# Patient Record
Sex: Female | Born: 1955
Health system: Southern US, Community
[De-identification: ages and names within clinical notes are randomized; demographics above are authoritative.]

## PROBLEM LIST (undated history)

## (undated) DIAGNOSIS — T8859XA Other complications of anesthesia, initial encounter: Secondary | ICD-10-CM

## (undated) DIAGNOSIS — I10 Essential (primary) hypertension: Secondary | ICD-10-CM

## (undated) DIAGNOSIS — G709 Myoneural disorder, unspecified: Secondary | ICD-10-CM

## (undated) DIAGNOSIS — Z8719 Personal history of other diseases of the digestive system: Secondary | ICD-10-CM

## (undated) DIAGNOSIS — N189 Chronic kidney disease, unspecified: Secondary | ICD-10-CM

## (undated) DIAGNOSIS — C569 Malignant neoplasm of unspecified ovary: Secondary | ICD-10-CM

## (undated) DIAGNOSIS — R112 Nausea with vomiting, unspecified: Secondary | ICD-10-CM

## (undated) DIAGNOSIS — K219 Gastro-esophageal reflux disease without esophagitis: Secondary | ICD-10-CM

## (undated) DIAGNOSIS — Z9221 Personal history of antineoplastic chemotherapy: Secondary | ICD-10-CM

## (undated) DIAGNOSIS — Z9889 Other specified postprocedural states: Secondary | ICD-10-CM

## (undated) DIAGNOSIS — Z87442 Personal history of urinary calculi: Secondary | ICD-10-CM

## (undated) DIAGNOSIS — T4145XA Adverse effect of unspecified anesthetic, initial encounter: Secondary | ICD-10-CM

## (undated) HISTORY — DX: Malignant neoplasm of unspecified ovary: C56.9

## (undated) HISTORY — PX: HIATAL HERNIA REPAIR: SHX195

## (undated) HISTORY — DX: Essential (primary) hypertension: I10

## (undated) HISTORY — PX: OTHER SURGICAL HISTORY: SHX169

---

## 1997-08-08 ENCOUNTER — Ambulatory Visit (HOSPITAL_COMMUNITY): Admission: RE | Admit: 1997-08-08 | Discharge: 1997-08-08 | Payer: Self-pay | Admitting: Gastroenterology

## 1999-08-30 ENCOUNTER — Encounter: Admission: RE | Admit: 1999-08-30 | Discharge: 1999-08-30 | Payer: Self-pay | Admitting: General Surgery

## 1999-08-30 ENCOUNTER — Encounter: Payer: Self-pay | Admitting: General Surgery

## 2002-02-11 ENCOUNTER — Ambulatory Visit (HOSPITAL_BASED_OUTPATIENT_CLINIC_OR_DEPARTMENT_OTHER): Admission: RE | Admit: 2002-02-11 | Discharge: 2002-02-11 | Payer: Self-pay | Admitting: Oral Surgery

## 2005-02-02 ENCOUNTER — Emergency Department (HOSPITAL_COMMUNITY): Admission: EM | Admit: 2005-02-02 | Discharge: 2005-02-02 | Payer: Self-pay | Admitting: Family Medicine

## 2005-08-23 ENCOUNTER — Ambulatory Visit (HOSPITAL_BASED_OUTPATIENT_CLINIC_OR_DEPARTMENT_OTHER): Admission: RE | Admit: 2005-08-23 | Discharge: 2005-08-23 | Payer: Self-pay | Admitting: Orthopedic Surgery

## 2005-08-23 ENCOUNTER — Encounter (INDEPENDENT_AMBULATORY_CARE_PROVIDER_SITE_OTHER): Payer: Self-pay | Admitting: Specialist

## 2009-01-05 ENCOUNTER — Ambulatory Visit (HOSPITAL_BASED_OUTPATIENT_CLINIC_OR_DEPARTMENT_OTHER): Admission: RE | Admit: 2009-01-05 | Discharge: 2009-01-05 | Payer: Self-pay | Admitting: Orthopedic Surgery

## 2009-01-07 ENCOUNTER — Emergency Department (HOSPITAL_COMMUNITY): Admission: EM | Admit: 2009-01-07 | Discharge: 2009-01-07 | Payer: Self-pay | Admitting: Emergency Medicine

## 2009-03-01 ENCOUNTER — Emergency Department (HOSPITAL_BASED_OUTPATIENT_CLINIC_OR_DEPARTMENT_OTHER): Admission: EM | Admit: 2009-03-01 | Discharge: 2009-03-01 | Payer: Self-pay | Admitting: Emergency Medicine

## 2010-07-27 LAB — POCT HEMOGLOBIN-HEMACUE: Hemoglobin: 14.7 g/dL (ref 12.0–15.0)

## 2010-09-07 NOTE — Op Note (Signed)
[name], Holly [name]                           ACCOUNT NO.:  [id number]   MEDICAL RECORD NO.:  [id number]                   PATIENT TYPE:  AMB   LOCATION:  DSC                                  FACILITY:  [facility]   PHYSICIAN:  Dora Sims, M.D.               DATE OF BIRTH:  [birth year]   DATE OF PROCEDURE:  02/11/2002  DATE OF DISCHARGE:  02/11/2002                                 OPERATIVE REPORT   PREOPERATIVE DIAGNOSES:  Left temporomandibular joint (TMJ) disk  dislocation.   POSTOPERATIVE DIAGNOSES:  Left temporomandibular joint (TMJ) disk  dislocation.   OPERATION PERFORMED:  Left TMJ arthroscopy, lysis and lavage.   SURGEON:  Dora Sims, D.D.S., M.D.   ANESTHESIA:  General endotracheal tube.   INDICATIONS FOR PROCEDURE:  This 55 year old lady was initially seen in my  office for evaluation of left temporomandibular joint pain.  She was placed  on conservative therapy and had persistent open lock event.  She underwent  an arthrocentesis in the office and approximately nine weeks after that  intervention, she had a series of open lock events.  The patient was  consulted and decision was made to bring the patient to the operating room  for evaluation under arthroscopic examination.   DESCRIPTION OF PROCEDURE:  The patient was maintained n.p.o. the night  before surgery, brought to the operating room, placed in the supine  position.  All anesthesia monitors were found to be working appropriately.  The patient was placed in supine position and all pressure points were  adequately padded.  She was nasotracheally intubated with minimal  difficulty.  This was confirmed by clear bilateral breath sounds as well as  positive end tidal CO2.  Once this was done, the left side of the patient's  face was marked, prepped and draped in the normal sterile fashion.  An entry  port was marked approximately 10 cc anterior to the central part of the  tragus that corresponded to the  lateral canthus of the eye and then a second  port for the instrumentation, approximately 7 mm toward the lateral canthus.  Once the joint space was entered, exploration was done.  There was found to  be a mild to moderate degree of inflammatory synovitis.  There was mobility  of the mandible and good mobilization of the articular disk.  There were no  perforations in the disk and all adhesions were lysed.  Joint space was  lavaged with copious amounts of normal saline irrigation.  Due to some  technical difficulties with the arthroscopy set and the cannulas, a synovial  shaver was elected not to be used during this procedure.  The patient had  excellent mobility of the joint and the disk was adequately freed up with  good mobilization.  Two 6-0 Prolene sutures were placed at each stab  incision and a Band-Aid was placed over both entry ports.  The patient  tolerated the procedure well.  Minimal blood loss.  No drains were placed.  Nothing was sent for pathology.  The patient will be given a Therabite  physical therapy device in order to work on her range of motion,  postoperatively and will be seen in my office on a regular basis until  stabilization of her left temporomandibular joint.                                                 Dora Sims, M.D.   RJR/MEDQ  D:  02/18/2002  T:  02/18/2002  Job:  [id number]

## 2010-09-07 NOTE — Op Note (Signed)
NAMEZINEB, GLADE                 ACCOUNT NO.:  [id number]   MEDICAL RECORD NO.:  [id number]          PATIENT TYPE:  AMB   LOCATION:  DSC                          FACILITY:  [facility]   PHYSICIAN:  Katy Fitch. Sypher, M.D. DATE OF BIRTH:  30-Apr-1955   DATE OF PROCEDURE:  08/23/2005  DATE OF DISCHARGE:                                 OPERATIVE REPORT   PREOPERATIVE DIAGNOSIS:  Painful mass right ring finger distal  interphalangeal flexion crease ulnar aspect.   POSTOPERATIVE DIAGNOSIS:  Question thrombosed varicose vein versus vascular  tumor of pulp/distal interphalangeal flexion crease region of right ring  finger.   OPERATIONS:  Excisional biopsy of vascular mass.   OPERATIONS:  Dr. Josephine Igo.   ASSISTANT:  Annye Rusk, PA-C.   ANESTHESIA:  General sedation/2% lidocaine metacarpal head level block of  right ring finger. Supervising anesthesiologist Dr. Sampson Goon.   INDICATIONS:  Holly [name] is a 55 year old woman employed at [employer].   She was referred by her family physician, Dr. Manus Gunning for evaluation and  management of a mass on the volar aspect of her right ring finger that was  painful right at the DIP flexion crease.   Clinical examination suggested a possible flexor sheath ganglion versus  other predicament.   She requested excision.   After informed consent, she is brought to the operating room at this time.   Re-examination of her finger in the holding area revealed that the mass  appeared to be somewhat smaller but was still quite painful.  She requested  that we proceed.   After informed consent, she is brought to the operating room at this time.   PROCEDURE:  Skylie Hiott is brought to the operating room and placed in  supine position on the operating table.   Following informed consent by Dr. Sampson Goon, Diprivan anesthesia was  provided followed by application of a 2% lidocaine metacarpal head and  flexor sheath block of the right ring  finger.   The right arm was then prepped with Betadine soap and solution, and  sterilely draped.   The right ring finger was then exsanguinated with a gauze wrap and a 1/2-  inch Penrose drain was placed over the proximal phalangeal segment as a  digital tourniquet.   The procedure commenced with a Brunner type incision splitting the proximal  pulp and extending into an ulnar direction at the DIP flexion crease.   The subcutaneous tissue was carefully divided immediately revealing a  purplish black mass that measured approximately 2 x 3 mm.  This appeared to  be growing on the venous plexus of the pulp superficial to the flexor  sheath.   This was circumferentially dissected and removed with a margin of normal-  appearing vasculature.   The mass was passed off in formalin for pathologic evaluation.   The wound was then irrigated and repaired with interrupted suture of 5-0  nylon.   A finger dressing was applied with Xeroflo sterile gauze and Coban.  There  were no apparent complications.   For aftercare, Ms. Criscuolo is provided a prescription Darvocet-N  100 1 p.o. q  4-6 hours p.r.n. pain, 20 tablets without refill.      Katy Fitch Sypher, M.D.  Electronically Signed     RVS/MEDQ  D:  08/23/2005  T:  08/23/2005  Job:  [id number]

## 2011-03-01 ENCOUNTER — Other Ambulatory Visit: Payer: Self-pay | Admitting: Sports Medicine

## 2011-03-01 DIAGNOSIS — S63601A Unspecified sprain of right thumb, initial encounter: Secondary | ICD-10-CM

## 2011-03-11 ENCOUNTER — Ambulatory Visit
Admission: RE | Admit: 2011-03-11 | Discharge: 2011-03-11 | Disposition: A | Discharge status: Home / Self Care | Payer: 59 | Source: Ambulatory Visit | Attending: Sports Medicine | Admitting: Sports Medicine

## 2011-03-11 DIAGNOSIS — S63601A Unspecified sprain of right thumb, initial encounter: Secondary | ICD-10-CM

## 2011-03-11 MED ORDER — IOHEXOL 180 MG/ML  SOLN
1.0000 mL | Freq: Once | INTRAMUSCULAR | Status: AC | PRN
  Administered 2011-03-11: 1 mL via INTRAVENOUS

## 2014-08-25 ENCOUNTER — Encounter (HOSPITAL_COMMUNITY): Payer: Self-pay | Admitting: Emergency Medicine

## 2014-08-25 ENCOUNTER — Emergency Department (HOSPITAL_COMMUNITY)
Admission: EM | Admit: 2014-08-25 | Discharge: 2014-08-25 | Disposition: A | Discharge status: Home / Self Care | Payer: 59 | Source: Home / Self Care

## 2014-08-25 DIAGNOSIS — H6091 Unspecified otitis externa, right ear: Secondary | ICD-10-CM

## 2014-08-25 MED ORDER — CIPROFLOXACIN-DEXAMETHASONE 0.3-0.1 % OT SUSP: 4.0000 [drp] | Freq: Two times a day (BID) | OTIC | Status: DC

## 2014-08-25 NOTE — Discharge Instructions (Signed)
Your ear pain is likely from irritation and possible infection of the external ear canal. Some of this discomfort from persistent wax buildup in the ear. Please start the antibiotic drops as prescribed. You can also consider using a one-to-one mixture of hydrogen peroxide and water to clean out her ears on a regular basis.

## 2014-08-25 NOTE — ED Notes (Signed)
Pt states that her right ear has been hurting since this morning and she states that last time her ear was this painful she had a ear infection

## 2014-08-25 NOTE — ED Provider Notes (Signed)
CSN: [id number]     Arrival date & time 08/25/14  1311 History   None    Chief Complaint  Patient presents with  . Otalgia   (Consider location/radiation/quality/duration/timing/severity/associated sxs/prior Treatment) HPI  R ear pain. Started this morning when pt woke from bed. Dull ache. Unusual for pt to have these symptoms. Uses debrox regularly for cerumen impaction. Tylenol w/ only 2 hrs of relief. Symptoms are constant and getting worse. Denies fevers, hearing loss, headache, nuchal rigidity, rash, nausea, vomiting, chest pain, shortness breath, palpitations, rash.   History reviewed. No pertinent past medical history. History reviewed. No pertinent past surgical history. History reviewed. No pertinent family history. History  Substance Use Topics  . Smoking status: Never Smoker   . Smokeless tobacco: Not on file  . Alcohol Use: No   OB History    No data available     Review of Systems Per HPI with all other pertinent systems negative.   Allergies  Sudafed  Home Medications   Prior to Admission medications   Medication Sig Start Date End Date Taking? Authorizing Provider  ciprofloxacin-dexamethasone (CIPRODEX) otic suspension Place 4 drops into the right ear 2 (two) times daily. 08/25/14   Waldemar Dickens, MD   BP 130/84 mmHg  Pulse 63  Temp(Src) 98.2 F (36.8 C) (Oral)  Resp 14  SpO2 96% Physical Exam Physical Exam  Constitutional: oriented to person, place, and time. appears well-developed and well-nourished. No distress.  HENT:  Right and left TMs normal. Right external canal with substantial amount of cerumen throughout the canal and canal erythema from the 7 to 11:00 positions. Head: Normocephalic and atraumatic.  Eyes: EOMI. PERRL.  Neck: Normal range of motion.  Cardiovascular: RRR, no m/r/g, 2+ distal pulses,  Pulmonary/Chest: Effort normal and breath sounds normal. No respiratory distress.  Abdominal: Soft. Bowel sounds are normal. NonTTP, no  distension.  Musculoskeletal: Normal range of motion. Non ttp, no effusion.  Neurological: alert and oriented to person, place, and time.  Skin: Skin is warm. No rash noted. non diaphoretic.  Psychiatric: normal mood and affect. behavior is normal. Judgment and thought content normal.    ED Course  Procedures (including critical care time) Labs Review Labs Reviewed - No data to display  Imaging Review No results found.   MDM   1. Otitis externa, right    Ciprodex, ibuprofen.  Waldemar Dickens, MD 08/25/14 [phone]

## 2016-04-22 HISTORY — PX: ABDOMINAL HYSTERECTOMY: SHX81

## 2016-08-09 ENCOUNTER — Other Ambulatory Visit (HOSPITAL_COMMUNITY): Payer: Self-pay | Admitting: Emergency Medicine

## 2016-08-12 ENCOUNTER — Ambulatory Visit (HOSPITAL_COMMUNITY)
Admission: EM | Admit: 2016-08-12 | Discharge: 2016-08-12 | Disposition: A | Discharge status: Home / Self Care | Payer: 59 | Attending: Family Medicine | Admitting: Family Medicine

## 2016-08-12 ENCOUNTER — Ambulatory Visit (INDEPENDENT_AMBULATORY_CARE_PROVIDER_SITE_OTHER): Payer: 59

## 2016-08-12 ENCOUNTER — Encounter (HOSPITAL_COMMUNITY): Payer: Self-pay | Admitting: Emergency Medicine

## 2016-08-12 DIAGNOSIS — K5900 Constipation, unspecified: Secondary | ICD-10-CM

## 2016-08-12 DIAGNOSIS — R252 Cramp and spasm: Secondary | ICD-10-CM | POA: Diagnosis not present

## 2016-08-12 DIAGNOSIS — R1030 Lower abdominal pain, unspecified: Secondary | ICD-10-CM | POA: Diagnosis not present

## 2016-08-12 LAB — POCT URINALYSIS DIP (DEVICE)
Bilirubin Urine: NEGATIVE
Glucose, UA: NEGATIVE mg/dL
Ketones, ur: 15 mg/dL — AB
Leukocytes, UA: NEGATIVE
NITRITE: NEGATIVE
PROTEIN: NEGATIVE mg/dL
Specific Gravity, Urine: 1.015 (ref 1.005–1.030)
UROBILINOGEN UA: 0.2 mg/dL (ref 0.0–1.0)
pH: 6 (ref 5.0–8.0)

## 2016-08-12 MED ORDER — POLYETHYLENE GLYCOL 3350 17 GM/SCOOP PO POWD
1.0000 | Freq: Once | ORAL | 0 refills | Status: AC
Start: 2016-08-12 — End: 2016-08-12

## 2016-08-12 NOTE — ED Triage Notes (Signed)
The patient presented to the [facility] with a complaint of abdominal cramping x 1 day.

## 2016-08-12 NOTE — ED Provider Notes (Signed)
[facility]    CSN: [id number] Arrival date & time: 08/12/16  1843     History   Chief Complaint Chief Complaint  Patient presents with  . Abdominal Cramping    HPI Holly [name] is a 61 y.o. female.   The patient presented to the [facility] with a complaint of abdominal cramping x 1 day.  Patient has had some constipation type symptoms with bloating and decreased frequency of bowel movements for couple weeks. She's had migrating discomfort in her abdomen during this time. Today the pain was a little bit worse all day although it has fluctuated.  Patient's had no urinary symptoms. She's had no vaginal bleeding. She's had no fever. The only abdominal procedure patient has had involved the hiatal hernia correction years ago      History reviewed. No pertinent past medical history.  There are no active problems to display for this patient.   History reviewed. No pertinent surgical history.  OB History    No data available       Home Medications    Prior to Admission medications   Not on File    Family History History reviewed. No pertinent family history.  Social History Social History  Substance Use Topics  . Smoking status: Never Smoker  . Smokeless tobacco: Not on file  . Alcohol use No     Allergies   Sudafed [pseudoephedrine hcl]   Review of Systems Review of Systems  HENT: Negative.   Respiratory: Negative.   Cardiovascular: Negative.   Gastrointestinal: Positive for abdominal distention and abdominal pain.  Genitourinary: Negative.   All other systems reviewed and are negative.    Physical Exam Triage Vital Signs ED Triage Vitals  Enc Vitals Group     BP 08/12/16 1848 (!) 179/59     Pulse Rate 08/12/16 1848 95     Resp 08/12/16 1848 20     Temp 08/12/16 1848 98.8 F (37.1 C)     Temp Source 08/12/16 1848 Oral     SpO2 08/12/16 1848 97 %     Weight --      Height --      Head Circumference --      Peak Flow --      Pain  Score 08/12/16 1846 3     Pain Loc --      Pain Edu? --      Excl. in [place]? --    No data found.   Updated Vital Signs BP (!) 179/59 (BP Location: Right Wrist)   Pulse 95   Temp 98.8 F (37.1 C) (Oral)   Resp 20   SpO2 97%    Physical Exam  Constitutional: She is oriented to person, place, and time. She appears well-developed and well-nourished.  HENT:  Head: Normocephalic.  Right Ear: External ear normal.  Left Ear: External ear normal.  Mouth/Throat: Oropharynx is clear and moist.  Eyes: Conjunctivae and EOM are normal. Pupils are equal, round, and reactive to light.  Neck: Normal range of motion. Neck supple.  Pulmonary/Chest: Effort normal.  Abdominal: Soft. There is tenderness.  Mild suprapubic tenderness with deep palpation No masses or HSM No guarding or rebound  Musculoskeletal: Normal range of motion.  Neurological: She is alert and oriented to person, place, and time.  Skin: Skin is warm and dry.  Nursing note and vitals reviewed.    UC Treatments / Results  Labs (all labs ordered are listed, but only abnormal results are displayed) Labs  Reviewed  POCT URINALYSIS DIP (DEVICE) - Abnormal; Notable for the following:       Result Value   Ketones, ur 15 (*)    Hgb urine dipstick MODERATE (*)    All other components within normal limits    EKG  EKG Interpretation None       Radiology Dg Abd 1 View  Result Date: 08/12/2016 CLINICAL DATA:  Lower abdominal pain and cramping for the past 3 weeks. History of hernia repair. EXAM: ABDOMEN - 1 VIEW COMPARISON:  None. FINDINGS: Moderate colonic stool burden without definite evidence of enteric obstruction. No supine evidence of pneumoperitoneum. No definite pneumatosis or portal venous gas. Limited visualization of the lower thorax is normal. Punctate phleboliths overlie the lower pelvis bilaterally. Otherwise, no definitive abnormal intra-abdominal calcifications. No definite acute osseus abnormalities.  IMPRESSION: Moderate colonic stool burden without evidence of enteric obstruction. Electronically Signed   By: Sandi Mariscal M.D.   On: 08/12/2016 20:15    Procedures Procedures (including critical care time)  Medications Ordered in UC Medications - No data to display   Initial Impression / Assessment and Plan / UC Course  I have reviewed the triage vital signs and the nursing notes.  Pertinent labs & imaging results that were available during my care of the patient were reviewed by me and considered in my medical decision making (see chart for details).     Final Clinical Impressions(s) / UC Diagnoses   Final diagnoses:  None    New Prescriptions New Prescriptions   No medications on file     Robyn Haber, MD 08/12/16 2050

## 2016-11-07 ENCOUNTER — Other Ambulatory Visit: Payer: Self-pay | Admitting: Obstetrics and Gynecology

## 2016-11-07 ENCOUNTER — Other Ambulatory Visit (HOSPITAL_COMMUNITY)
Admission: RE | Admit: 2016-11-07 | Discharge: 2016-11-07 | Disposition: A | Discharge status: Home / Self Care | Payer: 59 | Source: Ambulatory Visit | Attending: Obstetrics and Gynecology | Admitting: Obstetrics and Gynecology

## 2016-11-07 DIAGNOSIS — Z124 Encounter for screening for malignant neoplasm of cervix: Secondary | ICD-10-CM | POA: Insufficient documentation

## 2016-11-11 LAB — CYTOLOGY - PAP
ADEQUACY: ABSENT
DIAGNOSIS: NEGATIVE
HPV: NOT DETECTED

## 2016-12-04 ENCOUNTER — Other Ambulatory Visit: Payer: Self-pay | Admitting: Obstetrics and Gynecology

## 2016-12-04 DIAGNOSIS — R1900 Intra-abdominal and pelvic swelling, mass and lump, unspecified site: Secondary | ICD-10-CM

## 2016-12-05 ENCOUNTER — Ambulatory Visit
Admission: RE | Admit: 2016-12-05 | Discharge: 2016-12-05 | Disposition: A | Discharge status: Home / Self Care | Payer: 59 | Source: Ambulatory Visit | Attending: Obstetrics and Gynecology | Admitting: Obstetrics and Gynecology

## 2016-12-05 ENCOUNTER — Other Ambulatory Visit: Payer: Self-pay | Admitting: Obstetrics and Gynecology

## 2016-12-05 DIAGNOSIS — R1900 Intra-abdominal and pelvic swelling, mass and lump, unspecified site: Secondary | ICD-10-CM

## 2016-12-10 ENCOUNTER — Telehealth: Payer: Self-pay | Admitting: Gynecologic Oncology

## 2016-12-10 ENCOUNTER — Encounter: Payer: Self-pay | Admitting: Gynecologic Oncology

## 2016-12-10 NOTE — Telephone Encounter (Signed)
Appt has been scheduled for the pt to see Dr. Denman George on 8/27 at 1045am. Pt agreed to the appt date and time. Letter mailed to the pt and faxed to the referring.

## 2016-12-16 ENCOUNTER — Ambulatory Visit: Payer: 59 | Attending: Gynecologic Oncology | Admitting: Gynecologic Oncology

## 2016-12-16 ENCOUNTER — Encounter: Payer: Self-pay | Admitting: Gynecologic Oncology

## 2016-12-16 VITALS — BP 180/88 | HR 110 | Temp 98.3°F | Resp 18 | Ht 65.0 in | Wt 183.2 lb

## 2016-12-16 DIAGNOSIS — R188 Other ascites: Secondary | ICD-10-CM

## 2016-12-16 DIAGNOSIS — R1900 Intra-abdominal and pelvic swelling, mass and lump, unspecified site: Secondary | ICD-10-CM | POA: Diagnosis not present

## 2016-12-16 DIAGNOSIS — Z8249 Family history of ischemic heart disease and other diseases of the circulatory system: Secondary | ICD-10-CM | POA: Insufficient documentation

## 2016-12-16 DIAGNOSIS — Z823 Family history of stroke: Secondary | ICD-10-CM | POA: Insufficient documentation

## 2016-12-16 DIAGNOSIS — Z833 Family history of diabetes mellitus: Secondary | ICD-10-CM | POA: Diagnosis not present

## 2016-12-16 NOTE — Progress Notes (Signed)
Consult Note: Gyn-Onc  Consult was requested by Dr. Simona Huh for the evaluation of Holly [name] 61 y.o. female  CC:  Chief Complaint  Patient presents with  . Pelvic mass    Assessment/Plan:  Holly [name]  is a 61 y.o.  year old with a 20+ cm pelvic mass of unclear etiology in the setting of low volume ascites.  I reviewed the MRI images with Holly [name] and her family. The mass is solid, and with the small volume ascites this is concerning for malignancy, though, it does not appear consistent with typical ovarian cancer presentation. Additionally on the MRI images there is concern that this may be emanating from the uterine fundus. I speculate that it could be a benign or malignant soft tissue tumor.  I discussed that it is possible that the rectum may be involved (though not apparently on today's exam). If this is the case, there is a possibility that she will require rectal resection with either reanastamosis or stoma formation. I discussed that she may require surgical management of GU or small intestinal structures also pending surgical findings. This surgery would involve substantial surgical risks including  bleeding, infection, damage to internal organs (such as bladder,ureters, bowels), blood clot, reoperation and rehospitalization.  I am recommending exploratory laparotomy, TAH, BSO, radical tumor debulking, possible bowel resection or colostomy. We will draw tumor markers today.  HPI: Holly [name] is a 61 year old parous woman who is seen in consultation at the request of Dr Simona Huh for a large pelvic mass. The patient has 3 months of vague symptoms of fullness in the pelvis, urinary frequency and difficulty with defecation. She denies vaginal bleeding or rectal bleeding.  She was seen in an urgen care in July, 2018 and a TVUS was performed on 11/13/16 which showed a large hypoechoic lobular mass seen posterior to the uterus measuring 18.2x16.1x11.8cm, blood flow was seen along  the periphery of the mass. It was considered to be likely to be a fibroid vs pelvic mass vs ovarian mass. Neither ovary was removed. Moderate amount of free fluid in the pelvis. THe uterus measures 4x10x4cm. The endometrium is 28mm.  An MRI was performed on 12/05/16 which showed a 13.9x11.9x18.3cm T1 hypointense central pelvic mass favoring to arise from the right ovary. No peritoneal metastases were identified in the pelvis. They favor a solid ovarian neoplasm such as a brenner tumor or fibroma.  The patient is otherwise quite helathy. She has had 2 prior vaginal deliveries and a nissan fundoplication.   Current Meds:  Outpatient Encounter Prescriptions as of 12/16/2016  Medication Sig  . acetaminophen (TYLENOL) 500 MG tablet Take 500-1,000 mg by mouth every 8 (eight) hours as needed.  . naproxen sodium (ANAPROX) 220 MG tablet Take 220 mg by mouth 2 (two) times daily as needed.   No facility-administered encounter medications on file as of 12/16/2016.     Allergy:  Allergies  Allergen Reactions  . Dilaudid [Hydromorphone Hcl] Nausea And Vomiting  . Sudafed [Pseudoephedrine Hcl]     Social Hx:   Social History   Social History  . Marital status: Married    Spouse name: N/A  . Number of children: N/A  . Years of education: N/A   Occupational History  . Not on file.   Social History Main Topics  . Smoking status: Never Smoker  . Smokeless tobacco: Never Used  . Alcohol use No  . Drug use: No  . Sexual activity: Not on file   Other  Topics Concern  . Not on file   Social History Narrative  . No narrative on file    Past Surgical Hx:  Past Surgical History:  Procedure Laterality Date  . ganglion cyst removed    . Hital Hernia    . Nissan Fundoplication      Past Medical Hx: History reviewed. No pertinent past medical history.  Past Gynecological History:  SVD x 2 No LMP recorded. Patient is postmenopausal.  Family Hx:  Family History  Problem Relation Age of Onset   . Diabetes Mother   . Diabetes Father   . Hypertension Father   . Stroke Father     Review of Systems:  Constitutional  Feels well,    ENT Normal appearing ears and nares bilaterally Skin/Breast  No rash, sores, jaundice, itching, dryness Cardiovascular  No chest pain, shortness of breath, or edema  Pulmonary  No cough or wheeze.  Gastro Intestinal  No nausea, vomitting, or diarrhoea. No bright red blood per rectum, no abdominal pain, change in bowel movement, or constipation.  Genito Urinary  +frequency, no urgency, dysuria, bleeding Musculo Skeletal  No myalgia, arthralgia, joint swelling or pain  Neurologic  No weakness, numbness, change in gait,  Psychology  No depression, anxiety, insomnia.   Vitals:  Blood pressure (!) 180/88, pulse (!) 110, temperature 98.3 F (36.8 C), resp. rate 18, height 5\' 5"  (1.651 m), weight 183 lb 3.2 oz (83.1 kg), SpO2 98 %.  Physical Exam: WD in NAD Neck  Supple NROM, without any enlargements.  Lymph Node Survey No cervical supraclavicular or inguinal adenopathy Cardiovascular  Pulse normal rate, regularity and rhythm. S1 and S2 normal.  Lungs  Clear to auscultation bilateraly, without wheezes/crackles/rhonchi. Good air movement.  Skin  No rash/lesions/breakdown  Psychiatry  Alert and oriented to person, place, and time  Abdomen  Normoactive bowel sounds, abdomen soft, non-tender and nonobese without evidence of hernia. Palpable mobile mass in lower abdomen below umbilicus. Back No CVA tenderness Genito Urinary  Vulva/vagina: Normal external female genitalia.   No lesions. No discharge or bleeding.  Bladder/urethra:  No lesions or masses, well supported bladder  Vagina: normal, nodularity appreciated posterior to uterus in cul de sac which is fleshy rather than hard but somewhat irregular.  Cervix: Normal appearing, no lesions.  Uterus:  Small, mobile, no parametrial involvement or nodularity.  Adnexa: large 20+ cm minimally  mobile mass filling posterior pelvis behind uterus from sidewall to sidewall Rectal  Nodularity on surface of mass appreciated but does not feel invasive into rectum and no separate peritoneal nodularity. Extremities  No bilateral cyanosis, clubbing or edema.   Donaciano Eva, MD  12/16/2016, 5:56 PM

## 2016-12-16 NOTE — Patient Instructions (Signed)
Preparing for your Surgery  Plan for surgery on January 16, 2017 with Dr. Everitt Amber at [facility] will be scheduled for an exploratory laparotomy, total abdominal hysterectomy, bilateral salpingo-oophorectomy, possible radical tumor debulking, possible colostomy.  Pre-operative Testing -You will receive a phone call from presurgical testing at [facility] to arrange for a pre-operative testing appointment before your surgery.  This appointment normally occurs one to two weeks before your scheduled surgery.   -Bring your insurance card, copy of an advanced directive if applicable, medication list  -At that visit, you will be asked to sign a consent for a possible blood transfusion in case a transfusion becomes necessary during surgery.  The need for a blood transfusion is rare but having consent is a necessary part of your care.     -You should not be taking blood thinners or aspirin at least ten days prior to surgery unless instructed by your surgeon.  Day Before Surgery at [employer] will be asked to take in a light diet the day before surgery.  Avoid carbonated beverages.  You will be advised to have nothing to eat or drink after midnight the evening before.     Eat a light diet the day before surgery.  Examples including soups, broths, toast, yogurt, mashed potatoes.  Things to avoid include carbonated beverages (fizzy beverages), raw fruits and raw vegetables, or beans.    If your bowels are filled with gas, your surgeon will have difficulty visualizing your pelvic organs which increases your surgical risks.  Starting at 4pm the day before surgery, begin drinking two bottles of magnesium citrate and take in only clear liquids until midnight.  Your role in recovery Your role is to become active as soon as directed by your doctor, while still giving yourself time to heal.  Rest when you feel tired. You will be asked to do the following in order to  speed your recovery:  - Cough and breathe deeply. This helps toclear and expand your lungs and can prevent pneumonia. You may be given a spirometer to practice deep breathing. A staff member will show you how to use the spirometer. - Do mild physical activity. Walking or moving your legs help your circulation and body functions return to normal. A staff member will help you when you try to walk and will provide you with simple exercises. Do not try to get up or walk alone the first time. - Actively manage your pain. Managing your pain lets you move in comfort. We will ask you to rate your pain on a scale of zero to 10. It is your responsibility to tell your doctor or nurse where and how much you hurt so your pain can be treated.  Special Considerations -If you are diabetic, you may be placed on insulin after surgery to have closer control over your blood sugars to promote healing and recovery.  This does not mean that you will be discharged on insulin.  If applicable, your oral antidiabetics will be resumed when you are tolerating a solid diet.  -Your final pathology results from surgery should be available by the Friday after surgery and the results will be relayed to you when available.   Blood Transfusion Information WHAT IS A BLOOD TRANSFUSION? A transfusion is the replacement of blood or some of its parts. Blood is made up of multiple cells which provide different functions.  Red blood cells carry oxygen and are used for blood loss replacement.  White  blood cells fight against infection.  Platelets control bleeding.  Plasma helps clot blood.  Other blood products are available for specialized needs, such as hemophilia or other clotting disorders. BEFORE THE TRANSFUSION  Who gives blood for transfusions?   You may be able to donate blood to be used at a later date on yourself (autologous donation).  Relatives can be asked to donate blood. This is generally not any safer than if you  have received blood from a stranger. The same precautions are taken to ensure safety when a relative's blood is donated.  Healthy volunteers who are fully evaluated to make sure their blood is safe. This is blood bank blood. Transfusion therapy is the safest it has ever been in the practice of medicine. Before blood is taken from a donor, a complete history is taken to make sure that person has no history of diseases nor engages in risky social behavior (examples are intravenous drug use or sexual activity with multiple partners). The donor's travel history is screened to minimize risk of transmitting infections, such as malaria. The donated blood is tested for signs of infectious diseases, such as HIV and hepatitis. The blood is then tested to be sure it is compatible with you in order to minimize the chance of a transfusion reaction. If you or a relative donates blood, this is often done in anticipation of surgery and is not appropriate for emergency situations. It takes many days to process the donated blood. RISKS AND COMPLICATIONS Although transfusion therapy is very safe and saves many lives, the main dangers of transfusion include:   Getting an infectious disease.  Developing a transfusion reaction. This is an allergic reaction to something in the blood you were given. Every precaution is taken to prevent this. The decision to have a blood transfusion has been considered carefully by your caregiver before blood is given. Blood is not given unless the benefits outweigh the risks.

## 2016-12-18 NOTE — Progress Notes (Signed)
Per Zoila Shutter at OB/GYN Oncology the possibility of a colostomy is a small chance, patient is aware.  Asked Zoila Shutter if MD would like ostomy nurses to see patient at preop appt. Melissa Cross,NP stated for ostomy nurse to see patient prior to surgery.  Email sent to ostomy nurses.

## 2016-12-18 NOTE — Patient Instructions (Signed)
Holly [name]  12/18/2016   Your procedure is scheduled on: 12/26/2016    Report to [facility] Main  Entrance Take [place]  elevators to 3rd floor to  [place] at   [place] AM.     Call this number if you have problems the morning of surgery [phone]    Remember: ONLY 1 PERSON MAY GO WITH YOU TO SHORT STAY TO GET  READY MORNING OF YOUR SURGERY.  Do not eat food or drink liquids :After Midnight.             Eat a light diet the day before surgery.  Examples include: soups, broths, toast, yogurt and mashed potatoes.  Things to avoid include carbonated beverages, raw fruits and vegetables and beans.  AT 400pm the day before surgery begin drinking 2 bottles of Magnesium citrate.  At 400pm the day before surgery also begin a clear liquid diet.       Take these medicines the morning of surgery with A SIP OF WATER: none                                 You may not have any metal on your body including hair pins and              piercings  Do not wear jewelry, make-up, lotions, powders or perfumes, deodorant             Do not wear nail polish.  Do not shave  48 hours prior to surgery.                 Do not bring valuables to the hospital. [place].  Contacts, dentures or bridgework may not be worn into surgery.  Leave suitcase in the car. After surgery it may be brought to your room.        CLEAR LIQUID DIET   Foods Allowed                                                                     Foods Excluded  Coffee and tea, regular and decaf                             liquids that you cannot  Plain Jell-O in any flavor                                             see through such as: Fruit ices (not with fruit pulp)                                     milk, soups, orange juice  Iced Popsicles  All solid food Carbonated beverages, regular and diet                                     Cranberry, grape and apple juices Sports drinks like Gatorade Lightly seasoned clear broth or consume(fat free) Sugar, honey syrup  Sample Menu Breakfast                                Lunch                                     Supper Cranberry juice                    Beef broth                            Chicken broth Jell-O                                     Grape juice                           Apple juice Coffee or tea                        Jell-O                                      Popsicle                                                Coffee or tea                        Coffee or tea  _____________________________________________________________________                Please read over the following fact sheets you were given: _____________________________________________________________________             [facility] - Preparing for Surgery Before surgery, you can play an important role.  Because skin is not sterile, your skin needs to be as free of germs as possible.  You can reduce the number of germs on your skin by washing with CHG (chlorahexidine gluconate) soap before surgery.  CHG is an antiseptic cleaner which kills germs and bonds with the skin to continue killing germs even after washing. Please DO NOT use if you have an allergy to CHG or antibacterial soaps.  If your skin becomes reddened/irritated stop using the CHG and inform your nurse when you arrive at Short Stay. Do not shave (including legs and underarms) for at least 48 hours prior to the first CHG shower.  You may shave your face/neck. Please follow these instructions carefully:  1.  Shower with CHG Soap the night before surgery and the  morning of Surgery.  2.  If you choose to wash your hair, wash your  hair first as usual with your  normal  shampoo.  3.  After you shampoo, rinse your hair and body thoroughly to remove the  shampoo.                           4.  Use CHG as you would any other  liquid soap.  You can apply chg directly  to the skin and wash                       Gently with a scrungie or clean washcloth.  5.  Apply the CHG Soap to your body ONLY FROM THE NECK DOWN.   Do not use on face/ open                           Wound or open sores. Avoid contact with eyes, ears mouth and genitals (private parts).                       Wash face,  Genitals (private parts) with your normal soap.             6.  Wash thoroughly, paying special attention to the area where your surgery  will be performed.  7.  Thoroughly rinse your body with warm water from the neck down.  8.  DO NOT shower/wash with your normal soap after using and rinsing off  the CHG Soap.                9.  Pat yourself dry with a clean towel.            10.  Wear clean pajamas.            11.  Place clean sheets on your bed the night of your first shower and do not  sleep with pets. Day of Surgery : Do not apply any lotions/deodorants the morning of surgery.  Please wear clean clothes to the hospital/surgery center.  FAILURE TO FOLLOW THESE INSTRUCTIONS MAY RESULT IN THE CANCELLATION OF YOUR SURGERY PATIENT SIGNATURE_________________________________  NURSE SIGNATURE__________________________________  ________________________________________________________________________   Adam Phenix  An incentive spirometer is a tool that can help keep your lungs clear and active. This tool measures how well you are filling your lungs with each breath. Taking long deep breaths may help reverse or decrease the chance of developing breathing (pulmonary) problems (especially infection) following:  A long period of time when you are unable to move or be active. BEFORE THE PROCEDURE   If the spirometer includes an indicator to show your best effort, your nurse or respiratory therapist will set it to a desired goal.  If possible, sit up straight or lean slightly forward. Try not to slouch.  Hold the incentive  spirometer in an upright position. INSTRUCTIONS FOR USE  1. Sit on the edge of your bed if possible, or sit up as far as you can in bed or on a chair. 2. Hold the incentive spirometer in an upright position. 3. Breathe out normally. 4. Place the mouthpiece in your mouth and seal your lips tightly around it. 5. Breathe in slowly and as deeply as possible, raising the piston or the ball toward the top of the column. 6. Hold your breath for 3-5 seconds or for as long as possible. Allow the piston or ball to fall to the bottom of the column. 7.  Remove the mouthpiece from your mouth and breathe out normally. 8. Rest for a few seconds and repeat Steps 1 through 7 at least 10 times every 1-2 hours when you are awake. Take your time and take a few normal breaths between deep breaths. 9. The spirometer may include an indicator to show your best effort. Use the indicator as a goal to work toward during each repetition. 10. After each set of 10 deep breaths, practice coughing to be sure your lungs are clear. If you have an incision (the cut made at the time of surgery), support your incision when coughing by placing a pillow or rolled up towels firmly against it. Once you are able to get out of bed, walk around indoors and cough well. You may stop using the incentive spirometer when instructed by your caregiver.  RISKS AND COMPLICATIONS  Take your time so you do not get dizzy or light-headed.  If you are in pain, you may need to take or ask for pain medication before doing incentive spirometry. It is harder to take a deep breath if you are having pain. AFTER USE  Rest and breathe slowly and easily.  It can be helpful to keep track of a log of your progress. Your caregiver can provide you with a simple table to help with this. If you are using the spirometer at home, follow these instructions: [place] IF:   You are having difficultly using the spirometer.  You have trouble using the  spirometer as often as instructed.  Your pain medication is not giving enough relief while using the spirometer.  You develop fever of 100.5 F (38.1 C) or higher. SEEK IMMEDIATE MEDICAL CARE IF:   You cough up bloody sputum that had not been present before.  You develop fever of 102 F (38.9 C) or greater.  You develop worsening pain at or near the incision site. MAKE SURE YOU:   Understand these instructions.  Will watch your condition.  Will get help right away if you are not doing well or get worse. Document Released: 08/19/2006 Document Revised: 07/01/2011 Document Reviewed: 10/20/2006 ExitCare Patient Information 2014 ExitCare, [place].   ________________________________________________________________________  WHAT IS A BLOOD TRANSFUSION? Blood Transfusion Information  A transfusion is the replacement of blood or some of its parts. Blood is made up of multiple cells which provide different functions.  Red blood cells carry oxygen and are used for blood loss replacement.  White blood cells fight against infection.  Platelets control bleeding.  Plasma helps clot blood.  Other blood products are available for specialized needs, such as hemophilia or other clotting disorders. BEFORE THE TRANSFUSION  Who gives blood for transfusions?   Healthy volunteers who are fully evaluated to make sure their blood is safe. This is blood bank blood. Transfusion therapy is the safest it has ever been in the practice of medicine. Before blood is taken from a donor, a complete history is taken to make sure that person has no history of diseases nor engages in risky social behavior (examples are intravenous drug use or sexual activity with multiple partners). The donor's travel history is screened to minimize risk of transmitting infections, such as malaria. The donated blood is tested for signs of infectious diseases, such as HIV and hepatitis. The blood is then tested to be sure it is  compatible with you in order to minimize the chance of a transfusion reaction. If you or a relative donates blood, this is often done in anticipation  of surgery and is not appropriate for emergency situations. It takes many days to process the donated blood. RISKS AND COMPLICATIONS Although transfusion therapy is very safe and saves many lives, the main dangers of transfusion include:   Getting an infectious disease.  Developing a transfusion reaction. This is an allergic reaction to something in the blood you were given. Every precaution is taken to prevent this. The decision to have a blood transfusion has been considered carefully by your caregiver before blood is given. Blood is not given unless the benefits outweigh the risks. AFTER THE TRANSFUSION  Right after receiving a blood transfusion, you will usually feel much better and more energetic. This is especially true if your red blood cells have gotten low (anemic). The transfusion raises the level of the red blood cells which carry oxygen, and this usually causes an energy increase.  The nurse administering the transfusion will monitor you carefully for complications. HOME CARE INSTRUCTIONS  No special instructions are needed after a transfusion. You may find your energy is better. Speak with your caregiver about any limitations on activity for underlying diseases you may have. SEEK MEDICAL CARE IF:   Your condition is not improving after your transfusion.  You develop redness or irritation at the intravenous (IV) site. SEEK IMMEDIATE MEDICAL CARE IF:  Any of the following symptoms occur over the next 12 hours:  Shaking chills.  You have a temperature by mouth above 102 F (38.9 C), not controlled by medicine.  Chest, back, or muscle pain.  People around you feel you are not acting correctly or are confused.  Shortness of breath or difficulty breathing.  Dizziness and fainting.  You get a rash or develop hives.  You have  a decrease in urine output.  Your urine turns a dark color or changes to pink, red, or brown. Any of the following symptoms occur over the next 10 days:  You have a temperature by mouth above 102 F (38.9 C), not controlled by medicine.  Shortness of breath.  Weakness after normal activity.  The white part of the eye turns yellow (jaundice).  You have a decrease in the amount of urine or are urinating less often.  Your urine turns a dark color or changes to pink, red, or brown. Document Released: 04/05/2000 Document Revised: 07/01/2011 Document Reviewed: 11/23/2007 [facility] Patient Information 2014 [place], [place].  _______________________________________________________________________

## 2016-12-19 ENCOUNTER — Telehealth: Payer: Self-pay | Admitting: Gynecologic Oncology

## 2016-12-19 ENCOUNTER — Encounter (HOSPITAL_COMMUNITY): Payer: Self-pay

## 2016-12-19 ENCOUNTER — Encounter (HOSPITAL_COMMUNITY)
Admission: RE | Admit: 2016-12-19 | Discharge: 2016-12-19 | Disposition: A | Discharge status: Home / Self Care | Payer: 59 | Source: Ambulatory Visit | Attending: Gynecologic Oncology | Admitting: Gynecologic Oncology

## 2016-12-19 DIAGNOSIS — Z0183 Encounter for blood typing: Secondary | ICD-10-CM | POA: Insufficient documentation

## 2016-12-19 DIAGNOSIS — R1900 Intra-abdominal and pelvic swelling, mass and lump, unspecified site: Secondary | ICD-10-CM | POA: Insufficient documentation

## 2016-12-19 DIAGNOSIS — Z01812 Encounter for preprocedural laboratory examination: Secondary | ICD-10-CM | POA: Insufficient documentation

## 2016-12-19 HISTORY — DX: Other complications of anesthesia, initial encounter: T88.59XA

## 2016-12-19 HISTORY — DX: Other specified postprocedural states: R11.2

## 2016-12-19 HISTORY — DX: Adverse effect of unspecified anesthetic, initial encounter: T41.45XA

## 2016-12-19 HISTORY — DX: Gastro-esophageal reflux disease without esophagitis: K21.9

## 2016-12-19 HISTORY — DX: Personal history of other diseases of the digestive system: Z87.19

## 2016-12-19 HISTORY — DX: Other specified postprocedural states: Z98.890

## 2016-12-19 LAB — COMPREHENSIVE METABOLIC PANEL
ALBUMIN: 3.8 g/dL (ref 3.5–5.0)
ALK PHOS: 55 U/L (ref 38–126)
ALT: 8 U/L — ABNORMAL LOW (ref 14–54)
ANION GAP: 8 (ref 5–15)
AST: 18 U/L (ref 15–41)
BILIRUBIN TOTAL: 0.8 mg/dL (ref 0.3–1.2)
BUN: 10 mg/dL (ref 6–20)
CALCIUM: 9.4 mg/dL (ref 8.9–10.3)
CO2: 26 mmol/L (ref 22–32)
Chloride: 104 mmol/L (ref 101–111)
Creatinine, Ser: 0.92 mg/dL (ref 0.44–1.00)
GFR calc Af Amer: >60 mL/min (ref >60)
GLUCOSE: 104 mg/dL — AB (ref 65–99)
POTASSIUM: 4.9 mmol/L (ref 3.5–5.1)
Sodium: 138 mmol/L (ref 135–145)
TOTAL PROTEIN: 7 g/dL (ref 6.5–8.1)

## 2016-12-19 LAB — CBC
HEMATOCRIT: 40 % (ref 36.0–46.0)
HEMOGLOBIN: 13.1 g/dL (ref 12.0–15.0)
MCH: 29.4 pg (ref 26.0–34.0)
MCHC: 32.8 g/dL (ref 30.0–36.0)
MCV: 89.9 fL (ref 78.0–100.0)
Platelets: 303 10*3/uL (ref 150–400)
RBC: 4.45 MIL/uL (ref 3.87–5.11)
RDW: 13.6 % (ref 11.5–15.5)
WBC: 6.7 10*3/uL (ref 4.0–10.5)

## 2016-12-19 LAB — URINALYSIS, ROUTINE W REFLEX MICROSCOPIC
Bilirubin Urine: NEGATIVE
Glucose, UA: NEGATIVE mg/dL
KETONES UR: NEGATIVE mg/dL
LEUKOCYTES UA: NEGATIVE
Nitrite: NEGATIVE
PROTEIN: NEGATIVE mg/dL
Specific Gravity, Urine: 1.006 (ref 1.005–1.030)
WBC, UA: NONE SEEN WBC/hpf (ref 0–5)
pH: 6 (ref 5.0–8.0)

## 2016-12-19 LAB — ABO/RH: ABO/RH(D): A POS

## 2016-12-19 NOTE — Progress Notes (Signed)
Blood pressure at preop appt for surgery on 12/26/2016 was 190/90.  Patient voices no complaints.  Blood pressure at OB/Gyn Oncology appt was 180/88.  FYI.  Patient is scheduled for surgery on 12/26/16 with Dr Denman George.

## 2016-12-19 NOTE — Progress Notes (Signed)
U/a done 12/19/16 faxed via epic to Dr Denman George and Zoila Shutter.

## 2016-12-19 NOTE — Telephone Encounter (Signed)
Spoke with Anderson Malta at [facility] about patient's elevated BP.  She states they will get the patient in tomorrow in the office to evaluate her to hopefully have her BP stable for next week.

## 2016-12-19 NOTE — Consult Note (Signed)
[place] Nurse ostomy consult note  [place] Nurse requested for preoperative stoma site marking by Dr. Denman George per [name], NP.  Discussed surgical procedure and stoma creation with patient and family.  Explained role of the [place] nurse team.  Answered patient and family questions.   Examined patient sitting and standing in order to place the marking in the patient's visual field, away from any creases or abdominal contour issues and within the rectus muscle. Patient's abdomen is distended, but where she wears her underwear and waistband of her slacks is noted and considered.  Marked for colostomy in the LUQ  6cm to the left of the umbilicus and 6.5KP above  the umbilicus.  Patient's abdomen cleansed with CHG wipes at site markings, allowed to air dry prior to marking.Covered mark with thin film transparent dressing to preserve mark until date of surgery.   [place] Nurse team will be available after surgery for continued ostomy care and teaching should an ostomy be created intraoperatively. Please re-consult if that is the case. Thanks, Maudie Flakes, MSN, RN, [place], Arther Abbott  Pager# [phone]

## 2016-12-19 NOTE — Telephone Encounter (Signed)
Left message with staff requesting to speak with Ricka Burdock, PA or her nurse to discuss patient's BP prior to surgery next week.  Will await return call.

## 2016-12-26 ENCOUNTER — Inpatient Hospital Stay (HOSPITAL_COMMUNITY): Payer: 59 | Admitting: Anesthesiology

## 2016-12-26 ENCOUNTER — Encounter (HOSPITAL_COMMUNITY)
Admission: RE | Disposition: A | Discharge status: Home / Self Care | Payer: Self-pay | Source: Ambulatory Visit | Attending: Obstetrics & Gynecology

## 2016-12-26 ENCOUNTER — Encounter (HOSPITAL_COMMUNITY): Payer: Self-pay | Admitting: Anesthesiology

## 2016-12-26 ENCOUNTER — Inpatient Hospital Stay (HOSPITAL_COMMUNITY)
Admission: RE | Admit: 2016-12-26 | Discharge: 2016-12-28 | DRG: 737 | Disposition: A | Discharge status: Home / Self Care | Payer: 59 | Source: Ambulatory Visit | Attending: Obstetrics & Gynecology | Admitting: Obstetrics & Gynecology

## 2016-12-26 DIAGNOSIS — Z833 Family history of diabetes mellitus: Secondary | ICD-10-CM | POA: Diagnosis not present

## 2016-12-26 DIAGNOSIS — Z8249 Family history of ischemic heart disease and other diseases of the circulatory system: Secondary | ICD-10-CM

## 2016-12-26 DIAGNOSIS — C569 Malignant neoplasm of unspecified ovary: Secondary | ICD-10-CM

## 2016-12-26 DIAGNOSIS — Z823 Family history of stroke: Secondary | ICD-10-CM | POA: Diagnosis not present

## 2016-12-26 DIAGNOSIS — K449 Diaphragmatic hernia without obstruction or gangrene: Secondary | ICD-10-CM | POA: Diagnosis present

## 2016-12-26 DIAGNOSIS — Z888 Allergy status to other drugs, medicaments and biological substances status: Secondary | ICD-10-CM

## 2016-12-26 DIAGNOSIS — K219 Gastro-esophageal reflux disease without esophagitis: Secondary | ICD-10-CM | POA: Diagnosis present

## 2016-12-26 DIAGNOSIS — R1900 Intra-abdominal and pelvic swelling, mass and lump, unspecified site: Secondary | ICD-10-CM

## 2016-12-26 DIAGNOSIS — Z885 Allergy status to narcotic agent status: Secondary | ICD-10-CM

## 2016-12-26 DIAGNOSIS — C562 Malignant neoplasm of left ovary: Secondary | ICD-10-CM | POA: Diagnosis present

## 2016-12-26 DIAGNOSIS — I10 Essential (primary) hypertension: Secondary | ICD-10-CM | POA: Diagnosis present

## 2016-12-26 DIAGNOSIS — C786 Secondary malignant neoplasm of retroperitoneum and peritoneum: Secondary | ICD-10-CM | POA: Diagnosis present

## 2016-12-26 HISTORY — PX: DEBULKING: SHX6277

## 2016-12-26 HISTORY — PX: LAPAROTOMY: SHX154

## 2016-12-26 LAB — PREPARE RBC (CROSSMATCH)

## 2016-12-26 SURGERY — LAPAROTOMY, EXPLORATORY
Anesthesia: General

## 2016-12-26 MED ORDER — LACTATED RINGERS IV SOLN: INTRAVENOUS | Status: DC

## 2016-12-26 MED ORDER — MIDAZOLAM HCL 5 MG/5ML IJ SOLN
INTRAMUSCULAR | Status: DC | PRN
  Administered 2016-12-26: 2 mg via INTRAVENOUS

## 2016-12-26 MED ORDER — BUPIVACAINE LIPOSOME 1.3 % IJ SUSP
20.0000 mL | Freq: Once | INTRAMUSCULAR | Status: AC
Start: 2016-12-26 — End: 2016-12-26
  Administered 2016-12-26: 20 mL
  Filled 2016-12-26: qty 20

## 2016-12-26 MED ORDER — FENTANYL CITRATE (PF) 100 MCG/2ML IJ SOLN
INTRAMUSCULAR | Status: DC | PRN
  Administered 2016-12-26 (×3): 50 ug via INTRAVENOUS
  Administered 2016-12-26: 25 ug via INTRAVENOUS
  Administered 2016-12-26: 50 ug via INTRAVENOUS
  Administered 2016-12-26: 25 ug via INTRAVENOUS

## 2016-12-26 MED ORDER — ACETAMINOPHEN 500 MG PO TABS
1000.0000 mg | ORAL_TABLET | Freq: Four times a day (QID) | ORAL | Status: DC
  Administered 2016-12-26 – 2016-12-28 (×7): 1000 mg via ORAL
  Filled 2016-12-26 (×8): qty 2

## 2016-12-26 MED ORDER — MEPERIDINE HCL 50 MG/ML IJ SOLN: 6.2500 mg | INTRAMUSCULAR | Status: DC | PRN

## 2016-12-26 MED ORDER — TRAMADOL HCL 50 MG PO TABS
100.0000 mg | ORAL_TABLET | Freq: Four times a day (QID) | ORAL | Status: DC
  Administered 2016-12-26 – 2016-12-28 (×7): 100 mg via ORAL
  Filled 2016-12-26 (×8): qty 2

## 2016-12-26 MED ORDER — SODIUM CHLORIDE 0.9 % IJ SOLN
INTRAMUSCULAR | Status: AC
  Filled 2016-12-26: qty 50

## 2016-12-26 MED ORDER — HEMOSTATIC AGENTS (NO CHARGE) OPTIME
TOPICAL | Status: DC | PRN
  Administered 2016-12-26: 1 via TOPICAL

## 2016-12-26 MED ORDER — ONDANSETRON HCL 4 MG/2ML IJ SOLN
INTRAMUSCULAR | Status: DC | PRN
  Administered 2016-12-26: 4 mg via INTRAVENOUS

## 2016-12-26 MED ORDER — ROCURONIUM BROMIDE 50 MG/5ML IV SOSY
PREFILLED_SYRINGE | INTRAVENOUS | Status: AC
  Filled 2016-12-26: qty 5

## 2016-12-26 MED ORDER — BUPIVACAINE HCL 0.25 % IJ SOLN
INTRAMUSCULAR | Status: DC | PRN
Start: 2016-12-26 — End: 2016-12-26
  Administered 2016-12-26: 20 mL

## 2016-12-26 MED ORDER — ALBUMIN HUMAN 5 % IV SOLN
INTRAVENOUS | Status: DC | PRN
  Administered 2016-12-26: 13:00:00 via INTRAVENOUS

## 2016-12-26 MED ORDER — ALBUMIN HUMAN 5 % IV SOLN
INTRAVENOUS | Status: AC
  Filled 2016-12-26: qty 250

## 2016-12-26 MED ORDER — PREGABALIN 75 MG PO CAPS
75.0000 mg | ORAL_CAPSULE | Freq: Two times a day (BID) | ORAL | Status: DC
  Administered 2016-12-27 – 2016-12-28 (×3): 75 mg via ORAL
  Filled 2016-12-26 (×3): qty 1

## 2016-12-26 MED ORDER — MAGNESIUM HYDROXIDE 400 MG/5ML PO SUSP
15.0000 mL | Freq: Three times a day (TID) | ORAL | Status: AC
  Administered 2016-12-27: 15 mL via ORAL
  Filled 2016-12-26 (×2): qty 30

## 2016-12-26 MED ORDER — CEFAZOLIN SODIUM-DEXTROSE 2-4 GM/100ML-% IV SOLN
2.0000 g | INTRAVENOUS | Status: AC
  Administered 2016-12-26: 2 g via INTRAVENOUS
  Filled 2016-12-26: qty 100

## 2016-12-26 MED ORDER — ROCURONIUM BROMIDE 100 MG/10ML IV SOLN
INTRAVENOUS | Status: DC | PRN
  Administered 2016-12-26 (×2): 10 mg via INTRAVENOUS
  Administered 2016-12-26: 40 mg via INTRAVENOUS

## 2016-12-26 MED ORDER — 0.9 % SODIUM CHLORIDE (POUR BTL) OPTIME
TOPICAL | Status: DC | PRN
  Administered 2016-12-26: 4000 mL

## 2016-12-26 MED ORDER — DEXAMETHASONE SODIUM PHOSPHATE 10 MG/ML IJ SOLN
INTRAMUSCULAR | Status: DC | PRN
  Administered 2016-12-26: 10 mg via INTRAVENOUS

## 2016-12-26 MED ORDER — ENOXAPARIN SODIUM 40 MG/0.4ML ~~LOC~~ SOLN
40.0000 mg | SUBCUTANEOUS | Status: DC
  Administered 2016-12-27 – 2016-12-28 (×2): 40 mg via SUBCUTANEOUS
  Filled 2016-12-26 (×2): qty 0.4

## 2016-12-26 MED ORDER — FENTANYL CITRATE (PF) 100 MCG/2ML IJ SOLN
INTRAMUSCULAR | Status: AC
  Administered 2016-12-27: 25 ug
  Filled 2016-12-26: qty 4

## 2016-12-26 MED ORDER — SODIUM CHLORIDE 0.9 % IJ SOLN
INTRAMUSCULAR | Status: AC
  Filled 2016-12-26: qty 10

## 2016-12-26 MED ORDER — SUGAMMADEX SODIUM 200 MG/2ML IV SOLN
INTRAVENOUS | Status: AC
  Filled 2016-12-26: qty 2

## 2016-12-26 MED ORDER — SENNOSIDES-DOCUSATE SODIUM 8.6-50 MG PO TABS
2.0000 | ORAL_TABLET | Freq: Every day | ORAL | Status: DC
  Administered 2016-12-27: 2 via ORAL
  Filled 2016-12-26 (×2): qty 2

## 2016-12-26 MED ORDER — PROPOFOL 10 MG/ML IV BOLUS
INTRAVENOUS | Status: DC | PRN
  Administered 2016-12-26: 150 mg via INTRAVENOUS

## 2016-12-26 MED ORDER — PROMETHAZINE HCL 25 MG/ML IJ SOLN
INTRAMUSCULAR | Status: AC
  Filled 2016-12-26: qty 1

## 2016-12-26 MED ORDER — ONDANSETRON HCL 4 MG PO TABS: 4.0000 mg | ORAL_TABLET | Freq: Four times a day (QID) | ORAL | Status: DC | PRN

## 2016-12-26 MED ORDER — LACTATED RINGERS IV SOLN
INTRAVENOUS | Status: DC
  Administered 2016-12-26 (×2): via INTRAVENOUS

## 2016-12-26 MED ORDER — ENOXAPARIN SODIUM 40 MG/0.4ML ~~LOC~~ SOLN
40.0000 mg | SUBCUTANEOUS | Status: AC
  Administered 2016-12-26: 40 mg via SUBCUTANEOUS
  Filled 2016-12-26: qty 0.4

## 2016-12-26 MED ORDER — OXYCODONE HCL 5 MG PO TABS
5.0000 mg | ORAL_TABLET | ORAL | Status: DC | PRN
  Administered 2016-12-26: 5 mg via ORAL
  Filled 2016-12-26: qty 1

## 2016-12-26 MED ORDER — FENTANYL CITRATE (PF) 100 MCG/2ML IJ SOLN
25.0000 ug | INTRAMUSCULAR | Status: DC | PRN
  Administered 2016-12-26 (×3): 50 ug via INTRAVENOUS

## 2016-12-26 MED ORDER — LOSARTAN POTASSIUM 25 MG PO TABS
25.0000 mg | ORAL_TABLET | Freq: Every day | ORAL | Status: DC
  Administered 2016-12-27 – 2016-12-28 (×2): 25 mg via ORAL
  Filled 2016-12-26 (×2): qty 1

## 2016-12-26 MED ORDER — SCOPOLAMINE 1 MG/3DAYS TD PT72
MEDICATED_PATCH | TRANSDERMAL | Status: DC | PRN
  Administered 2016-12-26: 1 via TRANSDERMAL

## 2016-12-26 MED ORDER — FENTANYL CITRATE (PF) 100 MCG/2ML IJ SOLN
25.0000 ug | INTRAMUSCULAR | Status: DC | PRN
  Administered 2016-12-26 – 2016-12-27 (×3): 25 ug via INTRAVENOUS
  Filled 2016-12-26 (×3): qty 2

## 2016-12-26 MED ORDER — PROPOFOL 10 MG/ML IV BOLUS
INTRAVENOUS | Status: AC
  Filled 2016-12-26: qty 20

## 2016-12-26 MED ORDER — BUPIVACAINE HCL (PF) 0.25 % IJ SOLN
INTRAMUSCULAR | Status: AC
  Filled 2016-12-26: qty 30

## 2016-12-26 MED ORDER — PHENYLEPHRINE 40 MCG/ML (10ML) SYRINGE FOR IV PUSH (FOR BLOOD PRESSURE SUPPORT)
PREFILLED_SYRINGE | INTRAVENOUS | Status: DC | PRN
  Administered 2016-12-26 (×4): 80 ug via INTRAVENOUS

## 2016-12-26 MED ORDER — PROMETHAZINE HCL 25 MG/ML IJ SOLN
6.2500 mg | INTRAMUSCULAR | Status: DC | PRN
  Administered 2016-12-26: 6.25 mg via INTRAVENOUS

## 2016-12-26 MED ORDER — FENTANYL CITRATE (PF) 250 MCG/5ML IJ SOLN
INTRAMUSCULAR | Status: AC
  Filled 2016-12-26: qty 5

## 2016-12-26 MED ORDER — IBUPROFEN 800 MG PO TABS
800.0000 mg | ORAL_TABLET | Freq: Four times a day (QID) | ORAL | Status: DC
  Administered 2016-12-27 – 2016-12-28 (×5): 800 mg via ORAL
  Filled 2016-12-26 (×5): qty 1

## 2016-12-26 MED ORDER — ONDANSETRON HCL 4 MG/2ML IJ SOLN: 4.0000 mg | Freq: Four times a day (QID) | INTRAMUSCULAR | Status: DC | PRN

## 2016-12-26 MED ORDER — SODIUM CHLORIDE 0.9 % IJ SOLN
INTRAMUSCULAR | Status: DC | PRN
  Administered 2016-12-26: 20 mL

## 2016-12-26 MED ORDER — DEXAMETHASONE SODIUM PHOSPHATE 10 MG/ML IJ SOLN
INTRAMUSCULAR | Status: AC
  Filled 2016-12-26: qty 1

## 2016-12-26 MED ORDER — MIDAZOLAM HCL 2 MG/2ML IJ SOLN
INTRAMUSCULAR | Status: AC
  Filled 2016-12-26: qty 2

## 2016-12-26 MED ORDER — KCL IN DEXTROSE-NACL 20-5-0.45 MEQ/L-%-% IV SOLN
INTRAVENOUS | Status: DC
  Administered 2016-12-26: 18:00:00 via INTRAVENOUS
  Filled 2016-12-26 (×3): qty 1000

## 2016-12-26 MED ORDER — ONDANSETRON HCL 4 MG/2ML IJ SOLN
INTRAMUSCULAR | Status: AC
  Filled 2016-12-26: qty 2

## 2016-12-26 MED ORDER — PHENYLEPHRINE 40 MCG/ML (10ML) SYRINGE FOR IV PUSH (FOR BLOOD PRESSURE SUPPORT)
PREFILLED_SYRINGE | INTRAVENOUS | Status: AC
  Filled 2016-12-26: qty 10

## 2016-12-26 MED ORDER — SCOPOLAMINE 1 MG/3DAYS TD PT72
MEDICATED_PATCH | TRANSDERMAL | Status: AC
  Filled 2016-12-26: qty 1

## 2016-12-26 MED ORDER — LIDOCAINE 2% (20 MG/ML) 5 ML SYRINGE
INTRAMUSCULAR | Status: AC
  Filled 2016-12-26: qty 5

## 2016-12-26 MED ORDER — PHENYLEPHRINE HCL 10 MG/ML IJ SOLN
INTRAVENOUS | Status: DC | PRN
  Administered 2016-12-26: 20 ug/min via INTRAVENOUS

## 2016-12-26 MED ORDER — LIDOCAINE HCL (CARDIAC) 20 MG/ML IV SOLN
INTRAVENOUS | Status: DC | PRN
  Administered 2016-12-26: 50 mg via INTRAVENOUS

## 2016-12-26 MED ORDER — SODIUM CHLORIDE 0.9 % IV SOLN: Freq: Once | INTRAVENOUS | Status: DC

## 2016-12-26 MED ORDER — SUGAMMADEX SODIUM 200 MG/2ML IV SOLN
INTRAVENOUS | Status: DC | PRN
  Administered 2016-12-26: 170 mg via INTRAVENOUS

## 2016-12-26 MED ORDER — ENSURE ENLIVE PO LIQD
237.0000 mL | Freq: Two times a day (BID) | ORAL | Status: DC
  Administered 2016-12-27 – 2016-12-28 (×3): 237 mL via ORAL

## 2016-12-26 SURGICAL SUPPLY — 63 items
ADH SKN CLS APL DERMABOND .7 (GAUZE/BANDAGES/DRESSINGS)
AGENT HMST KT MTR STRL THRMB (HEMOSTASIS) ×4
AGENT HMST MTR 8 SURGIFLO (HEMOSTASIS)
ATTRACTOMAT 16X20 MAGNETIC DRP (DRAPES) ×1 IMPLANT
BLADE EXTENDED COATED 6.5IN (ELECTRODE) ×3 IMPLANT
CELLS DAT CNTRL 66122 CELL SVR (MISCELLANEOUS) IMPLANT
CHLORAPREP W/TINT 26ML (MISCELLANEOUS) ×3 IMPLANT
CLIP VESOCCLUDE LG 6/CT (CLIP) ×4 IMPLANT
CLIP VESOCCLUDE MED 6/CT (CLIP) ×3 IMPLANT
CLIP VESOCCLUDE MED LG 6/CT (CLIP) ×3 IMPLANT
CONT SPEC 4OZ CLIKSEAL STRL BL (MISCELLANEOUS) ×1 IMPLANT
DERMABOND ADVANCED (GAUZE/BANDAGES/DRESSINGS)
DERMABOND ADVANCED .7 DNX12 (GAUZE/BANDAGES/DRESSINGS) IMPLANT
DRAPE INCISE IOBAN 66X45 STRL (DRAPES) IMPLANT
DRAPE WARM FLUID 44X44 (DRAPE) ×3 IMPLANT
DRSG OPSITE POSTOP 4X8 (GAUZE/BANDAGES/DRESSINGS) ×1 IMPLANT
ELECT REM PT RETURN 15FT ADLT (MISCELLANEOUS) ×3 IMPLANT
GAUZE SPONGE 4X4 16PLY XRAY LF (GAUZE/BANDAGES/DRESSINGS) IMPLANT
GLOVE BIO SURGEON STRL SZ 6 (GLOVE) ×6 IMPLANT
GLOVE BIO SURGEON STRL SZ 6.5 (GLOVE) ×6 IMPLANT
GOWN STRL REUS W/ TWL LRG LVL3 (GOWN DISPOSABLE) ×4 IMPLANT
GOWN STRL REUS W/TWL LRG LVL3 (GOWN DISPOSABLE) ×6
HANDLE SUCTION POOLE (INSTRUMENTS) IMPLANT
HEMOSTAT ARISTA ABSORB 3G PWDR (MISCELLANEOUS) ×1 IMPLANT
KIT BASIN OR (CUSTOM PROCEDURE TRAY) ×3 IMPLANT
LIGASURE IMPACT 36 18CM CVD LR (INSTRUMENTS) ×1 IMPLANT
LOOP VESSEL MAXI BLUE (MISCELLANEOUS) ×1 IMPLANT
NEEDLE HYPO 22GX1.5 SAFETY (NEEDLE) ×6 IMPLANT
NS IRRIG 1000ML POUR BTL (IV SOLUTION) ×6 IMPLANT
PACK GENERAL/GYN (CUSTOM PROCEDURE TRAY) ×3 IMPLANT
RELOAD PROXIMATE 75MM BLUE (ENDOMECHANICALS) IMPLANT
RELOAD PROXIMATE TA60MM BLUE (ENDOMECHANICALS) IMPLANT
RELOAD STAPLE 60 BLU REG PROX (ENDOMECHANICALS) IMPLANT
RELOAD STAPLE 75 3.8 BLU REG (ENDOMECHANICALS) IMPLANT
RETRACTOR WND ALEXIS 18 MED (MISCELLANEOUS) IMPLANT
RETRACTOR WND ALEXIS 25 LRG (MISCELLANEOUS) IMPLANT
RTRCTR WOUND ALEXIS 18CM MED (MISCELLANEOUS)
RTRCTR WOUND ALEXIS 25CM LRG (MISCELLANEOUS)
SHEET LAVH (DRAPES) ×3 IMPLANT
SPOGE SURGIFLO 8M (HEMOSTASIS)
SPONGE LAP 18X18 X RAY DECT (DISPOSABLE) ×3 IMPLANT
SPONGE SURGIFLO 8M (HEMOSTASIS) IMPLANT
STAPLER GUN LINEAR PROX 60 (STAPLE) IMPLANT
STAPLER PROXIMATE 75MM BLUE (STAPLE) IMPLANT
STAPLER VISISTAT 35W (STAPLE) IMPLANT
SUCTION POOLE HANDLE (INSTRUMENTS) ×3
SURGIFLO W/THROMBIN 8M KIT (HEMOSTASIS) ×2 IMPLANT
SUT MNCRL AB 4-0 PS2 18 (SUTURE) ×2 IMPLANT
SUT PDS AB 1 TP1 96 (SUTURE) ×6 IMPLANT
SUT SILK 3 0 SH CR/8 (SUTURE) ×3 IMPLANT
SUT VIC AB 0 CT1 36 (SUTURE) ×14 IMPLANT
SUT VIC AB 2-0 CT1 36 (SUTURE) ×6 IMPLANT
SUT VIC AB 2-0 CT2 27 (SUTURE) ×20 IMPLANT
SUT VIC AB 3-0 CTX 36 (SUTURE) IMPLANT
SUT VIC AB 3-0 SH 18 (SUTURE) IMPLANT
SUT VIC AB 3-0 SH 27 (SUTURE) ×18
SUT VIC AB 3-0 SH 27X BRD (SUTURE) ×2 IMPLANT
SYR 30ML LL (SYRINGE) ×6 IMPLANT
SYR BULB IRRIGATION 50ML (SYRINGE) ×1 IMPLANT
TOWEL OR 17X26 10 PK STRL BLUE (TOWEL DISPOSABLE) ×3 IMPLANT
TOWEL OR NON WOVEN STRL DISP B (DISPOSABLE) ×3 IMPLANT
TRAY FOLEY W/METER SILVER 16FR (SET/KITS/TRAYS/PACK) ×3 IMPLANT
UNDERPAD 30X30 (UNDERPADS AND DIAPERS) ×3 IMPLANT

## 2016-12-26 NOTE — Discharge Instructions (Signed)
12/26/2016  Return to work: 6 weeks  Activity: 1. Be up and out of the bed during the day.  Take a nap if needed.  You may walk up steps but be careful and use the hand rail.  Stair climbing will tire you more than you think, you may need to stop part way and rest.   2. No lifting or straining for 6 weeks.  3. No driving for 2 weeks.  Do Not drive if you are taking narcotic pain medicine.  4. Shower daily.  Use soap and water on your incision and pat dry; don't rub.   5. No sexual activity and nothing in the vagina for 8 weeks.  Medications:  - Take ibuprofen and tylenol first line for pain control. Take these regularly (every 6 hours) to decrease the build up of pain.  - If necessary, for severe pain not relieved by ibuprofen, take percocet.  - While taking percocet you should take sennakot every night to reduce the likelihood of constipation. If this causes diarrhea, stop its use.  Diet: 1. Low sodium Heart Healthy Diet is recommended.  2. It is safe to use a laxative if you have difficulty moving your bowels.   Wound Care: 1. Keep clean and dry.  Shower daily.  Reasons to call the Doctor:   Fever - Oral temperature greater than 100.4 degrees Fahrenheit  Foul-smelling vaginal discharge  Difficulty urinating  Nausea and vomiting  Increased pain at the site of the incision that is unrelieved with pain medicine.  Difficulty breathing with or without chest pain  New calf pain especially if only on one side  Sudden, continuing increased vaginal bleeding with or without clots.   Follow-up: 1. See Everitt Amber in 2 weeks.  Contacts: For questions or concerns you should contact:  Dr. Everitt Amber at [phone] After hours and on week-ends call [phone] and ask to speak to the physician on call for Gynecologic Oncology

## 2016-12-26 NOTE — Interval H&P Note (Signed)
History and Physical Interval Note:  12/26/2016 10:11 AM  Holly [name]  has presented today for surgery, with the diagnosis of PELVIC MASS  The various methods of treatment have been discussed with the patient and family. After consideration of risks, benefits and other options for treatment, the patient has consented to  Procedure(s): EXPLORATORY LAPAROTOMY (N/A) HYSTERECTOMY ABDOMINAL WITH BILATERAL  SALPINGO-OOPHORECTOMY (Bilateral) POSSIBLE RADICAL TUMOR DEBULKING (N/A) POSSIBLE COLOSTOMY (N/A) as a surgical intervention .  The patient's history has been reviewed, patient examined, no change in status, stable for surgery.  I have reviewed the patient's chart and labs.  Questions were answered to the patient's satisfaction.     Donaciano Eva

## 2016-12-26 NOTE — Anesthesia Preprocedure Evaluation (Addendum)
Anesthesia Evaluation  Patient identified by MRN, date of birth, ID band Patient awake    Reviewed: Allergy & Precautions, NPO status , Patient's Chart, lab work & pertinent test results  History of Anesthesia Complications (+) PONV and history of anesthetic complications  Airway Mallampati: II  TM Distance: >3 FB Neck ROM: Full    Dental no notable dental hx. (+) Teeth Intact, Dental Advisory Given   Pulmonary neg pulmonary ROS,    breath sounds clear to auscultation       Cardiovascular negative cardio ROS   Rhythm:Regular Rate:Normal     Neuro/Psych negative neurological ROS     GI/Hepatic Neg liver ROS, hiatal hernia, GERD  ,  Endo/Other  negative endocrine ROS  Renal/GU negative Renal ROS     Musculoskeletal negative musculoskeletal ROS (+)   Abdominal Normal abdominal exam  (+)   Peds  Hematology negative hematology ROS (+)   Anesthesia Other Findings Day of surgery medications reviewed with the patient.  Reproductive/Obstetrics                           Anesthesia Physical Anesthesia Plan  ASA: II  Anesthesia Plan: General   Post-op Pain Management:    Induction: Intravenous  PONV Risk Score and Plan: 4 or greater and Ondansetron, Dexamethasone, Midazolam, Scopolamine patch - Pre-op and Treatment may vary due to age or medical condition  Airway Management Planned: Oral ETT  Additional Equipment:   Intra-op Plan:   Post-operative Plan: Extubation in OR  Informed Consent: I have reviewed the patients History and Physical, chart, labs and discussed the procedure including the risks, benefits and alternatives for the proposed anesthesia with the patient or authorized representative who has indicated his/her understanding and acceptance.   Dental advisory given  Plan Discussed with: CRNA  Anesthesia Plan Comments:         Anesthesia Quick Evaluation

## 2016-12-26 NOTE — Progress Notes (Addendum)
Dr. Linna Caprice in- saw EKG COPY- aware of patient's condition- O.K. To go to floor

## 2016-12-26 NOTE — Progress Notes (Signed)
Dr. Linna Caprice in- saw EKG readings- reviewed chart- orders given.

## 2016-12-26 NOTE — Progress Notes (Signed)
Nurse into room to do 2200 scheduled medications. Patient resting quietly with eyes closed, resp even and unlabored, no acute distress noted at this time. Spouse questioned as to which medications I was going to be giving and when told what was ordered for this time, spouse requested that medications not be given at this time because "she can't tolerate the milk of magnesium on a good day, if she takes that now she will start throwing up." Medications held per spouse request at this time.

## 2016-12-26 NOTE — H&P (View-Only) (Signed)
Consult Note: Gyn-Onc  Consult was requested by Dr. Simona Huh for the evaluation of Holly [name] 61 y.o. female  CC:  Chief Complaint  Patient presents with  . Pelvic mass    Assessment/Plan:  Ms. Holly [name]  is a 61 y.o.  year old with a 20+ cm pelvic mass of unclear etiology in the setting of low volume ascites.  I reviewed the MRI images with [name] and her family. The mass is solid, and with the small volume ascites this is concerning for malignancy, though, it does not appear consistent with typical ovarian cancer presentation. Additionally on the MRI images there is concern that this may be emanating from the uterine fundus. I speculate that it could be a benign or malignant soft tissue tumor.  I discussed that it is possible that the rectum may be involved (though not apparently on today's exam). If this is the case, there is a possibility that she will require rectal resection with either reanastamosis or stoma formation. I discussed that she may require surgical management of GU or small intestinal structures also pending surgical findings. This surgery would involve substantial surgical risks including  bleeding, infection, damage to internal organs (such as bladder,ureters, bowels), blood clot, reoperation and rehospitalization.  I am recommending exploratory laparotomy, TAH, BSO, radical tumor debulking, possible bowel resection or colostomy. We will draw tumor markers today.  HPI: Holly [name] is a 61 year old parous woman who is seen in consultation at the request of Dr Simona Huh for a large pelvic mass. The patient has 3 months of vague symptoms of fullness in the pelvis, urinary frequency and difficulty with defecation. She denies vaginal bleeding or rectal bleeding.  She was seen in an urgen care in July, 2018 and a TVUS was performed on 11/13/16 which showed a large hypoechoic lobular mass seen posterior to the uterus measuring 18.2x16.1x11.8cm, blood flow was seen along  the periphery of the mass. It was considered to be likely to be a fibroid vs pelvic mass vs ovarian mass. Neither ovary was removed. Moderate amount of free fluid in the pelvis. THe uterus measures 4x10x4cm. The endometrium is 74mm.  An MRI was performed on 12/05/16 which showed a 13.9x11.9x18.3cm T1 hypointense central pelvic mass favoring to arise from the right ovary. No peritoneal metastases were identified in the pelvis. They favor a solid ovarian neoplasm such as a brenner tumor or fibroma.  The patient is otherwise quite helathy. She has had 2 prior vaginal deliveries and a nissan fundoplication.   Current Meds:  Outpatient Encounter Prescriptions as of 12/16/2016  Medication Sig  . acetaminophen (TYLENOL) 500 MG tablet Take 500-1,000 mg by mouth every 8 (eight) hours as needed.  . naproxen sodium (ANAPROX) 220 MG tablet Take 220 mg by mouth 2 (two) times daily as needed.   No facility-administered encounter medications on file as of 12/16/2016.     Allergy:  Allergies  Allergen Reactions  . Dilaudid [Hydromorphone Hcl] Nausea And Vomiting  . Sudafed [Pseudoephedrine Hcl]     Social Hx:   Social History   Social History  . Marital status: Married    Spouse name: N/A  . Number of children: N/A  . Years of education: N/A   Occupational History  . Not on file.   Social History Main Topics  . Smoking status: Never Smoker  . Smokeless tobacco: Never Used  . Alcohol use No  . Drug use: No  . Sexual activity: Not on file   Other  Topics Concern  . Not on file   Social History Narrative  . No narrative on file    Past Surgical Hx:  Past Surgical History:  Procedure Laterality Date  . ganglion cyst removed    . Hital Hernia    . Nissan Fundoplication      Past Medical Hx: History reviewed. No pertinent past medical history.  Past Gynecological History:  SVD x 2 No LMP recorded. Patient is postmenopausal.  Family Hx:  Family History  Problem Relation Age of Onset   . Diabetes Mother   . Diabetes Father   . Hypertension Father   . Stroke Father     Review of Systems:  Constitutional  Feels well,    ENT Normal appearing ears and nares bilaterally Skin/Breast  No rash, sores, jaundice, itching, dryness Cardiovascular  No chest pain, shortness of breath, or edema  Pulmonary  No cough or wheeze.  Gastro Intestinal  No nausea, vomitting, or diarrhoea. No bright red blood per rectum, no abdominal pain, change in bowel movement, or constipation.  Genito Urinary  +frequency, no urgency, dysuria, bleeding Musculo Skeletal  No myalgia, arthralgia, joint swelling or pain  Neurologic  No weakness, numbness, change in gait,  Psychology  No depression, anxiety, insomnia.   Vitals:  Blood pressure (!) 180/88, pulse (!) 110, temperature 98.3 F (36.8 C), resp. rate 18, height 5\' 5"  (1.651 m), weight 183 lb 3.2 oz (83.1 kg), SpO2 98 %.  Physical Exam: WD in NAD Neck  Supple NROM, without any enlargements.  Lymph Node Survey No cervical supraclavicular or inguinal adenopathy Cardiovascular  Pulse normal rate, regularity and rhythm. S1 and S2 normal.  Lungs  Clear to auscultation bilateraly, without wheezes/crackles/rhonchi. Good air movement.  Skin  No rash/lesions/breakdown  Psychiatry  Alert and oriented to person, place, and time  Abdomen  Normoactive bowel sounds, abdomen soft, non-tender and nonobese without evidence of hernia. Palpable mobile mass in lower abdomen below umbilicus. Back No CVA tenderness Genito Urinary  Vulva/vagina: Normal external female genitalia.   No lesions. No discharge or bleeding.  Bladder/urethra:  No lesions or masses, well supported bladder  Vagina: normal, nodularity appreciated posterior to uterus in cul de sac which is fleshy rather than hard but somewhat irregular.  Cervix: Normal appearing, no lesions.  Uterus:  Small, mobile, no parametrial involvement or nodularity.  Adnexa: large 20+ cm minimally  mobile mass filling posterior pelvis behind uterus from sidewall to sidewall Rectal  Nodularity on surface of mass appreciated but does not feel invasive into rectum and no separate peritoneal nodularity. Extremities  No bilateral cyanosis, clubbing or edema.   Donaciano Eva, MD  12/16/2016, 5:56 PM

## 2016-12-26 NOTE — Op Note (Signed)
OPERATIVE NOTE 12/26/16  Preoperative Diagnosis: 1. Pelvic mass, ascites   Postoperative Diagnosis:stage IIIC ovarian cancer    Procedure(s) Performed: Exploratory laparotomy with total abdominal hysterectomy, bilateral salpingo-oophorectomy, omentectomy radical tumor debulking for ovarian cancer .  Surgeon: Thereasa Solo, MD.  Assistant Surgeon: Lahoma Crocker, M.D. Assistant: (an MD assistant was necessary for tissue manipulation, retraction and positioning due to the complexity of the case and hospital policies).   Specimens: Uterus, bilateral tubes / ovaries, omentum. Ascites   Estimated Blood Loss: 800 mL.  AscitesL 4045mL  IVF 1600cc crystalloid, 500 albumin  Urine CHENID:782UM  Complications: None.   Operative Findings: 20cm left ovarian mass densely adherent to the posterior uterus, right tube and ovary, cervix, sigmoid colon. 10cm omental cake. 4L ascites. 39mm size tumor nodules on the serosa of the terminal ileum and proximal sigmoid colon. 63mm nodules on right diaphragm.    This represented an optimal cytoreduction (R1) with 66mm nodules on intestine and diaphragm representing gross visible disease.   Procedure:   The patient was seen in the Holding Room. The risks, benefits, complications, treatment options, and expected outcomes were discussed with the patient.  The patient concurred with the proposed plan, giving informed consent.   The patient was  identified as Holly [name]  and the procedure verified as TAH, BSO, omentectomy, tumor debulking. A Time Out was held and the above information confirmed upon entry to the operating room..  After induction of anesthesia, the patient was draped and prepped in the usual sterile manner.  She was prepped and draped in the normal sterile fashion in the dorsal lithotomy position in padded Allen stirrups with good attention paid to support of the lower back and lower extremities. Position was adjusted for appropriate support. A Foley  catheter was placed to gravity.   A midline vertical incision was made and carried through the subcutaneous tissue to the fascia. The fascial incision was made and extended superiorally. The rectus muscles were separated. The peritoneum was identified and entered. Peritoneal incision was extended longitudinally.  The abdominal cavity was entered sharply and without incident. A Bookwalter retractor was then placed. A survey of the abdomen and pelvis revealed the above findings, which were significant for large volume ascites, an omental cake and a very large left ovarian mass locally adherent to the uterus, contralateral ovary and sigmoid colon and filling the posterior cul de sac.  The omental cake was dissected free from the transverse colon from the hepatic flexure to the splenic flexure using sharp metzenbaum scissor dissection. The lesser sac was entered. The tumor cake was separated from the mesentery of the transverse colon. The short gastric vessels were sealed with ligasure and the infragastric omentum was separated from the greater curvature of the stomach removing all bulky tumor. Hemostasis was confirmed. The colon was closely inspected and was noted to be intact and hemostatic.   After packing the small bowel into the upper abdomen, we began the hysterectomy salpingo-oophorectomy. Due to the size of the mass and its adherence to all pelvic structures, it was necessary to perform an en bloc resection of the mass with the uterus and contralateral ovary. The dissection was begun by entering the  pelvic sidewall just posterior to the right round ligament. The pararectal space was developed and the retroperitoneum developed up to the level of the common iliac artery.  The course of the ureter was identified with ease. The right IP was then skeletonized, and cut and sealed with ligasure. The ovary was  separated from its peritoneal attachments with the bovie with visualization of the ureter at all times.     The sigmoid colon was dissected from its dense tumor attachments to the left ovary using sharp dissection. The left retroperitoneal peritoneum was entered parallel to the sigmoid colon attachments and the left ureter was identified in the left retroperitoneal space. It was necessary to skeletonize the left ureter 360 degrees and place a vessel loop around it to ensure it remained free from the dissection and removal of the large left ovarian tumor. Using sharp and monopolar dissection, the left tube and ovary were freed from their peritoneal adhesions to the pelvis and sigmoid colon. The IP ligament was sealed with ligasure. The posterior and lateral tumor attachments were taken down with ligasure.   The bladder flap was created by opening peritoneum anteriorally and carefully mobilizing the bladder from the anterior uterus and cervix.   Careful dissection was employed posteriorally to free to tumor mass from its attachments to the posterior pelvic peritoneum and sigmoid colon. This allowed the mass and uterus to be mobilized and elevated into the abdomen.  The uterine vessels were clamped bilaterally at the uterine isthmus with curved Heaney clamps then the pedicles were transected and suture ligated. Successive passes of straight Heaney clamps were used down the cardinal ligaments bilaterally with each pass medial to the prior. The pedicles were sharply transected and made hemostatic with suture. The bladder was ensured to be below the cervicovaginal junction, then curved clamps were passed across the cervicovaginal junction and the uterus was sharply transected. The vaginal cuff was closed with 0-vicryl on interrupted figure of 8 suture.  The peritoneal cavity was irrigated. Hemostasis was achieved with interrupted figure of 8 sutures at the raw areas where the tumor had been separated from the sigmoid and posterior cul de sac. Arista and surgiflow were used to reinforce hemostasis and hemostasis was  confirmed at all surgical sites.  Residual tumor was present at the 56mm nodules on the terminal ileum and right diaphragm.   The fascia was reapproximated with 0 looped PDS using a total of two sutures. The subcutaneous layer was then irrigated copiously.  Exparel long acting local anesthetic was infiltrated into the subcutaneous tissues. The skin was closed with subcuticular sutures. The patient tolerated the procedure well.   Sponge, lap and needle counts were correct x 2.   Donaciano Eva, MD

## 2016-12-26 NOTE — Anesthesia Postprocedure Evaluation (Signed)
Anesthesia Post Note  Patient: Holly [name]  Procedure(s) Performed: Procedure(s) (LRB): EXPLORATORY LAPAROTOMY (N/A) HYSTERECTOMY ABDOMINAL WITH BILATERAL  SALPINGO-OOPHORECTOMY (Bilateral) RADICAL TUMOR DEBULKING; OMENTECTOMY (N/A)     Patient location during evaluation: PACU Anesthesia Type: General Level of consciousness: awake and alert Pain management: pain level controlled Vital Signs Assessment: post-procedure vital signs reviewed and stable Respiratory status: spontaneous breathing, nonlabored ventilation, respiratory function stable and patient connected to nasal cannula oxygen Cardiovascular status: blood pressure returned to baseline and stable Postop Assessment: no signs of nausea or vomiting Anesthetic complications: no    Last Vitals:  Vitals:   12/26/16 1529 12/26/16 1530  BP:  (!) 146/77  Pulse: 80 81  Resp: 14 14  Temp:  36.8 C  SpO2: 100% 100%    Last Pain:  Vitals:   12/26/16 1530  TempSrc:   PainSc: [place]

## 2016-12-26 NOTE — Transfer of Care (Signed)
Immediate Anesthesia Transfer of Care Note  Patient: Holly [name]  Procedure(s) Performed: Procedure(s): EXPLORATORY LAPAROTOMY (N/A) HYSTERECTOMY ABDOMINAL WITH BILATERAL  SALPINGO-OOPHORECTOMY (Bilateral) RADICAL TUMOR DEBULKING; OMENTECTOMY (N/A)  Patient Location: PACU  Anesthesia Type:General  Level of Consciousness: sedated, drowsy and responds to stimulation  Airway & Oxygen Therapy: Patient Spontanous Breathing and Patient connected to face mask oxygen  Post-op Assessment: Report given to RN and Post -op Vital signs reviewed and stable  Post vital signs: Reviewed and stable  Last Vitals:  Vitals:   12/26/16 0921  BP: (!) 156/89  Pulse: (!) 111  Resp: 16  Temp: 37.3 C  SpO2: 96%    Last Pain:  Vitals:   12/26/16 1058  TempSrc:   PainSc: 0-No pain      Patients Stated Pain Goal: 5 (77/41/42 3953)  Complications: No apparent anesthesia complications

## 2016-12-26 NOTE — Progress Notes (Signed)
Dr. Linna Caprice called- read EKG results- to see patient and review EKG COPY

## 2016-12-26 NOTE — Anesthesia Procedure Notes (Signed)
Procedure Name: Intubation Date/Time: 12/26/2016 11:21 AM Performed by: Willeen Cass P Pre-anesthesia Checklist: Patient identified, Emergency Drugs available, Suction available and Patient being monitored Patient Re-evaluated:Patient Re-evaluated prior to induction Oxygen Delivery Method: Circle System Utilized Preoxygenation: Pre-oxygenation with 100% oxygen Induction Type: IV induction Ventilation: Mask ventilation without difficulty Laryngoscope Size: Mac and 3 Grade View: Grade I Tube type: Oral Tube size: 7.0 mm Number of attempts: 1 Airway Equipment and Method: Stylet Placement Confirmation: ETT inserted through vocal cords under direct vision,  positive ETCO2 and breath sounds checked- equal and bilateral Secured at: 21 cm Tube secured with: Tape Dental Injury: Teeth and Oropharynx as per pre-operative assessment

## 2016-12-26 NOTE — Progress Notes (Signed)
12 Lead EKG done.

## 2016-12-27 LAB — CBC
HCT: 35.2 % — ABNORMAL LOW (ref 36.0–46.0)
Hemoglobin: 11.6 g/dL — ABNORMAL LOW (ref 12.0–15.0)
MCH: 29.3 pg (ref 26.0–34.0)
MCHC: 33 g/dL (ref 30.0–36.0)
MCV: 88.9 fL (ref 78.0–100.0)
PLATELETS: 272 10*3/uL (ref 150–400)
RBC: 3.96 MIL/uL (ref 3.87–5.11)
RDW: 13.6 % (ref 11.5–15.5)
WBC: 12.4 10*3/uL — AB (ref 4.0–10.5)

## 2016-12-27 LAB — BASIC METABOLIC PANEL
ANION GAP: 10 (ref 5–15)
BUN: 9 mg/dL (ref 6–20)
CALCIUM: 8.7 mg/dL — AB (ref 8.9–10.3)
CO2: 25 mmol/L (ref 22–32)
Chloride: 100 mmol/L — ABNORMAL LOW (ref 101–111)
Creatinine, Ser: 0.74 mg/dL (ref 0.44–1.00)
GFR calc Af Amer: >60 mL/min (ref >60)
GLUCOSE: 175 mg/dL — AB (ref 65–99)
Potassium: 4.6 mmol/L (ref 3.5–5.1)
SODIUM: 135 mmol/L (ref 135–145)

## 2016-12-27 NOTE — Progress Notes (Signed)
Patient ID: Holly [name], female   DOB: 1955-08-07, 61 y.o.   MRN: [id number] 1 Day Post-Op Procedure(s) (LRB): EXPLORATORY LAPAROTOMY (N/A) HYSTERECTOMY ABDOMINAL WITH BILATERAL  SALPINGO-OOPHORECTOMY (Bilateral) RADICAL TUMOR DEBULKING; OMENTECTOMY (N/A)  Subjective: Patient reports pain well controlled, tolerating PO, voiding without difficulty.    Objective: Vital signs in last 24 hours: Temp:  [97.5 F (36.4 C)-98.9 F (37.2 C)] 98.5 F (36.9 C) (09/07 1438) Pulse Rate:  [69-91] 71 (09/07 1438) Resp:  [13-20] 18 (09/07 1438) BP: (115-153)/(56-82) 115/74 (09/07 1438) SpO2:  [96 %-100 %] 96 % (09/07 1438) Last BM Date: 12/26/16  Intake/Output from previous day: 09/06 0701 - 09/07 0700 In: 3089.5 [P.O.:360; I.V.:2479.5; IV Piggyback:250] Out: 7400 [Urine:2600; Blood:800]  Physical Examination: General: alert and cooperative Resp: clear to auscultation bilaterally Cardio: regular rate and rhythm, S1, S2 normal, no murmur, click, rub or gallop GI: soft, non-tender; bowel sounds normal; no masses,  no organomegaly and incision: clean, dry, intact and with honeycomb dressing Extremities: extremities normal, atraumatic, no cyanosis or edema Vaginal Bleeding: none  Labs: WBC/Hgb/Hct/Plts:  12.4/11.6/35.2/272 (09/07 0503) BUN/Cr/glu/ALT/AST/amyl/lip:  9/0.74/--/--/--/--/-- (09/07 0503)   Assessment:  61 y.o. s/p Procedure(s): EXPLORATORY LAPAROTOMY HYSTERECTOMY ABDOMINAL WITH BILATERAL  SALPINGO-OOPHORECTOMY RADICAL TUMOR DEBULKING; OMENTECTOMY: stable Pain:  Pain is well-controlled on oral medications.  Heme: hemodynamically stable, appropriate postop Hb  CV: Hypertension: continue oral meds. New diagnosis of HTN preop, therefore treatment not yet optimized.  GI:  Tolerating po: Yes  FEN: hep lock IVF  Prophylaxis: pharmacologic prophylaxis (with any of the following: enoxaparin (Lovenox) 40mg  SQ 2 hours prior to surgery then every day).  Gyn Onc: we discussed  operative findings of stage IIIC ovarian cancer. Plan is for chemotherapy postop. We have set up follow-up with med onc as an outpatient.  Plan: Advance diet Encourage ambulation Discontinue IV fluids Dispo:  Discharge plan to include :consults:  The patient is to be discharged to home on POD 2-3 pending tolerance of diet..   LOS: 1 day    Donaciano Eva 12/27/2016, 3:11 PM

## 2016-12-28 MED ORDER — ENOXAPARIN (LOVENOX) PATIENT EDUCATION KIT
PACK | Freq: Once | Status: DC
  Filled 2016-12-28: qty 1

## 2016-12-28 MED ORDER — ENOXAPARIN SODIUM 40 MG/0.4ML ~~LOC~~ SOLN: 40.0000 mg | SUBCUTANEOUS | 0 refills | Status: DC

## 2016-12-28 MED ORDER — OXYCODONE-ACETAMINOPHEN 5-325 MG PO TABS: 1.0000 | ORAL_TABLET | ORAL | 0 refills | Status: DC | PRN

## 2016-12-28 NOTE — Discharge Summary (Signed)
Physician Discharge Summary  Patient ID: Holly [name] MRN: [id number] DOB/AGE: 10-04-1955 61 y.o.  Admit date: 12/26/2016 Discharge date: 12/28/2016  Admission Diagnoses:  Pelvic mass in female  Discharge Diagnoses:  Active Problems:   Pelvic mass in female   Left ovarian epithelial cancer ([place])   Secondary malignant neoplasm of parietal peritoneum ([facility])   Secondary malignant neoplasm of omentum [name])   Discharged Condition: good  Hospital Course: On 12/26/2016, the patient underwent the following: Procedure(s): EXPLORATORY LAPAROTOMY HYSTERECTOMY ABDOMINAL WITH BILATERAL  SALPINGO-OOPHORECTOMY RADICAL TUMOR DEBULKING; OMENTECTOMY.   The postoperative course was uneventful.  She was discharged to home on postoperative day 2 tolerating a regular diet.  Consults: None  Significant Diagnostic Studies: None  Treatments: surgery: see above  Discharge Exam: Blood pressure 111/63, pulse 67, temperature 98.1 F (36.7 C), temperature source Oral, resp. rate 18, height 5\' 5"  (1.651 m), weight 179 lb (81.2 kg), SpO2 97 %. General appearance: alert Resp: clear to auscultation bilaterally Cardio: regular rate and rhythm, S1, S2 normal, no murmur, click, rub or gallop GI: soft, non-tender; bowel sounds normal; no masses,  no organomegaly Extremities: extremities normal, atraumatic, no cyanosis or edema Incision/Wound: C/D/I  Disposition: 01-Home or Self Care  Discharge Instructions    Call MD for:  extreme fatigue    Complete by:  As directed    Call MD for:  persistant dizziness or light-headedness    Complete by:  As directed    Call MD for:  persistant nausea and vomiting    Complete by:  As directed    Call MD for:  redness, tenderness, or signs of infection (pain, swelling, redness, odor or green/yellow discharge around incision site)    Complete by:  As directed    Call MD for:  severe uncontrolled pain    Complete by:  As directed    Call MD for:  temperature >100.4     Complete by:  As directed    Diet - low sodium heart healthy    Complete by:  As directed    Discharge instructions    Complete by:  As directed    Planning for Recovery and Going Home Your Guide to Gynecologic Surgery    In-Hospital Recovery Plan Team Caring for You After Surgery In addition to the nursing staff on the unit, the gynecological surgery team will care for you. This team is led by your surgeon and includes medical students and a physician assistant or nurse practitioner. There will be a physician in the hospital 24 hours a day to tend to your needs. The students report directly to your surgeon, who is the one overseeing all of your care.  Pain Relief After Surgery Your pain will be assessed regularly on a scale from 0 to 10. Pain assessment is  necessary to guide your pain relief. It is essential that you are able to take deep breaths, cough and move. Prevention or early treatment of pain is far more effective than trying to treat severe pain. Therefore, we have devised a specialized regimen to stay ahead of your pain and use almost no narcotics, which can slow down your recovery process. If you have an epidural catheter, you will receive a  constant infusion of pain medication through your epidural. If you need additional pain relief, you will be able to push a button to increase the medication in your epidural. You will also be given acetaminophen and an ibuprofen-like medication to keep your pain under control.  You can  always ask for additional pain pills if you are not comfortable. In most cases an anesthesiologist with expertise in pain management will visit you every day and help design your pain management plan.  One Day After Surgery Focus on drinking and walking. You will start drinking clear liquids after surgery. The intravenous fluids will be stopped, and the catheter may be removed  from your bladder. We expect you to get out of bed, with the nurses' or  assistants' help, sit in a chair for six hours and start to move about in the hallways. You will also meet with a case manager to assess your discharge needs, including home nursing. Your physician may order home care to assist with your transition home.  Home nursing visits, which are intermittent, help you get readjusted to home by teaching treatments, monitoring medications, and performing clinical assessment and reporting back to your physician. Other services may include therapy and medical equipment; private duty services are also available. If you are going "home" to a different address upon discharge, please alert [place]. A Home Care Coordinator can visit with you while in the hospital to discuss your options. If you have questions please speak with your case manager. If you need rehabilitation at a facility, a social worker will assist with this. If you need rehabilitation at a facility, a social worker will assist with this. If your procedure was performed in a minimally invasive fashion, you will be discharged to home if your pain is well controlled and you are tolerating a regular diet.     Two Days After Surgery You will start eating a soft diet and change to a more solid diet as you feel up to it. The catheter from your bladder will be removed, if not already done so. If there is a dressing on your wound, it will be removed. The tubing will be disconnected from your IV. We expect you to be out of bed for the majority of the day and walking at least three times in the hallway, with assistance as needed.  You may be discharged at this point if it is felt you are ready.   Three Days After Surgery You continue to eat your low residue diet. You may be ready to go home if you are drinking enough to keep yourself hydrated, your pain is well controlled, you are not belching or nauseated, you are passing gas and you are able to get around on your own. However, we will not discharge you from  the hospital until we are sure you are ready.  Discharge Discharge time is at 10 a.m. You will need to make arrangements for someone to accompany you home. You will not be released without someone present. Please keep in mind that we strive to get patients discharged as quickly as possible, but there may be delays for a variety of reasons. Complications That May Delay Discharge: ? Nausea and vomiting: It is very common to feel sick after your surgery. We give you medication to reduce this. However, if you do feel sick, you should reduce the amount you are taking by mouth. Small, frequent meals or drinks are best in this  situation. As long as you can drink and keep yourself hydrated, the nausea will likely pass.  Ileus: Following surgery, the bowel can be sluggish, making it difficult for food and gas to pass through the intestines. This is called an ileus. We have designed our care program to do everything possible to reduce the likelihood  of an ileus. If you do develop an ileus, it usually only lasts two to three days. However, it may require a small tube down the nose to decompress the stomach. The best way to avoid an ileus is to reduce the amount of narcotic pain medications, get up as much as possible after your surgery, and stimulate the bowel early after surgery with small amounts of food and liquids.  Wound infection: If a wound infection develops, this usually happens three to ten days after surgery.    Urinary retention: This is if you are unable to urinate after the catheter from your bladder is removed. The catheter may need to be reinserted until you are able to urinate on your own. This can be caused by anesthesia, pain medication and decreased activity.    When you are preparing to go home, you will receive:  Detailed discharge instructions, with information about your operation and medications    All prescriptions for medications you need at home; prescriptions can  be filled while you are in the hospital if you would like    You may be prescribed Lovenox. Lovenox is used to reduce the risk of developing a blood clot after surgery. An appointment to see your surgeon or provider one to two weeks after you leave the hospital for follow-up   After Discharge Once you are discharged: Call us at any time if you are worried about your recovery or if you should have any questions. During regular office hours, (8:30 a.m.-4:30 p.m.), and after hours call [phone].  Call us immediately if:  You have a fever higher than 100.4 degrees.   Your wound is red, more painful or has drainage.    You are nauseated, vomiting or can't keep liquids down.    Your pain is worse and not able to be controlled with the regimen you were sent home with.    If you are bleeding heavily or have a lot of fluid coming from your vagina. If you are on narcotics, the goal is to wean you off of them. If you are running low on supply and need more, call the nurse a few days before you will run out.  It is generally easier to reach someone between 8:30 a.m. - 4:30 p.m., so call early if you think something is not right. A nurse or nurse practitioner is available every day to answer your questions. After hours and on the weekends, the calls go to the resident doctors in the hospital. It may take longer for your phone call to be returned during this time. If you have a true emergency, such as severe abdominal pain, chest pain, shortness of breath or any other acute issues, call 911 and go to the local emergency room. Have them contact our team once you are stable.  Concerns After Discharge Bowel Function Following Your Surgery Your bowels will take several weeks to settle down and may be unpredictable at first. Your bowel movements may become loose, or you may be constipated. For the vast number of patients, this will get back to normal with time. Make sure you eat nutritious  meals, drink plenty of fluids and take regular walks during the first two weeks after your operation. Your Guide to Gynecologic Surgery   Abdominal Pain It is not unusual to suffer gripping pains (colic) during the first week following removal of a portion of your bowel. This pain usually lasts for a few minutes but goes away between spasms. If you  have severe pain lasting more than one to two hours or have a fever and feel generally unwell, you should contact us at  the telephone contact numbers listed at the end of this packet. Hysterectomy: You should have pelvic rest for six (6) weeks or as specified by your doctor after surgery. You should have nothing in the vagina (no tampons, douching, intercourse, etc.,) during this time period. If you have some vaginal spotting, this is normal. If you have heavy bleeding or  a lot of fluid from your vagina, this is NOT normal and you should contact your doctor's office or, if after hours, contact the doctor on call.  Diarrhea: Fiber and Imodium (Loperamide) The first step to improving your frequent or loose stools is to bulk up the stool with fiber. Metamucil is the most common type of fiber that is available at any drug store. Start with 1 teaspoon mixed into food, like yogurt or oatmeal, in the morning and evening. Try not to drink any fluid for one hour after you take the fiber. This will allow the fiber to act like a sponge in your intestines, soaking up all the excess water. Continue this for three to five days. You may increase by 1 teaspoon every three to five days until the desired affect, or you are at 1 tablespoon (3 teaspoons) twice a day. If this doesn't work, you may try over-the-counter Loperamide, which is an antidiarrheal medication. You may take one tablet in the morning and evening or 30 minutes before you typically have diarrhea. You may take up to eight of these tablets daily. It is best to discuss this with [place] prior to  using this medication. If you have continuous diarrhea and abdominal cramping, call [phone].  Foley Catheter Your surgeon may recommend you be discharged home with a foley catheter (bladder catheter) for 1 to 2 weeks. Typically this recommendation will be made for patients undergoing surgery to the lower urinary tract. Before you leave the hospital, your nurse should outfit you with a clip on the inner thigh to secure the catheter to prevent pulling as well as a small bag that can be easily worn on the upper leg under loose fitting pants and skirts. Your nurse will teach you how to exchange the large bag that typically comes with the catheter for the small bag. You may find it convenient to attach the small bag when active during the day and then the large bag when sleeping at night. If there is ever a point when you notice the catheter is not draining urine and youbegin to develop pain behind/above the pubic bone, you should report to the clinic or emergency room immediately as the catheter may be kinked or clogged. Kinking or clogging of the catheter prevents urine from draining from your bladder. Urine will quickly build up in the bladder and can cause severe pain as well as seriously disrupt healing if you have undergone surgery on the lower urinary tract. Additionally, pulling on the catheter can result in displacement of the balloon at the end of the catheter from inside of to outside  of the bladder. This also results in severe pain and can cause bleeding. For this reason, secure the catheter to the clip on your inner thigh at all times as the clip prevents against pulling.  Wound Care For the first few weeks following surgery, your wound may be slightly red and uncomfortable. You may shower and let the soapy water wash over your incision.  Avoid soaking in the tub for one month following surgery or until the wound is well healed. It will take the wound several months to "soften." It  is common to have bumpy areas in the wound near the belly  button and at the ends of the incision.  If you have staples, these should be removed when you are seen by your surgeon at the follow-up appointment. You may have a glue-like material on your incision. Do not pick at this. It will come off over time. It is the surgical glue used in surgery to close your incision. You also have sutures inside of you that will dissolve over time  Post-Surgery Diet Attention to good nutrition after surgery is important to your recovery. If you had no dietary restrictions prior to the surgery, you will have no special dietary restrictions after the surgery. However, consuming enough protein, calories, vitamins and minerals is necessary to support healing. Some patients find their appetite is less than normal after surgery. In this case, frequent small meals throughout the day may help. It is not uncommon to lose 10 to 15 pounds after surgery. However, by the fourth to fifth week, your weight loss should stabilize. It is normal that certain foods taste different and certain smells may make you nauseas. Over time, the amount you can comfortably consume will gradually increase. You should try to eat a balanced diet, which includes:  Foods that are soft, moist, and easy to chew and swallow    Canned or soft-cooked fruits and vegetables   Plenty of soft breads, rice, pasta, potatoes and other starchy foods (lowerfiber  varieties may be tolerated better initially)    High-protein foods and beverages, such as meats, eggs, milk, cottage cheese  or a supplemental nutrition drink like Boost or Ensure    Drink plenty of fluids-at least 8 to 10 cups per day. This includes water,  fruit juice, Gatorade, teas/coffee and milk. Drinking plenty is especially important if you have loose stools (diarrhea).   Avoid drinking a lot of caffeine, since this may dehydrate you.    Avoid fried, greasy and highly  seasoned or spicy foods.    Avoid carbonated beverages in the first couple weeks.    Avoid raw fruits and vegetables.   Hobbies/Activities Walking is encouraged after your surgery. You should plan to undertake regular exercise several times a day and gradually increase this during the four weeks following your operation until you are back to your normal level of activity. You may climb stairs. Don't do any heavy lifting greater than 10 pounds or contact sports for the first month after your surgery. Generally, you can return to hobbies and activities soon after your surgery. This will help you recover. It can take up to two to three months to fully recover. It is not unusual to be fatigued and require an afternoon nap for up to six to eight weeks following surgery. Your body is using this energy to heal your wounds. Set small goals for yourself and try to do a little more each day.  Work It is normal to return to work three to six weeks following your operation. If your job involves heavy manual work, then you should wait six weeks. However, you should check with your employer regarding rules, which may be relevant to your return to work. If you need a return-to-work form for your employer or disability papers, bring them to your follow-up appointment or fax them to our office at [phone].  Driving You may drive when you are off narcotics and pain-free enough to react quickly with your braking foot. For most patients, this occurs at one to four weeks following surgery.   Write down any questions you may have to ask your care team.  Important Contact Numbers: GYN Oncology Office: [phone]   Discharge wound care:    Complete by:  As directed    Keep clean and dry   Driving Restrictions    Complete by:  As directed    No driving for 1- 2 weeks   Increase activity slowly    Complete by:  As directed    Lifting restrictions    Complete by:  As directed    No lifting  > 5 lbs for 6 weeks   May shower / Bathe    Complete by:  As directed    No tub baths for 6 weeks   Sexual Activity Restrictions    Complete by:  As directed    No intercourse for 6 - 8 weeks     Allergies as of 12/28/2016      Reactions   Dilaudid [hydromorphone Hcl] Nausea And Vomiting   Sudafed [pseudoephedrine Hcl] Other (See Comments)   Nervous/jittery      Medication List    TAKE these medications   acetaminophen 500 MG tablet Commonly known as:  TYLENOL Take 1,000 mg by mouth every 8 (eight) hours as needed.   enoxaparin 40 MG/0.4ML injection Commonly known as:  LOVENOX Inject 0.4 mLs (40 mg total) into the skin daily.   loratadine 10 MG tablet Commonly known as:  CLARITIN Take 10 mg by mouth at bedtime.   losartan 25 MG tablet Commonly known as:  COZAAR Take 25 mg by mouth daily.   naproxen sodium 220 MG tablet Commonly known as:  ANAPROX Take 220-440 mg by mouth 2 (two) times daily as needed (pain).   oxyCODONE-acetaminophen 5-325 MG tablet Commonly known as:  PERCOCET/ROXICET Take 1-2 tablets by mouth every 4 (four) hours as needed for severe pain.            Discharge Care Instructions        Start     Ordered   12/29/16 0000  enoxaparin (LOVENOX) 40 MG/0.4ML injection  Every 24 hours     12/28/16 0942   12/28/16 0000  Increase activity slowly     12/28/16 0942   12/28/16 0000  May shower / Bathe    Comments:  No tub baths for 6 weeks   12/28/16 0942   12/28/16 0000  Driving Restrictions    Comments:  No driving for 1- 2 weeks   12/28/16 0942   12/28/16 0000  Lifting restrictions    Comments:  No lifting > 5 lbs for 6 weeks   12/28/16 0942   12/28/16 0000  Sexual Activity Restrictions    Comments:  No intercourse for 6 - 8 weeks   12/28/16 0942   12/28/16 0000  Diet - low sodium heart healthy     12/28/16 0942   12/28/16 0000  Discharge wound care:    Comments:  Keep clean and dry   12/28/16 0942   12/28/16 0000  Call MD for:   temperature >100.4     12/28/16 0942   12/28/16 0000  Call MD for:  persistant nausea and vomiting     12/28/16 0942   12/28/16 0000  Call MD for:  severe uncontrolled pain     12/28/16 0942  12/28/16 0000  Call MD for:  redness, tenderness, or signs of infection (pain, swelling, redness, odor or green/yellow discharge around incision site)     12/28/16 0942   12/28/16 0000  Call MD for:  persistant dizziness or light-headedness     12/28/16 0942   12/28/16 0000  Call MD for:  extreme fatigue     12/28/16 0942   12/28/16 0000  Discharge instructions    Comments:  Planning for Recovery and Going Home Your Guide to Gynecologic Surgery    In-Hospital Recovery Plan Team Caring for You After Surgery In addition to the nursing staff on the unit, the gynecological surgery team will care for you. This team is led by your surgeon and includes medical students and a physician assistant or nurse practitioner. There will be a physician in the hospital 24 hours a day to tend to your needs. The students report directly to your surgeon, who is the one overseeing all of your care.  Pain Relief After Surgery Your pain will be assessed regularly on a scale from 0 to 10. Pain assessment is  necessary to guide your pain relief. It is essential that you are able to take deep breaths, cough and move. Prevention or early treatment of pain is far more effective than trying to treat severe pain. Therefore, we have devised a specialized regimen to stay ahead of your pain and use almost no narcotics, which can slow down your recovery process. If you have an epidural catheter, you will receive a  constant infusion of pain medication through your epidural. If you need additional pain relief, you will be able to push a button to increase the medication in your epidural. You will also be given acetaminophen and an ibuprofen-like medication to keep your pain under control.  You can always ask for additional  pain pills if you are not comfortable. In most cases an anesthesiologist with expertise in pain management will visit you every day and help design your pain management plan.  One Day After Surgery Focus on drinking and walking. You will start drinking clear liquids after surgery. The intravenous fluids will be stopped, and the catheter may be removed  from your bladder. We expect you to get out of bed, with the nurses' or assistants' help, sit in a chair for six hours and start to move about in the hallways. You will also meet with a case manager to assess your discharge needs, including home nursing. Your physician may order home care to assist with your transition home.  Home nursing visits, which are intermittent, help you get readjusted to home by teaching treatments, monitoring medications, and performing clinical assessment and reporting back to your physician. Other services may include therapy and medical equipment; private duty services are also available. If you are going "home" to a different address upon discharge, please alert [place]. A Home Care Coordinator can visit with you while in the hospital to discuss your options. If you have questions please speak with your case manager. If you need rehabilitation at a facility, a social worker will assist with this. If you need rehabilitation at a facility, a social worker will assist with this. If your procedure was performed in a minimally invasive fashion, you will be discharged to home if your pain is well controlled and you are tolerating a regular diet.     Two Days After Surgery You will start eating a soft diet and change to a more solid diet as you feel up  to it. The catheter from your bladder will be removed, if not already done so. If there is a dressing on your wound, it will be removed. The tubing will be disconnected from your IV. We expect you to be out of bed for the majority of the day and walking at least three times in  the hallway, with assistance as needed.  You may be discharged at this point if it is felt you are ready.   Three Days After Surgery You continue to eat your low residue diet. You may be ready to go home if you are drinking enough to keep yourself hydrated, your pain is well controlled, you are not belching or nauseated, you are passing gas and you are able to get around on your own. However, we will not discharge you from the hospital until we are sure you are ready.  Discharge Discharge time is at 10 a.m. You will need to make arrangements for someone to accompany you home. You will not be released without someone present. Please keep in mind that we strive to get patients discharged as quickly as possible, but there may be delays for a variety of reasons. Complications That May Delay Discharge: ? Nausea and vomiting: It is very common to feel sick after your surgery. We give you medication to reduce this. However, if you do feel sick, you should reduce the amount you are taking by mouth. Small, frequent meals or drinks are best in this  situation. As long as you can drink and keep yourself hydrated, the nausea will likely pass.  Ileus: Following surgery, the bowel can be sluggish, making it difficult for food and gas to pass through the intestines. This is called an ileus. We have designed our care program to do everything possible to reduce the likelihood of an ileus. If you do develop an ileus, it usually only lasts two to three days. However, it may require a small tube down the nose to decompress the stomach. The best way to avoid an ileus is to reduce the amount of narcotic pain medications, get up as much as possible after your surgery, and stimulate the bowel early after surgery with small amounts of food and liquids.  Wound infection: If a wound infection develops, this usually happens three to ten days after surgery.    Urinary retention: This is if you are unable to  urinate after the catheter from your bladder is removed. The catheter may need to be reinserted until you are able to urinate on your own. This can be caused by anesthesia, pain medication and decreased activity.    When you are preparing to go home, you will receive:  Detailed discharge instructions, with information about your operation and medications    All prescriptions for medications you need at home; prescriptions can be filled while you are in the hospital if you would like    You may be prescribed Lovenox. Lovenox is used to reduce the risk of developing a blood clot after surgery. An appointment to see your surgeon or provider one to two weeks after you leave the hospital for follow-up   After Discharge Once you are discharged: Call us at any time if you are worried about your recovery or if you should have any questions. During regular office hours, (8:30 a.m.-4:30 p.m.), and after hours call [phone].  Call us immediately if:  You have a fever higher than 100.4 degrees.   Your wound is red, more painful or has  drainage.    You are nauseated, vomiting or can't keep liquids down.    Your pain is worse and not able to be controlled with the regimen you were sent home with.    If you are bleeding heavily or have a lot of fluid coming from your vagina. If you are on narcotics, the goal is to wean you off of them. If you are running low on supply and need more, call the nurse a few days before you will run out.  It is generally easier to reach someone between 8:30 a.m. - 4:30 p.m., so call early if you think something is not right. A nurse or nurse practitioner is available every day to answer your questions. After hours and on the weekends, the calls go to the resident doctors in the hospital. It may take longer for your phone call to be returned during this time. If you have a true emergency, such as severe abdominal pain, chest pain, shortness of breath or any  other acute issues, call 911 and go to the local emergency room. Have them contact our team once you are stable.  Concerns After Discharge Bowel Function Following Your Surgery Your bowels will take several weeks to settle down and may be unpredictable at first. Your bowel movements may become loose, or you may be constipated. For the vast number of patients, this will get back to normal with time. Make sure you eat nutritious meals, drink plenty of fluids and take regular walks during the first two weeks after your operation. Your Guide to Gynecologic Surgery   Abdominal Pain It is not unusual to suffer gripping pains (colic) during the first week following removal of a portion of your bowel. This pain usually lasts for a few minutes but goes away between spasms. If you have severe pain lasting more than one to two hours or have a fever and feel generally unwell, you should contact us at  the telephone contact numbers listed at the end of this packet. Hysterectomy: You should have pelvic rest for six (6) weeks or as specified by your doctor after surgery. You should have nothing in the vagina (no tampons, douching, intercourse, etc.,) during this time period. If you have some vaginal spotting, this is normal. If you have heavy bleeding or  a lot of fluid from your vagina, this is NOT normal and you should contact your doctor's office or, if after hours, contact the doctor on call.  Diarrhea: Fiber and Imodium (Loperamide) The first step to improving your frequent or loose stools is to bulk up the stool with fiber. Metamucil is the most common type of fiber that is available at any drug store. Start with 1 teaspoon mixed into food, like yogurt or oatmeal, in the morning and evening. Try not to drink any fluid for one hour after you take the fiber. This will allow the fiber to act like a sponge in your intestines, soaking up all the excess water. Continue this for three to five  days. You may increase by 1 teaspoon every three to five days until the desired affect, or you are at 1 tablespoon (3 teaspoons) twice a day. If this doesn't work, you may try over-the-counter Loperamide, which is an antidiarrheal medication. You may take one tablet in the morning and evening or 30 minutes before you typically have diarrhea. You may take up to eight of these tablets daily. It is best to discuss this with [place] prior to using this medication. If  you have continuous diarrhea and abdominal cramping, call [phone].  Foley Catheter Your surgeon may recommend you be discharged home with a foley catheter (bladder catheter) for 1 to 2 weeks. Typically this recommendation will be made for patients undergoing surgery to the lower urinary tract. Before you leave the hospital, your nurse should outfit you with a clip on...   12/28/16 0942   12/28/16 0000  oxyCODONE-acetaminophen (PERCOCET/ROXICET) 5-325 MG tablet  Every 4 hours PRN     12/28/16 0942       Signed: Lahoma Crocker A 12/28/2016, 9:47 AM

## 2016-12-28 NOTE — Progress Notes (Signed)
Discharge instructions discussed with patient and husband until no further questions ask. Able to answer questions about diet and when to call MD. Patient does not want to take Percocet --says is too strong. Dr Glennon Mac Laurance Flatten called and she is  Willing to  call in [facility] for patient. Oxycodone script destroyed. Pt and husband voice understanding  Of how to give lovenox injection- correct sites and time. lovenox teaching packet given and discussed

## 2016-12-28 NOTE — Care Management Note (Signed)
Case Management Note  Patient Details  Name: Holly [name] MRN: [id number] Date of Birth: 10-15-1955  Subjective/Objective:                 Patient with order to DC to home today. Chart reviewed. No Home Health or Equipment needs, no unacknowledged Case Management consults or medication needs identified at the time of this note. Plan for DC to home. If needs arise today prior to discharge, please call Carles Collet RN CM at [phone].    Action/Plan:   Expected Discharge Date:  12/28/16               Expected Discharge Plan:  Home/Self Care  In-House Referral:     Discharge planning Services  CM Consult  Post Acute Care Choice:    Choice offered to:     DME Arranged:    DME Agency:     HH Arranged:    HH Agency:     Status of Service:  Completed, signed off  If discussed at [employer] of Stay Meetings, dates discussed:    Additional Comments:  Carles Collet, RN 12/28/2016, 9:58 AM

## 2016-12-30 ENCOUNTER — Encounter: Payer: Self-pay | Admitting: Genetic Counselor

## 2016-12-30 ENCOUNTER — Ambulatory Visit (HOSPITAL_BASED_OUTPATIENT_CLINIC_OR_DEPARTMENT_OTHER): Payer: 59 | Admitting: Genetic Counselor

## 2016-12-30 ENCOUNTER — Other Ambulatory Visit: Payer: 59

## 2016-12-30 ENCOUNTER — Telehealth: Payer: Self-pay | Admitting: Gynecologic Oncology

## 2016-12-30 DIAGNOSIS — C569 Malignant neoplasm of unspecified ovary: Secondary | ICD-10-CM | POA: Diagnosis not present

## 2016-12-30 DIAGNOSIS — Z7183 Encounter for nonprocreative genetic counseling: Secondary | ICD-10-CM | POA: Diagnosis not present

## 2016-12-30 LAB — TYPE AND SCREEN
ABO/RH(D): A POS
Antibody Screen: NEGATIVE
UNIT DIVISION: 0
Unit division: 0

## 2016-12-30 LAB — BPAM RBC
BLOOD PRODUCT EXPIRATION DATE: 201809232359
Blood Product Expiration Date: 201809232359
UNIT TYPE AND RH: 6200
Unit Type and Rh: 6200

## 2016-12-30 NOTE — Progress Notes (Signed)
REFERRING PROVIDER: Everitt Amber, MD [address], [place] [address]  PRIMARY PROVIDER:  Lennie Odor, PA-C  PRIMARY REASON FOR VISIT:  1. Malignant neoplasm of ovary, unspecified laterality ([place])      HISTORY OF PRESENT ILLNESS:   Holly [name], a 61 y.o. female, was seen for a [facility] cancer genetics consultation at the request of Dr. Denman George due to a personal history of cancer.  Holly [name] presents to clinic today to discuss the possibility of a hereditary predisposition to cancer, genetic testing, and to further clarify her future cancer risks, as well as potential cancer risks for family members.   In 2018, at the age of 62, Holly [name] was diagnosed with cancer of the left ovary. This was treated with surgery.  She will see Dr. Alvy Bimler on Friday to discuss chemotherapy.     CANCER HISTORY:   No history exists.     HORMONAL RISK FACTORS:  Menarche was at age 38.  First live birth at age 50.  OCP use for approximately 0 years.  Ovaries intact: no.  Hysterectomy: yes.  Menopausal status: postmenopausal.  HRT use: 0 years. Colonoscopy: no; not examined. Mammogram within the last year: no. Number of breast biopsies: 0. Up to date with pelvic exams:  yes. Any excessive radiation exposure in the past:  no  Past Medical History:  Diagnosis Date  . Complication of anesthesia    slow to wake up sometimes   . GERD (gastroesophageal reflux disease)   . History of hiatal hernia   . Ovarian cancer ([place])   . PONV (postoperative nausea and vomiting)     Past Surgical History:  Procedure Laterality Date  . DEBULKING N/A 12/26/2016   Procedure: RADICAL TUMOR DEBULKING; OMENTECTOMY;  Surgeon: Everitt Amber, MD;  Location: [facility];  Service: Gynecology;  Laterality: N/A;  . ganglion cyst removed    . Hital Hernia    . LAPAROTOMY N/A 12/26/2016   Procedure: EXPLORATORY LAPAROTOMY;  Surgeon: Everitt Amber, MD;  Location: [facility];  Service: Gynecology;  Laterality: N/A;  . left  morton's neuroma surgery     . Nissan Fundoplication      Social History   Social History  . Marital status: Married    Spouse name: N/A  . Number of children: N/A  . Years of education: N/A   Social History Main Topics  . Smoking status: Never Smoker  . Smokeless tobacco: Never Used  . Alcohol use No  . Drug use: No  . Sexual activity: Not Asked   Other Topics Concern  . None   Social History Narrative  . None     FAMILY HISTORY:  We obtained a detailed, 4-generation family history.  Significant diagnoses are listed below: Family History  Problem Relation Age of Onset  . Diabetes Mother   . Diabetes Father   . Hypertension Father   . Stroke Father   . COPD Father   . Heart attack Sister 28  . Dementia Maternal Aunt   . Bone cancer Paternal Uncle        deceased at 40  . Dementia Maternal Grandmother   . Heart attack Paternal Grandmother   . Heart attack Paternal Grandfather   . Healthy Son   . Healthy Son     The patient has two biological sons and two adopted children who are all cancer free.  She has three grandchildren who are healthy and cancer free.  She has one sister who died at 39  from a heart attack.  Both parents are deceased.    The patient's mother died from complications of dementia.  She had two brothers and three sisters.  Two sisters also died from complications of dementia and one sister is living. One brother is alive and one died from unknown causes.  The grandparents are deceased.  The grandmother died from complications of dementia.  The patient's father died of COPD.  He was a smoker.  He had one brother who had bone cancer and died at 50.  Both grandparents died of heart attacks.  Holly [name] is unaware of previous family history of genetic testing for hereditary cancer risks. Patient's maternal ancestors are of [place] descent, and paternal ancestors are of [place] descent. There is no reported Ashkenazi Jewish ancestry. There is no known  consanguinity.  GENETIC COUNSELING ASSESSMENT: Holly [name] is a 61 y.o. female with a personal history of ovarian cancer and family history of bone cancer which is somewhat suggestive of a hereditary cancer syndrome and predisposition to cancer. We, therefore, discussed and recommended the following at today's visit.   DISCUSSION: We discussed that about 20-25% of ovarian cancer is due to hereditary causes, most commonly associated with BRCA mutations.  Other genes that are associated with hereditary causes of ovarian cancer include BRIP1, RAD51C, RAD51D, and Lynch syndrome MMR genes. Based on the paternal family history, there is a predominant number of men, and there is a chance that there could be something that is being inherited from her father's side that is being masked by the men. We reviewed the characteristics, features and inheritance patterns of hereditary cancer syndromes. We also discussed genetic testing, including the appropriate family members to test, the process of testing, insurance coverage and turn-around-time for results. We discussed the implications of a negative, positive and/or variant of uncertain significant result. We recommended Holly [name] pursue genetic testing for the [facility] gene panel. The [facility] gene panel offered by [employer] includes sequencing and deletion/duplication testing of the following 28 genes: APC, ATM, BARD1, BMPR1A, BRCA1, BRCA2, BRIP1, CHD1, CDK4, CDKN2A, CHEK2, EPCAM (large rearrangement only), MLH1, MSH2, MSH6, MUTYH, NBN, PALB2, PMS2, PTEN, RAD51C, RAD51D, SMAD4, STK11, and TP53. Sequencing was performed for select regions of POLE and POLD1, and large rearrangement analysis was performed for select regions of GREM1.   Based on Holly [name] personal history of cancer, she meets medical criteria for genetic testing. Despite that she meets criteria, she may still have an out of pocket cost. We discussed that if her out of pocket cost for  testing is over $100, the laboratory will call and confirm whether she wants to proceed with testing.  If the out of pocket cost of testing is less than $100 she will be billed by the genetic testing laboratory.   We discussed that genetic testing through [facility] will test for hereditary mutations that could explain her diagnosis of cancer.  However, homologous recombination testing (HRD) is genetic testing performed on her tumor that can determine genetic changes that could influence her management.  HRD testing is performed in tandem with genetic testing, and typically at no additional cost.Since we need to do HRD testing in tandem with genetic testing, we will need to wait until the pathology report is completed.  This will allow [facility] to be able to review the report and contact the lab for the tumor sample in order to perform testing.  PLAN: After considering the risks, benefits, and limitations, Holly [name]  provided informed  consent to pursue genetic testing.  We will wait until her pathology report is available and then schedule a blood draw.  The blood sample will be sent to [facility] for analysis of the [facility] and HRD testing. Results should be available within approximately 2-3 weeks' time, at which point they will be disclosed by telephone to Holly [name], as will any additional recommendations warranted by these results. Holly [name] will receive a summary of her genetic counseling visit and a copy of her results once available. This information will also be available in Epic. We encouraged Holly [name] to remain in contact with cancer genetics annually so that we can continuously update the family history and inform her of any changes in cancer genetics and testing that may be of benefit for her family. Holly [name] questions were answered to her satisfaction today. Our contact information was provided should additional questions or concerns arise.  Lastly, we encouraged Holly [name] to remain in  contact with cancer genetics annually so that we can continuously update the family history and inform her of any changes in cancer genetics and testing that may be of benefit for this family.   Ms.  [name] questions were answered to her satisfaction today. Our contact information was provided should additional questions or concerns arise. Thank you for the referral and allowing [place] to share in the care of your patient.   Karen P. Florene Glen, [place], [facility] Certified Genetic Counselor [email or web address] phone: [phone]  The patient was seen for a total of 45 minutes in face-to-face genetic counseling.  This patient was discussed with Drs. Magrinat, Lindi Adie and/or Burr Medico who agrees with the above.    _______________________________________________________________________ For Office Staff:  Number of people involved in session: 2 Was an Intern/ student involved with case: no

## 2016-12-30 NOTE — Telephone Encounter (Signed)
Patient called asking if she should take her BP medication this am.  Her BP is 99/54 this am and was 100/49 yesterday.  Advised her to hold her medication (Cozaar) and to call with her BP measurements daily so we can discuss the appropriate time to resume her medication.  Reportable signs and symptoms reviewed.  Advised to call for any needs as well.

## 2016-12-31 ENCOUNTER — Telehealth: Payer: Self-pay | Admitting: *Deleted

## 2016-12-31 ENCOUNTER — Encounter: Payer: Self-pay | Admitting: Hematology and Oncology

## 2016-12-31 ENCOUNTER — Other Ambulatory Visit: Payer: Self-pay | Admitting: Gynecologic Oncology

## 2016-12-31 DIAGNOSIS — G8918 Other acute postprocedural pain: Secondary | ICD-10-CM

## 2016-12-31 DIAGNOSIS — C5701 Malignant neoplasm of right fallopian tube: Secondary | ICD-10-CM | POA: Insufficient documentation

## 2016-12-31 MED ORDER — OXYCODONE-ACETAMINOPHEN 5-325 MG PO TABS: 1.0000 | ORAL_TABLET | ORAL | 0 refills | Status: DC | PRN

## 2016-12-31 NOTE — Progress Notes (Signed)
Patient stating her BP is 171/86.  Asking if she should take her medication.  Also stating she is having a hard time getting comfortable.  She states she was given tramadol 50 mg tablets at discharge and states it helps some but she needs something a little stronger to use rarely.  She will have a family member come and pick up her script.  Advised to take her BP medication and to continue to monitor her BP.  She reports no BM since surgery.  Advised she could begin use of Miralax.  She is to call for any questions or concerns or needs.

## 2016-12-31 NOTE — Telephone Encounter (Signed)
Per request fax patient records to [facility].

## 2017-01-03 ENCOUNTER — Ambulatory Visit (HOSPITAL_BASED_OUTPATIENT_CLINIC_OR_DEPARTMENT_OTHER): Payer: 59 | Admitting: Hematology and Oncology

## 2017-01-03 ENCOUNTER — Telehealth: Payer: Self-pay

## 2017-01-03 ENCOUNTER — Encounter: Payer: Self-pay | Admitting: Hematology and Oncology

## 2017-01-03 VITALS — BP 146/64 | HR 8 | Temp 98.2°F | Resp 20 | Ht 65.0 in | Wt 157.4 lb

## 2017-01-03 DIAGNOSIS — R5381 Other malaise: Secondary | ICD-10-CM | POA: Diagnosis not present

## 2017-01-03 DIAGNOSIS — I10 Essential (primary) hypertension: Secondary | ICD-10-CM | POA: Diagnosis not present

## 2017-01-03 DIAGNOSIS — C5701 Malignant neoplasm of right fallopian tube: Secondary | ICD-10-CM

## 2017-01-03 NOTE — Assessment & Plan Note (Signed)
I reviewed plan of care with the patient's and her family I recommend repeat staging information with CT scan of the chest, abdomen and pelvis with contrast before we proceed with chemotherapy I recommend port placement and chemo education class I plan to see her back next 1 to review all test results before we plan on cycle 1 of therapy

## 2017-01-03 NOTE — Progress Notes (Signed)
START ON PATHWAY REGIMEN - Ovarian     A cycle is every 21 days:     Paclitaxel      Carboplatin   **Always confirm dose/schedule in your pharmacy ordering system**    Patient Characteristics: Newly Diagnosed, Adjuvant Therapy, Stage IIIC Suboptimal and Stage IV AJCC T Category: T3 AJCC N Category: N1 AJCC M Category: M0 Therapeutic Status: Newly Diagnosed, Adjuvant Therapy AJCC 8 Stage Grouping: Unknown Intent of Therapy: Curative Intent, Discussed with Patient

## 2017-01-03 NOTE — Progress Notes (Signed)
Holly [name] CONSULT NOTE  Patient Care Team: Redmon, Holly Kirks, PA-C as PCP - General (Nurse Practitioner)  CHIEF COMPLAINTS/PURPOSE OF CONSULTATION:  Right fallopian tube cancer status post debulking surgery, for adjuvant treatment  HISTORY OF PRESENTING ILLNESS:  Holly [name] 61 y.o. female is here accompanied by her husband, her son, adopted daughter and daughter-in-law. 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 The patient has never had screening colonoscopy in the past It improved with MiraLAX but never completely gone away She started to gain weight excessively with increased abdominal girth She subsequently went to an urgent care for evaluation and was found to have large intra-abdominal mass I review her records extensively and summarized as follows:   Adenocarcinoma of right fallopian tube ([place])   08/12/2016 Initial Diagnosis    The patient has many months of vague symptoms of fullness in the pelvis, urinary frequency and difficulty with defecation. She denies vaginal bleeding or rectal bleeding.       08/12/2016 Imaging    Abdomen X-ray in the ER: Moderate colonic stool burden without evidence of enteric obstruction      11/13/2016 Imaging    TVUS was performed on 11/13/16 which showed a large hypoechoic lobular mass seen posterior to the uterus measuring 18.2x16.1x11.8cm, blood flow was seen along the periphery of the mass. It was considered to be likely to be a fibroid vs pelvic mass vs ovarian mass. Neither ovary was removed. Moderate amount of free fluid in the pelvis. THe uterus measures 4x10x4cm. The endometrium is 70mm.       12/05/2016 Imaging    MR pelvis 1. Mild limitations as detailed above. 2. Heterogeneous pelvic mass  is favored to arise from the right ovary. Favor a solid ovarian neoplasm such as fibroma/Brenner's tumor. Given lesion size and heterogeneous T2 signal out, complicating torsion cannot be excluded. 3. Moderate abdominopelvic ascites, without specific evidence of peritoneal metastasis. Consider further evaluation with contrast-enhanced abdominopelvic CT.       12/26/2016 Pathology Results    1. Uterus, ovaries and fallopian tubes - ADENOCARCINOMA INVOLVING RIGHT FALLOPIAN TUBE, RIGHT AND LEFT OVARIES, UTERINE SEROSA AND PELVIC MASS. - SEE ONCOLOGY TABLE AND COMMENT. - UTERINE CERVIX, ENDOMETRIUM, MYOMETRIUM AND LEFT FALLOPIAN TUBE FREE OF TUMOR. 2. Omentum, resection for tumor - ADENOCARCINOMA. Microscopic Comment 1. ONCOLOGY TABLE - FALLOPIAN TUBE. SEE COMMENT 1. Specimen, including laterality: Uterus, bilateral adnexa, pelvic mass and omentum. 2. Procedure: Hysterectomy with bilateral salpingo-oophorectomy, pelvic mass excision and omentectomy. 3. Lymph node sampling performed: No 4. Tumor site: See comment. 5. Tumor location in fallopian tube: Right fallopian tube fimbria 6. Specimen integrity (intact/ruptured/disrupted): Intact 7. Tumor size (cm): 18 cm, see comment. 8. Histologic type: Adenocarcinoma 9. Grade: High grade 10. Microscopic tumor extension: Tumor involves right fallopian tube, right and left ovaries, uterine serosa, pelvic mass and omentum. 11. Margins: See comment. 12. Lymph-Vascular invasion: Present 13. Lymph nodes: # examined: 0; # positive: N/A 14. TNM: pT3c, pNX 15. FIGO Stage (based on pathologic findings, needs clinical correlation: III-C 16. Comments: There is an 18 cm pelvic mass which is a high grade adenocarcinoma and there is a 12.2 cm  segment of omentum which is extensively involved with adenocarcinoma. The tumor also involves the fimbria of the right fallopian tube and the  parenchyma of the right and left ovaries as well as uterine serosa. The fimbria of  the right fallopian has intraepithelial atypia consistent with a precursor lesion and therefore this is most consistent with primary fallopian tube adenocarcinoma. A primary peritoneal serous carcinoma is a less likely possibility.       12/26/2016 Pathology Results    PERITONEAL/ASCITIC FLUID(SPECIMEN 1 OF 1 COLLECTED 12/26/16): POORLY DIFFERENTIATED ADENOCARCINOMA      12/26/2016 Surgery    Procedure(s) Performed: Exploratory laparotomy with total abdominal hysterectomy, bilateral salpingo-oophorectomy, omentectomy radical tumor debulking for ovarian cancer .  Surgeon: Holly Solo, MD.   Operative Findings: 20cm left ovarian mass densely adherent to the posterior uterus, right tube and ovary, cervix, sigmoid colon. 10cm omental cake. 4L ascites. 77mm size tumor nodules on the serosa of the terminal ileum and proximal sigmoid colon. 68mm nodules on right diaphragm.    This represented an optimal cytoreduction (R1) with 39mm nodules on intestine and diaphragm representing gross visible disease      Since surgery, she have lost 25 pounds of weight She is somewhat debilitated with mild weakness since surgery Denies wound dehiscence, infection or bleeding Her appetite is better and her bowel habits are more regular  MEDICAL HISTORY:  Past Medical History:  Diagnosis Date  . Complication of anesthesia    slow to wake up sometimes   . GERD (gastroesophageal reflux disease)   . History of hiatal hernia   . Hypertension   . Ovarian cancer ([place])   . PONV (postoperative nausea and vomiting)     SURGICAL HISTORY: Past Surgical History:  Procedure Laterality Date  . DEBULKING N/A 12/26/2016   Procedure: RADICAL TUMOR DEBULKING; OMENTECTOMY;  Surgeon: Holly Amber, MD;  Location: [facility];  Service: Gynecology;  Laterality: N/A;  . ganglion cyst removed    . Hital Hernia    . LAPAROTOMY N/A 12/26/2016   Procedure: EXPLORATORY LAPAROTOMY;  Surgeon: Holly Amber, MD;  Location: [facility];  Service:  Gynecology;  Laterality: N/A;  . left morton's neuroma surgery     . Nissan Fundoplication      SOCIAL HISTORY: Social History   Social History  . Marital status: Married    Spouse name: Holly [name]  . Number of children: 4  . Years of education: N/A   Occupational History  . Computer    Social History Main Topics  . Smoking status: Never Smoker  . Smokeless tobacco: Never Used  . Alcohol use No  . Drug use: No  . Sexual activity: Not on file   Other Topics Concern  . Not on file   Social History Narrative  . No narrative on file    FAMILY HISTORY: Family History  Problem Relation Age of Onset  . Diabetes Mother   . Diabetes Father   . Hypertension Father   . Stroke Father   . COPD Father   . Heart attack Sister 56  . Dementia Maternal Aunt   . Bone cancer Paternal Uncle        deceased at 50  . Dementia Maternal Grandmother   . Heart attack Paternal Grandmother   . Heart attack Paternal Grandfather   . Healthy Son   . Healthy Son     ALLERGIES:  is allergic to dilaudid [hydromorphone hcl] and sudafed [pseudoephedrine hcl].  MEDICATIONS:  Current Outpatient Prescriptions  Medication Sig Dispense Refill  . acetaminophen (TYLENOL) 500 MG tablet Take 1,000 mg by mouth every 8 (eight) hours as needed.     Marland Kitchen  enoxaparin (LOVENOX) 40 MG/0.4ML injection Inject 0.4 mLs (40 mg total) into the skin daily. 26 Syringe 0  . losartan (COZAAR) 25 MG tablet Take 25 mg by mouth daily.     . naproxen sodium (ANAPROX) 220 MG tablet Take 220-440 mg by mouth 2 (two) times daily as needed (pain).     Marland Kitchen oxyCODONE-acetaminophen (PERCOCET/ROXICET) 5-325 MG tablet Take 1-2 tablets by mouth every 4 (four) hours as needed for severe pain. 20 tablet 0  . traMADol (ULTRAM) 50 MG tablet Take by mouth every 6 (six) hours as needed for moderate pain.     No current facility-administered medications for this visit.     REVIEW OF SYSTEMS:   Constitutional: Denies fevers, chills or abnormal  night sweats Eyes: Denies blurriness of vision, double vision or watery eyes Ears, nose, mouth, throat, and face: Denies mucositis or sore throat Respiratory: Denies cough, dyspnea or wheezes Cardiovascular: Denies palpitation, chest discomfort or lower extremity swelling Skin: Denies abnormal skin rashes Lymphatics: Denies new lymphadenopathy or easy bruising Behavioral/Psych: Mood is stable, no new changes  All other systems were reviewed with the patient and are negative.  PHYSICAL EXAMINATION: ECOG PERFORMANCE STATUS: 1 - Symptomatic but completely ambulatory  Vitals:   01/03/17 1102  BP: (!) 146/64  Pulse: (!) 8  Resp: 20  Temp: 98.2 F (36.8 C)  SpO2: 99%   Filed Weights   01/03/17 1102  Weight: 157 lb 6.4 oz (71.4 kg)    GENERAL:alert, no distress and comfortable SKIN: skin color, texture, turgor are normal, no rashes or significant lesions EYES: normal, conjunctiva are pink and non-injected, sclera clear OROPHARYNX:no exudate, no erythema and lips, buccal mucosa, and tongue normal  NECK: supple, thyroid normal size, non-tender, without nodularity LYMPH:  no palpable lymphadenopathy in the cervical, axillary or inguinal LUNGS: clear to auscultation and percussion with normal breathing effort HEART: regular rate & rhythm and no murmurs and no lower extremity edema ABDOMEN:abdomen soft, non-tender and normal bowel sounds.  Noted well-healed surgical scar Musculoskeletal:no cyanosis of digits and no clubbing  PSYCH: alert & oriented x 3 with fluent speech NEURO: no focal motor/sensory deficits  LABORATORY DATA:  I have reviewed the data as listed Lab Results  Component Value Date   WBC 12.4 (H) 12/27/2016   HGB 11.6 (L) 12/27/2016   HCT 35.2 (L) 12/27/2016   MCV 88.9 12/27/2016   PLT 272 12/27/2016    Recent Labs  12/19/16 0921 12/27/16 0503  NA 138 135  K 4.9 4.6  CL 104 100*  CO2 26 25  GLUCOSE 104* 175*  BUN 10 9  CREATININE 0.92 0.74  CALCIUM 9.4  8.7*  GFRNONAA >60 >60  GFRAA >60 >60  PROT 7.0  --   ALBUMIN 3.8  --   AST 18  --   ALT 8*  --   ALKPHOS 55  --   BILITOT 0.8  --     RADIOGRAPHIC STUDIES: I have personally reviewed the radiological images as listed and agreed with the findings in the report. Mr Pelvis Wo Contrast  Result Date: 12/05/2016 CLINICAL DATA:  Pelvic mass.  Ultrasound demonstrating a fibroid. EXAM: MRI PELVIS WITHOUT CONTRAST TECHNIQUE: Multiplanar multisequence MR imaging of the pelvis was performed. No intravenous contrast was administered. COMPARISON:  None.  No prior ultrasound submitted. FINDINGS: Urinary Tract:  No distal hydroureter.  Normal urinary bladder. Bowel:  No bowel obstruction. Vascular/Lymphatic: Normal caliber of pelvic vasculature. No pelvic sidewall adenopathy. Reproductive: Uterus is compressed inferiorly, but is  felt to be within normal limits. Endometrium is normal at 5 mm on sagittal image 13/series 3. Left ovary area is likely identified inferiorly and anteriorly on image 29/series 4. A heterogeneous, mildly T2 hyperintense, T1 hypointense central pelvic mass measures on the order of 13.9 x 11.9 by 18.3 cm. Favored to arise from the right ovary, including on image 21/series 4 and image 23/series 8. Other: Moderate volume abdominopelvic ascites. No well-defined peritoneal metastasis within the pelvis. Musculoskeletal: No acute osseous abnormality. Trace fluid in bilateral hips is favored to be physiologic. Mild motion and likely patient body habitus degradation. IMPRESSION: 1. Mild limitations as detailed above. 2. Heterogeneous pelvic mass is favored to arise from the right ovary. Favor a solid ovarian neoplasm such as fibroma/Brenner's tumor. Given lesion size and heterogeneous T2 signal out, complicating torsion cannot be excluded. 3. Moderate abdominopelvic ascites, without specific evidence of peritoneal metastasis. Consider further evaluation with contrast-enhanced abdominopelvic CT. These  results will be called to the ordering clinician or representative by the Radiologist Assistant, and communication documented in the PACS or zVision Dashboard. Electronically Signed   By: Abigail Miyamoto M.D.   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[place]) I reviewed plan of care with the patient's and her family I recommend repeat staging information with CT scan of the chest, abdomen and pelvis with contrast before we proceed with chemotherapy I recommend port placement and chemo education class I plan to see her back next 1 to review all test results before we plan on cycle 1 of therapy  Physical debility The patient have lost over 25 pounds of weight She appeared debilitated I spent some time getting disability parking permit available and ready for the patient I advised her not to return back to work in a short-term until she complete 6 cycles of chemotherapy in an adjuvant fashion  Orders Placed This Encounter  Procedures  . CT ABDOMEN PELVIS W CONTRAST    Standing Status:   Future    Standing Expiration Date:   01/03/2018    Order Specific Question:   If indicated for the ordered procedure, I authorize the administration of contrast media per Radiology protocol    Answer:   Yes    Order Specific Question:   Preferred imaging location?    Answer:   [facility]    Order Specific Question:   Radiology Contrast Protocol - do NOT remove file path    Answer:   \\[email or web address]  . CT CHEST W CONTRAST    Standing Status:   Future    Standing Expiration Date:   01/03/2018    Order Specific Question:   If indicated for the ordered procedure, I authorize the administration of contrast media per Radiology protocol    Answer:   Yes    Order Specific Question:   Preferred imaging location?    Answer:   [facility]    Order Specific Question:   Radiology Contrast Protocol - do NOT remove file path     Answer:   \\[email or web address]  . IR FLUORO GUIDE PORT INSERTION RIGHT    Standing Status:   Future    Standing Expiration Date:   03/05/2018    Order Specific Question:   Reason for Exam (SYMPTOM  OR DIAGNOSIS REQUIRED)    Answer:   need port around 10/1    Order Specific Question:   Preferred Imaging Location?    Answer:   [facility]  . CBC with Differential/Platelet    Standing Status:   Standing    Number of Occurrences:   22    Standing Expiration Date:   01/03/2018  . Comprehensive metabolic panel    Standing Status:   Standing    Number of Occurrences:   22    Standing Expiration Date:   01/03/2018  . CA 125    Standing Status:   Standing    Number of Occurrences:   9    Standing Expiration Date:   01/03/2018     All questions were answered. The patient knows to call the clinic with any problems, questions or concerns. I spent 55 minutes counseling the patient face to face. The total time spent in the appointment was 80 minutes and more than 50% was on counseling.     Heath Lark, MD 01/03/2017 3:10 PM

## 2017-01-03 NOTE — Assessment & Plan Note (Signed)
The patient have lost over 25 pounds of weight She appeared debilitated I spent some time getting disability parking permit available and ready for the patient I advised her not to return back to work in a short-term until she complete 6 cycles of chemotherapy in an adjuvant fashion

## 2017-01-03 NOTE — Telephone Encounter (Signed)
Printed avs and calender for October appointments. Per 9/14 los

## 2017-01-09 ENCOUNTER — Telehealth: Payer: Self-pay | Admitting: Genetic Counselor

## 2017-01-09 NOTE — Telephone Encounter (Signed)
LM on VM that I see her pathology report is back and that we can leave a kit in the lab on 10/1 for a blood draw.  Asked that she CB to confirm that she wanted to proceed with testing.

## 2017-01-09 NOTE — Telephone Encounter (Signed)
Patient confirmed that she wants to have her blood drawn on 10/1 for genetic testing.  We will leave a kit in the lab.

## 2017-01-10 ENCOUNTER — Other Ambulatory Visit: Payer: Self-pay | Admitting: *Deleted

## 2017-01-10 ENCOUNTER — Ambulatory Visit: Payer: 59 | Attending: Gynecologic Oncology | Admitting: Gynecologic Oncology

## 2017-01-10 ENCOUNTER — Encounter: Payer: Self-pay | Admitting: Gynecologic Oncology

## 2017-01-10 VITALS — BP 137/61 | HR 65 | Temp 98.2°F | Resp 20 | Wt 156.6 lb

## 2017-01-10 DIAGNOSIS — I10 Essential (primary) hypertension: Secondary | ICD-10-CM | POA: Insufficient documentation

## 2017-01-10 DIAGNOSIS — Z8543 Personal history of malignant neoplasm of ovary: Secondary | ICD-10-CM | POA: Insufficient documentation

## 2017-01-10 DIAGNOSIS — K449 Diaphragmatic hernia without obstruction or gangrene: Secondary | ICD-10-CM | POA: Insufficient documentation

## 2017-01-10 DIAGNOSIS — C786 Secondary malignant neoplasm of retroperitoneum and peritoneum: Secondary | ICD-10-CM | POA: Diagnosis not present

## 2017-01-10 DIAGNOSIS — Z7189 Other specified counseling: Secondary | ICD-10-CM

## 2017-01-10 DIAGNOSIS — K219 Gastro-esophageal reflux disease without esophagitis: Secondary | ICD-10-CM | POA: Insufficient documentation

## 2017-01-10 DIAGNOSIS — R1900 Intra-abdominal and pelvic swelling, mass and lump, unspecified site: Secondary | ICD-10-CM | POA: Diagnosis present

## 2017-01-10 DIAGNOSIS — C5701 Malignant neoplasm of right fallopian tube: Secondary | ICD-10-CM | POA: Diagnosis not present

## 2017-01-10 DIAGNOSIS — Z9221 Personal history of antineoplastic chemotherapy: Secondary | ICD-10-CM | POA: Insufficient documentation

## 2017-01-10 DIAGNOSIS — Z90710 Acquired absence of both cervix and uterus: Secondary | ICD-10-CM | POA: Diagnosis not present

## 2017-01-10 DIAGNOSIS — C5700 Malignant neoplasm of unspecified fallopian tube: Secondary | ICD-10-CM | POA: Diagnosis not present

## 2017-01-10 MED ORDER — TRAMADOL HCL 50 MG PO TABS: 50.0000 mg | ORAL_TABLET | Freq: Four times a day (QID) | ORAL | 0 refills | Status: DC | PRN

## 2017-01-10 NOTE — Patient Instructions (Signed)
Plan to follow up with Dr. Alvy Bimler as scheduled.  You will see Dr. Denman George at the completion of treatment.  Please call for any needs or concerns.

## 2017-01-10 NOTE — Progress Notes (Signed)
Consult Note: Gyn-Onc  Consult was requested by Dr. Simona Huh for the evaluation of Holly [name] 61 y.o. female  CC:  Chief Complaint  Patient presents with  . Pelvic mass    Assessment/Plan:  Holly [name]  is a 61 y.o.  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  She has healed well from surgery. She is cleared for chemotherapy.  We discussed her disease, likelihood of response to therapy, and plan for surveillance after treatment. She will receive 6 cycles adjuvant carb/tax.  Genetics referral.  HPI: Holly [name] is a 61 year old parous woman who is seen in consultation at the request of Dr Simona Huh for a large pelvic mass. The patient has 3 months of vague symptoms of fullness in the pelvis, urinary frequency and difficulty with defecation. She denies vaginal bleeding or rectal bleeding.  She was seen in an urgen care in July, 2018 and a TVUS was performed on 11/13/16 which showed a large hypoechoic lobular mass seen posterior to the uterus measuring 18.2x16.1x11.8cm, blood flow was seen along the periphery of the mass. It was considered to be likely to be a fibroid vs pelvic mass vs ovarian mass. Neither ovary was removed. Moderate amount of free fluid in the pelvis. THe uterus measures 4x10x4cm. The endometrium is 58mm.  An MRI was performed on 12/05/16 which showed a 13.9x11.9x18.3cm T1 hypointense central pelvic mass favoring to arise from the right ovary. No peritoneal metastases were identified in the pelvis. They favor a solid ovarian neoplasm such as a brenner tumor or fibroma.  The patient is otherwise quite helathy. She has had 2 prior vaginal deliveries and a nissan fundoplication.   Interval Hx: On 12/26/16 she underwent ex lap, TAH, BSO, omentectomy, radical debulking surgery. Intraoperative findings were significant for a 20cm mass from the left ovary/tube and omental cake and 4L  ascites. The only residual disease at the completion of surgery was small 35mm milial implants on the intestines representing an optimal cytoreduction (R1). Final pathology revealed stage IIIC high grade serous carcinoma from the left fallopian tube metastatic to the ascites and omentum. In accordance with NCCN guidelines she was recommended to receive 6 cycles of adjuvant carboplatin and paclitaxel chemotherapy and referral for genetics counseling.  Postop she has done very well with no issues or complaints. Her pain is well controlled. She is eating a regular diet.  Current Meds:  Outpatient Encounter Prescriptions as of 01/10/2017  Medication Sig  . enoxaparin (LOVENOX) 40 MG/0.4ML injection Inject 0.4 mLs (40 mg total) into the skin daily.  Marland Kitchen losartan (COZAAR) 25 MG tablet Take 25 mg by mouth daily.   . naproxen sodium (ANAPROX) 220 MG tablet Take 220-440 mg by mouth 2 (two) times daily as needed (pain).   . traMADol (ULTRAM) 50 MG tablet Take by mouth every 6 (six) hours as needed for moderate pain.  . [DISCONTINUED] acetaminophen (TYLENOL) 500 MG tablet Take 1,000 mg by mouth every 8 (eight) hours as needed.   . [DISCONTINUED] oxyCODONE-acetaminophen (PERCOCET/ROXICET) 5-325 MG tablet Take 1-2 tablets by mouth every 4 (four) hours as needed for severe pain.   No facility-administered encounter medications on file as of 01/10/2017.     Allergy:  Allergies  Allergen Reactions  . Dilaudid [Hydromorphone Hcl] Nausea And Vomiting  . Sudafed [Pseudoephedrine Hcl] Other (See Comments)    Nervous/jittery    Social Hx:   Social History  Social History  . Marital status: Married    Spouse name: [name]  . Number of children: 4  . Years of education: N/A   Occupational History  . Computer    Social History Main Topics  . Smoking status: Never Smoker  . Smokeless tobacco: Never Used  . Alcohol use No  . Drug use: No  . Sexual activity: Not on file   Other Topics Concern  . Not on  file   Social History Narrative  . No narrative on file    Past Surgical Hx:  Past Surgical History:  Procedure Laterality Date  . DEBULKING N/A 12/26/2016   Procedure: RADICAL TUMOR DEBULKING; OMENTECTOMY;  Surgeon: Everitt Amber, MD;  Location: [facility];  Service: Gynecology;  Laterality: N/A;  . ganglion cyst removed    . Hital Hernia    . LAPAROTOMY N/A 12/26/2016   Procedure: EXPLORATORY LAPAROTOMY;  Surgeon: Everitt Amber, MD;  Location: [facility];  Service: Gynecology;  Laterality: N/A;  . left morton's neuroma surgery     . Nissan Fundoplication      Past Medical Hx:  Past Medical History:  Diagnosis Date  . Complication of anesthesia    slow to wake up sometimes   . GERD (gastroesophageal reflux disease)   . History of hiatal hernia   . Hypertension   . Ovarian cancer ([place])   . PONV (postoperative nausea and vomiting)     Past Gynecological History:  SVD x 2 No LMP recorded. Patient is postmenopausal.  Family Hx:  Family History  Problem Relation Age of Onset  . Diabetes Mother   . Diabetes Father   . Hypertension Father   . Stroke Father   . COPD Father   . Heart attack Sister 79  . Dementia Maternal Aunt   . Bone cancer Paternal Uncle        deceased at 52  . Dementia Maternal Grandmother   . Heart attack Paternal Grandmother   . Heart attack Paternal Grandfather   . Healthy Son   . Healthy Son     Review of Systems:  Constitutional  Feels well,    ENT Normal appearing ears and nares bilaterally Skin/Breast  No rash, sores, jaundice, itching, dryness Cardiovascular  No chest pain, shortness of breath, or edema  Pulmonary  No cough or wheeze.  Gastro Intestinal  No nausea, vomitting, or diarrhoea. No bright red blood per rectum, no abdominal pain, change in bowel movement, or constipation.  Genito Urinary  No frequency, no urgency, dysuria, bleeding Musculo Skeletal  No myalgia, arthralgia, joint swelling or pain  Neurologic  No weakness, numbness,  change in gait,  Psychology  No depression, anxiety, insomnia.   Vitals:  Blood pressure 137/61, pulse 65, temperature 98.2 F (36.8 C), resp. rate 20, weight 156 lb 9.6 oz (71 kg), SpO2 100 %.  Physical Exam: WD in NAD Neck  Supple NROM, without any enlargements.  Lymph Node Survey No cervical supraclavicular or inguinal adenopathy Cardiovascular  Pulse normal rate, regularity and rhythm. S1 and S2 normal.  Lungs  Clear to auscultation bilateraly, without wheezes/crackles/rhonchi. Good air movement.  Skin  No rash/lesions/breakdown  Psychiatry  Alert and oriented to person, place, and time  Abdomen  Normoactive bowel sounds, abdomen soft, non-tender and nonobese without evidence of hernia. Well healed right paramedian incision - no sign of infection. Back No CVA tenderness Genito Urinary  Vulva/vagina: Normal external female genitalia.   No lesions. No discharge or bleeding. Vaginal cuff in tact  and well healed. Rectal  Nodularity on surface of mass appreciated but does not feel invasive into rectum and no separate peritoneal nodularity. Extremities  No bilateral cyanosis, clubbing or edema.   30 minutes of direct face to face counseling time was spent with the patient. This included discussion about prognosis, therapy recommendations and postoperative side effects and are beyond the scope of routine postoperative care.   Donaciano Eva, MD  01/10/2017, 12:55 PM

## 2017-01-14 ENCOUNTER — Other Ambulatory Visit: Payer: Self-pay | Admitting: Radiology

## 2017-01-15 ENCOUNTER — Other Ambulatory Visit: Payer: Self-pay | Admitting: Radiology

## 2017-01-16 ENCOUNTER — Other Ambulatory Visit: Payer: Self-pay | Admitting: Hematology and Oncology

## 2017-01-16 ENCOUNTER — Ambulatory Visit (HOSPITAL_COMMUNITY)
Admission: RE | Admit: 2017-01-16 | Discharge: 2017-01-16 | Disposition: A | Discharge status: Home / Self Care | Payer: 59 | Source: Ambulatory Visit | Attending: Hematology and Oncology | Admitting: Hematology and Oncology

## 2017-01-16 ENCOUNTER — Encounter (HOSPITAL_COMMUNITY): Payer: Self-pay

## 2017-01-16 DIAGNOSIS — I10 Essential (primary) hypertension: Secondary | ICD-10-CM | POA: Insufficient documentation

## 2017-01-16 DIAGNOSIS — Z8543 Personal history of malignant neoplasm of ovary: Secondary | ICD-10-CM | POA: Insufficient documentation

## 2017-01-16 DIAGNOSIS — C5701 Malignant neoplasm of right fallopian tube: Secondary | ICD-10-CM | POA: Diagnosis not present

## 2017-01-16 HISTORY — PX: IR FLUORO GUIDE PORT INSERTION RIGHT: IMG5741

## 2017-01-16 HISTORY — PX: IR US GUIDE VASC ACCESS RIGHT: IMG2390

## 2017-01-16 LAB — CBC WITH DIFFERENTIAL/PLATELET
BASOS PCT: 1 %
Basophils Absolute: 0.1 10*3/uL (ref 0.0–0.1)
EOS ABS: 1.4 10*3/uL — AB (ref 0.0–0.7)
Eosinophils Relative: 24 %
HCT: 35.1 % — ABNORMAL LOW (ref 36.0–46.0)
Hemoglobin: 11.4 g/dL — ABNORMAL LOW (ref 12.0–15.0)
Lymphocytes Relative: 35 %
Lymphs Abs: 2.1 10*3/uL (ref 0.7–4.0)
MCH: 29.2 pg (ref 26.0–34.0)
MCHC: 32.5 g/dL (ref 30.0–36.0)
MCV: 89.8 fL (ref 78.0–100.0)
MONOS PCT: 5 %
Monocytes Absolute: 0.3 10*3/uL (ref 0.1–1.0)
NEUTROS PCT: 35 %
Neutro Abs: 2.1 10*3/uL (ref 1.7–7.7)
Platelets: 257 10*3/uL (ref 150–400)
RBC: 3.91 MIL/uL (ref 3.87–5.11)
RDW: 14.1 % (ref 11.5–15.5)
WBC: 6 10*3/uL (ref 4.0–10.5)

## 2017-01-16 LAB — PROTIME-INR
INR: 1.09
PROTHROMBIN TIME: 14 s (ref 11.4–15.2)

## 2017-01-16 MED ORDER — MIDAZOLAM HCL 2 MG/2ML IJ SOLN
INTRAMUSCULAR | Status: AC
  Filled 2017-01-16: qty 4

## 2017-01-16 MED ORDER — LIDOCAINE HCL 2 % IJ SOLN
INTRAMUSCULAR | Status: AC | PRN
  Administered 2017-01-16: 10 mL via INTRADERMAL

## 2017-01-16 MED ORDER — FENTANYL CITRATE (PF) 100 MCG/2ML IJ SOLN
INTRAMUSCULAR | Status: AC | PRN
  Administered 2017-01-16: 50 ug via INTRAVENOUS
  Administered 2017-01-16: 25 ug via INTRAVENOUS

## 2017-01-16 MED ORDER — SODIUM CHLORIDE 0.9 % IV SOLN
INTRAVENOUS | Status: DC
  Administered 2017-01-16: 08:00:00 via INTRAVENOUS

## 2017-01-16 MED ORDER — LIDOCAINE HCL 1 % IJ SOLN
INTRAMUSCULAR | Status: AC
  Filled 2017-01-16: qty 20

## 2017-01-16 MED ORDER — FENTANYL CITRATE (PF) 100 MCG/2ML IJ SOLN
INTRAMUSCULAR | Status: AC
  Filled 2017-01-16: qty 4

## 2017-01-16 MED ORDER — CEFAZOLIN SODIUM-DEXTROSE 2-4 GM/100ML-% IV SOLN
2.0000 g | INTRAVENOUS | Status: AC
  Administered 2017-01-16: 2 g via INTRAVENOUS

## 2017-01-16 MED ORDER — HEPARIN SOD (PORK) LOCK FLUSH 100 UNIT/ML IV SOLN
INTRAVENOUS | Status: AC
  Filled 2017-01-16: qty 5

## 2017-01-16 MED ORDER — MIDAZOLAM HCL 2 MG/2ML IJ SOLN
INTRAMUSCULAR | Status: AC | PRN
  Administered 2017-01-16 (×2): 1 mg via INTRAVENOUS

## 2017-01-16 MED ORDER — CEFAZOLIN SODIUM-DEXTROSE 2-4 GM/100ML-% IV SOLN
INTRAVENOUS | Status: AC
  Filled 2017-01-16: qty 100

## 2017-01-16 NOTE — Procedures (Signed)
Interventional Radiology Procedure Note  Procedure: Placement of a right IJ approach single lumen PowerPort.  Tip is positioned at the superior cavoatrial junction and catheter is ready for immediate use.  Complications: No immediate Recommendations:  - Ok to shower tomorrow - Do not submerge for 7 days - Routine line care   Holly [name] T. Mike Berntsen, M.D Pager:  [phone]   

## 2017-01-16 NOTE — Consult Note (Signed)
Chief Complaint: Patient was seen in consultation today for Port-A-Cath placement  Referring Physician(s): Plummer  Supervising Physician: Aletta Edouard  Patient Status: [facility] - Out-pt  History of Present Illness: Holly [name] is a 61 y.o. female with history of right fallopian tube carcinoma, status post debulking surgery on 12/26/16. She presents today for Port-A-Cath placement for chemotherapy.  Past Medical History:  Diagnosis Date  . Complication of anesthesia    slow to wake up sometimes   . GERD (gastroesophageal reflux disease)   . History of hiatal hernia   . Hypertension   . Ovarian cancer ([place])   . PONV (postoperative nausea and vomiting)     Past Surgical History:  Procedure Laterality Date  . DEBULKING N/A 12/26/2016   Procedure: RADICAL TUMOR DEBULKING; OMENTECTOMY;  Surgeon: Everitt Amber, MD;  Location: [facility] ORS;  Service: Gynecology;  Laterality: N/A;  . ganglion cyst removed    . Hital Hernia    . LAPAROTOMY N/A 12/26/2016   Procedure: EXPLORATORY LAPAROTOMY;  Surgeon: Everitt Amber, MD;  Location: [facility] ORS;  Service: Gynecology;  Laterality: N/A;  . left morton's neuroma surgery     . Nissan Fundoplication      Allergies: Dilaudid [hydromorphone hcl] and Sudafed [pseudoephedrine hcl]  Medications: Prior to Admission medications   Medication Sig Start Date End Date Taking? Authorizing Provider  losartan (COZAAR) 25 MG tablet Take 25 mg by mouth daily.    Yes [provider]  naproxen sodium (ANAPROX) 220 MG tablet Take 220-440 mg by mouth 2 (two) times daily as needed (pain).    Yes [provider]  enoxaparin (LOVENOX) 40 MG/0.4ML injection Inject 0.4 mLs (40 mg total) into the skin daily. 12/29/16 01/24/17  Lahoma Crocker, MD  traMADol (ULTRAM) 50 MG tablet Take 1 tablet (50 mg total) by mouth every 6 (six) hours as needed for moderate pain. 01/10/17   Heath Lark, MD     Family History  Problem Relation Age of Onset  . Diabetes  Mother   . Diabetes Father   . Hypertension Father   . Stroke Father   . COPD Father   . Heart attack Sister 11  . Dementia Maternal Aunt   . Bone cancer Paternal Uncle        deceased at 71  . Dementia Maternal Grandmother   . Heart attack Paternal Grandmother   . Heart attack Paternal Grandfather   . Healthy Son   . Healthy Son     Social History   Social History  . Marital status: Married    Spouse name: [name]  . Number of children: 4  . Years of education: N/A   Occupational History  . Computer    Social History Main Topics  . Smoking status: Never Smoker  . Smokeless tobacco: Never Used  . Alcohol use No  . Drug use: No  . Sexual activity: Not Asked   Other Topics Concern  . None   Social History Narrative  . None      Review of Systems patient currently denies fever, headache, chest pain, dyspnea, cough, back pain, nausea, vomiting or bleeding. She does have some mild abdominal tenderness at surgical site.  Vital Signs: BP (!) 144/99   Pulse 71   Temp 98.1 F (36.7 C) (Oral)   Resp 16   Ht 5\' 11"  (1.803 m)   Wt 154 lb 8 oz (70.1 kg)   SpO2 97%   BMI 21.55 kg/m   Physical Exam awake, alert.  Chest clear to auscultation bilaterally. Heart with regular rate and rhythm. Abdomen soft, clean midline surgical scar, mildly tender to palpation; no lower extremity edema  Imaging: No results found.  Labs:  CBC:  Recent Labs  12/19/16 0921 12/27/16 0503 01/16/17 0758  WBC 6.7 12.4* 6.0  HGB 13.1 11.6* 11.4*  HCT 40.0 35.2* 35.1*  PLT 303 272 257    COAGS: No results for input(s): INR, APTT in the last 8760 hours.  BMP:  Recent Labs  12/19/16 0921 12/27/16 0503  NA 138 135  K 4.9 4.6  CL 104 100*  CO2 26 25  GLUCOSE 104* 175*  BUN 10 9  CALCIUM 9.4 8.7*  CREATININE 0.92 0.74  GFRNONAA >60 >60  GFRAA >60 >60    LIVER FUNCTION TESTS:  Recent Labs  12/19/16 0921  BILITOT 0.8  AST 18  ALT 8*  ALKPHOS 55  PROT 7.0  ALBUMIN  3.8    TUMOR MARKERS: No results for input(s): AFPTM, CEA, CA199, CHROMGRNA in the last 8760 hours.  Assessment and Plan: 61 y.o. female with history of right fallopian tube carcinoma, status post debulking surgery on 12/26/16. She presents today for Port-A-Cath placement for chemotherapy.Risks and benefits discussed with the patient/family including, but not limited to bleeding, infection, pneumothorax, or fibrin sheath development and need for additional procedures.All of the patient's questions were answered, patient is agreeable to proceed.Consent signed and in chart.     Thank you for this interesting consult.  I greatly enjoyed meeting DEKLYN TRACHTENBERG and look forward to participating in their care.  A copy of this report was sent to the requesting provider on this date.  Electronically Signed: D. Rowe Robert, PA-C 01/16/2017, 8:37 AM   I spent a total of 25 minutes in face to face in clinical consultation, greater than 50% of which was counseling/coordinating care for Port-A-Cath placement

## 2017-01-17 ENCOUNTER — Ambulatory Visit (HOSPITAL_COMMUNITY)
Admission: RE | Admit: 2017-01-17 | Discharge: 2017-01-17 | Disposition: A | Discharge status: Home / Self Care | Payer: 59 | Source: Ambulatory Visit | Attending: Hematology and Oncology | Admitting: Hematology and Oncology

## 2017-01-17 ENCOUNTER — Other Ambulatory Visit: Payer: Self-pay | Admitting: Hematology and Oncology

## 2017-01-17 ENCOUNTER — Encounter (HOSPITAL_COMMUNITY): Payer: Self-pay

## 2017-01-17 DIAGNOSIS — C5701 Malignant neoplasm of right fallopian tube: Secondary | ICD-10-CM | POA: Diagnosis not present

## 2017-01-17 DIAGNOSIS — R590 Localized enlarged lymph nodes: Secondary | ICD-10-CM | POA: Diagnosis not present

## 2017-01-17 DIAGNOSIS — R935 Abnormal findings on diagnostic imaging of other abdominal regions, including retroperitoneum: Secondary | ICD-10-CM | POA: Diagnosis not present

## 2017-01-17 MED ORDER — IOPAMIDOL (ISOVUE-300) INJECTION 61%
100.0000 mL | Freq: Once | INTRAVENOUS | Status: AC | PRN
  Administered 2017-01-17: 100 mL via INTRAVENOUS

## 2017-01-17 MED ORDER — IOPAMIDOL (ISOVUE-300) INJECTION 61%
INTRAVENOUS | Status: AC
  Administered 2017-01-17: 100 mL via INTRAVENOUS
  Filled 2017-01-17: qty 100

## 2017-01-20 ENCOUNTER — Other Ambulatory Visit: Payer: 59

## 2017-01-20 ENCOUNTER — Ambulatory Visit (HOSPITAL_BASED_OUTPATIENT_CLINIC_OR_DEPARTMENT_OTHER): Payer: 59 | Admitting: Hematology and Oncology

## 2017-01-20 ENCOUNTER — Other Ambulatory Visit (HOSPITAL_BASED_OUTPATIENT_CLINIC_OR_DEPARTMENT_OTHER): Payer: 59

## 2017-01-20 VITALS — BP 135/64 | HR 75 | Temp 98.2°F | Resp 18 | Ht 71.0 in | Wt 156.6 lb

## 2017-01-20 DIAGNOSIS — C5701 Malignant neoplasm of right fallopian tube: Secondary | ICD-10-CM | POA: Diagnosis not present

## 2017-01-20 DIAGNOSIS — Z23 Encounter for immunization: Secondary | ICD-10-CM | POA: Diagnosis not present

## 2017-01-20 DIAGNOSIS — C786 Secondary malignant neoplasm of retroperitoneum and peritoneum: Secondary | ICD-10-CM | POA: Diagnosis not present

## 2017-01-20 DIAGNOSIS — Z7189 Other specified counseling: Secondary | ICD-10-CM

## 2017-01-20 LAB — CBC WITH DIFFERENTIAL/PLATELET
BASO%: 1.2 % (ref 0.0–2.0)
Basophils Absolute: 0.1 10*3/uL (ref 0.0–0.1)
EOS%: 29.3 % — AB (ref 0.0–7.0)
Eosinophils Absolute: 2.6 10*3/uL — ABNORMAL HIGH (ref 0.0–0.5)
HCT: 36.9 % (ref 34.8–46.6)
HGB: 12.1 g/dL (ref 11.6–15.9)
LYMPH%: 31.7 % (ref 14.0–49.7)
MCH: 29.7 pg (ref 25.1–34.0)
MCHC: 32.9 g/dL (ref 31.5–36.0)
MCV: 90.1 fL (ref 79.5–101.0)
MONO#: 0.4 10*3/uL (ref 0.1–0.9)
MONO%: 5.1 % (ref 0.0–14.0)
NEUT%: 32.7 % — ABNORMAL LOW (ref 38.4–76.8)
NEUTROS ABS: 2.9 10*3/uL (ref 1.5–6.5)
Platelets: 243 10*3/uL (ref 145–400)
RBC: 4.09 10*6/uL (ref 3.70–5.45)
RDW: 14.6 % — ABNORMAL HIGH (ref 11.2–14.5)
WBC: 8.8 10*3/uL (ref 3.9–10.3)
lymph#: 2.8 10*3/uL (ref 0.9–3.3)

## 2017-01-20 LAB — COMPREHENSIVE METABOLIC PANEL
ALT: 11 U/L (ref 0–55)
AST: 17 U/L (ref 5–34)
Albumin: 3.8 g/dL (ref 3.5–5.0)
Alkaline Phosphatase: 64 U/L (ref 40–150)
Anion Gap: 9 mEq/L (ref 3–11)
BILIRUBIN TOTAL: 0.45 mg/dL (ref 0.20–1.20)
BUN: 7.7 mg/dL (ref 7.0–26.0)
CHLORIDE: 106 meq/L (ref 98–109)
CO2: 27 meq/L (ref 22–29)
CREATININE: 1 mg/dL (ref 0.6–1.1)
Calcium: 9.6 mg/dL (ref 8.4–10.4)
EGFR: 62 mL/min/{1.73_m2} — ABNORMAL LOW (ref >90)
GLUCOSE: 96 mg/dL (ref 70–140)
Potassium: 4.4 mEq/L (ref 3.5–5.1)
SODIUM: 142 meq/L (ref 136–145)
TOTAL PROTEIN: 7.1 g/dL (ref 6.4–8.3)

## 2017-01-20 MED ORDER — ONDANSETRON HCL 8 MG PO TABS: 8.0000 mg | ORAL_TABLET | Freq: Three times a day (TID) | ORAL | 1 refills | Status: DC | PRN

## 2017-01-20 MED ORDER — INFLUENZA VAC SPLIT QUAD 0.5 ML IM SUSY
0.5000 mL | PREFILLED_SYRINGE | Freq: Once | INTRAMUSCULAR | Status: AC
  Administered 2017-01-20: 0.5 mL via INTRAMUSCULAR
  Filled 2017-01-20: qty 0.5

## 2017-01-20 MED ORDER — DEXAMETHASONE 4 MG PO TABS: ORAL_TABLET | ORAL | 1 refills | Status: DC

## 2017-01-20 MED ORDER — PROCHLORPERAZINE MALEATE 10 MG PO TABS: 10.0000 mg | ORAL_TABLET | Freq: Four times a day (QID) | ORAL | 1 refills | Status: DC | PRN

## 2017-01-20 MED ORDER — LIDOCAINE-PRILOCAINE 2.5-2.5 % EX CREA: TOPICAL_CREAM | CUTANEOUS | 3 refills | Status: DC

## 2017-01-20 MED FILL — DEXAMETHASONE 4 MG TABLET: 4 | 21 days supply | Qty: 10 | Fill #0

## 2017-01-20 MED FILL — PROCHLORPERAZINE 10 MG TAB: 10 | 7 days supply | Qty: 30 | Fill #0

## 2017-01-20 MED FILL — LIDOCAINE-PRILOCAINE CREAM: 2.5-2.5 | 10 days supply | Qty: 30 | Fill #0

## 2017-01-20 MED FILL — ONDANSETRON HCL 8 MG TABLET: 8 | 10 days supply | Qty: 30 | Fill #0

## 2017-01-21 ENCOUNTER — Ambulatory Visit (HOSPITAL_BASED_OUTPATIENT_CLINIC_OR_DEPARTMENT_OTHER): Payer: 59

## 2017-01-21 ENCOUNTER — Encounter: Payer: Self-pay | Admitting: Hematology and Oncology

## 2017-01-21 ENCOUNTER — Other Ambulatory Visit: Payer: Self-pay | Admitting: Hematology and Oncology

## 2017-01-21 VITALS — BP 164/74 | HR 56 | Temp 97.9°F | Resp 16

## 2017-01-21 DIAGNOSIS — Z5111 Encounter for antineoplastic chemotherapy: Secondary | ICD-10-CM

## 2017-01-21 DIAGNOSIS — C5701 Malignant neoplasm of right fallopian tube: Secondary | ICD-10-CM | POA: Diagnosis not present

## 2017-01-21 DIAGNOSIS — Z7189 Other specified counseling: Secondary | ICD-10-CM | POA: Insufficient documentation

## 2017-01-21 DIAGNOSIS — C786 Secondary malignant neoplasm of retroperitoneum and peritoneum: Secondary | ICD-10-CM | POA: Diagnosis not present

## 2017-01-21 LAB — CA 125: Cancer Antigen (CA) 125: 46.6 U/mL — ABNORMAL HIGH (ref 0.0–38.1)

## 2017-01-21 MED ORDER — SODIUM CHLORIDE 0.9% FLUSH
10.0000 mL | INTRAVENOUS | Status: DC | PRN
  Administered 2017-01-21: 10 mL
  Filled 2017-01-21: qty 10

## 2017-01-21 MED ORDER — PALONOSETRON HCL INJECTION 0.25 MG/5ML
0.2500 mg | Freq: Once | INTRAVENOUS | Status: AC
  Administered 2017-01-21: 0.25 mg via INTRAVENOUS

## 2017-01-21 MED ORDER — SODIUM CHLORIDE 0.9 % IV SOLN
544.2000 mg | Freq: Once | INTRAVENOUS | Status: AC
  Administered 2017-01-21: 540 mg via INTRAVENOUS
  Filled 2017-01-21: qty 54

## 2017-01-21 MED ORDER — SODIUM CHLORIDE 0.9 % IV SOLN
Freq: Once | INTRAVENOUS | Status: AC
  Administered 2017-01-21: 11:00:00 via INTRAVENOUS

## 2017-01-21 MED ORDER — FAMOTIDINE IN NACL 20-0.9 MG/50ML-% IV SOLN
20.0000 mg | Freq: Once | INTRAVENOUS | Status: AC
  Administered 2017-01-21: 20 mg via INTRAVENOUS

## 2017-01-21 MED ORDER — DIPHENHYDRAMINE HCL 50 MG/ML IJ SOLN
INTRAMUSCULAR | Status: AC
  Filled 2017-01-21: qty 1

## 2017-01-21 MED ORDER — HEPARIN SOD (PORK) LOCK FLUSH 100 UNIT/ML IV SOLN
500.0000 [IU] | Freq: Once | INTRAVENOUS | Status: AC | PRN
  Administered 2017-01-21: 500 [IU]
  Filled 2017-01-21: qty 5

## 2017-01-21 MED ORDER — PALONOSETRON HCL INJECTION 0.25 MG/5ML
INTRAVENOUS | Status: AC
  Filled 2017-01-21: qty 5

## 2017-01-21 MED ORDER — FOSAPREPITANT DIMEGLUMINE INJECTION 150 MG
Freq: Once | INTRAVENOUS | Status: AC
  Administered 2017-01-21: 11:00:00 via INTRAVENOUS
  Filled 2017-01-21: qty 5

## 2017-01-21 MED ORDER — DIPHENHYDRAMINE HCL 50 MG/ML IJ SOLN
50.0000 mg | Freq: Once | INTRAMUSCULAR | Status: AC
  Administered 2017-01-21: 50 mg via INTRAVENOUS

## 2017-01-21 MED ORDER — FAMOTIDINE IN NACL 20-0.9 MG/50ML-% IV SOLN
INTRAVENOUS | Status: AC
  Filled 2017-01-21: qty 50

## 2017-01-21 MED ORDER — PACLITAXEL CHEMO INJECTION 300 MG/50ML
175.0000 mg/m2 | Freq: Once | INTRAVENOUS | Status: AC
  Administered 2017-01-21: 318 mg via INTRAVENOUS
  Filled 2017-01-21: qty 53

## 2017-01-21 NOTE — Assessment & Plan Note (Signed)
She has subtle changes on her imaging study indicated of possible persistent disease I plan to repeat imaging study again after 3 cycles of treatment I will follow with tumor marker monitoring

## 2017-01-21 NOTE — Assessment & Plan Note (Signed)
We have extensive discussion about the goals of care The patient will be monitored closely and given high risk situation, I recommend maintenance treatment after completion of chemotherapy

## 2017-01-21 NOTE — Patient Instructions (Signed)
[facility] Discharge Instructions for Patients Receiving Chemotherapy  Today you received the following chemotherapy agents:  Taxol (paclitaxel), Carboplatin (paraplatin)  To help prevent nausea and vomiting after your treatment, we encourage you to take your nausea medication as prescribed.   If you develop nausea and vomiting that is not controlled by your nausea medication, call the clinic.   BELOW ARE SYMPTOMS THAT SHOULD BE REPORTED IMMEDIATELY:  *FEVER GREATER THAN 100.5 F  *CHILLS WITH OR WITHOUT FEVER  NAUSEA AND VOMITING THAT IS NOT CONTROLLED WITH YOUR NAUSEA MEDICATION  *UNUSUAL SHORTNESS OF BREATH  *UNUSUAL BRUISING OR BLEEDING  TENDERNESS IN MOUTH AND THROAT WITH OR WITHOUT PRESENCE OF ULCERS  *URINARY PROBLEMS  *BOWEL PROBLEMS  UNUSUAL RASH Items with * indicate a potential emergency and should be followed up as soon as possible.  Feel free to call the clinic should you have any questions or concerns. The clinic phone number is [phone].  Please show the [place] at check-in to the Emergency Department and triage nurse.     Carboplatin injection What is this medicine? CARBOPLATIN (KAR boe pla tin) is a chemotherapy drug. It targets fast dividing cells, like cancer cells, and causes these cells to die. This medicine is used to treat ovarian cancer and many other cancers. This medicine may be used for other purposes; ask your health care provider or pharmacist if you have questions. COMMON BRAND NAME(S): Paraplatin What should I tell my health care provider before I take this medicine? They need to know if you have any of these conditions: -blood disorders -hearing problems -kidney disease -recent or ongoing radiation therapy -an unusual or allergic reaction to carboplatin, cisplatin, other chemotherapy, other medicines, foods, dyes, or preservatives -pregnant or trying to get pregnant -breast-feeding How should I use this  medicine? This drug is usually given as an infusion into a vein. It is administered in a hospital or clinic by a specially trained health care professional. Talk to your pediatrician regarding the use of this medicine in children. Special care may be needed. Overdosage: If you think you have taken too much of this medicine contact a poison control center or emergency room at once. NOTE: This medicine is only for you. Do not share this medicine with others. What if I miss a dose? It is important not to miss a dose. Call your doctor or health care professional if you are unable to keep an appointment. What may interact with this medicine? -medicines for seizures -medicines to increase blood counts like filgrastim, pegfilgrastim, sargramostim -some antibiotics like amikacin, gentamicin, neomycin, streptomycin, tobramycin -vaccines Talk to your doctor or health care professional before taking any of these medicines: -acetaminophen -aspirin -ibuprofen -ketoprofen -naproxen This list may not describe all possible interactions. Give your health care provider a list of all the medicines, herbs, non-prescription drugs, or dietary supplements you use. Also tell them if you smoke, drink alcohol, or use illegal drugs. Some items may interact with your medicine. What should I watch for while using this medicine? Your condition will be monitored carefully while you are receiving this medicine. You will need important blood work done while you are taking this medicine. This drug may make you feel generally unwell. This is not uncommon, as chemotherapy can affect healthy cells as well as cancer cells. Report any side effects. Continue your course of treatment even though you feel ill unless your doctor tells you to stop. In some cases, you may be given additional medicines  to help with side effects. Follow all directions for their use. Call your doctor or health care professional for advice if you get a  fever, chills or sore throat, or other symptoms of a cold or flu. Do not treat yourself. This drug decreases your body's ability to fight infections. Try to avoid being around people who are sick. This medicine may increase your risk to bruise or bleed. Call your doctor or health care professional if you notice any unusual bleeding. Be careful brushing and flossing your teeth or using a toothpick because you may get an infection or bleed more easily. If you have any dental work done, tell your dentist you are receiving this medicine. Avoid taking products that contain aspirin, acetaminophen, ibuprofen, naproxen, or ketoprofen unless instructed by your doctor. These medicines may hide a fever. Do not become pregnant while taking this medicine. Women should inform their doctor if they wish to become pregnant or think they might be pregnant. There is a potential for serious side effects to an unborn child. Talk to your health care professional or pharmacist for more information. Do not breast-feed an infant while taking this medicine. What side effects may I notice from receiving this medicine? Side effects that you should report to your doctor or health care professional as soon as possible: -allergic reactions like skin rash, itching or hives, swelling of the face, lips, or tongue -signs of infection - fever or chills, cough, sore throat, pain or difficulty passing urine -signs of decreased platelets or bleeding - bruising, pinpoint red spots on the skin, black, tarry stools, nosebleeds -signs of decreased red blood cells - unusually weak or tired, fainting spells, lightheadedness -breathing problems -changes in hearing -changes in vision -chest pain -high blood pressure -low blood counts - This drug may decrease the number of white blood cells, red blood cells and platelets. You may be at increased risk for infections and bleeding. -nausea and vomiting -pain, swelling, redness or irritation at the  injection site -pain, tingling, numbness in the hands or feet -problems with balance, talking, walking -trouble passing urine or change in the amount of urine Side effects that usually do not require medical attention (report to your doctor or health care professional if they continue or are bothersome): -hair loss -loss of appetite -metallic taste in the mouth or changes in taste This list may not describe all possible side effects. Call your doctor for medical advice about side effects. You may report side effects to FDA at 1-800-FDA-1088. Where should I keep my medicine? This drug is given in a hospital or clinic and will not be stored at home. NOTE: This sheet is a summary. It may not cover all possible information. If you have questions about this medicine, talk to your doctor, pharmacist, or health care provider.  2018 Elsevier/Gold Standard (2007-07-14 14:38:05)    Paclitaxel injection What is this medicine? PACLITAXEL (PAK li TAX el) is a chemotherapy drug. It targets fast dividing cells, like cancer cells, and causes these cells to die. This medicine is used to treat ovarian cancer, breast cancer, and other cancers. This medicine may be used for other purposes; ask your health care provider or pharmacist if you have questions. COMMON BRAND NAME(S): Onxol, Taxol What should I tell my health care provider before I take this medicine? They need to know if you have any of these conditions: -blood disorders -irregular heartbeat -infection (especially a virus infection such as chickenpox, cold sores, or herpes) -liver disease -previous or ongoing  radiation therapy -an unusual or allergic reaction to paclitaxel, alcohol, polyoxyethylated castor oil, other chemotherapy agents, other medicines, foods, dyes, or preservatives -pregnant or trying to get pregnant -breast-feeding How should I use this medicine? This drug is given as an infusion into a vein. It is administered in a  hospital or clinic by a specially trained health care professional. Talk to your pediatrician regarding the use of this medicine in children. Special care may be needed. Overdosage: If you think you have taken too much of this medicine contact a poison control center or emergency room at once. NOTE: This medicine is only for you. Do not share this medicine with others. What if I miss a dose? It is important not to miss your dose. Call your doctor or health care professional if you are unable to keep an appointment. What may interact with this medicine? Do not take this medicine with any of the following medications: -disulfiram -metronidazole This medicine may also interact with the following medications: -cyclosporine -diazepam -ketoconazole -medicines to increase blood counts like filgrastim, pegfilgrastim, sargramostim -other chemotherapy drugs like cisplatin, doxorubicin, epirubicin, etoposide, teniposide, vincristine -quinidine -testosterone -vaccines -verapamil Talk to your doctor or health care professional before taking any of these medicines: -acetaminophen -aspirin -ibuprofen -ketoprofen -naproxen This list may not describe all possible interactions. Give your health care provider a list of all the medicines, herbs, non-prescription drugs, or dietary supplements you use. Also tell them if you smoke, drink alcohol, or use illegal drugs. Some items may interact with your medicine. What should I watch for while using this medicine? Your condition will be monitored carefully while you are receiving this medicine. You will need important blood work done while you are taking this medicine. This medicine can cause serious allergic reactions. To reduce your risk you will need to take other medicine(s) before treatment with this medicine. If you experience allergic reactions like skin rash, itching or hives, swelling of the face, lips, or tongue, tell your doctor or health care  professional right away. In some cases, you may be given additional medicines to help with side effects. Follow all directions for their use. This drug may make you feel generally unwell. This is not uncommon, as chemotherapy can affect healthy cells as well as cancer cells. Report any side effects. Continue your course of treatment even though you feel ill unless your doctor tells you to stop. Call your doctor or health care professional for advice if you get a fever, chills or sore throat, or other symptoms of a cold or flu. Do not treat yourself. This drug decreases your body's ability to fight infections. Try to avoid being around people who are sick. This medicine may increase your risk to bruise or bleed. Call your doctor or health care professional if you notice any unusual bleeding. Be careful brushing and flossing your teeth or using a toothpick because you may get an infection or bleed more easily. If you have any dental work done, tell your dentist you are receiving this medicine. Avoid taking products that contain aspirin, acetaminophen, ibuprofen, naproxen, or ketoprofen unless instructed by your doctor. These medicines may hide a fever. Do not become pregnant while taking this medicine. Women should inform their doctor if they wish to become pregnant or think they might be pregnant. There is a potential for serious side effects to an unborn child. Talk to your health care professional or pharmacist for more information. Do not breast-feed an infant while taking this medicine. Men are advised  not to father a child while receiving this medicine. This product may contain alcohol. Ask your pharmacist or healthcare provider if this medicine contains alcohol. Be sure to tell all healthcare providers you are taking this medicine. Certain medicines, like metronidazole and disulfiram, can cause an unpleasant reaction when taken with alcohol. The reaction includes flushing, headache, nausea, vomiting,  sweating, and increased thirst. The reaction can last from 30 minutes to several hours. What side effects may I notice from receiving this medicine? Side effects that you should report to your doctor or health care professional as soon as possible: -allergic reactions like skin rash, itching or hives, swelling of the face, lips, or tongue -low blood counts - This drug may decrease the number of white blood cells, red blood cells and platelets. You may be at increased risk for infections and bleeding. -signs of infection - fever or chills, cough, sore throat, pain or difficulty passing urine -signs of decreased platelets or bleeding - bruising, pinpoint red spots on the skin, black, tarry stools, nosebleeds -signs of decreased red blood cells - unusually weak or tired, fainting spells, lightheadedness -breathing problems -chest pain -high or low blood pressure -mouth sores -nausea and vomiting -pain, swelling, redness or irritation at the injection site -pain, tingling, numbness in the hands or feet -slow or irregular heartbeat -swelling of the ankle, feet, hands Side effects that usually do not require medical attention (report to your doctor or health care professional if they continue or are bothersome): -bone pain -complete hair loss including hair on your head, underarms, pubic hair, eyebrows, and eyelashes -changes in the color of fingernails -diarrhea -loosening of the fingernails -loss of appetite -muscle or joint pain -red flush to skin -sweating This list may not describe all possible side effects. Call your doctor for medical advice about side effects. You may report side effects to FDA at 1-800-FDA-1088. Where should I keep my medicine? This drug is given in a hospital or clinic and will not be stored at home. NOTE: This sheet is a summary. It may not cover all possible information. If you have questions about this medicine, talk to your doctor, pharmacist, or health care  provider.  2018 Elsevier/Gold Standard (2015-02-07 19:58:00)    Fosaprepitant injection What is this medicine? FOSAPREPITANT (fos ap RE pi tant) is used together with other medicines to prevent nausea and vomiting caused by cancer treatment (chemotherapy). This medicine may be used for other purposes; ask your health care provider or pharmacist if you have questions. COMMON BRAND NAME(S): Emend What should I tell my health care provider before I take this medicine? They need to know if you have any of these conditions: -liver disease -an unusual or allergic reaction to fosaprepitant, aprepitant, medicines, foods, dyes, or preservatives -pregnant or trying to get pregnant -breast-feeding How should I use this medicine? This medicine is for injection into a vein. It is given by a health care professional in a hospital or clinic setting. Talk to your pediatrician regarding the use of this medicine in children. Special care may be needed. Overdosage: If you think you have taken too much of this medicine contact a poison control center or emergency room at once. NOTE: This medicine is only for you. Do not share this medicine with others. What if I miss a dose? This does not apply. What may interact with this medicine? Do not take this medicine with any of these medicines: -cisapride -flibanserin -lomitapide -pimozide This medicine may also interact with the following medications: -  diltiazem -female hormones, like estrogens or progestins and birth control pills -medicines for fungal infections like ketoconazole and itraconazole -medicines for HIV -medicines for seizures or to control epilepsy like carbamazepine or phenytoin -medicines used for sleep or anxiety disorders like alprazolam, diazepam, or midazolam -nefazodone -paroxetine -ranolazine -rifampin -some chemotherapy medications like etoposide, ifosfamide, vinblastine, vincristine -some antibiotics like clarithromycin,  erythromycin, troleandomycin -steroid medicines like dexamethasone or methylprednisolone -tolbutamide -warfarin This list may not describe all possible interactions. Give your health care provider a list of all the medicines, herbs, non-prescription drugs, or dietary supplements you use. Also tell them if you smoke, drink alcohol, or use illegal drugs. Some items may interact with your medicine. What should I watch for while using this medicine? Do not take this medicine if you already have nausea and vomiting. Ask your health care provider what to do if you already have nausea. Birth control pills and other methods of hormonal contraception (for example, IUD or patch) may not work properly while you are taking this medicine. Use an extra method of birth control during treatment and for 1 month after your last dose of fosaprepitant. This medicine should not be used continuously for a long time. Visit your doctor or health care professional for regular check-ups. This medicine may change your liver function blood test results. What side effects may I notice from receiving this medicine? Side effects that you should report to your doctor or health care professional as soon as possible: -allergic reactions like skin rash, itching or hives, swelling of the face, lips, or tongue -breathing problems -changes in heart rhythm -high or low blood pressure -pain, redness, or irritation at site where injected -rectal bleeding -serious dizziness or disorientation, confusion -sharp or severe stomach pain -sharp pain in your leg Side effects that usually do not require medical attention (report to your doctor or health care professional if they continue or are bothersome): -constipation or diarrhea -hair loss -headache -hiccups -loss of appetite -nausea -upset stomach -tiredness This list may not describe all possible side effects. Call your doctor for medical advice about side effects. You may report  side effects to FDA at 1-800-FDA-1088. Where should I keep my medicine? This drug is given in a hospital or clinic and will not be stored at home. NOTE: This sheet is a summary. It may not cover all possible information. If you have questions about this medicine, talk to your doctor, pharmacist, or health care provider.  2018 Elsevier/Gold Standard (2014-05-25 10:45:34)

## 2017-01-21 NOTE — Assessment & Plan Note (Signed)
We have review imaging study extensively There are subtle changes in her CT scan, could indicate persistent disease She has a mildly elevated tumor marker I recommend we proceed with 3 cycles of chemotherapy with carboplatin and Taxol with plan to repeat imaging study after that If CT scan show excellent response to treatment, I will consider addition of Avastin We reviewed the NCCN guidelines We discussed the role of chemotherapy. The intent is of curative intent.  We discussed some of the risks, benefits, side-effects of carboplatin & Taxol. Treatment is intravenous, every 3 weeks x 6 cycles  Some of the short term side-effects included, though not limited to, including weight loss, life threatening infections, risk of allergic reactions, need for transfusions of blood products, nausea, vomiting, change in bowel habits, loss of hair, admission to hospital for various reasons, and risks of death.   Long term side-effects are also discussed including risks of infertility, permanent damage to nerve function, hearing loss, chronic fatigue, kidney damage with possibility needing hemodialysis, and rare secondary malignancy including bone marrow disorders.  The patient is aware that the response rates discussed earlier is not guaranteed.  After a long discussion, patient made an informed decision to proceed with the prescribed plan of care.   Patient education material was dispensed. We discussed premedication with dexamethasone before chemotherapy. Given her young age, I will hold off G-CSF for cycle 1

## 2017-01-21 NOTE — Progress Notes (Signed)
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 She denies vaginal bleeding or rectal bleeding.       08/12/2016 Imaging    Abdomen X-ray in the ER: Moderate colonic stool burden without evidence of enteric obstruction      11/13/2016 Imaging    TVUS was performed on 11/13/16 which showed a large hypoechoic lobular mass seen posterior to the uterus measuring 18.2x16.1x11.8cm, blood flow was seen along the periphery of the mass. It was considered to be likely to be a fibroid vs pelvic mass vs ovarian mass. Neither ovary was removed. Moderate amount of free fluid in the pelvis. THe uterus measures 4x10x4cm. The endometrium is 38mm.       12/05/2016 Imaging    MR pelvis 1. Mild limitations as detailed above. 2. Heterogeneous pelvic mass is favored to arise from the right ovary. Favor a solid ovarian neoplasm such as fibroma/Brenner's tumor. Given lesion size and heterogeneous T2 signal out, complicating torsion cannot be excluded. 3. Moderate abdominopelvic ascites, without specific evidence of peritoneal metastasis. Consider further evaluation with contrast-enhanced abdominopelvic CT.       12/26/2016 Pathology Results    1. Uterus, ovaries and fallopian tubes - ADENOCARCINOMA INVOLVING RIGHT FALLOPIAN TUBE, RIGHT AND LEFT OVARIES, UTERINE SEROSA AND PELVIC MASS. - SEE ONCOLOGY TABLE AND COMMENT. - UTERINE CERVIX, ENDOMETRIUM, MYOMETRIUM AND LEFT FALLOPIAN TUBE FREE OF TUMOR. 2. Omentum, resection for tumor - ADENOCARCINOMA. Microscopic  Comment 1. ONCOLOGY TABLE - FALLOPIAN TUBE. SEE COMMENT 1. Specimen, including laterality: Uterus, bilateral adnexa, pelvic mass and omentum. 2. Procedure: Hysterectomy with bilateral salpingo-oophorectomy, pelvic mass excision and omentectomy. 3. Lymph node sampling performed: No 4. Tumor site: See comment. 5. Tumor location in fallopian tube: Right fallopian tube fimbria 6. Specimen integrity (intact/ruptured/disrupted): Intact 7. Tumor size (cm): 18 cm, see comment. 8. Histologic type: Adenocarcinoma 9. Grade: High grade 10. Microscopic tumor extension: Tumor involves right fallopian tube, right and left ovaries, uterine serosa, pelvic mass and omentum. 11. Margins: See comment. 12. Lymph-Vascular invasion: Present 13. Lymph nodes: # examined: 0; # positive: N/A 14. TNM: pT3c, pNX 15. FIGO Stage (based on pathologic findings, needs clinical correlation: III-C 16. Comments: There is an 18 cm pelvic mass which is a high grade adenocarcinoma and there is a 12.2 cm  segment of omentum which is extensively involved with adenocarcinoma. The tumor also involves the fimbria of the right fallopian tube and the parenchyma of the right and left ovaries as well as uterine serosa. The fimbria of the right fallopian has intraepithelial atypia consistent with a precursor lesion and therefore this is most consistent with primary fallopian tube adenocarcinoma. A primary peritoneal serous carcinoma is a less likely possibility.       12/26/2016 Pathology Results    PERITONEAL/ASCITIC FLUID(SPECIMEN 1 OF 1 COLLECTED 12/26/16): POORLY DIFFERENTIATED ADENOCARCINOMA      12/26/2016 Surgery    Procedure(s) Performed: Exploratory laparotomy with total abdominal hysterectomy, bilateral salpingo-oophorectomy, omentectomy radical tumor debulking for ovarian cancer .  Surgeon: Thereasa Solo, MD.   Operative Findings: 20cm left ovarian mass densely adherent to the posterior uterus,  right tube and ovary, cervix,  sigmoid colon. 10cm omental cake. 4L ascites. 9mm size tumor nodules on the serosa of the terminal ileum and proximal sigmoid colon. 29mm nodules on right diaphragm.    This represented an optimal cytoreduction (R1) with 61mm nodules on intestine and diaphragm representing gross visible disease      01/16/2017 Procedure    Placement of single lumen port a cath via right internal jugular vein. The catheter tip lies at the cavo-atrial junction. A power injectable port a cath was placed and is ready for immediate use.      01/17/2017 Imaging    1. 3.5 x 7.2 x 4.6 cm fluid collection along the vaginal cuff, posterior the bladder and extending into the right adnexal space. This lesion demonstrates rim enhancement. Imaging features could be related to a loculated postoperative seroma or hematoma. Superinfection cannot be excluded by CT. Given debulking surgery was 3 weeks ago, this entire structure is un likely to represent neoplasm, but peritoneal involvement could have this appearance. 2. Small fluid collection in the left para colic gutter without rim enhancement. 3. Irregular/nodular appearance of the peritoneal M in the anatomic pelvis with areas of subtle nodularity between the stomach and the spleen. Close attention in these regions on follow-up recommended as metastatic disease is a concern. 4. Mildly enlarged hepatoduodenal ligament lymph node. Attention on follow-up recommended.      01/20/2017 Tumor Marker    Patient's tumor was tested for the following markers: CA-125 Results of the tumor marker test revealed 46.6       INTERVAL HISTORY: Please see below for problem oriented charting. She returns with family member to review imaging study results She tolerated recent port placement well She denies abdominal pain Her appetite is stable, no recent weight loss  REVIEW OF SYSTEMS:   Constitutional: Denies fevers, chills or abnormal weight loss Eyes: Denies blurriness of vision Ears,  nose, mouth, throat, and face: Denies mucositis or sore throat Respiratory: Denies cough, dyspnea or wheezes Cardiovascular: Denies palpitation, chest discomfort or lower extremity swelling Gastrointestinal:  Denies nausea, heartburn or change in bowel habits Skin: Denies abnormal skin rashes Lymphatics: Denies new lymphadenopathy or easy bruising Neurological:Denies numbness, tingling or new weaknesses Behavioral/Psych: Mood is stable, no new changes  All other systems were reviewed with the patient and are negative.  I have reviewed the past medical history, past surgical history, social history and family history with the patient and they are unchanged from previous note.  ALLERGIES:  is allergic to dilaudid [hydromorphone hcl] and sudafed [pseudoephedrine hcl].  MEDICATIONS:  Current Outpatient Prescriptions  Medication Sig Dispense Refill  . dexamethasone (DECADRON) 4 MG tablet 5 tabs the night before chemo and 5 tabs the morning of chemo, every 3 weeks x 6 60 tablet 1  . lidocaine-prilocaine (EMLA) cream Apply to affected area once 30 g 3  . losartan (COZAAR) 25 MG tablet Take 25 mg by mouth daily.     . naproxen sodium (ANAPROX) 220 MG tablet Take 220-440 mg by mouth 2 (two) times daily as needed (pain).     . ondansetron (ZOFRAN) 8 MG tablet Take 1 tablet (8 mg total) by mouth every 8 (eight) hours as needed for refractory nausea / vomiting. Start on day 3 after chemo. 30 tablet 1  . prochlorperazine (COMPAZINE) 10 MG tablet Take 1 tablet (10 mg total) by mouth every 6 (six) hours as needed (Nausea or vomiting). 30 tablet 1  . traMADol (ULTRAM) 50 MG tablet Take  1 tablet (50 mg total) by mouth every 6 (six) hours as needed for moderate pain. 30 tablet 0   No current facility-administered medications for this visit.    Facility-Administered Medications Ordered in Other Visits  Medication Dose Route Frequency Provider Last Rate Last Dose  . sodium chloride flush (NS) 0.9 %  injection 10 mL  10 mL Intracatheter PRN Alvy Bimler, Kemiyah Tarazon, MD   10 mL at 01/21/17 1600    PHYSICAL EXAMINATION: ECOG PERFORMANCE STATUS: 0 - Asymptomatic  Vitals:   01/20/17 1357  BP: 135/64  Pulse: 75  Resp: 18  Temp: 98.2 F (36.8 C)  SpO2: 100%   Filed Weights   01/20/17 1357  Weight: 156 lb 9.6 oz (71 kg)    GENERAL:alert, no distress and comfortable SKIN: skin color, texture, turgor are normal, no rashes or significant lesions EYES: normal, Conjunctiva are pink and non-injected, sclera clear OROPHARYNX:no exudate, no erythema and lips, buccal mucosa, and tongue normal  NECK: supple, thyroid normal size, non-tender, without nodularity LYMPH:  no palpable lymphadenopathy in the cervical, axillary or inguinal LUNGS: clear to auscultation and percussion with normal breathing effort HEART: regular rate & rhythm and no murmurs and no lower extremity edema ABDOMEN:abdomen soft, non-tender and normal bowel sounds Musculoskeletal:no cyanosis of digits and no clubbing  NEURO: alert & oriented x 3 with fluent speech, no focal motor/sensory deficits  LABORATORY DATA:  I have reviewed the data as listed    Component Value Date/Time   NA 142 01/20/2017 1339   K 4.4 01/20/2017 1339   CL 100 (L) 12/27/2016 0503   CO2 27 01/20/2017 1339   GLUCOSE 96 01/20/2017 1339   BUN 7.7 01/20/2017 1339   CREATININE 1.0 01/20/2017 1339   CALCIUM 9.6 01/20/2017 1339   PROT 7.1 01/20/2017 1339   ALBUMIN 3.8 01/20/2017 1339   AST 17 01/20/2017 1339   ALT 11 01/20/2017 1339   ALKPHOS 64 01/20/2017 1339   BILITOT 0.45 01/20/2017 1339   GFRNONAA >60 12/27/2016 0503   GFRAA >60 12/27/2016 0503    No results found for: SPEP, UPEP  Lab Results  Component Value Date   WBC 8.8 01/20/2017   NEUTROABS 2.9 01/20/2017   HGB 12.1 01/20/2017   HCT 36.9 01/20/2017   MCV 90.1 01/20/2017   PLT 243 01/20/2017      Chemistry      Component Value Date/Time   NA 142 01/20/2017 1339   K 4.4 01/20/2017  1339   CL 100 (L) 12/27/2016 0503   CO2 27 01/20/2017 1339   BUN 7.7 01/20/2017 1339   CREATININE 1.0 01/20/2017 1339      Component Value Date/Time   CALCIUM 9.6 01/20/2017 1339   ALKPHOS 64 01/20/2017 1339   AST 17 01/20/2017 1339   ALT 11 01/20/2017 1339   BILITOT 0.45 01/20/2017 1339       RADIOGRAPHIC STUDIES: I have personally reviewed the radiological images as listed and agreed with the findings in the report. Ct Chest W Contrast  Result Date: 01/17/2017 CLINICAL DATA:  stage IIIC high grade serous fallopian tube cancer, s/p ex lap, TAH, BSO, omentectomy, radical debulking on 12/26/16 EXAM: CT CHEST, ABDOMEN, AND PELVIS WITH CONTRAST TECHNIQUE: Multidetector CT imaging of the chest, abdomen and pelvis was performed following the standard protocol during bolus administration of intravenous contrast. CONTRAST:  100 cc Isovue-300 COMPARISON:  None. FINDINGS: CT CHEST FINDINGS Cardiovascular: The heart size is normal. No pericardial effusion. No thoracic aortic aneurysm. Right-sided Port-A-Cath tip is at  the distal SVC near the junction with the RA. Mediastinum/Nodes: No mediastinal lymphadenopathy. There is no hilar lymphadenopathy. The esophagus has normal imaging features. There is no axillary lymphadenopathy. Lungs/Pleura: Lungs are clear without suspicious pulmonary nodule or mass. No focal airspace consolidation. No pulmonary edema or pleural effusion. Musculoskeletal: Bone windows reveal no worrisome lytic or sclerotic osseous lesions. Edema and gas bubbles around the port device are compatible with placement yesterday. CT ABDOMEN PELVIS FINDINGS Hepatobiliary: No focal abnormality within the liver parenchyma. There is no evidence for gallstones, gallbladder wall thickening, or pericholecystic fluid. No intrahepatic or extrahepatic biliary dilation. Pancreas: No focal mass lesion. No dilatation of the main duct. No intraparenchymal cyst. No peripancreatic edema. Spleen: No splenomegaly.  No focal mass lesion. Adrenals/Urinary Tract: No adrenal nodule or mass. 5 mm low-density lesion lower pole left kidney is too small to characterize but likely a cyst. Right kidney unremarkable. No evidence for hydroureter. The urinary bladder appears normal for the degree of distention. Stomach/Bowel: Tiny hiatal hernia. Stomach otherwise unremarkable. Duodenum is normally positioned as is the ligament of Treitz. No small bowel wall thickening. No small bowel dilatation. The terminal ileum is normal. Appendix not well seen but likely incorporated in some mesenteric fluid in the presacral region (images 92-99 of series 2). No gross colonic mass. No colonic wall thickening. No substantial diverticular change. Vascular/Lymphatic: There is abdominal aortic atherosclerosis without aneurysm. Portal vein and superior mesenteric vein are patent. Splenic vein is patent. 12 mm short axis hepatoduodenal ligament lymph node (image 54 series 2) is mildly enlarged. Subtle nodularity identified between the stomach and the spleen (image 52 series 2). No retroperitoneal lymphadenopathy. No pelvic sidewall lymphadenopathy Reproductive: Uterus surgically absent. Other: 3.5 x 7.2 x 4.6 cm crescent-shaped fluid collection is identified in central pelvis, posterior to the bladder and along the vaginal cuff. This collection shows rim enhancement but no evidence for intralesional gas. Irregularity is seen along the peritoneal item lining both sides of the pelvis. There is subtle peritoneal irregularity along the left paracolic gutter and at the left wall of the left iliac crest, a 4.3 x 3.0 x 2.0 cm fluid collection is identified in the left para colic gutter without rim enhancement. Patient is status post omentectomy with straining in the anterior abdominal wall fat presumably postoperative. Musculoskeletal: Bone windows reveal no worrisome lytic or sclerotic osseous lesions. Anterior midline abdominal scar compatible with history of  exploratory laparotomy. Bone windows reveal no worrisome lytic or sclerotic osseous lesions. IMPRESSION: 1. 3.5 x 7.2 x 4.6 cm fluid collection along the vaginal cuff, posterior the bladder and extending into the right adnexal space. This lesion demonstrates rim enhancement. Imaging features could be related to a loculated postoperative seroma or hematoma. Superinfection cannot be excluded by CT. Given debulking surgery was 3 weeks ago, this entire structure is un likely to represent neoplasm, but peritoneal involvement could have this appearance. 2. Small fluid collection in the left para colic gutter without rim enhancement. 3. Irregular/nodular appearance of the peritoneal M in the anatomic pelvis with areas of subtle nodularity between the stomach and the spleen. Close attention in these regions on follow-up recommended as metastatic disease is a concern. 4. Mildly enlarged hepatoduodenal ligament lymph node. Attention on follow-up recommended. Electronically Signed   By: Misty Stanley M.D.   On: 01/17/2017 15:13   Ct Abdomen Pelvis W Contrast  Result Date: 01/17/2017 CLINICAL DATA:  stage IIIC high grade serous fallopian tube cancer, s/p ex lap, TAH, BSO, omentectomy, radical debulking  on 12/26/16 EXAM: CT CHEST, ABDOMEN, AND PELVIS WITH CONTRAST TECHNIQUE: Multidetector CT imaging of the chest, abdomen and pelvis was performed following the standard protocol during bolus administration of intravenous contrast. CONTRAST:  100 cc Isovue-300 COMPARISON:  None. FINDINGS: CT CHEST FINDINGS Cardiovascular: The heart size is normal. No pericardial effusion. No thoracic aortic aneurysm. Right-sided Port-A-Cath tip is at the distal SVC near the junction with the RA. Mediastinum/Nodes: No mediastinal lymphadenopathy. There is no hilar lymphadenopathy. The esophagus has normal imaging features. There is no axillary lymphadenopathy. Lungs/Pleura: Lungs are clear without suspicious pulmonary nodule or mass. No focal  airspace consolidation. No pulmonary edema or pleural effusion. Musculoskeletal: Bone windows reveal no worrisome lytic or sclerotic osseous lesions. Edema and gas bubbles around the port device are compatible with placement yesterday. CT ABDOMEN PELVIS FINDINGS Hepatobiliary: No focal abnormality within the liver parenchyma. There is no evidence for gallstones, gallbladder wall thickening, or pericholecystic fluid. No intrahepatic or extrahepatic biliary dilation. Pancreas: No focal mass lesion. No dilatation of the main duct. No intraparenchymal cyst. No peripancreatic edema. Spleen: No splenomegaly. No focal mass lesion. Adrenals/Urinary Tract: No adrenal nodule or mass. 5 mm low-density lesion lower pole left kidney is too small to characterize but likely a cyst. Right kidney unremarkable. No evidence for hydroureter. The urinary bladder appears normal for the degree of distention. Stomach/Bowel: Tiny hiatal hernia. Stomach otherwise unremarkable. Duodenum is normally positioned as is the ligament of Treitz. No small bowel wall thickening. No small bowel dilatation. The terminal ileum is normal. Appendix not well seen but likely incorporated in some mesenteric fluid in the presacral region (images 92-99 of series 2). No gross colonic mass. No colonic wall thickening. No substantial diverticular change. Vascular/Lymphatic: There is abdominal aortic atherosclerosis without aneurysm. Portal vein and superior mesenteric vein are patent. Splenic vein is patent. 12 mm short axis hepatoduodenal ligament lymph node (image 54 series 2) is mildly enlarged. Subtle nodularity identified between the stomach and the spleen (image 52 series 2). No retroperitoneal lymphadenopathy. No pelvic sidewall lymphadenopathy Reproductive: Uterus surgically absent. Other: 3.5 x 7.2 x 4.6 cm crescent-shaped fluid collection is identified in central pelvis, posterior to the bladder and along the vaginal cuff. This collection shows rim  enhancement but no evidence for intralesional gas. Irregularity is seen along the peritoneal item lining both sides of the pelvis. There is subtle peritoneal irregularity along the left paracolic gutter and at the left wall of the left iliac crest, a 4.3 x 3.0 x 2.0 cm fluid collection is identified in the left para colic gutter without rim enhancement. Patient is status post omentectomy with straining in the anterior abdominal wall fat presumably postoperative. Musculoskeletal: Bone windows reveal no worrisome lytic or sclerotic osseous lesions. Anterior midline abdominal scar compatible with history of exploratory laparotomy. Bone windows reveal no worrisome lytic or sclerotic osseous lesions. IMPRESSION: 1. 3.5 x 7.2 x 4.6 cm fluid collection along the vaginal cuff, posterior the bladder and extending into the right adnexal space. This lesion demonstrates rim enhancement. Imaging features could be related to a loculated postoperative seroma or hematoma. Superinfection cannot be excluded by CT. Given debulking surgery was 3 weeks ago, this entire structure is un likely to represent neoplasm, but peritoneal involvement could have this appearance. 2. Small fluid collection in the left para colic gutter without rim enhancement. 3. Irregular/nodular appearance of the peritoneal M in the anatomic pelvis with areas of subtle nodularity between the stomach and the spleen. Close attention in these regions on follow-up  recommended as metastatic disease is a concern. 4. Mildly enlarged hepatoduodenal ligament lymph node. Attention on follow-up recommended. Electronically Signed   By: Misty Stanley M.D.   On: 01/17/2017 15:13   Ir US Guide Vasc Access Right  Result Date: 01/16/2017 CLINICAL DATA:  Fallopian tube carcinoma and need for porta cath to begin chemotherapy. EXAM: IMPLANTED PORT A CATH PLACEMENT WITH ULTRASOUND AND FLUOROSCOPIC GUIDANCE ANESTHESIA/SEDATION: 2.0 mg IV Versed; 75 mcg IV Fentanyl Total Moderate  Sedation Time:  33 minutes The patient's level of consciousness and physiologic status were continuously monitored during the procedure by Radiology nursing. Additional Medications: 2 g IV Ancef. As antibiotic prophylaxis, Ancef was ordered pre-procedure and administered intravenously within one hour of incision. FLUOROSCOPY TIME:  24 seconds.  3.1 mGy. PROCEDURE: The procedure, risks, benefits, and alternatives were explained to the patient. Questions regarding the procedure were encouraged and answered. The patient understands and consents to the procedure. A time-out was performed prior to initiating the procedure. Ultrasound was utilized to confirm patency of the right internal jugular vein. The right neck and chest were prepped with chlorhexidine in a sterile fashion, and a sterile drape was applied covering the operative field. Maximum barrier sterile technique with sterile gowns and gloves were used for the procedure. Local anesthesia was provided with 1% lidocaine. After creating a small venotomy incision, a 21 gauge needle was advanced into the right internal jugular vein under direct, real-time ultrasound guidance. Ultrasound image documentation was performed. After securing guidewire access, an 8 Fr dilator was placed. A J-wire was kinked to measure appropriate catheter length. A subcutaneous port pocket was then created along the upper chest wall utilizing sharp and blunt dissection. Portable cautery was utilized. The pocket was irrigated with sterile saline. A single lumen power injectable port was chosen for placement. The 8 Fr catheter was tunneled from the port pocket site to the venotomy incision. The port was placed in the pocket. External catheter was trimmed to appropriate length based on guidewire measurement. At the venotomy, an 8 Fr peel-away sheath was placed over a guidewire. The catheter was then placed through the sheath and the sheath removed. Final catheter positioning was confirmed and  documented with a fluoroscopic spot image. The port was accessed with a needle and aspirated and flushed with heparinized saline. The access needle was removed. The venotomy and port pocket incisions were closed with subcutaneous 3-0 Monocryl and subcuticular 4-0 Vicryl. Dermabond was applied to both incisions. COMPLICATIONS: COMPLICATIONS None FINDINGS: After catheter placement, the tip lies at the cavo-atrial junction. The catheter aspirates normally and is ready for immediate use. IMPRESSION: Placement of single lumen port a cath via right internal jugular vein. The catheter tip lies at the cavo-atrial junction. A power injectable port a cath was placed and is ready for immediate use. Electronically Signed   By: Aletta Edouard M.D.   On: 01/16/2017 11:09   Ir Fluoro Guide Port Insertion Right  Result Date: 01/16/2017 CLINICAL DATA:  Fallopian tube carcinoma and need for porta cath to begin chemotherapy. EXAM: IMPLANTED PORT A CATH PLACEMENT WITH ULTRASOUND AND FLUOROSCOPIC GUIDANCE ANESTHESIA/SEDATION: 2.0 mg IV Versed; 75 mcg IV Fentanyl Total Moderate Sedation Time:  33 minutes The patient's level of consciousness and physiologic status were continuously monitored during the procedure by Radiology nursing. Additional Medications: 2 g IV Ancef. As antibiotic prophylaxis, Ancef was ordered pre-procedure and administered intravenously within one hour of incision. FLUOROSCOPY TIME:  24 seconds.  3.1 mGy. PROCEDURE: The procedure, risks, benefits, and  alternatives were explained to the patient. Questions regarding the procedure were encouraged and answered. The patient understands and consents to the procedure. A time-out was performed prior to initiating the procedure. Ultrasound was utilized to confirm patency of the right internal jugular vein. The right neck and chest were prepped with chlorhexidine in a sterile fashion, and a sterile drape was applied covering the operative field. Maximum barrier sterile  technique with sterile gowns and gloves were used for the procedure. Local anesthesia was provided with 1% lidocaine. After creating a small venotomy incision, a 21 gauge needle was advanced into the right internal jugular vein under direct, real-time ultrasound guidance. Ultrasound image documentation was performed. After securing guidewire access, an 8 Fr dilator was placed. A J-wire was kinked to measure appropriate catheter length. A subcutaneous port pocket was then created along the upper chest wall utilizing sharp and blunt dissection. Portable cautery was utilized. The pocket was irrigated with sterile saline. A single lumen power injectable port was chosen for placement. The 8 Fr catheter was tunneled from the port pocket site to the venotomy incision. The port was placed in the pocket. External catheter was trimmed to appropriate length based on guidewire measurement. At the venotomy, an 8 Fr peel-away sheath was placed over a guidewire. The catheter was then placed through the sheath and the sheath removed. Final catheter positioning was confirmed and documented with a fluoroscopic spot image. The port was accessed with a needle and aspirated and flushed with heparinized saline. The access needle was removed. The venotomy and port pocket incisions were closed with subcutaneous 3-0 Monocryl and subcuticular 4-0 Vicryl. Dermabond was applied to both incisions. COMPLICATIONS: COMPLICATIONS None FINDINGS: After catheter placement, the tip lies at the cavo-atrial junction. The catheter aspirates normally and is ready for immediate use. IMPRESSION: Placement of single lumen port a cath via right internal jugular vein. The catheter tip lies at the cavo-atrial junction. A power injectable port a cath was placed and is ready for immediate use. Electronically Signed   By: Aletta Edouard M.D.   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 The intent is of curative intent.  We discussed some of the risks, benefits, side-effects of carboplatin & Taxol. Treatment is intravenous, every 3 weeks x 6 cycles  Some of the short term side-effects included, though not limited to, including weight loss, life threatening infections, risk of allergic reactions, need for transfusions of blood products, nausea, vomiting, change in bowel habits, loss of hair, admission to hospital for various reasons, and risks of death.   Long term side-effects are also discussed including risks of infertility, permanent damage to nerve function, hearing loss, chronic fatigue, kidney damage with possibility needing hemodialysis, and rare secondary malignancy including bone marrow disorders.  The patient is aware that the response rates discussed earlier is not guaranteed.  After a long discussion, patient made an informed decision to proceed with the prescribed plan of care.   Patient education material was dispensed. We discussed premedication with dexamethasone before chemotherapy. 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 CBC with Differential    Standing Status:   Standing    Number of Occurrences:   20    Standing Expiration Date:   01/21/2018  . Comprehensive metabolic panel    Standing Status:   Standing    Number of Occurrences:   20    Standing Expiration Date:   01/21/2018   All questions were answered. The patient knows to call the clinic with any problems, questions or concerns. No barriers to learning was detected. I spent 40 minutes counseling the patient face to face. The total time spent in the appointment was 70 minutes and more than 50% was on counseling and review of test results     Heath Lark, MD 01/21/2017 5:29 PM

## 2017-01-27 ENCOUNTER — Telehealth: Payer: Self-pay

## 2017-01-27 NOTE — Telephone Encounter (Signed)
Pt called with constipation. She does not feel bad, no nausea, no bloated. Last BM on Wed, 1 packet miralax Sunday morning and 1 colace on Sunday PM.   Instructed her to take miralax and senna/colace this AM and to repeat in afternoon if needed. If no BM by morning use mag citrate. If that does not work to call back.

## 2017-01-30 ENCOUNTER — Encounter (HOSPITAL_COMMUNITY): Payer: Self-pay

## 2017-01-31 ENCOUNTER — Telehealth: Payer: Self-pay | Admitting: Genetic Counselor

## 2017-01-31 ENCOUNTER — Telehealth: Payer: Self-pay | Admitting: *Deleted

## 2017-01-31 ENCOUNTER — Ambulatory Visit (HOSPITAL_BASED_OUTPATIENT_CLINIC_OR_DEPARTMENT_OTHER): Payer: 59

## 2017-01-31 ENCOUNTER — Ambulatory Visit: Payer: 59 | Attending: Gynecologic Oncology | Admitting: Gynecologic Oncology

## 2017-01-31 ENCOUNTER — Encounter: Payer: Self-pay | Admitting: Genetic Counselor

## 2017-01-31 ENCOUNTER — Other Ambulatory Visit: Payer: Self-pay | Admitting: Gynecologic Oncology

## 2017-01-31 ENCOUNTER — Other Ambulatory Visit: Payer: Self-pay

## 2017-01-31 VITALS — BP 161/79 | HR 92 | Temp 98.3°F | Resp 20 | Wt 153.5 lb

## 2017-01-31 DIAGNOSIS — Z825 Family history of asthma and other chronic lower respiratory diseases: Secondary | ICD-10-CM | POA: Diagnosis not present

## 2017-01-31 DIAGNOSIS — Z79891 Long term (current) use of opiate analgesic: Secondary | ICD-10-CM | POA: Insufficient documentation

## 2017-01-31 DIAGNOSIS — Z8249 Family history of ischemic heart disease and other diseases of the circulatory system: Secondary | ICD-10-CM | POA: Insufficient documentation

## 2017-01-31 DIAGNOSIS — Z820 Family history of epilepsy and other diseases of the nervous system: Secondary | ICD-10-CM | POA: Diagnosis not present

## 2017-01-31 DIAGNOSIS — Z888 Allergy status to other drugs, medicaments and biological substances status: Secondary | ICD-10-CM | POA: Diagnosis not present

## 2017-01-31 DIAGNOSIS — K449 Diaphragmatic hernia without obstruction or gangrene: Secondary | ICD-10-CM | POA: Diagnosis not present

## 2017-01-31 DIAGNOSIS — Z9889 Other specified postprocedural states: Secondary | ICD-10-CM | POA: Insufficient documentation

## 2017-01-31 DIAGNOSIS — L0390 Cellulitis, unspecified: Secondary | ICD-10-CM | POA: Diagnosis not present

## 2017-01-31 DIAGNOSIS — T8149XA Infection following a procedure, other surgical site, initial encounter: Secondary | ICD-10-CM

## 2017-01-31 DIAGNOSIS — C5701 Malignant neoplasm of right fallopian tube: Secondary | ICD-10-CM | POA: Diagnosis not present

## 2017-01-31 DIAGNOSIS — I10 Essential (primary) hypertension: Secondary | ICD-10-CM | POA: Diagnosis not present

## 2017-01-31 DIAGNOSIS — T8189XA Other complications of procedures, not elsewhere classified, initial encounter: Secondary | ICD-10-CM | POA: Insufficient documentation

## 2017-01-31 DIAGNOSIS — Z823 Family history of stroke: Secondary | ICD-10-CM | POA: Diagnosis not present

## 2017-01-31 DIAGNOSIS — C5700 Malignant neoplasm of unspecified fallopian tube: Secondary | ICD-10-CM | POA: Diagnosis not present

## 2017-01-31 DIAGNOSIS — Z79899 Other long term (current) drug therapy: Secondary | ICD-10-CM | POA: Diagnosis not present

## 2017-01-31 DIAGNOSIS — Z833 Family history of diabetes mellitus: Secondary | ICD-10-CM | POA: Insufficient documentation

## 2017-01-31 DIAGNOSIS — Z90710 Acquired absence of both cervix and uterus: Secondary | ICD-10-CM | POA: Diagnosis not present

## 2017-01-31 DIAGNOSIS — Z808 Family history of malignant neoplasm of other organs or systems: Secondary | ICD-10-CM | POA: Diagnosis not present

## 2017-01-31 DIAGNOSIS — K219 Gastro-esophageal reflux disease without esophagitis: Secondary | ICD-10-CM | POA: Insufficient documentation

## 2017-01-31 DIAGNOSIS — Z1379 Encounter for other screening for genetic and chromosomal anomalies: Secondary | ICD-10-CM | POA: Insufficient documentation

## 2017-01-31 LAB — COMPREHENSIVE METABOLIC PANEL
ALT: 23 U/L (ref 0–55)
AST: 22 U/L (ref 5–34)
Albumin: 3.9 g/dL (ref 3.5–5.0)
Alkaline Phosphatase: 64 U/L (ref 40–150)
Anion Gap: 8 mEq/L (ref 3–11)
BUN: 10.3 mg/dL (ref 7.0–26.0)
CALCIUM: 9.2 mg/dL (ref 8.4–10.4)
CHLORIDE: 104 meq/L (ref 98–109)
CO2: 26 meq/L (ref 22–29)
CREATININE: 0.8 mg/dL (ref 0.6–1.1)
EGFR: >60 mL/min/{1.73_m2} (ref >60)
GLUCOSE: 91 mg/dL (ref 70–140)
POTASSIUM: 3.7 meq/L (ref 3.5–5.1)
SODIUM: 139 meq/L (ref 136–145)
Total Bilirubin: 0.31 mg/dL (ref 0.20–1.20)
Total Protein: 6.9 g/dL (ref 6.4–8.3)

## 2017-01-31 LAB — CBC WITH DIFFERENTIAL/PLATELET
BASO%: 2.7 % — AB (ref 0.0–2.0)
Basophils Absolute: 0 10*3/uL (ref 0.0–0.1)
EOS%: 9.4 % — AB (ref 0.0–7.0)
Eosinophils Absolute: 0.1 10*3/uL (ref 0.0–0.5)
HEMATOCRIT: 34.6 % — AB (ref 34.8–46.6)
HGB: 11.4 g/dL — ABNORMAL LOW (ref 11.6–15.9)
LYMPH%: 66.5 % — ABNORMAL HIGH (ref 14.0–49.7)
MCH: 29.2 pg (ref 25.1–34.0)
MCHC: 32.9 g/dL (ref 31.5–36.0)
MCV: 88.9 fL (ref 79.5–101.0)
MONO#: 0.2 10*3/uL (ref 0.1–0.9)
MONO%: 14 % (ref 0.0–14.0)
NEUT#: 0.1 10*3/uL — CL (ref 1.5–6.5)
NEUT%: 7.4 % — AB (ref 38.4–76.8)
Platelets: 137 10*3/uL — ABNORMAL LOW (ref 145–400)
RBC: 3.89 10*6/uL (ref 3.70–5.45)
RDW: 13.6 % (ref 11.2–14.5)
WBC: 1.6 10*3/uL — AB (ref 3.9–10.3)
lymph#: 1 10*3/uL (ref 0.9–3.3)

## 2017-01-31 MED ORDER — AMOXICILLIN-POT CLAVULANATE 875-125 MG PO TABS: 1.0000 | ORAL_TABLET | Freq: Two times a day (BID) | ORAL | 0 refills | Status: DC

## 2017-01-31 MED ORDER — CEPHALEXIN 500 MG PO CAPS: 500.0000 mg | ORAL_CAPSULE | Freq: Four times a day (QID) | ORAL | 0 refills | Status: DC

## 2017-01-31 NOTE — Telephone Encounter (Signed)
LM on VM with good news.  Asked that she CB. 

## 2017-01-31 NOTE — Telephone Encounter (Signed)
FYI "I had surgery September 6th by Dr. Denman George and also a patient of Dr. Alvy Bimler.  At the bottom of my incision, I think I have an abscess.  My daughter-in-law looked at it last night.  When we push on it puss is coming out of it.  My next chemotherapy will be October 23rd so I do not want to have an infection.  I called Dr, Rossi's number, no answer, did not leave a message.  No, I do not have a fever."  Call transferred to GYN Oncology extension.  Call information to Med Oncology.  F/U appointment has been scheduled today at 2:00 pm with [name] NP, APP.  Next Med Onc F/U is 02-11-2017 before scheduled chemotherapy.

## 2017-01-31 NOTE — Telephone Encounter (Signed)
Revealed negative genetic testing.  Discussed that we do not know why she has ovarian cancer or why there is cancer in the family. It could be due to a different gene that we are not testing, or maybe our current technology may not be able to pick something up.  It will be important for her to keep in contact with genetics to keep up with whether additional testing may be needed.  HRD testing was negative.  

## 2017-01-31 NOTE — Patient Instructions (Signed)
Plan on taking Keflex 500 mg four times a day for seven days.  Please call for any worsening symptoms, increased redness, fever, or any new concerning symptoms.

## 2017-01-31 NOTE — Progress Notes (Signed)
Informed patient of recent CBC results.  Advised her to not take keflex but to take Augmentin twice daily for ten days per Dr. Alvy Bimler.  Advised to call on Monday with an update on her status.  Neutropenic precautions discussed.  Advised to call for any needs or concerns.

## 2017-02-03 ENCOUNTER — Ambulatory Visit: Payer: Self-pay | Admitting: Genetic Counselor

## 2017-02-03 ENCOUNTER — Telehealth: Payer: Self-pay | Admitting: *Deleted

## 2017-02-03 DIAGNOSIS — C5701 Malignant neoplasm of right fallopian tube: Secondary | ICD-10-CM

## 2017-02-03 DIAGNOSIS — Z1379 Encounter for other screening for genetic and chromosomal anomalies: Secondary | ICD-10-CM

## 2017-02-03 LAB — CA 125: Cancer Antigen (CA) 125: 22.4 U/mL (ref 0.0–38.1)

## 2017-02-03 NOTE — Telephone Encounter (Signed)
-----   Message from Heath Lark, MD sent at 02/03/2017  9:05 AM EDT ----- Regarding: check on her Can you call to check on her how she is doing? ----- Message ----- From: Interface, Lab In Three Zero One Sent: 01/31/2017   3:03 PM To: Heath Lark, MD

## 2017-02-03 NOTE — Telephone Encounter (Signed)
States site looks much better. Has been taking antibiotics. She is doing fine.

## 2017-02-03 NOTE — Progress Notes (Signed)
HPI: Holly [name] was previously seen in the [place] clinic due to a personal history of cancer and concerns regarding a hereditary predisposition to cancer. Please refer to our prior cancer genetics clinic note for more information regarding Holly [name] medical, social and family histories, and our assessment and recommendations, at the time. Holly [name] recent genetic test results were disclosed to her, as were recommendations warranted by these results. These results and recommendations are discussed in more detail below.  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 She denies vaginal bleeding or rectal bleeding.       08/12/2016 Imaging    Abdomen X-ray in the ER: Moderate colonic stool burden without evidence of enteric obstruction      11/13/2016 Imaging    TVUS was performed on 11/13/16 which showed a large hypoechoic lobular mass seen posterior to the uterus measuring 18.2x16.1x11.8cm, blood flow was seen along the periphery of the mass. It was considered to be likely to be a fibroid vs pelvic mass vs ovarian mass. Neither ovary was removed. Moderate amount of free fluid in the pelvis. THe uterus measures 4x10x4cm. The endometrium is 21m.       12/05/2016 Imaging    MR pelvis 1. Mild limitations as detailed above. 2. Heterogeneous pelvic mass is favored to arise from the right ovary. Favor a solid ovarian neoplasm such as fibroma/Brenner's tumor. Given lesion size and heterogeneous T2 signal out, complicating torsion cannot be excluded. 3. Moderate abdominopelvic ascites, without specific evidence of peritoneal metastasis. Consider further evaluation with contrast-enhanced abdominopelvic CT.       12/26/2016 Pathology Results    1. Uterus, ovaries and  fallopian tubes - ADENOCARCINOMA INVOLVING RIGHT FALLOPIAN TUBE, RIGHT AND LEFT OVARIES, UTERINE SEROSA AND PELVIC MASS. - SEE ONCOLOGY TABLE AND COMMENT. - UTERINE CERVIX, ENDOMETRIUM, MYOMETRIUM AND LEFT FALLOPIAN TUBE FREE OF TUMOR. 2. Omentum, resection for tumor - ADENOCARCINOMA. Microscopic Comment 1. ONCOLOGY TABLE - FALLOPIAN TUBE. SEE COMMENT 1. Specimen, including laterality: Uterus, bilateral adnexa, pelvic mass and omentum. 2. Procedure: Hysterectomy with bilateral salpingo-oophorectomy, pelvic mass excision and omentectomy. 3. Lymph node sampling performed: No 4. Tumor site: See comment. 5. Tumor location in fallopian tube: Right fallopian tube fimbria 6. Specimen integrity (intact/ruptured/disrupted): Intact 7. Tumor size (cm): 18 cm, see comment. 8. Histologic type: Adenocarcinoma 9. Grade: High grade 10. Microscopic tumor extension: Tumor involves right fallopian tube, right and left ovaries, uterine serosa, pelvic mass and omentum. 11. Margins: See comment. 12. Lymph-Vascular invasion: Present 13. Lymph nodes: # examined: 0; # positive: N/A 14. TNM: pT3c, pNX 15. FIGO Stage (based on pathologic findings, needs clinical correlation: III-C 16. Comments: There is an 18 cm pelvic mass which is a high grade adenocarcinoma and there is a 12.2 cm  segment of omentum which is extensively involved with adenocarcinoma. The tumor also involves the fimbria of the right fallopian tube and the parenchyma of the right and left ovaries as well as uterine serosa. The fimbria of the right fallopian has intraepithelial atypia consistent with a precursor lesion and therefore this is most consistent with primary fallopian tube adenocarcinoma. A primary peritoneal serous carcinoma is a less likely possibility.       12/26/2016 Pathology Results    PERITONEAL/ASCITIC FLUID(SPECIMEN 1 OF 1 COLLECTED  12/26/16): POORLY DIFFERENTIATED ADENOCARCINOMA      12/26/2016 Surgery    Procedure(s)  Performed: Exploratory laparotomy with total abdominal hysterectomy, bilateral salpingo-oophorectomy, omentectomy radical tumor debulking for ovarian cancer .  Surgeon: Thereasa Solo, MD.   Operative Findings: 20cm left ovarian mass densely adherent to the posterior uterus, right tube and ovary, cervix, sigmoid colon. 10cm omental cake. 4L ascites. 48m size tumor nodules on the serosa of the terminal ileum and proximal sigmoid colon. 167mnodules on right diaphragm.    This represented an optimal cytoreduction (R1) with 2m40modules on intestine and diaphragm representing gross visible disease      01/16/2017 Procedure    Placement of single lumen port a cath via right internal jugular vein. The catheter tip lies at the cavo-atrial junction. A power injectable port a cath was placed and is ready for immediate use.      01/17/2017 Imaging    1. 3.5 x 7.2 x 4.6 cm fluid collection along the vaginal cuff, posterior the bladder and extending into the right adnexal space. This lesion demonstrates rim enhancement. Imaging features could be related to a loculated postoperative seroma or hematoma. Superinfection cannot be excluded by CT. Given debulking surgery was 3 weeks ago, this entire structure is un likely to represent neoplasm, but peritoneal involvement could have this appearance. 2. Small fluid collection in the left para colic gutter without rim enhancement. 3. Irregular/nodular appearance of the peritoneal M in the anatomic pelvis with areas of subtle nodularity between the stomach and the spleen. Close attention in these regions on follow-up recommended as metastatic disease is a concern. 4. Mildly enlarged hepatoduodenal ligament lymph node. Attention on follow-up recommended.      01/20/2017 Tumor Marker    Patient's tumor was tested for the following markers: CA-125 Results of the tumor marker test revealed 46.6      01/28/2017 Genetic Testing    Negative genetic testing on the [facility].  The [facility] panel offered by [employer] sequencing and deletion/duplication testing of the following 28 genes: APC, ATM, BARD1, BMPR1A, BRCA1, BRCA2, BRIP1, CHD1, CDK4, CDKN2A, CHEK2, EPCAM (large rearrangement only), MLH1, MSH2, MSH6, MUTYH, NBN, PALB2, PMS2, PTEN, RAD51C, RAD51D, SMAD4, STK11, and TP53. Sequencing was performed for select regions of POLE and POLD1, and large rearrangement analysis was performed for select regions of GREM1. The report date is January 28, 2017.  HRD testing looking for genomic instability and BRCA mutations was negative.  The report date of this test is January 27, 2017.       01/31/2017 Tumor Marker    Patient's tumor was tested for the following markers: CA-125 Results of the tumor marker test revealed 22.4       FAMILY HISTORY:  We obtained a detailed, 4-generation family history.  Significant diagnoses are listed below: Family History  Problem Relation Age of Onset  . Diabetes Mother   . Diabetes Father   . Hypertension Father   . Stroke Father   . COPD Father   . Heart attack Sister 47 55 Dementia Maternal Aunt   . Bone cancer Paternal Uncle        deceased at 58 101 Dementia Maternal Grandmother   . Heart attack Paternal Grandmother   . Heart attack Paternal Grandfather   . Healthy Son   . Healthy Son     The patient has two biological sons and two adopted children who are all cancer free.  She has three  grandchildren who are healthy and cancer free.  She has one sister who died at 31 from a heart attack.  Both parents are deceased.    The patient's mother died from complications of dementia.  She had two brothers and three sisters.  Two sisters also died from complications of dementia and one sister is living. One brother is alive and one died from unknown causes.  The grandparents are deceased.  The grandmother died from complications of dementia.  The patient's father died of COPD.  He was a  smoker.  He had one brother who had bone cancer and died at 61.  Both grandparents died of heart attacks.  Holly [name] is unaware of previous family history of genetic testing for hereditary cancer risks. Patient's maternal ancestors are of [place] descent, and paternal ancestors are of [place] descent. There is no reported Ashkenazi Jewish ancestry. There is no known consanguinity.  GENETIC TEST RESULTS: Genetic testing reported out on January 31, 2017 through the [facility] cancer panel found no deleterious mutations.  The [facility] gene panel offered by [employer] includes sequencing and deletion/duplication testing of the following 28 genes: APC, ATM, BARD1, BMPR1A, BRCA1, BRCA2, BRIP1, CHD1, CDK4, CDKN2A, CHEK2, EPCAM (large rearrangement only), MLH1, MSH2, MSH6, MUTYH, NBN, PALB2, PMS2, PTEN, RAD51C, RAD51D, SMAD4, STK11, and TP53. Sequencing was performed for select regions of POLE and POLD1, and large rearrangement analysis was performed for select regions of GREM1. The test report has been scanned into EPIC and is located under the Molecular Pathology section of the Results Review tab.   HRD testing was performed on the ovarian tumor.  Testing consisted of BRCA1 and BRCA2 analysis and genomic instability.  HRD testing was negative.  We discussed with Holly [name] that since the current genetic testing is not perfect, it is possible there may be a gene mutation in one of these genes that current testing cannot detect, but that chance is small. We also discussed, that it is possible that another gene that has not yet been discovered, or that we have not yet tested, is responsible for the cancer diagnoses in the family, and it is, therefore, important to remain in touch with cancer genetics in the future so that we can continue to offer Holly [name] the most up to date genetic testing.        CANCER SCREENING RECOMMENDATIONS:  This result is reassuring and indicates that Holly [name] likely does  not have an increased risk for a future cancer due to a mutation in one of these genes. This normal test also suggests that Holly [name] cancer was most likely not due to an inherited predisposition associated with one of these genes.  Most cancers happen by chance and this negative test suggests that her cancer falls into this category.  We, therefore, recommended she continue to follow the cancer management and screening guidelines provided by her oncology and primary healthcare provider.   RECOMMENDATIONS FOR FAMILY MEMBERS: Women in this family might be at some increased risk of developing cancer, over the general population risk, simply due to the family history of cancer. We recommended women in this family have a yearly mammogram beginning at age 24, or 69 years younger than the earliest onset of cancer, an annual clinical breast exam, and perform monthly breast self-exams. Women in this family should also have a gynecological exam as recommended by their primary provider. All family members should have a colonoscopy by age 46.  FOLLOW-UP: Lastly, we discussed with Ms.  [name] that cancer genetics is a rapidly advancing field and it is possible that new genetic tests will be appropriate for her and/or her family members in the future. We encouraged her to remain in contact with cancer genetics on an annual basis so we can update her personal and family histories and let her know of advances in cancer genetics that may benefit this family.   Our contact number was provided. Holly [name] questions were answered to her satisfaction, and she knows she is welcome to call us at anytime with additional questions or concerns.   Roma Kayser, MS, [facility] Certified Genetic Counselor [email or web address]

## 2017-02-03 NOTE — Telephone Encounter (Signed)
Left message to call Dr Alvy Bimler' nurse

## 2017-02-05 ENCOUNTER — Encounter: Payer: Self-pay | Admitting: Gynecologic Oncology

## 2017-02-05 NOTE — Progress Notes (Signed)
Follow Up Note: Gyn-Onc  [name] 61 y.o. female  CC:  Chief Complaint  Patient presents with  . Drainage from Incision    follow up post-op    HPI:  Holly [name] is a 61 year old female initially seen at the consultation of Dr Simona Huh for a large pelvic mass.  She reported vague symptoms of fullness in the pelvis, urinary frequency and difficulty with defecation for three months.  She sought care at an Urgent Care in July 2018.  An ultrasound was performed on 11/13/16 resulting a large hypoechoic lobular mass seen posterior to the uterus measuring 18.2x16.1x11.8cm, blood flow was seen along the periphery of the mass. It was considered to be likely to be a fibroid vs pelvic mass vs ovarian mass. Neither ovary was removed. Moderate amount of free fluid in the pelvis. THe uterus measures 4x10x4cm. The endometrium is 51mm.  On 12/05/16, an MRI was performed which showed a 13.9x11.9x18.3cm T1 hypointense central pelvic mass favoring to arise from the right ovary. No peritoneal metastases were identified in the pelvis. They favor a solid ovarian neoplasm such as a brenner tumor or fibroma.  On 12/26/16, she underwent an exploratory laparotomy with total abdominal hysterectomy, bilateral salpingo-oophorectomy, omentectomy radical tumor debulking for ovarian cancer with Dr. Everitt Amber.  CT CAP for restaging on 01/17/17 resulted: IMPRESSION: 1. 3.5 x 7.2 x 4.6 cm fluid collection along the vaginal cuff, posterior the bladder and extending into the right adnexal space. This lesion demonstrates rim enhancement. Imaging features could be related to a loculated postoperative seroma or hematoma. Superinfection cannot be excluded by CT. Given debulking surgery was 3 weeks ago, this entire structure is un likely to represent neoplasm, but peritoneal involvement could have this appearance. 2. Small fluid collection in the left para colic gutter without rim enhancement. 3. Irregular/nodular appearance of the  peritoneal M in the anatomic pelvis with areas of subtle nodularity between the stomach and the spleen. Close attention in these regions on follow-up recommended as metastatic disease is a concern. 4. Mildly enlarged hepatoduodenal ligament lymph node. Attention on follow-up recommended.   She received her first cycle of chemotherapy with carboplatin and taxol on 01/21/17 and tolerated it well.    Interval History:  She presents today with her family member for evaluation of incisional drainage.  She states last pm she noticed firmness at the lower portion of her incision and with light palpation she was able to express thick drainage.  Her family member was concerned she had an abscess.  No erythema was reported at that time.  No fever or chills reported.  Bowels and bladder functioning.  She has been placing a bandage over the area.  No abdominal symptoms except for incisional drainage.      Review of Systems: Constitutional: Feels well. No fever or chills. Cardiovascular: No chest pain, shortness of breath, or edema.  Pulmonary: No cough or wheeze.  Gastrointestinal: No nausea, vomiting, or diarrhea. No bright red blood per rectum or change in bowel movement.  Genitourinary: No frequency, urgency, or dysuria. No vaginal bleeding or discharge.  Musculoskeletal: No myalgia or joint pain Neurologic: No weakness, numbness, or change in gait.  Psychology: No depression, anxiety, or insomnia  Current Meds:  Outpatient Encounter Prescriptions as of 01/31/2017  Medication Sig  . dexamethasone (DECADRON) 4 MG tablet 5 tabs the night before chemo and 5 tabs the morning of chemo, every 3 weeks x 6  . lidocaine-prilocaine (EMLA) cream Apply to affected area  once  . losartan (COZAAR) 25 MG tablet Take 25 mg by mouth daily.   . naproxen sodium (ANAPROX) 220 MG tablet Take 220-440 mg by mouth 2 (two) times daily as needed (pain).   . ondansetron (ZOFRAN) 8 MG tablet Take 1 tablet (8 mg total) by mouth  every 8 (eight) hours as needed for refractory nausea / vomiting. Start on day 3 after chemo.  . prochlorperazine (COMPAZINE) 10 MG tablet Take 1 tablet (10 mg total) by mouth every 6 (six) hours as needed (Nausea or vomiting).  . traMADol (ULTRAM) 50 MG tablet Take 1 tablet (50 mg total) by mouth every 6 (six) hours as needed for moderate pain.  . [DISCONTINUED] cephALEXin (KEFLEX) 500 MG capsule Take 1 capsule (500 mg total) by mouth 4 (four) times daily.   No facility-administered encounter medications on file as of 01/31/2017.     Allergy:  Allergies  Allergen Reactions  . Dilaudid [Hydromorphone Hcl] Nausea And Vomiting  . Sudafed [Pseudoephedrine Hcl] Other (See Comments)    Nervous/jittery    Social Hx:   Social History   Social History  . Marital status: Married    Spouse name: [name]  . Number of children: 4  . Years of education: N/A   Occupational History  . Computer    Social History Main Topics  . Smoking status: Never Smoker  . Smokeless tobacco: Never Used  . Alcohol use No  . Drug use: No  . Sexual activity: Not on file   Other Topics Concern  . Not on file   Social History Narrative  . No narrative on file    Past Surgical Hx:  Past Surgical History:  Procedure Laterality Date  . DEBULKING N/A 12/26/2016   Procedure: RADICAL TUMOR DEBULKING; OMENTECTOMY;  Surgeon: Everitt Amber, MD;  Location: [facility];  Service: Gynecology;  Laterality: N/A;  . ganglion cyst removed    . Hital Hernia    . IR FLUORO GUIDE PORT INSERTION RIGHT  01/16/2017  . IR US GUIDE VASC ACCESS RIGHT  01/16/2017  . LAPAROTOMY N/A 12/26/2016   Procedure: EXPLORATORY LAPAROTOMY;  Surgeon: Everitt Amber, MD;  Location: [facility];  Service: Gynecology;  Laterality: N/A;  . left morton's neuroma surgery     . Nissan Fundoplication      Past Medical Hx:  Past Medical History:  Diagnosis Date  . Complication of anesthesia    slow to wake up sometimes   . GERD (gastroesophageal reflux disease)    . History of hiatal hernia   . Hypertension   . Ovarian cancer ([place])   . PONV (postoperative nausea and vomiting)     Family Hx:  Family History  Problem Relation Age of Onset  . Diabetes Mother   . Diabetes Father   . Hypertension Father   . Stroke Father   . COPD Father   . Heart attack Sister 44  . Dementia Maternal Aunt   . Bone cancer Paternal Uncle        deceased at 29  . Dementia Maternal Grandmother   . Heart attack Paternal Grandmother   . Heart attack Paternal Grandfather   . Healthy Son   . Healthy Son     Vitals:  Blood pressure (!) 161/79, pulse 92, temperature 98.3 F (36.8 C), temperature source Oral, resp. rate 20, weight 153 lb 8 oz (69.6 kg), SpO2 100 %.  Physical Exam:  General: Well developed, well nourished female in no acute distress. Alert and oriented x 3.  Cardiovascular: Regular rate and rhythm. S1 and S2 normal.  Lungs: Clear to auscultation bilaterally. No wheezes/crackles/rhonchi noted.  Skin: No rashes or lesions present. Back: No CVA tenderness.  Abdomen: Abdomen soft, non-tender and obese. Active bowel sounds in all quadrants. No evidence of a fluid wave or abdominal masses.  Midline incision well healed except for lower portion of incision.  Erythema noted around the lower portion of the incision with increased warmth.  59mm opening in the incision with purulent drainage expressed with light palpation.  Incision assessed by Dr. Denman George as well.  Erythema marked.  Extremities: No bilateral cyanosis, edema, or clubbing.   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  She is doing well post-operatively and after first cycle of chemotherapy except for new onset of incisional drainage.  Due to erythema, increased warmth, and thick discharge from incision, plan to begin antibiotic therapy.  Advised to begin taking Augmentin twice daily and to  call our office or Dr. Calton Dach office on Monday with an update.  Reportable signs and symptoms reviewed and advised to call the after hours number over the weekend if needed.    Update:  Called patient with CBC results.  Discussed ANC and neutropenic precautions.  Dr. Alvy Bimler and Dr. Denman George aware of CBC findings.  She will call for worsening or persistent symptoms or for new concerns.   CROSS, MELISSA DEAL, NP 02/05/2017, 3:24 PM

## 2017-02-06 MED FILL — DEXAMETHASONE 4 MG TABLET: 4 | 21 days supply | Qty: 10 | Fill #1

## 2017-02-11 ENCOUNTER — Telehealth: Payer: Self-pay | Admitting: Hematology and Oncology

## 2017-02-11 ENCOUNTER — Encounter: Payer: Self-pay | Admitting: Hematology and Oncology

## 2017-02-11 ENCOUNTER — Telehealth: Payer: Self-pay

## 2017-02-11 ENCOUNTER — Ambulatory Visit: Payer: 59

## 2017-02-11 ENCOUNTER — Ambulatory Visit (HOSPITAL_BASED_OUTPATIENT_CLINIC_OR_DEPARTMENT_OTHER): Payer: 59 | Admitting: Hematology and Oncology

## 2017-02-11 ENCOUNTER — Ambulatory Visit (HOSPITAL_BASED_OUTPATIENT_CLINIC_OR_DEPARTMENT_OTHER): Payer: 59

## 2017-02-11 ENCOUNTER — Other Ambulatory Visit (HOSPITAL_BASED_OUTPATIENT_CLINIC_OR_DEPARTMENT_OTHER): Payer: 59

## 2017-02-11 DIAGNOSIS — T50905A Adverse effect of unspecified drugs, medicaments and biological substances, initial encounter: Secondary | ICD-10-CM

## 2017-02-11 DIAGNOSIS — Z95828 Presence of other vascular implants and grafts: Secondary | ICD-10-CM

## 2017-02-11 DIAGNOSIS — C5701 Malignant neoplasm of right fallopian tube: Secondary | ICD-10-CM

## 2017-02-11 DIAGNOSIS — C786 Secondary malignant neoplasm of retroperitoneum and peritoneum: Secondary | ICD-10-CM

## 2017-02-11 DIAGNOSIS — Z5111 Encounter for antineoplastic chemotherapy: Secondary | ICD-10-CM | POA: Diagnosis not present

## 2017-02-11 DIAGNOSIS — R739 Hyperglycemia, unspecified: Secondary | ICD-10-CM

## 2017-02-11 DIAGNOSIS — T148XXA Other injury of unspecified body region, initial encounter: Secondary | ICD-10-CM | POA: Insufficient documentation

## 2017-02-11 DIAGNOSIS — L24A9 Irritant contact dermatitis due friction or contact with other specified body fluids: Secondary | ICD-10-CM

## 2017-02-11 LAB — COMPREHENSIVE METABOLIC PANEL
ALBUMIN: 3.8 g/dL (ref 3.5–5.0)
ALK PHOS: 63 U/L (ref 40–150)
ALT: 25 U/L (ref 0–55)
ANION GAP: 13 meq/L — AB (ref 3–11)
AST: 19 U/L (ref 5–34)
BILIRUBIN TOTAL: 0.32 mg/dL (ref 0.20–1.20)
BUN: 16.9 mg/dL (ref 7.0–26.0)
CALCIUM: 9.3 mg/dL (ref 8.4–10.4)
CO2: 20 mEq/L — ABNORMAL LOW (ref 22–29)
Chloride: 105 mEq/L (ref 98–109)
Creatinine: 0.9 mg/dL (ref 0.6–1.1)
Glucose: 284 mg/dl — ABNORMAL HIGH (ref 70–140)
Potassium: 4.2 mEq/L (ref 3.5–5.1)
Sodium: 138 mEq/L (ref 136–145)
TOTAL PROTEIN: 7.6 g/dL (ref 6.4–8.3)

## 2017-02-11 LAB — CBC WITH DIFFERENTIAL/PLATELET
BASO%: 0.3 % (ref 0.0–2.0)
Basophils Absolute: 0 10*3/uL (ref 0.0–0.1)
EOS ABS: 0 10*3/uL (ref 0.0–0.5)
EOS%: 0 % (ref 0.0–7.0)
HCT: 37.7 % (ref 34.8–46.6)
HEMOGLOBIN: 12.6 g/dL (ref 11.6–15.9)
LYMPH%: 15 % (ref 14.0–49.7)
MCH: 30 pg (ref 25.1–34.0)
MCHC: 33.4 g/dL (ref 31.5–36.0)
MCV: 89.7 fL (ref 79.5–101.0)
MONO#: 0.1 10*3/uL (ref 0.1–0.9)
MONO%: 1 % (ref 0.0–14.0)
NEUT%: 83.7 % — ABNORMAL HIGH (ref 38.4–76.8)
NEUTROS ABS: 7.3 10*3/uL — AB (ref 1.5–6.5)
PLATELETS: 254 10*3/uL (ref 145–400)
RBC: 4.2 10*6/uL (ref 3.70–5.45)
RDW: 14.4 % (ref 11.2–14.5)
WBC: 8.7 10*3/uL (ref 3.9–10.3)
lymph#: 1.3 10*3/uL (ref 0.9–3.3)

## 2017-02-11 MED ORDER — FOSAPREPITANT DIMEGLUMINE INJECTION 150 MG
Freq: Once | INTRAVENOUS | Status: AC
  Administered 2017-02-11: 11:00:00 via INTRAVENOUS
  Filled 2017-02-11: qty 5

## 2017-02-11 MED ORDER — DEXAMETHASONE 4 MG PO TABS: ORAL_TABLET | ORAL | 1 refills | Status: DC

## 2017-02-11 MED ORDER — FAMOTIDINE IN NACL 20-0.9 MG/50ML-% IV SOLN
20.0000 mg | Freq: Once | INTRAVENOUS | Status: AC
  Administered 2017-02-11: 20 mg via INTRAVENOUS

## 2017-02-11 MED ORDER — SODIUM CHLORIDE 0.9% FLUSH
10.0000 mL | INTRAVENOUS | Status: DC | PRN
  Administered 2017-02-11: 10 mL via INTRAVENOUS
  Filled 2017-02-11: qty 10

## 2017-02-11 MED ORDER — CEPHALEXIN 500 MG PO CAPS: 500.0000 mg | ORAL_CAPSULE | Freq: Three times a day (TID) | ORAL | 0 refills | Status: DC

## 2017-02-11 MED ORDER — SODIUM CHLORIDE 0.9 % IV SOLN
Freq: Once | INTRAVENOUS | Status: AC
  Administered 2017-02-11: 10:00:00 via INTRAVENOUS

## 2017-02-11 MED ORDER — SODIUM CHLORIDE 0.9% FLUSH
10.0000 mL | INTRAVENOUS | Status: DC | PRN
  Administered 2017-02-11: 10 mL
  Filled 2017-02-11: qty 10

## 2017-02-11 MED ORDER — DIPHENHYDRAMINE HCL 50 MG/ML IJ SOLN
50.0000 mg | Freq: Once | INTRAMUSCULAR | Status: AC
  Administered 2017-02-11: 50 mg via INTRAVENOUS

## 2017-02-11 MED ORDER — FAMOTIDINE IN NACL 20-0.9 MG/50ML-% IV SOLN
INTRAVENOUS | Status: AC
Start: 2017-02-11 — End: ?
  Filled 2017-02-11: qty 50

## 2017-02-11 MED ORDER — DIPHENHYDRAMINE HCL 50 MG/ML IJ SOLN
INTRAMUSCULAR | Status: AC
  Filled 2017-02-11: qty 1

## 2017-02-11 MED ORDER — PALONOSETRON HCL INJECTION 0.25 MG/5ML
INTRAVENOUS | Status: AC
  Filled 2017-02-11: qty 5

## 2017-02-11 MED ORDER — HEPARIN SOD (PORK) LOCK FLUSH 100 UNIT/ML IV SOLN
500.0000 [IU] | Freq: Once | INTRAVENOUS | Status: AC | PRN
  Administered 2017-02-11: 500 [IU]
  Filled 2017-02-11: qty 5

## 2017-02-11 MED ORDER — PALONOSETRON HCL INJECTION 0.25 MG/5ML
0.2500 mg | Freq: Once | INTRAVENOUS | Status: AC
  Administered 2017-02-11: 0.25 mg via INTRAVENOUS

## 2017-02-11 MED ORDER — SODIUM CHLORIDE 0.9 % IV SOLN
588.6000 mg | Freq: Once | INTRAVENOUS | Status: AC
  Administered 2017-02-11: 590 mg via INTRAVENOUS
  Filled 2017-02-11: qty 59

## 2017-02-11 MED ORDER — SODIUM CHLORIDE 0.9 % IV SOLN
175.0000 mg/m2 | Freq: Once | INTRAVENOUS | Status: AC
  Administered 2017-02-11: 318 mg via INTRAVENOUS
  Filled 2017-02-11: qty 53

## 2017-02-11 MED FILL — DEXAMETHASONE 4 MG TABLET: 4 | 84 days supply | Qty: 40 | Fill #0

## 2017-02-11 MED FILL — CEPHALEXIN 500 MG CAPSULE: 500 | 7 days supply | Qty: 21 | Fill #0

## 2017-02-11 NOTE — Telephone Encounter (Signed)
Scheduled appt per 10/23 sch msg. Patient will get updated schedule in treatment area.

## 2017-02-11 NOTE — Telephone Encounter (Signed)
Printed avs and calender for upcoming appointment. Per 10/23 avs

## 2017-02-11 NOTE — Patient Instructions (Signed)
[facility] Discharge Instructions for Patients Receiving Chemotherapy  Today you received the following chemotherapy agents:  Taxol (paclitaxel), Carboplatin (paraplatin)  To help prevent nausea and vomiting after your treatment, we encourage you to take your nausea medication as prescribed.   If you develop nausea and vomiting that is not controlled by your nausea medication, call the clinic.   BELOW ARE SYMPTOMS THAT SHOULD BE REPORTED IMMEDIATELY:  *FEVER GREATER THAN 100.5 F  *CHILLS WITH OR WITHOUT FEVER  NAUSEA AND VOMITING THAT IS NOT CONTROLLED WITH YOUR NAUSEA MEDICATION  *UNUSUAL SHORTNESS OF BREATH  *UNUSUAL BRUISING OR BLEEDING  TENDERNESS IN MOUTH AND THROAT WITH OR WITHOUT PRESENCE OF ULCERS  *URINARY PROBLEMS  *BOWEL PROBLEMS  UNUSUAL RASH Items with * indicate a potential emergency and should be followed up as soon as possible.  Feel free to call the clinic should you have any questions or concerns. The clinic phone number is [phone].  Please show the [facility] at check-in to the Emergency Department and triage nurse.     Carboplatin injection What is this medicine? CARBOPLATIN (KAR boe pla tin) is a chemotherapy drug. It targets fast dividing cells, like cancer cells, and causes these cells to die. This medicine is used to treat ovarian cancer and many other cancers. This medicine may be used for other purposes; ask your health care provider or pharmacist if you have questions. COMMON BRAND NAME(S): Paraplatin What should I tell my health care provider before I take this medicine? They need to know if you have any of these conditions: -blood disorders -hearing problems -kidney disease -recent or ongoing radiation therapy -an unusual or allergic reaction to carboplatin, cisplatin, other chemotherapy, other medicines, foods, dyes, or preservatives -pregnant or trying to get pregnant -breast-feeding How should I use this  medicine? This drug is usually given as an infusion into a vein. It is administered in a hospital or clinic by a specially trained health care professional. Talk to your pediatrician regarding the use of this medicine in children. Special care may be needed. Overdosage: If you think you have taken too much of this medicine contact a poison control center or emergency room at once. NOTE: This medicine is only for you. Do not share this medicine with others. What if I miss a dose? It is important not to miss a dose. Call your doctor or health care professional if you are unable to keep an appointment. What may interact with this medicine? -medicines for seizures -medicines to increase blood counts like filgrastim, pegfilgrastim, sargramostim -some antibiotics like amikacin, gentamicin, neomycin, streptomycin, tobramycin -vaccines Talk to your doctor or health care professional before taking any of these medicines: -acetaminophen -aspirin -ibuprofen -ketoprofen -naproxen This list may not describe all possible interactions. Give your health care provider a list of all the medicines, herbs, non-prescription drugs, or dietary supplements you use. Also tell them if you smoke, drink alcohol, or use illegal drugs. Some items may interact with your medicine. What should I watch for while using this medicine? Your condition will be monitored carefully while you are receiving this medicine. You will need important blood work done while you are taking this medicine. This drug may make you feel generally unwell. This is not uncommon, as chemotherapy can affect healthy cells as well as cancer cells. Report any side effects. Continue your course of treatment even though you feel ill unless your doctor tells you to stop. In some cases, you may be given additional medicines  to help with side effects. Follow all directions for their use. Call your doctor or health care professional for advice if you get a  fever, chills or sore throat, or other symptoms of a cold or flu. Do not treat yourself. This drug decreases your body's ability to fight infections. Try to avoid being around people who are sick. This medicine may increase your risk to bruise or bleed. Call your doctor or health care professional if you notice any unusual bleeding. Be careful brushing and flossing your teeth or using a toothpick because you may get an infection or bleed more easily. If you have any dental work done, tell your dentist you are receiving this medicine. Avoid taking products that contain aspirin, acetaminophen, ibuprofen, naproxen, or ketoprofen unless instructed by your doctor. These medicines may hide a fever. Do not become pregnant while taking this medicine. Women should inform their doctor if they wish to become pregnant or think they might be pregnant. There is a potential for serious side effects to an unborn child. Talk to your health care professional or pharmacist for more information. Do not breast-feed an infant while taking this medicine. What side effects may I notice from receiving this medicine? Side effects that you should report to your doctor or health care professional as soon as possible: -allergic reactions like skin rash, itching or hives, swelling of the face, lips, or tongue -signs of infection - fever or chills, cough, sore throat, pain or difficulty passing urine -signs of decreased platelets or bleeding - bruising, pinpoint red spots on the skin, black, tarry stools, nosebleeds -signs of decreased red blood cells - unusually weak or tired, fainting spells, lightheadedness -breathing problems -changes in hearing -changes in vision -chest pain -high blood pressure -low blood counts - This drug may decrease the number of white blood cells, red blood cells and platelets. You may be at increased risk for infections and bleeding. -nausea and vomiting -pain, swelling, redness or irritation at the  injection site -pain, tingling, numbness in the hands or feet -problems with balance, talking, walking -trouble passing urine or change in the amount of urine Side effects that usually do not require medical attention (report to your doctor or health care professional if they continue or are bothersome): -hair loss -loss of appetite -metallic taste in the mouth or changes in taste This list may not describe all possible side effects. Call your doctor for medical advice about side effects. You may report side effects to FDA at 1-800-FDA-1088. Where should I keep my medicine? This drug is given in a hospital or clinic and will not be stored at home. NOTE: This sheet is a summary. It may not cover all possible information. If you have questions about this medicine, talk to your doctor, pharmacist, or health care provider.  2018 Elsevier/Gold Standard (2007-07-14 14:38:05)    Paclitaxel injection What is this medicine? PACLITAXEL (PAK li TAX el) is a chemotherapy drug. It targets fast dividing cells, like cancer cells, and causes these cells to die. This medicine is used to treat ovarian cancer, breast cancer, and other cancers. This medicine may be used for other purposes; ask your health care provider or pharmacist if you have questions. COMMON BRAND NAME(S): Onxol, Taxol What should I tell my health care provider before I take this medicine? They need to know if you have any of these conditions: -blood disorders -irregular heartbeat -infection (especially a virus infection such as chickenpox, cold sores, or herpes) -liver disease -previous or ongoing  radiation therapy -an unusual or allergic reaction to paclitaxel, alcohol, polyoxyethylated castor oil, other chemotherapy agents, other medicines, foods, dyes, or preservatives -pregnant or trying to get pregnant -breast-feeding How should I use this medicine? This drug is given as an infusion into a vein. It is administered in a  hospital or clinic by a specially trained health care professional. Talk to your pediatrician regarding the use of this medicine in children. Special care may be needed. Overdosage: If you think you have taken too much of this medicine contact a poison control center or emergency room at once. NOTE: This medicine is only for you. Do not share this medicine with others. What if I miss a dose? It is important not to miss your dose. Call your doctor or health care professional if you are unable to keep an appointment. What may interact with this medicine? Do not take this medicine with any of the following medications: -disulfiram -metronidazole This medicine may also interact with the following medications: -cyclosporine -diazepam -ketoconazole -medicines to increase blood counts like filgrastim, pegfilgrastim, sargramostim -other chemotherapy drugs like cisplatin, doxorubicin, epirubicin, etoposide, teniposide, vincristine -quinidine -testosterone -vaccines -verapamil Talk to your doctor or health care professional before taking any of these medicines: -acetaminophen -aspirin -ibuprofen -ketoprofen -naproxen This list may not describe all possible interactions. Give your health care provider a list of all the medicines, herbs, non-prescription drugs, or dietary supplements you use. Also tell them if you smoke, drink alcohol, or use illegal drugs. Some items may interact with your medicine. What should I watch for while using this medicine? Your condition will be monitored carefully while you are receiving this medicine. You will need important blood work done while you are taking this medicine. This medicine can cause serious allergic reactions. To reduce your risk you will need to take other medicine(s) before treatment with this medicine. If you experience allergic reactions like skin rash, itching or hives, swelling of the face, lips, or tongue, tell your doctor or health care  professional right away. In some cases, you may be given additional medicines to help with side effects. Follow all directions for their use. This drug may make you feel generally unwell. This is not uncommon, as chemotherapy can affect healthy cells as well as cancer cells. Report any side effects. Continue your course of treatment even though you feel ill unless your doctor tells you to stop. Call your doctor or health care professional for advice if you get a fever, chills or sore throat, or other symptoms of a cold or flu. Do not treat yourself. This drug decreases your body's ability to fight infections. Try to avoid being around people who are sick. This medicine may increase your risk to bruise or bleed. Call your doctor or health care professional if you notice any unusual bleeding. Be careful brushing and flossing your teeth or using a toothpick because you may get an infection or bleed more easily. If you have any dental work done, tell your dentist you are receiving this medicine. Avoid taking products that contain aspirin, acetaminophen, ibuprofen, naproxen, or ketoprofen unless instructed by your doctor. These medicines may hide a fever. Do not become pregnant while taking this medicine. Women should inform their doctor if they wish to become pregnant or think they might be pregnant. There is a potential for serious side effects to an unborn child. Talk to your health care professional or pharmacist for more information. Do not breast-feed an infant while taking this medicine. Men are advised  not to father a child while receiving this medicine. This product may contain alcohol. Ask your pharmacist or healthcare provider if this medicine contains alcohol. Be sure to tell all healthcare providers you are taking this medicine. Certain medicines, like metronidazole and disulfiram, can cause an unpleasant reaction when taken with alcohol. The reaction includes flushing, headache, nausea, vomiting,  sweating, and increased thirst. The reaction can last from 30 minutes to several hours. What side effects may I notice from receiving this medicine? Side effects that you should report to your doctor or health care professional as soon as possible: -allergic reactions like skin rash, itching or hives, swelling of the face, lips, or tongue -low blood counts - This drug may decrease the number of white blood cells, red blood cells and platelets. You may be at increased risk for infections and bleeding. -signs of infection - fever or chills, cough, sore throat, pain or difficulty passing urine -signs of decreased platelets or bleeding - bruising, pinpoint red spots on the skin, black, tarry stools, nosebleeds -signs of decreased red blood cells - unusually weak or tired, fainting spells, lightheadedness -breathing problems -chest pain -high or low blood pressure -mouth sores -nausea and vomiting -pain, swelling, redness or irritation at the injection site -pain, tingling, numbness in the hands or feet -slow or irregular heartbeat -swelling of the ankle, feet, hands Side effects that usually do not require medical attention (report to your doctor or health care professional if they continue or are bothersome): -bone pain -complete hair loss including hair on your head, underarms, pubic hair, eyebrows, and eyelashes -changes in the color of fingernails -diarrhea -loosening of the fingernails -loss of appetite -muscle or joint pain -red flush to skin -sweating This list may not describe all possible side effects. Call your doctor for medical advice about side effects. You may report side effects to FDA at 1-800-FDA-1088. Where should I keep my medicine? This drug is given in a hospital or clinic and will not be stored at home. NOTE: This sheet is a summary. It may not cover all possible information. If you have questions about this medicine, talk to your doctor, pharmacist, or health care  provider.  2018 Elsevier/Gold Standard (2015-02-07 19:58:00)    Fosaprepitant injection What is this medicine? FOSAPREPITANT (fos ap RE pi tant) is used together with other medicines to prevent nausea and vomiting caused by cancer treatment (chemotherapy). This medicine may be used for other purposes; ask your health care provider or pharmacist if you have questions. COMMON BRAND NAME(S): Emend What should I tell my health care provider before I take this medicine? They need to know if you have any of these conditions: -liver disease -an unusual or allergic reaction to fosaprepitant, aprepitant, medicines, foods, dyes, or preservatives -pregnant or trying to get pregnant -breast-feeding How should I use this medicine? This medicine is for injection into a vein. It is given by a health care professional in a hospital or clinic setting. Talk to your pediatrician regarding the use of this medicine in children. Special care may be needed. Overdosage: If you think you have taken too much of this medicine contact a poison control center or emergency room at once. NOTE: This medicine is only for you. Do not share this medicine with others. What if I miss a dose? This does not apply. What may interact with this medicine? Do not take this medicine with any of these medicines: -cisapride -flibanserin -lomitapide -pimozide This medicine may also interact with the following medications: -  diltiazem -female hormones, like estrogens or progestins and birth control pills -medicines for fungal infections like ketoconazole and itraconazole -medicines for HIV -medicines for seizures or to control epilepsy like carbamazepine or phenytoin -medicines used for sleep or anxiety disorders like alprazolam, diazepam, or midazolam -nefazodone -paroxetine -ranolazine -rifampin -some chemotherapy medications like etoposide, ifosfamide, vinblastine, vincristine -some antibiotics like clarithromycin,  erythromycin, troleandomycin -steroid medicines like dexamethasone or methylprednisolone -tolbutamide -warfarin This list may not describe all possible interactions. Give your health care provider a list of all the medicines, herbs, non-prescription drugs, or dietary supplements you use. Also tell them if you smoke, drink alcohol, or use illegal drugs. Some items may interact with your medicine. What should I watch for while using this medicine? Do not take this medicine if you already have nausea and vomiting. Ask your health care provider what to do if you already have nausea. Birth control pills and other methods of hormonal contraception (for example, IUD or patch) may not work properly while you are taking this medicine. Use an extra method of birth control during treatment and for 1 month after your last dose of fosaprepitant. This medicine should not be used continuously for a long time. Visit your doctor or health care professional for regular check-ups. This medicine may change your liver function blood test results. What side effects may I notice from receiving this medicine? Side effects that you should report to your doctor or health care professional as soon as possible: -allergic reactions like skin rash, itching or hives, swelling of the face, lips, or tongue -breathing problems -changes in heart rhythm -high or low blood pressure -pain, redness, or irritation at site where injected -rectal bleeding -serious dizziness or disorientation, confusion -sharp or severe stomach pain -sharp pain in your leg Side effects that usually do not require medical attention (report to your doctor or health care professional if they continue or are bothersome): -constipation or diarrhea -hair loss -headache -hiccups -loss of appetite -nausea -upset stomach -tiredness This list may not describe all possible side effects. Call your doctor for medical advice about side effects. You may report  side effects to FDA at 1-800-FDA-1088. Where should I keep my medicine? This drug is given in a hospital or clinic and will not be stored at home. NOTE: This sheet is a summary. It may not cover all possible information. If you have questions about this medicine, talk to your doctor, pharmacist, or health care provider.  2018 Elsevier/Gold Standard (2014-05-25 10:45:34)

## 2017-02-11 NOTE — Patient Instructions (Signed)
Implanted Port Home Guide An implanted port is a type of central line that is placed under the skin. Central lines are used to provide IV access when treatment or nutrition needs to be given through a person's veins. Implanted ports are used for long-term IV access. An implanted port may be placed because:  You need IV medicine that would be irritating to the small veins in your hands or arms.  You need long-term IV medicines, such as antibiotics.  You need IV nutrition for a long period.  You need frequent blood draws for lab tests.  You need dialysis.  Implanted ports are usually placed in the chest area, but they can also be placed in the upper arm, the abdomen, or the leg. An implanted port has two main parts:  Reservoir. The reservoir is round and will appear as a small, raised area under your skin. The reservoir is the part where a needle is inserted to give medicines or draw blood.  Catheter. The catheter is a thin, flexible tube that extends from the reservoir. The catheter is placed into a large vein. Medicine that is inserted into the reservoir goes into the catheter and then into the vein.  How will I care for my incision site? Do not get the incision site wet. Bathe or shower as directed by your health care provider. How is my port accessed? Special steps must be taken to access the port:  Before the port is accessed, a numbing cream can be placed on the skin. This helps numb the skin over the port site.  Your health care provider uses a sterile technique to access the port. ? Your health care provider must put on a mask and sterile gloves. ? The skin over your port is cleaned carefully with an antiseptic and allowed to dry. ? The port is gently pinched between sterile gloves, and a needle is inserted into the port.  Only "non-coring" port needles should be used to access the port. Once the port is accessed, a blood return should be checked. This helps ensure that the port  is in the vein and is not clogged.  If your port needs to remain accessed for a constant infusion, a clear (transparent) bandage will be placed over the needle site. The bandage and needle will need to be changed every week, or as directed by your health care provider.  Keep the bandage covering the needle clean and dry. Do not get it wet. Follow your health care provider's instructions on how to take a shower or bath while the port is accessed.  If your port does not need to stay accessed, no bandage is needed over the port.  What is flushing? Flushing helps keep the port from getting clogged. Follow your health care provider's instructions on how and when to flush the port. Ports are usually flushed with saline solution or a medicine called heparin. The need for flushing will depend on how the port is used.  If the port is used for intermittent medicines or blood draws, the port will need to be flushed: ? After medicines have been given. ? After blood has been drawn. ? As part of routine maintenance.  If a constant infusion is running, the port may not need to be flushed.  How long will my port stay implanted? The port can stay in for as long as your health care provider thinks it is needed. When it is time for the port to come out, surgery will be   done to remove it. The procedure is similar to the one performed when the port was put in. When should I seek immediate medical care? When you have an implanted port, you should seek immediate medical care if:  You notice a bad smell coming from the incision site.  You have swelling, redness, or drainage at the incision site.  You have more swelling or pain at the port site or the surrounding area.  You have a fever that is not controlled with medicine.  This information is not intended to replace advice given to you by your health care provider. Make sure you discuss any questions you have with your health care provider. Document  Released: 04/08/2005 Document Revised: 09/14/2015 Document Reviewed: 12/14/2012 Elsevier Interactive Patient Education  2017 [employer]  

## 2017-02-11 NOTE — Assessment & Plan Note (Signed)
She tolerated recent chemo well except for recent pancytopenia and mild wound discharge. Recent tumor markers are improving I will continue current dose without dose adjustment

## 2017-02-11 NOTE — Progress Notes (Signed)
[facility] OFFICE PROGRESS NOTE  Patient Care Team: Redmon, Noelle, PA-C as PCP - General (Nurse Practitioner) Theron Cumbie, MD as Consulting Physician (Hematology and Oncology) Rossi, Emma, MD as Consulting Physician (Obstetrics and Gynecology)  SUMMARY OF ONCOLOGIC HISTORY: Oncology History   Neg genetics     Adenocarcinoma of right fallopian tube (HCC)   08/12/2016 Initial Diagnosis    The patient has many months of vague symptoms of fullness in the pelvis, urinary frequency and difficulty with defecation. She denies vaginal bleeding or rectal bleeding.       08/12/2016 Imaging    Abdomen X-ray in the ER: Moderate colonic stool burden without evidence of enteric obstruction      11/13/2016 Imaging    TVUS was performed on 11/13/16 which showed a large hypoechoic lobular mass seen posterior to the uterus measuring 18.2x16.1x11.8cm, blood flow was seen along the periphery of the mass. It was considered to be likely to be a fibroid vs pelvic mass vs ovarian mass. Neither ovary was removed. Moderate amount of free fluid in the pelvis. THe uterus measures 4x10x4cm. The endometrium is 4mm.       12/05/2016 Imaging    MR pelvis 1. Mild limitations as detailed above. 2. Heterogeneous pelvic mass is favored to arise from the right ovary. Favor a solid ovarian neoplasm such as fibroma/Brenner's tumor. Given lesion size and heterogeneous T2 signal out, complicating torsion cannot be excluded. 3. Moderate abdominopelvic ascites, without specific evidence of peritoneal metastasis. Consider further evaluation with contrast-enhanced abdominopelvic CT.       12/26/2016 Pathology Results    1. Uterus, ovaries and fallopian tubes - ADENOCARCINOMA INVOLVING RIGHT FALLOPIAN TUBE, RIGHT AND LEFT OVARIES, UTERINE SEROSA AND PELVIC MASS. - SEE ONCOLOGY TABLE AND COMMENT. - UTERINE CERVIX, ENDOMETRIUM, MYOMETRIUM AND LEFT FALLOPIAN TUBE FREE OF TUMOR. 2. Omentum, resection for tumor -  ADENOCARCINOMA. Microscopic Comment 1. ONCOLOGY TABLE - FALLOPIAN TUBE. SEE COMMENT 1. Specimen, including laterality: Uterus, bilateral adnexa, pelvic mass and omentum. 2. Procedure: Hysterectomy with bilateral salpingo-oophorectomy, pelvic mass excision and omentectomy. 3. Lymph node sampling performed: No 4. Tumor site: See comment. 5. Tumor location in fallopian tube: Right fallopian tube fimbria 6. Specimen integrity (intact/ruptured/disrupted): Intact 7. Tumor size (cm): 18 cm, see comment. 8. Histologic type: Adenocarcinoma 9. Grade: High grade 10. Microscopic tumor extension: Tumor involves right fallopian tube, right and left ovaries, uterine serosa, pelvic mass and omentum. 11. Margins: See comment. 12. Lymph-Vascular invasion: Present 13. Lymph nodes: # examined: 0; # positive: N/A 14. TNM: pT3c, pNX 15. FIGO Stage (based on pathologic findings, needs clinical correlation: III-C 16. Comments: There is an 18 cm pelvic mass which is a high grade adenocarcinoma and there is a 12.2 cm  segment of omentum which is extensively involved with adenocarcinoma. The tumor also involves the fimbria of the right fallopian tube and the parenchyma of the right and left ovaries as well as uterine serosa. The fimbria of the right fallopian has intraepithelial atypia consistent with a precursor lesion and therefore this is most consistent with primary fallopian tube adenocarcinoma. A primary peritoneal serous carcinoma is a less likely possibility.       12/26/2016 Pathology Results    PERITONEAL/ASCITIC FLUID(SPECIMEN 1 OF 1 COLLECTED 12/26/16): POORLY DIFFERENTIATED ADENOCARCINOMA      12/26/2016 Surgery    Procedure(s) Performed: Exploratory laparotomy with total abdominal hysterectomy, bilateral salpingo-oophorectomy, omentectomy radical tumor debulking for ovarian cancer .  Surgeon: Emma C Rossi, MD.   Operative Findings: 20cm left   ovarian mass densely adherent to the posterior uterus,  right tube and ovary, cervix, sigmoid colon. 10cm omental cake. 4L ascites. 50m size tumor nodules on the serosa of the terminal ileum and proximal sigmoid colon. 157mnodules on right diaphragm.    This represented an optimal cytoreduction (R1) with 2m32modules on intestine and diaphragm representing gross visible disease      01/16/2017 Procedure    Placement of single lumen port a cath via right internal jugular vein. The catheter tip lies at the cavo-atrial junction. A power injectable port a cath was placed and is ready for immediate use.      01/17/2017 Imaging    1. 3.5 x 7.2 x 4.6 cm fluid collection along the vaginal cuff, posterior the bladder and extending into the right adnexal space. This lesion demonstrates rim enhancement. Imaging features could be related to a loculated postoperative seroma or hematoma. Superinfection cannot be excluded by CT. Given debulking surgery was 3 weeks ago, this entire structure is un likely to represent neoplasm, but peritoneal involvement could have this appearance. 2. Small fluid collection in the left para colic gutter without rim enhancement. 3. Irregular/nodular appearance of the peritoneal M in the anatomic pelvis with areas of subtle nodularity between the stomach and the spleen. Close attention in these regions on follow-up recommended as metastatic disease is a concern. 4. Mildly enlarged hepatoduodenal ligament lymph node. Attention on follow-up recommended.      01/20/2017 Tumor Marker    Patient's tumor was tested for the following markers: CA-125 Results of the tumor marker test revealed 46.6      01/28/2017 Genetic Testing    Negative genetic testing on the [facility].  The [facility] panel offered by [facility] sequencing and deletion/duplication testing of the following 28 genes: APC, ATM, BARD1, BMPR1A, BRCA1, BRCA2, BRIP1, CHD1, CDK4, CDKN2A, CHEK2, EPCAM (large rearrangement only), MLH1, MSH2, MSH6,  MUTYH, NBN, PALB2, PMS2, PTEN, RAD51C, RAD51D, SMAD4, STK11, and TP53. Sequencing was performed for select regions of POLE and POLD1, and large rearrangement analysis was performed for select regions of GREM1. The report date is January 28, 2017.  HRD testing looking for genomic instability and BRCA mutations was negative.  The report date of this test is January 27, 2017.       01/31/2017 Tumor Marker    Patient's tumor was tested for the following markers: CA-125 Results of the tumor marker test revealed 22.4       INTERVAL HISTORY: Please see below for problem oriented charting. She is seen prior to cycle 2 of chemo She had recent wound discharge, resolved with antibiotic No recent fevers or chills She denies peripheral neuropathy No recent nausea/vomiting  REVIEW OF SYSTEMS:   Constitutional: Denies fevers, chills or abnormal weight loss Eyes: Denies blurriness of vision Ears, nose, mouth, throat, and face: Denies mucositis or sore throat Respiratory: Denies cough, dyspnea or wheezes Cardiovascular: Denies palpitation, chest discomfort or lower extremity swelling Gastrointestinal:  Denies nausea, heartburn or change in bowel habits Skin: Denies abnormal skin rashes Lymphatics: Denies new lymphadenopathy or easy bruising Neurological:Denies numbness, tingling or new weaknesses Behavioral/Psych: Mood is stable, no new changes  All other systems were reviewed with the patient and are negative.  I have reviewed the past medical history, past surgical history, social history and family history with the patient and they are unchanged from previous note.  ALLERGIES:  is allergic to dilaudid [hydromorphone hcl] and sudafed [pseudoephedrine hcl].  MEDICATIONS:  Current  Outpatient Prescriptions  Medication Sig Dispense Refill  . amoxicillin-clavulanate (AUGMENTIN) 875-125 MG tablet Take 1 tablet by mouth 2 (two) times daily. 20 tablet 0  . cephALEXin (KEFLEX) 500 MG capsule Take 1  capsule (500 mg total) by mouth 3 (three) times daily. 21 capsule 0  . dexamethasone (DECADRON) 4 MG tablet 5 tabs the night before chemo and 5 tabs the morning of chemo, every 3 weeks x 6 40 tablet 1  . lidocaine-prilocaine (EMLA) cream Apply to affected area once 30 g 3  . losartan (COZAAR) 25 MG tablet Take 25 mg by mouth daily.     . naproxen sodium (ANAPROX) 220 MG tablet Take 220-440 mg by mouth 2 (two) times daily as needed (pain).     . ondansetron (ZOFRAN) 8 MG tablet Take 1 tablet (8 mg total) by mouth every 8 (eight) hours as needed for refractory nausea / vomiting. Start on day 3 after chemo. 30 tablet 1  . prochlorperazine (COMPAZINE) 10 MG tablet Take 1 tablet (10 mg total) by mouth every 6 (six) hours as needed (Nausea or vomiting). 30 tablet 1  . traMADol (ULTRAM) 50 MG tablet Take 1 tablet (50 mg total) by mouth every 6 (six) hours as needed for moderate pain. 30 tablet 0   No current facility-administered medications for this visit.    Facility-Administered Medications Ordered in Other Visits  Medication Dose Route Frequency Provider Last Rate Last Dose  . sodium chloride flush (NS) 0.9 % injection 10 mL  10 mL Intravenous PRN Alvy Bimler, Carmeron Heady, MD   10 mL at 02/11/17 0839    PHYSICAL EXAMINATION: ECOG PERFORMANCE STATUS: 1 - Symptomatic but completely ambulatory  Vitals:   02/11/17 0852  BP: (!) 159/81  Pulse: 96  Resp: 20  Temp: 97.9 F (36.6 C)  SpO2: 99%   Filed Weights   02/11/17 0852  Weight: 157 lb 14.4 oz (71.6 kg)    GENERAL:alert, no distress and comfortable SKIN: minimum drainage is noted from the lower portion of her abdominal wound wound. Nothing suspicious for cellulitis EYES: normal, Conjunctiva are pink and non-injected, sclera clear OROPHARYNX:no exudate, no erythema and lips, buccal mucosa, and tongue normal  NECK: supple, thyroid normal size, non-tender, without nodularity LYMPH:  no palpable lymphadenopathy in the cervical, axillary or  inguinal LUNGS: clear to auscultation and percussion with normal breathing effort HEART: regular rate & rhythm and no murmurs and no lower extremity edema ABDOMEN:abdomen soft, non-tender and normal bowel sounds Musculoskeletal:no cyanosis of digits and no clubbing  NEURO: alert & oriented x 3 with fluent speech, no focal motor/sensory deficits  LABORATORY DATA:  I have reviewed the data as listed    Component Value Date/Time   NA 138 02/11/2017 0832   K 4.2 02/11/2017 0832   CL 100 (L) 12/27/2016 0503   CO2 20 (L) 02/11/2017 0832   GLUCOSE 284 (H) 02/11/2017 0832   BUN 16.9 02/11/2017 0832   CREATININE 0.9 02/11/2017 0832   CALCIUM 9.3 02/11/2017 0832   PROT 7.6 02/11/2017 0832   ALBUMIN 3.8 02/11/2017 0832   AST 19 02/11/2017 0832   ALT 25 02/11/2017 0832   ALKPHOS 63 02/11/2017 0832   BILITOT 0.32 02/11/2017 0832   GFRNONAA >60 12/27/2016 0503   GFRAA >60 12/27/2016 0503    No results found for: SPEP, UPEP  Lab Results  Component Value Date   WBC 8.7 02/11/2017   NEUTROABS 7.3 (H) 02/11/2017   HGB 12.6 02/11/2017   HCT 37.7 02/11/2017  MCV 89.7 02/11/2017   PLT 254 02/11/2017      Chemistry      Component Value Date/Time   NA 138 02/11/2017 0832   K 4.2 02/11/2017 0832   CL 100 (L) 12/27/2016 0503   CO2 20 (L) 02/11/2017 0832   BUN 16.9 02/11/2017 0832   CREATININE 0.9 02/11/2017 0832      Component Value Date/Time   CALCIUM 9.3 02/11/2017 0832   ALKPHOS 63 02/11/2017 0832   AST 19 02/11/2017 0832   ALT 25 02/11/2017 0832   BILITOT 0.32 02/11/2017 0832       RADIOGRAPHIC STUDIES: I have personally reviewed the radiological images as listed and agreed with the findings in the report. Ct Chest W Contrast  Result Date: 01/17/2017 CLINICAL DATA:  stage IIIC high grade serous fallopian tube cancer, s/p ex lap, TAH, BSO, omentectomy, radical debulking on 12/26/16 EXAM: CT CHEST, ABDOMEN, AND PELVIS WITH CONTRAST TECHNIQUE: Multidetector CT imaging of the  chest, abdomen and pelvis was performed following the standard protocol during bolus administration of intravenous contrast. CONTRAST:  100 cc Isovue-300 COMPARISON:  None. FINDINGS: CT CHEST FINDINGS Cardiovascular: The heart size is normal. No pericardial effusion. No thoracic aortic aneurysm. Right-sided Port-A-Cath tip is at the distal SVC near the junction with the RA. Mediastinum/Nodes: No mediastinal lymphadenopathy. There is no hilar lymphadenopathy. The esophagus has normal imaging features. There is no axillary lymphadenopathy. Lungs/Pleura: Lungs are clear without suspicious pulmonary nodule or mass. No focal airspace consolidation. No pulmonary edema or pleural effusion. Musculoskeletal: Bone windows reveal no worrisome lytic or sclerotic osseous lesions. Edema and gas bubbles around the port device are compatible with placement yesterday. CT ABDOMEN PELVIS FINDINGS Hepatobiliary: No focal abnormality within the liver parenchyma. There is no evidence for gallstones, gallbladder wall thickening, or pericholecystic fluid. No intrahepatic or extrahepatic biliary dilation. Pancreas: No focal mass lesion. No dilatation of the main duct. No intraparenchymal cyst. No peripancreatic edema. Spleen: No splenomegaly. No focal mass lesion. Adrenals/Urinary Tract: No adrenal nodule or mass. 5 mm low-density lesion lower pole left kidney is too small to characterize but likely a cyst. Right kidney unremarkable. No evidence for hydroureter. The urinary bladder appears normal for the degree of distention. Stomach/Bowel: Tiny hiatal hernia. Stomach otherwise unremarkable. Duodenum is normally positioned as is the ligament of Treitz. No small bowel wall thickening. No small bowel dilatation. The terminal ileum is normal. Appendix not well seen but likely incorporated in some mesenteric fluid in the presacral region (images 92-99 of series 2). No gross colonic mass. No colonic wall thickening. No substantial diverticular  change. Vascular/Lymphatic: There is abdominal aortic atherosclerosis without aneurysm. Portal vein and superior mesenteric vein are patent. Splenic vein is patent. 12 mm short axis hepatoduodenal ligament lymph node (image 54 series 2) is mildly enlarged. Subtle nodularity identified between the stomach and the spleen (image 52 series 2). No retroperitoneal lymphadenopathy. No pelvic sidewall lymphadenopathy Reproductive: Uterus surgically absent. Other: 3.5 x 7.2 x 4.6 cm crescent-shaped fluid collection is identified in central pelvis, posterior to the bladder and along the vaginal cuff. This collection shows rim enhancement but no evidence for intralesional gas. Irregularity is seen along the peritoneal item lining both sides of the pelvis. There is subtle peritoneal irregularity along the left paracolic gutter and at the left wall of the left iliac crest, a 4.3 x 3.0 x 2.0 cm fluid collection is identified in the left para colic gutter without rim enhancement. Patient is status post omentectomy with straining in  the anterior abdominal wall fat presumably postoperative. Musculoskeletal: Bone windows reveal no worrisome lytic or sclerotic osseous lesions. Anterior midline abdominal scar compatible with history of exploratory laparotomy. Bone windows reveal no worrisome lytic or sclerotic osseous lesions. IMPRESSION: 1. 3.5 x 7.2 x 4.6 cm fluid collection along the vaginal cuff, posterior the bladder and extending into the right adnexal space. This lesion demonstrates rim enhancement. Imaging features could be related to a loculated postoperative seroma or hematoma. Superinfection cannot be excluded by CT. Given debulking surgery was 3 weeks ago, this entire structure is un likely to represent neoplasm, but peritoneal involvement could have this appearance. 2. Small fluid collection in the left para colic gutter without rim enhancement. 3. Irregular/nodular appearance of the peritoneal M in the anatomic pelvis  with areas of subtle nodularity between the stomach and the spleen. Close attention in these regions on follow-up recommended as metastatic disease is a concern. 4. Mildly enlarged hepatoduodenal ligament lymph node. Attention on follow-up recommended. Electronically Signed   By: Misty Stanley M.D.   On: 01/17/2017 15:13   Ct Abdomen Pelvis W Contrast  Result Date: 01/17/2017 CLINICAL DATA:  stage IIIC high grade serous fallopian tube cancer, s/p ex lap, TAH, BSO, omentectomy, radical debulking on 12/26/16 EXAM: CT CHEST, ABDOMEN, AND PELVIS WITH CONTRAST TECHNIQUE: Multidetector CT imaging of the chest, abdomen and pelvis was performed following the standard protocol during bolus administration of intravenous contrast. CONTRAST:  100 cc Isovue-300 COMPARISON:  None. FINDINGS: CT CHEST FINDINGS Cardiovascular: The heart size is normal. No pericardial effusion. No thoracic aortic aneurysm. Right-sided Port-A-Cath tip is at the distal SVC near the junction with the RA. Mediastinum/Nodes: No mediastinal lymphadenopathy. There is no hilar lymphadenopathy. The esophagus has normal imaging features. There is no axillary lymphadenopathy. Lungs/Pleura: Lungs are clear without suspicious pulmonary nodule or mass. No focal airspace consolidation. No pulmonary edema or pleural effusion. Musculoskeletal: Bone windows reveal no worrisome lytic or sclerotic osseous lesions. Edema and gas bubbles around the port device are compatible with placement yesterday. CT ABDOMEN PELVIS FINDINGS Hepatobiliary: No focal abnormality within the liver parenchyma. There is no evidence for gallstones, gallbladder wall thickening, or pericholecystic fluid. No intrahepatic or extrahepatic biliary dilation. Pancreas: No focal mass lesion. No dilatation of the main duct. No intraparenchymal cyst. No peripancreatic edema. Spleen: No splenomegaly. No focal mass lesion. Adrenals/Urinary Tract: No adrenal nodule or mass. 5 mm low-density lesion lower  pole left kidney is too small to characterize but likely a cyst. Right kidney unremarkable. No evidence for hydroureter. The urinary bladder appears normal for the degree of distention. Stomach/Bowel: Tiny hiatal hernia. Stomach otherwise unremarkable. Duodenum is normally positioned as is the ligament of Treitz. No small bowel wall thickening. No small bowel dilatation. The terminal ileum is normal. Appendix not well seen but likely incorporated in some mesenteric fluid in the presacral region (images 92-99 of series 2). No gross colonic mass. No colonic wall thickening. No substantial diverticular change. Vascular/Lymphatic: There is abdominal aortic atherosclerosis without aneurysm. Portal vein and superior mesenteric vein are patent. Splenic vein is patent. 12 mm short axis hepatoduodenal ligament lymph node (image 54 series 2) is mildly enlarged. Subtle nodularity identified between the stomach and the spleen (image 52 series 2). No retroperitoneal lymphadenopathy. No pelvic sidewall lymphadenopathy Reproductive: Uterus surgically absent. Other: 3.5 x 7.2 x 4.6 cm crescent-shaped fluid collection is identified in central pelvis, posterior to the bladder and along the vaginal cuff. This collection shows rim enhancement but no evidence for intralesional  gas. Irregularity is seen along the peritoneal item lining both sides of the pelvis. There is subtle peritoneal irregularity along the left paracolic gutter and at the left wall of the left iliac crest, a 4.3 x 3.0 x 2.0 cm fluid collection is identified in the left para colic gutter without rim enhancement. Patient is status post omentectomy with straining in the anterior abdominal wall fat presumably postoperative. Musculoskeletal: Bone windows reveal no worrisome lytic or sclerotic osseous lesions. Anterior midline abdominal scar compatible with history of exploratory laparotomy. Bone windows reveal no worrisome lytic or sclerotic osseous lesions. IMPRESSION:  1. 3.5 x 7.2 x 4.6 cm fluid collection along the vaginal cuff, posterior the bladder and extending into the right adnexal space. This lesion demonstrates rim enhancement. Imaging features could be related to a loculated postoperative seroma or hematoma. Superinfection cannot be excluded by CT. Given debulking surgery was 3 weeks ago, this entire structure is un likely to represent neoplasm, but peritoneal involvement could have this appearance. 2. Small fluid collection in the left para colic gutter without rim enhancement. 3. Irregular/nodular appearance of the peritoneal M in the anatomic pelvis with areas of subtle nodularity between the stomach and the spleen. Close attention in these regions on follow-up recommended as metastatic disease is a concern. 4. Mildly enlarged hepatoduodenal ligament lymph node. Attention on follow-up recommended. Electronically Signed   By: Misty Stanley M.D.   On: 01/17/2017 15:13   Ir US Guide Vasc Access Right  Result Date: 01/16/2017 CLINICAL DATA:  Fallopian tube carcinoma and need for porta cath to begin chemotherapy. EXAM: IMPLANTED PORT A CATH PLACEMENT WITH ULTRASOUND AND FLUOROSCOPIC GUIDANCE ANESTHESIA/SEDATION: 2.0 mg IV Versed; 75 mcg IV Fentanyl Total Moderate Sedation Time:  33 minutes The patient's level of consciousness and physiologic status were continuously monitored during the procedure by Radiology nursing. Additional Medications: 2 g IV Ancef. As antibiotic prophylaxis, Ancef was ordered pre-procedure and administered intravenously within one hour of incision. FLUOROSCOPY TIME:  24 seconds.  3.1 mGy. PROCEDURE: The procedure, risks, benefits, and alternatives were explained to the patient. Questions regarding the procedure were encouraged and answered. The patient understands and consents to the procedure. A time-out was performed prior to initiating the procedure. Ultrasound was utilized to confirm patency of the right internal jugular vein. The right neck  and chest were prepped with chlorhexidine in a sterile fashion, and a sterile drape was applied covering the operative field. Maximum barrier sterile technique with sterile gowns and gloves were used for the procedure. Local anesthesia was provided with 1% lidocaine. After creating a small venotomy incision, a 21 gauge needle was advanced into the right internal jugular vein under direct, real-time ultrasound guidance. Ultrasound image documentation was performed. After securing guidewire access, an 8 Fr dilator was placed. A J-wire was kinked to measure appropriate catheter length. A subcutaneous port pocket was then created along the upper chest wall utilizing sharp and blunt dissection. Portable cautery was utilized. The pocket was irrigated with sterile saline. A single lumen power injectable port was chosen for placement. The 8 Fr catheter was tunneled from the port pocket site to the venotomy incision. The port was placed in the pocket. External catheter was trimmed to appropriate length based on guidewire measurement. At the venotomy, an 8 Fr peel-away sheath was placed over a guidewire. The catheter was then placed through the sheath and the sheath removed. Final catheter positioning was confirmed and documented with a fluoroscopic spot image. The port was accessed with a needle  and aspirated and flushed with heparinized saline. The access needle was removed. The venotomy and port pocket incisions were closed with subcutaneous 3-0 Monocryl and subcuticular 4-0 Vicryl. Dermabond was applied to both incisions. COMPLICATIONS: COMPLICATIONS None FINDINGS: After catheter placement, the tip lies at the cavo-atrial junction. The catheter aspirates normally and is ready for immediate use. IMPRESSION: Placement of single lumen port a cath via right internal jugular vein. The catheter tip lies at the cavo-atrial junction. A power injectable port a cath was placed and is ready for immediate use. Electronically Signed    By: Aletta Edouard M.D.   On: 01/16/2017 11:09   Ir Fluoro Guide Port Insertion Right  Result Date: 01/16/2017 CLINICAL DATA:  Fallopian tube carcinoma and need for porta cath to begin chemotherapy. EXAM: IMPLANTED PORT A CATH PLACEMENT WITH ULTRASOUND AND FLUOROSCOPIC GUIDANCE ANESTHESIA/SEDATION: 2.0 mg IV Versed; 75 mcg IV Fentanyl Total Moderate Sedation Time:  33 minutes The patient's level of consciousness and physiologic status were continuously monitored during the procedure by Radiology nursing. Additional Medications: 2 g IV Ancef. As antibiotic prophylaxis, Ancef was ordered pre-procedure and administered intravenously within one hour of incision. FLUOROSCOPY TIME:  24 seconds.  3.1 mGy. PROCEDURE: The procedure, risks, benefits, and alternatives were explained to the patient. Questions regarding the procedure were encouraged and answered. The patient understands and consents to the procedure. A time-out was performed prior to initiating the procedure. Ultrasound was utilized to confirm patency of the right internal jugular vein. The right neck and chest were prepped with chlorhexidine in a sterile fashion, and a sterile drape was applied covering the operative field. Maximum barrier sterile technique with sterile gowns and gloves were used for the procedure. Local anesthesia was provided with 1% lidocaine. After creating a small venotomy incision, a 21 gauge needle was advanced into the right internal jugular vein under direct, real-time ultrasound guidance. Ultrasound image documentation was performed. After securing guidewire access, an 8 Fr dilator was placed. A J-wire was kinked to measure appropriate catheter length. A subcutaneous port pocket was then created along the upper chest wall utilizing sharp and blunt dissection. Portable cautery was utilized. The pocket was irrigated with sterile saline. A single lumen power injectable port was chosen for placement. The 8 Fr catheter was tunneled  from the port pocket site to the venotomy incision. The port was placed in the pocket. External catheter was trimmed to appropriate length based on guidewire measurement. At the venotomy, an 8 Fr peel-away sheath was placed over a guidewire. The catheter was then placed through the sheath and the sheath removed. Final catheter positioning was confirmed and documented with a fluoroscopic spot image. The port was accessed with a needle and aspirated and flushed with heparinized saline. The access needle was removed. The venotomy and port pocket incisions were closed with subcutaneous 3-0 Monocryl and subcuticular 4-0 Vicryl. Dermabond was applied to both incisions. COMPLICATIONS: COMPLICATIONS None FINDINGS: After catheter placement, the tip lies at the cavo-atrial junction. The catheter aspirates normally and is ready for immediate use. IMPRESSION: Placement of single lumen port a cath via right internal jugular vein. The catheter tip lies at the cavo-atrial junction. A power injectable port a cath was placed and is ready for immediate use. Electronically Signed   By: Aletta Edouard M.D.   On: 01/16/2017 11:09    ASSESSMENT & PLAN:  Adenocarcinoma of right fallopian tube [facility]) She tolerated recent chemo well except for recent pancytopenia and mild wound discharge. Recent tumor markers are  improving I will continue current dose without dose adjustment  Wound drainage She has minimum wound discharge, clinically does not appear infected. I educated patient to watch out for signs and symptoms of infection I gave her a prescription of antibiotics to hang on to I do not feel strongly she needs GCSF support  Drug-induced hyperglycemia This is due to steroids induced hyperglycemia Plan to observe only   No orders of the defined types were placed in this encounter.  All questions were answered. The patient knows to call the clinic with any problems, questions or concerns. No barriers to learning was  detected. I spent 15 minutes counseling the patient face to face. The total time spent in the appointment was 20 minutes and more than 50% was on counseling and review of test results     Heath Lark, MD 02/11/2017 7:15 PM

## 2017-02-11 NOTE — Assessment & Plan Note (Signed)
This is due to steroids induced hyperglycemia Plan to observe only

## 2017-02-11 NOTE — Assessment & Plan Note (Signed)
She has minimum wound discharge, clinically does not appear infected. I educated patient to watch out for signs and symptoms of infection I gave her a prescription of antibiotics to hang on to I do not feel strongly she needs GCSF support

## 2017-02-13 ENCOUNTER — Ambulatory Visit: Payer: 59

## 2017-02-18 ENCOUNTER — Telehealth: Payer: Self-pay | Admitting: *Deleted

## 2017-02-18 NOTE — Telephone Encounter (Signed)
Notified of message below

## 2017-02-18 NOTE — Telephone Encounter (Signed)
Mild reaction. I wont worry about it OK to take benadryl as needed

## 2017-02-18 NOTE — Telephone Encounter (Signed)
Pt reports that she started itching "all over- faces arms, legs and trunck" on Tuesday afternoon. No rash noted. Took benadryl and has resolved. States the only thing she has done differently- took 1 antibiotic pill Sunday and 2 on Monday. Has stopped antibiotics (Keflex)

## 2017-02-20 MED FILL — PROCHLORPERAZINE 10 MG TAB: 10 | 7 days supply | Qty: 30 | Fill #1

## 2017-03-04 ENCOUNTER — Encounter: Payer: Self-pay | Admitting: Hematology and Oncology

## 2017-03-04 ENCOUNTER — Ambulatory Visit (HOSPITAL_BASED_OUTPATIENT_CLINIC_OR_DEPARTMENT_OTHER): Payer: 59

## 2017-03-04 ENCOUNTER — Telehealth: Payer: Self-pay | Admitting: Hematology and Oncology

## 2017-03-04 ENCOUNTER — Ambulatory Visit (HOSPITAL_BASED_OUTPATIENT_CLINIC_OR_DEPARTMENT_OTHER): Payer: 59 | Admitting: Hematology and Oncology

## 2017-03-04 ENCOUNTER — Ambulatory Visit: Payer: 59

## 2017-03-04 ENCOUNTER — Other Ambulatory Visit (HOSPITAL_BASED_OUTPATIENT_CLINIC_OR_DEPARTMENT_OTHER): Payer: 59

## 2017-03-04 DIAGNOSIS — G620 Drug-induced polyneuropathy: Secondary | ICD-10-CM | POA: Diagnosis not present

## 2017-03-04 DIAGNOSIS — C5701 Malignant neoplasm of right fallopian tube: Secondary | ICD-10-CM | POA: Diagnosis not present

## 2017-03-04 DIAGNOSIS — Z95828 Presence of other vascular implants and grafts: Secondary | ICD-10-CM

## 2017-03-04 DIAGNOSIS — N951 Menopausal and female climacteric states: Secondary | ICD-10-CM

## 2017-03-04 DIAGNOSIS — Z5111 Encounter for antineoplastic chemotherapy: Secondary | ICD-10-CM

## 2017-03-04 DIAGNOSIS — R739 Hyperglycemia, unspecified: Secondary | ICD-10-CM | POA: Diagnosis not present

## 2017-03-04 DIAGNOSIS — T50905A Adverse effect of unspecified drugs, medicaments and biological substances, initial encounter: Secondary | ICD-10-CM

## 2017-03-04 DIAGNOSIS — C786 Secondary malignant neoplasm of retroperitoneum and peritoneum: Secondary | ICD-10-CM

## 2017-03-04 DIAGNOSIS — T451X5A Adverse effect of antineoplastic and immunosuppressive drugs, initial encounter: Secondary | ICD-10-CM | POA: Insufficient documentation

## 2017-03-04 LAB — CBC WITH DIFFERENTIAL/PLATELET
BASO%: 0.2 % (ref 0.0–2.0)
Basophils Absolute: 0 10*3/uL (ref 0.0–0.1)
EOS ABS: 0 10*3/uL (ref 0.0–0.5)
EOS%: 0 % (ref 0.0–7.0)
HCT: 35.2 % (ref 34.8–46.6)
HGB: 11.9 g/dL (ref 11.6–15.9)
LYMPH%: 15.2 % (ref 14.0–49.7)
MCH: 30.3 pg (ref 25.1–34.0)
MCHC: 33.8 g/dL (ref 31.5–36.0)
MCV: 89.4 fL (ref 79.5–101.0)
MONO#: 0.1 10*3/uL (ref 0.1–0.9)
MONO%: 0.7 % (ref 0.0–14.0)
NEUT#: 6.9 10*3/uL — ABNORMAL HIGH (ref 1.5–6.5)
NEUT%: 83.9 % — AB (ref 38.4–76.8)
Platelets: 333 10*3/uL (ref 145–400)
RBC: 3.93 10*6/uL (ref 3.70–5.45)
RDW: 14.9 % — ABNORMAL HIGH (ref 11.2–14.5)
WBC: 8.2 10*3/uL (ref 3.9–10.3)
lymph#: 1.3 10*3/uL (ref 0.9–3.3)

## 2017-03-04 LAB — COMPREHENSIVE METABOLIC PANEL
ALT: 20 U/L (ref 0–55)
AST: 15 U/L (ref 5–34)
Albumin: 3.7 g/dL (ref 3.5–5.0)
Alkaline Phosphatase: 68 U/L (ref 40–150)
Anion Gap: 11 mEq/L (ref 3–11)
BUN: 16.1 mg/dL (ref 7.0–26.0)
CHLORIDE: 105 meq/L (ref 98–109)
CO2: 21 meq/L — AB (ref 22–29)
Calcium: 9.6 mg/dL (ref 8.4–10.4)
Creatinine: 0.9 mg/dL (ref 0.6–1.1)
EGFR: >60 mL/min/{1.73_m2} (ref >60)
GLUCOSE: 273 mg/dL — AB (ref 70–140)
POTASSIUM: 4.2 meq/L (ref 3.5–5.1)
SODIUM: 137 meq/L (ref 136–145)
Total Bilirubin: 0.3 mg/dL (ref 0.20–1.20)
Total Protein: 7.6 g/dL (ref 6.4–8.3)

## 2017-03-04 MED ORDER — HEPARIN SOD (PORK) LOCK FLUSH 100 UNIT/ML IV SOLN
500.0000 [IU] | Freq: Once | INTRAVENOUS | Status: AC | PRN
  Administered 2017-03-04: 500 [IU]
  Filled 2017-03-04: qty 5

## 2017-03-04 MED ORDER — SODIUM CHLORIDE 0.9 % IV SOLN
590.0000 mg | Freq: Once | INTRAVENOUS | Status: AC
  Administered 2017-03-04: 590 mg via INTRAVENOUS
  Filled 2017-03-04: qty 59

## 2017-03-04 MED ORDER — FOSAPREPITANT DIMEGLUMINE INJECTION 150 MG
Freq: Once | INTRAVENOUS | Status: AC
  Administered 2017-03-04: 10:00:00 via INTRAVENOUS
  Filled 2017-03-04: qty 5

## 2017-03-04 MED ORDER — PALONOSETRON HCL INJECTION 0.25 MG/5ML
INTRAVENOUS | Status: AC
  Filled 2017-03-04: qty 5

## 2017-03-04 MED ORDER — SODIUM CHLORIDE 0.9 % IV SOLN
Freq: Once | INTRAVENOUS | Status: AC
  Administered 2017-03-04: 10:00:00 via INTRAVENOUS

## 2017-03-04 MED ORDER — PALONOSETRON HCL INJECTION 0.25 MG/5ML
0.2500 mg | Freq: Once | INTRAVENOUS | Status: AC
  Administered 2017-03-04: 0.25 mg via INTRAVENOUS

## 2017-03-04 MED ORDER — DIPHENHYDRAMINE HCL 50 MG/ML IJ SOLN
INTRAMUSCULAR | Status: AC
  Filled 2017-03-04: qty 1

## 2017-03-04 MED ORDER — FAMOTIDINE IN NACL 20-0.9 MG/50ML-% IV SOLN
20.0000 mg | Freq: Once | INTRAVENOUS | Status: AC
  Administered 2017-03-04: 20 mg via INTRAVENOUS

## 2017-03-04 MED ORDER — SODIUM CHLORIDE 0.9 % IV SOLN
175.0000 mg/m2 | Freq: Once | INTRAVENOUS | Status: AC
  Administered 2017-03-04: 318 mg via INTRAVENOUS
  Filled 2017-03-04: qty 53

## 2017-03-04 MED ORDER — FAMOTIDINE IN NACL 20-0.9 MG/50ML-% IV SOLN
INTRAVENOUS | Status: AC
  Filled 2017-03-04: qty 50

## 2017-03-04 MED ORDER — SODIUM CHLORIDE 0.9% FLUSH
10.0000 mL | INTRAVENOUS | Status: DC | PRN
  Administered 2017-03-04 (×2): 10 mL via INTRAVENOUS
  Filled 2017-03-04: qty 10

## 2017-03-04 MED ORDER — SODIUM CHLORIDE 0.9% FLUSH
10.0000 mL | INTRAVENOUS | Status: DC | PRN
  Filled 2017-03-04: qty 10

## 2017-03-04 MED ORDER — DIPHENHYDRAMINE HCL 50 MG/ML IJ SOLN
50.0000 mg | Freq: Once | INTRAMUSCULAR | Status: AC
  Administered 2017-03-04: 50 mg via INTRAVENOUS

## 2017-03-04 NOTE — Patient Instructions (Signed)
[facility] Discharge Instructions for Patients Receiving Chemotherapy  Today you received the following chemotherapy agents:  Taxol, Carboplatin  To help prevent nausea and vomiting after your treatment, we encourage you to take your nausea medication as prescribed.   If you develop nausea and vomiting that is not controlled by your nausea medication, call the clinic.   BELOW ARE SYMPTOMS THAT SHOULD BE REPORTED IMMEDIATELY:  *FEVER GREATER THAN 100.5 F  *CHILLS WITH OR WITHOUT FEVER  NAUSEA AND VOMITING THAT IS NOT CONTROLLED WITH YOUR NAUSEA MEDICATION  *UNUSUAL SHORTNESS OF BREATH  *UNUSUAL BRUISING OR BLEEDING  TENDERNESS IN MOUTH AND THROAT WITH OR WITHOUT PRESENCE OF ULCERS  *URINARY PROBLEMS  *BOWEL PROBLEMS  UNUSUAL RASH Items with * indicate a potential emergency and should be followed up as soon as possible.  Feel free to call the clinic should you have any questions or concerns. The clinic phone number is [phone].  Please show the CHEMO ALERT CARD at check-in to the Emergency Department and triage nurse.   

## 2017-03-04 NOTE — Assessment & Plan Note (Signed)
This is due to steroids induced hyperglycemia Plan to observe only

## 2017-03-04 NOTE — Patient Instructions (Signed)
Implanted Port Home Guide An implanted port is a type of central line that is placed under the skin. Central lines are used to provide IV access when treatment or nutrition needs to be given through a person's veins. Implanted ports are used for long-term IV access. An implanted port may be placed because:  You need IV medicine that would be irritating to the small veins in your hands or arms.  You need long-term IV medicines, such as antibiotics.  You need IV nutrition for a long period.  You need frequent blood draws for lab tests.  You need dialysis.  Implanted ports are usually placed in the chest area, but they can also be placed in the upper arm, the abdomen, or the leg. An implanted port has two main parts:  Reservoir. The reservoir is round and will appear as a small, raised area under your skin. The reservoir is the part where a needle is inserted to give medicines or draw blood.  Catheter. The catheter is a thin, flexible tube that extends from the reservoir. The catheter is placed into a large vein. Medicine that is inserted into the reservoir goes into the catheter and then into the vein.  How will I care for my incision site? Do not get the incision site wet. Bathe or shower as directed by your health care provider. How is my port accessed? Special steps must be taken to access the port:  Before the port is accessed, a numbing cream can be placed on the skin. This helps numb the skin over the port site.  Your health care provider uses a sterile technique to access the port. ? Your health care provider must put on a mask and sterile gloves. ? The skin over your port is cleaned carefully with an antiseptic and allowed to dry. ? The port is gently pinched between sterile gloves, and a needle is inserted into the port.  Only "non-coring" port needles should be used to access the port. Once the port is accessed, a blood return should be checked. This helps ensure that the port  is in the vein and is not clogged.  If your port needs to remain accessed for a constant infusion, a clear (transparent) bandage will be placed over the needle site. The bandage and needle will need to be changed every week, or as directed by your health care provider.  Keep the bandage covering the needle clean and dry. Do not get it wet. Follow your health care provider's instructions on how to take a shower or bath while the port is accessed.  If your port does not need to stay accessed, no bandage is needed over the port.  What is flushing? Flushing helps keep the port from getting clogged. Follow your health care provider's instructions on how and when to flush the port. Ports are usually flushed with saline solution or a medicine called heparin. The need for flushing will depend on how the port is used.  If the port is used for intermittent medicines or blood draws, the port will need to be flushed: ? After medicines have been given. ? After blood has been drawn. ? As part of routine maintenance.  If a constant infusion is running, the port may not need to be flushed.  How long will my port stay implanted? The port can stay in for as long as your health care provider thinks it is needed. When it is time for the port to come out, surgery will be   done to remove it. The procedure is similar to the one performed when the port was put in. When should I seek immediate medical care? When you have an implanted port, you should seek immediate medical care if:  You notice a bad smell coming from the incision site.  You have swelling, redness, or drainage at the incision site.  You have more swelling or pain at the port site or the surrounding area.  You have a fever that is not controlled with medicine.  This information is not intended to replace advice given to you by your health care provider. Make sure you discuss any questions you have with your health care provider. Document  Released: 04/08/2005 Document Revised: 09/14/2015 Document Reviewed: 12/14/2012 Elsevier Interactive Patient Education  2017 [employer]  

## 2017-03-04 NOTE — Assessment & Plan Note (Signed)
She has menopausal hot flashes symptoms We discussed conservative management versus hormone replacement therapy The patient is interested to start hormone replacement therapy I would alert her GYN oncologist to set up appointment to discuss this with the patient

## 2017-03-04 NOTE — Progress Notes (Signed)
[facility] OFFICE PROGRESS NOTE  Patient Care Team: Redmon, Noelle, PA-C as PCP - General (Nurse Practitioner) Christropher Gintz, MD as Consulting Physician (Hematology and Oncology) Rossi, Emma, MD as Consulting Physician (Obstetrics and Gynecology)  SUMMARY OF ONCOLOGIC HISTORY: Oncology History   Neg genetics     Adenocarcinoma of right fallopian tube (HCC)   08/12/2016 Initial Diagnosis    The patient has many months of vague symptoms of fullness in the pelvis, urinary frequency and difficulty with defecation. She denies vaginal bleeding or rectal bleeding.       08/12/2016 Imaging    Abdomen X-ray in the ER: Moderate colonic stool burden without evidence of enteric obstruction      11/13/2016 Imaging    TVUS was performed on 11/13/16 which showed a large hypoechoic lobular mass seen posterior to the uterus measuring 18.2x16.1x11.8cm, blood flow was seen along the periphery of the mass. It was considered to be likely to be a fibroid vs pelvic mass vs ovarian mass. Neither ovary was removed. Moderate amount of free fluid in the pelvis. THe uterus measures 4x10x4cm. The endometrium is 4mm.       12/05/2016 Imaging    MR pelvis 1. Mild limitations as detailed above. 2. Heterogeneous pelvic mass is favored to arise from the right ovary. Favor a solid ovarian neoplasm such as fibroma/Brenner's tumor. Given lesion size and heterogeneous T2 signal out, complicating torsion cannot be excluded. 3. Moderate abdominopelvic ascites, without specific evidence of peritoneal metastasis. Consider further evaluation with contrast-enhanced abdominopelvic CT.       12/26/2016 Pathology Results    1. Uterus, ovaries and fallopian tubes - ADENOCARCINOMA INVOLVING RIGHT FALLOPIAN TUBE, RIGHT AND LEFT OVARIES, UTERINE SEROSA AND PELVIC MASS. - SEE ONCOLOGY TABLE AND COMMENT. - UTERINE CERVIX, ENDOMETRIUM, MYOMETRIUM AND LEFT FALLOPIAN TUBE FREE OF TUMOR. 2. Omentum, resection for tumor -  ADENOCARCINOMA. Microscopic Comment 1. ONCOLOGY TABLE - FALLOPIAN TUBE. SEE COMMENT 1. Specimen, including laterality: Uterus, bilateral adnexa, pelvic mass and omentum. 2. Procedure: Hysterectomy with bilateral salpingo-oophorectomy, pelvic mass excision and omentectomy. 3. Lymph node sampling performed: No 4. Tumor site: See comment. 5. Tumor location in fallopian tube: Right fallopian tube fimbria 6. Specimen integrity (intact/ruptured/disrupted): Intact 7. Tumor size (cm): 18 cm, see comment. 8. Histologic type: Adenocarcinoma 9. Grade: High grade 10. Microscopic tumor extension: Tumor involves right fallopian tube, right and left ovaries, uterine serosa, pelvic mass and omentum. 11. Margins: See comment. 12. Lymph-Vascular invasion: Present 13. Lymph nodes: # examined: 0; # positive: N/A 14. TNM: pT3c, pNX 15. FIGO Stage (based on pathologic findings, needs clinical correlation: III-C 16. Comments: There is an 18 cm pelvic mass which is a high grade adenocarcinoma and there is a 12.2 cm  segment of omentum which is extensively involved with adenocarcinoma. The tumor also involves the fimbria of the right fallopian tube and the parenchyma of the right and left ovaries as well as uterine serosa. The fimbria of the right fallopian has intraepithelial atypia consistent with a precursor lesion and therefore this is most consistent with primary fallopian tube adenocarcinoma. A primary peritoneal serous carcinoma is a less likely possibility.       12/26/2016 Pathology Results    PERITONEAL/ASCITIC FLUID(SPECIMEN 1 OF 1 COLLECTED 12/26/16): POORLY DIFFERENTIATED ADENOCARCINOMA      12/26/2016 Surgery    Procedure(s) Performed: Exploratory laparotomy with total abdominal hysterectomy, bilateral salpingo-oophorectomy, omentectomy radical tumor debulking for ovarian cancer .  Surgeon: Emma C Rossi, MD.   Operative Findings: 20cm left   ovarian mass densely adherent to the posterior uterus,  right tube and ovary, cervix, sigmoid colon. 10cm omental cake. 4L ascites. 92m size tumor nodules on the serosa of the terminal ileum and proximal sigmoid colon. 139mnodules on right diaphragm.    This represented an optimal cytoreduction (R1) with 42m40modules on intestine and diaphragm representing gross visible disease      01/16/2017 Procedure    Placement of single lumen port a cath via right internal jugular vein. The catheter tip lies at the cavo-atrial junction. A power injectable port a cath was placed and is ready for immediate use.      01/17/2017 Imaging    1. 3.5 x 7.2 x 4.6 cm fluid collection along the vaginal cuff, posterior the bladder and extending into the right adnexal space. This lesion demonstrates rim enhancement. Imaging features could be related to a loculated postoperative seroma or hematoma. Superinfection cannot be excluded by CT. Given debulking surgery was 3 weeks ago, this entire structure is un likely to represent neoplasm, but peritoneal involvement could have this appearance. 2. Small fluid collection in the left para colic gutter without rim enhancement. 3. Irregular/nodular appearance of the peritoneal M in the anatomic pelvis with areas of subtle nodularity between the stomach and the spleen. Close attention in these regions on follow-up recommended as metastatic disease is a concern. 4. Mildly enlarged hepatoduodenal ligament lymph node. Attention on follow-up recommended.      01/20/2017 Tumor Marker    Patient's tumor was tested for the following markers: CA-125 Results of the tumor marker test revealed 46.6      01/28/2017 Genetic Testing    Negative genetic testing on the [facility].  The [facility] panel offered by [employer] [employer] sequencing and deletion/duplication testing of the following 28 genes: APC, ATM, BARD1, BMPR1A, BRCA1, BRCA2, BRIP1, CHD1, CDK4, CDKN2A, CHEK2, EPCAM (large rearrangement only), MLH1, MSH2, MSH6,  MUTYH, NBN, PALB2, PMS2, PTEN, RAD51C, RAD51D, SMAD4, STK11, and TP53. Sequencing was performed for select regions of POLE and POLD1, and large rearrangement analysis was performed for select regions of GREM1. The report date is January 28, 2017.  HRD testing looking for genomic instability and BRCA mutations was negative.  The report date of this test is January 27, 2017.       01/31/2017 Tumor Marker    Patient's tumor was tested for the following markers: CA-125 Results of the tumor marker test revealed 22.4       INTERVAL HISTORY: Please see below for problem oriented charting. She returns to be seen prior to cycle 3 of chemotherapy Her wound drainage has resolved She complained of significant hot flashes symptoms that bothers her She also complained of very mild peripheral neuropathy that comes and goes She denies nausea, vomiting or changes in bowel habits  REVIEW OF SYSTEMS:   Constitutional: Denies fevers, chills or abnormal weight loss Eyes: Denies blurriness of vision Ears, nose, mouth, throat, and face: Denies mucositis or sore throat Respiratory: Denies cough, dyspnea or wheezes Cardiovascular: Denies palpitation, chest discomfort or lower extremity swelling Gastrointestinal:  Denies nausea, heartburn or change in bowel habits Skin: Denies abnormal skin rashes Lymphatics: Denies new lymphadenopathy or easy bruising Neurological:Denies numbness, tingling or new weaknesses Behavioral/Psych: Mood is stable, no new changes  All other systems were reviewed with the patient and are negative.  I have reviewed the past medical history, past surgical history, social history and family history with the patient and they are unchanged from previous  note.  ALLERGIES:  is allergic to dilaudid [hydromorphone hcl] and sudafed [pseudoephedrine hcl].  MEDICATIONS:  Current Outpatient Medications  Medication Sig Dispense Refill  . amoxicillin-clavulanate (AUGMENTIN) 875-125 MG tablet  Take 1 tablet by mouth 2 (two) times daily. 20 tablet 0  . cephALEXin (KEFLEX) 500 MG capsule Take 1 capsule (500 mg total) by mouth 3 (three) times daily. 21 capsule 0  . dexamethasone (DECADRON) 4 MG tablet 5 tabs the night before chemo and 5 tabs the morning of chemo, every 3 weeks x 6 40 tablet 1  . lidocaine-prilocaine (EMLA) cream Apply to affected area once 30 g 3  . losartan (COZAAR) 25 MG tablet Take 25 mg by mouth daily.     . naproxen sodium (ANAPROX) 220 MG tablet Take 220-440 mg by mouth 2 (two) times daily as needed (pain).     . ondansetron (ZOFRAN) 8 MG tablet Take 1 tablet (8 mg total) by mouth every 8 (eight) hours as needed for refractory nausea / vomiting. Start on day 3 after chemo. 30 tablet 1  . prochlorperazine (COMPAZINE) 10 MG tablet Take 1 tablet (10 mg total) by mouth every 6 (six) hours as needed (Nausea or vomiting). 30 tablet 1  . traMADol (ULTRAM) 50 MG tablet Take 1 tablet (50 mg total) by mouth every 6 (six) hours as needed for moderate pain. 30 tablet 0   No current facility-administered medications for this visit.    Facility-Administered Medications Ordered in Other Visits  Medication Dose Route Frequency Provider Last Rate Last Dose  . CARBOplatin (PARAPLATIN) 590 mg in sodium chloride 0.9 % 250 mL chemo infusion  590 mg Intravenous Once Alvy Bimler, Maresha Anastos, MD      . heparin lock flush 100 unit/mL  500 Units Intracatheter Once PRN Alvy Bimler, Gracin Mcpartland, MD      . PACLitaxel (TAXOL) 318 mg in sodium chloride 0.9 % 500 mL chemo infusion (> 52m/m2)  175 mg/m2 (Treatment Plan Recorded) Intravenous Once GHeath Lark MD 184 mL/hr at 03/04/17 1107 318 mg at 03/04/17 1107  . sodium chloride flush (NS) 0.9 % injection 10 mL  10 mL Intravenous PRN GHeath Lark MD   10 mL at 02/11/17 0839  . sodium chloride flush (NS) 0.9 % injection 10 mL  10 mL Intravenous PRN GAlvy Bimler Candon Caras, MD   10 mL at 03/04/17 0830  . sodium chloride flush (NS) 0.9 % injection 10 mL  10 mL Intracatheter PRN GAlvy Bimler  Antonino Nienhuis, MD        PHYSICAL EXAMINATION: ECOG PERFORMANCE STATUS: 1 - Symptomatic but completely ambulatory  Vitals:   03/04/17 0846  BP: (!) 162/90  Pulse: 95  Resp: 18  Temp: 98.2 F (36.8 C)  SpO2: 99%   Filed Weights   03/04/17 0846  Weight: 160 lb 6.4 oz (72.8 kg)    GENERAL:alert, no distress and comfortable SKIN: skin color, texture, turgor are normal, no rashes or significant lesions EYES: normal, Conjunctiva are pink and non-injected, sclera clear OROPHARYNX:no exudate, no erythema and lips, buccal mucosa, and tongue normal  NECK: supple, thyroid normal size, non-tender, without nodularity LYMPH:  no palpable lymphadenopathy in the cervical, axillary or inguinal LUNGS: clear to auscultation and percussion with normal breathing effort HEART: regular rate & rhythm and no murmurs and no lower extremity edema ABDOMEN:abdomen soft, non-tender and normal bowel sounds Musculoskeletal:no cyanosis of digits and no clubbing  NEURO: alert & oriented x 3 with fluent speech, no focal motor/sensory deficits  LABORATORY DATA:  I have reviewed the data  as listed    Component Value Date/Time   NA 137 03/04/2017 0820   K 4.2 03/04/2017 0820   CL 100 (L) 12/27/2016 0503   CO2 21 (L) 03/04/2017 0820   GLUCOSE 273 (H) 03/04/2017 0820   BUN 16.1 03/04/2017 0820   CREATININE 0.9 03/04/2017 0820   CALCIUM 9.6 03/04/2017 0820   PROT 7.6 03/04/2017 0820   ALBUMIN 3.7 03/04/2017 0820   AST 15 03/04/2017 0820   ALT 20 03/04/2017 0820   ALKPHOS 68 03/04/2017 0820   BILITOT 0.30 03/04/2017 0820   GFRNONAA >60 12/27/2016 0503   GFRAA >60 12/27/2016 0503    No results found for: SPEP, UPEP  Lab Results  Component Value Date   WBC 8.2 03/04/2017   NEUTROABS 6.9 (H) 03/04/2017   HGB 11.9 03/04/2017   HCT 35.2 03/04/2017   MCV 89.4 03/04/2017   PLT 333 03/04/2017      Chemistry      Component Value Date/Time   NA 137 03/04/2017 0820   K 4.2 03/04/2017 0820   CL 100 (L)  12/27/2016 0503   CO2 21 (L) 03/04/2017 0820   BUN 16.1 03/04/2017 0820   CREATININE 0.9 03/04/2017 0820      Component Value Date/Time   CALCIUM 9.6 03/04/2017 0820   ALKPHOS 68 03/04/2017 0820   AST 15 03/04/2017 0820   ALT 20 03/04/2017 0820   BILITOT 0.30 03/04/2017 0820     ASSESSMENT & PLAN:  Adenocarcinoma of right fallopian tube ([place]) She tolerated treatment very well except for mild peripheral neuropathy Recent tumor marker has normalized We will proceed with cycle 3 of treatment without dose adjustment   Peripheral neuropathy due to chemotherapy [facility]) she has mild peripheral neuropathy, likely related to side effects of treatment. It is only mild, not bothering the patient. I will observe for now If it gets worse in the future, I will consider modifying the dose of the treatment   Drug-induced hyperglycemia This is due to steroids induced hyperglycemia Plan to observe only  Hot flashes, menopausal She has menopausal hot flashes symptoms We discussed conservative management versus hormone replacement therapy The patient is interested to start hormone replacement therapy I would alert her GYN oncologist to set up appointment to discuss this with the patient   No orders of the defined types were placed in this encounter.  All questions were answered. The patient knows to call the clinic with any problems, questions or concerns. No barriers to learning was detected. I spent 15 minutes counseling the patient face to face. The total time spent in the appointment was 20 minutes and more than 50% was on counseling and review of test results     Heath Lark, MD 03/04/2017 1:34 PM

## 2017-03-04 NOTE — Telephone Encounter (Signed)
Gave avs and calendar for December  °

## 2017-03-04 NOTE — Assessment & Plan Note (Signed)
She tolerated treatment very well except for mild peripheral neuropathy Recent tumor marker has normalized We will proceed with cycle 3 of treatment without dose adjustment

## 2017-03-04 NOTE — Assessment & Plan Note (Signed)
she has mild peripheral neuropathy, likely related to side effects of treatment. It is only mild, not bothering the patient. I will observe for now If it gets worse in the future, I will consider modifying the dose of the treatment  

## 2017-03-05 ENCOUNTER — Other Ambulatory Visit: Payer: Self-pay | Admitting: Gynecologic Oncology

## 2017-03-05 ENCOUNTER — Telehealth: Payer: Self-pay

## 2017-03-05 ENCOUNTER — Telehealth: Payer: Self-pay | Admitting: Gynecologic Oncology

## 2017-03-05 DIAGNOSIS — N951 Menopausal and female climacteric states: Secondary | ICD-10-CM

## 2017-03-05 LAB — CA 125: CANCER ANTIGEN (CA) 125: 13.8 U/mL (ref 0.0–38.1)

## 2017-03-05 MED ORDER — VENLAFAXINE HCL ER 37.5 MG PO CP24: 37.5000 mg | ORAL_CAPSULE | Freq: Every day | ORAL | 6 refills | Status: DC

## 2017-03-05 MED ORDER — VENLAFAXINE HCL 37.5 MG PO TABS: 37.5000 mg | ORAL_TABLET | Freq: Every day | ORAL | 3 refills | Status: DC

## 2017-03-05 NOTE — Telephone Encounter (Signed)
-----   Message from Heath Lark, MD sent at 03/05/2017  7:45 AM EST ----- Regarding: labs pls call her with results of CA125 ----- Message ----- From: Interface, Lab In Three Zero One Sent: 03/04/2017   8:46 AM To: Heath Lark, MD

## 2017-03-05 NOTE — Telephone Encounter (Signed)
Called to follow up with patient about hot flashes.  Per Dr. Denman George, patient can begin effexor 37.5 mg XL daily for hot flash symptoms.  She did not want to initiate HRT at this time due to increased risk of cardiovascular event.  Patient agreeable with the plan.  Dr. Alvy Bimler aware and agreeable with GYN ONC prescribing the medication.  Reportable signs and symptoms reviewed.  Patient advised to call with an update on her symptoms and for any questions or concerns.

## 2017-03-05 NOTE — Telephone Encounter (Signed)
Left message, instructed to call nurse for questions.

## 2017-03-08 ENCOUNTER — Other Ambulatory Visit: Payer: Self-pay | Admitting: Oncology

## 2017-03-08 NOTE — Progress Notes (Signed)
The patient called today to report that she was feeling lightheaded.  I reviewed her labs.  She had a normal hemoglobin, normal renal function and normal liver function 1113.  Her blood sugar has been high likely because of Decadron.  She may be feeling lightheaded partly because that she is off steroids and partly because she may have been diuresing because of the elevated blood sugar.  I recommend that she drink a quart of Gatorade or similar and will call back if that does not resolve the problem

## 2017-03-20 ENCOUNTER — Telehealth: Payer: Self-pay | Admitting: Medical Oncology

## 2017-03-20 NOTE — Telephone Encounter (Signed)
She has tramadol to take as needed for pain

## 2017-03-20 NOTE — Telephone Encounter (Signed)
Pt notified to ouse tramadol.  She said the product was "CBD.", I told her not to use it.

## 2017-03-20 NOTE — Telephone Encounter (Signed)
Pt called to ask if she can take "CBC supplement for pain" .I returned call and left message that it is not recommended to take OTC supplements with unknown ingredients.

## 2017-03-25 ENCOUNTER — Encounter: Payer: Self-pay | Admitting: Hematology and Oncology

## 2017-03-25 ENCOUNTER — Other Ambulatory Visit (HOSPITAL_BASED_OUTPATIENT_CLINIC_OR_DEPARTMENT_OTHER): Payer: 59

## 2017-03-25 ENCOUNTER — Ambulatory Visit (HOSPITAL_BASED_OUTPATIENT_CLINIC_OR_DEPARTMENT_OTHER): Payer: 59 | Admitting: Hematology and Oncology

## 2017-03-25 ENCOUNTER — Ambulatory Visit: Payer: 59

## 2017-03-25 ENCOUNTER — Ambulatory Visit (HOSPITAL_BASED_OUTPATIENT_CLINIC_OR_DEPARTMENT_OTHER): Payer: 59

## 2017-03-25 DIAGNOSIS — C5701 Malignant neoplasm of right fallopian tube: Secondary | ICD-10-CM

## 2017-03-25 DIAGNOSIS — T451X5A Adverse effect of antineoplastic and immunosuppressive drugs, initial encounter: Secondary | ICD-10-CM

## 2017-03-25 DIAGNOSIS — R739 Hyperglycemia, unspecified: Secondary | ICD-10-CM

## 2017-03-25 DIAGNOSIS — C786 Secondary malignant neoplasm of retroperitoneum and peritoneum: Secondary | ICD-10-CM | POA: Diagnosis not present

## 2017-03-25 DIAGNOSIS — T50905A Adverse effect of unspecified drugs, medicaments and biological substances, initial encounter: Secondary | ICD-10-CM

## 2017-03-25 DIAGNOSIS — G620 Drug-induced polyneuropathy: Secondary | ICD-10-CM | POA: Diagnosis not present

## 2017-03-25 DIAGNOSIS — Z5111 Encounter for antineoplastic chemotherapy: Secondary | ICD-10-CM

## 2017-03-25 LAB — CBC WITH DIFFERENTIAL/PLATELET
BASO%: 0.3 % (ref 0.0–2.0)
Basophils Absolute: 0 10*3/uL (ref 0.0–0.1)
EOS%: 0 % (ref 0.0–7.0)
Eosinophils Absolute: 0 10*3/uL (ref 0.0–0.5)
HCT: 32.6 % — ABNORMAL LOW (ref 34.8–46.6)
HGB: 10.8 g/dL — ABNORMAL LOW (ref 11.6–15.9)
LYMPH%: 13.8 % — AB (ref 14.0–49.7)
MCH: 30.4 pg (ref 25.1–34.0)
MCHC: 33.2 g/dL (ref 31.5–36.0)
MCV: 91.5 fL (ref 79.5–101.0)
MONO#: 0 10*3/uL — ABNORMAL LOW (ref 0.1–0.9)
MONO%: 0.7 % (ref 0.0–14.0)
NEUT#: 5.6 10*3/uL (ref 1.5–6.5)
NEUT%: 85.2 % — AB (ref 38.4–76.8)
PLATELETS: 302 10*3/uL (ref 145–400)
RBC: 3.56 10*6/uL — AB (ref 3.70–5.45)
RDW: 17 % — ABNORMAL HIGH (ref 11.2–14.5)
WBC: 6.6 10*3/uL (ref 3.9–10.3)
lymph#: 0.9 10*3/uL (ref 0.9–3.3)

## 2017-03-25 LAB — COMPREHENSIVE METABOLIC PANEL
ALBUMIN: 3.7 g/dL (ref 3.5–5.0)
ALK PHOS: 72 U/L (ref 40–150)
ALT: 16 U/L (ref 0–55)
ANION GAP: 12 meq/L — AB (ref 3–11)
AST: 17 U/L (ref 5–34)
BILIRUBIN TOTAL: 0.35 mg/dL (ref 0.20–1.20)
BUN: 14.7 mg/dL (ref 7.0–26.0)
CALCIUM: 9.7 mg/dL (ref 8.4–10.4)
CO2: 22 mEq/L (ref 22–29)
CREATININE: 0.9 mg/dL (ref 0.6–1.1)
Chloride: 104 mEq/L (ref 98–109)
EGFR: >60 mL/min/{1.73_m2} (ref >60)
Glucose: 280 mg/dl — ABNORMAL HIGH (ref 70–140)
Potassium: 4.2 mEq/L (ref 3.5–5.1)
Sodium: 138 mEq/L (ref 136–145)
Total Protein: 7.3 g/dL (ref 6.4–8.3)

## 2017-03-25 LAB — TECHNOLOGIST REVIEW: Technologist Review: 1

## 2017-03-25 MED ORDER — FAMOTIDINE IN NACL 20-0.9 MG/50ML-% IV SOLN
20.0000 mg | Freq: Once | INTRAVENOUS | Status: AC
  Administered 2017-03-25: 20 mg via INTRAVENOUS

## 2017-03-25 MED ORDER — DIPHENHYDRAMINE HCL 50 MG/ML IJ SOLN
INTRAMUSCULAR | Status: AC
  Filled 2017-03-25: qty 1

## 2017-03-25 MED ORDER — SODIUM CHLORIDE 0.9% FLUSH
10.0000 mL | INTRAVENOUS | Status: DC | PRN
  Administered 2017-03-25: 10 mL
  Filled 2017-03-25: qty 10

## 2017-03-25 MED ORDER — HEPARIN SOD (PORK) LOCK FLUSH 100 UNIT/ML IV SOLN
500.0000 [IU] | Freq: Once | INTRAVENOUS | Status: AC | PRN
  Administered 2017-03-25: 500 [IU]
  Filled 2017-03-25: qty 5

## 2017-03-25 MED ORDER — SODIUM CHLORIDE 0.9 % IV SOLN
590.0000 mg | Freq: Once | INTRAVENOUS | Status: AC
  Administered 2017-03-25: 590 mg via INTRAVENOUS
  Filled 2017-03-25: qty 59

## 2017-03-25 MED ORDER — DIPHENHYDRAMINE HCL 50 MG/ML IJ SOLN
25.0000 mg | Freq: Once | INTRAMUSCULAR | Status: AC
  Administered 2017-03-25: 25 mg via INTRAVENOUS

## 2017-03-25 MED ORDER — FOSAPREPITANT DIMEGLUMINE INJECTION 150 MG
Freq: Once | INTRAVENOUS | Status: AC
  Administered 2017-03-25: 09:00:00 via INTRAVENOUS
  Filled 2017-03-25: qty 5

## 2017-03-25 MED ORDER — SODIUM CHLORIDE 0.9 % IV SOLN
175.0000 mg/m2 | Freq: Once | INTRAVENOUS | Status: AC
  Administered 2017-03-25: 318 mg via INTRAVENOUS
  Filled 2017-03-25: qty 53

## 2017-03-25 MED ORDER — FAMOTIDINE IN NACL 20-0.9 MG/50ML-% IV SOLN
INTRAVENOUS | Status: AC
  Filled 2017-03-25: qty 50

## 2017-03-25 MED ORDER — PALONOSETRON HCL INJECTION 0.25 MG/5ML
INTRAVENOUS | Status: AC
  Filled 2017-03-25: qty 5

## 2017-03-25 MED ORDER — SODIUM CHLORIDE 0.9% FLUSH
10.0000 mL | Freq: Once | INTRAVENOUS | Status: AC
  Administered 2017-03-25: 10 mL
  Filled 2017-03-25: qty 10

## 2017-03-25 MED ORDER — SODIUM CHLORIDE 0.9 % IV SOLN
Freq: Once | INTRAVENOUS | Status: AC
  Administered 2017-03-25: 09:00:00 via INTRAVENOUS

## 2017-03-25 MED ORDER — PALONOSETRON HCL INJECTION 0.25 MG/5ML
0.2500 mg | Freq: Once | INTRAVENOUS | Status: AC
  Administered 2017-03-25: 0.25 mg via INTRAVENOUS

## 2017-03-25 NOTE — Progress Notes (Signed)
[facility] OFFICE PROGRESS NOTE  Patient Care Team: Redmon, Noelle, PA-C as PCP - General (Nurse Practitioner) Ovida Delagarza, MD as Consulting Physician (Hematology and Oncology) Rossi, Emma, MD as Consulting Physician (Obstetrics and Gynecology)  SUMMARY OF ONCOLOGIC HISTORY: Oncology History   Neg genetics     Adenocarcinoma of right fallopian tube (HCC)   08/12/2016 Initial Diagnosis    The patient has many months of vague symptoms of fullness in the pelvis, urinary frequency and difficulty with defecation. She denies vaginal bleeding or rectal bleeding.       08/12/2016 Imaging    Abdomen X-ray in the ER: Moderate colonic stool burden without evidence of enteric obstruction      11/13/2016 Imaging    TVUS was performed on 11/13/16 which showed a large hypoechoic lobular mass seen posterior to the uterus measuring 18.2x16.1x11.8cm, blood flow was seen along the periphery of the mass. It was considered to be likely to be a fibroid vs pelvic mass vs ovarian mass. Neither ovary was removed. Moderate amount of free fluid in the pelvis. THe uterus measures 4x10x4cm. The endometrium is 4mm.       12/05/2016 Imaging    MR pelvis 1. Mild limitations as detailed above. 2. Heterogeneous pelvic mass is favored to arise from the right ovary. Favor a solid ovarian neoplasm such as fibroma/Brenner's tumor. Given lesion size and heterogeneous T2 signal out, complicating torsion cannot be excluded. 3. Moderate abdominopelvic ascites, without specific evidence of peritoneal metastasis. Consider further evaluation with contrast-enhanced abdominopelvic CT.       12/26/2016 Pathology Results    1. Uterus, ovaries and fallopian tubes - ADENOCARCINOMA INVOLVING RIGHT FALLOPIAN TUBE, RIGHT AND LEFT OVARIES, UTERINE SEROSA AND PELVIC MASS. - SEE ONCOLOGY TABLE AND COMMENT. - UTERINE CERVIX, ENDOMETRIUM, MYOMETRIUM AND LEFT FALLOPIAN TUBE FREE OF TUMOR. 2. Omentum, resection for tumor -  ADENOCARCINOMA. Microscopic Comment 1. ONCOLOGY TABLE - FALLOPIAN TUBE. SEE COMMENT 1. Specimen, including laterality: Uterus, bilateral adnexa, pelvic mass and omentum. 2. Procedure: Hysterectomy with bilateral salpingo-oophorectomy, pelvic mass excision and omentectomy. 3. Lymph node sampling performed: No 4. Tumor site: See comment. 5. Tumor location in fallopian tube: Right fallopian tube fimbria 6. Specimen integrity (intact/ruptured/disrupted): Intact 7. Tumor size (cm): 18 cm, see comment. 8. Histologic type: Adenocarcinoma 9. Grade: High grade 10. Microscopic tumor extension: Tumor involves right fallopian tube, right and left ovaries, uterine serosa, pelvic mass and omentum. 11. Margins: See comment. 12. Lymph-Vascular invasion: Present 13. Lymph nodes: # examined: 0; # positive: N/A 14. TNM: pT3c, pNX 15. FIGO Stage (based on pathologic findings, needs clinical correlation: III-C 16. Comments: There is an 18 cm pelvic mass which is a high grade adenocarcinoma and there is a 12.2 cm  segment of omentum which is extensively involved with adenocarcinoma. The tumor also involves the fimbria of the right fallopian tube and the parenchyma of the right and left ovaries as well as uterine serosa. The fimbria of the right fallopian has intraepithelial atypia consistent with a precursor lesion and therefore this is most consistent with primary fallopian tube adenocarcinoma. A primary peritoneal serous carcinoma is a less likely possibility.       12/26/2016 Pathology Results    PERITONEAL/ASCITIC FLUID(SPECIMEN 1 OF 1 COLLECTED 12/26/16): POORLY DIFFERENTIATED ADENOCARCINOMA      12/26/2016 Surgery    Procedure(s) Performed: Exploratory laparotomy with total abdominal hysterectomy, bilateral salpingo-oophorectomy, omentectomy radical tumor debulking for ovarian cancer .  Surgeon: Emma C Rossi, MD.   Operative Findings: 20cm left   ovarian mass densely adherent to the posterior uterus,  right tube and ovary, cervix, sigmoid colon. 10cm omental cake. 4L ascites. 80m size tumor nodules on the serosa of the terminal ileum and proximal sigmoid colon. 130mnodules on right diaphragm.    This represented an optimal cytoreduction (R1) with 41m80modules on intestine and diaphragm representing gross visible disease      01/16/2017 Procedure    Placement of single lumen port a cath via right internal jugular vein. The catheter tip lies at the cavo-atrial junction. A power injectable port a cath was placed and is ready for immediate use.      01/17/2017 Imaging    1. 3.5 x 7.2 x 4.6 cm fluid collection along the vaginal cuff, posterior the bladder and extending into the right adnexal space. This lesion demonstrates rim enhancement. Imaging features could be related to a loculated postoperative seroma or hematoma. Superinfection cannot be excluded by CT. Given debulking surgery was 3 weeks ago, this entire structure is un likely to represent neoplasm, but peritoneal involvement could have this appearance. 2. Small fluid collection in the left para colic gutter without rim enhancement. 3. Irregular/nodular appearance of the peritoneal M in the anatomic pelvis with areas of subtle nodularity between the stomach and the spleen. Close attention in these regions on follow-up recommended as metastatic disease is a concern. 4. Mildly enlarged hepatoduodenal ligament lymph node. Attention on follow-up recommended.      01/20/2017 Tumor Marker    Patient's tumor was tested for the following markers: CA-125 Results of the tumor marker test revealed 46.6      01/28/2017 Genetic Testing    Negative genetic testing on the [facility].  The [facility] panel offered by [employer] sequencing and deletion/duplication testing of the following 28 genes: APC, ATM, BARD1, BMPR1A, BRCA1, BRCA2, BRIP1, CHD1, CDK4, CDKN2A, CHEK2, EPCAM (large rearrangement only), MLH1, MSH2, MSH6,  MUTYH, NBN, PALB2, PMS2, PTEN, RAD51C, RAD51D, SMAD4, STK11, and TP53. Sequencing was performed for select regions of POLE and POLD1, and large rearrangement analysis was performed for select regions of GREM1. The report date is January 28, 2017.  HRD testing looking for genomic instability and BRCA mutations was negative.  The report date of this test is January 27, 2017.       01/31/2017 Tumor Marker    Patient's tumor was tested for the following markers: CA-125 Results of the tumor marker test revealed 22.4      03/04/2017 Tumor Marker    Patient's tumor was tested for the following markers: CA-125 Results of the tumor marker test revealed 13.8       INTERVAL HISTORY: Please see below for problem oriented charting. She returns prior to cycle 4 of chemotherapy She had reduced appetite for a few days after last chemo Denies nausea or vomiting Peripheral neuropathy is about the same, grade 1 only No recent infection  REVIEW OF SYSTEMS:   Constitutional: Denies fevers, chills or abnormal weight loss Eyes: Denies blurriness of vision Ears, nose, mouth, throat, and face: Denies mucositis or sore throat Respiratory: Denies cough, dyspnea or wheezes Cardiovascular: Denies palpitation, chest discomfort or lower extremity swelling Gastrointestinal:  Denies nausea, heartburn or change in bowel habits Skin: Denies abnormal skin rashes Lymphatics: Denies new lymphadenopathy or easy bruising Neurological:Denies numbness, tingling or new weaknesses Behavioral/Psych: Mood is stable, no new changes  All other systems were reviewed with the patient and are negative.  I have reviewed the past medical history, past  surgical history, social history and family history with the patient and they are unchanged from previous note.  ALLERGIES:  is allergic to dilaudid [hydromorphone hcl] and sudafed [pseudoephedrine hcl].  MEDICATIONS:  Current Outpatient Medications  Medication Sig Dispense  Refill  . dexamethasone (DECADRON) 4 MG tablet 5 tabs the night before chemo and 5 tabs the morning of chemo, every 3 weeks x 6 40 tablet 1  . lidocaine-prilocaine (EMLA) cream Apply to affected area once 30 g 3  . losartan (COZAAR) 25 MG tablet Take 25 mg by mouth daily.     . naproxen sodium (ANAPROX) 220 MG tablet Take 220-440 mg by mouth 2 (two) times daily as needed (pain).     . ondansetron (ZOFRAN) 8 MG tablet Take 1 tablet (8 mg total) by mouth every 8 (eight) hours as needed for refractory nausea / vomiting. Start on day 3 after chemo. 30 tablet 1  . prochlorperazine (COMPAZINE) 10 MG tablet Take 1 tablet (10 mg total) by mouth every 6 (six) hours as needed (Nausea or vomiting). 30 tablet 1  . traMADol (ULTRAM) 50 MG tablet Take 1 tablet (50 mg total) by mouth every 6 (six) hours as needed for moderate pain. 30 tablet 0  . venlafaxine XR (EFFEXOR-XR) 37.5 MG 24 hr capsule Take 1 capsule (37.5 mg total) daily with breakfast by mouth. 30 capsule 6   No current facility-administered medications for this visit.    Facility-Administered Medications Ordered in Other Visits  Medication Dose Route Frequency Provider Last Rate Last Dose  . CARBOplatin (PARAPLATIN) 590 mg in sodium chloride 0.9 % 250 mL chemo infusion  590 mg Intravenous Once Alvy Bimler, Zaccheaus Storlie, MD      . famotidine (PEPCID) IVPB 20 mg premix  20 mg Intravenous Once Alvy Bimler, Deicy Rusk, MD   20 mg at 03/25/17 0913  . fosaprepitant (EMEND) 150 mg, dexamethasone (DECADRON) 12 mg in sodium chloride 0.9 % 145 mL IVPB   Intravenous Once Alvy Bimler, Akeya Ryther, MD      . heparin lock flush 100 unit/mL  500 Units Intracatheter Once PRN Alvy Bimler, Marina Boerner, MD      . PACLitaxel (TAXOL) 318 mg in sodium chloride 0.9 % 500 mL chemo infusion (> 69m/m2)  175 mg/m2 (Treatment Plan Recorded) Intravenous Once Remmi Armenteros, MD      . sodium chloride flush (NS) 0.9 % injection 10 mL  10 mL Intravenous PRN GAlvy Bimler Mandel Seiden, MD   10 mL at 02/11/17 0839  . sodium chloride flush (NS) 0.9 %  injection 10 mL  10 mL Intravenous PRN GAlvy Bimler Edison Wollschlager, MD   10 mL at 03/04/17 1510  . sodium chloride flush (NS) 0.9 % injection 10 mL  10 mL Intracatheter PRN GAlvy Bimler Aryan Sparks, MD        PHYSICAL EXAMINATION: ECOG PERFORMANCE STATUS: 1 - Symptomatic but completely ambulatory  Vitals:   03/25/17 0824  BP: (!) 152/80  Pulse: 85  Resp: 17  Temp: 97.9 F (36.6 C)  SpO2: 96%   Filed Weights   03/25/17 0824  Weight: 158 lb 11.2 oz (72 kg)    GENERAL:alert, no distress and comfortable SKIN: skin color, texture, turgor are normal, no rashes or significant lesions EYES: normal, Conjunctiva are pink and non-injected, sclera clear OROPHARYNX:no exudate, no erythema and lips, buccal mucosa, and tongue normal  NECK: supple, thyroid normal size, non-tender, without nodularity LYMPH:  no palpable lymphadenopathy in the cervical, axillary or inguinal LUNGS: clear to auscultation and percussion with normal breathing effort HEART: regular rate & rhythm  and no murmurs and no lower extremity edema ABDOMEN:abdomen soft, non-tender and normal bowel sounds Musculoskeletal:no cyanosis of digits and no clubbing  NEURO: alert & oriented x 3 with fluent speech, no focal motor/sensory deficits  LABORATORY DATA:  I have reviewed the data as listed    Component Value Date/Time   NA 138 03/25/2017 0743   K 4.2 03/25/2017 0743   CL 100 (L) 12/27/2016 0503   CO2 22 03/25/2017 0743   GLUCOSE 280 (H) 03/25/2017 0743   BUN 14.7 03/25/2017 0743   CREATININE 0.9 03/25/2017 0743   CALCIUM 9.7 03/25/2017 0743   PROT 7.3 03/25/2017 0743   ALBUMIN 3.7 03/25/2017 0743   AST 17 03/25/2017 0743   ALT 16 03/25/2017 0743   ALKPHOS 72 03/25/2017 0743   BILITOT 0.35 03/25/2017 0743   GFRNONAA >60 12/27/2016 0503   GFRAA >60 12/27/2016 0503    No results found for: SPEP, UPEP  Lab Results  Component Value Date   WBC 6.6 03/25/2017   NEUTROABS 5.6 03/25/2017   HGB 10.8 (L) 03/25/2017   HCT 32.6 (L) 03/25/2017    MCV 91.5 03/25/2017   PLT 302 03/25/2017      Chemistry      Component Value Date/Time   NA 138 03/25/2017 0743   K 4.2 03/25/2017 0743   CL 100 (L) 12/27/2016 0503   CO2 22 03/25/2017 0743   BUN 14.7 03/25/2017 0743   CREATININE 0.9 03/25/2017 0743      Component Value Date/Time   CALCIUM 9.7 03/25/2017 0743   ALKPHOS 72 03/25/2017 0743   AST 17 03/25/2017 0743   ALT 16 03/25/2017 0743   BILITOT 0.35 03/25/2017 0743       ASSESSMENT & PLAN:  Adenocarcinoma of right fallopian tube ([place]) She tolerated treatment very well except for mild peripheral neuropathy and hyperglycemia Recent tumor marker has normalized We will proceed with cycle 4 of treatment without dose adjustment For future cycle of treatment, I plan to reduce the premedication of dexamethasone to 12 mg the night before and 12 mg the morning of treatment We will observe peripheral neuropathy closely and she may need dose adjustment in the future if it gets worse   Drug-induced hyperglycemia This is due to steroids induced hyperglycemia Recommend reduce premedication as above  Peripheral neuropathy due to chemotherapy [facility]) she has mild peripheral neuropathy, likely related to side effects of treatment. It is only mild, not bothering the patient. I will observe for now If it gets worse in the future, I will consider modifying the dose of the treatment    No orders of the defined types were placed in this encounter.  All questions were answered. The patient knows to call the clinic with any problems, questions or concerns. No barriers to learning was detected. I spent 15 minutes counseling the patient face to face. The total time spent in the appointment was 20 minutes and more than 50% was on counseling and review of test results     Heath Lark, MD 03/25/2017 9:15 AM

## 2017-03-25 NOTE — Assessment & Plan Note (Signed)
she has mild peripheral neuropathy, likely related to side effects of treatment. It is only mild, not bothering the patient. I will observe for now If it gets worse in the future, I will consider modifying the dose of the treatment  

## 2017-03-25 NOTE — Assessment & Plan Note (Signed)
This is due to steroids induced hyperglycemia Recommend reduce premedication as above

## 2017-03-25 NOTE — Assessment & Plan Note (Signed)
She tolerated treatment very well except for mild peripheral neuropathy and hyperglycemia Recent tumor marker has normalized We will proceed with cycle 4 of treatment without dose adjustment For future cycle of treatment, I plan to reduce the premedication of dexamethasone to 12 mg the night before and 12 mg the morning of treatment We will observe peripheral neuropathy closely and she may need dose adjustment in the future if it gets worse

## 2017-03-25 NOTE — Patient Instructions (Signed)
[facility] Discharge Instructions for Patients Receiving Chemotherapy  Today you received the following chemotherapy agents taxol/carboplatin  To help prevent nausea and vomiting after your treatment, we encourage you to take your nausea medication as directed   If you develop nausea and vomiting that is not controlled by your nausea medication, call the clinic.   BELOW ARE SYMPTOMS THAT SHOULD BE REPORTED IMMEDIATELY:  *FEVER GREATER THAN 100.5 F  *CHILLS WITH OR WITHOUT FEVER  NAUSEA AND VOMITING THAT IS NOT CONTROLLED WITH YOUR NAUSEA MEDICATION  *UNUSUAL SHORTNESS OF BREATH  *UNUSUAL BRUISING OR BLEEDING  TENDERNESS IN MOUTH AND THROAT WITH OR WITHOUT PRESENCE OF ULCERS  *URINARY PROBLEMS  *BOWEL PROBLEMS  UNUSUAL RASH Items with * indicate a potential emergency and should be followed up as soon as possible.  Feel free to call the clinic you have any questions or concerns. The clinic phone number is [phone].  

## 2017-03-26 LAB — CA 125: CANCER ANTIGEN (CA) 125: 12.6 U/mL (ref 0.0–38.1)

## 2017-04-14 ENCOUNTER — Encounter: Payer: Self-pay | Admitting: Hematology and Oncology

## 2017-04-14 ENCOUNTER — Ambulatory Visit (HOSPITAL_BASED_OUTPATIENT_CLINIC_OR_DEPARTMENT_OTHER): Payer: 59 | Admitting: Hematology and Oncology

## 2017-04-14 ENCOUNTER — Other Ambulatory Visit (HOSPITAL_BASED_OUTPATIENT_CLINIC_OR_DEPARTMENT_OTHER): Payer: 59

## 2017-04-14 ENCOUNTER — Telehealth: Payer: Self-pay | Admitting: Hematology and Oncology

## 2017-04-14 ENCOUNTER — Ambulatory Visit (HOSPITAL_BASED_OUTPATIENT_CLINIC_OR_DEPARTMENT_OTHER): Payer: 59

## 2017-04-14 ENCOUNTER — Ambulatory Visit: Payer: 59

## 2017-04-14 DIAGNOSIS — G620 Drug-induced polyneuropathy: Secondary | ICD-10-CM | POA: Diagnosis not present

## 2017-04-14 DIAGNOSIS — C5701 Malignant neoplasm of right fallopian tube: Secondary | ICD-10-CM

## 2017-04-14 DIAGNOSIS — T50905A Adverse effect of unspecified drugs, medicaments and biological substances, initial encounter: Secondary | ICD-10-CM

## 2017-04-14 DIAGNOSIS — R739 Hyperglycemia, unspecified: Secondary | ICD-10-CM | POA: Diagnosis not present

## 2017-04-14 DIAGNOSIS — N951 Menopausal and female climacteric states: Secondary | ICD-10-CM

## 2017-04-14 DIAGNOSIS — T451X5A Adverse effect of antineoplastic and immunosuppressive drugs, initial encounter: Secondary | ICD-10-CM

## 2017-04-14 DIAGNOSIS — C786 Secondary malignant neoplasm of retroperitoneum and peritoneum: Secondary | ICD-10-CM

## 2017-04-14 DIAGNOSIS — Z5111 Encounter for antineoplastic chemotherapy: Secondary | ICD-10-CM

## 2017-04-14 LAB — CBC WITH DIFFERENTIAL/PLATELET
BASO%: 0.2 % (ref 0.0–2.0)
Basophils Absolute: 0 10*3/uL (ref 0.0–0.1)
EOS ABS: 0 10*3/uL (ref 0.0–0.5)
EOS%: 0.2 % (ref 0.0–7.0)
HEMATOCRIT: 31.3 % — AB (ref 34.8–46.6)
HGB: 10.2 g/dL — ABNORMAL LOW (ref 11.6–15.9)
LYMPH%: 18.1 % (ref 14.0–49.7)
MCH: 30.9 pg (ref 25.1–34.0)
MCHC: 32.6 g/dL (ref 31.5–36.0)
MCV: 94.8 fL (ref 79.5–101.0)
MONO#: 0 10*3/uL — AB (ref 0.1–0.9)
MONO%: 0.7 % (ref 0.0–14.0)
NEUT%: 80.8 % — ABNORMAL HIGH (ref 38.4–76.8)
NEUTROS ABS: 4.4 10*3/uL (ref 1.5–6.5)
NRBC: 0 % (ref 0–0)
PLATELETS: 211 10*3/uL (ref 145–400)
RBC: 3.3 10*6/uL — AB (ref 3.70–5.45)
RDW: 18.2 % — AB (ref 11.2–14.5)
WBC: 5.4 10*3/uL (ref 3.9–10.3)
lymph#: 1 10*3/uL (ref 0.9–3.3)

## 2017-04-14 LAB — COMPREHENSIVE METABOLIC PANEL
ALT: 14 U/L (ref 0–55)
ANION GAP: 13 meq/L — AB (ref 3–11)
AST: 17 U/L (ref 5–34)
Albumin: 3.9 g/dL (ref 3.5–5.0)
Alkaline Phosphatase: 70 U/L (ref 40–150)
BILIRUBIN TOTAL: 0.37 mg/dL (ref 0.20–1.20)
BUN: 13.1 mg/dL (ref 7.0–26.0)
CO2: 21 meq/L — AB (ref 22–29)
CREATININE: 0.9 mg/dL (ref 0.6–1.1)
Calcium: 9.2 mg/dL (ref 8.4–10.4)
Chloride: 105 mEq/L (ref 98–109)
EGFR: >60 mL/min/{1.73_m2} (ref >60)
Glucose: 247 mg/dl — ABNORMAL HIGH (ref 70–140)
Potassium: 4 mEq/L (ref 3.5–5.1)
Sodium: 139 mEq/L (ref 136–145)
TOTAL PROTEIN: 7.3 g/dL (ref 6.4–8.3)

## 2017-04-14 MED ORDER — SODIUM CHLORIDE 0.9 % IV SOLN
131.2500 mg/m2 | Freq: Once | INTRAVENOUS | Status: AC
  Administered 2017-04-14: 240 mg via INTRAVENOUS
  Filled 2017-04-14: qty 40

## 2017-04-14 MED ORDER — FAMOTIDINE IN NACL 20-0.9 MG/50ML-% IV SOLN
20.0000 mg | Freq: Once | INTRAVENOUS | Status: AC
Start: 2017-04-14 — End: 2017-04-14
  Administered 2017-04-14: 20 mg via INTRAVENOUS

## 2017-04-14 MED ORDER — DIPHENHYDRAMINE HCL 50 MG/ML IJ SOLN
25.0000 mg | Freq: Once | INTRAMUSCULAR | Status: AC
  Administered 2017-04-14: 25 mg via INTRAVENOUS

## 2017-04-14 MED ORDER — TRAMADOL HCL 50 MG PO TABS: 50.0000 mg | ORAL_TABLET | Freq: Four times a day (QID) | ORAL | 0 refills | Status: DC | PRN

## 2017-04-14 MED ORDER — HEPARIN SOD (PORK) LOCK FLUSH 100 UNIT/ML IV SOLN
500.0000 [IU] | Freq: Once | INTRAVENOUS | Status: AC | PRN
  Administered 2017-04-14: 500 [IU]
  Filled 2017-04-14: qty 5

## 2017-04-14 MED ORDER — FAMOTIDINE IN NACL 20-0.9 MG/50ML-% IV SOLN
INTRAVENOUS | Status: AC
  Filled 2017-04-14: qty 50

## 2017-04-14 MED ORDER — SODIUM CHLORIDE 0.9 % IV SOLN
540.0000 mg | Freq: Once | INTRAVENOUS | Status: AC
  Administered 2017-04-14: 540 mg via INTRAVENOUS
  Filled 2017-04-14: qty 54

## 2017-04-14 MED ORDER — PROCHLORPERAZINE MALEATE 10 MG PO TABS: 10.0000 mg | ORAL_TABLET | Freq: Four times a day (QID) | ORAL | 1 refills | Status: DC | PRN

## 2017-04-14 MED ORDER — DIPHENHYDRAMINE HCL 50 MG/ML IJ SOLN
INTRAMUSCULAR | Status: AC
  Filled 2017-04-14: qty 1

## 2017-04-14 MED ORDER — PALONOSETRON HCL INJECTION 0.25 MG/5ML
0.2500 mg | Freq: Once | INTRAVENOUS | Status: AC
  Administered 2017-04-14: 0.25 mg via INTRAVENOUS

## 2017-04-14 MED ORDER — SODIUM CHLORIDE 0.9 % IV SOLN
Freq: Once | INTRAVENOUS | Status: AC
  Administered 2017-04-14: 10:00:00 via INTRAVENOUS
  Filled 2017-04-14: qty 5

## 2017-04-14 MED ORDER — PALONOSETRON HCL INJECTION 0.25 MG/5ML
INTRAVENOUS | Status: AC
  Filled 2017-04-14: qty 5

## 2017-04-14 MED ORDER — SODIUM CHLORIDE 0.9% FLUSH
10.0000 mL | Freq: Once | INTRAVENOUS | Status: AC
  Administered 2017-04-14: 10 mL
  Filled 2017-04-14: qty 10

## 2017-04-14 MED ORDER — SODIUM CHLORIDE 0.9% FLUSH
10.0000 mL | INTRAVENOUS | Status: DC | PRN
  Administered 2017-04-14: 10 mL
  Filled 2017-04-14: qty 10

## 2017-04-14 MED ORDER — SODIUM CHLORIDE 0.9 % IV SOLN
Freq: Once | INTRAVENOUS | Status: AC
  Administered 2017-04-14: 09:00:00 via INTRAVENOUS

## 2017-04-14 NOTE — Patient Instructions (Signed)
Implanted Port Home Guide An implanted port is a type of central line that is placed under the skin. Central lines are used to provide IV access when treatment or nutrition needs to be given through a person's veins. Implanted ports are used for long-term IV access. An implanted port may be placed because:  You need IV medicine that would be irritating to the small veins in your hands or arms.  You need long-term IV medicines, such as antibiotics.  You need IV nutrition for a long period.  You need frequent blood draws for lab tests.  You need dialysis.  Implanted ports are usually placed in the chest area, but they can also be placed in the upper arm, the abdomen, or the leg. An implanted port has two main parts:  Reservoir. The reservoir is round and will appear as a small, raised area under your skin. The reservoir is the part where a needle is inserted to give medicines or draw blood.  Catheter. The catheter is a thin, flexible tube that extends from the reservoir. The catheter is placed into a large vein. Medicine that is inserted into the reservoir goes into the catheter and then into the vein.  How will I care for my incision site? Do not get the incision site wet. Bathe or shower as directed by your health care provider. How is my port accessed? Special steps must be taken to access the port:  Before the port is accessed, a numbing cream can be placed on the skin. This helps numb the skin over the port site.  Your health care provider uses a sterile technique to access the port. ? Your health care provider must put on a mask and sterile gloves. ? The skin over your port is cleaned carefully with an antiseptic and allowed to dry. ? The port is gently pinched between sterile gloves, and a needle is inserted into the port.  Only "non-coring" port needles should be used to access the port. Once the port is accessed, a blood return should be checked. This helps ensure that the port  is in the vein and is not clogged.  If your port needs to remain accessed for a constant infusion, a clear (transparent) bandage will be placed over the needle site. The bandage and needle will need to be changed every week, or as directed by your health care provider.  Keep the bandage covering the needle clean and dry. Do not get it wet. Follow your health care provider's instructions on how to take a shower or bath while the port is accessed.  If your port does not need to stay accessed, no bandage is needed over the port.  What is flushing? Flushing helps keep the port from getting clogged. Follow your health care provider's instructions on how and when to flush the port. Ports are usually flushed with saline solution or a medicine called heparin. The need for flushing will depend on how the port is used.  If the port is used for intermittent medicines or blood draws, the port will need to be flushed: ? After medicines have been given. ? After blood has been drawn. ? As part of routine maintenance.  If a constant infusion is running, the port may not need to be flushed.  How long will my port stay implanted? The port can stay in for as long as your health care provider thinks it is needed. When it is time for the port to come out, surgery will be   done to remove it. The procedure is similar to the one performed when the port was put in. When should I seek immediate medical care? When you have an implanted port, you should seek immediate medical care if:  You notice a bad smell coming from the incision site.  You have swelling, redness, or drainage at the incision site.  You have more swelling or pain at the port site or the surrounding area.  You have a fever that is not controlled with medicine.  This information is not intended to replace advice given to you by your health care provider. Make sure you discuss any questions you have with your health care provider. Document  Released: 04/08/2005 Document Revised: 09/14/2015 Document Reviewed: 12/14/2012 Elsevier Interactive Patient Education  2017 [employer]  

## 2017-04-14 NOTE — Patient Instructions (Signed)
[facility] Discharge Instructions for Patients Receiving Chemotherapy  Today you received the following chemotherapy agents taxol/carboplatin  To help prevent nausea and vomiting after your treatment, we encourage you to take your nausea medication as directed   If you develop nausea and vomiting that is not controlled by your nausea medication, call the clinic.   BELOW ARE SYMPTOMS THAT SHOULD BE REPORTED IMMEDIATELY:  *FEVER GREATER THAN 100.5 F  *CHILLS WITH OR WITHOUT FEVER  NAUSEA AND VOMITING THAT IS NOT CONTROLLED WITH YOUR NAUSEA MEDICATION  *UNUSUAL SHORTNESS OF BREATH  *UNUSUAL BRUISING OR BLEEDING  TENDERNESS IN MOUTH AND THROAT WITH OR WITHOUT PRESENCE OF ULCERS  *URINARY PROBLEMS  *BOWEL PROBLEMS  UNUSUAL RASH Items with * indicate a potential emergency and should be followed up as soon as possible.  Feel free to call the clinic you have any questions or concerns. The clinic phone number is [phone].  

## 2017-04-14 NOTE — Telephone Encounter (Signed)
Gave avs and calendar for January  °

## 2017-04-14 NOTE — Assessment & Plan Note (Signed)
This is due to steroids induced hyperglycemia Plan to observe only, reduced dose dex

## 2017-04-14 NOTE — Assessment & Plan Note (Signed)
She tolerated treatment very well except for mild peripheral neuropathy and hyperglycemia Recent tumor marker has normalized We will proceed with cycle 5 of treatment with reduced dose Taxol due to peripheral neuropathy. For future cycle of treatment, I plan to continue on reduced premedication of dexamethasone to 12 mg the night before and 12 mg the morning of treatment

## 2017-04-14 NOTE — Progress Notes (Signed)
[facility] OFFICE PROGRESS NOTE  Patient Care Team: Redmon, Noelle, PA-C as PCP - General (Nurse Practitioner) Elza Varricchio, MD as Consulting Physician (Hematology and Oncology) Rossi, Emma, MD as Consulting Physician (Obstetrics and Gynecology)  SUMMARY OF ONCOLOGIC HISTORY: Oncology History   Neg genetics     Adenocarcinoma of right fallopian tube (HCC)   08/12/2016 Initial Diagnosis    The patient has many months of vague symptoms of fullness in the pelvis, urinary frequency and difficulty with defecation. She denies vaginal bleeding or rectal bleeding.       08/12/2016 Imaging    Abdomen X-ray in the ER: Moderate colonic stool burden without evidence of enteric obstruction      11/13/2016 Imaging    TVUS was performed on 11/13/16 which showed a large hypoechoic lobular mass seen posterior to the uterus measuring 18.2x16.1x11.8cm, blood flow was seen along the periphery of the mass. It was considered to be likely to be a fibroid vs pelvic mass vs ovarian mass. Neither ovary was removed. Moderate amount of free fluid in the pelvis. THe uterus measures 4x10x4cm. The endometrium is 4mm.       12/05/2016 Imaging    MR pelvis 1. Mild limitations as detailed above. 2. Heterogeneous pelvic mass is favored to arise from the right ovary. Favor a solid ovarian neoplasm such as fibroma/Brenner's tumor. Given lesion size and heterogeneous T2 signal out, complicating torsion cannot be excluded. 3. Moderate abdominopelvic ascites, without specific evidence of peritoneal metastasis. Consider further evaluation with contrast-enhanced abdominopelvic CT.       12/26/2016 Pathology Results    1. Uterus, ovaries and fallopian tubes - ADENOCARCINOMA INVOLVING RIGHT FALLOPIAN TUBE, RIGHT AND LEFT OVARIES, UTERINE SEROSA AND PELVIC MASS. - SEE ONCOLOGY TABLE AND COMMENT. - UTERINE CERVIX, ENDOMETRIUM, MYOMETRIUM AND LEFT FALLOPIAN TUBE FREE OF TUMOR. 2. Omentum, resection for tumor -  ADENOCARCINOMA. Microscopic Comment 1. ONCOLOGY TABLE - FALLOPIAN TUBE. SEE COMMENT 1. Specimen, including laterality: Uterus, bilateral adnexa, pelvic mass and omentum. 2. Procedure: Hysterectomy with bilateral salpingo-oophorectomy, pelvic mass excision and omentectomy. 3. Lymph node sampling performed: No 4. Tumor site: See comment. 5. Tumor location in fallopian tube: Right fallopian tube fimbria 6. Specimen integrity (intact/ruptured/disrupted): Intact 7. Tumor size (cm): 18 cm, see comment. 8. Histologic type: Adenocarcinoma 9. Grade: High grade 10. Microscopic tumor extension: Tumor involves right fallopian tube, right and left ovaries, uterine serosa, pelvic mass and omentum. 11. Margins: See comment. 12. Lymph-Vascular invasion: Present 13. Lymph nodes: # examined: 0; # positive: N/A 14. TNM: pT3c, pNX 15. FIGO Stage (based on pathologic findings, needs clinical correlation: III-C 16. Comments: There is an 18 cm pelvic mass which is a high grade adenocarcinoma and there is a 12.2 cm  segment of omentum which is extensively involved with adenocarcinoma. The tumor also involves the fimbria of the right fallopian tube and the parenchyma of the right and left ovaries as well as uterine serosa. The fimbria of the right fallopian has intraepithelial atypia consistent with a precursor lesion and therefore this is most consistent with primary fallopian tube adenocarcinoma. A primary peritoneal serous carcinoma is a less likely possibility.       12/26/2016 Pathology Results    PERITONEAL/ASCITIC FLUID(SPECIMEN 1 OF 1 COLLECTED 12/26/16): POORLY DIFFERENTIATED ADENOCARCINOMA      12/26/2016 Surgery    Procedure(s) Performed: Exploratory laparotomy with total abdominal hysterectomy, bilateral salpingo-oophorectomy, omentectomy radical tumor debulking for ovarian cancer .  Surgeon: Emma C Rossi, MD.   Operative Findings: 20cm left   ovarian mass densely adherent to the posterior uterus,  right tube and ovary, cervix, sigmoid colon. 10cm omental cake. 4L ascites. 26m size tumor nodules on the serosa of the terminal ileum and proximal sigmoid colon. 166mnodules on right diaphragm.    This represented an optimal cytoreduction (R1) with 40m39modules on intestine and diaphragm representing gross visible disease      01/16/2017 Procedure    Placement of single lumen port a cath via right internal jugular vein. The catheter tip lies at the cavo-atrial junction. A power injectable port a cath was placed and is ready for immediate use.      01/17/2017 Imaging    1. 3.5 x 7.2 x 4.6 cm fluid collection along the vaginal cuff, posterior the bladder and extending into the right adnexal space. This lesion demonstrates rim enhancement. Imaging features could be related to a loculated postoperative seroma or hematoma. Superinfection cannot be excluded by CT. Given debulking surgery was 3 weeks ago, this entire structure is un likely to represent neoplasm, but peritoneal involvement could have this appearance. 2. Small fluid collection in the left para colic gutter without rim enhancement. 3. Irregular/nodular appearance of the peritoneal M in the anatomic pelvis with areas of subtle nodularity between the stomach and the spleen. Close attention in these regions on follow-up recommended as metastatic disease is a concern. 4. Mildly enlarged hepatoduodenal ligament lymph node. Attention on follow-up recommended.      01/20/2017 Tumor Marker    Patient's tumor was tested for the following markers: CA-125 Results of the tumor marker test revealed 46.6      01/28/2017 Genetic Testing    Negative genetic [facility].  The [facility] panel offered by [facility] sequencing and deletion/duplication testing of the following 28 genes: APC, ATM, BARD1, BMPR1A, BRCA1, BRCA2, BRIP1, CHD1, CDK4, CDKN2A, CHEK2, EPCAM (large rearrangement only), MLH1, MSH2, MSH6,  MUTYH, NBN, PALB2, PMS2, PTEN, RAD51C, RAD51D, SMAD4, STK11, and TP53. Sequencing was performed for select regions of POLE and POLD1, and large rearrangement analysis was performed for select regions of GREM1. The report date is January 28, 2017.  HRD testing looking for genomic instability and BRCA mutations was negative.  The report date of this test is January 27, 2017.       01/31/2017 Tumor Marker    Patient's tumor was tested for the following markers: CA-125 Results of the tumor marker test revealed 22.4      03/04/2017 Tumor Marker    Patient's tumor was tested for the following markers: CA-125 Results of the tumor marker test revealed 13.8       INTERVAL HISTORY: Please see below for problem oriented charting. She returns prior to cycle 5 of chemotherapy Her peripheral neuropathy appears a little worse and she have difficulties with fine motor activities She denies painful neuropathy No recent infection, fever or chills Appetite is stable, no recent weight loss  REVIEW OF SYSTEMS:   Constitutional: Denies fevers, chills or abnormal weight loss Eyes: Denies blurriness of vision Ears, nose, mouth, throat, and face: Denies mucositis or sore throat Respiratory: Denies cough, dyspnea or wheezes Cardiovascular: Denies palpitation, chest discomfort or lower extremity swelling Gastrointestinal:  Denies nausea, heartburn or change in bowel habits Skin: Denies abnormal skin rashes Lymphatics: Denies new lymphadenopathy or easy bruising Neurological:Denies numbness, tingling or new weaknesses Behavioral/Psych: Mood is stable, no new changes  All other systems were reviewed with the patient and are negative.  I have reviewed  the past medical history, past surgical history, social history and family history with the patient and they are unchanged from previous note.  ALLERGIES:  is allergic to dilaudid [hydromorphone hcl] and sudafed [pseudoephedrine hcl].  MEDICATIONS:  Current  Outpatient Medications  Medication Sig Dispense Refill  . dexamethasone (DECADRON) 4 MG tablet 5 tabs the night before chemo and 5 tabs the morning of chemo, every 3 weeks x 6 40 tablet 1  . lidocaine-prilocaine (EMLA) cream Apply to affected area once 30 g 3  . losartan (COZAAR) 25 MG tablet Take 25 mg by mouth daily.     . naproxen sodium (ANAPROX) 220 MG tablet Take 220-440 mg by mouth 2 (two) times daily as needed (pain).     . ondansetron (ZOFRAN) 8 MG tablet Take 1 tablet (8 mg total) by mouth every 8 (eight) hours as needed for refractory nausea / vomiting. Start on day 3 after chemo. 30 tablet 1  . prochlorperazine (COMPAZINE) 10 MG tablet Take 1 tablet (10 mg total) by mouth every 6 (six) hours as needed (Nausea or vomiting). 60 tablet 1  . traMADol (ULTRAM) 50 MG tablet Take 1 tablet (50 mg total) by mouth every 6 (six) hours as needed for moderate pain. 60 tablet 0  . venlafaxine XR (EFFEXOR-XR) 37.5 MG 24 hr capsule Take 1 capsule (37.5 mg total) daily with breakfast by mouth. 30 capsule 6   No current facility-administered medications for this visit.    Facility-Administered Medications Ordered in Other Visits  Medication Dose Route Frequency Provider Last Rate Last Dose  . 0.9 %  sodium chloride infusion   Intravenous Once Alvy Bimler, Brelynn Wheller, MD      . CARBOplatin (PARAPLATIN) 540 mg in sodium chloride 0.9 % 250 mL chemo infusion  540 mg Intravenous Once Alvy Bimler, Joslyne Marshburn, MD      . heparin lock flush 100 unit/mL  500 Units Intracatheter Once PRN Alvy Bimler, Jacqulynn Shappell, MD      . PACLitaxel (TAXOL) 240 mg in sodium chloride 0.9 % 250 mL chemo infusion (> '80mg'$ /m2)  131.25 mg/m2 (Treatment Plan Recorded) Intravenous Once Lenard Kampf, MD      . sodium chloride flush (NS) 0.9 % injection 10 mL  10 mL Intravenous PRN Alvy Bimler, Miquel Stacks, MD   10 mL at 02/11/17 0839  . sodium chloride flush (NS) 0.9 % injection 10 mL  10 mL Intravenous PRN Alvy Bimler, Shantina Chronister, MD   10 mL at 03/04/17 1510  . sodium chloride flush (NS) 0.9 %  injection 10 mL  10 mL Intracatheter PRN Alvy Bimler, Cherylin Waguespack, MD        PHYSICAL EXAMINATION: ECOG PERFORMANCE STATUS: 1 - Symptomatic but completely ambulatory  Vitals:   04/14/17 0802  BP: (!) 168/84  Pulse: 81  Resp: 14  Temp: 97.8 F (36.6 C)  SpO2: 100%   Filed Weights   04/14/17 0802  Weight: 159 lb 9.6 oz (72.4 kg)    GENERAL:alert, no distress and comfortable SKIN: skin color, texture, turgor are normal, no rashes or significant lesions EYES: normal, Conjunctiva are pink and non-injected, sclera clear OROPHARYNX:no exudate, no erythema and lips, buccal mucosa, and tongue normal  NECK: supple, thyroid normal size, non-tender, without nodularity LYMPH:  no palpable lymphadenopathy in the cervical, axillary or inguinal LUNGS: clear to auscultation and percussion with normal breathing effort HEART: regular rate & rhythm and no murmurs and no lower extremity edema ABDOMEN:abdomen soft, non-tender and normal bowel sounds Musculoskeletal:no cyanosis of digits and no clubbing  NEURO: alert & oriented x  3 with fluent speech, no focal motor/sensory deficits  LABORATORY DATA:  I have reviewed the data as listed    Component Value Date/Time   NA 139 04/14/2017 0742   K 4.0 04/14/2017 0742   CL 100 (L) 12/27/2016 0503   CO2 21 (L) 04/14/2017 0742   GLUCOSE 247 (H) 04/14/2017 0742   BUN 13.1 04/14/2017 0742   CREATININE 0.9 04/14/2017 0742   CALCIUM 9.2 04/14/2017 0742   PROT 7.3 04/14/2017 0742   ALBUMIN 3.9 04/14/2017 0742   AST 17 04/14/2017 0742   ALT 14 04/14/2017 0742   ALKPHOS 70 04/14/2017 0742   BILITOT 0.37 04/14/2017 0742   GFRNONAA >60 12/27/2016 0503   GFRAA >60 12/27/2016 0503    No results found for: SPEP, UPEP  Lab Results  Component Value Date   WBC 5.4 04/14/2017   NEUTROABS 4.4 04/14/2017   HGB 10.2 (L) 04/14/2017   HCT 31.3 (L) 04/14/2017   MCV 94.8 04/14/2017   PLT 211 04/14/2017      Chemistry      Component Value Date/Time   NA 139  04/14/2017 0742   K 4.0 04/14/2017 0742   CL 100 (L) 12/27/2016 0503   CO2 21 (L) 04/14/2017 0742   BUN 13.1 04/14/2017 0742   CREATININE 0.9 04/14/2017 0742      Component Value Date/Time   CALCIUM 9.2 04/14/2017 0742   ALKPHOS 70 04/14/2017 0742   AST 17 04/14/2017 0742   ALT 14 04/14/2017 0742   BILITOT 0.37 04/14/2017 0742       ASSESSMENT & PLAN:  Adenocarcinoma of right fallopian tube ([place]) She tolerated treatment very well except for mild peripheral neuropathy and hyperglycemia Recent tumor marker has normalized We will proceed with cycle 5 of treatment with reduced dose Taxol due to peripheral neuropathy. For future cycle of treatment, I plan to continue on reduced premedication of dexamethasone to 12 mg the night before and 12 mg the morning of treatment  Drug-induced hyperglycemia This is due to steroids induced hyperglycemia Plan to observe only, reduced dose dex  Hot flashes, menopausal She has menopausal hot flashes symptoms, improved on Effexor  Peripheral neuropathy due to chemotherapy [facility]) she has mild peripheral neuropathy, likely related to side effects of treatment. I plan to reduce the dose of treatment as outlined above.  I explained to the patient the rationale of this strategy and reassured the patient it would not compromise the efficacy of treatment    No orders of the defined types were placed in this encounter.  All questions were answered. The patient knows to call the clinic with any problems, questions or concerns. No barriers to learning was detected. I spent 20 minutes counseling the patient face to face. The total time spent in the appointment was 25 minutes and more than 50% was on counseling and review of test results     Heath Lark, MD 04/14/2017 10:29 AM

## 2017-04-14 NOTE — Assessment & Plan Note (Signed)
She has menopausal hot flashes symptoms, improved on Effexor

## 2017-04-14 NOTE — Assessment & Plan Note (Signed)
she has mild peripheral neuropathy, likely related to side effects of treatment. °I plan to reduce the dose of treatment as outlined above.  °I explained to the patient the rationale of this strategy and reassured the patient it would not compromise the efficacy of treatment ° °

## 2017-04-15 LAB — CA 125: Cancer Antigen (CA) 125: 11.9 U/mL (ref 0.0–38.1)

## 2017-04-23 ENCOUNTER — Telehealth: Payer: Self-pay | Admitting: *Deleted

## 2017-04-23 NOTE — Telephone Encounter (Signed)
Pt states she had restarted losartan on 12/25 per Dr Alvy Bimler direction. Has been feeling bad and  light headed. 3 nights ago, she started taking 1/2 pill. BP was  been 128/75, 113/68.   Was still feeling bad so last night she did not take it. Today she has felt much better and BP has been 137/78 and 126/72.  Wanted to make Dr Alvy Bimler aware and how does she want her to proceed?

## 2017-04-24 NOTE — Telephone Encounter (Signed)
Just monitor BP twice a day and show me the numbers next appt Stop taking losartan

## 2017-04-25 ENCOUNTER — Telehealth: Payer: Self-pay | Admitting: *Deleted

## 2017-04-25 NOTE — Telephone Encounter (Signed)
Notified to stop taking losartan. Take BP twice a day and bring results to her next appt with Dr Alvy Bimler

## 2017-05-05 ENCOUNTER — Encounter: Payer: Self-pay | Admitting: Hematology and Oncology

## 2017-05-05 ENCOUNTER — Inpatient Hospital Stay: Payer: 59 | Attending: Hematology and Oncology | Admitting: Hematology and Oncology

## 2017-05-05 ENCOUNTER — Telehealth: Payer: Self-pay | Admitting: Hematology and Oncology

## 2017-05-05 ENCOUNTER — Inpatient Hospital Stay: Payer: 59

## 2017-05-05 VITALS — BP 181/87 | HR 79 | Temp 98.1°F | Resp 18

## 2017-05-05 VITALS — BP 171/81 | HR 82 | Temp 97.8°F | Resp 18 | Ht 71.0 in | Wt 160.9 lb

## 2017-05-05 DIAGNOSIS — R5381 Other malaise: Secondary | ICD-10-CM | POA: Insufficient documentation

## 2017-05-05 DIAGNOSIS — G620 Drug-induced polyneuropathy: Secondary | ICD-10-CM | POA: Diagnosis not present

## 2017-05-05 DIAGNOSIS — R739 Hyperglycemia, unspecified: Secondary | ICD-10-CM

## 2017-05-05 DIAGNOSIS — T50905A Adverse effect of unspecified drugs, medicaments and biological substances, initial encounter: Secondary | ICD-10-CM

## 2017-05-05 DIAGNOSIS — C5701 Malignant neoplasm of right fallopian tube: Secondary | ICD-10-CM

## 2017-05-05 DIAGNOSIS — I10 Essential (primary) hypertension: Secondary | ICD-10-CM | POA: Diagnosis not present

## 2017-05-05 DIAGNOSIS — Z5111 Encounter for antineoplastic chemotherapy: Secondary | ICD-10-CM | POA: Insufficient documentation

## 2017-05-05 DIAGNOSIS — E0965 Drug or chemical induced diabetes mellitus with hyperglycemia: Secondary | ICD-10-CM | POA: Diagnosis not present

## 2017-05-05 DIAGNOSIS — T451X5A Adverse effect of antineoplastic and immunosuppressive drugs, initial encounter: Secondary | ICD-10-CM | POA: Insufficient documentation

## 2017-05-05 LAB — CBC WITH DIFFERENTIAL/PLATELET
Basophils Absolute: 0 10*3/uL (ref 0.0–0.1)
Basophils Relative: 0 %
Eosinophils Absolute: 0 10*3/uL (ref 0.0–0.5)
Eosinophils Relative: 0 %
HCT: 30 % — ABNORMAL LOW (ref 34.8–46.6)
Hemoglobin: 10 g/dL — ABNORMAL LOW (ref 11.6–15.9)
LYMPHS ABS: 0.7 10*3/uL — AB (ref 0.9–3.3)
LYMPHS PCT: 17 %
MCH: 32.5 pg (ref 25.1–34.0)
MCHC: 33.3 g/dL (ref 31.5–36.0)
MCV: 97.5 fL (ref 79.5–101.0)
MONO ABS: 0 10*3/uL — AB (ref 0.1–0.9)
Monocytes Relative: 1 %
NEUTROS ABS: 3.5 10*3/uL (ref 1.5–6.5)
Neutrophils Relative %: 82 %
Platelets: 202 10*3/uL (ref 145–400)
RBC: 3.08 MIL/uL — AB (ref 3.70–5.45)
RDW: 21.2 % — AB (ref 11.2–16.1)
Smear Review: 1
WBC: 4.3 10*3/uL (ref 3.9–10.3)

## 2017-05-05 LAB — COMPREHENSIVE METABOLIC PANEL
ALT: 14 U/L (ref 0–55)
AST: 17 U/L (ref 5–34)
Albumin: 4 g/dL (ref 3.5–5.0)
Alkaline Phosphatase: 77 U/L (ref 40–150)
Anion gap: 13 — ABNORMAL HIGH (ref 3–11)
BUN: 15 mg/dL (ref 7–26)
CO2: 21 mmol/L — ABNORMAL LOW (ref 22–29)
CREATININE: 0.92 mg/dL (ref 0.60–1.10)
Calcium: 9.3 mg/dL (ref 8.4–10.4)
Chloride: 105 mmol/L (ref 98–109)
Glucose, Bld: 279 mg/dL — ABNORMAL HIGH (ref 70–140)
POTASSIUM: 4.1 mmol/L (ref 3.3–4.7)
Sodium: 139 mmol/L (ref 136–145)
TOTAL PROTEIN: 7.2 g/dL (ref 6.4–8.3)
Total Bilirubin: 0.4 mg/dL (ref 0.2–1.2)

## 2017-05-05 MED ORDER — SODIUM CHLORIDE 0.9% FLUSH
10.0000 mL | Freq: Once | INTRAVENOUS | Status: AC
  Administered 2017-05-05: 10 mL
  Filled 2017-05-05: qty 10

## 2017-05-05 MED ORDER — SODIUM CHLORIDE 0.9 % IV SOLN
590.0000 mg | Freq: Once | INTRAVENOUS | Status: AC
  Administered 2017-05-05: 590 mg via INTRAVENOUS
  Filled 2017-05-05: qty 59

## 2017-05-05 MED ORDER — SODIUM CHLORIDE 0.9 % IV SOLN
Freq: Once | INTRAVENOUS | Status: AC
  Administered 2017-05-05: 10:00:00 via INTRAVENOUS
  Filled 2017-05-05: qty 5

## 2017-05-05 MED ORDER — SODIUM CHLORIDE 0.9 % IV SOLN
131.2500 mg/m2 | Freq: Once | INTRAVENOUS | Status: AC
  Administered 2017-05-05: 240 mg via INTRAVENOUS
  Filled 2017-05-05: qty 40

## 2017-05-05 MED ORDER — FAMOTIDINE IN NACL 20-0.9 MG/50ML-% IV SOLN
INTRAVENOUS | Status: AC
  Filled 2017-05-05: qty 50

## 2017-05-05 MED ORDER — SODIUM CHLORIDE 0.9% FLUSH
10.0000 mL | INTRAVENOUS | Status: DC | PRN
  Administered 2017-05-05: 10 mL
  Filled 2017-05-05: qty 10

## 2017-05-05 MED ORDER — PALONOSETRON HCL INJECTION 0.25 MG/5ML
0.2500 mg | Freq: Once | INTRAVENOUS | Status: AC
  Administered 2017-05-05: 0.25 mg via INTRAVENOUS

## 2017-05-05 MED ORDER — DIPHENHYDRAMINE HCL 50 MG/ML IJ SOLN
INTRAMUSCULAR | Status: AC
  Filled 2017-05-05: qty 1

## 2017-05-05 MED ORDER — PALONOSETRON HCL INJECTION 0.25 MG/5ML
INTRAVENOUS | Status: AC
  Filled 2017-05-05: qty 5

## 2017-05-05 MED ORDER — SODIUM CHLORIDE 0.9 % IV SOLN
Freq: Once | INTRAVENOUS | Status: AC
  Administered 2017-05-05: 10:00:00 via INTRAVENOUS

## 2017-05-05 MED ORDER — HEPARIN SOD (PORK) LOCK FLUSH 100 UNIT/ML IV SOLN
500.0000 [IU] | Freq: Once | INTRAVENOUS | Status: AC | PRN
  Administered 2017-05-05: 500 [IU]
  Filled 2017-05-05: qty 5

## 2017-05-05 MED ORDER — FAMOTIDINE IN NACL 20-0.9 MG/50ML-% IV SOLN
20.0000 mg | Freq: Once | INTRAVENOUS | Status: AC
  Administered 2017-05-05: 20 mg via INTRAVENOUS

## 2017-05-05 MED ORDER — DIPHENHYDRAMINE HCL 50 MG/ML IJ SOLN
25.0000 mg | Freq: Once | INTRAMUSCULAR | Status: AC
  Administered 2017-05-05: 25 mg via INTRAVENOUS

## 2017-05-05 NOTE — Assessment & Plan Note (Signed)
We discussed blood pressure management I recommend resumption of Cozaar and to encourage the patient to continue close blood pressure monitoring over the next 3 weeks.

## 2017-05-05 NOTE — Assessment & Plan Note (Signed)
She is very debilitated since chemotherapy I have spent some time writing for application for disability parking placard

## 2017-05-05 NOTE — Patient Instructions (Signed)
[facility] Discharge Instructions for Patients Receiving Chemotherapy  Today you received the following chemotherapy agents:  Taxol, Carboplatin  To help prevent nausea and vomiting after your treatment, we encourage you to take your nausea medication as prescribed.   If you develop nausea and vomiting that is not controlled by your nausea medication, call the clinic.   BELOW ARE SYMPTOMS THAT SHOULD BE REPORTED IMMEDIATELY:  *FEVER GREATER THAN 100.5 F  *CHILLS WITH OR WITHOUT FEVER  NAUSEA AND VOMITING THAT IS NOT CONTROLLED WITH YOUR NAUSEA MEDICATION  *UNUSUAL SHORTNESS OF BREATH  *UNUSUAL BRUISING OR BLEEDING  TENDERNESS IN MOUTH AND THROAT WITH OR WITHOUT PRESENCE OF ULCERS  *URINARY PROBLEMS  *BOWEL PROBLEMS  UNUSUAL RASH Items with * indicate a potential emergency and should be followed up as soon as possible.  Feel free to call the clinic should you have any questions or concerns. The clinic phone number is [phone].  Please show the CHEMO ALERT CARD at check-in to the Emergency Department and triage nurse.   

## 2017-05-05 NOTE — Progress Notes (Signed)
[facility] OFFICE PROGRESS NOTE  Patient Care Team: Redmon, Noelle, PA-C as PCP - General (Nurse Practitioner) Jance Siek, MD as Consulting Physician (Hematology and Oncology) Rossi, Emma, MD as Consulting Physician (Obstetrics and Gynecology)  SUMMARY OF ONCOLOGIC HISTORY: Oncology History   Neg genetics     Adenocarcinoma of right fallopian tube (HCC)   08/12/2016 Initial Diagnosis    The patient has many months of vague symptoms of fullness in the pelvis, urinary frequency and difficulty with defecation. She denies vaginal bleeding or rectal bleeding.       08/12/2016 Imaging    Abdomen X-ray in the ER: Moderate colonic stool burden without evidence of enteric obstruction      11/13/2016 Imaging    TVUS was performed on 11/13/16 which showed a large hypoechoic lobular mass seen posterior to the uterus measuring 18.2x16.1x11.8cm, blood flow was seen along the periphery of the mass. It was considered to be likely to be a fibroid vs pelvic mass vs ovarian mass. Neither ovary was removed. Moderate amount of free fluid in the pelvis. THe uterus measures 4x10x4cm. The endometrium is 4mm.       12/05/2016 Imaging    MR pelvis 1. Mild limitations as detailed above. 2. Heterogeneous pelvic mass is favored to arise from the right ovary. Favor a solid ovarian neoplasm such as fibroma/Brenner's tumor. Given lesion size and heterogeneous T2 signal out, complicating torsion cannot be excluded. 3. Moderate abdominopelvic ascites, without specific evidence of peritoneal metastasis. Consider further evaluation with contrast-enhanced abdominopelvic CT.       12/26/2016 Pathology Results    1. Uterus, ovaries and fallopian tubes - ADENOCARCINOMA INVOLVING RIGHT FALLOPIAN TUBE, RIGHT AND LEFT OVARIES, UTERINE SEROSA AND PELVIC MASS. - SEE ONCOLOGY TABLE AND COMMENT. - UTERINE CERVIX, ENDOMETRIUM, MYOMETRIUM AND LEFT FALLOPIAN TUBE FREE OF TUMOR. 2. Omentum, resection for tumor -  ADENOCARCINOMA. Microscopic Comment 1. ONCOLOGY TABLE - FALLOPIAN TUBE. SEE COMMENT 1. Specimen, including laterality: Uterus, bilateral adnexa, pelvic mass and omentum. 2. Procedure: Hysterectomy with bilateral salpingo-oophorectomy, pelvic mass excision and omentectomy. 3. Lymph node sampling performed: No 4. Tumor site: See comment. 5. Tumor location in fallopian tube: Right fallopian tube fimbria 6. Specimen integrity (intact/ruptured/disrupted): Intact 7. Tumor size (cm): 18 cm, see comment. 8. Histologic type: Adenocarcinoma 9. Grade: High grade 10. Microscopic tumor extension: Tumor involves right fallopian tube, right and left ovaries, uterine serosa, pelvic mass and omentum. 11. Margins: See comment. 12. Lymph-Vascular invasion: Present 13. Lymph nodes: # examined: 0; # positive: N/A 14. TNM: pT3c, pNX 15. FIGO Stage (based on pathologic findings, needs clinical correlation: III-C 16. Comments: There is an 18 cm pelvic mass which is a high grade adenocarcinoma and there is a 12.2 cm  segment of omentum which is extensively involved with adenocarcinoma. The tumor also involves the fimbria of the right fallopian tube and the parenchyma of the right and left ovaries as well as uterine serosa. The fimbria of the right fallopian has intraepithelial atypia consistent with a precursor lesion and therefore this is most consistent with primary fallopian tube adenocarcinoma. A primary peritoneal serous carcinoma is a less likely possibility.       12/26/2016 Pathology Results    PERITONEAL/ASCITIC FLUID(SPECIMEN 1 OF 1 COLLECTED 12/26/16): POORLY DIFFERENTIATED ADENOCARCINOMA      12/26/2016 Surgery    Procedure(s) Performed: Exploratory laparotomy with total abdominal hysterectomy, bilateral salpingo-oophorectomy, omentectomy radical tumor debulking for ovarian cancer .  Surgeon: Emma C Rossi, MD.   Operative Findings: 20cm left   ovarian mass densely adherent to the posterior uterus,  right tube and ovary, cervix, sigmoid colon. 10cm omental cake. 4L ascites. 10m size tumor nodules on the serosa of the terminal ileum and proximal sigmoid colon. 157mnodules on right diaphragm.    This represented an optimal cytoreduction (R1) with 51m27modules on intestine and diaphragm representing gross visible disease      01/16/2017 Procedure    Placement of single lumen port a cath via right internal jugular vein. The catheter tip lies at the cavo-atrial junction. A power injectable port a cath was placed and is ready for immediate use.      01/17/2017 Imaging    1. 3.5 x 7.2 x 4.6 cm fluid collection along the vaginal cuff, posterior the bladder and extending into the right adnexal space. This lesion demonstrates rim enhancement. Imaging features could be related to a loculated postoperative seroma or hematoma. Superinfection cannot be excluded by CT. Given debulking surgery was 3 weeks ago, this entire structure is un likely to represent neoplasm, but peritoneal involvement could have this appearance. 2. Small fluid collection in the left para colic gutter without rim enhancement. 3. Irregular/nodular appearance of the peritoneal M in the anatomic pelvis with areas of subtle nodularity between the stomach and the spleen. Close attention in these regions on follow-up recommended as metastatic disease is a concern. 4. Mildly enlarged hepatoduodenal ligament lymph node. Attention on follow-up recommended.      01/20/2017 Tumor Marker    Patient's tumor was tested for the following markers: CA-125 Results of the tumor marker test revealed 46.6      01/28/2017 Genetic Testing        Negative genetic testing on the [facility].  The [facility] panel offered by [employer] [employer] sequencing and deletion/duplication testing of the following 28 genes: APC, ATM, BARD1, BMPR1A, BRCA1, BRCA2, BRIP1, CHD1, CDK4, CDKN2A, CHEK2, EPCAM (large rearrangement only), MLH1, MSH2,  MSH6, MUTYH, NBN, PALB2, PMS2, PTEN, RAD51C, RAD51D, SMAD4, STK11, and TP53. Sequencing was performed for select regions of POLE and POLD1, and large rearrangement analysis was performed for select regions of GREM1. The report date is January 28, 2017.  HRD testing looking for genomic instability and BRCA mutations was negative.  The report date of this test is January 27, 2017.       01/31/2017 Tumor Marker    Patient's tumor was tested for the following markers: CA-125 Results of the tumor marker test revealed 22.4      03/04/2017 Tumor Marker    Patient's tumor was tested for the following markers: CA-125 Results of the tumor marker test revealed 13.8      04/18/2017 Tumor Marker    Patient's tumor was tested for the following markers: CA-125 Results of the tumor marker test revealed 11.9       INTERVAL HISTORY: Please see below for problem oriented charting. She returns with her husband for cycle 6 of chemotherapy Since last time I saw her, she is doing better She has intermittent hypertension on exam.  However, documentation of her blood pressure twice a day over the past 3 weeks revealed near normal blood pressure in most cases except for sporadic elevated blood pressure in the 140s. She denies worsening peripheral neuropathy Denies recent nausea or vomiting No recent changes in bowel habits, abdominal bloating or constipation. She denies recent infection  REVIEW OF SYSTEMS:   Constitutional: Denies fevers, chills or abnormal weight loss Eyes: Denies blurriness of vision Ears, nose, mouth,  throat, and face: Denies mucositis or sore throat Respiratory: Denies cough, dyspnea or wheezes Cardiovascular: Denies palpitation, chest discomfort or lower extremity swelling Gastrointestinal:  Denies nausea, heartburn or change in bowel habits Skin: Denies abnormal skin rashes Lymphatics: Denies new lymphadenopathy or easy bruising Neurological:Denies numbness, tingling or new  weaknesses Behavioral/Psych: Mood is stable, no new changes  All other systems were reviewed with the patient and are negative.  I have reviewed the past medical history, past surgical history, social history and family history with the patient and they are unchanged from previous note.  ALLERGIES:  is allergic to dilaudid [hydromorphone hcl] and sudafed [pseudoephedrine hcl].  MEDICATIONS:  Current Outpatient Medications  Medication Sig Dispense Refill  . dexamethasone (DECADRON) 4 MG tablet 5 tabs the night before chemo and 5 tabs the morning of chemo, every 3 weeks x 6 40 tablet 1  . lidocaine-prilocaine (EMLA) cream Apply to affected area once 30 g 3  . losartan (COZAAR) 25 MG tablet Take 25 mg by mouth daily.     . naproxen sodium (ANAPROX) 220 MG tablet Take 220-440 mg by mouth 2 (two) times daily as needed (pain).     . ondansetron (ZOFRAN) 8 MG tablet Take 1 tablet (8 mg total) by mouth every 8 (eight) hours as needed for refractory nausea / vomiting. Start on day 3 after chemo. 30 tablet 1  . prochlorperazine (COMPAZINE) 10 MG tablet Take 1 tablet (10 mg total) by mouth every 6 (six) hours as needed (Nausea or vomiting). 60 tablet 1  . traMADol (ULTRAM) 50 MG tablet Take 1 tablet (50 mg total) by mouth every 6 (six) hours as needed for moderate pain. 60 tablet 0  . venlafaxine XR (EFFEXOR-XR) 37.5 MG 24 hr capsule Take 1 capsule (37.5 mg total) daily with breakfast by mouth. 30 capsule 6   No current facility-administered medications for this visit.    Facility-Administered Medications Ordered in Other Visits  Medication Dose Route Frequency Provider Last Rate Last Dose  . CARBOplatin (PARAPLATIN) 590 mg in sodium chloride 0.9 % 250 mL chemo infusion  590 mg Intravenous Once Alvy Bimler, Maika Kaczmarek, MD      . heparin lock flush 100 unit/mL  500 Units Intracatheter Once PRN Alvy Bimler, Arthuro Canelo, MD      . PACLitaxel (TAXOL) 240 mg in sodium chloride 0.9 % 250 mL chemo infusion (> 110m/m2)  131.25 mg/m2  (Treatment Plan Recorded) Intravenous Once GHeath Lark MD 97 mL/hr at 05/05/17 1122 240 mg at 05/05/17 1122  . sodium chloride flush (NS) 0.9 % injection 10 mL  10 mL Intravenous PRN GHeath Lark MD   10 mL at 02/11/17 0839  . sodium chloride flush (NS) 0.9 % injection 10 mL  10 mL Intravenous PRN GAlvy Bimler Valina Maes, MD   10 mL at 03/04/17 1510  . sodium chloride flush (NS) 0.9 % injection 10 mL  10 mL Intracatheter PRN GAlvy Bimler Crissy Mccreadie, MD        PHYSICAL EXAMINATION: ECOG PERFORMANCE STATUS: 1 - Symptomatic but completely ambulatory  Vitals:   05/05/17 0829  BP: (!) 171/81  Pulse: 82  Resp: 18  Temp: 97.8 F (36.6 C)  SpO2: 98%   Filed Weights   05/05/17 0829  Weight: 160 lb 14.4 oz (73 kg)    GENERAL:alert, no distress and comfortable SKIN: skin color, texture, turgor are normal, no rashes or significant lesions EYES: normal, Conjunctiva are pink and non-injected, sclera clear OROPHARYNX:no exudate, no erythema and lips, buccal mucosa, and tongue normal  NECK:  supple, thyroid normal size, non-tender, without nodularity LYMPH:  no palpable lymphadenopathy in the cervical, axillary or inguinal LUNGS: clear to auscultation and percussion with normal breathing effort HEART: regular rate & rhythm and no murmurs and no lower extremity edema ABDOMEN:abdomen soft, non-tender and normal bowel sounds Musculoskeletal:no cyanosis of digits and no clubbing  NEURO: alert & oriented x 3 with fluent speech, no focal motor/sensory deficits  LABORATORY DATA:  I have reviewed the data as listed    Component Value Date/Time   NA 139 05/05/2017 0755   NA 139 04/14/2017 0742   K 4.1 05/05/2017 0755   K 4.0 04/14/2017 0742   CL 105 05/05/2017 0755   CO2 21 (L) 05/05/2017 0755   CO2 21 (L) 04/14/2017 0742   GLUCOSE 279 (H) 05/05/2017 0755   GLUCOSE 247 (H) 04/14/2017 0742   BUN 15 05/05/2017 0755   BUN 13.1 04/14/2017 0742   CREATININE 0.92 05/05/2017 0755   CREATININE 0.9 04/14/2017 0742    CALCIUM 9.3 05/05/2017 0755   CALCIUM 9.2 04/14/2017 0742   PROT 7.2 05/05/2017 0755   PROT 7.3 04/14/2017 0742   ALBUMIN 4.0 05/05/2017 0755   ALBUMIN 3.9 04/14/2017 0742   AST 17 05/05/2017 0755   AST 17 04/14/2017 0742   ALT 14 05/05/2017 0755   ALT 14 04/14/2017 0742   ALKPHOS 77 05/05/2017 0755   ALKPHOS 70 04/14/2017 0742   BILITOT 0.4 05/05/2017 0755   BILITOT 0.37 04/14/2017 0742   GFRNONAA >60 05/05/2017 0755   GFRAA >60 05/05/2017 0755    No results found for: SPEP, UPEP  Lab Results  Component Value Date   WBC 4.3 05/05/2017   NEUTROABS 3.5 05/05/2017   HGB 10.0 (L) 05/05/2017   HCT 30.0 (L) 05/05/2017   MCV 97.5 05/05/2017   PLT 202 05/05/2017      Chemistry      Component Value Date/Time   NA 139 05/05/2017 0755   NA 139 04/14/2017 0742   K 4.1 05/05/2017 0755   K 4.0 04/14/2017 0742   CL 105 05/05/2017 0755   CO2 21 (L) 05/05/2017 0755   CO2 21 (L) 04/14/2017 0742   BUN 15 05/05/2017 0755   BUN 13.1 04/14/2017 0742   CREATININE 0.92 05/05/2017 0755   CREATININE 0.9 04/14/2017 0742      Component Value Date/Time   CALCIUM 9.3 05/05/2017 0755   CALCIUM 9.2 04/14/2017 0742   ALKPHOS 77 05/05/2017 0755   ALKPHOS 70 04/14/2017 0742   AST 17 05/05/2017 0755   AST 17 04/14/2017 0742   ALT 14 05/05/2017 0755   ALT 14 04/14/2017 0742   BILITOT 0.4 05/05/2017 0755   BILITOT 0.37 04/14/2017 0742      ASSESSMENT & PLAN:  Adenocarcinoma of right fallopian tube (HCC) Overall, she tolerated treatment well except for mild peripheral neuropathy and steroid-induced diabetes She will complete cycle 6 of treatment today Afterwards, I plan to repeat CT scan of the abdomen to reassess for residual disease We will have further discussion whether she would benefit from maintenance Avastin versus observation  Peripheral neuropathy due to chemotherapy [facility]) she has mild peripheral neuropathy, likely related to side effects of treatment. I plan to continue on  slight reduced dose of treatment as before  Physical debility She is very debilitated since chemotherapy I have spent some time writing for application for disability parking placard  Drug-induced hyperglycemia This is due to steroids induced hyperglycemia Plan to observe only, reduced dose dex  Essential  hypertension We discussed blood pressure management I recommend resumption of Cozaar and to encourage the patient to continue close blood pressure monitoring over the next 3 weeks.   Orders Placed This Encounter  Procedures  . CT ABDOMEN PELVIS W CONTRAST    Standing Status:   Future    Standing Expiration Date:   05/05/2018    Order Specific Question:   If indicated for the ordered procedure, I authorize the administration of contrast media per Radiology protocol    Answer:   Yes    Order Specific Question:   Preferred imaging location?    Answer:   [facility]    Order Specific Question:   Radiology Contrast Protocol - do NOT remove file path    Answer:   [email or web address]   All questions were answered. The patient knows to call the clinic with any problems, questions or concerns. No barriers to learning was detected. I spent 25 minutes counseling the patient face to face. The total time spent in the appointment was 30 minutes and more than 50% was on counseling and review of test results     Heath Lark, MD 05/05/2017 11:30 AM

## 2017-05-05 NOTE — Telephone Encounter (Signed)
Gave patient AVS and calendar of upcoming February appointments.  °

## 2017-05-05 NOTE — Assessment & Plan Note (Signed)
This is due to steroids induced hyperglycemia Plan to observe only, reduced dose dex

## 2017-05-05 NOTE — Assessment & Plan Note (Signed)
Overall, she tolerated treatment well except for mild peripheral neuropathy and steroid-induced diabetes She will complete cycle 6 of treatment today Afterwards, I plan to repeat CT scan of the abdomen to reassess for residual disease We will have further discussion whether she would benefit from maintenance Avastin versus observation

## 2017-05-05 NOTE — Assessment & Plan Note (Signed)
she has mild peripheral neuropathy, likely related to side effects of treatment. I plan to continue on slight reduced dose of treatment as before

## 2017-05-06 ENCOUNTER — Telehealth: Payer: Self-pay

## 2017-05-06 LAB — CA 125: Cancer Antigen (CA) 125: 12 U/mL (ref 0.0–38.1)

## 2017-05-06 NOTE — Telephone Encounter (Signed)
-----   Message from Heath Lark, MD sent at 05/06/2017  7:18 AM EST ----- Regarding: result of CA-125 PLs let her know it is about the same as last month ----- Message ----- From: Interface, Lab In [place] Sent: 05/05/2017   8:29 AM To: Heath Lark, MD

## 2017-05-06 NOTE — Telephone Encounter (Signed)
Called with below message. 

## 2017-05-09 ENCOUNTER — Telehealth: Payer: Self-pay | Admitting: *Deleted

## 2017-05-09 NOTE — Telephone Encounter (Signed)
Returned patient's phone call regarding BP med that was restarted on Monday by Dr. Alvy Bimler (Cozaar). Patient stated,"I'm feeling weak and lightheaded. I called the after hours nurse last night and they told me to stop the medication. My last BP reading was 112/61."  Per Dr. Alvy Bimler, stop the medication until she sees her on February 5th. Instructed patient that she needs to increase her fluids. She verbalized understanding.

## 2017-05-22 ENCOUNTER — Telehealth: Payer: Self-pay | Admitting: Gynecologic Oncology

## 2017-05-22 NOTE — Telephone Encounter (Signed)
Faxed records to [facility]

## 2017-05-26 ENCOUNTER — Ambulatory Visit (HOSPITAL_COMMUNITY)
Admission: RE | Admit: 2017-05-26 | Discharge: 2017-05-26 | Disposition: A | Discharge status: Home / Self Care | Payer: 59 | Source: Ambulatory Visit | Attending: Hematology and Oncology | Admitting: Hematology and Oncology

## 2017-05-26 ENCOUNTER — Encounter (HOSPITAL_COMMUNITY): Payer: Self-pay

## 2017-05-26 ENCOUNTER — Inpatient Hospital Stay: Payer: 59

## 2017-05-26 ENCOUNTER — Inpatient Hospital Stay: Payer: 59 | Attending: Hematology and Oncology

## 2017-05-26 DIAGNOSIS — C786 Secondary malignant neoplasm of retroperitoneum and peritoneum: Secondary | ICD-10-CM | POA: Insufficient documentation

## 2017-05-26 DIAGNOSIS — C5701 Malignant neoplasm of right fallopian tube: Secondary | ICD-10-CM

## 2017-05-26 DIAGNOSIS — I700 Atherosclerosis of aorta: Secondary | ICD-10-CM | POA: Diagnosis not present

## 2017-05-26 DIAGNOSIS — I10 Essential (primary) hypertension: Secondary | ICD-10-CM | POA: Insufficient documentation

## 2017-05-26 DIAGNOSIS — D61818 Other pancytopenia: Secondary | ICD-10-CM | POA: Insufficient documentation

## 2017-05-26 DIAGNOSIS — G629 Polyneuropathy, unspecified: Secondary | ICD-10-CM | POA: Insufficient documentation

## 2017-05-26 DIAGNOSIS — C7962 Secondary malignant neoplasm of left ovary: Secondary | ICD-10-CM | POA: Insufficient documentation

## 2017-05-26 DIAGNOSIS — C7961 Secondary malignant neoplasm of right ovary: Secondary | ICD-10-CM | POA: Diagnosis not present

## 2017-05-26 DIAGNOSIS — Z9889 Other specified postprocedural states: Secondary | ICD-10-CM | POA: Insufficient documentation

## 2017-05-26 LAB — COMPREHENSIVE METABOLIC PANEL
ALT: 8 U/L (ref 0–55)
ANION GAP: 7 (ref 3–11)
AST: 15 U/L (ref 5–34)
Albumin: 3.8 g/dL (ref 3.5–5.0)
Alkaline Phosphatase: 73 U/L (ref 40–150)
BILIRUBIN TOTAL: 0.4 mg/dL (ref 0.2–1.2)
BUN: 11 mg/dL (ref 7–26)
CO2: 28 mmol/L (ref 22–29)
Calcium: 9.2 mg/dL (ref 8.4–10.4)
Chloride: 106 mmol/L (ref 98–109)
Creatinine, Ser: 0.76 mg/dL (ref 0.60–1.10)
GFR calc Af Amer: >60 mL/min (ref >60)
Glucose, Bld: 93 mg/dL (ref 70–140)
POTASSIUM: 4.2 mmol/L (ref 3.5–5.1)
Sodium: 141 mmol/L (ref 136–145)
TOTAL PROTEIN: 6.8 g/dL (ref 6.4–8.3)

## 2017-05-26 LAB — CBC WITH DIFFERENTIAL/PLATELET
BASOS ABS: 0 10*3/uL (ref 0.0–0.1)
Basophils Relative: 1 %
Eosinophils Absolute: 0 10*3/uL (ref 0.0–0.5)
Eosinophils Relative: 0 %
HEMATOCRIT: 25.5 % — AB (ref 34.8–46.6)
Hemoglobin: 8.2 g/dL — ABNORMAL LOW (ref 11.6–15.9)
LYMPHS PCT: 41 %
Lymphs Abs: 1.5 10*3/uL (ref 0.9–3.3)
MCH: 32.8 pg (ref 25.1–34.0)
MCHC: 32.2 g/dL (ref 31.5–36.0)
MCV: 102 fL — AB (ref 79.5–101.0)
Monocytes Absolute: 0.3 10*3/uL (ref 0.1–0.9)
Monocytes Relative: 8 %
NEUTROS ABS: 1.9 10*3/uL (ref 1.5–6.5)
Neutrophils Relative %: 50 %
PLATELETS: 165 10*3/uL (ref 145–400)
RBC: 2.5 MIL/uL — ABNORMAL LOW (ref 3.70–5.45)
RDW: 17.4 % — ABNORMAL HIGH (ref 11.2–14.5)
WBC: 3.7 10*3/uL — AB (ref 3.9–10.3)

## 2017-05-26 MED ORDER — IOPAMIDOL (ISOVUE-300) INJECTION 61%
INTRAVENOUS | Status: AC
  Administered 2017-05-26: 100 mL via INTRAVENOUS
  Filled 2017-05-26: qty 100

## 2017-05-26 MED ORDER — HEPARIN SOD (PORK) LOCK FLUSH 100 UNIT/ML IV SOLN
500.0000 [IU] | Freq: Once | INTRAVENOUS | Status: AC
  Administered 2017-05-26: 500 [IU] via INTRAVENOUS

## 2017-05-26 MED ORDER — SODIUM CHLORIDE 0.9% FLUSH
10.0000 mL | Freq: Once | INTRAVENOUS | Status: AC
  Administered 2017-05-26: 10 mL
  Filled 2017-05-26: qty 10

## 2017-05-26 MED ORDER — HEPARIN SOD (PORK) LOCK FLUSH 100 UNIT/ML IV SOLN
500.0000 [IU] | Freq: Once | INTRAVENOUS | Status: DC
  Filled 2017-05-26: qty 5

## 2017-05-26 MED ORDER — HEPARIN SOD (PORK) LOCK FLUSH 100 UNIT/ML IV SOLN
INTRAVENOUS | Status: AC
  Filled 2017-05-26: qty 5

## 2017-05-26 MED ORDER — IOPAMIDOL (ISOVUE-300) INJECTION 61%
100.0000 mL | Freq: Once | INTRAVENOUS | Status: AC | PRN
  Administered 2017-05-26: 100 mL via INTRAVENOUS

## 2017-05-27 ENCOUNTER — Inpatient Hospital Stay (HOSPITAL_BASED_OUTPATIENT_CLINIC_OR_DEPARTMENT_OTHER): Payer: 59 | Admitting: Hematology and Oncology

## 2017-05-27 ENCOUNTER — Encounter: Payer: Self-pay | Admitting: Hematology and Oncology

## 2017-05-27 VITALS — BP 143/73 | HR 88 | Temp 98.4°F | Resp 18 | Wt 157.7 lb

## 2017-05-27 DIAGNOSIS — N951 Menopausal and female climacteric states: Secondary | ICD-10-CM | POA: Diagnosis not present

## 2017-05-27 DIAGNOSIS — Z7189 Other specified counseling: Secondary | ICD-10-CM

## 2017-05-27 DIAGNOSIS — G620 Drug-induced polyneuropathy: Secondary | ICD-10-CM | POA: Diagnosis not present

## 2017-05-27 DIAGNOSIS — C5701 Malignant neoplasm of right fallopian tube: Secondary | ICD-10-CM

## 2017-05-27 DIAGNOSIS — I10 Essential (primary) hypertension: Secondary | ICD-10-CM | POA: Diagnosis not present

## 2017-05-27 DIAGNOSIS — D61818 Other pancytopenia: Secondary | ICD-10-CM | POA: Diagnosis not present

## 2017-05-27 DIAGNOSIS — T451X5A Adverse effect of antineoplastic and immunosuppressive drugs, initial encounter: Secondary | ICD-10-CM

## 2017-05-27 LAB — CA 125: Cancer Antigen (CA) 125: 10.6 U/mL (ref 0.0–38.1)

## 2017-05-28 ENCOUNTER — Encounter: Payer: Self-pay | Admitting: Hematology and Oncology

## 2017-05-28 DIAGNOSIS — D61818 Other pancytopenia: Secondary | ICD-10-CM | POA: Insufficient documentation

## 2017-05-28 NOTE — Assessment & Plan Note (Signed)
We discussed healthy living, dietary modification and exercise She is still recovering from side effects of treatment However, I suspect she will be able to return back to work next month  I educated the patient and family the signs and symptoms to watch out for disease recurrence

## 2017-05-28 NOTE — Assessment & Plan Note (Signed)
I have reviewed imaging study and tumor marker with the patient and family She has no evidence of cancer We discussed future follow-up The patient wants port to be removed She would need tumor marker and GYN follow-up in 3 months.  I would alert her surgeon I have not make return appointment for the patient to come back

## 2017-05-28 NOTE — Assessment & Plan Note (Signed)
Her blood pressure control at home is excellent Her elevated blood pressure here is likely due to whitecoat hypertension

## 2017-05-28 NOTE — Assessment & Plan Note (Signed)
She has persistent neuropathy but not worse We discussed cancer rehab program The patient would like to do her own physical exercise and declined rehab

## 2017-05-28 NOTE — Assessment & Plan Note (Signed)
Hot flashes is manageable with Effexor She will continue the same

## 2017-05-28 NOTE — Progress Notes (Signed)
[facility] OFFICE PROGRESS NOTE  Patient Care Team: Redmon, Noelle, PA-C as PCP - General (Nurse Practitioner) Gorsuch, Ni, MD as Consulting Physician (Hematology and Oncology) Rossi, Emma, MD as Consulting Physician (Obstetrics and Gynecology)  SUMMARY OF ONCOLOGIC HISTORY: Oncology History   Neg genetics     Adenocarcinoma of right fallopian tube (HCC)   08/12/2016 Initial Diagnosis    The patient has many months of vague symptoms of fullness in the pelvis, urinary frequency and difficulty with defecation. She denies vaginal bleeding or rectal bleeding.       08/12/2016 Imaging    Abdomen X-ray in the ER: Moderate colonic stool burden without evidence of enteric obstruction      11/13/2016 Imaging    TVUS was performed on 11/13/16 which showed a large hypoechoic lobular mass seen posterior to the uterus measuring 18.2x16.1x11.8cm, blood flow was seen along the periphery of the mass. It was considered to be likely to be a fibroid vs pelvic mass vs ovarian mass. Neither ovary was removed. Moderate amount of free fluid in the pelvis. THe uterus measures 4x10x4cm. The endometrium is 4mm.       12/05/2016 Imaging    MR pelvis 1. Mild limitations as detailed above. 2. Heterogeneous pelvic mass is favored to arise from the right ovary. Favor a solid ovarian neoplasm such as fibroma/Brenner's tumor. Given lesion size and heterogeneous T2 signal out, complicating torsion cannot be excluded. 3. Moderate abdominopelvic ascites, without specific evidence of peritoneal metastasis. Consider further evaluation with contrast-enhanced abdominopelvic CT.       12/26/2016 Pathology Results    1. Uterus, ovaries and fallopian tubes - ADENOCARCINOMA INVOLVING RIGHT FALLOPIAN TUBE, RIGHT AND LEFT OVARIES, UTERINE SEROSA AND PELVIC MASS. - SEE ONCOLOGY TABLE AND COMMENT. - UTERINE CERVIX, ENDOMETRIUM, MYOMETRIUM AND LEFT FALLOPIAN TUBE FREE OF TUMOR. 2. Omentum, resection for tumor -  ADENOCARCINOMA. Microscopic Comment 1. ONCOLOGY TABLE - FALLOPIAN TUBE. SEE COMMENT 1. Specimen, including laterality: Uterus, bilateral adnexa, pelvic mass and omentum. 2. Procedure: Hysterectomy with bilateral salpingo-oophorectomy, pelvic mass excision and omentectomy. 3. Lymph node sampling performed: No 4. Tumor site: See comment. 5. Tumor location in fallopian tube: Right fallopian tube fimbria 6. Specimen integrity (intact/ruptured/disrupted): Intact 7. Tumor size (cm): 18 cm, see comment. 8. Histologic type: Adenocarcinoma 9. Grade: High grade 10. Microscopic tumor extension: Tumor involves right fallopian tube, right and left ovaries, uterine serosa, pelvic mass and omentum. 11. Margins: See comment. 12. Lymph-Vascular invasion: Present 13. Lymph nodes: # examined: 0; # positive: N/A 14. TNM: pT3c, pNX 15. FIGO Stage (based on pathologic findings, needs clinical correlation: III-C 16. Comments: There is an 18 cm pelvic mass which is a high grade adenocarcinoma and there is a 12.2 cm  segment of omentum which is extensively involved with adenocarcinoma. The tumor also involves the fimbria of the right fallopian tube and the parenchyma of the right and left ovaries as well as uterine serosa. The fimbria of the right fallopian has intraepithelial atypia consistent with a precursor lesion and therefore this is most consistent with primary fallopian tube adenocarcinoma. A primary peritoneal serous carcinoma is a less likely possibility.       12/26/2016 Pathology Results    PERITONEAL/ASCITIC FLUID(SPECIMEN 1 OF 1 COLLECTED 12/26/16): POORLY DIFFERENTIATED ADENOCARCINOMA      12/26/2016 Surgery    Procedure(s) Performed: Exploratory laparotomy with total abdominal hysterectomy, bilateral salpingo-oophorectomy, omentectomy radical tumor debulking for ovarian cancer .  Surgeon: Emma C Rossi, MD.   Operative Findings: 20cm left   ovarian mass densely adherent to the posterior uterus,  right tube and ovary, cervix, sigmoid colon. 10cm omental cake. 4L ascites. 45m size tumor nodules on the serosa of the terminal ileum and proximal sigmoid colon. 1772mnodules on right diaphragm.    This represented an optimal cytoreduction (R1) with 72m80modules on intestine and diaphragm representing gross visible disease      01/16/2017 Procedure    Placement of single lumen port a cath via right internal jugular vein. The catheter tip lies at the cavo-atrial junction. A power injectable port a cath was placed and is ready for immediate use.      01/17/2017 Imaging    1. 3.5 x 7.2 x 4.6 cm fluid collection along the vaginal cuff, posterior the bladder and extending into the right adnexal space. This lesion demonstrates rim enhancement. Imaging features could be related to a loculated postoperative seroma or hematoma. Superinfection cannot be excluded by CT. Given debulking surgery was 3 weeks ago, this entire structure is un likely to represent neoplasm, but peritoneal involvement could have this appearance. 2. Small fluid collection in the left para colic gutter without rim enhancement. 3. Irregular/nodular appearance of the peritoneal M in the anatomic pelvis with areas of subtle nodularity between the stomach and the spleen. Close attention in these regions on follow-up recommended as metastatic disease is a concern. 4. Mildly enlarged hepatoduodenal ligament lymph node. Attention on follow-up recommended.      01/20/2017 Tumor Marker    Patient's tumor was tested for the following markers: CA-125 Results of the tumor marker test revealed 46.6      01/28/2017 Genetic Testing        Negative genetic testing on the [facility].  The [facility] panel offered by [employer] [employer] sequencing and deletion/duplication testing of the following 28 genes: APC, ATM, BARD1, BMPR1A, BRCA1, BRCA2, BRIP1, CHD1, CDK4, CDKN2A, CHEK2, EPCAM (large rearrangement only), MLH1, MSH2,  MSH6, MUTYH, NBN, PALB2, PMS2, PTEN, RAD51C, RAD51D, SMAD4, STK11, and TP53. Sequencing was performed for select regions of POLE and POLD1, and large rearrangement analysis was performed for select regions of GREM1. The report date is January 28, 2017.  HRD testing looking for genomic instability and BRCA mutations was negative.  The report date of this test is January 27, 2017.       01/31/2017 Tumor Marker    Patient's tumor was tested for the following markers: CA-125 Results of the tumor marker test revealed 22.4      03/04/2017 Tumor Marker    Patient's tumor was tested for the following markers: CA-125 Results of the tumor marker test revealed 13.8      04/18/2017 Tumor Marker    Patient's tumor was tested for the following markers: CA-125 Results of the tumor marker test revealed 11.9      05/05/2017 Tumor Marker    Patient's tumor was tested for the following markers: CA-125 Results of the tumor marker test revealed 12      05/26/2017 Tumor Marker    Patient's tumor was tested for the following markers: CA-125 Results of the tumor marker test revealed 10.6      05/26/2017 Imaging    1. A previously noted postoperative fluid collection in the low pelvis and residual ascites seen on the prior examination has resolved on today's study. No findings to suggest residual/recurrent disease on today's examination. No definite solid organ metastasis identified in the abdomen or pelvis. No lymphadenopathy. 2. Aortic atherosclerosis.  INTERVAL HISTORY: Please see below for problem oriented charting. She returns with family members to review test results Since the last time I saw her, she complained of fatigue but overall, her energy level is gradually improving Peripheral neuropathy stable She denies recent infection Her blood pressure monitoring at home is excellent  REVIEW OF SYSTEMS:   Constitutional: Denies fevers, chills or abnormal weight loss Eyes: Denies blurriness  of vision Ears, nose, mouth, throat, and face: Denies mucositis or sore throat Respiratory: Denies cough, dyspnea or wheezes Cardiovascular: Denies palpitation, chest discomfort or lower extremity swelling Gastrointestinal:  Denies nausea, heartburn or change in bowel habits Skin: Denies abnormal skin rashes Lymphatics: Denies new lymphadenopathy or easy bruising Neurological:Denies numbness, tingling or new weaknesses Behavioral/Psych: Mood is stable, no new changes  All other systems were reviewed with the patient and are negative.  I have reviewed the past medical history, past surgical history, social history and family history with the patient and they are unchanged from previous note.  ALLERGIES:  is allergic to dilaudid [hydromorphone hcl] and sudafed [pseudoephedrine hcl].  MEDICATIONS:  Current Outpatient Medications  Medication Sig Dispense Refill  . losartan (COZAAR) 25 MG tablet Take 25 mg by mouth daily.     . naproxen sodium (ANAPROX) 220 MG tablet Take 220-440 mg by mouth 2 (two) times daily as needed (pain).     Marland Kitchen venlafaxine XR (EFFEXOR-XR) 37.5 MG 24 hr capsule Take 1 capsule (37.5 mg total) daily with breakfast by mouth. 30 capsule 6   No current facility-administered medications for this visit.    Facility-Administered Medications Ordered in Other Visits  Medication Dose Route Frequency Provider Last Rate Last Dose  . sodium chloride flush (NS) 0.9 % injection 10 mL  10 mL Intravenous PRN Heath Lark, MD   10 mL at 02/11/17 0839  . sodium chloride flush (NS) 0.9 % injection 10 mL  10 mL Intravenous PRN Alvy Bimler, Oluwatimilehin Balfour, MD   10 mL at 03/04/17 1510    PHYSICAL EXAMINATION: ECOG PERFORMANCE STATUS: 1 - Symptomatic but completely ambulatory  Vitals:   05/27/17 1306  BP: (!) 143/73  Pulse: 88  Resp: 18  Temp: 98.4 F (36.9 C)  SpO2: 100%   Filed Weights   05/27/17 1306  Weight: 157 lb 11.2 oz (71.5 kg)    GENERAL:alert, no distress and comfortable SKIN: skin  color, texture, turgor are normal, no rashes or significant lesions EYES: normal, Conjunctiva are pink and non-injected, sclera clear OROPHARYNX:no exudate, no erythema and lips, buccal mucosa, and tongue normal  NECK: supple, thyroid normal size, non-tender, without nodularity LYMPH:  no palpable lymphadenopathy in the cervical, axillary or inguinal LUNGS: clear to auscultation and percussion with normal breathing effort HEART: regular rate & rhythm and no murmurs and no lower extremity edema ABDOMEN:abdomen soft, non-tender and normal bowel sounds Musculoskeletal:no cyanosis of digits and no clubbing  NEURO: alert & oriented x 3 with fluent speech, no focal motor/sensory deficits  LABORATORY DATA:  I have reviewed the data as listed    Component Value Date/Time   NA 141 05/26/2017 0818   NA 139 04/14/2017 0742   K 4.2 05/26/2017 0818   K 4.0 04/14/2017 0742   CL 106 05/26/2017 0818   CO2 28 05/26/2017 0818   CO2 21 (L) 04/14/2017 0742   GLUCOSE 93 05/26/2017 0818   GLUCOSE 247 (H) 04/14/2017 0742   BUN 11 05/26/2017 0818   BUN 13.1 04/14/2017 0742   CREATININE 0.76 05/26/2017 0818   CREATININE 0.9  04/14/2017 0742   CALCIUM 9.2 05/26/2017 0818   CALCIUM 9.2 04/14/2017 0742   PROT 6.8 05/26/2017 0818   PROT 7.3 04/14/2017 0742   ALBUMIN 3.8 05/26/2017 0818   ALBUMIN 3.9 04/14/2017 0742   AST 15 05/26/2017 0818   AST 17 04/14/2017 0742   ALT 8 05/26/2017 0818   ALT 14 04/14/2017 0742   ALKPHOS 73 05/26/2017 0818   ALKPHOS 70 04/14/2017 0742   BILITOT 0.4 05/26/2017 0818   BILITOT 0.37 04/14/2017 0742   GFRNONAA >60 05/26/2017 0818   GFRAA >60 05/26/2017 0818    No results found for: SPEP, UPEP  Lab Results  Component Value Date   WBC 3.7 (L) 05/26/2017   NEUTROABS 1.9 05/26/2017   HGB 8.2 (L) 05/26/2017   HCT 25.5 (L) 05/26/2017   MCV 102.0 (H) 05/26/2017   PLT 165 05/26/2017      Chemistry      Component Value Date/Time   NA 141 05/26/2017 0818   NA 139  04/14/2017 0742   K 4.2 05/26/2017 0818   K 4.0 04/14/2017 0742   CL 106 05/26/2017 0818   CO2 28 05/26/2017 0818   CO2 21 (L) 04/14/2017 0742   BUN 11 05/26/2017 0818   BUN 13.1 04/14/2017 0742   CREATININE 0.76 05/26/2017 0818   CREATININE 0.9 04/14/2017 0742      Component Value Date/Time   CALCIUM 9.2 05/26/2017 0818   CALCIUM 9.2 04/14/2017 0742   ALKPHOS 73 05/26/2017 0818   ALKPHOS 70 04/14/2017 0742   AST 15 05/26/2017 0818   AST 17 04/14/2017 0742   ALT 8 05/26/2017 0818   ALT 14 04/14/2017 0742   BILITOT 0.4 05/26/2017 0818   BILITOT 0.37 04/14/2017 0742       RADIOGRAPHIC STUDIES: I have reviewed imaging study with the patient and family I have personally reviewed the radiological images as listed and agreed with the findings in the report. Ct Abdomen Pelvis W Contrast  Result Date: 05/26/2017 CLINICAL DATA:  61-year-old female with history of adenocarcinoma of the right fallopian tube with metastatic disease to the peritoneum. Follow-up study. Chemotherapy now complete. EXAM: CT ABDOMEN AND PELVIS WITH CONTRAST TECHNIQUE: Multidetector CT imaging of the abdomen and pelvis was performed using the standard protocol following bolus administration of intravenous contrast. CONTRAST:  100 mL of Isovue 300. COMPARISON:  CT of the chest, abdomen and pelvis 01/17/2017. FINDINGS: Lower chest: Small hiatal hernia. Hepatobiliary: No suspicious cystic or solid hepatic lesions. No intra or extrahepatic biliary ductal dilatation. Gallbladder is normal in appearance. Pancreas: No pancreatic mass. No pancreatic ductal dilatation. No pancreatic or peripancreatic fluid or inflammatory changes. Spleen: Unremarkable. Adrenals/Urinary Tract: Bilateral kidneys and bilateral adrenal glands are normal in appearance. No hydroureteronephrosis. Urinary bladder is normal in appearance. Stomach/Bowel: Normal appearance of the stomach. No pathologic dilatation of small bowel or colon. Normal appendix.  Vascular/Lymphatic: Aortic atherosclerosis, without evidence of aneurysm or dissection noted in the abdominal or pelvic vasculature. No lymphadenopathy noted in the abdomen or pelvis. Reproductive: Status posthysterectomy. Ovaries are not confidently identified may be surgically absent or atrophic. Other: No significant volume of ascites.  No pneumoperitoneum. Musculoskeletal: Healed anterior abdominal surgical wound. There are no aggressive appearing lytic or blastic lesions noted in the visualized portions of the skeleton. IMPRESSION: 1. A previously noted postoperative fluid collection in the low pelvis and residual ascites seen on the prior examination has resolved on today's study. No findings to suggest residual/recurrent disease on today's examination. No definite solid   organ metastasis identified in the abdomen or pelvis. No lymphadenopathy. 2. Aortic atherosclerosis. Electronically Signed   By: Daniel  Entrikin M.D.   On: 05/26/2017 14:47    ASSESSMENT & PLAN:  Adenocarcinoma of right fallopian tube (HCC) I have reviewed imaging study and tumor marker with the patient and family She has no evidence of cancer We discussed future follow-up The patient wants port to be removed She would need tumor marker and GYN follow-up in 3 months.  I would alert her surgeon I have not make return appointment for the patient to come back  Peripheral neuropathy due to chemotherapy (HCC) She has persistent neuropathy but not worse We discussed cancer rehab program The patient would like to do her own physical exercise and declined rehab  Essential hypertension Her blood pressure control at home is excellent Her elevated blood pressure here is likely due to whitecoat hypertension  Hot flashes, menopausal Hot flashes is manageable with Effexor She will continue the same  Pancytopenia, acquired (HCC) She is not symptomatic from pancytopenia I suspect it would recover in a few weeks She does not need  follow-up for this  Goals of care, counseling/discussion We discussed healthy living, dietary modification and exercise She is still recovering from side effects of treatment However, I suspect she will be able to return back to work next month  I educated the patient and family the signs and symptoms to watch out for disease recurrence    Orders Placed This Encounter  Procedures  . IR Removal Tun Access W/ Port W/O FL    Standing Status:   Future    Standing Expiration Date:   07/26/2018    Order Specific Question:   Reason for exam:    Answer:   no need chemo, treatment completed    Order Specific Question:   Preferred Imaging Location?    Answer:   [facility]   All questions were answered. The patient knows to call the clinic with any problems, questions or concerns. No barriers to learning was detected. I spent 25 minutes counseling the patient face to face. The total time spent in the appointment was 40 minutes and more than 50% was on counseling and review of test results     Ni Gorsuch, MD 05/28/2017 9:28 AM  

## 2017-05-28 NOTE — Assessment & Plan Note (Signed)
She is not symptomatic from pancytopenia I suspect it would recover in a few weeks She does not need follow-up for this

## 2017-05-30 ENCOUNTER — Telehealth: Payer: Self-pay | Admitting: *Deleted

## 2017-05-30 NOTE — Telephone Encounter (Signed)
Per staff message from Dr. Denman George and Dr. Alvy Bimler I have scheduled an appt for May 15th at 1:15pm.

## 2017-06-04 ENCOUNTER — Telehealth: Payer: Self-pay | Admitting: Hematology and Oncology

## 2017-06-04 NOTE — Telephone Encounter (Signed)
06/04/17 @ 8:44 am spoke with patient to inform her that her Disability form (Attending Physician Statement) was ready for pick up and would be ready for her at the front desk reception area.

## 2017-06-09 ENCOUNTER — Other Ambulatory Visit: Payer: Self-pay | Admitting: Radiology

## 2017-06-10 ENCOUNTER — Ambulatory Visit (HOSPITAL_COMMUNITY)
Admission: RE | Admit: 2017-06-10 | Discharge: 2017-06-10 | Disposition: A | Discharge status: Home / Self Care | Payer: 59 | Source: Ambulatory Visit | Attending: Hematology and Oncology | Admitting: Hematology and Oncology

## 2017-06-10 ENCOUNTER — Encounter (HOSPITAL_COMMUNITY): Payer: Self-pay

## 2017-06-10 DIAGNOSIS — Z8589 Personal history of malignant neoplasm of other organs and systems: Secondary | ICD-10-CM | POA: Insufficient documentation

## 2017-06-10 DIAGNOSIS — Z79899 Other long term (current) drug therapy: Secondary | ICD-10-CM | POA: Insufficient documentation

## 2017-06-10 DIAGNOSIS — Z8543 Personal history of malignant neoplasm of ovary: Secondary | ICD-10-CM | POA: Diagnosis not present

## 2017-06-10 DIAGNOSIS — Z452 Encounter for adjustment and management of vascular access device: Secondary | ICD-10-CM | POA: Diagnosis not present

## 2017-06-10 DIAGNOSIS — I10 Essential (primary) hypertension: Secondary | ICD-10-CM | POA: Insufficient documentation

## 2017-06-10 DIAGNOSIS — Z791 Long term (current) use of non-steroidal anti-inflammatories (NSAID): Secondary | ICD-10-CM | POA: Diagnosis not present

## 2017-06-10 DIAGNOSIS — C5701 Malignant neoplasm of right fallopian tube: Secondary | ICD-10-CM

## 2017-06-10 DIAGNOSIS — Z9221 Personal history of antineoplastic chemotherapy: Secondary | ICD-10-CM | POA: Diagnosis not present

## 2017-06-10 HISTORY — DX: Personal history of antineoplastic chemotherapy: Z92.21

## 2017-06-10 HISTORY — PX: IR REMOVAL TUN ACCESS W/ PORT W/O FL MOD SED: IMG2290

## 2017-06-10 LAB — CBC WITH DIFFERENTIAL/PLATELET
BASOS PCT: 1 %
Basophils Absolute: 0 10*3/uL (ref 0.0–0.1)
EOS ABS: 0.2 10*3/uL (ref 0.0–0.7)
EOS PCT: 5 %
HCT: 33.5 % — ABNORMAL LOW (ref 36.0–46.0)
HEMOGLOBIN: 10.8 g/dL — AB (ref 12.0–15.0)
Lymphocytes Relative: 41 %
Lymphs Abs: 1.7 10*3/uL (ref 0.7–4.0)
MCH: 33.4 pg (ref 26.0–34.0)
MCHC: 32.2 g/dL (ref 30.0–36.0)
MCV: 103.7 fL — ABNORMAL HIGH (ref 78.0–100.0)
Monocytes Absolute: 0.3 10*3/uL (ref 0.1–1.0)
Monocytes Relative: 7 %
NEUTROS PCT: 46 %
Neutro Abs: 2 10*3/uL (ref 1.7–7.7)
PLATELETS: 252 10*3/uL (ref 150–400)
RBC: 3.23 MIL/uL — AB (ref 3.87–5.11)
RDW: 16 % — ABNORMAL HIGH (ref 11.5–15.5)
WBC: 4.2 10*3/uL (ref 4.0–10.5)

## 2017-06-10 LAB — PROTIME-INR
INR: 0.99
PROTHROMBIN TIME: 13 s (ref 11.4–15.2)

## 2017-06-10 MED ORDER — CEFAZOLIN SODIUM-DEXTROSE 2-4 GM/100ML-% IV SOLN
2.0000 g | INTRAVENOUS | Status: AC
  Administered 2017-06-10: 2 g via INTRAVENOUS

## 2017-06-10 MED ORDER — LIDOCAINE-EPINEPHRINE (PF) 2 %-1:200000 IJ SOLN
INTRAMUSCULAR | Status: AC
  Filled 2017-06-10: qty 20

## 2017-06-10 MED ORDER — FENTANYL CITRATE (PF) 100 MCG/2ML IJ SOLN
INTRAMUSCULAR | Status: AC | PRN
  Administered 2017-06-10 (×2): 50 ug via INTRAVENOUS

## 2017-06-10 MED ORDER — MIDAZOLAM HCL 2 MG/2ML IJ SOLN
INTRAMUSCULAR | Status: AC | PRN
  Administered 2017-06-10 (×2): 1 mg via INTRAVENOUS

## 2017-06-10 MED ORDER — FENTANYL CITRATE (PF) 100 MCG/2ML IJ SOLN
INTRAMUSCULAR | Status: AC
  Filled 2017-06-10: qty 2

## 2017-06-10 MED ORDER — CEFAZOLIN SODIUM-DEXTROSE 2-4 GM/100ML-% IV SOLN
INTRAVENOUS | Status: AC
  Administered 2017-06-10: 2 g via INTRAVENOUS
  Filled 2017-06-10: qty 100

## 2017-06-10 MED ORDER — SODIUM CHLORIDE 0.9 % IV SOLN
INTRAVENOUS | Status: DC
  Administered 2017-06-10: 10:00:00 via INTRAVENOUS

## 2017-06-10 MED ORDER — MIDAZOLAM HCL 2 MG/2ML IJ SOLN
INTRAMUSCULAR | Status: AC
  Filled 2017-06-10: qty 2

## 2017-06-10 MED ORDER — LIDOCAINE-EPINEPHRINE (PF) 2 %-1:200000 IJ SOLN
INTRAMUSCULAR | Status: AC | PRN
  Administered 2017-06-10: 20 mL

## 2017-06-10 NOTE — H&P (Signed)
Referring Physician(s): Heath Lark  Supervising Physician: Daryll Brod  Patient Status:  [name]  Chief Complaint:  "I'm here to get my port out"  Subjective: Patient familiar to IR service from prior Port-A-Cath placement on 01/16/17.  She has a history of fallopian tube carcinoma, status post surgery and chemotherapy.  Recent imaging reveals no evidence of residual or recurrent disease.  She presents again today for Port-A-Cath removal. She currently denies fever, headache, chest pain, dyspnea, cough, abdominal/back pain, nausea, vomiting or bleeding. Past Medical History:  Diagnosis Date  . Complication of anesthesia    slow to wake up sometimes   . GERD (gastroesophageal reflux disease)   . History of chemotherapy   . History of hiatal hernia   . Hypertension   . Ovarian cancer ([place])   . PONV (postoperative nausea and vomiting)    Past Surgical History:  Procedure Laterality Date  . DEBULKING N/A 12/26/2016   Procedure: RADICAL TUMOR DEBULKING; OMENTECTOMY;  Surgeon: Everitt Amber, MD;  Location: [facility];  Service: Gynecology;  Laterality: N/A;  . ganglion cyst removed    . Hital Hernia    . IR FLUORO GUIDE PORT INSERTION RIGHT  01/16/2017  . IR US GUIDE VASC ACCESS RIGHT  01/16/2017  . LAPAROTOMY N/A 12/26/2016   Procedure: EXPLORATORY LAPAROTOMY;  Surgeon: Everitt Amber, MD;  Location: [facility];  Service: Gynecology;  Laterality: N/A;  . left morton's neuroma surgery     . Nissan Fundoplication       Allergies: Dilaudid [hydromorphone hcl] and Sudafed [pseudoephedrine hcl]  Medications: Prior to Admission medications   Medication Sig Start Date End Date Taking? Authorizing Provider  naproxen sodium (ANAPROX) 220 MG tablet Take 220-440 mg by mouth 2 (two) times daily as needed (pain).    Yes [provider]  venlafaxine XR (EFFEXOR-XR) 37.5 MG 24 hr capsule Take 1 capsule (37.5 mg total) daily with breakfast by mouth. 03/05/17  Yes Cross, Melissa D, NP  losartan  (COZAAR) 25 MG tablet Take 25 mg by mouth daily.     [provider]     Vital Signs: BP (!) 158/83 (BP Location: Right Arm)   Pulse 71   Temp 98 F (36.7 C) (Oral)   Resp 16   SpO2 98%   Physical Exam awake, alert.  Chest clear to auscultation bilaterally.  Clean, intact right chest wall Port-A-Cath.  Heart with regular rate and rhythm.  Abdomen soft, positive bowel sounds, nontender.  No lower extremity edema.  Imaging: No results found.  Labs:  CBC: Recent Labs    03/25/17 0743 04/14/17 0742 05/05/17 0755 05/26/17 0818  WBC 6.6 5.4 4.3 3.7*  HGB 10.8* 10.2* 10.0* 8.2*  HCT 32.6* 31.3* 30.0* 25.5*  PLT 302 211 202 165    COAGS: Recent Labs    01/16/17 0758  INR 1.09    BMP: Recent Labs    12/19/16 0921 12/27/16 0503  03/25/17 0743 04/14/17 0742 05/05/17 0755 05/26/17 0818  NA 138 135   < > 138 139 139 141  K 4.9 4.6   < > 4.2 4.0 4.1 4.2  CL 104 100*  --   --   --  105 106  CO2 26 25   < > 22 21* 21* 28  GLUCOSE 104* 175*   < > 280* 247* 279* 93  BUN 10 9   < > 14.7 13.1 15 11   CALCIUM 9.4 8.7*   < > 9.7 9.2 9.3 9.2  CREATININE 0.92  0.74   < > 0.9 0.9 0.92 0.76  GFRNONAA >60 >60  --   --   --  >60 >60  GFRAA >60 >60  --   --   --  >60 >60   < > = values in this interval not displayed.    LIVER FUNCTION TESTS: Recent Labs    03/25/17 0743 04/14/17 0742 05/05/17 0755 05/26/17 0818  BILITOT 0.35 0.37 0.4 0.4  AST 17 17 17 15   ALT 16 14 14 8   ALKPHOS 72 70 77 73  PROT 7.3 7.3 7.2 6.8  ALBUMIN 3.7 3.9 4.0 3.8    Assessment and Plan: Pt with history of fallopian tube carcinoma, status post surgery and chemotherapy.  Recent imaging reveals no evidence of residual or recurrent disease.  She presents  today for Port-A-Cath removal.  Details/risks of procedure, including but not limited to, internal bleeding, infection, injury to adjacent structures discussed with patient with her understanding and consent.   Labs  pending  Electronically Signed: D. Rowe Robert, PA-C 06/10/2017, 9:51 AM   I spent a total of 20 minutes at the the patient's bedside AND on the patient's hospital floor or unit, greater than 50% of which was counseling/coordinating care for Port-A-Cath removal

## 2017-06-10 NOTE — Procedures (Signed)
Fallopian tube adenoca  S/p port removal  No comp Stable ebl 0 Full report in pacs

## 2017-06-10 NOTE — Discharge Instructions (Signed)
Wound Care, Adult Taking care of your wound properly can help to prevent pain and infection. It can also help your wound to heal more quickly. How is this treated? Wound care  Follow instructions from your health care provider about how to take care of your wound. Make sure you: ? Wash your hands with soap and water before you change the bandage (dressing). If soap and water are not available, use hand sanitizer. ? Change your dressing as told by your health care provider. ? Leave stitches (sutures), skin glue, or adhesive strips in place. These skin closures may need to stay in place for 2 weeks or longer. If adhesive strip edges start to loosen and curl up, you may trim the loose edges. Do not remove adhesive strips completely unless your health care provider tells you to do that.  Check your wound area every day for signs of infection. Check for: ? More redness, swelling, or pain. ? More fluid or blood. ? Warmth. ? Pus or a bad smell.  Ask your health care provider if you should clean the wound with mild soap and water. Doing this may include: ? Using a clean towel to pat the wound dry after cleaning it. Do not rub or scrub the wound. ? Applying a cream or ointment. Do this only as told by your health care provider. ? Covering the incision with a clean dressing.  Ask your health care provider when you can leave the wound uncovered. Medicines   If you were prescribed an antibiotic medicine, cream, or ointment, take or use the antibiotic as told by your health care provider. Do not stop taking or using the antibiotic even if your condition improves.  Take over-the-counter and prescription medicines only as told by your health care provider. If you were prescribed pain medicine, take it at least 30 minutes before doing any wound care or as told by your health care provider. General instructions  Return to your normal activities as told by your health care provider. Ask your health care  provider what activities are safe.  Do not scratch or pick at the wound.  Keep all follow-up visits as told by your health care provider. This is important.  Eat a diet that includes protein, vitamin A, vitamin C, and other nutrient-rich foods. These help the wound heal: ? Protein-rich foods include meat, dairy, beans, nuts, and other sources. ? Vitamin A-rich foods include carrots and dark green, leafy vegetables. ? Vitamin C-rich foods include citrus, tomatoes, and other fruits and vegetables. ? Nutrient-rich foods have protein, carbohydrates, fat, vitamins, or minerals. Eat a variety of healthy foods including vegetables, fruits, and whole grains. Contact a health care provider if:  You received a tetanus shot and you have swelling, severe pain, redness, or bleeding at the injection site.  Your pain is not controlled with medicine.  You have more redness, swelling, or pain around the wound.  You have more fluid or blood coming from the wound.  Your wound feels warm to the touch.  You have pus or a bad smell coming from the wound.  You have a fever or chills.  You are nauseous or you vomit.  You are dizzy. Get help right away if:  You have a red streak going away from your wound.  The edges of the wound open up and separate.  Your wound is bleeding and the bleeding does not stop with gentle pressure.  You have a rash.  You faint.  You have trouble  breathing. This information is not intended to replace advice given to you by your health care provider. Make sure you discuss any questions you have with your health care provider. Document Released: 01/16/2008 Document Revised: 12/06/2015 Document Reviewed: 10/24/2015 Elsevier Interactive Patient Education  2017 [employer]. Moderate Conscious Sedation, Adult, Care After These instructions provide you with information about caring for yourself after your procedure. Your health care provider may also give you more  specific instructions. Your treatment has been planned according to current medical practices, but problems sometimes occur. Call your health care provider if you have any problems or questions after your procedure. What can I expect after the procedure? After your procedure, it is common:  To feel sleepy for several hours.  To feel clumsy and have poor balance for several hours.  To have poor judgment for several hours.  To vomit if you eat too soon.  Follow these instructions at home: For at least 24 hours after the procedure:   Do not: ? Participate in activities where you could fall or become injured. ? Drive. ? Use heavy machinery. ? Drink alcohol. ? Take sleeping pills or medicines that cause drowsiness. ? Make important decisions or sign legal documents. ? Take care of children on your own.  Rest. Eating and drinking  Follow the diet recommended by your health care provider.  If you vomit: ? Drink water, juice, or soup when you can drink without vomiting. ? Make sure you have little or no nausea before eating solid foods. General instructions  Have a responsible adult stay with you until you are awake and alert.  Take over-the-counter and prescription medicines only as told by your health care provider.  If you smoke, do not smoke without supervision.  Keep all follow-up visits as told by your health care provider. This is important. Contact a health care provider if:  You keep feeling nauseous or you keep vomiting.  You feel light-headed.  You develop a rash.  You have a fever. Get help right away if:  You have trouble breathing. This information is not intended to replace advice given to you by your health care provider. Make sure you discuss any questions you have with your health care provider. Document Released: 01/27/2013 Document Revised: 09/11/2015 Document Reviewed: 07/29/2015 Elsevier Interactive Patient Education  [employer].

## 2017-08-25 ENCOUNTER — Telehealth: Payer: Self-pay | Admitting: *Deleted

## 2017-08-25 NOTE — Telephone Encounter (Signed)
Patient called back and moved her appt to Wednesday May 8th

## 2017-08-25 NOTE — Telephone Encounter (Signed)
Called and moved the patient's appt from May 15th to May 10th 

## 2017-08-27 ENCOUNTER — Encounter: Payer: Self-pay | Admitting: Gynecologic Oncology

## 2017-08-27 ENCOUNTER — Inpatient Hospital Stay: Payer: 59

## 2017-08-27 ENCOUNTER — Inpatient Hospital Stay: Payer: 59 | Attending: Hematology and Oncology | Admitting: Gynecologic Oncology

## 2017-08-27 VITALS — BP 138/85 | HR 71 | Temp 98.7°F | Resp 20 | Ht 65.0 in | Wt 163.8 lb

## 2017-08-27 DIAGNOSIS — Z9221 Personal history of antineoplastic chemotherapy: Secondary | ICD-10-CM | POA: Diagnosis not present

## 2017-08-27 DIAGNOSIS — Z90722 Acquired absence of ovaries, bilateral: Secondary | ICD-10-CM | POA: Insufficient documentation

## 2017-08-27 DIAGNOSIS — Z8543 Personal history of malignant neoplasm of ovary: Secondary | ICD-10-CM | POA: Diagnosis not present

## 2017-08-27 DIAGNOSIS — C569 Malignant neoplasm of unspecified ovary: Secondary | ICD-10-CM

## 2017-08-27 DIAGNOSIS — Z90710 Acquired absence of both cervix and uterus: Secondary | ICD-10-CM | POA: Diagnosis not present

## 2017-08-27 NOTE — Progress Notes (Signed)
Follow-up Note: Gyn-Onc  Consult was requested by Dr. Simona Huh for the evaluation of Holly [name] 62 y.o. female  CC:  Chief Complaint  Patient presents with  . Malignant neoplasm of ovary, unspecified laterality [facility])    Assessment/Plan:  Holly [name]  is a 62 y.o.  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 S/p completion of 6 cycles adjuvant IV carb/taxol in January, 2019. BRCA negative.  Complete clinical response.  Check CA 125 today. Re-evaluate for recurrence in 3 months with labs.   HPI: Holly [name] is a 62 year old parous woman who is seen in consultation at the request of Dr Simona Huh for a large pelvic mass. The patient has 3 months of vague symptoms of fullness in the pelvis, urinary frequency and difficulty with defecation. She denies vaginal bleeding or rectal bleeding.  She was seen in an urgen care in July, 2018 and a TVUS was performed on 11/13/16 which showed a large hypoechoic lobular mass seen posterior to the uterus measuring 18.2x16.1x11.8cm, blood flow was seen along the periphery of the mass. It was considered to be likely to be a fibroid vs pelvic mass vs ovarian mass. Neither ovary was removed. Moderate amount of free fluid in the pelvis. THe uterus measures 4x10x4cm. The endometrium is 22m.  An MRI was performed on 12/05/16 which showed a 13.9x11.9x18.3cm T1 hypointense central pelvic mass favoring to arise from the right ovary. No peritoneal metastases were identified in the pelvis. They favor a solid ovarian neoplasm such as a brenner tumor or fibroma.  The patient is otherwise quite helathy. She has had 2 prior vaginal deliveries and a nissan fundoplication.   Interval Hx: On 12/26/16 she underwent ex lap, TAH, BSO, omentectomy, radical debulking surgery. Intraoperative findings were significant for a 20cm mass from the left ovary/tube and omental cake and 4L ascites. The  only residual disease at the completion of surgery was small 152mmilial implants on the intestines representing an optimal cytoreduction (R1). Final pathology revealed stage IIIC high grade serous carcinoma from the left fallopian tube metastatic to the ascites and omentum. In accordance with NCCN guidelines she was recommended to receive 6 cycles of adjuvant carboplatin and paclitaxel chemotherapy and referral for genetics counseling.  Chemotherapy with carboplatin and paclitaxel was completed between 12/26/16 to 05/05/17. She tolerated therapy very well. CA 125 at completion of therapy was normal at 10.6. CT abd/pelvis on 05/26/17 revealed no measurable disease. The patient was determined to have had a complete clinical response to primary therapy.  Genetic testing with Myrisk panel was negative for HRD associated mutations, therefore she was not considered for PARPi maintenance therapy .     Current Meds:  Outpatient Encounter Medications as of 08/27/2017  Medication Sig  . naproxen sodium (ANAPROX) 220 MG tablet Take 220-440 mg by mouth 2 (two) times daily as needed (pain).   . Marland Kitchenenlafaxine XR (EFFEXOR-XR) 37.5 MG 24 hr capsule Take 1 capsule (37.5 mg total) daily with breakfast by mouth.   Facility-Administered Encounter Medications as of 08/27/2017  Medication  . sodium chloride flush (NS) 0.9 % injection 10 mL  . sodium chloride flush (NS) 0.9 % injection 10 mL    Allergy:  Allergies  Allergen Reactions  . Dilaudid [Hydromorphone Hcl] Nausea And Vomiting  . Sudafed [Pseudoephedrine Hcl] Other (See Comments)    Nervous/jittery    Social Hx:   Social History   Socioeconomic  History  . Marital status: Married    Spouse name: [name]  . Number of children: 4  . Years of education: Not on file  . Highest education level: Not on file  Occupational History  . Occupation: Editor, commissioning  . Financial resource strain: Not on file  . Food insecurity:    Worry: Not on file     Inability: Not on file  . Transportation needs:    Medical: Not on file    Non-medical: Not on file  Tobacco Use  . Smoking status: Never Smoker  . Smokeless tobacco: Never Used  Substance and Sexual Activity  . Alcohol use: No  . Drug use: No  . Sexual activity: Not on file  Lifestyle  . Physical activity:    Days per week: Not on file    Minutes per session: Not on file  . Stress: Not on file  Relationships  . Social connections:    Talks on phone: Not on file    Gets together: Not on file    Attends religious service: Not on file    Active member of club or organization: Not on file    Attends meetings of clubs or organizations: Not on file    Relationship status: Not on file  . Intimate partner violence:    Fear of current or ex partner: Not on file    Emotionally abused: Not on file    Physically abused: Not on file    Forced sexual activity: Not on file  Other Topics Concern  . Not on file  Social History Narrative  . Not on file    Past Surgical Hx:  Past Surgical History:  Procedure Laterality Date  . DEBULKING N/A 12/26/2016   Procedure: RADICAL TUMOR DEBULKING; OMENTECTOMY;  Surgeon: Everitt Amber, MD;  Location: [facility];  Service: Gynecology;  Laterality: N/A;  . ganglion cyst removed    . Hital Hernia    . IR FLUORO GUIDE PORT INSERTION RIGHT  01/16/2017  . IR REMOVAL TUN ACCESS W/ PORT W/O FL MOD SED  06/10/2017  . IR US GUIDE VASC ACCESS RIGHT  01/16/2017  . LAPAROTOMY N/A 12/26/2016   Procedure: EXPLORATORY LAPAROTOMY;  Surgeon: Everitt Amber, MD;  Location: [facility];  Service: Gynecology;  Laterality: N/A;  . left morton's neuroma surgery     . Nissan Fundoplication      Past Medical Hx:  Past Medical History:  Diagnosis Date  . Complication of anesthesia    slow to wake up sometimes   . GERD (gastroesophageal reflux disease)   . History of chemotherapy   . History of hiatal hernia   . Hypertension   . Ovarian cancer ([place])   . PONV (postoperative nausea  and vomiting)     Past Gynecological History:  SVD x 2 No LMP recorded. Patient has had a hysterectomy.  Family Hx:  Family History  Problem Relation Age of Onset  . Diabetes Mother   . Diabetes Father   . Hypertension Father   . Stroke Father   . COPD Father   . Heart attack Sister 90  . Dementia Maternal Aunt   . Bone cancer Paternal Uncle        deceased at 41  . Dementia Maternal Grandmother   . Heart attack Paternal Grandmother   . Heart attack Paternal Grandfather   . Healthy Son   . Healthy Son     Review of Systems:  Constitutional  Feels well,  ENT Normal appearing ears and nares bilaterally Skin/Breast  No rash, sores, jaundice, itching, dryness Cardiovascular  No chest pain, shortness of breath, or edema  Pulmonary  No cough or wheeze.  Gastro Intestinal  No nausea, vomitting, or diarrhoea. No bright red blood per rectum, no abdominal pain, change in bowel movement, or constipation.  Genito Urinary  No frequency, no urgency, dysuria, bleeding Musculo Skeletal  No myalgia, arthralgia, joint swelling or pain  Neurologic  No weakness, numbness, change in gait,  Psychology  No depression, anxiety, insomnia.   Vitals:  Blood pressure 138/85, pulse 71, temperature 98.7 F (37.1 C), temperature source Oral, resp. rate 20, height '5\' 5"'  (1.651 m), weight 163 lb 12.8 oz (74.3 kg), SpO2 100 %.  Physical Exam: WD in NAD Neck  Supple NROM, without any enlargements.  Lymph Node Survey No cervical supraclavicular or inguinal adenopathy Cardiovascular  Pulse normal rate, regularity and rhythm. S1 and S2 normal.  Lungs  Clear to auscultation bilateraly, without wheezes/crackles/rhonchi. Good air movement.  Skin  No rash/lesions/breakdown  Psychiatry  Alert and oriented to person, place, and time  Abdomen  Normoactive bowel sounds, abdomen soft, non-tender and nonobese without evidence of hernia. Well healed right paramedian incision, soft, no  masses. Back No CVA tenderness Genito Urinary  Vulva/vagina: Normal external female genitalia.   No lesions. No discharge or bleeding. Vaginal cuff in tact and well healed. Rectal  deferred Extremities  No bilateral cyanosis, clubbing or edema.   Thereasa Solo, MD  08/27/2017, 10:48 AM

## 2017-08-27 NOTE — Patient Instructions (Signed)
Please notify Dr Denman George at phone number [phone] if you notice vaginal bleeding, new pelvic or abdominal pains, bloating, feeling full easy, or a change in bladder or bowel function.   Please return to see Dr Denman George in 3 months as scheduled.  If your "hip"/buttock continues to hurt, try decreasing the inclines at the gym and being evaluated by physical therapy.

## 2017-08-28 LAB — CA 125: Cancer Antigen (CA) 125: 9.8 U/mL (ref 0.0–38.1)

## 2017-08-29 ENCOUNTER — Ambulatory Visit: Payer: 59 | Admitting: Gynecologic Oncology

## 2017-09-03 ENCOUNTER — Ambulatory Visit: Payer: 59 | Admitting: Gynecologic Oncology

## 2017-10-06 ENCOUNTER — Telehealth: Payer: Self-pay | Admitting: *Deleted

## 2017-10-06 NOTE — Telephone Encounter (Signed)
Pt left a message requesting a renewal of her handicap parking tag. States she does not use it all of the time, but occasionally needs it.

## 2017-10-06 NOTE — Telephone Encounter (Signed)
Dr Alvy Bimler signed for handicap parking pass for 6 months. Pt notified

## 2017-10-06 NOTE — Telephone Encounter (Signed)
OK to renew one more time only

## 2017-10-13 ENCOUNTER — Telehealth: Payer: Self-pay | Admitting: *Deleted

## 2017-10-13 NOTE — Telephone Encounter (Signed)
I have never had patients described Taxol causing clogged tear duct It is not described in micromedex either I would defer to her eye doctor for management

## 2017-10-13 NOTE — Telephone Encounter (Signed)
Received call from pt stating that she finished her chemotherapy in Feb & now has some inflammation of the corner of her right eye.  She saw her eye Dr & was told that the drainage tube is clogged & that it could have been caused by the Taxol.  She states he tried to flush but it was too sore.  She was concerned that Taxol could have caused this.  Explained that it was possible, knowing that it can cause some tearing.  She states that she is OK but was just wondering if this was correct.  Message routed to Dr Marlinda Mike RN

## 2017-10-30 ENCOUNTER — Ambulatory Visit (HOSPITAL_COMMUNITY)
Admission: EM | Admit: 2017-10-30 | Discharge: 2017-10-30 | Disposition: A | Discharge status: Home / Self Care | Payer: 59 | Attending: Family Medicine | Admitting: Family Medicine

## 2017-10-30 ENCOUNTER — Encounter (HOSPITAL_COMMUNITY): Payer: Self-pay | Admitting: Emergency Medicine

## 2017-10-30 ENCOUNTER — Other Ambulatory Visit: Payer: Self-pay

## 2017-10-30 DIAGNOSIS — L237 Allergic contact dermatitis due to plants, except food: Secondary | ICD-10-CM | POA: Diagnosis not present

## 2017-10-30 MED ORDER — PREDNISONE 10 MG (21) PO TBPK: ORAL_TABLET | Freq: Every day | ORAL | 0 refills | Status: DC

## 2017-10-30 NOTE — ED Triage Notes (Signed)
Left lower leg rash for 3 weeks, itches

## 2017-11-12 NOTE — ED Provider Notes (Signed)
[place]   [id number] 10/30/17 Arrival Time: [place]:  1. Poison ivy dermatitis     Meds ordered this encounter  Medications  . predniSONE (STERAPRED UNI-PAK 21 TAB) 10 MG (21) TBPK tablet    Sig: Take by mouth daily. Take as directed.    Dispense:  21 tablet    Refill:  0   Will follow up with PCP or here if worsening or failing to improve as anticipated. Reviewed expectations re: course of current medical issues. Questions answered. Outlined signs and symptoms indicating need for more acute intervention. Patient verbalized understanding. After Visit Summary given.   SUBJECTIVE:  Holly [name] is a 63 y.o. female who presents with a skin complaint.   Location: L lower extremity Onset: gradual Duration: at least 2 weeks Pruritic? Yes Painful? No Progression: stable  Drainage? No  Known trigger? questions related to working outside [employer] soaps/lotions/topicals/detergents? No Contacts with similar? No Recent travel? No  Other associated symptoms: none Therapies tried thus far: OTC topical without much help Denies fever. No specific aggravating or alleviating factors reported.   ROS: As per HPI.  OBJECTIVE: Vitals:   10/30/17 1926  BP: (!) 147/71  Pulse: 63  Resp: 16  Temp: 97.9 F (36.6 C)  TempSrc: Oral  SpO2: 100%    General appearance: alert; no distress Lungs: clear to auscultation bilaterally Heart: regular rate and rhythm Extremities: no edema Skin: warm and dry; areas of linear papules and vesicles with surrounding erythema over LLE; no sign of infection Psychological: alert and cooperative; normal mood and affect  Allergies  Allergen Reactions  . Dilaudid [Hydromorphone Hcl] Nausea And Vomiting  . Sudafed [Pseudoephedrine Hcl] Other (See Comments)    Nervous/jittery    Past Medical History:  Diagnosis Date  . Complication of anesthesia    slow to wake up sometimes   . GERD (gastroesophageal reflux disease)     . History of chemotherapy   . History of hiatal hernia   . Hypertension   . Ovarian cancer ([place])   . PONV (postoperative nausea and vomiting)    Social History   Socioeconomic History  . Marital status: Married    Spouse name: [name]  . Number of children: 4  . Years of education: Not on file  . Highest education level: Not on file  Occupational History  . Occupation: Editor, commissioning  . Financial resource strain: Not on file  . Food insecurity:    Worry: Not on file    Inability: Not on file  . Transportation needs:    Medical: Not on file    Non-medical: Not on file  Tobacco Use  . Smoking status: Never Smoker  . Smokeless tobacco: Never Used  Substance and Sexual Activity  . Alcohol use: No  . Drug use: No  . Sexual activity: Not on file  Lifestyle  . Physical activity:    Days per week: Not on file    Minutes per session: Not on file  . Stress: Not on file  Relationships  . Social connections:    Talks on phone: Not on file    Gets together: Not on file    Attends religious service: Not on file    Active member of club or organization: Not on file    Attends meetings of clubs or organizations: Not on file    Relationship status: Not on file  . Intimate partner violence:    Fear of current or ex  partner: Not on file    Emotionally abused: Not on file    Physically abused: Not on file    Forced sexual activity: Not on file  Other Topics Concern  . Not on file  Social History Narrative  . Not on file   Family History  Problem Relation Age of Onset  . Diabetes Mother   . Diabetes Father   . Hypertension Father   . Stroke Father   . COPD Father   . Heart attack Sister 32  . Dementia Maternal Aunt   . Bone cancer Paternal Uncle        deceased at 11  . Dementia Maternal Grandmother   . Heart attack Paternal Grandmother   . Heart attack Paternal Grandfather   . Healthy Son   . Healthy Son    Past Surgical History:  Procedure Laterality Date   . DEBULKING N/A 12/26/2016   Procedure: RADICAL TUMOR DEBULKING; OMENTECTOMY;  Surgeon: Everitt Amber, MD;  Location: [facility];  Service: Gynecology;  Laterality: N/A;  . ganglion cyst removed    . Hital Hernia    . IR FLUORO GUIDE PORT INSERTION RIGHT  01/16/2017  . IR REMOVAL TUN ACCESS W/ PORT W/O FL MOD SED  06/10/2017  . IR US GUIDE VASC ACCESS RIGHT  01/16/2017  . LAPAROTOMY N/A 12/26/2016   Procedure: EXPLORATORY LAPAROTOMY;  Surgeon: Everitt Amber, MD;  Location: [facility];  Service: Gynecology;  Laterality: N/A;  . left morton's neuroma surgery     . Nissan Fundoplication       Vanessa Kick, MD 11/13/17 1007

## 2017-12-17 ENCOUNTER — Inpatient Hospital Stay: Payer: 59

## 2017-12-17 ENCOUNTER — Inpatient Hospital Stay: Payer: 59 | Attending: Gynecologic Oncology | Admitting: Gynecologic Oncology

## 2017-12-17 ENCOUNTER — Encounter: Payer: Self-pay | Admitting: Gynecologic Oncology

## 2017-12-17 ENCOUNTER — Telehealth: Payer: Self-pay | Admitting: Oncology

## 2017-12-17 VITALS — BP 158/87 | HR 78 | Temp 98.2°F | Resp 18 | Ht 65.0 in | Wt 175.4 lb

## 2017-12-17 DIAGNOSIS — C569 Malignant neoplasm of unspecified ovary: Secondary | ICD-10-CM

## 2017-12-17 DIAGNOSIS — Z90710 Acquired absence of both cervix and uterus: Secondary | ICD-10-CM | POA: Insufficient documentation

## 2017-12-17 DIAGNOSIS — Z9221 Personal history of antineoplastic chemotherapy: Secondary | ICD-10-CM | POA: Diagnosis not present

## 2017-12-17 DIAGNOSIS — Z08 Encounter for follow-up examination after completed treatment for malignant neoplasm: Secondary | ICD-10-CM | POA: Diagnosis not present

## 2017-12-17 DIAGNOSIS — Z90722 Acquired absence of ovaries, bilateral: Secondary | ICD-10-CM | POA: Insufficient documentation

## 2017-12-17 DIAGNOSIS — L309 Dermatitis, unspecified: Secondary | ICD-10-CM

## 2017-12-17 DIAGNOSIS — Z8543 Personal history of malignant neoplasm of ovary: Secondary | ICD-10-CM | POA: Insufficient documentation

## 2017-12-17 DIAGNOSIS — R1900 Intra-abdominal and pelvic swelling, mass and lump, unspecified site: Secondary | ICD-10-CM

## 2017-12-17 NOTE — Patient Instructions (Signed)
Please notify Dr Denman George at phone number [phone] if you notice vaginal bleeding, new pelvic or abdominal pains, bloating, feeling full easy, or a change in bladder or bowel function.    Dr Serita Grit office will contact [facility] to facilitate a referral.

## 2017-12-17 NOTE — Progress Notes (Signed)
Follow-up Note: Gyn-Onc  Consult was requested by Dr. Simona Huh for the evaluation of Holly [name] 62 y.o. female  CC:  Chief Complaint  Patient presents with  . Ovarian Cancer  . Rash    Assessment/Plan:  Ms. ALFIE ALDERFER  is a 62 y.o.  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 S/p completion of 6 cycles adjuvant IV carb/taxol in January, 2019. BRCA negative.  Complete clinical response.  Check CA 125 today. Re-evaluate for recurrence in 3 months with labs.   Rash - unexplained dermatitis refractory to oral steroids. Will refer to dermatology.   HPI: Holly [name] is a 62 year old parous woman who is seen in consultation at the request of Dr Simona Huh for a large pelvic mass. The patient has 3 months of vague symptoms of fullness in the pelvis, urinary frequency and difficulty with defecation. She denies vaginal bleeding or rectal bleeding.  She was seen in an urgen care in July, 2018 and a TVUS was performed on 11/13/16 which showed a large hypoechoic lobular mass seen posterior to the uterus measuring 18.2x16.1x11.8cm, blood flow was seen along the periphery of the mass. It was considered to be likely to be a fibroid vs pelvic mass vs ovarian mass. Neither ovary was removed. Moderate amount of free fluid in the pelvis. THe uterus measures 4x10x4cm. The endometrium is 79m.  An MRI was performed on 12/05/16 which showed a 13.9x11.9x18.3cm T1 hypointense central pelvic mass favoring to arise from the right ovary. No peritoneal metastases were identified in the pelvis. They favor a solid ovarian neoplasm such as a brenner tumor or fibroma.  The patient is otherwise quite helathy. She has had 2 prior vaginal deliveries and a nissan fundoplication.   On 12/26/16 she underwent ex lap, TAH, BSO, omentectomy, radical debulking surgery. Intraoperative findings were significant for a 20cm mass from the left  ovary/tube and omental cake and 4L ascites. The only residual disease at the completion of surgery was small 137mmilial implants on the intestines representing an optimal cytoreduction (R1). Final pathology revealed stage IIIC high grade serous carcinoma from the left fallopian tube metastatic to the ascites and omentum. In accordance with NCCN guidelines she was recommended to receive 6 cycles of adjuvant carboplatin and paclitaxel chemotherapy and referral for genetics counseling.  Chemotherapy with carboplatin and paclitaxel was completed between 12/26/16 to 05/05/17. She tolerated therapy very well. CA 125 at completion of therapy was normal at 10.6. CT abd/pelvis on 05/26/17 revealed no measurable disease. The patient was determined to have had a complete clinical response to primary therapy.  Genetic testing with Myrisk panel was negative for HRD associated mutations, therefore she was not considered for PARPi maintenance therapy .    Interval Hx: CA 125 on 08/27/17 was normal at 9.8.  For 1 month she reports a very itchy rash on her bilateral lower and upper extremities and back. It was slightly improved after an oral steroid taper but is persisting and still very itchy.    Current Meds:  Outpatient Encounter Medications as of 12/17/2017  Medication Sig  . venlafaxine XR (EFFEXOR-XR) 37.5 MG 24 hr capsule Take 1 capsule (37.5 mg total) daily with breakfast by mouth.  . [DISCONTINUED] naproxen sodium (ANAPROX) 220 MG tablet Take 220-440 mg by mouth 2 (two) times daily as needed (pain).   . [DISCONTINUED] predniSONE (STERAPRED UNI-PAK 21 TAB) 10 MG (21) TBPK tablet Take by  mouth daily. Take as directed.   Facility-Administered Encounter Medications as of 12/17/2017  Medication  . sodium chloride flush (NS) 0.9 % injection 10 mL  . sodium chloride flush (NS) 0.9 % injection 10 mL    Allergy:  Allergies  Allergen Reactions  . Dilaudid [Hydromorphone Hcl] Nausea And Vomiting  . Sudafed  [Pseudoephedrine Hcl] Other (See Comments)    Nervous/jittery    Social Hx:   Social History   Socioeconomic History  . Marital status: Married    Spouse name: [name]  . Number of children: 4  . Years of education: Not on file  . Highest education level: Not on file  Occupational History  . Occupation: Editor, commissioning  . Financial resource strain: Not on file  . Food insecurity:    Worry: Not on file    Inability: Not on file  . Transportation needs:    Medical: Not on file    Non-medical: Not on file  Tobacco Use  . Smoking status: Never Smoker  . Smokeless tobacco: Never Used  Substance and Sexual Activity  . Alcohol use: No  . Drug use: No  . Sexual activity: Not on file  Lifestyle  . Physical activity:    Days per week: Not on file    Minutes per session: Not on file  . Stress: Not on file  Relationships  . Social connections:    Talks on phone: Not on file    Gets together: Not on file    Attends religious service: Not on file    Active member of club or organization: Not on file    Attends meetings of clubs or organizations: Not on file    Relationship status: Not on file  . Intimate partner violence:    Fear of current or ex partner: Not on file    Emotionally abused: Not on file    Physically abused: Not on file    Forced sexual activity: Not on file  Other Topics Concern  . Not on file  Social History Narrative  . Not on file    Past Surgical Hx:  Past Surgical History:  Procedure Laterality Date  . DEBULKING N/A 12/26/2016   Procedure: RADICAL TUMOR DEBULKING; OMENTECTOMY;  Surgeon: Everitt Amber, MD;  Location: [facility];  Service: Gynecology;  Laterality: N/A;  . ganglion cyst removed    . Hital Hernia    . IR FLUORO GUIDE PORT INSERTION RIGHT  01/16/2017  . IR REMOVAL TUN ACCESS W/ PORT W/O FL MOD SED  06/10/2017  . IR US GUIDE VASC ACCESS RIGHT  01/16/2017  . LAPAROTOMY N/A 12/26/2016   Procedure: EXPLORATORY LAPAROTOMY;  Surgeon: Everitt Amber,  MD;  Location: [facility];  Service: Gynecology;  Laterality: N/A;  . left morton's neuroma surgery     . Nissan Fundoplication      Past Medical Hx:  Past Medical History:  Diagnosis Date  . Complication of anesthesia    slow to wake up sometimes   . GERD (gastroesophageal reflux disease)   . History of chemotherapy   . History of hiatal hernia   . Hypertension   . Ovarian cancer ([place])   . PONV (postoperative nausea and vomiting)     Past Gynecological History:  SVD x 2 No LMP recorded. Patient has had a hysterectomy.  Family Hx:  Family History  Problem Relation Age of Onset  . Diabetes Mother   . Diabetes Father   . Hypertension Father   . Stroke  Father   . COPD Father   . Heart attack Sister 61  . Dementia Maternal Aunt   . Bone cancer Paternal Uncle        deceased at 36  . Dementia Maternal Grandmother   . Heart attack Paternal Grandmother   . Heart attack Paternal Grandfather   . Healthy Son   . Healthy Son     Review of Systems:  Constitutional  Feels well,    ENT Normal appearing ears and nares bilaterally Skin/Breast  +itchy rash (generalized) Cardiovascular  No chest pain, shortness of breath, or edema  Pulmonary  No cough or wheeze.  Gastro Intestinal  No nausea, vomitting, or diarrhoea. No bright red blood per rectum, no abdominal pain, change in bowel movement, or constipation.  Genito Urinary  No frequency, no urgency, dysuria, bleeding Musculo Skeletal  No myalgia, arthralgia, joint swelling or pain  Neurologic  No weakness, numbness, change in gait,  Psychology  No depression, anxiety, insomnia.   Vitals:  Blood pressure (!) 158/87, pulse 78, temperature 98.2 F (36.8 C), temperature source Oral, resp. rate 18, height '5\' 5"'  (1.651 m), weight 175 lb 6.4 oz (79.6 kg), SpO2 99 %.  Physical Exam: WD in NAD Neck  Supple NROM, without any enlargements.  Lymph Node Survey No cervical supraclavicular or inguinal adenopathy Cardiovascular   Pulse normal rate, regularity and rhythm. S1 and S2 normal.  Lungs  Clear to auscultation bilateraly, without wheezes/crackles/rhonchi. Good air movement.  Skin  No rash/lesions/breakdown  Psychiatry  Alert and oriented to person, place, and time  Abdomen  Normoactive bowel sounds, abdomen soft, non-tender and nonobese without evidence of hernia. Well healed right paramedian incision, soft, no masses. Back No CVA tenderness Genito Urinary  Vulva/vagina: Normal external female genitalia.   No lesions. No discharge or bleeding. Vaginal cuff in tact and well healed. Rectal  deferred Extremities  +excoriations from maculopapular erythematous rash   Thereasa Solo, MD  12/17/2017, 1:47 PM

## 2017-12-17 NOTE — Telephone Encounter (Signed)
Called in referral to [facility] and was told the first open appointment is in January.  They recommending sending patient's notes to the referral coordinator, Monica at [phone]. Office note was faxed.

## 2017-12-18 ENCOUNTER — Telehealth: Payer: Self-pay

## 2017-12-18 ENCOUNTER — Encounter: Payer: Self-pay | Admitting: Gynecologic Oncology

## 2017-12-18 LAB — CA 125: Cancer Antigen (CA) 125: 10.4 U/mL (ref 0.0–38.1)

## 2017-12-18 NOTE — Telephone Encounter (Signed)
Told Holly [name] that her CA-125 was WNL at 10.4

## 2017-12-23 ENCOUNTER — Telehealth: Payer: Self-pay | Admitting: Oncology

## 2017-12-23 NOTE — Telephone Encounter (Signed)
Called Nature to see if [facility] had contacted her yet.  She said they called this morning and she has an appointment tomorrow morning.

## 2018-02-04 ENCOUNTER — Telehealth: Payer: Self-pay | Admitting: *Deleted

## 2018-02-04 NOTE — Telephone Encounter (Signed)
Patient called and stated "I have been on antibiotics for a while now. Was on doxycycline for a one round, was diagnosed with a staph infection on my arms and legs. Then it came back and now I'm on doxycycline for 30 days. Will it be ok to start taking some probiotics?" Per Melissa APP "from a cancer stand point it is ok to start probiotics, but check with the physician that prescripted the antibiotics. Message given to the patient.

## 2018-03-09 ENCOUNTER — Inpatient Hospital Stay (HOSPITAL_BASED_OUTPATIENT_CLINIC_OR_DEPARTMENT_OTHER): Payer: 59 | Admitting: Gynecologic Oncology

## 2018-03-09 ENCOUNTER — Encounter: Payer: Self-pay | Admitting: Gynecologic Oncology

## 2018-03-09 ENCOUNTER — Inpatient Hospital Stay: Payer: 59 | Attending: Gynecologic Oncology

## 2018-03-09 VITALS — BP 139/74 | HR 73 | Temp 98.1°F | Resp 20 | Ht 65.0 in | Wt 182.6 lb

## 2018-03-09 DIAGNOSIS — Z90722 Acquired absence of ovaries, bilateral: Secondary | ICD-10-CM | POA: Diagnosis not present

## 2018-03-09 DIAGNOSIS — Z90710 Acquired absence of both cervix and uterus: Secondary | ICD-10-CM | POA: Diagnosis not present

## 2018-03-09 DIAGNOSIS — C569 Malignant neoplasm of unspecified ovary: Secondary | ICD-10-CM

## 2018-03-09 DIAGNOSIS — Z08 Encounter for follow-up examination after completed treatment for malignant neoplasm: Secondary | ICD-10-CM | POA: Insufficient documentation

## 2018-03-09 DIAGNOSIS — Z9221 Personal history of antineoplastic chemotherapy: Secondary | ICD-10-CM | POA: Diagnosis not present

## 2018-03-09 DIAGNOSIS — Z8543 Personal history of malignant neoplasm of ovary: Secondary | ICD-10-CM

## 2018-03-09 DIAGNOSIS — R1900 Intra-abdominal and pelvic swelling, mass and lump, unspecified site: Secondary | ICD-10-CM

## 2018-03-09 NOTE — Progress Notes (Signed)
Follow-up Note: Gyn-Onc  Consult was requested by Dr. Simona Huh for the evaluation of Holly [name] 62 y.o. female  CC:  Chief Complaint  Patient presents with  . Ovarian Cancer    follow-up    Assessment/Plan:  Holly [name]  is a 62 y.o.  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 S/p completion of 6 cycles adjuvant IV carb/taxol in January, 2019. BRCA/HRD negative.  Complete enduring clinical response.  Check CA 125 today. Re-evaluate for recurrence in 3 months with labs.   HPI: Holly [name] is a 62 year old parous woman who is seen in consultation at the request of Dr Simona Huh for a large pelvic mass. The patient has 3 months of vague symptoms of fullness in the pelvis, urinary frequency and difficulty with defecation. She denies vaginal bleeding or rectal bleeding.  She was seen in an urgen care in July, 2018 and a TVUS was performed on 11/13/16 which showed a large hypoechoic lobular mass seen posterior to the uterus measuring 18.2x16.1x11.8cm, blood flow was seen along the periphery of the mass. It was considered to be likely to be a fibroid vs pelvic mass vs ovarian mass. Neither ovary was removed. Moderate amount of free fluid in the pelvis. THe uterus measures 4x10x4cm. The endometrium is 83m.  An MRI was performed on 12/05/16 which showed a 13.9x11.9x18.3cm T1 hypointense central pelvic mass favoring to arise from the right ovary. No peritoneal metastases were identified in the pelvis. They favor a solid ovarian neoplasm such as a brenner tumor or fibroma.  The patient is otherwise quite helathy. She has had 2 prior vaginal deliveries and a nissan fundoplication.   On 12/26/16 she underwent ex lap, TAH, BSO, omentectomy, radical debulking surgery. Intraoperative findings were significant for a 20cm mass from the left ovary/tube and omental cake and 4L ascites. The only residual disease at the  completion of surgery was small 137mmilial implants on the intestines representing an optimal cytoreduction (R1). Final pathology revealed stage IIIC high grade serous carcinoma from the left fallopian tube metastatic to the ascites and omentum. In accordance with NCCN guidelines she was recommended to receive 6 cycles of adjuvant carboplatin and paclitaxel chemotherapy and referral for genetics counseling.  Chemotherapy with carboplatin and paclitaxel was completed between 12/26/16 to 05/05/17. She tolerated therapy very well. CA 125 at completion of therapy was normal at 10.6. CT abd/pelvis on 05/26/17 revealed no measurable disease. The patient was determined to have had a complete clinical response to primary therapy.  Genetic testing with Myrisk panel was negative for HRD associated mutations, therefore she was not considered for PARPi maintenance therapy .    Interval Hx: CA 125 on 08/27/17 was normal at 9.8. CA 125 on 12/17/17 was normal at 10.4. CA 125 on 03/09/18 is pending.   She has no symptoms concerning for recurrence.    Current Meds:  Outpatient Encounter Medications as of 03/09/2018  Medication Sig  . venlafaxine XR (EFFEXOR-XR) 37.5 MG 24 hr capsule Take 1 capsule (37.5 mg total) daily with breakfast by mouth.   Facility-Administered Encounter Medications as of 03/09/2018  Medication  . sodium chloride flush (NS) 0.9 % injection 10 mL  . sodium chloride flush (NS) 0.9 % injection 10 mL    Allergy:  Allergies  Allergen Reactions  . Dilaudid [Hydromorphone Hcl] Nausea And Vomiting  . Sudafed [Pseudoephedrine Hcl] Other (See Comments)    Nervous/jittery  Social Hx:   Social History   Socioeconomic History  . Marital status: Married    Spouse name: [name]  . Number of children: 4  . Years of education: Not on file  . Highest education level: Not on file  Occupational History  . Occupation: Editor, commissioning  . Financial resource strain: Not on file  . Food  insecurity:    Worry: Not on file    Inability: Not on file  . Transportation needs:    Medical: Not on file    Non-medical: Not on file  Tobacco Use  . Smoking status: Never Smoker  . Smokeless tobacco: Never Used  Substance and Sexual Activity  . Alcohol use: No  . Drug use: No  . Sexual activity: Not on file  Lifestyle  . Physical activity:    Days per week: Not on file    Minutes per session: Not on file  . Stress: Not on file  Relationships  . Social connections:    Talks on phone: Not on file    Gets together: Not on file    Attends religious service: Not on file    Active member of club or organization: Not on file    Attends meetings of clubs or organizations: Not on file    Relationship status: Not on file  . Intimate partner violence:    Fear of current or ex partner: Not on file    Emotionally abused: Not on file    Physically abused: Not on file    Forced sexual activity: Not on file  Other Topics Concern  . Not on file  Social History Narrative  . Not on file    Past Surgical Hx:  Past Surgical History:  Procedure Laterality Date  . DEBULKING N/A 12/26/2016   Procedure: RADICAL TUMOR DEBULKING; OMENTECTOMY;  Surgeon: Everitt Amber, MD;  Location: [facility];  Service: Gynecology;  Laterality: N/A;  . ganglion cyst removed    . Hital Hernia    . IR FLUORO GUIDE PORT INSERTION RIGHT  01/16/2017  . IR REMOVAL TUN ACCESS W/ PORT W/O FL MOD SED  06/10/2017  . IR US GUIDE VASC ACCESS RIGHT  01/16/2017  . LAPAROTOMY N/A 12/26/2016   Procedure: EXPLORATORY LAPAROTOMY;  Surgeon: Everitt Amber, MD;  Location: [facility];  Service: Gynecology;  Laterality: N/A;  . left morton's neuroma surgery     . Nissan Fundoplication      Past Medical Hx:  Past Medical History:  Diagnosis Date  . Complication of anesthesia    slow to wake up sometimes   . GERD (gastroesophageal reflux disease)   . History of chemotherapy   . History of hiatal hernia   . Hypertension   . Ovarian cancer  ([place])   . PONV (postoperative nausea and vomiting)     Past Gynecological History:  SVD x 2 No LMP recorded. Patient has had a hysterectomy.  Family Hx:  Family History  Problem Relation Age of Onset  . Diabetes Mother   . Diabetes Father   . Hypertension Father   . Stroke Father   . COPD Father   . Heart attack Sister 18  . Dementia Maternal Aunt   . Bone cancer Paternal Uncle        deceased at 26  . Dementia Maternal Grandmother   . Heart attack Paternal Grandmother   . Heart attack Paternal Grandfather   . Healthy Son   . Healthy Son     Review  of Systems:  Constitutional  Feels well,    ENT Normal appearing ears and nares bilaterally Skin/Breast  No rash Cardiovascular  No chest pain, shortness of breath, or edema  Pulmonary  No cough or wheeze.  Gastro Intestinal  No nausea, vomitting, or diarrhoea. No bright red blood per rectum, no abdominal pain, change in bowel movement, or constipation.  Genito Urinary  No frequency, no urgency, dysuria, bleeding Musculo Skeletal  No myalgia, arthralgia, joint swelling or pain  Neurologic  No weakness, numbness, change in gait,  Psychology  No depression, anxiety, insomnia.   Vitals:  Blood pressure 139/74, pulse 73, temperature 98.1 F (36.7 C), temperature source Oral, resp. rate 20, height '5\' 5"'  (1.651 m), weight 182 lb 9.6 oz (82.8 kg), SpO2 97 %.  Physical Exam: WD in NAD Neck  Supple NROM, without any enlargements.  Lymph Node Survey No cervical supraclavicular or inguinal adenopathy Cardiovascular  Pulse normal rate, regularity and rhythm. S1 and S2 normal.  Lungs  Clear to auscultation bilateraly, without wheezes/crackles/rhonchi. Good air movement.  Skin  No rash/lesions/breakdown  Psychiatry  Alert and oriented to person, place, and time  Abdomen  Normoactive bowel sounds, abdomen soft, non-tender and nonobese without evidence of hernia. Well healed right paramedian incision, soft, no  masses. Back No CVA tenderness Genito Urinary  Vulva/vagina: Normal external female genitalia.   No lesions. No discharge or bleeding. Vaginal cuff in tact and well healed. Rectal  deferred Extremities  Resolved rash   Thereasa Solo, MD  03/09/2018, 12:32 PM

## 2018-03-09 NOTE — Patient Instructions (Signed)
Dr Serita Grit office will contact you with today's CA 125 result.  Please notify Dr Denman George at phone number [phone] if you notice vaginal bleeding, new pelvic or abdominal pains, bloating, feeling full easy, or a change in bladder or bowel function.   Please return to see Dr Denman George in February, 2020.

## 2018-03-10 ENCOUNTER — Telehealth: Payer: Self-pay | Admitting: Gynecologic Oncology

## 2018-03-10 LAB — CA 125: Cancer Antigen (CA) 125: 12.1 U/mL (ref 0.0–38.1)

## 2018-03-10 NOTE — Telephone Encounter (Signed)
Returned call to patient.  Notified of CA 125 results. No concerns voiced. Advised to call for any needs.

## 2018-04-21 ENCOUNTER — Encounter (HOSPITAL_COMMUNITY): Payer: Self-pay | Admitting: *Deleted

## 2018-04-21 ENCOUNTER — Emergency Department (HOSPITAL_COMMUNITY)
Admission: EM | Admit: 2018-04-21 | Discharge: 2018-04-21 | Disposition: A | Discharge status: Home / Self Care | Payer: 59 | Attending: Emergency Medicine | Admitting: Emergency Medicine

## 2018-04-21 ENCOUNTER — Emergency Department (HOSPITAL_COMMUNITY): Payer: 59

## 2018-04-21 DIAGNOSIS — S99912A Unspecified injury of left ankle, initial encounter: Secondary | ICD-10-CM | POA: Diagnosis present

## 2018-04-21 DIAGNOSIS — I10 Essential (primary) hypertension: Secondary | ICD-10-CM | POA: Diagnosis not present

## 2018-04-21 DIAGNOSIS — S82832A Other fracture of upper and lower end of left fibula, initial encounter for closed fracture: Secondary | ICD-10-CM

## 2018-04-21 DIAGNOSIS — Y92009 Unspecified place in unspecified non-institutional (private) residence as the place of occurrence of the external cause: Secondary | ICD-10-CM | POA: Insufficient documentation

## 2018-04-21 DIAGNOSIS — Y998 Other external cause status: Secondary | ICD-10-CM | POA: Diagnosis not present

## 2018-04-21 DIAGNOSIS — Z79899 Other long term (current) drug therapy: Secondary | ICD-10-CM | POA: Insufficient documentation

## 2018-04-21 DIAGNOSIS — Y939 Activity, unspecified: Secondary | ICD-10-CM | POA: Insufficient documentation

## 2018-04-21 DIAGNOSIS — W010XXA Fall on same level from slipping, tripping and stumbling without subsequent striking against object, initial encounter: Secondary | ICD-10-CM | POA: Diagnosis not present

## 2018-04-21 NOTE — ED Provider Notes (Signed)
[facility]ER[facility] DEPARTMENT Provider Note   CSN: [id number] Arrival date & time: 04/21/18  8527     History   Chief Complaint Chief Complaint  Patient presents with  . Ankle Injury    HPI Holly [name] is a 62 y.o. female.  HPI   62 year old female presents today with complaints of left ankle injury.  Patient notes she was in her house when she twisted her left ankle after a fall.  She denies any other injuries, notes pain to the left lateral ankle.  No pain to the knee.    Past Medical History:  Diagnosis Date  . Complication of anesthesia    slow to wake up sometimes   . GERD (gastroesophageal reflux disease)   . History of chemotherapy   . History of hiatal hernia   . Hypertension   . Ovarian cancer ([place])   . PONV (postoperative nausea and vomiting)     Patient Active Problem List   Diagnosis Date Noted  . Pancytopenia, acquired ([place]) 05/28/2017  . Essential hypertension 05/05/2017  . Peripheral neuropathy due to chemotherapy ([place]) 03/04/2017  . Hot flashes, menopausal 03/04/2017  . Genetic testing 01/31/2017  . Goals of care, counseling/discussion 01/21/2017  . Physical debility 01/03/2017  . 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 Ovarian cancer ([place])   . Pelvic mass in female   . Left ovarian epithelial cancer ([place])   . Secondary malignant neoplasm of parietal peritoneum ([place])   . Secondary malignant neoplasm of omentum [facility])     Past Surgical History:  Procedure Laterality Date  . DEBULKING N/A 12/26/2016   Procedure: RADICAL TUMOR DEBULKING; OMENTECTOMY;  Surgeon: Everitt Amber, MD;  Location: [facility] ORS;  Service: Gynecology;  Laterality: N/A;  . ganglion cyst removed    . Hital Hernia    . IR FLUORO GUIDE PORT INSERTION RIGHT  01/16/2017  . IR REMOVAL TUN ACCESS W/ PORT W/O FL MOD SED  06/10/2017  . IR US GUIDE VASC ACCESS RIGHT  01/16/2017  . LAPAROTOMY N/A 12/26/2016   Procedure: EXPLORATORY LAPAROTOMY;  Surgeon:  Everitt Amber, MD;  Location: [facility];  Service: Gynecology;  Laterality: N/A;  . left morton's neuroma surgery     . Nissan Fundoplication       OB History   No obstetric history on file.      Home Medications    Prior to Admission medications   Medication Sig Start Date End Date Taking? Authorizing Provider  venlafaxine XR (EFFEXOR-XR) 37.5 MG 24 hr capsule Take 1 capsule (37.5 mg total) daily with breakfast by mouth. 03/05/17   Dorothyann Gibbs, NP    Family History Family History  Problem Relation Age of Onset  . Diabetes Mother   . Diabetes Father   . Hypertension Father   . Stroke Father   . COPD Father   . Heart attack Sister 72  . Dementia Maternal Aunt   . Bone cancer Paternal Uncle        deceased at 3  . Dementia Maternal Grandmother   . Heart attack Paternal Grandmother   . Heart attack Paternal Grandfather   . Healthy Son   . Healthy Son     Social History Social History   Tobacco Use  . Smoking status: Never Smoker  . Smokeless tobacco: Never Used  Substance Use Topics  . Alcohol use: No  . Drug use: No     Allergies   Dilaudid [hydromorphone hcl] and Sudafed [  pseudoephedrine hcl]   Review of Systems Review of Systems  All other systems reviewed and are negative.    Physical Exam Updated Vital Signs BP (!) 164/80 (BP Location: Right Arm)   Pulse 70   Temp 98.3 F (36.8 C) (Oral)   Resp 17   SpO2 97%   Physical Exam Vitals signs and nursing note reviewed.  Constitutional:      Appearance: She is well-developed.  HENT:     Head: Normocephalic and atraumatic.  Eyes:     General: No scleral icterus.       Right eye: No discharge.        Left eye: No discharge.     Conjunctiva/sclera: Conjunctivae normal.     Pupils: Pupils are equal, round, and reactive to light.  Neck:     Musculoskeletal: Normal range of motion.     Vascular: No JVD.     Trachea: No tracheal deviation.  Pulmonary:     Effort: Pulmonary effort is normal.      Breath sounds: No stridor.  Musculoskeletal:     Comments: Swelling noted to the lateral malleolus with tenderness to palpation, no significant laxity noted at the joint no open wounds, no pain to the remainder of the lower extremity including fibular head-foot nontender to palpation pedal pulse 2+ sensation intact-plantar and dorsiflexion intact  Neurological:     Mental Status: She is alert and oriented to person, place, and time.     Coordination: Coordination normal.  Psychiatric:        Behavior: Behavior normal.        Thought Content: Thought content normal.        Judgment: Judgment normal.      ED Treatments / Results  Labs (all labs ordered are listed, but only abnormal results are displayed) Labs Reviewed - No data to display  EKG None  Radiology Dg Ankle Complete Left  Result Date: 04/21/2018 CLINICAL DATA:  Fall.  Twisted ankle.  Pain. EXAM: LEFT ANKLE COMPLETE - 3+ VIEW COMPARISON:  No recent. FINDINGS: Soft tissue swelling. Mild degenerative change. Nondisplaced fracture of the distal fibula is present. No other acute abnormality identified. IMPRESSION: Nondisplaced fracture of the distal fibula. Electronically Signed   By: Marcello Moores  Register   On: 04/21/2018 08:36    Procedures Procedures (including critical care time)  Medications Ordered in ED Medications - No data to display   Initial Impression / Assessment and Plan / ED Course  I have reviewed the triage vital signs and the nursing notes.  Pertinent labs & imaging results that were available during my care of the patient were reviewed by me and considered in my medical decision making (see chart for details).     62 year old female presents today with distal uvular fracture.  No displacement, no other acute injuries.  Patient be placed in a boot encouraged nonweightbearing for several days with slow progression to weightbearing.  She will follow-up as an outpatient with her orthopedic surgeon Dr.  Mardelle Matte.  She is given strict return precautions, she verbalized understanding and agreement to today's plan had no further questions or concerns.  Final Clinical Impressions(s) / ED Diagnoses   Final diagnoses:  Closed fracture of distal end of left fibula, unspecified fracture morphology, initial encounter    ED Discharge Orders    None       Okey Regal, PA-C 04/21/18 1535    Tegeler, Gwenyth Allegra, MD 04/21/18 [phone]

## 2018-04-21 NOTE — ED Notes (Signed)
Ortho has been paged.

## 2018-04-21 NOTE — Progress Notes (Signed)
Orthopedic Tech Progress Note Patient Details:  Holly [name] 1955-10-11 [id number]  Ortho Devices Type of Ortho Device: Crutches, CAM walker Ortho Device/Splint Location: lle Ortho Device/Splint Interventions: Ordered, Application, Adjustment   Post Interventions Patient Tolerated: Well Instructions Provided: Care of device, Adjustment of device   [name] 04/21/2018, 10:46 AM

## 2018-04-21 NOTE — Discharge Instructions (Addendum)
Please read attached information. If you experience any new or worsening signs or symptoms please return to the emergency room for evaluation. Please follow-up with your primary care provider or specialist as discussed.  °

## 2018-04-21 NOTE — ED Triage Notes (Signed)
Pt in after twisting her left ankle during a fall this morning, states she tripped, denies hitting her head or LOC, no distress noted

## 2018-04-21 NOTE — ED Notes (Signed)
Pt verbalized understanding of discharge instructions and denies any further questions at this time.   

## 2018-04-27 DIAGNOSIS — S8265XA Nondisplaced fracture of lateral malleolus of left fibula, initial encounter for closed fracture: Secondary | ICD-10-CM | POA: Diagnosis not present

## 2018-05-04 DIAGNOSIS — S8265XD Nondisplaced fracture of lateral malleolus of left fibula, subsequent encounter for closed fracture with routine healing: Secondary | ICD-10-CM | POA: Diagnosis not present

## 2018-05-11 DIAGNOSIS — S8265XD Nondisplaced fracture of lateral malleolus of left fibula, subsequent encounter for closed fracture with routine healing: Secondary | ICD-10-CM | POA: Diagnosis not present

## 2018-05-21 ENCOUNTER — Telehealth: Payer: Self-pay

## 2018-05-21 NOTE — Telephone Encounter (Signed)
Outgoing call to patient - per Dr Denman George notes - scheduled her to come in a little early for lab portion at 12:45 then appt with Dr Denman George at 1:30 pm. No other needs per pt at this time.

## 2018-05-22 DIAGNOSIS — H0289 Other specified disorders of eyelid: Secondary | ICD-10-CM | POA: Diagnosis not present

## 2018-05-26 ENCOUNTER — Inpatient Hospital Stay: Payer: BLUE CROSS/BLUE SHIELD | Attending: Gynecologic Oncology

## 2018-05-26 ENCOUNTER — Encounter: Payer: Self-pay | Admitting: Gynecologic Oncology

## 2018-05-26 ENCOUNTER — Inpatient Hospital Stay (HOSPITAL_BASED_OUTPATIENT_CLINIC_OR_DEPARTMENT_OTHER): Payer: BLUE CROSS/BLUE SHIELD | Admitting: Gynecologic Oncology

## 2018-05-26 VITALS — BP 139/72 | HR 97 | Temp 97.9°F | Resp 18 | Ht 65.0 in | Wt 185.0 lb

## 2018-05-26 DIAGNOSIS — Z90722 Acquired absence of ovaries, bilateral: Secondary | ICD-10-CM | POA: Insufficient documentation

## 2018-05-26 DIAGNOSIS — Z90710 Acquired absence of both cervix and uterus: Secondary | ICD-10-CM

## 2018-05-26 DIAGNOSIS — Z8543 Personal history of malignant neoplasm of ovary: Secondary | ICD-10-CM | POA: Diagnosis not present

## 2018-05-26 DIAGNOSIS — C569 Malignant neoplasm of unspecified ovary: Secondary | ICD-10-CM

## 2018-05-26 DIAGNOSIS — Z08 Encounter for follow-up examination after completed treatment for malignant neoplasm: Secondary | ICD-10-CM

## 2018-05-26 DIAGNOSIS — Z9221 Personal history of antineoplastic chemotherapy: Secondary | ICD-10-CM

## 2018-05-26 NOTE — Patient Instructions (Signed)
Follow up in May.  We will call you with your results of bloodwork from today. Call for any concerns or questions before your May appointment.

## 2018-05-26 NOTE — Progress Notes (Signed)
Follow-up Note: Gyn-Onc  Consult was requested by Dr. Simona Huh for the evaluation of Holly [name] 63 y.o. female  CC:  Chief Complaint  Patient presents with  . fallopian tube cancer    follow-up    Assessment/Plan:  Holly [name]  is a 63 y.o.  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 S/p completion of 6 cycles adjuvant IV carb/taxol in January, 2019. BRCA/HRD negative.  Complete enduring clinical response.  Check CA 125 today. Re-evaluate for recurrence in 3 months with labs.   HPI: Holly [name] is a 63 year old parous woman who is seen in consultation at the request of Dr Simona Huh for a large pelvic mass. The patient has 3 months of vague symptoms of fullness in the pelvis, urinary frequency and difficulty with defecation. She denies vaginal bleeding or rectal bleeding.  She was seen in an urgen care in July, 2018 and a TVUS was performed on 11/13/16 which showed a large hypoechoic lobular mass seen posterior to the uterus measuring 18.2x16.1x11.8cm, blood flow was seen along the periphery of the mass. It was considered to be likely to be a fibroid vs pelvic mass vs ovarian mass. Neither ovary was removed. Moderate amount of free fluid in the pelvis. THe uterus measures 4x10x4cm. The endometrium is 98m.  An MRI was performed on 12/05/16 which showed a 13.9x11.9x18.3cm T1 hypointense central pelvic mass favoring to arise from the right ovary. No peritoneal metastases were identified in the pelvis. They favor a solid ovarian neoplasm such as a brenner tumor or fibroma.  The patient is otherwise quite helathy. She has had 2 prior vaginal deliveries and a nissan fundoplication.   On 12/26/16 she underwent ex lap, TAH, BSO, omentectomy, radical debulking surgery. Intraoperative findings were significant for a 20cm mass from the left ovary/tube and omental cake and 4L ascites. The only residual disease  at the completion of surgery was small 159mmilial implants on the intestines representing an optimal cytoreduction (R1). Final pathology revealed stage IIIC high grade serous carcinoma from the left fallopian tube metastatic to the ascites and omentum. In accordance with NCCN guidelines she was recommended to receive 6 cycles of adjuvant carboplatin and paclitaxel chemotherapy and referral for genetics counseling.  Chemotherapy with carboplatin and paclitaxel was completed between 12/26/16 to 05/05/17. She tolerated therapy very well. CA 125 at completion of therapy was normal at 10.6. CT abd/pelvis on 05/26/17 revealed no measurable disease. The patient was determined to have had a complete clinical response to primary therapy.  Genetic testing with Myrisk panel was negative for HRD associated mutations, therefore she was not considered for PARPi maintenance therapy .    Interval Hx: CA 125 on 08/27/17 was normal at 9.8. CA 125 on 12/17/17 was normal at 10.4. CA 125 on 03/09/18 was normal but upwardly trending at 12.1 CA 125 on 05/26/18 is pending.   She has no symptoms concerning for recurrence.    Current Meds:  Outpatient Encounter Medications as of 05/26/2018  Medication Sig  . venlafaxine XR (EFFEXOR-XR) 37.5 MG 24 hr capsule Take 1 capsule (37.5 mg total) daily with breakfast by mouth.   Facility-Administered Encounter Medications as of 05/26/2018  Medication  . sodium chloride flush (NS) 0.9 % injection 10 mL  . sodium chloride flush (NS) 0.9 % injection 10 mL    Allergy:  Allergies  Allergen Reactions  . Dilaudid [Hydromorphone Hcl] Nausea And Vomiting  .  Sudafed [Pseudoephedrine Hcl] Other (See Comments)    Nervous/jittery    Social Hx:   Social History   Socioeconomic History  . Marital status: Married    Spouse name: Holly [name]  . Number of children: 4  . Years of education: Not on file  . Highest education level: Not on file  Occupational History  . Occupation: Technical sales engineer  . Financial resource strain: Not on file  . Food insecurity:    Worry: Not on file    Inability: Not on file  . Transportation needs:    Medical: Not on file    Non-medical: Not on file  Tobacco Use  . Smoking status: Never Smoker  . Smokeless tobacco: Never Used  Substance and Sexual Activity  . Alcohol use: No  . Drug use: No  . Sexual activity: Not on file  Lifestyle  . Physical activity:    Days per week: Not on file    Minutes per session: Not on file  . Stress: Not on file  Relationships  . Social connections:    Talks on phone: Not on file    Gets together: Not on file    Attends religious service: Not on file    Active member of club or organization: Not on file    Attends meetings of clubs or organizations: Not on file    Relationship status: Not on file  . Intimate partner violence:    Fear of current or ex partner: Not on file    Emotionally abused: Not on file    Physically abused: Not on file    Forced sexual activity: Not on file  Other Topics Concern  . Not on file  Social History Narrative  . Not on file    Past Surgical Hx:  Past Surgical History:  Procedure Laterality Date  . DEBULKING N/A 12/26/2016   Procedure: RADICAL TUMOR DEBULKING; OMENTECTOMY;  Surgeon: Everitt Amber, MD;  Location: [facility];  Service: Gynecology;  Laterality: N/A;  . ganglion cyst removed    . Hital Hernia    . IR FLUORO GUIDE PORT INSERTION RIGHT  01/16/2017  . IR REMOVAL TUN ACCESS W/ PORT W/O FL MOD SED  06/10/2017  . IR US GUIDE VASC ACCESS RIGHT  01/16/2017  . LAPAROTOMY N/A 12/26/2016   Procedure: EXPLORATORY LAPAROTOMY;  Surgeon: Everitt Amber, MD;  Location: [facility];  Service: Gynecology;  Laterality: N/A;  . left morton's neuroma surgery     . Nissan Fundoplication      Past Medical Hx:  Past Medical History:  Diagnosis Date  . Complication of anesthesia    slow to wake up sometimes   . GERD (gastroesophageal reflux disease)   . History of chemotherapy    . History of hiatal hernia   . Hypertension   . Ovarian cancer ([place])   . PONV (postoperative nausea and vomiting)     Past Gynecological History:  SVD x 2 No LMP recorded. Patient has had a hysterectomy.  Family Hx:  Family History  Problem Relation Age of Onset  . Diabetes Mother   . Diabetes Father   . Hypertension Father   . Stroke Father   . COPD Father   . Heart attack Sister 57  . Dementia Maternal Aunt   . Bone cancer Paternal Uncle        deceased at 43  . Dementia Maternal Grandmother   . Heart attack Paternal Grandmother   . Heart attack Paternal Grandfather   .  Healthy Son   . Healthy Son     Review of Systems:  Constitutional  Feels well,    ENT Normal appearing ears and nares bilaterally Skin/Breast  No rash Cardiovascular  No chest pain, shortness of breath, or edema  Pulmonary  No cough or wheeze.  Gastro Intestinal  No nausea, vomitting, or diarrhoea. No bright red blood per rectum, no abdominal pain, change in bowel movement, or constipation.  Genito Urinary  No frequency, no urgency, dysuria, bleeding Musculo Skeletal  No myalgia, arthralgia, joint swelling or pain  Neurologic  No weakness, numbness, change in gait,  Psychology  No depression, anxiety, insomnia.   Vitals:  Blood pressure 139/72, pulse 97, temperature 97.9 F (36.6 C), temperature source Oral, resp. rate 18, height _0  (1.651 m), weight 185 lb (83.9 kg), SpO2 97 %.  Physical Exam: WD in NAD Neck  Supple NROM, without any enlargements.  Lymph Node Survey No cervical supraclavicular or inguinal adenopathy Cardiovascular  Pulse normal rate, regularity and rhythm. S1 and S2 normal.  Lungs  Clear to auscultation bilateraly, without wheezes/crackles/rhonchi. Good air movement.  Skin  No rash/lesions/breakdown  Psychiatry  Alert and oriented to person, place, and time  Abdomen  Normoactive bowel sounds, abdomen soft, non-tender and nonobese without evidence of hernia.  Well healed right paramedian incision, soft, no masses. Back No CVA tenderness Genito Urinary  Vulva/vagina: Normal external female genitalia.   No lesions. No discharge or bleeding. Vaginal cuff in tact and well healed. Rectal  deferred Extremities  Resolved rash   Thereasa Solo, MD  05/26/2018, 2:21 PM

## 2018-05-27 ENCOUNTER — Telehealth: Payer: Self-pay

## 2018-05-27 LAB — CA 125: Cancer Antigen (CA) 125: 13.8 U/mL (ref 0.0–38.1)

## 2018-05-27 NOTE — Telephone Encounter (Signed)
Told Ms Macioce that the CA-125 was 13.8 yesterday per [name], NP.

## 2018-06-01 DIAGNOSIS — H0289 Other specified disorders of eyelid: Secondary | ICD-10-CM | POA: Diagnosis not present

## 2018-06-01 DIAGNOSIS — S8265XD Nondisplaced fracture of lateral malleolus of left fibula, subsequent encounter for closed fracture with routine healing: Secondary | ICD-10-CM | POA: Diagnosis not present

## 2018-06-29 DIAGNOSIS — S8265XD Nondisplaced fracture of lateral malleolus of left fibula, subsequent encounter for closed fracture with routine healing: Secondary | ICD-10-CM | POA: Diagnosis not present

## 2018-07-07 ENCOUNTER — Telehealth: Payer: Self-pay | Admitting: *Deleted

## 2018-07-07 NOTE — Telephone Encounter (Signed)
Patient called and would like to speak with Melissa APP. Patient stated that "With this coronavirus we have the opinion to work from home. I was wondering if that would be best for me since I had chemotherapy and radiation." Explained that I would give Melissa APP the message and call her back. Patient can be reached [phone]

## 2018-07-08 NOTE — Telephone Encounter (Signed)
Told Holly [name] that her immune system has recovered from her past treatments. It would be good for her with the virus and her age bracket if she is offered to work from home that this would be beneficial for her health at this time. Pt verbalized understanding.

## 2018-08-20 ENCOUNTER — Other Ambulatory Visit: Payer: Self-pay | Admitting: Gynecologic Oncology

## 2018-08-20 DIAGNOSIS — N951 Menopausal and female climacteric states: Secondary | ICD-10-CM

## 2018-09-07 ENCOUNTER — Telehealth: Payer: Self-pay | Admitting: *Deleted

## 2018-09-07 NOTE — Telephone Encounter (Signed)
Called and offered the patient a virutal visit, patient declined. Called and spoke with the patient regarding her appt for tomorrow. Patient has no signs/sympotms, has not traveled and has had no exposure to COVID. Explained the new check in process, new parking process and the mask/no visitor policy.

## 2018-09-08 IMAGING — DX DG ABDOMEN 1V
2 series · 2 of 2 positions shown · non-contrast
Comparison: None.

CLINICAL DATA: Lower abdominal pain and cramping for the past 3
weeks. History of hernia repair.

EXAM:
ABDOMEN - 1 VIEW

[abdomen kub (1 of 2)]
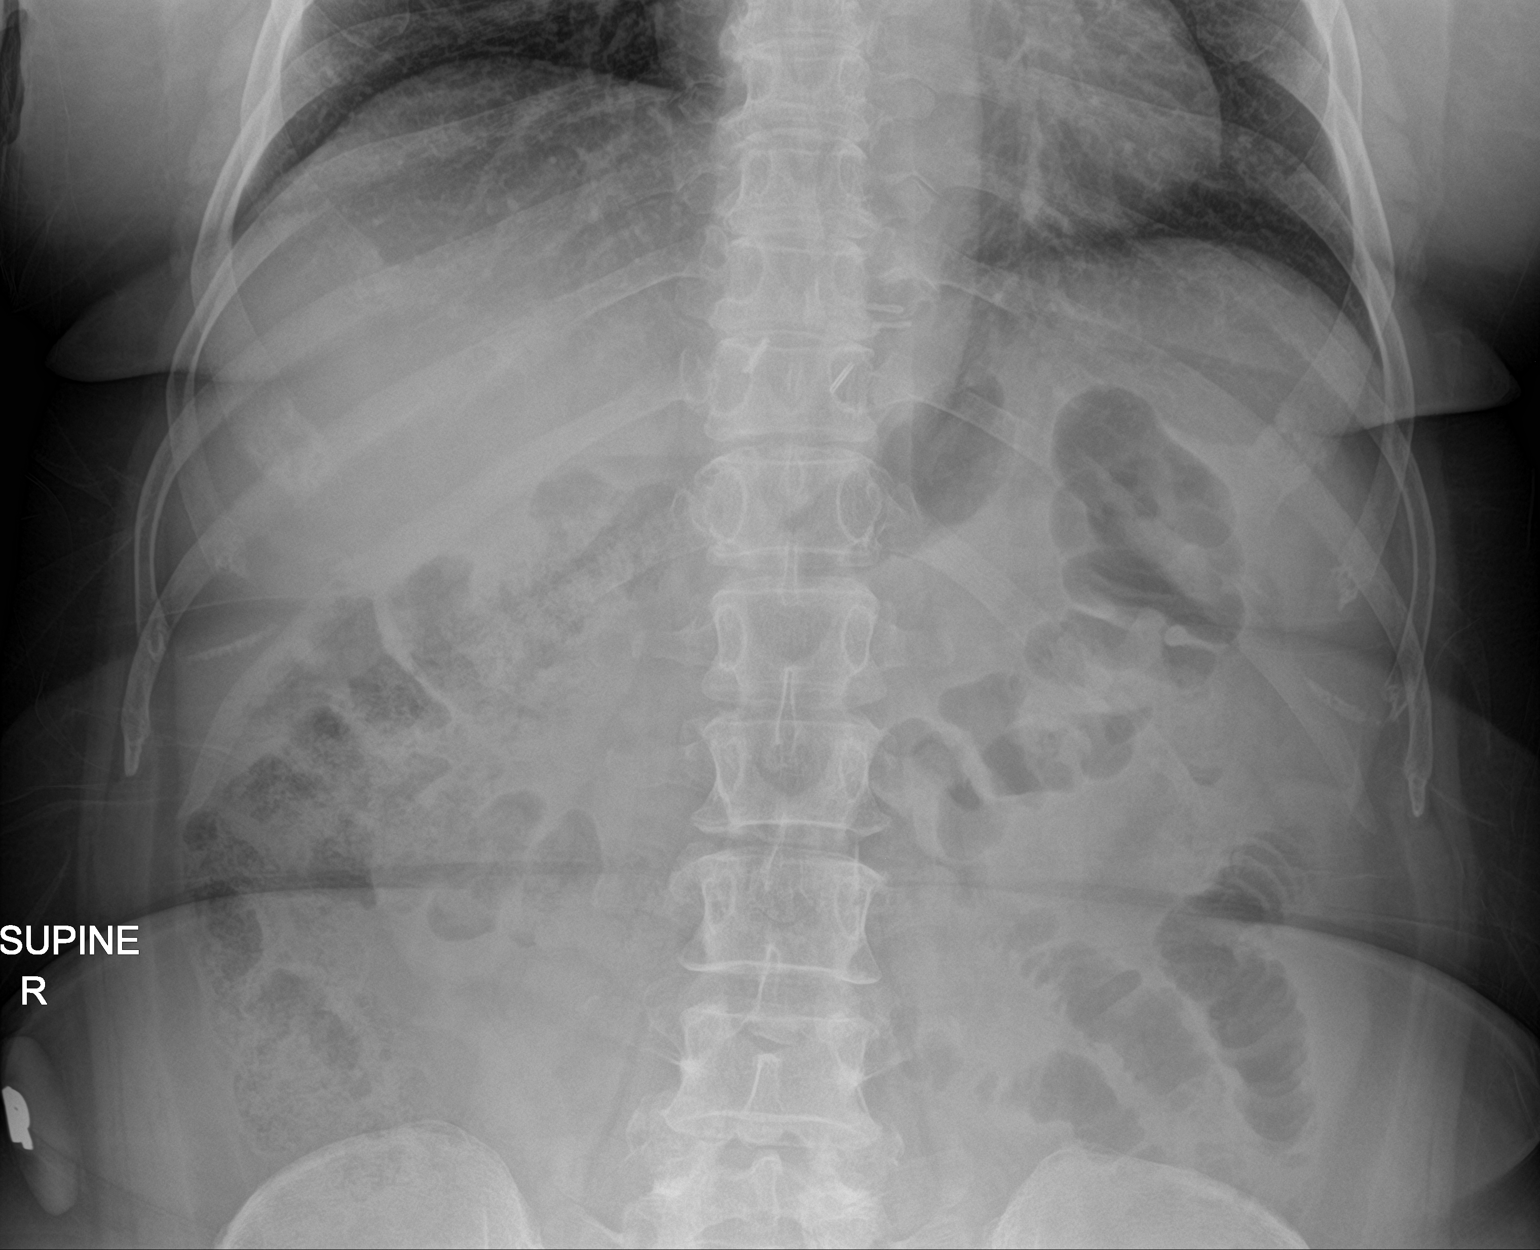

[abdomen kub (2 of 2)]
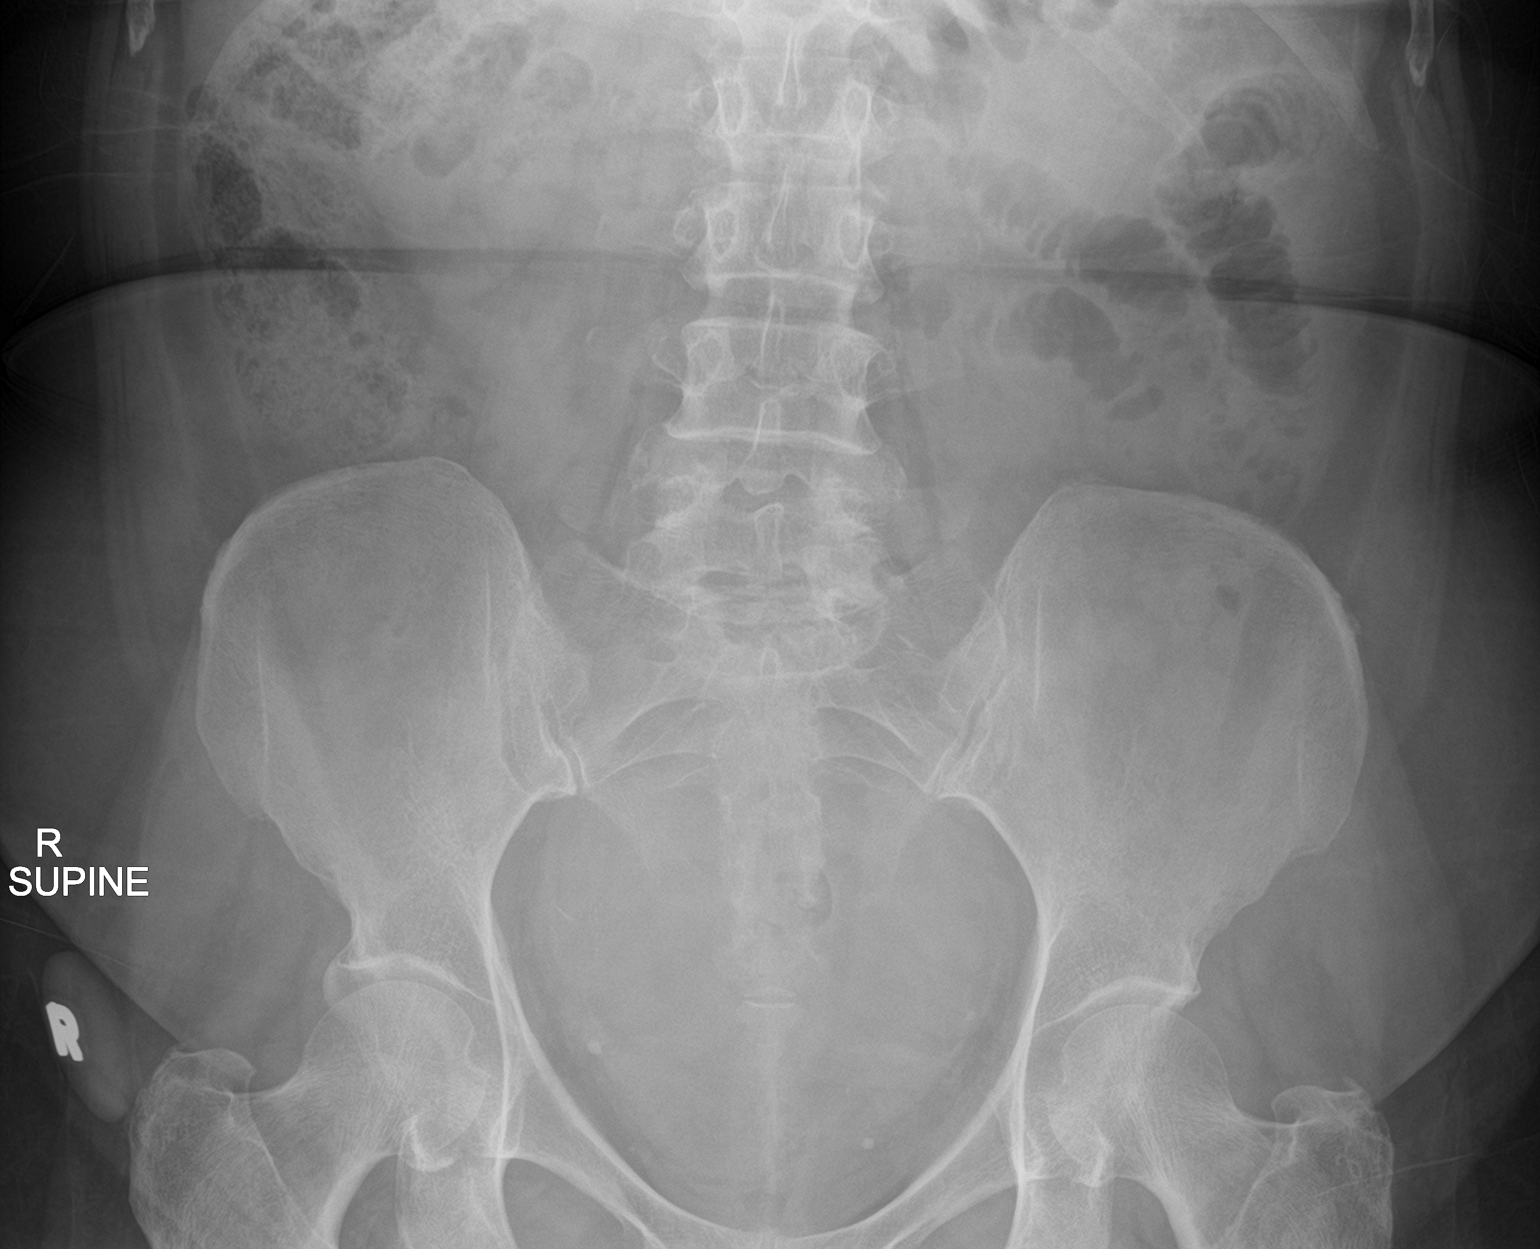

[2 of 2 positions shown; findings below may reference images not displayed]

FINDINGS: Moderate colonic stool burden without definite evidence of enteric
obstruction.

No supine evidence of pneumoperitoneum. No definite pneumatosis or
portal venous gas.

Limited visualization of the lower thorax is normal.

Punctate phleboliths overlie the lower pelvis bilaterally.
Otherwise, no definitive abnormal intra-abdominal calcifications.

No definite acute osseus abnormalities.
IMPRESSION: Moderate colonic stool burden without evidence of enteric
obstruction.

## 2018-09-09 ENCOUNTER — Inpatient Hospital Stay: Payer: BLUE CROSS/BLUE SHIELD | Attending: Gynecologic Oncology

## 2018-09-09 ENCOUNTER — Inpatient Hospital Stay (HOSPITAL_BASED_OUTPATIENT_CLINIC_OR_DEPARTMENT_OTHER): Payer: BLUE CROSS/BLUE SHIELD | Admitting: Gynecologic Oncology

## 2018-09-09 ENCOUNTER — Other Ambulatory Visit: Payer: Self-pay

## 2018-09-09 ENCOUNTER — Encounter: Payer: Self-pay | Admitting: Gynecologic Oncology

## 2018-09-09 VITALS — BP 143/65 | HR 71 | Temp 99.1°F | Resp 18 | Ht 65.0 in | Wt 187.0 lb

## 2018-09-09 DIAGNOSIS — Z90722 Acquired absence of ovaries, bilateral: Secondary | ICD-10-CM | POA: Insufficient documentation

## 2018-09-09 DIAGNOSIS — Z90710 Acquired absence of both cervix and uterus: Secondary | ICD-10-CM | POA: Insufficient documentation

## 2018-09-09 DIAGNOSIS — C569 Malignant neoplasm of unspecified ovary: Secondary | ICD-10-CM | POA: Diagnosis not present

## 2018-09-09 NOTE — Patient Instructions (Signed)
Dr Serita Grit office will contact you with your CA125 lab result.  Please notify Dr Denman George at phone number [phone] if you notice vaginal bleeding, new pelvic or abdominal pains, bloating, feeling full easy, or a change in bladder or bowel function.   Please return to see Dr Denman George in August, 2020 for a check up and lab appointment.

## 2018-09-09 NOTE — Progress Notes (Signed)
Follow-up Note: Gyn-Onc  Consult was requested by Dr. Simona Huh for the evaluation of Holly [name] 63 y.o. female  CC:  Chief Complaint  Patient presents with  . Malignant neoplasm of ovary, unspecified laterality [facility])    Assessment/Plan:  Holly [name]  is a 63 y.o.  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 S/p completion of 6 cycles adjuvant IV carb/taxol in January, 2019. BRCA/HRD negative.  Complete enduring clinical response.  Check CA 125 today. Re-evaluate for recurrence in 3 months with labs.   HPI: Holly [name] is a 63 year old parous woman who is seen in consultation at the request of Dr Simona Huh for a large pelvic mass. The patient has 3 months of vague symptoms of fullness in the pelvis, urinary frequency and difficulty with defecation. She denies vaginal bleeding or rectal bleeding.  She was seen in an urgen care in July, 2018 and a TVUS was performed on 11/13/16 which showed a large hypoechoic lobular mass seen posterior to the uterus measuring 18.2x16.1x11.8cm, blood flow was seen along the periphery of the mass. It was considered to be likely to be a fibroid vs pelvic mass vs ovarian mass. Neither ovary was removed. Moderate amount of free fluid in the pelvis. THe uterus measures 4x10x4cm. The endometrium is 36m.  An MRI was performed on 12/05/16 which showed a 13.9x11.9x18.3cm T1 hypointense central pelvic mass favoring to arise from the right ovary. No peritoneal metastases were identified in the pelvis. They favor a solid ovarian neoplasm such as a brenner tumor or fibroma.  The patient is otherwise quite helathy. She has had 2 prior vaginal deliveries and a nissan fundoplication.   On 12/26/16 she underwent ex lap, TAH, BSO, omentectomy, radical debulking surgery. Intraoperative findings were significant for a 20cm mass from the left ovary/tube and omental cake and 4L ascites. The  only residual disease at the completion of surgery was small 139mmilial implants on the intestines representing an optimal cytoreduction (R1). Final pathology revealed stage IIIC high grade serous carcinoma from the left fallopian tube metastatic to the ascites and omentum. In accordance with NCCN guidelines she was recommended to receive 6 cycles of adjuvant carboplatin and paclitaxel chemotherapy and referral for genetics counseling.  Chemotherapy with carboplatin and paclitaxel was completed between 12/26/16 to 05/05/17. She tolerated therapy very well. CA 125 at completion of therapy was normal at 10.6. CT abd/pelvis on 05/26/17 revealed no measurable disease. The patient was determined to have had a complete clinical response to primary therapy.  Genetic testing with Myrisk panel was negative for HRD associated mutations, therefore she was not considered for primary PARPi maintenance therapy .    Interval Hx: CA 125 on 08/27/17 was normal at 9.8. CA 125 on 12/17/17 was normal at 10.4. CA 125 on 03/09/18 was normal but upwardly trending at 12.1 CA 125 on 05/26/18 was upwardly trending to 13.8. CA 125 on 09/09/18 is pending   She has no symptoms concerning for recurrence.    Current Meds:  Outpatient Encounter Medications as of 09/09/2018  Medication Sig  . venlafaxine XR (EFFEXOR-XR) 37.5 MG 24 hr capsule TAKE 1 CAPSULE BY MOUTH DAILY WITH BREAKFAST   Facility-Administered Encounter Medications as of 09/09/2018  Medication  . sodium chloride flush (NS) 0.9 % injection 10 mL  . sodium chloride flush (NS) 0.9 % injection 10 mL    Allergy:  Allergies  Allergen Reactions  .  Dilaudid [Hydromorphone Hcl] Nausea And Vomiting  . Sudafed [Pseudoephedrine Hcl] Other (See Comments)    Nervous/jittery    Social Hx:   Social History   Socioeconomic History  . Marital status: Married    Spouse name: [name]  . Number of children: 4  . Years of education: Not on file  . Highest education level:  Not on file  Occupational History  . Occupation: Editor, commissioning  . Financial resource strain: Not on file  . Food insecurity:    Worry: Not on file    Inability: Not on file  . Transportation needs:    Medical: Not on file    Non-medical: Not on file  Tobacco Use  . Smoking status: Never Smoker  . Smokeless tobacco: Never Used  Substance and Sexual Activity  . Alcohol use: No  . Drug use: No  . Sexual activity: Not on file  Lifestyle  . Physical activity:    Days per week: Not on file    Minutes per session: Not on file  . Stress: Not on file  Relationships  . Social connections:    Talks on phone: Not on file    Gets together: Not on file    Attends religious service: Not on file    Active member of club or organization: Not on file    Attends meetings of clubs or organizations: Not on file    Relationship status: Not on file  . Intimate partner violence:    Fear of current or ex partner: Not on file    Emotionally abused: Not on file    Physically abused: Not on file    Forced sexual activity: Not on file  Other Topics Concern  . Not on file  Social History Narrative  . Not on file    Past Surgical Hx:  Past Surgical History:  Procedure Laterality Date  . DEBULKING N/A 12/26/2016   Procedure: RADICAL TUMOR DEBULKING; OMENTECTOMY;  Surgeon: Everitt Amber, MD;  Location: [facility];  Service: Gynecology;  Laterality: N/A;  . ganglion cyst removed    . Hital Hernia    . IR FLUORO GUIDE PORT INSERTION RIGHT  01/16/2017  . IR REMOVAL TUN ACCESS W/ PORT W/O FL MOD SED  06/10/2017  . IR US GUIDE VASC ACCESS RIGHT  01/16/2017  . LAPAROTOMY N/A 12/26/2016   Procedure: EXPLORATORY LAPAROTOMY;  Surgeon: Everitt Amber, MD;  Location: [facility];  Service: Gynecology;  Laterality: N/A;  . left morton's neuroma surgery     . Nissan Fundoplication      Past Medical Hx:  Past Medical History:  Diagnosis Date  . Complication of anesthesia    slow to wake up sometimes   . GERD  (gastroesophageal reflux disease)   . History of chemotherapy   . History of hiatal hernia   . Hypertension   . Ovarian cancer ([place])   . PONV (postoperative nausea and vomiting)     Past Gynecological History:  SVD x 2 No LMP recorded. Patient has had a hysterectomy.  Family Hx:  Family History  Problem Relation Age of Onset  . Diabetes Mother   . Diabetes Father   . Hypertension Father   . Stroke Father   . COPD Father   . Heart attack Sister 81  . Dementia Maternal Aunt   . Bone cancer Paternal Uncle        deceased at 6  . Dementia Maternal Grandmother   . Heart attack Paternal Grandmother   .  Heart attack Paternal Grandfather   . Healthy Son   . Healthy Son     Review of Systems:  Constitutional  Feels well,    ENT Normal appearing ears and nares bilaterally Skin/Breast  No rash Cardiovascular  No chest pain, shortness of breath, or edema  Pulmonary  No cough or wheeze.  Gastro Intestinal  No nausea, vomitting, or diarrhoea. No bright red blood per rectum, no abdominal pain, change in bowel movement, or constipation.  Genito Urinary  No frequency, no urgency, dysuria, bleeding Musculo Skeletal  No myalgia, arthralgia, joint swelling or pain  Neurologic  No weakness, numbness, change in gait,  Psychology  No depression, anxiety, insomnia.   Vitals:  Blood pressure (!) 143/65, pulse 71, temperature 99.1 F (37.3 C), temperature source Oral, resp. rate 18, height '5\' 5"'  (1.651 m), weight 187 lb (84.8 kg), SpO2 100 %.  Physical Exam: WD in NAD Neck  Supple NROM, without any enlargements.  Lymph Node Survey No cervical supraclavicular or inguinal adenopathy Cardiovascular  Pulse normal rate, regularity and rhythm. S1 and S2 normal.  Lungs  Clear to auscultation bilateraly, without wheezes/crackles/rhonchi. Good air movement.  Skin  No rash/lesions/breakdown  Psychiatry  Alert and oriented to person, place, and time  Abdomen  Normoactive bowel  sounds, abdomen soft, non-tender and nonobese without evidence of hernia. Well healed right paramedian incision, soft, no masses. Back No CVA tenderness Genito Urinary  Vulva/vagina: Normal external female genitalia.   No lesions. No discharge or bleeding. Vaginal cuff in tact and well healed. Rectal  deferred Extremities  Resolved rash   Thereasa Solo, MD  09/09/2018, 2:47 PM

## 2018-09-10 LAB — CA 125: Cancer Antigen (CA) 125: 23.9 U/mL (ref 0.0–38.1)

## 2018-12-18 ENCOUNTER — Inpatient Hospital Stay: Payer: BC Managed Care – PPO | Attending: Hematology and Oncology

## 2018-12-18 ENCOUNTER — Other Ambulatory Visit: Payer: Self-pay

## 2018-12-18 ENCOUNTER — Encounter: Payer: Self-pay | Admitting: Gynecologic Oncology

## 2018-12-18 ENCOUNTER — Inpatient Hospital Stay (HOSPITAL_BASED_OUTPATIENT_CLINIC_OR_DEPARTMENT_OTHER): Payer: BC Managed Care – PPO | Admitting: Gynecologic Oncology

## 2018-12-18 VITALS — BP 158/77 | HR 66 | Temp 98.0°F | Resp 16 | Ht 65.0 in | Wt 187.0 lb

## 2018-12-18 DIAGNOSIS — C569 Malignant neoplasm of unspecified ovary: Secondary | ICD-10-CM

## 2018-12-18 DIAGNOSIS — Z90710 Acquired absence of both cervix and uterus: Secondary | ICD-10-CM

## 2018-12-18 DIAGNOSIS — Z90722 Acquired absence of ovaries, bilateral: Secondary | ICD-10-CM | POA: Insufficient documentation

## 2018-12-18 DIAGNOSIS — I10 Essential (primary) hypertension: Secondary | ICD-10-CM | POA: Insufficient documentation

## 2018-12-18 DIAGNOSIS — Z9221 Personal history of antineoplastic chemotherapy: Secondary | ICD-10-CM | POA: Diagnosis not present

## 2018-12-18 DIAGNOSIS — K219 Gastro-esophageal reflux disease without esophagitis: Secondary | ICD-10-CM | POA: Diagnosis not present

## 2018-12-18 NOTE — Patient Instructions (Signed)
Please notify Dr Denman George at phone number [phone] if you notice vaginal bleeding, new pelvic or abdominal pains, bloating, feeling full easy, or a change in bladder or bowel function.   Dr Serita Grit office will call you with the results of your CA 125. If greater than 40 she will offer you a CT scan, however it is unlikely it would show any cancer with a level that low.  Please return to see her in 3 months with a CA 125 check the day before.

## 2018-12-18 NOTE — Progress Notes (Signed)
Follow-up Note: Gyn-Onc  Consult was requested by Dr. Simona Huh for the evaluation of Holly [name] 63 y.o. female  CC:  Chief Complaint  Patient presents with  . Ovarian Cancer    follow-up    Assessment/Plan:  Holly [name]  is a 63 y.o.  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 S/p completion of 6 cycles adjuvant IV carb/taxol in January, 2019. BRCA/HRD negative.  Complete enduring clinical response.  Check CA 125 today. Re-evaluate for recurrence in 3 months with labs.   HPI: Holly [name] is a 63 year old parous woman who is seen in consultation at the request of Dr Simona Huh for a large pelvic mass. The patient has 3 months of vague symptoms of fullness in the pelvis, urinary frequency and difficulty with defecation. She denies vaginal bleeding or rectal bleeding.  She was seen in an urgen care in July, 2018 and a TVUS was performed on 11/13/16 which showed a large hypoechoic lobular mass seen posterior to the uterus measuring 18.2x16.1x11.8cm, blood flow was seen along the periphery of the mass. It was considered to be likely to be a fibroid vs pelvic mass vs ovarian mass. Neither ovary was removed. Moderate amount of free fluid in the pelvis. THe uterus measures 4x10x4cm. The endometrium is 28m.  An MRI was performed on 12/05/16 which showed a 13.9x11.9x18.3cm T1 hypointense central pelvic mass favoring to arise from the right ovary. No peritoneal metastases were identified in the pelvis. They favor a solid ovarian neoplasm such as a brenner tumor or fibroma.  The patient is otherwise quite helathy. She has had 2 prior vaginal deliveries and a nissan fundoplication.   On 12/26/16 she underwent ex lap, TAH, BSO, omentectomy, radical debulking surgery. Intraoperative findings were significant for a 20cm mass from the left ovary/tube and omental cake and 4L ascites. The only residual disease at the  completion of surgery was small 110mmilial implants on the intestines representing an optimal cytoreduction (R1). Final pathology revealed stage IIIC high grade serous carcinoma from the left fallopian tube metastatic to the ascites and omentum. In accordance with NCCN guidelines she was recommended to receive 6 cycles of adjuvant carboplatin and paclitaxel chemotherapy and referral for genetics counseling.  Chemotherapy with carboplatin and paclitaxel was completed between 12/26/16 to 05/05/17. She tolerated therapy very well. CA 125 at completion of therapy was normal at 10.6. CT abd/pelvis on 05/26/17 revealed no measurable disease. The patient was determined to have had a complete clinical response to primary therapy.  Genetic testing with Myrisk panel was negative for HRD associated mutations, therefore she was not considered for primary PARPi maintenance therapy .    Interval Hx: CA 125 on 08/27/17 was normal at 9.8. CA 125 on 12/17/17 was normal at 10.4. CA 125 on 03/09/18 was normal but upwardly trending at 12.1 CA 125 on 05/26/18 was upwardly trending to 13.8. CA 125 on 09/09/18 was 23.9 CA 125 on 12/18/18 was pending  She has no symptoms concerning for recurrence.    Current Meds:  Outpatient Encounter Medications as of 12/18/2018  Medication Sig  . venlafaxine XR (EFFEXOR-XR) 37.5 MG 24 hr capsule TAKE 1 CAPSULE BY MOUTH DAILY WITH BREAKFAST   Facility-Administered Encounter Medications as of 12/18/2018  Medication  . sodium chloride flush (NS) 0.9 % injection 10 mL  . sodium chloride flush (NS) 0.9 % injection 10 mL    Allergy:  Allergies  Allergen Reactions  . Dilaudid [Hydromorphone Hcl] Nausea And Vomiting  . Sudafed [Pseudoephedrine Hcl] Other (See Comments)    Nervous/jittery    Social Hx:   Social History   Socioeconomic History  . Marital status: Married    Spouse name: [name]  . Number of children: 4  . Years of education: Not on file  . Highest education level:  Not on file  Occupational History  . Occupation: Editor, commissioning  . Financial resource strain: Not on file  . Food insecurity    Worry: Not on file    Inability: Not on file  . Transportation needs    Medical: Not on file    Non-medical: Not on file  Tobacco Use  . Smoking status: Never Smoker  . Smokeless tobacco: Never Used  Substance and Sexual Activity  . Alcohol use: No  . Drug use: No  . Sexual activity: Not on file  Lifestyle  . Physical activity    Days per week: Not on file    Minutes per session: Not on file  . Stress: Not on file  Relationships  . Social Herbalist on phone: Not on file    Gets together: Not on file    Attends religious service: Not on file    Active member of club or organization: Not on file    Attends meetings of clubs or organizations: Not on file    Relationship status: Not on file  . Intimate partner violence    Fear of current or ex partner: Not on file    Emotionally abused: Not on file    Physically abused: Not on file    Forced sexual activity: Not on file  Other Topics Concern  . Not on file  Social History Narrative  . Not on file    Past Surgical Hx:  Past Surgical History:  Procedure Laterality Date  . DEBULKING N/A 12/26/2016   Procedure: RADICAL TUMOR DEBULKING; OMENTECTOMY;  Surgeon: Everitt Amber, MD;  Location: [facility];  Service: Gynecology;  Laterality: N/A;  . ganglion cyst removed    . Hital Hernia    . IR FLUORO GUIDE PORT INSERTION RIGHT  01/16/2017  . IR REMOVAL TUN ACCESS W/ PORT W/O FL MOD SED  06/10/2017  . IR US GUIDE VASC ACCESS RIGHT  01/16/2017  . LAPAROTOMY N/A 12/26/2016   Procedure: EXPLORATORY LAPAROTOMY;  Surgeon: Everitt Amber, MD;  Location: [facility];  Service: Gynecology;  Laterality: N/A;  . left morton's neuroma surgery     . Nissan Fundoplication      Past Medical Hx:  Past Medical History:  Diagnosis Date  . Complication of anesthesia    slow to wake up sometimes   . GERD  (gastroesophageal reflux disease)   . History of chemotherapy   . History of hiatal hernia   . Hypertension   . Ovarian cancer ([place])   . PONV (postoperative nausea and vomiting)     Past Gynecological History:  SVD x 2 No LMP recorded. Patient has had a hysterectomy.  Family Hx:  Family History  Problem Relation Age of Onset  . Diabetes Mother   . Diabetes Father   . Hypertension Father   . Stroke Father   . COPD Father   . Heart attack Sister 30  . Dementia Maternal Aunt   . Bone cancer Paternal Uncle        deceased at 32  . Dementia Maternal Grandmother   . Heart  attack Paternal Grandmother   . Heart attack Paternal Grandfather   . Healthy Son   . Healthy Son     Review of Systems:  Constitutional  Feels well,    ENT Normal appearing ears and nares bilaterally Skin/Breast  No rash Cardiovascular  No chest pain, shortness of breath, or edema  Pulmonary  No cough or wheeze.  Gastro Intestinal  No nausea, vomitting, or diarrhoea. No bright red blood per rectum, no abdominal pain, change in bowel movement, or constipation.  Genito Urinary  No frequency, no urgency, dysuria, bleeding Musculo Skeletal  No myalgia, arthralgia, joint swelling or pain  Neurologic  No weakness, numbness, change in gait,  Psychology  No depression, anxiety, insomnia.   Vitals:  Blood pressure (!) 158/77, pulse 66, temperature 98 F (36.7 C), temperature source Tympanic, resp. rate 16, height _0  (1.651 m), weight 187 lb (84.8 kg), SpO2 100 %.  Physical Exam: WD in NAD Neck  Supple NROM, without any enlargements.  Lymph Node Survey No cervical supraclavicular or inguinal adenopathy Cardiovascular  Pulse normal rate, regularity and rhythm. S1 and S2 normal.  Lungs  Clear to auscultation bilateraly, without wheezes/crackles/rhonchi. Good air movement.  Skin  No rash/lesions/breakdown  Psychiatry  Alert and oriented to person, place, and time  Abdomen  Normoactive bowel  sounds, abdomen soft, non-tender and nonobese without evidence of hernia. Well healed right paramedian incision, soft, no masses. Back No CVA tenderness Genito Urinary  Vulva/vagina: Normal external female genitalia.   No lesions. No discharge or bleeding. Vaginal cuff in tact and well healed. Rectal  deferred Extremities  Resolved rash   Thereasa Solo, MD  12/18/2018, 1:36 PM

## 2018-12-19 LAB — CA 125: Cancer Antigen (CA) 125: 43.8 U/mL — ABNORMAL HIGH (ref 0.0–38.1)

## 2018-12-21 ENCOUNTER — Telehealth: Payer: Self-pay

## 2018-12-21 NOTE — Telephone Encounter (Signed)
Told Ms Luckman that her CA-125 from 12-18-18 was 43.8.  The marker is over 40 and Dr. Denman George noted at visit that A CT of her abdomen and pelvis  Could be arranged.  Dr. Denman George feels that the marker is still low enough that a CT scan would not show any cancer. Dr. Denman George would order scan if that is what Ms Toma prefers, other wise she can follow up in 3 months with a CA-125 and visit. Ms Murley is comfortable with repeating labs in 3 months with a visit. Appointment is scheduled for 03-05-19  for lab and 03-09-19 for visit

## 2018-12-30 DIAGNOSIS — L308 Other specified dermatitis: Secondary | ICD-10-CM | POA: Diagnosis not present

## 2018-12-30 DIAGNOSIS — L570 Actinic keratosis: Secondary | ICD-10-CM | POA: Diagnosis not present

## 2018-12-30 DIAGNOSIS — L0889 Other specified local infections of the skin and subcutaneous tissue: Secondary | ICD-10-CM | POA: Diagnosis not present

## 2018-12-30 DIAGNOSIS — L011 Impetiginization of other dermatoses: Secondary | ICD-10-CM | POA: Diagnosis not present

## 2019-02-15 ENCOUNTER — Other Ambulatory Visit: Payer: BC Managed Care – PPO

## 2019-02-17 ENCOUNTER — Ambulatory Visit: Payer: BC Managed Care – PPO | Admitting: Gynecologic Oncology

## 2019-02-26 ENCOUNTER — Telehealth: Payer: Self-pay

## 2019-02-26 DIAGNOSIS — R399 Unspecified symptoms and signs involving the genitourinary system: Secondary | ICD-10-CM | POA: Diagnosis not present

## 2019-02-26 NOTE — Telephone Encounter (Signed)
Holly [name] has symptoms of UTI. She also call her PCP who was able to work her in and she gave a urine specimen. PCP will folow up with Culture results.  No ATB given with UA results per patient.

## 2019-02-26 NOTE — Telephone Encounter (Signed)
Returning patient call. LM to call back to the office.

## 2019-02-27 ENCOUNTER — Encounter (HOSPITAL_COMMUNITY): Payer: Self-pay | Admitting: Emergency Medicine

## 2019-02-27 ENCOUNTER — Emergency Department (HOSPITAL_COMMUNITY): Payer: BC Managed Care – PPO

## 2019-02-27 ENCOUNTER — Other Ambulatory Visit: Payer: Self-pay

## 2019-02-27 ENCOUNTER — Inpatient Hospital Stay (HOSPITAL_COMMUNITY)
Admission: EM | Admit: 2019-02-27 | Discharge: 2019-02-28 | DRG: 375 | Disposition: A | Discharge status: Home / Self Care | Payer: BC Managed Care – PPO | Attending: Family Medicine | Admitting: Family Medicine

## 2019-02-27 DIAGNOSIS — C7B04 Secondary carcinoid tumors of peritoneum: Secondary | ICD-10-CM | POA: Diagnosis not present

## 2019-02-27 DIAGNOSIS — Z79899 Other long term (current) drug therapy: Secondary | ICD-10-CM | POA: Diagnosis not present

## 2019-02-27 DIAGNOSIS — Z885 Allergy status to narcotic agent status: Secondary | ICD-10-CM

## 2019-02-27 DIAGNOSIS — Z8249 Family history of ischemic heart disease and other diseases of the circulatory system: Secondary | ICD-10-CM | POA: Diagnosis not present

## 2019-02-27 DIAGNOSIS — Z833 Family history of diabetes mellitus: Secondary | ICD-10-CM | POA: Diagnosis not present

## 2019-02-27 DIAGNOSIS — K56609 Unspecified intestinal obstruction, unspecified as to partial versus complete obstruction: Secondary | ICD-10-CM | POA: Diagnosis not present

## 2019-02-27 DIAGNOSIS — Z823 Family history of stroke: Secondary | ICD-10-CM

## 2019-02-27 DIAGNOSIS — K5669 Other partial intestinal obstruction: Secondary | ICD-10-CM | POA: Diagnosis not present

## 2019-02-27 DIAGNOSIS — Z03818 Encounter for observation for suspected exposure to other biological agents ruled out: Secondary | ICD-10-CM | POA: Diagnosis not present

## 2019-02-27 DIAGNOSIS — Z825 Family history of asthma and other chronic lower respiratory diseases: Secondary | ICD-10-CM

## 2019-02-27 DIAGNOSIS — I10 Essential (primary) hypertension: Secondary | ICD-10-CM | POA: Diagnosis present

## 2019-02-27 DIAGNOSIS — R300 Dysuria: Secondary | ICD-10-CM | POA: Diagnosis present

## 2019-02-27 DIAGNOSIS — Z0189 Encounter for other specified special examinations: Secondary | ICD-10-CM | POA: Diagnosis not present

## 2019-02-27 DIAGNOSIS — Z20828 Contact with and (suspected) exposure to other viral communicable diseases: Secondary | ICD-10-CM | POA: Diagnosis present

## 2019-02-27 DIAGNOSIS — K8020 Calculus of gallbladder without cholecystitis without obstruction: Secondary | ICD-10-CM | POA: Diagnosis not present

## 2019-02-27 DIAGNOSIS — Z90710 Acquired absence of both cervix and uterus: Secondary | ICD-10-CM | POA: Diagnosis not present

## 2019-02-27 DIAGNOSIS — Z8543 Personal history of malignant neoplasm of ovary: Secondary | ICD-10-CM | POA: Diagnosis not present

## 2019-02-27 DIAGNOSIS — Z9221 Personal history of antineoplastic chemotherapy: Secondary | ICD-10-CM

## 2019-02-27 DIAGNOSIS — R1900 Intra-abdominal and pelvic swelling, mass and lump, unspecified site: Secondary | ICD-10-CM | POA: Diagnosis not present

## 2019-02-27 DIAGNOSIS — C787 Secondary malignant neoplasm of liver and intrahepatic bile duct: Secondary | ICD-10-CM | POA: Diagnosis not present

## 2019-02-27 DIAGNOSIS — K219 Gastro-esophageal reflux disease without esophagitis: Secondary | ICD-10-CM | POA: Diagnosis present

## 2019-02-27 LAB — CBC WITH DIFFERENTIAL/PLATELET
Abs Immature Granulocytes: 0.03 10*3/uL (ref 0.00–0.07)
Basophils Absolute: 0.1 10*3/uL (ref 0.0–0.1)
Basophils Relative: 1 %
Eosinophils Absolute: 0.1 10*3/uL (ref 0.0–0.5)
Eosinophils Relative: 1 %
HCT: 44 % (ref 36.0–46.0)
Hemoglobin: 14.8 g/dL (ref 12.0–15.0)
Immature Granulocytes: 0 %
Lymphocytes Relative: 14 %
Lymphs Abs: 1.3 10*3/uL (ref 0.7–4.0)
MCH: 31.4 pg (ref 26.0–34.0)
MCHC: 33.6 g/dL (ref 30.0–36.0)
MCV: 93.4 fL (ref 80.0–100.0)
Monocytes Absolute: 0.5 10*3/uL (ref 0.1–1.0)
Monocytes Relative: 6 %
Neutro Abs: 7.4 10*3/uL (ref 1.7–7.7)
Neutrophils Relative %: 78 %
Platelets: 229 10*3/uL (ref 150–400)
RBC: 4.71 MIL/uL (ref 3.87–5.11)
RDW: 12.2 % (ref 11.5–15.5)
WBC: 9.3 10*3/uL (ref 4.0–10.5)
nRBC: 0 % (ref 0.0–0.2)

## 2019-02-27 LAB — URINALYSIS, ROUTINE W REFLEX MICROSCOPIC
Bacteria, UA: NONE SEEN
Bilirubin Urine: NEGATIVE
Glucose, UA: NEGATIVE mg/dL
Ketones, ur: NEGATIVE mg/dL
Leukocytes,Ua: NEGATIVE
Nitrite: NEGATIVE
Protein, ur: 30 mg/dL — AB
Specific Gravity, Urine: 1.018 (ref 1.005–1.030)
pH: 7 (ref 5.0–8.0)

## 2019-02-27 LAB — COMPREHENSIVE METABOLIC PANEL
ALT: 20 U/L (ref 0–44)
AST: 22 U/L (ref 15–41)
Albumin: 4 g/dL (ref 3.5–5.0)
Alkaline Phosphatase: 76 U/L (ref 38–126)
Anion gap: 13 (ref 5–15)
BUN: 14 mg/dL (ref 8–23)
CO2: 21 mmol/L — ABNORMAL LOW (ref 22–32)
Calcium: 9.2 mg/dL (ref 8.9–10.3)
Chloride: 104 mmol/L (ref 98–111)
Creatinine, Ser: 0.84 mg/dL (ref 0.44–1.00)
GFR calc Af Amer: >60 mL/min (ref >60)
GFR calc non Af Amer: >60 mL/min (ref >60)
Glucose, Bld: 144 mg/dL — ABNORMAL HIGH (ref 70–99)
Potassium: 4.1 mmol/L (ref 3.5–5.1)
Sodium: 138 mmol/L (ref 135–145)
Total Bilirubin: 0.8 mg/dL (ref 0.3–1.2)
Total Protein: 7.1 g/dL (ref 6.5–8.1)

## 2019-02-27 LAB — SARS CORONAVIRUS 2 (TAT 6-24 HRS): SARS Coronavirus 2: NEGATIVE

## 2019-02-27 LAB — HIV ANTIBODY (ROUTINE TESTING W REFLEX): HIV Screen 4th Generation wRfx: NONREACTIVE

## 2019-02-27 LAB — LIPASE, BLOOD: Lipase: 24 U/L (ref 11–51)

## 2019-02-27 MED ORDER — FENTANYL CITRATE (PF) 100 MCG/2ML IJ SOLN
25.0000 ug | INTRAMUSCULAR | Status: DC | PRN
  Administered 2019-02-28: 25 ug via INTRAVENOUS
  Filled 2019-02-27: qty 2

## 2019-02-27 MED ORDER — DEXTROSE-NACL 5-0.45 % IV SOLN
INTRAVENOUS | Status: DC
  Administered 2019-02-27 – 2019-02-28 (×2): via INTRAVENOUS

## 2019-02-27 MED ORDER — DIATRIZOATE MEGLUMINE & SODIUM 66-10 % PO SOLN
90.0000 mL | Freq: Once | ORAL | Status: AC
  Administered 2019-02-27: 90 mL via NASOGASTRIC
  Filled 2019-02-27: qty 90

## 2019-02-27 MED ORDER — IOHEXOL 300 MG/ML  SOLN
100.0000 mL | Freq: Once | INTRAMUSCULAR | Status: AC | PRN
  Administered 2019-02-27: 11:00:00 100 mL via INTRAVENOUS

## 2019-02-27 MED ORDER — MORPHINE SULFATE (PF) 2 MG/ML IV SOLN
2.0000 mg | INTRAVENOUS | Status: DC | PRN
  Administered 2019-02-27: 2 mg via INTRAVENOUS
  Filled 2019-02-27: qty 1

## 2019-02-27 MED ORDER — FENTANYL CITRATE (PF) 100 MCG/2ML IJ SOLN
50.0000 ug | Freq: Once | INTRAMUSCULAR | Status: AC
  Administered 2019-02-27: 50 ug via INTRAVENOUS
  Filled 2019-02-27: qty 2

## 2019-02-27 MED ORDER — ONDANSETRON HCL 4 MG/2ML IJ SOLN
4.0000 mg | Freq: Four times a day (QID) | INTRAMUSCULAR | Status: DC | PRN
  Administered 2019-02-27: 4 mg via INTRAVENOUS
  Filled 2019-02-27: qty 2

## 2019-02-27 MED ORDER — SODIUM CHLORIDE 0.9 % IV BOLUS
1000.0000 mL | Freq: Once | INTRAVENOUS | Status: AC
  Administered 2019-02-27: 10:00:00 1000 mL via INTRAVENOUS

## 2019-02-27 MED ORDER — ENOXAPARIN SODIUM 40 MG/0.4ML ~~LOC~~ SOLN
40.0000 mg | SUBCUTANEOUS | Status: DC
  Administered 2019-02-27: 40 mg via SUBCUTANEOUS
  Filled 2019-02-27: qty 0.4

## 2019-02-27 MED ORDER — LIDOCAINE HCL URETHRAL/MUCOSAL 2 % EX GEL
1.0000 "application " | Freq: Once | CUTANEOUS | Status: DC
  Filled 2019-02-27: qty 20

## 2019-02-27 MED ORDER — FENTANYL CITRATE (PF) 100 MCG/2ML IJ SOLN
25.0000 ug | Freq: Once | INTRAMUSCULAR | Status: DC
  Filled 2019-02-27: qty 2

## 2019-02-27 MED ORDER — ONDANSETRON HCL 4 MG/2ML IJ SOLN
4.0000 mg | Freq: Once | INTRAMUSCULAR | Status: AC
  Administered 2019-02-27: 4 mg via INTRAVENOUS
  Filled 2019-02-27: qty 2

## 2019-02-27 MED ORDER — ONDANSETRON HCL 4 MG PO TABS: 4.0000 mg | ORAL_TABLET | Freq: Four times a day (QID) | ORAL | Status: DC | PRN

## 2019-02-27 NOTE — H&P (Signed)
[facility]                                                                                    Patient Demographics  Holly [name], is a 63 y.o. female  CSN: [id number]  MRN: [id number]  DOB - 06/23/1955  Admit Date - 02/27/2019  Outpatient Primary MD for the patient is Holly Arabian, MD   With History of -  Past Medical History:  Diagnosis Date  . Complication of anesthesia    slow to wake up sometimes   . GERD (gastroesophageal reflux disease)   . History of chemotherapy   . History of hiatal hernia   . Hypertension   . Ovarian cancer ([place])   . PONV (postoperative nausea and vomiting)       Past Surgical History:  Procedure Laterality Date  . DEBULKING N/A 12/26/2016   Procedure: RADICAL TUMOR DEBULKING; OMENTECTOMY;  Surgeon: Everitt Amber, MD;  Location: [facility];  Service: Gynecology;  Laterality: N/A;  . ganglion cyst removed    . Hital Hernia    . IR FLUORO GUIDE PORT INSERTION RIGHT  01/16/2017  . IR REMOVAL TUN ACCESS W/ PORT W/O FL MOD SED  06/10/2017  . IR US GUIDE VASC ACCESS RIGHT  01/16/2017  . LAPAROTOMY N/A 12/26/2016   Procedure: EXPLORATORY LAPAROTOMY;  Surgeon: Everitt Amber, MD;  Location: [facility];  Service: Gynecology;  Laterality: N/A;  . left morton's neuroma surgery     . Nissan Fundoplication      in for   Chief Complaint  Patient presents with  . Emesis  . Dysuria     HPI  Holly [name]  is a 63 y.o. female, with past medical history significant for ovarian cancer status post exploratory laparotomy, total abdominal hysterectomy, BSO, omentectomy and radical debulking in 2018 presenting today with 1 week history of abdominal pain with nausea and vomiting that started at 7 AM this morning.  No history of constipation or diarrhea Work-up in the emergency room revealed high grade small bowel obstruction with mesenteric spiculated mass with liver mets .  Her CBC and BMP were within normal. General surgery, Dr. Rosendo Gros consulted and an NG  tube was placed. Patient usually follows with Dr. Denman George.     Review of Systems    In addition to the HPI above, No Fever-chills, No Headache, No changes with Vision or hearing, No problems swallowing food or Liquids, No Chest pain, Cough or Shortness of Breath,  Bowel movements are regular, No Blood in stool or Urine, No dysuria, No new skin rashes or bruises, No new joints pains-aches,  No new weakness, tingling, numbness in any extremity, No recent weight gain or loss, No polyuria, polydypsia or polyphagia, No significant Mental Stressors.  All other systems were reviewed and were negative.   Social History Social History   Tobacco Use  . Smoking status: Never Smoker  . Smokeless tobacco: Never Used  Substance Use Topics  . Alcohol use: No     Family History Family History  Problem Relation Age of Onset  . Diabetes Mother   . Diabetes Father   . Hypertension Father   . Stroke Father   .  COPD Father   . Heart attack Sister 51  . Dementia Maternal Aunt   . Bone cancer Paternal Uncle        deceased at 35  . Dementia Maternal Grandmother   . Heart attack Paternal Grandmother   . Heart attack Paternal Grandfather   . Healthy Son   . Healthy Son      Prior to Admission medications   Medication Sig Start Date End Date Taking? Authorizing Provider  acetaminophen (TYLENOL) 325 MG tablet Take 975 mg by mouth every 6 (six) hours as needed (pain).   Yes [provider]  augmented betamethasone dipropionate (DIPROLENE-AF) 0.05 % cream Apply 1 application topically daily as needed (dermatitis).  10/20/18  Yes [provider]  triamcinolone cream (KENALOG) 0.1 % Apply 1 application topically daily as needed (dermatitis).  01/22/19  Yes [provider]  venlafaxine XR (EFFEXOR-XR) 37.5 MG 24 hr capsule TAKE 1 CAPSULE BY MOUTH DAILY WITH BREAKFAST Patient taking differently: Take 37.5 mg by mouth daily with breakfast.  08/20/18  Yes Cross,  Melissa D, NP    Allergies  Allergen Reactions  . Dilaudid [Hydromorphone Hcl] Nausea And Vomiting  . Sudafed [Pseudoephedrine Hcl] Other (See Comments)    Nervous/jittery    Physical Exam  Vitals  Blood pressure (!) 154/81, pulse 75, temperature 97.8 F (36.6 C), temperature source Oral, resp. rate 18, height 5\' 5"  (1.651 m), weight 82.6 kg, SpO2 96 %. General appearance; extremely pleasant female, well-developed, well-nourished in no acute distress HEENT no jaundice or pallor, no facial deviation oral thrush Neck supple, no neck vein distention Chest clear and resonant Heart normal S1-S2, no murmurs gallops or rubs Abdomen mildly distended though soft.  No abdominal tenderness noted, bowel sounds are present Extremities no clubbing cyanosis or edema Skin warm, no rashes noted    Data Review  CBC Recent Labs  Lab 02/27/19 1012  WBC 9.3  HGB 14.8  HCT 44.0  PLT 229  MCV 93.4  MCH 31.4  MCHC 33.6  RDW 12.2  LYMPHSABS 1.3  MONOABS 0.5  EOSABS 0.1  BASOSABS 0.1   ------------------------------------------------------------------------------------------------------------------  Chemistries  Recent Labs  Lab 02/27/19 1012  NA 138  K 4.1  CL 104  CO2 21*  GLUCOSE 144*  BUN 14  CREATININE 0.84  CALCIUM 9.2  AST 22  ALT 20  ALKPHOS 76  BILITOT 0.8   ------------------------------------------------------------------------------------------------------------------ estimated creatinine clearance is 71.8 mL/min (by C-G formula based on SCr of 0.84 mg/dL). ------------------------------------------------------------------------------------------------------------------ No results for input(s): TSH, T4TOTAL, T3FREE, THYROIDAB in the last 72 hours.  Invalid input(s): FREET3   Coagulation profile No results for input(s): INR, PROTIME in the last 168  hours. ------------------------------------------------------------------------------------------------------------------- No results for input(s): DDIMER in the last 72 hours. -------------------------------------------------------------------------------------------------------------------  Cardiac Enzymes No results for input(s): CKMB, TROPONINI, MYOGLOBIN in the last 168 hours.  Invalid input(s): CK ------------------------------------------------------------------------------------------------------------------ Invalid input(s): POCBNP   ---------------------------------------------------------------------------------------------------------------  Urinalysis    Component Value Date/Time   COLORURINE STRAW (A) 02/27/2019 1148   APPEARANCEUR CLEAR 02/27/2019 1148   LABSPEC 1.018 02/27/2019 1148   PHURINE 7.0 02/27/2019 1148   GLUCOSEU NEGATIVE 02/27/2019 1148   HGBUR SMALL (A) 02/27/2019 1148   BILIRUBINUR NEGATIVE 02/27/2019 1148   KETONESUR NEGATIVE 02/27/2019 1148   PROTEINUR 30 (A) 02/27/2019 1148   UROBILINOGEN 0.2 08/12/2016 2019   NITRITE NEGATIVE 02/27/2019 1148   LEUKOCYTESUR NEGATIVE 02/27/2019 1148    ----------------------------------------------------------------------------------------------------------------   Imaging results:   Ct Abdomen Pelvis W Contrast  Result Date:  02/27/2019 CLINICAL DATA:  Epigastric and suprapubic pain.  Dysuria. EXAM: CT ABDOMEN AND PELVIS WITH CONTRAST TECHNIQUE: Multidetector CT imaging of the abdomen and pelvis was performed using the standard protocol following bolus administration of intravenous contrast. CONTRAST:  148mL OMNIPAQUE IOHEXOL 300 MG/ML  SOLN COMPARISON:  CT abdomen pelvis dated May 26, 2017. FINDINGS: Lower chest: No acute abnormality. Hepatobiliary: There are multiple new small subcentimeter hyperdense lesions scattered along the liver capsular surface. Small gallstone. No gallbladder wall thickening or  biliary dilatation. Pancreas: Unremarkable. No pancreatic ductal dilatation or surrounding inflammatory changes. Spleen: Normal in size without focal abnormality. Adrenals/Urinary Tract: Adrenal glands are unremarkable. Very mild right hydroureteronephrosis. Mild fullness of the left renal collecting system and ureter. No renal or ureteral calculi. The bladder is unremarkable. Stomach/Bowel: Unchanged small hiatal hernia with surgical clips near the gastroesophageal junction. The stomach is otherwise within normal limits. There are several borderline and mildly dilated loops of small bowel with a transition point in the pelvis, where there is a suggestion of a desmoplastic reaction surrounding a 1.3 cm spiculated nodule in the mesentery (series 6, image 50). The appendix is at the upper limits of normal in size with hyperenhancement near its tip and loss of the normal flat plane between it and the adjacent sigmoid colon (series 6, images 59-61). Infraumbilical ventral abdominal wall diastasis containing a small nondilated portion of transverse colon and a small amount of fluid. The colon is otherwise unremarkable. Vascular/Lymphatic: Aortic atherosclerosis. Enlarged hyperenhancing gastrohepatic and portacaval lymph nodes measuring up to 1.1 cm in short axis. Mildly enlarged bilateral inguinal lymph nodes measuring up to 1.3 cm in short axis. Reproductive: Status post hysterectomy. No adnexal masses. Other: Trace ascites in the pelvis.  No pneumoperitoneum. Musculoskeletal: No acute or significant osseous findings. IMPRESSION: 1. High-grade small bowel obstruction with transition point in the pelvis in an area of suspected desmoplastic reaction surrounding a 1.3 cm spiculated nodule in the mesentery, suspicious for carcinoid. There is hyperenhancement near the tip of the appendix and loss of the normal fat plane between it and the adjacent sigmoid colon, potentially the site of primary lesion. 2. Multiple new small  subcentimeter hyperdense lesions scattered along the liver capsule, concerning for metastases. Enlarged hyperenhancing gastrohepatic and portacaval lymph nodes concerning for nodal metastases. 3. Very mild right hydroureteronephrosis to the level of the desmoplastic reaction in the pelvis, potentially involving the right ureter. 4. Infraumbilical ventral abdominal wall diastasis containing a small nondilated portion of transverse colon. 5. Trace ascites. 6. Cholelithiasis. 7.  Aortic atherosclerosis (ICD10-I70.0). Electronically Signed   By: Titus Dubin M.D.   On: 02/27/2019 12:22      Assessment & Plan  Small bowel obstruction?  Mechanical NG tube placed Continue with IV fluids Check BMP in a.m.  Mesenteric mass with liver mets Patient has history of ovarian cancer status post debulking Usually follows with Dr. Denman George, last surgery in 2018 Dr. Rosendo Gros on consult  Note: Dr. Rosendo Gros decided to hold on on the NG tube for now.  Follow-up with imaging again in a.m.  DVT Prophylaxis Lovenox  AM Labs Ordered, also please review Full Orders  Family Communication: Discussed with husband at bedside.  Code Status full  Disposition Plan: Home  Time spent in minutes : More than 40 minutes Condition GUARDED   @SIGNATURE @

## 2019-02-27 NOTE — Consult Note (Addendum)
Reason for Consult: Small bowel obstruction Referring Physician: Dr. Vanessa Kick is an 63 y.o. female.  HPI: 62 year old female with a history of ovarian cancer status post total abdominal hysterectomy, BSO, omentectomy, radical debulking in September 2018 by Dr. Denman George, history of hiatal hernia repair in the past. Patient came in with 1 week history of abdominal pain.  She states that this was mainly epigastric.  She states that this morning at 7 AM she began with nausea vomiting.  She states this occurred 1 time.  Patient states she continued with bowel function.  Upon evaluation the ER she underwent CT scan that was significant for high-grade small bowel obstruction, mesenteric spiculated mass consistent with carcinoid, and liver masses/mets.  Patient has been following up with Dr. Denman George.  Was supposed to follow-up this Friday.  It is noted that her CA-125 is continued to increase over the last several checks.  Past Medical History:  Diagnosis Date  . Complication of anesthesia    slow to wake up sometimes   . GERD (gastroesophageal reflux disease)   . History of chemotherapy   . History of hiatal hernia   . Hypertension   . Ovarian cancer ([place])   . PONV (postoperative nausea and vomiting)     Past Surgical History:  Procedure Laterality Date  . DEBULKING N/A 12/26/2016   Procedure: RADICAL TUMOR DEBULKING; OMENTECTOMY;  Surgeon: Everitt Amber, MD;  Location: [facility];  Service: Gynecology;  Laterality: N/A;  . ganglion cyst removed    . Hital Hernia    . IR FLUORO GUIDE PORT INSERTION RIGHT  01/16/2017  . IR REMOVAL TUN ACCESS W/ PORT W/O FL MOD SED  06/10/2017  . IR US GUIDE VASC ACCESS RIGHT  01/16/2017  . LAPAROTOMY N/A 12/26/2016   Procedure: EXPLORATORY LAPAROTOMY;  Surgeon: Everitt Amber, MD;  Location: [facility];  Service: Gynecology;  Laterality: N/A;  . left morton's neuroma surgery     . Nissan Fundoplication      Family History  Problem Relation Age of Onset  .  Diabetes Mother   . Diabetes Father   . Hypertension Father   . Stroke Father   . COPD Father   . Heart attack Sister 53  . Dementia Maternal Aunt   . Bone cancer Paternal Uncle        deceased at 41  . Dementia Maternal Grandmother   . Heart attack Paternal Grandmother   . Heart attack Paternal Grandfather   . Healthy Son   . Healthy Son     Social History:  reports that she has never smoked. She has never used smokeless tobacco. She reports that she does not drink alcohol or use drugs.  Allergies:  Allergies  Allergen Reactions  . Dilaudid [Hydromorphone Hcl] Nausea And Vomiting  . Sudafed [Pseudoephedrine Hcl] Other (See Comments)    Nervous/jittery    Medications: I have reviewed the patient's current medications.  Results for orders placed or performed during the hospital encounter of 02/27/19 (from the past 48 hour(s))  CBC with Differential     Status: None   Collection Time: 02/27/19 10:12 AM  Result Value Ref Range   WBC 9.3 4.0 - 10.5 K/uL   RBC 4.71 3.87 - 5.11 MIL/uL   Hemoglobin 14.8 12.0 - 15.0 g/dL   HCT 44.0 36.0 - 46.0 %   MCV 93.4 80.0 - 100.0 fL   MCH 31.4 26.0 - 34.0 pg   MCHC 33.6 30.0 - 36.0 g/dL  RDW 12.2 11.5 - 15.5 %   Platelets 229 150 - 400 K/uL   nRBC 0.0 0.0 - 0.2 %   Neutrophils Relative % 78 %   Neutro Abs 7.4 1.7 - 7.7 K/uL   Lymphocytes Relative 14 %   Lymphs Abs 1.3 0.7 - 4.0 K/uL   Monocytes Relative 6 %   Monocytes Absolute 0.5 0.1 - 1.0 K/uL   Eosinophils Relative 1 %   Eosinophils Absolute 0.1 0.0 - 0.5 K/uL   Basophils Relative 1 %   Basophils Absolute 0.1 0.0 - 0.1 K/uL   Immature Granulocytes 0 %   Abs Immature Granulocytes 0.03 0.00 - 0.07 K/uL    Comment: Performed at [facility] [address], [place], [place] [address]  Comprehensive metabolic panel     Status: Abnormal   Collection Time: 02/27/19 10:12 AM  Result Value Ref Range   Sodium 138 135 - 145 mmol/L   Potassium 4.1 3.5 - 5.1 mmol/L   Chloride  104 98 - 111 mmol/L   CO2 21 (L) 22 - 32 mmol/L   Glucose, Bld 144 (H) 70 - 99 mg/dL   BUN 14 8 - 23 mg/dL   Creatinine, Ser 0.84 0.44 - 1.00 mg/dL   Calcium 9.2 8.9 - 10.3 mg/dL   Total Protein 7.1 6.5 - 8.1 g/dL   Albumin 4.0 3.5 - 5.0 g/dL   AST 22 15 - 41 U/L   ALT 20 0 - 44 U/L   Alkaline Phosphatase 76 38 - 126 U/L   Total Bilirubin 0.8 0.3 - 1.2 mg/dL   GFR calc non Af Amer >60 >60 mL/min   GFR calc Af Amer >60 >60 mL/min   Anion gap 13 5 - 15    Comment: Performed at [facility], [place] [address], [place], [place] [address]  Lipase, blood     Status: None   Collection Time: 02/27/19 10:12 AM  Result Value Ref Range   Lipase 24 11 - 51 U/L    Comment: Performed at [facility] [address], [place], [place] [address]  Urinalysis, Routine w reflex microscopic     Status: Abnormal   Collection Time: 02/27/19 11:48 AM  Result Value Ref Range   Color, Urine STRAW (A) YELLOW   APPearance CLEAR CLEAR   Specific Gravity, Urine 1.018 1.005 - 1.030   pH 7.0 5.0 - 8.0   Glucose, UA NEGATIVE NEGATIVE mg/dL   Hgb urine dipstick SMALL (A) NEGATIVE   Bilirubin Urine NEGATIVE NEGATIVE   Ketones, ur NEGATIVE NEGATIVE mg/dL   Protein, ur 30 (A) NEGATIVE mg/dL   Nitrite NEGATIVE NEGATIVE   Leukocytes,Ua NEGATIVE NEGATIVE   RBC / HPF 0-5 0 - 5 RBC/hpf   WBC, UA 0-5 0 - 5 WBC/hpf   Bacteria, UA NONE SEEN NONE SEEN   Squamous Epithelial / LPF 0-5 0 - 5   Mucus PRESENT     Comment: Performed at [facility], [place] [address], [place], [place] [address]    Ct Abdomen Pelvis W Contrast  Result Date: 02/27/2019 CLINICAL DATA:  Epigastric and suprapubic pain.  Dysuria. EXAM: CT ABDOMEN AND PELVIS WITH CONTRAST TECHNIQUE: Multidetector CT imaging of the abdomen and pelvis was performed using the standard protocol following bolus administration of intravenous contrast. CONTRAST:  160mL OMNIPAQUE IOHEXOL 300 MG/ML  SOLN COMPARISON:  CT abdomen pelvis dated May 26, 2017.  FINDINGS: Lower chest: No acute abnormality. Hepatobiliary: There are multiple new small  subcentimeter hyperdense lesions scattered along the liver capsular surface. Small gallstone. No gallbladder wall thickening or biliary dilatation. Pancreas: Unremarkable. No pancreatic ductal dilatation or surrounding inflammatory changes. Spleen: Normal in size without focal abnormality. Adrenals/Urinary Tract: Adrenal glands are unremarkable. Very mild right hydroureteronephrosis. Mild fullness of the left renal collecting system and ureter. No renal or ureteral calculi. The bladder is unremarkable. Stomach/Bowel: Unchanged small hiatal hernia with surgical clips near the gastroesophageal junction. The stomach is otherwise within normal limits. There are several borderline and mildly dilated loops of small bowel with a transition point in the pelvis, where there is a suggestion of a desmoplastic reaction surrounding a 1.3 cm spiculated nodule in the mesentery (series 6, image 50). The appendix is at the upper limits of normal in size with hyperenhancement near its tip and loss of the normal flat plane between it and the adjacent sigmoid colon (series 6, images 59-61). Infraumbilical ventral abdominal wall diastasis containing a small nondilated portion of transverse colon and a small amount of fluid. The colon is otherwise unremarkable. Vascular/Lymphatic: Aortic atherosclerosis. Enlarged hyperenhancing gastrohepatic and portacaval lymph nodes measuring up to 1.1 cm in short axis. Mildly enlarged bilateral inguinal lymph nodes measuring up to 1.3 cm in short axis. Reproductive: Status post hysterectomy. No adnexal masses. Other: Trace ascites in the pelvis.  No pneumoperitoneum. Musculoskeletal: No acute or significant osseous findings. IMPRESSION: 1. High-grade small bowel obstruction with transition point in the pelvis in an area of suspected desmoplastic reaction surrounding a 1.3 cm spiculated nodule in the mesentery,  suspicious for carcinoid. There is hyperenhancement near the tip of the appendix and loss of the normal fat plane between it and the adjacent sigmoid colon, potentially the site of primary lesion. 2. Multiple new small subcentimeter hyperdense lesions scattered along the liver capsule, concerning for metastases. Enlarged hyperenhancing gastrohepatic and portacaval lymph nodes concerning for nodal metastases. 3. Very mild right hydroureteronephrosis to the level of the desmoplastic reaction in the pelvis, potentially involving the right ureter. 4. Infraumbilical ventral abdominal wall diastasis containing a small nondilated portion of transverse colon. 5. Trace ascites. 6. Cholelithiasis. 7.  Aortic atherosclerosis (ICD10-I70.0). Electronically Signed   By: Titus Dubin M.D.   On: 02/27/2019 12:22    Review of Systems  Constitutional: Negative for chills, fever and malaise/fatigue.  HENT: Negative for ear discharge, hearing loss and sore throat.   Eyes: Negative for blurred vision and discharge.  Respiratory: Negative for cough and shortness of breath.   Cardiovascular: Negative for chest pain, orthopnea and leg swelling.  Gastrointestinal: Positive for abdominal pain, nausea and vomiting. Negative for constipation, diarrhea and heartburn.  Musculoskeletal: Negative for myalgias and neck pain.  Skin: Negative for itching and rash.  Neurological: Negative for dizziness, focal weakness, seizures and loss of consciousness.  Endo/Heme/Allergies: Negative for environmental allergies. Does not bruise/bleed easily.  Psychiatric/Behavioral: Negative for depression and suicidal ideas.  All other systems reviewed and are negative.  Blood pressure (!) 154/81, pulse 75, temperature 97.8 F (36.6 C), temperature source Oral, resp. rate 18, height 5\' 5"  (1.651 m), weight 82.6 kg, SpO2 96 %. Physical Exam  Constitutional: She is oriented to person, place, and time. Vital signs are normal. She appears  well-developed and well-nourished.  Conversant No acute distress  HENT:  Head: Normocephalic and atraumatic.  Eyes: Lids are normal. No scleral icterus.  No lid lag Moist conjunctiva  Neck: No tracheal tenderness present. No thyromegaly present.  No cervical lymphadenopathy  Cardiovascular: Normal rate, regular rhythm and  intact distal pulses.  No murmur heard. Respiratory: Effort normal and breath sounds normal. She has no wheezes. She has no rales.  GI: Soft. Bowel sounds are normal. She exhibits no distension. There is no hepatosplenomegaly. There is no abdominal tenderness. There is no rebound and no guarding. No hernia.  Neurological: She is alert and oriented to person, place, and time.  Normal gait and station  Skin: Skin is warm. No rash noted. No cyanosis. Nails show no clubbing.  Normal skin turgor  Psychiatric: Judgment normal.  Appropriate affect    Assessment/Plan: 59 F with high-grade bowel obstruction likely secondary to mesenteric mass consistent with carcinoid Liver masses History of ovarian cancer  1.  We will hold off on NG tube at this time.  We will begin small bowel obstruction protocol without NG tube. 2.  We will order chromogranin a studies to evaluate for carcinoid. 3.  I discussed with the patient that if this does not improve patient may require surgery to include small bowel obstruction. 4.  We will continue to follow with you. 5.  Would notify Gyn-Onc of pts admission   Ralene Ok 02/27/2019, 1:29 PM

## 2019-02-27 NOTE — ED Notes (Signed)
ED TO INPATIENT HANDOFF REPORT  ED Nurse Name and Phone #: Harriette Bouillon [phone]  S Name/Age/Gender Holly [name] 63 y.o. female Room/Bed: 020C/020C  Code Status   Code Status: Prior  Home/SNF/Other Home Patient oriented to: self, place, time and situation Is this baseline? Yes   Triage Complete: Triage complete  Chief Complaint abd pain, vomiting  Triage Note Patient arrives ambulatory stating she went to PCP yesterday for dysuria and a pressure feeling with urination. Reports 2 episodes of emesis since 6am today. Patient adds that a week ago she spent about 4 hours leaf blowing and has had mid epigastric pain since.    Allergies Allergies  Allergen Reactions  . Dilaudid [Hydromorphone Hcl] Nausea And Vomiting  . Sudafed [Pseudoephedrine Hcl] Other (See Comments)    Nervous/jittery    Level of Care/Admitting Diagnosis ED Disposition    ED Disposition Condition Comment   Admit  Hospital Area: [facility] [[id number]]  Level of Care: Med-Surg [16]  Covid Evaluation: Asymptomatic Screening Protocol (No Symptoms)  Diagnosis: SBO (small bowel obstruction) ([place]) [id number]  Admitting Physician: Merton Border Marshal.Browner  Attending Physician: Laren Everts, ALI Marshal.Browner  Estimated length of stay: past midnight tomorrow  Certification:: I certify this patient will need inpatient services for at least 2 midnights  PT Class (Do Not Modify): Inpatient [101]  PT Acc Code (Do Not Modify): Private [1]       B Medical/Surgery History Past Medical History:  Diagnosis Date  . Complication of anesthesia    slow to wake up sometimes   . GERD (gastroesophageal reflux disease)   . History of chemotherapy   . History of hiatal hernia   . Hypertension   . Ovarian cancer ([place])   . PONV (postoperative nausea and vomiting)    Past Surgical History:  Procedure Laterality Date  . DEBULKING N/A 12/26/2016   Procedure: RADICAL TUMOR DEBULKING; OMENTECTOMY;  Surgeon: Everitt Amber, MD;   Location: [facility];  Service: Gynecology;  Laterality: N/A;  . ganglion cyst removed    . Hital Hernia    . IR FLUORO GUIDE PORT INSERTION RIGHT  01/16/2017  . IR REMOVAL TUN ACCESS W/ PORT W/O FL MOD SED  06/10/2017  . IR US GUIDE VASC ACCESS RIGHT  01/16/2017  . LAPAROTOMY N/A 12/26/2016   Procedure: EXPLORATORY LAPAROTOMY;  Surgeon: Everitt Amber, MD;  Location: [facility];  Service: Gynecology;  Laterality: N/A;  . left morton's neuroma surgery     . [employer] Fundoplication       A IV Location/Drains/Wounds Patient Lines/Drains/Airways Status   Active Line/Drains/Airways    Name:   Placement date:   Placement time:   Site:   Days:   Peripheral IV 02/27/19 Right Hand   02/27/19    1020    Hand   less than 1   Incision (Closed) 12/26/16 Abdomen Other (Comment)   12/26/16    1353     793          Intake/Output Last 24 hours  Intake/Output Summary (Last 24 hours) at 02/27/2019 1417 Last data filed at 02/27/2019 1130 Gross per 24 hour  Intake 1000 ml  Output -  Net 1000 ml    Labs/Imaging Results for orders placed or performed during the hospital encounter of 02/27/19 (from the past 48 hour(s))  CBC with Differential     Status: None   Collection Time: 02/27/19 10:12 AM  Result Value Ref Range   WBC 9.3 4.0 - 10.5 K/uL  RBC 4.71 3.87 - 5.11 MIL/uL   Hemoglobin 14.8 12.0 - 15.0 g/dL   HCT 44.0 36.0 - 46.0 %   MCV 93.4 80.0 - 100.0 fL   MCH 31.4 26.0 - 34.0 pg   MCHC 33.6 30.0 - 36.0 g/dL   RDW 12.2 11.5 - 15.5 %   Platelets 229 150 - 400 K/uL   nRBC 0.0 0.0 - 0.2 %   Neutrophils Relative % 78 %   Neutro Abs 7.4 1.7 - 7.7 K/uL   Lymphocytes Relative 14 %   Lymphs Abs 1.3 0.7 - 4.0 K/uL   Monocytes Relative 6 %   Monocytes Absolute 0.5 0.1 - 1.0 K/uL   Eosinophils Relative 1 %   Eosinophils Absolute 0.1 0.0 - 0.5 K/uL   Basophils Relative 1 %   Basophils Absolute 0.1 0.0 - 0.1 K/uL   Immature Granulocytes 0 %   Abs Immature Granulocytes 0.03 0.00 - 0.07 K/uL    Comment:  Performed at [facility] [address] [place], [place] [address]  Comprehensive metabolic panel     Status: Abnormal   Collection Time: 02/27/19 10:12 AM  Result Value Ref Range   Sodium 138 135 - 145 mmol/L   Potassium 4.1 3.5 - 5.1 mmol/L   Chloride 104 98 - 111 mmol/L   CO2 21 (L) 22 - 32 mmol/L   Glucose, Bld 144 (H) 70 - 99 mg/dL   BUN 14 8 - 23 mg/dL   Creatinine, Ser 0.84 0.44 - 1.00 mg/dL   Calcium 9.2 8.9 - 10.3 mg/dL   Total Protein 7.1 6.5 - 8.1 g/dL   Albumin 4.0 3.5 - 5.0 g/dL   AST 22 15 - 41 U/L   ALT 20 0 - 44 U/L   Alkaline Phosphatase 76 38 - 126 U/L   Total Bilirubin 0.8 0.3 - 1.2 mg/dL   GFR calc non Af Amer >60 >60 mL/min   GFR calc Af Amer >60 >60 mL/min   Anion gap 13 5 - 15    Comment: Performed at [facility], [place] [address] [place], [place] [address]  Lipase, blood     Status: None   Collection Time: 02/27/19 10:12 AM  Result Value Ref Range   Lipase 24 11 - 51 U/L    Comment: Performed at [facility] [address], [place], [place] [address]  Urinalysis, Routine w reflex microscopic     Status: Abnormal   Collection Time: 02/27/19 11:48 AM  Result Value Ref Range   Color, Urine STRAW (A) YELLOW   APPearance CLEAR CLEAR   Specific Gravity, Urine 1.018 1.005 - 1.030   pH 7.0 5.0 - 8.0   Glucose, UA NEGATIVE NEGATIVE mg/dL   Hgb urine dipstick SMALL (A) NEGATIVE   Bilirubin Urine NEGATIVE NEGATIVE   Ketones, ur NEGATIVE NEGATIVE mg/dL   Protein, ur 30 (A) NEGATIVE mg/dL   Nitrite NEGATIVE NEGATIVE   Leukocytes,Ua NEGATIVE NEGATIVE   RBC / HPF 0-5 0 - 5 RBC/hpf   WBC, UA 0-5 0 - 5 WBC/hpf   Bacteria, UA NONE SEEN NONE SEEN   Squamous Epithelial / LPF 0-5 0 - 5   Mucus PRESENT     Comment: Performed at [facility], [place] [address] [place], [place] [address]   Ct Abdomen Pelvis W Contrast  Result Date: 02/27/2019 CLINICAL DATA:  Epigastric and suprapubic pain.  Dysuria. EXAM: CT ABDOMEN AND PELVIS WITH CONTRAST  TECHNIQUE: Multidetector CT imaging  of the abdomen and pelvis was performed using the standard protocol following bolus administration of intravenous contrast. CONTRAST:  149mL OMNIPAQUE IOHEXOL 300 MG/ML  SOLN COMPARISON:  CT abdomen pelvis dated May 26, 2017. FINDINGS: Lower chest: No acute abnormality. Hepatobiliary: There are multiple new small subcentimeter hyperdense lesions scattered along the liver capsular surface. Small gallstone. No gallbladder wall thickening or biliary dilatation. Pancreas: Unremarkable. No pancreatic ductal dilatation or surrounding inflammatory changes. Spleen: Normal in size without focal abnormality. Adrenals/Urinary Tract: Adrenal glands are unremarkable. Very mild right hydroureteronephrosis. Mild fullness of the left renal collecting system and ureter. No renal or ureteral calculi. The bladder is unremarkable. Stomach/Bowel: Unchanged small hiatal hernia with surgical clips near the gastroesophageal junction. The stomach is otherwise within normal limits. There are several borderline and mildly dilated loops of small bowel with a transition point in the pelvis, where there is a suggestion of a desmoplastic reaction surrounding a 1.3 cm spiculated nodule in the mesentery (series 6, image 50). The appendix is at the upper limits of normal in size with hyperenhancement near its tip and loss of the normal flat plane between it and the adjacent sigmoid colon (series 6, images 59-61). Infraumbilical ventral abdominal wall diastasis containing a small nondilated portion of transverse colon and a small amount of fluid. The colon is otherwise unremarkable. Vascular/Lymphatic: Aortic atherosclerosis. Enlarged hyperenhancing gastrohepatic and portacaval lymph nodes measuring up to 1.1 cm in short axis. Mildly enlarged bilateral inguinal lymph nodes measuring up to 1.3 cm in short axis. Reproductive: Status post hysterectomy. No adnexal masses. Other: Trace ascites in the pelvis.  No  pneumoperitoneum. Musculoskeletal: No acute or significant osseous findings. IMPRESSION: 1. High-grade small bowel obstruction with transition point in the pelvis in an area of suspected desmoplastic reaction surrounding a 1.3 cm spiculated nodule in the mesentery, suspicious for carcinoid. There is hyperenhancement near the tip of the appendix and loss of the normal fat plane between it and the adjacent sigmoid colon, potentially the site of primary lesion. 2. Multiple new small subcentimeter hyperdense lesions scattered along the liver capsule, concerning for metastases. Enlarged hyperenhancing gastrohepatic and portacaval lymph nodes concerning for nodal metastases. 3. Very mild right hydroureteronephrosis to the level of the desmoplastic reaction in the pelvis, potentially involving the right ureter. 4. Infraumbilical ventral abdominal wall diastasis containing a small nondilated portion of transverse colon. 5. Trace ascites. 6. Cholelithiasis. 7.  Aortic atherosclerosis (ICD10-I70.0). Electronically Signed   By: Titus Dubin M.D.   On: 02/27/2019 12:22    Pending Labs Unresulted Labs (From admission, onward)    Start     Ordered   02/27/19 1337  Chromogranin A  Once,   STAT     02/27/19 1337   02/27/19 1251  SARS CORONAVIRUS 2 (TAT 6-24 HRS) Nasopharyngeal Nasopharyngeal Swab  (Asymptomatic/Tier 2 Patients Labs)  Once,   STAT    Question Answer Comment  Is this test for diagnosis or screening Screening   Symptomatic for COVID-19 as defined by CDC No   Hospitalized for COVID-19 No   Admitted to ICU for COVID-19 No   Previously tested for COVID-19 No   Resident in a congregate (group) care setting No   Employed in healthcare setting No   Pregnant No      02/27/19 1250   02/27/19 1005  Urine culture  ONCE - STAT,   STAT     02/27/19 1004   Signed and Held  HIV Antibody (routine testing w rflx)  (HIV Antibody (Routine testing  w reflex) panel)  Once,   R     Signed and Held   Signed and  Held  Comprehensive metabolic panel  Tomorrow morning,   R     Signed and Held          Vitals/Pain Today's Vitals   02/27/19 1000 02/27/19 1230 02/27/19 1245 02/27/19 1300  BP: (!) 179/97 (!) 154/81 (!) 171/84 (!) 156/76  Pulse: 92 75 99 82  Resp:  18    Temp:      TempSrc:      SpO2: 94% 96% 94% 96%  Weight:      Height:      PainSc:  3       Isolation Precautions No active isolations  Medications Medications  lidocaine (XYLOCAINE) 2 % jelly 1 application (0 application Topical Hold 02/27/19 1334)  diatrizoate meglumine-sodium (GASTROGRAFIN) 66-10 % solution 90 mL (has no administration in time range)  sodium chloride 0.9 % bolus 1,000 mL (0 mLs Intravenous Stopped 02/27/19 1130)  ondansetron (ZOFRAN) injection 4 mg (4 mg Intravenous Given 02/27/19 1021)  iohexol (OMNIPAQUE) 300 MG/ML solution 100 mL (100 mLs Intravenous Contrast Given 02/27/19 1124)  fentaNYL (SUBLIMAZE) injection 50 mcg (50 mcg Intravenous Given 02/27/19 1316)    Mobility walks Low fall risk   Focused Assessments Pulmonary Assessment Handoff:  Lung sounds:   O2 Device: Room Air        R Recommendations: See Admitting Provider Note  Report given to:   Additional Notes:

## 2019-02-27 NOTE — ED Triage Notes (Signed)
Patient arrives ambulatory stating she went to PCP yesterday for dysuria and a pressure feeling with urination. Reports 2 episodes of emesis since 6am today. Patient adds that a week ago she spent about 4 hours leaf blowing and has had mid epigastric pain since.

## 2019-02-27 NOTE — ED Notes (Signed)
ED Provider at bedside. 

## 2019-02-27 NOTE — ED Notes (Signed)
Attempted to call report.  Nurse unavailable. 

## 2019-02-27 NOTE — ED Provider Notes (Signed)
[facility] EMERGENCY DEPARTMENT Provider Note   CSN: [id number] Arrival date & time: 02/27/19  [id number]     History   Chief Complaint Chief Complaint  Patient presents with   Emesis   Dysuria    HPI Holly [name] is a 63 y.o. female.     The history is provided by the patient.  Abdominal Pain Pain location:  Epigastric and suprapubic Pain quality: aching   Pain radiates to:  Does not radiate Pain severity:  Mild Onset quality:  Gradual Timing:  Intermittent Progression:  Waxing and waning Chronicity:  New Context: previous surgery   Context: not eating   Relieved by:  OTC medications Worsened by:  Nothing Associated symptoms: dysuria, nausea and vomiting   Associated symptoms: no anorexia, no belching, no chest pain, no chills, no constipation, no cough, no fever, no hematuria, no shortness of breath, no sore throat and no vaginal discharge   Risk factors: multiple surgeries   Risk factors: no alcohol abuse     Past Medical History:  Diagnosis Date   Complication of anesthesia    slow to wake up sometimes    GERD (gastroesophageal reflux disease)    History of chemotherapy    History of hiatal hernia    Hypertension    Ovarian cancer (HCC)    PONV (postoperative nausea and vomiting)     Patient Active Problem List   Diagnosis Date Noted   Pancytopenia, acquired ([place]) 05/28/2017   Essential hypertension 05/05/2017   Peripheral neuropathy due to chemotherapy ([place]) 03/04/2017   Hot flashes, menopausal 03/04/2017   Genetic testing 01/31/2017   Goals of care, counseling/discussion 01/21/2017   Physical debility 01/03/2017   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)     Past Surgical History:  Procedure  Laterality Date   DEBULKING N/A 12/26/2016   Procedure: RADICAL TUMOR DEBULKING; OMENTECTOMY;  Surgeon: Everitt Amber, MD;  Location: [facility] ORS;  Service: Gynecology;  Laterality: N/A;   ganglion cyst removed     Hital Hernia     IR FLUORO GUIDE PORT INSERTION RIGHT  01/16/2017   IR REMOVAL TUN ACCESS W/ PORT W/O FL MOD SED  06/10/2017   IR US GUIDE VASC ACCESS RIGHT  01/16/2017   LAPAROTOMY N/A 12/26/2016   Procedure: EXPLORATORY LAPAROTOMY;  Surgeon: Everitt Amber, MD;  Location: [facility];  Service: Gynecology;  Laterality: N/A;   left morton's neuroma surgery      Nissan Fundoplication       OB History   No obstetric history on file.      Home Medications    Prior to Admission medications   Medication Sig Start Date End Date Taking? Authorizing Provider  acetaminophen (TYLENOL) 325 MG tablet Take 975 mg by mouth every 6 (six) hours as needed (pain).   Yes [provider]  augmented betamethasone dipropionate (DIPROLENE-AF) 0.05 % cream Apply 1 application topically daily as needed (dermatitis).  10/20/18  Yes [provider]  triamcinolone cream (KENALOG) 0.1 % Apply 1 application topically daily as needed (dermatitis).  01/22/19  Yes [provider]  venlafaxine XR (EFFEXOR-XR) 37.5 MG 24 hr capsule TAKE 1 CAPSULE BY MOUTH DAILY WITH BREAKFAST Patient taking differently: Take 37.5 mg by mouth daily with breakfast.  08/20/18  Yes Cross, Carollee Massed, NP    Family History Family History  Problem Relation Age of Onset   Diabetes Mother    Diabetes Father    Hypertension Father    Stroke Father    COPD Father    Heart attack Sister 61   Dementia Maternal Aunt    Bone cancer Paternal Uncle        deceased at 81   Dementia Maternal Grandmother    Heart attack Paternal Grandmother    Heart attack Paternal Grandfather    Healthy Son    Healthy Son     Social History Social History   Tobacco Use   Smoking status: Never Smoker   Smokeless  tobacco: Never Used  Substance Use Topics   Alcohol use: No   Drug use: No     Allergies   Dilaudid [hydromorphone hcl] and Sudafed [pseudoephedrine hcl]   Review of Systems Review of Systems  Constitutional: Negative for chills and fever.  HENT: Negative for ear pain and sore throat.   Eyes: Negative for pain and visual disturbance.  Respiratory: Negative for cough and shortness of breath.   Cardiovascular: Negative for chest pain and palpitations.  Gastrointestinal: Positive for abdominal pain, nausea and vomiting. Negative for anorexia and constipation.  Genitourinary: Positive for dysuria. Negative for hematuria and vaginal discharge.  Musculoskeletal: Negative for arthralgias and back pain.  Skin: Negative for color change and rash.  Neurological: Negative for seizures and syncope.  All other systems reviewed and are negative.    Physical Exam Updated Vital Signs  ED Triage Vitals  Enc Vitals Group     BP 02/27/19 0953 (!) 152/92     Pulse Rate 02/27/19 0953 94     Resp 02/27/19 0953 14     Temp 02/27/19 0953 97.8 F (36.6 C)     Temp Source 02/27/19 0953 Oral     SpO2 02/27/19 0953 96 %     Weight 02/27/19 0955 182 lb (82.6 kg)     Height 02/27/19 0955 5\' 5"  (1.651 m)     Head Circumference --      Peak Flow --      Pain Score 02/27/19 0955 3     Pain Loc --      Pain Edu? --      Excl. in [place]? --     Physical Exam Vitals signs and nursing note reviewed.  Constitutional:      General: Holly [name] is not in acute distress.    Appearance: Holly [name] is well-developed.  HENT:     Head: Normocephalic and atraumatic.     Nose: Nose normal.     Mouth/Throat:     Mouth: Mucous membranes are moist.  Eyes:     Extraocular Movements: Extraocular movements intact.     Conjunctiva/sclera: Conjunctivae normal.     Pupils: Pupils are equal, round, and reactive to light.  Neck:     Musculoskeletal: Normal range of motion and neck supple.  Cardiovascular:     Rate and  Rhythm: Normal rate and regular rhythm.     Pulses: Normal pulses.     Heart sounds: Normal heart sounds. No murmur.  Pulmonary:     Effort: Pulmonary effort is normal. No respiratory distress.     Breath sounds: Normal breath sounds.  Abdominal:     General: Abdomen is flat. There is no distension.     Palpations: Abdomen is soft.     Tenderness: There is abdominal tenderness (epigastric/suprapubic). There  is no right CVA tenderness, left CVA tenderness, guarding or rebound.  Musculoskeletal: Normal range of motion.        General: No swelling.  Skin:    General: Skin is warm and dry.     Capillary Refill: Capillary refill takes less than 2 seconds.  Neurological:     General: No focal deficit present.     Mental Status: Holly [name] is alert.  Psychiatric:        Mood and Affect: Mood normal.      ED Treatments / Results  Labs (all labs ordered are listed, but only abnormal results are displayed) Labs Reviewed  COMPREHENSIVE METABOLIC PANEL - Abnormal; Notable for the following components:      Result Value   CO2 21 (*)    Glucose, Bld 144 (*)    All other components within normal limits  URINALYSIS, ROUTINE W REFLEX MICROSCOPIC - Abnormal; Notable for the following components:   Color, Urine STRAW (*)    Hgb urine dipstick SMALL (*)    Protein, ur 30 (*)    All other components within normal limits  URINE CULTURE  SARS CORONAVIRUS 2 (TAT 6-24 HRS)  CBC WITH DIFFERENTIAL/PLATELET  LIPASE, BLOOD    EKG None  Radiology Ct Abdomen Pelvis W Contrast  Result Date: 02/27/2019 CLINICAL DATA:  Epigastric and suprapubic pain.  Dysuria. EXAM: CT ABDOMEN AND PELVIS WITH CONTRAST TECHNIQUE: Multidetector CT imaging of the abdomen and pelvis was performed using the standard protocol following bolus administration of intravenous contrast. CONTRAST:  163mL OMNIPAQUE IOHEXOL 300 MG/ML  SOLN COMPARISON:  CT abdomen pelvis dated May 26, 2017. FINDINGS: Lower chest: No acute abnormality.  Hepatobiliary: There are multiple new small subcentimeter hyperdense lesions scattered along the liver capsular surface. Small gallstone. No gallbladder wall thickening or biliary dilatation. Pancreas: Unremarkable. No pancreatic ductal dilatation or surrounding inflammatory changes. Spleen: Normal in size without focal abnormality. Adrenals/Urinary Tract: Adrenal glands are unremarkable. Very mild right hydroureteronephrosis. Mild fullness of the left renal collecting system and ureter. No renal or ureteral calculi. The bladder is unremarkable. Stomach/Bowel: Unchanged small hiatal hernia with surgical clips near the gastroesophageal junction. The stomach is otherwise within normal limits. There are several borderline and mildly dilated loops of small bowel with a transition point in the pelvis, where there is a suggestion of a desmoplastic reaction surrounding a 1.3 cm spiculated nodule in the mesentery (series 6, image 50). The appendix is at the upper limits of normal in size with hyperenhancement near its tip and loss of the normal flat plane between it and the adjacent sigmoid colon (series 6, images 59-61). Infraumbilical ventral abdominal wall diastasis containing a small nondilated portion of transverse colon and a small amount of fluid. The colon is otherwise unremarkable. Vascular/Lymphatic: Aortic atherosclerosis. Enlarged hyperenhancing gastrohepatic and portacaval lymph nodes measuring up to 1.1 cm in short axis. Mildly enlarged bilateral inguinal lymph nodes measuring up to 1.3 cm in short axis. Reproductive: Status post hysterectomy. No adnexal masses. Other: Trace ascites in the pelvis.  No pneumoperitoneum. Musculoskeletal: No acute or significant osseous findings. IMPRESSION: 1. High-grade small bowel obstruction with transition point in the pelvis in an area of suspected desmoplastic reaction surrounding a 1.3 cm spiculated nodule in the mesentery, suspicious for carcinoid. There is  hyperenhancement near the tip of the appendix and loss of the normal fat plane between it and the adjacent sigmoid colon, potentially the site of primary lesion. 2. Multiple new small subcentimeter hyperdense lesions scattered along the liver  capsule, concerning for metastases. Enlarged hyperenhancing gastrohepatic and portacaval lymph nodes concerning for nodal metastases. 3. Very mild right hydroureteronephrosis to the level of the desmoplastic reaction in the pelvis, potentially involving the right ureter. 4. Infraumbilical ventral abdominal wall diastasis containing a small nondilated portion of transverse colon. 5. Trace ascites. 6. Cholelithiasis. 7.  Aortic atherosclerosis (ICD10-I70.0). Electronically Signed   By: Titus Dubin M.D.   On: 02/27/2019 12:22    Procedures Procedures (including critical care time)  Medications Ordered in ED Medications  lidocaine (XYLOCAINE) 2 % jelly 1 application (has no administration in time range)  fentaNYL (SUBLIMAZE) injection 50 mcg (has no administration in time range)  sodium chloride 0.9 % bolus 1,000 mL (1,000 mLs Intravenous New Bag/Given 02/27/19 1021)  ondansetron (ZOFRAN) injection 4 mg (4 mg Intravenous Given 02/27/19 1021)  iohexol (OMNIPAQUE) 300 MG/ML solution 100 mL (100 mLs Intravenous Contrast Given 02/27/19 1124)     Initial Impression / Assessment and Plan / ED Course  I have reviewed the triage vital signs and the nursing notes.  Pertinent labs & imaging results that were available during my care of the patient were reviewed by me and considered in my medical decision making (see chart for details).     CHARRISSE CH is a 63 year old female with history of hiatal hernia status post repair who presents to the ED with abdominal pain, nausea, vomiting.  Holly [name] has had some dysuria.  Symptoms on and off for the last several weeks but has had 2 episodes of emesis and epigastric abdominal pain worse this morning.  Had a urinalysis done at  primary care doctor's office yesterday that may have shown infection but did not start treatment.  Holly [name] continues to have some urinary discomfort.  Holly [name] has some pain in the suprapubic region on exam.  Holly [name] has some epigastric tenderness on exam but no focal right upper quadrant tenderness.  Denies any alcohol use.  Concern for pancreatitis versus hepatobiliary process versus UTI versus gastritis.  Will get lab work including CT scan abdomen and pelvis.  Will give IV fluids, IV Zofran and reevaluate.  CT scan showed high-grade small bowel obstruction likely from mass in the mesentery.  Has a history of cervical cancer status post hysterectomy and chemotherapy.  Appears to maybe have metastasis in the liver.  Talked with Dr. Rosendo Gros with general surgery who recommends NG tube and he will come evaluate the patient.  To be admitted to medicine service.  Lab work overall unremarkable.  No signs urinary tract infection.  Holly [name] does follow outpatient with gynecology oncology as they may need to be involved as well.  Hemodynamically stable throughout my care.  Given IV fentanyl for pain.  This chart was dictated using voice recognition software.  Despite best efforts to proofread,  errors can occur which can change the documentation meaning.    Final Clinical Impressions(s) / ED Diagnoses   Final diagnoses:  SBO (small bowel obstruction) [facility])    ED Discharge Orders    None       Lennice Sites, DO 02/27/19 1315

## 2019-02-27 NOTE — Progress Notes (Signed)
Report received from ED RN, Lattie Haw.

## 2019-02-27 NOTE — Progress Notes (Signed)
Pt requested pain medicine. Administered prn morphine as ordered. Pt immediately felt nauseated. Zofran given with minimal relief. Pt vomited x1 and stated she felt better.

## 2019-02-28 ENCOUNTER — Inpatient Hospital Stay (HOSPITAL_COMMUNITY): Payer: BC Managed Care – PPO

## 2019-02-28 DIAGNOSIS — K56609 Unspecified intestinal obstruction, unspecified as to partial versus complete obstruction: Secondary | ICD-10-CM

## 2019-02-28 DIAGNOSIS — Z0189 Encounter for other specified special examinations: Secondary | ICD-10-CM

## 2019-02-28 LAB — COMPREHENSIVE METABOLIC PANEL
ALT: 16 U/L (ref 0–44)
AST: 19 U/L (ref 15–41)
Albumin: 3.4 g/dL — ABNORMAL LOW (ref 3.5–5.0)
Alkaline Phosphatase: 66 U/L (ref 38–126)
Anion gap: 8 (ref 5–15)
BUN: 10 mg/dL (ref 8–23)
CO2: 27 mmol/L (ref 22–32)
Calcium: 8.9 mg/dL (ref 8.9–10.3)
Chloride: 105 mmol/L (ref 98–111)
Creatinine, Ser: 1.11 mg/dL — ABNORMAL HIGH (ref 0.44–1.00)
GFR calc Af Amer: >60 mL/min (ref >60)
GFR calc non Af Amer: 52 mL/min — ABNORMAL LOW (ref >60)
Glucose, Bld: 148 mg/dL — ABNORMAL HIGH (ref 70–99)
Potassium: 3.9 mmol/L (ref 3.5–5.1)
Sodium: 140 mmol/L (ref 135–145)
Total Bilirubin: 0.7 mg/dL (ref 0.3–1.2)
Total Protein: 6.5 g/dL (ref 6.5–8.1)

## 2019-02-28 LAB — URINE CULTURE: Culture: 20000 — AB

## 2019-02-28 MED ORDER — ONDANSETRON HCL 4 MG PO TABS: 4.0000 mg | ORAL_TABLET | Freq: Four times a day (QID) | ORAL | 0 refills | Status: DC | PRN

## 2019-02-28 NOTE — Discharge Summary (Addendum)
Physician Discharge Summary  Holly [name] [id number] DOB: October 04, 1955 DOA: 02/27/2019  PCP: Gaynelle Arabian, MD  Admit date: 02/27/2019 Discharge date: 02/28/2019  Time spent: 35* minutes  Recommendations for Outpatient Follow-up:  1. Follow up Dr Denman George in one week   Discharge Diagnoses:  Active Problems:   SBO (small bowel obstruction) ([place])   Discharge Condition: Stable  Diet recommendation: Full liquid diet  Filed Weights   02/27/19 0955  Weight: 82.6 kg    History of present illness:  As per Dr Estelle Grumbles  is a 63 y.o. female, with past medical history significant for ovarian cancer status post exploratory laparotomy, total abdominal hysterectomy, BSO, omentectomy and radical debulking in 2018 presenting today with 1 week history of abdominal pain with nausea and vomiting that started at 7 AM this morning.  No history of constipation or diarrhea Work-up in the emergency room revealed high grade small bowel obstruction with mesenteric spiculated mass with liver mets .  Her CBC and BMP were within normal. General surgery, Dr. Rosendo Gros consulted and an NG tube was placed. Patient usually follows with Dr. Denman George.  Hospital Course:  Small bowel obstruction-patient has history of ovarian cancer status post debulking procedure.  Presented with abdominal pain nausea.  CT scan showed high-grade small bowel obstruction with transition point in the pelvis in the area of suspected desmoplastic reaction surrounding 1.3 cm spiculated nodule in the mesentery.  Suspicious for carcinoid.  General surgery was consulted, x-ray this morning shows the CT contrast throughout her colon and she has passed flatus.  She also had liquid BM this morning.  General surgery recommends patient to be discharged home on clear liquid diet.  She can follow-up with Dr. Denman George in 1 week.  Procedures:  None  Consultations:  General surgery  Discharge Exam: Vitals:   02/27/19 2049 02/28/19 0533   BP: 122/68 104/66  Pulse: 68 65  Resp: 16 18  Temp: 99.1 F (37.3 C) 98.8 F (37.1 C)  SpO2: 95% 99%    General: Appears in no acute distress Cardiovascular: S1-S2, regular Respiratory: Clear to auscultation bilaterally Abdomen-soft, nontender to palpation.  Discharge Instructions   Discharge Instructions    Diet clear liquid   Complete by: As directed     Complete by: As directed    Increase activity slowly   Complete by: As directed      Allergies as of 02/28/2019      Reactions   Dilaudid [hydromorphone Hcl] Nausea And Vomiting   Sudafed [pseudoephedrine Hcl] Other (See Comments)   Nervous/jittery      Medication List    TAKE these medications   acetaminophen 325 MG tablet Commonly known as: TYLENOL Take 975 mg by mouth every 6 (six) hours as needed (pain).   augmented betamethasone dipropionate 0.05 % cream Commonly known as: DIPROLENE-AF Apply 1 application topically daily as needed (dermatitis).   ondansetron 4 MG tablet Commonly known as: ZOFRAN Take 1 tablet (4 mg total) by mouth every 6 (six) hours as needed for nausea.   triamcinolone cream 0.1 % Commonly known as: KENALOG Apply 1 application topically daily as needed (dermatitis).   venlafaxine XR 37.5 MG 24 hr capsule Commonly known as: EFFEXOR-XR TAKE 1 CAPSULE BY MOUTH DAILY WITH BREAKFAST What changed: See the new instructions.      Allergies  Allergen Reactions  . Dilaudid [Hydromorphone Hcl] Nausea And Vomiting  . Sudafed [Pseudoephedrine Hcl] Other (See Comments)    Nervous/jittery      The results  of significant diagnostics from this hospitalization (including imaging, microbiology, ancillary and laboratory) are listed below for reference.    Significant Diagnostic Studies: Ct Abdomen Pelvis W Contrast  Result Date: 02/27/2019 CLINICAL DATA:  Epigastric and suprapubic pain.  Dysuria. EXAM: CT ABDOMEN AND PELVIS WITH CONTRAST TECHNIQUE: Multidetector CT imaging of the abdomen  and pelvis was performed using the standard protocol following bolus administration of intravenous contrast. CONTRAST:  153mL OMNIPAQUE IOHEXOL 300 MG/ML  SOLN COMPARISON:  CT abdomen pelvis dated May 26, 2017. FINDINGS: Lower chest: No acute abnormality. Hepatobiliary: There are multiple new small subcentimeter hyperdense lesions scattered along the liver capsular surface. Small gallstone. No gallbladder wall thickening or biliary dilatation. Pancreas: Unremarkable. No pancreatic ductal dilatation or surrounding inflammatory changes. Spleen: Normal in size without focal abnormality. Adrenals/Urinary Tract: Adrenal glands are unremarkable. Very mild right hydroureteronephrosis. Mild fullness of the left renal collecting system and ureter. No renal or ureteral calculi. The bladder is unremarkable. Stomach/Bowel: Unchanged small hiatal hernia with surgical clips near the gastroesophageal junction. The stomach is otherwise within normal limits. There are several borderline and mildly dilated loops of small bowel with a transition point in the pelvis, where there is a suggestion of a desmoplastic reaction surrounding a 1.3 cm spiculated nodule in the mesentery (series 6, image 50). The appendix is at the upper limits of normal in size with hyperenhancement near its tip and loss of the normal flat plane between it and the adjacent sigmoid colon (series 6, images 59-61). Infraumbilical ventral abdominal wall diastasis containing a small nondilated portion of transverse colon and a small amount of fluid. The colon is otherwise unremarkable. Vascular/Lymphatic: Aortic atherosclerosis. Enlarged hyperenhancing gastrohepatic and portacaval lymph nodes measuring up to 1.1 cm in short axis. Mildly enlarged bilateral inguinal lymph nodes measuring up to 1.3 cm in short axis. Reproductive: Status post hysterectomy. No adnexal masses. Other: Trace ascites in the pelvis.  No pneumoperitoneum. Musculoskeletal: No acute or  significant osseous findings. IMPRESSION: 1. High-grade small bowel obstruction with transition point in the pelvis in an area of suspected desmoplastic reaction surrounding a 1.3 cm spiculated nodule in the mesentery, suspicious for carcinoid. There is hyperenhancement near the tip of the appendix and loss of the normal fat plane between it and the adjacent sigmoid colon, potentially the site of primary lesion. 2. Multiple new small subcentimeter hyperdense lesions scattered along the liver capsule, concerning for metastases. Enlarged hyperenhancing gastrohepatic and portacaval lymph nodes concerning for nodal metastases. 3. Very mild right hydroureteronephrosis to the level of the desmoplastic reaction in the pelvis, potentially involving the right ureter. 4. Infraumbilical ventral abdominal wall diastasis containing a small nondilated portion of transverse colon. 5. Trace ascites. 6. Cholelithiasis. 7.  Aortic atherosclerosis (ICD10-I70.0). Electronically Signed   By: Titus Dubin M.D.   On: 02/27/2019 12:22   Dg Abd Portable 1v-small Bowel Obstruction Protocol-initial, 8 Hr Delay  Result Date: 02/28/2019 CLINICAL DATA:  Small-bowel obstruction.  A are delay film. EXAM: PORTABLE ABDOMEN - 1 VIEW COMPARISON:  February 27, 2019 CT. FINDINGS: There are persistently dilated loops of small bowel scattered throughout the abdomen. Oral contrast remains in the small bowel and stomach. Oral contrast is seen extending into the ascending colon and transverse colon. Oral contrast is not definitively identified in the descending colon or rectum. Contrast is seen within the urinary bladder. IMPRESSION: 1. Persistent small bowel obstruction. 2. Oral contrast remains within the stomach and small bowel. 3. Small amount of oral contrast is seen within the ascending and  transverse colon. 4. No pneumatosis or free air. Electronically Signed   By: Constance Holster M.D.   On: 02/28/2019 01:48    Microbiology: Recent  Results (from the past 240 hour(s))  Urine culture     Status: None (Preliminary result)   Collection Time: 02/27/19 11:41 AM   Specimen: Urine, Random  Result Value Ref Range Status   Specimen Description URINE, RANDOM  Final   Special Requests NONE  Final   Culture   Final    CULTURE REINCUBATED FOR BETTER GROWTH Performed at [facility], [place] [address], [place], [place] [address]    Report Status PENDING  Incomplete  SARS CORONAVIRUS 2 (TAT 6-24 HRS) Nasopharyngeal Nasopharyngeal Swab     Status: None   Collection Time: 02/27/19  1:08 PM   Specimen: Nasopharyngeal Swab  Result Value Ref Range Status   SARS Coronavirus 2 NEGATIVE NEGATIVE Final    Comment: (NOTE) SARS-CoV-2 target nucleic acids are NOT DETECTED. The SARS-CoV-2 RNA is generally detectable in upper and lower respiratory specimens during the acute phase of infection. Negative results do not preclude SARS-CoV-2 infection, do not rule out co-infections with other pathogens, and should not be used as the sole basis for treatment or other patient management decisions. Negative results must be combined with clinical observations, patient history, and epidemiological information. The expected result is Negative. Fact Sheet for Patients: SugarRoll.be Fact Sheet for Healthcare Providers: https://www.woods-mathews.com/ This test is not yet approved or cleared by the [place] FDA and  has been authorized for detection and/or diagnosis of SARS-CoV-2 by FDA under an Emergency Use Authorization (EUA). This EUA will remain  in effect (meaning this test can be used) for the duration of the COVID-19 declaration under Section 56 4(b)(1) of the Act, 21 U.S.C. section 360bbb-3(b)(1), unless the authorization is terminated or revoked sooner. Performed at [facility], [place] [address], [place], [place] [address]      Labs: Basic Metabolic Panel: Recent Labs  Lab  02/27/19 1012 02/28/19 0242  NA 138 140  K 4.1 3.9  CL 104 105  CO2 21* 27  GLUCOSE 144* 148*  BUN 14 10  CREATININE 0.84 1.11*  CALCIUM 9.2 8.9   Liver Function Tests: Recent Labs  Lab 02/27/19 1012 02/28/19 0242  AST 22 19  ALT 20 16  ALKPHOS 76 66  BILITOT 0.8 0.7  PROT 7.1 6.5  ALBUMIN 4.0 3.4*   Recent Labs  Lab 02/27/19 1012  LIPASE 24   No results for input(s): AMMONIA in the last 168 hours. CBC: Recent Labs  Lab 02/27/19 1012  WBC 9.3  NEUTROABS 7.4  HGB 14.8  HCT 44.0  MCV 93.4  PLT 229       Signed:  Oswald Hillock MD.  [facility] 02/28/2019, 11:31 AM

## 2019-02-28 NOTE — Progress Notes (Signed)
Patient ID: Holly [name], female   DOB: July 26, 1955, 63 y.o.   MRN: [id number] [facility] Progress Note:   * No surgery found *  Subjective: Mental status is clear;  Obstructive complaints that she was having have resolved Objective: Vital signs in last 24 hours: Temp:  [97.8 F (36.6 C)-99.1 F (37.3 C)] 98.8 F (37.1 C) (11/08 0533) Pulse Rate:  [65-99] 65 (11/08 0533) Resp:  [14-18] 18 (11/08 0533) BP: (104-179)/(66-97) 104/66 (11/08 0533) SpO2:  [94 %-99 %] 99 % (11/08 0533) Weight:  [82.6 kg] 82.6 kg (11/07 0955)  Intake/Output from previous day: 11/07 0701 - 11/08 0700 In: 2061.5 [I.V.:1061.5; IV Piggyback:1000] Out: -  Intake/Output this shift: No intake/output data recorded.  Physical Exam: Work of breathing is normal.  She is not in pain and is passing flatus.    Lab Results:  Results for orders placed or performed during the hospital encounter of 02/27/19 (from the past 48 hour(s))  CBC with Differential     Status: None   Collection Time: 02/27/19 10:12 AM  Result Value Ref Range   WBC 9.3 4.0 - 10.5 K/uL   RBC 4.71 3.87 - 5.11 MIL/uL   Hemoglobin 14.8 12.0 - 15.0 g/dL   HCT 44.0 36.0 - 46.0 %   MCV 93.4 80.0 - 100.0 fL   MCH 31.4 26.0 - 34.0 pg   MCHC 33.6 30.0 - 36.0 g/dL   RDW 12.2 11.5 - 15.5 %   Platelets 229 150 - 400 K/uL   nRBC 0.0 0.0 - 0.2 %   Neutrophils Relative % 78 %   Neutro Abs 7.4 1.7 - 7.7 K/uL   Lymphocytes Relative 14 %   Lymphs Abs 1.3 0.7 - 4.0 K/uL   Monocytes Relative 6 %   Monocytes Absolute 0.5 0.1 - 1.0 K/uL   Eosinophils Relative 1 %   Eosinophils Absolute 0.1 0.0 - 0.5 K/uL   Basophils Relative 1 %   Basophils Absolute 0.1 0.0 - 0.1 K/uL   Immature Granulocytes 0 %   Abs Immature Granulocytes 0.03 0.00 - 0.07 K/uL    Comment: Performed at [facility], [address] [place], [place] [address]  Comprehensive metabolic panel     Status: Abnormal   Collection Time: 02/27/19 10:12 AM  Result Value Ref Range    Sodium 138 135 - 145 mmol/L   Potassium 4.1 3.5 - 5.1 mmol/L   Chloride 104 98 - 111 mmol/L   CO2 21 (L) 22 - 32 mmol/L   Glucose, Bld 144 (H) 70 - 99 mg/dL   BUN 14 8 - 23 mg/dL   Creatinine, Ser 0.84 0.44 - 1.00 mg/dL   Calcium 9.2 8.9 - 10.3 mg/dL   Total Protein 7.1 6.5 - 8.1 g/dL   Albumin 4.0 3.5 - 5.0 g/dL   AST 22 15 - 41 U/L   ALT 20 0 - 44 U/L   Alkaline Phosphatase 76 38 - 126 U/L   Total Bilirubin 0.8 0.3 - 1.2 mg/dL   GFR calc non Af Amer >60 >60 mL/min   GFR calc Af Amer >60 >60 mL/min   Anion gap 13 5 - 15    Comment: Performed at [facility], [place] [address] [place], [place] [address]  Lipase, blood     Status: None   Collection Time: 02/27/19 10:12 AM  Result Value Ref Range   Lipase 24 11 - 51 U/L    Comment: Performed at [facility], [place] [address], [place], [place] [address]  Urinalysis, Routine w reflex microscopic     Status: Abnormal   Collection Time: 02/27/19 11:48 AM  Result Value Ref Range   Color, Urine STRAW (A) YELLOW   APPearance CLEAR CLEAR   Specific Gravity, Urine 1.018 1.005 - 1.030   pH 7.0 5.0 - 8.0   Glucose, UA NEGATIVE NEGATIVE mg/dL   Hgb urine dipstick SMALL (A) NEGATIVE   Bilirubin Urine NEGATIVE NEGATIVE   Ketones, ur NEGATIVE NEGATIVE mg/dL   Protein, ur 30 (A) NEGATIVE mg/dL   Nitrite NEGATIVE NEGATIVE   Leukocytes,Ua NEGATIVE NEGATIVE   RBC / HPF 0-5 0 - 5 RBC/hpf   WBC, UA 0-5 0 - 5 WBC/hpf   Bacteria, UA NONE SEEN NONE SEEN   Squamous Epithelial / LPF 0-5 0 - 5   Mucus PRESENT     Comment: Performed at [facility], [place] [address] [place], [place] [address]  SARS CORONAVIRUS 2 (TAT 6-24 HRS) Nasopharyngeal Nasopharyngeal Swab     Status: None   Collection Time: 02/27/19  1:08 PM   Specimen: Nasopharyngeal Swab  Result Value Ref Range   SARS Coronavirus 2 NEGATIVE NEGATIVE    Comment: (NOTE) SARS-CoV-2 target nucleic acids are NOT DETECTED. The SARS-CoV-2 RNA is generally detectable in upper and  lower respiratory specimens during the acute phase of infection. Negative results do not preclude SARS-CoV-2 infection, do not rule out co-infections with other pathogens, and should not be used as the sole basis for treatment or other patient management decisions. Negative results must be combined with clinical observations, patient history, and epidemiological information. The expected result is Negative. Fact Sheet for Patients: SugarRoll.be Fact Sheet for Healthcare Providers: https://www.woods-mathews.com/ This test is not yet approved or cleared by the [place] FDA and  has been authorized for detection and/or diagnosis of SARS-CoV-2 by FDA under an Emergency Use Authorization (EUA). This EUA will remain  in effect (meaning this test can be used) for the duration of the COVID-19 declaration under Section 56 4(b)(1) of the Act, 21 U.S.C. section 360bbb-3(b)(1), unless the authorization is terminated or revoked sooner. Performed at [place] [facility], [place] [address] [place], [place] [address]   HIV Antibody (routine testing w rflx)     Status: None   Collection Time: 02/27/19  3:23 PM  Result Value Ref Range   HIV Screen 4th Generation wRfx NON REACTIVE NON REACTIVE    Comment: Performed at [facility], [place] [address], [place], [place] [address]  Comprehensive metabolic panel     Status: Abnormal   Collection Time: 02/28/19  2:42 AM  Result Value Ref Range   Sodium 140 135 - 145 mmol/L   Potassium 3.9 3.5 - 5.1 mmol/L   Chloride 105 98 - 111 mmol/L   CO2 27 22 - 32 mmol/L   Glucose, Bld 148 (H) 70 - 99 mg/dL   BUN 10 8 - 23 mg/dL   Creatinine, Ser 1.11 (H) 0.44 - 1.00 mg/dL   Calcium 8.9 8.9 - 10.3 mg/dL   Total Protein 6.5 6.5 - 8.1 g/dL   Albumin 3.4 (L) 3.5 - 5.0 g/dL   AST 19 15 - 41 U/L   ALT 16 0 - 44 U/L   Alkaline Phosphatase 66 38 - 126 U/L   Total Bilirubin 0.7 0.3 - 1.2 mg/dL   GFR calc non Af Amer 52 (L)  >60 mL/min   GFR calc Af Amer >60 >60  mL/min   Anion gap 8 5 - 15    Comment: Performed at [facility] [name] [place], [place] [address]    Radiology/Results: Ct Abdomen Pelvis W Contrast  Result Date: 02/27/2019 CLINICAL DATA:  Epigastric and suprapubic pain.  Dysuria. EXAM: CT ABDOMEN AND PELVIS WITH CONTRAST TECHNIQUE: Multidetector CT imaging of the abdomen and pelvis was performed using the standard protocol following bolus administration of intravenous contrast. CONTRAST:  118mL OMNIPAQUE IOHEXOL 300 MG/ML  SOLN COMPARISON:  CT abdomen pelvis dated May 26, 2017. FINDINGS: Lower chest: No acute abnormality. Hepatobiliary: There are multiple new small subcentimeter hyperdense lesions scattered along the liver capsular surface. Small gallstone. No gallbladder wall thickening or biliary dilatation. Pancreas: Unremarkable. No pancreatic ductal dilatation or surrounding inflammatory changes. Spleen: Normal in size without focal abnormality. Adrenals/Urinary Tract: Adrenal glands are unremarkable. Very mild right hydroureteronephrosis. Mild fullness of the left renal collecting system and ureter. No renal or ureteral calculi. The bladder is unremarkable. Stomach/Bowel: Unchanged small hiatal hernia with surgical clips near the gastroesophageal junction. The stomach is otherwise within normal limits. There are several borderline and mildly dilated loops of small bowel with a transition point in the pelvis, where there is a suggestion of a desmoplastic reaction surrounding a 1.3 cm spiculated nodule in the mesentery (series 6, image 50). The appendix is at the upper limits of normal in size with hyperenhancement near its tip and loss of the normal flat plane between it and the adjacent sigmoid colon (series 6, images 59-61). Infraumbilical ventral abdominal wall diastasis containing a small nondilated portion of transverse colon and a small amount of fluid. The colon is otherwise  unremarkable. Vascular/Lymphatic: Aortic atherosclerosis. Enlarged hyperenhancing gastrohepatic and portacaval lymph nodes measuring up to 1.1 cm in short axis. Mildly enlarged bilateral inguinal lymph nodes measuring up to 1.3 cm in short axis. Reproductive: Status post hysterectomy. No adnexal masses. Other: Trace ascites in the pelvis.  No pneumoperitoneum. Musculoskeletal: No acute or significant osseous findings. IMPRESSION: 1. High-grade small bowel obstruction with transition point in the pelvis in an area of suspected desmoplastic reaction surrounding a 1.3 cm spiculated nodule in the mesentery, suspicious for carcinoid. There is hyperenhancement near the tip of the appendix and loss of the normal fat plane between it and the adjacent sigmoid colon, potentially the site of primary lesion. 2. Multiple new small subcentimeter hyperdense lesions scattered along the liver capsule, concerning for metastases. Enlarged hyperenhancing gastrohepatic and portacaval lymph nodes concerning for nodal metastases. 3. Very mild right hydroureteronephrosis to the level of the desmoplastic reaction in the pelvis, potentially involving the right ureter. 4. Infraumbilical ventral abdominal wall diastasis containing a small nondilated portion of transverse colon. 5. Trace ascites. 6. Cholelithiasis. 7.  Aortic atherosclerosis (ICD10-I70.0). Electronically Signed   By: Titus Dubin M.D.   On: 02/27/2019 12:22   Dg Abd Portable 1v-small Bowel Obstruction Protocol-initial, 8 Hr Delay  Result Date: 02/28/2019 CLINICAL DATA:  Small-bowel obstruction.  A are delay film. EXAM: PORTABLE ABDOMEN - 1 VIEW COMPARISON:  February 27, 2019 CT. FINDINGS: There are persistently dilated loops of small bowel scattered throughout the abdomen. Oral contrast remains in the small bowel and stomach. Oral contrast is seen extending into the ascending colon and transverse colon. Oral contrast is not definitively identified in the descending colon  or rectum. Contrast is seen within the urinary bladder. IMPRESSION: 1. Persistent small bowel obstruction. 2. Oral contrast remains within the stomach and small bowel. 3. Small amount of oral  contrast is seen within the ascending and transverse colon. 4. No pneumatosis or free air. Electronically Signed   By: Constance Holster M.D.   On: 02/28/2019 01:48    Anti-infectives: Anti-infectives (From admission, onward)   None      Assessment/Plan: Problem List: Patient Active Problem List   Diagnosis Date Noted  . SBO (small bowel obstruction) ([place]) 02/27/2019  . Pancytopenia, acquired ([place]) 05/28/2017  . Essential hypertension 05/05/2017  . Peripheral neuropathy due to chemotherapy ([place]) 03/04/2017  . Hot flashes, menopausal 03/04/2017  . Genetic testing 01/31/2017  . Goals of care, counseling/discussion 01/21/2017  . Physical debility 01/03/2017  . Adenocarcinoma of right fallopian tube ([place]) 12/31/2016  . Ovarian cancer ([place])   . Pelvic mass in female   . Left ovarian epithelial cancer ([place])   . Secondary malignant neoplasm of parietal peritoneum ([place])   . Secondary malignant neoplasm of omentum ([place])     I spoke with her and her husband in [facility].  Her xray today shows the CT constrast throughout her colon and she has passed flatus and some liquid per rectum.  I clinically she is better than the Xray report and I bet that she can tolerate liquids.   She has an area in her pelvis suspicious for recurrent cancer on CT with probably a narrowed area of her small bowel that can obstruct with solids.  There may be mets on the liver capsule and some deep nodes as well.   I suggested that she could start liquids today and be discharged to follow up with Dr. Denman George at [facility] [place] [facility] this week.  She is scheduled for labs this Friday but hopefully that can be moved up.  For hospitalization [facility] at [facility] and not at [facility].   * No surgery found *    LOS: 1 day   Matt  B. Hassell Done, MD, [facility] [phone] beeper [phone]  02/28/2019 8:46 AM

## 2019-03-01 ENCOUNTER — Telehealth: Payer: Self-pay | Admitting: *Deleted

## 2019-03-01 LAB — CHROMOGRANIN A: Chromogranin A (ng/mL): 37.5 ng/mL (ref 0.0–101.8)

## 2019-03-01 NOTE — Telephone Encounter (Signed)
Told Holly [name] that Dr. Denman George is aware of her hospitalization over  The weekend and will review the films.  She needs to keep appointments as scheduled with Dr. Denman George.  There ar no earlier appointments at this time.

## 2019-03-01 NOTE — Telephone Encounter (Signed)
Patient called and stated that "I was in the hospital over the weekend. We thought it was my gallbladder, but it was SBO. I had a CT scan and they found some things. Dr. Rosendo Gros wanted me to follow up with Dr. Denman George. I have labs on Friday and see her next Tuesday. Is it ok to wait, or do I need to come in sooner? Dr. Rosendo Gros said he would see her today in the OR and talk with her."

## 2019-03-02 ENCOUNTER — Telehealth: Payer: Self-pay

## 2019-03-02 NOTE — Telephone Encounter (Signed)
Holly [name] called wanting to let us know that she has a UTI and ws prescribed antibiotics.  She also wanted to know if she could advance her diet from full liquid.  Per [name], NP pt can advance to soft, bland diet.  This was relayed to Holly Vanalstine and she verbalized understanding.  Her appt for next Tuesday was confirmed.

## 2019-03-05 ENCOUNTER — Inpatient Hospital Stay: Payer: BC Managed Care – PPO | Attending: Hematology and Oncology

## 2019-03-05 ENCOUNTER — Other Ambulatory Visit: Payer: Self-pay

## 2019-03-05 DIAGNOSIS — C569 Malignant neoplasm of unspecified ovary: Secondary | ICD-10-CM | POA: Diagnosis not present

## 2019-03-05 DIAGNOSIS — R180 Malignant ascites: Secondary | ICD-10-CM | POA: Diagnosis not present

## 2019-03-05 DIAGNOSIS — Z79899 Other long term (current) drug therapy: Secondary | ICD-10-CM | POA: Diagnosis not present

## 2019-03-05 DIAGNOSIS — K219 Gastro-esophageal reflux disease without esophagitis: Secondary | ICD-10-CM | POA: Insufficient documentation

## 2019-03-05 DIAGNOSIS — C786 Secondary malignant neoplasm of retroperitoneum and peritoneum: Secondary | ICD-10-CM | POA: Insufficient documentation

## 2019-03-05 DIAGNOSIS — K449 Diaphragmatic hernia without obstruction or gangrene: Secondary | ICD-10-CM | POA: Diagnosis not present

## 2019-03-05 DIAGNOSIS — Z90722 Acquired absence of ovaries, bilateral: Secondary | ICD-10-CM | POA: Diagnosis not present

## 2019-03-05 DIAGNOSIS — Z9221 Personal history of antineoplastic chemotherapy: Secondary | ICD-10-CM | POA: Diagnosis not present

## 2019-03-05 DIAGNOSIS — Z90710 Acquired absence of both cervix and uterus: Secondary | ICD-10-CM | POA: Insufficient documentation

## 2019-03-05 DIAGNOSIS — I10 Essential (primary) hypertension: Secondary | ICD-10-CM | POA: Diagnosis not present

## 2019-03-06 LAB — CA 125: Cancer Antigen (CA) 125: 70.1 U/mL — ABNORMAL HIGH (ref 0.0–38.1)

## 2019-03-09 ENCOUNTER — Other Ambulatory Visit: Payer: Self-pay

## 2019-03-09 ENCOUNTER — Inpatient Hospital Stay (HOSPITAL_BASED_OUTPATIENT_CLINIC_OR_DEPARTMENT_OTHER): Payer: BC Managed Care – PPO | Admitting: Gynecologic Oncology

## 2019-03-09 ENCOUNTER — Encounter: Payer: Self-pay | Admitting: Gynecologic Oncology

## 2019-03-09 VITALS — BP 164/69 | HR 87 | Temp 98.2°F | Resp 17 | Ht 65.0 in | Wt 181.4 lb

## 2019-03-09 DIAGNOSIS — C786 Secondary malignant neoplasm of retroperitoneum and peritoneum: Secondary | ICD-10-CM

## 2019-03-09 DIAGNOSIS — Z90710 Acquired absence of both cervix and uterus: Secondary | ICD-10-CM | POA: Diagnosis not present

## 2019-03-09 DIAGNOSIS — K449 Diaphragmatic hernia without obstruction or gangrene: Secondary | ICD-10-CM | POA: Diagnosis not present

## 2019-03-09 DIAGNOSIS — R180 Malignant ascites: Secondary | ICD-10-CM | POA: Diagnosis not present

## 2019-03-09 DIAGNOSIS — I10 Essential (primary) hypertension: Secondary | ICD-10-CM | POA: Diagnosis not present

## 2019-03-09 DIAGNOSIS — Z9221 Personal history of antineoplastic chemotherapy: Secondary | ICD-10-CM

## 2019-03-09 DIAGNOSIS — Z90722 Acquired absence of ovaries, bilateral: Secondary | ICD-10-CM

## 2019-03-09 DIAGNOSIS — C569 Malignant neoplasm of unspecified ovary: Secondary | ICD-10-CM

## 2019-03-09 DIAGNOSIS — Z79899 Other long term (current) drug therapy: Secondary | ICD-10-CM | POA: Diagnosis not present

## 2019-03-09 DIAGNOSIS — K219 Gastro-esophageal reflux disease without esophagitis: Secondary | ICD-10-CM | POA: Diagnosis not present

## 2019-03-09 NOTE — Progress Notes (Signed)
Follow-up Note: Gyn-Onc  Consult was requested by Dr. Simona Huh for the evaluation of Holly [name] 63 y.o. female  CC:  Chief Complaint  Patient presents with  . Malignant neoplasm of ovary, unspecified laterality [facility])    Assessment/Plan:  Ms. Holly [name]  is a 63 y.o.  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 initial therapy completed January, 2019. Recurrence (peritoneal carcinomatosis) November, 2020.  Together we reviewed her CT scans from 02/27/19. We discussed the recurrence and the evidence supporting that surgery is not indicated. I am recommending chemotherapy with a platinum containing doublet (eg carb tax or carb doxil) followed by niraparib maintenance.  We will have her seen by Dr Alvy Bimler as soon as possible to discuss further.   HPI: Holly [name] is a 63 year old parous woman who is seen in consultation at the request of Dr Simona Huh for a large pelvic mass. The patient has 3 months of vague symptoms of fullness in the pelvis, urinary frequency and difficulty with defecation. She denies vaginal bleeding or rectal bleeding.  She was seen in an urgen care in July, 2018 and a TVUS was performed on 11/13/16 which showed a large hypoechoic lobular mass seen posterior to the uterus measuring 18.2x16.1x11.8cm, blood flow was seen along the periphery of the mass. It was considered to be likely to be a fibroid vs pelvic mass vs ovarian mass. Neither ovary was removed. Moderate amount of free fluid in the pelvis. THe uterus measures 4x10x4cm. The endometrium is 24m.  An MRI was performed on 12/05/16 which showed a 13.9x11.9x18.3cm T1 hypointense central pelvic mass favoring to arise from the right ovary. No peritoneal metastases were identified in the pelvis. They favor a solid ovarian neoplasm such as a brenner tumor or fibroma.  The patient is otherwise quite helathy. She has had  2 prior vaginal deliveries and a nissan fundoplication.   On 12/26/16 she underwent ex lap, TAH, BSO, omentectomy, radical debulking surgery. Intraoperative findings were significant for a 20cm mass from the left ovary/tube and omental cake and 4L ascites. The only residual disease at the completion of surgery was small 172mmilial implants on the intestines representing an optimal cytoreduction (R1). Final pathology revealed stage IIIC high grade serous carcinoma from the left fallopian tube metastatic to the ascites and omentum. In accordance with NCCN guidelines she was recommended to receive 6 cycles of adjuvant carboplatin and paclitaxel chemotherapy and referral for genetics counseling.  Chemotherapy with carboplatin and paclitaxel was completed between 12/26/16 to 05/05/17. She tolerated therapy very well. CA 125 at completion of therapy was normal at 10.6. CT abd/pelvis on 05/26/17 revealed no measurable disease. The patient was determined to have had a complete clinical response to primary therapy.  Genetic testing with Myrisk panel was negative for HRD associated mutations, therefore she was not considered for primary PARPi maintenance therapy .   CA 125 on 08/27/17 was normal at 9.8. CA 125 on 12/17/17 was normal at 10.4. CA 125 on 03/09/18 was normal but upwardly trending at 12.1 CA 125 on 05/26/18 was upwardly trending to 13.8. CA 125 on 09/09/18 was 23.9 CA 125 on 12/18/18 was 43.8  She has no symptoms concerning for recurrence at her last visit on 12/18/18.  . Interval Hx:   [facility] Her SBO resolved with conservative management.  CT abd/pelvis on  02/27/19 showed high-grade small bowel obstruction with a transition point in the pelvis in the area of a spiculated nodule measuring 1.3 cm against the right pelvic sidewall.  There was right hydronephrosis to the level of the pelvic desmoplastic reaction consistent with  retroperitoneal tumor extension.  There are multiple new subcentimeter hyperdense lesions scattered along the liver capsule concerning for metastases.  There are enlarged hyperenhancing gastrohepatic and portacaval nodes consistent concerning for nodal metastases.  The hydroureteronephrosis on the right was very mild.  An infraumbilical ventral abdominal hernia was identified containing the transverse colon that was not incarcerated.  CA 125 on 03/05/19 was elevated to 70.1.   She was determined to have recurrent disease.   Since discharge from the hospital 03/01/19.     Current Meds:  Outpatient Encounter Medications as of 03/09/2019  Medication Sig  . acetaminophen (TYLENOL) 325 MG tablet Take 975 mg by mouth every 6 (six) hours as needed (pain).  Marland Kitchen augmented betamethasone dipropionate (DIPROLENE-AF) 0.05 % cream Apply 1 application topically daily as needed (dermatitis).   . ondansetron (ZOFRAN) 4 MG tablet Take 1 tablet (4 mg total) by mouth every 6 (six) hours as needed for nausea.  Marland Kitchen triamcinolone cream (KENALOG) 0.1 % Apply 1 application topically daily as needed (dermatitis).   . venlafaxine XR (EFFEXOR-XR) 37.5 MG 24 hr capsule TAKE 1 CAPSULE BY MOUTH DAILY WITH BREAKFAST (Patient taking differently: Take 37.5 mg by mouth daily with breakfast. )   Facility-Administered Encounter Medications as of 03/09/2019  Medication  . sodium chloride flush (NS) 0.9 % injection 10 mL  . sodium chloride flush (NS) 0.9 % injection 10 mL    Allergy:  Allergies  Allergen Reactions  . Dilaudid [Hydromorphone Hcl] Nausea And Vomiting  . Morphine And Related Nausea Only  . Sudafed [Pseudoephedrine Hcl] Other (See Comments)    Nervous/jittery    Social Hx:   Social History   Socioeconomic History  . Marital status: Married    Spouse name: [name]  . Number of children: 4  . Years of education: Not on file  . Highest education level: Not on file  Occupational History  . Occupation: Technical sales engineer  . Financial resource strain: Not on file  . Food insecurity    Worry: Not on file    Inability: Not on file  . Transportation needs    Medical: Not on file    Non-medical: Not on file  Tobacco Use  . Smoking status: Never Smoker  . Smokeless tobacco: Never Used  Substance and Sexual Activity  . Alcohol use: No  . Drug use: No  . Sexual activity: Not on file  Lifestyle  . Physical activity    Days per week: Not on file    Minutes per session: Not on file  . Stress: Not on file  Relationships  . Social Herbalist on phone: Not on file    Gets together: Not on file    Attends religious service: Not on file    Active member of club or organization: Not on file    Attends meetings of clubs or organizations: Not on file    Relationship status: Not on file  . Intimate partner violence    Fear of current or ex partner: Not on file    Emotionally abused: Not on file    Physically abused: Not on file    Forced sexual activity: Not on file  Other Topics Concern  . Not on  file  Social History Narrative  . Not on file    Past Surgical Hx:  Past Surgical History:  Procedure Laterality Date  . DEBULKING N/A 12/26/2016   Procedure: RADICAL TUMOR DEBULKING; OMENTECTOMY;  Surgeon: Everitt Amber, MD;  Location: [facility];  Service: Gynecology;  Laterality: N/A;  . ganglion cyst removed    . Hital Hernia    . IR FLUORO GUIDE PORT INSERTION RIGHT  01/16/2017  . IR REMOVAL TUN ACCESS W/ PORT W/O FL MOD SED  06/10/2017  . IR US GUIDE VASC ACCESS RIGHT  01/16/2017  . LAPAROTOMY N/A 12/26/2016   Procedure: EXPLORATORY LAPAROTOMY;  Surgeon: Everitt Amber, MD;  Location: [facility];  Service: Gynecology;  Laterality: N/A;  . left morton's neuroma surgery     . Nissan Fundoplication      Past Medical Hx:  Past Medical History:  Diagnosis Date  . Complication of anesthesia    slow to wake up sometimes   . GERD (gastroesophageal reflux disease)   . History of chemotherapy   .  History of hiatal hernia   . Hypertension   . Ovarian cancer ([place])   . PONV (postoperative nausea and vomiting)     Past Gynecological History:  SVD x 2 No LMP recorded. Patient has had a hysterectomy.  Family Hx:  Family History  Problem Relation Age of Onset  . Diabetes Mother   . Diabetes Father   . Hypertension Father   . Stroke Father   . COPD Father   . Heart attack Sister 44  . Dementia Maternal Aunt   . Bone cancer Paternal Uncle        deceased at 65  . Dementia Maternal Grandmother   . Heart attack Paternal Grandmother   . Heart attack Paternal Grandfather   . Healthy Son   . Healthy Son     Review of Systems:  Constitutional  Feels well,    ENT Normal appearing ears and nares bilaterally Skin/Breast  No rash Cardiovascular  No chest pain, shortness of breath, or edema  Pulmonary  No cough or wheeze.  Gastro Intestinal  No nausea, vomitting, or diarrhoea. No bright red blood per rectum, no abdominal pain, change in bowel movement, or constipation.  Genito Urinary  No frequency, no urgency, dysuria, bleeding Musculo Skeletal  No myalgia, arthralgia, joint swelling or pain  Neurologic  No weakness, numbness, change in gait,  Psychology  No depression, anxiety, insomnia.   Vitals:  Blood pressure (!) 164/69, pulse 87, temperature 98.2 F (36.8 C), temperature source Temporal, resp. rate 17, height _0  (1.651 m), weight 181 lb 6.4 oz (82.3 kg), SpO2 98 %.  Physical Exam: WD in NAD Neck  Supple NROM, without any enlargements.  Lymph Node Survey No cervical supraclavicular or inguinal adenopathy Cardiovascular  Pulse normal rate, regularity and rhythm. S1 and S2 normal.  Lungs  Clear to auscultation bilateraly, without wheezes/crackles/rhonchi. Good air movement.  Skin  No rash/lesions/breakdown  Psychiatry  Alert and oriented to person, place, and time  Abdomen  Normoactive bowel sounds, abdomen soft, non-tender and nonobese without evidence  of hernia. Well healed right paramedian incision, soft, no masses. Back No CVA tenderness Genito Urinary  Vulva/vagina: Normal external female genitalia.   No lesions. No discharge or bleeding. Vaginal cuff in tact and well healed. Rectal  deferred Extremities  Resolved rash  Thereasa Solo, MD  03/09/2019, 5:28 PM

## 2019-03-09 NOTE — Patient Instructions (Signed)
It appears from the scans that the cancer has returned in small dots on the surface of the liver, blood supply to the intestine and deep in the pelvis where it is kinking the bowel.  We will treat this with more chemotherapy (at least 6 doses) followed by a tablet "chemo" called niraparib,  Dr Denman George recommends avoiding foods like seeds, popcorn, corn kernels or other hard objects that are hard to digest until the cancer has shrunk away from the bowels.   Dr Serita Grit office is coordinating an appointment with Dr Alvy Bimler to discuss chemo as soon as possible.

## 2019-03-10 ENCOUNTER — Telehealth: Payer: Self-pay | Admitting: Oncology

## 2019-03-10 ENCOUNTER — Other Ambulatory Visit: Payer: Self-pay

## 2019-03-10 ENCOUNTER — Telehealth: Payer: Self-pay

## 2019-03-10 ENCOUNTER — Inpatient Hospital Stay (HOSPITAL_BASED_OUTPATIENT_CLINIC_OR_DEPARTMENT_OTHER): Payer: BC Managed Care – PPO | Admitting: Hematology and Oncology

## 2019-03-10 ENCOUNTER — Telehealth: Payer: Self-pay | Admitting: Hematology and Oncology

## 2019-03-10 VITALS — BP 153/73 | HR 75 | Temp 97.4°F | Resp 18 | Ht 65.0 in | Wt 181.8 lb

## 2019-03-10 DIAGNOSIS — I10 Essential (primary) hypertension: Secondary | ICD-10-CM

## 2019-03-10 DIAGNOSIS — C5701 Malignant neoplasm of right fallopian tube: Secondary | ICD-10-CM | POA: Diagnosis not present

## 2019-03-10 DIAGNOSIS — K449 Diaphragmatic hernia without obstruction or gangrene: Secondary | ICD-10-CM | POA: Diagnosis not present

## 2019-03-10 DIAGNOSIS — Z79899 Other long term (current) drug therapy: Secondary | ICD-10-CM | POA: Diagnosis not present

## 2019-03-10 DIAGNOSIS — K219 Gastro-esophageal reflux disease without esophagitis: Secondary | ICD-10-CM | POA: Diagnosis not present

## 2019-03-10 DIAGNOSIS — Z90710 Acquired absence of both cervix and uterus: Secondary | ICD-10-CM | POA: Diagnosis not present

## 2019-03-10 DIAGNOSIS — Z7189 Other specified counseling: Secondary | ICD-10-CM

## 2019-03-10 DIAGNOSIS — G620 Drug-induced polyneuropathy: Secondary | ICD-10-CM

## 2019-03-10 DIAGNOSIS — T451X5A Adverse effect of antineoplastic and immunosuppressive drugs, initial encounter: Secondary | ICD-10-CM

## 2019-03-10 DIAGNOSIS — R5381 Other malaise: Secondary | ICD-10-CM | POA: Diagnosis not present

## 2019-03-10 DIAGNOSIS — R180 Malignant ascites: Secondary | ICD-10-CM | POA: Diagnosis not present

## 2019-03-10 DIAGNOSIS — Z9221 Personal history of antineoplastic chemotherapy: Secondary | ICD-10-CM | POA: Diagnosis not present

## 2019-03-10 DIAGNOSIS — C786 Secondary malignant neoplasm of retroperitoneum and peritoneum: Secondary | ICD-10-CM | POA: Diagnosis not present

## 2019-03-10 DIAGNOSIS — C569 Malignant neoplasm of unspecified ovary: Secondary | ICD-10-CM | POA: Diagnosis not present

## 2019-03-10 DIAGNOSIS — Z90722 Acquired absence of ovaries, bilateral: Secondary | ICD-10-CM | POA: Diagnosis not present

## 2019-03-10 MED ORDER — LIDOCAINE-PRILOCAINE 2.5-2.5 % EX CREA: TOPICAL_CREAM | CUTANEOUS | 3 refills | Status: DC

## 2019-03-10 MED ORDER — LIDOCAINE-PRILOCAINE 2.5-2.5 % EX CREA: 1.0000 "application " | TOPICAL_CREAM | Freq: Once | CUTANEOUS | 9 refills | Status: AC

## 2019-03-10 MED ORDER — ONDANSETRON HCL 8 MG PO TABS: 8.0000 mg | ORAL_TABLET | Freq: Three times a day (TID) | ORAL | 1 refills | Status: DC | PRN

## 2019-03-10 MED ORDER — PROCHLORPERAZINE MALEATE 10 MG PO TABS: 10.0000 mg | ORAL_TABLET | Freq: Four times a day (QID) | ORAL | 1 refills | Status: DC | PRN

## 2019-03-10 MED FILL — PROCHLORPERAZINE 10 MG TAB: 10 | 7 days supply | Qty: 30 | Fill #0

## 2019-03-10 MED FILL — ONDANSETRON HCL 8 MG TABLET: 8 | 10 days supply | Qty: 30 | Fill #0

## 2019-03-10 NOTE — Telephone Encounter (Signed)
-----   Message from Heath Lark, MD sent at 03/10/2019 11:27 AM EST ----- Regarding: need port and ECHO done before chemo, planning to start 11/27, pls schedule ECHO.

## 2019-03-10 NOTE — Progress Notes (Signed)
DISCONTINUE ON PATHWAY REGIMEN - Ovarian     A cycle is every 21 days:     Paclitaxel      Carboplatin   **Always confirm dose/schedule in your pharmacy ordering system**  REASON: Other Reason PRIOR TREATMENT: OVOS75: Carboplatin AUC=6 + Paclitaxel 175 mg/m2 q21 Days x 6 Cycles for Complete Responders or 2 Cycles Past Best Response for Partial Responders TREATMENT RESPONSE: Complete Response (CR)  START ON PATHWAY REGIMEN - Ovarian     A cycle is every 28 days:     Carboplatin      Liposomal doxorubicin   **Always confirm dose/schedule in your pharmacy ordering system**  Patient Characteristics: Recurrent or Progressive Disease, Second Line Therapy, Platinum Sensitive and ? 6 Months Since Last Therapy Therapeutic Status: Recurrent or Progressive Disease BRCA Mutation Status: Absent Line of Therapy: Second Line  Intent of Therapy: Non-Curative / Palliative Intent, Discussed with Patient

## 2019-03-10 NOTE — Telephone Encounter (Signed)
Called and given echo and port appt details with date/time. She verbalized understanding. She is requesting handicap sign for car. Told her I would ask Dr. Alvy Bimler and call her back. She verbalized understanding.

## 2019-03-10 NOTE — Telephone Encounter (Signed)
Scheduled appt per 11/18 sch message - pt is aware of appt date and time  

## 2019-03-10 NOTE — Telephone Encounter (Signed)
Holly [name] with appointment to see Dr. Alvy Bimler today at 10:30.  Advised her to bring a family member.

## 2019-03-11 ENCOUNTER — Telehealth: Payer: Self-pay

## 2019-03-11 ENCOUNTER — Encounter: Payer: Self-pay | Admitting: Hematology and Oncology

## 2019-03-11 NOTE — Telephone Encounter (Signed)
RN spoke with patient to notify her that her handicap form for place card was completed by MD.    Pt will pick up on 11/20.  RN placed up front for pick up.

## 2019-03-11 NOTE — Progress Notes (Signed)
[place] OFFICE PROGRESS NOTE  Patient Care Team: Gaynelle Arabian, MD as PCP - General (Family Medicine) Heath Lark, MD as Consulting Physician (Hematology and Oncology) Everitt Amber, MD as Consulting Physician (Obstetrics and Gynecology)  ASSESSMENT & PLAN:  Adenocarcinoma of right fallopian tube [facility]) Unfortunately, the patient has recurrent disease Imaging study showed new lesions in the liver, highly suspicious for metastatic cancer spread to the liver Currently, she is not symptomatic We discussed treatment options  We reviewed the current guidelines The recommendation is based on publication below:  [place] (2012) 107, [phone] & 2012 [employer] [place] All rights reserved 0007 - 0920/12  Final overall survival results of phase III [facility] CALYPSO trial of pegylated liposomal doxorubicin and carboplatin vs paclitaxel and carboplatin in platinum-sensitive ovarian cancer patients  BACKGROUND:  The CALYPSO phase III trial compared CD (carboplatin-pegylated liposomal doxorubicin (PLD)) with CP (carboplatinpaclitaxel) in patients with platinum-sensitive recurrent ovarian cancer (ROC). Overall survival (OS) data are now mature. METHODS:  Women with ROC relapsing 46 months after first- or second-line therapy were randomised to CD or CP for six cycles in this international, open-label, non-inferiority trial. The primary endpoint was progression-free survival. The OS analysis is presented here. RESULTS:  A total of 976 patients were randomised (467 to CD and 509 to CP). With a median follow-up of 49 months, no statistically significant difference was observed between arms in OS (hazard ratio0.99 (95% confidence interval 0.85, 1.16); log-rank P0.94). Median survival times were 30.7 months (CD) and 33.0 months (CP). No statistically significant difference in OS was observed between arms in predetermined subgroups according to age, body mass index, treatment-free  interval, measurable disease, number of lines of prior chemotherapy, or performance status. Post-study cross-over was imbalanced between arms, with a greater proportion of patients randomised to CP receiving post-study PLD (68%) than patients randomised to CD receiving post-study paclitaxel (43%; Po0.001). CONCLUSION:  Carboplatin-PLD led to delayed progression and similar OS compared with carboplatin-paclitaxel in platinum-sensitive ROC.  We discussed the role of chemotherapy. The intent is of palliative intent.  We discussed some of the risks, benefits, side-effects of carboplatin and liposomal doxorubicin  Some of the short term side-effects included, though not limited to, including weight loss, life threatening infections, risk of allergic reactions, need for transfusions of blood products, nausea, vomiting, change in bowel habits, loss of hair, risk of congestive heart failure, admission to hospital for various reasons, and risks of death.   Long term side-effects are also discussed including risks of infertility, permanent damage to nerve function, hearing loss, chronic fatigue, kidney damage with possibility needing hemodialysis, and rare secondary malignancy including bone marrow disorders.  The patient is aware that the response rates discussed earlier is not guaranteed.  After a long discussion, patient made an informed decision to proceed with the prescribed plan of care.  We discussed the use of cardiac imaging before and during treatment and close monitoring for signs of heart failure while on treatment  I recommend 3 cycles of treatment followed by repeat imaging study In the interim, I will also order CA-125 to follow If she has great response to treatment with no concerns for recurrent bowel obstruction, I plan to introduce and add bevacizumab down the road After approximately 6-8 cycles of chemotherapy, once we have achieved maximum response of therapy, she will undergo  maintenance treatment either with bevacizumab or niraparib I will order port placement prior to treatment    Peripheral neuropathy due to chemotherapy [facility]) Due  to some mild residual peripheral neuropathy from prior treatment, I do not plan to recommend paclitaxel at this point  Physical debility The patient have stage IV disease She is somewhat debilitated I recommend consideration for application for long-term disability I have spent some time completing application for disability parking placard for her  Goals of care, counseling/discussion I discussed extensively with the patient and her husband With stage IV disease, her condition is considered incurable At this point, we have several options available but all treatment is considered palliative in nature We discussed the importance of advanced directive and living will I recommend consideration to apply for permanent disability from work as she will need lifelong chemotherapy  We discussed the importance of influenza vaccination but she is undecided I highly recommend she gets influenza vaccination before we start her on treatment   Orders Placed This Encounter  Procedures  . IR IMAGING GUIDED PORT INSERTION    Standing Status:   Future    Standing Expiration Date:   05/09/2020    Order Specific Question:   Reason for Exam (SYMPTOM  OR DIAGNOSIS REQUIRED)    Answer:   need port for chemo    Order Specific Question:   Preferred Imaging Location?    Answer:   [facility]  . CBC with Differential ([place] Only)    Standing Status:   Standing    Number of Occurrences:   20    Standing Expiration Date:   03/09/2020  . CMP ([place] only)    Standing Status:   Standing    Number of Occurrences:   20    Standing Expiration Date:   03/09/2020  . CA 125    Standing Status:   Standing    Number of Occurrences:   11    Standing Expiration Date:   03/09/2020  . ECHOCARDIOGRAM COMPLETE    Standing Status:    Future    Standing Expiration Date:   06/09/2020    Order Specific Question:   Where should this test be performed    Answer:   [place]    Order Specific Question:   Perflutren DEFINITY (image enhancing agent) should be administered unless hypersensitivity or allergy exist    Answer:   Administer Perflutren    Order Specific Question:   Reason for exam-Echo    Answer:   Chemotherapy evaluation  v87.41 / v58.11    INTERVAL HISTORY: Please see below for problem oriented charting. She returns with her husband for further evaluation Since last time I saw her, she has been getting serial physical examination and tumor marker monitoring over the last year and a half Most recently, she was noted to have mildly elevated tumor marker but remained asymptomatic Unfortunately, she presented to the hospital approximately 2 weeks ago with signs and symptoms of bowel obstruction CT imaging that was performed showed evidence of abnormalities in her abdomen along with signs of disease in her lymph nodes and liver She was successfully treated with conservative management and discharged home She denies recent nausea or abdominal pain She had normal bowel habits recently Her performance status is close to normal She continues to work up until recent hospitalization She denies recent weight loss  I have reviewed her records extensively and summarize her oncologic history as follows  SUMMARY OF ONCOLOGIC HISTORY: Oncology History Overview Note  Neg genetics   Adenocarcinoma of right fallopian tube ([place])  08/12/2016 Initial Diagnosis   The patient has many months of  vague symptoms of fullness in the pelvis, urinary frequency and difficulty with defecation. She denies vaginal bleeding or rectal bleeding.    08/12/2016 Imaging   Abdomen X-ray in the ER: Moderate colonic stool burden without evidence of enteric obstruction   11/13/2016 Imaging   TVUS was performed on 11/13/16 which showed a large  hypoechoic lobular mass seen posterior to the uterus measuring 18.2x16.1x11.8cm, blood flow was seen along the periphery of the mass. It was considered to be likely to be a fibroid vs pelvic mass vs ovarian mass. Neither ovary was removed. Moderate amount of free fluid in the pelvis. THe uterus measures 4x10x4cm. The endometrium is 29m.    12/05/2016 Imaging   MR pelvis 1. Mild limitations as detailed above. 2. Heterogeneous pelvic mass is favored to arise from the right ovary. Favor a solid ovarian neoplasm such as fibroma/Brenner's tumor. Given lesion size and heterogeneous T2 signal out, complicating torsion cannot be excluded. 3. Moderate abdominopelvic ascites, without specific evidence of peritoneal metastasis. Consider further evaluation with contrast-enhanced abdominopelvic CT.    12/26/2016 Pathology Results   1. Uterus, ovaries and fallopian tubes - ADENOCARCINOMA INVOLVING RIGHT FALLOPIAN TUBE, RIGHT AND LEFT OVARIES, UTERINE SEROSA AND PELVIC MASS. - SEE ONCOLOGY TABLE AND COMMENT. - UTERINE CERVIX, ENDOMETRIUM, MYOMETRIUM AND LEFT FALLOPIAN TUBE FREE OF TUMOR. 2. Omentum, resection for tumor - ADENOCARCINOMA. Microscopic Comment 1. ONCOLOGY TABLE - FALLOPIAN TUBE. SEE COMMENT 1. Specimen, including laterality: Uterus, bilateral adnexa, pelvic mass and omentum. 2. Procedure: Hysterectomy with bilateral salpingo-oophorectomy, pelvic mass excision and omentectomy. 3. Lymph node sampling performed: No 4. Tumor site: See comment. 5. Tumor location in fallopian tube: Right fallopian tube fimbria 6. Specimen integrity (intact/ruptured/disrupted): Intact 7. Tumor size (cm): 18 cm, see comment. 8. Histologic type: Adenocarcinoma 9. Grade: High grade 10. Microscopic tumor extension: Tumor involves right fallopian tube, right and left ovaries, uterine serosa, pelvic mass and omentum. 11. Margins: See comment. 12. Lymph-Vascular invasion: Present 13. Lymph nodes: # examined: 0; #  positive: N/A 14. TNM: pT3c, pNX 15. FIGO Stage (based on pathologic findings, needs clinical correlation: III-C 16. Comments: There is an 18 cm pelvic mass which is a high grade adenocarcinoma and there is a 12.2 cm  segment of omentum which is extensively involved with adenocarcinoma. The tumor also involves the fimbria of the right fallopian tube and the parenchyma of the right and left ovaries as well as uterine serosa. The fimbria of the right fallopian has intraepithelial atypia consistent with a precursor lesion and therefore this is most consistent with primary fallopian tube adenocarcinoma. A primary peritoneal serous carcinoma is a less likely possibility.    12/26/2016 Pathology Results   PERITONEAL/ASCITIC FLUID(SPECIMEN 1 OF 1 COLLECTED 12/26/16): POORLY DIFFERENTIATED ADENOCARCINOMA   12/26/2016 Surgery   Procedure(s) Performed: Exploratory laparotomy with total abdominal hysterectomy, bilateral salpingo-oophorectomy, omentectomy radical tumor debulking for ovarian cancer .  Surgeon: EThereasa Solo MD.   Operative Findings: 20cm left ovarian mass densely adherent to the posterior uterus, right tube and ovary, cervix, sigmoid colon. 10cm omental cake. 4L ascites. 164msize tumor nodules on the serosa of the terminal ileum and proximal sigmoid colon. 45m17modules on right diaphragm.    This represented an optimal cytoreduction (R1) with 45mm545mdules on intestine and diaphragm representing gross visible disease   01/16/2017 Procedure   Placement of single lumen port a cath via right internal jugular vein. The catheter tip lies at the cavo-atrial junction. A power injectable port a cath was placed and is  ready for immediate use.   01/17/2017 Imaging   1. 3.5 x 7.2 x 4.6 cm fluid collection along the vaginal cuff, posterior the bladder and extending into the right adnexal space. This lesion demonstrates rim enhancement. Imaging features could be related to a loculated postoperative seroma or  hematoma. Superinfection cannot be excluded by CT. Given debulking surgery was 3 weeks ago, this entire structure is un likely to represent neoplasm, but peritoneal involvement could have this appearance. 2. Small fluid collection in the left para colic gutter without rim enhancement. 3. Irregular/nodular appearance of the peritoneal M in the anatomic pelvis with areas of subtle nodularity between the stomach and the spleen. Close attention in these regions on follow-up recommended as metastatic disease is a concern. 4. Mildly enlarged hepatoduodenal ligament lymph node. Attention on follow-up recommended.   01/20/2017 Tumor Marker   Patient's tumor was tested for the following markers: CA-125 Results of the tumor marker test revealed 46.6   01/28/2017 Genetic Testing       Negative genetic testing on the [facility] Myrisk panel.  The MyRisk gene panel offered by [facility] includes sequencing and deletion/duplication testing of the following 28 genes: APC, ATM, BARD1, BMPR1A, BRCA1, BRCA2, BRIP1, CHD1, CDK4, CDKN2A, CHEK2, EPCAM (large rearrangement only), MLH1, MSH2, MSH6, MUTYH, NBN, PALB2, PMS2, PTEN, RAD51C, RAD51D, SMAD4, STK11, and TP53. Sequencing was performed for select regions of POLE and POLD1, and large rearrangement analysis was performed for select regions of GREM1. The report date is January 28, 2017.  HRD testing looking for genomic instability and BRCA mutations was negative.  The report date of this test is January 27, 2017.    01/31/2017 Tumor Marker   Patient's tumor was tested for the following markers: CA-125 Results of the tumor marker test revealed 22.4   03/04/2017 Tumor Marker   Patient's tumor was tested for the following markers: CA-125 Results of the tumor marker test revealed 13.8   04/18/2017 Tumor Marker   Patient's tumor was tested for the following markers: CA-125 Results of the tumor marker test revealed 11.9   05/05/2017 Tumor Marker    Patient's tumor was tested for the following markers: CA-125 Results of the tumor marker test revealed 12   05/26/2017 Tumor Marker   Patient's tumor was tested for the following markers: CA-125 Results of the tumor marker test revealed 10.6   05/26/2017 Imaging   1. A previously noted postoperative fluid collection in the low pelvis and residual ascites seen on the prior examination has resolved on today's study. No findings to suggest residual/recurrent disease on today's examination. No definite solid organ metastasis identified in the abdomen or pelvis. No lymphadenopathy. 2. Aortic atherosclerosis.   06/10/2017 Procedure   Successful right IJ vein Port-A-Cath explant.   03/09/2018 Tumor Marker   Patient's tumor was tested for the following markers: CA-125 Results of the tumor marker test revealed 12.1   05/26/2018 Tumor Marker   Patient's tumor was tested for the following markers: CA-125 Results of the tumor marker test revealed 13.8   09/09/2018 Tumor Marker   Patient's tumor was tested for the following markers: CA-125 Results of the tumor marker test revealed 23.9   12/18/2018 Tumor Marker   Patient's tumor was tested for the following markers: CA-125 Results of the tumor marker test revealed 43.8   02/27/2019 - 02/28/2019 Hospital Admission   She was admitted for bowel obstruction   02/27/2019 Imaging   1. High-grade small bowel obstruction with transition point in the pelvis   in an area of suspected desmoplastic reaction surrounding a 1.3 cm spiculated nodule in the mesentery, suspicious for carcinoid. There is hyperenhancement near the tip of the appendix and loss of the normal fat plane between it and the adjacent sigmoid colon, potentially the site of primary lesion. 2. Multiple new small subcentimeter hyperdense lesions scattered along the liver capsule, concerning for metastases. Enlarged hyperenhancing gastrohepatic and portacaval lymph nodes concerning for nodal  metastases. 3. Very mild right hydroureteronephrosis to the level of the desmoplastic reaction in the pelvis, potentially involving the right ureter. 4. Infraumbilical ventral abdominal wall diastasis containing a small nondilated portion of transverse colon. 5. Trace ascites. 6. Cholelithiasis. 7.  Aortic atherosclerosis (ICD10-I70.0).     03/05/2019 Tumor Marker   Patient's tumor was tested for the following markers: CA-125 Results of the tumor marker test revealed 70.1     REVIEW OF SYSTEMS:   Constitutional: Denies fevers, chills or abnormal weight loss Eyes: Denies blurriness of vision Ears, nose, mouth, throat, and face: Denies mucositis or sore throat Respiratory: Denies cough, dyspnea or wheezes Cardiovascular: Denies palpitation, chest discomfort or lower extremity swelling Gastrointestinal:  Denies nausea, heartburn or change in bowel habits Skin: Denies abnormal skin rashes Lymphatics: Denies new lymphadenopathy or easy bruising Neurological:Denies numbness, tingling or new weaknesses Behavioral/Psych: Mood is stable, no new changes  All other systems were reviewed with the patient and are negative.  I have reviewed the past medical history, past surgical history, social history and family history with the patient and they are unchanged from previous note.  ALLERGIES:  is allergic to dilaudid [hydromorphone hcl]; morphine and related; and sudafed [pseudoephedrine hcl].  MEDICATIONS:  Current Outpatient Medications  Medication Sig Dispense Refill  . acetaminophen (TYLENOL) 325 MG tablet Take 975 mg by mouth every 6 (six) hours as needed (pain).    . ondansetron (ZOFRAN) 4 MG tablet Take 1 tablet (4 mg total) by mouth every 6 (six) hours as needed for nausea. 20 tablet 0  . ondansetron (ZOFRAN) 8 MG tablet Take 1 tablet (8 mg total) by mouth every 8 (eight) hours as needed. 30 tablet 1  . prochlorperazine (COMPAZINE) 10 MG tablet Take 1 tablet (10 mg total) by mouth  every 6 (six) hours as needed (Nausea or vomiting). 30 tablet 1  . venlafaxine XR (EFFEXOR-XR) 37.5 MG 24 hr capsule TAKE 1 CAPSULE BY MOUTH DAILY WITH BREAKFAST (Patient taking differently: Take 37.5 mg by mouth daily with breakfast. ) 90 capsule 6   No current facility-administered medications for this visit.    Facility-Administered Medications Ordered in Other Visits  Medication Dose Route Frequency Provider Last Rate Last Dose  . sodium chloride flush (NS) 0.9 % injection 10 mL  10 mL Intravenous PRN Gorsuch, Ni, MD   10 mL at 02/11/17 0839  . sodium chloride flush (NS) 0.9 % injection 10 mL  10 mL Intravenous PRN Gorsuch, Ni, MD   10 mL at 03/04/17 1510    PHYSICAL EXAMINATION: ECOG PERFORMANCE STATUS: 1 - Symptomatic but completely ambulatory  Vitals:   03/10/19 1043  BP: (!) 153/73  Pulse: 75  Resp: 18  Temp: (!) 97.4 F (36.3 C)  SpO2: 100%   Filed Weights   03/10/19 1043  Weight: 181 lb 12.8 oz (82.5 kg)    GENERAL:alert, no distress and comfortable SKIN: skin color, texture, turgor are normal, no rashes or significant lesions EYES: normal, Conjunctiva are pink and non-injected, sclera clear OROPHARYNX:no exudate, no erythema and lips, buccal mucosa, and tongue   normal  NECK: supple, thyroid normal size, non-tender, without nodularity LYMPH:  no palpable lymphadenopathy in the cervical, axillary or inguinal LUNGS: clear to auscultation and percussion with normal breathing effort HEART: regular rate & rhythm and no murmurs and no lower extremity edema ABDOMEN:abdomen soft, non-tender and normal bowel sounds Musculoskeletal:no cyanosis of digits and no clubbing  NEURO: alert & oriented x 3 with fluent speech, no focal motor/sensory deficits  LABORATORY DATA:  I have reviewed the data as listed    Component Value Date/Time   NA 140 02/28/2019 0242   NA 139 04/14/2017 0742   K 3.9 02/28/2019 0242   K 4.0 04/14/2017 0742   CL 105 02/28/2019 0242   CO2 27  02/28/2019 0242   CO2 21 (L) 04/14/2017 0742   GLUCOSE 148 (H) 02/28/2019 0242   GLUCOSE 247 (H) 04/14/2017 0742   BUN 10 02/28/2019 0242   BUN 13.1 04/14/2017 0742   CREATININE 1.11 (H) 02/28/2019 0242   CREATININE 0.9 04/14/2017 0742   CALCIUM 8.9 02/28/2019 0242   CALCIUM 9.2 04/14/2017 0742   PROT 6.5 02/28/2019 0242   PROT 7.3 04/14/2017 0742   ALBUMIN 3.4 (L) 02/28/2019 0242   ALBUMIN 3.9 04/14/2017 0742   AST 19 02/28/2019 0242   AST 17 04/14/2017 0742   ALT 16 02/28/2019 0242   ALT 14 04/14/2017 0742   ALKPHOS 66 02/28/2019 0242   ALKPHOS 70 04/14/2017 0742   BILITOT 0.7 02/28/2019 0242   BILITOT 0.37 04/14/2017 0742   GFRNONAA 52 (L) 02/28/2019 0242   GFRAA >60 02/28/2019 0242    No results found for: SPEP, UPEP  Lab Results  Component Value Date   WBC 9.3 02/27/2019   NEUTROABS 7.4 02/27/2019   HGB 14.8 02/27/2019   HCT 44.0 02/27/2019   MCV 93.4 02/27/2019   PLT 229 02/27/2019      Chemistry      Component Value Date/Time   NA 140 02/28/2019 0242   NA 139 04/14/2017 0742   K 3.9 02/28/2019 0242   K 4.0 04/14/2017 0742   CL 105 02/28/2019 0242   CO2 27 02/28/2019 0242   CO2 21 (L) 04/14/2017 0742   BUN 10 02/28/2019 0242   BUN 13.1 04/14/2017 0742   CREATININE 1.11 (H) 02/28/2019 0242   CREATININE 0.9 04/14/2017 0742      Component Value Date/Time   CALCIUM 8.9 02/28/2019 0242   CALCIUM 9.2 04/14/2017 0742   ALKPHOS 66 02/28/2019 0242   ALKPHOS 70 04/14/2017 0742   AST 19 02/28/2019 0242   AST 17 04/14/2017 0742   ALT 16 02/28/2019 0242   ALT 14 04/14/2017 0742   BILITOT 0.7 02/28/2019 0242   BILITOT 0.37 04/14/2017 0742       RADIOGRAPHIC STUDIES: I have personally reviewed the radiological images as listed and agreed with the findings in the report. Ct Abdomen Pelvis W Contrast  Result Date: 02/27/2019 CLINICAL DATA:  Epigastric and suprapubic pain.  Dysuria. EXAM: CT ABDOMEN AND PELVIS WITH CONTRAST TECHNIQUE: Multidetector CT  imaging of the abdomen and pelvis was performed using the standard protocol following bolus administration of intravenous contrast. CONTRAST:  100mL OMNIPAQUE IOHEXOL 300 MG/ML  SOLN COMPARISON:  CT abdomen pelvis dated May 26, 2017. FINDINGS: Lower chest: No acute abnormality. Hepatobiliary: There are multiple new small subcentimeter hyperdense lesions scattered along the liver capsular surface. Small gallstone. No gallbladder wall thickening or biliary dilatation. Pancreas: Unremarkable. No pancreatic ductal dilatation or surrounding inflammatory changes. Spleen: Normal in size   without focal abnormality. Adrenals/Urinary Tract: Adrenal glands are unremarkable. Very mild right hydroureteronephrosis. Mild fullness of the left renal collecting system and ureter. No renal or ureteral calculi. The bladder is unremarkable. Stomach/Bowel: Unchanged small hiatal hernia with surgical clips near the gastroesophageal junction. The stomach is otherwise within normal limits. There are several borderline and mildly dilated loops of small bowel with a transition point in the pelvis, where there is a suggestion of a desmoplastic reaction surrounding a 1.3 cm spiculated nodule in the mesentery (series 6, image 50). The appendix is at the upper limits of normal in size with hyperenhancement near its tip and loss of the normal flat plane between it and the adjacent sigmoid colon (series 6, images 59-61). Infraumbilical ventral abdominal wall diastasis containing a small nondilated portion of transverse colon and a small amount of fluid. The colon is otherwise unremarkable. Vascular/Lymphatic: Aortic atherosclerosis. Enlarged hyperenhancing gastrohepatic and portacaval lymph nodes measuring up to 1.1 cm in short axis. Mildly enlarged bilateral inguinal lymph nodes measuring up to 1.3 cm in short axis. Reproductive: Status post hysterectomy. No adnexal masses. Other: Trace ascites in the pelvis.  No pneumoperitoneum.  Musculoskeletal: No acute or significant osseous findings. IMPRESSION: 1. High-grade small bowel obstruction with transition point in the pelvis in an area of suspected desmoplastic reaction surrounding a 1.3 cm spiculated nodule in the mesentery, suspicious for carcinoid. There is hyperenhancement near the tip of the appendix and loss of the normal fat plane between it and the adjacent sigmoid colon, potentially the site of primary lesion. 2. Multiple new small subcentimeter hyperdense lesions scattered along the liver capsule, concerning for metastases. Enlarged hyperenhancing gastrohepatic and portacaval lymph nodes concerning for nodal metastases. 3. Very mild right hydroureteronephrosis to the level of the desmoplastic reaction in the pelvis, potentially involving the right ureter. 4. Infraumbilical ventral abdominal wall diastasis containing a small nondilated portion of transverse colon. 5. Trace ascites. 6. Cholelithiasis. 7.  Aortic atherosclerosis (ICD10-I70.0). Electronically Signed   By: Titus Dubin M.D.   On: 02/27/2019 12:22   Dg Abd Portable 1v  Result Date: 02/28/2019 CLINICAL DATA:  Small bowel obstruction EXAM: PORTABLE ABDOMEN - 1 VIEW COMPARISON:  02/28/2019, 11/04/2016: KUB 02/27/2019: CT abdomen/pelvis FINDINGS: There is oral contrast material which has further transit into the colon to the level of the rectum. There is persistent dilated small bowel loops measuring up to 3.6 cm in diameter. There is no evidence of pneumoperitoneum, portal venous gas or pneumatosis. There are no pathologic calcifications along the expected course of the ureters. The osseous structures are unremarkable. IMPRESSION: Persistent small-bowel obstruction. Oral contrast has traversed into the colon and into the rectum suggesting a partial small bowel obstruction. Electronically Signed   By: Kathreen Devoid   On: 02/28/2019 14:01   Dg Abd Portable 1v-small Bowel Obstruction Protocol-initial, 8 Hr Delay  Result  Date: 02/28/2019 CLINICAL DATA:  Small-bowel obstruction.  A are delay film. EXAM: PORTABLE ABDOMEN - 1 VIEW COMPARISON:  February 27, 2019 CT. FINDINGS: There are persistently dilated loops of small bowel scattered throughout the abdomen. Oral contrast remains in the small bowel and stomach. Oral contrast is seen extending into the ascending colon and transverse colon. Oral contrast is not definitively identified in the descending colon or rectum. Contrast is seen within the urinary bladder. IMPRESSION: 1. Persistent small bowel obstruction. 2. Oral contrast remains within the stomach and small bowel. 3. Small amount of oral contrast is seen within the ascending and transverse colon. 4. No pneumatosis or  free air. Electronically Signed   By: Christopher  Green M.D.   On: 02/28/2019 01:48    All questions were answered. The patient knows to call the clinic with any problems, questions or concerns. No barriers to learning was detected.  I spent 60 minutes counseling the patient face to face. The total time spent in the appointment was 80 minutes and more than 50% was on counseling and review of test results  Ni Gorsuch, MD 03/11/2019 8:35 AM  

## 2019-03-11 NOTE — Assessment & Plan Note (Signed)
The patient have stage IV disease She is somewhat debilitated I recommend consideration for application for long-term disability I have spent some time completing application for disability parking placard for her

## 2019-03-11 NOTE — Assessment & Plan Note (Signed)
Unfortunately, the patient has recurrent disease Imaging study showed new lesions in the liver, highly suspicious for metastatic cancer spread to the liver Currently, she is not symptomatic We discussed treatment options  We reviewed the current guidelines The recommendation is based on publication below:  [place] (2012) 107, [phone] & 2012 [employer] [place] All rights reserved 0007 - 0920/12  Final overall survival results of phase III [facility] CALYPSO trial of pegylated liposomal doxorubicin and carboplatin vs paclitaxel and carboplatin in platinum-sensitive ovarian cancer patients  BACKGROUND:  The CALYPSO phase III trial compared CD (carboplatin-pegylated liposomal doxorubicin (PLD)) with CP (carboplatinpaclitaxel) in patients with platinum-sensitive recurrent ovarian cancer (ROC). Overall survival (OS) data are now mature. METHODS:  Women with ROC relapsing 46 months after first- or second-line therapy were randomised to CD or CP for six cycles in this international, open-label, non-inferiority trial. The primary endpoint was progression-free survival. The OS analysis is presented here. RESULTS:  A total of 976 patients were randomised (467 to CD and 509 to CP). With a median follow-up of 49 months, no statistically significant difference was observed between arms in OS (hazard ratio0.99 (95% confidence interval 0.85, 1.16); log-rank P0.94). Median survival times were 30.7 months (CD) and 33.0 months (CP). No statistically significant difference in OS was observed between arms in predetermined subgroups according to age, body mass index, treatment-free interval, measurable disease, number of lines of prior chemotherapy, or performance status. Post-study cross-over was imbalanced between arms, with a greater proportion of patients randomised to CP receiving post-study PLD (68%) than patients randomised to CD receiving post-study paclitaxel (43%; Po0.001). CONCLUSION:   Carboplatin-PLD led to delayed progression and similar OS compared with carboplatin-paclitaxel in platinum-sensitive ROC.  We discussed the role of chemotherapy. The intent is of palliative intent.  We discussed some of the risks, benefits, side-effects of carboplatin and liposomal doxorubicin  Some of the short term side-effects included, though not limited to, including weight loss, life threatening infections, risk of allergic reactions, need for transfusions of blood products, nausea, vomiting, change in bowel habits, loss of hair, risk of congestive heart failure, admission to hospital for various reasons, and risks of death.   Long term side-effects are also discussed including risks of infertility, permanent damage to nerve function, hearing loss, chronic fatigue, kidney damage with possibility needing hemodialysis, and rare secondary malignancy including bone marrow disorders.  The patient is aware that the response rates discussed earlier is not guaranteed.  After a long discussion, patient made an informed decision to proceed with the prescribed plan of care.  We discussed the use of cardiac imaging before and during treatment and close monitoring for signs of heart failure while on treatment  I recommend 3 cycles of treatment followed by repeat imaging study In the interim, I will also order CA-125 to follow If she has great response to treatment with no concerns for recurrent bowel obstruction, I plan to introduce and add bevacizumab down the road After approximately 6-8 cycles of chemotherapy, once we have achieved maximum response of therapy, she will undergo maintenance treatment either with bevacizumab or niraparib I will order port placement prior to treatment

## 2019-03-11 NOTE — Assessment & Plan Note (Signed)
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

## 2019-03-11 NOTE — Assessment & Plan Note (Addendum)
I discussed extensively with the patient and her husband With stage IV disease, her condition is considered incurable At this point, we have several options available but all treatment is considered palliative in nature We discussed the importance of advanced directive and living will I recommend consideration to apply for permanent disability from work as she will need lifelong chemotherapy  We discussed the importance of influenza vaccination but she is undecided I highly recommend she gets influenza vaccination before we start her on treatment

## 2019-03-12 ENCOUNTER — Ambulatory Visit (HOSPITAL_COMMUNITY)
Admission: RE | Admit: 2019-03-12 | Discharge: 2019-03-12 | Disposition: A | Discharge status: Home / Self Care | Payer: BC Managed Care – PPO | Source: Ambulatory Visit | Attending: Hematology and Oncology | Admitting: Hematology and Oncology

## 2019-03-12 ENCOUNTER — Other Ambulatory Visit: Payer: Self-pay

## 2019-03-12 DIAGNOSIS — C5701 Malignant neoplasm of right fallopian tube: Secondary | ICD-10-CM | POA: Insufficient documentation

## 2019-03-12 DIAGNOSIS — I10 Essential (primary) hypertension: Secondary | ICD-10-CM

## 2019-03-12 NOTE — Progress Notes (Signed)
  Echocardiogram 2D Echocardiogram has been performed.  Donavon Kimrey G Sholonda Jobst 03/12/2019, 10:56 AM

## 2019-03-15 ENCOUNTER — Other Ambulatory Visit: Payer: Self-pay | Admitting: Oncology

## 2019-03-15 ENCOUNTER — Other Ambulatory Visit: Payer: Self-pay | Admitting: Radiology

## 2019-03-15 NOTE — Progress Notes (Signed)
Gynecologic Oncology Multi-Disciplinary Disposition Conference Note  Date of the Conference: 03/15/2019     Patient Name: GENELLA SCHOUTEN  Referring Provider: Dr. Simona Huh Primary GYN Oncologist: Dr. 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 Disposition is to 3 cycles chemotherapy with carboplatin/doxil followed by repeat imaging followed by additional 4-6 cycles of chemotherapy +/- bevacizumab followed by maintenance bevacizumab or niraparib.   This Multidisciplinary conference took place involving physicians from [place], [place], Radiation Oncology, Pathology, Radiology along with the Gynecologic Oncology Nurse Practitioner and RN.  Comprehensive assessment of the patient's malignancy, staging, need for surgery, chemotherapy, radiation therapy, and need for further testing were reviewed. Supportive measures, both inpatient and following discharge were also discussed. The recommended plan of care is documented. Greater than 35 minutes were spent correlating and coordinating this patient's care.

## 2019-03-16 ENCOUNTER — Ambulatory Visit (HOSPITAL_COMMUNITY)
Admission: RE | Admit: 2019-03-16 | Discharge: 2019-03-16 | Disposition: A | Discharge status: Home / Self Care | Payer: BC Managed Care – PPO | Source: Ambulatory Visit | Attending: Hematology and Oncology | Admitting: Hematology and Oncology

## 2019-03-16 ENCOUNTER — Other Ambulatory Visit: Payer: Self-pay | Admitting: Physician Assistant

## 2019-03-16 ENCOUNTER — Encounter (HOSPITAL_COMMUNITY): Payer: Self-pay

## 2019-03-16 ENCOUNTER — Other Ambulatory Visit: Payer: Self-pay

## 2019-03-16 DIAGNOSIS — C5701 Malignant neoplasm of right fallopian tube: Secondary | ICD-10-CM | POA: Diagnosis not present

## 2019-03-16 DIAGNOSIS — K219 Gastro-esophageal reflux disease without esophagitis: Secondary | ICD-10-CM | POA: Insufficient documentation

## 2019-03-16 DIAGNOSIS — I10 Essential (primary) hypertension: Secondary | ICD-10-CM | POA: Diagnosis not present

## 2019-03-16 DIAGNOSIS — C787 Secondary malignant neoplasm of liver and intrahepatic bile duct: Secondary | ICD-10-CM | POA: Insufficient documentation

## 2019-03-16 DIAGNOSIS — Z5111 Encounter for antineoplastic chemotherapy: Secondary | ICD-10-CM | POA: Diagnosis not present

## 2019-03-16 HISTORY — PX: IR IMAGING GUIDED PORT INSERTION: IMG5740

## 2019-03-16 LAB — CBC
HCT: 44.3 % (ref 36.0–46.0)
Hemoglobin: 14.3 g/dL (ref 12.0–15.0)
MCH: 31.2 pg (ref 26.0–34.0)
MCHC: 32.3 g/dL (ref 30.0–36.0)
MCV: 96.5 fL (ref 80.0–100.0)
Platelets: 258 10*3/uL (ref 150–400)
RBC: 4.59 MIL/uL (ref 3.87–5.11)
RDW: 12.8 % (ref 11.5–15.5)
WBC: 6 10*3/uL (ref 4.0–10.5)
nRBC: 0 % (ref 0.0–0.2)

## 2019-03-16 LAB — COMPREHENSIVE METABOLIC PANEL
ALT: 19 U/L (ref 0–44)
AST: 21 U/L (ref 15–41)
Albumin: 4.1 g/dL (ref 3.5–5.0)
Alkaline Phosphatase: 69 U/L (ref 38–126)
Anion gap: 10 (ref 5–15)
BUN: 8 mg/dL (ref 8–23)
CO2: 27 mmol/L (ref 22–32)
Calcium: 9.3 mg/dL (ref 8.9–10.3)
Chloride: 103 mmol/L (ref 98–111)
Creatinine, Ser: 0.89 mg/dL (ref 0.44–1.00)
GFR calc Af Amer: >60 mL/min (ref >60)
GFR calc non Af Amer: >60 mL/min (ref >60)
Glucose, Bld: 98 mg/dL (ref 70–99)
Potassium: 3.6 mmol/L (ref 3.5–5.1)
Sodium: 140 mmol/L (ref 135–145)
Total Bilirubin: 1 mg/dL (ref 0.3–1.2)
Total Protein: 7.1 g/dL (ref 6.5–8.1)

## 2019-03-16 LAB — PROTIME-INR
INR: 1 (ref 0.8–1.2)
Prothrombin Time: 13.5 seconds (ref 11.4–15.2)

## 2019-03-16 MED ORDER — LIDOCAINE HCL 1 % IJ SOLN
INTRAMUSCULAR | Status: AC | PRN
  Administered 2019-03-16: 10 mL

## 2019-03-16 MED ORDER — LIDOCAINE HCL 1 % IJ SOLN
INTRAMUSCULAR | Status: AC
  Filled 2019-03-16: qty 20

## 2019-03-16 MED ORDER — CEFAZOLIN SODIUM-DEXTROSE 2-4 GM/100ML-% IV SOLN
INTRAVENOUS | Status: AC | PRN
  Administered 2019-03-16: 2 g via INTRAVENOUS

## 2019-03-16 MED ORDER — FENTANYL CITRATE (PF) 100 MCG/2ML IJ SOLN
INTRAMUSCULAR | Status: AC
  Filled 2019-03-16: qty 4

## 2019-03-16 MED ORDER — SODIUM CHLORIDE 0.9 % IV SOLN: INTRAVENOUS | Status: DC

## 2019-03-16 MED ORDER — SODIUM CHLORIDE 0.9 % IV SOLN
INTRAVENOUS | Status: AC | PRN
  Administered 2019-03-16 (×2): 10 mL/h via INTRAVENOUS

## 2019-03-16 MED ORDER — CEFAZOLIN SODIUM-DEXTROSE 2-4 GM/100ML-% IV SOLN: 2.0000 g | INTRAVENOUS | Status: DC

## 2019-03-16 MED ORDER — FENTANYL CITRATE (PF) 100 MCG/2ML IJ SOLN
INTRAMUSCULAR | Status: AC | PRN
  Administered 2019-03-16 (×2): 50 ug via INTRAVENOUS

## 2019-03-16 MED ORDER — MIDAZOLAM HCL 2 MG/2ML IJ SOLN
INTRAMUSCULAR | Status: AC
  Filled 2019-03-16: qty 4

## 2019-03-16 MED ORDER — HEPARIN SOD (PORK) LOCK FLUSH 100 UNIT/ML IV SOLN
INTRAVENOUS | Status: AC
  Administered 2019-03-16: 500 [IU]
  Filled 2019-03-16: qty 5

## 2019-03-16 MED ORDER — MIDAZOLAM HCL 2 MG/2ML IJ SOLN
INTRAMUSCULAR | Status: AC | PRN
  Administered 2019-03-16 (×2): 1 mg via INTRAVENOUS

## 2019-03-16 MED ORDER — CEFAZOLIN SODIUM-DEXTROSE 2-4 GM/100ML-% IV SOLN
INTRAVENOUS | Status: AC
  Filled 2019-03-16: qty 100

## 2019-03-16 NOTE — Discharge Instructions (Addendum)
Implanted Port Insertion, Care After °This sheet gives you information about how to care for yourself after your procedure. Your health care provider may also give you more specific instructions. If you have problems or questions, contact your health care provider. °What can I expect after the procedure? °After the procedure, it is common to have: °· Discomfort at the port insertion site. °· Bruising on the skin over the port. This should improve over 3-4 days. °Follow these instructions at home: °Port care °· After your port is placed, you will get a manufacturer's information card. The card has information about your port. Keep this card with you at all times. °· Take care of the port as told by your health care provider. Ask your health care provider if you or a family member can get training for taking care of the port at home. A home health care nurse may also take care of the port. °· Make sure to remember what type of port you have. °Incision care ° °  ° °· Follow instructions from your health care provider about how to take care of your port insertion site. Make sure you: °? Wash your hands with soap and water before and after you change your bandage (dressing). If soap and water are not available, use hand sanitizer. °? Change your dressing as told by your health care provider. °? Leave stitches (sutures), skin glue, or adhesive strips in place. These skin closures may need to stay in place for 2 weeks or longer. If adhesive strip edges start to loosen and curl up, you may trim the loose edges. Do not remove adhesive strips completely unless your health care provider tells you to do that. °· Check your port insertion site every day for signs of infection. Check for: °? Redness, swelling, or pain. °? Fluid or blood. °? Warmth. °? Pus or a bad smell. °Activity °· Return to your normal activities as told by your health care provider. Ask your health care provider what activities are safe for you. °· Do not  lift anything that is heavier than 10 lb (4.5 kg), or the limit that you are told, until your health care provider says that it is safe. °General instructions °· Take over-the-counter and prescription medicines only as told by your health care provider. °· Do not take baths, swim, or use a hot tub until your health care provider approves. Ask your health care provider if you may take showers. You may only be allowed to take sponge baths. °· Do not drive for 24 hours if you were given a sedative during your procedure. °· Wear a medical alert bracelet in case of an emergency. This will tell any health care providers that you have a port. °· Keep all follow-up visits as told by your health care provider. This is important. °Contact a health care provider if: °· You cannot flush your port with saline as directed, or you cannot draw blood from the port. °· You have a fever or chills. °· You have redness, swelling, or pain around your port insertion site. °· You have fluid or blood coming from your port insertion site. °· Your port insertion site feels warm to the touch. °· You have pus or a bad smell coming from the port insertion site. °Get help right away if: °· You have chest pain or shortness of breath. °· You have bleeding from your port that you cannot control. °Summary °· Take care of the port as told by your health   care provider. Keep the manufacturer's information card with you at all times. °· Change your dressing as told by your health care provider. °· Contact a health care provider if you have a fever or chills or if you have redness, swelling, or pain around your port insertion site. °· Keep all follow-up visits as told by your health care provider. °This information is not intended to replace advice given to you by your health care provider. Make sure you discuss any questions you have with your health care provider. °Document Released: 01/27/2013 Document Revised: 11/04/2017 Document Reviewed:  11/04/2017 °Elsevier Patient Education © 2020 [employer] ° °Moderate Conscious Sedation, Adult, Care After °These instructions provide you with information about caring for yourself after your procedure. Your health care provider may also give you more specific instructions. Your treatment has been planned according to current medical practices, but problems sometimes occur. Call your health care provider if you have any problems or questions after your procedure. °What can I expect after the procedure? °After your procedure, it is common: °· To feel sleepy for several hours. °· To feel clumsy and have poor balance for several hours. °· To have poor judgment for several hours. °· To vomit if you eat too soon. °Follow these instructions at home: °For at least 24 hours after the procedure: ° °· Do not: °? Participate in activities where you could fall or become injured. °? Drive. °? Use heavy machinery. °? Drink alcohol. °? Take sleeping pills or medicines that cause drowsiness. °? Make important decisions or sign legal documents. °? Take care of children on your own. °· Rest. °Eating and drinking °· Follow the diet recommended by your health care provider. °· If you vomit: °? Drink water, juice, or soup when you can drink without vomiting. °? Make sure you have little or no nausea before eating solid foods. °General instructions °· Have a responsible adult stay with you until you are awake and alert. °· Take over-the-counter and prescription medicines only as told by your health care provider. °· If you smoke, do not smoke without supervision. °· Keep all follow-up visits as told by your health care provider. This is important. °Contact a health care provider if: °· You keep feeling nauseous or you keep vomiting. °· You feel light-headed. °· You develop a rash. °· You have a fever. °Get help right away if: °· You have trouble breathing. °This information is not intended to replace advice given to you by your  health care provider. Make sure you discuss any questions you have with your health care provider. °Document Released: 01/27/2013 Document Revised: 03/21/2017 Document Reviewed: 07/29/2015 °Elsevier Patient Education © 2020 [employer] ° °

## 2019-03-16 NOTE — Progress Notes (Signed)
Discharge instructions reviewed with pt . Voices understanding. Attempted to call her husband -message left for him to call us back.

## 2019-03-16 NOTE — Progress Notes (Signed)
Discharge instructions reviewed with pt husband [name]. Voices understanding.

## 2019-03-16 NOTE — Procedures (Signed)
Pre Procedure Dx: Poor venous access Post Procedural Dx: Same  Successful placement of right IJ approach port-a-cath with tip at the superior caval atrial junction. The catheter is ready for immediate use.  Estimated Blood Loss: Minimal  Complications: None immediate.  Jay Madisson Kulaga, MD Pager #: [phone]   

## 2019-03-16 NOTE — H&P (Signed)
Chief Complaint: Patient was seen in consultation today for [facility] a cath placement at the request of [place]  Referring Physician(s): Heath Lark  Supervising Physician: Sandi Mariscal  Patient Status: [facility] - Out-pt  History of Present Illness: Holly [name] is a 63 y.o. 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 Dx 2018  Recurrence - new liver lesions- probable metastasis  Dr Alvy Bimler recent note:  recommend 3 cycles of treatment followed by repeat imaging study In the interim, I will also order CA-125 to follow If she has great response to treatment with no concerns for recurrent bowel obstruction, I plan to introduce and add bevacizumab down the road After approximately 6-8 cycles of chemotherapy, once we have achieved maximum response of therapy, she will undergo maintenance treatment either with bevacizumab or niraparib I will order port placement prior to treatment   Scheduled now for same  Past Medical History:  Diagnosis Date  . Complication of anesthesia    slow to wake up sometimes   . GERD (gastroesophageal reflux disease)   . History of chemotherapy   . History of hiatal hernia   . Hypertension   . Ovarian cancer ([place])   . PONV (postoperative nausea and vomiting)     Past Surgical History:  Procedure Laterality Date  . DEBULKING N/A 12/26/2016   Procedure: RADICAL TUMOR DEBULKING; OMENTECTOMY;  Surgeon: Everitt Amber, MD;  Location: [facility] ORS;  Service: Gynecology;  Laterality: N/A;  . ganglion cyst removed    . Hital Hernia    . IR FLUORO GUIDE PORT INSERTION RIGHT  01/16/2017  . IR REMOVAL TUN ACCESS W/ PORT W/O FL MOD SED  06/10/2017  . IR US GUIDE VASC ACCESS RIGHT  01/16/2017  . LAPAROTOMY N/A 12/26/2016   Procedure: EXPLORATORY LAPAROTOMY;  Surgeon: Everitt Amber, MD;  Location: [facility];  Service: Gynecology;  Laterality: N/A;  . left morton's neuroma surgery     . Nissan Fundoplication      Allergies: Dilaudid [hydromorphone  hcl], Morphine and related, and Sudafed [pseudoephedrine hcl]  Medications: Prior to Admission medications   Medication Sig Start Date End Date Taking? Authorizing Provider  acetaminophen (TYLENOL) 325 MG tablet Take 975 mg by mouth every 6 (six) hours as needed (pain).    [provider]  ondansetron (ZOFRAN) 4 MG tablet Take 1 tablet (4 mg total) by mouth every 6 (six) hours as needed for nausea. 02/28/19   Oswald Hillock, MD  ondansetron (ZOFRAN) 8 MG tablet Take 1 tablet (8 mg total) by mouth every 8 (eight) hours as needed. 03/10/19   Heath Lark, MD  prochlorperazine (COMPAZINE) 10 MG tablet Take 1 tablet (10 mg total) by mouth every 6 (six) hours as needed (Nausea or vomiting). 03/10/19   Heath Lark, MD  venlafaxine XR (EFFEXOR-XR) 37.5 MG 24 hr capsule TAKE 1 CAPSULE BY MOUTH DAILY WITH BREAKFAST Patient taking differently: Take 37.5 mg by mouth daily with breakfast.  08/20/18   [name] D, NP     Family History  Problem Relation Age of Onset  . Diabetes Mother   . Diabetes Father   . Hypertension Father   . Stroke Father   . COPD Father   . Heart attack Sister 55  . Dementia Maternal Aunt   . Bone cancer Paternal Uncle        deceased at 4  . Dementia Maternal Grandmother   . Heart attack Paternal Grandmother   . Heart attack Paternal Grandfather   .  Healthy Son   . Healthy Son     Social History   Socioeconomic History  . Marital status: Married    Spouse name: [name]  . Number of children: 4  . Years of education: Not on file  . Highest education level: Not on file  Occupational History  . Occupation: Editor, commissioning  . Financial resource strain: Not on file  . Food insecurity    Worry: Not on file    Inability: Not on file  . Transportation needs    Medical: Not on file    Non-medical: Not on file  Tobacco Use  . Smoking status: Never Smoker  . Smokeless tobacco: Never Used  Substance and Sexual Activity  . Alcohol use: No  . Drug  use: No  . Sexual activity: Not on file  Lifestyle  . Physical activity    Days per week: Not on file    Minutes per session: Not on file  . Stress: Not on file  Relationships  . Social Herbalist on phone: Not on file    Gets together: Not on file    Attends religious service: Not on file    Active member of club or organization: Not on file    Attends meetings of clubs or organizations: Not on file    Relationship status: Not on file  Other Topics Concern  . Not on file  Social History Narrative  . Not on file    Review of Systems: A 12 point ROS discussed and pertinent positives are indicated in the HPI above.  All other systems are negative.  Review of Systems  Constitutional: Negative for activity change, fatigue and fever.  Respiratory: Negative for cough and shortness of breath.   Cardiovascular: Negative for chest pain.  Gastrointestinal: Negative for abdominal pain.  Neurological: Negative for weakness.  Psychiatric/Behavioral: Negative for behavioral problems and confusion.    Vital Signs: BP (!) 145/79   Pulse 67   Temp (!) 97.4 F (36.3 C) (Skin)   Resp 16   Ht 5\' 5"  (1.651 m)   Wt 180 lb (81.6 kg)   SpO2 100%   BMI 29.95 kg/m   Physical Exam Cardiovascular:     Rate and Rhythm: Normal rate and regular rhythm.     Heart sounds: Normal heart sounds.  Pulmonary:     Breath sounds: Normal breath sounds.  Abdominal:     Palpations: Abdomen is soft.  Musculoskeletal: Normal range of motion.  Skin:    General: Skin is warm and dry.  Neurological:     Mental Status: She is alert and oriented to person, place, and time.  Psychiatric:        Behavior: Behavior normal.     Imaging: Ct Abdomen Pelvis W Contrast  Result Date: 02/27/2019 CLINICAL DATA:  Epigastric and suprapubic pain.  Dysuria. EXAM: CT ABDOMEN AND PELVIS WITH CONTRAST TECHNIQUE: Multidetector CT imaging of the abdomen and pelvis was performed using the standard protocol  following bolus administration of intravenous contrast. CONTRAST:  158mL OMNIPAQUE IOHEXOL 300 MG/ML  SOLN COMPARISON:  CT abdomen pelvis dated May 26, 2017. FINDINGS: Lower chest: No acute abnormality. Hepatobiliary: There are multiple new small subcentimeter hyperdense lesions scattered along the liver capsular surface. Small gallstone. No gallbladder wall thickening or biliary dilatation. Pancreas: Unremarkable. No pancreatic ductal dilatation or surrounding inflammatory changes. Spleen: Normal in size without focal abnormality. Adrenals/Urinary Tract: Adrenal glands are unremarkable. Very mild right hydroureteronephrosis. Mild fullness of  the left renal collecting system and ureter. No renal or ureteral calculi. The bladder is unremarkable. Stomach/Bowel: Unchanged small hiatal hernia with surgical clips near the gastroesophageal junction. The stomach is otherwise within normal limits. There are several borderline and mildly dilated loops of small bowel with a transition point in the pelvis, where there is a suggestion of a desmoplastic reaction surrounding a 1.3 cm spiculated nodule in the mesentery (series 6, image 50). The appendix is at the upper limits of normal in size with hyperenhancement near its tip and loss of the normal flat plane between it and the adjacent sigmoid colon (series 6, images 59-61). Infraumbilical ventral abdominal wall diastasis containing a small nondilated portion of transverse colon and a small amount of fluid. The colon is otherwise unremarkable. Vascular/Lymphatic: Aortic atherosclerosis. Enlarged hyperenhancing gastrohepatic and portacaval lymph nodes measuring up to 1.1 cm in short axis. Mildly enlarged bilateral inguinal lymph nodes measuring up to 1.3 cm in short axis. Reproductive: Status post hysterectomy. No adnexal masses. Other: Trace ascites in the pelvis.  No pneumoperitoneum. Musculoskeletal: No acute or significant osseous findings. IMPRESSION: 1. High-grade  small bowel obstruction with transition point in the pelvis in an area of suspected desmoplastic reaction surrounding a 1.3 cm spiculated nodule in the mesentery, suspicious for carcinoid. There is hyperenhancement near the tip of the appendix and loss of the normal fat plane between it and the adjacent sigmoid colon, potentially the site of primary lesion. 2. Multiple new small subcentimeter hyperdense lesions scattered along the liver capsule, concerning for metastases. Enlarged hyperenhancing gastrohepatic and portacaval lymph nodes concerning for nodal metastases. 3. Very mild right hydroureteronephrosis to the level of the desmoplastic reaction in the pelvis, potentially involving the right ureter. 4. Infraumbilical ventral abdominal wall diastasis containing a small nondilated portion of transverse colon. 5. Trace ascites. 6. Cholelithiasis. 7.  Aortic atherosclerosis (ICD10-I70.0). Electronically Signed   By: Titus Dubin M.D.   On: 02/27/2019 12:22   Dg Abd Portable 1v  Result Date: 02/28/2019 CLINICAL DATA:  Small bowel obstruction EXAM: PORTABLE ABDOMEN - 1 VIEW COMPARISON:  02/28/2019, 11/04/2016: KUB 02/27/2019: CT abdomen/pelvis FINDINGS: There is oral contrast material which has further transit into the colon to the level of the rectum. There is persistent dilated small bowel loops measuring up to 3.6 cm in diameter. There is no evidence of pneumoperitoneum, portal venous gas or pneumatosis. There are no pathologic calcifications along the expected course of the ureters. The osseous structures are unremarkable. IMPRESSION: Persistent small-bowel obstruction. Oral contrast has traversed into the colon and into the rectum suggesting a partial small bowel obstruction. Electronically Signed   By: Kathreen Devoid   On: 02/28/2019 14:01   Dg Abd Portable 1v-small Bowel Obstruction Protocol-initial, 8 Hr Delay  Result Date: 02/28/2019 CLINICAL DATA:  Small-bowel obstruction.  A are delay film. EXAM:  PORTABLE ABDOMEN - 1 VIEW COMPARISON:  February 27, 2019 CT. FINDINGS: There are persistently dilated loops of small bowel scattered throughout the abdomen. Oral contrast remains in the small bowel and stomach. Oral contrast is seen extending into the ascending colon and transverse colon. Oral contrast is not definitively identified in the descending colon or rectum. Contrast is seen within the urinary bladder. IMPRESSION: 1. Persistent small bowel obstruction. 2. Oral contrast remains within the stomach and small bowel. 3. Small amount of oral contrast is seen within the ascending and transverse colon. 4. No pneumatosis or free air. Electronically Signed   By: Constance Holster M.D.   On: 02/28/2019 01:48  Labs:  CBC: Recent Labs    02/27/19 1012  WBC 9.3  HGB 14.8  HCT 44.0  PLT 229    COAGS: No results for input(s): INR, APTT in the last 8760 hours.  BMP: Recent Labs    02/27/19 1012 02/28/19 0242  NA 138 140  K 4.1 3.9  CL 104 105  CO2 21* 27  GLUCOSE 144* 148*  BUN 14 10  CALCIUM 9.2 8.9  CREATININE 0.84 1.11*  GFRNONAA >60 52*  GFRAA >60 >60    LIVER FUNCTION TESTS: Recent Labs    02/27/19 1012 02/28/19 0242  BILITOT 0.8 0.7  AST 22 19  ALT 20 16  ALKPHOS 76 66  PROT 7.1 6.5  ALBUMIN 4.0 3.4*    TUMOR MARKERS: No results for input(s): AFPTM, CEA, CA199, CHROMGRNA in the last 8760 hours.  Assessment and Plan:  Fallopian tube cancer (Ovarian) First Dx 2018 Now with recurrence-- metastasis to liver Plan for chemo to start Friday Scheduled for [facility] placement now Risks and benefits of image guided port-a-catheter placement was discussed with the patient including, but not limited to bleeding, infection, pneumothorax, or fibrin sheath development and need for additional procedures.  All of the patient's questions were answered, patient is agreeable to proceed. Consent signed and in chart.   Thank you for this interesting consult.  I greatly enjoyed  meeting DYMIN PILGER and look forward to participating in their care.  A copy of this report was sent to the requesting provider on this date.  Electronically Signed: Lavonia Drafts, PA-C 03/16/2019, 12:05 PM   I spent a total of  30 Minutes   in face to face in clinical consultation, greater than 50% of which was counseling/coordinating care for [facility] placement

## 2019-03-17 ENCOUNTER — Other Ambulatory Visit: Payer: Self-pay | Admitting: Hematology and Oncology

## 2019-03-19 ENCOUNTER — Other Ambulatory Visit: Payer: Self-pay

## 2019-03-19 ENCOUNTER — Inpatient Hospital Stay: Payer: BC Managed Care – PPO

## 2019-03-19 VITALS — BP 153/78 | HR 70 | Temp 97.8°F | Resp 18

## 2019-03-19 DIAGNOSIS — Z90710 Acquired absence of both cervix and uterus: Secondary | ICD-10-CM | POA: Diagnosis not present

## 2019-03-19 DIAGNOSIS — R180 Malignant ascites: Secondary | ICD-10-CM | POA: Diagnosis not present

## 2019-03-19 DIAGNOSIS — C569 Malignant neoplasm of unspecified ovary: Secondary | ICD-10-CM | POA: Diagnosis not present

## 2019-03-19 DIAGNOSIS — C786 Secondary malignant neoplasm of retroperitoneum and peritoneum: Secondary | ICD-10-CM | POA: Diagnosis not present

## 2019-03-19 DIAGNOSIS — K449 Diaphragmatic hernia without obstruction or gangrene: Secondary | ICD-10-CM | POA: Diagnosis not present

## 2019-03-19 DIAGNOSIS — I10 Essential (primary) hypertension: Secondary | ICD-10-CM | POA: Diagnosis not present

## 2019-03-19 DIAGNOSIS — K219 Gastro-esophageal reflux disease without esophagitis: Secondary | ICD-10-CM | POA: Diagnosis not present

## 2019-03-19 DIAGNOSIS — Z79899 Other long term (current) drug therapy: Secondary | ICD-10-CM | POA: Diagnosis not present

## 2019-03-19 DIAGNOSIS — C5701 Malignant neoplasm of right fallopian tube: Secondary | ICD-10-CM

## 2019-03-19 DIAGNOSIS — Z9221 Personal history of antineoplastic chemotherapy: Secondary | ICD-10-CM | POA: Diagnosis not present

## 2019-03-19 DIAGNOSIS — Z90722 Acquired absence of ovaries, bilateral: Secondary | ICD-10-CM | POA: Diagnosis not present

## 2019-03-19 MED ORDER — SODIUM CHLORIDE 0.9% FLUSH
10.0000 mL | INTRAVENOUS | Status: DC | PRN
  Administered 2019-03-19: 10 mL
  Filled 2019-03-19: qty 10

## 2019-03-19 MED ORDER — DOXORUBICIN HCL LIPOSOMAL CHEMO INJECTION 2 MG/ML
31.0000 mg/m2 | Freq: Once | INTRAVENOUS | Status: AC
  Administered 2019-03-19: 60 mg via INTRAVENOUS
  Filled 2019-03-19: qty 30

## 2019-03-19 MED ORDER — DIPHENHYDRAMINE HCL 50 MG/ML IJ SOLN
25.0000 mg | Freq: Once | INTRAMUSCULAR | Status: AC
  Administered 2019-03-19: 25 mg via INTRAVENOUS

## 2019-03-19 MED ORDER — DIPHENHYDRAMINE HCL 50 MG/ML IJ SOLN
INTRAMUSCULAR | Status: AC
  Filled 2019-03-19: qty 1

## 2019-03-19 MED ORDER — PALONOSETRON HCL INJECTION 0.25 MG/5ML
0.2500 mg | Freq: Once | INTRAVENOUS | Status: AC
  Administered 2019-03-19: 0.25 mg via INTRAVENOUS

## 2019-03-19 MED ORDER — FAMOTIDINE IN NACL 20-0.9 MG/50ML-% IV SOLN
20.0000 mg | Freq: Once | INTRAVENOUS | Status: AC
  Administered 2019-03-19: 20 mg via INTRAVENOUS

## 2019-03-19 MED ORDER — FAMOTIDINE IN NACL 20-0.9 MG/50ML-% IV SOLN
INTRAVENOUS | Status: AC
  Filled 2019-03-19: qty 50

## 2019-03-19 MED ORDER — SODIUM CHLORIDE 0.9 % IV SOLN
541.0000 mg | Freq: Once | INTRAVENOUS | Status: AC
  Administered 2019-03-19: 540 mg via INTRAVENOUS
  Filled 2019-03-19: qty 54

## 2019-03-19 MED ORDER — PALONOSETRON HCL INJECTION 0.25 MG/5ML
INTRAVENOUS | Status: AC
  Filled 2019-03-19: qty 5

## 2019-03-19 MED ORDER — HEPARIN SOD (PORK) LOCK FLUSH 100 UNIT/ML IV SOLN
500.0000 [IU] | Freq: Once | INTRAVENOUS | Status: AC | PRN
  Administered 2019-03-19: 500 [IU]
  Filled 2019-03-19: qty 5

## 2019-03-19 MED ORDER — DEXTROSE 5 % IV SOLN
Freq: Once | INTRAVENOUS | Status: AC
  Administered 2019-03-19: 09:00:00 via INTRAVENOUS
  Filled 2019-03-19: qty 250

## 2019-03-19 MED ORDER — SODIUM CHLORIDE 0.9 % IV SOLN
Freq: Once | INTRAVENOUS | Status: AC
  Administered 2019-03-19: 10:00:00 via INTRAVENOUS
  Filled 2019-03-19: qty 5

## 2019-03-19 MED ORDER — SODIUM CHLORIDE 0.9 % IV SOLN
Freq: Once | INTRAVENOUS | Status: AC
  Administered 2019-03-19: 12:00:00 via INTRAVENOUS
  Filled 2019-03-19: qty 250

## 2019-03-19 NOTE — Patient Instructions (Signed)
[facility] Discharge Instructions for Patients Receiving Chemotherapy  Today you received the following chemotherapy agents: Carboplatin and Doxil  To help prevent nausea and vomiting after your treatment, we encourage you to take your nausea medication as needed.  Do not take zofran (ondansetron) for three days after your treatment!  If you develop nausea and vomiting that is not controlled by your nausea medication, call the clinic.   BELOW ARE SYMPTOMS THAT SHOULD BE REPORTED IMMEDIATELY:  *FEVER GREATER THAN 100.5 F  *CHILLS WITH OR WITHOUT FEVER  NAUSEA AND VOMITING THAT IS NOT CONTROLLED WITH YOUR NAUSEA MEDICATION  *UNUSUAL SHORTNESS OF BREATH  *UNUSUAL BRUISING OR BLEEDING  TENDERNESS IN MOUTH AND THROAT WITH OR WITHOUT PRESENCE OF ULCERS  *URINARY PROBLEMS  *BOWEL PROBLEMS  UNUSUAL RASH Items with * indicate a potential emergency and should be followed up as soon as possible.  Feel free to call the clinic should you have any questions or concerns. The clinic phone number is [phone].  Please show the [facility] at check-in to the Emergency Department and triage nurse.     Carboplatin injection What is this medicine? CARBOPLATIN (KAR boe pla tin) is a chemotherapy drug. It targets fast dividing cells, like cancer cells, and causes these cells to die. This medicine is used to treat ovarian cancer and many other cancers. This medicine may be used for other purposes; ask your health care provider or pharmacist if you have questions. COMMON BRAND NAME(S): Paraplatin What should I tell my health care provider before I take this medicine? They need to know if you have any of these conditions:  blood disorders  hearing problems  kidney disease  recent or ongoing radiation therapy  an unusual or allergic reaction to carboplatin, cisplatin, other chemotherapy, other medicines, foods, dyes, or preservatives  pregnant or trying to get  pregnant  breast-feeding How should I use this medicine? This drug is usually given as an infusion into a vein. It is administered in a hospital or clinic by a specially trained health care professional. Talk to your pediatrician regarding the use of this medicine in children. Special care may be needed. Overdosage: If you think you have taken too much of this medicine contact a poison control center or emergency room at once. NOTE: This medicine is only for you. Do not share this medicine with others. What if I miss a dose? It is important not to miss a dose. Call your doctor or health care professional if you are unable to keep an appointment. What may interact with this medicine?  medicines for seizures  medicines to increase blood counts like filgrastim, pegfilgrastim, sargramostim  some antibiotics like amikacin, gentamicin, neomycin, streptomycin, tobramycin  vaccines Talk to your doctor or health care professional before taking any of these medicines:  acetaminophen  aspirin  ibuprofen  ketoprofen  naproxen This list may not describe all possible interactions. Give your health care provider a list of all the medicines, herbs, non-prescription drugs, or dietary supplements you use. Also tell them if you smoke, drink alcohol, or use illegal drugs. Some items may interact with your medicine. What should I watch for while using this medicine? Your condition will be monitored carefully while you are receiving this medicine. You will need important blood work done while you are taking this medicine. This drug may make you feel generally unwell. This is not uncommon, as chemotherapy can affect healthy cells as well as cancer cells. Report any side effects. Continue  your course of treatment even though you feel ill unless your doctor tells you to stop. In some cases, you may be given additional medicines to help with side effects. Follow all directions for their use. Call your  doctor or health care professional for advice if you get a fever, chills or sore throat, or other symptoms of a cold or flu. Do not treat yourself. This drug decreases your body's ability to fight infections. Try to avoid being around people who are sick. This medicine may increase your risk to bruise or bleed. Call your doctor or health care professional if you notice any unusual bleeding. Be careful brushing and flossing your teeth or using a toothpick because you may get an infection or bleed more easily. If you have any dental work done, tell your dentist you are receiving this medicine. Avoid taking products that contain aspirin, acetaminophen, ibuprofen, naproxen, or ketoprofen unless instructed by your doctor. These medicines may hide a fever. Do not become pregnant while taking this medicine. Women should inform their doctor if they wish to become pregnant or think they might be pregnant. There is a potential for serious side effects to an unborn child. Talk to your health care professional or pharmacist for more information. Do not breast-feed an infant while taking this medicine. What side effects may I notice from receiving this medicine? Side effects that you should report to your doctor or health care professional as soon as possible:  allergic reactions like skin rash, itching or hives, swelling of the face, lips, or tongue  signs of infection - fever or chills, cough, sore throat, pain or difficulty passing urine  signs of decreased platelets or bleeding - bruising, pinpoint red spots on the skin, black, tarry stools, nosebleeds  signs of decreased red blood cells - unusually weak or tired, fainting spells, lightheadedness  breathing problems  changes in hearing  changes in vision  chest pain  high blood pressure  low blood counts - This drug may decrease the number of white blood cells, red blood cells and platelets. You may be at increased risk for infections and  bleeding.  nausea and vomiting  pain, swelling, redness or irritation at the injection site  pain, tingling, numbness in the hands or feet  problems with balance, talking, walking  trouble passing urine or change in the amount of urine Side effects that usually do not require medical attention (report to your doctor or health care professional if they continue or are bothersome):  hair loss  loss of appetite  metallic taste in the mouth or changes in taste This list may not describe all possible side effects. Call your doctor for medical advice about side effects. You may report side effects to FDA at 1-800-FDA-1088. Where should I keep my medicine? This drug is given in a hospital or clinic and will not be stored at home. NOTE: This sheet is a summary. It may not cover all possible information. If you have questions about this medicine, talk to your doctor, pharmacist, or health care provider.  2020 Elsevier/Gold Standard (2007-07-14 14:38:05)   Doxorubicin Liposomal injection What is this medicine? LIPOSOMAL DOXORUBICIN (LIP oh som al dox oh ROO bi sin) is a chemotherapy drug. This medicine is used to treat many kinds of cancer like Kaposi's sarcoma, multiple myeloma, and ovarian cancer. This medicine may be used for other purposes; ask your health care provider or pharmacist if you have questions. COMMON BRAND NAME(S): Doxil, Lipodox What should I tell my  health care provider before I take this medicine? They need to know if you have any of these conditions:  blood disorders  heart disease  infection (especially a virus infection such as chickenpox, cold sores, or herpes)  liver disease  recent or ongoing radiation therapy  an unusual or allergic reaction to doxorubicin, other chemotherapy agents, soybeans, other medicines, foods, dyes, or preservatives  pregnant or trying to get pregnant  breast-feeding How should I use this medicine? This drug is given as an  infusion into a vein. It is administered in a hospital or clinic by a specially trained health care professional. If you have pain, swelling, burning or any unusual feeling around the site of your injection, tell your health care professional right away. Talk to your pediatrician regarding the use of this medicine in children. Special care may be needed. Overdosage: If you think you have taken too much of this medicine contact a poison control center or emergency room at once. NOTE: This medicine is only for you. Do not share this medicine with others. What if I miss a dose? It is important not to miss your dose. Call your doctor or health care professional if you are unable to keep an appointment. What may interact with this medicine? Do not take this medicine with any of the following medications:  zidovudine This medicine may also interact with the following medications:  medicines to increase blood counts like filgrastim, pegfilgrastim, sargramostim  vaccines Talk to your doctor or health care professional before taking any of these medicines:  acetaminophen  aspirin  ibuprofen  ketoprofen  naproxen This list may not describe all possible interactions. Give your health care provider a list of all the medicines, herbs, non-prescription drugs, or dietary supplements you use. Also tell them if you smoke, drink alcohol, or use illegal drugs. Some items may interact with your medicine. What should I watch for while using this medicine? Your condition will be monitored carefully while you are receiving this medicine. You may need blood work done while you are taking this medicine. This drug may make you feel generally unwell. This is not uncommon, as chemotherapy can affect healthy cells as well as cancer cells. Report any side effects. Continue your course of treatment even though you feel ill unless your doctor tells you to stop. Your urine may turn orange-red for a few days after your  dose. This is not blood. If your urine is dark or brown, call your doctor. In some cases, you may be given additional medicines to help with side effects. Follow all directions for their use. Talk to your doctor about your risk of cancer. You may be more at risk for certain types of cancers if you take this medicine. Do not become pregnant while taking this medicine or for 6 months after stopping it. Women should inform their healthcare professional if they wish to become pregnant or think they may be pregnant. Men should not father a child while taking this medicine and for 6 months after stopping it. There is a potential for serious side effects to an unborn child. Talk to your health care professional or pharmacist for more information. Do not breast-feed an infant while taking this medicine. This medicine has caused ovarian failure in some women. This medicine may make it more difficult to get pregnant. Talk to your healthcare professional if you are concerned about your fertility. This medicine has caused decreased sperm counts in some men. This may make it more difficult to  father a child. Talk to your healthcare professional if you are concerned about your fertility. This medicine may cause a decrease in Co-Enzyme Q-10. You should make sure that you get enough Co-Enzyme Q-10 while you are taking this medicine. Discuss the foods you eat and the vitamins you take with your health care professional. What side effects may I notice from receiving this medicine? Side effects that you should report to your doctor or health care professional as soon as possible:  allergic reactions like skin rash, itching or hives, swelling of the face, lips, or tongue  low blood counts - this medicine may decrease the number of white blood cells, red blood cells and platelets. You may be at increased risk for infections and bleeding.  signs of hand-foot syndrome - tingling or burning, redness, flaking, swelling, small  blisters, or small sores on the palms of your hands or the soles of your feet  signs of infection - fever or chills, cough, sore throat, pain or difficulty passing urine  signs of decreased platelets or bleeding - bruising, pinpoint red spots on the skin, black, tarry stools, blood in the urine  signs of decreased red blood cells - unusually weak or tired, fainting spells, lightheadedness  back pain, chills, facial flushing, fever, headache, tightness in the chest or throat during the infusion  breathing problems  chest pain  fast, irregular heartbeat  mouth pain, redness, sores  pain, swelling, redness at site where injected  pain, tingling, numbness in the hands or feet  swelling of ankles, feet, or hands  vomiting Side effects that usually do not require medical attention (report to your doctor or health care professional if they continue or are bothersome):  diarrhea  hair loss  loss of appetite  nail discoloration or damage  nausea  red or watery eyes  red colored urine  stomach upset This list may not describe all possible side effects. Call your doctor for medical advice about side effects. You may report side effects to FDA at 1-800-FDA-1088. Where should I keep my medicine? This drug is given in a hospital or clinic and will not be stored at home. NOTE: This sheet is a summary. It may not cover all possible information. If you have questions about this medicine, talk to your doctor, pharmacist, or health care provider.  2020 Elsevier/Gold Standard (2017-12-15 15:13:26)

## 2019-03-19 NOTE — Progress Notes (Signed)
03/19/19  Carboplatin dose for today is dose # 7 will need premedications of:  Famotidine 20 mg IVPB and diphenhydramine 25 mg IV (patient reports getting jittery with diphenhydramine).  Orders updated to reflect above.  Henreitta Leber, PharmD

## 2019-03-22 ENCOUNTER — Telehealth: Payer: Self-pay

## 2019-03-22 NOTE — Telephone Encounter (Signed)
She called and left a message to call her.  Called back. She is doing well except for being a little tired. Instructed to call the office if needed. She verbalized understanding.

## 2019-03-22 NOTE — Telephone Encounter (Signed)
Called and left a message asking her to call the office if needed. This is a telephone call just to see how she is doing after her treatment.

## 2019-03-22 NOTE — Telephone Encounter (Signed)
-----   Message from Arman Bogus, RN sent at 03/19/2019 12:42 PM EST ----- Regarding: Alvy Bimler 1st time Carbo/Doxil First time Carbo/Doxil Tolerated well

## 2019-03-23 ENCOUNTER — Telehealth: Payer: Self-pay

## 2019-03-23 NOTE — Telephone Encounter (Signed)
Faxed signed prognosis form to [facility] as requested for [employer] claim. Sent a copy to be scanned into patient's EMR.

## 2019-04-06 ENCOUNTER — Telehealth: Payer: Self-pay

## 2019-04-06 NOTE — Telephone Encounter (Signed)
1) Stop coQ10 2) No supplements or probiotics while on chemo 3) agree with cortisone cream; if not helpful I can see her Thursday morning

## 2019-04-06 NOTE — Telephone Encounter (Signed)
She called and left a message to call her.  Called back. She has had a rash to arms, abdomen and legs for 1 week. She has scratched so much that she has open areas now. She is only applying lotion to the areas. She will start applying cortisone cream today. She will send a picture of the rash on mychart. On the last treatment the nurse recommended that she start CoQ10. Does Dr. Alvy Bimler recommend the CoQ10? What about probiotics?

## 2019-04-06 NOTE — Telephone Encounter (Signed)
Called and given below message. She verbalized understanding. She never started the CoQ10. She will call back if appt needed after trying the cortisone cream. She is having trouble sending the picture on mychart.

## 2019-04-08 ENCOUNTER — Telehealth: Payer: Self-pay

## 2019-04-08 ENCOUNTER — Other Ambulatory Visit: Payer: Self-pay

## 2019-04-08 ENCOUNTER — Inpatient Hospital Stay: Payer: BC Managed Care – PPO | Attending: Hematology and Oncology | Admitting: Hematology and Oncology

## 2019-04-08 ENCOUNTER — Encounter: Payer: Self-pay | Admitting: Hematology and Oncology

## 2019-04-08 DIAGNOSIS — C5701 Malignant neoplasm of right fallopian tube: Secondary | ICD-10-CM

## 2019-04-08 DIAGNOSIS — L270 Generalized skin eruption due to drugs and medicaments taken internally: Secondary | ICD-10-CM

## 2019-04-08 DIAGNOSIS — Z5111 Encounter for antineoplastic chemotherapy: Secondary | ICD-10-CM | POA: Diagnosis not present

## 2019-04-08 MED ORDER — PREDNISONE 20 MG PO TABS: 20.0000 mg | ORAL_TABLET | Freq: Every day | ORAL | 0 refills | Status: DC

## 2019-04-08 NOTE — Telephone Encounter (Signed)
-----   Message from Heath Lark, MD sent at 04/08/2019  8:09 AM EST ----- Regarding: rash Can you call and ask if her rash is better? Does she need to be seen today?

## 2019-04-08 NOTE — Telephone Encounter (Signed)
Pls do

## 2019-04-08 NOTE — Telephone Encounter (Signed)
Called and given below message. She verbalized understanding. The rash may be a little better. She tookl some benadryl for the itching and she is using the cortisone cream. She would like appt and scheduled for 1015 today.

## 2019-04-08 NOTE — Assessment & Plan Note (Signed)
The cause of the skin rash is unknown With the patient's permission, I have taken pictures It is not really resolving with conservative approach I recommend low-dose prednisone therapy for 10 days along with Benadryl as needed for itching We will observe closely I will reexamine her again before her treatment on December 28 She agreed to try

## 2019-04-08 NOTE — Assessment & Plan Note (Signed)
She tolerated recent chemotherapy well without major side effects She denies abdominal pain We will continue treatment as scheduled

## 2019-04-08 NOTE — Progress Notes (Signed)
[facility] OFFICE PROGRESS NOTE  Patient Care Team: Gaynelle Arabian, MD as PCP - General (Family Medicine) Heath Lark, MD as Consulting Physician (Hematology and Oncology) Everitt Amber, MD as Consulting Physician (Obstetrics and Gynecology)  ASSESSMENT & PLAN:  Adenocarcinoma of right fallopian tube [facility]) She tolerated recent chemotherapy well without major side effects She denies abdominal pain We will continue treatment as scheduled  Drug-induced skin rash The cause of the skin rash is unknown With the patient's permission, I have taken pictures It is not really resolving with conservative approach I recommend low-dose prednisone therapy for 10 days along with Benadryl as needed for itching We will observe closely I will reexamine her again before her treatment on December 28 She agreed to try   No orders of the defined types were placed in this encounter.   INTERVAL HISTORY: Please see below for problem oriented charting. She is seen urgently due to unresolving rash Her rash started approximately 10 days ago It is distributed in her back, her torso, or the back of her hand and both shin It is itchy in nature She denies new soap products, medications or exposure to new environment She has tried some Benadryl and cream and those are not helping She received chemotherapy soon after Thanksgiving and denies side effects from it No nausea She denies abdominal pain or changes in bowel habits after chemotherapy  SUMMARY OF ONCOLOGIC HISTORY: Oncology History Overview Note  Neg genetics   Adenocarcinoma of right fallopian tube ([place])  08/12/2016 Initial Diagnosis   The patient has many months of vague symptoms of fullness in the pelvis, urinary frequency and difficulty with defecation. She denies vaginal bleeding or rectal bleeding.    08/12/2016 Imaging   Abdomen X-ray in the ER: Moderate colonic stool burden without evidence of enteric obstruction     11/13/2016 Imaging   TVUS was performed on 11/13/16 which showed a large hypoechoic lobular mass seen posterior to the uterus measuring 18.2x16.1x11.8cm, blood flow was seen along the periphery of the mass. It was considered to be likely to be a fibroid vs pelvic mass vs ovarian mass. Neither ovary was removed. Moderate amount of free fluid in the pelvis. THe uterus measures 4x10x4cm. The endometrium is 37m.    12/05/2016 Imaging   MR pelvis 1. Mild limitations as detailed above. 2. Heterogeneous pelvic mass is favored to arise from the right ovary. Favor a solid ovarian neoplasm such as fibroma/Brenner's tumor. Given lesion size and heterogeneous T2 signal out, complicating torsion cannot be excluded. 3. Moderate abdominopelvic ascites, without specific evidence of peritoneal metastasis. Consider further evaluation with contrast-enhanced abdominopelvic CT.    12/26/2016 Pathology Results   1. Uterus, ovaries and fallopian tubes - ADENOCARCINOMA INVOLVING RIGHT FALLOPIAN TUBE, RIGHT AND LEFT OVARIES, UTERINE SEROSA AND PELVIC MASS. - SEE ONCOLOGY TABLE AND COMMENT. - UTERINE CERVIX, ENDOMETRIUM, MYOMETRIUM AND LEFT FALLOPIAN TUBE FREE OF TUMOR. 2. Omentum, resection for tumor - ADENOCARCINOMA. Microscopic Comment 1. ONCOLOGY TABLE - FALLOPIAN TUBE. SEE COMMENT 1. Specimen, including laterality: Uterus, bilateral adnexa, pelvic mass and omentum. 2. Procedure: Hysterectomy with bilateral salpingo-oophorectomy, pelvic mass excision and omentectomy. 3. Lymph node sampling performed: No 4. Tumor site: See comment. 5. Tumor location in fallopian tube: Right fallopian tube fimbria 6. Specimen integrity (intact/ruptured/disrupted): Intact 7. Tumor size (cm): 18 cm, see comment. 8. Histologic type: Adenocarcinoma 9. Grade: High grade 10. Microscopic tumor extension: Tumor involves right fallopian tube, right and left ovaries, uterine serosa, pelvic mass and omentum. 11. Margins:  See comment. 12.  Lymph-Vascular invasion: Present 13. Lymph nodes: # examined: 0; # positive: N/A 14. TNM: pT3c, pNX 15. FIGO Stage (based on pathologic findings, needs clinical correlation: III-C 16. Comments: There is an 18 cm pelvic mass which is a high grade adenocarcinoma and there is a 12.2 cm  segment of omentum which is extensively involved with adenocarcinoma. The tumor also involves the fimbria of the right fallopian tube and the parenchyma of the right and left ovaries as well as uterine serosa. The fimbria of the right fallopian has intraepithelial atypia consistent with a precursor lesion and therefore this is most consistent with primary fallopian tube adenocarcinoma. A primary peritoneal serous carcinoma is a less likely possibility.    12/26/2016 Pathology Results   PERITONEAL/ASCITIC FLUID(SPECIMEN 1 OF 1 COLLECTED 12/26/16): POORLY DIFFERENTIATED ADENOCARCINOMA   12/26/2016 Surgery   Procedure(s) Performed: Exploratory laparotomy with total abdominal hysterectomy, bilateral salpingo-oophorectomy, omentectomy radical tumor debulking for ovarian cancer .  Surgeon: Thereasa Solo, MD.   Operative Findings: 20cm left ovarian mass densely adherent to the posterior uterus, right tube and ovary, cervix, sigmoid colon. 10cm omental cake. 4L ascites. 454m size tumor nodules on the serosa of the terminal ileum and proximal sigmoid colon. 1154mnodules on right diaphragm.    This represented an optimal cytoreduction (R1) with 54m30modules on intestine and diaphragm representing gross visible disease   01/16/2017 Procedure   Placement of single lumen port a cath via right internal jugular vein. The catheter tip lies at the cavo-atrial junction. A power injectable port a cath was placed and is ready for immediate use.   01/17/2017 Imaging   1. 3.5 x 7.2 x 4.6 cm fluid collection along the vaginal cuff, posterior the bladder and extending into the right adnexal space. This lesion demonstrates rim enhancement. Imaging  features could be related to a loculated postoperative seroma or hematoma. Superinfection cannot be excluded by CT. Given debulking surgery was 3 weeks ago, this entire structure is un likely to represent neoplasm, but peritoneal involvement could have this appearance. 2. Small fluid collection in the left para colic gutter without rim enhancement. 3. Irregular/nodular appearance of the peritoneal M in the anatomic pelvis with areas of subtle nodularity between the stomach and the spleen. Close attention in these regions on follow-up recommended as metastatic disease is a concern. 4. Mildly enlarged hepatoduodenal ligament lymph node. Attention on follow-up recommended.   01/20/2017 Tumor Marker   Patient's tumor was tested for the following markers: CA-125 Results of the tumor marker test revealed 46.6   01/28/2017 Genetic Testing       Negative genetic testing on the [employer].  The [facility] panel offered by [employer] sequencing and deletion/duplication testing of the following 28 genes: APC, ATM, BARD1, BMPR1A, BRCA1, BRCA2, BRIP1, CHD1, CDK4, CDKN2A, CHEK2, EPCAM (large rearrangement only), MLH1, MSH2, MSH6, MUTYH, NBN, PALB2, PMS2, PTEN, RAD51C, RAD51D, SMAD4, STK11, and TP53. Sequencing was performed for select regions of POLE and POLD1, and large rearrangement analysis was performed for select regions of GREM1. The report date is January 28, 2017.  HRD testing looking for genomic instability and BRCA mutations was negative.  The report date of this test is January 27, 2017.    01/31/2017 Tumor Marker   Patient's tumor was tested for the following markers: CA-125 Results of the tumor marker test revealed 22.4   03/04/2017 Tumor Marker   Patient's tumor was tested for the following markers: CA-125 Results of the tumor  marker test revealed 13.8   04/18/2017 Tumor Marker   Patient's tumor was tested for the following markers: CA-125 Results of the  tumor marker test revealed 11.9   05/05/2017 Tumor Marker   Patient's tumor was tested for the following markers: CA-125 Results of the tumor marker test revealed 12   05/26/2017 Tumor Marker   Patient's tumor was tested for the following markers: CA-125 Results of the tumor marker test revealed 10.6   05/26/2017 Imaging   1. A previously noted postoperative fluid collection in the low pelvis and residual ascites seen on the prior examination has resolved on today's study. No findings to suggest residual/recurrent disease on today's examination. No definite solid organ metastasis identified in the abdomen or pelvis. No lymphadenopathy. 2. Aortic atherosclerosis.   06/10/2017 Procedure   Successful right IJ vein Port-A-Cath explant.   03/09/2018 Tumor Marker   Patient's tumor was tested for the following markers: CA-125 Results of the tumor marker test revealed 12.1   05/26/2018 Tumor Marker   Patient's tumor was tested for the following markers: CA-125 Results of the tumor marker test revealed 13.8   09/09/2018 Tumor Marker   Patient's tumor was tested for the following markers: CA-125 Results of the tumor marker test revealed 23.9   12/18/2018 Tumor Marker   Patient's tumor was tested for the following markers: CA-125 Results of the tumor marker test revealed 43.8   02/27/2019 - 02/28/2019 Hospital Admission   She was admitted for bowel obstruction   02/27/2019 Imaging   1. High-grade small bowel obstruction with transition point in the pelvis in an area of suspected desmoplastic reaction surrounding a 1.3 cm spiculated nodule in the mesentery, suspicious for carcinoid. There is hyperenhancement near the tip of the appendix and loss of the normal fat plane between it and the adjacent sigmoid colon, potentially the site of primary lesion. 2. Multiple new small subcentimeter hyperdense lesions scattered along the liver capsule, concerning for metastases. Enlarged hyperenhancing gastrohepatic  and portacaval lymph nodes concerning for nodal metastases. 3. Very mild right hydroureteronephrosis to the level of the desmoplastic reaction in the pelvis, potentially involving the right ureter. 4. Infraumbilical ventral abdominal wall diastasis containing a small nondilated portion of transverse colon. 5. Trace ascites. 6. Cholelithiasis. 7.  Aortic atherosclerosis (ICD10-I70.0).     03/05/2019 Tumor Marker   Patient's tumor was tested for the following markers: CA-125 Results of the tumor marker test revealed 70.1   03/12/2019 Echocardiogram   IMPRESSIONS     1. Left ventricular ejection fraction, by visual estimation, is 60 to 65%. The left ventricle has normal function. There is no left ventricular hypertrophy.  2. Left ventricular diastolic parameters are consistent with Grade I diastolic dysfunction (impaired relaxation).  3. Global right ventricle has normal systolic function.The right ventricular size is normal. No increase in right ventricular wall thickness.  4. Left atrial size was normal.  5. Right atrial size was normal.  6. The pericardium was not assessed.  7. The mitral valve is normal in structure. No evidence of mitral valve regurgitation.  8. The tricuspid valve is normal in structure. Tricuspid valve regurgitation is trivial.  9. The aortic valve is normal in structure. Aortic valve regurgitation is not visualized. 10. The pulmonic valve was grossly normal. Pulmonic valve regurgitation is not visualized. 11. The average left ventricular global longitudinal strain is -17.6 %.   03/16/2019 Procedure   Successful placement of a right internal jugular approach power injectable Port-A-Cath. The catheter is ready for immediate  use.       REVIEW OF SYSTEMS:   Constitutional: Denies fevers, chills or abnormal weight loss Eyes: Denies blurriness of vision Ears, nose, mouth, throat, and face: Denies mucositis or sore throat Respiratory: Denies cough, dyspnea or  wheezes Cardiovascular: Denies palpitation, chest discomfort or lower extremity swelling Gastrointestinal:  Denies nausea, heartburn or change in bowel habits Skin: Denies abnormal skin rashes Lymphatics: Denies new lymphadenopathy or easy bruising Neurological:Denies numbness, tingling or new weaknesses Behavioral/Psych: Mood is stable, no new changes  All other systems were reviewed with the patient and are negative.  I have reviewed the past medical history, past surgical history, social history and family history with the patient and they are unchanged from previous note.  ALLERGIES:  is allergic to dilaudid [hydromorphone hcl]; morphine and related; and sudafed [pseudoephedrine hcl].  MEDICATIONS:  Current Outpatient Medications  Medication Sig Dispense Refill  . acetaminophen (TYLENOL) 325 MG tablet Take 975 mg by mouth every 6 (six) hours as needed (pain).    Marland Kitchen lidocaine-prilocaine (EMLA) cream APP EXT AA 1 TIME    . ondansetron (ZOFRAN) 8 MG tablet Take 1 tablet (8 mg total) by mouth every 8 (eight) hours as needed. 30 tablet 1  . predniSONE (DELTASONE) 20 MG tablet Take 1 tablet (20 mg total) by mouth daily with breakfast. 10 tablet 0  . prochlorperazine (COMPAZINE) 10 MG tablet Take 1 tablet (10 mg total) by mouth every 6 (six) hours as needed (Nausea or vomiting). 30 tablet 1  . venlafaxine XR (EFFEXOR-XR) 37.5 MG 24 hr capsule TAKE 1 CAPSULE BY MOUTH DAILY WITH BREAKFAST (Patient taking differently: Take 37.5 mg by mouth daily with breakfast. ) 90 capsule 6   No current facility-administered medications for this visit.   Facility-Administered Medications Ordered in Other Visits  Medication Dose Route Frequency Provider Last Rate Last Admin  . sodium chloride flush (NS) 0.9 % injection 10 mL  10 mL Intravenous PRN Alvy Bimler, Bekim Werntz, MD   10 mL at 02/11/17 0839  . sodium chloride flush (NS) 0.9 % injection 10 mL  10 mL Intravenous PRN Alvy Bimler, Peyson Postema, MD   10 mL at 03/04/17 1510     PHYSICAL EXAMINATION: ECOG PERFORMANCE STATUS: 1 - Symptomatic but completely ambulatory  Vitals:   04/08/19 1013  BP: 140/73  Pulse: 81  Resp: 18  Temp: 98.5 F (36.9 C)  SpO2: 100%   Filed Weights   04/08/19 1013  Weight: 176 lb 3.2 oz (79.9 kg)    GENERAL:alert, no distress and comfortable SKIN: Her skin rash resembles hives NEURO: alert & oriented x 3 with fluent speech, no focal motor/sensory deficits  LABORATORY DATA:  I have reviewed the data as listed    Component Value Date/Time   NA 140 03/16/2019 1215   NA 139 04/14/2017 0742   K 3.6 03/16/2019 1215   K 4.0 04/14/2017 0742   CL 103 03/16/2019 1215   CO2 27 03/16/2019 1215   CO2 21 (L) 04/14/2017 0742   GLUCOSE 98 03/16/2019 1215   GLUCOSE 247 (H) 04/14/2017 0742   BUN 8 03/16/2019 1215   BUN 13.1 04/14/2017 0742   CREATININE 0.89 03/16/2019 1215   CREATININE 0.9 04/14/2017 0742   CALCIUM 9.3 03/16/2019 1215   CALCIUM 9.2 04/14/2017 0742   PROT 7.1 03/16/2019 1215   PROT 7.3 04/14/2017 0742   ALBUMIN 4.1 03/16/2019 1215   ALBUMIN 3.9 04/14/2017 0742   AST 21 03/16/2019 1215   AST 17 04/14/2017 0742   ALT 19  03/16/2019 1215   ALT 14 04/14/2017 0742   ALKPHOS 69 03/16/2019 1215   ALKPHOS 70 04/14/2017 0742   BILITOT 1.0 03/16/2019 1215   BILITOT 0.37 04/14/2017 0742   GFRNONAA >60 03/16/2019 1215   GFRAA >60 03/16/2019 1215    No results found for: SPEP, UPEP  Lab Results  Component Value Date   WBC 6.0 03/16/2019   NEUTROABS 7.4 02/27/2019   HGB 14.3 03/16/2019   HCT 44.3 03/16/2019   MCV 96.5 03/16/2019   PLT 258 03/16/2019      Chemistry      Component Value Date/Time   NA 140 03/16/2019 1215   NA 139 04/14/2017 0742   K 3.6 03/16/2019 1215   K 4.0 04/14/2017 0742   CL 103 03/16/2019 1215   CO2 27 03/16/2019 1215   CO2 21 (L) 04/14/2017 0742   BUN 8 03/16/2019 1215   BUN 13.1 04/14/2017 0742   CREATININE 0.89 03/16/2019 1215   CREATININE 0.9 04/14/2017 0742       Component Value Date/Time   CALCIUM 9.3 03/16/2019 1215   CALCIUM 9.2 04/14/2017 0742   ALKPHOS 69 03/16/2019 1215   ALKPHOS 70 04/14/2017 0742   AST 21 03/16/2019 1215   AST 17 04/14/2017 0742   ALT 19 03/16/2019 1215   ALT 14 04/14/2017 0742   BILITOT 1.0 03/16/2019 1215   BILITOT 0.37 04/14/2017 0742         RADIOGRAPHIC STUDIES: I have personally reviewed the radiological images as listed and agreed with the findings in the report. ECHOCARDIOGRAM COMPLETE  Result Date: 03/12/2019   ECHOCARDIOGRAM REPORT   Patient Name:   GAL SMOLINSKI Date of Exam: 03/12/2019 Medical Rec #:  [id number]     Height:       65.0 in Accession #:    [id number]    Weight:       181.8 lb Date of Birth:  Jun 14, 1955     BSA:          1.90 m Patient Age:    63 years      BP:           137/77 mmHg Patient Gender: F             HR:           64 bpm. Exam Location:  Outpatient Procedure: 2D Echo, Cardiac Doppler, Color Doppler and Strain Analysis Indications:    Z51.11 Encounter for antineoplastic chemotheraphy  History:        Patient has no prior history of Echocardiogram examinations.                 Risk Factors:Hypertension and GERD. Ovarian Cancer.  Sonographer:    Tiffany Dance Referring Phys: [id number] Shirlene Andaya Kent  1. Left ventricular ejection fraction, by visual estimation, is 60 to 65%. The left ventricle has normal function. There is no left ventricular hypertrophy.  2. Left ventricular diastolic parameters are consistent with Grade I diastolic dysfunction (impaired relaxation).  3. Global right ventricle has normal systolic function.The right ventricular size is normal. No increase in right ventricular wall thickness.  4. Left atrial size was normal.  5. Right atrial size was normal.  6. The pericardium was not assessed.  7. The mitral valve is normal in structure. No evidence of mitral valve regurgitation.  8. The tricuspid valve is normal in structure. Tricuspid valve regurgitation is trivial.  9.  The aortic valve is normal in structure. Aortic valve regurgitation is  not visualized. 10. The pulmonic valve was grossly normal. Pulmonic valve regurgitation is not visualized. 11. The average left ventricular global longitudinal strain is -17.6 %. FINDINGS  Left Ventricle: Left ventricular ejection fraction, by visual estimation, is 60 to 65%. The left ventricle has normal function. The average left ventricular global longitudinal strain is -17.6 %. There is no left ventricular hypertrophy. Left ventricular diastolic parameters are consistent with Grade I diastolic dysfunction (impaired relaxation). Right Ventricle: The right ventricular size is normal. No increase in right ventricular wall thickness. Global RV systolic function is has normal systolic function. Left Atrium: Left atrial size was normal in size. Right Atrium: Right atrial size was normal in size Pericardium: The pericardium was not assessed. Mitral Valve: The mitral valve is normal in structure. No evidence of mitral valve regurgitation. Tricuspid Valve: The tricuspid valve is normal in structure. Tricuspid valve regurgitation is trivial. Aortic Valve: The aortic valve is normal in structure. Aortic valve regurgitation is not visualized. Pulmonic Valve: The pulmonic valve was grossly normal. Pulmonic valve regurgitation is not visualized. Aorta: The aortic root and ascending aorta are structurally normal, with no evidence of dilitation. IAS/Shunts: No atrial level shunt detected by color flow Doppler.  LEFT VENTRICLE PLAX 2D LVIDd:         4.10 cm  Diastology LVIDs:         3.10 cm  LV e' lateral:   10.40 cm/s LV PW:         0.90 cm  LV E/e' lateral: 8.2 LV IVS:        0.90 cm  LV e' medial:    7.29 cm/s LVOT diam:     1.90 cm  LV E/e' medial:  11.7 LV SV:         36 ml LV SV Index:   18.42    2D Longitudinal Strain LVOT Area:     2.84 cm 2D Strain GLS Avg:     -17.6 %  RIGHT VENTRICLE             IVC RV Basal diam:  1.90 cm     IVC diam: 1.50 cm  RV S prime:     11.50 cm/s TAPSE (M-mode): 2.1 cm LEFT ATRIUM             Index       RIGHT ATRIUM           Index LA diam:        3.60 cm 1.90 cm/m  RA Area:     10.60 cm LA Vol (A2C):   36.8 ml 19.37 ml/m RA Volume:   20.70 ml  10.90 ml/m LA Vol (A4C):   26.4 ml 13.90 ml/m LA Biplane Vol: 30.9 ml 16.27 ml/m  AORTIC VALVE LVOT Vmax:   109.00 cm/s LVOT Vmean:  69.700 cm/s LVOT VTI:    0.222 m  AORTA Ao Root diam: 3.00 cm Ao Asc diam:  2.40 cm MITRAL VALVE MV Area (PHT): 3.42 cm             SHUNTS MV PHT:        64.38 msec           Systemic VTI:  0.22 m MV Decel Time: 222 msec             Systemic Diam: 1.90 cm MV E velocity: 85.20 cm/s 103 cm/s MV A velocity: 91.30 cm/s 70.3 cm/s MV E/A ratio:  0.93       1.5  Quillian Quince  Bensimhon MD Electronically signed by Glori Bickers MD Signature Date/Time: 03/12/2019/12:08:46 PM    Final    IR IMAGING GUIDED PORT INSERTION  Result Date: 03/16/2019 INDICATION: Adenocarcinoma of the right fallopian tube. In need of durable intravenous access for chemotherapy administration. Note, patient with history of previous Port a catheter placement by Dr. Kathlene Cote on 01/16/2017. EXAM: IMPLANTED PORT A CATH PLACEMENT WITH ULTRASOUND AND FLUOROSCOPIC GUIDANCE COMPARISON:  Chest CT-01/17/2017 MEDICATIONS: Ancef 2 gm IV; The antibiotic was administered within an appropriate time interval prior to skin puncture. ANESTHESIA/SEDATION: Moderate (conscious) sedation was employed during this procedure. A total of Versed 2 mg and Fentanyl 100 mcg was administered intravenously. Moderate Sedation Time: 28 minutes. The patient's level of consciousness and vital signs were monitored continuously by radiology nursing throughout the procedure under my direct supervision. CONTRAST:  None FLUOROSCOPY TIME:  42 seconds (5 mGy) COMPLICATIONS: None immediate. PROCEDURE: The procedure, risks, benefits, and alternatives were explained to the patient. Questions regarding the procedure were encouraged  and answered. The patient understands and consents to the procedure. The right neck and chest were prepped with chlorhexidine in a sterile fashion, and a sterile drape was applied covering the operative field. Maximum barrier sterile technique with sterile gowns and gloves were used for the procedure. A timeout was performed prior to the initiation of the procedure. Local anesthesia was provided with 1% lidocaine with epinephrine. After creating a small venotomy incision, a micropuncture kit was utilized to access the internal jugular vein. Real-time ultrasound guidance was utilized for vascular access including the acquisition of a permanent ultrasound image documenting patency of the accessed vessel. The microwire was utilized to measure appropriate catheter length. A subcutaneous port pocket was then created along the upper chest wall utilizing a combination of sharp and blunt dissection. The pocket was irrigated with sterile saline. A single lumen ISP power injectable port was chosen for placement. The 8 Fr catheter was tunneled from the port pocket site to the venotomy incision. The port was placed in the pocket. The external catheter was trimmed to appropriate length. At the venotomy, an 8 Fr peel-away sheath was placed over a guidewire under fluoroscopic guidance. The catheter was then placed through the sheath and the sheath was removed. Final catheter positioning was confirmed and documented with a fluoroscopic spot radiograph. The port was accessed with a Huber needle, aspirated and flushed with heparinized saline. The venotomy site was closed with an interrupted 4-0 Vicryl suture. The port pocket incision was closed with interrupted 2-0 Vicryl suture and the skin was opposed with a running subcuticular 4-0 Vicryl suture. Dermabond and Steri-strips were applied to both incisions. Dressings were placed. The patient tolerated the procedure well without immediate post procedural complication. FINDINGS: After  catheter placement, the tip lies within the superior cavoatrial junction. The catheter aspirates and flushes normally and is ready for immediate use. IMPRESSION: Successful placement of a right internal jugular approach power injectable Port-A-Cath. The catheter is ready for immediate use. Electronically Signed   By: Sandi Mariscal M.D.   On: 03/16/2019 15:09    All questions were answered. The patient knows to call the clinic with any problems, questions or concerns. No barriers to learning was detected.  I spent 15 minutes counseling the patient face to face. The total time spent in the appointment was 20 minutes and more than 50% was on counseling and review of test results  Heath Lark, MD 04/08/2019 11:24 AM

## 2019-04-09 ENCOUNTER — Telehealth: Payer: Self-pay

## 2019-04-09 NOTE — Telephone Encounter (Signed)
She called and left a message to call her.  Called back. She has disability paper work that she is going to drop off. Told it can take 7-10 business days to fill out paper work. She verbalized understanding.

## 2019-04-12 ENCOUNTER — Telehealth: Payer: Self-pay

## 2019-04-12 NOTE — Telephone Encounter (Signed)
Called and left a message asking to call the office. I am just calling to check on her rash.

## 2019-04-12 NOTE — Telephone Encounter (Signed)
She called back. The rash is much better. Still having a little itching and taking a 1/2 benadryl tablet. Instructed to call the office if needed. She verbalized understanding.

## 2019-04-19 ENCOUNTER — Encounter: Payer: Self-pay | Admitting: Hematology and Oncology

## 2019-04-19 ENCOUNTER — Inpatient Hospital Stay (HOSPITAL_BASED_OUTPATIENT_CLINIC_OR_DEPARTMENT_OTHER): Payer: BC Managed Care – PPO | Admitting: Hematology and Oncology

## 2019-04-19 ENCOUNTER — Inpatient Hospital Stay: Payer: BC Managed Care – PPO

## 2019-04-19 ENCOUNTER — Other Ambulatory Visit: Payer: Self-pay

## 2019-04-19 ENCOUNTER — Telehealth: Payer: Self-pay | Admitting: Hematology and Oncology

## 2019-04-19 DIAGNOSIS — I10 Essential (primary) hypertension: Secondary | ICD-10-CM | POA: Diagnosis not present

## 2019-04-19 DIAGNOSIS — Z7189 Other specified counseling: Secondary | ICD-10-CM

## 2019-04-19 DIAGNOSIS — C5701 Malignant neoplasm of right fallopian tube: Secondary | ICD-10-CM | POA: Diagnosis not present

## 2019-04-19 DIAGNOSIS — L270 Generalized skin eruption due to drugs and medicaments taken internally: Secondary | ICD-10-CM | POA: Diagnosis not present

## 2019-04-19 DIAGNOSIS — Z5111 Encounter for antineoplastic chemotherapy: Secondary | ICD-10-CM | POA: Diagnosis not present

## 2019-04-19 LAB — CBC WITH DIFFERENTIAL (CANCER CENTER ONLY)
Abs Immature Granulocytes: 0.06 10*3/uL (ref 0.00–0.07)
Basophils Absolute: 0 10*3/uL (ref 0.0–0.1)
Basophils Relative: 1 %
Eosinophils Absolute: 0 10*3/uL (ref 0.0–0.5)
Eosinophils Relative: 0 %
HCT: 37.9 % (ref 36.0–46.0)
Hemoglobin: 12.4 g/dL (ref 12.0–15.0)
Immature Granulocytes: 1 %
Lymphocytes Relative: 34 %
Lymphs Abs: 1.7 10*3/uL (ref 0.7–4.0)
MCH: 31 pg (ref 26.0–34.0)
MCHC: 32.7 g/dL (ref 30.0–36.0)
MCV: 94.8 fL (ref 80.0–100.0)
Monocytes Absolute: 0.6 10*3/uL (ref 0.1–1.0)
Monocytes Relative: 12 %
Neutro Abs: 2.6 10*3/uL (ref 1.7–7.7)
Neutrophils Relative %: 52 %
Platelet Count: 210 10*3/uL (ref 150–400)
RBC: 4 MIL/uL (ref 3.87–5.11)
RDW: 14 % (ref 11.5–15.5)
WBC Count: 4.9 10*3/uL (ref 4.0–10.5)
nRBC: 0 % (ref 0.0–0.2)

## 2019-04-19 LAB — CMP (CANCER CENTER ONLY)
ALT: 15 U/L (ref 0–44)
AST: 16 U/L (ref 15–41)
Albumin: 3.8 g/dL (ref 3.5–5.0)
Alkaline Phosphatase: 82 U/L (ref 38–126)
Anion gap: 10 (ref 5–15)
BUN: 13 mg/dL (ref 8–23)
CO2: 24 mmol/L (ref 22–32)
Calcium: 9.2 mg/dL (ref 8.9–10.3)
Chloride: 105 mmol/L (ref 98–111)
Creatinine: 0.95 mg/dL (ref 0.44–1.00)
GFR, Est AFR Am: >60 mL/min (ref >60)
GFR, Estimated: >60 mL/min (ref >60)
Glucose, Bld: 184 mg/dL — ABNORMAL HIGH (ref 70–99)
Potassium: 4 mmol/L (ref 3.5–5.1)
Sodium: 139 mmol/L (ref 135–145)
Total Bilirubin: 0.3 mg/dL (ref 0.3–1.2)
Total Protein: 6.9 g/dL (ref 6.5–8.1)

## 2019-04-19 MED ORDER — PALONOSETRON HCL INJECTION 0.25 MG/5ML
INTRAVENOUS | Status: AC
  Filled 2019-04-19: qty 5

## 2019-04-19 MED ORDER — FAMOTIDINE IN NACL 20-0.9 MG/50ML-% IV SOLN
20.0000 mg | Freq: Once | INTRAVENOUS | Status: AC
  Administered 2019-04-19: 20 mg via INTRAVENOUS

## 2019-04-19 MED ORDER — DIPHENHYDRAMINE HCL 50 MG/ML IJ SOLN
INTRAMUSCULAR | Status: AC
  Filled 2019-04-19: qty 1

## 2019-04-19 MED ORDER — PALONOSETRON HCL INJECTION 0.25 MG/5ML
0.2500 mg | Freq: Once | INTRAVENOUS | Status: AC
  Administered 2019-04-19: 0.25 mg via INTRAVENOUS

## 2019-04-19 MED ORDER — SODIUM CHLORIDE 0.9% FLUSH
10.0000 mL | Freq: Once | INTRAVENOUS | Status: AC
  Administered 2019-04-19: 10 mL
  Filled 2019-04-19: qty 10

## 2019-04-19 MED ORDER — DOXORUBICIN HCL LIPOSOMAL CHEMO INJECTION 2 MG/ML
31.0000 mg/m2 | Freq: Once | INTRAVENOUS | Status: AC
  Administered 2019-04-19: 60 mg via INTRAVENOUS
  Filled 2019-04-19: qty 10

## 2019-04-19 MED ORDER — HEPARIN SOD (PORK) LOCK FLUSH 100 UNIT/ML IV SOLN
500.0000 [IU] | Freq: Once | INTRAVENOUS | Status: DC
  Filled 2019-04-19: qty 5

## 2019-04-19 MED ORDER — SODIUM CHLORIDE 0.9 % IV SOLN
Freq: Once | INTRAVENOUS | Status: AC
  Filled 2019-04-19: qty 5

## 2019-04-19 MED ORDER — HEPARIN SOD (PORK) LOCK FLUSH 100 UNIT/ML IV SOLN
500.0000 [IU] | Freq: Once | INTRAVENOUS | Status: AC | PRN
  Administered 2019-04-19: 500 [IU]
  Filled 2019-04-19: qty 5

## 2019-04-19 MED ORDER — SODIUM CHLORIDE 0.9% FLUSH
10.0000 mL | INTRAVENOUS | Status: DC | PRN
  Administered 2019-04-19: 10 mL
  Filled 2019-04-19: qty 10

## 2019-04-19 MED ORDER — FAMOTIDINE IN NACL 20-0.9 MG/50ML-% IV SOLN
INTRAVENOUS | Status: AC
  Filled 2019-04-19: qty 50

## 2019-04-19 MED ORDER — DEXTROSE 5 % IV SOLN
Freq: Once | INTRAVENOUS | Status: AC
  Filled 2019-04-19: qty 250

## 2019-04-19 MED ORDER — DIPHENHYDRAMINE HCL 50 MG/ML IJ SOLN
25.0000 mg | Freq: Once | INTRAMUSCULAR | Status: AC
  Administered 2019-04-19: 25 mg via INTRAVENOUS

## 2019-04-19 MED ORDER — SODIUM CHLORIDE 0.9 % IV SOLN
514.5000 mg | Freq: Once | INTRAVENOUS | Status: AC
  Administered 2019-04-19: 510 mg via INTRAVENOUS
  Filled 2019-04-19: qty 51

## 2019-04-19 NOTE — Telephone Encounter (Signed)
Scheduled apt per 12/28 sch message - pt to get an updated schedule in the treatment area after chemo

## 2019-04-19 NOTE — Assessment & Plan Note (Signed)
Her skin rash is almost completely resolved She will continue to take Benadryl as needed

## 2019-04-19 NOTE — Assessment & Plan Note (Signed)
So far, she tolerated treatment well without major side effects I do not believe her rash is related to treatment We will proceed with treatment without dose adjustment I recommend minimum 3 cycles of treatment before repeat imaging study

## 2019-04-19 NOTE — Assessment & Plan Note (Signed)
She has mild intermittent elevated blood pressure, likely exacerbated by recent prednisone treatment She is not symptomatic Observe for now

## 2019-04-19 NOTE — Progress Notes (Signed)
[place] OFFICE PROGRESS NOTE  Patient Care Team: Gaynelle Arabian, MD as PCP - General (Family Medicine) Heath Lark, MD as Consulting Physician (Hematology and Oncology) Everitt Amber, MD as Consulting Physician (Obstetrics and Gynecology)  ASSESSMENT & PLAN:  Adenocarcinoma of right fallopian tube [facility]) So far, she tolerated treatment well without major side effects I do not believe her rash is related to treatment We will proceed with treatment without dose adjustment I recommend minimum 3 cycles of treatment before repeat imaging study  Essential hypertension She has mild intermittent elevated blood pressure, likely exacerbated by recent prednisone treatment She is not symptomatic Observe for now  Drug-induced skin rash Her skin rash is almost completely resolved She will continue to take Benadryl as needed   No orders of the defined types were placed in this encounter.   INTERVAL HISTORY: Please see below for problem oriented charting. She returns for further follow-up Her skin rash is almost completely resolved She tolerated last cycle of treatment well without side effects such as infusion reaction, nausea or changes in bowel habits She had mild fluid retention likely secondary to prednisone  SUMMARY OF ONCOLOGIC HISTORY: Oncology History Overview Note  Neg genetics   Adenocarcinoma of right fallopian tube ([place])  08/12/2016 Initial Diagnosis   The patient has many months of vague symptoms of fullness in the pelvis, urinary frequency and difficulty with defecation. She denies vaginal bleeding or rectal bleeding.    08/12/2016 Imaging   Abdomen X-ray in the ER: Moderate colonic stool burden without evidence of enteric obstruction   11/13/2016 Imaging   TVUS was performed on 11/13/16 which showed a large hypoechoic lobular mass seen posterior to the uterus measuring 18.2x16.1x11.8cm, blood flow was seen along the periphery of the mass. It was  considered to be likely to be a fibroid vs pelvic mass vs ovarian mass. Neither ovary was removed. Moderate amount of free fluid in the pelvis. THe uterus measures 4x10x4cm. The endometrium is 78m.    12/05/2016 Imaging   MR pelvis 1. Mild limitations as detailed above. 2. Heterogeneous pelvic mass is favored to arise from the right ovary. Favor a solid ovarian neoplasm such as fibroma/Brenner's tumor. Given lesion size and heterogeneous T2 signal out, complicating torsion cannot be excluded. 3. Moderate abdominopelvic ascites, without specific evidence of peritoneal metastasis. Consider further evaluation with contrast-enhanced abdominopelvic CT.    12/26/2016 Pathology Results   1. Uterus, ovaries and fallopian tubes - ADENOCARCINOMA INVOLVING RIGHT FALLOPIAN TUBE, RIGHT AND LEFT OVARIES, UTERINE SEROSA AND PELVIC MASS. - SEE ONCOLOGY TABLE AND COMMENT. - UTERINE CERVIX, ENDOMETRIUM, MYOMETRIUM AND LEFT FALLOPIAN TUBE FREE OF TUMOR. 2. Omentum, resection for tumor - ADENOCARCINOMA. Microscopic Comment 1. ONCOLOGY TABLE - FALLOPIAN TUBE. SEE COMMENT 1. Specimen, including laterality: Uterus, bilateral adnexa, pelvic mass and omentum. 2. Procedure: Hysterectomy with bilateral salpingo-oophorectomy, pelvic mass excision and omentectomy. 3. Lymph node sampling performed: No 4. Tumor site: See comment. 5. Tumor location in fallopian tube: Right fallopian tube fimbria 6. Specimen integrity (intact/ruptured/disrupted): Intact 7. Tumor size (cm): 18 cm, see comment. 8. Histologic type: Adenocarcinoma 9. Grade: High grade 10. Microscopic tumor extension: Tumor involves right fallopian tube, right and left ovaries, uterine serosa, pelvic mass and omentum. 11. Margins: See comment. 12. Lymph-Vascular invasion: Present 13. Lymph nodes: # examined: 0; # positive: N/A 14. TNM: pT3c, pNX 15. FIGO Stage (based on pathologic findings, needs clinical correlation: III-C 16. Comments: There is an 18 cm  pelvic mass which is a high grade  adenocarcinoma and there is a 12.2 cm  segment of omentum which is extensively involved with adenocarcinoma. The tumor also involves the fimbria of the right fallopian tube and the parenchyma of the right and left ovaries as well as uterine serosa. The fimbria of the right fallopian has intraepithelial atypia consistent with a precursor lesion and therefore this is most consistent with primary fallopian tube adenocarcinoma. A primary peritoneal serous carcinoma is a less likely possibility.    12/26/2016 Pathology Results   PERITONEAL/ASCITIC FLUID(SPECIMEN 1 OF 1 COLLECTED 12/26/16): POORLY DIFFERENTIATED ADENOCARCINOMA   12/26/2016 Surgery   Procedure(s) Performed: Exploratory laparotomy with total abdominal hysterectomy, bilateral salpingo-oophorectomy, omentectomy radical tumor debulking for ovarian cancer .  Surgeon: Thereasa Solo, MD.   Operative Findings: 20cm left ovarian mass densely adherent to the posterior uterus, right tube and ovary, cervix, sigmoid colon. 10cm omental cake. 4L ascites. 326m size tumor nodules on the serosa of the terminal ileum and proximal sigmoid colon. 155mnodules on right diaphragm.    This represented an optimal cytoreduction (R1) with 26m85modules on intestine and diaphragm representing gross visible disease   01/16/2017 Procedure   Placement of single lumen port a cath via right internal jugular vein. The catheter tip lies at the cavo-atrial junction. A power injectable port a cath was placed and is ready for immediate use.   01/17/2017 Imaging   1. 3.5 x 7.2 x 4.6 cm fluid collection along the vaginal cuff, posterior the bladder and extending into the right adnexal space. This lesion demonstrates rim enhancement. Imaging features could be related to a loculated postoperative seroma or hematoma. Superinfection cannot be excluded by CT. Given debulking surgery was 3 weeks ago, this entire structure is un likely to represent neoplasm,  but peritoneal involvement could have this appearance. 2. Small fluid collection in the left para colic gutter without rim enhancement. 3. Irregular/nodular appearance of the peritoneal M in the anatomic pelvis with areas of subtle nodularity between the stomach and the spleen. Close attention in these regions on follow-up recommended as metastatic disease is a concern. 4. Mildly enlarged hepatoduodenal ligament lymph node. Attention on follow-up recommended.   01/20/2017 Tumor Marker   Patient's tumor was tested for the following markers: CA-125 Results of the tumor marker test revealed 46.6   01/28/2017 Genetic Testing       Negative genetic testing on the [facility].  The [facility] panel offered by [employer] sequencing and deletion/duplication testing of the following 28 genes: APC, ATM, BARD1, BMPR1A, BRCA1, BRCA2, BRIP1, CHD1, CDK4, CDKN2A, CHEK2, EPCAM (large rearrangement only), MLH1, MSH2, MSH6, MUTYH, NBN, PALB2, PMS2, PTEN, RAD51C, RAD51D, SMAD4, STK11, and TP53. Sequencing was performed for select regions of POLE and POLD1, and large rearrangement analysis was performed for select regions of GREM1. The report date is January 28, 2017.  HRD testing looking for genomic instability and BRCA mutations was negative.  The report date of this test is January 27, 2017.    01/31/2017 Tumor Marker   Patient's tumor was tested for the following markers: CA-125 Results of the tumor marker test revealed 22.4   03/04/2017 Tumor Marker   Patient's tumor was tested for the following markers: CA-125 Results of the tumor marker test revealed 13.8   04/18/2017 Tumor Marker   Patient's tumor was tested for the following markers: CA-125 Results of the tumor marker test revealed 11.9   05/05/2017 Tumor Marker   Patient's tumor was tested for the following markers: CA-125 Results  of the tumor marker test revealed 12   05/26/2017 Tumor Marker   Patient's tumor was  tested for the following markers: CA-125 Results of the tumor marker test revealed 10.6   05/26/2017 Imaging   1. A previously noted postoperative fluid collection in the low pelvis and residual ascites seen on the prior examination has resolved on today's study. No findings to suggest residual/recurrent disease on today's examination. No definite solid organ metastasis identified in the abdomen or pelvis. No lymphadenopathy. 2. Aortic atherosclerosis.   06/10/2017 Procedure   Successful right IJ vein Port-A-Cath explant.   03/09/2018 Tumor Marker   Patient's tumor was tested for the following markers: CA-125 Results of the tumor marker test revealed 12.1   05/26/2018 Tumor Marker   Patient's tumor was tested for the following markers: CA-125 Results of the tumor marker test revealed 13.8   09/09/2018 Tumor Marker   Patient's tumor was tested for the following markers: CA-125 Results of the tumor marker test revealed 23.9   12/18/2018 Tumor Marker   Patient's tumor was tested for the following markers: CA-125 Results of the tumor marker test revealed 43.8   02/27/2019 - 02/28/2019 Hospital Admission   She was admitted for bowel obstruction   02/27/2019 Imaging   1. High-grade small bowel obstruction with transition point in the pelvis in an area of suspected desmoplastic reaction surrounding a 1.3 cm spiculated nodule in the mesentery, suspicious for carcinoid. There is hyperenhancement near the tip of the appendix and loss of the normal fat plane between it and the adjacent sigmoid colon, potentially the site of primary lesion. 2. Multiple new small subcentimeter hyperdense lesions scattered along the liver capsule, concerning for metastases. Enlarged hyperenhancing gastrohepatic and portacaval lymph nodes concerning for nodal metastases. 3. Very mild right hydroureteronephrosis to the level of the desmoplastic reaction in the pelvis, potentially involving the right ureter. 4. Infraumbilical  ventral abdominal wall diastasis containing a small nondilated portion of transverse colon. 5. Trace ascites. 6. Cholelithiasis. 7.  Aortic atherosclerosis (ICD10-I70.0).     03/05/2019 Tumor Marker   Patient's tumor was tested for the following markers: CA-125 Results of the tumor marker test revealed 70.1   03/12/2019 Echocardiogram   IMPRESSIONS     1. Left ventricular ejection fraction, by visual estimation, is 60 to 65%. The left ventricle has normal function. There is no left ventricular hypertrophy.  2. Left ventricular diastolic parameters are consistent with Grade I diastolic dysfunction (impaired relaxation).  3. Global right ventricle has normal systolic function.The right ventricular size is normal. No increase in right ventricular wall thickness.  4. Left atrial size was normal.  5. Right atrial size was normal.  6. The pericardium was not assessed.  7. The mitral valve is normal in structure. No evidence of mitral valve regurgitation.  8. The tricuspid valve is normal in structure. Tricuspid valve regurgitation is trivial.  9. The aortic valve is normal in structure. Aortic valve regurgitation is not visualized. 10. The pulmonic valve was grossly normal. Pulmonic valve regurgitation is not visualized. 11. The average left ventricular global longitudinal strain is -17.6 %.   03/16/2019 Procedure   Successful placement of a right internal jugular approach power injectable Port-A-Cath. The catheter is ready for immediate use.       REVIEW OF SYSTEMS:   Constitutional: Denies fevers, chills or abnormal weight loss Eyes: Denies blurriness of vision Ears, nose, mouth, throat, and face: Denies mucositis or sore throat Respiratory: Denies cough, dyspnea or wheezes Cardiovascular: Denies palpitation,  chest discomfort or lower extremity swelling Gastrointestinal:  Denies nausea, heartburn or change in bowel habits Lymphatics: Denies new lymphadenopathy or easy  bruising Neurological:Denies numbness, tingling or new weaknesses Behavioral/Psych: Mood is stable, no new changes  All other systems were reviewed with the patient and are negative.  I have reviewed the past medical history, past surgical history, social history and family history with the patient and they are unchanged from previous note.  ALLERGIES:  is allergic to dilaudid [hydromorphone hcl]; morphine and related; and sudafed [pseudoephedrine hcl].  MEDICATIONS:  Current Outpatient Medications  Medication Sig Dispense Refill  . acetaminophen (TYLENOL) 325 MG tablet Take 975 mg by mouth every 6 (six) hours as needed (pain).    Marland Kitchen lidocaine-prilocaine (EMLA) cream APP EXT AA 1 TIME    . ondansetron (ZOFRAN) 8 MG tablet Take 1 tablet (8 mg total) by mouth every 8 (eight) hours as needed. 30 tablet 1  . prochlorperazine (COMPAZINE) 10 MG tablet Take 1 tablet (10 mg total) by mouth every 6 (six) hours as needed (Nausea or vomiting). 30 tablet 1  . venlafaxine XR (EFFEXOR-XR) 37.5 MG 24 hr capsule TAKE 1 CAPSULE BY MOUTH DAILY WITH BREAKFAST (Patient taking differently: Take 37.5 mg by mouth daily with breakfast. ) 90 capsule 6   No current facility-administered medications for this visit.   Facility-Administered Medications Ordered in Other Visits  Medication Dose Route Frequency Provider Last Rate Last Admin  . sodium chloride flush (NS) 0.9 % injection 10 mL  10 mL Intravenous PRN Alvy Bimler, Tamitha Norell, MD   10 mL at 02/11/17 0839  . sodium chloride flush (NS) 0.9 % injection 10 mL  10 mL Intravenous PRN Alvy Bimler, Jamilee Lafosse, MD   10 mL at 03/04/17 1510  . sodium chloride flush (NS) 0.9 % injection 10 mL  10 mL Intracatheter PRN Alvy Bimler, Emberlynn Riggan, MD   10 mL at 04/19/19 1400    PHYSICAL EXAMINATION: ECOG PERFORMANCE STATUS: 1 - Symptomatic but completely ambulatory  Vitals:   04/19/19 0942  BP: (!) 148/65  Pulse: 73  Resp: 18  Temp: 98.3 F (36.8 C)  SpO2: 100%   Filed Weights   04/19/19 0942   Weight: 179 lb 6.4 oz (81.4 kg)    GENERAL:alert, no distress and comfortable SKIN: Her previous skin rash is almost completely resolved  EYES: normal, Conjunctiva are pink and non-injected, sclera clear OROPHARYNX:no exudate, no erythema and lips, buccal mucosa, and tongue normal  NECK: supple, thyroid normal size, non-tender, without nodularity LYMPH:  no palpable lymphadenopathy in the cervical, axillary or inguinal LUNGS: clear to auscultation and percussion with normal breathing effort HEART: regular rate & rhythm and no murmurs and no lower extremity edema ABDOMEN:abdomen soft, non-tender and normal bowel sounds Musculoskeletal:no cyanosis of digits and no clubbing  NEURO: alert & oriented x 3 with fluent speech, no focal motor/sensory deficits  LABORATORY DATA:  I have reviewed the data as listed    Component Value Date/Time   NA 139 04/19/2019 0933   NA 139 04/14/2017 0742   K 4.0 04/19/2019 0933   K 4.0 04/14/2017 0742   CL 105 04/19/2019 0933   CO2 24 04/19/2019 0933   CO2 21 (L) 04/14/2017 0742   GLUCOSE 184 (H) 04/19/2019 0933   GLUCOSE 247 (H) 04/14/2017 0742   BUN 13 04/19/2019 0933   BUN 13.1 04/14/2017 0742   CREATININE 0.95 04/19/2019 0933   CREATININE 0.9 04/14/2017 0742   CALCIUM 9.2 04/19/2019 0933   CALCIUM 9.2 04/14/2017 0742  PROT 6.9 04/19/2019 0933   PROT 7.3 04/14/2017 0742   ALBUMIN 3.8 04/19/2019 0933   ALBUMIN 3.9 04/14/2017 0742   AST 16 04/19/2019 0933   AST 17 04/14/2017 0742   ALT 15 04/19/2019 0933   ALT 14 04/14/2017 0742   ALKPHOS 82 04/19/2019 0933   ALKPHOS 70 04/14/2017 0742   BILITOT 0.3 04/19/2019 0933   BILITOT 0.37 04/14/2017 0742   GFRNONAA >60 04/19/2019 0933   GFRAA >60 04/19/2019 0933    No results found for: SPEP, UPEP  Lab Results  Component Value Date   WBC 4.9 04/19/2019   NEUTROABS 2.6 04/19/2019   HGB 12.4 04/19/2019   HCT 37.9 04/19/2019   MCV 94.8 04/19/2019   PLT 210 04/19/2019      Chemistry       Component Value Date/Time   NA 139 04/19/2019 0933   NA 139 04/14/2017 0742   K 4.0 04/19/2019 0933   K 4.0 04/14/2017 0742   CL 105 04/19/2019 0933   CO2 24 04/19/2019 0933   CO2 21 (L) 04/14/2017 0742   BUN 13 04/19/2019 0933   BUN 13.1 04/14/2017 0742   CREATININE 0.95 04/19/2019 0933   CREATININE 0.9 04/14/2017 0742      Component Value Date/Time   CALCIUM 9.2 04/19/2019 0933   CALCIUM 9.2 04/14/2017 0742   ALKPHOS 82 04/19/2019 0933   ALKPHOS 70 04/14/2017 0742   AST 16 04/19/2019 0933   AST 17 04/14/2017 0742   ALT 15 04/19/2019 0933   ALT 14 04/14/2017 0742   BILITOT 0.3 04/19/2019 0933   BILITOT 0.37 04/14/2017 0742      All questions were answered. The patient knows to call the clinic with any problems, questions or concerns. No barriers to learning was detected.  I spent 15 minutes counseling the patient face to face. The total time spent in the appointment was 20 minutes and more than 50% was on counseling and review of test results  Heath Lark, MD 04/19/2019 2:18 PM

## 2019-04-20 ENCOUNTER — Telehealth: Payer: Self-pay

## 2019-04-20 LAB — CA 125: Cancer Antigen (CA) 125: 39.8 U/mL — ABNORMAL HIGH (ref 0.0–38.1)

## 2019-04-20 MED FILL — PROCHLORPERAZINE 10 MG TAB: 10 | 7 days supply | Qty: 30 | Fill #1

## 2019-04-20 NOTE — Telephone Encounter (Signed)
Called and given below message. She verbalized understanding. 

## 2019-04-20 NOTE — Telephone Encounter (Signed)
-----   Message from Heath Lark, MD sent at 04/20/2019  7:14 AM EST ----- Regarding: pls let her know CA-125 is better

## 2019-05-04 ENCOUNTER — Other Ambulatory Visit: Payer: Self-pay

## 2019-05-04 ENCOUNTER — Telehealth: Payer: Self-pay

## 2019-05-04 MED ORDER — PREDNISONE 20 MG PO TABS: 20.0000 mg | ORAL_TABLET | Freq: Every day | ORAL | 0 refills | Status: AC

## 2019-05-04 NOTE — Telephone Encounter (Signed)
If not better after taking the hydrocortisone cream for 3 days, we can call in prednisone 20 mg daily for 7 days

## 2019-05-04 NOTE — Telephone Encounter (Signed)
She called and left a message to call her. She has a rash under her skin that started last week. It is nothing like before. The rash is on her arms, a few places on her legs and shoulder blades. The should blades have some sore looking areas and seems to be the worse places. It is itchy but she is not scratching it like she did the last time. She is taking 1/2 benadryl tab TID and Hydrocortisone cream to rash. She is going to try to send a picture on mychart. She is asking if she should take the prednisone again?

## 2019-05-04 NOTE — Telephone Encounter (Signed)
Called back and given below message. She verbalized udnerstanding.She has been using the hydrocortisone cream for over 3 days. Rx sent to pharmacy. She will pick it up today.

## 2019-05-14 ENCOUNTER — Other Ambulatory Visit: Payer: Self-pay | Admitting: Hematology and Oncology

## 2019-05-17 ENCOUNTER — Other Ambulatory Visit: Payer: Self-pay

## 2019-05-17 ENCOUNTER — Other Ambulatory Visit: Payer: Self-pay | Admitting: Hematology and Oncology

## 2019-05-17 ENCOUNTER — Inpatient Hospital Stay: Payer: BC Managed Care – PPO | Attending: Hematology and Oncology | Admitting: Hematology and Oncology

## 2019-05-17 ENCOUNTER — Inpatient Hospital Stay: Payer: BC Managed Care – PPO

## 2019-05-17 ENCOUNTER — Encounter: Payer: Self-pay | Admitting: Hematology and Oncology

## 2019-05-17 VITALS — BP 132/50 | HR 72 | Temp 98.0°F | Resp 18 | Ht 65.0 in | Wt 180.4 lb

## 2019-05-17 DIAGNOSIS — L270 Generalized skin eruption due to drugs and medicaments taken internally: Secondary | ICD-10-CM

## 2019-05-17 DIAGNOSIS — C5701 Malignant neoplasm of right fallopian tube: Secondary | ICD-10-CM | POA: Diagnosis not present

## 2019-05-17 DIAGNOSIS — D6481 Anemia due to antineoplastic chemotherapy: Secondary | ICD-10-CM | POA: Diagnosis not present

## 2019-05-17 DIAGNOSIS — Z5111 Encounter for antineoplastic chemotherapy: Secondary | ICD-10-CM | POA: Diagnosis not present

## 2019-05-17 DIAGNOSIS — T451X5A Adverse effect of antineoplastic and immunosuppressive drugs, initial encounter: Secondary | ICD-10-CM | POA: Diagnosis not present

## 2019-05-17 DIAGNOSIS — Z7189 Other specified counseling: Secondary | ICD-10-CM

## 2019-05-17 LAB — CMP (CANCER CENTER ONLY)
ALT: 17 U/L (ref 0–44)
AST: 14 U/L — ABNORMAL LOW (ref 15–41)
Albumin: 3.5 g/dL (ref 3.5–5.0)
Alkaline Phosphatase: 73 U/L (ref 38–126)
Anion gap: 8 (ref 5–15)
BUN: 11 mg/dL (ref 8–23)
CO2: 25 mmol/L (ref 22–32)
Calcium: 8.6 mg/dL — ABNORMAL LOW (ref 8.9–10.3)
Chloride: 106 mmol/L (ref 98–111)
Creatinine: 0.95 mg/dL (ref 0.44–1.00)
GFR, Est AFR Am: >60 mL/min (ref >60)
GFR, Estimated: >60 mL/min (ref >60)
Glucose, Bld: 195 mg/dL — ABNORMAL HIGH (ref 70–99)
Potassium: 4 mmol/L (ref 3.5–5.1)
Sodium: 139 mmol/L (ref 135–145)
Total Bilirubin: 0.3 mg/dL (ref 0.3–1.2)
Total Protein: 6.3 g/dL — ABNORMAL LOW (ref 6.5–8.1)

## 2019-05-17 LAB — CBC WITH DIFFERENTIAL (CANCER CENTER ONLY)
Abs Immature Granulocytes: 0.04 10*3/uL (ref 0.00–0.07)
Basophils Absolute: 0 10*3/uL (ref 0.0–0.1)
Basophils Relative: 1 %
Eosinophils Absolute: 0 10*3/uL (ref 0.0–0.5)
Eosinophils Relative: 1 %
HCT: 31.8 % — ABNORMAL LOW (ref 36.0–46.0)
Hemoglobin: 10.8 g/dL — ABNORMAL LOW (ref 12.0–15.0)
Immature Granulocytes: 1 %
Lymphocytes Relative: 33 %
Lymphs Abs: 1.4 10*3/uL (ref 0.7–4.0)
MCH: 32.5 pg (ref 26.0–34.0)
MCHC: 34 g/dL (ref 30.0–36.0)
MCV: 95.8 fL (ref 80.0–100.0)
Monocytes Absolute: 0.5 10*3/uL (ref 0.1–1.0)
Monocytes Relative: 13 %
Neutro Abs: 2.2 10*3/uL (ref 1.7–7.7)
Neutrophils Relative %: 51 %
Platelet Count: 216 10*3/uL (ref 150–400)
RBC: 3.32 MIL/uL — ABNORMAL LOW (ref 3.87–5.11)
RDW: 15.9 % — ABNORMAL HIGH (ref 11.5–15.5)
WBC Count: 4.2 10*3/uL (ref 4.0–10.5)
nRBC: 0 % (ref 0.0–0.2)

## 2019-05-17 MED ORDER — PALONOSETRON HCL INJECTION 0.25 MG/5ML
INTRAVENOUS | Status: AC
  Filled 2019-05-17: qty 5

## 2019-05-17 MED ORDER — PALONOSETRON HCL INJECTION 0.25 MG/5ML
0.2500 mg | Freq: Once | INTRAVENOUS | Status: AC
  Administered 2019-05-17: 0.25 mg via INTRAVENOUS

## 2019-05-17 MED ORDER — SODIUM CHLORIDE 0.9% FLUSH
10.0000 mL | Freq: Once | INTRAVENOUS | Status: AC
  Administered 2019-05-17: 10 mL
  Filled 2019-05-17: qty 10

## 2019-05-17 MED ORDER — SODIUM CHLORIDE 0.9 % IV SOLN
Freq: Once | INTRAVENOUS | Status: AC
  Filled 2019-05-17: qty 5

## 2019-05-17 MED ORDER — SODIUM CHLORIDE 0.9 % IV SOLN
514.5000 mg | Freq: Once | INTRAVENOUS | Status: AC
  Administered 2019-05-17: 510 mg via INTRAVENOUS
  Filled 2019-05-17: qty 51

## 2019-05-17 MED ORDER — SODIUM CHLORIDE 0.9% FLUSH
10.0000 mL | INTRAVENOUS | Status: DC | PRN
  Administered 2019-05-17: 10 mL
  Filled 2019-05-17: qty 10

## 2019-05-17 MED ORDER — HEPARIN SOD (PORK) LOCK FLUSH 100 UNIT/ML IV SOLN
500.0000 [IU] | Freq: Once | INTRAVENOUS | Status: AC | PRN
  Administered 2019-05-17: 500 [IU]
  Filled 2019-05-17: qty 5

## 2019-05-17 MED ORDER — FAMOTIDINE IN NACL 20-0.9 MG/50ML-% IV SOLN
INTRAVENOUS | Status: AC
  Filled 2019-05-17: qty 50

## 2019-05-17 MED ORDER — DIPHENHYDRAMINE HCL 50 MG/ML IJ SOLN
INTRAMUSCULAR | Status: AC
  Filled 2019-05-17: qty 1

## 2019-05-17 MED ORDER — DEXTROSE 5 % IV SOLN
Freq: Once | INTRAVENOUS | Status: AC
  Filled 2019-05-17: qty 250

## 2019-05-17 MED ORDER — FAMOTIDINE IN NACL 20-0.9 MG/50ML-% IV SOLN
20.0000 mg | Freq: Once | INTRAVENOUS | Status: AC
  Administered 2019-05-17: 20 mg via INTRAVENOUS

## 2019-05-17 MED ORDER — DOXORUBICIN HCL LIPOSOMAL CHEMO INJECTION 2 MG/ML
31.0000 mg/m2 | Freq: Once | INTRAVENOUS | Status: AC
  Administered 2019-05-17: 60 mg via INTRAVENOUS
  Filled 2019-05-17: qty 20

## 2019-05-17 MED ORDER — DIPHENHYDRAMINE HCL 50 MG/ML IJ SOLN
25.0000 mg | Freq: Once | INTRAMUSCULAR | Status: AC
  Administered 2019-05-17: 25 mg via INTRAVENOUS

## 2019-05-17 NOTE — Assessment & Plan Note (Signed)
It is not clear to me that the skin rash is due to chemotherapy I suspect is either inflamed seborrheic keratosis or eczema We will continue conservative approach

## 2019-05-17 NOTE — Progress Notes (Signed)
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 The patient denies recent history of bleeding such as epistaxis, hematuria or hematochezia. She is asymptomatic from the anemia. I will observe for now.  She does not require transfusion now. I will continue the chemotherapy at current dose without dosage adjustment.  If the anemia gets progressive worse in the future, I might have to delay her treatment or adjust the chemotherapy dose.    Orders Placed This Encounter  Procedures  . CT ABDOMEN PELVIS W CONTRAST    Standing Status:   Future    Standing Expiration Date:   05/16/2020    Order Specific Question:   If indicated for the ordered procedure, I authorize the administration of contrast media per Radiology protocol    Answer:   Yes    Order Specific Question:   Preferred imaging location?    Answer:   [facility]    Order Specific Question:   Radiology Contrast Protocol - do NOT remove file path    Answer:   _[email or web address]    Order Specific Question:   ** REASON FOR EXAM (FREE TEXT)    Answer:   assess chemo response     All questions were answered. The patient knows to call the clinic with any problems, questions or concerns. The total time spent in the appointment was 20 minutes encounter with patients including review of chart and various tests results, discussions about plan of care and coordination of care plan   Heath Lark, MD 05/17/2019 10:47 AM  INTERVAL HISTORY: Please see below for problem oriented charting. 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 She denies vaginal bleeding or rectal bleeding.    08/12/2016 Imaging   Abdomen X-ray in the ER: Moderate colonic stool burden without evidence of enteric obstruction   11/13/2016 Imaging   TVUS was performed on 11/13/16 which showed a large hypoechoic lobular mass seen posterior to the uterus measuring 18.2x16.1x11.8cm, blood flow was seen along the periphery of the mass. It was considered to be likely to be a fibroid vs pelvic mass vs ovarian mass. Neither ovary was removed. Moderate amount of free fluid in the  pelvis. THe uterus measures 4x10x4cm. The endometrium is 59m.    12/05/2016 Imaging   MR pelvis 1. Mild limitations as detailed above. 2. Heterogeneous pelvic mass is favored to arise from the right ovary. Favor a solid ovarian neoplasm such as fibroma/Brenner's tumor. Given lesion size and heterogeneous T2 signal out, complicating torsion cannot be excluded. 3. Moderate abdominopelvic  ascites, without specific evidence of peritoneal metastasis. Consider further evaluation with contrast-enhanced abdominopelvic CT.    12/26/2016 Pathology Results   1. Uterus, ovaries and fallopian tubes - ADENOCARCINOMA INVOLVING RIGHT FALLOPIAN TUBE, RIGHT AND LEFT OVARIES, UTERINE SEROSA AND PELVIC MASS. - SEE ONCOLOGY TABLE AND COMMENT. - UTERINE CERVIX, ENDOMETRIUM, MYOMETRIUM AND LEFT FALLOPIAN TUBE FREE OF TUMOR. 2. Omentum, resection for tumor - ADENOCARCINOMA. Microscopic Comment 1. ONCOLOGY TABLE - FALLOPIAN TUBE. SEE COMMENT 1. Specimen, including laterality: Uterus, bilateral adnexa, pelvic mass and omentum. 2. Procedure: Hysterectomy with bilateral salpingo-oophorectomy, pelvic mass excision and omentectomy. 3. Lymph node sampling performed: No 4. Tumor site: See comment. 5. Tumor location in fallopian tube: Right fallopian tube fimbria 6. Specimen integrity (intact/ruptured/disrupted): Intact 7. Tumor size (cm): 18 cm, see comment. 8. Histologic type: Adenocarcinoma 9. Grade: High grade 10. Microscopic tumor extension: Tumor involves right fallopian tube, right and left ovaries, uterine serosa, pelvic mass and omentum. 11. Margins: See comment. 12. Lymph-Vascular invasion: Present 13. Lymph nodes: # examined: 0; # positive: N/A 14. TNM: pT3c, pNX 15. FIGO Stage (based on pathologic findings, needs clinical correlation: III-C 16. Comments: There is an 18 cm pelvic mass which is a high grade adenocarcinoma and there is a 12.2 cm  segment of omentum which is extensively involved with adenocarcinoma. The tumor also involves the fimbria of the right fallopian tube and the parenchyma of the right and left ovaries as well as uterine serosa. The fimbria of the right fallopian has intraepithelial atypia consistent with a precursor lesion and therefore this is most consistent with primary fallopian tube adenocarcinoma. A primary peritoneal serous carcinoma is a less likely possibility.     12/26/2016 Pathology Results   PERITONEAL/ASCITIC FLUID(SPECIMEN 1 OF 1 COLLECTED 12/26/16): POORLY DIFFERENTIATED ADENOCARCINOMA   12/26/2016 Surgery   Procedure(s) Performed: Exploratory laparotomy with total abdominal hysterectomy, bilateral salpingo-oophorectomy, omentectomy radical tumor debulking for ovarian cancer .  Surgeon: EThereasa Solo MD.   Operative Findings: 20cm left ovarian mass densely adherent to the posterior uterus, right tube and ovary, cervix, sigmoid colon. 10cm omental cake. 4L ascites. 124msize tumor nodules on the serosa of the terminal ileum and proximal sigmoid colon. 43m643modules on right diaphragm.    This represented an optimal cytoreduction (R1) with 43mm443mdules on intestine and diaphragm representing gross visible disease   01/16/2017 Procedure   Placement of single lumen port a cath via right internal jugular vein. The catheter tip lies at the cavo-atrial junction. A power injectable port a cath was placed and is ready for immediate use.   01/17/2017 Imaging   1. 3.5 x 7.2 x 4.6 cm fluid collection along the vaginal cuff, posterior the bladder and extending into the right adnexal space. This lesion demonstrates rim enhancement. Imaging features could be related to a loculated postoperative seroma or hematoma. Superinfection cannot be excluded by CT. Given debulking surgery was 3 weeks ago, this entire structure is un likely to represent neoplasm, but peritoneal involvement could have this appearance. 2. Small fluid collection in the left para colic gutter without rim enhancement. 3. Irregular/nodular appearance of the peritoneal M in the anatomic pelvis  with areas of subtle nodularity between the stomach and the spleen. Close attention in these regions on follow-up recommended as metastatic disease is a concern. 4. Mildly enlarged hepatoduodenal ligament lymph node. Attention on follow-up recommended.   01/20/2017 Tumor Marker   Patient's tumor was tested for  the following markers: CA-125 Results of the tumor marker test revealed 46.6   01/28/2017 Genetic Testing       Negative genetic testing on the [facility] panel.  The [facility] gene panel offered by [employer] includes sequencing and deletion/duplication testing of the following 28 genes: APC, ATM, BARD1, BMPR1A, BRCA1, BRCA2, BRIP1, CHD1, CDK4, CDKN2A, CHEK2, EPCAM (large rearrangement only), MLH1, MSH2, MSH6, MUTYH, NBN, PALB2, PMS2, PTEN, RAD51C, RAD51D, SMAD4, STK11, and TP53. Sequencing was performed for select regions of POLE and POLD1, and large rearrangement analysis was performed for select regions of GREM1. The report date is January 28, 2017.  HRD testing looking for genomic instability and BRCA mutations was negative.  The report date of this test is January 27, 2017.    01/31/2017 Tumor Marker   Patient's tumor was tested for the following markers: CA-125 Results of the tumor marker test revealed 22.4   03/04/2017 Tumor Marker   Patient's tumor was tested for the following markers: CA-125 Results of the tumor marker test revealed 13.8   04/18/2017 Tumor Marker   Patient's tumor was tested for the following markers: CA-125 Results of the tumor marker test revealed 11.9   05/05/2017 Tumor Marker   Patient's tumor was tested for the following markers: CA-125 Results of the tumor marker test revealed 12   05/26/2017 Tumor Marker   Patient's tumor was tested for the following markers: CA-125 Results of the tumor marker test revealed 10.6   05/26/2017 Imaging   1. A previously noted postoperative fluid collection in the low pelvis and residual ascites seen on the prior examination has resolved on today's study. No findings to suggest residual/recurrent disease on today's examination. No definite solid organ metastasis identified in the abdomen or pelvis. No lymphadenopathy. 2. Aortic atherosclerosis.   06/10/2017 Procedure   Successful right IJ vein Port-A-Cath  explant.   03/09/2018 Tumor Marker   Patient's tumor was tested for the following markers: CA-125 Results of the tumor marker test revealed 12.1   05/26/2018 Tumor Marker   Patient's tumor was tested for the following markers: CA-125 Results of the tumor marker test revealed 13.8   09/09/2018 Tumor Marker   Patient's tumor was tested for the following markers: CA-125 Results of the tumor marker test revealed 23.9   12/18/2018 Tumor Marker   Patient's tumor was tested for the following markers: CA-125 Results of the tumor marker test revealed 43.8   02/27/2019 - 02/28/2019 Hospital Admission   She was admitted for bowel obstruction   02/27/2019 Imaging   1. High-grade small bowel obstruction with transition point in the pelvis in an area of suspected desmoplastic reaction surrounding a 1.3 cm spiculated nodule in the mesentery, suspicious for carcinoid. There is hyperenhancement near the tip of the appendix and loss of the normal fat plane between it and the adjacent sigmoid colon, potentially the site of primary lesion. 2. Multiple new small subcentimeter hyperdense lesions scattered along the liver capsule, concerning for metastases. Enlarged hyperenhancing gastrohepatic and portacaval lymph nodes concerning for nodal metastases. 3. Very mild right hydroureteronephrosis to the level of the desmoplastic reaction in the pelvis, potentially involving the right ureter. 4. Infraumbilical ventral abdominal wall diastasis containing a small  nondilated portion of transverse colon. 5. Trace ascites. 6. Cholelithiasis. 7.  Aortic atherosclerosis (ICD10-I70.0).     03/05/2019 Tumor Marker   Patient's tumor was tested for the following markers: CA-125 Results of the tumor marker test revealed 70.1   03/12/2019 Echocardiogram   IMPRESSIONS     1. Left ventricular ejection fraction, by visual estimation, is 60 to 65%. The left ventricle has normal function. There is no left ventricular  hypertrophy.  2. Left ventricular diastolic parameters are consistent with Grade I diastolic dysfunction (impaired relaxation).  3. Global right ventricle has normal systolic function.The right ventricular size is normal. No increase in right ventricular wall thickness.  4. Left atrial size was normal.  5. Right atrial size was normal.  6. The pericardium was not assessed.  7. The mitral valve is normal in structure. No evidence of mitral valve regurgitation.  8. The tricuspid valve is normal in structure. Tricuspid valve regurgitation is trivial.  9. The aortic valve is normal in structure. Aortic valve regurgitation is not visualized. 10. The pulmonic valve was grossly normal. Pulmonic valve regurgitation is not visualized. 11. The average left ventricular global longitudinal strain is -17.6 %.   03/16/2019 Procedure   Successful placement of a right internal jugular approach power injectable Port-A-Cath. The catheter is ready for immediate use.     04/19/2019 Tumor Marker   Patient's tumor was tested for the following markers: CA-125 Results of the tumor marker test revealed 39.8     REVIEW OF SYSTEMS:   Constitutional: Denies fevers, chills or abnormal weight loss Eyes: Denies blurriness of vision Ears, nose, mouth, throat, and face: Denies mucositis or sore throat Respiratory: Denies cough, dyspnea or wheezes Cardiovascular: Denies palpitation, chest discomfort or lower extremity swelling Gastrointestinal:  Denies nausea, heartburn or change in bowel habits Lymphatics: Denies new lymphadenopathy or easy bruising Neurological:Denies numbness, tingling or new weaknesses Behavioral/Psych: Mood is stable, no new changes  All other systems were reviewed with the patient and are negative.  I have reviewed the past medical history, past surgical history, social history and family history with the patient and they are unchanged from previous note.  ALLERGIES:  is allergic to dilaudid  [hydromorphone hcl]; morphine and related; and sudafed [pseudoephedrine hcl].  MEDICATIONS:  Current Outpatient Medications  Medication Sig Dispense Refill  . acetaminophen (TYLENOL) 325 MG tablet Take 975 mg by mouth every 6 (six) hours as needed (pain).    Marland Kitchen lidocaine-prilocaine (EMLA) cream APP EXT AA 1 TIME    . ondansetron (ZOFRAN) 8 MG tablet Take 1 tablet (8 mg total) by mouth every 8 (eight) hours as needed. 30 tablet 1  . prochlorperazine (COMPAZINE) 10 MG tablet Take 1 tablet (10 mg total) by mouth every 6 (six) hours as needed (Nausea or vomiting). 30 tablet 1  . venlafaxine XR (EFFEXOR-XR) 37.5 MG 24 hr capsule TAKE 1 CAPSULE BY MOUTH DAILY WITH BREAKFAST (Patient taking differently: Take 37.5 mg by mouth daily with breakfast. ) 90 capsule 6   No current facility-administered medications for this visit.   Facility-Administered Medications Ordered in Other Visits  Medication Dose Route Frequency Provider Last Rate Last Admin  . sodium chloride flush (NS) 0.9 % injection 10 mL  10 mL Intravenous PRN Alvy Bimler, Ni, MD   10 mL at 02/11/17 0839  . sodium chloride flush (NS) 0.9 % injection 10 mL  10 mL Intravenous PRN Alvy Bimler, Ni, MD   10 mL at 03/04/17 1510    PHYSICAL EXAMINATION: ECOG PERFORMANCE STATUS:  0 - Asymptomatic  Vitals:   05/17/19 1013  BP: (!) 132/50  Pulse: 72  Resp: 18  Temp: 98 F (36.7 C)  SpO2: 100%   Filed Weights   05/17/19 1013  Weight: 180 lb 6.4 oz (81.8 kg)    GENERAL:alert, no distress and comfortable SKIN: Noted persistent skin rash on her legs EYES: normal, Conjunctiva are pink and non-injected, sclera clear OROPHARYNX:no exudate, no erythema and lips, buccal mucosa, and tongue normal  NECK: supple, thyroid normal size, non-tender, without nodularity LYMPH:  no palpable lymphadenopathy in the cervical, axillary or inguinal LUNGS: clear to auscultation and percussion with normal breathing effort HEART: regular rate & rhythm and no murmurs and  no lower extremity edema ABDOMEN:abdomen soft, non-tender and normal bowel sounds Musculoskeletal:no cyanosis of digits and no clubbing  NEURO: alert & oriented x 3 with fluent speech, no focal motor/sensory deficits  LABORATORY DATA:  I have reviewed the data as listed    Component Value Date/Time   NA 139 05/17/2019 1000   NA 139 04/14/2017 0742   K 4.0 05/17/2019 1000   K 4.0 04/14/2017 0742   CL 106 05/17/2019 1000   CO2 25 05/17/2019 1000   CO2 21 (L) 04/14/2017 0742   GLUCOSE 195 (H) 05/17/2019 1000   GLUCOSE 247 (H) 04/14/2017 0742   BUN 11 05/17/2019 1000   BUN 13.1 04/14/2017 0742   CREATININE 0.95 05/17/2019 1000   CREATININE 0.9 04/14/2017 0742   CALCIUM 8.6 (L) 05/17/2019 1000   CALCIUM 9.2 04/14/2017 0742   PROT 6.3 (L) 05/17/2019 1000   PROT 7.3 04/14/2017 0742   ALBUMIN 3.5 05/17/2019 1000   ALBUMIN 3.9 04/14/2017 0742   AST 14 (L) 05/17/2019 1000   AST 17 04/14/2017 0742   ALT 17 05/17/2019 1000   ALT 14 04/14/2017 0742   ALKPHOS 73 05/17/2019 1000   ALKPHOS 70 04/14/2017 0742   BILITOT 0.3 05/17/2019 1000   BILITOT 0.37 04/14/2017 0742   GFRNONAA >60 05/17/2019 1000   GFRAA >60 05/17/2019 1000    No results found for: SPEP, UPEP  Lab Results  Component Value Date   WBC 4.2 05/17/2019   NEUTROABS 2.2 05/17/2019   HGB 10.8 (L) 05/17/2019   HCT 31.8 (L) 05/17/2019   MCV 95.8 05/17/2019   PLT 216 05/17/2019      Chemistry      Component Value Date/Time   NA 139 05/17/2019 1000   NA 139 04/14/2017 0742   K 4.0 05/17/2019 1000   K 4.0 04/14/2017 0742   CL 106 05/17/2019 1000   CO2 25 05/17/2019 1000   CO2 21 (L) 04/14/2017 0742   BUN 11 05/17/2019 1000   BUN 13.1 04/14/2017 0742   CREATININE 0.95 05/17/2019 1000   CREATININE 0.9 04/14/2017 0742      Component Value Date/Time   CALCIUM 8.6 (L) 05/17/2019 1000   CALCIUM 9.2 04/14/2017 0742   ALKPHOS 73 05/17/2019 1000   ALKPHOS 70 04/14/2017 0742   AST 14 (L) 05/17/2019 1000   AST 17  04/14/2017 0742   ALT 17 05/17/2019 1000   ALT 14 04/14/2017 0742   BILITOT 0.3 05/17/2019 1000   BILITOT 0.37 04/14/2017 0742

## 2019-05-17 NOTE — Assessment & Plan Note (Signed)
Overall, she tolerated treatment very well without side effects She have no new symptoms We will proceed with treatment cycle 3 as scheduled She will get CT imaging done next month before cycle 4 If CT imaging show good response to treatment, I will order echocardiogram in the next visit

## 2019-05-17 NOTE — Patient Instructions (Signed)
[facility] Discharge Instructions for Patients Receiving Chemotherapy  Today you received the following chemotherapy agents carboplatin and liposomal doxorubicin.  To help prevent nausea and vomiting after your treatment, we encourage you to take your nausea medication as directed.   If you develop nausea and vomiting that is not controlled by your nausea medication, call the clinic.   BELOW ARE SYMPTOMS THAT SHOULD BE REPORTED IMMEDIATELY:  *FEVER GREATER THAN 100.5 F  *CHILLS WITH OR WITHOUT FEVER  NAUSEA AND VOMITING THAT IS NOT CONTROLLED WITH YOUR NAUSEA MEDICATION  *UNUSUAL SHORTNESS OF BREATH  *UNUSUAL BRUISING OR BLEEDING  TENDERNESS IN MOUTH AND THROAT WITH OR WITHOUT PRESENCE OF ULCERS  *URINARY PROBLEMS  *BOWEL PROBLEMS  UNUSUAL RASH Items with * indicate a potential emergency and should be followed up as soon as possible.  Feel free to call the clinic should you have any questions or concerns. The clinic phone number is [phone].  Please show the [place] at check-in to the Emergency Department and triage nurse.

## 2019-05-17 NOTE — Assessment & Plan Note (Signed)

## 2019-05-17 NOTE — Progress Notes (Signed)
Managed care team is working on [place] for patient actively. Okay to proceed w/ treatment today per Brandi/managed care team.   Demetrius Charity, PharmD, [facility] Pharmacist Pharmacy Phone: [phone] 05/17/2019

## 2019-05-18 ENCOUNTER — Telehealth: Payer: Self-pay

## 2019-05-18 LAB — CA 125: Cancer Antigen (CA) 125: 27 U/mL (ref 0.0–38.1)

## 2019-05-18 NOTE — Telephone Encounter (Signed)
Called and left below message. Ask her to call the office for questions. ?

## 2019-05-18 NOTE — Telephone Encounter (Signed)
-----   Message from Heath Lark, MD sent at 05/18/2019  7:16 AM EST ----- Regarding: pls call and let ehr know CA-125 is now normal

## 2019-05-22 ENCOUNTER — Other Ambulatory Visit: Payer: Self-pay

## 2019-05-22 ENCOUNTER — Emergency Department (HOSPITAL_COMMUNITY)
Admission: EM | Admit: 2019-05-22 | Discharge: 2019-05-22 | Disposition: A | Discharge status: Home / Self Care | Payer: BC Managed Care – PPO | Attending: Emergency Medicine | Admitting: Emergency Medicine

## 2019-05-22 ENCOUNTER — Encounter (HOSPITAL_COMMUNITY): Payer: Self-pay

## 2019-05-22 DIAGNOSIS — Z8543 Personal history of malignant neoplasm of ovary: Secondary | ICD-10-CM | POA: Diagnosis not present

## 2019-05-22 DIAGNOSIS — I10 Essential (primary) hypertension: Secondary | ICD-10-CM | POA: Insufficient documentation

## 2019-05-22 DIAGNOSIS — R112 Nausea with vomiting, unspecified: Secondary | ICD-10-CM

## 2019-05-22 LAB — CBC WITH DIFFERENTIAL/PLATELET
Abs Immature Granulocytes: 0.03 10*3/uL (ref 0.00–0.07)
Basophils Absolute: 0 10*3/uL (ref 0.0–0.1)
Basophils Relative: 0 %
Eosinophils Absolute: 0 10*3/uL (ref 0.0–0.5)
Eosinophils Relative: 0 %
HCT: 44.5 % (ref 36.0–46.0)
Hemoglobin: 14.7 g/dL (ref 12.0–15.0)
Immature Granulocytes: 1 %
Lymphocytes Relative: 17 %
Lymphs Abs: 1 10*3/uL (ref 0.7–4.0)
MCH: 31.6 pg (ref 26.0–34.0)
MCHC: 33 g/dL (ref 30.0–36.0)
MCV: 95.7 fL (ref 80.0–100.0)
Monocytes Absolute: 0.5 10*3/uL (ref 0.1–1.0)
Monocytes Relative: 9 %
Neutro Abs: 4.2 10*3/uL (ref 1.7–7.7)
Neutrophils Relative %: 73 %
Platelets: 238 10*3/uL (ref 150–400)
RBC: 4.65 MIL/uL (ref 3.87–5.11)
RDW: 15.8 % — ABNORMAL HIGH (ref 11.5–15.5)
WBC: 5.7 10*3/uL (ref 4.0–10.5)
nRBC: 0 % (ref 0.0–0.2)

## 2019-05-22 LAB — COMPREHENSIVE METABOLIC PANEL
ALT: 23 U/L (ref 0–44)
AST: 16 U/L (ref 15–41)
Albumin: 4 g/dL (ref 3.5–5.0)
Alkaline Phosphatase: 68 U/L (ref 38–126)
Anion gap: 11 (ref 5–15)
BUN: 14 mg/dL (ref 8–23)
CO2: 24 mmol/L (ref 22–32)
Calcium: 9.3 mg/dL (ref 8.9–10.3)
Chloride: 99 mmol/L (ref 98–111)
Creatinine, Ser: 0.54 mg/dL (ref 0.44–1.00)
GFR calc Af Amer: >60 mL/min (ref >60)
GFR calc non Af Amer: >60 mL/min (ref >60)
Glucose, Bld: 148 mg/dL — ABNORMAL HIGH (ref 70–99)
Potassium: 3.9 mmol/L (ref 3.5–5.1)
Sodium: 134 mmol/L — ABNORMAL LOW (ref 135–145)
Total Bilirubin: 1 mg/dL (ref 0.3–1.2)
Total Protein: 7.7 g/dL (ref 6.5–8.1)

## 2019-05-22 LAB — LIPASE, BLOOD: Lipase: 17 U/L (ref 11–51)

## 2019-05-22 MED ORDER — SODIUM CHLORIDE 0.9 % IV BOLUS
1000.0000 mL | Freq: Once | INTRAVENOUS | Status: AC
  Administered 2019-05-22: 1000 mL via INTRAVENOUS

## 2019-05-22 MED ORDER — PROMETHAZINE HCL 25 MG RE SUPP: 25.0000 mg | Freq: Two times a day (BID) | RECTAL | 0 refills | Status: DC | PRN

## 2019-05-22 MED ORDER — PROCHLORPERAZINE EDISYLATE 10 MG/2ML IJ SOLN
10.0000 mg | Freq: Once | INTRAMUSCULAR | Status: AC
  Administered 2019-05-22: 10 mg via INTRAVENOUS
  Filled 2019-05-22: qty 2

## 2019-05-22 NOTE — ED Triage Notes (Signed)
Pt reports that she had chemo on Monday and has not been able to keep anything down since Thursday. Denies hematemesis. Denies diarrhea. Reports R sided mouth pain that she contacted her dentist for, but she has not been able to keep down the medication. Ambulatory and states that she feels pretty good other than the vomiting.

## 2019-05-22 NOTE — ED Provider Notes (Signed)
[facility] DEPT [facility] Note   CSN: [id number] Arrival date & time: 05/22/19  1908     History Chief Complaint  Patient presents with  . Emesis    Holly [name] is a 64 y.o. female.  Patient with symptoms that began after using pain medicine including tramadol and oxycodone.  Has been having some right upper mouth pain and her dentist called in those prescriptions.  She has an appointment Monday.  Denies any fever.  Has had 3 episodes of emesis.  Use Compazine this morning.  Denies any abdominal pain.  Having normal bowel movements.  Passing gas.  The history is provided by the patient.  Emesis Severity:  Moderate Duration:  2 days Timing:  Intermittent Progression:  Unchanged Chronicity:  New Relieved by:  Antiemetics Worsened by:  Nothing Associated symptoms: no abdominal pain, no arthralgias, no chills, no cough, no diarrhea, no fever, no headaches, no myalgias, no sore throat and no URI   Risk factors comment:  Hx of adenocarcinom of fallopian tube with recurrence, on chem therapy, last treatment 5 days ago      Past Medical History:  Diagnosis Date  . Complication of anesthesia    slow to wake up sometimes   . GERD (gastroesophageal reflux disease)   . History of chemotherapy   . History of hiatal hernia   . Hypertension   . Ovarian cancer ([place])   . PONV (postoperative nausea and vomiting)     Patient Active Problem List   Diagnosis Date Noted  . Anemia due to antineoplastic chemotherapy 05/17/2019  . Drug-induced skin rash 04/08/2019  . SBO (small bowel obstruction) ([place]) 02/27/2019  . Pancytopenia, acquired ([place]) 05/28/2017  . Essential hypertension 05/05/2017  . Peripheral neuropathy due to chemotherapy ([place]) 03/04/2017  . Hot flashes, menopausal 03/04/2017  . Genetic testing 01/31/2017  . Goals of care, counseling/discussion 01/21/2017  . Physical debility 01/03/2017  . 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 Ovarian cancer ([place])   . Pelvic mass in female   . Left ovarian epithelial cancer ([place])   . 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 Secondary malignant neoplasm of omentum [facility])     Past Surgical History:  Procedure Laterality Date  . DEBULKING N/A 12/26/2016   Procedure: RADICAL TUMOR DEBULKING; OMENTECTOMY;  Surgeon: Everitt Amber, MD;  Location: [facility] ORS;  Service: Gynecology;  Laterality: N/A;  . ganglion cyst removed    . Hital Hernia    . IR FLUORO GUIDE PORT INSERTION RIGHT  01/16/2017  . IR IMAGING GUIDED PORT INSERTION  03/16/2019  . IR REMOVAL TUN ACCESS W/ PORT W/O FL MOD SED  06/10/2017  . IR US GUIDE VASC ACCESS RIGHT  01/16/2017  . LAPAROTOMY N/A 12/26/2016   Procedure: EXPLORATORY LAPAROTOMY;  Surgeon: Everitt Amber, MD;  Location: [facility] ORS;  Service: Gynecology;  Laterality: N/A;  . left morton's neuroma surgery     . [place] Fundoplication       OB History   No obstetric history on file.     Family History  Problem Relation Age of Onset  . Diabetes Mother   . Diabetes Father   . Hypertension Father   . Stroke Father   . COPD Father   . Heart attack Sister 51  . Dementia Maternal Aunt   . Bone cancer Paternal Uncle        deceased at 41  . Dementia Maternal Grandmother   . Heart attack  Paternal Grandmother   . Heart attack Paternal Grandfather   . Healthy Son   . Healthy Son     Social History   Tobacco Use  . Smoking status: Never Smoker  . Smokeless tobacco: Never Used  Substance Use Topics  . Alcohol use: No  . Drug use: No    Home Medications Prior to Admission medications   Medication Sig Start Date End Date Taking? Authorizing Bobby Ragan  acetaminophen (TYLENOL) 325 MG tablet Take 975 mg by mouth every 6 (six) hours as needed (pain).    Zonie Crutcher, Historical, MD  lidocaine-prilocaine (EMLA) cream APP EXT AA 1 TIME 03/10/19   Leonard Feigel, Historical, MD  ondansetron (ZOFRAN) 8 MG tablet Take 1 tablet (8 mg total) by  mouth every 8 (eight) hours as needed. 03/10/19   Heath Lark, MD  prochlorperazine (COMPAZINE) 10 MG tablet Take 1 tablet (10 mg total) by mouth every 6 (six) hours as needed (Nausea or vomiting). 03/10/19   Heath Lark, MD  promethazine (PHENERGAN) 25 MG suppository Place 1 suppository (25 mg total) rectally 2 (two) times daily as needed for nausea or vomiting. 05/22/19   Curatolo, Adam, DO  venlafaxine XR (EFFEXOR-XR) 37.5 MG 24 hr capsule TAKE 1 CAPSULE BY MOUTH DAILY WITH BREAKFAST Patient taking differently: Take 37.5 mg by mouth daily with breakfast.  08/20/18   [name] D, NP    Allergies    Dilaudid [hydromorphone hcl], Morphine and related, and Sudafed [pseudoephedrine hcl]  Review of Systems   Review of Systems  Constitutional: Negative for chills and fever.  HENT: Negative for ear pain and sore throat.   Eyes: Negative for pain and visual disturbance.  Respiratory: Negative for cough and shortness of breath.   Cardiovascular: Negative for chest pain and palpitations.  Gastrointestinal: Positive for nausea and vomiting. Negative for abdominal pain and diarrhea.  Genitourinary: Negative for dysuria and hematuria.  Musculoskeletal: Negative for arthralgias, back pain and myalgias.  Skin: Negative for color change and rash.  Neurological: Negative for seizures, syncope and headaches.  All other systems reviewed and are negative.   Physical Exam Updated Vital Signs  ED Triage Vitals  Enc Vitals Group     BP 05/22/19 1912 (!) 183/88     Pulse Rate 05/22/19 1912 94     Resp 05/22/19 1912 18     Temp 05/22/19 1912 98.6 F (37 C)     Temp Source 05/22/19 1912 Oral     SpO2 05/22/19 1912 98 %     Weight --      Height --      Head Circumference --      Peak Flow --      Pain Score 05/22/19 1918 0     Pain Loc --      Pain Edu? --      Excl. in [place]? --      Physical Exam Vitals and nursing note reviewed.  Constitutional:      General: She is not in acute  distress.    Appearance: She is well-developed. She is not ill-appearing.  HENT:     Head: Normocephalic and atraumatic.     Nose: Nose normal.     Mouth/Throat:     Mouth: Mucous membranes are moist.  Eyes:     Conjunctiva/sclera: Conjunctivae normal.     Pupils: Pupils are equal, round, and reactive to light.  Cardiovascular:     Rate and Rhythm: Normal rate and regular rhythm.     Pulses:  Normal pulses.     Heart sounds: Normal heart sounds. No murmur.  Pulmonary:     Effort: Pulmonary effort is normal. No respiratory distress.     Breath sounds: Normal breath sounds.  Abdominal:     General: There is no distension.     Palpations: Abdomen is soft.     Tenderness: There is no abdominal tenderness.  Musculoskeletal:     Cervical back: Neck supple.  Skin:    General: Skin is warm and dry.     Capillary Refill: Capillary refill takes less than 2 seconds.  Neurological:     General: No focal deficit present.     Mental Status: She is alert.  Psychiatric:        Mood and Affect: Mood normal.     ED Results / Procedures / Treatments   Labs (all labs ordered are listed, but only abnormal results are displayed) Labs Reviewed  CBC WITH DIFFERENTIAL/PLATELET - Abnormal; Notable for the following components:      Result Value   RDW 15.8 (*)    All other components within normal limits  COMPREHENSIVE METABOLIC PANEL  LIPASE, BLOOD    EKG None  Radiology No results found.  Procedures Procedures (including critical care time)  Medications Ordered in ED Medications  sodium chloride 0.9 % bolus 1,000 mL (1,000 mLs Intravenous New Bag/Given 05/22/19 2013)  prochlorperazine (COMPAZINE) injection 10 mg (10 mg Intravenous Given 05/22/19 2014)    ED Course  I have reviewed the triage vital signs and the nursing notes.  Pertinent labs & imaging results that were available during my care of the patient were reviewed by me and considered in my medical decision making (see  chart for details).    MDM Rules/Calculators/A&P  MAYLASIA SHOBE is a 64 year old female with history of adenocarcinoma of the fallopian tube previously in remission now with recurrence and undergoing chemotherapy who presents to the ED with nausea and vomiting.  Patient with overall unremarkable vitals.  No fever.  Denies any abdominal pain.  Started to have some right upper mouth pain several days ago.  She was unable to get in to see her dentist.  However they called in narcotic pain medicine and then patient started to have some episodes of emesis.  She thinks that this may be attributed to her pain medication.  She did take Compazine this morning.  Denies any chest pain, shortness of breath.  No mouth ulcers.  No obvious dental process on exam.  No infection no mouth sores.  Patient with no abdominal tenderness.  She denies any constipation, symptoms of bowel obstruction.  Overall she is concerned about dehydration.  No concern for bowel obstruction or other intra-abdominal process.  Will evaluate with lab work, normal saline bolus, Compazine.  Will prescribe her Phenergan suppositories.  She has prescription for both Compazine and Zofran at home.  Patient has not had neutropenia in the past.  Does not have any fever.  No cough or other infectious symptoms.  No urinary symptoms.  No significant anemia, electrolyte abnormality, kidney injury. No concern for hepatobiliary process or pancreatitis. No concern for bowel obstruction. Repeat abdominal exam is unremarkable. Patient not neutropenic. Overall suspect nausea and vomiting secondary to pain medication. Recommend continued use of Tylenol. Has follow-up with dentist on Monday. No obvious signs of infection to the teeth. Given return precautions and discharged from the ED in good condition.  This chart was dictated using voice recognition software.  Despite best efforts to  proofread,  errors can occur which can change the documentation meaning.     Final Clinical Impression(s) / ED Diagnoses Final diagnoses:  Nausea and vomiting, intractability of vomiting not specified, unspecified vomiting type    Rx / DC Orders ED Discharge Orders         Ordered    promethazine (PHENERGAN) 25 MG suppository  2 times daily PRN     05/22/19 1940           Lennice Sites, DO 05/22/19 2141

## 2019-05-25 ENCOUNTER — Telehealth: Payer: Self-pay | Admitting: *Deleted

## 2019-05-25 NOTE — Telephone Encounter (Signed)
Patient called to notify Dr. Alvy Bimler she was in the ED this weekend with tooth pain. She has a consultation set up today at 3pm for 2 teeth to be extracted. Advised patient the oral surgeon would need to contact our office for the extraction plan. She is currently on antibiotics and hydrocodone for the pain. She verbalized an understanding to make sure the dentist and oral surgeon know her current medications and her active chemo treatment.

## 2019-05-27 ENCOUNTER — Telehealth: Payer: Self-pay

## 2019-05-27 NOTE — Telephone Encounter (Signed)
Talked to pt by phone regarding need for dental work. She states pain is severe - she is alternating tylenol and ibuprofen every 3 hours.  She would like to have dental work done sooner than 2/18. I have scheduled her for an 0815 lab appt tomorrow morning and put in an order for a CBC.

## 2019-05-27 NOTE — Telephone Encounter (Signed)
I called her back and changed her appts to lab at 1215 and see you at 1300

## 2019-05-27 NOTE — Telephone Encounter (Signed)
I should probably see her as well My morning is overbooked so I have to see her in the afternoon I can prescribe something stronger for pain when I see her

## 2019-05-28 ENCOUNTER — Encounter: Payer: Self-pay | Admitting: Hematology and Oncology

## 2019-05-28 ENCOUNTER — Inpatient Hospital Stay (HOSPITAL_BASED_OUTPATIENT_CLINIC_OR_DEPARTMENT_OTHER): Payer: BC Managed Care – PPO | Admitting: Hematology and Oncology

## 2019-05-28 ENCOUNTER — Inpatient Hospital Stay: Payer: BC Managed Care – PPO | Attending: Hematology and Oncology

## 2019-05-28 ENCOUNTER — Other Ambulatory Visit: Payer: BC Managed Care – PPO

## 2019-05-28 ENCOUNTER — Other Ambulatory Visit: Payer: Self-pay

## 2019-05-28 ENCOUNTER — Inpatient Hospital Stay: Payer: BC Managed Care – PPO

## 2019-05-28 DIAGNOSIS — T451X5A Adverse effect of antineoplastic and immunosuppressive drugs, initial encounter: Secondary | ICD-10-CM | POA: Insufficient documentation

## 2019-05-28 DIAGNOSIS — R112 Nausea with vomiting, unspecified: Secondary | ICD-10-CM

## 2019-05-28 DIAGNOSIS — K047 Periapical abscess without sinus: Secondary | ICD-10-CM | POA: Insufficient documentation

## 2019-05-28 DIAGNOSIS — K1231 Oral mucositis (ulcerative) due to antineoplastic therapy: Secondary | ICD-10-CM | POA: Diagnosis not present

## 2019-05-28 DIAGNOSIS — Z7189 Other specified counseling: Secondary | ICD-10-CM

## 2019-05-28 DIAGNOSIS — C5701 Malignant neoplasm of right fallopian tube: Secondary | ICD-10-CM | POA: Diagnosis not present

## 2019-05-28 DIAGNOSIS — L821 Other seborrheic keratosis: Secondary | ICD-10-CM | POA: Insufficient documentation

## 2019-05-28 LAB — CMP (CANCER CENTER ONLY)
ALT: 18 U/L (ref 0–44)
AST: 14 U/L — ABNORMAL LOW (ref 15–41)
Albumin: 3.6 g/dL (ref 3.5–5.0)
Alkaline Phosphatase: 73 U/L (ref 38–126)
Anion gap: 9 (ref 5–15)
BUN: 14 mg/dL (ref 8–23)
CO2: 25 mmol/L (ref 22–32)
Calcium: 8.8 mg/dL — ABNORMAL LOW (ref 8.9–10.3)
Chloride: 107 mmol/L (ref 98–111)
Creatinine: 0.82 mg/dL (ref 0.44–1.00)
GFR, Est AFR Am: >60 mL/min (ref >60)
GFR, Estimated: >60 mL/min (ref >60)
Glucose, Bld: 93 mg/dL (ref 70–99)
Potassium: 4.3 mmol/L (ref 3.5–5.1)
Sodium: 141 mmol/L (ref 135–145)
Total Bilirubin: 0.3 mg/dL (ref 0.3–1.2)
Total Protein: 6.7 g/dL (ref 6.5–8.1)

## 2019-05-28 LAB — CBC WITH DIFFERENTIAL (CANCER CENTER ONLY)
Abs Immature Granulocytes: 0.01 10*3/uL (ref 0.00–0.07)
Basophils Absolute: 0 10*3/uL (ref 0.0–0.1)
Basophils Relative: 0 %
Eosinophils Absolute: 0 10*3/uL (ref 0.0–0.5)
Eosinophils Relative: 1 %
HCT: 27.7 % — ABNORMAL LOW (ref 36.0–46.0)
Hemoglobin: 9.5 g/dL — ABNORMAL LOW (ref 12.0–15.0)
Immature Granulocytes: 0 %
Lymphocytes Relative: 44 %
Lymphs Abs: 1.6 10*3/uL (ref 0.7–4.0)
MCH: 32.4 pg (ref 26.0–34.0)
MCHC: 34.3 g/dL (ref 30.0–36.0)
MCV: 94.5 fL (ref 80.0–100.0)
Monocytes Absolute: 0.2 10*3/uL (ref 0.1–1.0)
Monocytes Relative: 6 %
Neutro Abs: 1.8 10*3/uL (ref 1.7–7.7)
Neutrophils Relative %: 49 %
Platelet Count: 154 10*3/uL (ref 150–400)
RBC: 2.93 MIL/uL — ABNORMAL LOW (ref 3.87–5.11)
RDW: 14.6 % (ref 11.5–15.5)
WBC Count: 3.7 10*3/uL — ABNORMAL LOW (ref 4.0–10.5)
nRBC: 0 % (ref 0.0–0.2)

## 2019-05-28 MED ORDER — HEPARIN SOD (PORK) LOCK FLUSH 100 UNIT/ML IV SOLN
500.0000 [IU] | Freq: Once | INTRAVENOUS | Status: AC
  Administered 2019-05-28: 500 [IU]
  Filled 2019-05-28: qty 5

## 2019-05-28 MED ORDER — SODIUM CHLORIDE 0.9% FLUSH
10.0000 mL | Freq: Once | INTRAVENOUS | Status: AC
  Administered 2019-05-28: 10 mL
  Filled 2019-05-28: qty 10

## 2019-05-28 NOTE — Assessment & Plan Note (Signed)
Her recent nausea and vomiting were unrelated to chemotherapy She is fearful of taking pain medicine I recommend she takes antiemetics before she takes her pain medicine if needed

## 2019-05-28 NOTE — Patient Instructions (Signed)

## 2019-05-28 NOTE — Progress Notes (Signed)
[facility] OFFICE PROGRESS NOTE  Patient Care Team: Gaynelle Arabian, MD as PCP - General (Family Medicine) Heath Lark, MD as Consulting Physician (Hematology and Oncology) Everitt Amber, MD as Consulting Physician (Obstetrics and Gynecology)  ASSESSMENT & PLAN:  Dental infection She had recent severe pain due to dental infection After prescription of amoxicillin and some pain medicine, her pain appears to be tolerable today Overall, she does not appear to look toxic with no signs of abscess on her maxilla or jaw Due to her recent exposure to chemotherapy, I recommend waiting minimum 2 weeks or more from her most recent surgery to allow blood count recovery and reduce the risk of complication from infection After much discussion, we are in agreement for her to proceed with dental extraction around the end of second week, beginning of third week of February and that would allow her also adequate time to heal before her next cycle of treatment In the meantime, she will continue antibiotics treatment and pain management  Adenocarcinoma of right fallopian tube [facility]) Due to her recent chemotherapy, I explained to the patient and her husband the rationale of delaying surgery if possible until at least 2-1/2 weeks away from her recent chemotherapy Continue supportive care  Nausea with vomiting Her recent nausea and vomiting were unrelated to chemotherapy She is fearful of taking pain medicine I recommend she takes antiemetics before she takes her pain medicine if needed   No orders of the defined types were placed in this encounter.   All questions were answered. The patient knows to call the clinic with any problems, questions or concerns. The total time spent in the appointment was 20 minutes encounter with patients including review of chart and various tests results, discussions about plan of care and coordination of care plan   Heath Lark, MD 05/28/2019 2:13 PM  INTERVAL  HISTORY: Please see below for problem oriented charting. She returns with her husband for further follow-up Last week, she developed acute tooth pain at the upper portion on the right side of her maxilla Her pain was so severe that she was unable to eat or drink She saw her dentist and was prescribed antibiotics treatment along with some steroids She did not eat much and become dehydrated and then started throwing up when she took some pain medicine She ended up in the emergency room requiring IV fluids and IV Compazine She was subsequently discharged She felt better this week Denies fever or chills She is able to eat and drink She is avoiding pain medicine because she is fearful she might throw up Her jaw pain has dramatically improved She is inquiring about the timing of her dental extraction and her dentist require dental clearance before she proceed  SUMMARY OF ONCOLOGIC HISTORY: Oncology History Overview Note  Neg genetics   Adenocarcinoma of right fallopian tube ([place])  08/12/2016 Initial Diagnosis   The patient has many months of vague symptoms of fullness in the pelvis, urinary frequency and difficulty with defecation. She denies vaginal bleeding or rectal bleeding.    08/12/2016 Imaging   Abdomen X-ray in the ER: Moderate colonic stool burden without evidence of enteric obstruction   11/13/2016 Imaging   TVUS was performed on 11/13/16 which showed a large hypoechoic lobular mass seen posterior to the uterus measuring 18.2x16.1x11.8cm, blood flow was seen along the periphery of the mass. It was considered to be likely to be a fibroid vs pelvic mass vs ovarian mass. Neither ovary was removed. Moderate amount  of free fluid in the pelvis. THe uterus measures 4x10x4cm. The endometrium is 3m.    12/05/2016 Imaging   MR pelvis 1. Mild limitations as detailed above. 2. Heterogeneous pelvic mass is favored to arise from the right ovary. Favor a solid ovarian neoplasm such as  fibroma/Brenner's tumor. Given lesion size and heterogeneous T2 signal out, complicating torsion cannot be excluded. 3. Moderate abdominopelvic ascites, without specific evidence of peritoneal metastasis. Consider further evaluation with contrast-enhanced abdominopelvic CT.    12/26/2016 Pathology Results   1. Uterus, ovaries and fallopian tubes - ADENOCARCINOMA INVOLVING RIGHT FALLOPIAN TUBE, RIGHT AND LEFT OVARIES, UTERINE SEROSA AND PELVIC MASS. - SEE ONCOLOGY TABLE AND COMMENT. - UTERINE CERVIX, ENDOMETRIUM, MYOMETRIUM AND LEFT FALLOPIAN TUBE FREE OF TUMOR. 2. Omentum, resection for tumor - ADENOCARCINOMA. Microscopic Comment 1. ONCOLOGY TABLE - FALLOPIAN TUBE. SEE COMMENT 1. Specimen, including laterality: Uterus, bilateral adnexa, pelvic mass and omentum. 2. Procedure: Hysterectomy with bilateral salpingo-oophorectomy, pelvic mass excision and omentectomy. 3. Lymph node sampling performed: No 4. Tumor site: See comment. 5. Tumor location in fallopian tube: Right fallopian tube fimbria 6. Specimen integrity (intact/ruptured/disrupted): Intact 7. Tumor size (cm): 18 cm, see comment. 8. Histologic type: Adenocarcinoma 9. Grade: High grade 10. Microscopic tumor extension: Tumor involves right fallopian tube, right and left ovaries, uterine serosa, pelvic mass and omentum. 11. Margins: See comment. 12. Lymph-Vascular invasion: Present 13. Lymph nodes: # examined: 0; # positive: N/A 14. TNM: pT3c, pNX 15. FIGO Stage (based on pathologic findings, needs clinical correlation: III-C 16. Comments: There is an 18 cm pelvic mass which is a high grade adenocarcinoma and there is a 12.2 cm  segment of omentum which is extensively involved with adenocarcinoma. The tumor also involves the fimbria of the right fallopian tube and the parenchyma of the right and left ovaries as well as uterine serosa. The fimbria of the right fallopian has intraepithelial atypia consistent with a precursor lesion and  therefore this is most consistent with primary fallopian tube adenocarcinoma. A primary peritoneal serous carcinoma is a less likely possibility.    12/26/2016 Pathology Results   PERITONEAL/ASCITIC FLUID(SPECIMEN 1 OF 1 COLLECTED 12/26/16): POORLY DIFFERENTIATED ADENOCARCINOMA   12/26/2016 Surgery   Procedure(s) Performed: Exploratory laparotomy with total abdominal hysterectomy, bilateral salpingo-oophorectomy, omentectomy radical tumor debulking for ovarian cancer .  Surgeon: EThereasa Solo MD.   Operative Findings: 20cm left ovarian mass densely adherent to the posterior uterus, right tube and ovary, cervix, sigmoid colon. 10cm omental cake. 4L ascites. 15msize tumor nodules on the serosa of the terminal ileum and proximal sigmoid colon. 5m60modules on right diaphragm.    This represented an optimal cytoreduction (R1) with 5mm74mdules on intestine and diaphragm representing gross visible disease   01/16/2017 Procedure   Placement of single lumen port a cath via right internal jugular vein. The catheter tip lies at the cavo-atrial junction. A power injectable port a cath was placed and is ready for immediate use.   01/17/2017 Imaging   1. 3.5 x 7.2 x 4.6 cm fluid collection along the vaginal cuff, posterior the bladder and extending into the right adnexal space. This lesion demonstrates rim enhancement. Imaging features could be related to a loculated postoperative seroma or hematoma. Superinfection cannot be excluded by CT. Given debulking surgery was 3 weeks ago, this entire structure is un likely to represent neoplasm, but peritoneal involvement could have this appearance. 2. Small fluid collection in the left para colic gutter without rim enhancement. 3. Irregular/nodular appearance of the peritoneal  M in the anatomic pelvis with areas of subtle nodularity between the stomach and the spleen. Close attention in these regions on follow-up recommended as metastatic disease is a concern. 4.  Mildly enlarged hepatoduodenal ligament lymph node. Attention on follow-up recommended.   01/20/2017 Tumor Marker   Patient's tumor was tested for the following markers: CA-125 Results of the tumor marker test revealed 46.6   01/28/2017 Genetic Testing       Negative genetic testing on the [facility] panel.  The [facility] gene panel offered by [employer] includes sequencing and deletion/duplication testing of the following 28 genes: APC, ATM, BARD1, BMPR1A, BRCA1, BRCA2, BRIP1, CHD1, CDK4, CDKN2A, CHEK2, EPCAM (large rearrangement only), MLH1, MSH2, MSH6, MUTYH, NBN, PALB2, PMS2, PTEN, RAD51C, RAD51D, SMAD4, STK11, and TP53. Sequencing was performed for select regions of POLE and POLD1, and large rearrangement analysis was performed for select regions of GREM1. The report date is January 28, 2017.  HRD testing looking for genomic instability and BRCA mutations was negative.  The report date of this test is January 27, 2017.    01/31/2017 Tumor Marker   Patient's tumor was tested for the following markers: CA-125 Results of the tumor marker test revealed 22.4   03/04/2017 Tumor Marker   Patient's tumor was tested for the following markers: CA-125 Results of the tumor marker test revealed 13.8   04/18/2017 Tumor Marker   Patient's tumor was tested for the following markers: CA-125 Results of the tumor marker test revealed 11.9   05/05/2017 Tumor Marker   Patient's tumor was tested for the following markers: CA-125 Results of the tumor marker test revealed 12   05/26/2017 Tumor Marker   Patient's tumor was tested for the following markers: CA-125 Results of the tumor marker test revealed 10.6   05/26/2017 Imaging   1. A previously noted postoperative fluid collection in the low pelvis and residual ascites seen on the prior examination has resolved on today's study. No findings to suggest residual/recurrent disease on today's examination. No definite solid organ metastasis  identified in the abdomen or pelvis. No lymphadenopathy. 2. Aortic atherosclerosis.   06/10/2017 Procedure   Successful right IJ vein Port-A-Cath explant.   03/09/2018 Tumor Marker   Patient's tumor was tested for the following markers: CA-125 Results of the tumor marker test revealed 12.1   05/26/2018 Tumor Marker   Patient's tumor was tested for the following markers: CA-125 Results of the tumor marker test revealed 13.8   09/09/2018 Tumor Marker   Patient's tumor was tested for the following markers: CA-125 Results of the tumor marker test revealed 23.9   12/18/2018 Tumor Marker   Patient's tumor was tested for the following markers: CA-125 Results of the tumor marker test revealed 43.8   02/27/2019 - 02/28/2019 Hospital Admission   She was admitted for bowel obstruction   02/27/2019 Imaging   1. High-grade small bowel obstruction with transition point in the pelvis in an area of suspected desmoplastic reaction surrounding a 1.3 cm spiculated nodule in the mesentery, suspicious for carcinoid. There is hyperenhancement near the tip of the appendix and loss of the normal fat plane between it and the adjacent sigmoid colon, potentially the site of primary lesion. 2. Multiple new small subcentimeter hyperdense lesions scattered along the liver capsule, concerning for metastases. Enlarged hyperenhancing gastrohepatic and portacaval lymph nodes concerning for nodal metastases. 3. Very mild right hydroureteronephrosis to the level of the desmoplastic reaction in the pelvis, potentially involving the right ureter. 4. Infraumbilical ventral abdominal  wall diastasis containing a small nondilated portion of transverse colon. 5. Trace ascites. 6. Cholelithiasis. 7.  Aortic atherosclerosis (ICD10-I70.0).     03/05/2019 Tumor Marker   Patient's tumor was tested for the following markers: CA-125 Results of the tumor marker test revealed 70.1   03/12/2019 Echocardiogram   IMPRESSIONS     1. Left  ventricular ejection fraction, by visual estimation, is 60 to 65%. The left ventricle has normal function. There is no left ventricular hypertrophy.  2. Left ventricular diastolic parameters are consistent with Grade I diastolic dysfunction (impaired relaxation).  3. Global right ventricle has normal systolic function.The right ventricular size is normal. No increase in right ventricular wall thickness.  4. Left atrial size was normal.  5. Right atrial size was normal.  6. The pericardium was not assessed.  7. The mitral valve is normal in structure. No evidence of mitral valve regurgitation.  8. The tricuspid valve is normal in structure. Tricuspid valve regurgitation is trivial.  9. The aortic valve is normal in structure. Aortic valve regurgitation is not visualized. 10. The pulmonic valve was grossly normal. Pulmonic valve regurgitation is not visualized. 11. The average left ventricular global longitudinal strain is -17.6 %.   03/16/2019 Procedure   Successful placement of a right internal jugular approach power injectable Port-A-Cath. The catheter is ready for immediate use.     04/19/2019 Tumor Marker   Patient's tumor was tested for the following markers: CA-125 Results of the tumor marker test revealed 39.8   05/17/2019 Tumor Marker   Patient's tumor was tested for the following markers: CA-125 Results of the tumor marker test revealed 27     REVIEW OF SYSTEMS:   Constitutional: Denies fevers, chills or abnormal weight loss Eyes: Denies blurriness of vision Ears, nose, mouth, throat, and face: Denies mucositis or sore throat Respiratory: Denies cough, dyspnea or wheezes Cardiovascular: Denies palpitation, chest discomfort or lower extremity swelling Skin: Denies abnormal skin rashes Lymphatics: Denies new lymphadenopathy or easy bruising Neurological:Denies numbness, tingling or new weaknesses Behavioral/Psych: Mood is stable, no new changes  All other systems were  reviewed with the patient and are negative.  I have reviewed the past medical history, past surgical history, social history and family history with the patient and they are unchanged from previous note.  ALLERGIES:  is allergic to dilaudid [hydromorphone hcl]; morphine and related; and sudafed [pseudoephedrine hcl].  MEDICATIONS:  Current Outpatient Medications  Medication Sig Dispense Refill  . amoxicillin (AMOXIL) 500 MG capsule Take 500 mg by mouth 3 (three) times daily.    Marland Kitchen HYDROcodone-acetaminophen (NORCO/VICODIN) 5-325 MG tablet Take 1 tablet by mouth every 6 (six) hours as needed.    . methylPREDNISolone (MEDROL DOSEPAK) 4 MG TBPK tablet Take 4 mg by mouth See admin instructions. follow package directions    . traMADol (ULTRAM) 50 MG tablet Take 50 mg by mouth 3 (three) times daily as needed.    Marland Kitchen acetaminophen (TYLENOL) 325 MG tablet Take 975 mg by mouth every 6 (six) hours as needed (pain).    Marland Kitchen lidocaine-prilocaine (EMLA) cream APP EXT AA 1 TIME    . ondansetron (ZOFRAN) 8 MG tablet Take 1 tablet (8 mg total) by mouth every 8 (eight) hours as needed. 30 tablet 1  . prochlorperazine (COMPAZINE) 10 MG tablet Take 1 tablet (10 mg total) by mouth every 6 (six) hours as needed (Nausea or vomiting). 30 tablet 1  . promethazine (PHENERGAN) 25 MG suppository Place 1 suppository (25 mg total) rectally 2 (two)  times daily as needed for nausea or vomiting. 12 each 0  . venlafaxine XR (EFFEXOR-XR) 37.5 MG 24 hr capsule TAKE 1 CAPSULE BY MOUTH DAILY WITH BREAKFAST (Patient taking differently: Take 37.5 mg by mouth daily with breakfast. ) 90 capsule 6   No current facility-administered medications for this visit.   Facility-Administered Medications Ordered in Other Visits  Medication Dose Route Frequency Provider Last Rate Last Admin  . sodium chloride flush (NS) 0.9 % injection 10 mL  10 mL Intravenous PRN Alvy Bimler, Heavyn Yearsley, MD   10 mL at 02/11/17 0839  . sodium chloride flush (NS) 0.9 % injection  10 mL  10 mL Intravenous PRN Heath Lark, MD   10 mL at 03/04/17 1510    PHYSICAL EXAMINATION: ECOG PERFORMANCE STATUS: 1 - Symptomatic but completely ambulatory  Vitals:   05/28/19 1300  BP: 134/66  Pulse: 66  Resp: 18  Temp: 97.8 F (36.6 C)  SpO2: 100%   Filed Weights   05/28/19 1300  Weight: 174 lb 8 oz (79.2 kg)    GENERAL:alert, no distress and comfortable NEURO: alert & oriented x 3 with fluent speech, no focal motor/sensory deficits  LABORATORY DATA:  I have reviewed the data as listed    Component Value Date/Time   NA 141 05/28/2019 1242   NA 139 04/14/2017 0742   K 4.3 05/28/2019 1242   K 4.0 04/14/2017 0742   CL 107 05/28/2019 1242   CO2 25 05/28/2019 1242   CO2 21 (L) 04/14/2017 0742   GLUCOSE 93 05/28/2019 1242   GLUCOSE 247 (H) 04/14/2017 0742   BUN 14 05/28/2019 1242   BUN 13.1 04/14/2017 0742   CREATININE 0.82 05/28/2019 1242   CREATININE 0.9 04/14/2017 0742   CALCIUM 8.8 (L) 05/28/2019 1242   CALCIUM 9.2 04/14/2017 0742   PROT 6.7 05/28/2019 1242   PROT 7.3 04/14/2017 0742   ALBUMIN 3.6 05/28/2019 1242   ALBUMIN 3.9 04/14/2017 0742   AST 14 (L) 05/28/2019 1242   AST 17 04/14/2017 0742   ALT 18 05/28/2019 1242   ALT 14 04/14/2017 0742   ALKPHOS 73 05/28/2019 1242   ALKPHOS 70 04/14/2017 0742   BILITOT 0.3 05/28/2019 1242   BILITOT 0.37 04/14/2017 0742   GFRNONAA >60 05/28/2019 1242   GFRAA >60 05/28/2019 1242    No results found for: SPEP, UPEP  Lab Results  Component Value Date   WBC 3.7 (L) 05/28/2019   NEUTROABS 1.8 05/28/2019   HGB 9.5 (L) 05/28/2019   HCT 27.7 (L) 05/28/2019   MCV 94.5 05/28/2019   PLT 154 05/28/2019      Chemistry      Component Value Date/Time   NA 141 05/28/2019 1242   NA 139 04/14/2017 0742   K 4.3 05/28/2019 1242   K 4.0 04/14/2017 0742   CL 107 05/28/2019 1242   CO2 25 05/28/2019 1242   CO2 21 (L) 04/14/2017 0742   BUN 14 05/28/2019 1242   BUN 13.1 04/14/2017 0742   CREATININE 0.82 05/28/2019  1242   CREATININE 0.9 04/14/2017 0742      Component Value Date/Time   CALCIUM 8.8 (L) 05/28/2019 1242   CALCIUM 9.2 04/14/2017 0742   ALKPHOS 73 05/28/2019 1242   ALKPHOS 70 04/14/2017 0742   AST 14 (L) 05/28/2019 1242   AST 17 04/14/2017 0742   ALT 18 05/28/2019 1242   ALT 14 04/14/2017 0742   BILITOT 0.3 05/28/2019 1242   BILITOT 0.37 04/14/2017 0742

## 2019-05-28 NOTE — Assessment & Plan Note (Signed)
Due to her recent chemotherapy, I explained to the patient and her husband the rationale of delaying surgery if possible until at least 2-1/2 weeks away from her recent chemotherapy Continue supportive care

## 2019-05-28 NOTE — Assessment & Plan Note (Signed)
She had recent severe pain due to dental infection After prescription of amoxicillin and some pain medicine, her pain appears to be tolerable today Overall, she does not appear to look toxic with no signs of abscess on her maxilla or jaw Due to her recent exposure to chemotherapy, I recommend waiting minimum 2 weeks or more from her most recent surgery to allow blood count recovery and reduce the risk of complication from infection After much discussion, we are in agreement for her to proceed with dental extraction around the end of second week, beginning of third week of February and that would allow her also adequate time to heal before her next cycle of treatment In the meantime, she will continue antibiotics treatment and pain management

## 2019-05-31 ENCOUNTER — Telehealth: Payer: Self-pay

## 2019-05-31 NOTE — Telephone Encounter (Signed)
She called and left a message to call her.  Called back. Requesting CA-125 results. Results have not resulted. She has enough antibiotics to last until tomorrow for her dental issue. The oral surgeon's next appt available is 2/23 and chemo is scheduled 2/22. She is not sure what to do? She does not want to miss chemo on 2/22.

## 2019-06-01 ENCOUNTER — Telehealth: Payer: Self-pay

## 2019-06-01 LAB — CA 125: Cancer Antigen (CA) 125: 27.7 U/mL (ref 0.0–38.1)

## 2019-06-01 NOTE — Telephone Encounter (Signed)
Called and given below message. She verbalized understanding. She would like to keep scan as scheduled 2/19 and schedule chemo for 3/1.

## 2019-06-01 NOTE — Telephone Encounter (Signed)
Faxed to treatment recommendations form to [phone]. Received confirmation.

## 2019-06-01 NOTE — Telephone Encounter (Signed)
CA-125 is stable I suggest she keep the dental appt and we can move her chemo until March 1st Please tell her that the dental infection can cause life threatening infection if not removed and delaying chemo by 1 week will not make a difference

## 2019-06-03 ENCOUNTER — Telehealth: Payer: Self-pay

## 2019-06-03 MED FILL — CMPD MMW LID:NYS:DPH:MAX: 8 days supply | Qty: 160 | Fill #0

## 2019-06-03 NOTE — Telephone Encounter (Signed)
Rx called into [facility] outpatient pharmacy.  Pt notified.

## 2019-06-03 NOTE — Telephone Encounter (Signed)
Can you call in magic mouth wash swish and spit 4 x per day, nystatin/maalox/benadryl/lidocaine mix at 1:1:1:1 ratio, 5 ml per dose for 7 days supply, no refills? Since it has to be compounded, I suggest [facility] outpatient pharmacy for this

## 2019-06-03 NOTE — Telephone Encounter (Signed)
Pt called to report mouth sores X 2 days.  Denies any fever, able to tolerate fluids and food without difficulty.    Pt reports recently going to dentist and having impressions made, since then has been aggravated.  Pt is also s/p D1C3 Doxil and Carboplatin.    Pt has been using orajel and mouth rinses with minimal effectiveness.    Will forward to MD for further review.

## 2019-06-04 ENCOUNTER — Telehealth: Payer: Self-pay | Admitting: Hematology and Oncology

## 2019-06-04 NOTE — Telephone Encounter (Signed)
Scheduled per 2/9 sch msg. Called and left a msg. Mailing printout 

## 2019-06-11 ENCOUNTER — Other Ambulatory Visit: Payer: Self-pay

## 2019-06-11 ENCOUNTER — Encounter (HOSPITAL_COMMUNITY): Payer: Self-pay

## 2019-06-11 ENCOUNTER — Ambulatory Visit (HOSPITAL_COMMUNITY)
Admission: RE | Admit: 2019-06-11 | Discharge: 2019-06-11 | Disposition: A | Discharge status: Home / Self Care | Payer: BC Managed Care – PPO | Source: Ambulatory Visit | Attending: Hematology and Oncology | Admitting: Hematology and Oncology

## 2019-06-11 DIAGNOSIS — C569 Malignant neoplasm of unspecified ovary: Secondary | ICD-10-CM | POA: Diagnosis not present

## 2019-06-11 DIAGNOSIS — C5701 Malignant neoplasm of right fallopian tube: Secondary | ICD-10-CM | POA: Insufficient documentation

## 2019-06-11 MED ORDER — IOHEXOL 300 MG/ML  SOLN
100.0000 mL | Freq: Once | INTRAMUSCULAR | Status: AC | PRN
  Administered 2019-06-11: 100 mL via INTRAVENOUS

## 2019-06-11 MED ORDER — HEPARIN SOD (PORK) LOCK FLUSH 100 UNIT/ML IV SOLN
500.0000 [IU] | Freq: Once | INTRAVENOUS | Status: AC
  Administered 2019-06-11: 09:00:00 500 [IU] via INTRAVENOUS

## 2019-06-11 MED ORDER — HEPARIN SOD (PORK) LOCK FLUSH 100 UNIT/ML IV SOLN
INTRAVENOUS | Status: AC
  Filled 2019-06-11: qty 5

## 2019-06-11 MED ORDER — SODIUM CHLORIDE (PF) 0.9 % IJ SOLN
INTRAMUSCULAR | Status: AC
  Filled 2019-06-11: qty 50

## 2019-06-14 ENCOUNTER — Telehealth: Payer: Self-pay

## 2019-06-14 ENCOUNTER — Inpatient Hospital Stay (HOSPITAL_BASED_OUTPATIENT_CLINIC_OR_DEPARTMENT_OTHER): Payer: BC Managed Care – PPO | Admitting: Hematology and Oncology

## 2019-06-14 ENCOUNTER — Other Ambulatory Visit: Payer: Self-pay

## 2019-06-14 ENCOUNTER — Other Ambulatory Visit: Payer: BC Managed Care – PPO

## 2019-06-14 ENCOUNTER — Ambulatory Visit: Payer: BC Managed Care – PPO

## 2019-06-14 ENCOUNTER — Encounter: Payer: Self-pay | Admitting: Hematology and Oncology

## 2019-06-14 VITALS — BP 115/55 | HR 82 | Temp 98.0°F | Resp 18 | Ht 65.0 in | Wt 176.0 lb

## 2019-06-14 DIAGNOSIS — C5701 Malignant neoplasm of right fallopian tube: Secondary | ICD-10-CM

## 2019-06-14 DIAGNOSIS — K1231 Oral mucositis (ulcerative) due to antineoplastic therapy: Secondary | ICD-10-CM | POA: Insufficient documentation

## 2019-06-14 DIAGNOSIS — Z5111 Encounter for antineoplastic chemotherapy: Secondary | ICD-10-CM

## 2019-06-14 DIAGNOSIS — T451X5A Adverse effect of antineoplastic and immunosuppressive drugs, initial encounter: Secondary | ICD-10-CM | POA: Diagnosis not present

## 2019-06-14 DIAGNOSIS — L821 Other seborrheic keratosis: Secondary | ICD-10-CM | POA: Diagnosis not present

## 2019-06-14 DIAGNOSIS — K047 Periapical abscess without sinus: Secondary | ICD-10-CM | POA: Diagnosis not present

## 2019-06-14 MED ORDER — MAGIC MOUTHWASH W/LIDOCAINE: 5.0000 mL | Freq: Four times a day (QID) | ORAL | 0 refills | Status: DC

## 2019-06-14 MED FILL — CMPD MMW LID:NYS:DPH:MAX: 7 days supply | Qty: 140 | Fill #0

## 2019-06-14 NOTE — Assessment & Plan Note (Signed)
I have reviewed multiple imaging studies and blood test result with the patient and her husband She have excellent response to treatment I recommend 3 more cycles of treatment before repeating another imaging study I will order surveillance echocardiogram this week to monitor her heart function So far, she tolerated treatment well except for very mild mucositis and skin rash that seems to flareup with treatment

## 2019-06-14 NOTE — Progress Notes (Signed)
[place] OFFICE PROGRESS NOTE  Patient Care Team: Gaynelle Arabian, MD as PCP - General (Family Medicine) Heath Lark, MD as Consulting Physician (Hematology and Oncology) Everitt Amber, MD as Consulting Physician (Obstetrics and Gynecology)  ASSESSMENT & PLAN:  Adenocarcinoma of right fallopian tube [facility]) I have reviewed multiple imaging studies and blood test result with the patient and her husband She have excellent response to treatment I recommend 3 more cycles of treatment before repeating another imaging study I will order surveillance echocardiogram this week to monitor her heart function So far, she tolerated treatment well except for very mild mucositis and skin rash that seems to flareup with treatment  Dental infection She is scheduled for dental extraction tomorrow There is no contraindication for her to proceed  Mucositis due to antineoplastic therapy I recommend her to use Magic mouthwash as needed  Seborrheic keratoses She has intermittent flare of support keratosis Recommend conservative management only   Orders Placed This Encounter  Procedures  . ECHOCARDIOGRAM COMPLETE    Standing Status:   Future    Standing Expiration Date:   09/10/2020    Order Specific Question:   Where should this test be performed    Answer:   [place]    Order Specific Question:   Perflutren DEFINITY (image enhancing agent) should be administered unless hypersensitivity or allergy exist    Answer:   Administer Perflutren    Order Specific Question:   Reason for exam-Echo    Answer:   Chemotherapy evaluation  v87.41 / v58.11    All questions were answered. The patient knows to call the clinic with any problems, questions or concerns. The total time spent in the appointment was 20 minutes encounter with patients including review of chart and various tests results, discussions about plan of care and coordination of care plan   Heath Lark, MD 06/14/2019 3:21  PM  INTERVAL HISTORY: Please see below for problem oriented charting. She returns with her husband to review test results She is scheduled for dental extraction tomorrow She have occasional flare of her seborrheic keratosis on her chest wall that comes and goes She had intermittent mucositis that comes and goes Otherwise, she tolerated treatment well No recent signs or symptoms of congestive heart failure  SUMMARY OF ONCOLOGIC HISTORY: Oncology History Overview Note  Neg genetics   Adenocarcinoma of right fallopian tube ([place])  08/12/2016 Initial Diagnosis   The patient has many months of vague symptoms of fullness in the pelvis, urinary frequency and difficulty with defecation. She denies vaginal bleeding or rectal bleeding.    08/12/2016 Imaging   Abdomen X-ray in the ER: Moderate colonic stool burden without evidence of enteric obstruction   11/13/2016 Imaging   TVUS was performed on 11/13/16 which showed a large hypoechoic lobular mass seen posterior to the uterus measuring 18.2x16.1x11.8cm, blood flow was seen along the periphery of the mass. It was considered to be likely to be a fibroid vs pelvic mass vs ovarian mass. Neither ovary was removed. Moderate amount of free fluid in the pelvis. THe uterus measures 4x10x4cm. The endometrium is 1m.    12/05/2016 Imaging   MR pelvis 1. Mild limitations as detailed above. 2. Heterogeneous pelvic mass is favored to arise from the right ovary. Favor a solid ovarian neoplasm such as fibroma/Brenner's tumor. Given lesion size and heterogeneous T2 signal out, complicating torsion cannot be excluded. 3. Moderate abdominopelvic ascites, without specific evidence of peritoneal metastasis. Consider further evaluation with contrast-enhanced abdominopelvic CT.  12/26/2016 Pathology Results   1. Uterus, ovaries and fallopian tubes - ADENOCARCINOMA INVOLVING RIGHT FALLOPIAN TUBE, RIGHT AND LEFT OVARIES, UTERINE SEROSA AND PELVIC MASS. - SEE  ONCOLOGY TABLE AND COMMENT. - UTERINE CERVIX, ENDOMETRIUM, MYOMETRIUM AND LEFT FALLOPIAN TUBE FREE OF TUMOR. 2. Omentum, resection for tumor - ADENOCARCINOMA. Microscopic Comment 1. ONCOLOGY TABLE - FALLOPIAN TUBE. SEE COMMENT 1. Specimen, including laterality: Uterus, bilateral adnexa, pelvic mass and omentum. 2. Procedure: Hysterectomy with bilateral salpingo-oophorectomy, pelvic mass excision and omentectomy. 3. Lymph node sampling performed: No 4. Tumor site: See comment. 5. Tumor location in fallopian tube: Right fallopian tube fimbria 6. Specimen integrity (intact/ruptured/disrupted): Intact 7. Tumor size (cm): 18 cm, see comment. 8. Histologic type: Adenocarcinoma 9. Grade: High grade 10. Microscopic tumor extension: Tumor involves right fallopian tube, right and left ovaries, uterine serosa, pelvic mass and omentum. 11. Margins: See comment. 12. Lymph-Vascular invasion: Present 13. Lymph nodes: # examined: 0; # positive: N/A 14. TNM: pT3c, pNX 15. FIGO Stage (based on pathologic findings, needs clinical correlation: III-C 16. Comments: There is an 18 cm pelvic mass which is a high grade adenocarcinoma and there is a 12.2 cm  segment of omentum which is extensively involved with adenocarcinoma. The tumor also involves the fimbria of the right fallopian tube and the parenchyma of the right and left ovaries as well as uterine serosa. The fimbria of the right fallopian has intraepithelial atypia consistent with a precursor lesion and therefore this is most consistent with primary fallopian tube adenocarcinoma. A primary peritoneal serous carcinoma is a less likely possibility.    12/26/2016 Pathology Results   PERITONEAL/ASCITIC FLUID(SPECIMEN 1 OF 1 COLLECTED 12/26/16): POORLY DIFFERENTIATED ADENOCARCINOMA   12/26/2016 Surgery   Procedure(s) Performed: Exploratory laparotomy with total abdominal hysterectomy, bilateral salpingo-oophorectomy, omentectomy radical tumor debulking for ovarian  cancer .  Surgeon: Thereasa Solo, MD.   Operative Findings: 20cm left ovarian mass densely adherent to the posterior uterus, right tube and ovary, cervix, sigmoid colon. 10cm omental cake. 4L ascites. 191m size tumor nodules on the serosa of the terminal ileum and proximal sigmoid colon. 155mnodules on right diaphragm.    This represented an optimal cytoreduction (R1) with 91m58modules on intestine and diaphragm representing gross visible disease   01/16/2017 Procedure   Placement of single lumen port a cath via right internal jugular vein. The catheter tip lies at the cavo-atrial junction. A power injectable port a cath was placed and is ready for immediate use.   01/17/2017 Imaging   1. 3.5 x 7.2 x 4.6 cm fluid collection along the vaginal cuff, posterior the bladder and extending into the right adnexal space. This lesion demonstrates rim enhancement. Imaging features could be related to a loculated postoperative seroma or hematoma. Superinfection cannot be excluded by CT. Given debulking surgery was 3 weeks ago, this entire structure is un likely to represent neoplasm, but peritoneal involvement could have this appearance. 2. Small fluid collection in the left para colic gutter without rim enhancement. 3. Irregular/nodular appearance of the peritoneal M in the anatomic pelvis with areas of subtle nodularity between the stomach and the spleen. Close attention in these regions on follow-up recommended as metastatic disease is a concern. 4. Mildly enlarged hepatoduodenal ligament lymph node. Attention on follow-up recommended.   01/20/2017 Tumor Marker   Patient's tumor was tested for the following markers: CA-125 Results of the tumor marker test revealed 46.6   01/28/2017 Genetic Testing       Negative genetic testing on the [facility]  panel.  The MyRisk gene panel offered by [employer] includes sequencing and deletion/duplication testing of the following 28 genes: APC, ATM,  BARD1, BMPR1A, BRCA1, BRCA2, BRIP1, CHD1, CDK4, CDKN2A, CHEK2, EPCAM (large rearrangement only), MLH1, MSH2, MSH6, MUTYH, NBN, PALB2, PMS2, PTEN, RAD51C, RAD51D, SMAD4, STK11, and TP53. Sequencing was performed for select regions of POLE and POLD1, and large rearrangement analysis was performed for select regions of GREM1. The report date is January 28, 2017.  HRD testing looking for genomic instability and BRCA mutations was negative.  The report date of this test is January 27, 2017.    01/31/2017 Tumor Marker   Patient's tumor was tested for the following markers: CA-125 Results of the tumor marker test revealed 22.4   03/04/2017 Tumor Marker   Patient's tumor was tested for the following markers: CA-125 Results of the tumor marker test revealed 13.8   04/18/2017 Tumor Marker   Patient's tumor was tested for the following markers: CA-125 Results of the tumor marker test revealed 11.9   05/05/2017 Tumor Marker   Patient's tumor was tested for the following markers: CA-125 Results of the tumor marker test revealed 12   05/26/2017 Tumor Marker   Patient's tumor was tested for the following markers: CA-125 Results of the tumor marker test revealed 10.6   05/26/2017 Imaging   1. A previously noted postoperative fluid collection in the low pelvis and residual ascites seen on the prior examination has resolved on today's study. No findings to suggest residual/recurrent disease on today's examination. No definite solid organ metastasis identified in the abdomen or pelvis. No lymphadenopathy. 2. Aortic atherosclerosis.   06/10/2017 Procedure   Successful right IJ vein Port-A-Cath explant.   03/09/2018 Tumor Marker   Patient's tumor was tested for the following markers: CA-125 Results of the tumor marker test revealed 12.1   05/26/2018 Tumor Marker   Patient's tumor was tested for the following markers: CA-125 Results of the tumor marker test revealed 13.8   09/09/2018 Tumor Marker   Patient's  tumor was tested for the following markers: CA-125 Results of the tumor marker test revealed 23.9   12/18/2018 Tumor Marker   Patient's tumor was tested for the following markers: CA-125 Results of the tumor marker test revealed 43.8   02/27/2019 - 02/28/2019 Hospital Admission   She was admitted for bowel obstruction   02/27/2019 Imaging   1. High-grade small bowel obstruction with transition point in the pelvis in an area of suspected desmoplastic reaction surrounding a 1.3 cm spiculated nodule in the mesentery, suspicious for carcinoid. There is hyperenhancement near the tip of the appendix and loss of the normal fat plane between it and the adjacent sigmoid colon, potentially the site of primary lesion. 2. Multiple new small subcentimeter hyperdense lesions scattered along the liver capsule, concerning for metastases. Enlarged hyperenhancing gastrohepatic and portacaval lymph nodes concerning for nodal metastases. 3. Very mild right hydroureteronephrosis to the level of the desmoplastic reaction in the pelvis, potentially involving the right ureter. 4. Infraumbilical ventral abdominal wall diastasis containing a small nondilated portion of transverse colon. 5. Trace ascites. 6. Cholelithiasis. 7.  Aortic atherosclerosis (ICD10-I70.0).     03/05/2019 Tumor Marker   Patient's tumor was tested for the following markers: CA-125 Results of the tumor marker test revealed 70.1   03/12/2019 Echocardiogram   IMPRESSIONS     1. Left ventricular ejection fraction, by visual estimation, is 60 to 65%. The left ventricle has normal function. There is no left ventricular hypertrophy.  2.  Left ventricular diastolic parameters are consistent with Grade I diastolic dysfunction (impaired relaxation).  3. Global right ventricle has normal systolic function.The right ventricular size is normal. No increase in right ventricular wall thickness.  4. Left atrial size was normal.  5. Right atrial size was  normal.  6. The pericardium was not assessed.  7. The mitral valve is normal in structure. No evidence of mitral valve regurgitation.  8. The tricuspid valve is normal in structure. Tricuspid valve regurgitation is trivial.  9. The aortic valve is normal in structure. Aortic valve regurgitation is not visualized. 10. The pulmonic valve was grossly normal. Pulmonic valve regurgitation is not visualized. 11. The average left ventricular global longitudinal strain is -17.6 %.   03/16/2019 Procedure   Successful placement of a right internal jugular approach power injectable Port-A-Cath. The catheter is ready for immediate use.     04/19/2019 Tumor Marker   Patient's tumor was tested for the following markers: CA-125 Results of the tumor marker test revealed 39.8   05/17/2019 Tumor Marker   Patient's tumor was tested for the following markers: CA-125 Results of the tumor marker test revealed 27   05/28/2019 Tumor Marker   Patient's tumor was tested for the following markers: CA-125 Results of the tumor marker test revealed 27.7.   06/11/2019 Imaging   1. No new or progressive metastatic disease in the abdomen or pelvis. 2. Tiny noncalcified perisplenic implant is decreased. Calcified pericardiophrenic, retroperitoneal and bilateral inguinal lymph nodes and scattered small calcified perihepatic and perisplenic implants are stable. 3.  Aortic Atherosclerosis (ICD10-I70.0).         REVIEW OF SYSTEMS:   Constitutional: Denies fevers, chills or abnormal weight loss Eyes: Denies blurriness of vision Respiratory: Denies cough, dyspnea or wheezes Cardiovascular: Denies palpitation, chest discomfort or lower extremity swelling Gastrointestinal:  Denies nausea, heartburn or change in bowel habits Lymphatics: Denies new lymphadenopathy or easy bruising Neurological:Denies numbness, tingling or new weaknesses Behavioral/Psych: Mood is stable, no new changes  All other systems were reviewed with  the patient and are negative.  I have reviewed the past medical history, past surgical history, social history and family history with the patient and they are unchanged from previous note.  ALLERGIES:  is allergic to dilaudid [hydromorphone hcl]; morphine and related; and sudafed [pseudoephedrine hcl].  MEDICATIONS:  Current Outpatient Medications  Medication Sig Dispense Refill  . acetaminophen (TYLENOL) 325 MG tablet Take 975 mg by mouth every 6 (six) hours as needed (pain).    Marland Kitchen amoxicillin (AMOXIL) 500 MG capsule Take 500 mg by mouth 3 (three) times daily.    Marland Kitchen HYDROcodone-acetaminophen (NORCO/VICODIN) 5-325 MG tablet Take 1 tablet by mouth every 6 (six) hours as needed.    . lidocaine-prilocaine (EMLA) cream APP EXT AA 1 TIME    . methylPREDNISolone (MEDROL DOSEPAK) 4 MG TBPK tablet Take 4 mg by mouth See admin instructions. follow package directions    . ondansetron (ZOFRAN) 8 MG tablet Take 1 tablet (8 mg total) by mouth every 8 (eight) hours as needed. 30 tablet 1  . prochlorperazine (COMPAZINE) 10 MG tablet Take 1 tablet (10 mg total) by mouth every 6 (six) hours as needed (Nausea or vomiting). 30 tablet 1  . promethazine (PHENERGAN) 25 MG suppository Place 1 suppository (25 mg total) rectally 2 (two) times daily as needed for nausea or vomiting. 12 each 0  . traMADol (ULTRAM) 50 MG tablet Take 50 mg by mouth 3 (three) times daily as needed.    Marland Kitchen  venlafaxine XR (EFFEXOR-XR) 37.5 MG 24 hr capsule TAKE 1 CAPSULE BY MOUTH DAILY WITH BREAKFAST (Patient taking differently: Take 37.5 mg by mouth daily with breakfast. ) 90 capsule 6   No current facility-administered medications for this visit.   Facility-Administered Medications Ordered in Other Visits  Medication Dose Route Frequency Provider Last Rate Last Admin  . sodium chloride flush (NS) 0.9 % injection 10 mL  10 mL Intravenous PRN Alvy Bimler, Aideliz Garmany, MD   10 mL at 02/11/17 0839  . sodium chloride flush (NS) 0.9 % injection 10 mL  10 mL  Intravenous PRN Heath Lark, MD   10 mL at 03/04/17 1510    PHYSICAL EXAMINATION: ECOG PERFORMANCE STATUS: 1 - Symptomatic but completely ambulatory  Vitals:   06/14/19 1016  BP: (!) 115/55  Pulse: 82  Resp: 18  Temp: 98 F (36.7 C)  SpO2: 100%   Filed Weights   06/14/19 1016  Weight: 176 lb (79.8 kg)    GENERAL:alert, no distress and comfortable SKIN: Noted skin rash at the sun exposed area NEURO: alert & oriented x 3 with fluent speech, no focal motor/sensory deficits  LABORATORY DATA:  I have reviewed the data as listed    Component Value Date/Time   NA 141 05/28/2019 1242   NA 139 04/14/2017 0742   K 4.3 05/28/2019 1242   K 4.0 04/14/2017 0742   CL 107 05/28/2019 1242   CO2 25 05/28/2019 1242   CO2 21 (L) 04/14/2017 0742   GLUCOSE 93 05/28/2019 1242   GLUCOSE 247 (H) 04/14/2017 0742   BUN 14 05/28/2019 1242   BUN 13.1 04/14/2017 0742   CREATININE 0.82 05/28/2019 1242   CREATININE 0.9 04/14/2017 0742   CALCIUM 8.8 (L) 05/28/2019 1242   CALCIUM 9.2 04/14/2017 0742   PROT 6.7 05/28/2019 1242   PROT 7.3 04/14/2017 0742   ALBUMIN 3.6 05/28/2019 1242   ALBUMIN 3.9 04/14/2017 0742   AST 14 (L) 05/28/2019 1242   AST 17 04/14/2017 0742   ALT 18 05/28/2019 1242   ALT 14 04/14/2017 0742   ALKPHOS 73 05/28/2019 1242   ALKPHOS 70 04/14/2017 0742   BILITOT 0.3 05/28/2019 1242   BILITOT 0.37 04/14/2017 0742   GFRNONAA >60 05/28/2019 1242   GFRAA >60 05/28/2019 1242    No results found for: SPEP, UPEP  Lab Results  Component Value Date   WBC 3.7 (L) 05/28/2019   NEUTROABS 1.8 05/28/2019   HGB 9.5 (L) 05/28/2019   HCT 27.7 (L) 05/28/2019   MCV 94.5 05/28/2019   PLT 154 05/28/2019      Chemistry      Component Value Date/Time   NA 141 05/28/2019 1242   NA 139 04/14/2017 0742   K 4.3 05/28/2019 1242   K 4.0 04/14/2017 0742   CL 107 05/28/2019 1242   CO2 25 05/28/2019 1242   CO2 21 (L) 04/14/2017 0742   BUN 14 05/28/2019 1242   BUN 13.1 04/14/2017  0742   CREATININE 0.82 05/28/2019 1242   CREATININE 0.9 04/14/2017 0742      Component Value Date/Time   CALCIUM 8.8 (L) 05/28/2019 1242   CALCIUM 9.2 04/14/2017 0742   ALKPHOS 73 05/28/2019 1242   ALKPHOS 70 04/14/2017 0742   AST 14 (L) 05/28/2019 1242   AST 17 04/14/2017 0742   ALT 18 05/28/2019 1242   ALT 14 04/14/2017 0742   BILITOT 0.3 05/28/2019 1242   BILITOT 0.37 04/14/2017 0742       RADIOGRAPHIC STUDIES: I have  reviewed multiple imaging studies with the patient and her husband I have personally reviewed the radiological images as listed and agreed with the findings in the report. CT ABDOMEN PELVIS W CONTRAST  Result Date: 06/11/2019 CLINICAL DATA:  Ovarian cancer diagnosed in 2018. History of radical tumor debulking with omentectomy 12/26/2016. Ongoing chemotherapy. Restaging. EXAM: CT ABDOMEN AND PELVIS WITH CONTRAST TECHNIQUE: Multidetector CT imaging of the abdomen and pelvis was performed using the standard protocol following bolus administration of intravenous contrast. CONTRAST:  134m OMNIPAQUE IOHEXOL 300 MG/ML  SOLN COMPARISON:  02/27/2019 CT abdomen/pelvis. FINDINGS: Lower chest: No significant pulmonary nodules or acute consolidative airspace disease. Calcified nonenlarged bilateral pericardiophrenic lymph nodes are unchanged. Hepatobiliary: Normal liver size. No liver parenchymal masses. Several scattered tiny calcified implants throughout the perihepatic space measuring up to 0.9 cm along the posterior right liver dome (series 2/image 15) are unchanged. Cholelithiasis. No biliary ductal dilatation. Pancreas: Normal, with no mass or duct dilation. Spleen: Normal size. No mass. Adrenals/Urinary Tract: Normal adrenals. Normal kidneys with no hydronephrosis and no renal mass. Normal bladder. Stomach/Bowel: Small hiatal hernia. Otherwise normal nondistended stomach. Normal caliber small bowel with no small bowel wall thickening. Normal appendix. Stable moderate infraumbilical  ventral abdominal hernia containing a portion of the transverse colon. No large bowel wall thickening, caliber transition, diverticulosis or acute pericolonic fat stranding. Oral contrast transits to the rectum. Vascular/Lymphatic: Atherosclerotic nonaneurysmal abdominal aorta. Patent portal, splenic, hepatic and renal veins. Stable borderline prominent calcified portacaval nodes, largest 1.0 cm (series 2/image 22). Stable partially calcified borderline prominent bilateral inguinal lymph nodes measuring up to 1.1 cm on the right (series 2/image 75) and 1.1 cm on the left (series 2/image 76). Calcified nonenlarged aortocaval nodes are unchanged. No new pathologically enlarged abdominopelvic nodes. Reproductive: Status post hysterectomy, with no abnormal findings at the vaginal cuff. No adnexal mass. Other: No pneumoperitoneum, ascites or focal fluid collection. Stable mild peritoneal thickening in the pelvic sidewalls. Stable subcentimeter calcified perisplenic implant. Noncalcified 0.5 cm perisplenic implant is decreased from 0.8 cm (series 2/image 23). No new peritoneal implants. Musculoskeletal: No aggressive appearing focal osseous lesions. Moderate thoracolumbar spondylosis. IMPRESSION: 1. No new or progressive metastatic disease in the abdomen or pelvis. 2. Tiny noncalcified perisplenic implant is decreased. Calcified pericardiophrenic, retroperitoneal and bilateral inguinal lymph nodes and scattered small calcified perihepatic and perisplenic implants are stable. 3.  Aortic Atherosclerosis (ICD10-I70.0). Electronically Signed   By: JIlona SorrelM.D.   On: 06/11/2019 09:27

## 2019-06-14 NOTE — Assessment & Plan Note (Signed)
I recommend her to use Magic mouthwash as needed

## 2019-06-14 NOTE — Assessment & Plan Note (Signed)
She has intermittent flare of support keratosis Recommend conservative management only

## 2019-06-14 NOTE — Assessment & Plan Note (Signed)
She is scheduled for dental extraction tomorrow There is no contraindication for her to proceed

## 2019-06-14 NOTE — Telephone Encounter (Signed)
Called and given echo appt for 2/25 at 10 am, arrive at [place]. Magic mouth wash Rx called in to [facility]. She verbalized understanding.

## 2019-06-16 ENCOUNTER — Telehealth: Payer: Self-pay | Admitting: Hematology and Oncology

## 2019-06-16 NOTE — Telephone Encounter (Signed)
Scheduled 3/29 appt per 2/22 sch msg. Patient is aware of appt date and times.

## 2019-06-17 ENCOUNTER — Ambulatory Visit (HOSPITAL_COMMUNITY)
Admission: RE | Admit: 2019-06-17 | Discharge: 2019-06-17 | Disposition: A | Discharge status: Home / Self Care | Payer: BC Managed Care – PPO | Source: Ambulatory Visit | Attending: Hematology and Oncology | Admitting: Hematology and Oncology

## 2019-06-17 ENCOUNTER — Other Ambulatory Visit: Payer: Self-pay

## 2019-06-17 DIAGNOSIS — I10 Essential (primary) hypertension: Secondary | ICD-10-CM | POA: Diagnosis not present

## 2019-06-17 DIAGNOSIS — Z01818 Encounter for other preprocedural examination: Secondary | ICD-10-CM | POA: Diagnosis not present

## 2019-06-17 DIAGNOSIS — Z5111 Encounter for antineoplastic chemotherapy: Secondary | ICD-10-CM

## 2019-06-17 DIAGNOSIS — C5701 Malignant neoplasm of right fallopian tube: Secondary | ICD-10-CM | POA: Diagnosis not present

## 2019-06-17 NOTE — Progress Notes (Signed)
  Echocardiogram 2D Echocardiogram has been performed.  Michiel Cowboy 06/17/2019, 10:47 AM

## 2019-06-18 ENCOUNTER — Telehealth: Payer: Self-pay

## 2019-06-18 NOTE — Telephone Encounter (Signed)
-----   Message from Heath Lark, MD sent at 06/18/2019  7:45 AM EST ----- Regarding: pls let her know echo result is ok

## 2019-06-18 NOTE — Telephone Encounter (Signed)
Called and left below message. Ask her to call the office for questions. ?

## 2019-06-21 ENCOUNTER — Inpatient Hospital Stay: Payer: BC Managed Care – PPO | Attending: Hematology and Oncology

## 2019-06-21 ENCOUNTER — Inpatient Hospital Stay: Payer: BC Managed Care – PPO

## 2019-06-21 ENCOUNTER — Other Ambulatory Visit: Payer: Self-pay

## 2019-06-21 VITALS — BP 142/76 | HR 83 | Temp 98.2°F | Resp 17 | Wt 186.6 lb

## 2019-06-21 DIAGNOSIS — Z7189 Other specified counseling: Secondary | ICD-10-CM

## 2019-06-21 DIAGNOSIS — C5701 Malignant neoplasm of right fallopian tube: Secondary | ICD-10-CM | POA: Diagnosis not present

## 2019-06-21 DIAGNOSIS — D61818 Other pancytopenia: Secondary | ICD-10-CM | POA: Diagnosis not present

## 2019-06-21 DIAGNOSIS — Z5111 Encounter for antineoplastic chemotherapy: Secondary | ICD-10-CM | POA: Insufficient documentation

## 2019-06-21 DIAGNOSIS — Z90722 Acquired absence of ovaries, bilateral: Secondary | ICD-10-CM | POA: Diagnosis not present

## 2019-06-21 DIAGNOSIS — Z90710 Acquired absence of both cervix and uterus: Secondary | ICD-10-CM | POA: Insufficient documentation

## 2019-06-21 DIAGNOSIS — I10 Essential (primary) hypertension: Secondary | ICD-10-CM | POA: Insufficient documentation

## 2019-06-21 LAB — CMP (CANCER CENTER ONLY)
ALT: 13 U/L (ref 0–44)
AST: 11 U/L — ABNORMAL LOW (ref 15–41)
Albumin: 3.6 g/dL (ref 3.5–5.0)
Alkaline Phosphatase: 69 U/L (ref 38–126)
Anion gap: 8 (ref 5–15)
BUN: 22 mg/dL (ref 8–23)
CO2: 26 mmol/L (ref 22–32)
Calcium: 8.6 mg/dL — ABNORMAL LOW (ref 8.9–10.3)
Chloride: 104 mmol/L (ref 98–111)
Creatinine: 0.88 mg/dL (ref 0.44–1.00)
GFR, Est AFR Am: >60 mL/min (ref >60)
GFR, Estimated: >60 mL/min (ref >60)
Glucose, Bld: 176 mg/dL — ABNORMAL HIGH (ref 70–99)
Potassium: 3.7 mmol/L (ref 3.5–5.1)
Sodium: 138 mmol/L (ref 135–145)
Total Bilirubin: 0.3 mg/dL (ref 0.3–1.2)
Total Protein: 6.7 g/dL (ref 6.5–8.1)

## 2019-06-21 LAB — CBC WITH DIFFERENTIAL (CANCER CENTER ONLY)
Abs Immature Granulocytes: 0.09 10*3/uL — ABNORMAL HIGH (ref 0.00–0.07)
Basophils Absolute: 0 10*3/uL (ref 0.0–0.1)
Basophils Relative: 1 %
Eosinophils Absolute: 0.1 10*3/uL (ref 0.0–0.5)
Eosinophils Relative: 1 %
HCT: 36.4 % (ref 36.0–46.0)
Hemoglobin: 11.9 g/dL — ABNORMAL LOW (ref 12.0–15.0)
Immature Granulocytes: 1 %
Lymphocytes Relative: 32 %
Lymphs Abs: 2.1 10*3/uL (ref 0.7–4.0)
MCH: 32.6 pg (ref 26.0–34.0)
MCHC: 32.7 g/dL (ref 30.0–36.0)
MCV: 99.7 fL (ref 80.0–100.0)
Monocytes Absolute: 0.7 10*3/uL (ref 0.1–1.0)
Monocytes Relative: 10 %
Neutro Abs: 3.6 10*3/uL (ref 1.7–7.7)
Neutrophils Relative %: 55 %
Platelet Count: 244 10*3/uL (ref 150–400)
RBC: 3.65 MIL/uL — ABNORMAL LOW (ref 3.87–5.11)
RDW: 16.9 % — ABNORMAL HIGH (ref 11.5–15.5)
WBC Count: 6.6 10*3/uL (ref 4.0–10.5)
nRBC: 0.3 % — ABNORMAL HIGH (ref 0.0–0.2)

## 2019-06-21 MED ORDER — FAMOTIDINE IN NACL 20-0.9 MG/50ML-% IV SOLN
20.0000 mg | Freq: Once | INTRAVENOUS | Status: AC
  Administered 2019-06-21: 20 mg via INTRAVENOUS

## 2019-06-21 MED ORDER — SODIUM CHLORIDE 0.9 % IV SOLN
550.0000 mg | Freq: Once | INTRAVENOUS | Status: AC
  Administered 2019-06-21: 550 mg via INTRAVENOUS
  Filled 2019-06-21: qty 55

## 2019-06-21 MED ORDER — DIPHENHYDRAMINE HCL 50 MG/ML IJ SOLN
INTRAMUSCULAR | Status: AC
  Filled 2019-06-21: qty 1

## 2019-06-21 MED ORDER — PALONOSETRON HCL INJECTION 0.25 MG/5ML
INTRAVENOUS | Status: AC
  Filled 2019-06-21: qty 5

## 2019-06-21 MED ORDER — DEXTROSE 5 % IV SOLN
Freq: Once | INTRAVENOUS | Status: AC
  Filled 2019-06-21: qty 250

## 2019-06-21 MED ORDER — SODIUM CHLORIDE 0.9 % IV SOLN
Freq: Once | INTRAVENOUS | Status: AC
  Filled 2019-06-21: qty 5

## 2019-06-21 MED ORDER — DIPHENHYDRAMINE HCL 50 MG/ML IJ SOLN
25.0000 mg | Freq: Once | INTRAMUSCULAR | Status: AC
  Administered 2019-06-21: 25 mg via INTRAVENOUS

## 2019-06-21 MED ORDER — DOXORUBICIN HCL LIPOSOMAL CHEMO INJECTION 2 MG/ML
31.0000 mg/m2 | Freq: Once | INTRAVENOUS | Status: AC
  Administered 2019-06-21: 60 mg via INTRAVENOUS
  Filled 2019-06-21: qty 30

## 2019-06-21 MED ORDER — HEPARIN SOD (PORK) LOCK FLUSH 100 UNIT/ML IV SOLN
500.0000 [IU] | Freq: Once | INTRAVENOUS | Status: AC | PRN
  Administered 2019-06-21: 500 [IU]
  Filled 2019-06-21: qty 5

## 2019-06-21 MED ORDER — FAMOTIDINE IN NACL 20-0.9 MG/50ML-% IV SOLN
INTRAVENOUS | Status: AC
  Filled 2019-06-21: qty 50

## 2019-06-21 MED ORDER — SODIUM CHLORIDE 0.9% FLUSH
10.0000 mL | INTRAVENOUS | Status: DC | PRN
  Administered 2019-06-21: 10 mL
  Filled 2019-06-21: qty 10

## 2019-06-21 MED ORDER — PALONOSETRON HCL INJECTION 0.25 MG/5ML
0.2500 mg | Freq: Once | INTRAVENOUS | Status: AC
  Administered 2019-06-21: 0.25 mg via INTRAVENOUS

## 2019-06-21 MED ORDER — SODIUM CHLORIDE 0.9% FLUSH
10.0000 mL | Freq: Once | INTRAVENOUS | Status: AC
  Administered 2019-06-21: 10 mL
  Filled 2019-06-21: qty 10

## 2019-06-21 NOTE — Patient Instructions (Signed)
COVID-19 Vaccine Information can be found at: [email or web address] For questions related to vaccine distribution or appointments, please email [email or web address] or call [phone].   [facility] Discharge Instructions for Patients Receiving Chemotherapy  Today you received the following chemotherapy agents: Doxorubicin Liposomal (Doxil) and Carboplatin (Paraplatin)  To help prevent nausea and vomiting after your treatment, we encourage you to take your nausea medication as directed by your provider.   If you develop nausea and vomiting that is not controlled by your nausea medication, call the clinic.   BELOW ARE SYMPTOMS THAT SHOULD BE REPORTED IMMEDIATELY:  *FEVER GREATER THAN 100.5 F  *CHILLS WITH OR WITHOUT FEVER  NAUSEA AND VOMITING THAT IS NOT CONTROLLED WITH YOUR NAUSEA MEDICATION  *UNUSUAL SHORTNESS OF BREATH  *UNUSUAL BRUISING OR BLEEDING  TENDERNESS IN MOUTH AND THROAT WITH OR WITHOUT PRESENCE OF ULCERS  *URINARY PROBLEMS  *BOWEL PROBLEMS  UNUSUAL RASH Items with * indicate a potential emergency and should be followed up as soon as possible.  Feel free to call the clinic should you have any questions or concerns. The clinic phone number is [phone].  Please show the [facility] at check-in to the Emergency Department and triage nurse.  Coronavirus (COVID-19) Are you at risk?  Are you at risk for the Coronavirus (COVID-19)?  To be considered HIGH RISK for Coronavirus (COVID-19), you have to meet the following criteria:  . Traveled to [place], [place], [place], [place] or [place]; or in the [place] to [place], [place], [place], or [place]; and have fever, cough, and shortness of breath within the last 2 weeks of travel OR . Been in close contact with a person diagnosed with COVID-19 within the last 2 weeks and have fever, cough, and shortness of breath . IF YOU DO  NOT MEET THESE CRITERIA, YOU ARE CONSIDERED LOW RISK FOR COVID-19.  What to do if you are HIGH RISK for COVID-19?  Marland Kitchen If you are having a medical emergency, call 911. . Seek medical care right away. Before you go to a doctor's office, urgent care or emergency department, call ahead and tell them about your recent travel, contact with someone diagnosed with COVID-19, and your symptoms. You should receive instructions from your physician's office regarding next steps of care.  . When you arrive at healthcare provider, tell the healthcare staff immediately you have returned from visiting [place], [place], [place], [place] or [place]; or traveled in the [place] to [place], [place], [place], or [place]; in the last two weeks or you have been in close contact with a person diagnosed with COVID-19 in the last 2 weeks.   . Tell the health care staff about your symptoms: fever, cough and shortness of breath. . After you have been seen by a medical provider, you will be either: o Tested for (COVID-19) and discharged home on quarantine except to seek medical care if symptoms worsen, and asked to  - Stay home and avoid contact with others until you get your results (4-5 days)  - Avoid travel on public transportation if possible (such as bus, train, or airplane) or o Sent to the Emergency Department by EMS for evaluation, COVID-19 testing, and possible admission depending on your condition and test results.  What to do if you are LOW RISK for COVID-19?  Reduce your risk of any infection by using the same precautions used for avoiding the common cold or flu:  Marland Kitchen Wash your hands often with soap and  warm water for at least 20 seconds.  If soap and water are not readily available, use an alcohol-based hand sanitizer with at least 60% alcohol.  . If coughing or sneezing, cover your mouth and nose by coughing or sneezing into the elbow areas of your shirt or coat, into a tissue or into your sleeve (not your  hands). . Avoid shaking hands with others and consider head nods or verbal greetings only. . Avoid touching your eyes, nose, or mouth with unwashed hands.  . Avoid close contact with people who are sick. . Avoid places or events with large numbers of people in one location, like concerts or sporting events. . Carefully consider travel plans you have or are making. . If you are planning any travel outside or inside the [place], visit the CDC's Travelers' Health webpage for the latest health notices. . If you have some symptoms but not all symptoms, continue to monitor at home and seek medical attention if your symptoms worsen. . If you are having a medical emergency, call 911.   [place] / e-Visit: [email or web address]         [facility]: [facility]: [phone]                   [facility]: [phone]

## 2019-06-22 LAB — CA 125: Cancer Antigen (CA) 125: 18.7 U/mL (ref 0.0–38.1)

## 2019-06-25 ENCOUNTER — Telehealth: Payer: Self-pay

## 2019-06-25 NOTE — Telephone Encounter (Signed)
She called and left a message, complaining of constipation. No bm in 3 days. Asking what laxative to take. Instructed to call the office back for questions. Left a message Instructing her to start Miralax daily, can take 2 x a day. Start Senokot prn.

## 2019-07-07 ENCOUNTER — Telehealth: Payer: Self-pay

## 2019-07-07 NOTE — Telephone Encounter (Signed)
Returned pt's call.   LVM for pt to call back.  

## 2019-07-19 ENCOUNTER — Inpatient Hospital Stay (HOSPITAL_BASED_OUTPATIENT_CLINIC_OR_DEPARTMENT_OTHER): Payer: BC Managed Care – PPO | Admitting: Hematology and Oncology

## 2019-07-19 ENCOUNTER — Other Ambulatory Visit: Payer: Self-pay

## 2019-07-19 ENCOUNTER — Inpatient Hospital Stay: Payer: BC Managed Care – PPO

## 2019-07-19 ENCOUNTER — Other Ambulatory Visit: Payer: Self-pay | Admitting: Hematology and Oncology

## 2019-07-19 ENCOUNTER — Encounter: Payer: Self-pay | Admitting: Hematology and Oncology

## 2019-07-19 ENCOUNTER — Telehealth: Payer: Self-pay | Admitting: Hematology and Oncology

## 2019-07-19 DIAGNOSIS — Z7189 Other specified counseling: Secondary | ICD-10-CM

## 2019-07-19 DIAGNOSIS — C5701 Malignant neoplasm of right fallopian tube: Secondary | ICD-10-CM

## 2019-07-19 DIAGNOSIS — I10 Essential (primary) hypertension: Secondary | ICD-10-CM | POA: Diagnosis not present

## 2019-07-19 DIAGNOSIS — D61818 Other pancytopenia: Secondary | ICD-10-CM

## 2019-07-19 DIAGNOSIS — Z5111 Encounter for antineoplastic chemotherapy: Secondary | ICD-10-CM | POA: Diagnosis not present

## 2019-07-19 DIAGNOSIS — Z90722 Acquired absence of ovaries, bilateral: Secondary | ICD-10-CM | POA: Diagnosis not present

## 2019-07-19 DIAGNOSIS — Z90710 Acquired absence of both cervix and uterus: Secondary | ICD-10-CM | POA: Diagnosis not present

## 2019-07-19 LAB — CMP (CANCER CENTER ONLY)
ALT: 13 U/L (ref 0–44)
AST: 21 U/L (ref 15–41)
Albumin: 3.6 g/dL (ref 3.5–5.0)
Alkaline Phosphatase: 74 U/L (ref 38–126)
Anion gap: 12 (ref 5–15)
BUN: 9 mg/dL (ref 8–23)
CO2: 23 mmol/L (ref 22–32)
Calcium: 8.9 mg/dL (ref 8.9–10.3)
Chloride: 107 mmol/L (ref 98–111)
Creatinine: 0.87 mg/dL (ref 0.44–1.00)
GFR, Est AFR Am: >60 mL/min (ref >60)
GFR, Estimated: >60 mL/min (ref >60)
Glucose, Bld: 163 mg/dL — ABNORMAL HIGH (ref 70–99)
Potassium: 3.8 mmol/L (ref 3.5–5.1)
Sodium: 142 mmol/L (ref 135–145)
Total Bilirubin: 0.3 mg/dL (ref 0.3–1.2)
Total Protein: 6.7 g/dL (ref 6.5–8.1)

## 2019-07-19 LAB — CBC WITH DIFFERENTIAL (CANCER CENTER ONLY)
Abs Immature Granulocytes: 0.02 10*3/uL (ref 0.00–0.07)
Basophils Absolute: 0 10*3/uL (ref 0.0–0.1)
Basophils Relative: 0 %
Eosinophils Absolute: 0 10*3/uL (ref 0.0–0.5)
Eosinophils Relative: 0 %
HCT: 27.8 % — ABNORMAL LOW (ref 36.0–46.0)
Hemoglobin: 9 g/dL — ABNORMAL LOW (ref 12.0–15.0)
Immature Granulocytes: 1 %
Lymphocytes Relative: 40 %
Lymphs Abs: 1.1 10*3/uL (ref 0.7–4.0)
MCH: 34 pg (ref 26.0–34.0)
MCHC: 32.4 g/dL (ref 30.0–36.0)
MCV: 104.9 fL — ABNORMAL HIGH (ref 80.0–100.0)
Monocytes Absolute: 0.6 10*3/uL (ref 0.1–1.0)
Monocytes Relative: 20 %
Neutro Abs: 1.1 10*3/uL — ABNORMAL LOW (ref 1.7–7.7)
Neutrophils Relative %: 39 %
Platelet Count: 290 10*3/uL (ref 150–400)
RBC: 2.65 MIL/uL — ABNORMAL LOW (ref 3.87–5.11)
RDW: 16.1 % — ABNORMAL HIGH (ref 11.5–15.5)
WBC Count: 2.7 10*3/uL — ABNORMAL LOW (ref 4.0–10.5)
nRBC: 0.7 % — ABNORMAL HIGH (ref 0.0–0.2)

## 2019-07-19 MED ORDER — SODIUM CHLORIDE 0.9% FLUSH
10.0000 mL | Freq: Once | INTRAVENOUS | Status: AC
  Administered 2019-07-19: 10 mL
  Filled 2019-07-19: qty 10

## 2019-07-19 NOTE — Assessment & Plan Note (Signed)
She is symptomatic with progressive pancytopenia secondary to treatment I recommend she takes a treatment break for 1 week and return next week to resume chemotherapy I also plan minor dose adjustment of carboplatin I recommend repeat imaging study after cycle 6 of therapy

## 2019-07-19 NOTE — Assessment & Plan Note (Signed)
She has significant progressive pancytopenia secondary to side effects of treatment She is symptomatic I recommend delaying treatment by 1 week and she agreed I plan to reduce the dose of carboplatin a little bit I told her this is not going to compromise efficacy of treatment We discussed neutropenic precaution

## 2019-07-19 NOTE — Assessment & Plan Note (Signed)
Her blood pressure is elevated but could be triggered by anxiety Observe only for now

## 2019-07-19 NOTE — Progress Notes (Signed)
[facility] OFFICE PROGRESS NOTE  Patient Care Team: Gaynelle Arabian, MD as PCP - General (Family Medicine) Heath Lark, MD as Consulting Physician (Hematology and Oncology) Everitt Amber, MD as Consulting Physician (Obstetrics and Gynecology)  ASSESSMENT & PLAN:  Adenocarcinoma of right fallopian tube [facility]) She is symptomatic with progressive pancytopenia secondary to treatment I recommend she takes a treatment break for 1 week and return next week to resume chemotherapy I also plan minor dose adjustment of carboplatin I recommend repeat imaging study after cycle 6 of therapy  Pancytopenia, acquired ([place]) She has significant progressive pancytopenia secondary to side effects of treatment She is symptomatic I recommend delaying treatment by 1 week and she agreed I plan to reduce the dose of carboplatin a little bit I told her this is not going to compromise efficacy of treatment We discussed neutropenic precaution  Essential hypertension Her blood pressure is elevated but could be triggered by anxiety Observe only for now   No orders of the defined types were placed in this encounter.   All questions were answered. The patient knows to call the clinic with any problems, questions or concerns. The total time spent in the appointment was 30 minutes encounter with patients including review of chart and various tests results, discussions about plan of care and coordination of care plan   Heath Lark, MD 07/19/2019 11:48 AM  INTERVAL HISTORY: Please see below for problem oriented charting. She returns for treatment She complained of recent fatigue No mouth sores, infection, fever or chills Appetite is fair She denies recent nausea or changes in bowel habits The patient denies any recent signs or symptoms of bleeding such as spontaneous epistaxis, hematuria or hematochezia.   SUMMARY OF ONCOLOGIC HISTORY: Oncology History Overview Note  Neg genetics    Adenocarcinoma of right fallopian tube ([place])  08/12/2016 Initial Diagnosis   The patient has many months of vague symptoms of fullness in the pelvis, urinary frequency and difficulty with defecation. She denies vaginal bleeding or rectal bleeding.    08/12/2016 Imaging   Abdomen X-ray in the ER: Moderate colonic stool burden without evidence of enteric obstruction   11/13/2016 Imaging   TVUS was performed on 11/13/16 which showed a large hypoechoic lobular mass seen posterior to the uterus measuring 18.2x16.1x11.8cm, blood flow was seen along the periphery of the mass. It was considered to be likely to be a fibroid vs pelvic mass vs ovarian mass. Neither ovary was removed. Moderate amount of free fluid in the pelvis. THe uterus measures 4x10x4cm. The endometrium is 40m.    12/05/2016 Imaging   MR pelvis 1. Mild limitations as detailed above. 2. Heterogeneous pelvic mass is favored to arise from the right ovary. Favor a solid ovarian neoplasm such as fibroma/Brenner's tumor. Given lesion size and heterogeneous T2 signal out, complicating torsion cannot be excluded. 3. Moderate abdominopelvic ascites, without specific evidence of peritoneal metastasis. Consider further evaluation with contrast-enhanced abdominopelvic CT.    12/26/2016 Pathology Results   1. Uterus, ovaries and fallopian tubes - ADENOCARCINOMA INVOLVING RIGHT FALLOPIAN TUBE, RIGHT AND LEFT OVARIES, UTERINE SEROSA AND PELVIC MASS. - SEE ONCOLOGY TABLE AND COMMENT. - UTERINE CERVIX, ENDOMETRIUM, MYOMETRIUM AND LEFT FALLOPIAN TUBE FREE OF TUMOR. 2. Omentum, resection for tumor - ADENOCARCINOMA. Microscopic Comment 1. ONCOLOGY TABLE - FALLOPIAN TUBE. SEE COMMENT 1. Specimen, including laterality: Uterus, bilateral adnexa, pelvic mass and omentum. 2. Procedure: Hysterectomy with bilateral salpingo-oophorectomy, pelvic mass excision and omentectomy. 3. Lymph node sampling performed: No 4. Tumor site: See  comment. 5. Tumor  location in fallopian tube: Right fallopian tube fimbria 6. Specimen integrity (intact/ruptured/disrupted): Intact 7. Tumor size (cm): 18 cm, see comment. 8. Histologic type: Adenocarcinoma 9. Grade: High grade 10. Microscopic tumor extension: Tumor involves right fallopian tube, right and left ovaries, uterine serosa, pelvic mass and omentum. 11. Margins: See comment. 12. Lymph-Vascular invasion: Present 13. Lymph nodes: # examined: 0; # positive: N/A 14. TNM: pT3c, pNX 15. FIGO Stage (based on pathologic findings, needs clinical correlation: III-C 16. Comments: There is an 18 cm pelvic mass which is a high grade adenocarcinoma and there is a 12.2 cm  segment of omentum which is extensively involved with adenocarcinoma. The tumor also involves the fimbria of the right fallopian tube and the parenchyma of the right and left ovaries as well as uterine serosa. The fimbria of the right fallopian has intraepithelial atypia consistent with a precursor lesion and therefore this is most consistent with primary fallopian tube adenocarcinoma. A primary peritoneal serous carcinoma is a less likely possibility.    12/26/2016 Pathology Results   PERITONEAL/ASCITIC FLUID(SPECIMEN 1 OF 1 COLLECTED 12/26/16): POORLY DIFFERENTIATED ADENOCARCINOMA   12/26/2016 Surgery   Procedure(s) Performed: Exploratory laparotomy with total abdominal hysterectomy, bilateral salpingo-oophorectomy, omentectomy radical tumor debulking for ovarian cancer .  Surgeon: Thereasa Solo, MD.   Operative Findings: 20cm left ovarian mass densely adherent to the posterior uterus, right tube and ovary, cervix, sigmoid colon. 10cm omental cake. 4L ascites. 85m size tumor nodules on the serosa of the terminal ileum and proximal sigmoid colon. 137mnodules on right diaphragm.    This represented an optimal cytoreduction (R1) with 90m44modules on intestine and diaphragm representing gross visible disease   01/16/2017 Procedure   Placement of  single lumen port a cath via right internal jugular vein. The catheter tip lies at the cavo-atrial junction. A power injectable port a cath was placed and is ready for immediate use.   01/17/2017 Imaging   1. 3.5 x 7.2 x 4.6 cm fluid collection along the vaginal cuff, posterior the bladder and extending into the right adnexal space. This lesion demonstrates rim enhancement. Imaging features could be related to a loculated postoperative seroma or hematoma. Superinfection cannot be excluded by CT. Given debulking surgery was 3 weeks ago, this entire structure is un likely to represent neoplasm, but peritoneal involvement could have this appearance. 2. Small fluid collection in the left para colic gutter without rim enhancement. 3. Irregular/nodular appearance of the peritoneal M in the anatomic pelvis with areas of subtle nodularity between the stomach and the spleen. Close attention in these regions on follow-up recommended as metastatic disease is a concern. 4. Mildly enlarged hepatoduodenal ligament lymph node. Attention on follow-up recommended.   01/20/2017 Tumor Marker   Patient's tumor was tested for the following markers: CA-125 Results of the tumor marker test revealed 46.6   01/28/2017 Genetic Testing       Negative genetic testing [facility] the [facility].  The [facility] panel offered by [employer] sequencing and deletion/duplication testing of the following 28 genes: APC, ATM, BARD1, BMPR1A, BRCA1, BRCA2, BRIP1, CHD1, CDK4, CDKN2A, CHEK2, EPCAM (large rearrangement only), MLH1, MSH2, MSH6, MUTYH, NBN, PALB2, PMS2, PTEN, RAD51C, RAD51D, SMAD4, STK11, and TP53. Sequencing was performed for select regions of POLE and POLD1, and large rearrangement analysis was performed for select regions of GREM1. The report date is January 28, 2017.  HRD testing looking for genomic instability and BRCA mutations was negative.  The report  date of this test is January 27, 2017.     01/31/2017 Tumor Marker   Patient's tumor was tested for the following markers: CA-125 Results of the tumor marker test revealed 22.4   03/04/2017 Tumor Marker   Patient's tumor was tested for the following markers: CA-125 Results of the tumor marker test revealed 13.8   04/18/2017 Tumor Marker   Patient's tumor was tested for the following markers: CA-125 Results of the tumor marker test revealed 11.9   05/05/2017 Tumor Marker   Patient's tumor was tested for the following markers: CA-125 Results of the tumor marker test revealed 12   05/26/2017 Tumor Marker   Patient's tumor was tested for the following markers: CA-125 Results of the tumor marker test revealed 10.6   05/26/2017 Imaging   1. A previously noted postoperative fluid collection in the low pelvis and residual ascites seen on the prior examination has resolved on today's study. No findings to suggest residual/recurrent disease on today's examination. No definite solid organ metastasis identified in the abdomen or pelvis. No lymphadenopathy. 2. Aortic atherosclerosis.   06/10/2017 Procedure   Successful right IJ vein Port-A-Cath explant.   03/09/2018 Tumor Marker   Patient's tumor was tested for the following markers: CA-125 Results of the tumor marker test revealed 12.1   05/26/2018 Tumor Marker   Patient's tumor was tested for the following markers: CA-125 Results of the tumor marker test revealed 13.8   09/09/2018 Tumor Marker   Patient's tumor was tested for the following markers: CA-125 Results of the tumor marker test revealed 23.9   12/18/2018 Tumor Marker   Patient's tumor was tested for the following markers: CA-125 Results of the tumor marker test revealed 43.8   02/27/2019 - 02/28/2019 Hospital Admission   She was admitted for bowel obstruction   02/27/2019 Imaging   1. High-grade small bowel obstruction with transition point in the pelvis in an area of suspected desmoplastic reaction surrounding a 1.3 cm  spiculated nodule in the mesentery, suspicious for carcinoid. There is hyperenhancement near the tip of the appendix and loss of the normal fat plane between it and the adjacent sigmoid colon, potentially the site of primary lesion. 2. Multiple new small subcentimeter hyperdense lesions scattered along the liver capsule, concerning for metastases. Enlarged hyperenhancing gastrohepatic and portacaval lymph nodes concerning for nodal metastases. 3. Very mild right hydroureteronephrosis to the level of the desmoplastic reaction in the pelvis, potentially involving the right ureter. 4. Infraumbilical ventral abdominal wall diastasis containing a small nondilated portion of transverse colon. 5. Trace ascites. 6. Cholelithiasis. 7.  Aortic atherosclerosis (ICD10-I70.0).     03/05/2019 Tumor Marker   Patient's tumor was tested for the following markers: CA-125 Results of the tumor marker test revealed 70.1   03/12/2019 Echocardiogram   IMPRESSIONS     1. Left ventricular ejection fraction, by visual estimation, is 60 to 65%. The left ventricle has normal function. There is no left ventricular hypertrophy.  2. Left ventricular diastolic parameters are consistent with Grade I diastolic dysfunction (impaired relaxation).  3. Global right ventricle has normal systolic function.The right ventricular size is normal. No increase in right ventricular wall thickness.  4. Left atrial size was normal.  5. Right atrial size was normal.  6. The pericardium was not assessed.  7. The mitral valve is normal in structure. No evidence of mitral valve regurgitation.  8. The tricuspid valve is normal in structure. Tricuspid valve regurgitation is trivial.  9. The aortic valve is normal in structure.  Aortic valve regurgitation is not visualized. 10. The pulmonic valve was grossly normal. Pulmonic valve regurgitation is not visualized. 11. The average left ventricular global longitudinal strain is -17.6 %.    03/16/2019 Procedure   Successful placement of a right internal jugular approach power injectable Port-A-Cath. The catheter is ready for immediate use.     04/19/2019 Tumor Marker   Patient's tumor was tested for the following markers: CA-125 Results of the tumor marker test revealed 39.8   05/17/2019 Tumor Marker   Patient's tumor was tested for the following markers: CA-125 Results of the tumor marker test revealed 27   05/28/2019 Tumor Marker   Patient's tumor was tested for the following markers: CA-125 Results of the tumor marker test revealed 27.7.   06/11/2019 Imaging   1. No new or progressive metastatic disease in the abdomen or pelvis. 2. Tiny noncalcified perisplenic implant is decreased. Calcified pericardiophrenic, retroperitoneal and bilateral inguinal lymph nodes and scattered small calcified perihepatic and perisplenic implants are stable. 3.  Aortic Atherosclerosis (ICD10-I70.0).       06/17/2019 Echocardiogram    1. Left ventricular ejection fraction, by estimation, is 65 to 70%. The left ventricle has normal function. The left ventricle has no regional wall motion abnormalities. Left ventricular diastolic parameters were normal.  2. Right ventricular systolic function is normal. The right ventricular size is normal.  3. The mitral valve is normal in structure and function. No evidence of mitral valve regurgitation. No evidence of mitral stenosis.  4. The aortic valve is normal in structure and function. Aortic valve regurgitation is not visualized. No aortic stenosis is present.   06/21/2019 Tumor Marker   Patient's tumor was tested for the following markers: CA-125 Results of the tumor marker test revealed 18.7     REVIEW OF SYSTEMS:   Constitutional: Denies fevers, chills or abnormal weight loss Eyes: Denies blurriness of vision Ears, nose, mouth, throat, and face: Denies mucositis or sore throat Respiratory: Denies cough, dyspnea or wheezes Cardiovascular:  Denies palpitation, chest discomfort or lower extremity swelling Gastrointestinal:  Denies nausea, heartburn or change in bowel habits Skin: Denies abnormal skin rashes Lymphatics: Denies new lymphadenopathy or easy bruising Neurological:Denies numbness, tingling or new weaknesses Behavioral/Psych: Mood is stable, no new changes  All other systems were reviewed with the patient and are negative.  I have reviewed the past medical history, past surgical history, social history and family history with the patient and they are unchanged from previous note.  ALLERGIES:  is allergic to dilaudid [hydromorphone hcl]; morphine and related; and sudafed [pseudoephedrine hcl].  MEDICATIONS:  Current Outpatient Medications  Medication Sig Dispense Refill  . acetaminophen (TYLENOL) 325 MG tablet Take 975 mg by mouth every 6 (six) hours as needed (pain).    Marland Kitchen amoxicillin (AMOXIL) 500 MG capsule Take 500 mg by mouth 3 (three) times daily.    Marland Kitchen HYDROcodone-acetaminophen (NORCO/VICODIN) 5-325 MG tablet Take 1 tablet by mouth every 6 (six) hours as needed.    . lidocaine-prilocaine (EMLA) cream APP EXT AA 1 TIME    . magic mouthwash w/lidocaine SOLN Take 5 mLs by mouth 4 (four) times daily. Take 5 ml's 4 x daily for 7 days. Swish and spit. 140 mL 0  . methylPREDNISolone (MEDROL DOSEPAK) 4 MG TBPK tablet Take 4 mg by mouth See admin instructions. follow package directions    . ondansetron (ZOFRAN) 8 MG tablet Take 1 tablet (8 mg total) by mouth every 8 (eight) hours as needed. 30 tablet 1  .  prochlorperazine (COMPAZINE) 10 MG tablet Take 1 tablet (10 mg total) by mouth every 6 (six) hours as needed (Nausea or vomiting). 30 tablet 1  . promethazine (PHENERGAN) 25 MG suppository Place 1 suppository (25 mg total) rectally 2 (two) times daily as needed for nausea or vomiting. 12 each 0  . traMADol (ULTRAM) 50 MG tablet Take 50 mg by mouth 3 (three) times daily as needed.    . venlafaxine XR (EFFEXOR-XR) 37.5 MG 24  hr capsule TAKE 1 CAPSULE BY MOUTH DAILY WITH BREAKFAST (Patient taking differently: Take 37.5 mg by mouth daily with breakfast. ) 90 capsule 6   No current facility-administered medications for this visit.   Facility-Administered Medications Ordered in Other Visits  Medication Dose Route Frequency Provider Last Rate Last Admin  . sodium chloride flush (NS) 0.9 % injection 10 mL  10 mL Intravenous PRN Alvy Bimler, Sagan Wurzel, MD   10 mL at 02/11/17 0839  . sodium chloride flush (NS) 0.9 % injection 10 mL  10 mL Intravenous PRN Alvy Bimler, Durwin Davisson, MD   10 mL at 03/04/17 1510    PHYSICAL EXAMINATION: ECOG PERFORMANCE STATUS: 1 - Symptomatic but completely ambulatory  Vitals:   07/19/19 1008  BP: (!) 149/68  Pulse: 72  Resp: 18  Temp: 98.2 F (36.8 C)  SpO2: 100%   Filed Weights   07/19/19 1008  Weight: 174 lb 6.4 oz (79.1 kg)    GENERAL:alert, no distress and comfortable NEURO: alert & oriented x 3 with fluent speech, no focal motor/sensory deficits  LABORATORY DATA:  I have reviewed the data as listed    Component Value Date/Time   NA 142 07/19/2019 0951   NA 139 04/14/2017 0742   K 3.8 07/19/2019 0951   K 4.0 04/14/2017 0742   CL 107 07/19/2019 0951   CO2 23 07/19/2019 0951   CO2 21 (L) 04/14/2017 0742   GLUCOSE 163 (H) 07/19/2019 0951   GLUCOSE 247 (H) 04/14/2017 0742   BUN 9 07/19/2019 0951   BUN 13.1 04/14/2017 0742   CREATININE 0.87 07/19/2019 0951   CREATININE 0.9 04/14/2017 0742   CALCIUM 8.9 07/19/2019 0951   CALCIUM 9.2 04/14/2017 0742   PROT 6.7 07/19/2019 0951   PROT 7.3 04/14/2017 0742   ALBUMIN 3.6 07/19/2019 0951   ALBUMIN 3.9 04/14/2017 0742   AST 21 07/19/2019 0951   AST 17 04/14/2017 0742   ALT 13 07/19/2019 0951   ALT 14 04/14/2017 0742   ALKPHOS 74 07/19/2019 0951   ALKPHOS 70 04/14/2017 0742   BILITOT 0.3 07/19/2019 0951   BILITOT 0.37 04/14/2017 0742   GFRNONAA >60 07/19/2019 0951   GFRAA >60 07/19/2019 0951    No results found for: SPEP, UPEP  Lab  Results  Component Value Date   WBC 2.7 (L) 07/19/2019   NEUTROABS 1.1 (L) 07/19/2019   HGB 9.0 (L) 07/19/2019   HCT 27.8 (L) 07/19/2019   MCV 104.9 (H) 07/19/2019   PLT 290 07/19/2019      Chemistry      Component Value Date/Time   NA 142 07/19/2019 0951   NA 139 04/14/2017 0742   K 3.8 07/19/2019 0951   K 4.0 04/14/2017 0742   CL 107 07/19/2019 0951   CO2 23 07/19/2019 0951   CO2 21 (L) 04/14/2017 0742   BUN 9 07/19/2019 0951   BUN 13.1 04/14/2017 0742   CREATININE 0.87 07/19/2019 0951   CREATININE 0.9 04/14/2017 0742      Component Value Date/Time   CALCIUM 8.9  07/19/2019 0951   CALCIUM 9.2 04/14/2017 0742   ALKPHOS 74 07/19/2019 0951   ALKPHOS 70 04/14/2017 0742   AST 21 07/19/2019 0951   AST 17 04/14/2017 0742   ALT 13 07/19/2019 0951   ALT 14 04/14/2017 0742   BILITOT 0.3 07/19/2019 0951   BILITOT 0.37 04/14/2017 0097

## 2019-07-19 NOTE — Patient Instructions (Signed)

## 2019-07-19 NOTE — Telephone Encounter (Signed)
Scheduled appt per 3/29 sch message - pt aware .

## 2019-07-20 LAB — CA 125: Cancer Antigen (CA) 125: 16.7 U/mL (ref 0.0–38.1)

## 2019-07-20 NOTE — Progress Notes (Signed)
Pharmacist Chemotherapy Monitoring - Follow Up Assessment    I verify that I have reviewed each item in the below checklist:  . Regimen for the patient is scheduled for the appropriate day and plan matches scheduled date. Marland Kitchen Appropriate non-routine labs are ordered dependent on drug ordered. . If applicable, additional medications reviewed and ordered per protocol based on lifetime cumulative doses and/or treatment regimen.   Plan for follow-up and/or issues identified: Yes . I-vent associated with next due treatment: Yes . MD and/or nursing notified: No   Kennith Center, Pharm.D., CPP 07/20/2019@1 :31 PM

## 2019-07-26 ENCOUNTER — Other Ambulatory Visit: Payer: Self-pay

## 2019-07-26 ENCOUNTER — Inpatient Hospital Stay: Payer: BC Managed Care – PPO | Attending: Hematology and Oncology

## 2019-07-26 ENCOUNTER — Inpatient Hospital Stay: Payer: BC Managed Care – PPO

## 2019-07-26 VITALS — BP 148/72 | HR 80 | Temp 98.0°F | Resp 16

## 2019-07-26 DIAGNOSIS — Z90710 Acquired absence of both cervix and uterus: Secondary | ICD-10-CM | POA: Diagnosis not present

## 2019-07-26 DIAGNOSIS — Z5111 Encounter for antineoplastic chemotherapy: Secondary | ICD-10-CM | POA: Insufficient documentation

## 2019-07-26 DIAGNOSIS — Z90722 Acquired absence of ovaries, bilateral: Secondary | ICD-10-CM | POA: Diagnosis not present

## 2019-07-26 DIAGNOSIS — D61810 Antineoplastic chemotherapy induced pancytopenia: Secondary | ICD-10-CM | POA: Insufficient documentation

## 2019-07-26 DIAGNOSIS — C5701 Malignant neoplasm of right fallopian tube: Secondary | ICD-10-CM | POA: Insufficient documentation

## 2019-07-26 DIAGNOSIS — Z7189 Other specified counseling: Secondary | ICD-10-CM

## 2019-07-26 LAB — CBC WITH DIFFERENTIAL (CANCER CENTER ONLY)
Abs Immature Granulocytes: 0.02 10*3/uL (ref 0.00–0.07)
Basophils Absolute: 0 10*3/uL (ref 0.0–0.1)
Basophils Relative: 1 %
Eosinophils Absolute: 0 10*3/uL (ref 0.0–0.5)
Eosinophils Relative: 1 %
HCT: 29 % — ABNORMAL LOW (ref 36.0–46.0)
Hemoglobin: 9.2 g/dL — ABNORMAL LOW (ref 12.0–15.0)
Immature Granulocytes: 1 %
Lymphocytes Relative: 34 %
Lymphs Abs: 1.5 10*3/uL (ref 0.7–4.0)
MCH: 33.3 pg (ref 26.0–34.0)
MCHC: 31.7 g/dL (ref 30.0–36.0)
MCV: 105.1 fL — ABNORMAL HIGH (ref 80.0–100.0)
Monocytes Absolute: 0.8 10*3/uL (ref 0.1–1.0)
Monocytes Relative: 19 %
Neutro Abs: 2 10*3/uL (ref 1.7–7.7)
Neutrophils Relative %: 44 %
Platelet Count: 299 10*3/uL (ref 150–400)
RBC: 2.76 MIL/uL — ABNORMAL LOW (ref 3.87–5.11)
RDW: 15.9 % — ABNORMAL HIGH (ref 11.5–15.5)
WBC Count: 4.4 10*3/uL (ref 4.0–10.5)
nRBC: 0 % (ref 0.0–0.2)

## 2019-07-26 MED ORDER — SODIUM CHLORIDE 0.9 % IV SOLN
480.0000 mg | Freq: Once | INTRAVENOUS | Status: AC
  Administered 2019-07-26: 480 mg via INTRAVENOUS
  Filled 2019-07-26: qty 48

## 2019-07-26 MED ORDER — FAMOTIDINE IN NACL 20-0.9 MG/50ML-% IV SOLN
20.0000 mg | Freq: Once | INTRAVENOUS | Status: AC
  Administered 2019-07-26: 20 mg via INTRAVENOUS

## 2019-07-26 MED ORDER — SODIUM CHLORIDE 0.9 % IV SOLN
10.0000 mg | Freq: Once | INTRAVENOUS | Status: AC
  Administered 2019-07-26: 10 mg via INTRAVENOUS
  Filled 2019-07-26: qty 10

## 2019-07-26 MED ORDER — DOXORUBICIN HCL LIPOSOMAL CHEMO INJECTION 2 MG/ML
32.0000 mg/m2 | Freq: Once | INTRAVENOUS | Status: AC
  Administered 2019-07-26: 60 mg via INTRAVENOUS
  Filled 2019-07-26: qty 30

## 2019-07-26 MED ORDER — PALONOSETRON HCL INJECTION 0.25 MG/5ML
INTRAVENOUS | Status: AC
  Filled 2019-07-26: qty 5

## 2019-07-26 MED ORDER — DIPHENHYDRAMINE HCL 50 MG/ML IJ SOLN
INTRAMUSCULAR | Status: AC
  Filled 2019-07-26: qty 1

## 2019-07-26 MED ORDER — HEPARIN SOD (PORK) LOCK FLUSH 100 UNIT/ML IV SOLN
500.0000 [IU] | Freq: Once | INTRAVENOUS | Status: AC | PRN
  Administered 2019-07-26: 500 [IU]
  Filled 2019-07-26: qty 5

## 2019-07-26 MED ORDER — FAMOTIDINE IN NACL 20-0.9 MG/50ML-% IV SOLN
INTRAVENOUS | Status: AC
  Filled 2019-07-26: qty 50

## 2019-07-26 MED ORDER — SODIUM CHLORIDE 0.9 % IV SOLN
Freq: Once | INTRAVENOUS | Status: AC
  Filled 2019-07-26: qty 5

## 2019-07-26 MED ORDER — PALONOSETRON HCL INJECTION 0.25 MG/5ML
0.2500 mg | Freq: Once | INTRAVENOUS | Status: AC
  Administered 2019-07-26: 0.25 mg via INTRAVENOUS

## 2019-07-26 MED ORDER — SODIUM CHLORIDE 0.9% FLUSH
10.0000 mL | INTRAVENOUS | Status: DC | PRN
  Administered 2019-07-26: 10 mL
  Filled 2019-07-26: qty 10

## 2019-07-26 MED ORDER — SODIUM CHLORIDE 0.9% FLUSH
10.0000 mL | Freq: Once | INTRAVENOUS | Status: AC
  Administered 2019-07-26: 10 mL
  Filled 2019-07-26: qty 10

## 2019-07-26 MED ORDER — DIPHENHYDRAMINE HCL 50 MG/ML IJ SOLN
25.0000 mg | Freq: Once | INTRAMUSCULAR | Status: AC
  Administered 2019-07-26: 25 mg via INTRAVENOUS

## 2019-07-26 MED ORDER — DEXTROSE 5 % IV SOLN
Freq: Once | INTRAVENOUS | Status: AC
  Filled 2019-07-26: qty 250

## 2019-07-26 NOTE — Progress Notes (Signed)
Per MD - Okay to treat with CBC only today.

## 2019-08-10 ENCOUNTER — Telehealth: Payer: Self-pay

## 2019-08-10 NOTE — Telephone Encounter (Signed)
Called back and given below message. She verbalized understanding. She has front tooth that needs to be extracted, that is infected. She has started on Amoxicillin for 7 days. Extraction would not be until one day next week. Dentist office will fax over clearance form.

## 2019-08-10 NOTE — Telephone Encounter (Signed)
We have to time her extraction closer to her next cycle Is it urgent?

## 2019-08-10 NOTE — Telephone Encounter (Signed)
She called and left a message. She is asking is it okay to have another dental extraction?

## 2019-08-10 NOTE — Telephone Encounter (Signed)
Ok I will look out for it Might have to see her and recheck labs next week

## 2019-08-13 ENCOUNTER — Ambulatory Visit: Payer: BC Managed Care – PPO

## 2019-08-13 ENCOUNTER — Inpatient Hospital Stay: Payer: BC Managed Care – PPO

## 2019-08-13 ENCOUNTER — Other Ambulatory Visit: Payer: Self-pay

## 2019-08-13 ENCOUNTER — Inpatient Hospital Stay (HOSPITAL_BASED_OUTPATIENT_CLINIC_OR_DEPARTMENT_OTHER): Payer: BC Managed Care – PPO | Admitting: Hematology and Oncology

## 2019-08-13 ENCOUNTER — Encounter: Payer: Self-pay | Admitting: Hematology and Oncology

## 2019-08-13 DIAGNOSIS — K0889 Other specified disorders of teeth and supporting structures: Secondary | ICD-10-CM | POA: Diagnosis not present

## 2019-08-13 DIAGNOSIS — D61818 Other pancytopenia: Secondary | ICD-10-CM

## 2019-08-13 DIAGNOSIS — Z90722 Acquired absence of ovaries, bilateral: Secondary | ICD-10-CM | POA: Diagnosis not present

## 2019-08-13 DIAGNOSIS — C5701 Malignant neoplasm of right fallopian tube: Secondary | ICD-10-CM | POA: Diagnosis not present

## 2019-08-13 DIAGNOSIS — Z90710 Acquired absence of both cervix and uterus: Secondary | ICD-10-CM | POA: Diagnosis not present

## 2019-08-13 DIAGNOSIS — Z5111 Encounter for antineoplastic chemotherapy: Secondary | ICD-10-CM | POA: Diagnosis not present

## 2019-08-13 DIAGNOSIS — D61810 Antineoplastic chemotherapy induced pancytopenia: Secondary | ICD-10-CM | POA: Diagnosis not present

## 2019-08-13 LAB — CBC WITH DIFFERENTIAL (CANCER CENTER ONLY)
Abs Immature Granulocytes: 0.01 10*3/uL (ref 0.00–0.07)
Basophils Absolute: 0 10*3/uL (ref 0.0–0.1)
Basophils Relative: 1 %
Eosinophils Absolute: 0 10*3/uL (ref 0.0–0.5)
Eosinophils Relative: 1 %
HCT: 27.1 % — ABNORMAL LOW (ref 36.0–46.0)
Hemoglobin: 8.9 g/dL — ABNORMAL LOW (ref 12.0–15.0)
Immature Granulocytes: 1 %
Lymphocytes Relative: 45 %
Lymphs Abs: 1 10*3/uL (ref 0.7–4.0)
MCH: 33.3 pg (ref 26.0–34.0)
MCHC: 32.8 g/dL (ref 30.0–36.0)
MCV: 101.5 fL — ABNORMAL HIGH (ref 80.0–100.0)
Monocytes Absolute: 0.2 10*3/uL (ref 0.1–1.0)
Monocytes Relative: 9 %
Neutro Abs: 0.9 10*3/uL — ABNORMAL LOW (ref 1.7–7.7)
Neutrophils Relative %: 43 %
Platelet Count: 91 10*3/uL — ABNORMAL LOW (ref 150–400)
RBC: 2.67 MIL/uL — ABNORMAL LOW (ref 3.87–5.11)
RDW: 13.9 % (ref 11.5–15.5)
WBC Count: 2.1 10*3/uL — ABNORMAL LOW (ref 4.0–10.5)
nRBC: 0 % (ref 0.0–0.2)

## 2019-08-13 NOTE — Assessment & Plan Note (Signed)
Her loose tooth is causing pain and difficulties with eating However, due to pancytopenia, she is not ready to have dental extraction I recommend her to call her dental's office to determine the next available date after May 3rd I will schedule her to return closer to the time of her surgery to get repeat CBC and I will fill out her dental clearance form for her to proceed once I am able to confirm that her pancytopenia has resolved

## 2019-08-13 NOTE — Assessment & Plan Note (Signed)
She is not due to and start the next cycle of treatment until the first week of May Due to anticipated dental extraction, this will likely be delay until the following week For now, I do not plan to change her appointment until I hear back from her and her surgeon's office I reassured the patient that delaying her chemotherapy by 1 week will not jeopardize her health

## 2019-08-13 NOTE — Progress Notes (Signed)
[place] OFFICE PROGRESS NOTE  Patient Care Team: Gaynelle Arabian, MD as PCP - General (Family Medicine) Heath Lark, MD as Consulting Physician (Hematology and Oncology) Everitt Amber, MD as Consulting Physician (Obstetrics and Gynecology)  ASSESSMENT & PLAN:  Adenocarcinoma of right fallopian tube [facility]) She is not due to and start the next cycle of treatment until the first week of May Due to anticipated dental extraction, this will likely be delay until the following week For now, I do not plan to change her appointment until I hear back from her and her surgeon's office I reassured the patient that delaying her chemotherapy by 1 week will not jeopardize her health  Pancytopenia, acquired [facility]) She has acquired pancytopenia due to recent chemotherapy She is not symptomatic We discussed neutropenic precaution She is not ready to proceed with dental extraction until her [place] is at least 1.5  Tooth loose Her loose tooth is causing pain and difficulties with eating However, due to pancytopenia, she is not ready to have dental extraction I recommend her to call her dental's office to determine the next available date after May 3rd I will schedule her to return closer to the time of her surgery to get repeat CBC and I will fill out her dental clearance form for her to proceed once I am able to confirm that her pancytopenia has resolved   No orders of the defined types were placed in this encounter.   All questions were answered. The patient knows to call the clinic with any problems, questions or concerns. The total time spent in the appointment was 20 minutes encounter with patients including review of chart and various tests results, discussions about plan of care and coordination of care plan   Heath Lark, MD 08/13/2019 2:03 PM  INTERVAL HISTORY: Please see below for problem oriented charting. She is seen urgently today for dental clearance to proceed for tooth  removal at the front of her upper jaw That tooth used to have a crown over it and recently, they have become loose According to further evaluation by her dentist, the tooth needs to be extracted Due to recent chemotherapy, I am seeing her today to get further information and clearance to proceed She tolerated last cycle of treatment well except for occasional nausea She denies constipation No fever or chills The loose tooth is causing trouble with eating The patient denies any recent signs or symptoms of bleeding such as spontaneous epistaxis, hematuria or hematochezia.   SUMMARY OF ONCOLOGIC HISTORY: Oncology History Overview Note  Neg genetics   Adenocarcinoma of right fallopian tube ([place])  08/12/2016 Initial Diagnosis   The patient has many months of vague symptoms of fullness in the pelvis, urinary frequency and difficulty with defecation. She denies vaginal bleeding or rectal bleeding.    08/12/2016 Imaging   Abdomen X-ray in the ER: Moderate colonic stool burden without evidence of enteric obstruction   11/13/2016 Imaging   TVUS was performed on 11/13/16 which showed a large hypoechoic lobular mass seen posterior to the uterus measuring 18.2x16.1x11.8cm, blood flow was seen along the periphery of the mass. It was considered to be likely to be a fibroid vs pelvic mass vs ovarian mass. Neither ovary was removed. Moderate amount of free fluid in the pelvis. THe uterus measures 4x10x4cm. The endometrium is 48m.    12/05/2016 Imaging   MR pelvis 1. Mild limitations as detailed above. 2. Heterogeneous pelvic mass is favored to arise from the right ovary. Favor a  solid ovarian neoplasm such as fibroma/Brenner's tumor. Given lesion size and heterogeneous T2 signal out, complicating torsion cannot be excluded. 3. Moderate abdominopelvic ascites, without specific evidence of peritoneal metastasis. Consider further evaluation with contrast-enhanced abdominopelvic CT.    12/26/2016 Pathology  Results   1. Uterus, ovaries and fallopian tubes - ADENOCARCINOMA INVOLVING RIGHT FALLOPIAN TUBE, RIGHT AND LEFT OVARIES, UTERINE SEROSA AND PELVIC MASS. - SEE ONCOLOGY TABLE AND COMMENT. - UTERINE CERVIX, ENDOMETRIUM, MYOMETRIUM AND LEFT FALLOPIAN TUBE FREE OF TUMOR. 2. Omentum, resection for tumor - ADENOCARCINOMA. Microscopic Comment 1. ONCOLOGY TABLE - FALLOPIAN TUBE. SEE COMMENT 1. Specimen, including laterality: Uterus, bilateral adnexa, pelvic mass and omentum. 2. Procedure: Hysterectomy with bilateral salpingo-oophorectomy, pelvic mass excision and omentectomy. 3. Lymph node sampling performed: No 4. Tumor site: See comment. 5. Tumor location in fallopian tube: Right fallopian tube fimbria 6. Specimen integrity (intact/ruptured/disrupted): Intact 7. Tumor size (cm): 18 cm, see comment. 8. Histologic type: Adenocarcinoma 9. Grade: High grade 10. Microscopic tumor extension: Tumor involves right fallopian tube, right and left ovaries, uterine serosa, pelvic mass and omentum. 11. Margins: See comment. 12. Lymph-Vascular invasion: Present 13. Lymph nodes: # examined: 0; # positive: N/A 14. TNM: pT3c, pNX 15. FIGO Stage (based on pathologic findings, needs clinical correlation: III-C 16. Comments: There is an 18 cm pelvic mass which is a high grade adenocarcinoma and there is a 12.2 cm  segment of omentum which is extensively involved with adenocarcinoma. The tumor also involves the fimbria of the right fallopian tube and the parenchyma of the right and left ovaries as well as uterine serosa. The fimbria of the right fallopian has intraepithelial atypia consistent with a precursor lesion and therefore this is most consistent with primary fallopian tube adenocarcinoma. A primary peritoneal serous carcinoma is a less likely possibility.    12/26/2016 Pathology Results   PERITONEAL/ASCITIC FLUID(SPECIMEN 1 OF 1 COLLECTED 12/26/16): POORLY DIFFERENTIATED ADENOCARCINOMA   12/26/2016 Surgery    Procedure(s) Performed: Exploratory laparotomy with total abdominal hysterectomy, bilateral salpingo-oophorectomy, omentectomy radical tumor debulking for ovarian cancer .  Surgeon: Thereasa Solo, MD.   Operative Findings: 20cm left ovarian mass densely adherent to the posterior uterus, right tube and ovary, cervix, sigmoid colon. 10cm omental cake. 4L ascites. 60m size tumor nodules on the serosa of the terminal ileum and proximal sigmoid colon. 165mnodules on right diaphragm.    This represented an optimal cytoreduction (R1) with [id number] on intestine and diaphragm representing gross visible disease   01/16/2017 Procedure   Placement of single lumen port a cath via right internal jugular vein. The catheter tip lies at the cavo-atrial junction. A power injectable port a cath was placed and is ready for immediate use.   01/17/2017 Imaging   1. 3.5 x 7.2 x 4.6 cm fluid collection along the vaginal cuff, posterior the bladder and extending into the right adnexal space. This lesion demonstrates rim enhancement. Imaging features could be related to a loculated postoperative seroma or hematoma. Superinfection cannot be excluded by CT. Given debulking surgery was 3 weeks ago, this entire structure is un likely to represent neoplasm, but peritoneal involvement could have this appearance. 2. Small fluid collection in the left para colic gutter without rim enhancement. 3. Irregular/nodular appearance of the peritoneal M in the anatomic pelvis with areas of subtle nodularity between the stomach and the spleen. Close attention in these regions on follow-up recommended as metastatic disease is a concern. 4. Mildly enlarged hepatoduodenal ligament lymph node. Attention on follow-up recommended.  01/20/2017 Tumor Marker   Patient's tumor was tested for the following markers: CA-125 Results of the tumor marker test revealed 46.6   01/28/2017 Genetic Testing       Negative genetic testing on the [facility] panel.  The [facility] gene panel offered by [employer] includes sequencing and deletion/duplication testing of the following 28 genes: APC, ATM, BARD1, BMPR1A, BRCA1, BRCA2, BRIP1, CHD1, CDK4, CDKN2A, CHEK2, EPCAM (large rearrangement only), MLH1, MSH2, MSH6, MUTYH, NBN, PALB2, PMS2, PTEN, RAD51C, RAD51D, SMAD4, STK11, and TP53. Sequencing was performed for select regions of POLE and POLD1, and large rearrangement analysis was performed for select regions of GREM1. The report date is January 28, 2017.  HRD testing looking for genomic instability and BRCA mutations was negative.  The report date of this test is January 27, 2017.    01/31/2017 Tumor Marker   Patient's tumor was tested for the following markers: CA-125 Results of the tumor marker test revealed 22.4   03/04/2017 Tumor Marker   Patient's tumor was tested for the following markers: CA-125 Results of the tumor marker test revealed 13.8   04/18/2017 Tumor Marker   Patient's tumor was tested for the following markers: CA-125 Results of the tumor marker test revealed 11.9   05/05/2017 Tumor Marker   Patient's tumor was tested for the following markers: CA-125 Results of the tumor marker test revealed 12   05/26/2017 Tumor Marker   Patient's tumor was tested for the following markers: CA-125 Results of the tumor marker test revealed 10.6   05/26/2017 Imaging   1. A previously noted postoperative fluid collection in the low pelvis and residual ascites seen on the prior examination has resolved on today's study. No findings to suggest residual/recurrent disease on today's examination. No definite solid organ metastasis identified in the abdomen or pelvis. No lymphadenopathy. 2. Aortic atherosclerosis.   06/10/2017 Procedure   Successful right IJ vein Port-A-Cath explant.   03/09/2018 Tumor Marker   Patient's tumor was tested for the following markers: CA-125 Results of the tumor marker test revealed 12.1    05/26/2018 Tumor Marker   Patient's tumor was tested for the following markers: CA-125 Results of the tumor marker test revealed 13.8   09/09/2018 Tumor Marker   Patient's tumor was tested for the following markers: CA-125 Results of the tumor marker test revealed 23.9   12/18/2018 Tumor Marker   Patient's tumor was tested for the following markers: CA-125 Results of the tumor marker test revealed 43.8   02/27/2019 - 02/28/2019 Hospital Admission   She was admitted for bowel obstruction   02/27/2019 Imaging   1. High-grade small bowel obstruction with transition point in the pelvis in an area of suspected desmoplastic reaction surrounding a 1.3 cm spiculated nodule in the mesentery, suspicious for carcinoid. There is hyperenhancement near the tip of the appendix and loss of the normal fat plane between it and the adjacent sigmoid colon, potentially the site of primary lesion. 2. Multiple new small subcentimeter hyperdense lesions scattered along the liver capsule, concerning for metastases. Enlarged hyperenhancing gastrohepatic and portacaval lymph nodes concerning for nodal metastases. 3. Very mild right hydroureteronephrosis to the level of the desmoplastic reaction in the pelvis, potentially involving the right ureter. 4. Infraumbilical ventral abdominal wall diastasis containing a small nondilated portion of transverse colon. 5. Trace ascites. 6. Cholelithiasis. 7.  Aortic atherosclerosis (ICD10-I70.0).     03/05/2019 Tumor Marker   Patient's tumor was tested for the following markers: CA-125 Results of the tumor marker  test revealed 70.1   03/12/2019 Echocardiogram   IMPRESSIONS     1. Left ventricular ejection fraction, by visual estimation, is 60 to 65%. The left ventricle has normal function. There is no left ventricular hypertrophy.  2. Left ventricular diastolic parameters are consistent with Grade I diastolic dysfunction (impaired relaxation).  3. Global right ventricle has  normal systolic function.The right ventricular size is normal. No increase in right ventricular wall thickness.  4. Left atrial size was normal.  5. Right atrial size was normal.  6. The pericardium was not assessed.  7. The mitral valve is normal in structure. No evidence of mitral valve regurgitation.  8. The tricuspid valve is normal in structure. Tricuspid valve regurgitation is trivial.  9. The aortic valve is normal in structure. Aortic valve regurgitation is not visualized. 10. The pulmonic valve was grossly normal. Pulmonic valve regurgitation is not visualized. 11. The average left ventricular global longitudinal strain is -17.6 %.   03/16/2019 Procedure   Successful placement of a right internal jugular approach power injectable Port-A-Cath. The catheter is ready for immediate use.     04/19/2019 Tumor Marker   Patient's tumor was tested for the following markers: CA-125 Results of the tumor marker test revealed 39.8   05/17/2019 Tumor Marker   Patient's tumor was tested for the following markers: CA-125 Results of the tumor marker test revealed 27   05/28/2019 Tumor Marker   Patient's tumor was tested for the following markers: CA-125 Results of the tumor marker test revealed 27.7.   06/11/2019 Imaging   1. No new or progressive metastatic disease in the abdomen or pelvis. 2. Tiny noncalcified perisplenic implant is decreased. Calcified pericardiophrenic, retroperitoneal and bilateral inguinal lymph nodes and scattered small calcified perihepatic and perisplenic implants are stable. 3.  Aortic Atherosclerosis (ICD10-I70.0).       06/17/2019 Echocardiogram    1. Left ventricular ejection fraction, by estimation, is 65 to 70%. The left ventricle has normal function. The left ventricle has no regional wall motion abnormalities. Left ventricular diastolic parameters were normal.  2. Right ventricular systolic function is normal. The right ventricular size is normal.  3. The  mitral valve is normal in structure and function. No evidence of mitral valve regurgitation. No evidence of mitral stenosis.  4. The aortic valve is normal in structure and function. Aortic valve regurgitation is not visualized. No aortic stenosis is present.   06/21/2019 Tumor Marker   Patient's tumor was tested for the following markers: CA-125 Results of the tumor marker test revealed 18.7   07/19/2019 Tumor Marker   Patient's tumor was tested for the following markers: CA-125 Results of the tumor marker test revealed 16.7     REVIEW OF SYSTEMS:   Constitutional: Denies fevers, chills or abnormal weight loss Eyes: Denies blurriness of vision Ears, nose, mouth, throat, and face: Denies mucositis or sore throat Respiratory: Denies cough, dyspnea or wheezes Cardiovascular: Denies palpitation, chest discomfort or lower extremity swelling Skin: Denies abnormal skin rashes Lymphatics: Denies new lymphadenopathy or easy bruising Neurological:Denies numbness, tingling or new weaknesses Behavioral/Psych: Mood is stable, no new changes  All other systems were reviewed with the patient and are negative.  I have reviewed the past medical history, past surgical history, social history and family history with the patient and they are unchanged from previous note.  ALLERGIES:  is allergic to dilaudid [hydromorphone hcl]; morphine and related; and sudafed [pseudoephedrine hcl].  MEDICATIONS:  Current Outpatient Medications  Medication Sig Dispense Refill  .  acetaminophen (TYLENOL) 325 MG tablet Take 975 mg by mouth every 6 (six) hours as needed (pain).    Marland Kitchen amoxicillin (AMOXIL) 500 MG capsule Take 500 mg by mouth 3 (three) times daily.    Marland Kitchen HYDROcodone-acetaminophen (NORCO/VICODIN) 5-325 MG tablet Take 1 tablet by mouth every 6 (six) hours as needed.    . lidocaine-prilocaine (EMLA) cream APP EXT AA 1 TIME    . magic mouthwash w/lidocaine SOLN Take 5 mLs by mouth 4 (four) times daily. Take 5 ml's  4 x daily for 7 days. Swish and spit. 140 mL 0  . methylPREDNISolone (MEDROL DOSEPAK) 4 MG TBPK tablet Take 4 mg by mouth See admin instructions. follow package directions    . ondansetron (ZOFRAN) 8 MG tablet Take 1 tablet (8 mg total) by mouth every 8 (eight) hours as needed. 30 tablet 1  . prochlorperazine (COMPAZINE) 10 MG tablet Take 1 tablet (10 mg total) by mouth every 6 (six) hours as needed (Nausea or vomiting). 30 tablet 1  . promethazine (PHENERGAN) 25 MG suppository Place 1 suppository (25 mg total) rectally 2 (two) times daily as needed for nausea or vomiting. 12 each 0  . traMADol (ULTRAM) 50 MG tablet Take 50 mg by mouth 3 (three) times daily as needed.    . venlafaxine XR (EFFEXOR-XR) 37.5 MG 24 hr capsule TAKE 1 CAPSULE BY MOUTH DAILY WITH BREAKFAST (Patient taking differently: Take 37.5 mg by mouth daily with breakfast. ) 90 capsule 6   No current facility-administered medications for this visit.   Facility-Administered Medications Ordered in Other Visits  Medication Dose Route Frequency Provider Last Rate Last Admin  . sodium chloride flush (NS) 0.9 % injection 10 mL  10 mL Intravenous PRN Alvy Bimler, Jamala Kohen, MD   10 mL at 02/11/17 0839  . sodium chloride flush (NS) 0.9 % injection 10 mL  10 mL Intravenous PRN Heath Lark, MD   10 mL at 03/04/17 1510    PHYSICAL EXAMINATION: ECOG PERFORMANCE STATUS: 1 - Symptomatic but completely ambulatory  Vitals:   08/13/19 1210  BP: (!) 152/74  Pulse: 82  Resp: 20  Temp: 98.3 F (36.8 C)  SpO2: 100%   Filed Weights   08/13/19 1210  Weight: 174 lb (78.9 kg)    GENERAL:alert, no distress and comfortable  LABORATORY DATA:  I have reviewed the data as listed    Component Value Date/Time   NA 142 07/19/2019 0951   NA 139 04/14/2017 0742   K 3.8 07/19/2019 0951   K 4.0 04/14/2017 0742   CL 107 07/19/2019 0951   CO2 23 07/19/2019 0951   CO2 21 (L) 04/14/2017 0742   GLUCOSE 163 (H) 07/19/2019 0951   GLUCOSE 247 (H) 04/14/2017  0742   BUN 9 07/19/2019 0951   BUN 13.1 04/14/2017 0742   CREATININE 0.87 07/19/2019 0951   CREATININE 0.9 04/14/2017 0742   CALCIUM 8.9 07/19/2019 0951   CALCIUM 9.2 04/14/2017 0742   PROT 6.7 07/19/2019 0951   PROT 7.3 04/14/2017 0742   ALBUMIN 3.6 07/19/2019 0951   ALBUMIN 3.9 04/14/2017 0742   AST 21 07/19/2019 0951   AST 17 04/14/2017 0742   ALT 13 07/19/2019 0951   ALT 14 04/14/2017 0742   ALKPHOS 74 07/19/2019 0951   ALKPHOS 70 04/14/2017 0742   BILITOT 0.3 07/19/2019 0951   BILITOT 0.37 04/14/2017 0742   GFRNONAA >60 07/19/2019 0951   GFRAA >60 07/19/2019 0951    No results found for: SPEP, UPEP  Lab Results  Component Value Date   WBC 2.1 (L) 08/13/2019   NEUTROABS 0.9 (L) 08/13/2019   HGB 8.9 (L) 08/13/2019   HCT 27.1 (L) 08/13/2019   MCV 101.5 (H) 08/13/2019   PLT 91 (L) 08/13/2019      Chemistry      Component Value Date/Time   NA 142 07/19/2019 0951   NA 139 04/14/2017 0742   K 3.8 07/19/2019 0951   K 4.0 04/14/2017 0742   CL 107 07/19/2019 0951   CO2 23 07/19/2019 0951   CO2 21 (L) 04/14/2017 0742   BUN 9 07/19/2019 0951   BUN 13.1 04/14/2017 0742   CREATININE 0.87 07/19/2019 0951   CREATININE 0.9 04/14/2017 0742      Component Value Date/Time   CALCIUM 8.9 07/19/2019 0951   CALCIUM 9.2 04/14/2017 0742   ALKPHOS 74 07/19/2019 0951   ALKPHOS 70 04/14/2017 0742   AST 21 07/19/2019 0951   AST 17 04/14/2017 0742   ALT 13 07/19/2019 0951   ALT 14 04/14/2017 0742   BILITOT 0.3 07/19/2019 0951   BILITOT 0.37 04/14/2017 6548

## 2019-08-13 NOTE — Assessment & Plan Note (Signed)
She has acquired pancytopenia due to recent chemotherapy She is not symptomatic We discussed neutropenic precaution She is not ready to proceed with dental extraction until her [place] is at least 1.5

## 2019-08-16 ENCOUNTER — Other Ambulatory Visit: Payer: Self-pay | Admitting: Hematology and Oncology

## 2019-08-16 DIAGNOSIS — C5701 Malignant neoplasm of right fallopian tube: Secondary | ICD-10-CM

## 2019-08-16 MED FILL — PROCHLORPERAZINE 10 MG TAB: 10 | 7 days supply | Qty: 30 | Fill #0

## 2019-08-17 ENCOUNTER — Telehealth: Payer: Self-pay

## 2019-08-17 NOTE — Telephone Encounter (Signed)
-----   Message from Heath Lark, MD sent at 08/17/2019  8:00 AM EDT ----- Regarding: she is supposed to call dentist to find out when they can operate. any news?

## 2019-08-17 NOTE — Telephone Encounter (Signed)
Called and given below message. She verbalized understanding. She is still waiting on appt. She will call them back and call the office when she gets appt.

## 2019-08-17 NOTE — Progress Notes (Signed)

## 2019-08-18 ENCOUNTER — Telehealth: Payer: Self-pay

## 2019-08-18 NOTE — Telephone Encounter (Signed)
She called. She has appt for dental procedure schedule now for 5/5 at 1030.

## 2019-08-19 ENCOUNTER — Telehealth: Payer: Self-pay

## 2019-08-19 NOTE — Telephone Encounter (Signed)
TC to Pt. To inform her that Dr Tennis Ship will see her on 5/3 for just labs and office visit. Appointment times discussed. Pt. verbalized understanding.

## 2019-08-19 NOTE — Telephone Encounter (Signed)
Porsche,  Please call her back I will see her on 5/3 but pls proceed to cancel chemo that day and flush appt. We will just draw labs from her arm  thanks

## 2019-08-23 ENCOUNTER — Telehealth: Payer: Self-pay

## 2019-08-23 ENCOUNTER — Inpatient Hospital Stay: Payer: BC Managed Care – PPO

## 2019-08-23 ENCOUNTER — Other Ambulatory Visit: Payer: Self-pay | Admitting: Hematology and Oncology

## 2019-08-23 ENCOUNTER — Other Ambulatory Visit: Payer: Self-pay

## 2019-08-23 ENCOUNTER — Telehealth: Payer: Self-pay | Admitting: Hematology and Oncology

## 2019-08-23 ENCOUNTER — Encounter: Payer: Self-pay | Admitting: Hematology and Oncology

## 2019-08-23 ENCOUNTER — Inpatient Hospital Stay: Payer: BC Managed Care – PPO | Attending: Hematology and Oncology | Admitting: Hematology and Oncology

## 2019-08-23 VITALS — BP 128/54 | HR 71 | Temp 98.0°F | Resp 18 | Ht 65.0 in | Wt 170.0 lb

## 2019-08-23 DIAGNOSIS — K047 Periapical abscess without sinus: Secondary | ICD-10-CM

## 2019-08-23 DIAGNOSIS — Z5111 Encounter for antineoplastic chemotherapy: Secondary | ICD-10-CM | POA: Insufficient documentation

## 2019-08-23 DIAGNOSIS — C5701 Malignant neoplasm of right fallopian tube: Secondary | ICD-10-CM | POA: Diagnosis not present

## 2019-08-23 DIAGNOSIS — Z90722 Acquired absence of ovaries, bilateral: Secondary | ICD-10-CM | POA: Insufficient documentation

## 2019-08-23 DIAGNOSIS — D61818 Other pancytopenia: Secondary | ICD-10-CM | POA: Diagnosis not present

## 2019-08-23 DIAGNOSIS — Z90710 Acquired absence of both cervix and uterus: Secondary | ICD-10-CM | POA: Insufficient documentation

## 2019-08-23 DIAGNOSIS — C562 Malignant neoplasm of left ovary: Secondary | ICD-10-CM

## 2019-08-23 DIAGNOSIS — K0889 Other specified disorders of teeth and supporting structures: Secondary | ICD-10-CM | POA: Diagnosis not present

## 2019-08-23 LAB — CBC WITH DIFFERENTIAL (CANCER CENTER ONLY)
Abs Immature Granulocytes: 0.01 10*3/uL (ref 0.00–0.07)
Basophils Absolute: 0 10*3/uL (ref 0.0–0.1)
Basophils Relative: 1 %
Eosinophils Absolute: 0 10*3/uL (ref 0.0–0.5)
Eosinophils Relative: 1 %
HCT: 30.3 % — ABNORMAL LOW (ref 36.0–46.0)
Hemoglobin: 9.6 g/dL — ABNORMAL LOW (ref 12.0–15.0)
Immature Granulocytes: 0 %
Lymphocytes Relative: 45 %
Lymphs Abs: 1.2 10*3/uL (ref 0.7–4.0)
MCH: 32.7 pg (ref 26.0–34.0)
MCHC: 31.7 g/dL (ref 30.0–36.0)
MCV: 103.1 fL — ABNORMAL HIGH (ref 80.0–100.0)
Monocytes Absolute: 0.4 10*3/uL (ref 0.1–1.0)
Monocytes Relative: 16 %
Neutro Abs: 1 10*3/uL — ABNORMAL LOW (ref 1.7–7.7)
Neutrophils Relative %: 37 %
Platelet Count: 245 10*3/uL (ref 150–400)
RBC: 2.94 MIL/uL — ABNORMAL LOW (ref 3.87–5.11)
RDW: 16.2 % — ABNORMAL HIGH (ref 11.5–15.5)
WBC Count: 2.8 10*3/uL — ABNORMAL LOW (ref 4.0–10.5)
nRBC: 0 % (ref 0.0–0.2)

## 2019-08-23 MED ORDER — AMOXICILLIN 500 MG PO CAPS: 500.0000 mg | ORAL_CAPSULE | Freq: Two times a day (BID) | ORAL | 0 refills | Status: DC

## 2019-08-23 MED FILL — AMOXICILLIN 500 MG CAPSULE: 500 | 5 days supply | Qty: 10 | Fill #0

## 2019-08-23 NOTE — Assessment & Plan Note (Signed)
She has slow recovery of her blood count I plan to reduce the dose of Doxil further I will keep carboplatin at maximum of 480 mg  I will delay her chemotherapy to next week to allow recovery from dental extraction After that, she is instructed to get CT imaging scheduled on June 4 I will see her on June 7 for further evaluation and review of plan of care

## 2019-08-23 NOTE — Telephone Encounter (Signed)
Called and given below message. She verbalized understanding. Echo scheduled for 5/25 at 10 am, arrive at 0945 to [facility]. She verbalized  understanding.

## 2019-08-23 NOTE — Assessment & Plan Note (Signed)
At present time, there is no contraindication for her to proceed with dental extraction but I would prefer to cover her with a course of therapy with antibiotics She is in agreement

## 2019-08-23 NOTE — Assessment & Plan Note (Signed)
She is still pancytopenic but ANC has improved I recommend she proceed with dental extraction as scheduled I have provided and completed clearance for her to proceed I plan to cover her with 5 days course of amoxicillin to reduce the risk of infection

## 2019-08-23 NOTE — Telephone Encounter (Signed)
-----   Message from Heath Lark, MD sent at 08/23/2019 12:04 PM EDT ----- Regarding: echo I forgot to tell her she needs repeat ECHo at the end of the month Please help schedule Any time after 5/25

## 2019-08-23 NOTE — Telephone Encounter (Signed)
Scheduled appts per 4/30 sch msg. Pt confirmed appt date and time.

## 2019-08-23 NOTE — Progress Notes (Signed)
[place] OFFICE PROGRESS NOTE  Patient Care Team: Gaynelle Arabian, MD as PCP - General (Family Medicine) Heath Lark, MD as Consulting Physician (Hematology and Oncology) Everitt Amber, MD as Consulting Physician (Obstetrics and Gynecology)  ASSESSMENT & PLAN:  Adenocarcinoma of right fallopian tube [facility]) She has slow recovery of her blood count I plan to reduce the dose of Doxil further I will keep carboplatin at maximum of 480 mg  I will delay her chemotherapy to next week to allow recovery from dental extraction After that, she is instructed to get CT imaging scheduled on June 4 I will see her on June 7 for further evaluation and review of plan of care   Pancytopenia, acquired [facility]) She is still pancytopenic but ANC has improved I recommend she proceed with dental extraction as scheduled I have provided and completed clearance for her to proceed I plan to cover her with 5 days course of amoxicillin to reduce the risk of infection  Tooth loose At present time, there is no contraindication for her to proceed with dental extraction but I would prefer to cover her with a course of therapy with antibiotics She is in agreement   Orders Placed This Encounter  Procedures  . CT ABDOMEN PELVIS W CONTRAST    Standing Status:   Future    Standing Expiration Date:   08/22/2020    Order Specific Question:   If indicated for the ordered procedure, I authorize the administration of contrast media per Radiology protocol    Answer:   Yes    Order Specific Question:   Preferred imaging location?    Answer:   [facility]    Order Specific Question:   Radiology Contrast Protocol - do NOT remove file path    Answer:   [email or web address]    All questions were answered. The patient knows to call the clinic with any problems, questions or concerns. The total time spent in the appointment was 30 minutes encounter with patients including review of  chart and various tests results, discussions about plan of care and coordination of care plan   Heath Lark, MD 08/23/2019 12:02 PM  INTERVAL HISTORY: Please see below for problem oriented charting. She returns with her husband for further follow-up She also requested medical clearance to proceed with dental extraction in 2 days She is feeling well No recent fever or chills She continues to have mild tooth pain No recent nausea vomiting No recent infection, fever or chills The patient denies any recent signs or symptoms of bleeding such as spontaneous epistaxis, hematuria or hematochezia.  SUMMARY OF ONCOLOGIC HISTORY: Oncology History Overview Note  Neg genetics   Adenocarcinoma of right fallopian tube ([place])  08/12/2016 Initial Diagnosis   The patient has many months of vague symptoms of fullness in the pelvis, urinary frequency and difficulty with defecation. She denies vaginal bleeding or rectal bleeding.    08/12/2016 Imaging   Abdomen X-ray in the ER: Moderate colonic stool burden without evidence of enteric obstruction   11/13/2016 Imaging   TVUS was performed on 11/13/16 which showed a large hypoechoic lobular mass seen posterior to the uterus measuring 18.2x16.1x11.8cm, blood flow was seen along the periphery of the mass. It was considered to be likely to be a fibroid vs pelvic mass vs ovarian mass. Neither ovary was removed. Moderate amount of free fluid in the pelvis. THe uterus measures 4x10x4cm. The endometrium is 53m.    12/05/2016 Imaging   MR pelvis 1. Mild limitations  as detailed above. 2. Heterogeneous pelvic mass is favored to arise from the right ovary. Favor a solid ovarian neoplasm such as fibroma/Brenner's tumor. Given lesion size and heterogeneous T2 signal out, complicating torsion cannot be excluded. 3. Moderate abdominopelvic ascites, without specific evidence of peritoneal metastasis. Consider further evaluation with contrast-enhanced abdominopelvic CT.     12/26/2016 Pathology Results   1. Uterus, ovaries and fallopian tubes - ADENOCARCINOMA INVOLVING RIGHT FALLOPIAN TUBE, RIGHT AND LEFT OVARIES, UTERINE SEROSA AND PELVIC MASS. - SEE ONCOLOGY TABLE AND COMMENT. - UTERINE CERVIX, ENDOMETRIUM, MYOMETRIUM AND LEFT FALLOPIAN TUBE FREE OF TUMOR. 2. Omentum, resection for tumor - ADENOCARCINOMA. Microscopic Comment 1. ONCOLOGY TABLE - FALLOPIAN TUBE. SEE COMMENT 1. Specimen, including laterality: Uterus, bilateral adnexa, pelvic mass and omentum. 2. Procedure: Hysterectomy with bilateral salpingo-oophorectomy, pelvic mass excision and omentectomy. 3. Lymph node sampling performed: No 4. Tumor site: See comment. 5. Tumor location in fallopian tube: Right fallopian tube fimbria 6. Specimen integrity (intact/ruptured/disrupted): Intact 7. Tumor size (cm): 18 cm, see comment. 8. Histologic type: Adenocarcinoma 9. Grade: High grade 10. Microscopic tumor extension: Tumor involves right fallopian tube, right and left ovaries, uterine serosa, pelvic mass and omentum. 11. Margins: See comment. 12. Lymph-Vascular invasion: Present 13. Lymph nodes: # examined: 0; # positive: N/A 14. TNM: pT3c, pNX 15. FIGO Stage (based on pathologic findings, needs clinical correlation: III-C 16. Comments: There is an 18 cm pelvic mass which is a high grade adenocarcinoma and there is a 12.2 cm  segment of omentum which is extensively involved with adenocarcinoma. The tumor also involves the fimbria of the right fallopian tube and the parenchyma of the right and left ovaries as well as uterine serosa. The fimbria of the right fallopian has intraepithelial atypia consistent with a precursor lesion and therefore this is most consistent with primary fallopian tube adenocarcinoma. A primary peritoneal serous carcinoma is a less likely possibility.    12/26/2016 Pathology Results   PERITONEAL/ASCITIC FLUID(SPECIMEN 1 OF 1 COLLECTED 12/26/16): POORLY DIFFERENTIATED ADENOCARCINOMA    12/26/2016 Surgery   Procedure(s) Performed: Exploratory laparotomy with total abdominal hysterectomy, bilateral salpingo-oophorectomy, omentectomy radical tumor debulking for ovarian cancer .  Surgeon: Thereasa Solo, MD.   Operative Findings: 20cm left ovarian mass densely adherent to the posterior uterus, right tube and ovary, cervix, sigmoid colon. 10cm omental cake. 4L ascites. 39m size tumor nodules on the serosa of the terminal ileum and proximal sigmoid colon. 165mnodules on right diaphragm.    This represented an optimal cytoreduction (R1) with 80m54modules on intestine and diaphragm representing gross visible disease   01/16/2017 Procedure   Placement of single lumen port a cath via right internal jugular vein. The catheter tip lies at the cavo-atrial junction. A power injectable port a cath was placed and is ready for immediate use.   01/17/2017 Imaging   1. 3.5 x 7.2 x 4.6 cm fluid collection along the vaginal cuff, posterior the bladder and extending into the right adnexal space. This lesion demonstrates rim enhancement. Imaging features could be related to a loculated postoperative seroma or hematoma. Superinfection cannot be excluded by CT. Given debulking surgery was 3 weeks ago, this entire structure is un likely to represent neoplasm, but peritoneal involvement could have this appearance. 2. Small fluid collection in the left para colic gutter without rim enhancement. 3. Irregular/nodular appearance of the peritoneal M in the anatomic pelvis with areas of subtle nodularity between the stomach and the spleen. Close attention in these regions on follow-up recommended as metastatic  disease is a concern. 4. Mildly enlarged hepatoduodenal ligament lymph node. Attention on follow-up recommended.   01/20/2017 Tumor Marker   Patient's tumor was tested for the following markers: CA-125 Results of the tumor marker test revealed 46.6   01/28/2017 Genetic Testing       Negative genetic  testing on the [facility] panel.  The [facility] gene panel offered by [employer] includes sequencing and deletion/duplication testing of the following 28 genes: APC, ATM, BARD1, BMPR1A, BRCA1, BRCA2, BRIP1, CHD1, CDK4, CDKN2A, CHEK2, EPCAM (large rearrangement only), MLH1, MSH2, MSH6, MUTYH, NBN, PALB2, PMS2, PTEN, RAD51C, RAD51D, SMAD4, STK11, and TP53. Sequencing was performed for select regions of POLE and POLD1, and large rearrangement analysis was performed for select regions of GREM1. The report date is January 28, 2017.  HRD testing looking for genomic instability and BRCA mutations was negative.  The report date of this test is January 27, 2017.    01/31/2017 Tumor Marker   Patient's tumor was tested for the following markers: CA-125 Results of the tumor marker test revealed 22.4   03/04/2017 Tumor Marker   Patient's tumor was tested for the following markers: CA-125 Results of the tumor marker test revealed 13.8   04/18/2017 Tumor Marker   Patient's tumor was tested for the following markers: CA-125 Results of the tumor marker test revealed 11.9   05/05/2017 Tumor Marker   Patient's tumor was tested for the following markers: CA-125 Results of the tumor marker test revealed 12   05/26/2017 Tumor Marker   Patient's tumor was tested for the following markers: CA-125 Results of the tumor marker test revealed 10.6   05/26/2017 Imaging   1. A previously noted postoperative fluid collection in the low pelvis and residual ascites seen on the prior examination has resolved on today's study. No findings to suggest residual/recurrent disease on today's examination. No definite solid organ metastasis identified in the abdomen or pelvis. No lymphadenopathy. 2. Aortic atherosclerosis.   06/10/2017 Procedure   Successful right IJ vein Port-A-Cath explant.   03/09/2018 Tumor Marker   Patient's tumor was tested for the following markers: CA-125 Results of the tumor marker test  revealed 12.1   05/26/2018 Tumor Marker   Patient's tumor was tested for the following markers: CA-125 Results of the tumor marker test revealed 13.8   09/09/2018 Tumor Marker   Patient's tumor was tested for the following markers: CA-125 Results of the tumor marker test revealed 23.9   12/18/2018 Tumor Marker   Patient's tumor was tested for the following markers: CA-125 Results of the tumor marker test revealed 43.8   02/27/2019 - 02/28/2019 Hospital Admission   She was admitted for bowel obstruction   02/27/2019 Imaging   1. High-grade small bowel obstruction with transition point in the pelvis in an area of suspected desmoplastic reaction surrounding a 1.3 cm spiculated nodule in the mesentery, suspicious for carcinoid. There is hyperenhancement near the tip of the appendix and loss of the normal fat plane between it and the adjacent sigmoid colon, potentially the site of primary lesion. 2. Multiple new small subcentimeter hyperdense lesions scattered along the liver capsule, concerning for metastases. Enlarged hyperenhancing gastrohepatic and portacaval lymph nodes concerning for nodal metastases. 3. Very mild right hydroureteronephrosis to the level of the desmoplastic reaction in the pelvis, potentially involving the right ureter. 4. Infraumbilical ventral abdominal wall diastasis containing a small nondilated portion of transverse colon. 5. Trace ascites. 6. Cholelithiasis. 7.  Aortic atherosclerosis (ICD10-I70.0).     03/05/2019 Tumor  Marker   Patient's tumor was tested for the following markers: CA-125 Results of the tumor marker test revealed 70.1   03/12/2019 Echocardiogram   IMPRESSIONS     1. Left ventricular ejection fraction, by visual estimation, is 60 to 65%. The left ventricle has normal function. There is no left ventricular hypertrophy.  2. Left ventricular diastolic parameters are consistent with Grade I diastolic dysfunction (impaired relaxation).  3. Global right  ventricle has normal systolic function.The right ventricular size is normal. No increase in right ventricular wall thickness.  4. Left atrial size was normal.  5. Right atrial size was normal.  6. The pericardium was not assessed.  7. The mitral valve is normal in structure. No evidence of mitral valve regurgitation.  8. The tricuspid valve is normal in structure. Tricuspid valve regurgitation is trivial.  9. The aortic valve is normal in structure. Aortic valve regurgitation is not visualized. 10. The pulmonic valve was grossly normal. Pulmonic valve regurgitation is not visualized. 11. The average left ventricular global longitudinal strain is -17.6 %.   03/16/2019 Procedure   Successful placement of a right internal jugular approach power injectable Port-A-Cath. The catheter is ready for immediate use.     04/19/2019 Tumor Marker   Patient's tumor was tested for the following markers: CA-125 Results of the tumor marker test revealed 39.8   05/17/2019 Tumor Marker   Patient's tumor was tested for the following markers: CA-125 Results of the tumor marker test revealed 27   05/28/2019 Tumor Marker   Patient's tumor was tested for the following markers: CA-125 Results of the tumor marker test revealed 27.7.   06/11/2019 Imaging   1. No new or progressive metastatic disease in the abdomen or pelvis. 2. Tiny noncalcified perisplenic implant is decreased. Calcified pericardiophrenic, retroperitoneal and bilateral inguinal lymph nodes and scattered small calcified perihepatic and perisplenic implants are stable. 3.  Aortic Atherosclerosis (ICD10-I70.0).       06/17/2019 Echocardiogram    1. Left ventricular ejection fraction, by estimation, is 65 to 70%. The left ventricle has normal function. The left ventricle has no regional wall motion abnormalities. Left ventricular diastolic parameters were normal.  2. Right ventricular systolic function is normal. The right ventricular size is  normal.  3. The mitral valve is normal in structure and function. No evidence of mitral valve regurgitation. No evidence of mitral stenosis.  4. The aortic valve is normal in structure and function. Aortic valve regurgitation is not visualized. No aortic stenosis is present.   06/21/2019 Tumor Marker   Patient's tumor was tested for the following markers: CA-125 Results of the tumor marker test revealed 18.7   07/19/2019 Tumor Marker   Patient's tumor was tested for the following markers: CA-125 Results of the tumor marker test revealed 16.7     REVIEW OF SYSTEMS:   Constitutional: Denies fevers, chills or abnormal weight loss Eyes: Denies blurriness of vision Ears, nose, mouth, throat, and face: Denies mucositis or sore throat Respiratory: Denies cough, dyspnea or wheezes Cardiovascular: Denies palpitation, chest discomfort or lower extremity swelling Gastrointestinal:  Denies nausea, heartburn or change in bowel habits Skin: Denies abnormal skin rashes Lymphatics: Denies new lymphadenopathy or easy bruising Neurological:Denies numbness, tingling or new weaknesses Behavioral/Psych: Mood is stable, no new changes  All other systems were reviewed with the patient and are negative.  I have reviewed the past medical history, past surgical history, social history and family history with the patient and they are unchanged from previous note.  ALLERGIES:  is allergic to dilaudid [hydromorphone hcl]; morphine and related; and sudafed [pseudoephedrine hcl].  MEDICATIONS:  Current Outpatient Medications  Medication Sig Dispense Refill  . acetaminophen (TYLENOL) 325 MG tablet Take 975 mg by mouth every 6 (six) hours as needed (pain).    Marland Kitchen amoxicillin (AMOXIL) 500 MG capsule Take 1 capsule (500 mg total) by mouth 2 (two) times daily. 10 capsule 0  . HYDROcodone-acetaminophen (NORCO/VICODIN) 5-325 MG tablet Take 1 tablet by mouth every 6 (six) hours as needed.    . lidocaine-prilocaine (EMLA)  cream APP EXT AA 1 TIME    . magic mouthwash w/lidocaine SOLN Take 5 mLs by mouth 4 (four) times daily. Take 5 ml's 4 x daily for 7 days. Swish and spit. 140 mL 0  . methylPREDNISolone (MEDROL DOSEPAK) 4 MG TBPK tablet Take 4 mg by mouth See admin instructions. follow package directions    . ondansetron (ZOFRAN) 8 MG tablet Take 1 tablet (8 mg total) by mouth every 8 (eight) hours as needed. 30 tablet 1  . prochlorperazine (COMPAZINE) 10 MG tablet TAKE 1 TABLET BY MOUTH EVERY 6 HOURS AS NEEDED FOR NAUSEA AND/OR VOMITING 30 tablet 1  . promethazine (PHENERGAN) 25 MG suppository Place 1 suppository (25 mg total) rectally 2 (two) times daily as needed for nausea or vomiting. 12 each 0  . traMADol (ULTRAM) 50 MG tablet Take 50 mg by mouth 3 (three) times daily as needed.    . venlafaxine XR (EFFEXOR-XR) 37.5 MG 24 hr capsule TAKE 1 CAPSULE BY MOUTH DAILY WITH BREAKFAST (Patient taking differently: Take 37.5 mg by mouth daily with breakfast. ) 90 capsule 6   No current facility-administered medications for this visit.   Facility-Administered Medications Ordered in Other Visits  Medication Dose Route Frequency Provider Last Rate Last Admin  . sodium chloride flush (NS) 0.9 % injection 10 mL  10 mL Intravenous PRN Alvy Bimler, Josten Warmuth, MD   10 mL at 02/11/17 0839  . sodium chloride flush (NS) 0.9 % injection 10 mL  10 mL Intravenous PRN Alvy Bimler, Rasheena Talmadge, MD   10 mL at 03/04/17 1510    PHYSICAL EXAMINATION: ECOG PERFORMANCE STATUS: 1 - Symptomatic but completely ambulatory  Vitals:   08/23/19 1037  BP: (!) 128/54  Pulse: 71  Resp: 18  Temp: 98 F (36.7 C)  SpO2: 100%   Filed Weights   08/23/19 1037  Weight: 170 lb (77.1 kg)    GENERAL:alert, no distress and comfortable  LABORATORY DATA:  I have reviewed the data as listed    Component Value Date/Time   NA 142 07/19/2019 0951   NA 139 04/14/2017 0742   K 3.8 07/19/2019 0951   K 4.0 04/14/2017 0742   CL 107 07/19/2019 0951   CO2 23 07/19/2019  0951   CO2 21 (L) 04/14/2017 0742   GLUCOSE 163 (H) 07/19/2019 0951   GLUCOSE 247 (H) 04/14/2017 0742   BUN 9 07/19/2019 0951   BUN 13.1 04/14/2017 0742   CREATININE 0.87 07/19/2019 0951   CREATININE 0.9 04/14/2017 0742   CALCIUM 8.9 07/19/2019 0951   CALCIUM 9.2 04/14/2017 0742   PROT 6.7 07/19/2019 0951   PROT 7.3 04/14/2017 0742   ALBUMIN 3.6 07/19/2019 0951   ALBUMIN 3.9 04/14/2017 0742   AST 21 07/19/2019 0951   AST 17 04/14/2017 0742   ALT 13 07/19/2019 0951   ALT 14 04/14/2017 0742   ALKPHOS 74 07/19/2019 0951   ALKPHOS 70 04/14/2017 0742   BILITOT 0.3 07/19/2019 0951   BILITOT  0.37 04/14/2017 0742   GFRNONAA >60 07/19/2019 0951   GFRAA >60 07/19/2019 0951    No results found for: SPEP, UPEP  Lab Results  Component Value Date   WBC 2.8 (L) 08/23/2019   NEUTROABS 1.0 (L) 08/23/2019   HGB 9.6 (L) 08/23/2019   HCT 30.3 (L) 08/23/2019   MCV 103.1 (H) 08/23/2019   PLT 245 08/23/2019      Chemistry      Component Value Date/Time   NA 142 07/19/2019 0951   NA 139 04/14/2017 0742   K 3.8 07/19/2019 0951   K 4.0 04/14/2017 0742   CL 107 07/19/2019 0951   CO2 23 07/19/2019 0951   CO2 21 (L) 04/14/2017 0742   BUN 9 07/19/2019 0951   BUN 13.1 04/14/2017 0742   CREATININE 0.87 07/19/2019 0951   CREATININE 0.9 04/14/2017 0742      Component Value Date/Time   CALCIUM 8.9 07/19/2019 0951   CALCIUM 9.2 04/14/2017 0742   ALKPHOS 74 07/19/2019 0951   ALKPHOS 70 04/14/2017 0742   AST 21 07/19/2019 0951   AST 17 04/14/2017 0742   ALT 13 07/19/2019 0951   ALT 14 04/14/2017 0742   BILITOT 0.3 07/19/2019 0951   BILITOT 0.37 04/14/2017 1287

## 2019-08-24 ENCOUNTER — Telehealth: Payer: Self-pay

## 2019-08-24 NOTE — Progress Notes (Signed)
Pharmacist Chemotherapy Monitoring - Follow Up Assessment    I verify that I have reviewed each item in the below checklist:  . Regimen for the patient is scheduled for the appropriate day and plan matches scheduled date. Marland Kitchen Appropriate non-routine labs are ordered dependent on drug ordered. . If applicable, additional medications reviewed and ordered per protocol based on lifetime cumulative doses and/or treatment regimen.   Plan for follow-up and/or issues identified: No . I-vent associated with next due treatment: No . MD and/or nursing notified: No  Holly [name] K 08/24/2019 11:21 AM

## 2019-08-24 NOTE — Telephone Encounter (Signed)
[facility] request a copy of lab results faxed to office at [phone]. Received confirmation. Called the office and spoke with office staff they received the fax.  Called Holly [name] to let her know that labs faxed and Dr. Alvy Bimler filled out medical consultation report and [facility]ed to [facility]. She verbalzied understanding.

## 2019-08-30 ENCOUNTER — Other Ambulatory Visit: Payer: Self-pay

## 2019-08-30 ENCOUNTER — Inpatient Hospital Stay: Payer: BC Managed Care – PPO

## 2019-08-30 ENCOUNTER — Other Ambulatory Visit: Payer: BC Managed Care – PPO

## 2019-08-30 ENCOUNTER — Other Ambulatory Visit: Payer: Self-pay | Admitting: Hematology and Oncology

## 2019-08-30 ENCOUNTER — Ambulatory Visit: Payer: BC Managed Care – PPO

## 2019-08-30 VITALS — BP 160/80 | HR 67 | Temp 97.8°F | Resp 18 | Wt 171.2 lb

## 2019-08-30 DIAGNOSIS — C5701 Malignant neoplasm of right fallopian tube: Secondary | ICD-10-CM

## 2019-08-30 DIAGNOSIS — D61818 Other pancytopenia: Secondary | ICD-10-CM | POA: Diagnosis not present

## 2019-08-30 DIAGNOSIS — Z90722 Acquired absence of ovaries, bilateral: Secondary | ICD-10-CM | POA: Diagnosis not present

## 2019-08-30 DIAGNOSIS — C569 Malignant neoplasm of unspecified ovary: Secondary | ICD-10-CM

## 2019-08-30 DIAGNOSIS — Z5111 Encounter for antineoplastic chemotherapy: Secondary | ICD-10-CM | POA: Diagnosis not present

## 2019-08-30 DIAGNOSIS — Z90710 Acquired absence of both cervix and uterus: Secondary | ICD-10-CM | POA: Diagnosis not present

## 2019-08-30 DIAGNOSIS — C562 Malignant neoplasm of left ovary: Secondary | ICD-10-CM

## 2019-08-30 DIAGNOSIS — Z7189 Other specified counseling: Secondary | ICD-10-CM

## 2019-08-30 LAB — COMPREHENSIVE METABOLIC PANEL
ALT: 10 U/L (ref 0–44)
AST: 14 U/L — ABNORMAL LOW (ref 15–41)
Albumin: 3.5 g/dL (ref 3.5–5.0)
Alkaline Phosphatase: 77 U/L (ref 38–126)
Anion gap: 10 (ref 5–15)
BUN: 7 mg/dL — ABNORMAL LOW (ref 8–23)
CO2: 22 mmol/L (ref 22–32)
Calcium: 8.9 mg/dL (ref 8.9–10.3)
Chloride: 109 mmol/L (ref 98–111)
Creatinine, Ser: 0.86 mg/dL (ref 0.44–1.00)
GFR calc Af Amer: >60 mL/min (ref >60)
GFR calc non Af Amer: >60 mL/min (ref >60)
Glucose, Bld: 145 mg/dL — ABNORMAL HIGH (ref 70–99)
Potassium: 3.7 mmol/L (ref 3.5–5.1)
Sodium: 141 mmol/L (ref 135–145)
Total Bilirubin: 0.4 mg/dL (ref 0.3–1.2)
Total Protein: 6.6 g/dL (ref 6.5–8.1)

## 2019-08-30 LAB — CBC WITH DIFFERENTIAL (CANCER CENTER ONLY)
Abs Immature Granulocytes: 0.03 10*3/uL (ref 0.00–0.07)
Basophils Absolute: 0 10*3/uL (ref 0.0–0.1)
Basophils Relative: 1 %
Eosinophils Absolute: 0 10*3/uL (ref 0.0–0.5)
Eosinophils Relative: 1 %
HCT: 31.7 % — ABNORMAL LOW (ref 36.0–46.0)
Hemoglobin: 10.2 g/dL — ABNORMAL LOW (ref 12.0–15.0)
Immature Granulocytes: 1 %
Lymphocytes Relative: 35 %
Lymphs Abs: 1.3 10*3/uL (ref 0.7–4.0)
MCH: 33.2 pg (ref 26.0–34.0)
MCHC: 32.2 g/dL (ref 30.0–36.0)
MCV: 103.3 fL — ABNORMAL HIGH (ref 80.0–100.0)
Monocytes Absolute: 0.6 10*3/uL (ref 0.1–1.0)
Monocytes Relative: 15 %
Neutro Abs: 1.8 10*3/uL (ref 1.7–7.7)
Neutrophils Relative %: 47 %
Platelet Count: 248 10*3/uL (ref 150–400)
RBC: 3.07 MIL/uL — ABNORMAL LOW (ref 3.87–5.11)
RDW: 15.9 % — ABNORMAL HIGH (ref 11.5–15.5)
WBC Count: 3.8 10*3/uL — ABNORMAL LOW (ref 4.0–10.5)
nRBC: 0 % (ref 0.0–0.2)

## 2019-08-30 MED ORDER — FAMOTIDINE IN NACL 20-0.9 MG/50ML-% IV SOLN
20.0000 mg | Freq: Once | INTRAVENOUS | Status: AC
  Administered 2019-08-30: 20 mg via INTRAVENOUS

## 2019-08-30 MED ORDER — SODIUM CHLORIDE 0.9 % IV SOLN
10.0000 mg | Freq: Once | INTRAVENOUS | Status: AC
  Administered 2019-08-30: 10 mg via INTRAVENOUS
  Filled 2019-08-30: qty 1
  Filled 2019-08-30: qty 10

## 2019-08-30 MED ORDER — FAMOTIDINE IN NACL 20-0.9 MG/50ML-% IV SOLN
INTRAVENOUS | Status: AC
  Filled 2019-08-30: qty 50

## 2019-08-30 MED ORDER — DOXORUBICIN HCL LIPOSOMAL CHEMO INJECTION 2 MG/ML
24.0000 mg/m2 | Freq: Once | INTRAVENOUS | Status: AC
  Administered 2019-08-30: 46 mg via INTRAVENOUS
  Filled 2019-08-30: qty 23

## 2019-08-30 MED ORDER — SODIUM CHLORIDE 0.9% FLUSH
10.0000 mL | INTRAVENOUS | Status: DC | PRN
  Administered 2019-08-30: 10 mL
  Filled 2019-08-30: qty 10

## 2019-08-30 MED ORDER — SODIUM CHLORIDE 0.9 % IV SOLN
430.0000 mg | Freq: Once | INTRAVENOUS | Status: AC
  Administered 2019-08-30: 430 mg via INTRAVENOUS
  Filled 2019-08-30: qty 43

## 2019-08-30 MED ORDER — DIPHENHYDRAMINE HCL 50 MG/ML IJ SOLN
25.0000 mg | Freq: Once | INTRAMUSCULAR | Status: AC
  Administered 2019-08-30: 25 mg via INTRAVENOUS

## 2019-08-30 MED ORDER — DIPHENHYDRAMINE HCL 50 MG/ML IJ SOLN
INTRAMUSCULAR | Status: AC
  Filled 2019-08-30: qty 1

## 2019-08-30 MED ORDER — SODIUM CHLORIDE 0.9% FLUSH
10.0000 mL | Freq: Once | INTRAVENOUS | Status: AC
  Administered 2019-08-30: 10 mL
  Filled 2019-08-30: qty 10

## 2019-08-30 MED ORDER — SODIUM CHLORIDE 0.9 % IV SOLN
150.0000 mg | Freq: Once | INTRAVENOUS | Status: AC
  Administered 2019-08-30: 150 mg via INTRAVENOUS
  Filled 2019-08-30: qty 5
  Filled 2019-08-30: qty 150

## 2019-08-30 MED ORDER — PALONOSETRON HCL INJECTION 0.25 MG/5ML
INTRAVENOUS | Status: AC
  Filled 2019-08-30: qty 5

## 2019-08-30 MED ORDER — PALONOSETRON HCL INJECTION 0.25 MG/5ML
0.2500 mg | Freq: Once | INTRAVENOUS | Status: AC
  Administered 2019-08-30: 0.25 mg via INTRAVENOUS

## 2019-08-30 MED ORDER — DEXTROSE 5 % IV SOLN
Freq: Once | INTRAVENOUS | Status: AC
  Filled 2019-08-30: qty 250

## 2019-08-30 MED ORDER — HEPARIN SOD (PORK) LOCK FLUSH 100 UNIT/ML IV SOLN
500.0000 [IU] | Freq: Once | INTRAVENOUS | Status: AC | PRN
  Administered 2019-08-30: 500 [IU]
  Filled 2019-08-30: qty 5

## 2019-08-30 NOTE — Progress Notes (Signed)
Confirmed carboplatin dose today with Dr. Alvy Bimler today - use carboplatin AUC = 4, dose = 430mg .   Demetrius Charity, PharmD, BCPS, [facility] Pharmacist Pharmacy Phone: [phone] 08/30/2019

## 2019-08-30 NOTE — Patient Instructions (Signed)
[facility] Discharge Instructions for Patients Receiving Chemotherapy  Today you received the following chemotherapy agents: liposomal doxorubicin and carboplatin.  To help prevent nausea and vomiting after your treatment, we encourage you to take your nausea medication as directed.   If you develop nausea and vomiting that is not controlled by your nausea medication, call the clinic.   BELOW ARE SYMPTOMS THAT SHOULD BE REPORTED IMMEDIATELY:  *FEVER GREATER THAN 100.5 F  *CHILLS WITH OR WITHOUT FEVER  NAUSEA AND VOMITING THAT IS NOT CONTROLLED WITH YOUR NAUSEA MEDICATION  *UNUSUAL SHORTNESS OF BREATH  *UNUSUAL BRUISING OR BLEEDING  TENDERNESS IN MOUTH AND THROAT WITH OR WITHOUT PRESENCE OF ULCERS  *URINARY PROBLEMS  *BOWEL PROBLEMS  UNUSUAL RASH Items with * indicate a potential emergency and should be followed up as soon as possible.  Feel free to call the clinic should you have any questions or concerns. The clinic phone number is [phone].  Please show the CHEMO ALERT CARD at check-in to the Emergency Department and triage nurse.   

## 2019-08-30 NOTE — Patient Instructions (Signed)

## 2019-08-31 LAB — CA 125: Cancer Antigen (CA) 125: 13.9 U/mL (ref 0.0–38.1)

## 2019-09-15 ENCOUNTER — Ambulatory Visit (HOSPITAL_COMMUNITY)
Admission: RE | Admit: 2019-09-15 | Discharge: 2019-09-15 | Disposition: A | Discharge status: Home / Self Care | Payer: BC Managed Care – PPO | Source: Ambulatory Visit | Attending: Hematology and Oncology | Admitting: Hematology and Oncology

## 2019-09-15 ENCOUNTER — Other Ambulatory Visit: Payer: Self-pay

## 2019-09-15 DIAGNOSIS — I10 Essential (primary) hypertension: Secondary | ICD-10-CM | POA: Insufficient documentation

## 2019-09-15 DIAGNOSIS — C562 Malignant neoplasm of left ovary: Secondary | ICD-10-CM | POA: Insufficient documentation

## 2019-09-15 NOTE — Progress Notes (Signed)
*   Echocardiogram 2D Echocardiogram has been performed.  Jannett Celestine 09/15/2019, 11:04 AM

## 2019-09-23 ENCOUNTER — Telehealth: Payer: Self-pay

## 2019-09-23 ENCOUNTER — Telehealth: Payer: Self-pay | Admitting: Hematology and Oncology

## 2019-09-23 NOTE — Telephone Encounter (Signed)
Scheduled per 6/3 sch message. Pt aware of appts on 6/14. Pt requested to move appts to this date.

## 2019-09-23 NOTE — Telephone Encounter (Signed)
Called to clarify moving appts. She wants to keep appt with Dr. Alvy Bimler for 6/7 and move the other appts to 6/14. Scheduling message sent.

## 2019-09-24 ENCOUNTER — Encounter (HOSPITAL_COMMUNITY): Payer: Self-pay

## 2019-09-24 ENCOUNTER — Other Ambulatory Visit: Payer: Self-pay

## 2019-09-24 ENCOUNTER — Ambulatory Visit (HOSPITAL_COMMUNITY)
Admission: RE | Admit: 2019-09-24 | Discharge: 2019-09-24 | Disposition: A | Discharge status: Home / Self Care | Payer: BC Managed Care – PPO | Source: Ambulatory Visit | Attending: Hematology and Oncology | Admitting: Hematology and Oncology

## 2019-09-24 DIAGNOSIS — C5701 Malignant neoplasm of right fallopian tube: Secondary | ICD-10-CM | POA: Diagnosis not present

## 2019-09-24 DIAGNOSIS — C569 Malignant neoplasm of unspecified ovary: Secondary | ICD-10-CM | POA: Diagnosis not present

## 2019-09-24 MED ORDER — HEPARIN SOD (PORK) LOCK FLUSH 100 UNIT/ML IV SOLN
500.0000 [IU] | Freq: Once | INTRAVENOUS | Status: AC
  Administered 2019-09-24: 500 [IU] via INTRAVENOUS

## 2019-09-24 MED ORDER — SODIUM CHLORIDE (PF) 0.9 % IJ SOLN
INTRAMUSCULAR | Status: AC
  Filled 2019-09-24: qty 50

## 2019-09-24 MED ORDER — HEPARIN SOD (PORK) LOCK FLUSH 100 UNIT/ML IV SOLN
INTRAVENOUS | Status: AC
  Filled 2019-09-24: qty 5

## 2019-09-24 MED ORDER — IOHEXOL 300 MG/ML  SOLN
100.0000 mL | Freq: Once | INTRAMUSCULAR | Status: AC | PRN
  Administered 2019-09-24: 100 mL via INTRAVENOUS

## 2019-09-24 MED FILL — Dexamethasone Sodium Phosphate Inj 100 MG/10ML: INTRAMUSCULAR | Qty: 1 | Status: AC

## 2019-09-24 MED FILL — Fosaprepitant Dimeglumine For IV Infusion 150 MG (Base Eq): INTRAVENOUS | Qty: 5 | Status: AC

## 2019-09-27 ENCOUNTER — Other Ambulatory Visit: Payer: Self-pay

## 2019-09-27 ENCOUNTER — Inpatient Hospital Stay: Payer: BC Managed Care – PPO

## 2019-09-27 ENCOUNTER — Inpatient Hospital Stay: Payer: BC Managed Care – PPO | Attending: Hematology and Oncology | Admitting: Hematology and Oncology

## 2019-09-27 ENCOUNTER — Other Ambulatory Visit: Payer: Self-pay | Admitting: Hematology and Oncology

## 2019-09-27 VITALS — BP 150/60 | HR 81 | Temp 97.7°F | Resp 18 | Ht 65.0 in | Wt 170.2 lb

## 2019-09-27 DIAGNOSIS — I10 Essential (primary) hypertension: Secondary | ICD-10-CM | POA: Insufficient documentation

## 2019-09-27 DIAGNOSIS — C786 Secondary malignant neoplasm of retroperitoneum and peritoneum: Secondary | ICD-10-CM | POA: Insufficient documentation

## 2019-09-27 DIAGNOSIS — C5701 Malignant neoplasm of right fallopian tube: Secondary | ICD-10-CM | POA: Diagnosis not present

## 2019-09-27 DIAGNOSIS — C562 Malignant neoplasm of left ovary: Secondary | ICD-10-CM | POA: Diagnosis not present

## 2019-09-27 DIAGNOSIS — Z5111 Encounter for antineoplastic chemotherapy: Secondary | ICD-10-CM | POA: Diagnosis not present

## 2019-09-27 DIAGNOSIS — Z7189 Other specified counseling: Secondary | ICD-10-CM | POA: Diagnosis not present

## 2019-09-27 DIAGNOSIS — C561 Malignant neoplasm of right ovary: Secondary | ICD-10-CM | POA: Diagnosis not present

## 2019-09-27 DIAGNOSIS — Z5112 Encounter for antineoplastic immunotherapy: Secondary | ICD-10-CM | POA: Insufficient documentation

## 2019-09-27 DIAGNOSIS — Z79899 Other long term (current) drug therapy: Secondary | ICD-10-CM | POA: Insufficient documentation

## 2019-09-28 ENCOUNTER — Telehealth: Payer: Self-pay | Admitting: Hematology and Oncology

## 2019-09-28 ENCOUNTER — Telehealth: Payer: Self-pay

## 2019-09-28 ENCOUNTER — Other Ambulatory Visit: Payer: Self-pay | Admitting: Hematology and Oncology

## 2019-09-28 ENCOUNTER — Encounter: Payer: Self-pay | Admitting: Hematology and Oncology

## 2019-09-28 NOTE — Telephone Encounter (Signed)
Called and given below message. She verbalized understanding. She will call the office for BP issues and check BID. She is agreeable to 7/12 appt.

## 2019-09-28 NOTE — Telephone Encounter (Signed)
-----   Message from Heath Lark, MD sent at 09/28/2019  8:14 AM EDT ----- Regarding: decision: can you call? 1) which option: infusion or pill? 2) does she want to keep Monday or delay treatment?

## 2019-09-28 NOTE — Telephone Encounter (Signed)
I am ordering Avastin for Monday 1) get her to check BP twice daily and if SBP >150 or DBP >95 she needs to call 2) second dose will fall on 7/5, we are close. I suggest we delay next dose to 7/12, if ok with her let me knwo and I will send schediling msg

## 2019-09-28 NOTE — Progress Notes (Signed)
DISCONTINUE ON PATHWAY REGIMEN - Ovarian     A cycle is every 28 days:     Carboplatin      Liposomal doxorubicin   **Always confirm dose/schedule in your pharmacy ordering system**  REASON: Other Reason PRIOR TREATMENT: [id number]: Liposomal Doxorubicin (Doxil) 30 mg/m2 + Carboplatin AUC=5 q28 Days; Stop Treatment After 6 Cycles If Complete Response, Otherwise Continue Treatment Until Progression or Unacceptable Toxicity TREATMENT RESPONSE: Complete Response (CR)  START OFF PATHWAY REGIMEN - Ovarian   [id number]:Bevacizumab 15 mg/kg q21d:   A cycle is every 21 days:     Bevacizumab-xxxx   **Always confirm dose/schedule in your pharmacy ordering system**  Patient Characteristics: Recurrent or Progressive Disease, Maintenance Therapy Therapeutic Status: Recurrent or Progressive Disease BRCA Mutation Status: Absent Line of Therapy: Maintenance Therapy  Intent of Therapy: Non-Curative / Palliative Intent, Discussed with Patient 

## 2019-09-28 NOTE — Assessment & Plan Note (Signed)
We have extensive discussions about goals of care She is contemplating retiring from work We discussed prognosis with or without therapy

## 2019-09-28 NOTE — Telephone Encounter (Signed)
Called and given below message. She verbalized understanding. She would like to get the infusion treatment and keep appt as scheudled on Monday.

## 2019-09-28 NOTE — Progress Notes (Signed)
[facility] OFFICE PROGRESS NOTE  Patient Care Team: Gaynelle Arabian, MD as PCP - General (Family Medicine) Heath Lark, MD as Consulting Physician (Hematology and Oncology) Everitt Amber, MD as Consulting Physician (Obstetrics and Gynecology)  ASSESSMENT & PLAN:  Adenocarcinoma of right fallopian tube [facility]) I have reviewed multiple imaging studies with the patient and her husband She has achieved complete response to therapy We discussed the role of maintenance treatment We discussed the risk, benefits, side effects of maintenance bevacizumab versus niraparib Ultimately, she has made informed decision to proceed with bevacizumab She is aware about the need for aggressive blood pressure monitoring while on treatment She is also aware that she needs to inform me whenever she plans to undergo any kind of elective surgical procedure while on bevacizumab I will get her started next week I will see her back next month prior to cycle 2 of treatment for toxicity review  Goals of care, counseling/discussion We have extensive discussions about goals of care She is contemplating retiring from work We discussed prognosis with or without therapy   Orders Placed This Encounter  Procedures   Total Protein, Urine dipstick    Standing Status:   Standing    Number of Occurrences:   20    Standing Expiration Date:   09/27/2020    All questions were answered. The patient knows to call the clinic with any problems, questions or concerns. The total time spent in the appointment was 55 minutes encounter with patients including review of chart and various tests results, discussions about plan of care and coordination of care plan   Heath Lark, MD 09/28/2019 11:27 AM  INTERVAL HISTORY: Please see below for problem oriented charting. She returns to review test results Since last time I saw her, she tolerated treatment fairly well No recent mucositis, nausea or side effects from  treatment Her appetite is fair She is wondering whether she should retire end of the month  SUMMARY OF ONCOLOGIC HISTORY: Oncology History Overview Note  Neg genetics   Adenocarcinoma of right fallopian tube ([place])  08/12/2016 Initial Diagnosis   The patient has many months of vague symptoms of fullness in the pelvis, urinary frequency and difficulty with defecation. She denies vaginal bleeding or rectal bleeding.    08/12/2016 Imaging   Abdomen X-ray in the ER: Moderate colonic stool burden without evidence of enteric obstruction   11/13/2016 Imaging   TVUS was performed on 11/13/16 which showed a large hypoechoic lobular mass seen posterior to the uterus measuring 18.2x16.1x11.8cm, blood flow was seen along the periphery of the mass. It was considered to be likely to be a fibroid vs pelvic mass vs ovarian mass. Neither ovary was removed. Moderate amount of free fluid in the pelvis. THe uterus measures 4x10x4cm. The endometrium is 65m.    12/05/2016 Imaging   MR pelvis 1. Mild limitations as detailed above. 2. Heterogeneous pelvic mass is favored to arise from the right ovary. Favor a solid ovarian neoplasm such as fibroma/Brenner's tumor. Given lesion size and heterogeneous T2 signal out, complicating torsion cannot be excluded. 3. Moderate abdominopelvic ascites, without specific evidence of peritoneal metastasis. Consider further evaluation with contrast-enhanced abdominopelvic CT.    12/26/2016 Pathology Results   1. Uterus, ovaries and fallopian tubes - ADENOCARCINOMA INVOLVING RIGHT FALLOPIAN TUBE, RIGHT AND LEFT OVARIES, UTERINE SEROSA AND PELVIC MASS. - SEE ONCOLOGY TABLE AND COMMENT. - UTERINE CERVIX, ENDOMETRIUM, MYOMETRIUM AND LEFT FALLOPIAN TUBE FREE OF TUMOR. 2. Omentum, resection for tumor - ADENOCARCINOMA. Microscopic  Comment 1. ONCOLOGY TABLE - FALLOPIAN TUBE. SEE COMMENT 1. Specimen, including laterality: Uterus, bilateral adnexa, pelvic mass and omentum. 2.  Procedure: Hysterectomy with bilateral salpingo-oophorectomy, pelvic mass excision and omentectomy. 3. Lymph node sampling performed: No 4. Tumor site: See comment. 5. Tumor location in fallopian tube: Right fallopian tube fimbria 6. Specimen integrity (intact/ruptured/disrupted): Intact 7. Tumor size (cm): 18 cm, see comment. 8. Histologic type: Adenocarcinoma 9. Grade: High grade 10. Microscopic tumor extension: Tumor involves right fallopian tube, right and left ovaries, uterine serosa, pelvic mass and omentum. 11. Margins: See comment. 12. Lymph-Vascular invasion: Present 13. Lymph nodes: # examined: 0; # positive: N/A 14. TNM: pT3c, pNX 15. FIGO Stage (based on pathologic findings, needs clinical correlation: III-C 16. Comments: There is an 18 cm pelvic mass which is a high grade adenocarcinoma and there is a 12.2 cm  segment of omentum which is extensively involved with adenocarcinoma. The tumor also involves the fimbria of the right fallopian tube and the parenchyma of the right and left ovaries as well as uterine serosa. The fimbria of the right fallopian has intraepithelial atypia consistent with a precursor lesion and therefore this is most consistent with primary fallopian tube adenocarcinoma. A primary peritoneal serous carcinoma is a less likely possibility.    12/26/2016 Pathology Results   PERITONEAL/ASCITIC FLUID(SPECIMEN 1 OF 1 COLLECTED 12/26/16): POORLY DIFFERENTIATED ADENOCARCINOMA   12/26/2016 Surgery   Procedure(s) Performed: Exploratory laparotomy with total abdominal hysterectomy, bilateral salpingo-oophorectomy, omentectomy radical tumor debulking for ovarian cancer .  Surgeon: Thereasa Solo, MD.   Operative Findings: 20cm left ovarian mass densely adherent to the posterior uterus, right tube and ovary, cervix, sigmoid colon. 10cm omental cake. 4L ascites. 57m size tumor nodules on the serosa of the terminal ileum and proximal sigmoid colon. 189mnodules on right  diaphragm.    This represented an optimal cytoreduction (R1) with 5m82modules on intestine and diaphragm representing gross visible disease   01/16/2017 Procedure   Placement of single lumen port a cath via right internal jugular vein. The catheter tip lies at the cavo-atrial junction. A power injectable port a cath was placed and is ready for immediate use.   01/17/2017 Imaging   1. 3.5 x 7.2 x 4.6 cm fluid collection along the vaginal cuff, posterior the bladder and extending into the right adnexal space. This lesion demonstrates rim enhancement. Imaging features could be related to a loculated postoperative seroma or hematoma. Superinfection cannot be excluded by CT. Given debulking surgery was 3 weeks ago, this entire structure is un likely to represent neoplasm, but peritoneal involvement could have this appearance. 2. Small fluid collection in the left para colic gutter without rim enhancement. 3. Irregular/nodular appearance of the peritoneal M in the anatomic pelvis with areas of subtle nodularity between the stomach and the spleen. Close attention in these regions on follow-up recommended as metastatic disease is a concern. 4. Mildly enlarged hepatoduodenal ligament lymph node. Attention on follow-up recommended.   01/20/2017 Tumor Marker   Patient's tumor was tested for the following markers: CA-125 Results of the tumor marker test revealed 46.6   01/28/2017 Genetic Testing       Negative genetic testing on the [facility].  The [facility] panel offered by [employer] [employer] sequencing and deletion/duplication testing of the following 28 genes: APC, ATM, BARD1, BMPR1A, BRCA1, BRCA2, BRIP1, CHD1, CDK4, CDKN2A, CHEK2, EPCAM (large rearrangement only), MLH1, MSH2, MSH6, MUTYH, NBN, PALB2, PMS2, PTEN, RAD51C, RAD51D, SMAD4, STK11, and TP53. Sequencing was  performed for select regions of POLE and POLD1, and large rearrangement analysis was performed for select regions  of GREM1. The report date is January 28, 2017.  HRD testing looking for genomic instability and BRCA mutations was negative.  The report date of this test is January 27, 2017.    01/31/2017 Tumor Marker   Patient's tumor was tested for the following markers: CA-125 Results of the tumor marker test revealed 22.4   03/04/2017 Tumor Marker   Patient's tumor was tested for the following markers: CA-125 Results of the tumor marker test revealed 13.8   04/18/2017 Tumor Marker   Patient's tumor was tested for the following markers: CA-125 Results of the tumor marker test revealed 11.9   05/05/2017 Tumor Marker   Patient's tumor was tested for the following markers: CA-125 Results of the tumor marker test revealed 12   05/26/2017 Tumor Marker   Patient's tumor was tested for the following markers: CA-125 Results of the tumor marker test revealed 10.6   05/26/2017 Imaging   1. A previously noted postoperative fluid collection in the low pelvis and residual ascites seen on the prior examination has resolved on today's study. No findings to suggest residual/recurrent disease on today's examination. No definite solid organ metastasis identified in the abdomen or pelvis. No lymphadenopathy. 2. Aortic atherosclerosis.   06/10/2017 Procedure   Successful right IJ vein Port-A-Cath explant.   03/09/2018 Tumor Marker   Patient's tumor was tested for the following markers: CA-125 Results of the tumor marker test revealed 12.1   05/26/2018 Tumor Marker   Patient's tumor was tested for the following markers: CA-125 Results of the tumor marker test revealed 13.8   09/09/2018 Tumor Marker   Patient's tumor was tested for the following markers: CA-125 Results of the tumor marker test revealed 23.9   12/18/2018 Tumor Marker   Patient's tumor was tested for the following markers: CA-125 Results of the tumor marker test revealed 43.8   02/27/2019 - 02/28/2019 Hospital Admission   She was admitted for bowel  obstruction   02/27/2019 Imaging   1. High-grade small bowel obstruction with transition point in the pelvis in an area of suspected desmoplastic reaction surrounding a 1.3 cm spiculated nodule in the mesentery, suspicious for carcinoid. There is hyperenhancement near the tip of the appendix and loss of the normal fat plane between it and the adjacent sigmoid colon, potentially the site of primary lesion. 2. Multiple new small subcentimeter hyperdense lesions scattered along the liver capsule, concerning for metastases. Enlarged hyperenhancing gastrohepatic and portacaval lymph nodes concerning for nodal metastases. 3. Very mild right hydroureteronephrosis to the level of the desmoplastic reaction in the pelvis, potentially involving the right ureter. 4. Infraumbilical ventral abdominal wall diastasis containing a small nondilated portion of transverse colon. 5. Trace ascites. 6. Cholelithiasis. 7.  Aortic atherosclerosis (ICD10-I70.0).     03/05/2019 Tumor Marker   Patient's tumor was tested for the following markers: CA-125 Results of the tumor marker test revealed 70.1   03/12/2019 Echocardiogram   IMPRESSIONS     1. Left ventricular ejection fraction, by visual estimation, is 60 to 65%. The left ventricle has normal function. There is no left ventricular hypertrophy.  2. Left ventricular diastolic parameters are consistent with Grade I diastolic dysfunction (impaired relaxation).  3. Global right ventricle has normal systolic function.The right ventricular size is normal. No increase in right ventricular wall thickness.  4. Left atrial size was normal.  5. Right atrial size was normal.  6. The pericardium  was not assessed.  7. The mitral valve is normal in structure. No evidence of mitral valve regurgitation.  8. The tricuspid valve is normal in structure. Tricuspid valve regurgitation is trivial.  9. The aortic valve is normal in structure. Aortic valve regurgitation is not  visualized. 10. The pulmonic valve was grossly normal. Pulmonic valve regurgitation is not visualized. 11. The average left ventricular global longitudinal strain is -17.6 %.   03/16/2019 Procedure   Successful placement of a right internal jugular approach power injectable Port-A-Cath. The catheter is ready for immediate use.     04/19/2019 Tumor Marker   Patient's tumor was tested for the following markers: CA-125 Results of the tumor marker test revealed 39.8   05/17/2019 Tumor Marker   Patient's tumor was tested for the following markers: CA-125 Results of the tumor marker test revealed 27   05/28/2019 Tumor Marker   Patient's tumor was tested for the following markers: CA-125 Results of the tumor marker test revealed 27.7.   06/11/2019 Imaging   1. No new or progressive metastatic disease in the abdomen or pelvis. 2. Tiny noncalcified perisplenic implant is decreased. Calcified pericardiophrenic, retroperitoneal and bilateral inguinal lymph nodes and scattered small calcified perihepatic and perisplenic implants are stable. 3.  Aortic Atherosclerosis (ICD10-I70.0).       06/17/2019 Echocardiogram    1. Left ventricular ejection fraction, by estimation, is 65 to 70%. The left ventricle has normal function. The left ventricle has no regional wall motion abnormalities. Left ventricular diastolic parameters were normal.  2. Right ventricular systolic function is normal. The right ventricular size is normal.  3. The mitral valve is normal in structure and function. No evidence of mitral valve regurgitation. No evidence of mitral stenosis.  4. The aortic valve is normal in structure and function. Aortic valve regurgitation is not visualized. No aortic stenosis is present.   06/21/2019 Tumor Marker   Patient's tumor was tested for the following markers: CA-125 Results of the tumor marker test revealed 18.7   07/19/2019 Tumor Marker   Patient's tumor was tested for the following markers:  CA-125 Results of the tumor marker test revealed 16.7   08/30/2019 Tumor Marker   Patient's tumor was tested for the following markers: CA-125. Results of the tumor marker test revealed 13.9   09/15/2019 Echocardiogram    1. Left ventricular ejection fraction, by estimation, is 65 to 70%. The left ventricle has normal function. The left ventricle has no regional wall motion abnormalities. Left ventricular diastolic parameters are consistent with Grade I diastolic dysfunction (impaired relaxation). The average left ventricular global longitudinal strain is 18.1 %. The global longitudinal strain is normal.  2. Right ventricular systolic function is normal. The right ventricular size is normal.  3. The mitral valve is normal in structure. No evidence of mitral valve regurgitation. No evidence of mitral stenosis.  4. The aortic valve is normal in structure. Aortic valve regurgitation is not visualized. No aortic stenosis is present.  5. The inferior vena cava is normal in size with greater than 50% respiratory variability, suggesting right atrial pressure of 3 mmHg.     09/24/2019 Imaging   1. Stable exam. No new or progressive metastatic disease on today's study. 2. Small subcapsular hypodensity in the lateral spleen, new in the interval, but indeterminate. Attention on follow-up recommended. 3. The calcified upper abdominal and groin lymph nodes are stable as are the scattered calcified and noncalcified peritoneal implants. 4. Stable midline ventral hernia containing a short segment of colon  without complicating features. 5. Aortic Atherosclerosis (ICD10-I70.0).     10/04/2019 -  Chemotherapy   The patient had bevacizumab-bvzr (ZIRABEV) 1,200 mg in sodium chloride 0.9 % 100 mL chemo infusion, 15 mg/kg = 1,200 mg, Intravenous,  Once, 0 of 4 cycles  for chemotherapy treatment.      REVIEW OF SYSTEMS:   Constitutional: Denies fevers, chills or abnormal weight loss Eyes: Denies blurriness of  vision Ears, nose, mouth, throat, and face: Denies mucositis or sore throat Respiratory: Denies cough, dyspnea or wheezes Cardiovascular: Denies palpitation, chest discomfort or lower extremity swelling Gastrointestinal:  Denies nausea, heartburn or change in bowel habits Skin: Denies abnormal skin rashes Lymphatics: Denies new lymphadenopathy or easy bruising Neurological:Denies numbness, tingling or new weaknesses Behavioral/Psych: Mood is stable, no new changes  All other systems were reviewed with the patient and are negative.  I have reviewed the past medical history, past surgical history, social history and family history with the patient and they are unchanged from previous note.  ALLERGIES:  is allergic to dilaudid [hydromorphone hcl]; morphine and related; and sudafed [pseudoephedrine hcl].  MEDICATIONS:  Current Outpatient Medications  Medication Sig Dispense Refill   acetaminophen (TYLENOL) 325 MG tablet Take 975 mg by mouth every 6 (six) hours as needed (pain).     amoxicillin (AMOXIL) 500 MG capsule Take 1 capsule (500 mg total) by mouth 2 (two) times daily. 10 capsule 0   HYDROcodone-acetaminophen (NORCO/VICODIN) 5-325 MG tablet Take 1 tablet by mouth every 6 (six) hours as needed.     lidocaine-prilocaine (EMLA) cream APP EXT AA 1 TIME     magic mouthwash w/lidocaine SOLN Take 5 mLs by mouth 4 (four) times daily. Take 5 ml's 4 x daily for 7 days. Swish and spit. 140 mL 0   methylPREDNISolone (MEDROL DOSEPAK) 4 MG TBPK tablet Take 4 mg by mouth See admin instructions. follow package directions     ondansetron (ZOFRAN) 8 MG tablet Take 1 tablet (8 mg total) by mouth every 8 (eight) hours as needed. 30 tablet 1   prochlorperazine (COMPAZINE) 10 MG tablet TAKE 1 TABLET BY MOUTH EVERY 6 HOURS AS NEEDED FOR NAUSEA AND/OR VOMITING 30 tablet 1   promethazine (PHENERGAN) 25 MG suppository Place 1 suppository (25 mg total) rectally 2 (two) times daily as needed for nausea or  vomiting. 12 each 0   traMADol (ULTRAM) 50 MG tablet Take 50 mg by mouth 3 (three) times daily as needed.     venlafaxine XR (EFFEXOR-XR) 37.5 MG 24 hr capsule TAKE 1 CAPSULE BY MOUTH DAILY WITH BREAKFAST (Patient taking differently: Take 37.5 mg by mouth daily with breakfast. ) 90 capsule 6   No current facility-administered medications for this visit.   Facility-Administered Medications Ordered in Other Visits  Medication Dose Route Frequency Provider Last Rate Last Admin   sodium chloride flush (NS) 0.9 % injection 10 mL  10 mL Intravenous PRN Alvy Bimler, Brodan Grewell, MD   10 mL at 02/11/17 0839   sodium chloride flush (NS) 0.9 % injection 10 mL  10 mL Intravenous PRN Alvy Bimler, Cornelius Marullo, MD   10 mL at 03/04/17 1510    PHYSICAL EXAMINATION: ECOG PERFORMANCE STATUS: 0 - Asymptomatic  Vitals:   09/27/19 1140  BP: (!) 150/60  Pulse: 81  Resp: 18  Temp: 97.7 F (36.5 C)  SpO2: 100%   Filed Weights   09/27/19 1140  Weight: 170 lb 3.2 oz (77.2 kg)    GENERAL:alert, no distress and comfortable NEURO: alert & oriented x 3  with fluent speech, no focal motor/sensory deficits  LABORATORY DATA:  I have reviewed the data as listed    Component Value Date/Time   NA 141 08/30/2019 0815   NA 139 04/14/2017 0742   K 3.7 08/30/2019 0815   K 4.0 04/14/2017 0742   CL 109 08/30/2019 0815   CO2 22 08/30/2019 0815   CO2 21 (L) 04/14/2017 0742   GLUCOSE 145 (H) 08/30/2019 0815   GLUCOSE 247 (H) 04/14/2017 0742   BUN 7 (L) 08/30/2019 0815   BUN 13.1 04/14/2017 0742   CREATININE 0.86 08/30/2019 0815   CREATININE 0.87 07/19/2019 0951   CREATININE 0.9 04/14/2017 0742   CALCIUM 8.9 08/30/2019 0815   CALCIUM 9.2 04/14/2017 0742   PROT 6.6 08/30/2019 0815   PROT 7.3 04/14/2017 0742   ALBUMIN 3.5 08/30/2019 0815   ALBUMIN 3.9 04/14/2017 0742   AST 14 (L) 08/30/2019 0815   AST 21 07/19/2019 0951   AST 17 04/14/2017 0742   ALT 10 08/30/2019 0815   ALT 13 07/19/2019 0951   ALT 14 04/14/2017 0742    ALKPHOS 77 08/30/2019 0815   ALKPHOS 70 04/14/2017 0742   BILITOT 0.4 08/30/2019 0815   BILITOT 0.3 07/19/2019 0951   BILITOT 0.37 04/14/2017 0742   GFRNONAA >60 08/30/2019 0815   GFRNONAA >60 07/19/2019 0951   GFRAA >60 08/30/2019 0815   GFRAA >60 07/19/2019 0951    No results found for: SPEP, UPEP  Lab Results  Component Value Date   WBC 3.8 (L) 08/30/2019   NEUTROABS 1.8 08/30/2019   HGB 10.2 (L) 08/30/2019   HCT 31.7 (L) 08/30/2019   MCV 103.3 (H) 08/30/2019   PLT 248 08/30/2019      Chemistry      Component Value Date/Time   NA 141 08/30/2019 0815   NA 139 04/14/2017 0742   K 3.7 08/30/2019 0815   K 4.0 04/14/2017 0742   CL 109 08/30/2019 0815   CO2 22 08/30/2019 0815   CO2 21 (L) 04/14/2017 0742   BUN 7 (L) 08/30/2019 0815   BUN 13.1 04/14/2017 0742   CREATININE 0.86 08/30/2019 0815   CREATININE 0.87 07/19/2019 0951   CREATININE 0.9 04/14/2017 0742      Component Value Date/Time   CALCIUM 8.9 08/30/2019 0815   CALCIUM 9.2 04/14/2017 0742   ALKPHOS 77 08/30/2019 0815   ALKPHOS 70 04/14/2017 0742   AST 14 (L) 08/30/2019 0815   AST 21 07/19/2019 0951   AST 17 04/14/2017 0742   ALT 10 08/30/2019 0815   ALT 13 07/19/2019 0951   ALT 14 04/14/2017 0742   BILITOT 0.4 08/30/2019 0815   BILITOT 0.3 07/19/2019 0951   BILITOT 0.37 04/14/2017 0742       RADIOGRAPHIC STUDIES: I have reviewed multiple imaging studies with the patient and her husband I have personally reviewed the radiological images as listed and agreed with the findings in the report. CT ABDOMEN PELVIS W CONTRAST  Result Date: 09/24/2019 CLINICAL DATA:  Ovarian cancer.  Restaging. EXAM: CT ABDOMEN AND PELVIS WITH CONTRAST TECHNIQUE: Multidetector CT imaging of the abdomen and pelvis was performed using the standard protocol following bolus administration of intravenous contrast. CONTRAST:  132m OMNIPAQUE IOHEXOL 300 MG/ML  SOLN COMPARISON:  06/11/2019 FINDINGS: Lower chest: Unremarkable.  Hepatobiliary: No focal abnormality within the liver parenchyma. Capsular implant on the posterior hepatic dome is stable at 9 mm (21/2) calcified implant along the falciform ligament is unchanged. There is no evidence for gallstones, gallbladder wall  thickening, or pericholecystic fluid. No intrahepatic or extrahepatic biliary dilation. Pancreas: No focal mass lesion. No dilatation of the main duct. No intraparenchymal cyst. No peripancreatic edema. Spleen: Small subcapsular hypodensity in the lateral spleen measures 6 mm, new in the interval, but indeterminate. Tiny perisplenic nodular peritoneal and capsular implants are stable including previously measured index 5 mm nodule on 29/2. Adrenals/Urinary Tract: No adrenal nodule or mass. Kidneys unremarkable. No evidence for hydroureter. The urinary bladder appears normal for the degree of distention. Stomach/Bowel: Postsurgical change in the gastric cardia consistent with prior fundoplication. Stomach otherwise unremarkable. Duodenum is normally positioned as is the ligament of Treitz. No small bowel wall thickening. No small bowel dilatation. The terminal ileum is normal. The appendix is normal. Prominent stool volume right and transverse colon. Vascular/Lymphatic: There is abdominal aortic atherosclerosis without aneurysm. Calcific nodal tissue in the gastrohepatic and hepatoduodenal ligaments is stable. Index portacaval node measured previously at 10 mm short axis is stable (29/2) the small calcified para-aortic lymph nodes are unchanged. Stable appearance of the borderline enlarged bilateral groin nodes. Right index left inguinal node measured previously at 11 mm short axis is unchanged on image 80/2 today. Reproductive: Uterus surgically absent.  There is no adnexal mass. Other: No intraperitoneal free fluid. Peritoneal thickening in the cul-de-sac is similar. Musculoskeletal: Infraumbilical midline anterior abdominal wall hernia contains a segment of  transverse colon without complicating features and similar to prior. No worrisome lytic or sclerotic osseous abnormality. IMPRESSION: 1. Stable exam. No new or progressive metastatic disease on today's study. 2. Small subcapsular hypodensity in the lateral spleen, new in the interval, but indeterminate. Attention on follow-up recommended. 3. The calcified upper abdominal and groin lymph nodes are stable as are the scattered calcified and noncalcified peritoneal implants. 4. Stable midline ventral hernia containing a short segment of colon without complicating features. 5. Aortic Atherosclerosis (ICD10-I70.0). Electronically Signed   By: Misty Stanley M.D.   On: 09/24/2019 13:15   ECHOCARDIOGRAM COMPLETE  Result Date: 09/15/2019    ECHOCARDIOGRAM REPORT   Patient Name:   Holly [name] Date of Exam: 09/15/2019 Medical Rec #:  [id number]     Height:       65.0 in Accession #:    [id number]    Weight:       171.2 lb Date of Birth:  1956/01/20     BSA:          1.852 m Patient Age:    17 years      BP:           160/80 mmHg Patient Gender: F             HR:           67 bpm. Exam Location:  Outpatient Procedure: 2D Echo Indications:    chemotherapy evaluation  History:        Patient has prior history of Echocardiogram examinations, most                 recent 06/17/2019. Risk Factors:Hypertension. Cancer.  Sonographer:    Jannett Celestine RDCS (AE) Referring Phys: [phone] [facility] [place]  1. Left ventricular ejection fraction, by estimation, is 65 to 70%. The left ventricle has normal function. The left ventricle has no regional wall motion abnormalities. Left ventricular diastolic parameters are consistent with Grade I diastolic dysfunction (impaired relaxation). The average left ventricular global longitudinal strain is 18.1 %. The global longitudinal strain is normal.  2. Right ventricular systolic function is normal.  The right ventricular size is normal.  3. The mitral valve is normal in structure. No evidence  of mitral valve regurgitation. No evidence of mitral stenosis.  4. The aortic valve is normal in structure. Aortic valve regurgitation is not visualized. No aortic stenosis is present.  5. The inferior vena cava is normal in size with greater than 50% respiratory variability, suggesting right atrial pressure of 3 mmHg. Comparison(s): No significant change from prior study. Prior images reviewed side by side. FINDINGS  Left Ventricle: Left ventricular ejection fraction, by estimation, is 65 to 70%. The left ventricle has normal function. The left ventricle has no regional wall motion abnormalities. The average left ventricular global longitudinal strain is 18.1 %. The  global longitudinal strain is normal. The left ventricular internal cavity size was normal in size. There is no left ventricular hypertrophy. Left ventricular diastolic parameters are consistent with Grade I diastolic dysfunction (impaired relaxation). Right Ventricle: The right ventricular size is normal. No increase in right ventricular wall thickness. Right ventricular systolic function is normal. Left Atrium: Left atrial size was normal in size. Right Atrium: Right atrial size was normal in size. Pericardium: Trivial pericardial effusion is present. The pericardial effusion is surrounding the apex and anterior to the right ventricle. Presence of pericardial fat pad. Mitral Valve: The mitral valve is normal in structure. Normal mobility of the mitral valve leaflets. No evidence of mitral valve regurgitation. No evidence of mitral valve stenosis. Tricuspid Valve: The tricuspid valve is normal in structure. Tricuspid valve regurgitation is not demonstrated. No evidence of tricuspid stenosis. Aortic Valve: The aortic valve is normal in structure. Aortic valve regurgitation is not visualized. No aortic stenosis is present. Pulmonic Valve: The pulmonic valve was normal in structure. Pulmonic valve regurgitation is not visualized. No evidence of pulmonic  stenosis. Aorta: The aortic root is normal in size and structure. There is minimal (Grade I) plaque involving the aortic root. Venous: The inferior vena cava is normal in size with greater than 50% respiratory variability, suggesting right atrial pressure of 3 mmHg. IAS/Shunts: No atrial level shunt detected by color flow Doppler.  LEFT VENTRICLE PLAX 2D LVIDd:         4.20 cm  Diastology LVIDs:         2.93 cm  LV e' lateral:   12.20 cm/s LV PW:         1.10 cm  LV E/e' lateral: 8.1 LV IVS:        1.10 cm  LV e' medial:    9.03 cm/s LVOT diam:     1.90 cm  LV E/e' medial:  10.9 LV SV:         60 LV SV Index:   32       2D Longitudinal Strain LVOT Area:     2.84 cm 2D Strain GLS Avg:     18.1 %  RIGHT VENTRICLE RV S prime:     12.30 cm/s TAPSE (M-mode): 2.3 cm LEFT ATRIUM           Index       RIGHT ATRIUM          Index LA diam:      3.30 cm 1.78 cm/m  RA Area:     9.52 cm LA Vol (A4C): 21.3 ml 11.50 ml/m RA Volume:   17.30 ml 9.34 ml/m  AORTIC VALVE LVOT Vmax:   96.00 cm/s LVOT Vmean:  70.500 cm/s LVOT VTI:    0.210 m  AORTA Ao  Root diam: 2.90 cm MITRAL VALVE MV Area (PHT): 3.54 cm    SHUNTS MV Decel Time: 214 msec    Systemic VTI:  0.21 m MV E velocity: 98.50 cm/s  Systemic Diam: 1.90 cm MV A velocity: 80.80 cm/s MV E/A ratio:  1.22 Candee Furbish MD Electronically signed by Candee Furbish MD Signature Date/Time: 09/15/2019/1:01:37 PM    Final

## 2019-09-28 NOTE — Telephone Encounter (Signed)
Scheduled per 6/8 sch message. Pt aware of appts added on 7/12

## 2019-09-28 NOTE — Assessment & Plan Note (Signed)
I have reviewed multiple imaging studies with the patient and her husband She has achieved complete response to therapy We discussed the role of maintenance treatment We discussed the risk, benefits, side effects of maintenance bevacizumab versus niraparib Ultimately, she has made informed decision to proceed with bevacizumab She is aware about the need for aggressive blood pressure monitoring while on treatment She is also aware that she needs to inform me whenever she plans to undergo any kind of elective surgical procedure while on bevacizumab I will get her started next week I will see her back next month prior to cycle 2 of treatment for toxicity review

## 2019-10-04 ENCOUNTER — Inpatient Hospital Stay: Payer: BC Managed Care – PPO

## 2019-10-04 ENCOUNTER — Other Ambulatory Visit: Payer: Self-pay

## 2019-10-04 VITALS — BP 130/69 | HR 68 | Temp 98.0°F | Resp 18

## 2019-10-04 DIAGNOSIS — Z5111 Encounter for antineoplastic chemotherapy: Secondary | ICD-10-CM | POA: Diagnosis not present

## 2019-10-04 DIAGNOSIS — C561 Malignant neoplasm of right ovary: Secondary | ICD-10-CM | POA: Diagnosis not present

## 2019-10-04 DIAGNOSIS — C562 Malignant neoplasm of left ovary: Secondary | ICD-10-CM | POA: Diagnosis not present

## 2019-10-04 DIAGNOSIS — Z79899 Other long term (current) drug therapy: Secondary | ICD-10-CM | POA: Diagnosis not present

## 2019-10-04 DIAGNOSIS — C569 Malignant neoplasm of unspecified ovary: Secondary | ICD-10-CM

## 2019-10-04 DIAGNOSIS — Z7189 Other specified counseling: Secondary | ICD-10-CM

## 2019-10-04 DIAGNOSIS — I10 Essential (primary) hypertension: Secondary | ICD-10-CM | POA: Diagnosis not present

## 2019-10-04 DIAGNOSIS — C5701 Malignant neoplasm of right fallopian tube: Secondary | ICD-10-CM

## 2019-10-04 DIAGNOSIS — Z5112 Encounter for antineoplastic immunotherapy: Secondary | ICD-10-CM | POA: Diagnosis not present

## 2019-10-04 DIAGNOSIS — C786 Secondary malignant neoplasm of retroperitoneum and peritoneum: Secondary | ICD-10-CM | POA: Diagnosis not present

## 2019-10-04 LAB — TOTAL PROTEIN, URINE DIPSTICK: Protein, ur: NEGATIVE mg/dL

## 2019-10-04 LAB — CBC WITH DIFFERENTIAL (CANCER CENTER ONLY)
Abs Immature Granulocytes: 0.02 10*3/uL (ref 0.00–0.07)
Basophils Absolute: 0 10*3/uL (ref 0.0–0.1)
Basophils Relative: 1 %
Eosinophils Absolute: 0.1 10*3/uL (ref 0.0–0.5)
Eosinophils Relative: 2 %
HCT: 30.8 % — ABNORMAL LOW (ref 36.0–46.0)
Hemoglobin: 10.1 g/dL — ABNORMAL LOW (ref 12.0–15.0)
Immature Granulocytes: 1 %
Lymphocytes Relative: 33 %
Lymphs Abs: 1.2 10*3/uL (ref 0.7–4.0)
MCH: 33.3 pg (ref 26.0–34.0)
MCHC: 32.8 g/dL (ref 30.0–36.0)
MCV: 101.7 fL — ABNORMAL HIGH (ref 80.0–100.0)
Monocytes Absolute: 0.4 10*3/uL (ref 0.1–1.0)
Monocytes Relative: 11 %
Neutro Abs: 1.8 10*3/uL (ref 1.7–7.7)
Neutrophils Relative %: 52 %
Platelet Count: 227 10*3/uL (ref 150–400)
RBC: 3.03 MIL/uL — ABNORMAL LOW (ref 3.87–5.11)
RDW: 15.8 % — ABNORMAL HIGH (ref 11.5–15.5)
WBC Count: 3.4 10*3/uL — ABNORMAL LOW (ref 4.0–10.5)
nRBC: 0 % (ref 0.0–0.2)

## 2019-10-04 LAB — COMPREHENSIVE METABOLIC PANEL
ALT: 9 U/L (ref 0–44)
AST: 12 U/L — ABNORMAL LOW (ref 15–41)
Albumin: 3.6 g/dL (ref 3.5–5.0)
Alkaline Phosphatase: 72 U/L (ref 38–126)
Anion gap: 13 (ref 5–15)
BUN: 10 mg/dL (ref 8–23)
CO2: 23 mmol/L (ref 22–32)
Calcium: 8.9 mg/dL (ref 8.9–10.3)
Chloride: 107 mmol/L (ref 98–111)
Creatinine, Ser: 0.95 mg/dL (ref 0.44–1.00)
GFR calc Af Amer: >60 mL/min (ref >60)
GFR calc non Af Amer: >60 mL/min (ref >60)
Glucose, Bld: 131 mg/dL — ABNORMAL HIGH (ref 70–99)
Potassium: 3.6 mmol/L (ref 3.5–5.1)
Sodium: 143 mmol/L (ref 135–145)
Total Bilirubin: 0.4 mg/dL (ref 0.3–1.2)
Total Protein: 6.3 g/dL — ABNORMAL LOW (ref 6.5–8.1)

## 2019-10-04 MED ORDER — HEPARIN SOD (PORK) LOCK FLUSH 100 UNIT/ML IV SOLN
500.0000 [IU] | Freq: Once | INTRAVENOUS | Status: AC | PRN
  Administered 2019-10-04: 500 [IU]
  Filled 2019-10-04: qty 5

## 2019-10-04 MED ORDER — SODIUM CHLORIDE 0.9 % IV SOLN
15.0000 mg/kg | Freq: Once | INTRAVENOUS | Status: AC
  Administered 2019-10-04: 1200 mg via INTRAVENOUS
  Filled 2019-10-04: qty 48

## 2019-10-04 MED ORDER — SODIUM CHLORIDE 0.9% FLUSH
10.0000 mL | INTRAVENOUS | Status: DC | PRN
  Administered 2019-10-04: 10 mL
  Filled 2019-10-04: qty 10

## 2019-10-04 MED ORDER — SODIUM CHLORIDE 0.9 % IV SOLN
Freq: Once | INTRAVENOUS | Status: AC
  Filled 2019-10-04: qty 250

## 2019-10-04 NOTE — Patient Instructions (Signed)
[facility] Discharge Instructions for Patients Receiving Chemotherapy  Today you received the following chemotherapy agents bevacizumab  To help prevent nausea and vomiting after your treatment, we encourage you to take your nausea medication as prescribed.   If you develop nausea and vomiting that is not controlled by your nausea medication, call the clinic.   BELOW ARE SYMPTOMS THAT SHOULD BE REPORTED IMMEDIATELY:  *FEVER GREATER THAN 100.5 F  *CHILLS WITH OR WITHOUT FEVER  NAUSEA AND VOMITING THAT IS NOT CONTROLLED WITH YOUR NAUSEA MEDICATION  *UNUSUAL SHORTNESS OF BREATH  *UNUSUAL BRUISING OR BLEEDING  TENDERNESS IN MOUTH AND THROAT WITH OR WITHOUT PRESENCE OF ULCERS  *URINARY PROBLEMS  *BOWEL PROBLEMS  UNUSUAL RASH Items with * indicate a potential emergency and should be followed up as soon as possible.  Feel free to call the clinic should you have any questions or concerns. The clinic phone number is [phone].  Please show the [facility] at check-in to the Emergency Department and triage nurse.  Bevacizumab injection What is this medicine? BEVACIZUMAB (be va SIZ yoo mab) is a monoclonal antibody. It is used to treat many types of cancer. This medicine may be used for other purposes; ask your health care provider or pharmacist if you have questions. COMMON BRAND NAME(S): Avastin, MVASI, Zirabev What should I tell my health care provider before I take this medicine? They need to know if you have any of these conditions:  diabetes  heart disease  high blood pressure  history of coughing up blood  prior anthracycline chemotherapy (e.g., doxorubicin, daunorubicin, epirubicin)  recent or ongoing radiation therapy  recent or planning to have surgery  stroke  an unusual or allergic reaction to bevacizumab, hamster proteins, mouse proteins, other medicines, foods, dyes, or preservatives  pregnant or trying to get  pregnant  breast-feeding How should I use this medicine? This medicine is for infusion into a vein. It is given by a health care professional in a hospital or clinic setting. Talk to your pediatrician regarding the use of this medicine in children. Special care may be needed. Overdosage: If you think you have taken too much of this medicine contact a poison control center or emergency room at once. NOTE: This medicine is only for you. Do not share this medicine with others. What if I miss a dose? It is important not to miss your dose. Call your doctor or health care professional if you are unable to keep an appointment. What may interact with this medicine? Interactions are not expected. This list may not describe all possible interactions. Give your health care provider a list of all the medicines, herbs, non-prescription drugs, or dietary supplements you use. Also tell them if you smoke, drink alcohol, or use illegal drugs. Some items may interact with your medicine. What should I watch for while using this medicine? Your condition will be monitored carefully while you are receiving this medicine. You will need important blood work and urine testing done while you are taking this medicine. This medicine may increase your risk to bruise or bleed. Call your doctor or health care professional if you notice any unusual bleeding. Before having surgery, talk to your health care provider to make sure it is ok. This drug can increase the risk of poor healing of your surgical site or wound. You will need to stop this drug for 28 days before surgery. After surgery, wait at least 28 days before restarting this drug. Make sure the surgical  site or wound is healed enough before restarting this drug. Talk to your health care provider if questions. Do not become pregnant while taking this medicine or for 6 months after stopping it. Women should inform their doctor if they wish to become pregnant or think they  might be pregnant. There is a potential for serious side effects to an unborn child. Talk to your health care professional or pharmacist for more information. Do not breast-feed an infant while taking this medicine and for 6 months after the last dose. This medicine has caused ovarian failure in some women. This medicine may interfere with the ability to have a child. You should talk to your doctor or health care professional if you are concerned about your fertility. What side effects may I notice from receiving this medicine? Side effects that you should report to your doctor or health care professional as soon as possible:  allergic reactions like skin rash, itching or hives, swelling of the face, lips, or tongue  chest pain or chest tightness  chills  coughing up blood  high fever  seizures  severe constipation  signs and symptoms of bleeding such as bloody or black, tarry stools; red or dark-brown urine; spitting up blood or brown material that looks like coffee grounds; red spots on the skin; unusual bruising or bleeding from the eye, gums, or nose  signs and symptoms of a blood clot such as breathing problems; chest pain; severe, sudden headache; pain, swelling, warmth in the leg  signs and symptoms of a stroke like changes in vision; confusion; trouble speaking or understanding; severe headaches; sudden numbness or weakness of the face, arm or leg; trouble walking; dizziness; loss of balance or coordination  stomach pain  sweating  swelling of legs or ankles  vomiting  weight gain Side effects that usually do not require medical attention (report to your doctor or health care professional if they continue or are bothersome):  back pain  changes in taste  decreased appetite  dry skin  nausea  tiredness This list may not describe all possible side effects. Call your doctor for medical advice about side effects. You may report side effects to FDA at  1-800-FDA-1088. Where should I keep my medicine? This drug is given in a hospital or clinic and will not be stored at home. NOTE: This sheet is a summary. It may not cover all possible information. If you have questions about this medicine, talk to your doctor, pharmacist, or health care provider.  2020 Elsevier/Gold Standard (2019-02-03 10:50:46)

## 2019-10-05 ENCOUNTER — Telehealth: Payer: Self-pay

## 2019-10-05 LAB — CA 125: Cancer Antigen (CA) 125: 12.9 U/mL (ref 0.0–38.1)

## 2019-10-05 NOTE — Telephone Encounter (Signed)
Called to follow up on new treatment yesterday. She is doing well. She called the after number last night regarding blood pressure readings. 6/14 at 1830 bp-160/92, 19:02 bp- 148/91, 2100 bp-159/80 and 23:09 bp 140/81. 6/15 am 134/87. No other complaints. Instructed to watch salt intake. She verbalize understanding.

## 2019-10-05 NOTE — Telephone Encounter (Signed)
-----   Message from Rolene Course, RN sent at 10/04/2019 10:17 AM EDT ----- Regarding: Holly [name] 1st Tx F/U call - bevacizumab [place] 1st Tx F/U call - bevacizumab

## 2019-10-06 ENCOUNTER — Telehealth: Payer: Self-pay | Admitting: *Deleted

## 2019-10-06 NOTE — Telephone Encounter (Signed)
Pt called with updated bp 152/86. Left VM on pt personal cell, advising to continue to watch salt intake and drink fluids. Pt verbalized understanding.

## 2019-10-13 ENCOUNTER — Telehealth: Payer: Self-pay | Admitting: Oncology

## 2019-10-13 NOTE — Telephone Encounter (Signed)
Holly [name] called and said she has a disability form that needs to be completed and wanted to know what to do.  Advised her she can either drop it off at the [facility] front desk or she can email it.  She would like to use email so she was provided with the [facility] email address.

## 2019-10-21 ENCOUNTER — Telehealth: Payer: Self-pay

## 2019-10-21 NOTE — Telephone Encounter (Signed)
She called to see if the office received her disablity paper work. She sent the paper work to the e-mail address provided earlier.  Called back and left a message. That e-mail is not working and the office did not receive the paper work.. Ask her to either fax paper work to the office, drop off the paper work out front or send via [employer]. Ask her to call the office back if needed.

## 2019-10-21 NOTE — Telephone Encounter (Signed)
She called back and will drop off the disability paper work out front.

## 2019-10-22 ENCOUNTER — Telehealth: Payer: Self-pay

## 2019-10-22 NOTE — Telephone Encounter (Signed)
Faxed disability form to [phone]. Received confirmation. Mailed original to patient. [place] notified of above.

## 2019-10-27 ENCOUNTER — Other Ambulatory Visit: Payer: Self-pay | Admitting: Hematology and Oncology

## 2019-10-27 ENCOUNTER — Telehealth: Payer: Self-pay

## 2019-10-27 DIAGNOSIS — I10 Essential (primary) hypertension: Secondary | ICD-10-CM

## 2019-10-27 MED ORDER — AMLODIPINE BESYLATE 10 MG PO TABS: 10.0000 mg | ORAL_TABLET | Freq: Every day | ORAL | 9 refills | Status: DC

## 2019-10-27 NOTE — Telephone Encounter (Signed)
Per Dr Alvy Bimler called patient and let her know that Dr Alvy Bimler sent prescription of amlodipine 10 mg to her [place]. Instructed her to trake 1 daily in the evening. Patient verbalized understanding.

## 2019-10-27 NOTE — Telephone Encounter (Signed)
Received call from patient wanting to let Dr Alvy Bimler know about her blood pressure readings from yesterday. Patient stated that she took her BP three times yesterday and they were all elevated readings ( 185/110, 183/99, 182/111). However, when she took her BP this morning it was 139/92. Patient stated that she was feeling fine and she did speak with Dr Alvy Bimler about her Avastin treatment side effect could be elevated BP so she just wanted to let Dr Alvy Bimler   know what occurred. Let patient know that Dr Alvy Bimler was not in today but I would make sure to get this message over to her. Patient verbalized understanding.

## 2019-10-28 ENCOUNTER — Encounter: Payer: Self-pay | Admitting: Hematology and Oncology

## 2019-11-01 ENCOUNTER — Other Ambulatory Visit: Payer: Self-pay

## 2019-11-01 ENCOUNTER — Inpatient Hospital Stay: Payer: BC Managed Care – PPO

## 2019-11-01 ENCOUNTER — Inpatient Hospital Stay: Payer: BC Managed Care – PPO | Attending: Hematology and Oncology | Admitting: Hematology and Oncology

## 2019-11-01 ENCOUNTER — Encounter: Payer: Self-pay | Admitting: Hematology and Oncology

## 2019-11-01 DIAGNOSIS — Z79899 Other long term (current) drug therapy: Secondary | ICD-10-CM | POA: Insufficient documentation

## 2019-11-01 DIAGNOSIS — T451X5A Adverse effect of antineoplastic and immunosuppressive drugs, initial encounter: Secondary | ICD-10-CM

## 2019-11-01 DIAGNOSIS — Z5112 Encounter for antineoplastic immunotherapy: Secondary | ICD-10-CM | POA: Diagnosis not present

## 2019-11-01 DIAGNOSIS — D6481 Anemia due to antineoplastic chemotherapy: Secondary | ICD-10-CM

## 2019-11-01 DIAGNOSIS — C5701 Malignant neoplasm of right fallopian tube: Secondary | ICD-10-CM

## 2019-11-01 DIAGNOSIS — Z7189 Other specified counseling: Secondary | ICD-10-CM

## 2019-11-01 DIAGNOSIS — I10 Essential (primary) hypertension: Secondary | ICD-10-CM | POA: Diagnosis not present

## 2019-11-01 DIAGNOSIS — C569 Malignant neoplasm of unspecified ovary: Secondary | ICD-10-CM

## 2019-11-01 DIAGNOSIS — C562 Malignant neoplasm of left ovary: Secondary | ICD-10-CM

## 2019-11-01 LAB — CBC WITH DIFFERENTIAL (CANCER CENTER ONLY)
Abs Immature Granulocytes: 0.01 10*3/uL (ref 0.00–0.07)
Basophils Absolute: 0 10*3/uL (ref 0.0–0.1)
Basophils Relative: 1 %
Eosinophils Absolute: 0.3 10*3/uL (ref 0.0–0.5)
Eosinophils Relative: 7 %
HCT: 36.3 % (ref 36.0–46.0)
Hemoglobin: 11.9 g/dL — ABNORMAL LOW (ref 12.0–15.0)
Immature Granulocytes: 0 %
Lymphocytes Relative: 31 %
Lymphs Abs: 1.5 10*3/uL (ref 0.7–4.0)
MCH: 32.2 pg (ref 26.0–34.0)
MCHC: 32.8 g/dL (ref 30.0–36.0)
MCV: 98.1 fL (ref 80.0–100.0)
Monocytes Absolute: 0.4 10*3/uL (ref 0.1–1.0)
Monocytes Relative: 9 %
Neutro Abs: 2.5 10*3/uL (ref 1.7–7.7)
Neutrophils Relative %: 52 %
Platelet Count: 181 10*3/uL (ref 150–400)
RBC: 3.7 MIL/uL — ABNORMAL LOW (ref 3.87–5.11)
RDW: 13.6 % (ref 11.5–15.5)
WBC Count: 4.7 10*3/uL (ref 4.0–10.5)
nRBC: 0 % (ref 0.0–0.2)

## 2019-11-01 LAB — COMPREHENSIVE METABOLIC PANEL
ALT: 12 U/L (ref 0–44)
AST: 15 U/L (ref 15–41)
Albumin: 3.8 g/dL (ref 3.5–5.0)
Alkaline Phosphatase: 85 U/L (ref 38–126)
Anion gap: 9 (ref 5–15)
BUN: 14 mg/dL (ref 8–23)
CO2: 24 mmol/L (ref 22–32)
Calcium: 9.4 mg/dL (ref 8.9–10.3)
Chloride: 107 mmol/L (ref 98–111)
Creatinine, Ser: 0.98 mg/dL (ref 0.44–1.00)
GFR calc Af Amer: >60 mL/min (ref >60)
GFR calc non Af Amer: >60 mL/min (ref >60)
Glucose, Bld: 137 mg/dL — ABNORMAL HIGH (ref 70–99)
Potassium: 4.2 mmol/L (ref 3.5–5.1)
Sodium: 140 mmol/L (ref 135–145)
Total Bilirubin: 0.6 mg/dL (ref 0.3–1.2)
Total Protein: 7.2 g/dL (ref 6.5–8.1)

## 2019-11-01 LAB — TOTAL PROTEIN, URINE DIPSTICK: Protein, ur: NEGATIVE mg/dL

## 2019-11-01 MED ORDER — SODIUM CHLORIDE 0.9% FLUSH
10.0000 mL | Freq: Once | INTRAVENOUS | Status: AC
  Administered 2019-11-01: 10 mL
  Filled 2019-11-01: qty 10

## 2019-11-01 MED ORDER — HEPARIN SOD (PORK) LOCK FLUSH 100 UNIT/ML IV SOLN
500.0000 [IU] | Freq: Once | INTRAVENOUS | Status: AC | PRN
  Administered 2019-11-01: 500 [IU]
  Filled 2019-11-01: qty 5

## 2019-11-01 MED ORDER — SODIUM CHLORIDE 0.9 % IV SOLN
15.0000 mg/kg | Freq: Once | INTRAVENOUS | Status: AC
  Administered 2019-11-01: 1200 mg via INTRAVENOUS
  Filled 2019-11-01: qty 48

## 2019-11-01 MED ORDER — SODIUM CHLORIDE 0.9 % IV SOLN
Freq: Once | INTRAVENOUS | Status: AC
  Filled 2019-11-01: qty 250

## 2019-11-01 MED ORDER — SODIUM CHLORIDE 0.9% FLUSH
10.0000 mL | INTRAVENOUS | Status: DC | PRN
  Administered 2019-11-01: 10 mL
  Filled 2019-11-01: qty 10

## 2019-11-01 NOTE — Assessment & Plan Note (Signed)
She has achieved complete response to therapy We discussed the role of maintenance treatment So far, she tolerated bevacizumab well Her blood pressure is satisfactory She will continue treatment every 3 weeks as scheduled

## 2019-11-01 NOTE — Assessment & Plan Note (Signed)
Her pancytopenia has almost resolved since discontinuation of chemotherapy Observe for now 

## 2019-11-01 NOTE — Progress Notes (Signed)
[place] OFFICE PROGRESS NOTE  Patient Care Team: Gaynelle Arabian, MD as PCP - General (Family Medicine) Heath Lark, MD as Consulting Physician (Hematology and Oncology) Everitt Amber, MD as Consulting Physician (Obstetrics and Gynecology)  ASSESSMENT & PLAN:  Adenocarcinoma of right fallopian tube [facility]) She has achieved complete response to therapy We discussed the role of maintenance treatment So far, she tolerated bevacizumab well Her blood pressure is satisfactory She will continue treatment every 3 weeks as scheduled  Anemia due to antineoplastic chemotherapy Her pancytopenia has almost resolved since discontinuation of chemotherapy Observe for now  Essential hypertension Her blood pressure is stable Observe only for now   No orders of the defined types were placed in this encounter.   All questions were answered. The patient knows to call the clinic with any problems, questions or concerns. The total time spent in the appointment was 20 minutes encounter with patients including review of chart and various tests results, discussions about plan of care and coordination of care plan   Heath Lark, MD 11/01/2019 12:20 PM  INTERVAL HISTORY: Please see below for problem oriented charting. She returns for cycle 2 of chemotherapy with her husband She tolerated bevacizumab well No significant elevated blood pressure recently No recent dental issues Denies abdominal pain or changes in bowel habits She feels well  SUMMARY OF ONCOLOGIC HISTORY: Oncology History Overview Note  Neg genetics   Adenocarcinoma of right fallopian tube ([place])  08/12/2016 Initial Diagnosis   The patient has many months of vague symptoms of fullness in the pelvis, urinary frequency and difficulty with defecation. She denies vaginal bleeding or rectal bleeding.    08/12/2016 Imaging   Abdomen X-ray in the ER: Moderate colonic stool burden without evidence of enteric obstruction    11/13/2016 Imaging   TVUS was performed on 11/13/16 which showed a large hypoechoic lobular mass seen posterior to the uterus measuring 18.2x16.1x11.8cm, blood flow was seen along the periphery of the mass. It was considered to be likely to be a fibroid vs pelvic mass vs ovarian mass. Neither ovary was removed. Moderate amount of free fluid in the pelvis. THe uterus measures 4x10x4cm. The endometrium is 87m.    12/05/2016 Imaging   MR pelvis 1. Mild limitations as detailed above. 2. Heterogeneous pelvic mass is favored to arise from the right ovary. Favor a solid ovarian neoplasm such as fibroma/Brenner's tumor. Given lesion size and heterogeneous T2 signal out, complicating torsion cannot be excluded. 3. Moderate abdominopelvic ascites, without specific evidence of peritoneal metastasis. Consider further evaluation with contrast-enhanced abdominopelvic CT.    12/26/2016 Pathology Results   1. Uterus, ovaries and fallopian tubes - ADENOCARCINOMA INVOLVING RIGHT FALLOPIAN TUBE, RIGHT AND LEFT OVARIES, UTERINE SEROSA AND PELVIC MASS. - SEE ONCOLOGY TABLE AND COMMENT. - UTERINE CERVIX, ENDOMETRIUM, MYOMETRIUM AND LEFT FALLOPIAN TUBE FREE OF TUMOR. 2. Omentum, resection for tumor - ADENOCARCINOMA. Microscopic Comment 1. ONCOLOGY TABLE - FALLOPIAN TUBE. SEE COMMENT 1. Specimen, including laterality: Uterus, bilateral adnexa, pelvic mass and omentum. 2. Procedure: Hysterectomy with bilateral salpingo-oophorectomy, pelvic mass excision and omentectomy. 3. Lymph node sampling performed: No 4. Tumor site: See comment. 5. Tumor location in fallopian tube: Right fallopian tube fimbria 6. Specimen integrity (intact/ruptured/disrupted): Intact 7. Tumor size (cm): 18 cm, see comment. 8. Histologic type: Adenocarcinoma 9. Grade: High grade 10. Microscopic tumor extension: Tumor involves right fallopian tube, right and left ovaries, uterine serosa, pelvic mass and omentum. 11. Margins: See comment. 12.  Lymph-Vascular invasion: Present 13. Lymph nodes: #  examined: 0; # positive: N/A 14. TNM: pT3c, pNX 15. FIGO Stage (based on pathologic findings, needs clinical correlation: III-C 16. Comments: There is an 18 cm pelvic mass which is a high grade adenocarcinoma and there is a 12.2 cm  segment of omentum which is extensively involved with adenocarcinoma. The tumor also involves the fimbria of the right fallopian tube and the parenchyma of the right and left ovaries as well as uterine serosa. The fimbria of the right fallopian has intraepithelial atypia consistent with a precursor lesion and therefore this is most consistent with primary fallopian tube adenocarcinoma. A primary peritoneal serous carcinoma is a less likely possibility.    12/26/2016 Pathology Results   PERITONEAL/ASCITIC FLUID(SPECIMEN 1 OF 1 COLLECTED 12/26/16): POORLY DIFFERENTIATED ADENOCARCINOMA   12/26/2016 Surgery   Procedure(s) Performed: Exploratory laparotomy with total abdominal hysterectomy, bilateral salpingo-oophorectomy, omentectomy radical tumor debulking for ovarian cancer .  Surgeon: Thereasa Solo, MD.   Operative Findings: 20cm left ovarian mass densely adherent to the posterior uterus, right tube and ovary, cervix, sigmoid colon. 10cm omental cake. 4L ascites. 365m size tumor nodules on the serosa of the terminal ileum and proximal sigmoid colon. 1265mnodules on right diaphragm.    This represented an optimal cytoreduction (R1) with 65m43modules on intestine and diaphragm representing gross visible disease   01/16/2017 Procedure   Placement of single lumen port a cath via right internal jugular vein. The catheter tip lies at the cavo-atrial junction. A power injectable port a cath was placed and is ready for immediate use.   01/17/2017 Imaging   1. 3.5 x 7.2 x 4.6 cm fluid collection along the vaginal cuff, posterior the bladder and extending into the right adnexal space. This lesion demonstrates rim enhancement. Imaging  features could be related to a loculated postoperative seroma or hematoma. Superinfection cannot be excluded by CT. Given debulking surgery was 3 weeks ago, this entire structure is un likely to represent neoplasm, but peritoneal involvement could have this appearance. 2. Small fluid collection in the left para colic gutter without rim enhancement. 3. Irregular/nodular appearance of the peritoneal M in the anatomic pelvis with areas of subtle nodularity between the stomach and the spleen. Close attention in these regions on follow-up recommended as metastatic disease is a concern. 4. Mildly enlarged hepatoduodenal ligament lymph node. Attention on follow-up recommended.   01/20/2017 Tumor Marker   Patient's tumor was tested for the following markers: CA-125 Results of the tumor marker test revealed 46.6   01/28/2017 Genetic Testing       Negative genetic testing on the [facility].  The [facility] panel offered by [employer] sequencing and deletion/duplication testing of the following 28 genes: APC, ATM, BARD1, BMPR1A, BRCA1, BRCA2, BRIP1, CHD1, CDK4, CDKN2A, CHEK2, EPCAM (large rearrangement only), MLH1, MSH2, MSH6, MUTYH, NBN, PALB2, PMS2, PTEN, RAD51C, RAD51D, SMAD4, STK11, and TP53. Sequencing was performed for select regions of POLE and POLD1, and large rearrangement analysis was performed for select regions of GREM1. The report date is January 28, 2017.  HRD testing looking for genomic instability and BRCA mutations was negative.  The report date of this test is January 27, 2017.    01/31/2017 Tumor Marker   Patient's tumor was tested for the following markers: CA-125 Results of the tumor marker test revealed 22.4   03/04/2017 Tumor Marker   Patient's tumor was tested for the following markers: CA-125 Results of the tumor marker test revealed 13.8   04/18/2017 Tumor Marker  Patient's tumor was tested for the following markers: CA-125 Results of the  tumor marker test revealed 11.9   05/05/2017 Tumor Marker   Patient's tumor was tested for the following markers: CA-125 Results of the tumor marker test revealed 12   05/26/2017 Tumor Marker   Patient's tumor was tested for the following markers: CA-125 Results of the tumor marker test revealed 10.6   05/26/2017 Imaging   1. A previously noted postoperative fluid collection in the low pelvis and residual ascites seen on the prior examination has resolved on today's study. No findings to suggest residual/recurrent disease on today's examination. No definite solid organ metastasis identified in the abdomen or pelvis. No lymphadenopathy. 2. Aortic atherosclerosis.   06/10/2017 Procedure   Successful right IJ vein Port-A-Cath explant.   03/09/2018 Tumor Marker   Patient's tumor was tested for the following markers: CA-125 Results of the tumor marker test revealed 12.1   05/26/2018 Tumor Marker   Patient's tumor was tested for the following markers: CA-125 Results of the tumor marker test revealed 13.8   09/09/2018 Tumor Marker   Patient's tumor was tested for the following markers: CA-125 Results of the tumor marker test revealed 23.9   12/18/2018 Tumor Marker   Patient's tumor was tested for the following markers: CA-125 Results of the tumor marker test revealed 43.8   02/27/2019 - 02/28/2019 Hospital Admission   She was admitted for bowel obstruction   02/27/2019 Imaging   1. High-grade small bowel obstruction with transition point in the pelvis in an area of suspected desmoplastic reaction surrounding a 1.3 cm spiculated nodule in the mesentery, suspicious for carcinoid. There is hyperenhancement near the tip of the appendix and loss of the normal fat plane between it and the adjacent sigmoid colon, potentially the site of primary lesion. 2. Multiple new small subcentimeter hyperdense lesions scattered along the liver capsule, concerning for metastases. Enlarged hyperenhancing gastrohepatic  and portacaval lymph nodes concerning for nodal metastases. 3. Very mild right hydroureteronephrosis to the level of the desmoplastic reaction in the pelvis, potentially involving the right ureter. 4. Infraumbilical ventral abdominal wall diastasis containing a small nondilated portion of transverse colon. 5. Trace ascites. 6. Cholelithiasis. 7.  Aortic atherosclerosis (ICD10-I70.0).     03/05/2019 Tumor Marker   Patient's tumor was tested for the following markers: CA-125 Results of the tumor marker test revealed 70.1   03/12/2019 Echocardiogram   IMPRESSIONS     1. Left ventricular ejection fraction, by visual estimation, is 60 to 65%. The left ventricle has normal function. There is no left ventricular hypertrophy.  2. Left ventricular diastolic parameters are consistent with Grade I diastolic dysfunction (impaired relaxation).  3. Global right ventricle has normal systolic function.The right ventricular size is normal. No increase in right ventricular wall thickness.  4. Left atrial size was normal.  5. Right atrial size was normal.  6. The pericardium was not assessed.  7. The mitral valve is normal in structure. No evidence of mitral valve regurgitation.  8. The tricuspid valve is normal in structure. Tricuspid valve regurgitation is trivial.  9. The aortic valve is normal in structure. Aortic valve regurgitation is not visualized. 10. The pulmonic valve was grossly normal. Pulmonic valve regurgitation is not visualized. 11. The average left ventricular global longitudinal strain is -17.6 %.   03/16/2019 Procedure   Successful placement of a right internal jugular approach power injectable Port-A-Cath. The catheter is ready for immediate use.     04/19/2019 Tumor Marker   Patient's  tumor was tested for the following markers: CA-125 Results of the tumor marker test revealed 39.8   05/17/2019 Tumor Marker   Patient's tumor was tested for the following markers: CA-125 Results  of the tumor marker test revealed 27   05/28/2019 Tumor Marker   Patient's tumor was tested for the following markers: CA-125 Results of the tumor marker test revealed 27.7.   06/11/2019 Imaging   1. No new or progressive metastatic disease in the abdomen or pelvis. 2. Tiny noncalcified perisplenic implant is decreased. Calcified pericardiophrenic, retroperitoneal and bilateral inguinal lymph nodes and scattered small calcified perihepatic and perisplenic implants are stable. 3.  Aortic Atherosclerosis (ICD10-I70.0).       06/17/2019 Echocardiogram    1. Left ventricular ejection fraction, by estimation, is 65 to 70%. The left ventricle has normal function. The left ventricle has no regional wall motion abnormalities. Left ventricular diastolic parameters were normal.  2. Right ventricular systolic function is normal. The right ventricular size is normal.  3. The mitral valve is normal in structure and function. No evidence of mitral valve regurgitation. No evidence of mitral stenosis.  4. The aortic valve is normal in structure and function. Aortic valve regurgitation is not visualized. No aortic stenosis is present.   06/21/2019 Tumor Marker   Patient's tumor was tested for the following markers: CA-125 Results of the tumor marker test revealed 18.7   07/19/2019 Tumor Marker   Patient's tumor was tested for the following markers: CA-125 Results of the tumor marker test revealed 16.7   08/30/2019 Tumor Marker   Patient's tumor was tested for the following markers: CA-125. Results of the tumor marker test revealed 13.9   09/15/2019 Echocardiogram    1. Left ventricular ejection fraction, by estimation, is 65 to 70%. The left ventricle has normal function. The left ventricle has no regional wall motion abnormalities. Left ventricular diastolic parameters are consistent with Grade I diastolic dysfunction (impaired relaxation). The average left ventricular global longitudinal strain is 18.1 %.  The global longitudinal strain is normal.  2. Right ventricular systolic function is normal. The right ventricular size is normal.  3. The mitral valve is normal in structure. No evidence of mitral valve regurgitation. No evidence of mitral stenosis.  4. The aortic valve is normal in structure. Aortic valve regurgitation is not visualized. No aortic stenosis is present.  5. The inferior vena cava is normal in size with greater than 50% respiratory variability, suggesting right atrial pressure of 3 mmHg.     09/24/2019 Imaging   1. Stable exam. No new or progressive metastatic disease on today's study. 2. Small subcapsular hypodensity in the lateral spleen, new in the interval, but indeterminate. Attention on follow-up recommended. 3. The calcified upper abdominal and groin lymph nodes are stable as are the scattered calcified and noncalcified peritoneal implants. 4. Stable midline ventral hernia containing a short segment of colon without complicating features. 5. Aortic Atherosclerosis (ICD10-I70.0).     10/04/2019 -  Chemotherapy   The patient had bevacizumab-bvzr (ZIRABEV) 1,200 mg in sodium chloride 0.9 % 100 mL chemo infusion, 15 mg/kg = 1,200 mg, Intravenous,  Once, 2 of 4 cycles Administration: 1,200 mg (10/04/2019)  for chemotherapy treatment.    10/04/2019 Tumor Marker   Patient's tumor was tested for the following markers: CA-125. Results of the tumor marker test revealed 12.9.     REVIEW OF SYSTEMS:   Constitutional: Denies fevers, chills or abnormal weight loss Eyes: Denies blurriness of vision Ears, nose, mouth, throat, and  face: Denies mucositis or sore throat Respiratory: Denies cough, dyspnea or wheezes Cardiovascular: Denies palpitation, chest discomfort or lower extremity swelling Gastrointestinal:  Denies nausea, heartburn or change in bowel habits Skin: Denies abnormal skin rashes Lymphatics: Denies new lymphadenopathy or easy bruising Neurological:Denies numbness,  tingling or new weaknesses Behavioral/Psych: Mood is stable, no new changes  All other systems were reviewed with the patient and are negative.  I have reviewed the past medical history, past surgical history, social history and family history with the patient and they are unchanged from previous note.  ALLERGIES:  is allergic to dilaudid [hydromorphone hcl], morphine and related, and sudafed [pseudoephedrine hcl].  MEDICATIONS:  Current Outpatient Medications  Medication Sig Dispense Refill  . acetaminophen (TYLENOL) 325 MG tablet Take 975 mg by mouth every 6 (six) hours as needed (pain).    Marland Kitchen amLODipine (NORVASC) 10 MG tablet Take 1 tablet (10 mg total) by mouth daily. 30 tablet 9  . amoxicillin (AMOXIL) 500 MG capsule Take 1 capsule (500 mg total) by mouth 2 (two) times daily. 10 capsule 0  . HYDROcodone-acetaminophen (NORCO/VICODIN) 5-325 MG tablet Take 1 tablet by mouth every 6 (six) hours as needed.    . lidocaine-prilocaine (EMLA) cream APP EXT AA 1 TIME    . magic mouthwash w/lidocaine SOLN Take 5 mLs by mouth 4 (four) times daily. Take 5 ml's 4 x daily for 7 days. Swish and spit. 140 mL 0  . methylPREDNISolone (MEDROL DOSEPAK) 4 MG TBPK tablet Take 4 mg by mouth See admin instructions. follow package directions    . ondansetron (ZOFRAN) 8 MG tablet Take 1 tablet (8 mg total) by mouth every 8 (eight) hours as needed. 30 tablet 1  . prochlorperazine (COMPAZINE) 10 MG tablet TAKE 1 TABLET BY MOUTH EVERY 6 HOURS AS NEEDED FOR NAUSEA AND/OR VOMITING 30 tablet 1  . promethazine (PHENERGAN) 25 MG suppository Place 1 suppository (25 mg total) rectally 2 (two) times daily as needed for nausea or vomiting. 12 each 0  . traMADol (ULTRAM) 50 MG tablet Take 50 mg by mouth 3 (three) times daily as needed.    . venlafaxine XR (EFFEXOR-XR) 37.5 MG 24 hr capsule TAKE 1 CAPSULE BY MOUTH DAILY WITH BREAKFAST (Patient taking differently: Take 37.5 mg by mouth daily with breakfast. ) 90 capsule 6   No  current facility-administered medications for this visit.   Facility-Administered Medications Ordered in Other Visits  Medication Dose Route Frequency Provider Last Rate Last Admin  . bevacizumab-bvzr (ZIRABEV) 1,200 mg in sodium chloride 0.9 % 100 mL chemo infusion  15 mg/kg (Treatment Plan Recorded) Intravenous Once Bertis Ruddy, Kailyn Dubie, MD      . heparin lock flush 100 unit/mL  500 Units Intracatheter Once PRN Bertis Ruddy, Deeana Atwater, MD      . sodium chloride flush (NS) 0.9 % injection 10 mL  10 mL Intravenous PRN Bertis Ruddy, Delois Silvester, MD   10 mL at 02/11/17 0839  . sodium chloride flush (NS) 0.9 % injection 10 mL  10 mL Intravenous PRN Bertis Ruddy, Brytni Dray, MD   10 mL at 03/04/17 1510  . sodium chloride flush (NS) 0.9 % injection 10 mL  10 mL Intracatheter PRN Bertis Ruddy, Dorethy Tomey, MD        PHYSICAL EXAMINATION: ECOG PERFORMANCE STATUS: 0 - Asymptomatic  Vitals:   11/01/19 1131  BP: 139/63  Pulse: 77  Resp: 18  Temp: 97.8 F (36.6 C)  SpO2: 99%   Filed Weights   11/01/19 1131  Weight: 165 lb 12.8 oz (75.2 kg)  GENERAL:alert, no distress and comfortable SKIN: skin color, texture, turgor are normal, no rashes or significant lesions EYES: normal, Conjunctiva are pink and non-injected, sclera clear OROPHARYNX:no exudate, no erythema and lips, buccal mucosa, and tongue normal  NECK: supple, thyroid normal size, non-tender, without nodularity LYMPH:  no palpable lymphadenopathy in the cervical, axillary or inguinal LUNGS: clear to auscultation and percussion with normal breathing effort HEART: regular rate & rhythm and no murmurs and no lower extremity edema ABDOMEN:abdomen soft, non-tender and normal bowel sounds Musculoskeletal:no cyanosis of digits and no clubbing  NEURO: alert & oriented x 3 with fluent speech, no focal motor/sensory deficits  LABORATORY DATA:  I have reviewed the data as listed    Component Value Date/Time   NA 140 11/01/2019 1118   NA 139 04/14/2017 0742   K 4.2 11/01/2019 1118   K 4.0  04/14/2017 0742   CL 107 11/01/2019 1118   CO2 24 11/01/2019 1118   CO2 21 (L) 04/14/2017 0742   GLUCOSE 137 (H) 11/01/2019 1118   GLUCOSE 247 (H) 04/14/2017 0742   BUN 14 11/01/2019 1118   BUN 13.1 04/14/2017 0742   CREATININE 0.98 11/01/2019 1118   CREATININE 0.87 07/19/2019 0951   CREATININE 0.9 04/14/2017 0742   CALCIUM 9.4 11/01/2019 1118   CALCIUM 9.2 04/14/2017 0742   PROT 7.2 11/01/2019 1118   PROT 7.3 04/14/2017 0742   ALBUMIN 3.8 11/01/2019 1118   ALBUMIN 3.9 04/14/2017 0742   AST 15 11/01/2019 1118   AST 21 07/19/2019 0951   AST 17 04/14/2017 0742   ALT 12 11/01/2019 1118   ALT 13 07/19/2019 0951   ALT 14 04/14/2017 0742   ALKPHOS 85 11/01/2019 1118   ALKPHOS 70 04/14/2017 0742   BILITOT 0.6 11/01/2019 1118   BILITOT 0.3 07/19/2019 0951   BILITOT 0.37 04/14/2017 0742   GFRNONAA >60 11/01/2019 1118   GFRNONAA >60 07/19/2019 0951   GFRAA >60 11/01/2019 1118   GFRAA >60 07/19/2019 0951    No results found for: SPEP, UPEP  Lab Results  Component Value Date   WBC 4.7 11/01/2019   NEUTROABS 2.5 11/01/2019   HGB 11.9 (L) 11/01/2019   HCT 36.3 11/01/2019   MCV 98.1 11/01/2019   PLT 181 11/01/2019      Chemistry      Component Value Date/Time   NA 140 11/01/2019 1118   NA 139 04/14/2017 0742   K 4.2 11/01/2019 1118   K 4.0 04/14/2017 0742   CL 107 11/01/2019 1118   CO2 24 11/01/2019 1118   CO2 21 (L) 04/14/2017 0742   BUN 14 11/01/2019 1118   BUN 13.1 04/14/2017 0742   CREATININE 0.98 11/01/2019 1118   CREATININE 0.87 07/19/2019 0951   CREATININE 0.9 04/14/2017 0742      Component Value Date/Time   CALCIUM 9.4 11/01/2019 1118   CALCIUM 9.2 04/14/2017 0742   ALKPHOS 85 11/01/2019 1118   ALKPHOS 70 04/14/2017 0742   AST 15 11/01/2019 1118   AST 21 07/19/2019 0951   AST 17 04/14/2017 0742   ALT 12 11/01/2019 1118   ALT 13 07/19/2019 0951   ALT 14 04/14/2017 0742   BILITOT 0.6 11/01/2019 1118   BILITOT 0.3 07/19/2019 0951   BILITOT 0.37  04/14/2017 0742

## 2019-11-01 NOTE — Patient Instructions (Signed)
[facility] Discharge Instructions for Patients Receiving Chemotherapy  Today you received the following chemotherapy agents:  bevacizumab  To help prevent nausea and vomiting after your treatment, we encourage you to take your nausea medication as prescribed.   If you develop nausea and vomiting that is not controlled by your nausea medication, call the clinic.   BELOW ARE SYMPTOMS THAT SHOULD BE REPORTED IMMEDIATELY:  *FEVER GREATER THAN 100.5 F  *CHILLS WITH OR WITHOUT FEVER  NAUSEA AND VOMITING THAT IS NOT CONTROLLED WITH YOUR NAUSEA MEDICATION  *UNUSUAL SHORTNESS OF BREATH  *UNUSUAL BRUISING OR BLEEDING  TENDERNESS IN MOUTH AND THROAT WITH OR WITHOUT PRESENCE OF ULCERS  *URINARY PROBLEMS  *BOWEL PROBLEMS  UNUSUAL RASH Items with * indicate a potential emergency and should be followed up as soon as possible.  Feel free to call the clinic should you have any questions or concerns. The clinic phone number is [phone].  Please show the CHEMO ALERT CARD at check-in to the Emergency Department and triage nurse.   

## 2019-11-01 NOTE — Assessment & Plan Note (Signed)
Her blood pressure is stable Observe only for now 

## 2019-11-02 ENCOUNTER — Telehealth: Payer: Self-pay

## 2019-11-02 LAB — CA 125: Cancer Antigen (CA) 125: 15.4 U/mL (ref 0.0–38.1)

## 2019-11-02 NOTE — Telephone Encounter (Signed)
-----   Message from Heath Lark, MD sent at 11/02/2019  9:05 AM EDT ----- Regarding: pls let her know CA-125 is ok

## 2019-11-02 NOTE — Telephone Encounter (Signed)
TC to Pt. Per Dr. Alvy Bimler to inform her of CA-125 is ok. Pt verbalized understanding, No further problems or concerns noted.

## 2019-11-22 ENCOUNTER — Inpatient Hospital Stay (HOSPITAL_BASED_OUTPATIENT_CLINIC_OR_DEPARTMENT_OTHER): Payer: BC Managed Care – PPO | Admitting: Hematology and Oncology

## 2019-11-22 ENCOUNTER — Encounter: Payer: Self-pay | Admitting: Hematology and Oncology

## 2019-11-22 ENCOUNTER — Inpatient Hospital Stay: Payer: BC Managed Care – PPO | Attending: Hematology and Oncology

## 2019-11-22 ENCOUNTER — Inpatient Hospital Stay: Payer: BC Managed Care – PPO

## 2019-11-22 ENCOUNTER — Other Ambulatory Visit: Payer: Self-pay

## 2019-11-22 DIAGNOSIS — C5701 Malignant neoplasm of right fallopian tube: Secondary | ICD-10-CM

## 2019-11-22 DIAGNOSIS — R600 Localized edema: Secondary | ICD-10-CM

## 2019-11-22 DIAGNOSIS — C562 Malignant neoplasm of left ovary: Secondary | ICD-10-CM

## 2019-11-22 DIAGNOSIS — Z5112 Encounter for antineoplastic immunotherapy: Secondary | ICD-10-CM | POA: Insufficient documentation

## 2019-11-22 DIAGNOSIS — Z7189 Other specified counseling: Secondary | ICD-10-CM

## 2019-11-22 DIAGNOSIS — Z79899 Other long term (current) drug therapy: Secondary | ICD-10-CM | POA: Diagnosis not present

## 2019-11-22 DIAGNOSIS — I10 Essential (primary) hypertension: Secondary | ICD-10-CM | POA: Diagnosis not present

## 2019-11-22 DIAGNOSIS — C569 Malignant neoplasm of unspecified ovary: Secondary | ICD-10-CM

## 2019-11-22 LAB — CBC WITH DIFFERENTIAL (CANCER CENTER ONLY)
Abs Immature Granulocytes: 0.03 10*3/uL (ref 0.00–0.07)
Basophils Absolute: 0 10*3/uL (ref 0.0–0.1)
Basophils Relative: 1 %
Eosinophils Absolute: 0.2 10*3/uL (ref 0.0–0.5)
Eosinophils Relative: 5 %
HCT: 35 % — ABNORMAL LOW (ref 36.0–46.0)
Hemoglobin: 11.9 g/dL — ABNORMAL LOW (ref 12.0–15.0)
Immature Granulocytes: 1 %
Lymphocytes Relative: 31 %
Lymphs Abs: 1.5 10*3/uL (ref 0.7–4.0)
MCH: 32.2 pg (ref 26.0–34.0)
MCHC: 34 g/dL (ref 30.0–36.0)
MCV: 94.6 fL (ref 80.0–100.0)
Monocytes Absolute: 0.4 10*3/uL (ref 0.1–1.0)
Monocytes Relative: 9 %
Neutro Abs: 2.6 10*3/uL (ref 1.7–7.7)
Neutrophils Relative %: 53 %
Platelet Count: 165 10*3/uL (ref 150–400)
RBC: 3.7 MIL/uL — ABNORMAL LOW (ref 3.87–5.11)
RDW: 12.8 % (ref 11.5–15.5)
WBC Count: 4.7 10*3/uL (ref 4.0–10.5)
nRBC: 0 % (ref 0.0–0.2)

## 2019-11-22 LAB — COMPREHENSIVE METABOLIC PANEL
ALT: 9 U/L (ref 0–44)
AST: 15 U/L (ref 15–41)
Albumin: 3.8 g/dL (ref 3.5–5.0)
Alkaline Phosphatase: 85 U/L (ref 38–126)
Anion gap: 10 (ref 5–15)
BUN: 18 mg/dL (ref 8–23)
CO2: 24 mmol/L (ref 22–32)
Calcium: 9.9 mg/dL (ref 8.9–10.3)
Chloride: 107 mmol/L (ref 98–111)
Creatinine, Ser: 0.99 mg/dL (ref 0.44–1.00)
GFR calc Af Amer: >60 mL/min (ref >60)
GFR calc non Af Amer: 60 mL/min — ABNORMAL LOW (ref >60)
Glucose, Bld: 113 mg/dL — ABNORMAL HIGH (ref 70–99)
Potassium: 4.1 mmol/L (ref 3.5–5.1)
Sodium: 141 mmol/L (ref 135–145)
Total Bilirubin: 0.6 mg/dL (ref 0.3–1.2)
Total Protein: 7 g/dL (ref 6.5–8.1)

## 2019-11-22 LAB — TOTAL PROTEIN, URINE DIPSTICK: Protein, ur: 30 mg/dL — AB

## 2019-11-22 MED ORDER — SODIUM CHLORIDE 0.9 % IV SOLN
15.0000 mg/kg | Freq: Once | INTRAVENOUS | Status: AC
  Administered 2019-11-22: 1200 mg via INTRAVENOUS
  Filled 2019-11-22: qty 48

## 2019-11-22 MED ORDER — SODIUM CHLORIDE 0.9% FLUSH
10.0000 mL | INTRAVENOUS | Status: DC | PRN
  Administered 2019-11-22: 10 mL
  Filled 2019-11-22: qty 10

## 2019-11-22 MED ORDER — HEPARIN SOD (PORK) LOCK FLUSH 100 UNIT/ML IV SOLN
500.0000 [IU] | Freq: Once | INTRAVENOUS | Status: AC | PRN
  Administered 2019-11-22: 500 [IU]
  Filled 2019-11-22: qty 5

## 2019-11-22 MED ORDER — SODIUM CHLORIDE 0.9% FLUSH
10.0000 mL | Freq: Once | INTRAVENOUS | Status: AC
  Administered 2019-11-22: 10 mL
  Filled 2019-11-22: qty 10

## 2019-11-22 MED ORDER — SODIUM CHLORIDE 0.9 % IV SOLN
Freq: Once | INTRAVENOUS | Status: AC
  Filled 2019-11-22: qty 250

## 2019-11-22 NOTE — Assessment & Plan Note (Signed)
Her blood pressure is stable Observe only for now 

## 2019-11-22 NOTE — Assessment & Plan Note (Signed)
She has intermittent leg swelling likely secondary to side effects of amlodipine For now, I recommend reduce salt intake and monitor closely

## 2019-11-22 NOTE — Patient Instructions (Addendum)
[facility] Discharge Instructions for Patients Receiving Chemotherapy  Today you received the following chemotherapy agents: bevacizumab.  To help prevent nausea and vomiting after your treatment, we encourage you to take your nausea medication as directed.   If you develop nausea and vomiting that is not controlled by your nausea medication, call the clinic.   BELOW ARE SYMPTOMS THAT SHOULD BE REPORTED IMMEDIATELY:  *FEVER GREATER THAN 100.5 F  *CHILLS WITH OR WITHOUT FEVER  NAUSEA AND VOMITING THAT IS NOT CONTROLLED WITH YOUR NAUSEA MEDICATION  *UNUSUAL SHORTNESS OF BREATH  *UNUSUAL BRUISING OR BLEEDING  TENDERNESS IN MOUTH AND THROAT WITH OR WITHOUT PRESENCE OF ULCERS  *URINARY PROBLEMS  *BOWEL PROBLEMS  UNUSUAL RASH Items with * indicate a potential emergency and should be followed up as soon as possible.  Feel free to call the clinic should you have any questions or concerns. The clinic phone number is [phone].  Please show the [facility] at check-in to the Emergency Department and triage nurse.  Proteinuria Proteinuria is when there is too much protein in the urine. Proteins are important for building muscles and bones. Proteins are also needed to fight infections, help the blood to clot, and keep body fluids in balance. Proteinuria may be mild and temporary, or it may be an early sign of kidney disease. The kidneys make urine. Healthy kidneys also keep substances like proteins from leaving the blood and ending up in the urine. What are the causes? This condition may be caused by damage to the kidneys or by temporary causes such as fever or stress. Proteinuria may happen when the kidneys are not working well. Healthy kidneys have filters (glomeruli) that keep proteins out of the urine. Proteinuria may mean that the glomeruli are damaged. The main causes of this type of damage are:  Diabetes.  High blood pressure. Other causes of kidney  damage can also cause proteinuria, such as:  Diseases of the immune system, such as lupus, rheumatoid arthritis, sarcoidosis, and Goodpasture syndrome.  Heart disease or heart failure.  Kidney infection.  Certain cancers, including kidney cancer, lymphoma, leukemia, and multiple myeloma.  Amyloidosis. This is a disease that causes abnormal proteins to build up in body tissues.  Reactions to certain medicines, such as NSAIDs.  Injuries or poisons (toxins).  High blood pressure that occurs during pregnancy (preeclampsia and eclampsia). Temporary proteinuria may result from conditions that put stress on the kidneys. These conditions usually do not cause kidney damage. They include:  Fever.  Exposure to cold or heat.  Emotional or physical stress.  Extreme exercise.  Standing for long periods of time. What increases the risk? You are more likely to develop this condition if you:  Have diabetes.  Have high blood pressure.  Have heart disease or heart failure.  Have an immune disease, cancer, or other disease that affects the kidneys.  Have a family history of kidney disease.  Are 66 years of age or older.  Are overweight.  Are of African American, American [place], Hispanic/Latino, or [place] descent.  Are pregnant.  Have an infection. What are the signs or symptoms? Mild proteinuria may not cause symptoms. As more proteins enter the urine, symptoms of kidney disease may develop, such as:  Foamy urine.  Swelling of the face, abdomen, hands, legs, or feet (edema).  Needing to urinate frequently.  Fatigue.  Difficulty sleeping.  Dry and itchy skin.  Nausea and vomiting.  Muscle cramps.  Shortness of breath. How is  this diagnosed? This condition may be diagnosed with a urine test. You may have this test as part of a routine physical exam or because you have symptoms of kidney disease or risk factors for kidney disease. You may also have:  Blood  tests to measure the level of a certain substance (creatinine) that increases with kidney disease.  Imaging tests of your kidney, such as a CT scan or an ultrasound, to look for signs of kidney damage. How is this treated? If your proteinuria is mild or temporary, treatment may not be needed for this condition. Your health care provider may show you how to monitor the level of protein in your urine at home. Identifying proteinuria early is important so that the cause of the condition can be treated. Treatment for this condition depends on the cause of your proteinuria. Treatment may include:  Making diet and lifestyle changes.  Getting blood pressure under control.  Getting blood sugar under control, if you have diabetes.  Managing any other medical conditions you have that affect your kidneys.  Giving birth, if you are pregnant.  Avoiding medicines that damage your kidneys. In severe cases, kidney disease may need to be treated with medicines or dialysis. Follow these instructions at home: Activity  Return to your normal activities as told by your health care provider. Ask your health care provider what activities are safe for you.  Ask your health care provider to recommend an exercise program. General instructions  Check your protein levels at home if directed by your health care provider.  Follow instructions from your health care provider about eating or drinking restrictions.  If you are overweight, ask your health care provider about diets that can help you get to a healthy weight.  Take over-the-counter and prescription medicines only as told by your health care provider.  Keep all follow-up visits as told by your health care provider. This is important. Contact a health care provider if:  You have new symptoms.  Your symptoms get worse or do not improve. Get help right away if you:  Have back pain.  Have diarrhea.  Vomit.  Have a fever.  Have a  rash. Summary  Proteinuria is when there is too much protein in the urine.  Proteinuria may be mild and temporary, or it may be an early sign of kidney disease.  This condition may be diagnosed with a urine test.  Treatment for this condition depends on the cause of your proteinuria.  Treatment may include diet and lifestyle changes, blood pressure and blood sugar management, and avoiding medicines that may damage the kidneys. If the proteinuria is severe, it may need to be treated with medicines or dialysis. This information is not intended to replace advice given to you by your health care provider. Make sure you discuss any questions you have with your health care provider. Document Revised: 11/24/2017 Document Reviewed: 11/24/2017 [employer] Patient Education  [employer].

## 2019-11-22 NOTE — Patient Instructions (Signed)

## 2019-11-22 NOTE — Assessment & Plan Note (Signed)
She has achieved complete response to therapy We discussed the role of maintenance treatment So far, she tolerated bevacizumab well Her blood pressure is satisfactory She will continue treatment every 3 weeks as scheduled, we will plan to repeat imaging study next month

## 2019-11-22 NOTE — Progress Notes (Signed)
[facility] OFFICE PROGRESS NOTE  Patient Care Team: Gaynelle Arabian, MD as PCP - General (Family Medicine) Heath Lark, MD as Consulting Physician (Hematology and Oncology) Everitt Amber, MD as Consulting Physician (Obstetrics and Gynecology)  ASSESSMENT & PLAN:  Adenocarcinoma of right fallopian tube [facility]) She has achieved complete response to therapy We discussed the role of maintenance treatment So far, she tolerated bevacizumab well Her blood pressure is satisfactory She will continue treatment every 3 weeks as scheduled, we will plan to repeat imaging study next month  Essential hypertension Her blood pressure is stable Observe only for now  Bilateral leg edema She has intermittent leg swelling likely secondary to side effects of amlodipine For now, I recommend reduce salt intake and monitor closely   No orders of the defined types were placed in this encounter.   All questions were answered. The patient knows to call the clinic with any problems, questions or concerns. The total time spent in the appointment was 20 minutes encounter with patients including review of chart and various tests results, discussions about plan of care and coordination of care plan   Heath Lark, MD 11/22/2019 5:53 PM  INTERVAL HISTORY: Please see below for problem oriented charting. She returns for further follow-up Since last time I saw her, she tolerated treatment well except for intermittent leg swelling Her initial blood pressure was high but it is improved No other new side effects from treatment  SUMMARY OF ONCOLOGIC HISTORY: Oncology History Overview Note  Neg genetics   Adenocarcinoma of right fallopian tube ([place])  08/12/2016 Initial Diagnosis   The patient has many months of vague symptoms of fullness in the pelvis, urinary frequency and difficulty with defecation. She denies vaginal bleeding or rectal bleeding.    08/12/2016 Imaging   Abdomen X-ray in the  ER: Moderate colonic stool burden without evidence of enteric obstruction   11/13/2016 Imaging   TVUS was performed on 11/13/16 which showed a large hypoechoic lobular mass seen posterior to the uterus measuring 18.2x16.1x11.8cm, blood flow was seen along the periphery of the mass. It was considered to be likely to be a fibroid vs pelvic mass vs ovarian mass. Neither ovary was removed. Moderate amount of free fluid in the pelvis. THe uterus measures 4x10x4cm. The endometrium is 2m.    12/05/2016 Imaging   MR pelvis 1. Mild limitations as detailed above. 2. Heterogeneous pelvic mass is favored to arise from the right ovary. Favor a solid ovarian neoplasm such as fibroma/Brenner's tumor. Given lesion size and heterogeneous T2 signal out, complicating torsion cannot be excluded. 3. Moderate abdominopelvic ascites, without specific evidence of peritoneal metastasis. Consider further evaluation with contrast-enhanced abdominopelvic CT.    12/26/2016 Pathology Results   1. Uterus, ovaries and fallopian tubes - ADENOCARCINOMA INVOLVING RIGHT FALLOPIAN TUBE, RIGHT AND LEFT OVARIES, UTERINE SEROSA AND PELVIC MASS. - SEE ONCOLOGY TABLE AND COMMENT. - UTERINE CERVIX, ENDOMETRIUM, MYOMETRIUM AND LEFT FALLOPIAN TUBE FREE OF TUMOR. 2. Omentum, resection for tumor - ADENOCARCINOMA. Microscopic Comment 1. ONCOLOGY TABLE - FALLOPIAN TUBE. SEE COMMENT 1. Specimen, including laterality: Uterus, bilateral adnexa, pelvic mass and omentum. 2. Procedure: Hysterectomy with bilateral salpingo-oophorectomy, pelvic mass excision and omentectomy. 3. Lymph node sampling performed: No 4. Tumor site: See comment. 5. Tumor location in fallopian tube: Right fallopian tube fimbria 6. Specimen integrity (intact/ruptured/disrupted): Intact 7. Tumor size (cm): 18 cm, see comment. 8. Histologic type: Adenocarcinoma 9. Grade: High grade 10. Microscopic tumor extension: Tumor involves right fallopian tube, right and left  ovaries, uterine serosa, pelvic mass and omentum. 11. Margins: See comment. 12. Lymph-Vascular invasion: Present 13. Lymph nodes: # examined: 0; # positive: N/A 14. TNM: pT3c, pNX 15. FIGO Stage (based on pathologic findings, needs clinical correlation: III-C 16. Comments: There is an 18 cm pelvic mass which is a high grade adenocarcinoma and there is a 12.2 cm  segment of omentum which is extensively involved with adenocarcinoma. The tumor also involves the fimbria of the right fallopian tube and the parenchyma of the right and left ovaries as well as uterine serosa. The fimbria of the right fallopian has intraepithelial atypia consistent with a precursor lesion and therefore this is most consistent with primary fallopian tube adenocarcinoma. A primary peritoneal serous carcinoma is a less likely possibility.    12/26/2016 Pathology Results   PERITONEAL/ASCITIC FLUID(SPECIMEN 1 OF 1 COLLECTED 12/26/16): POORLY DIFFERENTIATED ADENOCARCINOMA   12/26/2016 Surgery   Procedure(s) Performed: Exploratory laparotomy with total abdominal hysterectomy, bilateral salpingo-oophorectomy, omentectomy radical tumor debulking for ovarian cancer .  Surgeon: Thereasa Solo, MD.   Operative Findings: 20cm left ovarian mass densely adherent to the posterior uterus, right tube and ovary, cervix, sigmoid colon. 10cm omental cake. 4L ascites. 40m size tumor nodules on the serosa of the terminal ileum and proximal sigmoid colon. 166mnodules on right diaphragm.    This represented an optimal cytoreduction (R1) with 60m40modules on intestine and diaphragm representing gross visible disease   01/16/2017 Procedure   Placement of single lumen port a cath via right internal jugular vein. The catheter tip lies at the cavo-atrial junction. A power injectable port a cath was placed and is ready for immediate use.   01/17/2017 Imaging   1. 3.5 x 7.2 x 4.6 cm fluid collection along the vaginal cuff, posterior the bladder and  extending into the right adnexal space. This lesion demonstrates rim enhancement. Imaging features could be related to a loculated postoperative seroma or hematoma. Superinfection cannot be excluded by CT. Given debulking surgery was 3 weeks ago, this entire structure is un likely to represent neoplasm, but peritoneal involvement could have this appearance. 2. Small fluid collection in the left para colic gutter without rim enhancement. 3. Irregular/nodular appearance of the peritoneal M in the anatomic pelvis with areas of subtle nodularity between the stomach and the spleen. Close attention in these regions on follow-up recommended as metastatic disease is a concern. 4. Mildly enlarged hepatoduodenal ligament lymph node. Attention on follow-up recommended.   01/20/2017 Tumor Marker   Patient's tumor was tested for the following markers: CA-125 Results of the tumor marker test revealed 46.6   01/28/2017 Genetic Testing       Negative genetic [facility] on [facility] [place] [facility]  The [facility] panel offered by [employer] sequencing and deletion/duplication testing of the following 28 genes: APC, ATM, BARD1, BMPR1A, BRCA1, BRCA2, BRIP1, CHD1, CDK4, CDKN2A, CHEK2, EPCAM (large rearrangement only), MLH1, MSH2, MSH6, MUTYH, NBN, PALB2, PMS2, PTEN, RAD51C, RAD51D, SMAD4, STK11, and TP53. Sequencing was performed for select regions of POLE and POLD1, and large rearrangement analysis was performed for select regions of GREM1. The report date is January 28, 2017.  HRD testing looking for genomic instability and BRCA mutations was negative.  The report date of this test is January 27, 2017.    01/31/2017 Tumor Marker   Patient's tumor was tested for the following markers: CA-125 Results of the tumor marker test revealed 22.4   03/04/2017 Tumor Marker   Patient's tumor was tested for  the following markers: CA-125 Results of the tumor marker test revealed 13.8   04/18/2017 Tumor  Marker   Patient's tumor was tested for the following markers: CA-125 Results of the tumor marker test revealed 11.9   05/05/2017 Tumor Marker   Patient's tumor was tested for the following markers: CA-125 Results of the tumor marker test revealed 12   05/26/2017 Tumor Marker   Patient's tumor was tested for the following markers: CA-125 Results of the tumor marker test revealed 10.6   05/26/2017 Imaging   1. A previously noted postoperative fluid collection in the low pelvis and residual ascites seen on the prior examination has resolved on today's study. No findings to suggest residual/recurrent disease on today's examination. No definite solid organ metastasis identified in the abdomen or pelvis. No lymphadenopathy. 2. Aortic atherosclerosis.   06/10/2017 Procedure   Successful right IJ vein Port-A-Cath explant.   03/09/2018 Tumor Marker   Patient's tumor was tested for the following markers: CA-125 Results of the tumor marker test revealed 12.1   05/26/2018 Tumor Marker   Patient's tumor was tested for the following markers: CA-125 Results of the tumor marker test revealed 13.8   09/09/2018 Tumor Marker   Patient's tumor was tested for the following markers: CA-125 Results of the tumor marker test revealed 23.9   12/18/2018 Tumor Marker   Patient's tumor was tested for the following markers: CA-125 Results of the tumor marker test revealed 43.8   02/27/2019 - 02/28/2019 Hospital Admission   She was admitted for bowel obstruction   02/27/2019 Imaging   1. High-grade small bowel obstruction with transition point in the pelvis in an area of suspected desmoplastic reaction surrounding a 1.3 cm spiculated nodule in the mesentery, suspicious for carcinoid. There is hyperenhancement near the tip of the appendix and loss of the normal fat plane between it and the adjacent sigmoid colon, potentially the site of primary lesion. 2. Multiple new small subcentimeter hyperdense lesions scattered  along the liver capsule, concerning for metastases. Enlarged hyperenhancing gastrohepatic and portacaval lymph nodes concerning for nodal metastases. 3. Very mild right hydroureteronephrosis to the level of the desmoplastic reaction in the pelvis, potentially involving the right ureter. 4. Infraumbilical ventral abdominal wall diastasis containing a small nondilated portion of transverse colon. 5. Trace ascites. 6. Cholelithiasis. 7.  Aortic atherosclerosis (ICD10-I70.0).     03/05/2019 Tumor Marker   Patient's tumor was tested for the following markers: CA-125 Results of the tumor marker test revealed 70.1   03/12/2019 Echocardiogram   IMPRESSIONS     1. Left ventricular ejection fraction, by visual estimation, is 60 to 65%. The left ventricle has normal function. There is no left ventricular hypertrophy.  2. Left ventricular diastolic parameters are consistent with Grade I diastolic dysfunction (impaired relaxation).  3. Global right ventricle has normal systolic function.The right ventricular size is normal. No increase in right ventricular wall thickness.  4. Left atrial size was normal.  5. Right atrial size was normal.  6. The pericardium was not assessed.  7. The mitral valve is normal in structure. No evidence of mitral valve regurgitation.  8. The tricuspid valve is normal in structure. Tricuspid valve regurgitation is trivial.  9. The aortic valve is normal in structure. Aortic valve regurgitation is not visualized. 10. The pulmonic valve was grossly normal. Pulmonic valve regurgitation is not visualized. 11. The average left ventricular global longitudinal strain is -17.6 %.   03/16/2019 Procedure   Successful placement of a right internal jugular approach power  injectable Port-A-Cath. The catheter is ready for immediate use.     04/19/2019 Tumor Marker   Patient's tumor was tested for the following markers: CA-125 Results of the tumor marker test revealed 39.8    05/17/2019 Tumor Marker   Patient's tumor was tested for the following markers: CA-125 Results of the tumor marker test revealed 27   05/28/2019 Tumor Marker   Patient's tumor was tested for the following markers: CA-125 Results of the tumor marker test revealed 27.7.   06/11/2019 Imaging   1. No new or progressive metastatic disease in the abdomen or pelvis. 2. Tiny noncalcified perisplenic implant is decreased. Calcified pericardiophrenic, retroperitoneal and bilateral inguinal lymph nodes and scattered small calcified perihepatic and perisplenic implants are stable. 3.  Aortic Atherosclerosis (ICD10-I70.0).       06/17/2019 Echocardiogram    1. Left ventricular ejection fraction, by estimation, is 65 to 70%. The left ventricle has normal function. The left ventricle has no regional wall motion abnormalities. Left ventricular diastolic parameters were normal.  2. Right ventricular systolic function is normal. The right ventricular size is normal.  3. The mitral valve is normal in structure and function. No evidence of mitral valve regurgitation. No evidence of mitral stenosis.  4. The aortic valve is normal in structure and function. Aortic valve regurgitation is not visualized. No aortic stenosis is present.   06/21/2019 Tumor Marker   Patient's tumor was tested for the following markers: CA-125 Results of the tumor marker test revealed 18.7   07/19/2019 Tumor Marker   Patient's tumor was tested for the following markers: CA-125 Results of the tumor marker test revealed 16.7   08/30/2019 Tumor Marker   Patient's tumor was tested for the following markers: CA-125. Results of the tumor marker test revealed 13.9   09/15/2019 Echocardiogram    1. Left ventricular ejection fraction, by estimation, is 65 to 70%. The left ventricle has normal function. The left ventricle has no regional wall motion abnormalities. Left ventricular diastolic parameters are consistent with Grade I diastolic  dysfunction (impaired relaxation). The average left ventricular global longitudinal strain is 18.1 %. The global longitudinal strain is normal.  2. Right ventricular systolic function is normal. The right ventricular size is normal.  3. The mitral valve is normal in structure. No evidence of mitral valve regurgitation. No evidence of mitral stenosis.  4. The aortic valve is normal in structure. Aortic valve regurgitation is not visualized. No aortic stenosis is present.  5. The inferior vena cava is normal in size with greater than 50% respiratory variability, suggesting right atrial pressure of 3 mmHg.     09/24/2019 Imaging   1. Stable exam. No new or progressive metastatic disease on today's study. 2. Small subcapsular hypodensity in the lateral spleen, new in the interval, but indeterminate. Attention on follow-up recommended. 3. The calcified upper abdominal and groin lymph nodes are stable as are the scattered calcified and noncalcified peritoneal implants. 4. Stable midline ventral hernia containing a short segment of colon without complicating features. 5. Aortic Atherosclerosis (ICD10-I70.0).     10/04/2019 -  Chemotherapy   The patient had bevacizumab-bvzr (ZIRABEV) 1,200 mg in sodium chloride 0.9 % 100 mL chemo infusion, 15 mg/kg = 1,200 mg, Intravenous,  Once, 3 of 4 cycles Administration: 1,200 mg (10/04/2019), 1,200 mg (11/22/2019), 1,200 mg (11/01/2019)  for chemotherapy treatment.    10/04/2019 Tumor Marker   Patient's tumor was tested for the following markers: CA-125. Results of the tumor marker test revealed 12.9.  11/01/2019 Tumor Marker   Patient's tumor was tested for the following markers: CA-125 Results of the tumor marker test revealed 15.4     REVIEW OF SYSTEMS:   Constitutional: Denies fevers, chills or abnormal weight loss Eyes: Denies blurriness of vision Ears, nose, mouth, throat, and face: Denies mucositis or sore throat Respiratory: Denies cough, dyspnea or  wheezes Cardiovascular: Denies palpitation, chest discomfort  Gastrointestinal:  Denies nausea, heartburn or change in bowel habits Skin: Denies abnormal skin rashes Lymphatics: Denies new lymphadenopathy or easy bruising Neurological:Denies numbness, tingling or new weaknesses Behavioral/Psych: Mood is stable, no new changes  All other systems were reviewed with the patient and are negative.  I have reviewed the past medical history, past surgical history, social history and family history with the patient and they are unchanged from previous note.  ALLERGIES:  is allergic to dilaudid [hydromorphone hcl], morphine and related, and sudafed [pseudoephedrine hcl].  MEDICATIONS:  Current Outpatient Medications  Medication Sig Dispense Refill  . acetaminophen (TYLENOL) 325 MG tablet Take 975 mg by mouth every 6 (six) hours as needed (pain).    Marland Kitchen amLODipine (NORVASC) 10 MG tablet Take 1 tablet (10 mg total) by mouth daily. 30 tablet 9  . amoxicillin (AMOXIL) 500 MG capsule Take 1 capsule (500 mg total) by mouth 2 (two) times daily. 10 capsule 0  . HYDROcodone-acetaminophen (NORCO/VICODIN) 5-325 MG tablet Take 1 tablet by mouth every 6 (six) hours as needed.    . lidocaine-prilocaine (EMLA) cream APP EXT AA 1 TIME    . magic mouthwash w/lidocaine SOLN Take 5 mLs by mouth 4 (four) times daily. Take 5 ml's 4 x daily for 7 days. Swish and spit. 140 mL 0  . methylPREDNISolone (MEDROL DOSEPAK) 4 MG TBPK tablet Take 4 mg by mouth See admin instructions. follow package directions    . ondansetron (ZOFRAN) 8 MG tablet Take 1 tablet (8 mg total) by mouth every 8 (eight) hours as needed. 30 tablet 1  . prochlorperazine (COMPAZINE) 10 MG tablet TAKE 1 TABLET BY MOUTH EVERY 6 HOURS AS NEEDED FOR NAUSEA AND/OR VOMITING 30 tablet 1  . promethazine (PHENERGAN) 25 MG suppository Place 1 suppository (25 mg total) rectally 2 (two) times daily as needed for nausea or vomiting. 12 each 0  . traMADol (ULTRAM) 50 MG  tablet Take 50 mg by mouth 3 (three) times daily as needed.    . venlafaxine XR (EFFEXOR-XR) 37.5 MG 24 hr capsule TAKE 1 CAPSULE BY MOUTH DAILY WITH BREAKFAST (Patient taking differently: Take 37.5 mg by mouth daily with breakfast. ) 90 capsule 6   No current facility-administered medications for this visit.   Facility-Administered Medications Ordered in Other Visits  Medication Dose Route Frequency Provider Last Rate Last Admin  . sodium chloride flush (NS) 0.9 % injection 10 mL  10 mL Intravenous PRN Alvy Bimler, Sulamita Lafountain, MD   10 mL at 02/11/17 0839  . sodium chloride flush (NS) 0.9 % injection 10 mL  10 mL Intravenous PRN Alvy Bimler, Zina Pitzer, MD   10 mL at 03/04/17 1510  . sodium chloride flush (NS) 0.9 % injection 10 mL  10 mL Intracatheter PRN Alvy Bimler, Glenna Brunkow, MD   10 mL at 11/22/19 1433    PHYSICAL EXAMINATION: ECOG PERFORMANCE STATUS: 1 - Symptomatic but completely ambulatory  Vitals:   11/22/19 1216  BP: 128/67  Pulse: 76  Resp: 18  Temp: 98 F (36.7 C)  SpO2: 100%   Filed Weights   11/22/19 1216  Weight: 167 lb 6.4 oz (  75.9 kg)    GENERAL:alert, no distress and comfortable SKIN: skin color, texture, turgor are normal, no rashes or significant lesions EYES: normal, Conjunctiva are pink and non-injected, sclera clear OROPHARYNX:no exudate, no erythema and lips, buccal mucosa, and tongue normal  NECK: supple, thyroid normal size, non-tender, without nodularity LYMPH:  no palpable lymphadenopathy in the cervical, axillary or inguinal LUNGS: clear to auscultation and percussion with normal breathing effort HEART: regular rate & rhythm and no murmurs with mild lower extremity edema ABDOMEN:abdomen soft, non-tender and normal bowel sounds Musculoskeletal:no cyanosis of digits and no clubbing  NEURO: alert & oriented x 3 with fluent speech, no focal motor/sensory deficits  LABORATORY DATA:  I have reviewed the data as listed    Component Value Date/Time   NA 141 11/22/2019 1202   NA 139  04/14/2017 0742   K 4.1 11/22/2019 1202   K 4.0 04/14/2017 0742   CL 107 11/22/2019 1202   CO2 24 11/22/2019 1202   CO2 21 (L) 04/14/2017 0742   GLUCOSE 113 (H) 11/22/2019 1202   GLUCOSE 247 (H) 04/14/2017 0742   BUN 18 11/22/2019 1202   BUN 13.1 04/14/2017 0742   CREATININE 0.99 11/22/2019 1202   CREATININE 0.87 07/19/2019 0951   CREATININE 0.9 04/14/2017 0742   CALCIUM 9.9 11/22/2019 1202   CALCIUM 9.2 04/14/2017 0742   PROT 7.0 11/22/2019 1202   PROT 7.3 04/14/2017 0742   ALBUMIN 3.8 11/22/2019 1202   ALBUMIN 3.9 04/14/2017 0742   AST 15 11/22/2019 1202   AST 21 07/19/2019 0951   AST 17 04/14/2017 0742   ALT 9 11/22/2019 1202   ALT 13 07/19/2019 0951   ALT 14 04/14/2017 0742   ALKPHOS 85 11/22/2019 1202   ALKPHOS 70 04/14/2017 0742   BILITOT 0.6 11/22/2019 1202   BILITOT 0.3 07/19/2019 0951   BILITOT 0.37 04/14/2017 0742   GFRNONAA 60 (L) 11/22/2019 1202   GFRNONAA >60 07/19/2019 0951   GFRAA >60 11/22/2019 1202   GFRAA >60 07/19/2019 0951    No results found for: SPEP, UPEP  Lab Results  Component Value Date   WBC 4.7 11/22/2019   NEUTROABS 2.6 11/22/2019   HGB 11.9 (L) 11/22/2019   HCT 35.0 (L) 11/22/2019   MCV 94.6 11/22/2019   PLT 165 11/22/2019      Chemistry      Component Value Date/Time   NA 141 11/22/2019 1202   NA 139 04/14/2017 0742   K 4.1 11/22/2019 1202   K 4.0 04/14/2017 0742   CL 107 11/22/2019 1202   CO2 24 11/22/2019 1202   CO2 21 (L) 04/14/2017 0742   BUN 18 11/22/2019 1202   BUN 13.1 04/14/2017 0742   CREATININE 0.99 11/22/2019 1202   CREATININE 0.87 07/19/2019 0951   CREATININE 0.9 04/14/2017 0742      Component Value Date/Time   CALCIUM 9.9 11/22/2019 1202   CALCIUM 9.2 04/14/2017 0742   ALKPHOS 85 11/22/2019 1202   ALKPHOS 70 04/14/2017 0742   AST 15 11/22/2019 1202   AST 21 07/19/2019 0951   AST 17 04/14/2017 0742   ALT 9 11/22/2019 1202   ALT 13 07/19/2019 0951   ALT 14 04/14/2017 0742   BILITOT 0.6 11/22/2019 1202    BILITOT 0.3 07/19/2019 0951   BILITOT 0.37 04/14/2017 0742

## 2019-12-02 ENCOUNTER — Telehealth: Payer: Self-pay

## 2019-12-02 NOTE — Telephone Encounter (Signed)
Called back and given below message. She verbalized understanding. 

## 2019-12-02 NOTE — Telephone Encounter (Signed)
She called and left a message. She is leaving for the beach tomorrow. Her bp has been over 140 x 4 since last treatment on 8/2.  8/3 bp 144/85 8/5 bp 147/87 8/6 bp 141/81 8/11 bp 140/82  Just FYI she said.

## 2019-12-02 NOTE — Telephone Encounter (Signed)
Ok no change in Rx

## 2019-12-13 ENCOUNTER — Other Ambulatory Visit: Payer: Self-pay

## 2019-12-13 ENCOUNTER — Inpatient Hospital Stay (HOSPITAL_BASED_OUTPATIENT_CLINIC_OR_DEPARTMENT_OTHER): Payer: BC Managed Care – PPO | Admitting: Hematology and Oncology

## 2019-12-13 ENCOUNTER — Inpatient Hospital Stay: Payer: BC Managed Care – PPO

## 2019-12-13 ENCOUNTER — Encounter: Payer: Self-pay | Admitting: Hematology and Oncology

## 2019-12-13 VITALS — BP 145/70 | HR 68 | Resp 18 | Ht 65.0 in | Wt 168.8 lb

## 2019-12-13 DIAGNOSIS — Z7189 Other specified counseling: Secondary | ICD-10-CM

## 2019-12-13 DIAGNOSIS — C5701 Malignant neoplasm of right fallopian tube: Secondary | ICD-10-CM

## 2019-12-13 DIAGNOSIS — I10 Essential (primary) hypertension: Secondary | ICD-10-CM

## 2019-12-13 DIAGNOSIS — Z79899 Other long term (current) drug therapy: Secondary | ICD-10-CM | POA: Diagnosis not present

## 2019-12-13 DIAGNOSIS — D6481 Anemia due to antineoplastic chemotherapy: Secondary | ICD-10-CM

## 2019-12-13 DIAGNOSIS — T451X5A Adverse effect of antineoplastic and immunosuppressive drugs, initial encounter: Secondary | ICD-10-CM | POA: Diagnosis not present

## 2019-12-13 DIAGNOSIS — C562 Malignant neoplasm of left ovary: Secondary | ICD-10-CM

## 2019-12-13 DIAGNOSIS — Z5112 Encounter for antineoplastic immunotherapy: Secondary | ICD-10-CM | POA: Diagnosis not present

## 2019-12-13 DIAGNOSIS — C569 Malignant neoplasm of unspecified ovary: Secondary | ICD-10-CM

## 2019-12-13 LAB — COMPREHENSIVE METABOLIC PANEL
ALT: 8 U/L (ref 0–44)
AST: 14 U/L — ABNORMAL LOW (ref 15–41)
Albumin: 3.6 g/dL (ref 3.5–5.0)
Alkaline Phosphatase: 84 U/L (ref 38–126)
Anion gap: 8 (ref 5–15)
BUN: 15 mg/dL (ref 8–23)
CO2: 25 mmol/L (ref 22–32)
Calcium: 9.4 mg/dL (ref 8.9–10.3)
Chloride: 107 mmol/L (ref 98–111)
Creatinine, Ser: 1.02 mg/dL — ABNORMAL HIGH (ref 0.44–1.00)
GFR calc Af Amer: >60 mL/min (ref >60)
GFR calc non Af Amer: 58 mL/min — ABNORMAL LOW (ref >60)
Glucose, Bld: 111 mg/dL — ABNORMAL HIGH (ref 70–99)
Potassium: 3.7 mmol/L (ref 3.5–5.1)
Sodium: 140 mmol/L (ref 135–145)
Total Bilirubin: 0.4 mg/dL (ref 0.3–1.2)
Total Protein: 6.9 g/dL (ref 6.5–8.1)

## 2019-12-13 LAB — CBC WITH DIFFERENTIAL (CANCER CENTER ONLY)
Abs Immature Granulocytes: 0.03 10*3/uL (ref 0.00–0.07)
Basophils Absolute: 0 10*3/uL (ref 0.0–0.1)
Basophils Relative: 1 %
Eosinophils Absolute: 0.2 10*3/uL (ref 0.0–0.5)
Eosinophils Relative: 5 %
HCT: 35.8 % — ABNORMAL LOW (ref 36.0–46.0)
Hemoglobin: 11.9 g/dL — ABNORMAL LOW (ref 12.0–15.0)
Immature Granulocytes: 1 %
Lymphocytes Relative: 32 %
Lymphs Abs: 1.5 10*3/uL (ref 0.7–4.0)
MCH: 31.9 pg (ref 26.0–34.0)
MCHC: 33.2 g/dL (ref 30.0–36.0)
MCV: 96 fL (ref 80.0–100.0)
Monocytes Absolute: 0.4 10*3/uL (ref 0.1–1.0)
Monocytes Relative: 8 %
Neutro Abs: 2.6 10*3/uL (ref 1.7–7.7)
Neutrophils Relative %: 53 %
Platelet Count: 157 10*3/uL (ref 150–400)
RBC: 3.73 MIL/uL — ABNORMAL LOW (ref 3.87–5.11)
RDW: 12.6 % (ref 11.5–15.5)
WBC Count: 4.8 10*3/uL (ref 4.0–10.5)
nRBC: 0 % (ref 0.0–0.2)

## 2019-12-13 LAB — TOTAL PROTEIN, URINE DIPSTICK: Protein, ur: NEGATIVE mg/dL

## 2019-12-13 MED ORDER — SODIUM CHLORIDE 0.9% FLUSH
10.0000 mL | INTRAVENOUS | Status: DC | PRN
  Administered 2019-12-13: 10 mL
  Filled 2019-12-13: qty 10

## 2019-12-13 MED ORDER — SODIUM CHLORIDE 0.9 % IV SOLN
Freq: Once | INTRAVENOUS | Status: AC
  Filled 2019-12-13: qty 250

## 2019-12-13 MED ORDER — SODIUM CHLORIDE 0.9% FLUSH
10.0000 mL | Freq: Once | INTRAVENOUS | Status: AC
  Administered 2019-12-13: 10 mL
  Filled 2019-12-13: qty 10

## 2019-12-13 MED ORDER — SODIUM CHLORIDE 0.9 % IV SOLN
15.0000 mg/kg | Freq: Once | INTRAVENOUS | Status: AC
  Administered 2019-12-13: 1200 mg via INTRAVENOUS
  Filled 2019-12-13: qty 48

## 2019-12-13 MED ORDER — HEPARIN SOD (PORK) LOCK FLUSH 100 UNIT/ML IV SOLN
500.0000 [IU] | Freq: Once | INTRAVENOUS | Status: AC | PRN
  Administered 2019-12-13: 500 [IU]
  Filled 2019-12-13: qty 5

## 2019-12-13 NOTE — Assessment & Plan Note (Signed)
So far, she tolerated bevacizumab well Her blood pressure is satisfactory She will continue treatment every 3 weeks as scheduled, we will plan to repeat imaging study next month

## 2019-12-13 NOTE — Patient Instructions (Signed)
[facility] Discharge Instructions for Patients Receiving Chemotherapy  Today you received the following chemotherapy agents: bevacizumab  To help prevent nausea and vomiting after your treatment, we encourage you to take your nausea medication as directed.   If you develop nausea and vomiting that is not controlled by your nausea medication, call the clinic.   BELOW ARE SYMPTOMS THAT SHOULD BE REPORTED IMMEDIATELY:  *FEVER GREATER THAN 100.5 F  *CHILLS WITH OR WITHOUT FEVER  NAUSEA AND VOMITING THAT IS NOT CONTROLLED WITH YOUR NAUSEA MEDICATION  *UNUSUAL SHORTNESS OF BREATH  *UNUSUAL BRUISING OR BLEEDING  TENDERNESS IN MOUTH AND THROAT WITH OR WITHOUT PRESENCE OF ULCERS  *URINARY PROBLEMS  *BOWEL PROBLEMS  UNUSUAL RASH Items with * indicate a potential emergency and should be followed up as soon as possible.  Feel free to call the clinic should you have any questions or concerns. The clinic phone number is [phone].  Please show the CHEMO ALERT CARD at check-in to the Emergency Department and triage nurse.   

## 2019-12-13 NOTE — Assessment & Plan Note (Signed)
Her blood pressure is stable Observe only for now 

## 2019-12-13 NOTE — Progress Notes (Signed)
[facility] OFFICE PROGRESS NOTE  Patient Care Team: Gaynelle Arabian, MD as PCP - General (Family Medicine) Heath Lark, MD as Consulting Physician (Hematology and Oncology) Everitt Amber, MD as Consulting Physician (Obstetrics and Gynecology)  ASSESSMENT & PLAN:  Adenocarcinoma of right fallopian tube [facility]) So far, she tolerated bevacizumab well Her blood pressure is satisfactory She will continue treatment every 3 weeks as scheduled, we will plan to repeat imaging study next month  Essential hypertension Her blood pressure is stable Observe only for now  Anemia due to antineoplastic chemotherapy Her pancytopenia has almost resolved since discontinuation of chemotherapy Observe for now   Orders Placed This Encounter  Procedures  . CT ABDOMEN PELVIS W CONTRAST    Standing Status:   Future    Standing Expiration Date:   12/12/2020    Order Specific Question:   If indicated for the ordered procedure, I authorize the administration of contrast media per Radiology protocol    Answer:   Yes    Order Specific Question:   Preferred imaging location?    Answer:   [facility]    Order Specific Question:   Radiology Contrast Protocol - do NOT remove file path    Answer:   [email or web address]    All questions were answered. The patient knows to call the clinic with any problems, questions or concerns. The total time spent in the appointment was 20 minutes encounter with patients including review of chart and various tests results, discussions about plan of care and coordination of care plan   Heath Lark, MD 12/13/2019 12:25 PM  INTERVAL HISTORY: Please see below for problem oriented charting. She returns for further follow-up Her blood pressure control at home is satisfactory Systolic blood pressure ranged from 798-9 47, diastolic blood pressure from 69-93 She is not symptomatic Denies abdominal pain, new lymphadenopathy or recent  infection  SUMMARY OF ONCOLOGIC HISTORY: Oncology History Overview Note  Neg genetics   Adenocarcinoma of right fallopian tube ([place])  08/12/2016 Initial Diagnosis   The patient has many months of vague symptoms of fullness in the pelvis, urinary frequency and difficulty with defecation. She denies vaginal bleeding or rectal bleeding.    08/12/2016 Imaging   Abdomen X-ray in the ER: Moderate colonic stool burden without evidence of enteric obstruction   11/13/2016 Imaging   TVUS was performed on 11/13/16 which showed a large hypoechoic lobular mass seen posterior to the uterus measuring 18.2x16.1x11.8cm, blood flow was seen along the periphery of the mass. It was considered to be likely to be a fibroid vs pelvic mass vs ovarian mass. Neither ovary was removed. Moderate amount of free fluid in the pelvis. THe uterus measures 4x10x4cm. The endometrium is 28m.    12/05/2016 Imaging   MR pelvis 1. Mild limitations as detailed above. 2. Heterogeneous pelvic mass is favored to arise from the right ovary. Favor a solid ovarian neoplasm such as fibroma/Brenner's tumor. Given lesion size and heterogeneous T2 signal out, complicating torsion cannot be excluded. 3. Moderate abdominopelvic ascites, without specific evidence of peritoneal metastasis. Consider further evaluation with contrast-enhanced abdominopelvic CT.    12/26/2016 Pathology Results   1. Uterus, ovaries and fallopian tubes - ADENOCARCINOMA INVOLVING RIGHT FALLOPIAN TUBE, RIGHT AND LEFT OVARIES, UTERINE SEROSA AND PELVIC MASS. - SEE ONCOLOGY TABLE AND COMMENT. - UTERINE CERVIX, ENDOMETRIUM, MYOMETRIUM AND LEFT FALLOPIAN TUBE FREE OF TUMOR. 2. Omentum, resection for tumor - ADENOCARCINOMA. Microscopic Comment 1. ONCOLOGY TABLE - FALLOPIAN TUBE. SEE COMMENT 1. Specimen, including laterality:  Uterus, bilateral adnexa, pelvic mass and omentum. 2. Procedure: Hysterectomy with bilateral salpingo-oophorectomy, pelvic mass excision and  omentectomy. 3. Lymph node sampling performed: No 4. Tumor site: See comment. 5. Tumor location in fallopian tube: Right fallopian tube fimbria 6. Specimen integrity (intact/ruptured/disrupted): Intact 7. Tumor size (cm): 18 cm, see comment. 8. Histologic type: Adenocarcinoma 9. Grade: High grade 10. Microscopic tumor extension: Tumor involves right fallopian tube, right and left ovaries, uterine serosa, pelvic mass and omentum. 11. Margins: See comment. 12. Lymph-Vascular invasion: Present 13. Lymph nodes: # examined: 0; # positive: N/A 14. TNM: pT3c, pNX 15. FIGO Stage (based on pathologic findings, needs clinical correlation: III-C 16. Comments: There is an 18 cm pelvic mass which is a high grade adenocarcinoma and there is a 12.2 cm  segment of omentum which is extensively involved with adenocarcinoma. The tumor also involves the fimbria of the right fallopian tube and the parenchyma of the right and left ovaries as well as uterine serosa. The fimbria of the right fallopian has intraepithelial atypia consistent with a precursor lesion and therefore this is most consistent with primary fallopian tube adenocarcinoma. A primary peritoneal serous carcinoma is a less likely possibility.    12/26/2016 Pathology Results   PERITONEAL/ASCITIC FLUID(SPECIMEN 1 OF 1 COLLECTED 12/26/16): POORLY DIFFERENTIATED ADENOCARCINOMA   12/26/2016 Surgery   Procedure(s) Performed: Exploratory laparotomy with total abdominal hysterectomy, bilateral salpingo-oophorectomy, omentectomy radical tumor debulking for ovarian cancer .  Surgeon: Thereasa Solo, MD.   Operative Findings: 20cm left ovarian mass densely adherent to the posterior uterus, right tube and ovary, cervix, sigmoid colon. 10cm omental cake. 4L ascites. 47m size tumor nodules on the serosa of the terminal ileum and proximal sigmoid colon. 160mnodules on right diaphragm.    This represented an optimal cytoreduction (R1) with 49m67modules on intestine  and diaphragm representing gross visible disease   01/16/2017 Procedure   Placement of single lumen port a cath via right internal jugular vein. The catheter tip lies at the cavo-atrial junction. A power injectable port a cath was placed and is ready for immediate use.   01/17/2017 Imaging   1. 3.5 x 7.2 x 4.6 cm fluid collection along the vaginal cuff, posterior the bladder and extending into the right adnexal space. This lesion demonstrates rim enhancement. Imaging features could be related to a loculated postoperative seroma or hematoma. Superinfection cannot be excluded by CT. Given debulking surgery was 3 weeks ago, this entire structure is un likely to represent neoplasm, but peritoneal involvement could have this appearance. 2. Small fluid collection in the left para colic gutter without rim enhancement. 3. Irregular/nodular appearance of the peritoneal M in the anatomic pelvis with areas of subtle nodularity between the stomach and the spleen. Close attention in these regions on follow-up recommended as metastatic disease is a concern. 4. Mildly enlarged hepatoduodenal ligament lymph node. Attention on follow-up recommended.   01/20/2017 Tumor Marker   Patient's tumor was tested for the following markers: CA-125 Results of the tumor marker test revealed 46.6   01/28/2017 Genetic Testing       Negative genetic testing on the [facility].  The [facility] panel offered by [employer] sequencing and deletion/duplication testing of the following 28 genes: APC, ATM, BARD1, BMPR1A, BRCA1, BRCA2, BRIP1, CHD1, CDK4, CDKN2A, CHEK2, EPCAM (large rearrangement only), MLH1, MSH2, MSH6, MUTYH, NBN, PALB2, PMS2, PTEN, RAD51C, RAD51D, SMAD4, STK11, and TP53. Sequencing was performed for select regions of POLE and POLD1, and large rearrangement analysis was  performed for select regions of GREM1. The report date is January 28, 2017.  HRD testing looking for genomic instability  and BRCA mutations was negative.  The report date of this test is January 27, 2017.    01/31/2017 Tumor Marker   Patient's tumor was tested for the following markers: CA-125 Results of the tumor marker test revealed 22.4   03/04/2017 Tumor Marker   Patient's tumor was tested for the following markers: CA-125 Results of the tumor marker test revealed 13.8   04/18/2017 Tumor Marker   Patient's tumor was tested for the following markers: CA-125 Results of the tumor marker test revealed 11.9   05/05/2017 Tumor Marker   Patient's tumor was tested for the following markers: CA-125 Results of the tumor marker test revealed 12   05/26/2017 Tumor Marker   Patient's tumor was tested for the following markers: CA-125 Results of the tumor marker test revealed 10.6   05/26/2017 Imaging   1. A previously noted postoperative fluid collection in the low pelvis and residual ascites seen on the prior examination has resolved on today's study. No findings to suggest residual/recurrent disease on today's examination. No definite solid organ metastasis identified in the abdomen or pelvis. No lymphadenopathy. 2. Aortic atherosclerosis.   06/10/2017 Procedure   Successful right IJ vein Port-A-Cath explant.   03/09/2018 Tumor Marker   Patient's tumor was tested for the following markers: CA-125 Results of the tumor marker test revealed 12.1   05/26/2018 Tumor Marker   Patient's tumor was tested for the following markers: CA-125 Results of the tumor marker test revealed 13.8   09/09/2018 Tumor Marker   Patient's tumor was tested for the following markers: CA-125 Results of the tumor marker test revealed 23.9   12/18/2018 Tumor Marker   Patient's tumor was tested for the following markers: CA-125 Results of the tumor marker test revealed 43.8   02/27/2019 - 02/28/2019 Hospital Admission   She was admitted for bowel obstruction   02/27/2019 Imaging   1. High-grade small bowel obstruction with transition  point in the pelvis in an area of suspected desmoplastic reaction surrounding a 1.3 cm spiculated nodule in the mesentery, suspicious for carcinoid. There is hyperenhancement near the tip of the appendix and loss of the normal fat plane between it and the adjacent sigmoid colon, potentially the site of primary lesion. 2. Multiple new small subcentimeter hyperdense lesions scattered along the liver capsule, concerning for metastases. Enlarged hyperenhancing gastrohepatic and portacaval lymph nodes concerning for nodal metastases. 3. Very mild right hydroureteronephrosis to the level of the desmoplastic reaction in the pelvis, potentially involving the right ureter. 4. Infraumbilical ventral abdominal wall diastasis containing a small nondilated portion of transverse colon. 5. Trace ascites. 6. Cholelithiasis. 7.  Aortic atherosclerosis (ICD10-I70.0).     03/05/2019 Tumor Marker   Patient's tumor was tested for the following markers: CA-125 Results of the tumor marker test revealed 70.1   03/12/2019 Echocardiogram   IMPRESSIONS     1. Left ventricular ejection fraction, by visual estimation, is 60 to 65%. The left ventricle has normal function. There is no left ventricular hypertrophy.  2. Left ventricular diastolic parameters are consistent with Grade I diastolic dysfunction (impaired relaxation).  3. Global right ventricle has normal systolic function.The right ventricular size is normal. No increase in right ventricular wall thickness.  4. Left atrial size was normal.  5. Right atrial size was normal.  6. The pericardium was not assessed.  7. The mitral valve is normal in structure. No  evidence of mitral valve regurgitation.  8. The tricuspid valve is normal in structure. Tricuspid valve regurgitation is trivial.  9. The aortic valve is normal in structure. Aortic valve regurgitation is not visualized. 10. The pulmonic valve was grossly normal. Pulmonic valve regurgitation is not  visualized. 11. The average left ventricular global longitudinal strain is -17.6 %.   03/16/2019 Procedure   Successful placement of a right internal jugular approach power injectable Port-A-Cath. The catheter is ready for immediate use.     04/19/2019 Tumor Marker   Patient's tumor was tested for the following markers: CA-125 Results of the tumor marker test revealed 39.8   05/17/2019 Tumor Marker   Patient's tumor was tested for the following markers: CA-125 Results of the tumor marker test revealed 27   05/28/2019 Tumor Marker   Patient's tumor was tested for the following markers: CA-125 Results of the tumor marker test revealed 27.7.   06/11/2019 Imaging   1. No new or progressive metastatic disease in the abdomen or pelvis. 2. Tiny noncalcified perisplenic implant is decreased. Calcified pericardiophrenic, retroperitoneal and bilateral inguinal lymph nodes and scattered small calcified perihepatic and perisplenic implants are stable. 3.  Aortic Atherosclerosis (ICD10-I70.0).       06/17/2019 Echocardiogram    1. Left ventricular ejection fraction, by estimation, is 65 to 70%. The left ventricle has normal function. The left ventricle has no regional wall motion abnormalities. Left ventricular diastolic parameters were normal.  2. Right ventricular systolic function is normal. The right ventricular size is normal.  3. The mitral valve is normal in structure and function. No evidence of mitral valve regurgitation. No evidence of mitral stenosis.  4. The aortic valve is normal in structure and function. Aortic valve regurgitation is not visualized. No aortic stenosis is present.   06/21/2019 Tumor Marker   Patient's tumor was tested for the following markers: CA-125 Results of the tumor marker test revealed 18.7   07/19/2019 Tumor Marker   Patient's tumor was tested for the following markers: CA-125 Results of the tumor marker test revealed 16.7   08/30/2019 Tumor Marker    Patient's tumor was tested for the following markers: CA-125. Results of the tumor marker test revealed 13.9   09/15/2019 Echocardiogram    1. Left ventricular ejection fraction, by estimation, is 65 to 70%. The left ventricle has normal function. The left ventricle has no regional wall motion abnormalities. Left ventricular diastolic parameters are consistent with Grade I diastolic dysfunction (impaired relaxation). The average left ventricular global longitudinal strain is 18.1 %. The global longitudinal strain is normal.  2. Right ventricular systolic function is normal. The right ventricular size is normal.  3. The mitral valve is normal in structure. No evidence of mitral valve regurgitation. No evidence of mitral stenosis.  4. The aortic valve is normal in structure. Aortic valve regurgitation is not visualized. No aortic stenosis is present.  5. The inferior vena cava is normal in size with greater than 50% respiratory variability, suggesting right atrial pressure of 3 mmHg.     09/24/2019 Imaging   1. Stable exam. No new or progressive metastatic disease on today's study. 2. Small subcapsular hypodensity in the lateral spleen, new in the interval, but indeterminate. Attention on follow-up recommended. 3. The calcified upper abdominal and groin lymph nodes are stable as are the scattered calcified and noncalcified peritoneal implants. 4. Stable midline ventral hernia containing a short segment of colon without complicating features. 5. Aortic Atherosclerosis (ICD10-I70.0).     10/04/2019 -  Chemotherapy   The patient had bevacizumab-bvzr (ZIRABEV) 1,200 mg in sodium chloride 0.9 % 100 mL chemo infusion, 15 mg/kg = 1,200 mg, Intravenous,  Once, 3 of 5 cycles Administration: 1,200 mg (10/04/2019), 1,200 mg (11/22/2019), 1,200 mg (11/01/2019)  for chemotherapy treatment.    10/04/2019 Tumor Marker   Patient's tumor was tested for the following markers: CA-125. Results of the tumor marker test  revealed 12.9.   11/01/2019 Tumor Marker   Patient's tumor was tested for the following markers: CA-125 Results of the tumor marker test revealed 15.4     REVIEW OF SYSTEMS:   Constitutional: Denies fevers, chills or abnormal weight loss Eyes: Denies blurriness of vision Ears, nose, mouth, throat, and face: Denies mucositis or sore throat Respiratory: Denies cough, dyspnea or wheezes Cardiovascular: Denies palpitation, chest discomfort or lower extremity swelling Gastrointestinal:  Denies nausea, heartburn or change in bowel habits Skin: Denies abnormal skin rashes Lymphatics: Denies new lymphadenopathy or easy bruising Neurological:Denies numbness, tingling or new weaknesses Behavioral/Psych: Mood is stable, no new changes  All other systems were reviewed with the patient and are negative.  I have reviewed the past medical history, past surgical history, social history and family history with the patient and they are unchanged from previous note.  ALLERGIES:  is allergic to dilaudid [hydromorphone hcl], morphine and related, and sudafed [pseudoephedrine hcl].  MEDICATIONS:  Current Outpatient Medications  Medication Sig Dispense Refill  . acetaminophen (TYLENOL) 325 MG tablet Take 975 mg by mouth every 6 (six) hours as needed (pain).    Marland Kitchen amLODipine (NORVASC) 10 MG tablet Take 1 tablet (10 mg total) by mouth daily. 30 tablet 9  . amoxicillin (AMOXIL) 500 MG capsule Take 1 capsule (500 mg total) by mouth 2 (two) times daily. 10 capsule 0  . HYDROcodone-acetaminophen (NORCO/VICODIN) 5-325 MG tablet Take 1 tablet by mouth every 6 (six) hours as needed.    . lidocaine-prilocaine (EMLA) cream APP EXT AA 1 TIME    . magic mouthwash w/lidocaine SOLN Take 5 mLs by mouth 4 (four) times daily. Take 5 ml's 4 x daily for 7 days. Swish and spit. 140 mL 0  . methylPREDNISolone (MEDROL DOSEPAK) 4 MG TBPK tablet Take 4 mg by mouth See admin instructions. follow package directions    . ondansetron  (ZOFRAN) 8 MG tablet Take 1 tablet (8 mg total) by mouth every 8 (eight) hours as needed. 30 tablet 1  . prochlorperazine (COMPAZINE) 10 MG tablet TAKE 1 TABLET BY MOUTH EVERY 6 HOURS AS NEEDED FOR NAUSEA AND/OR VOMITING 30 tablet 1  . promethazine (PHENERGAN) 25 MG suppository Place 1 suppository (25 mg total) rectally 2 (two) times daily as needed for nausea or vomiting. 12 each 0  . traMADol (ULTRAM) 50 MG tablet Take 50 mg by mouth 3 (three) times daily as needed.    . venlafaxine XR (EFFEXOR-XR) 37.5 MG 24 hr capsule TAKE 1 CAPSULE BY MOUTH DAILY WITH BREAKFAST (Patient taking differently: Take 37.5 mg by mouth daily with breakfast. ) 90 capsule 6   No current facility-administered medications for this visit.   Facility-Administered Medications Ordered in Other Visits  Medication Dose Route Frequency Provider Last Rate Last Admin  . sodium chloride flush (NS) 0.9 % injection 10 mL  10 mL Intravenous PRN Alvy Bimler, Kylii Ennis, MD   10 mL at 02/11/17 0839  . sodium chloride flush (NS) 0.9 % injection 10 mL  10 mL Intravenous PRN Alvy Bimler, Nadine Ryle, MD   10 mL at 03/04/17 1510  PHYSICAL EXAMINATION: ECOG PERFORMANCE STATUS: 1 - Symptomatic but completely ambulatory  Vitals:   12/13/19 1202  BP: (!) 145/70  Pulse: 68  Resp: 18  SpO2: 100%   Filed Weights   12/13/19 1202  Weight: 168 lb 12.8 oz (76.6 kg)    GENERAL:alert, no distress and comfortable SKIN: skin color, texture, turgor are normal, no rashes or significant lesions EYES: normal, Conjunctiva are pink and non-injected, sclera clear OROPHARYNX:no exudate, no erythema and lips, buccal mucosa, and tongue normal  NECK: supple, thyroid normal size, non-tender, without nodularity LYMPH:  no palpable lymphadenopathy in the cervical, axillary or inguinal LUNGS: clear to auscultation and percussion with normal breathing effort HEART: regular rate & rhythm and no murmurs and no lower extremity edema ABDOMEN:abdomen soft, non-tender and normal  bowel sounds Musculoskeletal:no cyanosis of digits and no clubbing  NEURO: alert & oriented x 3 with fluent speech, no focal motor/sensory deficits  LABORATORY DATA:  I have reviewed the data as listed    Component Value Date/Time   NA 140 12/13/2019 1138   NA 139 04/14/2017 0742   K 3.7 12/13/2019 1138   K 4.0 04/14/2017 0742   CL 107 12/13/2019 1138   CO2 25 12/13/2019 1138   CO2 21 (L) 04/14/2017 0742   GLUCOSE 111 (H) 12/13/2019 1138   GLUCOSE 247 (H) 04/14/2017 0742   BUN 15 12/13/2019 1138   BUN 13.1 04/14/2017 0742   CREATININE 1.02 (H) 12/13/2019 1138   CREATININE 0.87 07/19/2019 0951   CREATININE 0.9 04/14/2017 0742   CALCIUM 9.4 12/13/2019 1138   CALCIUM 9.2 04/14/2017 0742   PROT 6.9 12/13/2019 1138   PROT 7.3 04/14/2017 0742   ALBUMIN 3.6 12/13/2019 1138   ALBUMIN 3.9 04/14/2017 0742   AST 14 (L) 12/13/2019 1138   AST 21 07/19/2019 0951   AST 17 04/14/2017 0742   ALT 8 12/13/2019 1138   ALT 13 07/19/2019 0951   ALT 14 04/14/2017 0742   ALKPHOS 84 12/13/2019 1138   ALKPHOS 70 04/14/2017 0742   BILITOT 0.4 12/13/2019 1138   BILITOT 0.3 07/19/2019 0951   BILITOT 0.37 04/14/2017 0742   GFRNONAA 58 (L) 12/13/2019 1138   GFRNONAA >60 07/19/2019 0951   GFRAA >60 12/13/2019 1138   GFRAA >60 07/19/2019 0951    No results found for: SPEP, UPEP  Lab Results  Component Value Date   WBC 4.8 12/13/2019   NEUTROABS 2.6 12/13/2019   HGB 11.9 (L) 12/13/2019   HCT 35.8 (L) 12/13/2019   MCV 96.0 12/13/2019   PLT 157 12/13/2019      Chemistry      Component Value Date/Time   NA 140 12/13/2019 1138   NA 139 04/14/2017 0742   K 3.7 12/13/2019 1138   K 4.0 04/14/2017 0742   CL 107 12/13/2019 1138   CO2 25 12/13/2019 1138   CO2 21 (L) 04/14/2017 0742   BUN 15 12/13/2019 1138   BUN 13.1 04/14/2017 0742   CREATININE 1.02 (H) 12/13/2019 1138   CREATININE 0.87 07/19/2019 0951   CREATININE 0.9 04/14/2017 0742      Component Value Date/Time   CALCIUM 9.4  12/13/2019 1138   CALCIUM 9.2 04/14/2017 0742   ALKPHOS 84 12/13/2019 1138   ALKPHOS 70 04/14/2017 0742   AST 14 (L) 12/13/2019 1138   AST 21 07/19/2019 0951   AST 17 04/14/2017 0742   ALT 8 12/13/2019 1138   ALT 13 07/19/2019 0951   ALT 14 04/14/2017 0742   BILITOT  0.4 12/13/2019 1138   BILITOT 0.3 07/19/2019 0951   BILITOT 0.37 04/14/2017 0742

## 2019-12-13 NOTE — Patient Instructions (Signed)

## 2019-12-13 NOTE — Assessment & Plan Note (Signed)
Her pancytopenia has almost resolved since discontinuation of chemotherapy Observe for now

## 2019-12-14 ENCOUNTER — Telehealth: Payer: Self-pay

## 2019-12-14 LAB — CA 125: Cancer Antigen (CA) 125: 14.8 U/mL (ref 0.0–38.1)

## 2019-12-14 NOTE — Telephone Encounter (Signed)
-----   Message from Heath Lark, MD sent at 12/14/2019  9:03 AM EDT ----- Regarding: let her know CA-125 is stable

## 2019-12-14 NOTE — Telephone Encounter (Signed)
Called and left below message. Ask her to call the office for questions. ?

## 2019-12-31 ENCOUNTER — Encounter (HOSPITAL_COMMUNITY): Payer: Self-pay

## 2019-12-31 ENCOUNTER — Ambulatory Visit (HOSPITAL_COMMUNITY)
Admission: RE | Admit: 2019-12-31 | Discharge: 2019-12-31 | Disposition: A | Discharge status: Home / Self Care | Payer: BC Managed Care – PPO | Source: Ambulatory Visit | Attending: Hematology and Oncology | Admitting: Hematology and Oncology

## 2019-12-31 ENCOUNTER — Other Ambulatory Visit: Payer: Self-pay

## 2019-12-31 DIAGNOSIS — K469 Unspecified abdominal hernia without obstruction or gangrene: Secondary | ICD-10-CM | POA: Diagnosis not present

## 2019-12-31 DIAGNOSIS — K439 Ventral hernia without obstruction or gangrene: Secondary | ICD-10-CM | POA: Diagnosis not present

## 2019-12-31 DIAGNOSIS — C5701 Malignant neoplasm of right fallopian tube: Secondary | ICD-10-CM | POA: Diagnosis not present

## 2019-12-31 DIAGNOSIS — K8020 Calculus of gallbladder without cholecystitis without obstruction: Secondary | ICD-10-CM | POA: Diagnosis not present

## 2019-12-31 DIAGNOSIS — D7389 Other diseases of spleen: Secondary | ICD-10-CM | POA: Diagnosis not present

## 2019-12-31 MED ORDER — IOHEXOL 300 MG/ML  SOLN
100.0000 mL | Freq: Once | INTRAMUSCULAR | Status: AC | PRN
  Administered 2019-12-31: 100 mL via INTRAVENOUS

## 2020-01-03 ENCOUNTER — Inpatient Hospital Stay: Payer: BC Managed Care – PPO

## 2020-01-03 ENCOUNTER — Inpatient Hospital Stay (HOSPITAL_BASED_OUTPATIENT_CLINIC_OR_DEPARTMENT_OTHER): Payer: BC Managed Care – PPO | Admitting: Hematology and Oncology

## 2020-01-03 ENCOUNTER — Other Ambulatory Visit: Payer: Self-pay

## 2020-01-03 ENCOUNTER — Encounter: Payer: Self-pay | Admitting: Hematology and Oncology

## 2020-01-03 ENCOUNTER — Inpatient Hospital Stay: Payer: BC Managed Care – PPO | Attending: Hematology and Oncology

## 2020-01-03 VITALS — BP 124/64 | HR 65 | Temp 97.8°F | Resp 16

## 2020-01-03 DIAGNOSIS — Z5112 Encounter for antineoplastic immunotherapy: Secondary | ICD-10-CM | POA: Diagnosis not present

## 2020-01-03 DIAGNOSIS — I10 Essential (primary) hypertension: Secondary | ICD-10-CM

## 2020-01-03 DIAGNOSIS — C5701 Malignant neoplasm of right fallopian tube: Secondary | ICD-10-CM | POA: Diagnosis not present

## 2020-01-03 DIAGNOSIS — Z79899 Other long term (current) drug therapy: Secondary | ICD-10-CM | POA: Insufficient documentation

## 2020-01-03 DIAGNOSIS — C562 Malignant neoplasm of left ovary: Secondary | ICD-10-CM

## 2020-01-03 DIAGNOSIS — Z7189 Other specified counseling: Secondary | ICD-10-CM

## 2020-01-03 DIAGNOSIS — D696 Thrombocytopenia, unspecified: Secondary | ICD-10-CM | POA: Diagnosis not present

## 2020-01-03 DIAGNOSIS — C569 Malignant neoplasm of unspecified ovary: Secondary | ICD-10-CM

## 2020-01-03 LAB — CBC WITH DIFFERENTIAL (CANCER CENTER ONLY)
Abs Immature Granulocytes: 0.03 10*3/uL (ref 0.00–0.07)
Basophils Absolute: 0 10*3/uL (ref 0.0–0.1)
Basophils Relative: 1 %
Eosinophils Absolute: 0.3 10*3/uL (ref 0.0–0.5)
Eosinophils Relative: 6 %
HCT: 37.7 % (ref 36.0–46.0)
Hemoglobin: 12.6 g/dL (ref 12.0–15.0)
Immature Granulocytes: 1 %
Lymphocytes Relative: 27 %
Lymphs Abs: 1.4 10*3/uL (ref 0.7–4.0)
MCH: 31.4 pg (ref 26.0–34.0)
MCHC: 33.4 g/dL (ref 30.0–36.0)
MCV: 94 fL (ref 80.0–100.0)
Monocytes Absolute: 0.5 10*3/uL (ref 0.1–1.0)
Monocytes Relative: 9 %
Neutro Abs: 2.9 10*3/uL (ref 1.7–7.7)
Neutrophils Relative %: 56 %
Platelet Count: 149 10*3/uL — ABNORMAL LOW (ref 150–400)
RBC: 4.01 MIL/uL (ref 3.87–5.11)
RDW: 12.5 % (ref 11.5–15.5)
WBC Count: 5.1 10*3/uL (ref 4.0–10.5)
nRBC: 0 % (ref 0.0–0.2)

## 2020-01-03 LAB — COMPREHENSIVE METABOLIC PANEL
ALT: 11 U/L (ref 0–44)
AST: 17 U/L (ref 15–41)
Albumin: 3.8 g/dL (ref 3.5–5.0)
Alkaline Phosphatase: 81 U/L (ref 38–126)
Anion gap: 6 (ref 5–15)
BUN: 23 mg/dL (ref 8–23)
CO2: 27 mmol/L (ref 22–32)
Calcium: 9.5 mg/dL (ref 8.9–10.3)
Chloride: 106 mmol/L (ref 98–111)
Creatinine, Ser: 1.06 mg/dL — ABNORMAL HIGH (ref 0.44–1.00)
GFR calc Af Amer: >60 mL/min (ref >60)
GFR calc non Af Amer: 55 mL/min — ABNORMAL LOW (ref >60)
Glucose, Bld: 91 mg/dL (ref 70–99)
Potassium: 4.2 mmol/L (ref 3.5–5.1)
Sodium: 139 mmol/L (ref 135–145)
Total Bilirubin: 0.4 mg/dL (ref 0.3–1.2)
Total Protein: 7.4 g/dL (ref 6.5–8.1)

## 2020-01-03 LAB — TOTAL PROTEIN, URINE DIPSTICK: Protein, ur: NEGATIVE mg/dL

## 2020-01-03 MED ORDER — SODIUM CHLORIDE 0.9% FLUSH
10.0000 mL | INTRAVENOUS | Status: DC | PRN
  Administered 2020-01-03: 10 mL
  Filled 2020-01-03: qty 10

## 2020-01-03 MED ORDER — SODIUM CHLORIDE 0.9 % IV SOLN
15.0000 mg/kg | Freq: Once | INTRAVENOUS | Status: AC
  Administered 2020-01-03: 1200 mg via INTRAVENOUS
  Filled 2020-01-03: qty 48

## 2020-01-03 MED ORDER — SODIUM CHLORIDE 0.9 % IV SOLN
Freq: Once | INTRAVENOUS | Status: AC
  Filled 2020-01-03: qty 250

## 2020-01-03 MED ORDER — HEPARIN SOD (PORK) LOCK FLUSH 100 UNIT/ML IV SOLN
500.0000 [IU] | Freq: Once | INTRAVENOUS | Status: AC | PRN
  Administered 2020-01-03: 500 [IU]
  Filled 2020-01-03: qty 5

## 2020-01-03 MED ORDER — SODIUM CHLORIDE 0.9% FLUSH
10.0000 mL | Freq: Once | INTRAVENOUS | Status: AC
  Administered 2020-01-03: 10 mL
  Filled 2020-01-03: qty 10

## 2020-01-03 NOTE — Patient Instructions (Signed)

## 2020-01-03 NOTE — Assessment & Plan Note (Signed)
So far, she tolerated bevacizumab well Her blood pressure is satisfactory She will continue treatment every 3 weeks as scheduled Due to stability of her imaging study, I will space out interval between imaging study Her next imaging would be around March 2022 

## 2020-01-03 NOTE — Assessment & Plan Note (Signed)
This is likely due to recent treatment. The patient denies recent history of bleeding such as epistaxis, hematuria or hematochezia. She is asymptomatic from the low platelet count. I will observe for now.  she does not require transfusion now. I will continue the chemotherapy at current dose without dosage adjustment.  If the thrombocytopenia gets progressive worse in the future, I might have to delay her treatment or adjust the chemotherapy dose.   

## 2020-01-03 NOTE — Assessment & Plan Note (Signed)
Her blood pressure is stable Observe only for now 

## 2020-01-03 NOTE — Patient Instructions (Signed)
[facility] Discharge Instructions for Patients Receiving Chemotherapy  Today you received the following immunotherapy agent: Bevacizumab (Avastin)  To help prevent nausea and vomiting after your treatment, we encourage you to take your nausea medication as directed by your MD.   If you develop nausea and vomiting that is not controlled by your nausea medication, call the clinic.   BELOW ARE SYMPTOMS THAT SHOULD BE REPORTED IMMEDIATELY:  *FEVER GREATER THAN 100.5 F  *CHILLS WITH OR WITHOUT FEVER  NAUSEA AND VOMITING THAT IS NOT CONTROLLED WITH YOUR NAUSEA MEDICATION  *UNUSUAL SHORTNESS OF BREATH  *UNUSUAL BRUISING OR BLEEDING  TENDERNESS IN MOUTH AND THROAT WITH OR WITHOUT PRESENCE OF ULCERS  *URINARY PROBLEMS  *BOWEL PROBLEMS  UNUSUAL RASH Items with * indicate a potential emergency and should be followed up as soon as possible.  Feel free to call the clinic should you have any questions or concerns. The clinic phone number is [phone].  Please show the [facility] at check-in to the Emergency Department and triage nurse.

## 2020-01-03 NOTE — Progress Notes (Signed)
[place] OFFICE PROGRESS NOTE  Patient Care Team: Gaynelle Arabian, MD as PCP - General (Family Medicine) Heath Lark, MD as Consulting Physician (Hematology and Oncology) Everitt Amber, MD as Consulting Physician (Obstetrics and Gynecology)  ASSESSMENT & PLAN:  Adenocarcinoma of right fallopian tube [facility]) So far, she tolerated bevacizumab well Her blood pressure is satisfactory She will continue treatment every 3 weeks as scheduled Due to stability of her imaging study, I will space out interval between imaging study Her next imaging would be around March 2022  Essential hypertension Her blood pressure is stable Observe only for now  Thrombocytopenia [facility]) This is likely due to recent treatment. The patient denies recent history of bleeding such as epistaxis, hematuria or hematochezia. She is asymptomatic from the low platelet count. I will observe for now.  she does not require transfusion now. I will continue the chemotherapy at current dose without dosage adjustment.  If the thrombocytopenia gets progressive worse in the future, I might have to delay her treatment or adjust the chemotherapy dose.     No orders of the defined types were placed in this encounter.   All questions were answered. The patient knows to call the clinic with any problems, questions or concerns. The total time spent in the appointment was 20 minutes encounter with patients including review of chart and various tests results, discussions about plan of care and coordination of care plan   Heath Lark, MD 01/03/2020 12:14 PM  INTERVAL HISTORY: Please see below for problem oriented charting. She returns for treatment and follow-up She is doing well No recent infection, fever or chills Her blood pressure control at home is stable Denies abdominal pain or changes in bowel habits  SUMMARY OF ONCOLOGIC HISTORY: Oncology History Overview Note  Neg genetics   Adenocarcinoma of right  fallopian tube ([place])  08/12/2016 Initial Diagnosis   The patient has many months of vague symptoms of fullness in the pelvis, urinary frequency and difficulty with defecation. She denies vaginal bleeding or rectal bleeding.    08/12/2016 Imaging   Abdomen X-ray in the ER: Moderate colonic stool burden without evidence of enteric obstruction   11/13/2016 Imaging   TVUS was performed on 11/13/16 which showed a large hypoechoic lobular mass seen posterior to the uterus measuring 18.2x16.1x11.8cm, blood flow was seen along the periphery of the mass. It was considered to be likely to be a fibroid vs pelvic mass vs ovarian mass. Neither ovary was removed. Moderate amount of free fluid in the pelvis. THe uterus measures 4x10x4cm. The endometrium is 66m.    12/05/2016 Imaging   MR pelvis 1. Mild limitations as detailed above. 2. Heterogeneous pelvic mass is favored to arise from the right ovary. Favor a solid ovarian neoplasm such as fibroma/Brenner's tumor. Given lesion size and heterogeneous T2 signal out, complicating torsion cannot be excluded. 3. Moderate abdominopelvic ascites, without specific evidence of peritoneal metastasis. Consider further evaluation with contrast-enhanced abdominopelvic CT.    12/26/2016 Pathology Results   1. Uterus, ovaries and fallopian tubes - ADENOCARCINOMA INVOLVING RIGHT FALLOPIAN TUBE, RIGHT AND LEFT OVARIES, UTERINE SEROSA AND PELVIC MASS. - SEE ONCOLOGY TABLE AND COMMENT. - UTERINE CERVIX, ENDOMETRIUM, MYOMETRIUM AND LEFT FALLOPIAN TUBE FREE OF TUMOR. 2. Omentum, resection for tumor - ADENOCARCINOMA. Microscopic Comment 1. ONCOLOGY TABLE - FALLOPIAN TUBE. SEE COMMENT 1. Specimen, including laterality: Uterus, bilateral adnexa, pelvic mass and omentum. 2. Procedure: Hysterectomy with bilateral salpingo-oophorectomy, pelvic mass excision and omentectomy. 3. Lymph node sampling performed: No 4. Tumor  site: See comment. 5. Tumor location in fallopian tube: Right  fallopian tube fimbria 6. Specimen integrity (intact/ruptured/disrupted): Intact 7. Tumor size (cm): 18 cm, see comment. 8. Histologic type: Adenocarcinoma 9. Grade: High grade 10. Microscopic tumor extension: Tumor involves right fallopian tube, right and left ovaries, uterine serosa, pelvic mass and omentum. 11. Margins: See comment. 12. Lymph-Vascular invasion: Present 13. Lymph nodes: # examined: 0; # positive: N/A 14. TNM: pT3c, pNX 15. FIGO Stage (based on pathologic findings, needs clinical correlation: III-C 16. Comments: There is an 18 cm pelvic mass which is a high grade adenocarcinoma and there is a 12.2 cm  segment of omentum which is extensively involved with adenocarcinoma. The tumor also involves the fimbria of the right fallopian tube and the parenchyma of the right and left ovaries as well as uterine serosa. The fimbria of the right fallopian has intraepithelial atypia consistent with a precursor lesion and therefore this is most consistent with primary fallopian tube adenocarcinoma. A primary peritoneal serous carcinoma is a less likely possibility.    12/26/2016 Pathology Results   PERITONEAL/ASCITIC FLUID(SPECIMEN 1 OF 1 COLLECTED 12/26/16): POORLY DIFFERENTIATED ADENOCARCINOMA   12/26/2016 Surgery   Procedure(s) Performed: Exploratory laparotomy with total abdominal hysterectomy, bilateral salpingo-oophorectomy, omentectomy radical tumor debulking for ovarian cancer .  Surgeon: Thereasa Solo, MD.   Operative Findings: 20cm left ovarian mass densely adherent to the posterior uterus, right tube and ovary, cervix, sigmoid colon. 10cm omental cake. 4L ascites. 73m size tumor nodules on the serosa of the terminal ileum and proximal sigmoid colon. 157mnodules on right diaphragm.    This represented an optimal cytoreduction (R1) with 50m47modules on intestine and diaphragm representing gross visible disease   01/16/2017 Procedure   Placement of single lumen port a cath via right  internal jugular vein. The catheter tip lies at the cavo-atrial junction. A power injectable port a cath was placed and is ready for immediate use.   01/17/2017 Imaging   1. 3.5 x 7.2 x 4.6 cm fluid collection along the vaginal cuff, posterior the bladder and extending into the right adnexal space. This lesion demonstrates rim enhancement. Imaging features could be related to a loculated postoperative seroma or hematoma. Superinfection cannot be excluded by CT. Given debulking surgery was 3 weeks ago, this entire structure is un likely to represent neoplasm, but peritoneal involvement could have this appearance. 2. Small fluid collection in the left para colic gutter without rim enhancement. 3. Irregular/nodular appearance of the peritoneal M in the anatomic pelvis with areas of subtle nodularity between the stomach and the spleen. Close attention in these regions on follow-up recommended as metastatic disease is a concern. 4. Mildly enlarged hepatoduodenal ligament lymph node. Attention on follow-up recommended.   01/20/2017 Tumor Marker   Patient's tumor was tested for the following markers: CA-125 Results of the tumor marker test revealed 46.6   01/28/2017 Genetic Testing       Negative genetic testing on the [facility].  The [facility] panel offered by [employer] sequencing and deletion/duplication testing of the following 28 genes: APC, ATM, BARD1, BMPR1A, BRCA1, BRCA2, BRIP1, CHD1, CDK4, CDKN2A, CHEK2, EPCAM (large rearrangement only), MLH1, MSH2, MSH6, MUTYH, NBN, PALB2, PMS2, PTEN, RAD51C, RAD51D, SMAD4, STK11, and TP53. Sequencing was performed for select regions of POLE and POLD1, and large rearrangement analysis was performed for select regions of GREM1. The report date is January 28, 2017.  HRD testing looking for genomic instability and BRCA mutations was negative.  The report date of this test is January 27, 2017.    01/31/2017 Tumor Marker    Patient's tumor was tested for the following markers: CA-125 Results of the tumor marker test revealed 22.4   03/04/2017 Tumor Marker   Patient's tumor was tested for the following markers: CA-125 Results of the tumor marker test revealed 13.8   04/18/2017 Tumor Marker   Patient's tumor was tested for the following markers: CA-125 Results of the tumor marker test revealed 11.9   05/05/2017 Tumor Marker   Patient's tumor was tested for the following markers: CA-125 Results of the tumor marker test revealed 12   05/26/2017 Tumor Marker   Patient's tumor was tested for the following markers: CA-125 Results of the tumor marker test revealed 10.6   05/26/2017 Imaging   1. A previously noted postoperative fluid collection in the low pelvis and residual ascites seen on the prior examination has resolved on today's study. No findings to suggest residual/recurrent disease on today's examination. No definite solid organ metastasis identified in the abdomen or pelvis. No lymphadenopathy. 2. Aortic atherosclerosis.   06/10/2017 Procedure   Successful right IJ vein Port-A-Cath explant.   03/09/2018 Tumor Marker   Patient's tumor was tested for the following markers: CA-125 Results of the tumor marker test revealed 12.1   05/26/2018 Tumor Marker   Patient's tumor was tested for the following markers: CA-125 Results of the tumor marker test revealed 13.8   09/09/2018 Tumor Marker   Patient's tumor was tested for the following markers: CA-125 Results of the tumor marker test revealed 23.9   12/18/2018 Tumor Marker   Patient's tumor was tested for the following markers: CA-125 Results of the tumor marker test revealed 43.8   02/27/2019 - 02/28/2019 Hospital Admission   She was admitted for bowel obstruction   02/27/2019 Imaging   1. High-grade small bowel obstruction with transition point in the pelvis in an area of suspected desmoplastic reaction surrounding a 1.3 cm spiculated nodule in the  mesentery, suspicious for carcinoid. There is hyperenhancement near the tip of the appendix and loss of the normal fat plane between it and the adjacent sigmoid colon, potentially the site of primary lesion. 2. Multiple new small subcentimeter hyperdense lesions scattered along the liver capsule, concerning for metastases. Enlarged hyperenhancing gastrohepatic and portacaval lymph nodes concerning for nodal metastases. 3. Very mild right hydroureteronephrosis to the level of the desmoplastic reaction in the pelvis, potentially involving the right ureter. 4. Infraumbilical ventral abdominal wall diastasis containing a small nondilated portion of transverse colon. 5. Trace ascites. 6. Cholelithiasis. 7.  Aortic atherosclerosis (ICD10-I70.0).     03/05/2019 Tumor Marker   Patient's tumor was tested for the following markers: CA-125 Results of the tumor marker test revealed 70.1   03/12/2019 Echocardiogram   IMPRESSIONS     1. Left ventricular ejection fraction, by visual estimation, is 60 to 65%. The left ventricle has normal function. There is no left ventricular hypertrophy.  2. Left ventricular diastolic parameters are consistent with Grade I diastolic dysfunction (impaired relaxation).  3. Global right ventricle has normal systolic function.The right ventricular size is normal. No increase in right ventricular wall thickness.  4. Left atrial size was normal.  5. Right atrial size was normal.  6. The pericardium was not assessed.  7. The mitral valve is normal in structure. No evidence of mitral valve regurgitation.  8. The tricuspid valve is normal in structure. Tricuspid valve regurgitation is trivial.  9. The aortic valve is normal  in structure. Aortic valve regurgitation is not visualized. 10. The pulmonic valve was grossly normal. Pulmonic valve regurgitation is not visualized. 11. The average left ventricular global longitudinal strain is -17.6 %.   03/16/2019 Procedure    Successful placement of a right internal jugular approach power injectable Port-A-Cath. The catheter is ready for immediate use.     04/19/2019 Tumor Marker   Patient's tumor was tested for the following markers: CA-125 Results of the tumor marker test revealed 39.8   05/17/2019 Tumor Marker   Patient's tumor was tested for the following markers: CA-125 Results of the tumor marker test revealed 27   05/28/2019 Tumor Marker   Patient's tumor was tested for the following markers: CA-125 Results of the tumor marker test revealed 27.7.   06/11/2019 Imaging   1. No new or progressive metastatic disease in the abdomen or pelvis. 2. Tiny noncalcified perisplenic implant is decreased. Calcified pericardiophrenic, retroperitoneal and bilateral inguinal lymph nodes and scattered small calcified perihepatic and perisplenic implants are stable. 3.  Aortic Atherosclerosis (ICD10-I70.0).       06/17/2019 Echocardiogram    1. Left ventricular ejection fraction, by estimation, is 65 to 70%. The left ventricle has normal function. The left ventricle has no regional wall motion abnormalities. Left ventricular diastolic parameters were normal.  2. Right ventricular systolic function is normal. The right ventricular size is normal.  3. The mitral valve is normal in structure and function. No evidence of mitral valve regurgitation. No evidence of mitral stenosis.  4. The aortic valve is normal in structure and function. Aortic valve regurgitation is not visualized. No aortic stenosis is present.   06/21/2019 Tumor Marker   Patient's tumor was tested for the following markers: CA-125 Results of the tumor marker test revealed 18.7   07/19/2019 Tumor Marker   Patient's tumor was tested for the following markers: CA-125 Results of the tumor marker test revealed 16.7   08/30/2019 Tumor Marker   Patient's tumor was tested for the following markers: CA-125. Results of the tumor marker test revealed 13.9    09/15/2019 Echocardiogram    1. Left ventricular ejection fraction, by estimation, is 65 to 70%. The left ventricle has normal function. The left ventricle has no regional wall motion abnormalities. Left ventricular diastolic parameters are consistent with Grade I diastolic dysfunction (impaired relaxation). The average left ventricular global longitudinal strain is 18.1 %. The global longitudinal strain is normal.  2. Right ventricular systolic function is normal. The right ventricular size is normal.  3. The mitral valve is normal in structure. No evidence of mitral valve regurgitation. No evidence of mitral stenosis.  4. The aortic valve is normal in structure. Aortic valve regurgitation is not visualized. No aortic stenosis is present.  5. The inferior vena cava is normal in size with greater than 50% respiratory variability, suggesting right atrial pressure of 3 mmHg.     09/24/2019 Imaging   1. Stable exam. No new or progressive metastatic disease on today's study. 2. Small subcapsular hypodensity in the lateral spleen, new in the interval, but indeterminate. Attention on follow-up recommended. 3. The calcified upper abdominal and groin lymph nodes are stable as are the scattered calcified and noncalcified peritoneal implants. 4. Stable midline ventral hernia containing a short segment of colon without complicating features. 5. Aortic Atherosclerosis (ICD10-I70.0).     10/04/2019 -  Chemotherapy   The patient had bevacizumab-bvzr (ZIRABEV) 1,200 mg in sodium chloride 0.9 % 100 mL chemo infusion, 15 mg/kg = 1,200 mg, Intravenous,  Once, 5 of 7 cycles Administration: 1,200 mg (10/04/2019), 1,200 mg (11/22/2019), 1,200 mg (12/13/2019), 1,200 mg (11/01/2019)  for chemotherapy treatment.    10/04/2019 Tumor Marker   Patient's tumor was tested for the following markers: CA-125. Results of the tumor marker test revealed 12.9.   11/01/2019 Tumor Marker   Patient's tumor was tested for the following  markers: CA-125 Results of the tumor marker test revealed 15.4   12/13/2019 Tumor Marker   Patient's tumor was tested for the following markers: CA-125 Results of the tumor marker test revealed 14.8   12/31/2019 Imaging   1. Unchanged tiny peritoneal nodule adjacent to the spleen measuring 6 mm. Other previously noted peritoneal nodules are imperceptible on present examination. 2. Unchanged prominent, partially calcified portacaval lymph node and bilateral inguinal lymph nodes. 3. Unchanged subtle, nonspecific subcapsular lesion of the spleen.  4. No evidence of new metastatic disease in the abdomen or pelvis. 5. Status post hysterectomy. 6. Unchanged low midline ventral abdominal hernia containing a single loop of nonobstructed transverse colon. 7. Cholelithiasis. 8. Aortic Atherosclerosis (ICD10-I70.0).         REVIEW OF SYSTEMS:   Constitutional: Denies fevers, chills or abnormal weight loss Eyes: Denies blurriness of vision Ears, nose, mouth, throat, and face: Denies mucositis or sore throat Respiratory: Denies cough, dyspnea or wheezes Cardiovascular: Denies palpitation, chest discomfort or lower extremity swelling Gastrointestinal:  Denies nausea, heartburn or change in bowel habits Skin: Denies abnormal skin rashes Lymphatics: Denies new lymphadenopathy or easy bruising Neurological:Denies numbness, tingling or new weaknesses Behavioral/Psych: Mood is stable, no new changes  All other systems were reviewed with the patient and are negative.  I have reviewed the past medical history, past surgical history, social history and family history with the patient and they are unchanged from previous note.  ALLERGIES:  is allergic to dilaudid [hydromorphone hcl], morphine and related, and sudafed [pseudoephedrine hcl].  MEDICATIONS:  Current Outpatient Medications  Medication Sig Dispense Refill  . acetaminophen (TYLENOL) 325 MG tablet Take 975 mg by mouth every 6 (six) hours as  needed (pain).    Marland Kitchen amLODipine (NORVASC) 10 MG tablet Take 1 tablet (10 mg total) by mouth daily. 30 tablet 9  . amoxicillin (AMOXIL) 500 MG capsule Take 1 capsule (500 mg total) by mouth 2 (two) times daily. 10 capsule 0  . HYDROcodone-acetaminophen (NORCO/VICODIN) 5-325 MG tablet Take 1 tablet by mouth every 6 (six) hours as needed.    . lidocaine-prilocaine (EMLA) cream APP EXT AA 1 TIME    . magic mouthwash w/lidocaine SOLN Take 5 mLs by mouth 4 (four) times daily. Take 5 ml's 4 x daily for 7 days. Swish and spit. 140 mL 0  . methylPREDNISolone (MEDROL DOSEPAK) 4 MG TBPK tablet Take 4 mg by mouth See admin instructions. follow package directions    . ondansetron (ZOFRAN) 8 MG tablet Take 1 tablet (8 mg total) by mouth every 8 (eight) hours as needed. 30 tablet 1  . prochlorperazine (COMPAZINE) 10 MG tablet TAKE 1 TABLET BY MOUTH EVERY 6 HOURS AS NEEDED FOR NAUSEA AND/OR VOMITING 30 tablet 1  . promethazine (PHENERGAN) 25 MG suppository Place 1 suppository (25 mg total) rectally 2 (two) times daily as needed for nausea or vomiting. 12 each 0  . traMADol (ULTRAM) 50 MG tablet Take 50 mg by mouth 3 (three) times daily as needed.    . venlafaxine XR (EFFEXOR-XR) 37.5 MG 24 hr capsule TAKE 1 CAPSULE BY MOUTH DAILY WITH BREAKFAST (Patient taking differently: Take  37.5 mg by mouth daily with breakfast. ) 90 capsule 6   No current facility-administered medications for this visit.   Facility-Administered Medications Ordered in Other Visits  Medication Dose Route Frequency Provider Last Rate Last Admin  . bevacizumab-bvzr (ZIRABEV) 1,200 mg in sodium chloride 0.9 % 100 mL chemo infusion  15 mg/kg (Treatment Plan Recorded) Intravenous Once Alvy Bimler, Seaton Hofmann, MD      . heparin lock flush 100 unit/mL  500 Units Intracatheter Once PRN Alvy Bimler, Jonahtan Manseau, MD      . sodium chloride flush (NS) 0.9 % injection 10 mL  10 mL Intravenous PRN Alvy Bimler, Dyann Goodspeed, MD   10 mL at 02/11/17 0839  . sodium chloride flush (NS) 0.9 %  injection 10 mL  10 mL Intravenous PRN Alvy Bimler, Sarya Linenberger, MD   10 mL at 03/04/17 1510  . sodium chloride flush (NS) 0.9 % injection 10 mL  10 mL Intracatheter PRN Alvy Bimler, Stevin Bielinski, MD        PHYSICAL EXAMINATION: ECOG PERFORMANCE STATUS: 0 - Asymptomatic  Vitals:   01/03/20 1105  BP: 136/76  Pulse: 70  Resp: 18  Temp: (!) 97.5 F (36.4 C)  SpO2: 100%   Filed Weights   01/03/20 1105  Weight: 167 lb 9.6 oz (76 kg)    GENERAL:alert, no distress and comfortable SKIN: skin color, texture, turgor are normal, no rashes or significant lesions EYES: normal, Conjunctiva are pink and non-injected, sclera clear OROPHARYNX:no exudate, no erythema and lips, buccal mucosa, and tongue normal  NECK: supple, thyroid normal size, non-tender, without nodularity LYMPH:  no palpable lymphadenopathy in the cervical, axillary or inguinal LUNGS: clear to auscultation and percussion with normal breathing effort HEART: regular rate & rhythm and no murmurs and no lower extremity edema ABDOMEN:abdomen soft, non-tender and normal bowel sounds Musculoskeletal:no cyanosis of digits and no clubbing  NEURO: alert & oriented x 3 with fluent speech, no focal motor/sensory deficits  LABORATORY DATA:  I have reviewed the data as listed    Component Value Date/Time   NA 139 01/03/2020 1054   NA 139 04/14/2017 0742   K 4.2 01/03/2020 1054   K 4.0 04/14/2017 0742   CL 106 01/03/2020 1054   CO2 27 01/03/2020 1054   CO2 21 (L) 04/14/2017 0742   GLUCOSE 91 01/03/2020 1054   GLUCOSE 247 (H) 04/14/2017 0742   BUN 23 01/03/2020 1054   BUN 13.1 04/14/2017 0742   CREATININE 1.06 (H) 01/03/2020 1054   CREATININE 0.87 07/19/2019 0951   CREATININE 0.9 04/14/2017 0742   CALCIUM 9.5 01/03/2020 1054   CALCIUM 9.2 04/14/2017 0742   PROT 7.4 01/03/2020 1054   PROT 7.3 04/14/2017 0742   ALBUMIN 3.8 01/03/2020 1054   ALBUMIN 3.9 04/14/2017 0742   AST 17 01/03/2020 1054   AST 21 07/19/2019 0951   AST 17 04/14/2017 0742   ALT  11 01/03/2020 1054   ALT 13 07/19/2019 0951   ALT 14 04/14/2017 0742   ALKPHOS 81 01/03/2020 1054   ALKPHOS 70 04/14/2017 0742   BILITOT 0.4 01/03/2020 1054   BILITOT 0.3 07/19/2019 0951   BILITOT 0.37 04/14/2017 0742   GFRNONAA 55 (L) 01/03/2020 1054   GFRNONAA >60 07/19/2019 0951   GFRAA >60 01/03/2020 1054   GFRAA >60 07/19/2019 0951    No results found for: SPEP, UPEP  Lab Results  Component Value Date   WBC 5.1 01/03/2020   NEUTROABS 2.9 01/03/2020   HGB 12.6 01/03/2020   HCT 37.7 01/03/2020   MCV 94.0 01/03/2020  PLT 149 (L) 01/03/2020      Chemistry      Component Value Date/Time   NA 139 01/03/2020 1054   NA 139 04/14/2017 0742   K 4.2 01/03/2020 1054   K 4.0 04/14/2017 0742   CL 106 01/03/2020 1054   CO2 27 01/03/2020 1054   CO2 21 (L) 04/14/2017 0742   BUN 23 01/03/2020 1054   BUN 13.1 04/14/2017 0742   CREATININE 1.06 (H) 01/03/2020 1054   CREATININE 0.87 07/19/2019 0951   CREATININE 0.9 04/14/2017 0742      Component Value Date/Time   CALCIUM 9.5 01/03/2020 1054   CALCIUM 9.2 04/14/2017 0742   ALKPHOS 81 01/03/2020 1054   ALKPHOS 70 04/14/2017 0742   AST 17 01/03/2020 1054   AST 21 07/19/2019 0951   AST 17 04/14/2017 0742   ALT 11 01/03/2020 1054   ALT 13 07/19/2019 0951   ALT 14 04/14/2017 0742   BILITOT 0.4 01/03/2020 1054   BILITOT 0.3 07/19/2019 0951   BILITOT 0.37 04/14/2017 0742       RADIOGRAPHIC STUDIES: I have personally reviewed the radiological images as listed and agreed with the findings in the report. CT ABDOMEN PELVIS W CONTRAST  Result Date: 12/31/2019 CLINICAL DATA:  Ovarian cancer, assess treatment response EXAM: CT ABDOMEN AND PELVIS WITH CONTRAST TECHNIQUE: Multidetector CT imaging of the abdomen and pelvis was performed using the standard protocol following bolus administration of intravenous contrast. CONTRAST:  170m OMNIPAQUE IOHEXOL 300 MG/ML SOLN, additional oral enteric contrast COMPARISON:  09/24/2019,  06/11/2019, 02/27/2019 FINDINGS: Lower chest: No acute abnormality. Hepatobiliary: No solid liver abnormality is seen. Faintly rim calcified gallstones in the gallbladder. No gallbladder wall thickening, or biliary dilatation. Pancreas: Unremarkable. No pancreatic ductal dilatation or surrounding inflammatory changes. Spleen: Normal in size. Unchanged very subtle hypodensity of the subcapsular peripheral spleen measuring approximately 6 mm (series 2, image 23). Adrenals/Urinary Tract: Adrenal glands are unremarkable. Kidneys are normal, without renal calculi, solid lesion, or hydronephrosis. Bladder is unremarkable. Stomach/Bowel: Surgical clips in the vicinity of the hiatus. Small hiatal hernia. Stomach is otherwise within normal limits. Appendix appears normal. No evidence of bowel wall thickening, distention, or inflammatory changes. Vascular/Lymphatic: Aortic atherosclerosis. Unchanged prominent, partially calcified portacaval lymph node measuring 1.2 x 0.9 cm (series 2, image 23). Unchanged prominent bilateral inguinal lymph nodes measuring up to 1.3 x 1.1 cm on the left (series 2, image 73). Reproductive: Status post hysterectomy. Other: Unchanged low midline ventral abdominal hernia containing a single loop of nonobstructed transverse colon (series 2, image 55). No abdominopelvic ascites. Unchanged tiny peritoneal nodule adjacent to the spleen measuring 6 mm (series 2, image 24). Other previously noted peritoneal nodules are imperceptible on present examination. Musculoskeletal: No acute or significant osseous findings. IMPRESSION: 1. Unchanged tiny peritoneal nodule adjacent to the spleen measuring 6 mm. Other previously noted peritoneal nodules are imperceptible on present examination. 2. Unchanged prominent, partially calcified portacaval lymph node and bilateral inguinal lymph nodes. 3. Unchanged subtle, nonspecific subcapsular lesion of the spleen. 4. No evidence of new metastatic disease in the abdomen  or pelvis. 5. Status post hysterectomy. 6. Unchanged low midline ventral abdominal hernia containing a single loop of nonobstructed transverse colon. 7. Cholelithiasis. 8. Aortic Atherosclerosis (ICD10-I70.0). Electronically Signed   By: AEddie CandleM.D.   On: 12/31/2019 09:03

## 2020-01-04 LAB — CA 125: Cancer Antigen (CA) 125: 11.7 U/mL (ref 0.0–38.1)

## 2020-01-24 ENCOUNTER — Inpatient Hospital Stay: Payer: BC Managed Care – PPO

## 2020-01-24 ENCOUNTER — Other Ambulatory Visit: Payer: Self-pay

## 2020-01-24 ENCOUNTER — Inpatient Hospital Stay: Payer: BC Managed Care – PPO | Attending: Hematology and Oncology

## 2020-01-24 VITALS — BP 112/61 | HR 65 | Temp 98.2°F | Resp 16

## 2020-01-24 DIAGNOSIS — Z79899 Other long term (current) drug therapy: Secondary | ICD-10-CM | POA: Diagnosis not present

## 2020-01-24 DIAGNOSIS — Z7189 Other specified counseling: Secondary | ICD-10-CM

## 2020-01-24 DIAGNOSIS — C5701 Malignant neoplasm of right fallopian tube: Secondary | ICD-10-CM | POA: Diagnosis not present

## 2020-01-24 DIAGNOSIS — Z5112 Encounter for antineoplastic immunotherapy: Secondary | ICD-10-CM | POA: Insufficient documentation

## 2020-01-24 DIAGNOSIS — C569 Malignant neoplasm of unspecified ovary: Secondary | ICD-10-CM

## 2020-01-24 DIAGNOSIS — C786 Secondary malignant neoplasm of retroperitoneum and peritoneum: Secondary | ICD-10-CM | POA: Diagnosis not present

## 2020-01-24 DIAGNOSIS — C562 Malignant neoplasm of left ovary: Secondary | ICD-10-CM

## 2020-01-24 LAB — CBC WITH DIFFERENTIAL (CANCER CENTER ONLY)
Abs Immature Granulocytes: 0.03 10*3/uL (ref 0.00–0.07)
Basophils Absolute: 0.1 10*3/uL (ref 0.0–0.1)
Basophils Relative: 1 %
Eosinophils Absolute: 0.3 10*3/uL (ref 0.0–0.5)
Eosinophils Relative: 6 %
HCT: 38.8 % (ref 36.0–46.0)
Hemoglobin: 12.8 g/dL (ref 12.0–15.0)
Immature Granulocytes: 1 %
Lymphocytes Relative: 35 %
Lymphs Abs: 1.7 10*3/uL (ref 0.7–4.0)
MCH: 30.8 pg (ref 26.0–34.0)
MCHC: 33 g/dL (ref 30.0–36.0)
MCV: 93.5 fL (ref 80.0–100.0)
Monocytes Absolute: 0.3 10*3/uL (ref 0.1–1.0)
Monocytes Relative: 7 %
Neutro Abs: 2.5 10*3/uL (ref 1.7–7.7)
Neutrophils Relative %: 50 %
Platelet Count: 154 10*3/uL (ref 150–400)
RBC: 4.15 MIL/uL (ref 3.87–5.11)
RDW: 12.7 % (ref 11.5–15.5)
WBC Count: 4.8 10*3/uL (ref 4.0–10.5)
nRBC: 0 % (ref 0.0–0.2)

## 2020-01-24 LAB — COMPREHENSIVE METABOLIC PANEL
ALT: 12 U/L (ref 0–44)
AST: 16 U/L (ref 15–41)
Albumin: 3.6 g/dL (ref 3.5–5.0)
Alkaline Phosphatase: 73 U/L (ref 38–126)
Anion gap: 8 (ref 5–15)
BUN: 18 mg/dL (ref 8–23)
CO2: 25 mmol/L (ref 22–32)
Calcium: 9.3 mg/dL (ref 8.9–10.3)
Chloride: 105 mmol/L (ref 98–111)
Creatinine, Ser: 1.05 mg/dL — ABNORMAL HIGH (ref 0.44–1.00)
GFR calc Af Amer: >60 mL/min (ref >60)
GFR calc non Af Amer: 56 mL/min — ABNORMAL LOW (ref >60)
Glucose, Bld: 153 mg/dL — ABNORMAL HIGH (ref 70–99)
Potassium: 4.1 mmol/L (ref 3.5–5.1)
Sodium: 138 mmol/L (ref 135–145)
Total Bilirubin: 0.3 mg/dL (ref 0.3–1.2)
Total Protein: 7 g/dL (ref 6.5–8.1)

## 2020-01-24 LAB — TOTAL PROTEIN, URINE DIPSTICK: Protein, ur: NEGATIVE mg/dL

## 2020-01-24 MED ORDER — HEPARIN SOD (PORK) LOCK FLUSH 100 UNIT/ML IV SOLN
500.0000 [IU] | Freq: Once | INTRAVENOUS | Status: AC | PRN
  Administered 2020-01-24: 500 [IU]
  Filled 2020-01-24: qty 5

## 2020-01-24 MED ORDER — SODIUM CHLORIDE 0.9 % IV SOLN
15.0000 mg/kg | Freq: Once | INTRAVENOUS | Status: AC
  Administered 2020-01-24: 1200 mg via INTRAVENOUS
  Filled 2020-01-24: qty 48

## 2020-01-24 MED ORDER — SODIUM CHLORIDE 0.9 % IV SOLN
Freq: Once | INTRAVENOUS | Status: AC
  Filled 2020-01-24: qty 250

## 2020-01-24 MED ORDER — SODIUM CHLORIDE 0.9% FLUSH
10.0000 mL | Freq: Once | INTRAVENOUS | Status: AC
  Administered 2020-01-24: 10 mL
  Filled 2020-01-24: qty 10

## 2020-01-24 MED ORDER — SODIUM CHLORIDE 0.9% FLUSH
10.0000 mL | INTRAVENOUS | Status: DC | PRN
  Administered 2020-01-24: 10 mL
  Filled 2020-01-24: qty 10

## 2020-01-24 NOTE — Patient Instructions (Signed)
[facility] Discharge Instructions for Patients Receiving Chemotherapy  Today you received the following immunotherapy agent: Bevacizumab (Avastin)  To help prevent nausea and vomiting after your treatment, we encourage you to take your nausea medication as directed by your MD.   If you develop nausea and vomiting that is not controlled by your nausea medication, call the clinic.   BELOW ARE SYMPTOMS THAT SHOULD BE REPORTED IMMEDIATELY:  *FEVER GREATER THAN 100.5 F  *CHILLS WITH OR WITHOUT FEVER  NAUSEA AND VOMITING THAT IS NOT CONTROLLED WITH YOUR NAUSEA MEDICATION  *UNUSUAL SHORTNESS OF BREATH  *UNUSUAL BRUISING OR BLEEDING  TENDERNESS IN MOUTH AND THROAT WITH OR WITHOUT PRESENCE OF ULCERS  *URINARY PROBLEMS  *BOWEL PROBLEMS  UNUSUAL RASH Items with * indicate a potential emergency and should be followed up as soon as possible.  Feel free to call the clinic should you have any questions or concerns. The clinic phone number is [phone].  Please show the [facility] at check-in to the Emergency Department and triage nurse.

## 2020-01-25 LAB — CA 125: Cancer Antigen (CA) 125: 15.1 U/mL (ref 0.0–38.1)

## 2020-02-14 ENCOUNTER — Inpatient Hospital Stay: Payer: BC Managed Care – PPO

## 2020-02-14 ENCOUNTER — Other Ambulatory Visit: Payer: Self-pay

## 2020-02-14 ENCOUNTER — Telehealth: Payer: Self-pay | Admitting: Hematology and Oncology

## 2020-02-14 ENCOUNTER — Encounter: Payer: Self-pay | Admitting: Hematology and Oncology

## 2020-02-14 ENCOUNTER — Inpatient Hospital Stay (HOSPITAL_BASED_OUTPATIENT_CLINIC_OR_DEPARTMENT_OTHER): Payer: BC Managed Care – PPO | Admitting: Hematology and Oncology

## 2020-02-14 DIAGNOSIS — C569 Malignant neoplasm of unspecified ovary: Secondary | ICD-10-CM

## 2020-02-14 DIAGNOSIS — C5701 Malignant neoplasm of right fallopian tube: Secondary | ICD-10-CM

## 2020-02-14 DIAGNOSIS — I10 Essential (primary) hypertension: Secondary | ICD-10-CM

## 2020-02-14 DIAGNOSIS — C786 Secondary malignant neoplasm of retroperitoneum and peritoneum: Secondary | ICD-10-CM | POA: Diagnosis not present

## 2020-02-14 DIAGNOSIS — R5381 Other malaise: Secondary | ICD-10-CM

## 2020-02-14 DIAGNOSIS — Z5112 Encounter for antineoplastic immunotherapy: Secondary | ICD-10-CM | POA: Diagnosis not present

## 2020-02-14 DIAGNOSIS — Z7189 Other specified counseling: Secondary | ICD-10-CM

## 2020-02-14 DIAGNOSIS — Z79899 Other long term (current) drug therapy: Secondary | ICD-10-CM | POA: Diagnosis not present

## 2020-02-14 DIAGNOSIS — C562 Malignant neoplasm of left ovary: Secondary | ICD-10-CM

## 2020-02-14 LAB — CBC WITH DIFFERENTIAL (CANCER CENTER ONLY)
Abs Immature Granulocytes: 0.01 10*3/uL (ref 0.00–0.07)
Basophils Absolute: 0 10*3/uL (ref 0.0–0.1)
Basophils Relative: 1 %
Eosinophils Absolute: 0.2 10*3/uL (ref 0.0–0.5)
Eosinophils Relative: 5 %
HCT: 37.8 % (ref 36.0–46.0)
Hemoglobin: 12.7 g/dL (ref 12.0–15.0)
Immature Granulocytes: 0 %
Lymphocytes Relative: 33 %
Lymphs Abs: 1.6 10*3/uL (ref 0.7–4.0)
MCH: 31.5 pg (ref 26.0–34.0)
MCHC: 33.6 g/dL (ref 30.0–36.0)
MCV: 93.8 fL (ref 80.0–100.0)
Monocytes Absolute: 0.4 10*3/uL (ref 0.1–1.0)
Monocytes Relative: 9 %
Neutro Abs: 2.5 10*3/uL (ref 1.7–7.7)
Neutrophils Relative %: 52 %
Platelet Count: 165 10*3/uL (ref 150–400)
RBC: 4.03 MIL/uL (ref 3.87–5.11)
RDW: 13 % (ref 11.5–15.5)
WBC Count: 4.8 10*3/uL (ref 4.0–10.5)
nRBC: 0 % (ref 0.0–0.2)

## 2020-02-14 LAB — COMPREHENSIVE METABOLIC PANEL
ALT: 11 U/L (ref 0–44)
AST: 15 U/L (ref 15–41)
Albumin: 3.8 g/dL (ref 3.5–5.0)
Alkaline Phosphatase: 71 U/L (ref 38–126)
Anion gap: 8 (ref 5–15)
BUN: 16 mg/dL (ref 8–23)
CO2: 25 mmol/L (ref 22–32)
Calcium: 9.3 mg/dL (ref 8.9–10.3)
Chloride: 107 mmol/L (ref 98–111)
Creatinine, Ser: 0.98 mg/dL (ref 0.44–1.00)
GFR, Estimated: >60 mL/min (ref >60)
Glucose, Bld: 115 mg/dL — ABNORMAL HIGH (ref 70–99)
Potassium: 4 mmol/L (ref 3.5–5.1)
Sodium: 140 mmol/L (ref 135–145)
Total Bilirubin: 0.5 mg/dL (ref 0.3–1.2)
Total Protein: 6.9 g/dL (ref 6.5–8.1)

## 2020-02-14 LAB — TOTAL PROTEIN, URINE DIPSTICK: Protein, ur: NEGATIVE mg/dL

## 2020-02-14 MED ORDER — SODIUM CHLORIDE 0.9 % IV SOLN
15.0000 mg/kg | Freq: Once | INTRAVENOUS | Status: AC
  Administered 2020-02-14: 1200 mg via INTRAVENOUS
  Filled 2020-02-14: qty 48

## 2020-02-14 MED ORDER — SODIUM CHLORIDE 0.9 % IV SOLN
Freq: Once | INTRAVENOUS | Status: AC
  Filled 2020-02-14: qty 250

## 2020-02-14 MED ORDER — SODIUM CHLORIDE 0.9% FLUSH
10.0000 mL | INTRAVENOUS | Status: DC | PRN
  Administered 2020-02-14: 10 mL
  Filled 2020-02-14: qty 10

## 2020-02-14 MED ORDER — HEPARIN SOD (PORK) LOCK FLUSH 100 UNIT/ML IV SOLN
500.0000 [IU] | Freq: Once | INTRAVENOUS | Status: AC | PRN
  Administered 2020-02-14: 500 [IU]
  Filled 2020-02-14: qty 5

## 2020-02-14 MED ORDER — SODIUM CHLORIDE 0.9% FLUSH
10.0000 mL | Freq: Once | INTRAVENOUS | Status: AC
  Administered 2020-02-14: 10 mL
  Filled 2020-02-14: qty 10

## 2020-02-14 NOTE — Assessment & Plan Note (Signed)
The patient requested extension of her disability parking permit because she has shortness of breath on exertion, less than 200 feet I will renew her disability parking permit next month

## 2020-02-14 NOTE — Assessment & Plan Note (Signed)
Her blood pressure is stable Observe only for now

## 2020-02-14 NOTE — Patient Instructions (Signed)
[facility] Discharge Instructions for Patients Receiving Chemotherapy  Today you received the following chemotherapy agents: bevacizumab  To help prevent nausea and vomiting after your treatment, we encourage you to take your nausea medication as directed.   If you develop nausea and vomiting that is not controlled by your nausea medication, call the clinic.   BELOW ARE SYMPTOMS THAT SHOULD BE REPORTED IMMEDIATELY:  *FEVER GREATER THAN 100.5 F  *CHILLS WITH OR WITHOUT FEVER  NAUSEA AND VOMITING THAT IS NOT CONTROLLED WITH YOUR NAUSEA MEDICATION  *UNUSUAL SHORTNESS OF BREATH  *UNUSUAL BRUISING OR BLEEDING  TENDERNESS IN MOUTH AND THROAT WITH OR WITHOUT PRESENCE OF ULCERS  *URINARY PROBLEMS  *BOWEL PROBLEMS  UNUSUAL RASH Items with * indicate a potential emergency and should be followed up as soon as possible.  Feel free to call the clinic should you have any questions or concerns. The clinic phone number is [phone].  Please show the CHEMO ALERT CARD at check-in to the Emergency Department and triage nurse.   

## 2020-02-14 NOTE — Progress Notes (Signed)
[place] OFFICE PROGRESS NOTE  Patient Care Team: Gaynelle Arabian, MD as PCP - General (Family Medicine) Heath Lark, MD as Consulting Physician (Hematology and Oncology) Everitt Amber, MD as Consulting Physician (Obstetrics and Gynecology)  ASSESSMENT & PLAN:  Adenocarcinoma of right fallopian tube [facility]) So far, she tolerated bevacizumab well Her blood pressure is satisfactory She will continue treatment every 3 weeks as scheduled Due to stability of her imaging study, I will space out interval between imaging study Her next imaging would be around March 2022  Essential hypertension Her blood pressure is stable Observe only for now  Physical debility The patient requested extension of her disability parking permit because she has shortness of breath on exertion, less than 200 feet I will renew her disability parking permit next month   No orders of the defined types were placed in this encounter.   All questions were answered. The patient knows to call the clinic with any problems, questions or concerns. The total time spent in the appointment was 20 minutes encounter with patients including review of chart and various tests results, discussions about plan of care and coordination of care plan   Heath Lark, MD 02/14/2020 10:05 AM  INTERVAL HISTORY: Please see below for problem oriented charting. She returns with her husband for further follow-up She denies new signs or symptoms to suggest cancer recurrence Denies abdominal pain, bloating or changes in bowel habits She has not been vigilant about blood pressure monitoring at home 2 readings she had over the last few months showed that her blood pressure is within normal limits No recent infection, fever or chills  SUMMARY OF ONCOLOGIC HISTORY: Oncology History Overview Note  Neg genetics   Adenocarcinoma of right fallopian tube ([place])  08/12/2016 Initial Diagnosis   The patient has many months of vague  symptoms of fullness in the pelvis, urinary frequency and difficulty with defecation. She denies vaginal bleeding or rectal bleeding.    08/12/2016 Imaging   Abdomen X-ray in the ER: Moderate colonic stool burden without evidence of enteric obstruction   11/13/2016 Imaging   TVUS was performed on 11/13/16 which showed a large hypoechoic lobular mass seen posterior to the uterus measuring 18.2x16.1x11.8cm, blood flow was seen along the periphery of the mass. It was considered to be likely to be a fibroid vs pelvic mass vs ovarian mass. Neither ovary was removed. Moderate amount of free fluid in the pelvis. THe uterus measures 4x10x4cm. The endometrium is 32mm.    12/05/2016 Imaging   MR pelvis 1. Mild limitations as detailed above. 2. Heterogeneous pelvic mass is favored to arise from the right ovary. Favor a solid ovarian neoplasm such as fibroma/Brenner's tumor. Given lesion size and heterogeneous T2 signal out, complicating torsion cannot be excluded. 3. Moderate abdominopelvic ascites, without specific evidence of peritoneal metastasis. Consider further evaluation with contrast-enhanced abdominopelvic CT.    12/26/2016 Pathology Results   1. Uterus, ovaries and fallopian tubes - ADENOCARCINOMA INVOLVING RIGHT FALLOPIAN TUBE, RIGHT AND LEFT OVARIES, UTERINE SEROSA AND PELVIC MASS. - SEE ONCOLOGY TABLE AND COMMENT. - UTERINE CERVIX, ENDOMETRIUM, MYOMETRIUM AND LEFT FALLOPIAN TUBE FREE OF TUMOR. 2. Omentum, resection for tumor - ADENOCARCINOMA. Microscopic Comment 1. ONCOLOGY TABLE - FALLOPIAN TUBE. SEE COMMENT 1. Specimen, including laterality: Uterus, bilateral adnexa, pelvic mass and omentum. 2. Procedure: Hysterectomy with bilateral salpingo-oophorectomy, pelvic mass excision and omentectomy. 3. Lymph node sampling performed: No 4. Tumor site: See comment. 5. Tumor location in fallopian tube: Right fallopian tube fimbria 6. Specimen  integrity (intact/ruptured/disrupted): Intact 7.  Tumor size (cm): 18 cm, see comment. 8. Histologic type: Adenocarcinoma 9. Grade: High grade 10. Microscopic tumor extension: Tumor involves right fallopian tube, right and left ovaries, uterine serosa, pelvic mass and omentum. 11. Margins: See comment. 12. Lymph-Vascular invasion: Present 13. Lymph nodes: # examined: 0; # positive: N/A 14. TNM: pT3c, pNX 15. FIGO Stage (based on pathologic findings, needs clinical correlation: III-C 16. Comments: There is an 18 cm pelvic mass which is a high grade adenocarcinoma and there is a 12.2 cm  segment of omentum which is extensively involved with adenocarcinoma. The tumor also involves the fimbria of the right fallopian tube and the parenchyma of the right and left ovaries as well as uterine serosa. The fimbria of the right fallopian has intraepithelial atypia consistent with a precursor lesion and therefore this is most consistent with primary fallopian tube adenocarcinoma. A primary peritoneal serous carcinoma is a less likely possibility.    12/26/2016 Pathology Results   PERITONEAL/ASCITIC FLUID(SPECIMEN 1 OF 1 COLLECTED 12/26/16): POORLY DIFFERENTIATED ADENOCARCINOMA   12/26/2016 Surgery   Procedure(s) Performed: Exploratory laparotomy with total abdominal hysterectomy, bilateral salpingo-oophorectomy, omentectomy radical tumor debulking for ovarian cancer .  Surgeon: Thereasa Solo, MD.   Operative Findings: 20cm left ovarian mass densely adherent to the posterior uterus, right tube and ovary, cervix, sigmoid colon. 10cm omental cake. 4L ascites. 2m size tumor nodules on the serosa of the terminal ileum and proximal sigmoid colon. 135mnodules on right diaphragm.    This represented an optimal cytoreduction (R1) with 17m22modules on intestine and diaphragm representing gross visible disease   01/16/2017 Procedure   Placement of single lumen port a cath via right internal jugular vein. The catheter tip lies at the cavo-atrial junction. A power  injectable port a cath was placed and is ready for immediate use.   01/17/2017 Imaging   1. 3.5 x 7.2 x 4.6 cm fluid collection along the vaginal cuff, posterior the bladder and extending into the right adnexal space. This lesion demonstrates rim enhancement. Imaging features could be related to a loculated postoperative seroma or hematoma. Superinfection cannot be excluded by CT. Given debulking surgery was 3 weeks ago, this entire structure is un likely to represent neoplasm, but peritoneal involvement could have this appearance. 2. Small fluid collection in the left para colic gutter without rim enhancement. 3. Irregular/nodular appearance of the peritoneal M in the anatomic pelvis with areas of subtle nodularity between the stomach and the spleen. Close attention in these regions on follow-up recommended as metastatic disease is a concern. 4. Mildly enlarged hepatoduodenal ligament lymph node. Attention on follow-up recommended.   01/20/2017 Tumor Marker   Patient's tumor was tested for the following markers: CA-125 Results of the tumor marker test revealed 46.6   01/28/2017 Genetic Testing       Negative genetic testing on the [facility].  The [facility] panel offered by [employer] sequencing and deletion/duplication testing of the following 28 genes: APC, ATM, BARD1, BMPR1A, BRCA1, BRCA2, BRIP1, CHD1, CDK4, CDKN2A, CHEK2, EPCAM (large rearrangement only), MLH1, MSH2, MSH6, MUTYH, NBN, PALB2, PMS2, PTEN, RAD51C, RAD51D, SMAD4, STK11, and TP53. Sequencing was performed for select regions of POLE and POLD1, and large rearrangement analysis was performed for select regions of GREM1. The report date is January 28, 2017.  HRD testing looking for genomic instability and BRCA mutations was negative.  The report date of this test is January 27, 2017.    01/31/2017 Tumor  Marker   Patient's tumor was tested for the following markers: CA-125 Results of the tumor marker  test revealed 22.4   03/04/2017 Tumor Marker   Patient's tumor was tested for the following markers: CA-125 Results of the tumor marker test revealed 13.8   04/18/2017 Tumor Marker   Patient's tumor was tested for the following markers: CA-125 Results of the tumor marker test revealed 11.9   05/05/2017 Tumor Marker   Patient's tumor was tested for the following markers: CA-125 Results of the tumor marker test revealed 12   05/26/2017 Tumor Marker   Patient's tumor was tested for the following markers: CA-125 Results of the tumor marker test revealed 10.6   05/26/2017 Imaging   1. A previously noted postoperative fluid collection in the low pelvis and residual ascites seen on the prior examination has resolved on today's study. No findings to suggest residual/recurrent disease on today's examination. No definite solid organ metastasis identified in the abdomen or pelvis. No lymphadenopathy. 2. Aortic atherosclerosis.   06/10/2017 Procedure   Successful right IJ vein Port-A-Cath explant.   03/09/2018 Tumor Marker   Patient's tumor was tested for the following markers: CA-125 Results of the tumor marker test revealed 12.1   05/26/2018 Tumor Marker   Patient's tumor was tested for the following markers: CA-125 Results of the tumor marker test revealed 13.8   09/09/2018 Tumor Marker   Patient's tumor was tested for the following markers: CA-125 Results of the tumor marker test revealed 23.9   12/18/2018 Tumor Marker   Patient's tumor was tested for the following markers: CA-125 Results of the tumor marker test revealed 43.8   02/27/2019 - 02/28/2019 Hospital Admission   She was admitted for bowel obstruction   02/27/2019 Imaging   1. High-grade small bowel obstruction with transition point in the pelvis in an area of suspected desmoplastic reaction surrounding a 1.3 cm spiculated nodule in the mesentery, suspicious for carcinoid. There is hyperenhancement near the tip of the appendix and  loss of the normal fat plane between it and the adjacent sigmoid colon, potentially the site of primary lesion. 2. Multiple new small subcentimeter hyperdense lesions scattered along the liver capsule, concerning for metastases. Enlarged hyperenhancing gastrohepatic and portacaval lymph nodes concerning for nodal metastases. 3. Very mild right hydroureteronephrosis to the level of the desmoplastic reaction in the pelvis, potentially involving the right ureter. 4. Infraumbilical ventral abdominal wall diastasis containing a small nondilated portion of transverse colon. 5. Trace ascites. 6. Cholelithiasis. 7.  Aortic atherosclerosis (ICD10-I70.0).     03/05/2019 Tumor Marker   Patient's tumor was tested for the following markers: CA-125 Results of the tumor marker test revealed 70.1   03/12/2019 Echocardiogram   IMPRESSIONS     1. Left ventricular ejection fraction, by visual estimation, is 60 to 65%. The left ventricle has normal function. There is no left ventricular hypertrophy.  2. Left ventricular diastolic parameters are consistent with Grade I diastolic dysfunction (impaired relaxation).  3. Global right ventricle has normal systolic function.The right ventricular size is normal. No increase in right ventricular wall thickness.  4. Left atrial size was normal.  5. Right atrial size was normal.  6. The pericardium was not assessed.  7. The mitral valve is normal in structure. No evidence of mitral valve regurgitation.  8. The tricuspid valve is normal in structure. Tricuspid valve regurgitation is trivial.  9. The aortic valve is normal in structure. Aortic valve regurgitation is not visualized. 10. The pulmonic valve was grossly normal.  Pulmonic valve regurgitation is not visualized. 11. The average left ventricular global longitudinal strain is -17.6 %.   03/16/2019 Procedure   Successful placement of a right internal jugular approach power injectable Port-A-Cath. The catheter is  ready for immediate use.     04/19/2019 Tumor Marker   Patient's tumor was tested for the following markers: CA-125 Results of the tumor marker test revealed 39.8   05/17/2019 Tumor Marker   Patient's tumor was tested for the following markers: CA-125 Results of the tumor marker test revealed 27   05/28/2019 Tumor Marker   Patient's tumor was tested for the following markers: CA-125 Results of the tumor marker test revealed 27.7.   06/11/2019 Imaging   1. No new or progressive metastatic disease in the abdomen or pelvis. 2. Tiny noncalcified perisplenic implant is decreased. Calcified pericardiophrenic, retroperitoneal and bilateral inguinal lymph nodes and scattered small calcified perihepatic and perisplenic implants are stable. 3.  Aortic Atherosclerosis (ICD10-I70.0).       06/17/2019 Echocardiogram    1. Left ventricular ejection fraction, by estimation, is 65 to 70%. The left ventricle has normal function. The left ventricle has no regional wall motion abnormalities. Left ventricular diastolic parameters were normal.  2. Right ventricular systolic function is normal. The right ventricular size is normal.  3. The mitral valve is normal in structure and function. No evidence of mitral valve regurgitation. No evidence of mitral stenosis.  4. The aortic valve is normal in structure and function. Aortic valve regurgitation is not visualized. No aortic stenosis is present.   06/21/2019 Tumor Marker   Patient's tumor was tested for the following markers: CA-125 Results of the tumor marker test revealed 18.7   07/19/2019 Tumor Marker   Patient's tumor was tested for the following markers: CA-125 Results of the tumor marker test revealed 16.7   08/30/2019 Tumor Marker   Patient's tumor was tested for the following markers: CA-125. Results of the tumor marker test revealed 13.9   09/15/2019 Echocardiogram    1. Left ventricular ejection fraction, by estimation, is 65 to 70%. The left  ventricle has normal function. The left ventricle has no regional wall motion abnormalities. Left ventricular diastolic parameters are consistent with Grade I diastolic dysfunction (impaired relaxation). The average left ventricular global longitudinal strain is 18.1 %. The global longitudinal strain is normal.  2. Right ventricular systolic function is normal. The right ventricular size is normal.  3. The mitral valve is normal in structure. No evidence of mitral valve regurgitation. No evidence of mitral stenosis.  4. The aortic valve is normal in structure. Aortic valve regurgitation is not visualized. No aortic stenosis is present.  5. The inferior vena cava is normal in size with greater than 50% respiratory variability, suggesting right atrial pressure of 3 mmHg.     09/24/2019 Imaging   1. Stable exam. No new or progressive metastatic disease on today's study. 2. Small subcapsular hypodensity in the lateral spleen, new in the interval, but indeterminate. Attention on follow-up recommended. 3. The calcified upper abdominal and groin lymph nodes are stable as are the scattered calcified and noncalcified peritoneal implants. 4. Stable midline ventral hernia containing a short segment of colon without complicating features. 5. Aortic Atherosclerosis (ICD10-I70.0).     10/04/2019 -  Chemotherapy   The patient had bevacizumab-bvzr (ZIRABEV) 1,200 mg in sodium chloride 0.9 % 100 mL chemo infusion, 15 mg/kg = 1,200 mg, Intravenous,  Once, 7 of 10 cycles Administration: 1,200 mg (10/04/2019), 1,200 mg (11/22/2019), 1,200 mg (  12/13/2019), 1,200 mg (11/01/2019), 1,200 mg (01/03/2020), 1,200 mg (01/24/2020), 1,200 mg (02/14/2020)  for chemotherapy treatment.    10/04/2019 Tumor Marker   Patient's tumor was tested for the following markers: CA-125. Results of the tumor marker test revealed 12.9.   11/01/2019 Tumor Marker   Patient's tumor was tested for the following markers: CA-125 Results of the tumor  marker test revealed 15.4   12/13/2019 Tumor Marker   Patient's tumor was tested for the following markers: CA-125 Results of the tumor marker test revealed 14.8   12/31/2019 Imaging   1. Unchanged tiny peritoneal nodule adjacent to the spleen measuring 6 mm. Other previously noted peritoneal nodules are imperceptible on present examination. 2. Unchanged prominent, partially calcified portacaval lymph node and bilateral inguinal lymph nodes. 3. Unchanged subtle, nonspecific subcapsular lesion of the spleen.  4. No evidence of new metastatic disease in the abdomen or pelvis. 5. Status post hysterectomy. 6. Unchanged low midline ventral abdominal hernia containing a single loop of nonobstructed transverse colon. 7. Cholelithiasis. 8. Aortic Atherosclerosis (ICD10-I70.0).       01/03/2020 Tumor Marker   Patient's tumor was tested for the following markers: CA-125 Results of the tumor marker test revealed 11.7.   01/24/2020 Tumor Marker   Patient's tumor was tested for the following markers: CA-125. Results of the tumor marker test revealed 15.1     REVIEW OF SYSTEMS:   Constitutional: Denies fevers, chills or abnormal weight loss Eyes: Denies blurriness of vision Ears, nose, mouth, throat, and face: Denies mucositis or sore throat Respiratory: Denies cough, dyspnea or wheezes Cardiovascular: Denies palpitation, chest discomfort or lower extremity swelling Gastrointestinal:  Denies nausea, heartburn or change in bowel habits Skin: Denies abnormal skin rashes Lymphatics: Denies new lymphadenopathy or easy bruising Neurological:Denies numbness, tingling or new weaknesses Behavioral/Psych: Mood is stable, no new changes  All other systems were reviewed with the patient and are negative.  I have reviewed the past medical history, past surgical history, social history and family history with the patient and they are unchanged from previous note.  ALLERGIES:  is allergic to dilaudid  [hydromorphone hcl], morphine and related, and sudafed [pseudoephedrine hcl].  MEDICATIONS:  Current Outpatient Medications  Medication Sig Dispense Refill  . acetaminophen (TYLENOL) 325 MG tablet Take 975 mg by mouth every 6 (six) hours as needed (pain).    Marland Kitchen amLODipine (NORVASC) 10 MG tablet Take 1 tablet (10 mg total) by mouth daily. 30 tablet 9  . lidocaine-prilocaine (EMLA) cream APP EXT AA 1 TIME    . ondansetron (ZOFRAN) 8 MG tablet Take 1 tablet (8 mg total) by mouth every 8 (eight) hours as needed. 30 tablet 1  . prochlorperazine (COMPAZINE) 10 MG tablet TAKE 1 TABLET BY MOUTH EVERY 6 HOURS AS NEEDED FOR NAUSEA AND/OR VOMITING 30 tablet 1  . venlafaxine XR (EFFEXOR-XR) 37.5 MG 24 hr capsule TAKE 1 CAPSULE BY MOUTH DAILY WITH BREAKFAST (Patient taking differently: Take 37.5 mg by mouth daily with breakfast. ) 90 capsule 6   No current facility-administered medications for this visit.   Facility-Administered Medications Ordered in Other Visits  Medication Dose Route Frequency Provider Last Rate Last Admin  . sodium chloride flush (NS) 0.9 % injection 10 mL  10 mL Intravenous PRN Alvy Bimler, Deyon Chizek, MD   10 mL at 02/11/17 0839  . sodium chloride flush (NS) 0.9 % injection 10 mL  10 mL Intravenous PRN Alvy Bimler, Krystyn Picking, MD   10 mL at 03/04/17 1510  . sodium chloride flush (NS) 0.9 %  injection 10 mL  10 mL Intracatheter PRN Heath Lark, MD   10 mL at 02/14/20 0946    PHYSICAL EXAMINATION: ECOG PERFORMANCE STATUS: 1 - Symptomatic but completely ambulatory  Vitals:   02/14/20 0831  BP: (!) 145/69  Pulse: 66  Resp: 18  Temp: 97.9 F (36.6 C)  SpO2: 100%   Filed Weights   02/14/20 0831  Weight: 168 lb 3.2 oz (76.3 kg)    GENERAL:alert, no distress and comfortable SKIN: skin color, texture, turgor are normal, no rashes or significant lesions EYES: normal, Conjunctiva are pink and non-injected, sclera clear OROPHARYNX:no exudate, no erythema and lips, buccal mucosa, and tongue normal   NECK: supple, thyroid normal size, non-tender, without nodularity LYMPH:  no palpable lymphadenopathy in the cervical, axillary or inguinal LUNGS: clear to auscultation and percussion with normal breathing effort HEART: regular rate & rhythm and no murmurs and no lower extremity edema ABDOMEN:abdomen soft, non-tender and normal bowel sounds Musculoskeletal:no cyanosis of digits and no clubbing  NEURO: alert & oriented x 3 with fluent speech, no focal motor/sensory deficits  LABORATORY DATA:  I have reviewed the data as listed    Component Value Date/Time   NA 140 02/14/2020 0810   NA 139 04/14/2017 0742   K 4.0 02/14/2020 0810   K 4.0 04/14/2017 0742   CL 107 02/14/2020 0810   CO2 25 02/14/2020 0810   CO2 21 (L) 04/14/2017 0742   GLUCOSE 115 (H) 02/14/2020 0810   GLUCOSE 247 (H) 04/14/2017 0742   BUN 16 02/14/2020 0810   BUN 13.1 04/14/2017 0742   CREATININE 0.98 02/14/2020 0810   CREATININE 0.87 07/19/2019 0951   CREATININE 0.9 04/14/2017 0742   CALCIUM 9.3 02/14/2020 0810   CALCIUM 9.2 04/14/2017 0742   PROT 6.9 02/14/2020 0810   PROT 7.3 04/14/2017 0742   ALBUMIN 3.8 02/14/2020 0810   ALBUMIN 3.9 04/14/2017 0742   AST 15 02/14/2020 0810   AST 21 07/19/2019 0951   AST 17 04/14/2017 0742   ALT 11 02/14/2020 0810   ALT 13 07/19/2019 0951   ALT 14 04/14/2017 0742   ALKPHOS 71 02/14/2020 0810   ALKPHOS 70 04/14/2017 0742   BILITOT 0.5 02/14/2020 0810   BILITOT 0.3 07/19/2019 0951   BILITOT 0.37 04/14/2017 0742   GFRNONAA >60 02/14/2020 0810   GFRNONAA >60 07/19/2019 0951   GFRAA >60 01/24/2020 0924   GFRAA >60 07/19/2019 0951    No results found for: SPEP, UPEP  Lab Results  Component Value Date   WBC 4.8 02/14/2020   NEUTROABS 2.5 02/14/2020   HGB 12.7 02/14/2020   HCT 37.8 02/14/2020   MCV 93.8 02/14/2020   PLT 165 02/14/2020      Chemistry      Component Value Date/Time   NA 140 02/14/2020 0810   NA 139 04/14/2017 0742   K 4.0 02/14/2020 0810   K  4.0 04/14/2017 0742   CL 107 02/14/2020 0810   CO2 25 02/14/2020 0810   CO2 21 (L) 04/14/2017 0742   BUN 16 02/14/2020 0810   BUN 13.1 04/14/2017 0742   CREATININE 0.98 02/14/2020 0810   CREATININE 0.87 07/19/2019 0951   CREATININE 0.9 04/14/2017 0742      Component Value Date/Time   CALCIUM 9.3 02/14/2020 0810   CALCIUM 9.2 04/14/2017 0742   ALKPHOS 71 02/14/2020 0810   ALKPHOS 70 04/14/2017 0742   AST 15 02/14/2020 0810   AST 21 07/19/2019 0951   AST 17 04/14/2017 0742  ALT 11 02/14/2020 0810   ALT 13 07/19/2019 0951   ALT 14 04/14/2017 0742   BILITOT 0.5 02/14/2020 0810   BILITOT 0.3 07/19/2019 0951   BILITOT 0.37 04/14/2017 0742

## 2020-02-14 NOTE — Telephone Encounter (Signed)
Scheduled appointment per 10/25 sch msg. Spoke to patient who is aware of appointments dates and times.

## 2020-02-14 NOTE — Assessment & Plan Note (Signed)
So far, she tolerated bevacizumab well Her blood pressure is satisfactory She will continue treatment every 3 weeks as scheduled Due to stability of her imaging study, I will space out interval between imaging study Her next imaging would be around March 2022

## 2020-03-06 ENCOUNTER — Other Ambulatory Visit: Payer: Self-pay | Admitting: Hematology and Oncology

## 2020-03-06 ENCOUNTER — Inpatient Hospital Stay: Payer: BC Managed Care – PPO

## 2020-03-06 ENCOUNTER — Other Ambulatory Visit: Payer: Self-pay

## 2020-03-06 ENCOUNTER — Inpatient Hospital Stay: Payer: BC Managed Care – PPO | Attending: Hematology and Oncology

## 2020-03-06 VITALS — BP 145/79 | HR 76 | Temp 98.1°F | Resp 18 | Wt 171.0 lb

## 2020-03-06 DIAGNOSIS — C5701 Malignant neoplasm of right fallopian tube: Secondary | ICD-10-CM

## 2020-03-06 DIAGNOSIS — Z7189 Other specified counseling: Secondary | ICD-10-CM

## 2020-03-06 DIAGNOSIS — C569 Malignant neoplasm of unspecified ovary: Secondary | ICD-10-CM

## 2020-03-06 DIAGNOSIS — Z5112 Encounter for antineoplastic immunotherapy: Secondary | ICD-10-CM | POA: Diagnosis not present

## 2020-03-06 DIAGNOSIS — C562 Malignant neoplasm of left ovary: Secondary | ICD-10-CM

## 2020-03-06 DIAGNOSIS — Z79899 Other long term (current) drug therapy: Secondary | ICD-10-CM | POA: Insufficient documentation

## 2020-03-06 LAB — CBC WITH DIFFERENTIAL (CANCER CENTER ONLY)
Abs Immature Granulocytes: 0.03 10*3/uL (ref 0.00–0.07)
Basophils Absolute: 0.1 10*3/uL (ref 0.0–0.1)
Basophils Relative: 1 %
Eosinophils Absolute: 0.3 10*3/uL (ref 0.0–0.5)
Eosinophils Relative: 5 %
HCT: 37.5 % (ref 36.0–46.0)
Hemoglobin: 12.4 g/dL (ref 12.0–15.0)
Immature Granulocytes: 1 %
Lymphocytes Relative: 30 %
Lymphs Abs: 1.6 10*3/uL (ref 0.7–4.0)
MCH: 31.1 pg (ref 26.0–34.0)
MCHC: 33.1 g/dL (ref 30.0–36.0)
MCV: 94 fL (ref 80.0–100.0)
Monocytes Absolute: 0.4 10*3/uL (ref 0.1–1.0)
Monocytes Relative: 8 %
Neutro Abs: 3 10*3/uL (ref 1.7–7.7)
Neutrophils Relative %: 55 %
Platelet Count: 164 10*3/uL (ref 150–400)
RBC: 3.99 MIL/uL (ref 3.87–5.11)
RDW: 13.2 % (ref 11.5–15.5)
WBC Count: 5.3 10*3/uL (ref 4.0–10.5)
nRBC: 0 % (ref 0.0–0.2)

## 2020-03-06 LAB — COMPREHENSIVE METABOLIC PANEL
ALT: 10 U/L (ref 0–44)
AST: 15 U/L (ref 15–41)
Albumin: 3.6 g/dL (ref 3.5–5.0)
Alkaline Phosphatase: 70 U/L (ref 38–126)
Anion gap: 7 (ref 5–15)
BUN: 17 mg/dL (ref 8–23)
CO2: 25 mmol/L (ref 22–32)
Calcium: 9 mg/dL (ref 8.9–10.3)
Chloride: 106 mmol/L (ref 98–111)
Creatinine, Ser: 0.99 mg/dL (ref 0.44–1.00)
GFR, Estimated: >60 mL/min (ref >60)
Glucose, Bld: 120 mg/dL — ABNORMAL HIGH (ref 70–99)
Potassium: 4.4 mmol/L (ref 3.5–5.1)
Sodium: 138 mmol/L (ref 135–145)
Total Bilirubin: 0.6 mg/dL (ref 0.3–1.2)
Total Protein: 6.8 g/dL (ref 6.5–8.1)

## 2020-03-06 LAB — TOTAL PROTEIN, URINE DIPSTICK: Protein, ur: NEGATIVE mg/dL

## 2020-03-06 MED ORDER — SODIUM CHLORIDE 0.9 % IV SOLN
15.0000 mg/kg | Freq: Once | INTRAVENOUS | Status: AC
  Administered 2020-03-06: 1200 mg via INTRAVENOUS
  Filled 2020-03-06: qty 48

## 2020-03-06 MED ORDER — SODIUM CHLORIDE 0.9% FLUSH
10.0000 mL | Freq: Once | INTRAVENOUS | Status: DC
  Filled 2020-03-06: qty 10

## 2020-03-06 MED ORDER — HEPARIN SOD (PORK) LOCK FLUSH 100 UNIT/ML IV SOLN
500.0000 [IU] | Freq: Once | INTRAVENOUS | Status: DC
  Filled 2020-03-06: qty 5

## 2020-03-06 MED ORDER — HEPARIN SOD (PORK) LOCK FLUSH 100 UNIT/ML IV SOLN
500.0000 [IU] | Freq: Once | INTRAVENOUS | Status: AC | PRN
  Administered 2020-03-06: 500 [IU]
  Filled 2020-03-06: qty 5

## 2020-03-06 MED ORDER — SODIUM CHLORIDE 0.9 % IV SOLN
Freq: Once | INTRAVENOUS | Status: AC
  Filled 2020-03-06: qty 250

## 2020-03-06 MED ORDER — PROCHLORPERAZINE MALEATE 10 MG PO TABS: ORAL_TABLET | ORAL | 1 refills | Status: DC

## 2020-03-06 MED ORDER — SODIUM CHLORIDE 0.9% FLUSH
10.0000 mL | INTRAVENOUS | Status: DC | PRN
  Administered 2020-03-06: 10 mL
  Filled 2020-03-06: qty 10

## 2020-03-06 MED FILL — ONDANSETRON HCL 8 MG TABLET: 8 | 10 days supply | Qty: 30 | Fill #1

## 2020-03-06 MED FILL — PROCHLORPERAZINE 10 MG TAB: 10 | 8 days supply | Qty: 30 | Fill #0

## 2020-03-06 NOTE — Patient Instructions (Signed)

## 2020-03-06 NOTE — Patient Instructions (Signed)
[facility] Discharge Instructions for Patients Receiving Chemotherapy  Today you received the following chemotherapy agents: bevacizumab  To help prevent nausea and vomiting after your treatment, we encourage you to take your nausea medication as directed.   If you develop nausea and vomiting that is not controlled by your nausea medication, call the clinic.   BELOW ARE SYMPTOMS THAT SHOULD BE REPORTED IMMEDIATELY:  *FEVER GREATER THAN 100.5 F  *CHILLS WITH OR WITHOUT FEVER  NAUSEA AND VOMITING THAT IS NOT CONTROLLED WITH YOUR NAUSEA MEDICATION  *UNUSUAL SHORTNESS OF BREATH  *UNUSUAL BRUISING OR BLEEDING  TENDERNESS IN MOUTH AND THROAT WITH OR WITHOUT PRESENCE OF ULCERS  *URINARY PROBLEMS  *BOWEL PROBLEMS  UNUSUAL RASH Items with * indicate a potential emergency and should be followed up as soon as possible.  Feel free to call the clinic should you have any questions or concerns. The clinic phone number is [phone].  Please show the CHEMO ALERT CARD at check-in to the Emergency Department and triage nurse.   

## 2020-03-07 LAB — CA 125: Cancer Antigen (CA) 125: 20.3 U/mL (ref 0.0–38.1)

## 2020-03-27 ENCOUNTER — Encounter: Payer: Self-pay | Admitting: Hematology and Oncology

## 2020-03-27 ENCOUNTER — Inpatient Hospital Stay: Payer: BC Managed Care – PPO

## 2020-03-27 ENCOUNTER — Other Ambulatory Visit: Payer: Self-pay

## 2020-03-27 ENCOUNTER — Inpatient Hospital Stay (HOSPITAL_BASED_OUTPATIENT_CLINIC_OR_DEPARTMENT_OTHER): Payer: BC Managed Care – PPO | Admitting: Hematology and Oncology

## 2020-03-27 ENCOUNTER — Telehealth: Payer: Self-pay | Admitting: Hematology and Oncology

## 2020-03-27 ENCOUNTER — Inpatient Hospital Stay: Payer: BC Managed Care – PPO | Attending: Hematology and Oncology

## 2020-03-27 VITALS — BP 142/71

## 2020-03-27 DIAGNOSIS — C5701 Malignant neoplasm of right fallopian tube: Secondary | ICD-10-CM | POA: Diagnosis not present

## 2020-03-27 DIAGNOSIS — C569 Malignant neoplasm of unspecified ovary: Secondary | ICD-10-CM

## 2020-03-27 DIAGNOSIS — I10 Essential (primary) hypertension: Secondary | ICD-10-CM

## 2020-03-27 DIAGNOSIS — Z7189 Other specified counseling: Secondary | ICD-10-CM

## 2020-03-27 DIAGNOSIS — Z79899 Other long term (current) drug therapy: Secondary | ICD-10-CM | POA: Insufficient documentation

## 2020-03-27 DIAGNOSIS — C562 Malignant neoplasm of left ovary: Secondary | ICD-10-CM

## 2020-03-27 DIAGNOSIS — R5381 Other malaise: Secondary | ICD-10-CM | POA: Diagnosis not present

## 2020-03-27 DIAGNOSIS — Z5112 Encounter for antineoplastic immunotherapy: Secondary | ICD-10-CM | POA: Diagnosis not present

## 2020-03-27 LAB — COMPREHENSIVE METABOLIC PANEL
ALT: 10 U/L (ref 0–44)
AST: 14 U/L — ABNORMAL LOW (ref 15–41)
Albumin: 3.5 g/dL (ref 3.5–5.0)
Alkaline Phosphatase: 68 U/L (ref 38–126)
Anion gap: 8 (ref 5–15)
BUN: 17 mg/dL (ref 8–23)
CO2: 27 mmol/L (ref 22–32)
Calcium: 9.4 mg/dL (ref 8.9–10.3)
Chloride: 105 mmol/L (ref 98–111)
Creatinine, Ser: 1.08 mg/dL — ABNORMAL HIGH (ref 0.44–1.00)
GFR, Estimated: 57 mL/min — ABNORMAL LOW (ref >60)
Glucose, Bld: 153 mg/dL — ABNORMAL HIGH (ref 70–99)
Potassium: 4.2 mmol/L (ref 3.5–5.1)
Sodium: 140 mmol/L (ref 135–145)
Total Bilirubin: 0.5 mg/dL (ref 0.3–1.2)
Total Protein: 6.8 g/dL (ref 6.5–8.1)

## 2020-03-27 LAB — CBC WITH DIFFERENTIAL (CANCER CENTER ONLY)
Abs Immature Granulocytes: 0.02 10*3/uL (ref 0.00–0.07)
Basophils Absolute: 0 10*3/uL (ref 0.0–0.1)
Basophils Relative: 1 %
Eosinophils Absolute: 0.2 10*3/uL (ref 0.0–0.5)
Eosinophils Relative: 4 %
HCT: 37.9 % (ref 36.0–46.0)
Hemoglobin: 12.6 g/dL (ref 12.0–15.0)
Immature Granulocytes: 0 %
Lymphocytes Relative: 32 %
Lymphs Abs: 1.6 10*3/uL (ref 0.7–4.0)
MCH: 30.8 pg (ref 26.0–34.0)
MCHC: 33.2 g/dL (ref 30.0–36.0)
MCV: 92.7 fL (ref 80.0–100.0)
Monocytes Absolute: 0.4 10*3/uL (ref 0.1–1.0)
Monocytes Relative: 8 %
Neutro Abs: 2.8 10*3/uL (ref 1.7–7.7)
Neutrophils Relative %: 55 %
Platelet Count: 173 10*3/uL (ref 150–400)
RBC: 4.09 MIL/uL (ref 3.87–5.11)
RDW: 13 % (ref 11.5–15.5)
WBC Count: 5.1 10*3/uL (ref 4.0–10.5)
nRBC: 0 % (ref 0.0–0.2)

## 2020-03-27 LAB — TOTAL PROTEIN, URINE DIPSTICK: Protein, ur: NEGATIVE mg/dL

## 2020-03-27 MED ORDER — SODIUM CHLORIDE 0.9% FLUSH
10.0000 mL | INTRAVENOUS | Status: DC | PRN
  Administered 2020-03-27: 10 mL
  Filled 2020-03-27: qty 10

## 2020-03-27 MED ORDER — SODIUM CHLORIDE 0.9 % IV SOLN
15.0000 mg/kg | Freq: Once | INTRAVENOUS | Status: AC
  Administered 2020-03-27: 1200 mg via INTRAVENOUS
  Filled 2020-03-27: qty 48

## 2020-03-27 MED ORDER — SODIUM CHLORIDE 0.9 % IV SOLN
Freq: Once | INTRAVENOUS | Status: AC
  Filled 2020-03-27: qty 250

## 2020-03-27 MED ORDER — SODIUM CHLORIDE 0.9% FLUSH
10.0000 mL | Freq: Once | INTRAVENOUS | Status: AC
  Administered 2020-03-27: 10 mL
  Filled 2020-03-27: qty 10

## 2020-03-27 MED ORDER — HEPARIN SOD (PORK) LOCK FLUSH 100 UNIT/ML IV SOLN
500.0000 [IU] | Freq: Once | INTRAVENOUS | Status: AC | PRN
  Administered 2020-03-27: 500 [IU]
  Filled 2020-03-27: qty 5

## 2020-03-27 NOTE — Assessment & Plan Note (Signed)
Her blood pressure is stable Observe only for now

## 2020-03-27 NOTE — Telephone Encounter (Signed)
Scheduled appointments per 12/6 sch msg. Spoke to patient who is aware of appointments dates and times.

## 2020-03-27 NOTE — Assessment & Plan Note (Signed)
I have renewed her disability parking permit recently

## 2020-03-27 NOTE — Progress Notes (Signed)
[place] OFFICE PROGRESS NOTE  Patient Care Team: Gaynelle Arabian, MD as PCP - General (Family Medicine) Heath Lark, MD as Consulting Physician (Hematology and Oncology) Everitt Amber, MD as Consulting Physician (Obstetrics and Gynecology)  ASSESSMENT & PLAN:  Adenocarcinoma of right fallopian tube [facility]) So far, she tolerated bevacizumab well Her blood pressure is satisfactory She will continue treatment every 3 weeks as scheduled Due to stability of her imaging study, I will space out interval between imaging study Her next imaging would be around March 2022  Essential hypertension Her blood pressure is stable Observe only for now  Physical debility I have renewed her disability parking permit recently   No orders of the defined types were placed in this encounter.   All questions were answered. The patient knows to call the clinic with any problems, questions or concerns. The total time spent in the appointment was 20 minutes encounter with patients including review of chart and various tests results, discussions about plan of care and coordination of care plan   Heath Lark, MD 03/27/2020 9:08 AM  INTERVAL HISTORY: Please see below for problem oriented charting. She returns for further follow-up with her husband She is doing well No recent infection, fever or chills Her documented blood pressure monitoring at home is satisfactory, majority of her blood pressure were within normal limits She denies recent leg swelling Denies abdominal pain or changes in bowel habits No new lymphadenopathy  SUMMARY OF ONCOLOGIC HISTORY: Oncology History Overview Note  Neg genetics   Adenocarcinoma of right fallopian tube ([place])  08/12/2016 Initial Diagnosis   The patient has many months of vague symptoms of fullness in the pelvis, urinary frequency and difficulty with defecation. She denies vaginal bleeding or rectal bleeding.    08/12/2016 Imaging   Abdomen X-ray in  the ER: Moderate colonic stool burden without evidence of enteric obstruction   11/13/2016 Imaging   TVUS was performed on 11/13/16 which showed a large hypoechoic lobular mass seen posterior to the uterus measuring 18.2x16.1x11.8cm, blood flow was seen along the periphery of the mass. It was considered to be likely to be a fibroid vs pelvic mass vs ovarian mass. Neither ovary was removed. Moderate amount of free fluid in the pelvis. THe uterus measures 4x10x4cm. The endometrium is 31m.    12/05/2016 Imaging   MR pelvis 1. Mild limitations as detailed above. 2. Heterogeneous pelvic mass is favored to arise from the right ovary. Favor a solid ovarian neoplasm such as fibroma/Brenner's tumor. Given lesion size and heterogeneous T2 signal out, complicating torsion cannot be excluded. 3. Moderate abdominopelvic ascites, without specific evidence of peritoneal metastasis. Consider further evaluation with contrast-enhanced abdominopelvic CT.    12/26/2016 Pathology Results   1. Uterus, ovaries and fallopian tubes - ADENOCARCINOMA INVOLVING RIGHT FALLOPIAN TUBE, RIGHT AND LEFT OVARIES, UTERINE SEROSA AND PELVIC MASS. - SEE ONCOLOGY TABLE AND COMMENT. - UTERINE CERVIX, ENDOMETRIUM, MYOMETRIUM AND LEFT FALLOPIAN TUBE FREE OF TUMOR. 2. Omentum, resection for tumor - ADENOCARCINOMA. Microscopic Comment 1. ONCOLOGY TABLE - FALLOPIAN TUBE. SEE COMMENT 1. Specimen, including laterality: Uterus, bilateral adnexa, pelvic mass and omentum. 2. Procedure: Hysterectomy with bilateral salpingo-oophorectomy, pelvic mass excision and omentectomy. 3. Lymph node sampling performed: No 4. Tumor site: See comment. 5. Tumor location in fallopian tube: Right fallopian tube fimbria 6. Specimen integrity (intact/ruptured/disrupted): Intact 7. Tumor size (cm): 18 cm, see comment. 8. Histologic type: Adenocarcinoma 9. Grade: High grade 10. Microscopic tumor extension: Tumor involves right fallopian tube, right and left  ovaries, uterine serosa, pelvic mass and omentum. 11. Margins: See comment. 12. Lymph-Vascular invasion: Present 13. Lymph nodes: # examined: 0; # positive: N/A 14. TNM: pT3c, pNX 15. FIGO Stage (based on pathologic findings, needs clinical correlation: III-C 16. Comments: There is an 18 cm pelvic mass which is a high grade adenocarcinoma and there is a 12.2 cm  segment of omentum which is extensively involved with adenocarcinoma. The tumor also involves the fimbria of the right fallopian tube and the parenchyma of the right and left ovaries as well as uterine serosa. The fimbria of the right fallopian has intraepithelial atypia consistent with a precursor lesion and therefore this is most consistent with primary fallopian tube adenocarcinoma. A primary peritoneal serous carcinoma is a less likely possibility.    12/26/2016 Pathology Results   PERITONEAL/ASCITIC FLUID(SPECIMEN 1 OF 1 COLLECTED 12/26/16): POORLY DIFFERENTIATED ADENOCARCINOMA   12/26/2016 Surgery   Procedure(s) Performed: Exploratory laparotomy with total abdominal hysterectomy, bilateral salpingo-oophorectomy, omentectomy radical tumor debulking for ovarian cancer .  Surgeon: Thereasa Solo, MD.   Operative Findings: 20cm left ovarian mass densely adherent to the posterior uterus, right tube and ovary, cervix, sigmoid colon. 10cm omental cake. 4L ascites. 40m size tumor nodules on the serosa of the terminal ileum and proximal sigmoid colon. 166mnodules on right diaphragm.    This represented an optimal cytoreduction (R1) with 60m40modules on intestine and diaphragm representing gross visible disease   01/16/2017 Procedure   Placement of single lumen port a cath via right internal jugular vein. The catheter tip lies at the cavo-atrial junction. A power injectable port a cath was placed and is ready for immediate use.   01/17/2017 Imaging   1. 3.5 x 7.2 x 4.6 cm fluid collection along the vaginal cuff, posterior the bladder and  extending into the right adnexal space. This lesion demonstrates rim enhancement. Imaging features could be related to a loculated postoperative seroma or hematoma. Superinfection cannot be excluded by CT. Given debulking surgery was 3 weeks ago, this entire structure is un likely to represent neoplasm, but peritoneal involvement could have this appearance. 2. Small fluid collection in the left para colic gutter without rim enhancement. 3. Irregular/nodular appearance of the peritoneal M in the anatomic pelvis with areas of subtle nodularity between the stomach and the spleen. Close attention in these regions on follow-up recommended as metastatic disease is a concern. 4. Mildly enlarged hepatoduodenal ligament lymph node. Attention on follow-up recommended.   01/20/2017 Tumor Marker   Patient's tumor was tested for the following markers: CA-125 Results of the tumor marker test revealed 46.6   01/28/2017 Genetic Testing       Negative genetic testing on the [facility] [facility]  The [facility] panel offered by [employer] sequencing and deletion/duplication testing of the following 28 genes: APC, ATM, BARD1, BMPR1A, BRCA1, BRCA2, BRIP1, CHD1, CDK4, CDKN2A, CHEK2, EPCAM (large rearrangement only), MLH1, MSH2, MSH6, MUTYH, NBN, PALB2, PMS2, PTEN, RAD51C, RAD51D, SMAD4, STK11, and TP53. Sequencing was performed for select regions of POLE and POLD1, and large rearrangement analysis was performed for select regions of GREM1. The report date is January 28, 2017.  HRD testing looking for genomic instability and BRCA mutations was negative.  The report date of this test is January 27, 2017.    01/31/2017 Tumor Marker   Patient's tumor was tested for the following markers: CA-125 Results of the tumor marker test revealed 22.4   03/04/2017 Tumor Marker   Patient's tumor was tested for  the following markers: CA-125 Results of the tumor marker test revealed 13.8   04/18/2017 Tumor  Marker   Patient's tumor was tested for the following markers: CA-125 Results of the tumor marker test revealed 11.9   05/05/2017 Tumor Marker   Patient's tumor was tested for the following markers: CA-125 Results of the tumor marker test revealed 12   05/26/2017 Tumor Marker   Patient's tumor was tested for the following markers: CA-125 Results of the tumor marker test revealed 10.6   05/26/2017 Imaging   1. A previously noted postoperative fluid collection in the low pelvis and residual ascites seen on the prior examination has resolved on today's study. No findings to suggest residual/recurrent disease on today's examination. No definite solid organ metastasis identified in the abdomen or pelvis. No lymphadenopathy. 2. Aortic atherosclerosis.   06/10/2017 Procedure   Successful right IJ vein Port-A-Cath explant.   03/09/2018 Tumor Marker   Patient's tumor was tested for the following markers: CA-125 Results of the tumor marker test revealed 12.1   05/26/2018 Tumor Marker   Patient's tumor was tested for the following markers: CA-125 Results of the tumor marker test revealed 13.8   09/09/2018 Tumor Marker   Patient's tumor was tested for the following markers: CA-125 Results of the tumor marker test revealed 23.9   12/18/2018 Tumor Marker   Patient's tumor was tested for the following markers: CA-125 Results of the tumor marker test revealed 43.8   02/27/2019 - 02/28/2019 Hospital Admission   She was admitted for bowel obstruction   02/27/2019 Imaging   1. High-grade small bowel obstruction with transition point in the pelvis in an area of suspected desmoplastic reaction surrounding a 1.3 cm spiculated nodule in the mesentery, suspicious for carcinoid. There is hyperenhancement near the tip of the appendix and loss of the normal fat plane between it and the adjacent sigmoid colon, potentially the site of primary lesion. 2. Multiple new small subcentimeter hyperdense lesions scattered  along the liver capsule, concerning for metastases. Enlarged hyperenhancing gastrohepatic and portacaval lymph nodes concerning for nodal metastases. 3. Very mild right hydroureteronephrosis to the level of the desmoplastic reaction in the pelvis, potentially involving the right ureter. 4. Infraumbilical ventral abdominal wall diastasis containing a small nondilated portion of transverse colon. 5. Trace ascites. 6. Cholelithiasis. 7.  Aortic atherosclerosis (ICD10-I70.0).     03/05/2019 Tumor Marker   Patient's tumor was tested for the following markers: CA-125 Results of the tumor marker test revealed 70.1   03/12/2019 Echocardiogram   IMPRESSIONS     1. Left ventricular ejection fraction, by visual estimation, is 60 to 65%. The left ventricle has normal function. There is no left ventricular hypertrophy.  2. Left ventricular diastolic parameters are consistent with Grade I diastolic dysfunction (impaired relaxation).  3. Global right ventricle has normal systolic function.The right ventricular size is normal. No increase in right ventricular wall thickness.  4. Left atrial size was normal.  5. Right atrial size was normal.  6. The pericardium was not assessed.  7. The mitral valve is normal in structure. No evidence of mitral valve regurgitation.  8. The tricuspid valve is normal in structure. Tricuspid valve regurgitation is trivial.  9. The aortic valve is normal in structure. Aortic valve regurgitation is not visualized. 10. The pulmonic valve was grossly normal. Pulmonic valve regurgitation is not visualized. 11. The average left ventricular global longitudinal strain is -17.6 %.   03/16/2019 Procedure   Successful placement of a right internal jugular approach power  injectable Port-A-Cath. The catheter is ready for immediate use.     04/19/2019 Tumor Marker   Patient's tumor was tested for the following markers: CA-125 Results of the tumor marker test revealed 39.8    05/17/2019 Tumor Marker   Patient's tumor was tested for the following markers: CA-125 Results of the tumor marker test revealed 27   05/28/2019 Tumor Marker   Patient's tumor was tested for the following markers: CA-125 Results of the tumor marker test revealed 27.7.   06/11/2019 Imaging   1. No new or progressive metastatic disease in the abdomen or pelvis. 2. Tiny noncalcified perisplenic implant is decreased. Calcified pericardiophrenic, retroperitoneal and bilateral inguinal lymph nodes and scattered small calcified perihepatic and perisplenic implants are stable. 3.  Aortic Atherosclerosis (ICD10-I70.0).       06/17/2019 Echocardiogram    1. Left ventricular ejection fraction, by estimation, is 65 to 70%. The left ventricle has normal function. The left ventricle has no regional wall motion abnormalities. Left ventricular diastolic parameters were normal.  2. Right ventricular systolic function is normal. The right ventricular size is normal.  3. The mitral valve is normal in structure and function. No evidence of mitral valve regurgitation. No evidence of mitral stenosis.  4. The aortic valve is normal in structure and function. Aortic valve regurgitation is not visualized. No aortic stenosis is present.   06/21/2019 Tumor Marker   Patient's tumor was tested for the following markers: CA-125 Results of the tumor marker test revealed 18.7   07/19/2019 Tumor Marker   Patient's tumor was tested for the following markers: CA-125 Results of the tumor marker test revealed 16.7   08/30/2019 Tumor Marker   Patient's tumor was tested for the following markers: CA-125. Results of the tumor marker test revealed 13.9   09/15/2019 Echocardiogram    1. Left ventricular ejection fraction, by estimation, is 65 to 70%. The left ventricle has normal function. The left ventricle has no regional wall motion abnormalities. Left ventricular diastolic parameters are consistent with Grade I diastolic  dysfunction (impaired relaxation). The average left ventricular global longitudinal strain is 18.1 %. The global longitudinal strain is normal.  2. Right ventricular systolic function is normal. The right ventricular size is normal.  3. The mitral valve is normal in structure. No evidence of mitral valve regurgitation. No evidence of mitral stenosis.  4. The aortic valve is normal in structure. Aortic valve regurgitation is not visualized. No aortic stenosis is present.  5. The inferior vena cava is normal in size with greater than 50% respiratory variability, suggesting right atrial pressure of 3 mmHg.     09/24/2019 Imaging   1. Stable exam. No new or progressive metastatic disease on today's study. 2. Small subcapsular hypodensity in the lateral spleen, new in the interval, but indeterminate. Attention on follow-up recommended. 3. The calcified upper abdominal and groin lymph nodes are stable as are the scattered calcified and noncalcified peritoneal implants. 4. Stable midline ventral hernia containing a short segment of colon without complicating features. 5. Aortic Atherosclerosis (ICD10-I70.0).     10/04/2019 -  Chemotherapy   The patient had bevacizumab-bvzr (ZIRABEV) 1,200 mg in sodium chloride 0.9 % 100 mL chemo infusion, 15 mg/kg = 1,200 mg, Intravenous,  Once, 8 of 12 cycles Administration: 1,200 mg (10/04/2019), 1,200 mg (11/22/2019), 1,200 mg (12/13/2019), 1,200 mg (11/01/2019), 1,200 mg (01/03/2020), 1,200 mg (01/24/2020), 1,200 mg (02/14/2020), 1,200 mg (03/06/2020)  for chemotherapy treatment.    10/04/2019 Tumor Marker   Patient's tumor was tested  for the following markers: CA-125. Results of the tumor marker test revealed 12.9.   11/01/2019 Tumor Marker   Patient's tumor was tested for the following markers: CA-125 Results of the tumor marker test revealed 15.4   12/13/2019 Tumor Marker   Patient's tumor was tested for the following markers: CA-125 Results of the tumor marker test  revealed 14.8   12/31/2019 Imaging   1. Unchanged tiny peritoneal nodule adjacent to the spleen measuring 6 mm. Other previously noted peritoneal nodules are imperceptible on present examination. 2. Unchanged prominent, partially calcified portacaval lymph node and bilateral inguinal lymph nodes. 3. Unchanged subtle, nonspecific subcapsular lesion of the spleen.  4. No evidence of new metastatic disease in the abdomen or pelvis. 5. Status post hysterectomy. 6. Unchanged low midline ventral abdominal hernia containing a single loop of nonobstructed transverse colon. 7. Cholelithiasis. 8. Aortic Atherosclerosis (ICD10-I70.0).       01/03/2020 Tumor Marker   Patient's tumor was tested for the following markers: CA-125 Results of the tumor marker test revealed 11.7.   01/24/2020 Tumor Marker   Patient's tumor was tested for the following markers: CA-125. Results of the tumor marker test revealed 15.1   03/06/2020 Tumor Marker   Patient's tumor was tested for the following markers: CA-125. Results of the tumor marker test revealed 20.3     REVIEW OF SYSTEMS:   Constitutional: Denies fevers, chills or abnormal weight loss Eyes: Denies blurriness of vision Ears, nose, mouth, throat, and face: Denies mucositis or sore throat Respiratory: Denies cough, dyspnea or wheezes Cardiovascular: Denies palpitation, chest discomfort or lower extremity swelling Gastrointestinal:  Denies nausea, heartburn or change in bowel habits Skin: Denies abnormal skin rashes Lymphatics: Denies new lymphadenopathy or easy bruising Neurological:Denies numbness, tingling or new weaknesses Behavioral/Psych: Mood is stable, no new changes  All other systems were reviewed with the patient and are negative.  I have reviewed the past medical history, past surgical history, social history and family history with the patient and they are unchanged from previous note.  ALLERGIES:  is allergic to dilaudid [hydromorphone  hcl], morphine and related, and sudafed [pseudoephedrine hcl].  MEDICATIONS:  Current Outpatient Medications  Medication Sig Dispense Refill  . acetaminophen (TYLENOL) 325 MG tablet Take 975 mg by mouth every 6 (six) hours as needed (pain).    Marland Kitchen amLODipine (NORVASC) 10 MG tablet Take 1 tablet (10 mg total) by mouth daily. 30 tablet 9  . lidocaine-prilocaine (EMLA) cream APP EXT AA 1 TIME    . ondansetron (ZOFRAN) 8 MG tablet Take 1 tablet (8 mg total) by mouth every 8 (eight) hours as needed. 30 tablet 1  . prochlorperazine (COMPAZINE) 10 MG tablet TAKE 1 TABLET BY MOUTH EVERY 6 HOURS AS NEEDED FOR NAUSEA AND/OR VOMITING 30 tablet 1  . venlafaxine XR (EFFEXOR-XR) 37.5 MG 24 hr capsule TAKE 1 CAPSULE BY MOUTH DAILY WITH BREAKFAST (Patient taking differently: Take 37.5 mg by mouth daily with breakfast. ) 90 capsule 6   No current facility-administered medications for this visit.   Facility-Administered Medications Ordered in Other Visits  Medication Dose Route Frequency Provider Last Rate Last Admin  . sodium chloride flush (NS) 0.9 % injection 10 mL  10 mL Intravenous PRN Alvy Bimler, Jairen Goldfarb, MD   10 mL at 02/11/17 0839  . sodium chloride flush (NS) 0.9 % injection 10 mL  10 mL Intravenous PRN Alvy Bimler, Viet Kemmerer, MD   10 mL at 03/04/17 1510    PHYSICAL EXAMINATION: ECOG PERFORMANCE STATUS: 1 - Symptomatic but completely  ambulatory  Vitals:   03/27/20 0859  BP: (!) 143/59  Pulse: 68  Resp: 17  Temp: 97.6 F (36.4 C)  SpO2: 100%   Filed Weights   03/27/20 0859  Weight: 171 lb 12.8 oz (77.9 kg)    GENERAL:alert, no distress and comfortable SKIN: skin color, texture, turgor are normal, no rashes or significant lesions EYES: normal, Conjunctiva are pink and non-injected, sclera clear OROPHARYNX:no exudate, no erythema and lips, buccal mucosa, and tongue normal  NECK: supple, thyroid normal size, non-tender, without nodularity LYMPH:  no palpable lymphadenopathy in the cervical, axillary or  inguinal LUNGS: clear to auscultation and percussion with normal breathing effort HEART: regular rate & rhythm and no murmurs and no lower extremity edema ABDOMEN:abdomen soft, non-tender and normal bowel sounds Musculoskeletal:no cyanosis of digits and no clubbing  NEURO: alert & oriented x 3 with fluent speech, no focal motor/sensory deficits  LABORATORY DATA:  I have reviewed the data as listed    Component Value Date/Time   NA 138 03/06/2020 0843   NA 139 04/14/2017 0742   K 4.4 03/06/2020 0843   K 4.0 04/14/2017 0742   CL 106 03/06/2020 0843   CO2 25 03/06/2020 0843   CO2 21 (L) 04/14/2017 0742   GLUCOSE 120 (H) 03/06/2020 0843   GLUCOSE 247 (H) 04/14/2017 0742   BUN 17 03/06/2020 0843   BUN 13.1 04/14/2017 0742   CREATININE 0.99 03/06/2020 0843   CREATININE 0.87 07/19/2019 0951   CREATININE 0.9 04/14/2017 0742   CALCIUM 9.0 03/06/2020 0843   CALCIUM 9.2 04/14/2017 0742   PROT 6.8 03/06/2020 0843   PROT 7.3 04/14/2017 0742   ALBUMIN 3.6 03/06/2020 0843   ALBUMIN 3.9 04/14/2017 0742   AST 15 03/06/2020 0843   AST 21 07/19/2019 0951   AST 17 04/14/2017 0742   ALT 10 03/06/2020 0843   ALT 13 07/19/2019 0951   ALT 14 04/14/2017 0742   ALKPHOS 70 03/06/2020 0843   ALKPHOS 70 04/14/2017 0742   BILITOT 0.6 03/06/2020 0843   BILITOT 0.3 07/19/2019 0951   BILITOT 0.37 04/14/2017 0742   GFRNONAA >60 03/06/2020 0843   GFRNONAA >60 07/19/2019 0951   GFRAA >60 01/24/2020 0924   GFRAA >60 07/19/2019 0951    No results found for: SPEP, UPEP  Lab Results  Component Value Date   WBC 5.1 03/27/2020   NEUTROABS 2.8 03/27/2020   HGB 12.6 03/27/2020   HCT 37.9 03/27/2020   MCV 92.7 03/27/2020   PLT 173 03/27/2020      Chemistry      Component Value Date/Time   NA 138 03/06/2020 0843   NA 139 04/14/2017 0742   K 4.4 03/06/2020 0843   K 4.0 04/14/2017 0742   CL 106 03/06/2020 0843   CO2 25 03/06/2020 0843   CO2 21 (L) 04/14/2017 0742   BUN 17 03/06/2020 0843   BUN  13.1 04/14/2017 0742   CREATININE 0.99 03/06/2020 0843   CREATININE 0.87 07/19/2019 0951   CREATININE 0.9 04/14/2017 0742      Component Value Date/Time   CALCIUM 9.0 03/06/2020 0843   CALCIUM 9.2 04/14/2017 0742   ALKPHOS 70 03/06/2020 0843   ALKPHOS 70 04/14/2017 0742   AST 15 03/06/2020 0843   AST 21 07/19/2019 0951   AST 17 04/14/2017 0742   ALT 10 03/06/2020 0843   ALT 13 07/19/2019 0951   ALT 14 04/14/2017 0742   BILITOT 0.6 03/06/2020 0843   BILITOT 0.3 07/19/2019 0951  BILITOT 0.37 04/14/2017 0742

## 2020-03-27 NOTE — Assessment & Plan Note (Signed)
So far, she tolerated bevacizumab well Her blood pressure is satisfactory She will continue treatment every 3 weeks as scheduled Due to stability of her imaging study, I will space out interval between imaging study Her next imaging would be around March 2022

## 2020-03-27 NOTE — Patient Instructions (Signed)

## 2020-03-27 NOTE — Progress Notes (Signed)
Patient discharged in stable condition with no complaints 

## 2020-03-27 NOTE — Patient Instructions (Signed)
[facility] Discharge Instructions for Patients Receiving Chemotherapy  Today you received the following chemotherapy agents: bevacizumab  To help prevent nausea and vomiting after your treatment, we encourage you to take your nausea medication as directed.   If you develop nausea and vomiting that is not controlled by your nausea medication, call the clinic.   BELOW ARE SYMPTOMS THAT SHOULD BE REPORTED IMMEDIATELY:  *FEVER GREATER THAN 100.5 F  *CHILLS WITH OR WITHOUT FEVER  NAUSEA AND VOMITING THAT IS NOT CONTROLLED WITH YOUR NAUSEA MEDICATION  *UNUSUAL SHORTNESS OF BREATH  *UNUSUAL BRUISING OR BLEEDING  TENDERNESS IN MOUTH AND THROAT WITH OR WITHOUT PRESENCE OF ULCERS  *URINARY PROBLEMS  *BOWEL PROBLEMS  UNUSUAL RASH Items with * indicate a potential emergency and should be followed up as soon as possible.  Feel free to call the clinic should you have any questions or concerns. The clinic phone number is [phone].  Please show the CHEMO ALERT CARD at check-in to the Emergency Department and triage nurse.   

## 2020-03-28 LAB — CA 125: Cancer Antigen (CA) 125: 23.7 U/mL (ref 0.0–38.1)

## 2020-04-17 ENCOUNTER — Inpatient Hospital Stay: Payer: BC Managed Care – PPO

## 2020-04-17 ENCOUNTER — Other Ambulatory Visit: Payer: Self-pay

## 2020-04-17 VITALS — BP 147/72 | HR 63 | Temp 98.0°F | Resp 18 | Ht 65.0 in | Wt 170.5 lb

## 2020-04-17 DIAGNOSIS — C562 Malignant neoplasm of left ovary: Secondary | ICD-10-CM

## 2020-04-17 DIAGNOSIS — Z79899 Other long term (current) drug therapy: Secondary | ICD-10-CM | POA: Diagnosis not present

## 2020-04-17 DIAGNOSIS — Z7189 Other specified counseling: Secondary | ICD-10-CM

## 2020-04-17 DIAGNOSIS — C5701 Malignant neoplasm of right fallopian tube: Secondary | ICD-10-CM

## 2020-04-17 DIAGNOSIS — C569 Malignant neoplasm of unspecified ovary: Secondary | ICD-10-CM

## 2020-04-17 DIAGNOSIS — Z5112 Encounter for antineoplastic immunotherapy: Secondary | ICD-10-CM | POA: Diagnosis not present

## 2020-04-17 LAB — CMP (CANCER CENTER ONLY)
ALT: 11 U/L (ref 0–44)
AST: 18 U/L (ref 15–41)
Albumin: 3.9 g/dL (ref 3.5–5.0)
Alkaline Phosphatase: 54 U/L (ref 38–126)
Anion gap: 9 (ref 5–15)
BUN: 13 mg/dL (ref 8–23)
CO2: 24 mmol/L (ref 22–32)
Calcium: 9.4 mg/dL (ref 8.9–10.3)
Chloride: 106 mmol/L (ref 98–111)
Creatinine: 0.97 mg/dL (ref 0.44–1.00)
GFR, Estimated: >60 mL/min (ref >60)
Glucose, Bld: 116 mg/dL — ABNORMAL HIGH (ref 70–99)
Potassium: 4.5 mmol/L (ref 3.5–5.1)
Sodium: 139 mmol/L (ref 135–145)
Total Bilirubin: 0.3 mg/dL (ref 0.3–1.2)
Total Protein: 7.2 g/dL (ref 6.5–8.1)

## 2020-04-17 LAB — CBC WITH DIFFERENTIAL (CANCER CENTER ONLY)
Abs Immature Granulocytes: 0.01 10*3/uL (ref 0.00–0.07)
Basophils Absolute: 0.1 10*3/uL (ref 0.0–0.1)
Basophils Relative: 1 %
Eosinophils Absolute: 0.3 10*3/uL (ref 0.0–0.5)
Eosinophils Relative: 5 %
HCT: 40.2 % (ref 36.0–46.0)
Hemoglobin: 13.3 g/dL (ref 12.0–15.0)
Immature Granulocytes: 0 %
Lymphocytes Relative: 33 %
Lymphs Abs: 1.7 10*3/uL (ref 0.7–4.0)
MCH: 31.4 pg (ref 26.0–34.0)
MCHC: 33.1 g/dL (ref 30.0–36.0)
MCV: 94.8 fL (ref 80.0–100.0)
Monocytes Absolute: 0.4 10*3/uL (ref 0.1–1.0)
Monocytes Relative: 7 %
Neutro Abs: 2.8 10*3/uL (ref 1.7–7.7)
Neutrophils Relative %: 54 %
Platelet Count: 180 10*3/uL (ref 150–400)
RBC: 4.24 MIL/uL (ref 3.87–5.11)
RDW: 13 % (ref 11.5–15.5)
WBC Count: 5.3 10*3/uL (ref 4.0–10.5)
nRBC: 0 % (ref 0.0–0.2)

## 2020-04-17 LAB — TOTAL PROTEIN, URINE DIPSTICK: Protein, ur: NEGATIVE mg/dL

## 2020-04-17 MED ORDER — SODIUM CHLORIDE 0.9% FLUSH
10.0000 mL | INTRAVENOUS | Status: DC | PRN
  Administered 2020-04-17: 10 mL
  Filled 2020-04-17: qty 10

## 2020-04-17 MED ORDER — SODIUM CHLORIDE 0.9 % IV SOLN
Freq: Once | INTRAVENOUS | Status: AC
  Filled 2020-04-17: qty 250

## 2020-04-17 MED ORDER — SODIUM CHLORIDE 0.9 % IV SOLN
15.0000 mg/kg | Freq: Once | INTRAVENOUS | Status: AC
  Administered 2020-04-17: 1200 mg via INTRAVENOUS
  Filled 2020-04-17: qty 48

## 2020-04-17 MED ORDER — HEPARIN SOD (PORK) LOCK FLUSH 100 UNIT/ML IV SOLN
500.0000 [IU] | Freq: Once | INTRAVENOUS | Status: AC | PRN
  Administered 2020-04-17: 500 [IU]
  Filled 2020-04-17: qty 5

## 2020-04-17 MED ORDER — SODIUM CHLORIDE 0.9% FLUSH
10.0000 mL | Freq: Once | INTRAVENOUS | Status: AC
Start: 2020-04-17 — End: 2020-04-17
  Administered 2020-04-17: 10 mL
  Filled 2020-04-17: qty 10

## 2020-04-17 NOTE — Patient Instructions (Signed)
[facility] Discharge Instructions for Patients Receiving Chemotherapy  Today you received the following chemotherapy agents Avastin.   To help prevent nausea and vomiting after your treatment, we encourage you to take your nausea medication as prescribed.    If you develop nausea and vomiting that is not controlled by your nausea medication, call the clinic.   BELOW ARE SYMPTOMS THAT SHOULD BE REPORTED IMMEDIATELY:  *FEVER GREATER THAN 100.5 F  *CHILLS WITH OR WITHOUT FEVER  NAUSEA AND VOMITING THAT IS NOT CONTROLLED WITH YOUR NAUSEA MEDICATION  *UNUSUAL SHORTNESS OF BREATH  *UNUSUAL BRUISING OR BLEEDING  TENDERNESS IN MOUTH AND THROAT WITH OR WITHOUT PRESENCE OF ULCERS  *URINARY PROBLEMS  *BOWEL PROBLEMS  UNUSUAL RASH Items with * indicate a potential emergency and should be followed up as soon as possible.  Feel free to call the clinic should you have any questions or concerns. The clinic phone number is [phone].  Please show the CHEMO ALERT CARD at check-in to the Emergency Department and triage nurse.   

## 2020-04-20 ENCOUNTER — Telehealth: Payer: Self-pay

## 2020-04-20 MED ORDER — MAGIC MOUTHWASH W/LIDOCAINE: 5.0000 mL | Freq: Four times a day (QID) | ORAL | 0 refills | Status: DC

## 2020-04-20 NOTE — Telephone Encounter (Signed)
Patient called reporting new symptoms of mouth pain/mouth sore.    Patient is s/p D1C10 Bevacizumab.    RN reviewed with Lillard Anes, NP - Verbal orders for Magic Mouth Wash -   RN notified patient.   Rx called into pharmacy.

## 2020-04-25 ENCOUNTER — Telehealth: Payer: Self-pay

## 2020-04-25 MED FILL — CMPD MMW LID:NYS:DPH:MAX: 7 days supply | Qty: 140 | Fill #0

## 2020-04-25 NOTE — Telephone Encounter (Signed)
She called and left a message that [facility] never got the magic mouth wash Rx.  Called back and told her Rx sent to [facility] outpatient pharmacy. They will mix Rx when she shows up. She verbalized understanding.

## 2020-05-05 ENCOUNTER — Telehealth: Payer: Self-pay | Admitting: Hematology and Oncology

## 2020-05-05 NOTE — Telephone Encounter (Signed)
Rescheduled from 1/17. Called pt and confirmed 1/20 appts

## 2020-05-08 ENCOUNTER — Ambulatory Visit: Payer: BC Managed Care – PPO

## 2020-05-08 ENCOUNTER — Other Ambulatory Visit: Payer: BC Managed Care – PPO

## 2020-05-08 ENCOUNTER — Ambulatory Visit: Payer: BC Managed Care – PPO | Admitting: Hematology and Oncology

## 2020-05-11 ENCOUNTER — Inpatient Hospital Stay: Payer: BC Managed Care – PPO

## 2020-05-11 ENCOUNTER — Inpatient Hospital Stay: Payer: BC Managed Care – PPO | Attending: Hematology and Oncology

## 2020-05-11 ENCOUNTER — Encounter: Payer: Self-pay | Admitting: Hematology and Oncology

## 2020-05-11 ENCOUNTER — Other Ambulatory Visit: Payer: Self-pay | Admitting: Hematology and Oncology

## 2020-05-11 ENCOUNTER — Other Ambulatory Visit: Payer: Self-pay

## 2020-05-11 ENCOUNTER — Inpatient Hospital Stay (HOSPITAL_BASED_OUTPATIENT_CLINIC_OR_DEPARTMENT_OTHER): Payer: BC Managed Care – PPO | Admitting: Hematology and Oncology

## 2020-05-11 VITALS — BP 143/65 | HR 78 | Temp 97.4°F | Resp 16 | Ht 65.0 in | Wt 173.2 lb

## 2020-05-11 DIAGNOSIS — C5701 Malignant neoplasm of right fallopian tube: Secondary | ICD-10-CM

## 2020-05-11 DIAGNOSIS — C562 Malignant neoplasm of left ovary: Secondary | ICD-10-CM

## 2020-05-11 DIAGNOSIS — R7989 Other specified abnormal findings of blood chemistry: Secondary | ICD-10-CM

## 2020-05-11 DIAGNOSIS — C569 Malignant neoplasm of unspecified ovary: Secondary | ICD-10-CM

## 2020-05-11 DIAGNOSIS — Z5112 Encounter for antineoplastic immunotherapy: Secondary | ICD-10-CM | POA: Diagnosis not present

## 2020-05-11 DIAGNOSIS — Z7189 Other specified counseling: Secondary | ICD-10-CM

## 2020-05-11 DIAGNOSIS — Z79899 Other long term (current) drug therapy: Secondary | ICD-10-CM | POA: Diagnosis not present

## 2020-05-11 DIAGNOSIS — I10 Essential (primary) hypertension: Secondary | ICD-10-CM

## 2020-05-11 LAB — CBC WITH DIFFERENTIAL (CANCER CENTER ONLY)
Abs Immature Granulocytes: 0.02 10*3/uL (ref 0.00–0.07)
Basophils Absolute: 0.1 10*3/uL (ref 0.0–0.1)
Basophils Relative: 1 %
Eosinophils Absolute: 0.3 10*3/uL (ref 0.0–0.5)
Eosinophils Relative: 5 %
HCT: 39.1 % (ref 36.0–46.0)
Hemoglobin: 13.3 g/dL (ref 12.0–15.0)
Immature Granulocytes: 0 %
Lymphocytes Relative: 30 %
Lymphs Abs: 1.6 10*3/uL (ref 0.7–4.0)
MCH: 31.5 pg (ref 26.0–34.0)
MCHC: 34 g/dL (ref 30.0–36.0)
MCV: 92.7 fL (ref 80.0–100.0)
Monocytes Absolute: 0.5 10*3/uL (ref 0.1–1.0)
Monocytes Relative: 9 %
Neutro Abs: 2.9 10*3/uL (ref 1.7–7.7)
Neutrophils Relative %: 55 %
Platelet Count: 161 10*3/uL (ref 150–400)
RBC: 4.22 MIL/uL (ref 3.87–5.11)
RDW: 12.8 % (ref 11.5–15.5)
WBC Count: 5.3 10*3/uL (ref 4.0–10.5)
nRBC: 0 % (ref 0.0–0.2)

## 2020-05-11 LAB — COMPREHENSIVE METABOLIC PANEL
ALT: 10 U/L (ref 0–44)
AST: 15 U/L (ref 15–41)
Albumin: 3.6 g/dL (ref 3.5–5.0)
Alkaline Phosphatase: 64 U/L (ref 38–126)
Anion gap: 9 (ref 5–15)
BUN: 16 mg/dL (ref 8–23)
CO2: 25 mmol/L (ref 22–32)
Calcium: 9 mg/dL (ref 8.9–10.3)
Chloride: 106 mmol/L (ref 98–111)
Creatinine, Ser: 1.12 mg/dL — ABNORMAL HIGH (ref 0.44–1.00)
GFR, Estimated: 55 mL/min — ABNORMAL LOW (ref >60)
Glucose, Bld: 118 mg/dL — ABNORMAL HIGH (ref 70–99)
Potassium: 4.1 mmol/L (ref 3.5–5.1)
Sodium: 140 mmol/L (ref 135–145)
Total Bilirubin: 0.4 mg/dL (ref 0.3–1.2)
Total Protein: 6.8 g/dL (ref 6.5–8.1)

## 2020-05-11 LAB — TOTAL PROTEIN, URINE DIPSTICK: Protein, ur: NEGATIVE mg/dL

## 2020-05-11 MED ORDER — SODIUM CHLORIDE 0.9% FLUSH
10.0000 mL | INTRAVENOUS | Status: DC | PRN
  Administered 2020-05-11: 10 mL
  Filled 2020-05-11: qty 10

## 2020-05-11 MED ORDER — SODIUM CHLORIDE 0.9 % IV SOLN
15.0000 mg/kg | Freq: Once | INTRAVENOUS | Status: AC
  Administered 2020-05-11: 1200 mg via INTRAVENOUS
  Filled 2020-05-11: qty 48

## 2020-05-11 MED ORDER — HEPARIN SOD (PORK) LOCK FLUSH 100 UNIT/ML IV SOLN
500.0000 [IU] | Freq: Once | INTRAVENOUS | Status: AC | PRN
  Administered 2020-05-11: 500 [IU]
  Filled 2020-05-11: qty 5

## 2020-05-11 MED ORDER — SODIUM CHLORIDE 0.9% FLUSH
10.0000 mL | Freq: Once | INTRAVENOUS | Status: AC
  Administered 2020-05-11: 10 mL
  Filled 2020-05-11: qty 10

## 2020-05-11 MED ORDER — SODIUM CHLORIDE 0.9 % IV SOLN
Freq: Once | INTRAVENOUS | Status: AC
  Filled 2020-05-11: qty 250

## 2020-05-11 NOTE — Patient Instructions (Signed)
[facility] Discharge Instructions for Patients Receiving Chemotherapy  Today you received the following chemotherapy agents Avastin.   To help prevent nausea and vomiting after your treatment, we encourage you to take your nausea medication as prescribed.    If you develop nausea and vomiting that is not controlled by your nausea medication, call the clinic.   BELOW ARE SYMPTOMS THAT SHOULD BE REPORTED IMMEDIATELY:  *FEVER GREATER THAN 100.5 F  *CHILLS WITH OR WITHOUT FEVER  NAUSEA AND VOMITING THAT IS NOT CONTROLLED WITH YOUR NAUSEA MEDICATION  *UNUSUAL SHORTNESS OF BREATH  *UNUSUAL BRUISING OR BLEEDING  TENDERNESS IN MOUTH AND THROAT WITH OR WITHOUT PRESENCE OF ULCERS  *URINARY PROBLEMS  *BOWEL PROBLEMS  UNUSUAL RASH Items with * indicate a potential emergency and should be followed up as soon as possible.  Feel free to call the clinic should you have any questions or concerns. The clinic phone number is [phone].  Please show the CHEMO ALERT CARD at check-in to the Emergency Department and triage nurse.   

## 2020-05-11 NOTE — Assessment & Plan Note (Signed)
She has intermittent elevated serum creatinine Observe closely for now 

## 2020-05-11 NOTE — Assessment & Plan Note (Signed)
Her blood pressure measured here is usually a little bit more elevated Blood pressure control at home is satisfactory She will continue current prescribed antihypertensives

## 2020-05-11 NOTE — Assessment & Plan Note (Signed)
So far, she tolerated bevacizumab well Her blood pressure is satisfactory She will continue treatment every 3 weeks as scheduled Due to stability of her imaging study, I will space out interval between imaging study Her next imaging would be around March 2022 

## 2020-05-11 NOTE — Progress Notes (Signed)
[facility] OFFICE PROGRESS NOTE  Patient Care Team: Gaynelle Arabian, MD as PCP - General (Family Medicine) Heath Lark, MD as Consulting Physician (Hematology and Oncology) Everitt Amber, MD as Consulting Physician (Obstetrics and Gynecology)  ASSESSMENT & PLAN:  Adenocarcinoma of right fallopian tube [facility]) So far, she tolerated bevacizumab well Her blood pressure is satisfactory She will continue treatment every 3 weeks as scheduled Due to stability of her imaging study, I will space out interval between imaging study Her next imaging would be around March 2022  Essential hypertension Her blood pressure measured here is usually a little bit more elevated Blood pressure control at home is satisfactory She will continue current prescribed antihypertensives  Elevated serum creatinine She has intermittent elevated serum creatinine Observe closely for now   Orders Placed This Encounter  Procedures  . CT ABDOMEN PELVIS W CONTRAST    Standing Status:   Future    Standing Expiration Date:   05/11/2021    Order Specific Question:   If indicated for the ordered procedure, I authorize the administration of contrast media per Radiology protocol    Answer:   Yes    Order Specific Question:   Preferred imaging location?    Answer:   [facility]    Order Specific Question:   Radiology Contrast Protocol - do NOT remove file path    Answer:   [email or web address]    All questions were answered. The patient knows to call the clinic with any problems, questions or concerns. The total time spent in the appointment was 20 minutes encounter with patients including review of chart and various tests results, discussions about plan of care and coordination of care plan   Heath Lark, MD 05/11/2020 10:48 AM  INTERVAL HISTORY: Please see below for problem oriented charting. She returns for further follow-up with her husband She is doing  well No abdominal pain Blood pressure control at home is stable  SUMMARY OF ONCOLOGIC HISTORY: Oncology History Overview Note  Neg genetics   Adenocarcinoma of right fallopian tube ([place])  08/12/2016 Initial Diagnosis   The patient has many months of vague symptoms of fullness in the pelvis, urinary frequency and difficulty with defecation. She denies vaginal bleeding or rectal bleeding.    08/12/2016 Imaging   Abdomen X-ray in the ER: Moderate colonic stool burden without evidence of enteric obstruction   11/13/2016 Imaging   TVUS was performed on 11/13/16 which showed a large hypoechoic lobular mass seen posterior to the uterus measuring 18.2x16.1x11.8cm, blood flow was seen along the periphery of the mass. It was considered to be likely to be a fibroid vs pelvic mass vs ovarian mass. Neither ovary was removed. Moderate amount of free fluid in the pelvis. THe uterus measures 4x10x4cm. The endometrium is 30mm.    12/05/2016 Imaging   MR pelvis 1. Mild limitations as detailed above. 2. Heterogeneous pelvic mass is favored to arise from the right ovary. Favor a solid ovarian neoplasm such as fibroma/Brenner's tumor. Given lesion size and heterogeneous T2 signal out, complicating torsion cannot be excluded. 3. Moderate abdominopelvic ascites, without specific evidence of peritoneal metastasis. Consider further evaluation with contrast-enhanced abdominopelvic CT.    12/26/2016 Pathology Results   1. Uterus, ovaries and fallopian tubes - ADENOCARCINOMA INVOLVING RIGHT FALLOPIAN TUBE, RIGHT AND LEFT OVARIES, UTERINE SEROSA AND PELVIC MASS. - SEE ONCOLOGY TABLE AND COMMENT. - UTERINE CERVIX, ENDOMETRIUM, MYOMETRIUM AND LEFT FALLOPIAN TUBE FREE OF TUMOR. 2. Omentum, resection for tumor - ADENOCARCINOMA. Microscopic Comment 1.  ONCOLOGY TABLE - FALLOPIAN TUBE. SEE COMMENT 1. Specimen, including laterality: Uterus, bilateral adnexa, pelvic mass and omentum. 2. Procedure: Hysterectomy with  bilateral salpingo-oophorectomy, pelvic mass excision and omentectomy. 3. Lymph node sampling performed: No 4. Tumor site: See comment. 5. Tumor location in fallopian tube: Right fallopian tube fimbria 6. Specimen integrity (intact/ruptured/disrupted): Intact 7. Tumor size (cm): 18 cm, see comment. 8. Histologic type: Adenocarcinoma 9. Grade: High grade 10. Microscopic tumor extension: Tumor involves right fallopian tube, right and left ovaries, uterine serosa, pelvic mass and omentum. 11. Margins: See comment. 12. Lymph-Vascular invasion: Present 13. Lymph nodes: # examined: 0; # positive: N/A 14. TNM: pT3c, pNX 15. FIGO Stage (based on pathologic findings, needs clinical correlation: III-C 16. Comments: There is an 18 cm pelvic mass which is a high grade adenocarcinoma and there is a 12.2 cm  segment of omentum which is extensively involved with adenocarcinoma. The tumor also involves the fimbria of the right fallopian tube and the parenchyma of the right and left ovaries as well as uterine serosa. The fimbria of the right fallopian has intraepithelial atypia consistent with a precursor lesion and therefore this is most consistent with primary fallopian tube adenocarcinoma. A primary peritoneal serous carcinoma is a less likely possibility.    12/26/2016 Pathology Results   PERITONEAL/ASCITIC FLUID(SPECIMEN 1 OF 1 COLLECTED 12/26/16): POORLY DIFFERENTIATED ADENOCARCINOMA   12/26/2016 Surgery   Procedure(s) Performed: Exploratory laparotomy with total abdominal hysterectomy, bilateral salpingo-oophorectomy, omentectomy radical tumor debulking for ovarian cancer .  Surgeon: Thereasa Solo, MD.   Operative Findings: 20cm left ovarian mass densely adherent to the posterior uterus, right tube and ovary, cervix, sigmoid colon. 10cm omental cake. 4L ascites. 658m size tumor nodules on the serosa of the terminal ileum and proximal sigmoid colon. 1358mnodules on right diaphragm.    This represented an  optimal cytoreduction (R1) with 58m458modules on intestine and diaphragm representing gross visible disease   01/16/2017 Procedure   Placement of single lumen port a cath via right internal jugular vein. The catheter tip lies at the cavo-atrial junction. A power injectable port a cath was placed and is ready for immediate use.   01/17/2017 Imaging   1. 3.5 x 7.2 x 4.6 cm fluid collection along the vaginal cuff, posterior the bladder and extending into the right adnexal space. This lesion demonstrates rim enhancement. Imaging features could be related to a loculated postoperative seroma or hematoma. Superinfection cannot be excluded by CT. Given debulking surgery was 3 weeks ago, this entire structure is un likely to represent neoplasm, but peritoneal involvement could have this appearance. 2. Small fluid collection in the left para colic gutter without rim enhancement. 3. Irregular/nodular appearance of the peritoneal M in the anatomic pelvis with areas of subtle nodularity between the stomach and the spleen. Close attention in these regions on follow-up recommended as metastatic disease is a concern. 4. Mildly enlarged hepatoduodenal ligament lymph node. Attention on follow-up recommended.   01/20/2017 Tumor Marker   Patient's tumor was tested for the following markers: CA-125 Results of the tumor marker test revealed 46.6   01/28/2017 Genetic Testing       Negative genetic testing on the [facility].  The [facility] [place] panel offered by [employer] sequencing and deletion/duplication testing of the following 28 genes: APC, ATM, BARD1, BMPR1A, BRCA1, BRCA2, BRIP1, CHD1, CDK4, CDKN2A, CHEK2, EPCAM (large rearrangement only), MLH1, MSH2, MSH6, MUTYH, NBN, PALB2, PMS2, PTEN, RAD51C, RAD51D, SMAD4, STK11, and TP53. Sequencing was performed for  select regions of POLE and POLD1, and large rearrangement analysis was performed for select regions of GREM1. The report date is January 28, 2017.  HRD testing looking for genomic instability and BRCA mutations was negative.  The report date of this test is January 27, 2017.    01/31/2017 Tumor Marker   Patient's tumor was tested for the following markers: CA-125 Results of the tumor marker test revealed 22.4   03/04/2017 Tumor Marker   Patient's tumor was tested for the following markers: CA-125 Results of the tumor marker test revealed 13.8   04/18/2017 Tumor Marker   Patient's tumor was tested for the following markers: CA-125 Results of the tumor marker test revealed 11.9   05/05/2017 Tumor Marker   Patient's tumor was tested for the following markers: CA-125 Results of the tumor marker test revealed 12   05/26/2017 Tumor Marker   Patient's tumor was tested for the following markers: CA-125 Results of the tumor marker test revealed 10.6   05/26/2017 Imaging   1. A previously noted postoperative fluid collection in the low pelvis and residual ascites seen on the prior examination has resolved on today's study. No findings to suggest residual/recurrent disease on today's examination. No definite solid organ metastasis identified in the abdomen or pelvis. No lymphadenopathy. 2. Aortic atherosclerosis.   06/10/2017 Procedure   Successful right IJ vein Port-A-Cath explant.   03/09/2018 Tumor Marker   Patient's tumor was tested for the following markers: CA-125 Results of the tumor marker test revealed 12.1   05/26/2018 Tumor Marker   Patient's tumor was tested for the following markers: CA-125 Results of the tumor marker test revealed 13.8   09/09/2018 Tumor Marker   Patient's tumor was tested for the following markers: CA-125 Results of the tumor marker test revealed 23.9   12/18/2018 Tumor Marker   Patient's tumor was tested for the following markers: CA-125 Results of the tumor marker test revealed 43.8   02/27/2019 - 02/28/2019 Hospital Admission   She was admitted for bowel obstruction   02/27/2019 Imaging    1. High-grade small bowel obstruction with transition point in the pelvis in an area of suspected desmoplastic reaction surrounding a 1.3 cm spiculated nodule in the mesentery, suspicious for carcinoid. There is hyperenhancement near the tip of the appendix and loss of the normal fat plane between it and the adjacent sigmoid colon, potentially the site of primary lesion. 2. Multiple new small subcentimeter hyperdense lesions scattered along the liver capsule, concerning for metastases. Enlarged hyperenhancing gastrohepatic and portacaval lymph nodes concerning for nodal metastases. 3. Very mild right hydroureteronephrosis to the level of the desmoplastic reaction in the pelvis, potentially involving the right ureter. 4. Infraumbilical ventral abdominal wall diastasis containing a small nondilated portion of transverse colon. 5. Trace ascites. 6. Cholelithiasis. 7.  Aortic atherosclerosis (ICD10-I70.0).     03/05/2019 Tumor Marker   Patient's tumor was tested for the following markers: CA-125 Results of the tumor marker test revealed 70.1   03/12/2019 Echocardiogram   IMPRESSIONS     1. Left ventricular ejection fraction, by visual estimation, is 60 to 65%. The left ventricle has normal function. There is no left ventricular hypertrophy.  2. Left ventricular diastolic parameters are consistent with Grade I diastolic dysfunction (impaired relaxation).  3. Global right ventricle has normal systolic function.The right ventricular size is normal. No increase in right ventricular wall thickness.  4. Left atrial size was normal.  5. Right atrial size was normal.  6. The pericardium was not  assessed.  7. The mitral valve is normal in structure. No evidence of mitral valve regurgitation.  8. The tricuspid valve is normal in structure. Tricuspid valve regurgitation is trivial.  9. The aortic valve is normal in structure. Aortic valve regurgitation is not visualized. 10. The pulmonic valve was grossly  normal. Pulmonic valve regurgitation is not visualized. 11. The average left ventricular global longitudinal strain is -17.6 %.   03/16/2019 Procedure   Successful placement of a right internal jugular approach power injectable Port-A-Cath. The catheter is ready for immediate use.     04/19/2019 Tumor Marker   Patient's tumor was tested for the following markers: CA-125 Results of the tumor marker test revealed 39.8   05/17/2019 Tumor Marker   Patient's tumor was tested for the following markers: CA-125 Results of the tumor marker test revealed 27   05/28/2019 Tumor Marker   Patient's tumor was tested for the following markers: CA-125 Results of the tumor marker test revealed 27.7.   06/11/2019 Imaging   1. No new or progressive metastatic disease in the abdomen or pelvis. 2. Tiny noncalcified perisplenic implant is decreased. Calcified pericardiophrenic, retroperitoneal and bilateral inguinal lymph nodes and scattered small calcified perihepatic and perisplenic implants are stable. 3.  Aortic Atherosclerosis (ICD10-I70.0).       06/17/2019 Echocardiogram    1. Left ventricular ejection fraction, by estimation, is 65 to 70%. The left ventricle has normal function. The left ventricle has no regional wall motion abnormalities. Left ventricular diastolic parameters were normal.  2. Right ventricular systolic function is normal. The right ventricular size is normal.  3. The mitral valve is normal in structure and function. No evidence of mitral valve regurgitation. No evidence of mitral stenosis.  4. The aortic valve is normal in structure and function. Aortic valve regurgitation is not visualized. No aortic stenosis is present.   06/21/2019 Tumor Marker   Patient's tumor was tested for the following markers: CA-125 Results of the tumor marker test revealed 18.7   07/19/2019 Tumor Marker   Patient's tumor was tested for the following markers: CA-125 Results of the tumor marker test revealed  16.7   08/30/2019 Tumor Marker   Patient's tumor was tested for the following markers: CA-125. Results of the tumor marker test revealed 13.9   09/15/2019 Echocardiogram    1. Left ventricular ejection fraction, by estimation, is 65 to 70%. The left ventricle has normal function. The left ventricle has no regional wall motion abnormalities. Left ventricular diastolic parameters are consistent with Grade I diastolic dysfunction (impaired relaxation). The average left ventricular global longitudinal strain is 18.1 %. The global longitudinal strain is normal.  2. Right ventricular systolic function is normal. The right ventricular size is normal.  3. The mitral valve is normal in structure. No evidence of mitral valve regurgitation. No evidence of mitral stenosis.  4. The aortic valve is normal in structure. Aortic valve regurgitation is not visualized. No aortic stenosis is present.  5. The inferior vena cava is normal in size with greater than 50% respiratory variability, suggesting right atrial pressure of 3 mmHg.     09/24/2019 Imaging   1. Stable exam. No new or progressive metastatic disease on today's study. 2. Small subcapsular hypodensity in the lateral spleen, new in the interval, but indeterminate. Attention on follow-up recommended. 3. The calcified upper abdominal and groin lymph nodes are stable as are the scattered calcified and noncalcified peritoneal implants. 4. Stable midline ventral hernia containing a short segment of colon without complicating  features. 5. Aortic Atherosclerosis (ICD10-I70.0).     10/04/2019 -  Chemotherapy    Patient is on Treatment Plan: OVARIAN BEVACIZUMAB Q21D      10/04/2019 Tumor Marker   Patient's tumor was tested for the following markers: CA-125. Results of the tumor marker test revealed 12.9.   11/01/2019 Tumor Marker   Patient's tumor was tested for the following markers: CA-125 Results of the tumor marker test revealed 15.4   12/13/2019 Tumor  Marker   Patient's tumor was tested for the following markers: CA-125 Results of the tumor marker test revealed 14.8   12/31/2019 Imaging   1. Unchanged tiny peritoneal nodule adjacent to the spleen measuring 6 mm. Other previously noted peritoneal nodules are imperceptible on present examination. 2. Unchanged prominent, partially calcified portacaval lymph node and bilateral inguinal lymph nodes. 3. Unchanged subtle, nonspecific subcapsular lesion of the spleen.  4. No evidence of new metastatic disease in the abdomen or pelvis. 5. Status post hysterectomy. 6. Unchanged low midline ventral abdominal hernia containing a single loop of nonobstructed transverse colon. 7. Cholelithiasis. 8. Aortic Atherosclerosis (ICD10-I70.0).       01/03/2020 Tumor Marker   Patient's tumor was tested for the following markers: CA-125 Results of the tumor marker test revealed 11.7.   01/24/2020 Tumor Marker   Patient's tumor was tested for the following markers: CA-125. Results of the tumor marker test revealed 15.1   03/06/2020 Tumor Marker   Patient's tumor was tested for the following markers: CA-125. Results of the tumor marker test revealed 20.3   03/27/2020 Tumor Marker   Patient's tumor was tested for the following markers: CA-125 Results of the tumor marker test revealed 23.7     REVIEW OF SYSTEMS:   Constitutional: Denies fevers, chills or abnormal weight loss Eyes: Denies blurriness of vision Ears, nose, mouth, throat, and face: Denies mucositis or sore throat Respiratory: Denies cough, dyspnea or wheezes Cardiovascular: Denies palpitation, chest discomfort or lower extremity swelling Gastrointestinal:  Denies nausea, heartburn or change in bowel habits Skin: Denies abnormal skin rashes Lymphatics: Denies new lymphadenopathy or easy bruising Neurological:Denies numbness, tingling or new weaknesses Behavioral/Psych: Mood is stable, no new changes  All other systems were reviewed with  the patient and are negative.  I have reviewed the past medical history, past surgical history, social history and family history with the patient and they are unchanged from previous note.  ALLERGIES:  is allergic to dilaudid [hydromorphone hcl], morphine and related, and sudafed [pseudoephedrine hcl].  MEDICATIONS:  Current Outpatient Medications  Medication Sig Dispense Refill  . acetaminophen (TYLENOL) 325 MG tablet Take 975 mg by mouth every 6 (six) hours as needed (pain).    Marland Kitchen amLODipine (NORVASC) 10 MG tablet Take 1 tablet (10 mg total) by mouth daily. 30 tablet 9  . lidocaine-prilocaine (EMLA) cream APP EXT AA 1 TIME    . ondansetron (ZOFRAN) 8 MG tablet Take 1 tablet (8 mg total) by mouth every 8 (eight) hours as needed. 30 tablet 1  . prochlorperazine (COMPAZINE) 10 MG tablet TAKE 1 TABLET BY MOUTH EVERY 6 HOURS AS NEEDED FOR NAUSEA AND/OR VOMITING 30 tablet 1  . venlafaxine XR (EFFEXOR-XR) 37.5 MG 24 hr capsule TAKE 1 CAPSULE BY MOUTH DAILY WITH BREAKFAST (Patient taking differently: Take 37.5 mg by mouth daily with breakfast. ) 90 capsule 6   No current facility-administered medications for this visit.   Facility-Administered Medications Ordered in Other Visits  Medication Dose Route Frequency Provider Last Rate Last Admin  . sodium  chloride flush (NS) 0.9 % injection 10 mL  10 mL Intravenous PRN Heath Lark, MD   10 mL at 02/11/17 0839  . sodium chloride flush (NS) 0.9 % injection 10 mL  10 mL Intravenous PRN Alvy Bimler, Franceska Strahm, MD   10 mL at 03/04/17 1510  . sodium chloride flush (NS) 0.9 % injection 10 mL  10 mL Intracatheter PRN Alvy Bimler, Deon Ivey, MD   10 mL at 05/11/20 1012    PHYSICAL EXAMINATION: ECOG PERFORMANCE STATUS: 0 - Asymptomatic  Vitals:   05/11/20 0839  BP: (!) 143/65  Pulse: 78  Resp: 16  Temp: (!) 97.4 F (36.3 C)  SpO2: 98%   Filed Weights   05/11/20 0839  Weight: 173 lb 3.2 oz (78.6 kg)    GENERAL:alert, no distress and comfortable SKIN: skin color,  texture, turgor are normal, no rashes or significant lesions EYES: normal, Conjunctiva are pink and non-injected, sclera clear OROPHARYNX:no exudate, no erythema and lips, buccal mucosa, and tongue normal  NECK: supple, thyroid normal size, non-tender, without nodularity LYMPH:  no palpable lymphadenopathy in the cervical, axillary or inguinal LUNGS: clear to auscultation and percussion with normal breathing effort HEART: regular rate & rhythm and no murmurs and no lower extremity edema ABDOMEN:abdomen soft, non-tender and normal bowel sounds Musculoskeletal:no cyanosis of digits and no clubbing  NEURO: alert & oriented x 3 with fluent speech, no focal motor/sensory deficits  LABORATORY DATA:  I have reviewed the data as listed    Component Value Date/Time   NA 140 05/11/2020 0830   NA 139 04/14/2017 0742   K 4.1 05/11/2020 0830   K 4.0 04/14/2017 0742   CL 106 05/11/2020 0830   CO2 25 05/11/2020 0830   CO2 21 (L) 04/14/2017 0742   GLUCOSE 118 (H) 05/11/2020 0830   GLUCOSE 247 (H) 04/14/2017 0742   BUN 16 05/11/2020 0830   BUN 13.1 04/14/2017 0742   CREATININE 1.12 (H) 05/11/2020 0830   CREATININE 0.97 04/17/2020 0812   CREATININE 0.9 04/14/2017 0742   CALCIUM 9.0 05/11/2020 0830   CALCIUM 9.2 04/14/2017 0742   PROT 6.8 05/11/2020 0830   PROT 7.3 04/14/2017 0742   ALBUMIN 3.6 05/11/2020 0830   ALBUMIN 3.9 04/14/2017 0742   AST 15 05/11/2020 0830   AST 18 04/17/2020 0812   AST 17 04/14/2017 0742   ALT 10 05/11/2020 0830   ALT 11 04/17/2020 0812   ALT 14 04/14/2017 0742   ALKPHOS 64 05/11/2020 0830   ALKPHOS 70 04/14/2017 0742   BILITOT 0.4 05/11/2020 0830   BILITOT 0.3 04/17/2020 0812   BILITOT 0.37 04/14/2017 0742   GFRNONAA 55 (L) 05/11/2020 0830   GFRNONAA >60 04/17/2020 0812   GFRAA >60 01/24/2020 0924   GFRAA >60 07/19/2019 0951    No results found for: SPEP, UPEP  Lab Results  Component Value Date   WBC 5.3 05/11/2020   NEUTROABS 2.9 05/11/2020   HGB  13.3 05/11/2020   HCT 39.1 05/11/2020   MCV 92.7 05/11/2020   PLT 161 05/11/2020      Chemistry      Component Value Date/Time   NA 140 05/11/2020 0830   NA 139 04/14/2017 0742   K 4.1 05/11/2020 0830   K 4.0 04/14/2017 0742   CL 106 05/11/2020 0830   CO2 25 05/11/2020 0830   CO2 21 (L) 04/14/2017 0742   BUN 16 05/11/2020 0830   BUN 13.1 04/14/2017 0742   CREATININE 1.12 (H) 05/11/2020 0830   CREATININE 0.97 04/17/2020  0350   CREATININE 0.9 04/14/2017 0742      Component Value Date/Time   CALCIUM 9.0 05/11/2020 0830   CALCIUM 9.2 04/14/2017 0742   ALKPHOS 64 05/11/2020 0830   ALKPHOS 70 04/14/2017 0742   AST 15 05/11/2020 0830   AST 18 04/17/2020 0812   AST 17 04/14/2017 0742   ALT 10 05/11/2020 0830   ALT 11 04/17/2020 0812   ALT 14 04/14/2017 0742   BILITOT 0.4 05/11/2020 0830   BILITOT 0.3 04/17/2020 0812   BILITOT 0.37 04/14/2017 0742

## 2020-05-12 LAB — CA 125: Cancer Antigen (CA) 125: 25.4 U/mL (ref 0.0–38.1)

## 2020-05-15 ENCOUNTER — Telehealth: Payer: Self-pay | Admitting: Hematology and Oncology

## 2020-05-15 NOTE — Telephone Encounter (Signed)
Scheduled appt per 1/20 sch msg - pt is aware of appts scheduled

## 2020-05-29 ENCOUNTER — Other Ambulatory Visit: Payer: BC Managed Care – PPO

## 2020-05-29 ENCOUNTER — Ambulatory Visit: Payer: BC Managed Care – PPO

## 2020-06-05 ENCOUNTER — Inpatient Hospital Stay: Payer: Medicare Other | Attending: Hematology and Oncology

## 2020-06-05 ENCOUNTER — Other Ambulatory Visit: Payer: Self-pay

## 2020-06-05 ENCOUNTER — Inpatient Hospital Stay: Payer: Medicare Other

## 2020-06-05 VITALS — BP 124/71 | HR 72 | Temp 97.5°F | Resp 20 | Wt 176.2 lb

## 2020-06-05 DIAGNOSIS — Z5112 Encounter for antineoplastic immunotherapy: Secondary | ICD-10-CM | POA: Insufficient documentation

## 2020-06-05 DIAGNOSIS — Z7189 Other specified counseling: Secondary | ICD-10-CM

## 2020-06-05 DIAGNOSIS — Z79899 Other long term (current) drug therapy: Secondary | ICD-10-CM | POA: Insufficient documentation

## 2020-06-05 DIAGNOSIS — C569 Malignant neoplasm of unspecified ovary: Secondary | ICD-10-CM

## 2020-06-05 DIAGNOSIS — C5701 Malignant neoplasm of right fallopian tube: Secondary | ICD-10-CM

## 2020-06-05 DIAGNOSIS — C562 Malignant neoplasm of left ovary: Secondary | ICD-10-CM

## 2020-06-05 LAB — COMPREHENSIVE METABOLIC PANEL
ALT: 13 U/L (ref 0–44)
AST: 15 U/L (ref 15–41)
Albumin: 3.3 g/dL — ABNORMAL LOW (ref 3.5–5.0)
Alkaline Phosphatase: 72 U/L (ref 38–126)
Anion gap: 10 (ref 5–15)
BUN: 19 mg/dL (ref 8–23)
CO2: 23 mmol/L (ref 22–32)
Calcium: 9.1 mg/dL (ref 8.9–10.3)
Chloride: 107 mmol/L (ref 98–111)
Creatinine, Ser: 1.14 mg/dL — ABNORMAL HIGH (ref 0.44–1.00)
GFR, Estimated: 53 mL/min — ABNORMAL LOW (ref >60)
Glucose, Bld: 151 mg/dL — ABNORMAL HIGH (ref 70–99)
Potassium: 4 mmol/L (ref 3.5–5.1)
Sodium: 140 mmol/L (ref 135–145)
Total Bilirubin: 0.4 mg/dL (ref 0.3–1.2)
Total Protein: 6.7 g/dL (ref 6.5–8.1)

## 2020-06-05 LAB — CBC WITH DIFFERENTIAL/PLATELET
Abs Immature Granulocytes: 0.05 10*3/uL (ref 0.00–0.07)
Basophils Absolute: 0 10*3/uL (ref 0.0–0.1)
Basophils Relative: 1 %
Eosinophils Absolute: 0.4 10*3/uL (ref 0.0–0.5)
Eosinophils Relative: 6 %
HCT: 37.3 % (ref 36.0–46.0)
Hemoglobin: 12.4 g/dL (ref 12.0–15.0)
Immature Granulocytes: 1 %
Lymphocytes Relative: 25 %
Lymphs Abs: 1.6 10*3/uL (ref 0.7–4.0)
MCH: 31.5 pg (ref 26.0–34.0)
MCHC: 33.2 g/dL (ref 30.0–36.0)
MCV: 94.7 fL (ref 80.0–100.0)
Monocytes Absolute: 0.5 10*3/uL (ref 0.1–1.0)
Monocytes Relative: 8 %
Neutro Abs: 3.8 10*3/uL (ref 1.7–7.7)
Neutrophils Relative %: 59 %
Platelets: 165 10*3/uL (ref 150–400)
RBC: 3.94 MIL/uL (ref 3.87–5.11)
RDW: 13.2 % (ref 11.5–15.5)
WBC: 6.4 10*3/uL (ref 4.0–10.5)
nRBC: 0 % (ref 0.0–0.2)

## 2020-06-05 LAB — TOTAL PROTEIN, URINE DIPSTICK: Protein, ur: 30 mg/dL — AB

## 2020-06-05 MED ORDER — SODIUM CHLORIDE 0.9% FLUSH
10.0000 mL | INTRAVENOUS | Status: DC | PRN
  Administered 2020-06-05: 10 mL
  Filled 2020-06-05: qty 10

## 2020-06-05 MED ORDER — SODIUM CHLORIDE 0.9 % IV SOLN
15.0000 mg/kg | Freq: Once | INTRAVENOUS | Status: AC
  Administered 2020-06-05: 1200 mg via INTRAVENOUS
  Filled 2020-06-05: qty 48

## 2020-06-05 MED ORDER — SODIUM CHLORIDE 0.9% FLUSH
10.0000 mL | Freq: Once | INTRAVENOUS | Status: AC
  Administered 2020-06-05: 10 mL
  Filled 2020-06-05: qty 10

## 2020-06-05 MED ORDER — SODIUM CHLORIDE 0.9 % IV SOLN
Freq: Once | INTRAVENOUS | Status: AC
  Filled 2020-06-05: qty 250

## 2020-06-05 MED ORDER — HEPARIN SOD (PORK) LOCK FLUSH 100 UNIT/ML IV SOLN
500.0000 [IU] | Freq: Once | INTRAVENOUS | Status: AC | PRN
  Administered 2020-06-05: 500 [IU]
  Filled 2020-06-05: qty 5

## 2020-06-05 NOTE — Patient Instructions (Signed)
[facility] Discharge Instructions for Patients Receiving Chemotherapy  Today you received the following chemotherapy agents Avastin.   To help prevent nausea and vomiting after your treatment, we encourage you to take your nausea medication as prescribed.    If you develop nausea and vomiting that is not controlled by your nausea medication, call the clinic.   BELOW ARE SYMPTOMS THAT SHOULD BE REPORTED IMMEDIATELY:  *FEVER GREATER THAN 100.5 F  *CHILLS WITH OR WITHOUT FEVER  NAUSEA AND VOMITING THAT IS NOT CONTROLLED WITH YOUR NAUSEA MEDICATION  *UNUSUAL SHORTNESS OF BREATH  *UNUSUAL BRUISING OR BLEEDING  TENDERNESS IN MOUTH AND THROAT WITH OR WITHOUT PRESENCE OF ULCERS  *URINARY PROBLEMS  *BOWEL PROBLEMS  UNUSUAL RASH Items with * indicate a potential emergency and should be followed up as soon as possible.  Feel free to call the clinic should you have any questions or concerns. The clinic phone number is [phone].  Please show the CHEMO ALERT CARD at check-in to the Emergency Department and triage nurse.   

## 2020-06-05 NOTE — Patient Instructions (Signed)
Implanted Port Insertion, Care After This sheet gives you information about how to care for yourself after your procedure. Your health care provider may also give you more specific instructions. If you have problems or questions, contact your health care provider. What can I expect after the procedure? After the procedure, it is common to have:  Discomfort at the port insertion site.  Bruising on the skin over the port. This should improve over 3-4 days. Follow these instructions at home: Port care  After your port is placed, you will get a manufacturer's information card. The card has information about your port. Keep this card with you at all times.  Take care of the port as told by your health care provider. Ask your health care provider if you or a family member can get training for taking care of the port at home. A home health care nurse may also take care of the port.  Make sure to remember what type of port you have. Incision care  Follow instructions from your health care provider about how to take care of your port insertion site. Make sure you: ? Wash your hands with soap and water before and after you change your bandage (dressing). If soap and water are not available, use hand sanitizer. ? Change your dressing as told by your health care provider. ? Leave stitches (sutures), skin glue, or adhesive strips in place. These skin closures may need to stay in place for 2 weeks or longer. If adhesive strip edges start to loosen and curl up, you may trim the loose edges. Do not remove adhesive strips completely unless your health care provider tells you to do that.  Check your port insertion site every day for signs of infection. Check for: ? Redness, swelling, or pain. ? Fluid or blood. ? Warmth. ? Pus or a bad smell.      Activity  Return to your normal activities as told by your health care provider. Ask your health care provider what activities are safe for you.  Do not  lift anything that is heavier than 10 lb (4.5 kg), or the limit that you are told, until your health care provider says that it is safe. General instructions  Take over-the-counter and prescription medicines only as told by your health care provider.  Do not take baths, swim, or use a hot tub until your health care provider approves. Ask your health care provider if you may take showers. You may only be allowed to take sponge baths.  Do not drive for 24 hours if you were given a sedative during your procedure.  Wear a medical alert bracelet in case of an emergency. This will tell any health care providers that you have a port.  Keep all follow-up visits as told by your health care provider. This is important. Contact a health care provider if:  You cannot flush your port with saline as directed, or you cannot draw blood from the port.  You have a fever or chills.  You have redness, swelling, or pain around your port insertion site.  You have fluid or blood coming from your port insertion site.  Your port insertion site feels warm to the touch.  You have pus or a bad smell coming from the port insertion site. Get help right away if:  You have chest pain or shortness of breath.  You have bleeding from your port that you cannot control. Summary  Take care of the port as told by your   health care provider. Keep the manufacturer's information card with you at all times.  Change your dressing as told by your health care provider.  Contact a health care provider if you have a fever or chills or if you have redness, swelling, or pain around your port insertion site.  Keep all follow-up visits as told by your health care provider. This information is not intended to replace advice given to you by your health care provider. Make sure you discuss any questions you have with your health care provider. Document Revised: 11/04/2017 Document Reviewed: 11/04/2017 [employer] Patient Education   2021 [employer]  

## 2020-06-06 LAB — CA 125: Cancer Antigen (CA) 125: 25.5 U/mL (ref 0.0–38.1)

## 2020-06-08 ENCOUNTER — Other Ambulatory Visit: Payer: Self-pay

## 2020-06-08 DIAGNOSIS — D849 Immunodeficiency, unspecified: Secondary | ICD-10-CM | POA: Diagnosis not present

## 2020-06-08 DIAGNOSIS — C5701 Malignant neoplasm of right fallopian tube: Secondary | ICD-10-CM | POA: Diagnosis not present

## 2020-06-08 DIAGNOSIS — N951 Menopausal and female climacteric states: Secondary | ICD-10-CM

## 2020-06-08 DIAGNOSIS — R059 Cough, unspecified: Secondary | ICD-10-CM | POA: Diagnosis not present

## 2020-06-08 MED ORDER — VENLAFAXINE HCL ER 37.5 MG PO CP24: ORAL_CAPSULE | ORAL | 3 refills | Status: DC

## 2020-06-16 ENCOUNTER — Telehealth: Payer: Self-pay

## 2020-06-16 NOTE — Telephone Encounter (Signed)
She called and ask for a call back. She was itching yesterday on her arms. She forgot to take her Claritin. She took benadryl yesterday and restarted the Claritin. Her arm areas she scratched, she now has bruising, it is better today. Instructed to apply Hydrocortisone cream to arms and her normal lotion. Ask her to call the office back if need. She verbalized understanding.

## 2020-06-23 ENCOUNTER — Ambulatory Visit (HOSPITAL_COMMUNITY)
Admission: RE | Admit: 2020-06-23 | Discharge: 2020-06-23 | Disposition: A | Discharge status: Home / Self Care | Payer: BC Managed Care – PPO | Source: Ambulatory Visit | Attending: Hematology and Oncology | Admitting: Hematology and Oncology

## 2020-06-23 ENCOUNTER — Encounter (HOSPITAL_COMMUNITY): Payer: Self-pay

## 2020-06-23 ENCOUNTER — Other Ambulatory Visit: Payer: Self-pay

## 2020-06-23 DIAGNOSIS — K429 Umbilical hernia without obstruction or gangrene: Secondary | ICD-10-CM | POA: Diagnosis not present

## 2020-06-23 DIAGNOSIS — C5701 Malignant neoplasm of right fallopian tube: Secondary | ICD-10-CM | POA: Insufficient documentation

## 2020-06-23 DIAGNOSIS — K439 Ventral hernia without obstruction or gangrene: Secondary | ICD-10-CM | POA: Diagnosis not present

## 2020-06-23 DIAGNOSIS — K449 Diaphragmatic hernia without obstruction or gangrene: Secondary | ICD-10-CM | POA: Diagnosis not present

## 2020-06-23 DIAGNOSIS — K469 Unspecified abdominal hernia without obstruction or gangrene: Secondary | ICD-10-CM | POA: Diagnosis not present

## 2020-06-23 MED ORDER — IOHEXOL 300 MG/ML  SOLN
100.0000 mL | Freq: Once | INTRAMUSCULAR | Status: AC | PRN
  Administered 2020-06-23: 100 mL via INTRAVENOUS

## 2020-06-26 ENCOUNTER — Inpatient Hospital Stay: Payer: BC Managed Care – PPO

## 2020-06-26 ENCOUNTER — Telehealth: Payer: Self-pay | Admitting: Hematology and Oncology

## 2020-06-26 ENCOUNTER — Inpatient Hospital Stay: Payer: BC Managed Care – PPO | Attending: Hematology and Oncology

## 2020-06-26 ENCOUNTER — Encounter: Payer: Self-pay | Admitting: Hematology and Oncology

## 2020-06-26 ENCOUNTER — Inpatient Hospital Stay (HOSPITAL_BASED_OUTPATIENT_CLINIC_OR_DEPARTMENT_OTHER): Payer: BC Managed Care – PPO | Admitting: Hematology and Oncology

## 2020-06-26 ENCOUNTER — Other Ambulatory Visit: Payer: Self-pay

## 2020-06-26 DIAGNOSIS — C569 Malignant neoplasm of unspecified ovary: Secondary | ICD-10-CM

## 2020-06-26 DIAGNOSIS — Z5112 Encounter for antineoplastic immunotherapy: Secondary | ICD-10-CM | POA: Diagnosis not present

## 2020-06-26 DIAGNOSIS — K469 Unspecified abdominal hernia without obstruction or gangrene: Secondary | ICD-10-CM | POA: Insufficient documentation

## 2020-06-26 DIAGNOSIS — K439 Ventral hernia without obstruction or gangrene: Secondary | ICD-10-CM

## 2020-06-26 DIAGNOSIS — R682 Dry mouth, unspecified: Secondary | ICD-10-CM | POA: Insufficient documentation

## 2020-06-26 DIAGNOSIS — I10 Essential (primary) hypertension: Secondary | ICD-10-CM | POA: Diagnosis not present

## 2020-06-26 DIAGNOSIS — C5701 Malignant neoplasm of right fallopian tube: Secondary | ICD-10-CM

## 2020-06-26 DIAGNOSIS — Z7189 Other specified counseling: Secondary | ICD-10-CM

## 2020-06-26 DIAGNOSIS — C786 Secondary malignant neoplasm of retroperitoneum and peritoneum: Secondary | ICD-10-CM | POA: Insufficient documentation

## 2020-06-26 DIAGNOSIS — C562 Malignant neoplasm of left ovary: Secondary | ICD-10-CM

## 2020-06-26 DIAGNOSIS — Z79899 Other long term (current) drug therapy: Secondary | ICD-10-CM | POA: Diagnosis not present

## 2020-06-26 DIAGNOSIS — K828 Other specified diseases of gallbladder: Secondary | ICD-10-CM | POA: Diagnosis not present

## 2020-06-26 LAB — TOTAL PROTEIN, URINE DIPSTICK: Protein, ur: NEGATIVE mg/dL

## 2020-06-26 LAB — CBC WITH DIFFERENTIAL/PLATELET
Abs Immature Granulocytes: 0.09 10*3/uL — ABNORMAL HIGH (ref 0.00–0.07)
Basophils Absolute: 0.1 10*3/uL (ref 0.0–0.1)
Basophils Relative: 1 %
Eosinophils Absolute: 0.5 10*3/uL (ref 0.0–0.5)
Eosinophils Relative: 9 %
HCT: 37.5 % (ref 36.0–46.0)
Hemoglobin: 11.9 g/dL — ABNORMAL LOW (ref 12.0–15.0)
Immature Granulocytes: 2 %
Lymphocytes Relative: 33 %
Lymphs Abs: 1.7 10*3/uL (ref 0.7–4.0)
MCH: 30.5 pg (ref 26.0–34.0)
MCHC: 31.7 g/dL (ref 30.0–36.0)
MCV: 96.2 fL (ref 80.0–100.0)
Monocytes Absolute: 0.8 10*3/uL (ref 0.1–1.0)
Monocytes Relative: 14 %
Neutro Abs: 2.2 10*3/uL (ref 1.7–7.7)
Neutrophils Relative %: 41 %
Platelets: 208 10*3/uL (ref 150–400)
RBC: 3.9 MIL/uL (ref 3.87–5.11)
RDW: 14.2 % (ref 11.5–15.5)
WBC: 5.3 10*3/uL (ref 4.0–10.5)
nRBC: 0 % (ref 0.0–0.2)

## 2020-06-26 LAB — COMPREHENSIVE METABOLIC PANEL
ALT: 16 U/L (ref 0–44)
AST: 15 U/L (ref 15–41)
Albumin: 3.4 g/dL — ABNORMAL LOW (ref 3.5–5.0)
Alkaline Phosphatase: 60 U/L (ref 38–126)
Anion gap: 8 (ref 5–15)
BUN: 12 mg/dL (ref 8–23)
CO2: 23 mmol/L (ref 22–32)
Calcium: 8.2 mg/dL — ABNORMAL LOW (ref 8.9–10.3)
Chloride: 107 mmol/L (ref 98–111)
Creatinine, Ser: 1.04 mg/dL — ABNORMAL HIGH (ref 0.44–1.00)
GFR, Estimated: 60 mL/min — ABNORMAL LOW (ref >60)
Glucose, Bld: 132 mg/dL — ABNORMAL HIGH (ref 70–99)
Potassium: 4.1 mmol/L (ref 3.5–5.1)
Sodium: 138 mmol/L (ref 135–145)
Total Bilirubin: 0.4 mg/dL (ref 0.3–1.2)
Total Protein: 6.4 g/dL — ABNORMAL LOW (ref 6.5–8.1)

## 2020-06-26 MED ORDER — SODIUM CHLORIDE 0.9 % IV SOLN
15.0000 mg/kg | Freq: Once | INTRAVENOUS | Status: AC
  Administered 2020-06-26: 1200 mg via INTRAVENOUS
  Filled 2020-06-26: qty 48

## 2020-06-26 MED ORDER — SODIUM CHLORIDE 0.9 % IV SOLN
Freq: Once | INTRAVENOUS | Status: DC
  Filled 2020-06-26: qty 250

## 2020-06-26 NOTE — Progress Notes (Signed)
[place] OFFICE PROGRESS NOTE  Patient Care Team: Gaynelle Arabian, MD as PCP - General (Family Medicine) Heath Lark, MD as Consulting Physician (Hematology and Oncology) Everitt Amber, MD as Consulting Physician (Obstetrics and Gynecology)  ASSESSMENT & PLAN:  Adenocarcinoma of right fallopian tube [facility]) I have reviewed her CT imaging with the patient and her husband The patient has no clinical signs to suggest cholecystitis Her liver enzymes are normal For now, the patient is not enthusiastic to pause treatment for surgery I recommend we proceed with bevacizumab as scheduled every 3 weeks I do not plan to repeat imaging study again until 4 months, next due in July  Essential hypertension Her blood pressure is well controlled She will continue current prescribed medicine  Thickening of wall of gallbladder She is not symptomatic I reviewed with the patient and her husband about signs and symptoms of cholecystitis I recommend the patient to modify her lifestyle and diet  Abdominal hernia without obstruction and without gangrene She is not symptomatic I recommend observation only The patient is not enthusiastic to pursue surgery at this point I recommend against lifting heavy items  Dry mouth She admits she is not drinking enough fluids I recommend the patient to try to monitor her oral fluid intake and goal of around 80 ounces of oral liquid per day   No orders of the defined types were placed in this encounter.   All questions were answered. The patient knows to call the clinic with any problems, questions or concerns. The total time spent in the appointment was 30 minutes encounter with patients including review of chart and various tests results, discussions about plan of care and coordination of care plan   Heath Lark, MD 06/26/2020 9:46 AM  INTERVAL HISTORY: Please see below for problem oriented charting. She returns with her husband for further  follow-up She is not symptomatic from her hernia or her gallbladder situation Denies abdominal pain, nausea or discomfort Her appetite is fair The only complaint is dry mouth The patient typically drink approximately 30 to 40 ounces of water only per day Her documented blood pressure at home is satisfactory  SUMMARY OF ONCOLOGIC HISTORY: Oncology History Overview Note  Neg genetics   Adenocarcinoma of right fallopian tube ([place])  08/12/2016 Initial Diagnosis   The patient has many months of vague symptoms of fullness in the pelvis, urinary frequency and difficulty with defecation. She denies vaginal bleeding or rectal bleeding.    08/12/2016 Imaging   Abdomen X-ray in the ER: Moderate colonic stool burden without evidence of enteric obstruction   11/13/2016 Imaging   TVUS was performed on 11/13/16 which showed a large hypoechoic lobular mass seen posterior to the uterus measuring 18.2x16.1x11.8cm, blood flow was seen along the periphery of the mass. It was considered to be likely to be a fibroid vs pelvic mass vs ovarian mass. Neither ovary was removed. Moderate amount of free fluid in the pelvis. THe uterus measures 4x10x4cm. The endometrium is 23m.    12/05/2016 Imaging   MR pelvis 1. Mild limitations as detailed above. 2. Heterogeneous pelvic mass is favored to arise from the right ovary. Favor a solid ovarian neoplasm such as fibroma/Brenner's tumor. Given lesion size and heterogeneous T2 signal out, complicating torsion cannot be excluded. 3. Moderate abdominopelvic ascites, without specific evidence of peritoneal metastasis. Consider further evaluation with contrast-enhanced abdominopelvic CT.    12/26/2016 Pathology Results   1. Uterus, ovaries and fallopian tubes - ADENOCARCINOMA INVOLVING RIGHT FALLOPIAN TUBE, RIGHT  AND LEFT OVARIES, UTERINE SEROSA AND PELVIC MASS. - SEE ONCOLOGY TABLE AND COMMENT. - UTERINE CERVIX, ENDOMETRIUM, MYOMETRIUM AND LEFT FALLOPIAN TUBE FREE OF  TUMOR. 2. Omentum, resection for tumor - ADENOCARCINOMA. Microscopic Comment 1. ONCOLOGY TABLE - FALLOPIAN TUBE. SEE COMMENT 1. Specimen, including laterality: Uterus, bilateral adnexa, pelvic mass and omentum. 2. Procedure: Hysterectomy with bilateral salpingo-oophorectomy, pelvic mass excision and omentectomy. 3. Lymph node sampling performed: No 4. Tumor site: See comment. 5. Tumor location in fallopian tube: Right fallopian tube fimbria 6. Specimen integrity (intact/ruptured/disrupted): Intact 7. Tumor size (cm): 18 cm, see comment. 8. Histologic type: Adenocarcinoma 9. Grade: High grade 10. Microscopic tumor extension: Tumor involves right fallopian tube, right and left ovaries, uterine serosa, pelvic mass and omentum. 11. Margins: See comment. 12. Lymph-Vascular invasion: Present 13. Lymph nodes: # examined: 0; # positive: N/A 14. TNM: pT3c, pNX 15. FIGO Stage (based on pathologic findings, needs clinical correlation: III-C 16. Comments: There is an 18 cm pelvic mass which is a high grade adenocarcinoma and there is a 12.2 cm  segment of omentum which is extensively involved with adenocarcinoma. The tumor also involves the fimbria of the right fallopian tube and the parenchyma of the right and left ovaries as well as uterine serosa. The fimbria of the right fallopian has intraepithelial atypia consistent with a precursor lesion and therefore this is most consistent with primary fallopian tube adenocarcinoma. A primary peritoneal serous carcinoma is a less likely possibility.    12/26/2016 Pathology Results   PERITONEAL/ASCITIC FLUID(SPECIMEN 1 OF 1 COLLECTED 12/26/16): POORLY DIFFERENTIATED ADENOCARCINOMA   12/26/2016 Surgery   Procedure(s) Performed: Exploratory laparotomy with total abdominal hysterectomy, bilateral salpingo-oophorectomy, omentectomy radical tumor debulking for ovarian cancer .  Surgeon: Thereasa Solo, MD.   Operative Findings: 20cm left ovarian mass densely  adherent to the posterior uterus, right tube and ovary, cervix, sigmoid colon. 10cm omental cake. 4L ascites. 66m size tumor nodules on the serosa of the terminal ileum and proximal sigmoid colon. 163mnodules on right diaphragm.    This represented an optimal cytoreduction (R1) with 76m27modules on intestine and diaphragm representing gross visible disease   01/16/2017 Procedure   Placement of single lumen port a cath via right internal jugular vein. The catheter tip lies at the cavo-atrial junction. A power injectable port a cath was placed and is ready for immediate use.   01/17/2017 Imaging   1. 3.5 x 7.2 x 4.6 cm fluid collection along the vaginal cuff, posterior the bladder and extending into the right adnexal space. This lesion demonstrates rim enhancement. Imaging features could be related to a loculated postoperative seroma or hematoma. Superinfection cannot be excluded by CT. Given debulking surgery was 3 weeks ago, this entire structure is un likely to represent neoplasm, but peritoneal involvement could have this appearance. 2. Small fluid collection in the left para colic gutter without rim enhancement. 3. Irregular/nodular appearance of the peritoneal M in the anatomic pelvis with areas of subtle nodularity between the stomach and the spleen. Close attention in these regions on follow-up recommended as metastatic disease is a concern. 4. Mildly enlarged hepatoduodenal ligament lymph node. Attention on follow-up recommended.   01/20/2017 Tumor Marker   Patient's tumor was tested for the following markers: CA-125 Results of the tumor marker test revealed 46.6   01/28/2017 Genetic Testing       Negative genetic testing on the [facility].  The [facility] panel offered by [employer] sequencing and deletion/duplication testing of the  following 28 genes: APC, ATM, BARD1, BMPR1A, BRCA1, BRCA2, BRIP1, CHD1, CDK4, CDKN2A, CHEK2, EPCAM (large rearrangement only),  MLH1, MSH2, MSH6, MUTYH, NBN, PALB2, PMS2, PTEN, RAD51C, RAD51D, SMAD4, STK11, and TP53. Sequencing was performed for select regions of POLE and POLD1, and large rearrangement analysis was performed for select regions of GREM1. The report date is January 28, 2017.  HRD testing looking for genomic instability and BRCA mutations was negative.  The report date of this test is January 27, 2017.    01/31/2017 Tumor Marker   Patient's tumor was tested for the following markers: CA-125 Results of the tumor marker test revealed 22.4   03/04/2017 Tumor Marker   Patient's tumor was tested for the following markers: CA-125 Results of the tumor marker test revealed 13.8   04/18/2017 Tumor Marker   Patient's tumor was tested for the following markers: CA-125 Results of the tumor marker test revealed 11.9   05/05/2017 Tumor Marker   Patient's tumor was tested for the following markers: CA-125 Results of the tumor marker test revealed 12   05/26/2017 Tumor Marker   Patient's tumor was tested for the following markers: CA-125 Results of the tumor marker test revealed 10.6   05/26/2017 Imaging   1. A previously noted postoperative fluid collection in the low pelvis and residual ascites seen on the prior examination has resolved on today's study. No findings to suggest residual/recurrent disease on today's examination. No definite solid organ metastasis identified in the abdomen or pelvis. No lymphadenopathy. 2. Aortic atherosclerosis.   06/10/2017 Procedure   Successful right IJ vein Port-A-Cath explant.   03/09/2018 Tumor Marker   Patient's tumor was tested for the following markers: CA-125 Results of the tumor marker test revealed 12.1   05/26/2018 Tumor Marker   Patient's tumor was tested for the following markers: CA-125 Results of the tumor marker test revealed 13.8   09/09/2018 Tumor Marker   Patient's tumor was tested for the following markers: CA-125 Results of the tumor marker test revealed  23.9   12/18/2018 Tumor Marker   Patient's tumor was tested for the following markers: CA-125 Results of the tumor marker test revealed 43.8   02/27/2019 - 02/28/2019 Hospital Admission   She was admitted for bowel obstruction   02/27/2019 Imaging   1. High-grade small bowel obstruction with transition point in the pelvis in an area of suspected desmoplastic reaction surrounding a 1.3 cm spiculated nodule in the mesentery, suspicious for carcinoid. There is hyperenhancement near the tip of the appendix and loss of the normal fat plane between it and the adjacent sigmoid colon, potentially the site of primary lesion. 2. Multiple new small subcentimeter hyperdense lesions scattered along the liver capsule, concerning for metastases. Enlarged hyperenhancing gastrohepatic and portacaval lymph nodes concerning for nodal metastases. 3. Very mild right hydroureteronephrosis to the level of the desmoplastic reaction in the pelvis, potentially involving the right ureter. 4. Infraumbilical ventral abdominal wall diastasis containing a small nondilated portion of transverse colon. 5. Trace ascites. 6. Cholelithiasis. 7.  Aortic atherosclerosis (ICD10-I70.0).     03/05/2019 Tumor Marker   Patient's tumor was tested for the following markers: CA-125 Results of the tumor marker test revealed 70.1   03/12/2019 Echocardiogram   IMPRESSIONS     1. Left ventricular ejection fraction, by visual estimation, is 60 to 65%. The left ventricle has normal function. There is no left ventricular hypertrophy.  2. Left ventricular diastolic parameters are consistent with Grade I diastolic dysfunction (impaired relaxation).  3. Global right ventricle  has normal systolic function.The right ventricular size is normal. No increase in right ventricular wall thickness.  4. Left atrial size was normal.  5. Right atrial size was normal.  6. The pericardium was not assessed.  7. The mitral valve is normal in structure. No  evidence of mitral valve regurgitation.  8. The tricuspid valve is normal in structure. Tricuspid valve regurgitation is trivial.  9. The aortic valve is normal in structure. Aortic valve regurgitation is not visualized. 10. The pulmonic valve was grossly normal. Pulmonic valve regurgitation is not visualized. 11. The average left ventricular global longitudinal strain is -17.6 %.   03/16/2019 Procedure   Successful placement of a right internal jugular approach power injectable Port-A-Cath. The catheter is ready for immediate use.     04/19/2019 Tumor Marker   Patient's tumor was tested for the following markers: CA-125 Results of the tumor marker test revealed 39.8   05/17/2019 Tumor Marker   Patient's tumor was tested for the following markers: CA-125 Results of the tumor marker test revealed 27   05/28/2019 Tumor Marker   Patient's tumor was tested for the following markers: CA-125 Results of the tumor marker test revealed 27.7.   06/11/2019 Imaging   1. No new or progressive metastatic disease in the abdomen or pelvis. 2. Tiny noncalcified perisplenic implant is decreased. Calcified pericardiophrenic, retroperitoneal and bilateral inguinal lymph nodes and scattered small calcified perihepatic and perisplenic implants are stable. 3.  Aortic Atherosclerosis (ICD10-I70.0).       06/17/2019 Echocardiogram    1. Left ventricular ejection fraction, by estimation, is 65 to 70%. The left ventricle has normal function. The left ventricle has no regional wall motion abnormalities. Left ventricular diastolic parameters were normal.  2. Right ventricular systolic function is normal. The right ventricular size is normal.  3. The mitral valve is normal in structure and function. No evidence of mitral valve regurgitation. No evidence of mitral stenosis.  4. The aortic valve is normal in structure and function. Aortic valve regurgitation is not visualized. No aortic stenosis is present.   06/21/2019  Tumor Marker   Patient's tumor was tested for the following markers: CA-125 Results of the tumor marker test revealed 18.7   07/19/2019 Tumor Marker   Patient's tumor was tested for the following markers: CA-125 Results of the tumor marker test revealed 16.7   08/30/2019 Tumor Marker   Patient's tumor was tested for the following markers: CA-125. Results of the tumor marker test revealed 13.9   09/15/2019 Echocardiogram    1. Left ventricular ejection fraction, by estimation, is 65 to 70%. The left ventricle has normal function. The left ventricle has no regional wall motion abnormalities. Left ventricular diastolic parameters are consistent with Grade I diastolic dysfunction (impaired relaxation). The average left ventricular global longitudinal strain is 18.1 %. The global longitudinal strain is normal.  2. Right ventricular systolic function is normal. The right ventricular size is normal.  3. The mitral valve is normal in structure. No evidence of mitral valve regurgitation. No evidence of mitral stenosis.  4. The aortic valve is normal in structure. Aortic valve regurgitation is not visualized. No aortic stenosis is present.  5. The inferior vena cava is normal in size with greater than 50% respiratory variability, suggesting right atrial pressure of 3 mmHg.     09/24/2019 Imaging   1. Stable exam. No new or progressive metastatic disease on today's study. 2. Small subcapsular hypodensity in the lateral spleen, new in the interval, but indeterminate. Attention  on follow-up recommended. 3. The calcified upper abdominal and groin lymph nodes are stable as are the scattered calcified and noncalcified peritoneal implants. 4. Stable midline ventral hernia containing a short segment of colon without complicating features. 5. Aortic Atherosclerosis (ICD10-I70.0).     10/04/2019 -  Chemotherapy    Patient is on Treatment Plan: OVARIAN BEVACIZUMAB Q21D      10/04/2019 Tumor Marker   Patient's  tumor was tested for the following markers: CA-125. Results of the tumor marker test revealed 12.9.   11/01/2019 Tumor Marker   Patient's tumor was tested for the following markers: CA-125 Results of the tumor marker test revealed 15.4   12/13/2019 Tumor Marker   Patient's tumor was tested for the following markers: CA-125 Results of the tumor marker test revealed 14.8   12/31/2019 Imaging   1. Unchanged tiny peritoneal nodule adjacent to the spleen measuring 6 mm. Other previously noted peritoneal nodules are imperceptible on present examination. 2. Unchanged prominent, partially calcified portacaval lymph node and bilateral inguinal lymph nodes. 3. Unchanged subtle, nonspecific subcapsular lesion of the spleen.  4. No evidence of new metastatic disease in the abdomen or pelvis. 5. Status post hysterectomy. 6. Unchanged low midline ventral abdominal hernia containing a single loop of nonobstructed transverse colon. 7. Cholelithiasis. 8. Aortic Atherosclerosis (ICD10-I70.0).       01/03/2020 Tumor Marker   Patient's tumor was tested for the following markers: CA-125 Results of the tumor marker test revealed 11.7.   01/24/2020 Tumor Marker   Patient's tumor was tested for the following markers: CA-125. Results of the tumor marker test revealed 15.1   03/06/2020 Tumor Marker   Patient's tumor was tested for the following markers: CA-125. Results of the tumor marker test revealed 20.3   03/27/2020 Tumor Marker   Patient's tumor was tested for the following markers: CA-125 Results of the tumor marker test revealed 23.7   05/11/2020 Tumor Marker   Patient's tumor was tested for the following markers: CA-125. Results of the tumor marker test revealed 25.4   06/23/2020 Imaging   1. Cholelithiasis with gallbladder wall thickening and mild infiltrative edema in the porta hepatis, raising the possibility of acute cholecystitis. There is truncation of the common bile duct distally and distal  choledocholithiasis is difficult to exclude. Correlate with bilirubin levels. If clinically warranted, MRCP could be utilized for further characterization. 2. Stable tiny perisplenic nodule. 3. Other imaging findings of potential clinical significance: Small type 1 hiatal hernia. Prominent stool throughout the colon favors constipation. Ventral infraumbilical hernia contains transverse colon without findings of strangulation or obstruction. Small supraumbilical hernia contains adipose tissue. Lumbar degenerative disc disease at L5-S1. Stable tiny nodule along the lateral margin of the spleen, nonspecific. 4. Aortic atherosclerosis.       REVIEW OF SYSTEMS:   Constitutional: Denies fevers, chills or abnormal weight loss Eyes: Denies blurriness of vision Ears, nose, mouth, throat, and face: Denies mucositis or sore throat Respiratory: Denies cough, dyspnea or wheezes Cardiovascular: Denies palpitation, chest discomfort or lower extremity swelling Gastrointestinal:  Denies nausea, heartburn or change in bowel habits Skin: Denies abnormal skin rashes Lymphatics: Denies new lymphadenopathy or easy bruising Neurological:Denies numbness, tingling or new weaknesses Behavioral/Psych: Mood is stable, no new changes  All other systems were reviewed with the patient and are negative.  I have reviewed the past medical history, past surgical history, social history and family history with the patient and they are unchanged from previous note.  ALLERGIES:  is allergic to dilaudid [hydromorphone hcl],  morphine and related, and sudafed [pseudoephedrine hcl].  MEDICATIONS:  Current Outpatient Medications  Medication Sig Dispense Refill  . acetaminophen (TYLENOL) 325 MG tablet Take 975 mg by mouth every 6 (six) hours as needed (pain).    Marland Kitchen amLODipine (NORVASC) 10 MG tablet Take 1 tablet (10 mg total) by mouth daily. 30 tablet 9  . lidocaine-prilocaine (EMLA) cream APP EXT AA 1 TIME    . ondansetron  (ZOFRAN) 8 MG tablet Take 1 tablet (8 mg total) by mouth every 8 (eight) hours as needed. 30 tablet 1  . prochlorperazine (COMPAZINE) 10 MG tablet TAKE 1 TABLET BY MOUTH EVERY 6 HOURS AS NEEDED FOR NAUSEA AND/OR VOMITING 30 tablet 1  . venlafaxine XR (EFFEXOR-XR) 37.5 MG 24 hr capsule TAKE 1 CAPSULE BY MOUTH DAILY WITH BREAKFAST 90 capsule 3   No current facility-administered medications for this visit.   Facility-Administered Medications Ordered in Other Visits  Medication Dose Route Frequency Provider Last Rate Last Admin  . 0.9 %  sodium chloride infusion   Intravenous Once Alvy Bimler, Ni, MD      . bevacizumab-bvzr (ZIRABEV) 1,200 mg in sodium chloride 0.9 % 100 mL chemo infusion  15 mg/kg (Treatment Plan Recorded) Intravenous Once Heath Lark, MD 296 mL/hr at 06/26/20 0942 1,200 mg at 06/26/20 0942  . sodium chloride flush (NS) 0.9 % injection 10 mL  10 mL Intravenous PRN Heath Lark, MD   10 mL at 02/11/17 0839  . sodium chloride flush (NS) 0.9 % injection 10 mL  10 mL Intravenous PRN Alvy Bimler, Ni, MD   10 mL at 03/04/17 1510    PHYSICAL EXAMINATION: ECOG PERFORMANCE STATUS: 1 - Symptomatic but completely ambulatory  Vitals:   06/26/20 0842  BP: 136/63  Pulse: 73  Resp: 18  Temp: (!) 97.2 F (36.2 C)  SpO2: 100%   Filed Weights   06/26/20 0842  Weight: 174 lb 3.2 oz (79 kg)    GENERAL:alert, no distress and comfortable Musculoskeletal:no cyanosis of digits and no clubbing  NEURO: alert & oriented x 3 with fluent speech, no focal motor/sensory deficits  LABORATORY DATA:  I have reviewed the data as listed    Component Value Date/Time   NA 138 06/26/2020 0830   NA 139 04/14/2017 0742   K 4.1 06/26/2020 0830   K 4.0 04/14/2017 0742   CL 107 06/26/2020 0830   CO2 23 06/26/2020 0830   CO2 21 (L) 04/14/2017 0742   GLUCOSE 132 (H) 06/26/2020 0830   GLUCOSE 247 (H) 04/14/2017 0742   BUN 12 06/26/2020 0830   BUN 13.1 04/14/2017 0742   CREATININE 1.04 (H) 06/26/2020 0830    CREATININE 0.97 04/17/2020 0812   CREATININE 0.9 04/14/2017 0742   CALCIUM 8.2 (L) 06/26/2020 0830   CALCIUM 9.2 04/14/2017 0742   PROT 6.4 (L) 06/26/2020 0830   PROT 7.3 04/14/2017 0742   ALBUMIN 3.4 (L) 06/26/2020 0830   ALBUMIN 3.9 04/14/2017 0742   AST 15 06/26/2020 0830   AST 18 04/17/2020 0812   AST 17 04/14/2017 0742   ALT 16 06/26/2020 0830   ALT 11 04/17/2020 0812   ALT 14 04/14/2017 0742   ALKPHOS 60 06/26/2020 0830   ALKPHOS 70 04/14/2017 0742   BILITOT 0.4 06/26/2020 0830   BILITOT 0.3 04/17/2020 0812   BILITOT 0.37 04/14/2017 0742   GFRNONAA 60 (L) 06/26/2020 0830   GFRNONAA >60 04/17/2020 0812   GFRAA >60 01/24/2020 0924   GFRAA >60 07/19/2019 0951    No results found  for: SPEP, UPEP  Lab Results  Component Value Date   WBC 5.3 06/26/2020   NEUTROABS 2.2 06/26/2020   HGB 11.9 (L) 06/26/2020   HCT 37.5 06/26/2020   MCV 96.2 06/26/2020   PLT 208 06/26/2020      Chemistry      Component Value Date/Time   NA 138 06/26/2020 0830   NA 139 04/14/2017 0742   K 4.1 06/26/2020 0830   K 4.0 04/14/2017 0742   CL 107 06/26/2020 0830   CO2 23 06/26/2020 0830   CO2 21 (L) 04/14/2017 0742   BUN 12 06/26/2020 0830   BUN 13.1 04/14/2017 0742   CREATININE 1.04 (H) 06/26/2020 0830   CREATININE 0.97 04/17/2020 0812   CREATININE 0.9 04/14/2017 0742      Component Value Date/Time   CALCIUM 8.2 (L) 06/26/2020 0830   CALCIUM 9.2 04/14/2017 0742   ALKPHOS 60 06/26/2020 0830   ALKPHOS 70 04/14/2017 0742   AST 15 06/26/2020 0830   AST 18 04/17/2020 0812   AST 17 04/14/2017 0742   ALT 16 06/26/2020 0830   ALT 11 04/17/2020 0812   ALT 14 04/14/2017 0742   BILITOT 0.4 06/26/2020 0830   BILITOT 0.3 04/17/2020 0812   BILITOT 0.37 04/14/2017 0742       RADIOGRAPHIC STUDIES: I have reviewed CT imaging with the patient and her husband I have personally reviewed the radiological images as listed and agreed with the findings in the report. CT ABDOMEN PELVIS W  CONTRAST  Result Date: 06/23/2020 CLINICAL DATA:  Recurrent ovarian cancer. Prior chemotherapy. Hysterectomy. EXAM: CT ABDOMEN AND PELVIS WITH CONTRAST TECHNIQUE: Multidetector CT imaging of the abdomen and pelvis was performed using the standard protocol following bolus administration of intravenous contrast. CONTRAST:  139m OMNIPAQUE IOHEXOL 300 MG/ML  SOLN COMPARISON:  12/31/2019 FINDINGS: Lower chest: Descending thoracic aortic atherosclerotic calcification. Small type 1 hiatal hernia. Hepatobiliary: Cholelithiasis noted. Gallbladder wall thickening with single wall thickness at about 0.5 cm. Mild infiltrative edema in the porta hepatis. There is truncation of the common bile duct distally on image 46 series 5 and distal choledocholithiasis is difficult to exclude. The distal common bile duct measures about 6 mm in diameter, upper limits of normal for patient age, was previously about 4 mm in diameter. Pancreas: Unremarkable Spleen: Stable tiny nodule along the lateral margin of the spleen about 0.7 by 0.3 cm on image 27 series 2. Near this, there is a slight scalloped margin of the spleen with adjacent hypodensity, possibly from some localized scarring. Adrenals/Urinary Tract: Tiny cyst of the left kidney lower pole. Low I is a unremarkable. The adrenal glands appear normal. Stomach/Bowel: Postoperative findings from fundoplication the appendix appears unremarkable. Prominent stool throughout the colon favors constipation. Ventral infraumbilical hernia contains transverse colon without findings of strangulation or obstruction. Vascular/Lymphatic: Aortoiliac atherosclerotic vascular disease. Small periaortic lymph nodes are not pathologically enlarged. Left inguinal node 1.2 cm in short axis on image 82 series 2, previously 1.1 cm. Right inguinal node 1.1 cm in short axis on image 83 series 2, previously the same. No overt pathologic inguinal adenopathy. Reproductive: Uterus absent.  Adnexa unremarkable.  Other: No supplemental non-categorized findings. Musculoskeletal: Small supraumbilical hernia contains adipose tissue and appears unchanged. Infraumbilical hernia contains transverse colon, similar to the prior exam. No findings of osseous metastatic disease. Lumbar degenerative disc disease at L5-S1. IMPRESSION: 1. Cholelithiasis with gallbladder wall thickening and mild infiltrative edema in the porta hepatis, raising the possibility of acute cholecystitis. There is truncation of  the common bile duct distally and distal choledocholithiasis is difficult to exclude. Correlate with bilirubin levels. If clinically warranted, MRCP could be utilized for further characterization. 2. Stable tiny perisplenic nodule. 3. Other imaging findings of potential clinical significance: Small type 1 hiatal hernia. Prominent stool throughout the colon favors constipation. Ventral infraumbilical hernia contains transverse colon without findings of strangulation or obstruction. Small supraumbilical hernia contains adipose tissue. Lumbar degenerative disc disease at L5-S1. Stable tiny nodule along the lateral margin of the spleen, nonspecific. 4. Aortic atherosclerosis. Aortic Atherosclerosis (ICD10-I70.0). Electronically Signed   By: Van Clines M.D.   On: 06/23/2020 16:51

## 2020-06-26 NOTE — Assessment & Plan Note (Signed)
She admits she is not drinking enough fluids I recommend the patient to try to monitor her oral fluid intake and goal of around 80 ounces of oral liquid per day

## 2020-06-26 NOTE — Assessment & Plan Note (Signed)
She is not symptomatic I recommend observation only The patient is not enthusiastic to pursue surgery at this point I recommend against lifting heavy items

## 2020-06-26 NOTE — Telephone Encounter (Signed)
Scheduled appts per 3/7 sch msg. Pt stated she would refer to mychart for AVS and appts.

## 2020-06-26 NOTE — Progress Notes (Signed)
Pt discharged in no apparent distress. Pt left ambulatory without assistance. Pt aware of discharge instructions and verbalized understanding and had no further questions.  

## 2020-06-26 NOTE — Assessment & Plan Note (Signed)
Her blood pressure is well controlled She will continue current prescribed medicine

## 2020-06-26 NOTE — Assessment & Plan Note (Signed)
I have reviewed her CT imaging with the patient and her husband The patient has no clinical signs to suggest cholecystitis Her liver enzymes are normal For now, the patient is not enthusiastic to pause treatment for surgery I recommend we proceed with bevacizumab as scheduled every 3 weeks I do not plan to repeat imaging study again until 4 months, next due in July

## 2020-06-26 NOTE — Assessment & Plan Note (Signed)
She is not symptomatic I reviewed with the patient and her husband about signs and symptoms of cholecystitis I recommend the patient to modify her lifestyle and diet

## 2020-06-27 LAB — CA 125: Cancer Antigen (CA) 125: 26.2 U/mL (ref 0.0–38.1)

## 2020-07-13 ENCOUNTER — Other Ambulatory Visit: Payer: Self-pay | Admitting: Hematology and Oncology

## 2020-07-17 ENCOUNTER — Inpatient Hospital Stay: Payer: BC Managed Care – PPO

## 2020-07-17 ENCOUNTER — Other Ambulatory Visit: Payer: Self-pay

## 2020-07-17 VITALS — BP 147/71 | HR 72 | Temp 97.8°F | Resp 18 | Wt 172.8 lb

## 2020-07-17 DIAGNOSIS — Z5112 Encounter for antineoplastic immunotherapy: Secondary | ICD-10-CM | POA: Diagnosis not present

## 2020-07-17 DIAGNOSIS — C5701 Malignant neoplasm of right fallopian tube: Secondary | ICD-10-CM

## 2020-07-17 DIAGNOSIS — C786 Secondary malignant neoplasm of retroperitoneum and peritoneum: Secondary | ICD-10-CM | POA: Diagnosis not present

## 2020-07-17 DIAGNOSIS — C562 Malignant neoplasm of left ovary: Secondary | ICD-10-CM

## 2020-07-17 DIAGNOSIS — C569 Malignant neoplasm of unspecified ovary: Secondary | ICD-10-CM

## 2020-07-17 DIAGNOSIS — Z7189 Other specified counseling: Secondary | ICD-10-CM

## 2020-07-17 DIAGNOSIS — Z79899 Other long term (current) drug therapy: Secondary | ICD-10-CM | POA: Diagnosis not present

## 2020-07-17 LAB — CBC WITH DIFFERENTIAL/PLATELET
Abs Immature Granulocytes: 0.04 10*3/uL (ref 0.00–0.07)
Basophils Absolute: 0.1 10*3/uL (ref 0.0–0.1)
Basophils Relative: 1 %
Eosinophils Absolute: 0.7 10*3/uL — ABNORMAL HIGH (ref 0.0–0.5)
Eosinophils Relative: 11 %
HCT: 39.3 % (ref 36.0–46.0)
Hemoglobin: 12.8 g/dL (ref 12.0–15.0)
Immature Granulocytes: 1 %
Lymphocytes Relative: 26 %
Lymphs Abs: 1.7 10*3/uL (ref 0.7–4.0)
MCH: 30.8 pg (ref 26.0–34.0)
MCHC: 32.6 g/dL (ref 30.0–36.0)
MCV: 94.7 fL (ref 80.0–100.0)
Monocytes Absolute: 0.6 10*3/uL (ref 0.1–1.0)
Monocytes Relative: 9 %
Neutro Abs: 3.5 10*3/uL (ref 1.7–7.7)
Neutrophils Relative %: 52 %
Platelets: 195 10*3/uL (ref 150–400)
RBC: 4.15 MIL/uL (ref 3.87–5.11)
RDW: 14.5 % (ref 11.5–15.5)
WBC: 6.6 10*3/uL (ref 4.0–10.5)
nRBC: 0 % (ref 0.0–0.2)

## 2020-07-17 LAB — COMPREHENSIVE METABOLIC PANEL
ALT: 13 U/L (ref 0–44)
AST: 15 U/L (ref 15–41)
Albumin: 3.6 g/dL (ref 3.5–5.0)
Alkaline Phosphatase: 68 U/L (ref 38–126)
Anion gap: 11 (ref 5–15)
BUN: 17 mg/dL (ref 8–23)
CO2: 22 mmol/L (ref 22–32)
Calcium: 8.7 mg/dL — ABNORMAL LOW (ref 8.9–10.3)
Chloride: 106 mmol/L (ref 98–111)
Creatinine, Ser: 1.09 mg/dL — ABNORMAL HIGH (ref 0.44–1.00)
GFR, Estimated: 56 mL/min — ABNORMAL LOW (ref >60)
Glucose, Bld: 138 mg/dL — ABNORMAL HIGH (ref 70–99)
Potassium: 3.9 mmol/L (ref 3.5–5.1)
Sodium: 139 mmol/L (ref 135–145)
Total Bilirubin: 0.5 mg/dL (ref 0.3–1.2)
Total Protein: 6.8 g/dL (ref 6.5–8.1)

## 2020-07-17 LAB — TOTAL PROTEIN, URINE DIPSTICK: Protein, ur: 30 mg/dL — AB

## 2020-07-17 MED ORDER — SODIUM CHLORIDE 0.9% FLUSH
10.0000 mL | INTRAVENOUS | Status: DC | PRN
  Administered 2020-07-17: 10 mL
  Filled 2020-07-17: qty 10

## 2020-07-17 MED ORDER — SODIUM CHLORIDE 0.9 % IV SOLN
15.0000 mg/kg | Freq: Once | INTRAVENOUS | Status: AC
  Administered 2020-07-17: 1200 mg via INTRAVENOUS
  Filled 2020-07-17: qty 48

## 2020-07-17 MED ORDER — SODIUM CHLORIDE 0.9 % IV SOLN
Freq: Once | INTRAVENOUS | Status: AC
  Filled 2020-07-17: qty 250

## 2020-07-17 MED ORDER — HEPARIN SOD (PORK) LOCK FLUSH 100 UNIT/ML IV SOLN
500.0000 [IU] | Freq: Once | INTRAVENOUS | Status: AC | PRN
  Administered 2020-07-17: 500 [IU]
  Filled 2020-07-17: qty 5

## 2020-07-17 NOTE — Patient Instructions (Signed)
[facility] Discharge Instructions for Patients Receiving Chemotherapy  Today you received the following chemotherapy agents Bevacizumab(Zirabev)  To help prevent nausea and vomiting after your treatment, we encourage you to take your nausea medication as directed.  If you develop nausea and vomiting that is not controlled by your nausea medication, call the clinic.   BELOW ARE SYMPTOMS THAT SHOULD BE REPORTED IMMEDIATELY:  *FEVER GREATER THAN 100.5 F  *CHILLS WITH OR WITHOUT FEVER  NAUSEA AND VOMITING THAT IS NOT CONTROLLED WITH YOUR NAUSEA MEDICATION  *UNUSUAL SHORTNESS OF BREATH  *UNUSUAL BRUISING OR BLEEDING  TENDERNESS IN MOUTH AND THROAT WITH OR WITHOUT PRESENCE OF ULCERS  *URINARY PROBLEMS  *BOWEL PROBLEMS  UNUSUAL RASH Items with * indicate a potential emergency and should be followed up as soon as possible.  Feel free to call the clinic should you have any questions or concerns. The clinic phone number is [phone].  Please show the [facility] at check-in to the Emergency Department and triage nurse.

## 2020-08-07 ENCOUNTER — Inpatient Hospital Stay: Payer: BC Managed Care – PPO

## 2020-08-07 ENCOUNTER — Other Ambulatory Visit: Payer: Self-pay

## 2020-08-07 ENCOUNTER — Encounter: Payer: Self-pay | Admitting: Hematology and Oncology

## 2020-08-07 ENCOUNTER — Inpatient Hospital Stay: Payer: BC Managed Care – PPO | Attending: Hematology and Oncology

## 2020-08-07 ENCOUNTER — Inpatient Hospital Stay (HOSPITAL_BASED_OUTPATIENT_CLINIC_OR_DEPARTMENT_OTHER): Payer: BC Managed Care – PPO | Admitting: Hematology and Oncology

## 2020-08-07 VITALS — BP 135/69 | HR 75 | Resp 18 | Ht 65.0 in | Wt 171.8 lb

## 2020-08-07 VITALS — Temp 97.4°F

## 2020-08-07 DIAGNOSIS — Z7189 Other specified counseling: Secondary | ICD-10-CM

## 2020-08-07 DIAGNOSIS — R7989 Other specified abnormal findings of blood chemistry: Secondary | ICD-10-CM

## 2020-08-07 DIAGNOSIS — Z79899 Other long term (current) drug therapy: Secondary | ICD-10-CM | POA: Insufficient documentation

## 2020-08-07 DIAGNOSIS — C5701 Malignant neoplasm of right fallopian tube: Secondary | ICD-10-CM

## 2020-08-07 DIAGNOSIS — C569 Malignant neoplasm of unspecified ovary: Secondary | ICD-10-CM

## 2020-08-07 DIAGNOSIS — Z5112 Encounter for antineoplastic immunotherapy: Secondary | ICD-10-CM | POA: Insufficient documentation

## 2020-08-07 DIAGNOSIS — C562 Malignant neoplasm of left ovary: Secondary | ICD-10-CM

## 2020-08-07 DIAGNOSIS — I10 Essential (primary) hypertension: Secondary | ICD-10-CM

## 2020-08-07 LAB — COMPREHENSIVE METABOLIC PANEL
ALT: 9 U/L (ref 0–44)
AST: 16 U/L (ref 15–41)
Albumin: 3.6 g/dL (ref 3.5–5.0)
Alkaline Phosphatase: 60 U/L (ref 38–126)
Anion gap: 11 (ref 5–15)
BUN: 20 mg/dL (ref 8–23)
CO2: 24 mmol/L (ref 22–32)
Calcium: 8.9 mg/dL (ref 8.9–10.3)
Chloride: 105 mmol/L (ref 98–111)
Creatinine, Ser: 1.12 mg/dL — ABNORMAL HIGH (ref 0.44–1.00)
GFR, Estimated: 55 mL/min — ABNORMAL LOW (ref >60)
Glucose, Bld: 123 mg/dL — ABNORMAL HIGH (ref 70–99)
Potassium: 4.1 mmol/L (ref 3.5–5.1)
Sodium: 140 mmol/L (ref 135–145)
Total Bilirubin: 0.5 mg/dL (ref 0.3–1.2)
Total Protein: 6.5 g/dL (ref 6.5–8.1)

## 2020-08-07 LAB — CBC WITH DIFFERENTIAL/PLATELET
Abs Immature Granulocytes: 0.01 10*3/uL (ref 0.00–0.07)
Basophils Absolute: 0.1 10*3/uL (ref 0.0–0.1)
Basophils Relative: 1 %
Eosinophils Absolute: 0.5 10*3/uL (ref 0.0–0.5)
Eosinophils Relative: 8 %
HCT: 39.5 % (ref 36.0–46.0)
Hemoglobin: 12.8 g/dL (ref 12.0–15.0)
Immature Granulocytes: 0 %
Lymphocytes Relative: 24 %
Lymphs Abs: 1.6 10*3/uL (ref 0.7–4.0)
MCH: 30.9 pg (ref 26.0–34.0)
MCHC: 32.4 g/dL (ref 30.0–36.0)
MCV: 95.4 fL (ref 80.0–100.0)
Monocytes Absolute: 0.5 10*3/uL (ref 0.1–1.0)
Monocytes Relative: 8 %
Neutro Abs: 4 10*3/uL (ref 1.7–7.7)
Neutrophils Relative %: 59 %
Platelets: 169 10*3/uL (ref 150–400)
RBC: 4.14 MIL/uL (ref 3.87–5.11)
RDW: 14.2 % (ref 11.5–15.5)
WBC: 6.7 10*3/uL (ref 4.0–10.5)
nRBC: 0 % (ref 0.0–0.2)

## 2020-08-07 LAB — TOTAL PROTEIN, URINE DIPSTICK: Protein, ur: 30 mg/dL — AB

## 2020-08-07 MED ORDER — SODIUM CHLORIDE 0.9% FLUSH
10.0000 mL | INTRAVENOUS | Status: DC | PRN
  Administered 2020-08-07: 10 mL
  Filled 2020-08-07: qty 10

## 2020-08-07 MED ORDER — HEPARIN SOD (PORK) LOCK FLUSH 100 UNIT/ML IV SOLN
500.0000 [IU] | Freq: Once | INTRAVENOUS | Status: AC | PRN
  Administered 2020-08-07: 500 [IU]
  Filled 2020-08-07: qty 5

## 2020-08-07 MED ORDER — SODIUM CHLORIDE 0.9 % IV SOLN
15.0000 mg/kg | Freq: Once | INTRAVENOUS | Status: AC
  Administered 2020-08-07: 1200 mg via INTRAVENOUS
  Filled 2020-08-07: qty 48

## 2020-08-07 MED ORDER — ATENOLOL 25 MG PO TABS: 25.0000 mg | ORAL_TABLET | Freq: Every day | ORAL | 3 refills | Status: DC

## 2020-08-07 MED ORDER — SODIUM CHLORIDE 0.9 % IV SOLN
Freq: Once | INTRAVENOUS | Status: AC
Start: 2020-08-07 — End: 2020-08-07
  Filled 2020-08-07: qty 250

## 2020-08-07 NOTE — Patient Instructions (Signed)
[facility] Discharge Instructions for Patients Receiving Chemotherapy  Today you received the following chemotherapy agents Avastin  To help prevent nausea and vomiting after your treatment, we encourage you to take your nausea medication as directed   If you develop nausea and vomiting that is not controlled by your nausea medication, call the clinic.   BELOW ARE SYMPTOMS THAT SHOULD BE REPORTED IMMEDIATELY:  *FEVER GREATER THAN 100.5 F  *CHILLS WITH OR WITHOUT FEVER  NAUSEA AND VOMITING THAT IS NOT CONTROLLED WITH YOUR NAUSEA MEDICATION  *UNUSUAL SHORTNESS OF BREATH  *UNUSUAL BRUISING OR BLEEDING  TENDERNESS IN MOUTH AND THROAT WITH OR WITHOUT PRESENCE OF ULCERS  *URINARY PROBLEMS  *BOWEL PROBLEMS  UNUSUAL RASH Items with * indicate a potential emergency and should be followed up as soon as possible.  Feel free to call the clinic should you have any questions or concerns. The clinic phone number is [phone].  Please show the CHEMO ALERT CARD at check-in to the Emergency Department and triage nurse.   

## 2020-08-07 NOTE — Assessment & Plan Note (Signed)
She has intermittent elevated serum creatinine Observe closely for now

## 2020-08-07 NOTE — Progress Notes (Signed)
[facility] OFFICE PROGRESS NOTE  Patient Care Team: Gaynelle Arabian, MD as PCP - General (Family Medicine) Heath Lark, MD as Consulting Physician (Hematology and Oncology) Everitt Amber, MD as Consulting Physician (Obstetrics and Gynecology)  ASSESSMENT & PLAN:  Adenocarcinoma of right fallopian tube [facility]) Her last CT imaging looks good Her tumor marker is not helpful so I will stop checking tumor markers I recommend we proceed with bevacizumab as scheduled every 3 weeks I do not plan to repeat imaging study again until 4 months, next due in July She is noted to have slight worsening hypertension We will treat that aggressively  Essential hypertension Her blood pressure is suboptimally controlled We will add additional blood pressure medication The patient is instructed to monitor carefully and to make lifestyle changes  Elevated serum creatinine She has intermittent elevated serum creatinine Observe closely for now   No orders of the defined types were placed in this encounter.   All questions were answered. The patient knows to call the clinic with any problems, questions or concerns. The total time spent in the appointment was 20 minutes encounter with patients including review of chart and various tests results, discussions about plan of care and coordination of care plan   Heath Lark, MD 08/07/2020 12:18 PM  INTERVAL HISTORY: Please see below for problem oriented charting. She returns with her husband for further follow-up She is doing well She has noted that her blood pressure is intermittently elevated She denies symptoms No abdominal pain or changes in bowel habits SUMMARY OF ONCOLOGIC HISTORY: Oncology History Overview Note  Neg genetics   Adenocarcinoma of right fallopian tube ([place])  08/12/2016 Initial Diagnosis   The patient has many months of vague symptoms of fullness in the pelvis, urinary frequency and difficulty with defecation. She denies  vaginal bleeding or rectal bleeding.    08/12/2016 Imaging   Abdomen X-ray in the ER: Moderate colonic stool burden without evidence of enteric obstruction   11/13/2016 Imaging   TVUS was performed on 11/13/16 which showed a large hypoechoic lobular mass seen posterior to the uterus measuring 18.2x16.1x11.8cm, blood flow was seen along the periphery of the mass. It was considered to be likely to be a fibroid vs pelvic mass vs ovarian mass. Neither ovary was removed. Moderate amount of free fluid in the pelvis. THe uterus measures 4x10x4cm. The endometrium is 73m.    12/05/2016 Imaging   MR pelvis 1. Mild limitations as detailed above. 2. Heterogeneous pelvic mass is favored to arise from the right ovary. Favor a solid ovarian neoplasm such as fibroma/Brenner's tumor. Given lesion size and heterogeneous T2 signal out, complicating torsion cannot be excluded. 3. Moderate abdominopelvic ascites, without specific evidence of peritoneal metastasis. Consider further evaluation with contrast-enhanced abdominopelvic CT.    12/26/2016 Pathology Results   1. Uterus, ovaries and fallopian tubes - ADENOCARCINOMA INVOLVING RIGHT FALLOPIAN TUBE, RIGHT AND LEFT OVARIES, UTERINE SEROSA AND PELVIC MASS. - SEE ONCOLOGY TABLE AND COMMENT. - UTERINE CERVIX, ENDOMETRIUM, MYOMETRIUM AND LEFT FALLOPIAN TUBE FREE OF TUMOR. 2. Omentum, resection for tumor - ADENOCARCINOMA. Microscopic Comment 1. ONCOLOGY TABLE - FALLOPIAN TUBE. SEE COMMENT 1. Specimen, including laterality: Uterus, bilateral adnexa, pelvic mass and omentum. 2. Procedure: Hysterectomy with bilateral salpingo-oophorectomy, pelvic mass excision and omentectomy. 3. Lymph node sampling performed: No 4. Tumor site: See comment. 5. Tumor location in fallopian tube: Right fallopian tube fimbria 6. Specimen integrity (intact/ruptured/disrupted): Intact 7. Tumor size (cm): 18 cm, see comment. 8. Histologic type: Adenocarcinoma 9. Grade:  High grade 10.  Microscopic tumor extension: Tumor involves right fallopian tube, right and left ovaries, uterine serosa, pelvic mass and omentum. 11. Margins: See comment. 12. Lymph-Vascular invasion: Present 13. Lymph nodes: # examined: 0; # positive: N/A 14. TNM: pT3c, pNX 15. FIGO Stage (based on pathologic findings, needs clinical correlation: III-C 16. Comments: There is an 18 cm pelvic mass which is a high grade adenocarcinoma and there is a 12.2 cm  segment of omentum which is extensively involved with adenocarcinoma. The tumor also involves the fimbria of the right fallopian tube and the parenchyma of the right and left ovaries as well as uterine serosa. The fimbria of the right fallopian has intraepithelial atypia consistent with a precursor lesion and therefore this is most consistent with primary fallopian tube adenocarcinoma. A primary peritoneal serous carcinoma is a less likely possibility.    12/26/2016 Pathology Results   PERITONEAL/ASCITIC FLUID(SPECIMEN 1 OF 1 COLLECTED 12/26/16): POORLY DIFFERENTIATED ADENOCARCINOMA   12/26/2016 Surgery   Procedure(s) Performed: Exploratory laparotomy with total abdominal hysterectomy, bilateral salpingo-oophorectomy, omentectomy radical tumor debulking for ovarian cancer .  Surgeon: Thereasa Solo, MD.   Operative Findings: 20cm left ovarian mass densely adherent to the posterior uterus, right tube and ovary, cervix, sigmoid colon. 10cm omental cake. 4L ascites. 32m size tumor nodules on the serosa of the terminal ileum and proximal sigmoid colon. 126mnodules on right diaphragm.    This represented an optimal cytoreduction (R1) with 15m18modules on intestine and diaphragm representing gross visible disease   01/16/2017 Procedure   Placement of single lumen port a cath via right internal jugular vein. The catheter tip lies at the cavo-atrial junction. A power injectable port a cath was placed and is ready for immediate use.   01/17/2017 Imaging   1. 3.5 x 7.2 x  4.6 cm fluid collection along the vaginal cuff, posterior the bladder and extending into the right adnexal space. This lesion demonstrates rim enhancement. Imaging features could be related to a loculated postoperative seroma or hematoma. Superinfection cannot be excluded by CT. Given debulking surgery was 3 weeks ago, this entire structure is un likely to represent neoplasm, but peritoneal involvement could have this appearance. 2. Small fluid collection in the left para colic gutter without rim enhancement. 3. Irregular/nodular appearance of the peritoneal M in the anatomic pelvis with areas of subtle nodularity between the stomach and the spleen. Close attention in these regions on follow-up recommended as metastatic disease is a concern. 4. Mildly enlarged hepatoduodenal ligament lymph node. Attention on follow-up recommended.   01/20/2017 Tumor Marker   Patient's tumor was tested for the following markers: CA-125 Results of the tumor marker test revealed 46.6   01/28/2017 Genetic Testing       Negative genetic testing on the [facility].  The [facility] panel offered by [employer] sequencing and deletion/duplication testing of the following 28 genes: APC, ATM, BARD1, BMPR1A, BRCA1, BRCA2, BRIP1, CHD1, CDK4, CDKN2A, CHEK2, EPCAM (large rearrangement only), MLH1, MSH2, MSH6, MUTYH, NBN, PALB2, PMS2, PTEN, RAD51C, RAD51D, SMAD4, STK11, and TP53. Sequencing was performed for select regions of POLE and POLD1, and large rearrangement analysis was performed for select regions of GREM1. The report date is January 28, 2017.  HRD testing looking for genomic instability and BRCA mutations was negative.  The report date of this test is January 27, 2017.    01/31/2017 Tumor Marker   Patient's tumor was tested for the following markers: CA-125 Results of the tumor marker  test revealed 22.4   03/04/2017 Tumor Marker   Patient's tumor was tested for the following markers:  CA-125 Results of the tumor marker test revealed 13.8   04/18/2017 Tumor Marker   Patient's tumor was tested for the following markers: CA-125 Results of the tumor marker test revealed 11.9   05/05/2017 Tumor Marker   Patient's tumor was tested for the following markers: CA-125 Results of the tumor marker test revealed 12   05/26/2017 Tumor Marker   Patient's tumor was tested for the following markers: CA-125 Results of the tumor marker test revealed 10.6   05/26/2017 Imaging   1. A previously noted postoperative fluid collection in the low pelvis and residual ascites seen on the prior examination has resolved on today's study. No findings to suggest residual/recurrent disease on today's examination. No definite solid organ metastasis identified in the abdomen or pelvis. No lymphadenopathy. 2. Aortic atherosclerosis.   06/10/2017 Procedure   Successful right IJ vein Port-A-Cath explant.   03/09/2018 Tumor Marker   Patient's tumor was tested for the following markers: CA-125 Results of the tumor marker test revealed 12.1   05/26/2018 Tumor Marker   Patient's tumor was tested for the following markers: CA-125 Results of the tumor marker test revealed 13.8   09/09/2018 Tumor Marker   Patient's tumor was tested for the following markers: CA-125 Results of the tumor marker test revealed 23.9   12/18/2018 Tumor Marker   Patient's tumor was tested for the following markers: CA-125 Results of the tumor marker test revealed 43.8   02/27/2019 - 02/28/2019 Hospital Admission   She was admitted for bowel obstruction   02/27/2019 Imaging   1. High-grade small bowel obstruction with transition point in the pelvis in an area of suspected desmoplastic reaction surrounding a 1.3 cm spiculated nodule in the mesentery, suspicious for carcinoid. There is hyperenhancement near the tip of the appendix and loss of the normal fat plane between it and the adjacent sigmoid colon, potentially the site of primary  lesion. 2. Multiple new small subcentimeter hyperdense lesions scattered along the liver capsule, concerning for metastases. Enlarged hyperenhancing gastrohepatic and portacaval lymph nodes concerning for nodal metastases. 3. Very mild right hydroureteronephrosis to the level of the desmoplastic reaction in the pelvis, potentially involving the right ureter. 4. Infraumbilical ventral abdominal wall diastasis containing a small nondilated portion of transverse colon. 5. Trace ascites. 6. Cholelithiasis. 7.  Aortic atherosclerosis (ICD10-I70.0).     03/05/2019 Tumor Marker   Patient's tumor was tested for the following markers: CA-125 Results of the tumor marker test revealed 70.1   03/12/2019 Echocardiogram   IMPRESSIONS     1. Left ventricular ejection fraction, by visual estimation, is 60 to 65%. The left ventricle has normal function. There is no left ventricular hypertrophy.  2. Left ventricular diastolic parameters are consistent with Grade I diastolic dysfunction (impaired relaxation).  3. Global right ventricle has normal systolic function.The right ventricular size is normal. No increase in right ventricular wall thickness.  4. Left atrial size was normal.  5. Right atrial size was normal.  6. The pericardium was not assessed.  7. The mitral valve is normal in structure. No evidence of mitral valve regurgitation.  8. The tricuspid valve is normal in structure. Tricuspid valve regurgitation is trivial.  9. The aortic valve is normal in structure. Aortic valve regurgitation is not visualized. 10. The pulmonic valve was grossly normal. Pulmonic valve regurgitation is not visualized. 11. The average left ventricular global longitudinal strain is -17.6 %.  03/16/2019 Procedure   Successful placement of a right internal jugular approach power injectable Port-A-Cath. The catheter is ready for immediate use.     04/19/2019 Tumor Marker   Patient's tumor was tested for the following  markers: CA-125 Results of the tumor marker test revealed 39.8   05/17/2019 Tumor Marker   Patient's tumor was tested for the following markers: CA-125 Results of the tumor marker test revealed 27   05/28/2019 Tumor Marker   Patient's tumor was tested for the following markers: CA-125 Results of the tumor marker test revealed 27.7.   06/11/2019 Imaging   1. No new or progressive metastatic disease in the abdomen or pelvis. 2. Tiny noncalcified perisplenic implant is decreased. Calcified pericardiophrenic, retroperitoneal and bilateral inguinal lymph nodes and scattered small calcified perihepatic and perisplenic implants are stable. 3.  Aortic Atherosclerosis (ICD10-I70.0).       06/17/2019 Echocardiogram    1. Left ventricular ejection fraction, by estimation, is 65 to 70%. The left ventricle has normal function. The left ventricle has no regional wall motion abnormalities. Left ventricular diastolic parameters were normal.  2. Right ventricular systolic function is normal. The right ventricular size is normal.  3. The mitral valve is normal in structure and function. No evidence of mitral valve regurgitation. No evidence of mitral stenosis.  4. The aortic valve is normal in structure and function. Aortic valve regurgitation is not visualized. No aortic stenosis is present.   06/21/2019 Tumor Marker   Patient's tumor was tested for the following markers: CA-125 Results of the tumor marker test revealed 18.7   07/19/2019 Tumor Marker   Patient's tumor was tested for the following markers: CA-125 Results of the tumor marker test revealed 16.7   08/30/2019 Tumor Marker   Patient's tumor was tested for the following markers: CA-125. Results of the tumor marker test revealed 13.9   09/15/2019 Echocardiogram    1. Left ventricular ejection fraction, by estimation, is 65 to 70%. The left ventricle has normal function. The left ventricle has no regional wall motion abnormalities. Left ventricular  diastolic parameters are consistent with Grade I diastolic dysfunction (impaired relaxation). The average left ventricular global longitudinal strain is 18.1 %. The global longitudinal strain is normal.  2. Right ventricular systolic function is normal. The right ventricular size is normal.  3. The mitral valve is normal in structure. No evidence of mitral valve regurgitation. No evidence of mitral stenosis.  4. The aortic valve is normal in structure. Aortic valve regurgitation is not visualized. No aortic stenosis is present.  5. The inferior vena cava is normal in size with greater than 50% respiratory variability, suggesting right atrial pressure of 3 mmHg.     09/24/2019 Imaging   1. Stable exam. No new or progressive metastatic disease on today's study. 2. Small subcapsular hypodensity in the lateral spleen, new in the interval, but indeterminate. Attention on follow-up recommended. 3. The calcified upper abdominal and groin lymph nodes are stable as are the scattered calcified and noncalcified peritoneal implants. 4. Stable midline ventral hernia containing a short segment of colon without complicating features. 5. Aortic Atherosclerosis (ICD10-I70.0).     10/04/2019 -  Chemotherapy    Patient is on Treatment Plan: OVARIAN BEVACIZUMAB Q21D      10/04/2019 Tumor Marker   Patient's tumor was tested for the following markers: CA-125. Results of the tumor marker test revealed 12.9.   11/01/2019 Tumor Marker   Patient's tumor was tested for the following markers: CA-125 Results of the tumor  marker test revealed 15.4   12/13/2019 Tumor Marker   Patient's tumor was tested for the following markers: CA-125 Results of the tumor marker test revealed 14.8   12/31/2019 Imaging   1. Unchanged tiny peritoneal nodule adjacent to the spleen measuring 6 mm. Other previously noted peritoneal nodules are imperceptible on present examination. 2. Unchanged prominent, partially calcified portacaval lymph  node and bilateral inguinal lymph nodes. 3. Unchanged subtle, nonspecific subcapsular lesion of the spleen.  4. No evidence of new metastatic disease in the abdomen or pelvis. 5. Status post hysterectomy. 6. Unchanged low midline ventral abdominal hernia containing a single loop of nonobstructed transverse colon. 7. Cholelithiasis. 8. Aortic Atherosclerosis (ICD10-I70.0).       01/03/2020 Tumor Marker   Patient's tumor was tested for the following markers: CA-125 Results of the tumor marker test revealed 11.7.   01/24/2020 Tumor Marker   Patient's tumor was tested for the following markers: CA-125. Results of the tumor marker test revealed 15.1   03/06/2020 Tumor Marker   Patient's tumor was tested for the following markers: CA-125. Results of the tumor marker test revealed 20.3   03/27/2020 Tumor Marker   Patient's tumor was tested for the following markers: CA-125 Results of the tumor marker test revealed 23.7   05/11/2020 Tumor Marker   Patient's tumor was tested for the following markers: CA-125. Results of the tumor marker test revealed 25.4   06/23/2020 Imaging   1. Cholelithiasis with gallbladder wall thickening and mild infiltrative edema in the porta hepatis, raising the possibility of acute cholecystitis. There is truncation of the common bile duct distally and distal choledocholithiasis is difficult to exclude. Correlate with bilirubin levels. If clinically warranted, MRCP could be utilized for further characterization. 2. Stable tiny perisplenic nodule. 3. Other imaging findings of potential clinical significance: Small type 1 hiatal hernia. Prominent stool throughout the colon favors constipation. Ventral infraumbilical hernia contains transverse colon without findings of strangulation or obstruction. Small supraumbilical hernia contains adipose tissue. Lumbar degenerative disc disease at L5-S1. Stable tiny nodule along the lateral margin of the spleen, nonspecific. 4. Aortic  atherosclerosis.       REVIEW OF SYSTEMS:   Constitutional: Denies fevers, chills or abnormal weight loss Eyes: Denies blurriness of vision Ears, nose, mouth, throat, and face: Denies mucositis or sore throat Respiratory: Denies cough, dyspnea or wheezes Cardiovascular: Denies palpitation, chest discomfort or lower extremity swelling Gastrointestinal:  Denies nausea, heartburn or change in bowel habits Skin: Denies abnormal skin rashes Lymphatics: Denies new lymphadenopathy or easy bruising Neurological:Denies numbness, tingling or new weaknesses Behavioral/Psych: Mood is stable, no new changes  All other systems were reviewed with the patient and are negative.  I have reviewed the past medical history, past surgical history, social history and family history with the patient and they are unchanged from previous note.  ALLERGIES:  is allergic to dilaudid [hydromorphone hcl], morphine and related, and sudafed [pseudoephedrine hcl].  MEDICATIONS:  Current Outpatient Medications  Medication Sig Dispense Refill  . atenolol (TENORMIN) 25 MG tablet Take 1 tablet (25 mg total) by mouth daily. 30 tablet 3  . acetaminophen (TYLENOL) 325 MG tablet Take 975 mg by mouth every 6 (six) hours as needed (pain).    Marland Kitchen amLODipine (NORVASC) 10 MG tablet TAKE 1 TABLET(10 MG) BY MOUTH DAILY 30 tablet 9  . lidocaine-prilocaine (EMLA) cream APP EXT AA 1 TIME    . ondansetron (ZOFRAN) 8 MG tablet Take 1 tablet (8 mg total) by mouth every 8 (eight) hours as  needed. 30 tablet 1  . prochlorperazine (COMPAZINE) 10 MG tablet TAKE 1 TABLET BY MOUTH EVERY 6 HOURS AS NEEDED FOR NAUSEA AND/OR VOMITING 30 tablet 1  . venlafaxine XR (EFFEXOR-XR) 37.5 MG 24 hr capsule TAKE 1 CAPSULE BY MOUTH DAILY WITH BREAKFAST 90 capsule 3   No current facility-administered medications for this visit.   Facility-Administered Medications Ordered in Other Visits  Medication Dose Route Frequency Provider Last Rate Last Admin  .  sodium chloride flush (NS) 0.9 % injection 10 mL  10 mL Intravenous PRN Alvy Bimler, Zyrell Carmean, MD   10 mL at 02/11/17 0839  . sodium chloride flush (NS) 0.9 % injection 10 mL  10 mL Intravenous PRN Alvy Bimler, Morgon Pamer, MD   10 mL at 03/04/17 1510  . sodium chloride flush (NS) 0.9 % injection 10 mL  10 mL Intracatheter PRN Alvy Bimler, Emelda Kohlbeck, MD   10 mL at 08/07/20 1057    PHYSICAL EXAMINATION: ECOG PERFORMANCE STATUS: 0 - Asymptomatic  Vitals:   08/07/20 0858  BP: 135/69  Pulse: 75  Resp: 18  SpO2: 100%   Filed Weights   08/07/20 0858  Weight: 171 lb 12.8 oz (77.9 kg)    GENERAL:alert, no distress and comfortable SKIN: skin color, texture, turgor are normal, no rashes or significant lesions EYES: normal, Conjunctiva are pink and non-injected, sclera clear OROPHARYNX:no exudate, no erythema and lips, buccal mucosa, and tongue normal  NECK: supple, thyroid normal size, non-tender, without nodularity LYMPH:  no palpable lymphadenopathy in the cervical, axillary or inguinal LUNGS: clear to auscultation and percussion with normal breathing effort HEART: regular rate & rhythm and no murmurs and no lower extremity edema ABDOMEN:abdomen soft, non-tender and normal bowel sounds Musculoskeletal:no cyanosis of digits and no clubbing  NEURO: alert & oriented x 3 with fluent speech, no focal motor/sensory deficits  LABORATORY DATA:  I have reviewed the data as listed    Component Value Date/Time   NA 140 08/07/2020 0825   NA 139 04/14/2017 0742   K 4.1 08/07/2020 0825   K 4.0 04/14/2017 0742   CL 105 08/07/2020 0825   CO2 24 08/07/2020 0825   CO2 21 (L) 04/14/2017 0742   GLUCOSE 123 (H) 08/07/2020 0825   GLUCOSE 247 (H) 04/14/2017 0742   BUN 20 08/07/2020 0825   BUN 13.1 04/14/2017 0742   CREATININE 1.12 (H) 08/07/2020 0825   CREATININE 0.97 04/17/2020 0812   CREATININE 0.9 04/14/2017 0742   CALCIUM 8.9 08/07/2020 0825   CALCIUM 9.2 04/14/2017 0742   PROT 6.5 08/07/2020 0825   PROT 7.3 04/14/2017 0742    ALBUMIN 3.6 08/07/2020 0825   ALBUMIN 3.9 04/14/2017 0742   AST 16 08/07/2020 0825   AST 18 04/17/2020 0812   AST 17 04/14/2017 0742   ALT 9 08/07/2020 0825   ALT 11 04/17/2020 0812   ALT 14 04/14/2017 0742   ALKPHOS 60 08/07/2020 0825   ALKPHOS 70 04/14/2017 0742   BILITOT 0.5 08/07/2020 0825   BILITOT 0.3 04/17/2020 0812   BILITOT 0.37 04/14/2017 0742   GFRNONAA 55 (L) 08/07/2020 0825   GFRNONAA >60 04/17/2020 0812   GFRAA >60 01/24/2020 0924   GFRAA >60 07/19/2019 0951    No results found for: SPEP, UPEP  Lab Results  Component Value Date   WBC 6.7 08/07/2020   NEUTROABS 4.0 08/07/2020   HGB 12.8 08/07/2020   HCT 39.5 08/07/2020   MCV 95.4 08/07/2020   PLT 169 08/07/2020      Chemistry  Component Value Date/Time   NA 140 08/07/2020 0825   NA 139 04/14/2017 0742   K 4.1 08/07/2020 0825   K 4.0 04/14/2017 0742   CL 105 08/07/2020 0825   CO2 24 08/07/2020 0825   CO2 21 (L) 04/14/2017 0742   BUN 20 08/07/2020 0825   BUN 13.1 04/14/2017 0742   CREATININE 1.12 (H) 08/07/2020 0825   CREATININE 0.97 04/17/2020 0812   CREATININE 0.9 04/14/2017 0742      Component Value Date/Time   CALCIUM 8.9 08/07/2020 0825   CALCIUM 9.2 04/14/2017 0742   ALKPHOS 60 08/07/2020 0825   ALKPHOS 70 04/14/2017 0742   AST 16 08/07/2020 0825   AST 18 04/17/2020 0812   AST 17 04/14/2017 0742   ALT 9 08/07/2020 0825   ALT 11 04/17/2020 0812   ALT 14 04/14/2017 0742   BILITOT 0.5 08/07/2020 0825   BILITOT 0.3 04/17/2020 0812   BILITOT 0.37 04/14/2017 5992

## 2020-08-07 NOTE — Assessment & Plan Note (Signed)
Her blood pressure is suboptimally controlled We will add additional blood pressure medication The patient is instructed to monitor carefully and to make lifestyle changes

## 2020-08-07 NOTE — Assessment & Plan Note (Signed)
Her last CT imaging looks good Her tumor marker is not helpful so I will stop checking tumor markers I recommend we proceed with bevacizumab as scheduled every 3 weeks I do not plan to repeat imaging study again until 4 months, next due in July She is noted to have slight worsening hypertension We will treat that aggressively

## 2020-08-11 ENCOUNTER — Telehealth: Payer: Self-pay

## 2020-08-11 NOTE — Telephone Encounter (Signed)
She called and left a message to call her.  Called back and left a message asking her to call the office back. 

## 2020-08-11 NOTE — Telephone Encounter (Signed)
Called back. BP yesterday am 131/76 and pulse 58, pm BP 121/71, pulse 55. Today BP, 123/79 and pulse 53. She was told to call if HR consistently 60 or <. Instructed to continue to check bp BID and to call back Monday am with readings. She is taking Atenolol at bedtime. Instructed to hold Atenolol if heart rate is 60 or less. She verbalized understanding.

## 2020-08-14 NOTE — Telephone Encounter (Signed)
Called back to review bp readings over the weekend. Friday pm bp 115/72 and HR 62, she did not take the Atenolol. Saturday am bp- 142/86 and HR 62, she took Atenolol, she started taking in am . Saturday pm bp-124/72 and HR 60 Sunday am bp- 130/81 and HR 57, she took Atenolol. Sunday pm bp-125/64 and HR 66 Today am bp-131/81 and HR 58, she took Atenolol. She is now laying down and feeling clammy and having a little nausea. Instructed to drink and eat crackers. She verbalized udnerstanding.

## 2020-08-14 NOTE — Telephone Encounter (Signed)
With her BP borderline low, tell her not to restart atenolol Just monitor for now and watch her diet

## 2020-08-14 NOTE — Telephone Encounter (Signed)
Called and given below message. She verbalized understanding. 

## 2020-08-28 ENCOUNTER — Inpatient Hospital Stay: Payer: Self-pay

## 2020-08-28 ENCOUNTER — Inpatient Hospital Stay: Payer: Self-pay | Attending: Hematology and Oncology

## 2020-08-28 ENCOUNTER — Inpatient Hospital Stay (HOSPITAL_BASED_OUTPATIENT_CLINIC_OR_DEPARTMENT_OTHER): Payer: Self-pay | Admitting: Hematology and Oncology

## 2020-08-28 ENCOUNTER — Other Ambulatory Visit: Payer: Self-pay

## 2020-08-28 ENCOUNTER — Encounter: Payer: Self-pay | Admitting: Hematology and Oncology

## 2020-08-28 DIAGNOSIS — Z7189 Other specified counseling: Secondary | ICD-10-CM

## 2020-08-28 DIAGNOSIS — Z79899 Other long term (current) drug therapy: Secondary | ICD-10-CM | POA: Insufficient documentation

## 2020-08-28 DIAGNOSIS — C562 Malignant neoplasm of left ovary: Secondary | ICD-10-CM

## 2020-08-28 DIAGNOSIS — C569 Malignant neoplasm of unspecified ovary: Secondary | ICD-10-CM

## 2020-08-28 DIAGNOSIS — C5701 Malignant neoplasm of right fallopian tube: Secondary | ICD-10-CM

## 2020-08-28 DIAGNOSIS — I10 Essential (primary) hypertension: Secondary | ICD-10-CM | POA: Insufficient documentation

## 2020-08-28 LAB — COMPREHENSIVE METABOLIC PANEL
ALT: 8 U/L (ref 0–44)
AST: 16 U/L (ref 15–41)
Albumin: 3.7 g/dL (ref 3.5–5.0)
Alkaline Phosphatase: 56 U/L (ref 38–126)
Anion gap: 13 (ref 5–15)
BUN: 14 mg/dL (ref 8–23)
CO2: 23 mmol/L (ref 22–32)
Calcium: 8.9 mg/dL (ref 8.9–10.3)
Chloride: 104 mmol/L (ref 98–111)
Creatinine, Ser: 1.13 mg/dL — ABNORMAL HIGH (ref 0.44–1.00)
GFR, Estimated: 54 mL/min — ABNORMAL LOW (ref >60)
Glucose, Bld: 136 mg/dL — ABNORMAL HIGH (ref 70–99)
Potassium: 4 mmol/L (ref 3.5–5.1)
Sodium: 140 mmol/L (ref 135–145)
Total Bilirubin: 0.6 mg/dL (ref 0.3–1.2)
Total Protein: 6.6 g/dL (ref 6.5–8.1)

## 2020-08-28 LAB — CBC WITH DIFFERENTIAL/PLATELET
Abs Immature Granulocytes: 0.01 10*3/uL (ref 0.00–0.07)
Basophils Absolute: 0 10*3/uL (ref 0.0–0.1)
Basophils Relative: 1 %
Eosinophils Absolute: 0.3 10*3/uL (ref 0.0–0.5)
Eosinophils Relative: 5 %
HCT: 39.2 % (ref 36.0–46.0)
Hemoglobin: 12.7 g/dL (ref 12.0–15.0)
Immature Granulocytes: 0 %
Lymphocytes Relative: 26 %
Lymphs Abs: 1.4 10*3/uL (ref 0.7–4.0)
MCH: 30.8 pg (ref 26.0–34.0)
MCHC: 32.4 g/dL (ref 30.0–36.0)
MCV: 94.9 fL (ref 80.0–100.0)
Monocytes Absolute: 0.6 10*3/uL (ref 0.1–1.0)
Monocytes Relative: 11 %
Neutro Abs: 3.1 10*3/uL (ref 1.7–7.7)
Neutrophils Relative %: 57 %
Platelets: 165 10*3/uL (ref 150–400)
RBC: 4.13 MIL/uL (ref 3.87–5.11)
RDW: 13.6 % (ref 11.5–15.5)
WBC: 5.3 10*3/uL (ref 4.0–10.5)
nRBC: 0 % (ref 0.0–0.2)

## 2020-08-28 LAB — TOTAL PROTEIN, URINE DIPSTICK: Protein, ur: 300 mg/dL — AB

## 2020-08-28 MED ORDER — LISINOPRIL 20 MG PO TABS: 20.0000 mg | ORAL_TABLET | Freq: Every day | ORAL | 3 refills | Status: DC

## 2020-08-28 MED ORDER — SODIUM CHLORIDE 0.9% FLUSH
10.0000 mL | Freq: Once | INTRAVENOUS | Status: AC
Start: 2020-08-28 — End: 2020-08-28
  Administered 2020-08-28: 10 mL
  Filled 2020-08-28: qty 10

## 2020-08-28 NOTE — Assessment & Plan Note (Signed)
She has severe, uncontrolled hypertension with heavy proteinuria Thankfully, she is not symptomatic She will continue to check her blood pressure twice daily I plan to add lisinopril I warned her about risk of side effects of lisinopril I will set up virtual visit next week for further follow-up

## 2020-08-28 NOTE — Assessment & Plan Note (Signed)
Unfortunately, she has developed significant hypertension secondary to side effects of bevacizumab With heavy proteinuria, her treatment is placed on hold today I plan to aggressively manage her blood pressure before her next treatment I plan to also reduce the dose of bevacizumab to 10 mg/kg dose next month

## 2020-08-28 NOTE — Progress Notes (Signed)
[facility] OFFICE PROGRESS NOTE  Patient Care Team: Gaynelle Arabian, MD as PCP - General (Family Medicine) Heath Lark, MD as Consulting Physician (Hematology and Oncology) Everitt Amber, MD as Consulting Physician (Obstetrics and Gynecology)  ASSESSMENT & PLAN:  Adenocarcinoma of right fallopian tube [name]) Unfortunately, she has developed significant hypertension secondary to side effects of bevacizumab With heavy proteinuria, her treatment is placed on hold today I plan to aggressively manage her blood pressure before her next treatment I plan to also reduce the dose of bevacizumab to 10 mg/kg dose next month  Essential hypertension She has severe, uncontrolled hypertension with heavy proteinuria Thankfully, she is not symptomatic She will continue to check her blood pressure twice daily I plan to add lisinopril I warned her about risk of side effects of lisinopril I will set up virtual visit next week for further follow-up   No orders of the defined types were placed in this encounter.   All questions were answered. The patient knows to call the clinic with any problems, questions or concerns. The total time spent in the appointment was 20 minutes encounter with patients including review of chart and various tests results, discussions about plan of care and coordination of care plan   Heath Lark, MD 08/28/2020 9:37 AM  INTERVAL HISTORY: Please see below for problem oriented charting. She returns for further follow-up She denies side effects from treatment so far Denies headache or changes in her vision She shared with me her documented blood pressure at home A few times, her systolic blood pressure in the 150s  SUMMARY OF ONCOLOGIC HISTORY: Oncology History Overview Note  Neg genetics   Adenocarcinoma of right fallopian tube ([place])  08/12/2016 Initial Diagnosis   The patient has many months of vague symptoms of fullness in the pelvis, urinary frequency and  difficulty with defecation. She denies vaginal bleeding or rectal bleeding.    08/12/2016 Imaging   Abdomen X-ray in the ER: Moderate colonic stool burden without evidence of enteric obstruction   11/13/2016 Imaging   TVUS was performed on 11/13/16 which showed a large hypoechoic lobular mass seen posterior to the uterus measuring 18.2x16.1x11.8cm, blood flow was seen along the periphery of the mass. It was considered to be likely to be a fibroid vs pelvic mass vs ovarian mass. Neither ovary was removed. Moderate amount of free fluid in the pelvis. THe uterus measures 4x10x4cm. The endometrium is 21m.    12/05/2016 Imaging   MR pelvis 1. Mild limitations as detailed above. 2. Heterogeneous pelvic mass is favored to arise from the right ovary. Favor a solid ovarian neoplasm such as fibroma/Brenner's tumor. Given lesion size and heterogeneous T2 signal out, complicating torsion cannot be excluded. 3. Moderate abdominopelvic ascites, without specific evidence of peritoneal metastasis. Consider further evaluation with contrast-enhanced abdominopelvic CT.    12/26/2016 Pathology Results   1. Uterus, ovaries and fallopian tubes - ADENOCARCINOMA INVOLVING RIGHT FALLOPIAN TUBE, RIGHT AND LEFT OVARIES, UTERINE SEROSA AND PELVIC MASS. - SEE ONCOLOGY TABLE AND COMMENT. - UTERINE CERVIX, ENDOMETRIUM, MYOMETRIUM AND LEFT FALLOPIAN TUBE FREE OF TUMOR. 2. Omentum, resection for tumor - ADENOCARCINOMA. Microscopic Comment 1. ONCOLOGY TABLE - FALLOPIAN TUBE. SEE COMMENT 1. Specimen, including laterality: Uterus, bilateral adnexa, pelvic mass and omentum. 2. Procedure: Hysterectomy with bilateral salpingo-oophorectomy, pelvic mass excision and omentectomy. 3. Lymph node sampling performed: No 4. Tumor site: See comment. 5. Tumor location in fallopian tube: Right fallopian tube fimbria 6. Specimen integrity (intact/ruptured/disrupted): Intact 7. Tumor size (cm): 18 cm,  see comment. 8. Histologic type:  Adenocarcinoma 9. Grade: High grade 10. Microscopic tumor extension: Tumor involves right fallopian tube, right and left ovaries, uterine serosa, pelvic mass and omentum. 11. Margins: See comment. 12. Lymph-Vascular invasion: Present 13. Lymph nodes: # examined: 0; # positive: N/A 14. TNM: pT3c, pNX 15. FIGO Stage (based on pathologic findings, needs clinical correlation: III-C 16. Comments: There is an 18 cm pelvic mass which is a high grade adenocarcinoma and there is a 12.2 cm  segment of omentum which is extensively involved with adenocarcinoma. The tumor also involves the fimbria of the right fallopian tube and the parenchyma of the right and left ovaries as well as uterine serosa. The fimbria of the right fallopian has intraepithelial atypia consistent with a precursor lesion and therefore this is most consistent with primary fallopian tube adenocarcinoma. A primary peritoneal serous carcinoma is a less likely possibility.    12/26/2016 Pathology Results   PERITONEAL/ASCITIC FLUID(SPECIMEN 1 OF 1 COLLECTED 12/26/16): POORLY DIFFERENTIATED ADENOCARCINOMA   12/26/2016 Surgery   Procedure(s) Performed: Exploratory laparotomy with total abdominal hysterectomy, bilateral salpingo-oophorectomy, omentectomy radical tumor debulking for ovarian cancer .  Surgeon: Thereasa Solo, MD.   Operative Findings: 20cm left ovarian mass densely adherent to the posterior uterus, right tube and ovary, cervix, sigmoid colon. 10cm omental cake. 4L ascites. 2m size tumor nodules on the serosa of the terminal ileum and proximal sigmoid colon. 186mnodules on right diaphragm.    This represented an optimal cytoreduction (R1) with 54m3modules on intestine and diaphragm representing gross visible disease   01/16/2017 Procedure   Placement of single lumen port a cath via right internal jugular vein. The catheter tip lies at the cavo-atrial junction. A power injectable port a cath was placed and is ready for immediate  use.   01/17/2017 Imaging   1. 3.5 x 7.2 x 4.6 cm fluid collection along the vaginal cuff, posterior the bladder and extending into the right adnexal space. This lesion demonstrates rim enhancement. Imaging features could be related to a loculated postoperative seroma or hematoma. Superinfection cannot be excluded by CT. Given debulking surgery was 3 weeks ago, this entire structure is un likely to represent neoplasm, but peritoneal involvement could have this appearance. 2. Small fluid collection in the left para colic gutter without rim enhancement. 3. Irregular/nodular appearance of the peritoneal M in the anatomic pelvis with areas of subtle nodularity between the stomach and the spleen. Close attention in these regions on follow-up recommended as metastatic disease is a concern. 4. Mildly enlarged hepatoduodenal ligament lymph node. Attention on follow-up recommended.   01/20/2017 Tumor Marker   Patient's tumor was tested for the following markers: CA-125 Results of the tumor marker test revealed 46.6   01/28/2017 Genetic Testing       Negative genetic testing on the [name].  The [facility] panel offered by [employer] sequencing and deletion/duplication testing of the following 28 genes: APC, ATM, BARD1, BMPR1A, BRCA1, BRCA2, BRIP1, CHD1, CDK4, CDKN2A, CHEK2, EPCAM (large rearrangement only), MLH1, MSH2, MSH6, MUTYH, NBN, PALB2, PMS2, PTEN, RAD51C, RAD51D, SMAD4, STK11, and TP53. Sequencing was performed for select regions of POLE and POLD1, and large rearrangement analysis was performed for select regions of GREM1. The report date is January 28, 2017.  HRD testing looking for genomic instability and BRCA mutations was negative.  The report date of this test is January 27, 2017.    01/31/2017 Tumor Marker   Patient's tumor was tested for the  following markers: CA-125 Results of the tumor marker test revealed 22.4   03/04/2017 Tumor Marker   Patient's  tumor was tested for the following markers: CA-125 Results of the tumor marker test revealed 13.8   04/18/2017 Tumor Marker   Patient's tumor was tested for the following markers: CA-125 Results of the tumor marker test revealed 11.9   05/05/2017 Tumor Marker   Patient's tumor was tested for the following markers: CA-125 Results of the tumor marker test revealed 12   05/26/2017 Tumor Marker   Patient's tumor was tested for the following markers: CA-125 Results of the tumor marker test revealed 10.6   05/26/2017 Imaging   1. A previously noted postoperative fluid collection in the low pelvis and residual ascites seen on the prior examination has resolved on today's study. No findings to suggest residual/recurrent disease on today's examination. No definite solid organ metastasis identified in the abdomen or pelvis. No lymphadenopathy. 2. Aortic atherosclerosis.   06/10/2017 Procedure   Successful right IJ vein Port-A-Cath explant.   03/09/2018 Tumor Marker   Patient's tumor was tested for the following markers: CA-125 Results of the tumor marker test revealed 12.1   05/26/2018 Tumor Marker   Patient's tumor was tested for the following markers: CA-125 Results of the tumor marker test revealed 13.8   09/09/2018 Tumor Marker   Patient's tumor was tested for the following markers: CA-125 Results of the tumor marker test revealed 23.9   12/18/2018 Tumor Marker   Patient's tumor was tested for the following markers: CA-125 Results of the tumor marker test revealed 43.8   02/27/2019 - 02/28/2019 [name] Admission   She was admitted for bowel obstruction   02/27/2019 Imaging   1. High-grade small bowel obstruction with transition point in the pelvis in an area of suspected desmoplastic reaction surrounding a 1.3 cm spiculated nodule in the mesentery, suspicious for carcinoid. There is hyperenhancement near the tip of the appendix and loss of the normal fat plane between it and the adjacent  sigmoid colon, potentially the site of primary lesion. 2. Multiple new small subcentimeter hyperdense lesions scattered along the liver capsule, concerning for metastases. Enlarged hyperenhancing gastrohepatic and portacaval lymph nodes concerning for nodal metastases. 3. Very mild right hydroureteronephrosis to the level of the desmoplastic reaction in the pelvis, potentially involving the right ureter. 4. Infraumbilical ventral abdominal wall diastasis containing a small nondilated portion of transverse colon. 5. Trace ascites. 6. Cholelithiasis. 7.  Aortic atherosclerosis (ICD10-I70.0).     03/05/2019 Tumor Marker   Patient's tumor was tested for the following markers: CA-125 Results of the tumor marker test revealed 70.1   03/12/2019 Echocardiogram   IMPRESSIONS     1. Left ventricular ejection fraction, by visual estimation, is 60 to 65%. The left ventricle has normal function. There is no left ventricular hypertrophy.  2. Left ventricular diastolic parameters are consistent with Grade I diastolic dysfunction (impaired relaxation).  3. Global right ventricle has normal systolic function.The right ventricular size is normal. No increase in right ventricular wall thickness.  4. Left atrial size was normal.  5. Right atrial size was normal.  6. The pericardium was not assessed.  7. The mitral valve is normal in structure. No evidence of mitral valve regurgitation.  8. The tricuspid valve is normal in structure. Tricuspid valve regurgitation is trivial.  9. The aortic valve is normal in structure. Aortic valve regurgitation is not visualized. 10. The pulmonic valve was grossly normal. Pulmonic valve regurgitation is not visualized. 11. The average  left ventricular global longitudinal strain is -17.6 %.   03/16/2019 Procedure   Successful placement of a right internal jugular approach power injectable Port-A-Cath. The catheter is ready for immediate use.     04/19/2019 Tumor Marker    Patient's tumor was tested for the following markers: CA-125 Results of the tumor marker test revealed 39.8   05/17/2019 Tumor Marker   Patient's tumor was tested for the following markers: CA-125 Results of the tumor marker test revealed 27   05/28/2019 Tumor Marker   Patient's tumor was tested for the following markers: CA-125 Results of the tumor marker test revealed 27.7.   06/11/2019 Imaging   1. No new or progressive metastatic disease in the abdomen or pelvis. 2. Tiny noncalcified perisplenic implant is decreased. Calcified pericardiophrenic, retroperitoneal and bilateral inguinal lymph nodes and scattered small calcified perihepatic and perisplenic implants are stable. 3.  Aortic Atherosclerosis (ICD10-I70.0).       06/17/2019 Echocardiogram    1. Left ventricular ejection fraction, by estimation, is 65 to 70%. The left ventricle has normal function. The left ventricle has no regional wall motion abnormalities. Left ventricular diastolic parameters were normal.  2. Right ventricular systolic function is normal. The right ventricular size is normal.  3. The mitral valve is normal in structure and function. No evidence of mitral valve regurgitation. No evidence of mitral stenosis.  4. The aortic valve is normal in structure and function. Aortic valve regurgitation is not visualized. No aortic stenosis is present.   06/21/2019 Tumor Marker   Patient's tumor was tested for the following markers: CA-125 Results of the tumor marker test revealed 18.7   07/19/2019 Tumor Marker   Patient's tumor was tested for the following markers: CA-125 Results of the tumor marker test revealed 16.7   08/30/2019 Tumor Marker   Patient's tumor was tested for the following markers: CA-125. Results of the tumor marker test revealed 13.9   09/15/2019 Echocardiogram    1. Left ventricular ejection fraction, by estimation, is 65 to 70%. The left ventricle has normal function. The left ventricle has no  regional wall motion abnormalities. Left ventricular diastolic parameters are consistent with Grade I diastolic dysfunction (impaired relaxation). The average left ventricular global longitudinal strain is 18.1 %. The global longitudinal strain is normal.  2. Right ventricular systolic function is normal. The right ventricular size is normal.  3. The mitral valve is normal in structure. No evidence of mitral valve regurgitation. No evidence of mitral stenosis.  4. The aortic valve is normal in structure. Aortic valve regurgitation is not visualized. No aortic stenosis is present.  5. The inferior vena cava is normal in size with greater than 50% respiratory variability, suggesting right atrial pressure of 3 mmHg.     09/24/2019 Imaging   1. Stable exam. No new or progressive metastatic disease on today's study. 2. Small subcapsular hypodensity in the lateral spleen, new in the interval, but indeterminate. Attention on follow-up recommended. 3. The calcified upper abdominal and groin lymph nodes are stable as are the scattered calcified and noncalcified peritoneal implants. 4. Stable midline ventral hernia containing a short segment of colon without complicating features. 5. Aortic Atherosclerosis (ICD10-I70.0).     10/04/2019 -  Chemotherapy    Patient is on Treatment Plan: OVARIAN BEVACIZUMAB Q21D      10/04/2019 Tumor Marker   Patient's tumor was tested for the following markers: CA-125. Results of the tumor marker test revealed 12.9.   11/01/2019 Tumor Marker   Patient's tumor was  tested for the following markers: CA-125 Results of the tumor marker test revealed 15.4   12/13/2019 Tumor Marker   Patient's tumor was tested for the following markers: CA-125 Results of the tumor marker test revealed 14.8   12/31/2019 Imaging   1. Unchanged tiny peritoneal nodule adjacent to the spleen measuring 6 mm. Other previously noted peritoneal nodules are imperceptible on present examination. 2.  Unchanged prominent, partially calcified portacaval lymph node and bilateral inguinal lymph nodes. 3. Unchanged subtle, nonspecific subcapsular lesion of the spleen.  4. No evidence of new metastatic disease in the abdomen or pelvis. 5. Status post hysterectomy. 6. Unchanged low midline ventral abdominal hernia containing a single loop of nonobstructed transverse colon. 7. Cholelithiasis. 8. Aortic Atherosclerosis (ICD10-I70.0).       01/03/2020 Tumor Marker   Patient's tumor was tested for the following markers: CA-125 Results of the tumor marker test revealed 11.7.   01/24/2020 Tumor Marker   Patient's tumor was tested for the following markers: CA-125. Results of the tumor marker test revealed 15.1   03/06/2020 Tumor Marker   Patient's tumor was tested for the following markers: CA-125. Results of the tumor marker test revealed 20.3   03/27/2020 Tumor Marker   Patient's tumor was tested for the following markers: CA-125 Results of the tumor marker test revealed 23.7   05/11/2020 Tumor Marker   Patient's tumor was tested for the following markers: CA-125. Results of the tumor marker test revealed 25.4   06/23/2020 Imaging   1. Cholelithiasis with gallbladder wall thickening and mild infiltrative edema in the porta hepatis, raising the possibility of acute cholecystitis. There is truncation of the common bile duct distally and distal choledocholithiasis is difficult to exclude. Correlate with bilirubin levels. If clinically warranted, MRCP could be utilized for further characterization. 2. Stable tiny perisplenic nodule. 3. Other imaging findings of potential clinical significance: Small type 1 hiatal hernia. Prominent stool throughout the colon favors constipation. Ventral infraumbilical hernia contains transverse colon without findings of strangulation or obstruction. Small supraumbilical hernia contains adipose tissue. Lumbar degenerative disc disease at L5-S1. Stable tiny nodule along  the lateral margin of the spleen, nonspecific. 4. Aortic atherosclerosis.       REVIEW OF SYSTEMS:   Constitutional: Denies fevers, chills or abnormal weight loss Eyes: Denies blurriness of vision Ears, nose, mouth, throat, and face: Denies mucositis or sore throat Respiratory: Denies cough, dyspnea or wheezes Cardiovascular: Denies palpitation, chest discomfort or lower extremity swelling Gastrointestinal:  Denies nausea, heartburn or change in bowel habits Skin: Denies abnormal skin rashes Lymphatics: Denies new lymphadenopathy or easy bruising Neurological:Denies numbness, tingling or new weaknesses Behavioral/Psych: Mood is stable, no new changes  All other systems were reviewed with the patient and are negative.  I have reviewed the past medical history, past surgical history, social history and family history with the patient and they are unchanged from previous note.  ALLERGIES:  is allergic to dilaudid [hydromorphone hcl], morphine and related, and sudafed [pseudoephedrine hcl].  MEDICATIONS:  Current Outpatient Medications  Medication Sig Dispense Refill  . lisinopril (ZESTRIL) 20 MG tablet Take 1 tablet (20 mg total) by mouth daily. 30 tablet 3  . acetaminophen (TYLENOL) 325 MG tablet Take 975 mg by mouth every 6 (six) hours as needed (pain).    Marland Kitchen amLODipine (NORVASC) 10 MG tablet TAKE 1 TABLET(10 MG) BY MOUTH DAILY 30 tablet 9  . lidocaine-prilocaine (EMLA) cream APP EXT AA 1 TIME    . ondansetron (ZOFRAN) 8 MG tablet Take 1 tablet (  8 mg total) by mouth every 8 (eight) hours as needed. 30 tablet 1  . prochlorperazine (COMPAZINE) 10 MG tablet TAKE 1 TABLET BY MOUTH EVERY 6 HOURS AS NEEDED FOR NAUSEA AND/OR VOMITING 30 tablet 1  . venlafaxine XR (EFFEXOR-XR) 37.5 MG 24 hr capsule TAKE 1 CAPSULE BY MOUTH DAILY WITH BREAKFAST 90 capsule 3   No current facility-administered medications for this visit.   Facility-Administered Medications Ordered in Other Visits  Medication  Dose Route Frequency Provider Last Rate Last Admin  . sodium chloride flush (NS) 0.9 % injection 10 mL  10 mL Intravenous PRN Alvy Bimler, Olubunmi Rothenberger, MD   10 mL at 02/11/17 0839  . sodium chloride flush (NS) 0.9 % injection 10 mL  10 mL Intravenous PRN Heath Lark, MD   10 mL at 03/04/17 1510    PHYSICAL EXAMINATION: ECOG PERFORMANCE STATUS: 1 - Symptomatic but completely ambulatory  Vitals:   08/28/20 0854  BP: (!) 191/79  Pulse: 98  Resp: 18  SpO2: 100%   Filed Weights   08/28/20 0854  Weight: 168 lb (76.2 kg)    GENERAL:alert, no distress and comfortable NEURO: alert & oriented x 3 with fluent speech, no focal motor/sensory deficits  LABORATORY DATA:  I have reviewed the data as listed    Component Value Date/Time   NA 140 08/28/2020 0816   NA 139 04/14/2017 0742   K 4.0 08/28/2020 0816   K 4.0 04/14/2017 0742   CL 104 08/28/2020 0816   CO2 23 08/28/2020 0816   CO2 21 (L) 04/14/2017 0742   GLUCOSE 136 (H) 08/28/2020 0816   GLUCOSE 247 (H) 04/14/2017 0742   BUN 14 08/28/2020 0816   BUN 13.1 04/14/2017 0742   CREATININE 1.13 (H) 08/28/2020 0816   CREATININE 0.97 04/17/2020 0812   CREATININE 0.9 04/14/2017 0742   CALCIUM 8.9 08/28/2020 0816   CALCIUM 9.2 04/14/2017 0742   PROT 6.6 08/28/2020 0816   PROT 7.3 04/14/2017 0742   ALBUMIN 3.7 08/28/2020 0816   ALBUMIN 3.9 04/14/2017 0742   AST 16 08/28/2020 0816   AST 18 04/17/2020 0812   AST 17 04/14/2017 0742   ALT 8 08/28/2020 0816   ALT 11 04/17/2020 0812   ALT 14 04/14/2017 0742   ALKPHOS 56 08/28/2020 0816   ALKPHOS 70 04/14/2017 0742   BILITOT 0.6 08/28/2020 0816   BILITOT 0.3 04/17/2020 0812   BILITOT 0.37 04/14/2017 0742   GFRNONAA 54 (L) 08/28/2020 0816   GFRNONAA >60 04/17/2020 0812   GFRAA >60 01/24/2020 0924   GFRAA >60 07/19/2019 0951    No results found for: SPEP, UPEP  Lab Results  Component Value Date   WBC 5.3 08/28/2020   NEUTROABS 3.1 08/28/2020   HGB 12.7 08/28/2020   HCT 39.2 08/28/2020    MCV 94.9 08/28/2020   PLT 165 08/28/2020      Chemistry      Component Value Date/Time   NA 140 08/28/2020 0816   NA 139 04/14/2017 0742   K 4.0 08/28/2020 0816   K 4.0 04/14/2017 0742   CL 104 08/28/2020 0816   CO2 23 08/28/2020 0816   CO2 21 (L) 04/14/2017 0742   BUN 14 08/28/2020 0816   BUN 13.1 04/14/2017 0742   CREATININE 1.13 (H) 08/28/2020 0816   CREATININE 0.97 04/17/2020 0812   CREATININE 0.9 04/14/2017 0742      Component Value Date/Time   CALCIUM 8.9 08/28/2020 0816   CALCIUM 9.2 04/14/2017 0742   ALKPHOS 56 08/28/2020 0816  ALKPHOS 70 04/14/2017 0742   AST 16 08/28/2020 0816   AST 18 04/17/2020 0812   AST 17 04/14/2017 0742   ALT 8 08/28/2020 0816   ALT 11 04/17/2020 0812   ALT 14 04/14/2017 0742   BILITOT 0.6 08/28/2020 0816   BILITOT 0.3 04/17/2020 0812   BILITOT 0.37 04/14/2017 6394

## 2020-09-04 ENCOUNTER — Telehealth: Payer: Self-pay

## 2020-09-04 NOTE — Telephone Encounter (Signed)
Patient called due to discomfort she experienced Friday afternoon, pain in center of abdominal area, radiating throughout right shoulder.   Patient states she was told previously she had gallbladder abnormalities.  Reports pain is worse after eating greasy foods.  RN educated patient to avoid greasy foods at this time.   Patient wants to inquire to see if you think it's time to proceed with evaluation to have gallbladder removed.

## 2020-09-05 NOTE — Telephone Encounter (Signed)
I cannot see her today or tomorrow I suggest eval by Lucianne Lei

## 2020-09-07 ENCOUNTER — Encounter: Payer: Self-pay | Admitting: Hematology and Oncology

## 2020-09-07 ENCOUNTER — Other Ambulatory Visit: Payer: Self-pay

## 2020-09-07 ENCOUNTER — Inpatient Hospital Stay (HOSPITAL_BASED_OUTPATIENT_CLINIC_OR_DEPARTMENT_OTHER): Payer: Self-pay | Admitting: Hematology and Oncology

## 2020-09-07 DIAGNOSIS — C5701 Malignant neoplasm of right fallopian tube: Secondary | ICD-10-CM

## 2020-09-07 DIAGNOSIS — I10 Essential (primary) hypertension: Secondary | ICD-10-CM

## 2020-09-07 DIAGNOSIS — K828 Other specified diseases of gallbladder: Secondary | ICD-10-CM

## 2020-09-07 NOTE — Assessment & Plan Note (Signed)
She had recent symptoms of right upper quadrant pain radiating to the shoulder, most consistent with mild cholecystitis Since the patient modify her diet, that has resolved spontaneously We discussed the importance of dietary modification due to presence of gallstones disease We discussed the risk and benefits of withholding bevacizumab to allow her to proceed with elective cholecystectomy but the patient declines

## 2020-09-07 NOTE — Progress Notes (Signed)
HEMATOLOGY-ONCOLOGY ELECTRONIC VISIT PROGRESS NOTE  Patient Care Team: Gaynelle Arabian, MD as PCP - General (Family Medicine) Heath Lark, MD as Consulting Physician (Hematology and Oncology) Everitt Amber, MD as Consulting Physician (Obstetrics and Gynecology)  I connected with the patient via telephone for virtual visit today ASSESSMENT & PLAN:  Adenocarcinoma of right fallopian tube [facility]) Her blood pressure is better controlled with the addition of lisinopril We discussed the risk and benefits of holding bevacizumab, if she is interested to proceed with cholecystectomy Ultimately, her plan would be to resume treatment as scheduled next month She is contemplating applying for permanent disability due to her incurable cancer   Thickening of wall of gallbladder She had recent symptoms of right upper quadrant pain radiating to the shoulder, most consistent with mild cholecystitis Since the patient modify her diet, that has resolved spontaneously We discussed the importance of dietary modification due to presence of gallstones disease We discussed the risk and benefits of withholding bevacizumab to allow her to proceed with elective cholecystectomy but the patient declines  Essential hypertension Her documented blood pressure over the last 3 days were satisfactory, with systolic blood pressure ranging between 938-101 and diastolic between 75-10 She denies headache or other symptoms such as blurriness of vision She will continue close monitoring of her blood pressure and she will take her current prescribed medications   No orders of the defined types were placed in this encounter.   INTERVAL HISTORY: Please see below for problem oriented charting. The purpose of today's virtual visit is to address her recent high blood pressure as well as her new symptoms of right upper quadrant pain When she contacted the office several days ago, she had right upper quadrant pain radiating to the  shoulder She denies nausea vomiting or fevers She was recommended modification of diet She stated that her symptom was brought on because she ate fried chicken Since then, she try to cut back on salt intake and greasy food and her symptoms has completely gone away She gave me several blood pressure reading from May 13 to May 17 The highest documented blood pressure was in May 13 with 142/83 Since then, her blood pressure control has improved, with systolic blood pressure around 258-527 and diastolic in the 78E She have no side effects from lisinopril  SUMMARY OF ONCOLOGIC HISTORY: Oncology History Overview Note  Neg genetics   Adenocarcinoma of right fallopian tube ([place])  08/12/2016 Initial Diagnosis   The patient has many months of vague symptoms of fullness in the pelvis, urinary frequency and difficulty with defecation. She denies vaginal bleeding or rectal bleeding.    08/12/2016 Imaging   Abdomen X-ray in the ER: Moderate colonic stool burden without evidence of enteric obstruction   11/13/2016 Imaging   TVUS was performed on 11/13/16 which showed a large hypoechoic lobular mass seen posterior to the uterus measuring 18.2x16.1x11.8cm, blood flow was seen along the periphery of the mass. It was considered to be likely to be a fibroid vs pelvic mass vs ovarian mass. Neither ovary was removed. Moderate amount of free fluid in the pelvis. THe uterus measures 4x10x4cm. The endometrium is 40m.    12/05/2016 Imaging   MR pelvis 1. Mild limitations as detailed above. 2. Heterogeneous pelvic mass is favored to arise from the right ovary. Favor a solid ovarian neoplasm such as fibroma/Brenner's tumor. Given lesion size and heterogeneous T2 signal out, complicating torsion cannot be excluded. 3. Moderate abdominopelvic ascites, without specific evidence of peritoneal metastasis. Consider further  evaluation with contrast-enhanced abdominopelvic CT.    12/26/2016 Pathology Results   1. Uterus,  ovaries and fallopian tubes - ADENOCARCINOMA INVOLVING RIGHT FALLOPIAN TUBE, RIGHT AND LEFT OVARIES, UTERINE SEROSA AND PELVIC MASS. - SEE ONCOLOGY TABLE AND COMMENT. - UTERINE CERVIX, ENDOMETRIUM, MYOMETRIUM AND LEFT FALLOPIAN TUBE FREE OF TUMOR. 2. Omentum, resection for tumor - ADENOCARCINOMA. Microscopic Comment 1. ONCOLOGY TABLE - FALLOPIAN TUBE. SEE COMMENT 1. Specimen, including laterality: Uterus, bilateral adnexa, pelvic mass and omentum. 2. Procedure: Hysterectomy with bilateral salpingo-oophorectomy, pelvic mass excision and omentectomy. 3. Lymph node sampling performed: No 4. Tumor site: See comment. 5. Tumor location in fallopian tube: Right fallopian tube fimbria 6. Specimen integrity (intact/ruptured/disrupted): Intact 7. Tumor size (cm): 18 cm, see comment. 8. Histologic type: Adenocarcinoma 9. Grade: High grade 10. Microscopic tumor extension: Tumor involves right fallopian tube, right and left ovaries, uterine serosa, pelvic mass and omentum. 11. Margins: See comment. 12. Lymph-Vascular invasion: Present 13. Lymph nodes: # examined: 0; # positive: N/A 14. TNM: pT3c, pNX 15. FIGO Stage (based on pathologic findings, needs clinical correlation: III-C 16. Comments: There is an 18 cm pelvic mass which is a high grade adenocarcinoma and there is a 12.2 cm  segment of omentum which is extensively involved with adenocarcinoma. The tumor also involves the fimbria of the right fallopian tube and the parenchyma of the right and left ovaries as well as uterine serosa. The fimbria of the right fallopian has intraepithelial atypia consistent with a precursor lesion and therefore this is most consistent with primary fallopian tube adenocarcinoma. A primary peritoneal serous carcinoma is a less likely possibility.    12/26/2016 Pathology Results   PERITONEAL/ASCITIC FLUID(SPECIMEN 1 OF 1 COLLECTED 12/26/16): POORLY DIFFERENTIATED ADENOCARCINOMA   12/26/2016 Surgery   Procedure(s)  Performed: Exploratory laparotomy with total abdominal hysterectomy, bilateral salpingo-oophorectomy, omentectomy radical tumor debulking for ovarian cancer .  Surgeon: Thereasa Solo, MD.   Operative Findings: 20cm left ovarian mass densely adherent to the posterior uterus, right tube and ovary, cervix, sigmoid colon. 10cm omental cake. 4L ascites. 56m size tumor nodules on the serosa of the terminal ileum and proximal sigmoid colon. 180mnodules on right diaphragm.    This represented an optimal cytoreduction (R1) with 56m43modules on intestine and diaphragm representing gross visible disease   01/16/2017 Procedure   Placement of single lumen port a cath via right internal jugular vein. The catheter tip lies at the cavo-atrial junction. A power injectable port a cath was placed and is ready for immediate use.   01/17/2017 Imaging   1. 3.5 x 7.2 x 4.6 cm fluid collection along the vaginal cuff, posterior the bladder and extending into the right adnexal space. This lesion demonstrates rim enhancement. Imaging features could be related to a loculated postoperative seroma or hematoma. Superinfection cannot be excluded by CT. Given debulking surgery was 3 weeks ago, this entire structure is un likely to represent neoplasm, but peritoneal involvement could have this appearance. 2. Small fluid collection in the left para colic gutter without rim enhancement. 3. Irregular/nodular appearance of the peritoneal M in the anatomic pelvis with areas of subtle nodularity between the stomach and the spleen. Close attention in these regions on follow-up recommended as metastatic disease is a concern. 4. Mildly enlarged hepatoduodenal ligament lymph node. Attention on follow-up recommended.   01/20/2017 Tumor Marker   Patient's tumor was tested for the following markers: CA-125 Results of the tumor marker test revealed 46.6   01/28/2017 Genetic Testing  Negative genetic testing on the [facility] panel.   The [facility] gene panel offered by [employer] includes sequencing and deletion/duplication testing of the following 28 genes: APC, ATM, BARD1, BMPR1A, BRCA1, BRCA2, BRIP1, CHD1, CDK4, CDKN2A, CHEK2, EPCAM (large rearrangement only), MLH1, MSH2, MSH6, MUTYH, NBN, PALB2, PMS2, PTEN, RAD51C, RAD51D, SMAD4, STK11, and TP53. Sequencing was performed for select regions of POLE and POLD1, and large rearrangement analysis was performed for select regions of GREM1. The report date is January 28, 2017.  HRD testing looking for genomic instability and BRCA mutations was negative.  The report date of this test is January 27, 2017.    01/31/2017 Tumor Marker   Patient's tumor was tested for the following markers: CA-125 Results of the tumor marker test revealed 22.4   03/04/2017 Tumor Marker   Patient's tumor was tested for the following markers: CA-125 Results of the tumor marker test revealed 13.8   04/18/2017 Tumor Marker   Patient's tumor was tested for the following markers: CA-125 Results of the tumor marker test revealed 11.9   05/05/2017 Tumor Marker   Patient's tumor was tested for the following markers: CA-125 Results of the tumor marker test revealed 12   05/26/2017 Tumor Marker   Patient's tumor was tested for the following markers: CA-125 Results of the tumor marker test revealed 10.6   05/26/2017 Imaging   1. A previously noted postoperative fluid collection in the low pelvis and residual ascites seen on the prior examination has resolved on today's study. No findings to suggest residual/recurrent disease on today's examination. No definite solid organ metastasis identified in the abdomen or pelvis. No lymphadenopathy. 2. Aortic atherosclerosis.   06/10/2017 Procedure   Successful right IJ vein Port-A-Cath explant.   03/09/2018 Tumor Marker   Patient's tumor was tested for the following markers: CA-125 Results of the tumor marker test revealed 12.1   05/26/2018 Tumor Marker    Patient's tumor was tested for the following markers: CA-125 Results of the tumor marker test revealed 13.8   09/09/2018 Tumor Marker   Patient's tumor was tested for the following markers: CA-125 Results of the tumor marker test revealed 23.9   12/18/2018 Tumor Marker   Patient's tumor was tested for the following markers: CA-125 Results of the tumor marker test revealed 43.8   02/27/2019 - 02/28/2019 Hospital Admission   She was admitted for bowel obstruction   02/27/2019 Imaging   1. High-grade small bowel obstruction with transition point in the pelvis in an area of suspected desmoplastic reaction surrounding a 1.3 cm spiculated nodule in the mesentery, suspicious for carcinoid. There is hyperenhancement near the tip of the appendix and loss of the normal fat plane between it and the adjacent sigmoid colon, potentially the site of primary lesion. 2. Multiple new small subcentimeter hyperdense lesions scattered along the liver capsule, concerning for metastases. Enlarged hyperenhancing gastrohepatic and portacaval lymph nodes concerning for nodal metastases. 3. Very mild right hydroureteronephrosis to the level of the desmoplastic reaction in the pelvis, potentially involving the right ureter. 4. Infraumbilical ventral abdominal wall diastasis containing a small nondilated portion of transverse colon. 5. Trace ascites. 6. Cholelithiasis. 7.  Aortic atherosclerosis (ICD10-I70.0).     03/05/2019 Tumor Marker   Patient's tumor was tested for the following markers: CA-125 Results of the tumor marker test revealed 70.1   03/12/2019 Echocardiogram   IMPRESSIONS     1. Left ventricular ejection fraction, by visual estimation, is 60 to 65%. The left ventricle has normal function. There  is no left ventricular hypertrophy.  2. Left ventricular diastolic parameters are consistent with Grade I diastolic dysfunction (impaired relaxation).  3. Global right ventricle has normal systolic  function.The right ventricular size is normal. No increase in right ventricular wall thickness.  4. Left atrial size was normal.  5. Right atrial size was normal.  6. The pericardium was not assessed.  7. The mitral valve is normal in structure. No evidence of mitral valve regurgitation.  8. The tricuspid valve is normal in structure. Tricuspid valve regurgitation is trivial.  9. The aortic valve is normal in structure. Aortic valve regurgitation is not visualized. 10. The pulmonic valve was grossly normal. Pulmonic valve regurgitation is not visualized. 11. The average left ventricular global longitudinal strain is -17.6 %.   03/16/2019 Procedure   Successful placement of a right internal jugular approach power injectable Port-A-Cath. The catheter is ready for immediate use.     04/19/2019 Tumor Marker   Patient's tumor was tested for the following markers: CA-125 Results of the tumor marker test revealed 39.8   05/17/2019 Tumor Marker   Patient's tumor was tested for the following markers: CA-125 Results of the tumor marker test revealed 27   05/28/2019 Tumor Marker   Patient's tumor was tested for the following markers: CA-125 Results of the tumor marker test revealed 27.7.   06/11/2019 Imaging   1. No new or progressive metastatic disease in the abdomen or pelvis. 2. Tiny noncalcified perisplenic implant is decreased. Calcified pericardiophrenic, retroperitoneal and bilateral inguinal lymph nodes and scattered small calcified perihepatic and perisplenic implants are stable. 3.  Aortic Atherosclerosis (ICD10-I70.0).       06/17/2019 Echocardiogram    1. Left ventricular ejection fraction, by estimation, is 65 to 70%. The left ventricle has normal function. The left ventricle has no regional wall motion abnormalities. Left ventricular diastolic parameters were normal.  2. Right ventricular systolic function is normal. The right ventricular size is normal.  3. The mitral valve is  normal in structure and function. No evidence of mitral valve regurgitation. No evidence of mitral stenosis.  4. The aortic valve is normal in structure and function. Aortic valve regurgitation is not visualized. No aortic stenosis is present.   06/21/2019 Tumor Marker   Patient's tumor was tested for the following markers: CA-125 Results of the tumor marker test revealed 18.7   07/19/2019 Tumor Marker   Patient's tumor was tested for the following markers: CA-125 Results of the tumor marker test revealed 16.7   08/30/2019 Tumor Marker   Patient's tumor was tested for the following markers: CA-125. Results of the tumor marker test revealed 13.9   09/15/2019 Echocardiogram    1. Left ventricular ejection fraction, by estimation, is 65 to 70%. The left ventricle has normal function. The left ventricle has no regional wall motion abnormalities. Left ventricular diastolic parameters are consistent with Grade I diastolic dysfunction (impaired relaxation). The average left ventricular global longitudinal strain is 18.1 %. The global longitudinal strain is normal.  2. Right ventricular systolic function is normal. The right ventricular size is normal.  3. The mitral valve is normal in structure. No evidence of mitral valve regurgitation. No evidence of mitral stenosis.  4. The aortic valve is normal in structure. Aortic valve regurgitation is not visualized. No aortic stenosis is present.  5. The inferior vena cava is normal in size with greater than 50% respiratory variability, suggesting right atrial pressure of 3 mmHg.     09/24/2019 Imaging   1. Stable  exam. No new or progressive metastatic disease on today's study. 2. Small subcapsular hypodensity in the lateral spleen, new in the interval, but indeterminate. Attention on follow-up recommended. 3. The calcified upper abdominal and groin lymph nodes are stable as are the scattered calcified and noncalcified peritoneal implants. 4. Stable midline  ventral hernia containing a short segment of colon without complicating features. 5. Aortic Atherosclerosis (ICD10-I70.0).     10/04/2019 -  Chemotherapy    Patient is on Treatment Plan: OVARIAN BEVACIZUMAB Q21D      10/04/2019 Tumor Marker   Patient's tumor was tested for the following markers: CA-125. Results of the tumor marker test revealed 12.9.   11/01/2019 Tumor Marker   Patient's tumor was tested for the following markers: CA-125 Results of the tumor marker test revealed 15.4   12/13/2019 Tumor Marker   Patient's tumor was tested for the following markers: CA-125 Results of the tumor marker test revealed 14.8   12/31/2019 Imaging   1. Unchanged tiny peritoneal nodule adjacent to the spleen measuring 6 mm. Other previously noted peritoneal nodules are imperceptible on present examination. 2. Unchanged prominent, partially calcified portacaval lymph node and bilateral inguinal lymph nodes. 3. Unchanged subtle, nonspecific subcapsular lesion of the spleen.  4. No evidence of new metastatic disease in the abdomen or pelvis. 5. Status post hysterectomy. 6. Unchanged low midline ventral abdominal hernia containing a single loop of nonobstructed transverse colon. 7. Cholelithiasis. 8. Aortic Atherosclerosis (ICD10-I70.0).       01/03/2020 Tumor Marker   Patient's tumor was tested for the following markers: CA-125 Results of the tumor marker test revealed 11.7.   01/24/2020 Tumor Marker   Patient's tumor was tested for the following markers: CA-125. Results of the tumor marker test revealed 15.1   03/06/2020 Tumor Marker   Patient's tumor was tested for the following markers: CA-125. Results of the tumor marker test revealed 20.3   03/27/2020 Tumor Marker   Patient's tumor was tested for the following markers: CA-125 Results of the tumor marker test revealed 23.7   05/11/2020 Tumor Marker   Patient's tumor was tested for the following markers: CA-125. Results of the tumor  marker test revealed 25.4   06/23/2020 Imaging   1. Cholelithiasis with gallbladder wall thickening and mild infiltrative edema in the porta hepatis, raising the possibility of acute cholecystitis. There is truncation of the common bile duct distally and distal choledocholithiasis is difficult to exclude. Correlate with bilirubin levels. If clinically warranted, MRCP could be utilized for further characterization. 2. Stable tiny perisplenic nodule. 3. Other imaging findings of potential clinical significance: Small type 1 hiatal hernia. Prominent stool throughout the colon favors constipation. Ventral infraumbilical hernia contains transverse colon without findings of strangulation or obstruction. Small supraumbilical hernia contains adipose tissue. Lumbar degenerative disc disease at L5-S1. Stable tiny nodule along the lateral margin of the spleen, nonspecific. 4. Aortic atherosclerosis.       REVIEW OF SYSTEMS:   Constitutional: Denies fevers, chills or abnormal weight loss Eyes: Denies blurriness of vision Ears, nose, mouth, throat, and face: Denies mucositis or sore throat Respiratory: Denies cough, dyspnea or wheezes Cardiovascular: Denies palpitation, chest discomfort Gastrointestinal:  Denies nausea, heartburn or change in bowel habits Skin: Denies abnormal skin rashes Lymphatics: Denies new lymphadenopathy or easy bruising Neurological:Denies numbness, tingling or new weaknesses Behavioral/Psych: Mood is stable, no new changes  Extremities: No lower extremity edema All other systems were reviewed with the patient and are negative.  I have reviewed the past medical history, past  surgical history, social history and family history with the patient and they are unchanged from previous note.  ALLERGIES:  is allergic to dilaudid [hydromorphone hcl], morphine and related, and sudafed [pseudoephedrine hcl].  MEDICATIONS:  Current Outpatient Medications  Medication Sig Dispense Refill   . acetaminophen (TYLENOL) 325 MG tablet Take 975 mg by mouth every 6 (six) hours as needed (pain).    Marland Kitchen amLODipine (NORVASC) 10 MG tablet TAKE 1 TABLET(10 MG) BY MOUTH DAILY 30 tablet 9  . lidocaine-prilocaine (EMLA) cream APP EXT AA 1 TIME    . lisinopril (ZESTRIL) 20 MG tablet Take 1 tablet (20 mg total) by mouth daily. 30 tablet 3  . ondansetron (ZOFRAN) 8 MG tablet Take 1 tablet (8 mg total) by mouth every 8 (eight) hours as needed. 30 tablet 1  . prochlorperazine (COMPAZINE) 10 MG tablet TAKE 1 TABLET BY MOUTH EVERY 6 HOURS AS NEEDED FOR NAUSEA AND/OR VOMITING 30 tablet 1  . venlafaxine XR (EFFEXOR-XR) 37.5 MG 24 hr capsule TAKE 1 CAPSULE BY MOUTH DAILY WITH BREAKFAST 90 capsule 3   No current facility-administered medications for this visit.   Facility-Administered Medications Ordered in Other Visits  Medication Dose Route Frequency Provider Last Rate Last Admin  . sodium chloride flush (NS) 0.9 % injection 10 mL  10 mL Intravenous PRN Alvy Bimler, Simora Dingee, MD   10 mL at 02/11/17 0839  . sodium chloride flush (NS) 0.9 % injection 10 mL  10 mL Intravenous PRN Alvy Bimler, Donnalee Cellucci, MD   10 mL at 03/04/17 1510    PHYSICAL EXAMINATION: ECOG PERFORMANCE STATUS: 1 - Symptomatic but completely ambulatory  LABORATORY DATA:  I have reviewed the data as listed CMP Latest Ref Rng & Units 08/28/2020 08/07/2020 07/17/2020  Glucose 70 - 99 mg/dL 136(H) 123(H) 138(H)  BUN 8 - 23 mg/dL '14 20 17  ' Creatinine 0.44 - 1.00 mg/dL 1.13(H) 1.12(H) 1.09(H)  Sodium 135 - 145 mmol/L 140 140 139  Potassium 3.5 - 5.1 mmol/L 4.0 4.1 3.9  Chloride 98 - 111 mmol/L 104 105 106  CO2 22 - 32 mmol/L '23 24 22  ' Calcium 8.9 - 10.3 mg/dL 8.9 8.9 8.7(L)  Total Protein 6.5 - 8.1 g/dL 6.6 6.5 6.8  Total Bilirubin 0.3 - 1.2 mg/dL 0.6 0.5 0.5  Alkaline Phos 38 - 126 U/L 56 60 68  AST 15 - 41 U/L '16 16 15  ' ALT 0 - 44 U/L '8 9 13    ' Lab Results  Component Value Date   WBC 5.3 08/28/2020   HGB 12.7 08/28/2020   HCT 39.2 08/28/2020   MCV  94.9 08/28/2020   PLT 165 08/28/2020   NEUTROABS 3.1 08/28/2020     I discussed the assessment and treatment plan with the patient. The patient was provided an opportunity to ask questions and all were answered. The patient agreed with the plan and demonstrated an understanding of the instructions. The patient was advised to call back or seek an in-person evaluation if the symptoms worsen or if the condition fails to improve as anticipated.    I spent 20 minutes for the appointment reviewing test results, discuss management and coordination of care.  Heath Lark, MD 09/07/2020 9:43 AM

## 2020-09-07 NOTE — Assessment & Plan Note (Signed)
Her documented blood pressure over the last 3 days were satisfactory, with systolic blood pressure ranging between 213-086 and diastolic between 57-84 She denies headache or other symptoms such as blurriness of vision She will continue close monitoring of her blood pressure and she will take her current prescribed medications

## 2020-09-07 NOTE — Assessment & Plan Note (Signed)
Her blood pressure is better controlled with the addition of lisinopril We discussed the risk and benefits of holding bevacizumab, if she is interested to proceed with cholecystectomy Ultimately, her plan would be to resume treatment as scheduled next month She is contemplating applying for permanent disability due to her incurable cancer

## 2020-09-25 ENCOUNTER — Encounter: Payer: Self-pay | Admitting: Hematology and Oncology

## 2020-09-25 ENCOUNTER — Other Ambulatory Visit: Payer: Self-pay

## 2020-09-25 ENCOUNTER — Inpatient Hospital Stay: Payer: BC Managed Care – PPO

## 2020-09-25 ENCOUNTER — Inpatient Hospital Stay (HOSPITAL_BASED_OUTPATIENT_CLINIC_OR_DEPARTMENT_OTHER): Payer: BC Managed Care – PPO | Admitting: Hematology and Oncology

## 2020-09-25 ENCOUNTER — Other Ambulatory Visit (HOSPITAL_COMMUNITY): Payer: Self-pay

## 2020-09-25 ENCOUNTER — Inpatient Hospital Stay: Payer: BC Managed Care – PPO | Attending: Hematology and Oncology

## 2020-09-25 DIAGNOSIS — R7989 Other specified abnormal findings of blood chemistry: Secondary | ICD-10-CM

## 2020-09-25 DIAGNOSIS — C786 Secondary malignant neoplasm of retroperitoneum and peritoneum: Secondary | ICD-10-CM | POA: Diagnosis not present

## 2020-09-25 DIAGNOSIS — C5701 Malignant neoplasm of right fallopian tube: Secondary | ICD-10-CM

## 2020-09-25 DIAGNOSIS — Z7189 Other specified counseling: Secondary | ICD-10-CM

## 2020-09-25 DIAGNOSIS — Z5112 Encounter for antineoplastic immunotherapy: Secondary | ICD-10-CM | POA: Insufficient documentation

## 2020-09-25 DIAGNOSIS — C562 Malignant neoplasm of left ovary: Secondary | ICD-10-CM

## 2020-09-25 DIAGNOSIS — I10 Essential (primary) hypertension: Secondary | ICD-10-CM | POA: Diagnosis not present

## 2020-09-25 DIAGNOSIS — C569 Malignant neoplasm of unspecified ovary: Secondary | ICD-10-CM

## 2020-09-25 DIAGNOSIS — Z79899 Other long term (current) drug therapy: Secondary | ICD-10-CM | POA: Diagnosis not present

## 2020-09-25 LAB — COMPREHENSIVE METABOLIC PANEL
ALT: 10 U/L (ref 0–44)
AST: 16 U/L (ref 15–41)
Albumin: 3.8 g/dL (ref 3.5–5.0)
Alkaline Phosphatase: 58 U/L (ref 38–126)
Anion gap: 13 (ref 5–15)
BUN: 21 mg/dL (ref 8–23)
CO2: 21 mmol/L — ABNORMAL LOW (ref 22–32)
Calcium: 9.5 mg/dL (ref 8.9–10.3)
Chloride: 105 mmol/L (ref 98–111)
Creatinine, Ser: 1.16 mg/dL — ABNORMAL HIGH (ref 0.44–1.00)
GFR, Estimated: 52 mL/min — ABNORMAL LOW (ref >60)
Glucose, Bld: 147 mg/dL — ABNORMAL HIGH (ref 70–99)
Potassium: 4 mmol/L (ref 3.5–5.1)
Sodium: 139 mmol/L (ref 135–145)
Total Bilirubin: 0.4 mg/dL (ref 0.3–1.2)
Total Protein: 7 g/dL (ref 6.5–8.1)

## 2020-09-25 LAB — CBC WITH DIFFERENTIAL/PLATELET
Abs Immature Granulocytes: 0.02 10*3/uL (ref 0.00–0.07)
Basophils Absolute: 0.1 10*3/uL (ref 0.0–0.1)
Basophils Relative: 1 %
Eosinophils Absolute: 0.5 10*3/uL (ref 0.0–0.5)
Eosinophils Relative: 9 %
HCT: 41.1 % (ref 36.0–46.0)
Hemoglobin: 13.4 g/dL (ref 12.0–15.0)
Immature Granulocytes: 0 %
Lymphocytes Relative: 27 %
Lymphs Abs: 1.6 10*3/uL (ref 0.7–4.0)
MCH: 31.2 pg (ref 26.0–34.0)
MCHC: 32.6 g/dL (ref 30.0–36.0)
MCV: 95.8 fL (ref 80.0–100.0)
Monocytes Absolute: 0.6 10*3/uL (ref 0.1–1.0)
Monocytes Relative: 10 %
Neutro Abs: 3.1 10*3/uL (ref 1.7–7.7)
Neutrophils Relative %: 53 %
Platelets: 171 10*3/uL (ref 150–400)
RBC: 4.29 MIL/uL (ref 3.87–5.11)
RDW: 13.3 % (ref 11.5–15.5)
WBC: 5.9 10*3/uL (ref 4.0–10.5)
nRBC: 0 % (ref 0.0–0.2)

## 2020-09-25 LAB — TOTAL PROTEIN, URINE DIPSTICK: Protein, ur: NEGATIVE mg/dL

## 2020-09-25 MED ORDER — SODIUM CHLORIDE 0.9 % IV SOLN
Freq: Once | INTRAVENOUS | Status: AC
  Filled 2020-09-25: qty 250

## 2020-09-25 MED ORDER — SODIUM CHLORIDE 0.9 % IV SOLN
10.0000 mg/kg | Freq: Once | INTRAVENOUS | Status: AC
  Administered 2020-09-25: 800 mg via INTRAVENOUS
  Filled 2020-09-25: qty 32

## 2020-09-25 MED ORDER — ONDANSETRON HCL 8 MG PO TABS
8.0000 mg | ORAL_TABLET | Freq: Three times a day (TID) | ORAL | 1 refills | Status: DC | PRN
  Filled 2020-09-25: qty 30, 10d supply, fill #0
  Filled 2021-08-31: qty 30, 10d supply, fill #1

## 2020-09-25 MED ORDER — TRAMADOL HCL 50 MG PO TABS
50.0000 mg | ORAL_TABLET | Freq: Four times a day (QID) | ORAL | 0 refills | Status: DC | PRN
  Filled 2020-09-25: qty 30, 7d supply, fill #0

## 2020-09-25 MED ORDER — LIDOCAINE-PRILOCAINE 2.5-2.5 % EX CREA
TOPICAL_CREAM | CUTANEOUS | 3 refills | Status: DC
  Filled 2020-09-25: qty 30, 30d supply, fill #0

## 2020-09-25 MED ORDER — PROCHLORPERAZINE MALEATE 10 MG PO TABS
ORAL_TABLET | Freq: Four times a day (QID) | ORAL | 1 refills | Status: DC | PRN
  Filled 2020-09-25: qty 30, 7d supply, fill #0
  Filled 2021-08-31: qty 30, 7d supply, fill #1

## 2020-09-25 MED ORDER — HEPARIN SOD (PORK) LOCK FLUSH 100 UNIT/ML IV SOLN
500.0000 [IU] | Freq: Once | INTRAVENOUS | Status: AC | PRN
  Administered 2020-09-25: 500 [IU]
  Filled 2020-09-25: qty 5

## 2020-09-25 MED ORDER — SODIUM CHLORIDE 0.9% FLUSH
10.0000 mL | Freq: Once | INTRAVENOUS | Status: AC
Start: 2020-09-25 — End: 2020-09-25
  Administered 2020-09-25: 10 mL
  Filled 2020-09-25: qty 10

## 2020-09-25 MED ORDER — SODIUM CHLORIDE 0.9% FLUSH
10.0000 mL | INTRAVENOUS | Status: DC | PRN
  Administered 2020-09-25: 10 mL
  Filled 2020-09-25: qty 10

## 2020-09-25 NOTE — Assessment & Plan Note (Addendum)
She has intermittent elevated serum creatinine Observe closely for now She is out in the sun quite a bit I recommend increase fluid hydration as tolerated

## 2020-09-25 NOTE — Progress Notes (Signed)
[place] OFFICE PROGRESS NOTE  Patient Care Team: Gaynelle Arabian, MD as PCP - General (Family Medicine) Heath Lark, MD as Consulting Physician (Hematology and Oncology) Everitt Amber, MD as Consulting Physician (Obstetrics and Gynecology)  ASSESSMENT & PLAN:  Adenocarcinoma of right fallopian tube [facility]) Her blood pressure is better controlled with the addition of lisinopril She has no recent right upper quadrant pain She have no other symptoms We will proceed with treatment as scheduled I plan to repeat imaging study in September  Essential hypertension Her documented blood pressure over few weeks has been good  she denies headache or other symptoms such as blurriness of vision She will continue close monitoring of her blood pressure and she will take her current prescribed medications  Elevated serum creatinine She has intermittent elevated serum creatinine Observe closely for now She is out in the sun quite a bit I recommend increase fluid hydration as tolerated   No orders of the defined types were placed in this encounter.   All questions were answered. The patient knows to call the clinic with any problems, questions or concerns. The total time spent in the appointment was 20 minutes encounter with patients including review of chart and various tests results, discussions about plan of care and coordination of care plan   Heath Lark, MD 09/25/2020 8:49 AM  INTERVAL HISTORY: Please see below for problem oriented charting. She returns for treatment and follow-up She is doing well No side effects from treatment so far Blood pressure is doing well at home Denies abdominal pain, nausea or changes in bowel habits  SUMMARY OF ONCOLOGIC HISTORY: Oncology History Overview Note  Neg genetics   Adenocarcinoma of right fallopian tube ([place])  08/12/2016 Initial Diagnosis   The patient has many months of vague symptoms of fullness in the pelvis, urinary frequency  and difficulty with defecation. She denies vaginal bleeding or rectal bleeding.    08/12/2016 Imaging   Abdomen X-ray in the ER: Moderate colonic stool burden without evidence of enteric obstruction   11/13/2016 Imaging   TVUS was performed on 11/13/16 which showed a large hypoechoic lobular mass seen posterior to the uterus measuring 18.2x16.1x11.8cm, blood flow was seen along the periphery of the mass. It was considered to be likely to be a fibroid vs pelvic mass vs ovarian mass. Neither ovary was removed. Moderate amount of free fluid in the pelvis. THe uterus measures 4x10x4cm. The endometrium is 80m.    12/05/2016 Imaging   MR pelvis 1. Mild limitations as detailed above. 2. Heterogeneous pelvic mass is favored to arise from the right ovary. Favor a solid ovarian neoplasm such as fibroma/Brenner's tumor. Given lesion size and heterogeneous T2 signal out, complicating torsion cannot be excluded. 3. Moderate abdominopelvic ascites, without specific evidence of peritoneal metastasis. Consider further evaluation with contrast-enhanced abdominopelvic CT.    12/26/2016 Pathology Results   1. Uterus, ovaries and fallopian tubes - ADENOCARCINOMA INVOLVING RIGHT FALLOPIAN TUBE, RIGHT AND LEFT OVARIES, UTERINE SEROSA AND PELVIC MASS. - SEE ONCOLOGY TABLE AND COMMENT. - UTERINE CERVIX, ENDOMETRIUM, MYOMETRIUM AND LEFT FALLOPIAN TUBE FREE OF TUMOR. 2. Omentum, resection for tumor - ADENOCARCINOMA. Microscopic Comment 1. ONCOLOGY TABLE - FALLOPIAN TUBE. SEE COMMENT 1. Specimen, including laterality: Uterus, bilateral adnexa, pelvic mass and omentum. 2. Procedure: Hysterectomy with bilateral salpingo-oophorectomy, pelvic mass excision and omentectomy. 3. Lymph node sampling performed: No 4. Tumor site: See comment. 5. Tumor location in fallopian tube: Right fallopian tube fimbria 6. Specimen integrity (intact/ruptured/disrupted): Intact 7. Tumor size (  cm): 18 cm, see comment. 8. Histologic type:  Adenocarcinoma 9. Grade: High grade 10. Microscopic tumor extension: Tumor involves right fallopian tube, right and left ovaries, uterine serosa, pelvic mass and omentum. 11. Margins: See comment. 12. Lymph-Vascular invasion: Present 13. Lymph nodes: # examined: 0; # positive: N/A 14. TNM: pT3c, pNX 15. FIGO Stage (based on pathologic findings, needs clinical correlation: III-C 16. Comments: There is an 18 cm pelvic mass which is a high grade adenocarcinoma and there is a 12.2 cm  segment of omentum which is extensively involved with adenocarcinoma. The tumor also involves the fimbria of the right fallopian tube and the parenchyma of the right and left ovaries as well as uterine serosa. The fimbria of the right fallopian has intraepithelial atypia consistent with a precursor lesion and therefore this is most consistent with primary fallopian tube adenocarcinoma. A primary peritoneal serous carcinoma is a less likely possibility.    12/26/2016 Pathology Results   PERITONEAL/ASCITIC FLUID(SPECIMEN 1 OF 1 COLLECTED 12/26/16): POORLY DIFFERENTIATED ADENOCARCINOMA   12/26/2016 Surgery   Procedure(s) Performed: Exploratory laparotomy with total abdominal hysterectomy, bilateral salpingo-oophorectomy, omentectomy radical tumor debulking for ovarian cancer .  Surgeon: Thereasa Solo, MD.   Operative Findings: 20cm left ovarian mass densely adherent to the posterior uterus, right tube and ovary, cervix, sigmoid colon. 10cm omental cake. 4L ascites. 72m size tumor nodules on the serosa of the terminal ileum and proximal sigmoid colon. 170mnodules on right diaphragm.    This represented an optimal cytoreduction (R1) with 95m995modules on intestine and diaphragm representing gross visible disease   01/16/2017 Procedure   Placement of single lumen port a cath via right internal jugular vein. The catheter tip lies at the cavo-atrial junction. A power injectable port a cath was placed and is ready for immediate  use.   01/17/2017 Imaging   1. 3.5 x 7.2 x 4.6 cm fluid collection along the vaginal cuff, posterior the bladder and extending into the right adnexal space. This lesion demonstrates rim enhancement. Imaging features could be related to a loculated postoperative seroma or hematoma. Superinfection cannot be excluded by CT. Given debulking surgery was 3 weeks ago, this entire structure is un likely to represent neoplasm, but peritoneal involvement could have this appearance. 2. Small fluid collection in the left para colic gutter without rim enhancement. 3. Irregular/nodular appearance of the peritoneal M in the anatomic pelvis with areas of subtle nodularity between the stomach and the spleen. Close attention in these regions on follow-up recommended as metastatic disease is a concern. 4. Mildly enlarged hepatoduodenal ligament lymph node. Attention on follow-up recommended.   01/20/2017 Tumor Marker   Patient's tumor was tested for the following markers: CA-125 Results of the tumor marker test revealed 46.6   01/28/2017 Genetic Testing       Negative genetic testing on the [facility].  The [facility] panel offered by [employer] sequencing and deletion/duplication testing of the following 28 genes: APC, ATM, BARD1, BMPR1A, BRCA1, BRCA2, BRIP1, CHD1, CDK4, CDKN2A, CHEK2, EPCAM (large rearrangement only), MLH1, MSH2, MSH6, MUTYH, NBN, PALB2, PMS2, PTEN, RAD51C, RAD51D, SMAD4, STK11, and TP53. Sequencing was performed for select regions of POLE and POLD1, and large rearrangement analysis was performed for select regions of GREM1. The report date is January 28, 2017.  HRD testing looking for genomic instability and BRCA mutations was negative.  The report date of this test is January 27, 2017.    01/31/2017 Tumor Marker   Patient's tumor was  tested for the following markers: CA-125 Results of the tumor marker test revealed 22.4   03/04/2017 Tumor Marker   Patient's  tumor was tested for the following markers: CA-125 Results of the tumor marker test revealed 13.8   04/18/2017 Tumor Marker   Patient's tumor was tested for the following markers: CA-125 Results of the tumor marker test revealed 11.9   05/05/2017 Tumor Marker   Patient's tumor was tested for the following markers: CA-125 Results of the tumor marker test revealed 12   05/26/2017 Tumor Marker   Patient's tumor was tested for the following markers: CA-125 Results of the tumor marker test revealed 10.6   05/26/2017 Imaging   1. A previously noted postoperative fluid collection in the low pelvis and residual ascites seen on the prior examination has resolved on today's study. No findings to suggest residual/recurrent disease on today's examination. No definite solid organ metastasis identified in the abdomen or pelvis. No lymphadenopathy. 2. Aortic atherosclerosis.   06/10/2017 Procedure   Successful right IJ vein Port-A-Cath explant.   03/09/2018 Tumor Marker   Patient's tumor was tested for the following markers: CA-125 Results of the tumor marker test revealed 12.1   05/26/2018 Tumor Marker   Patient's tumor was tested for the following markers: CA-125 Results of the tumor marker test revealed 13.8   09/09/2018 Tumor Marker   Patient's tumor was tested for the following markers: CA-125 Results of the tumor marker test revealed 23.9   12/18/2018 Tumor Marker   Patient's tumor was tested for the following markers: CA-125 Results of the tumor marker test revealed 43.8   02/27/2019 - 02/28/2019 Hospital Admission   She was admitted for bowel obstruction   02/27/2019 Imaging   1. High-grade small bowel obstruction with transition point in the pelvis in an area of suspected desmoplastic reaction surrounding a 1.3 cm spiculated nodule in the mesentery, suspicious for carcinoid. There is hyperenhancement near the tip of the appendix and loss of the normal fat plane between it and the adjacent  sigmoid colon, potentially the site of primary lesion. 2. Multiple new small subcentimeter hyperdense lesions scattered along the liver capsule, concerning for metastases. Enlarged hyperenhancing gastrohepatic and portacaval lymph nodes concerning for nodal metastases. 3. Very mild right hydroureteronephrosis to the level of the desmoplastic reaction in the pelvis, potentially involving the right ureter. 4. Infraumbilical ventral abdominal wall diastasis containing a small nondilated portion of transverse colon. 5. Trace ascites. 6. Cholelithiasis. 7.  Aortic atherosclerosis (ICD10-I70.0).     03/05/2019 Tumor Marker   Patient's tumor was tested for the following markers: CA-125 Results of the tumor marker test revealed 70.1   03/12/2019 Echocardiogram   IMPRESSIONS     1. Left ventricular ejection fraction, by visual estimation, is 60 to 65%. The left ventricle has normal function. There is no left ventricular hypertrophy.  2. Left ventricular diastolic parameters are consistent with Grade I diastolic dysfunction (impaired relaxation).  3. Global right ventricle has normal systolic function.The right ventricular size is normal. No increase in right ventricular wall thickness.  4. Left atrial size was normal.  5. Right atrial size was normal.  6. The pericardium was not assessed.  7. The mitral valve is normal in structure. No evidence of mitral valve regurgitation.  8. The tricuspid valve is normal in structure. Tricuspid valve regurgitation is trivial.  9. The aortic valve is normal in structure. Aortic valve regurgitation is not visualized. 10. The pulmonic valve was grossly normal. Pulmonic valve regurgitation is not visualized.  11. The average left ventricular global longitudinal strain is -17.6 %.   03/16/2019 Procedure   Successful placement of a right internal jugular approach power injectable Port-A-Cath. The catheter is ready for immediate use.     04/19/2019 Tumor Marker    Patient's tumor was tested for the following markers: CA-125 Results of the tumor marker test revealed 39.8   05/17/2019 Tumor Marker   Patient's tumor was tested for the following markers: CA-125 Results of the tumor marker test revealed 27   05/28/2019 Tumor Marker   Patient's tumor was tested for the following markers: CA-125 Results of the tumor marker test revealed 27.7.   06/11/2019 Imaging   1. No new or progressive metastatic disease in the abdomen or pelvis. 2. Tiny noncalcified perisplenic implant is decreased. Calcified pericardiophrenic, retroperitoneal and bilateral inguinal lymph nodes and scattered small calcified perihepatic and perisplenic implants are stable. 3.  Aortic Atherosclerosis (ICD10-I70.0).       06/17/2019 Echocardiogram    1. Left ventricular ejection fraction, by estimation, is 65 to 70%. The left ventricle has normal function. The left ventricle has no regional wall motion abnormalities. Left ventricular diastolic parameters were normal.  2. Right ventricular systolic function is normal. The right ventricular size is normal.  3. The mitral valve is normal in structure and function. No evidence of mitral valve regurgitation. No evidence of mitral stenosis.  4. The aortic valve is normal in structure and function. Aortic valve regurgitation is not visualized. No aortic stenosis is present.   06/21/2019 Tumor Marker   Patient's tumor was tested for the following markers: CA-125 Results of the tumor marker test revealed 18.7   07/19/2019 Tumor Marker   Patient's tumor was tested for the following markers: CA-125 Results of the tumor marker test revealed 16.7   08/30/2019 Tumor Marker   Patient's tumor was tested for the following markers: CA-125. Results of the tumor marker test revealed 13.9   09/15/2019 Echocardiogram    1. Left ventricular ejection fraction, by estimation, is 65 to 70%. The left ventricle has normal function. The left ventricle has no  regional wall motion abnormalities. Left ventricular diastolic parameters are consistent with Grade I diastolic dysfunction (impaired relaxation). The average left ventricular global longitudinal strain is 18.1 %. The global longitudinal strain is normal.  2. Right ventricular systolic function is normal. The right ventricular size is normal.  3. The mitral valve is normal in structure. No evidence of mitral valve regurgitation. No evidence of mitral stenosis.  4. The aortic valve is normal in structure. Aortic valve regurgitation is not visualized. No aortic stenosis is present.  5. The inferior vena cava is normal in size with greater than 50% respiratory variability, suggesting right atrial pressure of 3 mmHg.     09/24/2019 Imaging   1. Stable exam. No new or progressive metastatic disease on today's study. 2. Small subcapsular hypodensity in the lateral spleen, new in the interval, but indeterminate. Attention on follow-up recommended. 3. The calcified upper abdominal and groin lymph nodes are stable as are the scattered calcified and noncalcified peritoneal implants. 4. Stable midline ventral hernia containing a short segment of colon without complicating features. 5. Aortic Atherosclerosis (ICD10-I70.0).     10/04/2019 -  Chemotherapy    Patient is on Treatment Plan: OVARIAN BEVACIZUMAB Q21D      10/04/2019 Tumor Marker   Patient's tumor was tested for the following markers: CA-125. Results of the tumor marker test revealed 12.9.   11/01/2019 Tumor Marker  Patient's tumor was tested for the following markers: CA-125 Results of the tumor marker test revealed 15.4   12/13/2019 Tumor Marker   Patient's tumor was tested for the following markers: CA-125 Results of the tumor marker test revealed 14.8   12/31/2019 Imaging   1. Unchanged tiny peritoneal nodule adjacent to the spleen measuring 6 mm. Other previously noted peritoneal nodules are imperceptible on present examination. 2.  Unchanged prominent, partially calcified portacaval lymph node and bilateral inguinal lymph nodes. 3. Unchanged subtle, nonspecific subcapsular lesion of the spleen.  4. No evidence of new metastatic disease in the abdomen or pelvis. 5. Status post hysterectomy. 6. Unchanged low midline ventral abdominal hernia containing a single loop of nonobstructed transverse colon. 7. Cholelithiasis. 8. Aortic Atherosclerosis (ICD10-I70.0).       01/03/2020 Tumor Marker   Patient's tumor was tested for the following markers: CA-125 Results of the tumor marker test revealed 11.7.   01/24/2020 Tumor Marker   Patient's tumor was tested for the following markers: CA-125. Results of the tumor marker test revealed 15.1   03/06/2020 Tumor Marker   Patient's tumor was tested for the following markers: CA-125. Results of the tumor marker test revealed 20.3   03/27/2020 Tumor Marker   Patient's tumor was tested for the following markers: CA-125 Results of the tumor marker test revealed 23.7   05/11/2020 Tumor Marker   Patient's tumor was tested for the following markers: CA-125. Results of the tumor marker test revealed 25.4   06/23/2020 Imaging   1. Cholelithiasis with gallbladder wall thickening and mild infiltrative edema in the porta hepatis, raising the possibility of acute cholecystitis. There is truncation of the common bile duct distally and distal choledocholithiasis is difficult to exclude. Correlate with bilirubin levels. If clinically warranted, MRCP could be utilized for further characterization. 2. Stable tiny perisplenic nodule. 3. Other imaging findings of potential clinical significance: Small type 1 hiatal hernia. Prominent stool throughout the colon favors constipation. Ventral infraumbilical hernia contains transverse colon without findings of strangulation or obstruction. Small supraumbilical hernia contains adipose tissue. Lumbar degenerative disc disease at L5-S1. Stable tiny nodule along  the lateral margin of the spleen, nonspecific. 4. Aortic atherosclerosis.       REVIEW OF SYSTEMS:   Constitutional: Denies fevers, chills or abnormal weight loss Eyes: Denies blurriness of vision Ears, nose, mouth, throat, and face: Denies mucositis or sore throat Respiratory: Denies cough, dyspnea or wheezes Cardiovascular: Denies palpitation, chest discomfort or lower extremity swelling Gastrointestinal:  Denies nausea, heartburn or change in bowel habits Skin: Denies abnormal skin rashes Lymphatics: Denies new lymphadenopathy or easy bruising Neurological:Denies numbness, tingling or new weaknesses Behavioral/Psych: Mood is stable, no new changes  All other systems were reviewed with the patient and are negative.  I have reviewed the past medical history, past surgical history, social history and family history with the patient and they are unchanged from previous note.  ALLERGIES:  is allergic to dilaudid [hydromorphone hcl], morphine and related, and sudafed [pseudoephedrine hcl].  MEDICATIONS:  Current Outpatient Medications  Medication Sig Dispense Refill  . traMADol (ULTRAM) 50 MG tablet Take 1 tablet (50 mg total) by mouth every 6 (six) hours as needed. 30 tablet 0  . acetaminophen (TYLENOL) 325 MG tablet Take 975 mg by mouth every 6 (six) hours as needed (pain).    Marland Kitchen amLODipine (NORVASC) 10 MG tablet TAKE 1 TABLET(10 MG) BY MOUTH DAILY 30 tablet 9  . lidocaine-prilocaine (EMLA) cream Apply to affected area once daily as directed. [place]  g 3  . lisinopril (ZESTRIL) 20 MG tablet Take 1 tablet (20 mg total) by mouth daily. 30 tablet 3  . ondansetron (ZOFRAN) 8 MG tablet Take 1 tablet (8 mg total) by mouth every 8 (eight) hours as needed. 30 tablet 1  . prochlorperazine (COMPAZINE) 10 MG tablet TAKE 1 TABLET BY MOUTH EVERY 6 HOURS AS NEEDED FOR NAUSEA AND/OR VOMITING 30 tablet 1  . venlafaxine XR (EFFEXOR-XR) 37.5 MG 24 hr capsule TAKE 1 CAPSULE BY MOUTH DAILY WITH BREAKFAST 90  capsule 3   No current facility-administered medications for this visit.   Facility-Administered Medications Ordered in Other Visits  Medication Dose Route Frequency Provider Last Rate Last Admin  . sodium chloride flush (NS) 0.9 % injection 10 mL  10 mL Intravenous PRN Alvy Bimler, Sofija Antwi, MD   10 mL at 02/11/17 0839  . sodium chloride flush (NS) 0.9 % injection 10 mL  10 mL Intravenous PRN Alvy Bimler, Talecia Sherlin, MD   10 mL at 03/04/17 1510    PHYSICAL EXAMINATION: ECOG PERFORMANCE STATUS: 0 - Asymptomatic  Vitals:   09/25/20 0838  BP: 135/66  Pulse: 77  Resp: 18  Temp: (!) 97.4 F (36.3 C)  SpO2: 100%   Filed Weights   09/25/20 0838  Weight: 168 lb 3.2 oz (76.3 kg)    GENERAL:alert, no distress and comfortable SKIN: skin color, texture, turgor are normal, no rashes or significant lesions EYES: normal, Conjunctiva are pink and non-injected, sclera clear OROPHARYNX:no exudate, no erythema and lips, buccal mucosa, and tongue normal  NECK: supple, thyroid normal size, non-tender, without nodularity LYMPH:  no palpable lymphadenopathy in the cervical, axillary or inguinal LUNGS: clear to auscultation and percussion with normal breathing effort HEART: regular rate & rhythm and no murmurs and no lower extremity edema ABDOMEN:abdomen soft, non-tender and normal bowel sounds Musculoskeletal:no cyanosis of digits and no clubbing  NEURO: alert & oriented x 3 with fluent speech, no focal motor/sensory deficits  LABORATORY DATA:  I have reviewed the data as listed    Component Value Date/Time   NA 140 08/28/2020 0816   NA 139 04/14/2017 0742   K 4.0 08/28/2020 0816   K 4.0 04/14/2017 0742   CL 104 08/28/2020 0816   CO2 23 08/28/2020 0816   CO2 21 (L) 04/14/2017 0742   GLUCOSE 136 (H) 08/28/2020 0816   GLUCOSE 247 (H) 04/14/2017 0742   BUN 14 08/28/2020 0816   BUN 13.1 04/14/2017 0742   CREATININE 1.13 (H) 08/28/2020 0816   CREATININE 0.97 04/17/2020 0812   CREATININE 0.9 04/14/2017 0742    CALCIUM 8.9 08/28/2020 0816   CALCIUM 9.2 04/14/2017 0742   PROT 6.6 08/28/2020 0816   PROT 7.3 04/14/2017 0742   ALBUMIN 3.7 08/28/2020 0816   ALBUMIN 3.9 04/14/2017 0742   AST 16 08/28/2020 0816   AST 18 04/17/2020 0812   AST 17 04/14/2017 0742   ALT 8 08/28/2020 0816   ALT 11 04/17/2020 0812   ALT 14 04/14/2017 0742   ALKPHOS 56 08/28/2020 0816   ALKPHOS 70 04/14/2017 0742   BILITOT 0.6 08/28/2020 0816   BILITOT 0.3 04/17/2020 0812   BILITOT 0.37 04/14/2017 0742   GFRNONAA 54 (L) 08/28/2020 0816   GFRNONAA >60 04/17/2020 0812   GFRAA >60 01/24/2020 0924   GFRAA >60 07/19/2019 0951    No results found for: SPEP, UPEP  Lab Results  Component Value Date   WBC 5.9 09/25/2020   NEUTROABS 3.1 09/25/2020   HGB 13.4 09/25/2020   HCT  41.1 09/25/2020   MCV 95.8 09/25/2020   PLT 171 09/25/2020      Chemistry      Component Value Date/Time   NA 140 08/28/2020 0816   NA 139 04/14/2017 0742   K 4.0 08/28/2020 0816   K 4.0 04/14/2017 0742   CL 104 08/28/2020 0816   CO2 23 08/28/2020 0816   CO2 21 (L) 04/14/2017 0742   BUN 14 08/28/2020 0816   BUN 13.1 04/14/2017 0742   CREATININE 1.13 (H) 08/28/2020 0816   CREATININE 0.97 04/17/2020 0812   CREATININE 0.9 04/14/2017 0742      Component Value Date/Time   CALCIUM 8.9 08/28/2020 0816   CALCIUM 9.2 04/14/2017 0742   ALKPHOS 56 08/28/2020 0816   ALKPHOS 70 04/14/2017 0742   AST 16 08/28/2020 0816   AST 18 04/17/2020 0812   AST 17 04/14/2017 0742   ALT 8 08/28/2020 0816   ALT 11 04/17/2020 0812   ALT 14 04/14/2017 0742   BILITOT 0.6 08/28/2020 0816   BILITOT 0.3 04/17/2020 0812   BILITOT 0.37 04/14/2017 1423

## 2020-09-25 NOTE — Patient Instructions (Signed)
[facility]  Discharge Instructions: °Thank you for choosing [facility] to provide your oncology and hematology care.  ° °If you have a lab appointment with the Cancer Center, please go directly to the Cancer Center and check in at the registration area. °  °Wear comfortable clothing and clothing appropriate for easy access to any Portacath or PICC line.  ° °We strive to give you quality time with your provider. You may need to reschedule your appointment if you arrive late (15 or more minutes).  Arriving late affects you and other patients whose appointments are after yours.  Also, if you miss three or more appointments without notifying the office, you may be dismissed from the clinic at the provider’s discretion.    °  °For prescription refill requests, have your pharmacy contact our office and allow 72 hours for refills to be completed.   ° °Today you received the following chemotherapy and/or immunotherapy agents: bevacizumab    °  °To help prevent nausea and vomiting after your treatment, we encourage you to take your nausea medication as directed. ° °BELOW ARE SYMPTOMS THAT SHOULD BE REPORTED IMMEDIATELY: °*FEVER GREATER THAN 100.4 F (38 °C) OR HIGHER °*CHILLS OR SWEATING °*NAUSEA AND VOMITING THAT IS NOT CONTROLLED WITH YOUR NAUSEA MEDICATION °*UNUSUAL SHORTNESS OF BREATH °*UNUSUAL BRUISING OR BLEEDING °*URINARY PROBLEMS (pain or burning when urinating, or frequent urination) °*BOWEL PROBLEMS (unusual diarrhea, constipation, pain near the anus) °TENDERNESS IN MOUTH AND THROAT WITH OR WITHOUT PRESENCE OF ULCERS (sore throat, sores in mouth, or a toothache) °UNUSUAL RASH, SWELLING OR PAIN  °UNUSUAL VAGINAL DISCHARGE OR ITCHING  ° °Items with * indicate a potential emergency and should be followed up as soon as possible or go to the Emergency Department if any problems should occur. ° °Please show the CHEMOTHERAPY ALERT CARD or IMMUNOTHERAPY ALERT CARD at check-in  to the Emergency Department and triage nurse. ° °Should you have questions after your visit or need to cancel or reschedule your appointment, please contact [facility]  Dept: [phone]  and follow the prompts.  Office hours are 8:00 a.m. to 4:30 p.m. Monday - Friday. Please note that voicemails left after 4:00 p.m. may not be returned until the following business day.  We are closed weekends and major holidays. You have access to a nurse at all times for urgent questions. Please call the main number to the clinic Dept: [phone] and follow the prompts. ° ° °For any non-urgent questions, you may also contact your provider using MyChart. We now offer e-Visits for anyone 18 and older to request care online for non-urgent symptoms. For details visit [email or web address]. °  °Also download the MyChart app! Go to the app store, search "MyChart", open the app, select [facility], and log in with your MyChart username and password. ° °Due to Covid, a mask is required upon entering the hospital/clinic. If you do not have a mask, one will be given to you upon arrival. For doctor visits, patients may have 1 support person aged 18 or older with them. For treatment visits, patients cannot have anyone with them due to current Covid guidelines and our immunocompromised population.  ° °

## 2020-09-25 NOTE — Assessment & Plan Note (Signed)
Her documented blood pressure over few weeks has been good  she denies headache or other symptoms such as blurriness of vision She will continue close monitoring of her blood pressure and she will take her current prescribed medications

## 2020-09-25 NOTE — Assessment & Plan Note (Signed)
Her blood pressure is better controlled with the addition of lisinopril She has no recent right upper quadrant pain She have no other symptoms We will proceed with treatment as scheduled I plan to repeat imaging study in September

## 2020-10-16 ENCOUNTER — Inpatient Hospital Stay: Payer: BC Managed Care – PPO

## 2020-10-16 ENCOUNTER — Other Ambulatory Visit: Payer: Self-pay

## 2020-10-16 VITALS — BP 133/66 | HR 77 | Temp 98.0°F | Resp 16 | Wt 165.5 lb

## 2020-10-16 DIAGNOSIS — Z5112 Encounter for antineoplastic immunotherapy: Secondary | ICD-10-CM | POA: Diagnosis not present

## 2020-10-16 DIAGNOSIS — C786 Secondary malignant neoplasm of retroperitoneum and peritoneum: Secondary | ICD-10-CM | POA: Diagnosis not present

## 2020-10-16 DIAGNOSIS — C5701 Malignant neoplasm of right fallopian tube: Secondary | ICD-10-CM | POA: Diagnosis not present

## 2020-10-16 DIAGNOSIS — Z79899 Other long term (current) drug therapy: Secondary | ICD-10-CM | POA: Diagnosis not present

## 2020-10-16 DIAGNOSIS — Z7189 Other specified counseling: Secondary | ICD-10-CM

## 2020-10-16 DIAGNOSIS — C562 Malignant neoplasm of left ovary: Secondary | ICD-10-CM

## 2020-10-16 DIAGNOSIS — C569 Malignant neoplasm of unspecified ovary: Secondary | ICD-10-CM

## 2020-10-16 LAB — CBC WITH DIFFERENTIAL/PLATELET
Abs Immature Granulocytes: 0.02 10*3/uL (ref 0.00–0.07)
Basophils Absolute: 0.1 10*3/uL (ref 0.0–0.1)
Basophils Relative: 1 %
Eosinophils Absolute: 0.6 10*3/uL — ABNORMAL HIGH (ref 0.0–0.5)
Eosinophils Relative: 10 %
HCT: 39.6 % (ref 36.0–46.0)
Hemoglobin: 12.9 g/dL (ref 12.0–15.0)
Immature Granulocytes: 0 %
Lymphocytes Relative: 27 %
Lymphs Abs: 1.5 10*3/uL (ref 0.7–4.0)
MCH: 30.9 pg (ref 26.0–34.0)
MCHC: 32.6 g/dL (ref 30.0–36.0)
MCV: 94.7 fL (ref 80.0–100.0)
Monocytes Absolute: 0.5 10*3/uL (ref 0.1–1.0)
Monocytes Relative: 9 %
Neutro Abs: 3 10*3/uL (ref 1.7–7.7)
Neutrophils Relative %: 53 %
Platelets: 194 10*3/uL (ref 150–400)
RBC: 4.18 MIL/uL (ref 3.87–5.11)
RDW: 13.3 % (ref 11.5–15.5)
WBC: 5.7 10*3/uL (ref 4.0–10.5)
nRBC: 0 % (ref 0.0–0.2)

## 2020-10-16 LAB — COMPREHENSIVE METABOLIC PANEL
ALT: 8 U/L (ref 0–44)
AST: 16 U/L (ref 15–41)
Albumin: 3.5 g/dL (ref 3.5–5.0)
Alkaline Phosphatase: 60 U/L (ref 38–126)
Anion gap: 11 (ref 5–15)
BUN: 18 mg/dL (ref 8–23)
CO2: 23 mmol/L (ref 22–32)
Calcium: 9.3 mg/dL (ref 8.9–10.3)
Chloride: 106 mmol/L (ref 98–111)
Creatinine, Ser: 1.18 mg/dL — ABNORMAL HIGH (ref 0.44–1.00)
GFR, Estimated: 51 mL/min — ABNORMAL LOW (ref >60)
Glucose, Bld: 128 mg/dL — ABNORMAL HIGH (ref 70–99)
Potassium: 4.1 mmol/L (ref 3.5–5.1)
Sodium: 140 mmol/L (ref 135–145)
Total Bilirubin: 0.4 mg/dL (ref 0.3–1.2)
Total Protein: 6.7 g/dL (ref 6.5–8.1)

## 2020-10-16 LAB — TOTAL PROTEIN, URINE DIPSTICK: Protein, ur: NEGATIVE mg/dL

## 2020-10-16 MED ORDER — HEPARIN SOD (PORK) LOCK FLUSH 100 UNIT/ML IV SOLN
500.0000 [IU] | Freq: Once | INTRAVENOUS | Status: AC | PRN
  Administered 2020-10-16: 500 [IU]
  Filled 2020-10-16: qty 5

## 2020-10-16 MED ORDER — SODIUM CHLORIDE 0.9% FLUSH
10.0000 mL | INTRAVENOUS | Status: DC | PRN
  Administered 2020-10-16: 10 mL
  Filled 2020-10-16: qty 10

## 2020-10-16 MED ORDER — SODIUM CHLORIDE 0.9 % IV SOLN
10.0000 mg/kg | Freq: Once | INTRAVENOUS | Status: AC
  Administered 2020-10-16: 800 mg via INTRAVENOUS
  Filled 2020-10-16: qty 32

## 2020-10-16 MED ORDER — SODIUM CHLORIDE 0.9% FLUSH
10.0000 mL | Freq: Once | INTRAVENOUS | Status: AC
  Administered 2020-10-16: 10 mL
  Filled 2020-10-16: qty 10

## 2020-10-16 MED ORDER — SODIUM CHLORIDE 0.9 % IV SOLN
Freq: Once | INTRAVENOUS | Status: AC
  Filled 2020-10-16: qty 250

## 2020-10-16 NOTE — Patient Instructions (Signed)
[facility]  Discharge Instructions: °Thank you for choosing [facility] to provide your oncology and hematology care.  ° °If you have a lab appointment with the Cancer Center, please go directly to the Cancer Center and check in at the registration area. °  °Wear comfortable clothing and clothing appropriate for easy access to any Portacath or PICC line.  ° °We strive to give you quality time with your provider. You may need to reschedule your appointment if you arrive late (15 or more minutes).  Arriving late affects you and other patients whose appointments are after yours.  Also, if you miss three or more appointments without notifying the office, you may be dismissed from the clinic at the provider’s discretion.    °  °For prescription refill requests, have your pharmacy contact our office and allow 72 hours for refills to be completed.   ° °Today you received the following chemotherapy and/or immunotherapy agents: bevacizumab    °  °To help prevent nausea and vomiting after your treatment, we encourage you to take your nausea medication as directed. ° °BELOW ARE SYMPTOMS THAT SHOULD BE REPORTED IMMEDIATELY: °*FEVER GREATER THAN 100.4 F (38 °C) OR HIGHER °*CHILLS OR SWEATING °*NAUSEA AND VOMITING THAT IS NOT CONTROLLED WITH YOUR NAUSEA MEDICATION °*UNUSUAL SHORTNESS OF BREATH °*UNUSUAL BRUISING OR BLEEDING °*URINARY PROBLEMS (pain or burning when urinating, or frequent urination) °*BOWEL PROBLEMS (unusual diarrhea, constipation, pain near the anus) °TENDERNESS IN MOUTH AND THROAT WITH OR WITHOUT PRESENCE OF ULCERS (sore throat, sores in mouth, or a toothache) °UNUSUAL RASH, SWELLING OR PAIN  °UNUSUAL VAGINAL DISCHARGE OR ITCHING  ° °Items with * indicate a potential emergency and should be followed up as soon as possible or go to the Emergency Department if any problems should occur. ° °Please show the CHEMOTHERAPY ALERT CARD or IMMUNOTHERAPY ALERT CARD at check-in  to the Emergency Department and triage nurse. ° °Should you have questions after your visit or need to cancel or reschedule your appointment, please contact [facility]  Dept: [phone]  and follow the prompts.  Office hours are 8:00 a.m. to 4:30 p.m. Monday - Friday. Please note that voicemails left after 4:00 p.m. may not be returned until the following business day.  We are closed weekends and major holidays. You have access to a nurse at all times for urgent questions. Please call the main number to the clinic Dept: [phone] and follow the prompts. ° ° °For any non-urgent questions, you may also contact your provider using MyChart. We now offer e-Visits for anyone 18 and older to request care online for non-urgent symptoms. For details visit [email or web address]. °  °Also download the MyChart app! Go to the app store, search "MyChart", open the app, select [facility], and log in with your MyChart username and password. ° °Due to Covid, a mask is required upon entering the hospital/clinic. If you do not have a mask, one will be given to you upon arrival. For doctor visits, patients may have 1 support person aged 18 or older with them. For treatment visits, patients cannot have anyone with them due to current Covid guidelines and our immunocompromised population.  ° °

## 2020-10-16 NOTE — Patient Instructions (Signed)

## 2020-10-20 ENCOUNTER — Encounter: Payer: Self-pay | Admitting: Hematology and Oncology

## 2020-10-31 ENCOUNTER — Encounter: Payer: Self-pay | Admitting: Hematology and Oncology

## 2020-11-06 ENCOUNTER — Encounter: Payer: Self-pay | Admitting: Hematology and Oncology

## 2020-11-06 ENCOUNTER — Ambulatory Visit: Payer: Self-pay

## 2020-11-06 ENCOUNTER — Other Ambulatory Visit: Payer: Self-pay

## 2020-11-06 ENCOUNTER — Inpatient Hospital Stay (HOSPITAL_BASED_OUTPATIENT_CLINIC_OR_DEPARTMENT_OTHER): Payer: Medicare Other | Admitting: Hematology and Oncology

## 2020-11-06 ENCOUNTER — Inpatient Hospital Stay: Payer: Medicare Other | Attending: Hematology and Oncology

## 2020-11-06 ENCOUNTER — Inpatient Hospital Stay: Payer: Medicare Other

## 2020-11-06 VITALS — BP 121/58 | HR 80 | Temp 97.6°F | Resp 18 | Ht 65.0 in | Wt 164.4 lb

## 2020-11-06 VITALS — BP 115/73 | HR 67

## 2020-11-06 DIAGNOSIS — C562 Malignant neoplasm of left ovary: Secondary | ICD-10-CM

## 2020-11-06 DIAGNOSIS — C563 Malignant neoplasm of bilateral ovaries: Secondary | ICD-10-CM | POA: Insufficient documentation

## 2020-11-06 DIAGNOSIS — C569 Malignant neoplasm of unspecified ovary: Secondary | ICD-10-CM

## 2020-11-06 DIAGNOSIS — Z7189 Other specified counseling: Secondary | ICD-10-CM

## 2020-11-06 DIAGNOSIS — Z5112 Encounter for antineoplastic immunotherapy: Secondary | ICD-10-CM | POA: Insufficient documentation

## 2020-11-06 DIAGNOSIS — C5701 Malignant neoplasm of right fallopian tube: Secondary | ICD-10-CM | POA: Diagnosis not present

## 2020-11-06 DIAGNOSIS — R7989 Other specified abnormal findings of blood chemistry: Secondary | ICD-10-CM | POA: Diagnosis not present

## 2020-11-06 DIAGNOSIS — Z79899 Other long term (current) drug therapy: Secondary | ICD-10-CM | POA: Diagnosis not present

## 2020-11-06 DIAGNOSIS — I10 Essential (primary) hypertension: Secondary | ICD-10-CM

## 2020-11-06 LAB — CBC WITH DIFFERENTIAL/PLATELET
Abs Immature Granulocytes: 0.03 10*3/uL (ref 0.00–0.07)
Basophils Absolute: 0.1 10*3/uL (ref 0.0–0.1)
Basophils Relative: 1 %
Eosinophils Absolute: 0.6 10*3/uL — ABNORMAL HIGH (ref 0.0–0.5)
Eosinophils Relative: 10 %
HCT: 39.2 % (ref 36.0–46.0)
Hemoglobin: 13.3 g/dL (ref 12.0–15.0)
Immature Granulocytes: 1 %
Lymphocytes Relative: 25 %
Lymphs Abs: 1.4 10*3/uL (ref 0.7–4.0)
MCH: 31.7 pg (ref 26.0–34.0)
MCHC: 33.9 g/dL (ref 30.0–36.0)
MCV: 93.3 fL (ref 80.0–100.0)
Monocytes Absolute: 0.6 10*3/uL (ref 0.1–1.0)
Monocytes Relative: 10 %
Neutro Abs: 3.1 10*3/uL (ref 1.7–7.7)
Neutrophils Relative %: 53 %
Platelets: 196 10*3/uL (ref 150–400)
RBC: 4.2 MIL/uL (ref 3.87–5.11)
RDW: 13.4 % (ref 11.5–15.5)
WBC: 5.7 10*3/uL (ref 4.0–10.5)
nRBC: 0 % (ref 0.0–0.2)

## 2020-11-06 LAB — COMPREHENSIVE METABOLIC PANEL
ALT: 10 U/L (ref 0–44)
AST: 16 U/L (ref 15–41)
Albumin: 3.6 g/dL (ref 3.5–5.0)
Alkaline Phosphatase: 62 U/L (ref 38–126)
Anion gap: 11 (ref 5–15)
BUN: 17 mg/dL (ref 8–23)
CO2: 23 mmol/L (ref 22–32)
Calcium: 9.4 mg/dL (ref 8.9–10.3)
Chloride: 105 mmol/L (ref 98–111)
Creatinine, Ser: 1.44 mg/dL — ABNORMAL HIGH (ref 0.44–1.00)
GFR, Estimated: 40 mL/min — ABNORMAL LOW (ref >60)
Glucose, Bld: 137 mg/dL — ABNORMAL HIGH (ref 70–99)
Potassium: 4.7 mmol/L (ref 3.5–5.1)
Sodium: 139 mmol/L (ref 135–145)
Total Bilirubin: 0.5 mg/dL (ref 0.3–1.2)
Total Protein: 6.8 g/dL (ref 6.5–8.1)

## 2020-11-06 MED ORDER — SODIUM CHLORIDE 0.9% FLUSH
10.0000 mL | Freq: Once | INTRAVENOUS | Status: AC
  Administered 2020-11-06: 10 mL
  Filled 2020-11-06: qty 10

## 2020-11-06 MED ORDER — SODIUM CHLORIDE 0.9 % IV SOLN
Freq: Once | INTRAVENOUS | Status: AC
  Filled 2020-11-06: qty 250

## 2020-11-06 MED ORDER — SODIUM CHLORIDE 0.9 % IV SOLN
10.0000 mg/kg | Freq: Once | INTRAVENOUS | Status: AC
  Administered 2020-11-06: 800 mg via INTRAVENOUS
  Filled 2020-11-06: qty 32

## 2020-11-06 MED ORDER — HEPARIN SOD (PORK) LOCK FLUSH 100 UNIT/ML IV SOLN
500.0000 [IU] | Freq: Once | INTRAVENOUS | Status: AC | PRN
  Administered 2020-11-06: 500 [IU]
  Filled 2020-11-06: qty 5

## 2020-11-06 MED ORDER — SODIUM CHLORIDE 0.9% FLUSH
10.0000 mL | INTRAVENOUS | Status: DC | PRN
  Administered 2020-11-06: 10 mL
  Filled 2020-11-06: qty 10

## 2020-11-06 NOTE — Assessment & Plan Note (Signed)
She has intermittent elevated serum creatinine Observe closely for now I recommend increase fluid hydration as tolerated

## 2020-11-06 NOTE — Progress Notes (Signed)
[place] OFFICE PROGRESS NOTE  Patient Care Team: Gaynelle Arabian, MD as PCP - General (Family Medicine) Heath Lark, MD as Consulting Physician (Hematology and Oncology) Everitt Amber, MD as Consulting Physician (Obstetrics and Gynecology)  ASSESSMENT & PLAN:  Adenocarcinoma of right fallopian tube [facility]) Her blood pressure is better controlled with the addition of lisinopril She has no other symptoms We will proceed with treatment as scheduled I plan to repeat imaging study end of next month  Essential hypertension Her documented blood pressure over few weeks has been good  she denies headache or other symptoms such as blurriness of vision She will continue close monitoring of her blood pressure and she will take her current prescribed medications  Elevated serum creatinine She has intermittent elevated serum creatinine Observe closely for now I recommend increase fluid hydration as tolerated  Orders Placed This Encounter  Procedures   CT ABDOMEN PELVIS W CONTRAST    Standing Status:   Future    Standing Expiration Date:   11/06/2021    Order Specific Question:   If indicated for the ordered procedure, I authorize the administration of contrast media per Radiology protocol    Answer:   Yes    Order Specific Question:   Preferred imaging location?    Answer:   [facility]    Order Specific Question:   Radiology Contrast Protocol - do NOT remove file path    Answer:   [email or web address]   Comprehensive metabolic panel    Standing Status:   Standing    Number of Occurrences:   33    Standing Expiration Date:   11/06/2021   Total Protein, Urine dipstick    Standing Status:   Standing    Number of Occurrences:   9    Standing Expiration Date:   11/06/2021   CBC with Differential/Platelet    Standing Status:   Standing    Number of Occurrences:   22    Standing Expiration Date:   11/06/2021    All questions were  answered. The patient knows to call the clinic with any problems, questions or concerns. The total time spent in the appointment was 20 minutes encounter with patients including review of chart and various tests results, discussions about plan of care and coordination of care plan   Heath Lark, MD 11/06/2020 1:40 PM  INTERVAL HISTORY: Please see below for problem oriented charting. She returns with her husband for further follow-up Her documented blood pressure from home is satisfactory She had no abdominal pain, bloating or changes in bowel habits  SUMMARY OF ONCOLOGIC HISTORY: Oncology History Overview Note  Neg genetics    Adenocarcinoma of right fallopian tube ([place])  08/12/2016 Initial Diagnosis   The patient has many months of vague symptoms of fullness in the pelvis, urinary frequency and difficulty with defecation. She denies vaginal bleeding or rectal bleeding.      08/12/2016 Imaging   Abdomen X-ray in the ER: Moderate colonic stool burden without evidence of enteric obstruction    11/13/2016 Imaging   TVUS was performed on 11/13/16 which showed a large hypoechoic lobular mass seen posterior to the uterus measuring 18.2x16.1x11.8cm, blood flow was seen along the periphery of the mass. It was considered to be likely to be a fibroid vs pelvic mass vs ovarian mass. Neither ovary was removed. Moderate amount of free fluid in the pelvis. THe uterus measures 4x10x4cm. The endometrium is 61m.    12/05/2016 Imaging   MR pelvis 1.  Mild limitations as detailed above. 2. Heterogeneous pelvic mass is favored to arise from the right ovary. Favor a solid ovarian neoplasm such as fibroma/Brenner's tumor. Given lesion size and heterogeneous T2 signal out, complicating torsion cannot be excluded. 3. Moderate abdominopelvic ascites, without specific evidence of peritoneal metastasis. Consider further evaluation with contrast-enhanced abdominopelvic CT.     12/26/2016 Pathology Results   1.  Uterus, ovaries and fallopian tubes - ADENOCARCINOMA INVOLVING RIGHT FALLOPIAN TUBE, RIGHT AND LEFT OVARIES, UTERINE SEROSA AND PELVIC MASS. - SEE ONCOLOGY TABLE AND COMMENT. - UTERINE CERVIX, ENDOMETRIUM, MYOMETRIUM AND LEFT FALLOPIAN TUBE FREE OF TUMOR. 2. Omentum, resection for tumor - ADENOCARCINOMA. Microscopic Comment 1. ONCOLOGY TABLE - FALLOPIAN TUBE. SEE COMMENT 1. Specimen, including laterality: Uterus, bilateral adnexa, pelvic mass and omentum. 2. Procedure: Hysterectomy with bilateral salpingo-oophorectomy, pelvic mass excision and omentectomy. 3. Lymph node sampling performed: No 4. Tumor site: See comment. 5. Tumor location in fallopian tube: Right fallopian tube fimbria 6. Specimen integrity (intact/ruptured/disrupted): Intact 7. Tumor size (cm): 18 cm, see comment. 8. Histologic type: Adenocarcinoma 9. Grade: High grade 10. Microscopic tumor extension: Tumor involves right fallopian tube, right and left ovaries, uterine serosa, pelvic mass and omentum. 11. Margins: See comment. 12. Lymph-Vascular invasion: Present 13. Lymph nodes: # examined: 0; # positive: N/A 14. TNM: pT3c, pNX 15. FIGO Stage (based on pathologic findings, needs clinical correlation: III-C 16. Comments: There is an 18 cm pelvic mass which is a high grade adenocarcinoma and there is a 12.2 cm  segment of omentum which is extensively involved with adenocarcinoma. The tumor also involves the fimbria of the right fallopian tube and the parenchyma of the right and left ovaries as well as uterine serosa. The fimbria of the right fallopian has intraepithelial atypia consistent with a precursor lesion and therefore this is most consistent with primary fallopian tube adenocarcinoma. A primary peritoneal serous carcinoma is a less likely possibility.     12/26/2016 Pathology Results   PERITONEAL/ASCITIC FLUID(SPECIMEN 1 OF 1 COLLECTED 12/26/16): POORLY DIFFERENTIATED ADENOCARCINOMA    12/26/2016 Surgery    Procedure(s) Performed: Exploratory laparotomy with total abdominal hysterectomy, bilateral salpingo-oophorectomy, omentectomy radical tumor debulking for ovarian cancer .   Surgeon: Thereasa Solo, MD.      Operative Findings: 20cm left ovarian mass densely adherent to the posterior uterus, right tube and ovary, cervix, sigmoid colon. 10cm omental cake. 4L ascites. 7m size tumor nodules on the serosa of the terminal ileum and proximal sigmoid colon. 1587mnodules on right diaphragm.    This represented an optimal cytoreduction (R1) with 87m17modules on intestine and diaphragm representing gross visible disease    01/16/2017 Procedure   Placement of single lumen port a cath via right internal jugular vein. The catheter tip lies at the cavo-atrial junction. A power injectable port a cath was placed and is ready for immediate use.    01/17/2017 Imaging   1. 3.5 x 7.2 x 4.6 cm fluid collection along the vaginal cuff, posterior the bladder and extending into the right adnexal space. This lesion demonstrates rim enhancement. Imaging features could be related to a loculated postoperative seroma or hematoma. Superinfection cannot be excluded by CT. Given debulking surgery was 3 weeks ago, this entire structure is un likely to represent neoplasm, but peritoneal involvement could have this appearance. 2. Small fluid collection in the left para colic gutter without rim enhancement. 3. Irregular/nodular appearance of the peritoneal M in the anatomic pelvis with areas of subtle nodularity between the stomach and the  spleen. Close attention in these regions on follow-up recommended as metastatic disease is a concern. 4. Mildly enlarged hepatoduodenal ligament lymph node. Attention on follow-up recommended.    01/20/2017 Tumor Marker   Patient's tumor was tested for the following markers: CA-125 Results of the tumor marker test revealed 46.6    01/28/2017 Genetic Testing       Negative genetic testing on the  [facility] panel.  The [facility] gene panel offered by [employer] includes sequencing and deletion/duplication testing of the following 28 genes: APC, ATM, BARD1, BMPR1A, BRCA1, BRCA2, BRIP1, CHD1, CDK4, CDKN2A, CHEK2, EPCAM (large rearrangement only), MLH1, MSH2, MSH6, MUTYH, NBN, PALB2, PMS2, PTEN, RAD51C, RAD51D, SMAD4, STK11, and TP53. Sequencing was performed for select regions of POLE and POLD1, and large rearrangement analysis was performed for select regions of GREM1. The report date is January 28, 2017.  HRD testing looking for genomic instability and BRCA mutations was negative.  The report date of this test is January 27, 2017.     01/31/2017 Tumor Marker   Patient's tumor was tested for the following markers: CA-125 Results of the tumor marker test revealed 22.4    03/04/2017 Tumor Marker   Patient's tumor was tested for the following markers: CA-125 Results of the tumor marker test revealed 13.8    04/18/2017 Tumor Marker   Patient's tumor was tested for the following markers: CA-125 Results of the tumor marker test revealed 11.9    05/05/2017 Tumor Marker   Patient's tumor was tested for the following markers: CA-125 Results of the tumor marker test revealed 12    05/26/2017 Tumor Marker   Patient's tumor was tested for the following markers: CA-125 Results of the tumor marker test revealed 10.6    05/26/2017 Imaging   1. A previously noted postoperative fluid collection in the low pelvis and residual ascites seen on the prior examination has resolved on today's study. No findings to suggest residual/recurrent disease on today's examination. No definite solid organ metastasis identified in the abdomen or pelvis. No lymphadenopathy. 2. Aortic atherosclerosis.    06/10/2017 Procedure   Successful right IJ vein Port-A-Cath explant.    03/09/2018 Tumor Marker   Patient's tumor was tested for the following markers: CA-125 Results of the tumor marker test  revealed 12.1   05/26/2018 Tumor Marker   Patient's tumor was tested for the following markers: CA-125 Results of the tumor marker test revealed 13.8   09/09/2018 Tumor Marker   Patient's tumor was tested for the following markers: CA-125 Results of the tumor marker test revealed 23.9   12/18/2018 Tumor Marker   Patient's tumor was tested for the following markers: CA-125 Results of the tumor marker test revealed 43.8   02/27/2019 - 02/28/2019 Hospital Admission   She was admitted for bowel obstruction   02/27/2019 Imaging   1. High-grade small bowel obstruction with transition point in the pelvis in an area of suspected desmoplastic reaction surrounding a 1.3 cm spiculated nodule in the mesentery, suspicious for carcinoid. There is hyperenhancement near the tip of the appendix and loss of the normal fat plane between it and the adjacent sigmoid colon, potentially the site of primary lesion. 2. Multiple new small subcentimeter hyperdense lesions scattered along the liver capsule, concerning for metastases. Enlarged hyperenhancing gastrohepatic and portacaval lymph nodes concerning for nodal metastases. 3. Very mild right hydroureteronephrosis to the level of the desmoplastic reaction in the pelvis, potentially involving the right ureter. 4. Infraumbilical ventral abdominal wall diastasis containing a small  nondilated portion of transverse colon. 5. Trace ascites. 6. Cholelithiasis. 7.  Aortic atherosclerosis (ICD10-I70.0).     03/05/2019 Tumor Marker   Patient's tumor was tested for the following markers: CA-125 Results of the tumor marker test revealed 70.1   03/12/2019 Echocardiogram   IMPRESSIONS     1. Left ventricular ejection fraction, by visual estimation, is 60 to 65%. The left ventricle has normal function. There is no left ventricular hypertrophy.  2. Left ventricular diastolic parameters are consistent with Grade I diastolic dysfunction (impaired relaxation).  3. Global right  ventricle has normal systolic function.The right ventricular size is normal. No increase in right ventricular wall thickness.  4. Left atrial size was normal.  5. Right atrial size was normal.  6. The pericardium was not assessed.  7. The mitral valve is normal in structure. No evidence of mitral valve regurgitation.  8. The tricuspid valve is normal in structure. Tricuspid valve regurgitation is trivial.  9. The aortic valve is normal in structure. Aortic valve regurgitation is not visualized. 10. The pulmonic valve was grossly normal. Pulmonic valve regurgitation is not visualized. 11. The average left ventricular global longitudinal strain is -17.6 %.   03/16/2019 Procedure   Successful placement of a right internal jugular approach power injectable Port-A-Cath. The catheter is ready for immediate use.     04/19/2019 Tumor Marker   Patient's tumor was tested for the following markers: CA-125 Results of the tumor marker test revealed 39.8   05/17/2019 Tumor Marker   Patient's tumor was tested for the following markers: CA-125 Results of the tumor marker test revealed 27   05/28/2019 Tumor Marker   Patient's tumor was tested for the following markers: CA-125 Results of the tumor marker test revealed 27.7.   06/11/2019 Imaging   1. No new or progressive metastatic disease in the abdomen or pelvis. 2. Tiny noncalcified perisplenic implant is decreased. Calcified pericardiophrenic, retroperitoneal and bilateral inguinal lymph nodes and scattered small calcified perihepatic and perisplenic implants are stable. 3.  Aortic Atherosclerosis (ICD10-I70.0).       06/17/2019 Echocardiogram    1. Left ventricular ejection fraction, by estimation, is 65 to 70%. The left ventricle has normal function. The left ventricle has no regional wall motion abnormalities. Left ventricular diastolic parameters were normal.  2. Right ventricular systolic function is normal. The right ventricular size is  normal.  3. The mitral valve is normal in structure and function. No evidence of mitral valve regurgitation. No evidence of mitral stenosis.  4. The aortic valve is normal in structure and function. Aortic valve regurgitation is not visualized. No aortic stenosis is present.   06/21/2019 Tumor Marker   Patient's tumor was tested for the following markers: CA-125 Results of the tumor marker test revealed 18.7   07/19/2019 Tumor Marker   Patient's tumor was tested for the following markers: CA-125 Results of the tumor marker test revealed 16.7   08/30/2019 Tumor Marker   Patient's tumor was tested for the following markers: CA-125. Results of the tumor marker test revealed 13.9   09/15/2019 Echocardiogram    1. Left ventricular ejection fraction, by estimation, is 65 to 70%. The left ventricle has normal function. The left ventricle has no regional wall motion abnormalities. Left ventricular diastolic parameters are consistent with Grade I diastolic dysfunction (impaired relaxation). The average left ventricular global longitudinal strain is 18.1 %. The global longitudinal strain is normal.  2. Right ventricular systolic function is normal. The right ventricular size is normal.  3.  The mitral valve is normal in structure. No evidence of mitral valve regurgitation. No evidence of mitral stenosis.  4. The aortic valve is normal in structure. Aortic valve regurgitation is not visualized. No aortic stenosis is present.  5. The inferior vena cava is normal in size with greater than 50% respiratory variability, suggesting right atrial pressure of 3 mmHg.     09/24/2019 Imaging   1. Stable exam. No new or progressive metastatic disease on today's study. 2. Small subcapsular hypodensity in the lateral spleen, new in the interval, but indeterminate. Attention on follow-up recommended. 3. The calcified upper abdominal and groin lymph nodes are stable as are the scattered calcified and noncalcified peritoneal  implants. 4. Stable midline ventral hernia containing a short segment of colon without complicating features. 5. Aortic Atherosclerosis (ICD10-I70.0).     10/04/2019 -  Chemotherapy    Patient is on Treatment Plan: OVARIAN BEVACIZUMAB Q21D       10/04/2019 Tumor Marker   Patient's tumor was tested for the following markers: CA-125. Results of the tumor marker test revealed 12.9.   11/01/2019 Tumor Marker   Patient's tumor was tested for the following markers: CA-125 Results of the tumor marker test revealed 15.4   12/13/2019 Tumor Marker   Patient's tumor was tested for the following markers: CA-125 Results of the tumor marker test revealed 14.8   12/31/2019 Imaging   1. Unchanged tiny peritoneal nodule adjacent to the spleen measuring 6 mm. Other previously noted peritoneal nodules are imperceptible on present examination. 2. Unchanged prominent, partially calcified portacaval lymph node and bilateral inguinal lymph nodes. 3. Unchanged subtle, nonspecific subcapsular lesion of the spleen.  4. No evidence of new metastatic disease in the abdomen or pelvis. 5. Status post hysterectomy. 6. Unchanged low midline ventral abdominal hernia containing a single loop of nonobstructed transverse colon. 7. Cholelithiasis. 8. Aortic Atherosclerosis (ICD10-I70.0).       01/03/2020 Tumor Marker   Patient's tumor was tested for the following markers: CA-125 Results of the tumor marker test revealed 11.7.   01/24/2020 Tumor Marker   Patient's tumor was tested for the following markers: CA-125. Results of the tumor marker test revealed 15.1   03/06/2020 Tumor Marker   Patient's tumor was tested for the following markers: CA-125. Results of the tumor marker test revealed 20.3   03/27/2020 Tumor Marker   Patient's tumor was tested for the following markers: CA-125 Results of the tumor marker test revealed 23.7   05/11/2020 Tumor Marker   Patient's tumor was tested for the following markers:  CA-125. Results of the tumor marker test revealed 25.4   06/23/2020 Imaging   1. Cholelithiasis with gallbladder wall thickening and mild infiltrative edema in the porta hepatis, raising the possibility of acute cholecystitis. There is truncation of the common bile duct distally and distal choledocholithiasis is difficult to exclude. Correlate with bilirubin levels. If clinically warranted, MRCP could be utilized for further characterization. 2. Stable tiny perisplenic nodule. 3. Other imaging findings of potential clinical significance: Small type 1 hiatal hernia. Prominent stool throughout the colon favors constipation. Ventral infraumbilical hernia contains transverse colon without findings of strangulation or obstruction. Small supraumbilical hernia contains adipose tissue. Lumbar degenerative disc disease at L5-S1. Stable tiny nodule along the lateral margin of the spleen, nonspecific. 4. Aortic atherosclerosis.       REVIEW OF SYSTEMS:   Constitutional: Denies fevers, chills or abnormal weight loss Eyes: Denies blurriness of vision Ears, nose, mouth, throat, and face: Denies mucositis or sore throat  Respiratory: Denies cough, dyspnea or wheezes Cardiovascular: Denies palpitation, chest discomfort or lower extremity swelling Gastrointestinal:  Denies nausea, heartburn or change in bowel habits Skin: Denies abnormal skin rashes Lymphatics: Denies new lymphadenopathy or easy bruising Neurological:Denies numbness, tingling or new weaknesses Behavioral/Psych: Mood is stable, no new changes  All other systems were reviewed with the patient and are negative.  I have reviewed the past medical history, past surgical history, social history and family history with the patient and they are unchanged from previous note.  ALLERGIES:  is allergic to dilaudid [hydromorphone hcl], morphine and related, and sudafed [pseudoephedrine hcl].  MEDICATIONS:  Current Outpatient Medications  Medication Sig  Dispense Refill   acetaminophen (TYLENOL) 325 MG tablet Take 975 mg by mouth every 6 (six) hours as needed (pain).     amLODipine (NORVASC) 10 MG tablet TAKE 1 TABLET(10 MG) BY MOUTH DAILY 30 tablet 9   lidocaine-prilocaine (EMLA) cream Apply to affected area once daily as directed. 30 g 3   lisinopril (ZESTRIL) 20 MG tablet Take 1 tablet (20 mg total) by mouth daily. 30 tablet 3   ondansetron (ZOFRAN) 8 MG tablet Take 1 tablet (8 mg total) by mouth every 8 (eight) hours as needed. 30 tablet 1   prochlorperazine (COMPAZINE) 10 MG tablet TAKE 1 TABLET BY MOUTH EVERY 6 HOURS AS NEEDED FOR NAUSEA AND/OR VOMITING 30 tablet 1   traMADol (ULTRAM) 50 MG tablet Take 1 tablet (50 mg total) by mouth every 6 (six) hours as needed. 30 tablet 0   venlafaxine XR (EFFEXOR-XR) 37.5 MG 24 hr capsule TAKE 1 CAPSULE BY MOUTH DAILY WITH BREAKFAST 90 capsule 3   No current facility-administered medications for this visit.   Facility-Administered Medications Ordered in Other Visits  Medication Dose Route Frequency Provider Last Rate Last Admin   bevacizumab-bvzr (ZIRABEV) 800 mg in sodium chloride 0.9 % 100 mL chemo infusion  10 mg/kg (Treatment Plan Recorded) Intravenous Once Heath Lark, MD 396 mL/hr at 11/06/20 1338 800 mg at 11/06/20 1338   heparin lock flush 100 unit/mL  500 Units Intracatheter Once PRN Alvy Bimler, Sritha Chauncey, MD       sodium chloride flush (NS) 0.9 % injection 10 mL  10 mL Intravenous PRN Alvy Bimler, Cecila Satcher, MD   10 mL at 02/11/17 0839   sodium chloride flush (NS) 0.9 % injection 10 mL  10 mL Intravenous PRN Alvy Bimler, Audrionna Lampton, MD   10 mL at 03/04/17 1510   sodium chloride flush (NS) 0.9 % injection 10 mL  10 mL Intracatheter PRN Heath Lark, MD        PHYSICAL EXAMINATION: ECOG PERFORMANCE STATUS: 1 - Symptomatic but completely ambulatory  Vitals:   11/06/20 1157  BP: (!) 121/58  Pulse: 80  Resp: 18  Temp: 97.6 F (36.4 C)  SpO2: 100%   Filed Weights   11/06/20 1157  Weight: 164 lb 6.4 oz (74.6 kg)     GENERAL:alert, no distress and comfortable SKIN: skin color, texture, turgor are normal, no rashes or significant lesions EYES: normal, Conjunctiva are pink and non-injected, sclera clear OROPHARYNX:no exudate, no erythema and lips, buccal mucosa, and tongue normal  NECK: supple, thyroid normal size, non-tender, without nodularity LYMPH:  no palpable lymphadenopathy in the cervical, axillary or inguinal LUNGS: clear to auscultation and percussion with normal breathing effort HEART: regular rate & rhythm and no murmurs and no lower extremity edema ABDOMEN:abdomen soft, non-tender and normal bowel sounds Musculoskeletal:no cyanosis of digits and no clubbing  NEURO: alert & oriented x 3  with fluent speech, no focal motor/sensory deficits  LABORATORY DATA:  I have reviewed the data as listed    Component Value Date/Time   NA 139 11/06/2020 1130   NA 139 04/14/2017 0742   K 4.7 11/06/2020 1130   K 4.0 04/14/2017 0742   CL 105 11/06/2020 1130   CO2 23 11/06/2020 1130   CO2 21 (L) 04/14/2017 0742   GLUCOSE 137 (H) 11/06/2020 1130   GLUCOSE 247 (H) 04/14/2017 0742   BUN 17 11/06/2020 1130   BUN 13.1 04/14/2017 0742   CREATININE 1.44 (H) 11/06/2020 1130   CREATININE 0.97 04/17/2020 0812   CREATININE 0.9 04/14/2017 0742   CALCIUM 9.4 11/06/2020 1130   CALCIUM 9.2 04/14/2017 0742   PROT 6.8 11/06/2020 1130   PROT 7.3 04/14/2017 0742   ALBUMIN 3.6 11/06/2020 1130   ALBUMIN 3.9 04/14/2017 0742   AST 16 11/06/2020 1130   AST 18 04/17/2020 0812   AST 17 04/14/2017 0742   ALT 10 11/06/2020 1130   ALT 11 04/17/2020 0812   ALT 14 04/14/2017 0742   ALKPHOS 62 11/06/2020 1130   ALKPHOS 70 04/14/2017 0742   BILITOT 0.5 11/06/2020 1130   BILITOT 0.3 04/17/2020 0812   BILITOT 0.37 04/14/2017 0742   GFRNONAA 40 (L) 11/06/2020 1130   GFRNONAA >60 04/17/2020 0812   GFRAA >60 01/24/2020 0924   GFRAA >60 07/19/2019 0951    No results found for: SPEP, UPEP  Lab Results  Component  Value Date   WBC 5.7 11/06/2020   NEUTROABS 3.1 11/06/2020   HGB 13.3 11/06/2020   HCT 39.2 11/06/2020   MCV 93.3 11/06/2020   PLT 196 11/06/2020      Chemistry      Component Value Date/Time   NA 139 11/06/2020 1130   NA 139 04/14/2017 0742   K 4.7 11/06/2020 1130   K 4.0 04/14/2017 0742   CL 105 11/06/2020 1130   CO2 23 11/06/2020 1130   CO2 21 (L) 04/14/2017 0742   BUN 17 11/06/2020 1130   BUN 13.1 04/14/2017 0742   CREATININE 1.44 (H) 11/06/2020 1130   CREATININE 0.97 04/17/2020 0812   CREATININE 0.9 04/14/2017 0742      Component Value Date/Time   CALCIUM 9.4 11/06/2020 1130   CALCIUM 9.2 04/14/2017 0742   ALKPHOS 62 11/06/2020 1130   ALKPHOS 70 04/14/2017 0742   AST 16 11/06/2020 1130   AST 18 04/17/2020 0812   AST 17 04/14/2017 0742   ALT 10 11/06/2020 1130   ALT 11 04/17/2020 0812   ALT 14 04/14/2017 0742   BILITOT 0.5 11/06/2020 1130   BILITOT 0.3 04/17/2020 0812   BILITOT 0.37 04/14/2017 5537

## 2020-11-06 NOTE — Assessment & Plan Note (Signed)
Her documented blood pressure over few weeks has been good  she denies headache or other symptoms such as blurriness of vision She will continue close monitoring of her blood pressure and she will take her current prescribed medications

## 2020-11-06 NOTE — Assessment & Plan Note (Signed)
Her blood pressure is better controlled with the addition of lisinopril She has no other symptoms We will proceed with treatment as scheduled I plan to repeat imaging study end of next month

## 2020-11-21 ENCOUNTER — Encounter: Payer: Self-pay | Admitting: Hematology and Oncology

## 2020-11-22 ENCOUNTER — Other Ambulatory Visit: Payer: Self-pay | Admitting: Hematology and Oncology

## 2020-11-22 MED ORDER — LISINOPRIL 20 MG PO TABS: 20.0000 mg | ORAL_TABLET | Freq: Every day | ORAL | 3 refills | Status: DC

## 2020-11-27 ENCOUNTER — Other Ambulatory Visit: Payer: Self-pay

## 2020-11-27 ENCOUNTER — Inpatient Hospital Stay: Payer: Medicare Other | Attending: Hematology and Oncology

## 2020-11-27 ENCOUNTER — Encounter: Payer: Self-pay | Admitting: Hematology and Oncology

## 2020-11-27 ENCOUNTER — Inpatient Hospital Stay: Payer: Medicare Other

## 2020-11-27 VITALS — BP 124/73 | HR 69 | Temp 97.7°F | Resp 18 | Wt 163.1 lb

## 2020-11-27 DIAGNOSIS — Z79899 Other long term (current) drug therapy: Secondary | ICD-10-CM | POA: Insufficient documentation

## 2020-11-27 DIAGNOSIS — C5701 Malignant neoplasm of right fallopian tube: Secondary | ICD-10-CM

## 2020-11-27 DIAGNOSIS — Z5112 Encounter for antineoplastic immunotherapy: Secondary | ICD-10-CM | POA: Diagnosis present

## 2020-11-27 DIAGNOSIS — Z7189 Other specified counseling: Secondary | ICD-10-CM

## 2020-11-27 DIAGNOSIS — C786 Secondary malignant neoplasm of retroperitoneum and peritoneum: Secondary | ICD-10-CM | POA: Diagnosis not present

## 2020-11-27 LAB — CBC WITH DIFFERENTIAL/PLATELET
Abs Immature Granulocytes: 0.02 10*3/uL (ref 0.00–0.07)
Basophils Absolute: 0.1 10*3/uL (ref 0.0–0.1)
Basophils Relative: 1 %
Eosinophils Absolute: 0.6 10*3/uL — ABNORMAL HIGH (ref 0.0–0.5)
Eosinophils Relative: 10 %
HCT: 38.2 % (ref 36.0–46.0)
Hemoglobin: 12.7 g/dL (ref 12.0–15.0)
Immature Granulocytes: 0 %
Lymphocytes Relative: 29 %
Lymphs Abs: 1.7 10*3/uL (ref 0.7–4.0)
MCH: 32 pg (ref 26.0–34.0)
MCHC: 33.2 g/dL (ref 30.0–36.0)
MCV: 96.2 fL (ref 80.0–100.0)
Monocytes Absolute: 0.5 10*3/uL (ref 0.1–1.0)
Monocytes Relative: 8 %
Neutro Abs: 3.1 10*3/uL (ref 1.7–7.7)
Neutrophils Relative %: 52 %
Platelets: 195 10*3/uL (ref 150–400)
RBC: 3.97 MIL/uL (ref 3.87–5.11)
RDW: 13.7 % (ref 11.5–15.5)
WBC: 6 10*3/uL (ref 4.0–10.5)
nRBC: 0 % (ref 0.0–0.2)

## 2020-11-27 LAB — COMPREHENSIVE METABOLIC PANEL
ALT: 11 U/L (ref 0–44)
AST: 15 U/L (ref 15–41)
Albumin: 3.5 g/dL (ref 3.5–5.0)
Alkaline Phosphatase: 59 U/L (ref 38–126)
Anion gap: 12 (ref 5–15)
BUN: 21 mg/dL (ref 8–23)
CO2: 22 mmol/L (ref 22–32)
Calcium: 9.2 mg/dL (ref 8.9–10.3)
Chloride: 108 mmol/L (ref 98–111)
Creatinine, Ser: 1.19 mg/dL — ABNORMAL HIGH (ref 0.44–1.00)
GFR, Estimated: 50 mL/min — ABNORMAL LOW (ref >60)
Glucose, Bld: 139 mg/dL — ABNORMAL HIGH (ref 70–99)
Potassium: 4.3 mmol/L (ref 3.5–5.1)
Sodium: 142 mmol/L (ref 135–145)
Total Bilirubin: 0.3 mg/dL (ref 0.3–1.2)
Total Protein: 6.6 g/dL (ref 6.5–8.1)

## 2020-11-27 LAB — TOTAL PROTEIN, URINE DIPSTICK: Protein, ur: NEGATIVE mg/dL

## 2020-11-27 MED ORDER — HEPARIN SOD (PORK) LOCK FLUSH 100 UNIT/ML IV SOLN
500.0000 [IU] | Freq: Once | INTRAVENOUS | Status: AC | PRN
  Administered 2020-11-27: 500 [IU]
  Filled 2020-11-27: qty 5

## 2020-11-27 MED ORDER — SODIUM CHLORIDE 0.9 % IV SOLN
10.0000 mg/kg | Freq: Once | INTRAVENOUS | Status: AC
  Administered 2020-11-27: 800 mg via INTRAVENOUS
  Filled 2020-11-27: qty 32

## 2020-11-27 MED ORDER — SODIUM CHLORIDE 0.9 % IV SOLN
Freq: Once | INTRAVENOUS | Status: AC
  Filled 2020-11-27: qty 250

## 2020-11-27 MED ORDER — SODIUM CHLORIDE 0.9% FLUSH
10.0000 mL | Freq: Once | INTRAVENOUS | Status: AC
  Administered 2020-11-27: 10 mL
  Filled 2020-11-27: qty 10

## 2020-11-27 MED ORDER — SODIUM CHLORIDE 0.9% FLUSH
10.0000 mL | INTRAVENOUS | Status: DC | PRN
  Administered 2020-11-27: 10 mL
  Filled 2020-11-27: qty 10

## 2020-11-27 NOTE — Patient Instructions (Signed)
[facility]  Discharge Instructions: Thank you for choosing [facility] to provide your oncology and hematology care.   If you have a lab appointment with the Cancer Center, please go directly to the Cancer Center and check in at the registration area.   Wear comfortable clothing and clothing appropriate for easy access to any Portacath or PICC line.   We strive to give you quality time with your provider. You may need to reschedule your appointment if you arrive late (15 or more minutes).  Arriving late affects you and other patients whose appointments are after yours.  Also, if you miss three or more appointments without notifying the office, you may be dismissed from the clinic at the provider's discretion.      For prescription refill requests, have your pharmacy contact our office and allow 72 hours for refills to be completed.    Today you received the following chemotherapy and/or immunotherapy agents Avastin       To help prevent nausea and vomiting after your treatment, we encourage you to take your nausea medication as directed.  BELOW ARE SYMPTOMS THAT SHOULD BE REPORTED IMMEDIATELY: *FEVER GREATER THAN 100.4 F (38 C) OR HIGHER *CHILLS OR SWEATING *NAUSEA AND VOMITING THAT IS NOT CONTROLLED WITH YOUR NAUSEA MEDICATION *UNUSUAL SHORTNESS OF BREATH *UNUSUAL BRUISING OR BLEEDING *URINARY PROBLEMS (pain or burning when urinating, or frequent urination) *BOWEL PROBLEMS (unusual diarrhea, constipation, pain near the anus) TENDERNESS IN MOUTH AND THROAT WITH OR WITHOUT PRESENCE OF ULCERS (sore throat, sores in mouth, or a toothache) UNUSUAL RASH, SWELLING OR PAIN  UNUSUAL VAGINAL DISCHARGE OR ITCHING   Items with * indicate a potential emergency and should be followed up as soon as possible or go to the Emergency Department if any problems should occur.  Please show the CHEMOTHERAPY ALERT CARD or IMMUNOTHERAPY ALERT CARD at check-in to  the Emergency Department and triage nurse.  Should you have questions after your visit or need to cancel or reschedule your appointment, please contact [facility]  Dept: [phone]  and follow the prompts.  Office hours are 8:00 a.m. to 4:30 p.m. Monday - Friday. Please note that voicemails left after 4:00 p.m. may not be returned until the following business day.  We are closed weekends and major holidays. You have access to a nurse at all times for urgent questions. Please call the main number to the clinic Dept: [phone] and follow the prompts.   For any non-urgent questions, you may also contact your provider using MyChart. We now offer e-Visits for anyone 18 and older to request care online for non-urgent symptoms. For details visit [email or web address].   Also download the MyChart app! Go to the app store, search "MyChart", open the app, select [place], and log in with your MyChart username and password.  Due to Covid, a mask is required upon entering the hospital/clinic. If you do not have a mask, one will be given to you upon arrival. For doctor visits, patients may have 1 support person aged 18 or older with them. For treatment visits, patients cannot have anyone with them due to current Covid guidelines and our immunocompromised population.   

## 2020-12-08 ENCOUNTER — Telehealth: Payer: Self-pay

## 2020-12-08 NOTE — Telephone Encounter (Signed)
Notified Patient of completion of Cancer Information Form as requested. Fax transmission confirmation received and request for medical records forwarded to [facility] Management Department with signed Release of Information Form. Copy of completed form and records request mailed to Patient as requested.

## 2020-12-15 ENCOUNTER — Other Ambulatory Visit: Payer: Self-pay

## 2020-12-15 ENCOUNTER — Ambulatory Visit (HOSPITAL_COMMUNITY)
Admission: RE | Admit: 2020-12-15 | Discharge: 2020-12-15 | Disposition: A | Discharge status: Home / Self Care | Payer: Medicare Other | Source: Ambulatory Visit | Attending: Hematology and Oncology | Admitting: Hematology and Oncology

## 2020-12-15 ENCOUNTER — Inpatient Hospital Stay: Payer: Medicare Other

## 2020-12-15 ENCOUNTER — Encounter (HOSPITAL_COMMUNITY): Payer: Self-pay

## 2020-12-15 DIAGNOSIS — C5701 Malignant neoplasm of right fallopian tube: Secondary | ICD-10-CM

## 2020-12-15 DIAGNOSIS — Z5112 Encounter for antineoplastic immunotherapy: Secondary | ICD-10-CM | POA: Diagnosis not present

## 2020-12-15 DIAGNOSIS — Z7189 Other specified counseling: Secondary | ICD-10-CM

## 2020-12-15 LAB — CBC WITH DIFFERENTIAL/PLATELET
Abs Immature Granulocytes: 0.03 10*3/uL (ref 0.00–0.07)
Basophils Absolute: 0.1 10*3/uL (ref 0.0–0.1)
Basophils Relative: 1 %
Eosinophils Absolute: 0.6 10*3/uL — ABNORMAL HIGH (ref 0.0–0.5)
Eosinophils Relative: 8 %
HCT: 40.2 % (ref 36.0–46.0)
Hemoglobin: 12.9 g/dL (ref 12.0–15.0)
Immature Granulocytes: 0 %
Lymphocytes Relative: 25 %
Lymphs Abs: 1.7 10*3/uL (ref 0.7–4.0)
MCH: 31 pg (ref 26.0–34.0)
MCHC: 32.1 g/dL (ref 30.0–36.0)
MCV: 96.6 fL (ref 80.0–100.0)
Monocytes Absolute: 0.6 10*3/uL (ref 0.1–1.0)
Monocytes Relative: 9 %
Neutro Abs: 3.8 10*3/uL (ref 1.7–7.7)
Neutrophils Relative %: 57 %
Platelets: 187 10*3/uL (ref 150–400)
RBC: 4.16 MIL/uL (ref 3.87–5.11)
RDW: 13.6 % (ref 11.5–15.5)
WBC: 6.7 10*3/uL (ref 4.0–10.5)
nRBC: 0 % (ref 0.0–0.2)

## 2020-12-15 LAB — COMPREHENSIVE METABOLIC PANEL
ALT: 11 U/L (ref 0–44)
AST: 18 U/L (ref 15–41)
Albumin: 3.7 g/dL (ref 3.5–5.0)
Alkaline Phosphatase: 64 U/L (ref 38–126)
Anion gap: 10 (ref 5–15)
BUN: 16 mg/dL (ref 8–23)
CO2: 24 mmol/L (ref 22–32)
Calcium: 9.5 mg/dL (ref 8.9–10.3)
Chloride: 105 mmol/L (ref 98–111)
Creatinine, Ser: 1.09 mg/dL — ABNORMAL HIGH (ref 0.44–1.00)
GFR, Estimated: 56 mL/min — ABNORMAL LOW (ref >60)
Glucose, Bld: 93 mg/dL (ref 70–99)
Potassium: 4.9 mmol/L (ref 3.5–5.1)
Sodium: 139 mmol/L (ref 135–145)
Total Bilirubin: 0.4 mg/dL (ref 0.3–1.2)
Total Protein: 7 g/dL (ref 6.5–8.1)

## 2020-12-15 MED ORDER — HEPARIN SOD (PORK) LOCK FLUSH 100 UNIT/ML IV SOLN
500.0000 [IU] | Freq: Once | INTRAVENOUS | Status: AC
  Administered 2020-12-15: 500 [IU] via INTRAVENOUS

## 2020-12-15 MED ORDER — IOHEXOL 350 MG/ML SOLN
80.0000 mL | Freq: Once | INTRAVENOUS | Status: AC | PRN
  Administered 2020-12-15: 80 mL via INTRAVENOUS

## 2020-12-15 MED ORDER — HEPARIN SOD (PORK) LOCK FLUSH 100 UNIT/ML IV SOLN
INTRAVENOUS | Status: AC
  Filled 2020-12-15: qty 5

## 2020-12-15 MED ORDER — SODIUM CHLORIDE 0.9% FLUSH
10.0000 mL | Freq: Once | INTRAVENOUS | Status: AC
  Administered 2020-12-15: 10 mL

## 2020-12-17 ENCOUNTER — Other Ambulatory Visit: Payer: Self-pay | Admitting: Hematology and Oncology

## 2020-12-18 ENCOUNTER — Encounter: Payer: Self-pay | Admitting: Hematology and Oncology

## 2020-12-18 ENCOUNTER — Other Ambulatory Visit: Payer: Self-pay | Admitting: Hematology and Oncology

## 2020-12-18 ENCOUNTER — Other Ambulatory Visit: Payer: Self-pay

## 2020-12-18 ENCOUNTER — Inpatient Hospital Stay (HOSPITAL_BASED_OUTPATIENT_CLINIC_OR_DEPARTMENT_OTHER): Payer: Medicare Other | Admitting: Hematology and Oncology

## 2020-12-18 ENCOUNTER — Inpatient Hospital Stay: Payer: Medicare Other

## 2020-12-18 DIAGNOSIS — C5701 Malignant neoplasm of right fallopian tube: Secondary | ICD-10-CM

## 2020-12-18 DIAGNOSIS — I10 Essential (primary) hypertension: Secondary | ICD-10-CM

## 2020-12-18 DIAGNOSIS — L299 Pruritus, unspecified: Secondary | ICD-10-CM | POA: Diagnosis not present

## 2020-12-18 DIAGNOSIS — Z5112 Encounter for antineoplastic immunotherapy: Secondary | ICD-10-CM | POA: Diagnosis not present

## 2020-12-18 DIAGNOSIS — Z7189 Other specified counseling: Secondary | ICD-10-CM

## 2020-12-18 MED ORDER — SODIUM CHLORIDE 0.9 % IV SOLN
10.0000 mg/kg | Freq: Once | INTRAVENOUS | Status: AC
  Administered 2020-12-18: 800 mg via INTRAVENOUS
  Filled 2020-12-18: qty 32

## 2020-12-18 MED ORDER — SODIUM CHLORIDE 0.9 % IV SOLN: Freq: Once | INTRAVENOUS | Status: AC

## 2020-12-18 MED ORDER — SODIUM CHLORIDE 0.9% FLUSH
10.0000 mL | INTRAVENOUS | Status: DC | PRN
  Administered 2020-12-18: 10 mL

## 2020-12-18 MED ORDER — HEPARIN SOD (PORK) LOCK FLUSH 100 UNIT/ML IV SOLN
500.0000 [IU] | Freq: Once | INTRAVENOUS | Status: AC | PRN
  Administered 2020-12-18: 500 [IU]

## 2020-12-18 NOTE — Assessment & Plan Note (Signed)
Her blood pressure is well controlled We discussed importance of risk factor modification and lifestyle changes

## 2020-12-18 NOTE — Assessment & Plan Note (Signed)
She has been using some tanning products and I suspect that is the cause of her skin itching I recommend discontinuation of these products and to use Vaseline as needed to keep her skin from drying

## 2020-12-18 NOTE — Patient Instructions (Signed)
[facility]  ° Discharge Instructions: °Thank you for choosing [facility] to provide your oncology and hematology care.  ° °If you have a lab appointment with the Cancer Center, please go directly to the Cancer Center and check in at the registration area. °  °Wear comfortable clothing and clothing appropriate for easy access to any Portacath or PICC line.  ° °We strive to give you quality time with your provider. You may need to reschedule your appointment if you arrive late (15 or more minutes).  Arriving late affects you and other patients whose appointments are after yours.  Also, if you miss three or more appointments without notifying the office, you may be dismissed from the clinic at the provider’s discretion.    °  °For prescription refill requests, have your pharmacy contact our office and allow 72 hours for refills to be completed.   ° °Today you received the following chemotherapy and/or immunotherapy agents: Bevacizumab (Avastin)    °  °To help prevent nausea and vomiting after your treatment, we encourage you to take your nausea medication as directed. ° °BELOW ARE SYMPTOMS THAT SHOULD BE REPORTED IMMEDIATELY: °*FEVER GREATER THAN 100.4 F (38 °C) OR HIGHER °*CHILLS OR SWEATING °*NAUSEA AND VOMITING THAT IS NOT CONTROLLED WITH YOUR NAUSEA MEDICATION °*UNUSUAL SHORTNESS OF BREATH °*UNUSUAL BRUISING OR BLEEDING °*URINARY PROBLEMS (pain or burning when urinating, or frequent urination) °*BOWEL PROBLEMS (unusual diarrhea, constipation, pain near the anus) °TENDERNESS IN MOUTH AND THROAT WITH OR WITHOUT PRESENCE OF ULCERS (sore throat, sores in mouth, or a toothache) °UNUSUAL RASH, SWELLING OR PAIN  °UNUSUAL VAGINAL DISCHARGE OR ITCHING  ° °Items with * indicate a potential emergency and should be followed up as soon as possible or go to the Emergency Department if any problems should occur. ° °Please show the CHEMOTHERAPY ALERT CARD or IMMUNOTHERAPY ALERT CARD  at check-in to the Emergency Department and triage nurse. ° °Should you have questions after your visit or need to cancel or reschedule your appointment, please contact [facility]  Dept: [phone]  and follow the prompts.  Office hours are 8:00 a.m. to 4:30 p.m. Monday - Friday. Please note that voicemails left after 4:00 p.m. may not be returned until the following business day.  We are closed weekends and major holidays. You have access to a nurse at all times for urgent questions. Please call the main number to the clinic Dept: [phone] and follow the prompts. ° ° °For any non-urgent questions, you may also contact your provider using MyChart. We now offer e-Visits for anyone 18 and older to request care online for non-urgent symptoms. For details visit [email or web address]. °  °Also download the MyChart app! Go to the app store, search "MyChart", open the app, select [place], and log in with your MyChart username and password. ° °Due to Covid, a mask is required upon entering the hospital/clinic. If you do not have a mask, one will be given to you upon arrival. For doctor visits, patients may have 1 support person aged 18 or older with them. For treatment visits, patients cannot have anyone with them due to current Covid guidelines and our immunocompromised population.  ° °

## 2020-12-18 NOTE — Progress Notes (Signed)
[place] [facility] OFFICE PROGRESS NOTE  Patient Care Team: Gaynelle Arabian, MD as PCP - General (Family Medicine) Heath Lark, MD as Consulting Physician (Hematology and Oncology) Everitt Amber, MD as Consulting Physician (Obstetrics and Gynecology)  ASSESSMENT & PLAN:  Adenocarcinoma of right fallopian tube [facility]) Her blood pressure is better controlled with the addition of lisinopril She has no other symptoms We will proceed with treatment as scheduled She has no evidence of disease on CT imaging The role of treatment moving forward is of maintenance in nature I recommend spacing out interval between CT imaging Her next imaging study would be in February 2023  Essential hypertension Her blood pressure is well controlled We discussed importance of risk factor modification and lifestyle changes  Itch of skin She has been using some tanning products and I suspect that is the cause of her skin itching I recommend discontinuation of these products and to use Vaseline as needed to keep her skin from drying  No orders of the defined types were placed in this encounter.   All questions were answered. The patient knows to call the clinic with any problems, questions or concerns. The total time spent in the appointment was 20 minutes encounter with patients including review of chart and various tests results, discussions about plan of care and coordination of care plan   Heath Lark, MD 12/18/2020 9:21 AM  INTERVAL HISTORY: Please see below for problem oriented charting. she returns for treatment follow-up with her husband She is receiving bevacizumab as maintenance treatment for recurrent fallopian tube cancer Her blood pressure is doing well at home She had recent loose stool after CT imaging She complained of skin itching She uses sun tanning products on a regular basis  REVIEW OF SYSTEMS:   Constitutional: Denies fevers, chills or abnormal weight loss Eyes: Denies  blurriness of vision Ears, nose, mouth, throat, and face: Denies mucositis or sore throat Respiratory: Denies cough, dyspnea or wheezes Cardiovascular: Denies palpitation, chest discomfort or lower extremity swelling Gastrointestinal:  Denies nausea, heartburn or change in bowel habits Lymphatics: Denies new lymphadenopathy or easy bruising Neurological:Denies numbness, tingling or new weaknesses Behavioral/Psych: Mood is stable, no new changes  All other systems were reviewed with the patient and are negative.  I have reviewed the past medical history, past surgical history, social history and family history with the patient and they are unchanged from previous note.  ALLERGIES:  is allergic to dilaudid [hydromorphone hcl], morphine and related, and sudafed [pseudoephedrine hcl].  MEDICATIONS:  Current Outpatient Medications  Medication Sig Dispense Refill   acetaminophen (TYLENOL) 325 MG tablet Take 975 mg by mouth every 6 (six) hours as needed (pain).     amLODipine (NORVASC) 10 MG tablet TAKE 1 TABLET(10 MG) BY MOUTH DAILY 30 tablet 9   lidocaine-prilocaine (EMLA) cream Apply to affected area once daily as directed. 30 g 3   lisinopril (ZESTRIL) 20 MG tablet TAKE 1 TABLET(20 MG) BY MOUTH DAILY 90 tablet 3   ondansetron (ZOFRAN) 8 MG tablet Take 1 tablet (8 mg total) by mouth every 8 (eight) hours as needed. 30 tablet 1   prochlorperazine (COMPAZINE) 10 MG tablet TAKE 1 TABLET BY MOUTH EVERY 6 HOURS AS NEEDED FOR NAUSEA AND/OR VOMITING 30 tablet 1   traMADol (ULTRAM) 50 MG tablet Take 1 tablet (50 mg total) by mouth every 6 (six) hours as needed. 30 tablet 0   venlafaxine XR (EFFEXOR-XR) 37.5 MG 24 hr capsule TAKE 1 CAPSULE BY MOUTH DAILY WITH BREAKFAST  90 capsule 3   No current facility-administered medications for this visit.   Facility-Administered Medications Ordered in Other Visits  Medication Dose Route Frequency Provider Last Rate Last Admin   bevacizumab-bvzr (ZIRABEV) 800 mg  in sodium chloride 0.9 % 100 mL chemo infusion  10 mg/kg (Treatment Plan Recorded) Intravenous Once Alvy Bimler, Raiya Stainback, MD       heparin lock flush 100 unit/mL  500 Units Intracatheter Once PRN Alvy Bimler, Patt Steinhardt, MD       sodium chloride flush (NS) 0.9 % injection 10 mL  10 mL Intravenous PRN Alvy Bimler, Marcus Groll, MD   10 mL at 02/11/17 0839   sodium chloride flush (NS) 0.9 % injection 10 mL  10 mL Intravenous PRN Alvy Bimler, Deren Degrazia, MD   10 mL at 03/04/17 1510   sodium chloride flush (NS) 0.9 % injection 10 mL  10 mL Intracatheter PRN Heath Lark, MD        SUMMARY OF ONCOLOGIC HISTORY: Oncology History Overview Note  Neg genetics   Adenocarcinoma of right fallopian tube ([place])  08/12/2016 Initial Diagnosis   The patient has many months of vague symptoms of fullness in the pelvis, urinary frequency and difficulty with defecation. She denies vaginal bleeding or rectal bleeding.     08/12/2016 Imaging   Abdomen X-ray in the ER: Moderate colonic stool burden without evidence of enteric obstruction   11/13/2016 Imaging   TVUS was performed on 11/13/16 which showed a large hypoechoic lobular mass seen posterior to the uterus measuring 18.2x16.1x11.8cm, blood flow was seen along the periphery of the mass. It was considered to be likely to be a fibroid vs pelvic mass vs ovarian mass. Neither ovary was removed. Moderate amount of free fluid in the pelvis. THe uterus measures 4x10x4cm. The endometrium is 34m.    12/05/2016 Imaging   MR pelvis 1. Mild limitations as detailed above. 2. Heterogeneous pelvic mass is favored to arise from the right ovary. Favor a solid ovarian neoplasm such as fibroma/Brenner's tumor. Given lesion size and heterogeneous T2 signal out, complicating torsion cannot be excluded. 3. Moderate abdominopelvic ascites, without specific evidence of peritoneal metastasis. Consider further evaluation with contrast-enhanced abdominopelvic CT.    12/26/2016 Pathology Results   1. Uterus, ovaries and fallopian  tubes - ADENOCARCINOMA INVOLVING RIGHT FALLOPIAN TUBE, RIGHT AND LEFT OVARIES, UTERINE SEROSA AND PELVIC MASS. - SEE ONCOLOGY TABLE AND COMMENT. - UTERINE CERVIX, ENDOMETRIUM, MYOMETRIUM AND LEFT FALLOPIAN TUBE FREE OF TUMOR. 2. Omentum, resection for tumor - ADENOCARCINOMA. Microscopic Comment 1. ONCOLOGY TABLE - FALLOPIAN TUBE. SEE COMMENT 1. Specimen, including laterality: Uterus, bilateral adnexa, pelvic mass and omentum. 2. Procedure: Hysterectomy with bilateral salpingo-oophorectomy, pelvic mass excision and omentectomy. 3. Lymph node sampling performed: No 4. Tumor site: See comment. 5. Tumor location in fallopian tube: Right fallopian tube fimbria 6. Specimen integrity (intact/ruptured/disrupted): Intact 7. Tumor size (cm): 18 cm, see comment. 8. Histologic type: Adenocarcinoma 9. Grade: High grade 10. Microscopic tumor extension: Tumor involves right fallopian tube, right and left ovaries, uterine serosa, pelvic mass and omentum. 11. Margins: See comment. 12. Lymph-Vascular invasion: Present 13. Lymph nodes: # examined: 0; # positive: N/A 14. TNM: pT3c, pNX 15. FIGO Stage (based on pathologic findings, needs clinical correlation: III-C 16. Comments: There is an 18 cm pelvic mass which is a high grade adenocarcinoma and there is a 12.2 cm  segment of omentum which is extensively involved with adenocarcinoma. The tumor also involves the fimbria of the right fallopian tube and the parenchyma of the right and left ovaries  as well as uterine serosa. The fimbria of the right fallopian has intraepithelial atypia consistent with a precursor lesion and therefore this is most consistent with primary fallopian tube adenocarcinoma. A primary peritoneal serous carcinoma is a less likely possibility.    12/26/2016 Pathology Results   PERITONEAL/ASCITIC FLUID(SPECIMEN 1 OF 1 COLLECTED 12/26/16): POORLY DIFFERENTIATED ADENOCARCINOMA   12/26/2016 Surgery   Procedure(s) Performed: Exploratory  laparotomy with total abdominal hysterectomy, bilateral salpingo-oophorectomy, omentectomy radical tumor debulking for ovarian cancer .   Surgeon: Thereasa Solo, MD.      Operative Findings: 20cm left ovarian mass densely adherent to the posterior uterus, right tube and ovary, cervix, sigmoid colon. 10cm omental cake. 4L ascites. 38m size tumor nodules on the serosa of the terminal ileum and proximal sigmoid colon. 144mnodules on right diaphragm.    This represented an optimal cytoreduction (R1) with 36m65modules on intestine and diaphragm representing gross visible disease   01/16/2017 Procedure   Placement of single lumen port a cath via right internal jugular vein. The catheter tip lies at the cavo-atrial junction. A power injectable port a cath was placed and is ready for immediate use.   01/17/2017 Imaging   1. 3.5 x 7.2 x 4.6 cm fluid collection along the vaginal cuff, posterior the bladder and extending into the right adnexal space. This lesion demonstrates rim enhancement. Imaging features could be related to a loculated postoperative seroma or hematoma. Superinfection cannot be excluded by CT. Given debulking surgery was 3 weeks ago, this entire structure is un likely to represent neoplasm, but peritoneal involvement could have this appearance. 2. Small fluid collection in the left para colic gutter without rim enhancement. 3. Irregular/nodular appearance of the peritoneal M in the anatomic pelvis with areas of subtle nodularity between the stomach and the spleen. Close attention in these regions on follow-up recommended as metastatic disease is a concern. 4. Mildly enlarged hepatoduodenal ligament lymph node. Attention on follow-up recommended.   01/20/2017 Tumor Marker   Patient's tumor was tested for the following markers: CA-125 Results of the tumor marker test revealed 46.6   01/28/2017 Genetic Testing       Negative genetic testing on the [facility].  The [facility] panel  offered by [employer] sequencing and deletion/duplication testing of the following 28 genes: APC, ATM, BARD1, BMPR1A, BRCA1, BRCA2, BRIP1, CHD1, CDK4, CDKN2A, CHEK2, EPCAM (large rearrangement only), MLH1, MSH2, MSH6, MUTYH, NBN, PALB2, PMS2, PTEN, RAD51C, RAD51D, SMAD4, STK11, and TP53. Sequencing was performed for select regions of POLE and POLD1, and large rearrangement analysis was performed for select regions of GREM1. The report date is January 28, 2017.  HRD testing looking for genomic instability and BRCA mutations was negative.  The report date of this test is January 27, 2017.    01/31/2017 Tumor Marker   Patient's tumor was tested for the following markers: CA-125 Results of the tumor marker test revealed 22.4   03/04/2017 Tumor Marker   Patient's tumor was tested for the following markers: CA-125 Results of the tumor marker test revealed 13.8   04/18/2017 Tumor Marker   Patient's tumor was tested for the following markers: CA-125 Results of the tumor marker test revealed 11.9   05/05/2017 Tumor Marker   Patient's tumor was tested for the following markers: CA-125 Results of the tumor marker test revealed 12   05/26/2017 Tumor Marker   Patient's tumor was tested for the following markers: CA-125 Results of the tumor marker test revealed 10.6  05/26/2017 Imaging   1. A previously noted postoperative fluid collection in the low pelvis and residual ascites seen on the prior examination has resolved on today's study. No findings to suggest residual/recurrent disease on today's examination. No definite solid organ metastasis identified in the abdomen or pelvis. No lymphadenopathy. 2. Aortic atherosclerosis.   06/10/2017 Procedure   Successful right IJ vein Port-A-Cath explant.   03/09/2018 Tumor Marker   Patient's tumor was tested for the following markers: CA-125 Results of the tumor marker test revealed 12.1   05/26/2018 Tumor Marker   Patient's tumor  was tested for the following markers: CA-125 Results of the tumor marker test revealed 13.8   09/09/2018 Tumor Marker   Patient's tumor was tested for the following markers: CA-125 Results of the tumor marker test revealed 23.9   12/18/2018 Tumor Marker   Patient's tumor was tested for the following markers: CA-125 Results of the tumor marker test revealed 43.8   02/27/2019 - 02/28/2019 Hospital Admission   She was admitted for bowel obstruction   02/27/2019 Imaging   1. High-grade small bowel obstruction with transition point in the pelvis in an area of suspected desmoplastic reaction surrounding a 1.3 cm spiculated nodule in the mesentery, suspicious for carcinoid. There is hyperenhancement near the tip of the appendix and loss of the normal fat plane between it and the adjacent sigmoid colon, potentially the site of primary lesion. 2. Multiple new small subcentimeter hyperdense lesions scattered along the liver capsule, concerning for metastases. Enlarged hyperenhancing gastrohepatic and portacaval lymph nodes concerning for nodal metastases. 3. Very mild right hydroureteronephrosis to the level of the desmoplastic reaction in the pelvis, potentially involving the right ureter. 4. Infraumbilical ventral abdominal wall diastasis containing a small nondilated portion of transverse colon. 5. Trace ascites. 6. Cholelithiasis. 7.  Aortic atherosclerosis (ICD10-I70.0).     03/05/2019 Tumor Marker   Patient's tumor was tested for the following markers: CA-125 Results of the tumor marker test revealed 70.1   03/12/2019 Echocardiogram   IMPRESSIONS     1. Left ventricular ejection fraction, by visual estimation, is 60 to 65%. The left ventricle has normal function. There is no left ventricular hypertrophy.  2. Left ventricular diastolic parameters are consistent with Grade I diastolic dysfunction (impaired relaxation).  3. Global right ventricle has normal systolic function.The right  ventricular size is normal. No increase in right ventricular wall thickness.  4. Left atrial size was normal.  5. Right atrial size was normal.  6. The pericardium was not assessed.  7. The mitral valve is normal in structure. No evidence of mitral valve regurgitation.  8. The tricuspid valve is normal in structure. Tricuspid valve regurgitation is trivial.  9. The aortic valve is normal in structure. Aortic valve regurgitation is not visualized. 10. The pulmonic valve was grossly normal. Pulmonic valve regurgitation is not visualized. 11. The average left ventricular global longitudinal strain is -17.6 %.   03/16/2019 Procedure   Successful placement of a right internal jugular approach power injectable Port-A-Cath. The catheter is ready for immediate use.     04/19/2019 Tumor Marker   Patient's tumor was tested for the following markers: CA-125 Results of the tumor marker test revealed 39.8   05/17/2019 Tumor Marker   Patient's tumor was tested for the following markers: CA-125 Results of the tumor marker test revealed 27   05/28/2019 Tumor Marker   Patient's tumor was tested for the following markers: CA-125 Results of the tumor marker test revealed 27.7.   06/11/2019  Imaging   1. No new or progressive metastatic disease in the abdomen or pelvis. 2. Tiny noncalcified perisplenic implant is decreased. Calcified pericardiophrenic, retroperitoneal and bilateral inguinal lymph nodes and scattered small calcified perihepatic and perisplenic implants are stable. 3.  Aortic Atherosclerosis (ICD10-I70.0).       06/17/2019 Echocardiogram    1. Left ventricular ejection fraction, by estimation, is 65 to 70%. The left ventricle has normal function. The left ventricle has no regional wall motion abnormalities. Left ventricular diastolic parameters were normal.  2. Right ventricular systolic function is normal. The right ventricular size is normal.  3. The mitral valve is normal in structure and  function. No evidence of mitral valve regurgitation. No evidence of mitral stenosis.  4. The aortic valve is normal in structure and function. Aortic valve regurgitation is not visualized. No aortic stenosis is present.   06/21/2019 Tumor Marker   Patient's tumor was tested for the following markers: CA-125 Results of the tumor marker test revealed 18.7   07/19/2019 Tumor Marker   Patient's tumor was tested for the following markers: CA-125 Results of the tumor marker test revealed 16.7   08/30/2019 Tumor Marker   Patient's tumor was tested for the following markers: CA-125. Results of the tumor marker test revealed 13.9   09/15/2019 Echocardiogram    1. Left ventricular ejection fraction, by estimation, is 65 to 70%. The left ventricle has normal function. The left ventricle has no regional wall motion abnormalities. Left ventricular diastolic parameters are consistent with Grade I diastolic dysfunction (impaired relaxation). The average left ventricular global longitudinal strain is 18.1 %. The global longitudinal strain is normal.  2. Right ventricular systolic function is normal. The right ventricular size is normal.  3. The mitral valve is normal in structure. No evidence of mitral valve regurgitation. No evidence of mitral stenosis.  4. The aortic valve is normal in structure. Aortic valve regurgitation is not visualized. No aortic stenosis is present.  5. The inferior vena cava is normal in size with greater than 50% respiratory variability, suggesting right atrial pressure of 3 mmHg.     09/24/2019 Imaging   1. Stable exam. No new or progressive metastatic disease on today's study. 2. Small subcapsular hypodensity in the lateral spleen, new in the interval, but indeterminate. Attention on follow-up recommended. 3. The calcified upper abdominal and groin lymph nodes are stable as are the scattered calcified and noncalcified peritoneal implants. 4. Stable midline ventral hernia containing a  short segment of colon without complicating features. 5. Aortic Atherosclerosis (ICD10-I70.0).     10/04/2019 -  Chemotherapy    Patient is on Treatment Plan: OVARIAN BEVACIZUMAB Q21D       10/04/2019 Tumor Marker   Patient's tumor was tested for the following markers: CA-125. Results of the tumor marker test revealed 12.9.   11/01/2019 Tumor Marker   Patient's tumor was tested for the following markers: CA-125 Results of the tumor marker test revealed 15.4   12/13/2019 Tumor Marker   Patient's tumor was tested for the following markers: CA-125 Results of the tumor marker test revealed 14.8   12/31/2019 Imaging   1. Unchanged tiny peritoneal nodule adjacent to the spleen measuring 6 mm. Other previously noted peritoneal nodules are imperceptible on present examination. 2. Unchanged prominent, partially calcified portacaval lymph node and bilateral inguinal lymph nodes. 3. Unchanged subtle, nonspecific subcapsular lesion of the spleen.  4. No evidence of new metastatic disease in the abdomen or pelvis. 5. Status post hysterectomy. 6. Unchanged low  midline ventral abdominal hernia containing a single loop of nonobstructed transverse colon. 7. Cholelithiasis. 8. Aortic Atherosclerosis (ICD10-I70.0).       01/03/2020 Tumor Marker   Patient's tumor was tested for the following markers: CA-125 Results of the tumor marker test revealed 11.7.   01/24/2020 Tumor Marker   Patient's tumor was tested for the following markers: CA-125. Results of the tumor marker test revealed 15.1   03/06/2020 Tumor Marker   Patient's tumor was tested for the following markers: CA-125. Results of the tumor marker test revealed 20.3   03/27/2020 Tumor Marker   Patient's tumor was tested for the following markers: CA-125 Results of the tumor marker test revealed 23.7   05/11/2020 Tumor Marker   Patient's tumor was tested for the following markers: CA-125. Results of the tumor marker test revealed 25.4    06/23/2020 Imaging   1. Cholelithiasis with gallbladder wall thickening and mild infiltrative edema in the porta hepatis, raising the possibility of acute cholecystitis. There is truncation of the common bile duct distally and distal choledocholithiasis is difficult to exclude. Correlate with bilirubin levels. If clinically warranted, MRCP could be utilized for further characterization. 2. Stable tiny perisplenic nodule. 3. Other imaging findings of potential clinical significance: Small type 1 hiatal hernia. Prominent stool throughout the colon favors constipation. Ventral infraumbilical hernia contains transverse colon without findings of strangulation or obstruction. Small supraumbilical hernia contains adipose tissue. Lumbar degenerative disc disease at L5-S1. Stable tiny nodule along the lateral margin of the spleen, nonspecific. 4. Aortic atherosclerosis.     12/15/2020 Imaging   No evidence of recurrent or metastatic carcinoma within the abdomen or pelvis.   Stable ventral hernia containing transverse colon. No evidence of bowel obstruction or strangulation.   Cholelithiasis. No radiographic evidence of cholecystitis.   Tiny hiatal hernia.   Aortic Atherosclerosis (ICD10-I70.0).       PHYSICAL EXAMINATION: ECOG PERFORMANCE STATUS: 1 - Symptomatic but completely ambulatory  Vitals:   12/18/20 0853  BP: 134/63  Pulse: 72  Resp: 18  Temp: (!) 97.4 F (36.3 C)  SpO2: 100%   Filed Weights   12/18/20 0853  Weight: 164 lb 9.6 oz (74.7 kg)    GENERAL:alert, no distress and comfortable SKIN: skin color, texture, turgor are normal, no rashes or significant lesions EYES: normal, Conjunctiva are pink and non-injected, sclera clear OROPHARYNX:no exudate, no erythema and lips, buccal mucosa, and tongue normal  NECK: supple, thyroid normal size, non-tender, without nodularity LYMPH:  no palpable lymphadenopathy in the cervical, axillary or inguinal LUNGS: clear to auscultation and  percussion with normal breathing effort HEART: regular rate & rhythm and no murmurs and no lower extremity edema ABDOMEN:abdomen soft, non-tender and normal bowel sounds Musculoskeletal:no cyanosis of digits and no clubbing  NEURO: alert & oriented x 3 with fluent speech, no focal motor/sensory deficits  LABORATORY DATA:  I have reviewed the data as listed    Component Value Date/Time   NA 139 12/15/2020 0810   NA 139 04/14/2017 0742   K 4.9 12/15/2020 0810   K 4.0 04/14/2017 0742   CL 105 12/15/2020 0810   CO2 24 12/15/2020 0810   CO2 21 (L) 04/14/2017 0742   GLUCOSE 93 12/15/2020 0810   GLUCOSE 247 (H) 04/14/2017 0742   BUN 16 12/15/2020 0810   BUN 13.1 04/14/2017 0742   CREATININE 1.09 (H) 12/15/2020 0810   CREATININE 0.97 04/17/2020 0812   CREATININE 0.9 04/14/2017 0742   CALCIUM 9.5 12/15/2020 0810   CALCIUM 9.2 04/14/2017  0742   PROT 7.0 12/15/2020 0810   PROT 7.3 04/14/2017 0742   ALBUMIN 3.7 12/15/2020 0810   ALBUMIN 3.9 04/14/2017 0742   AST 18 12/15/2020 0810   AST 18 04/17/2020 0812   AST 17 04/14/2017 0742   ALT 11 12/15/2020 0810   ALT 11 04/17/2020 0812   ALT 14 04/14/2017 0742   ALKPHOS 64 12/15/2020 0810   ALKPHOS 70 04/14/2017 0742   BILITOT 0.4 12/15/2020 0810   BILITOT 0.3 04/17/2020 0812   BILITOT 0.37 04/14/2017 0742   GFRNONAA 56 (L) 12/15/2020 0810   GFRNONAA >60 04/17/2020 0812   GFRAA >60 01/24/2020 0924   GFRAA >60 07/19/2019 0951    No results found for: SPEP, UPEP  Lab Results  Component Value Date   WBC 6.7 12/15/2020   NEUTROABS 3.8 12/15/2020   HGB 12.9 12/15/2020   HCT 40.2 12/15/2020   MCV 96.6 12/15/2020   PLT 187 12/15/2020      Chemistry      Component Value Date/Time   NA 139 12/15/2020 0810   NA 139 04/14/2017 0742   K 4.9 12/15/2020 0810   K 4.0 04/14/2017 0742   CL 105 12/15/2020 0810   CO2 24 12/15/2020 0810   CO2 21 (L) 04/14/2017 0742   BUN 16 12/15/2020 0810   BUN 13.1 04/14/2017 0742   CREATININE 1.09  (H) 12/15/2020 0810   CREATININE 0.97 04/17/2020 0812   CREATININE 0.9 04/14/2017 0742      Component Value Date/Time   CALCIUM 9.5 12/15/2020 0810   CALCIUM 9.2 04/14/2017 0742   ALKPHOS 64 12/15/2020 0810   ALKPHOS 70 04/14/2017 0742   AST 18 12/15/2020 0810   AST 18 04/17/2020 0812   AST 17 04/14/2017 0742   ALT 11 12/15/2020 0810   ALT 11 04/17/2020 0812   ALT 14 04/14/2017 0742   BILITOT 0.4 12/15/2020 0810   BILITOT 0.3 04/17/2020 0812   BILITOT 0.37 04/14/2017 0742       RADIOGRAPHIC STUDIES: I have reviewed multiple imaging studies with the patient and her husband I have personally reviewed the radiological images as listed and agreed with the findings in the report. CT ABDOMEN PELVIS W CONTRAST  Result Date: 12/15/2020 CLINICAL DATA:  Follow-up ovarian carcinoma. Undergoing chemotherapy. EXAM: CT ABDOMEN AND PELVIS WITH CONTRAST TECHNIQUE: Multidetector CT imaging of the abdomen and pelvis was performed using the standard protocol following bolus administration of intravenous contrast. CONTRAST:  59m OMNIPAQUE IOHEXOL 350 MG/ML SOLN COMPARISON:  06/23/2020 FINDINGS: Lower Chest: No acute findings. Hepatobiliary: No hepatic masses identified. A small approximately 1 cm gallstone is again noted, however there is no evidence acute cholecystitis or biliary ductal dilatation. Pancreas:  No mass or inflammatory changes. Spleen: Within normal limits in size and appearance. Adrenals/Urinary Tract: No masses identified. No evidence of ureteral calculi or hydronephrosis. Stomach/Bowel: Tiny hiatal hernia again noted. No evidence of obstruction, inflammatory process or abnormal fluid collections. Normal appendix visualized. A suprapubic midline ventral hernia is again seen containing a loop of transverse colon. No evidence of bowel obstruction strangulation. Vascular/Lymphatic: No pathologically enlarged lymph nodes. No acute vascular findings. Aortic atherosclerotic calcification noted.  Reproductive: Prior hysterectomy noted. Adnexal regions are unremarkable in appearance. No evidence of peritoneal masses or ascites. Other:  None. Musculoskeletal:  No suspicious bone lesions identified. IMPRESSION: No evidence of recurrent or metastatic carcinoma within the abdomen or pelvis. Stable ventral hernia containing transverse colon. No evidence of bowel obstruction or strangulation. Cholelithiasis. No radiographic evidence of  cholecystitis. Tiny hiatal hernia. Aortic Atherosclerosis (ICD10-I70.0). Electronically Signed   By: Marlaine Hind M.D.   On: 12/15/2020 16:14

## 2020-12-18 NOTE — Assessment & Plan Note (Signed)
Her blood pressure is better controlled with the addition of lisinopril She has no other symptoms We will proceed with treatment as scheduled She has no evidence of disease on CT imaging The role of treatment moving forward is of maintenance in nature I recommend spacing out interval between CT imaging Her next imaging study would be in February 2023

## 2021-01-01 ENCOUNTER — Encounter: Payer: Self-pay | Admitting: Hematology and Oncology

## 2021-01-08 ENCOUNTER — Other Ambulatory Visit: Payer: Self-pay

## 2021-01-08 ENCOUNTER — Inpatient Hospital Stay: Payer: Medicare Other | Attending: Hematology and Oncology

## 2021-01-08 ENCOUNTER — Inpatient Hospital Stay (HOSPITAL_BASED_OUTPATIENT_CLINIC_OR_DEPARTMENT_OTHER): Payer: Medicare Other | Admitting: Hematology and Oncology

## 2021-01-08 ENCOUNTER — Encounter: Payer: Self-pay | Admitting: Hematology and Oncology

## 2021-01-08 ENCOUNTER — Inpatient Hospital Stay: Payer: Medicare Other

## 2021-01-08 DIAGNOSIS — Z5112 Encounter for antineoplastic immunotherapy: Secondary | ICD-10-CM | POA: Insufficient documentation

## 2021-01-08 DIAGNOSIS — Z79899 Other long term (current) drug therapy: Secondary | ICD-10-CM | POA: Diagnosis not present

## 2021-01-08 DIAGNOSIS — I10 Essential (primary) hypertension: Secondary | ICD-10-CM

## 2021-01-08 DIAGNOSIS — Z7189 Other specified counseling: Secondary | ICD-10-CM

## 2021-01-08 DIAGNOSIS — C7963 Secondary malignant neoplasm of bilateral ovaries: Secondary | ICD-10-CM | POA: Insufficient documentation

## 2021-01-08 DIAGNOSIS — C5701 Malignant neoplasm of right fallopian tube: Secondary | ICD-10-CM | POA: Diagnosis present

## 2021-01-08 DIAGNOSIS — R7989 Other specified abnormal findings of blood chemistry: Secondary | ICD-10-CM

## 2021-01-08 LAB — CBC WITH DIFFERENTIAL/PLATELET
Abs Immature Granulocytes: 0.06 10*3/uL (ref 0.00–0.07)
Basophils Absolute: 0.1 10*3/uL (ref 0.0–0.1)
Basophils Relative: 1 %
Eosinophils Absolute: 0.8 10*3/uL — ABNORMAL HIGH (ref 0.0–0.5)
Eosinophils Relative: 11 %
HCT: 40.9 % (ref 36.0–46.0)
Hemoglobin: 13.2 g/dL (ref 12.0–15.0)
Immature Granulocytes: 1 %
Lymphocytes Relative: 19 %
Lymphs Abs: 1.3 10*3/uL (ref 0.7–4.0)
MCH: 31.5 pg (ref 26.0–34.0)
MCHC: 32.3 g/dL (ref 30.0–36.0)
MCV: 97.6 fL (ref 80.0–100.0)
Monocytes Absolute: 0.5 10*3/uL (ref 0.1–1.0)
Monocytes Relative: 8 %
Neutro Abs: 4.1 10*3/uL (ref 1.7–7.7)
Neutrophils Relative %: 60 %
Platelets: 178 10*3/uL (ref 150–400)
RBC: 4.19 MIL/uL (ref 3.87–5.11)
RDW: 13.4 % (ref 11.5–15.5)
WBC: 6.9 10*3/uL (ref 4.0–10.5)
nRBC: 0 % (ref 0.0–0.2)

## 2021-01-08 LAB — COMPREHENSIVE METABOLIC PANEL
ALT: 9 U/L (ref 0–44)
AST: 14 U/L — ABNORMAL LOW (ref 15–41)
Albumin: 3.6 g/dL (ref 3.5–5.0)
Alkaline Phosphatase: 66 U/L (ref 38–126)
Anion gap: 11 (ref 5–15)
BUN: 16 mg/dL (ref 8–23)
CO2: 23 mmol/L (ref 22–32)
Calcium: 9.3 mg/dL (ref 8.9–10.3)
Chloride: 105 mmol/L (ref 98–111)
Creatinine, Ser: 1.18 mg/dL — ABNORMAL HIGH (ref 0.44–1.00)
GFR, Estimated: 51 mL/min — ABNORMAL LOW (ref >60)
Glucose, Bld: 126 mg/dL — ABNORMAL HIGH (ref 70–99)
Potassium: 4.3 mmol/L (ref 3.5–5.1)
Sodium: 139 mmol/L (ref 135–145)
Total Bilirubin: 0.4 mg/dL (ref 0.3–1.2)
Total Protein: 6.8 g/dL (ref 6.5–8.1)

## 2021-01-08 LAB — TOTAL PROTEIN, URINE DIPSTICK: Protein, ur: NEGATIVE mg/dL

## 2021-01-08 MED ORDER — SODIUM CHLORIDE 0.9% FLUSH
10.0000 mL | Freq: Once | INTRAVENOUS | Status: AC
  Administered 2021-01-08: 10 mL

## 2021-01-08 MED ORDER — SODIUM CHLORIDE 0.9 % IV SOLN
10.0000 mg/kg | Freq: Once | INTRAVENOUS | Status: AC
  Administered 2021-01-08: 800 mg via INTRAVENOUS
  Filled 2021-01-08: qty 32

## 2021-01-08 MED ORDER — SODIUM CHLORIDE 0.9% FLUSH
10.0000 mL | INTRAVENOUS | Status: DC | PRN
  Administered 2021-01-08: 10 mL

## 2021-01-08 MED ORDER — HEPARIN SOD (PORK) LOCK FLUSH 100 UNIT/ML IV SOLN
500.0000 [IU] | Freq: Once | INTRAVENOUS | Status: AC | PRN
  Administered 2021-01-08: 500 [IU]

## 2021-01-08 MED ORDER — SODIUM CHLORIDE 0.9 % IV SOLN: Freq: Once | INTRAVENOUS | Status: AC

## 2021-01-08 NOTE — Progress Notes (Signed)
[place] OFFICE PROGRESS NOTE  Patient Care Team: Gaynelle Arabian, MD as PCP - General (Family Medicine) Heath Lark, MD as Consulting Physician (Hematology and Oncology) Everitt Amber, MD as Consulting Physician (Obstetrics and Gynecology)  ASSESSMENT & PLAN:  Adenocarcinoma of right fallopian tube [facility]) Her blood pressure is better controlled with the addition of lisinopril She has no other symptoms We will proceed with treatment as scheduled She has no evidence of disease on CT imaging The role of treatment moving forward is of maintenance in nature I recommend spacing out interval between CT imaging Her next imaging study would be in February 2023  Essential hypertension Her blood pressure is well controlled We discussed importance of risk factor modification and lifestyle changes  Elevated serum creatinine She has intermittent elevated serum creatinine Observe closely for now I recommend increase fluid hydration as tolerated  No orders of the defined types were placed in this encounter.   All questions were answered. The patient knows to call the clinic with any problems, questions or concerns. The total time spent in the appointment was 20 minutes encounter with patients including review of chart and various tests results, discussions about plan of care and coordination of care plan   Heath Lark, MD 01/08/2021 10:33 AM  INTERVAL HISTORY: Please see below for problem oriented charting. she returns for treatment follow-up on bevacizumab for recurrent fallopian tube cancer She is doing well Even though her blood pressure is mildly elevated today, documented blood pressure from home were within normal range Denies abdominal pain, nausea or changes in bowel habits  REVIEW OF SYSTEMS:   Constitutional: Denies fevers, chills or abnormal weight loss Eyes: Denies blurriness of vision Ears, nose, mouth, throat, and face: Denies mucositis or sore  throat Respiratory: Denies cough, dyspnea or wheezes Cardiovascular: Denies palpitation, chest discomfort or lower extremity swelling Gastrointestinal:  Denies nausea, heartburn or change in bowel habits Skin: Denies abnormal skin rashes Lymphatics: Denies new lymphadenopathy or easy bruising Neurological:Denies numbness, tingling or new weaknesses Behavioral/Psych: Mood is stable, no new changes  All other systems were reviewed with the patient and are negative.  I have reviewed the past medical history, past surgical history, social history and family history with the patient and they are unchanged from previous note.  ALLERGIES:  is allergic to dilaudid [hydromorphone hcl], morphine and related, and sudafed [pseudoephedrine hcl].  MEDICATIONS:  Current Outpatient Medications  Medication Sig Dispense Refill   acetaminophen (TYLENOL) 325 MG tablet Take 975 mg by mouth every 6 (six) hours as needed (pain).     amLODipine (NORVASC) 10 MG tablet TAKE 1 TABLET(10 MG) BY MOUTH DAILY 30 tablet 9   lidocaine-prilocaine (EMLA) cream Apply to affected area once daily as directed. 30 g 3   lisinopril (ZESTRIL) 20 MG tablet TAKE 1 TABLET(20 MG) BY MOUTH DAILY 90 tablet 3   ondansetron (ZOFRAN) 8 MG tablet Take 1 tablet (8 mg total) by mouth every 8 (eight) hours as needed. 30 tablet 1   prochlorperazine (COMPAZINE) 10 MG tablet TAKE 1 TABLET BY MOUTH EVERY 6 HOURS AS NEEDED FOR NAUSEA AND/OR VOMITING 30 tablet 1   venlafaxine XR (EFFEXOR-XR) 37.5 MG 24 hr capsule TAKE 1 CAPSULE BY MOUTH DAILY WITH BREAKFAST 90 capsule 3   No current facility-administered medications for this visit.   Facility-Administered Medications Ordered in Other Visits  Medication Dose Route Frequency Provider Last Rate Last Admin   sodium chloride flush (NS) 0.9 % injection 10 mL  10 mL Intravenous  PRN Heath Lark, MD   10 mL at 02/11/17 0839   sodium chloride flush (NS) 0.9 % injection 10 mL  10 mL Intravenous PRN Heath Lark, MD   10 mL at 03/04/17 1510    SUMMARY OF ONCOLOGIC HISTORY: Oncology History Overview Note  Neg genetics   Adenocarcinoma of right fallopian tube ([place])  08/12/2016 Initial Diagnosis   The patient has many months of vague symptoms of fullness in the pelvis, urinary frequency and difficulty with defecation. She denies vaginal bleeding or rectal bleeding.     08/12/2016 Imaging   Abdomen X-ray in the ER: Moderate colonic stool burden without evidence of enteric obstruction   11/13/2016 Imaging   TVUS was performed on 11/13/16 which showed a large hypoechoic lobular mass seen posterior to the uterus measuring 18.2x16.1x11.8cm, blood flow was seen along the periphery of the mass. It was considered to be likely to be a fibroid vs pelvic mass vs ovarian mass. Neither ovary was removed. Moderate amount of free fluid in the pelvis. THe uterus measures 4x10x4cm. The endometrium is 73m.    12/05/2016 Imaging   MR pelvis 1. Mild limitations as detailed above. 2. Heterogeneous pelvic mass is favored to arise from the right ovary. Favor a solid ovarian neoplasm such as fibroma/Brenner's tumor. Given lesion size and heterogeneous T2 signal out, complicating torsion cannot be excluded. 3. Moderate abdominopelvic ascites, without specific evidence of peritoneal metastasis. Consider further evaluation with contrast-enhanced abdominopelvic CT.    12/26/2016 Pathology Results   1. Uterus, ovaries and fallopian tubes - ADENOCARCINOMA INVOLVING RIGHT FALLOPIAN TUBE, RIGHT AND LEFT OVARIES, UTERINE SEROSA AND PELVIC MASS. - SEE ONCOLOGY TABLE AND COMMENT. - UTERINE CERVIX, ENDOMETRIUM, MYOMETRIUM AND LEFT FALLOPIAN TUBE FREE OF TUMOR. 2. Omentum, resection for tumor - ADENOCARCINOMA. Microscopic Comment 1. ONCOLOGY TABLE - FALLOPIAN TUBE. SEE COMMENT 1. Specimen, including laterality: Uterus, bilateral adnexa, pelvic mass and omentum. 2. Procedure: Hysterectomy with bilateral salpingo-oophorectomy,  pelvic mass excision and omentectomy. 3. Lymph node sampling performed: No 4. Tumor site: See comment. 5. Tumor location in fallopian tube: Right fallopian tube fimbria 6. Specimen integrity (intact/ruptured/disrupted): Intact 7. Tumor size (cm): 18 cm, see comment. 8. Histologic type: Adenocarcinoma 9. Grade: High grade 10. Microscopic tumor extension: Tumor involves right fallopian tube, right and left ovaries, uterine serosa, pelvic mass and omentum. 11. Margins: See comment. 12. Lymph-Vascular invasion: Present 13. Lymph nodes: # examined: 0; # positive: N/A 14. TNM: pT3c, pNX 15. FIGO Stage (based on pathologic findings, needs clinical correlation: III-C 16. Comments: There is an 18 cm pelvic mass which is a high grade adenocarcinoma and there is a 12.2 cm  segment of omentum which is extensively involved with adenocarcinoma. The tumor also involves the fimbria of the right fallopian tube and the parenchyma of the right and left ovaries as well as uterine serosa. The fimbria of the right fallopian has intraepithelial atypia consistent with a precursor lesion and therefore this is most consistent with primary fallopian tube adenocarcinoma. A primary peritoneal serous carcinoma is a less likely possibility.    12/26/2016 Pathology Results   PERITONEAL/ASCITIC FLUID(SPECIMEN 1 OF 1 COLLECTED 12/26/16): POORLY DIFFERENTIATED ADENOCARCINOMA   12/26/2016 Surgery   Procedure(s) Performed: Exploratory laparotomy with total abdominal hysterectomy, bilateral salpingo-oophorectomy, omentectomy radical tumor debulking for ovarian cancer .   Surgeon: EThereasa Solo MD.      Operative Findings: 20cm left ovarian mass densely adherent to the posterior uterus, right tube and ovary, cervix, sigmoid colon. 10cm omental cake. 4L ascites.  657m size tumor nodules on the serosa of the terminal ileum and proximal sigmoid colon. 174mnodules on right diaphragm.    This represented an optimal cytoreduction (R1) with  57m72modules on intestine and diaphragm representing gross visible disease   01/16/2017 Procedure   Placement of single lumen port a cath via right internal jugular vein. The catheter tip lies at the cavo-atrial junction. A power injectable port a cath was placed and is ready for immediate use.   01/17/2017 Imaging   1. 3.5 x 7.2 x 4.6 cm fluid collection along the vaginal cuff, posterior the bladder and extending into the right adnexal space. This lesion demonstrates rim enhancement. Imaging features could be related to a loculated postoperative seroma or hematoma. Superinfection cannot be excluded by CT. Given debulking surgery was 3 weeks ago, this entire structure is un likely to represent neoplasm, but peritoneal involvement could have this appearance. 2. Small fluid collection in the left para colic gutter without rim enhancement. 3. Irregular/nodular appearance of the peritoneal M in the anatomic pelvis with areas of subtle nodularity between the stomach and the spleen. Close attention in these regions on follow-up recommended as metastatic disease is a concern. 4. Mildly enlarged hepatoduodenal ligament lymph node. Attention on follow-up recommended.   01/20/2017 Tumor Marker   Patient's tumor was tested for the following markers: CA-125 Results of the tumor marker test revealed 46.6   01/28/2017 Genetic Testing       Negative genetic testing on the [facility].  The [facility] panel offered by [employer] sequencing and deletion/duplication testing of the following 28 genes: APC, ATM, BARD1, BMPR1A, BRCA1, BRCA2, BRIP1, CHD1, CDK4, CDKN2A, CHEK2, EPCAM (large rearrangement only), MLH1, MSH2, MSH6, MUTYH, NBN, PALB2, PMS2, PTEN, RAD51C, RAD51D, SMAD4, STK11, and TP53. Sequencing was performed for select regions of POLE and POLD1, and large rearrangement analysis was performed for select regions of GREM1. The report date is January 28, 2017.  HRD testing looking  for genomic instability and BRCA mutations was negative.  The report date of this test is January 27, 2017.    01/31/2017 Tumor Marker   Patient's tumor was tested for the following markers: CA-125 Results of the tumor marker test revealed 22.4   03/04/2017 Tumor Marker   Patient's tumor was tested for the following markers: CA-125 Results of the tumor marker test revealed 13.8   04/18/2017 Tumor Marker   Patient's tumor was tested for the following markers: CA-125 Results of the tumor marker test revealed 11.9   05/05/2017 Tumor Marker   Patient's tumor was tested for the following markers: CA-125 Results of the tumor marker test revealed 12   05/26/2017 Tumor Marker   Patient's tumor was tested for the following markers: CA-125 Results of the tumor marker test revealed 10.6   05/26/2017 Imaging   1. A previously noted postoperative fluid collection in the low pelvis and residual ascites seen on the prior examination has resolved on today's study. No findings to suggest residual/recurrent disease on today's examination. No definite solid organ metastasis identified in the abdomen or pelvis. No lymphadenopathy. 2. Aortic atherosclerosis.   06/10/2017 Procedure   Successful right IJ vein Port-A-Cath explant.   03/09/2018 Tumor Marker   Patient's tumor was tested for the following markers: CA-125 Results of the tumor marker test revealed 12.1   05/26/2018 Tumor Marker   Patient's tumor was tested for the following markers: CA-125 Results of the tumor marker test revealed 13.8   09/09/2018 Tumor  Marker   Patient's tumor was tested for the following markers: CA-125 Results of the tumor marker test revealed 23.9   12/18/2018 Tumor Marker   Patient's tumor was tested for the following markers: CA-125 Results of the tumor marker test revealed 43.8   02/27/2019 - 02/28/2019 Hospital Admission   She was admitted for bowel obstruction   02/27/2019 Imaging   1. High-grade small bowel  obstruction with transition point in the pelvis in an area of suspected desmoplastic reaction surrounding a 1.3 cm spiculated nodule in the mesentery, suspicious for carcinoid. There is hyperenhancement near the tip of the appendix and loss of the normal fat plane between it and the adjacent sigmoid colon, potentially the site of primary lesion. 2. Multiple new small subcentimeter hyperdense lesions scattered along the liver capsule, concerning for metastases. Enlarged hyperenhancing gastrohepatic and portacaval lymph nodes concerning for nodal metastases. 3. Very mild right hydroureteronephrosis to the level of the desmoplastic reaction in the pelvis, potentially involving the right ureter. 4. Infraumbilical ventral abdominal wall diastasis containing a small nondilated portion of transverse colon. 5. Trace ascites. 6. Cholelithiasis. 7.  Aortic atherosclerosis (ICD10-I70.0).     03/05/2019 Tumor Marker   Patient's tumor was tested for the following markers: CA-125 Results of the tumor marker test revealed 70.1   03/12/2019 Echocardiogram   IMPRESSIONS     1. Left ventricular ejection fraction, by visual estimation, is 60 to 65%. The left ventricle has normal function. There is no left ventricular hypertrophy.  2. Left ventricular diastolic parameters are consistent with Grade I diastolic dysfunction (impaired relaxation).  3. Global right ventricle has normal systolic function.The right ventricular size is normal. No increase in right ventricular wall thickness.  4. Left atrial size was normal.  5. Right atrial size was normal.  6. The pericardium was not assessed.  7. The mitral valve is normal in structure. No evidence of mitral valve regurgitation.  8. The tricuspid valve is normal in structure. Tricuspid valve regurgitation is trivial.  9. The aortic valve is normal in structure. Aortic valve regurgitation is not visualized. 10. The pulmonic valve was grossly normal. Pulmonic valve  regurgitation is not visualized. 11. The average left ventricular global longitudinal strain is -17.6 %.   03/16/2019 Procedure   Successful placement of a right internal jugular approach power injectable Port-A-Cath. The catheter is ready for immediate use.     04/19/2019 Tumor Marker   Patient's tumor was tested for the following markers: CA-125 Results of the tumor marker test revealed 39.8   05/17/2019 Tumor Marker   Patient's tumor was tested for the following markers: CA-125 Results of the tumor marker test revealed 27   05/28/2019 Tumor Marker   Patient's tumor was tested for the following markers: CA-125 Results of the tumor marker test revealed 27.7.   06/11/2019 Imaging   1. No new or progressive metastatic disease in the abdomen or pelvis. 2. Tiny noncalcified perisplenic implant is decreased. Calcified pericardiophrenic, retroperitoneal and bilateral inguinal lymph nodes and scattered small calcified perihepatic and perisplenic implants are stable. 3.  Aortic Atherosclerosis (ICD10-I70.0).       06/17/2019 Echocardiogram    1. Left ventricular ejection fraction, by estimation, is 65 to 70%. The left ventricle has normal function. The left ventricle has no regional wall motion abnormalities. Left ventricular diastolic parameters were normal.  2. Right ventricular systolic function is normal. The right ventricular size is normal.  3. The mitral valve is normal in structure and function. No evidence of mitral  valve regurgitation. No evidence of mitral stenosis.  4. The aortic valve is normal in structure and function. Aortic valve regurgitation is not visualized. No aortic stenosis is present.   06/21/2019 Tumor Marker   Patient's tumor was tested for the following markers: CA-125 Results of the tumor marker test revealed 18.7   07/19/2019 Tumor Marker   Patient's tumor was tested for the following markers: CA-125 Results of the tumor marker test revealed 16.7   08/30/2019  Tumor Marker   Patient's tumor was tested for the following markers: CA-125. Results of the tumor marker test revealed 13.9   09/15/2019 Echocardiogram    1. Left ventricular ejection fraction, by estimation, is 65 to 70%. The left ventricle has normal function. The left ventricle has no regional wall motion abnormalities. Left ventricular diastolic parameters are consistent with Grade I diastolic dysfunction (impaired relaxation). The average left ventricular global longitudinal strain is 18.1 %. The global longitudinal strain is normal.  2. Right ventricular systolic function is normal. The right ventricular size is normal.  3. The mitral valve is normal in structure. No evidence of mitral valve regurgitation. No evidence of mitral stenosis.  4. The aortic valve is normal in structure. Aortic valve regurgitation is not visualized. No aortic stenosis is present.  5. The inferior vena cava is normal in size with greater than 50% respiratory variability, suggesting right atrial pressure of 3 mmHg.     09/24/2019 Imaging   1. Stable exam. No new or progressive metastatic disease on today's study. 2. Small subcapsular hypodensity in the lateral spleen, new in the interval, but indeterminate. Attention on follow-up recommended. 3. The calcified upper abdominal and groin lymph nodes are stable as are the scattered calcified and noncalcified peritoneal implants. 4. Stable midline ventral hernia containing a short segment of colon without complicating features. 5. Aortic Atherosclerosis (ICD10-I70.0).     10/04/2019 -  Chemotherapy    Patient is on Treatment Plan: OVARIAN BEVACIZUMAB Q21D       10/04/2019 Tumor Marker   Patient's tumor was tested for the following markers: CA-125. Results of the tumor marker test revealed 12.9.   11/01/2019 Tumor Marker   Patient's tumor was tested for the following markers: CA-125 Results of the tumor marker test revealed 15.4   12/13/2019 Tumor Marker    Patient's tumor was tested for the following markers: CA-125 Results of the tumor marker test revealed 14.8   12/31/2019 Imaging   1. Unchanged tiny peritoneal nodule adjacent to the spleen measuring 6 mm. Other previously noted peritoneal nodules are imperceptible on present examination. 2. Unchanged prominent, partially calcified portacaval lymph node and bilateral inguinal lymph nodes. 3. Unchanged subtle, nonspecific subcapsular lesion of the spleen.  4. No evidence of new metastatic disease in the abdomen or pelvis. 5. Status post hysterectomy. 6. Unchanged low midline ventral abdominal hernia containing a single loop of nonobstructed transverse colon. 7. Cholelithiasis. 8. Aortic Atherosclerosis (ICD10-I70.0).       01/03/2020 Tumor Marker   Patient's tumor was tested for the following markers: CA-125 Results of the tumor marker test revealed 11.7.   01/24/2020 Tumor Marker   Patient's tumor was tested for the following markers: CA-125. Results of the tumor marker test revealed 15.1   03/06/2020 Tumor Marker   Patient's tumor was tested for the following markers: CA-125. Results of the tumor marker test revealed 20.3   03/27/2020 Tumor Marker   Patient's tumor was tested for the following markers: CA-125 Results of the tumor marker test revealed  23.7   05/11/2020 Tumor Marker   Patient's tumor was tested for the following markers: CA-125. Results of the tumor marker test revealed 25.4   06/23/2020 Imaging   1. Cholelithiasis with gallbladder wall thickening and mild infiltrative edema in the porta hepatis, raising the possibility of acute cholecystitis. There is truncation of the common bile duct distally and distal choledocholithiasis is difficult to exclude. Correlate with bilirubin levels. If clinically warranted, MRCP could be utilized for further characterization. 2. Stable tiny perisplenic nodule. 3. Other imaging findings of potential clinical significance: Small type 1  hiatal hernia. Prominent stool throughout the colon favors constipation. Ventral infraumbilical hernia contains transverse colon without findings of strangulation or obstruction. Small supraumbilical hernia contains adipose tissue. Lumbar degenerative disc disease at L5-S1. Stable tiny nodule along the lateral margin of the spleen, nonspecific. 4. Aortic atherosclerosis.     12/15/2020 Imaging   No evidence of recurrent or metastatic carcinoma within the abdomen or pelvis.   Stable ventral hernia containing transverse colon. No evidence of bowel obstruction or strangulation.   Cholelithiasis. No radiographic evidence of cholecystitis.   Tiny hiatal hernia.   Aortic Atherosclerosis (ICD10-I70.0).       PHYSICAL EXAMINATION: ECOG PERFORMANCE STATUS: 0 - Asymptomatic  Vitals:   01/08/21 1023  BP: (!) 156/70  Pulse: 79  Resp: 18  SpO2: 100%   Filed Weights   01/08/21 1023  Weight: 166 lb (75.3 kg)    GENERAL:alert, no distress and comfortable SKIN: skin color, texture, turgor are normal, no rashes or significant lesions EYES: normal, Conjunctiva are pink and non-injected, sclera clear OROPHARYNX:no exudate, no erythema and lips, buccal mucosa, and tongue normal  NECK: supple, thyroid normal size, non-tender, without nodularity LYMPH:  no palpable lymphadenopathy in the cervical, axillary or inguinal LUNGS: clear to auscultation and percussion with normal breathing effort HEART: regular rate & rhythm and no murmurs and no lower extremity edema ABDOMEN:abdomen soft, non-tender and normal bowel sounds.  Noted well-healed surgical scar Musculoskeletal:no cyanosis of digits and no clubbing  NEURO: alert & oriented x 3 with fluent speech, no focal motor/sensory deficits  LABORATORY DATA:  I have reviewed the data as listed    Component Value Date/Time   NA 139 12/15/2020 0810   NA 139 04/14/2017 0742   K 4.9 12/15/2020 0810   K 4.0 04/14/2017 0742   CL 105 12/15/2020 0810    CO2 24 12/15/2020 0810   CO2 21 (L) 04/14/2017 0742   GLUCOSE 93 12/15/2020 0810   GLUCOSE 247 (H) 04/14/2017 0742   BUN 16 12/15/2020 0810   BUN 13.1 04/14/2017 0742   CREATININE 1.09 (H) 12/15/2020 0810   CREATININE 0.97 04/17/2020 0812   CREATININE 0.9 04/14/2017 0742   CALCIUM 9.5 12/15/2020 0810   CALCIUM 9.2 04/14/2017 0742   PROT 7.0 12/15/2020 0810   PROT 7.3 04/14/2017 0742   ALBUMIN 3.7 12/15/2020 0810   ALBUMIN 3.9 04/14/2017 0742   AST 18 12/15/2020 0810   AST 18 04/17/2020 0812   AST 17 04/14/2017 0742   ALT 11 12/15/2020 0810   ALT 11 04/17/2020 0812   ALT 14 04/14/2017 0742   ALKPHOS 64 12/15/2020 0810   ALKPHOS 70 04/14/2017 0742   BILITOT 0.4 12/15/2020 0810   BILITOT 0.3 04/17/2020 0812   BILITOT 0.37 04/14/2017 0742   GFRNONAA 56 (L) 12/15/2020 0810   GFRNONAA >60 04/17/2020 0812   GFRAA >60 01/24/2020 0924   GFRAA >60 07/19/2019 0951    No results found for:  SPEP, UPEP  Lab Results  Component Value Date   WBC 6.9 01/08/2021   NEUTROABS 4.1 01/08/2021   HGB 13.2 01/08/2021   HCT 40.9 01/08/2021   MCV 97.6 01/08/2021   PLT 178 01/08/2021      Chemistry      Component Value Date/Time   NA 139 12/15/2020 0810   NA 139 04/14/2017 0742   K 4.9 12/15/2020 0810   K 4.0 04/14/2017 0742   CL 105 12/15/2020 0810   CO2 24 12/15/2020 0810   CO2 21 (L) 04/14/2017 0742   BUN 16 12/15/2020 0810   BUN 13.1 04/14/2017 0742   CREATININE 1.09 (H) 12/15/2020 0810   CREATININE 0.97 04/17/2020 0812   CREATININE 0.9 04/14/2017 0742      Component Value Date/Time   CALCIUM 9.5 12/15/2020 0810   CALCIUM 9.2 04/14/2017 0742   ALKPHOS 64 12/15/2020 0810   ALKPHOS 70 04/14/2017 0742   AST 18 12/15/2020 0810   AST 18 04/17/2020 0812   AST 17 04/14/2017 0742   ALT 11 12/15/2020 0810   ALT 11 04/17/2020 0812   ALT 14 04/14/2017 0742   BILITOT 0.4 12/15/2020 0810   BILITOT 0.3 04/17/2020 0812   BILITOT 0.37 04/14/2017 9728

## 2021-01-08 NOTE — Assessment & Plan Note (Signed)
Her blood pressure is better controlled with the addition of lisinopril She has no other symptoms We will proceed with treatment as scheduled She has no evidence of disease on CT imaging The role of treatment moving forward is of maintenance in nature I recommend spacing out interval between CT imaging Her next imaging study would be in February 2023

## 2021-01-08 NOTE — Patient Instructions (Signed)
[facility]  Discharge Instructions: Thank you for choosing [facility] to provide your oncology and hematology care.   If you have a lab appointment with the Cancer Center, please go directly to the Cancer Center and check in at the registration area.   Wear comfortable clothing and clothing appropriate for easy access to any Portacath or PICC line.   We strive to give you quality time with your provider. You may need to reschedule your appointment if you arrive late (15 or more minutes).  Arriving late affects you and other patients whose appointments are after yours.  Also, if you miss three or more appointments without notifying the office, you may be dismissed from the clinic at the provider's discretion.      For prescription refill requests, have your pharmacy contact our office and allow 72 hours for refills to be completed.    Today you received the following chemotherapy and/or immunotherapy agents [id number]      To help prevent nausea and vomiting after your treatment, we encourage you to take your nausea medication as directed.  BELOW ARE SYMPTOMS THAT SHOULD BE REPORTED IMMEDIATELY: *FEVER GREATER THAN 100.4 F (38 C) OR HIGHER *CHILLS OR SWEATING *NAUSEA AND VOMITING THAT IS NOT CONTROLLED WITH YOUR NAUSEA MEDICATION *UNUSUAL SHORTNESS OF BREATH *UNUSUAL BRUISING OR BLEEDING *URINARY PROBLEMS (pain or burning when urinating, or frequent urination) *BOWEL PROBLEMS (unusual diarrhea, constipation, pain near the anus) TENDERNESS IN MOUTH AND THROAT WITH OR WITHOUT PRESENCE OF ULCERS (sore throat, sores in mouth, or a toothache) UNUSUAL RASH, SWELLING OR PAIN  UNUSUAL VAGINAL DISCHARGE OR ITCHING   Items with * indicate a potential emergency and should be followed up as soon as possible or go to the Emergency Department if any problems should occur.  Please show the CHEMOTHERAPY ALERT CARD or IMMUNOTHERAPY ALERT CARD at  check-in to the Emergency Department and triage nurse.  Should you have questions after your visit or need to cancel or reschedule your appointment, please contact [facility]  Dept: [phone]  and follow the prompts.  Office hours are 8:00 a.m. to 4:30 p.m. Monday - Friday. Please note that voicemails left after 4:00 p.m. may not be returned until the following business day.  We are closed weekends and major holidays. You have access to a nurse at all times for urgent questions. Please call the main number to the clinic Dept: [phone] and follow the prompts.   For any non-urgent questions, you may also contact your provider using MyChart. We now offer e-Visits for anyone 18 and older to request care online for non-urgent symptoms. For details visit [email or web address].   Also download the MyChart app! Go to the app store, search "MyChart", open the app, select [facility], and log in with your MyChart username and password.  Due to Covid, a mask is required upon entering the hospital/clinic. If you do not have a mask, one will be given to you upon arrival. For doctor visits, patients may have 1 support person aged 18 or older with them. For treatment visits, patients cannot have anyone with them due to current Covid guidelines and our immunocompromised population.   

## 2021-01-08 NOTE — Assessment & Plan Note (Signed)
She has intermittent elevated serum creatinine Observe closely for now I recommend increase fluid hydration as tolerated

## 2021-01-08 NOTE — Assessment & Plan Note (Signed)
Her blood pressure is well controlled We discussed importance of risk factor modification and lifestyle changes

## 2021-01-29 ENCOUNTER — Inpatient Hospital Stay: Payer: Medicare Other | Attending: Hematology and Oncology

## 2021-01-29 ENCOUNTER — Other Ambulatory Visit: Payer: Self-pay

## 2021-01-29 ENCOUNTER — Inpatient Hospital Stay: Payer: Medicare Other

## 2021-01-29 ENCOUNTER — Encounter: Payer: Self-pay | Admitting: Hematology and Oncology

## 2021-01-29 ENCOUNTER — Inpatient Hospital Stay (HOSPITAL_BASED_OUTPATIENT_CLINIC_OR_DEPARTMENT_OTHER): Payer: Medicare Other | Admitting: Hematology and Oncology

## 2021-01-29 DIAGNOSIS — C786 Secondary malignant neoplasm of retroperitoneum and peritoneum: Secondary | ICD-10-CM | POA: Insufficient documentation

## 2021-01-29 DIAGNOSIS — I10 Essential (primary) hypertension: Secondary | ICD-10-CM

## 2021-01-29 DIAGNOSIS — Z7189 Other specified counseling: Secondary | ICD-10-CM

## 2021-01-29 DIAGNOSIS — C5701 Malignant neoplasm of right fallopian tube: Secondary | ICD-10-CM | POA: Insufficient documentation

## 2021-01-29 DIAGNOSIS — R7989 Other specified abnormal findings of blood chemistry: Secondary | ICD-10-CM | POA: Diagnosis not present

## 2021-01-29 DIAGNOSIS — C563 Malignant neoplasm of bilateral ovaries: Secondary | ICD-10-CM | POA: Diagnosis not present

## 2021-01-29 DIAGNOSIS — Z5112 Encounter for antineoplastic immunotherapy: Secondary | ICD-10-CM | POA: Diagnosis present

## 2021-01-29 DIAGNOSIS — Z79899 Other long term (current) drug therapy: Secondary | ICD-10-CM | POA: Diagnosis not present

## 2021-01-29 LAB — CBC WITH DIFFERENTIAL/PLATELET
Abs Immature Granulocytes: 0.02 10*3/uL (ref 0.00–0.07)
Basophils Absolute: 0.1 10*3/uL (ref 0.0–0.1)
Basophils Relative: 1 %
Eosinophils Absolute: 0.6 10*3/uL — ABNORMAL HIGH (ref 0.0–0.5)
Eosinophils Relative: 10 %
HCT: 37.9 % (ref 36.0–46.0)
Hemoglobin: 12.3 g/dL (ref 12.0–15.0)
Immature Granulocytes: 0 %
Lymphocytes Relative: 22 %
Lymphs Abs: 1.3 10*3/uL (ref 0.7–4.0)
MCH: 31.4 pg (ref 26.0–34.0)
MCHC: 32.5 g/dL (ref 30.0–36.0)
MCV: 96.7 fL (ref 80.0–100.0)
Monocytes Absolute: 0.6 10*3/uL (ref 0.1–1.0)
Monocytes Relative: 9 %
Neutro Abs: 3.5 10*3/uL (ref 1.7–7.7)
Neutrophils Relative %: 58 %
Platelets: 180 10*3/uL (ref 150–400)
RBC: 3.92 MIL/uL (ref 3.87–5.11)
RDW: 13.6 % (ref 11.5–15.5)
WBC: 6.1 10*3/uL (ref 4.0–10.5)
nRBC: 0 % (ref 0.0–0.2)

## 2021-01-29 LAB — COMPREHENSIVE METABOLIC PANEL
ALT: 8 U/L (ref 0–44)
AST: 14 U/L — ABNORMAL LOW (ref 15–41)
Albumin: 3.4 g/dL — ABNORMAL LOW (ref 3.5–5.0)
Alkaline Phosphatase: 56 U/L (ref 38–126)
Anion gap: 8 (ref 5–15)
BUN: 19 mg/dL (ref 8–23)
CO2: 24 mmol/L (ref 22–32)
Calcium: 9.1 mg/dL (ref 8.9–10.3)
Chloride: 107 mmol/L (ref 98–111)
Creatinine, Ser: 1.17 mg/dL — ABNORMAL HIGH (ref 0.44–1.00)
GFR, Estimated: 51 mL/min — ABNORMAL LOW (ref >60)
Glucose, Bld: 115 mg/dL — ABNORMAL HIGH (ref 70–99)
Potassium: 4.2 mmol/L (ref 3.5–5.1)
Sodium: 139 mmol/L (ref 135–145)
Total Bilirubin: 0.5 mg/dL (ref 0.3–1.2)
Total Protein: 6.5 g/dL (ref 6.5–8.1)

## 2021-01-29 LAB — TOTAL PROTEIN, URINE DIPSTICK: Protein, ur: NEGATIVE mg/dL

## 2021-01-29 MED ORDER — SODIUM CHLORIDE 0.9% FLUSH
10.0000 mL | Freq: Once | INTRAVENOUS | Status: AC
  Administered 2021-01-29: 10 mL

## 2021-01-29 MED ORDER — SODIUM CHLORIDE 0.9 % IV SOLN
800.0000 mg | Freq: Once | INTRAVENOUS | Status: AC
  Administered 2021-01-29: 800 mg via INTRAVENOUS
  Filled 2021-01-29: qty 32

## 2021-01-29 MED ORDER — HEPARIN SOD (PORK) LOCK FLUSH 100 UNIT/ML IV SOLN
500.0000 [IU] | Freq: Once | INTRAVENOUS | Status: AC | PRN
  Administered 2021-01-29: 500 [IU]

## 2021-01-29 MED ORDER — SODIUM CHLORIDE 0.9 % IV SOLN: Freq: Once | INTRAVENOUS | Status: AC

## 2021-01-29 MED ORDER — SODIUM CHLORIDE 0.9% FLUSH
10.0000 mL | INTRAVENOUS | Status: DC | PRN
  Administered 2021-01-29: 10 mL

## 2021-01-29 NOTE — Assessment & Plan Note (Signed)
Her blood pressure is well controlled We discussed importance of risk factor modification and lifestyle changes

## 2021-01-29 NOTE — Progress Notes (Signed)
[place] OFFICE PROGRESS NOTE  Patient Care Team: Gaynelle Arabian, MD as PCP - General (Family Medicine) Heath Lark, MD as Consulting Physician (Hematology and Oncology) Everitt Amber, MD as Consulting Physician (Obstetrics and Gynecology)  ASSESSMENT & PLAN:  Adenocarcinoma of right fallopian tube [facility]) Her blood pressure is better controlled with the addition of lisinopril She has no other symptoms We will proceed with treatment as scheduled She has no evidence of disease on CT imaging The role of treatment moving forward is of maintenance in nature I recommend spacing out interval between CT imaging Her next imaging study would be in February 2023  Essential hypertension Her blood pressure is well controlled We discussed importance of risk factor modification and lifestyle changes  Elevated serum creatinine She has intermittent elevated serum creatinine Observe closely for now I recommend increase fluid hydration as tolerated  No orders of the defined types were placed in this encounter.   All questions were answered. The patient knows to call the clinic with any problems, questions or concerns. The total time spent in the appointment was 20 minutes encounter with patients including review of chart and various tests results, discussions about plan of care and coordination of care plan   Heath Lark, MD 01/29/2021 10:53 AM  INTERVAL HISTORY: Please see below for problem oriented charting. she returns for treatment follow-up on bevacizumab for recurrent fallopian tube cancer She is doing well Even though her blood pressure is elevated today but her documented blood pressure from home were within normal range She denies abdominal pain, changes in bowel habits or bloating  REVIEW OF SYSTEMS:   Constitutional: Denies fevers, chills or abnormal weight loss Eyes: Denies blurriness of vision Ears, nose, mouth, throat, and face: Denies mucositis or sore  throat Respiratory: Denies cough, dyspnea or wheezes Cardiovascular: Denies palpitation, chest discomfort or lower extremity swelling Gastrointestinal:  Denies nausea, heartburn or change in bowel habits Skin: Denies abnormal skin rashes Lymphatics: Denies new lymphadenopathy or easy bruising Neurological:Denies numbness, tingling or new weaknesses Behavioral/Psych: Mood is stable, no new changes  All other systems were reviewed with the patient and are negative.  I have reviewed the past medical history, past surgical history, social history and family history with the patient and they are unchanged from previous note.  ALLERGIES:  is allergic to dilaudid [hydromorphone hcl], morphine and related, and sudafed [pseudoephedrine hcl].  MEDICATIONS:  Current Outpatient Medications  Medication Sig Dispense Refill   acetaminophen (TYLENOL) 325 MG tablet Take 975 mg by mouth every 6 (six) hours as needed (pain).     amLODipine (NORVASC) 10 MG tablet TAKE 1 TABLET(10 MG) BY MOUTH DAILY 30 tablet 9   lidocaine-prilocaine (EMLA) cream Apply to affected area once daily as directed. 30 g 3   lisinopril (ZESTRIL) 20 MG tablet TAKE 1 TABLET(20 MG) BY MOUTH DAILY 90 tablet 3   ondansetron (ZOFRAN) 8 MG tablet Take 1 tablet (8 mg total) by mouth every 8 (eight) hours as needed. 30 tablet 1   prochlorperazine (COMPAZINE) 10 MG tablet TAKE 1 TABLET BY MOUTH EVERY 6 HOURS AS NEEDED FOR NAUSEA AND/OR VOMITING 30 tablet 1   venlafaxine XR (EFFEXOR-XR) 37.5 MG 24 hr capsule TAKE 1 CAPSULE BY MOUTH DAILY WITH BREAKFAST 90 capsule 3   No current facility-administered medications for this visit.   Facility-Administered Medications Ordered in Other Visits  Medication Dose Route Frequency Provider Last Rate Last Admin   sodium chloride flush (NS) 0.9 % injection 10 mL  10  mL Intravenous PRN Heath Lark, MD   10 mL at 02/11/17 0839   sodium chloride flush (NS) 0.9 % injection 10 mL  10 mL Intravenous PRN Alvy Bimler,  Deshondra Worst, MD   10 mL at 03/04/17 1510   sodium chloride flush (NS) 0.9 % injection 10 mL  10 mL Intracatheter PRN Alvy Bimler, Ayron Fillinger, MD   10 mL at 01/29/21 1013    SUMMARY OF ONCOLOGIC HISTORY: Oncology History Overview Note  Neg genetics   Adenocarcinoma of right fallopian tube ([place])  08/12/2016 Initial Diagnosis   The patient has many months of vague symptoms of fullness in the pelvis, urinary frequency and difficulty with defecation. She denies vaginal bleeding or rectal bleeding.     08/12/2016 Imaging   Abdomen X-ray in the ER: Moderate colonic stool burden without evidence of enteric obstruction   11/13/2016 Imaging   TVUS was performed on 11/13/16 which showed a large hypoechoic lobular mass seen posterior to the uterus measuring 18.2x16.1x11.8cm, blood flow was seen along the periphery of the mass. It was considered to be likely to be a fibroid vs pelvic mass vs ovarian mass. Neither ovary was removed. Moderate amount of free fluid in the pelvis. THe uterus measures 4x10x4cm. The endometrium is 70m.    12/05/2016 Imaging   MR pelvis 1. Mild limitations as detailed above. 2. Heterogeneous pelvic mass is favored to arise from the right ovary. Favor a solid ovarian neoplasm such as fibroma/Brenner's tumor. Given lesion size and heterogeneous T2 signal out, complicating torsion cannot be excluded. 3. Moderate abdominopelvic ascites, without specific evidence of peritoneal metastasis. Consider further evaluation with contrast-enhanced abdominopelvic CT.    12/26/2016 Pathology Results   1. Uterus, ovaries and fallopian tubes - ADENOCARCINOMA INVOLVING RIGHT FALLOPIAN TUBE, RIGHT AND LEFT OVARIES, UTERINE SEROSA AND PELVIC MASS. - SEE ONCOLOGY TABLE AND COMMENT. - UTERINE CERVIX, ENDOMETRIUM, MYOMETRIUM AND LEFT FALLOPIAN TUBE FREE OF TUMOR. 2. Omentum, resection for tumor - ADENOCARCINOMA. Microscopic Comment 1. ONCOLOGY TABLE - FALLOPIAN TUBE. SEE COMMENT 1. Specimen, including laterality:  Uterus, bilateral adnexa, pelvic mass and omentum. 2. Procedure: Hysterectomy with bilateral salpingo-oophorectomy, pelvic mass excision and omentectomy. 3. Lymph node sampling performed: No 4. Tumor site: See comment. 5. Tumor location in fallopian tube: Right fallopian tube fimbria 6. Specimen integrity (intact/ruptured/disrupted): Intact 7. Tumor size (cm): 18 cm, see comment. 8. Histologic type: Adenocarcinoma 9. Grade: High grade 10. Microscopic tumor extension: Tumor involves right fallopian tube, right and left ovaries, uterine serosa, pelvic mass and omentum. 11. Margins: See comment. 12. Lymph-Vascular invasion: Present 13. Lymph nodes: # examined: 0; # positive: N/A 14. TNM: pT3c, pNX 15. FIGO Stage (based on pathologic findings, needs clinical correlation: III-C 16. Comments: There is an 18 cm pelvic mass which is a high grade adenocarcinoma and there is a 12.2 cm  segment of omentum which is extensively involved with adenocarcinoma. The tumor also involves the fimbria of the right fallopian tube and the parenchyma of the right and left ovaries as well as uterine serosa. The fimbria of the right fallopian has intraepithelial atypia consistent with a precursor lesion and therefore this is most consistent with primary fallopian tube adenocarcinoma. A primary peritoneal serous carcinoma is a less likely possibility.    12/26/2016 Pathology Results   PERITONEAL/ASCITIC FLUID(SPECIMEN 1 OF 1 COLLECTED 12/26/16): POORLY DIFFERENTIATED ADENOCARCINOMA   12/26/2016 Surgery   Procedure(s) Performed: Exploratory laparotomy with total abdominal hysterectomy, bilateral salpingo-oophorectomy, omentectomy radical tumor debulking for ovarian cancer .   Surgeon: EThereasa Solo MD.  Operative Findings: 20cm left ovarian mass densely adherent to the posterior uterus, right tube and ovary, cervix, sigmoid colon. 10cm omental cake. 4L ascites. 77m size tumor nodules on the serosa of the terminal ileum  and proximal sigmoid colon. 117mnodules on right diaphragm.    This represented an optimal cytoreduction (R1) with 47m32modules on intestine and diaphragm representing gross visible disease   01/16/2017 Procedure   Placement of single lumen port a cath via right internal jugular vein. The catheter tip lies at the cavo-atrial junction. A power injectable port a cath was placed and is ready for immediate use.   01/17/2017 Imaging   1. 3.5 x 7.2 x 4.6 cm fluid collection along the vaginal cuff, posterior the bladder and extending into the right adnexal space. This lesion demonstrates rim enhancement. Imaging features could be related to a loculated postoperative seroma or hematoma. Superinfection cannot be excluded by CT. Given debulking surgery was 3 weeks ago, this entire structure is un likely to represent neoplasm, but peritoneal involvement could have this appearance. 2. Small fluid collection in the left para colic gutter without rim enhancement. 3. Irregular/nodular appearance of the peritoneal M in the anatomic pelvis with areas of subtle nodularity between the stomach and the spleen. Close attention in these regions on follow-up recommended as metastatic disease is a concern. 4. Mildly enlarged hepatoduodenal ligament lymph node. Attention on follow-up recommended.   01/20/2017 Tumor Marker   Patient's tumor was tested for the following markers: CA-125 Results of the tumor marker test revealed 46.6   01/28/2017 Genetic Testing       Negative genetic testing on the [facility].  The [facility] panel offered by [employer] [employer] sequencing and deletion/duplication testing of the following 28 genes: APC, ATM, BARD1, BMPR1A, BRCA1, BRCA2, BRIP1, CHD1, CDK4, CDKN2A, CHEK2, EPCAM (large rearrangement only), MLH1, MSH2, MSH6, MUTYH, NBN, PALB2, PMS2, PTEN, RAD51C, RAD51D, SMAD4, STK11, and TP53. Sequencing was performed for select regions of POLE and POLD1, and large  rearrangement analysis was performed for select regions of GREM1. The report date is January 28, 2017.  HRD testing looking for genomic instability and BRCA mutations was negative.  The report date of this test is January 27, 2017.    01/31/2017 Tumor Marker   Patient's tumor was tested for the following markers: CA-125 Results of the tumor marker test revealed 22.4   03/04/2017 Tumor Marker   Patient's tumor was tested for the following markers: CA-125 Results of the tumor marker test revealed 13.8   04/18/2017 Tumor Marker   Patient's tumor was tested for the following markers: CA-125 Results of the tumor marker test revealed 11.9   05/05/2017 Tumor Marker   Patient's tumor was tested for the following markers: CA-125 Results of the tumor marker test revealed 12   05/26/2017 Tumor Marker   Patient's tumor was tested for the following markers: CA-125 Results of the tumor marker test revealed 10.6   05/26/2017 Imaging   1. A previously noted postoperative fluid collection in the low pelvis and residual ascites seen on the prior examination has resolved on today's study. No findings to suggest residual/recurrent disease on today's examination. No definite solid organ metastasis identified in the abdomen or pelvis. No lymphadenopathy. 2. Aortic atherosclerosis.   06/10/2017 Procedure   Successful right IJ vein Port-A-Cath explant.   03/09/2018 Tumor Marker   Patient's tumor was tested for the following markers: CA-125 Results of the tumor marker test revealed 12.1   05/26/2018 Tumor  Marker   Patient's tumor was tested for the following markers: CA-125 Results of the tumor marker test revealed 13.8   09/09/2018 Tumor Marker   Patient's tumor was tested for the following markers: CA-125 Results of the tumor marker test revealed 23.9   12/18/2018 Tumor Marker   Patient's tumor was tested for the following markers: CA-125 Results of the tumor marker test revealed 43.8   02/27/2019 -  02/28/2019 Hospital Admission   She was admitted for bowel obstruction   02/27/2019 Imaging   1. High-grade small bowel obstruction with transition point in the pelvis in an area of suspected desmoplastic reaction surrounding a 1.3 cm spiculated nodule in the mesentery, suspicious for carcinoid. There is hyperenhancement near the tip of the appendix and loss of the normal fat plane between it and the adjacent sigmoid colon, potentially the site of primary lesion. 2. Multiple new small subcentimeter hyperdense lesions scattered along the liver capsule, concerning for metastases. Enlarged hyperenhancing gastrohepatic and portacaval lymph nodes concerning for nodal metastases. 3. Very mild right hydroureteronephrosis to the level of the desmoplastic reaction in the pelvis, potentially involving the right ureter. 4. Infraumbilical ventral abdominal wall diastasis containing a small nondilated portion of transverse colon. 5. Trace ascites. 6. Cholelithiasis. 7.  Aortic atherosclerosis (ICD10-I70.0).     03/05/2019 Tumor Marker   Patient's tumor was tested for the following markers: CA-125 Results of the tumor marker test revealed 70.1   03/12/2019 Echocardiogram   IMPRESSIONS     1. Left ventricular ejection fraction, by visual estimation, is 60 to 65%. The left ventricle has normal function. There is no left ventricular hypertrophy.  2. Left ventricular diastolic parameters are consistent with Grade I diastolic dysfunction (impaired relaxation).  3. Global right ventricle has normal systolic function.The right ventricular size is normal. No increase in right ventricular wall thickness.  4. Left atrial size was normal.  5. Right atrial size was normal.  6. The pericardium was not assessed.  7. The mitral valve is normal in structure. No evidence of mitral valve regurgitation.  8. The tricuspid valve is normal in structure. Tricuspid valve regurgitation is trivial.  9. The aortic valve is normal  in structure. Aortic valve regurgitation is not visualized. 10. The pulmonic valve was grossly normal. Pulmonic valve regurgitation is not visualized. 11. The average left ventricular global longitudinal strain is -17.6 %.   03/16/2019 Procedure   Successful placement of a right internal jugular approach power injectable Port-A-Cath. The catheter is ready for immediate use.     04/19/2019 Tumor Marker   Patient's tumor was tested for the following markers: CA-125 Results of the tumor marker test revealed 39.8   05/17/2019 Tumor Marker   Patient's tumor was tested for the following markers: CA-125 Results of the tumor marker test revealed 27   05/28/2019 Tumor Marker   Patient's tumor was tested for the following markers: CA-125 Results of the tumor marker test revealed 27.7.   06/11/2019 Imaging   1. No new or progressive metastatic disease in the abdomen or pelvis. 2. Tiny noncalcified perisplenic implant is decreased. Calcified pericardiophrenic, retroperitoneal and bilateral inguinal lymph nodes and scattered small calcified perihepatic and perisplenic implants are stable. 3.  Aortic Atherosclerosis (ICD10-I70.0).       06/17/2019 Echocardiogram    1. Left ventricular ejection fraction, by estimation, is 65 to 70%. The left ventricle has normal function. The left ventricle has no regional wall motion abnormalities. Left ventricular diastolic parameters were normal.  2. Right ventricular systolic  function is normal. The right ventricular size is normal.  3. The mitral valve is normal in structure and function. No evidence of mitral valve regurgitation. No evidence of mitral stenosis.  4. The aortic valve is normal in structure and function. Aortic valve regurgitation is not visualized. No aortic stenosis is present.   06/21/2019 Tumor Marker   Patient's tumor was tested for the following markers: CA-125 Results of the tumor marker test revealed 18.7   07/19/2019 Tumor Marker    Patient's tumor was tested for the following markers: CA-125 Results of the tumor marker test revealed 16.7   08/30/2019 Tumor Marker   Patient's tumor was tested for the following markers: CA-125. Results of the tumor marker test revealed 13.9   09/15/2019 Echocardiogram    1. Left ventricular ejection fraction, by estimation, is 65 to 70%. The left ventricle has normal function. The left ventricle has no regional wall motion abnormalities. Left ventricular diastolic parameters are consistent with Grade I diastolic dysfunction (impaired relaxation). The average left ventricular global longitudinal strain is 18.1 %. The global longitudinal strain is normal.  2. Right ventricular systolic function is normal. The right ventricular size is normal.  3. The mitral valve is normal in structure. No evidence of mitral valve regurgitation. No evidence of mitral stenosis.  4. The aortic valve is normal in structure. Aortic valve regurgitation is not visualized. No aortic stenosis is present.  5. The inferior vena cava is normal in size with greater than 50% respiratory variability, suggesting right atrial pressure of 3 mmHg.     09/24/2019 Imaging   1. Stable exam. No new or progressive metastatic disease on today's study. 2. Small subcapsular hypodensity in the lateral spleen, new in the interval, but indeterminate. Attention on follow-up recommended. 3. The calcified upper abdominal and groin lymph nodes are stable as are the scattered calcified and noncalcified peritoneal implants. 4. Stable midline ventral hernia containing a short segment of colon without complicating features. 5. Aortic Atherosclerosis (ICD10-I70.0).     10/04/2019 -  Chemotherapy   Patient is on Treatment Plan : ovarian Bevacizumab q21d     10/04/2019 Tumor Marker   Patient's tumor was tested for the following markers: CA-125. Results of the tumor marker test revealed 12.9.   11/01/2019 Tumor Marker   Patient's tumor was tested  for the following markers: CA-125 Results of the tumor marker test revealed 15.4   12/13/2019 Tumor Marker   Patient's tumor was tested for the following markers: CA-125 Results of the tumor marker test revealed 14.8   12/31/2019 Imaging   1. Unchanged tiny peritoneal nodule adjacent to the spleen measuring 6 mm. Other previously noted peritoneal nodules are imperceptible on present examination. 2. Unchanged prominent, partially calcified portacaval lymph node and bilateral inguinal lymph nodes. 3. Unchanged subtle, nonspecific subcapsular lesion of the spleen.  4. No evidence of new metastatic disease in the abdomen or pelvis. 5. Status post hysterectomy. 6. Unchanged low midline ventral abdominal hernia containing a single loop of nonobstructed transverse colon. 7. Cholelithiasis. 8. Aortic Atherosclerosis (ICD10-I70.0).       01/03/2020 Tumor Marker   Patient's tumor was tested for the following markers: CA-125 Results of the tumor marker test revealed 11.7.   01/24/2020 Tumor Marker   Patient's tumor was tested for the following markers: CA-125. Results of the tumor marker test revealed 15.1   03/06/2020 Tumor Marker   Patient's tumor was tested for the following markers: CA-125. Results of the tumor marker test revealed 20.3  03/27/2020 Tumor Marker   Patient's tumor was tested for the following markers: CA-125 Results of the tumor marker test revealed 23.7   05/11/2020 Tumor Marker   Patient's tumor was tested for the following markers: CA-125. Results of the tumor marker test revealed 25.4   06/23/2020 Imaging   1. Cholelithiasis with gallbladder wall thickening and mild infiltrative edema in the porta hepatis, raising the possibility of acute cholecystitis. There is truncation of the common bile duct distally and distal choledocholithiasis is difficult to exclude. Correlate with bilirubin levels. If clinically warranted, MRCP could be utilized for further  characterization. 2. Stable tiny perisplenic nodule. 3. Other imaging findings of potential clinical significance: Small type 1 hiatal hernia. Prominent stool throughout the colon favors constipation. Ventral infraumbilical hernia contains transverse colon without findings of strangulation or obstruction. Small supraumbilical hernia contains adipose tissue. Lumbar degenerative disc disease at L5-S1. Stable tiny nodule along the lateral margin of the spleen, nonspecific. 4. Aortic atherosclerosis.     12/15/2020 Imaging   No evidence of recurrent or metastatic carcinoma within the abdomen or pelvis.   Stable ventral hernia containing transverse colon. No evidence of bowel obstruction or strangulation.   Cholelithiasis. No radiographic evidence of cholecystitis.   Tiny hiatal hernia.   Aortic Atherosclerosis (ICD10-I70.0).       PHYSICAL EXAMINATION: ECOG PERFORMANCE STATUS: 0 - Asymptomatic  Vitals:   01/29/21 0831  BP: (!) 160/73  Pulse: 75  Resp: 17  Temp: 97.7 F (36.5 C)  SpO2: 100%   Filed Weights   01/29/21 0831  Weight: 163 lb 3.2 oz (74 kg)    GENERAL:alert, no distress and comfortable SKIN: skin color, texture, turgor are normal, no rashes or significant lesions EYES: normal, Conjunctiva are pink and non-injected, sclera clear OROPHARYNX:no exudate, no erythema and lips, buccal mucosa, and tongue normal  NECK: supple, thyroid normal size, non-tender, without nodularity LYMPH:  no palpable lymphadenopathy in the cervical, axillary or inguinal LUNGS: clear to auscultation and percussion with normal breathing effort HEART: regular rate & rhythm and no murmurs and no lower extremity edema ABDOMEN:abdomen soft, non-tender and normal bowel sounds Musculoskeletal:no cyanosis of digits and no clubbing  NEURO: alert & oriented x 3 with fluent speech, no focal motor/sensory deficits  LABORATORY DATA:  I have reviewed the data as listed    Component Value Date/Time    NA 139 01/29/2021 0815   NA 139 04/14/2017 0742   K 4.2 01/29/2021 0815   K 4.0 04/14/2017 0742   CL 107 01/29/2021 0815   CO2 24 01/29/2021 0815   CO2 21 (L) 04/14/2017 0742   GLUCOSE 115 (H) 01/29/2021 0815   GLUCOSE 247 (H) 04/14/2017 0742   BUN 19 01/29/2021 0815   BUN 13.1 04/14/2017 0742   CREATININE 1.17 (H) 01/29/2021 0815   CREATININE 0.97 04/17/2020 0812   CREATININE 0.9 04/14/2017 0742   CALCIUM 9.1 01/29/2021 0815   CALCIUM 9.2 04/14/2017 0742   PROT 6.5 01/29/2021 0815   PROT 7.3 04/14/2017 0742   ALBUMIN 3.4 (L) 01/29/2021 0815   ALBUMIN 3.9 04/14/2017 0742   AST 14 (L) 01/29/2021 0815   AST 18 04/17/2020 0812   AST 17 04/14/2017 0742   ALT 8 01/29/2021 0815   ALT 11 04/17/2020 0812   ALT 14 04/14/2017 0742   ALKPHOS 56 01/29/2021 0815   ALKPHOS 70 04/14/2017 0742   BILITOT 0.5 01/29/2021 0815   BILITOT 0.3 04/17/2020 0812   BILITOT 0.37 04/14/2017 0742   GFRNONAA 51 (L)  01/29/2021 0815   GFRNONAA >60 04/17/2020 0812   GFRAA >60 01/24/2020 0924   GFRAA >60 07/19/2019 0951    No results found for: SPEP, UPEP  Lab Results  Component Value Date   WBC 6.1 01/29/2021   NEUTROABS 3.5 01/29/2021   HGB 12.3 01/29/2021   HCT 37.9 01/29/2021   MCV 96.7 01/29/2021   PLT 180 01/29/2021      Chemistry      Component Value Date/Time   NA 139 01/29/2021 0815   NA 139 04/14/2017 0742   K 4.2 01/29/2021 0815   K 4.0 04/14/2017 0742   CL 107 01/29/2021 0815   CO2 24 01/29/2021 0815   CO2 21 (L) 04/14/2017 0742   BUN 19 01/29/2021 0815   BUN 13.1 04/14/2017 0742   CREATININE 1.17 (H) 01/29/2021 0815   CREATININE 0.97 04/17/2020 0812   CREATININE 0.9 04/14/2017 0742      Component Value Date/Time   CALCIUM 9.1 01/29/2021 0815   CALCIUM 9.2 04/14/2017 0742   ALKPHOS 56 01/29/2021 0815   ALKPHOS 70 04/14/2017 0742   AST 14 (L) 01/29/2021 0815   AST 18 04/17/2020 0812   AST 17 04/14/2017 0742   ALT 8 01/29/2021 0815   ALT 11 04/17/2020 0812   ALT 14  04/14/2017 0742   BILITOT 0.5 01/29/2021 0815   BILITOT 0.3 04/17/2020 0812   BILITOT 0.37 04/14/2017 7964

## 2021-01-29 NOTE — Assessment & Plan Note (Signed)
She has intermittent elevated serum creatinine Observe closely for now I recommend increase fluid hydration as tolerated

## 2021-01-29 NOTE — Patient Instructions (Signed)
[facility] Discharge Instructions for Patients Receiving Chemotherapy  Today you received the following chemotherapy agents Avastin  To help prevent nausea and vomiting after your treatment, we encourage you to take your nausea medication as directed   If you develop nausea and vomiting that is not controlled by your nausea medication, call the clinic.   BELOW ARE SYMPTOMS THAT SHOULD BE REPORTED IMMEDIATELY:  *FEVER GREATER THAN 100.5 F  *CHILLS WITH OR WITHOUT FEVER  NAUSEA AND VOMITING THAT IS NOT CONTROLLED WITH YOUR NAUSEA MEDICATION  *UNUSUAL SHORTNESS OF BREATH  *UNUSUAL BRUISING OR BLEEDING  TENDERNESS IN MOUTH AND THROAT WITH OR WITHOUT PRESENCE OF ULCERS  *URINARY PROBLEMS  *BOWEL PROBLEMS  UNUSUAL RASH Items with * indicate a potential emergency and should be followed up as soon as possible.  Feel free to call the clinic should you have any questions or concerns. The clinic phone number is [phone].  Please show the CHEMO ALERT CARD at check-in to the Emergency Department and triage nurse.   

## 2021-01-29 NOTE — Assessment & Plan Note (Signed)
Her blood pressure is better controlled with the addition of lisinopril She has no other symptoms We will proceed with treatment as scheduled She has no evidence of disease on CT imaging The role of treatment moving forward is of maintenance in nature I recommend spacing out interval between CT imaging Her next imaging study would be in February 2023

## 2021-02-19 ENCOUNTER — Inpatient Hospital Stay: Payer: Medicare Other

## 2021-02-19 ENCOUNTER — Other Ambulatory Visit: Payer: Self-pay

## 2021-02-19 VITALS — BP 131/77 | HR 72 | Temp 98.2°F | Resp 18

## 2021-02-19 DIAGNOSIS — C5701 Malignant neoplasm of right fallopian tube: Secondary | ICD-10-CM

## 2021-02-19 DIAGNOSIS — Z5112 Encounter for antineoplastic immunotherapy: Secondary | ICD-10-CM | POA: Diagnosis not present

## 2021-02-19 DIAGNOSIS — Z7189 Other specified counseling: Secondary | ICD-10-CM

## 2021-02-19 LAB — COMPREHENSIVE METABOLIC PANEL
ALT: 7 U/L (ref 0–44)
AST: 13 U/L — ABNORMAL LOW (ref 15–41)
Albumin: 3.4 g/dL — ABNORMAL LOW (ref 3.5–5.0)
Alkaline Phosphatase: 62 U/L (ref 38–126)
Anion gap: 8 (ref 5–15)
BUN: 19 mg/dL (ref 8–23)
CO2: 24 mmol/L (ref 22–32)
Calcium: 8.8 mg/dL — ABNORMAL LOW (ref 8.9–10.3)
Chloride: 104 mmol/L (ref 98–111)
Creatinine, Ser: 1.24 mg/dL — ABNORMAL HIGH (ref 0.44–1.00)
GFR, Estimated: 48 mL/min — ABNORMAL LOW (ref >60)
Glucose, Bld: 116 mg/dL — ABNORMAL HIGH (ref 70–99)
Potassium: 4.6 mmol/L (ref 3.5–5.1)
Sodium: 136 mmol/L (ref 135–145)
Total Bilirubin: 0.6 mg/dL (ref 0.3–1.2)
Total Protein: 6.4 g/dL — ABNORMAL LOW (ref 6.5–8.1)

## 2021-02-19 LAB — CBC WITH DIFFERENTIAL/PLATELET
Abs Immature Granulocytes: 0.02 10*3/uL (ref 0.00–0.07)
Basophils Absolute: 0.1 10*3/uL (ref 0.0–0.1)
Basophils Relative: 1 %
Eosinophils Absolute: 0.5 10*3/uL (ref 0.0–0.5)
Eosinophils Relative: 8 %
HCT: 37 % (ref 36.0–46.0)
Hemoglobin: 12.2 g/dL (ref 12.0–15.0)
Immature Granulocytes: 0 %
Lymphocytes Relative: 21 %
Lymphs Abs: 1.4 10*3/uL (ref 0.7–4.0)
MCH: 31.5 pg (ref 26.0–34.0)
MCHC: 33 g/dL (ref 30.0–36.0)
MCV: 95.6 fL (ref 80.0–100.0)
Monocytes Absolute: 0.6 10*3/uL (ref 0.1–1.0)
Monocytes Relative: 9 %
Neutro Abs: 4 10*3/uL (ref 1.7–7.7)
Neutrophils Relative %: 61 %
Platelets: 212 10*3/uL (ref 150–400)
RBC: 3.87 MIL/uL (ref 3.87–5.11)
RDW: 13.4 % (ref 11.5–15.5)
WBC: 6.6 10*3/uL (ref 4.0–10.5)
nRBC: 0 % (ref 0.0–0.2)

## 2021-02-19 LAB — TOTAL PROTEIN, URINE DIPSTICK: Protein, ur: NEGATIVE mg/dL

## 2021-02-19 MED ORDER — SODIUM CHLORIDE 0.9 % IV SOLN
10.0000 mg/kg | Freq: Once | INTRAVENOUS | Status: AC
  Administered 2021-02-19: 700 mg via INTRAVENOUS
  Filled 2021-02-19: qty 16

## 2021-02-19 MED ORDER — SODIUM CHLORIDE 0.9% FLUSH
10.0000 mL | Freq: Once | INTRAVENOUS | Status: AC
  Administered 2021-02-19: 10 mL

## 2021-02-19 MED ORDER — SODIUM CHLORIDE 0.9 % IV SOLN: Freq: Once | INTRAVENOUS | Status: DC

## 2021-03-12 ENCOUNTER — Encounter: Payer: Self-pay | Admitting: Hematology and Oncology

## 2021-03-12 ENCOUNTER — Other Ambulatory Visit: Payer: Self-pay

## 2021-03-12 ENCOUNTER — Inpatient Hospital Stay: Payer: Medicare Other | Attending: Hematology and Oncology

## 2021-03-12 ENCOUNTER — Inpatient Hospital Stay: Payer: Medicare Other

## 2021-03-12 ENCOUNTER — Inpatient Hospital Stay (HOSPITAL_BASED_OUTPATIENT_CLINIC_OR_DEPARTMENT_OTHER): Payer: Medicare Other | Admitting: Hematology and Oncology

## 2021-03-12 DIAGNOSIS — Z79899 Other long term (current) drug therapy: Secondary | ICD-10-CM | POA: Insufficient documentation

## 2021-03-12 DIAGNOSIS — I10 Essential (primary) hypertension: Secondary | ICD-10-CM | POA: Diagnosis not present

## 2021-03-12 DIAGNOSIS — C563 Malignant neoplasm of bilateral ovaries: Secondary | ICD-10-CM | POA: Insufficient documentation

## 2021-03-12 DIAGNOSIS — C5701 Malignant neoplasm of right fallopian tube: Secondary | ICD-10-CM | POA: Diagnosis present

## 2021-03-12 DIAGNOSIS — Z7189 Other specified counseling: Secondary | ICD-10-CM

## 2021-03-12 DIAGNOSIS — R5381 Other malaise: Secondary | ICD-10-CM | POA: Diagnosis not present

## 2021-03-12 DIAGNOSIS — Z5112 Encounter for antineoplastic immunotherapy: Secondary | ICD-10-CM | POA: Insufficient documentation

## 2021-03-12 LAB — CBC WITH DIFFERENTIAL/PLATELET
Abs Immature Granulocytes: 0.01 10*3/uL (ref 0.00–0.07)
Basophils Absolute: 0.1 10*3/uL (ref 0.0–0.1)
Basophils Relative: 1 %
Eosinophils Absolute: 0.5 10*3/uL (ref 0.0–0.5)
Eosinophils Relative: 7 %
HCT: 39.5 % (ref 36.0–46.0)
Hemoglobin: 12.7 g/dL (ref 12.0–15.0)
Immature Granulocytes: 0 %
Lymphocytes Relative: 18 %
Lymphs Abs: 1.2 10*3/uL (ref 0.7–4.0)
MCH: 30.8 pg (ref 26.0–34.0)
MCHC: 32.2 g/dL (ref 30.0–36.0)
MCV: 95.9 fL (ref 80.0–100.0)
Monocytes Absolute: 0.5 10*3/uL (ref 0.1–1.0)
Monocytes Relative: 8 %
Neutro Abs: 4.6 10*3/uL (ref 1.7–7.7)
Neutrophils Relative %: 66 %
Platelets: 183 10*3/uL (ref 150–400)
RBC: 4.12 MIL/uL (ref 3.87–5.11)
RDW: 13.4 % (ref 11.5–15.5)
WBC: 7 10*3/uL (ref 4.0–10.5)
nRBC: 0 % (ref 0.0–0.2)

## 2021-03-12 LAB — TOTAL PROTEIN, URINE DIPSTICK: Protein, ur: NEGATIVE mg/dL

## 2021-03-12 LAB — CMP (CANCER CENTER ONLY)
ALT: 9 U/L (ref 0–44)
AST: 18 U/L (ref 15–41)
Albumin: 3.8 g/dL (ref 3.5–5.0)
Alkaline Phosphatase: 62 U/L (ref 38–126)
Anion gap: 7 (ref 5–15)
BUN: 22 mg/dL (ref 8–23)
CO2: 24 mmol/L (ref 22–32)
Calcium: 9.1 mg/dL (ref 8.9–10.3)
Chloride: 105 mmol/L (ref 98–111)
Creatinine: 0.99 mg/dL (ref 0.44–1.00)
GFR, Estimated: >60 mL/min (ref >60)
Glucose, Bld: 158 mg/dL — ABNORMAL HIGH (ref 70–99)
Potassium: 4.4 mmol/L (ref 3.5–5.1)
Sodium: 136 mmol/L (ref 135–145)
Total Bilirubin: 0.8 mg/dL (ref 0.3–1.2)
Total Protein: 6.7 g/dL (ref 6.5–8.1)

## 2021-03-12 MED ORDER — SODIUM CHLORIDE 0.9% FLUSH
10.0000 mL | INTRAVENOUS | Status: DC | PRN
  Administered 2021-03-12: 10 mL

## 2021-03-12 MED ORDER — HEPARIN SOD (PORK) LOCK FLUSH 100 UNIT/ML IV SOLN
500.0000 [IU] | Freq: Once | INTRAVENOUS | Status: AC | PRN
  Administered 2021-03-12: 500 [IU]

## 2021-03-12 MED ORDER — SODIUM CHLORIDE 0.9% FLUSH
10.0000 mL | Freq: Once | INTRAVENOUS | Status: AC
  Administered 2021-03-12: 10 mL

## 2021-03-12 MED ORDER — SODIUM CHLORIDE 0.9 % IV SOLN
10.0000 mg/kg | Freq: Once | INTRAVENOUS | Status: AC
  Administered 2021-03-12: 700 mg via INTRAVENOUS
  Filled 2021-03-12: qty 16

## 2021-03-12 MED ORDER — SODIUM CHLORIDE 0.9 % IV SOLN: Freq: Once | INTRAVENOUS | Status: AC

## 2021-03-12 NOTE — Assessment & Plan Note (Signed)
She has excellent blood pressure control As above, we will consider reducing lisinopril to half

## 2021-03-12 NOTE — Patient Instructions (Signed)
[facility] MEDICAL [facility]COL[facility]  Discharge Instructions: °Thank you for choosing [facility] to provide your oncology and hematology care.  ° °If you have a lab appointment with the Cancer Center, please go directly to the Cancer Center and check in at the registration area. °  °Wear comfortable clothing and clothing appropriate for easy access to any Portacath or PICC line.  ° °We strive to give you quality time with your provider. You may need to reschedule your appointment if you arrive late (15 or more minutes).  Arriving late affects you and other patients whose appointments are after yours.  Also, if you miss three or more appointments without notifying the office, you may be dismissed from the clinic at the provider’s discretion.    °  °For prescription refill requests, have your pharmacy contact our office and allow 72 hours for refills to be completed.   ° °Today you received the following chemotherapy and/or immunotherapy agents: Bevacizumab.     °  °To help prevent nausea and vomiting after your treatment, we encourage you to take your nausea medication as directed. ° °BELOW ARE SYMPTOMS THAT SHOULD BE REPORTED IMMEDIATELY: °*FEVER GREATER THAN 100.4 F (38 °C) OR HIGHER °*CHILLS OR SWEATING °*NAUSEA AND VOMITING THAT IS NOT CONTROLLED WITH YOUR NAUSEA MEDICATION °*UNUSUAL SHORTNESS OF BREATH °*UNUSUAL BRUISING OR BLEEDING °*URINARY PROBLEMS (pain or burning when urinating, or frequent urination) °*BOWEL PROBLEMS (unusual diarrhea, constipation, pain near the anus) °TENDERNESS IN MOUTH AND THROAT WITH OR WITHOUT PRESENCE OF ULCERS (sore throat, sores in mouth, or a toothache) °UNUSUAL RASH, SWELLING OR PAIN  °UNUSUAL VAGINAL DISCHARGE OR ITCHING  ° °Items with * indicate a potential emergency and should be followed up as soon as possible or go to the Emergency Department if any problems should occur. ° °Please show the CHEMOTHERAPY ALERT CARD or IMMUNOTHERAPY ALERT CARD at check-in  to the Emergency Department and triage nurse. ° °Should you have questions after your visit or need to cancel or reschedule your appointment, please contact [facility]  Dept: [phone]  and follow the prompts.  Office hours are 8:00 a.m. to 4:30 p.m. Monday - Friday. Please note that voicemails left after 4:00 p.m. may not be returned until the following business day.  We are closed weekends and major holidays. You have access to a nurse at all times for urgent questions. Please call the main number to the clinic Dept: [phone] and follow the prompts. ° ° °For any non-urgent questions, you may also contact your provider using MyChart. We now offer e-Visits for anyone 18 and older to request care online for non-urgent symptoms. For details visit [email or web address]. °  °Also download the MyChart app! Go to the app store, search "MyChart", open the app, select [facility], and log in with your MyChart username and password. ° °Due to Covid, a mask is required upon entering the hospital/clinic. If you do not have a mask, one will be given to you upon arrival. For doctor visits, patients may have 1 support person aged 18 or older with them. For treatment visits, patients cannot have anyone with them due to current Covid guidelines and our immunocompromised population.  ° °

## 2021-03-12 NOTE — Assessment & Plan Note (Signed)
She has generalized deconditioning since resumption of treatment I completed another disability parking permit application form

## 2021-03-12 NOTE — Progress Notes (Signed)
[place] OFFICE PROGRESS NOTE  Patient Care Team: Gaynelle Arabian, MD as PCP - General (Family Medicine) Heath Lark, MD as Consulting Physician (Hematology and Oncology) Everitt Amber, MD as Consulting Physician (Obstetrics and Gynecology)  ASSESSMENT & PLAN:  Adenocarcinoma of right fallopian tube [facility]) Her blood pressure is better controlled with the addition of lisinopril However, since her recent weight loss, her blood pressure is now trending low I recommend consideration of cutting the dose of lisinopril to half and continue lifestyle modifications She has no other symptoms We will proceed with treatment as scheduled She has no evidence of disease on CT imaging The role of treatment moving forward is of maintenance in nature I recommend spacing out interval between CT imaging Her next imaging study would be in February 2023  Essential hypertension She has excellent blood pressure control As above, we will consider reducing lisinopril to half  Physical debility She has generalized deconditioning since resumption of treatment I completed another disability parking permit application form  No orders of the defined types were placed in this encounter.   All questions were answered. The patient knows to call the clinic with any problems, questions or concerns. The total time spent in the appointment was 20 minutes encounter with patients including review of chart and various tests results, discussions about plan of care and coordination of care plan   Heath Lark, MD 03/12/2021 1:14 PM  INTERVAL HISTORY: Please see below for problem oriented charting. she returns for treatment follow-up with her husband She show me documented blood pressure over the past month which is generally normal or low She have no abdominal symptoms such as changes in bowel habits or nausea Overall, she tolerated treatment well except for slight physical weakness  REVIEW OF SYSTEMS:    Constitutional: Denies fevers, chills or abnormal weight loss Eyes: Denies blurriness of vision Ears, nose, mouth, throat, and face: Denies mucositis or sore throat Respiratory: Denies cough, dyspnea or wheezes Cardiovascular: Denies palpitation, chest discomfort or lower extremity swelling Gastrointestinal:  Denies nausea, heartburn or change in bowel habits Skin: Denies abnormal skin rashes Lymphatics: Denies new lymphadenopathy or easy bruising Neurological:Denies numbness, tingling or new weaknesses Behavioral/Psych: Mood is stable, no new changes  All other systems were reviewed with the patient and are negative.  I have reviewed the past medical history, past surgical history, social history and family history with the patient and they are unchanged from previous note.  ALLERGIES:  is allergic to dilaudid [hydromorphone hcl], morphine and related, and sudafed [pseudoephedrine hcl].  MEDICATIONS:  Current Outpatient Medications  Medication Sig Dispense Refill   acetaminophen (TYLENOL) 325 MG tablet Take 975 mg by mouth every 6 (six) hours as needed (pain).     amLODipine (NORVASC) 10 MG tablet TAKE 1 TABLET(10 MG) BY MOUTH DAILY 30 tablet 9   lidocaine-prilocaine (EMLA) cream Apply to affected area once daily as directed. 30 g 3   lisinopril (ZESTRIL) 20 MG tablet TAKE 1 TABLET(20 MG) BY MOUTH DAILY 90 tablet 3   ondansetron (ZOFRAN) 8 MG tablet Take 1 tablet (8 mg total) by mouth every 8 (eight) hours as needed. 30 tablet 1   prochlorperazine (COMPAZINE) 10 MG tablet TAKE 1 TABLET BY MOUTH EVERY 6 HOURS AS NEEDED FOR NAUSEA AND/OR VOMITING 30 tablet 1   venlafaxine XR (EFFEXOR-XR) 37.5 MG 24 hr capsule TAKE 1 CAPSULE BY MOUTH DAILY WITH BREAKFAST 90 capsule 3   No current facility-administered medications for this visit.   Facility-Administered Medications  Ordered in Other Visits  Medication Dose Route Frequency Provider Last Rate Last Admin   sodium chloride flush (NS) 0.9 %  injection 10 mL  10 mL Intravenous PRN Alvy Bimler, Fernandez Kenley, MD   10 mL at 02/11/17 0839   sodium chloride flush (NS) 0.9 % injection 10 mL  10 mL Intravenous PRN Alvy Bimler, Dash Cardarelli, MD   10 mL at 03/04/17 1510   sodium chloride flush (NS) 0.9 % injection 10 mL  10 mL Intracatheter PRN Alvy Bimler, Karletta Millay, MD   10 mL at 03/12/21 1104    SUMMARY OF ONCOLOGIC HISTORY: Oncology History Overview Note  Neg genetics   Adenocarcinoma of right fallopian tube ([place])  08/12/2016 Initial Diagnosis   The patient has many months of vague symptoms of fullness in the pelvis, urinary frequency and difficulty with defecation. She denies vaginal bleeding or rectal bleeding.     08/12/2016 Imaging   Abdomen X-ray in the ER: Moderate colonic stool burden without evidence of enteric obstruction   11/13/2016 Imaging   TVUS was performed on 11/13/16 which showed a large hypoechoic lobular mass seen posterior to the uterus measuring 18.2x16.1x11.8cm, blood flow was seen along the periphery of the mass. It was considered to be likely to be a fibroid vs pelvic mass vs ovarian mass. Neither ovary was removed. Moderate amount of free fluid in the pelvis. THe uterus measures 4x10x4cm. The endometrium is 58m.    12/05/2016 Imaging   MR pelvis 1. Mild limitations as detailed above. 2. Heterogeneous pelvic mass is favored to arise from the right ovary. Favor a solid ovarian neoplasm such as fibroma/Brenner's tumor. Given lesion size and heterogeneous T2 signal out, complicating torsion cannot be excluded. 3. Moderate abdominopelvic ascites, without specific evidence of peritoneal metastasis. Consider further evaluation with contrast-enhanced abdominopelvic CT.    12/26/2016 Pathology Results   1. Uterus, ovaries and fallopian tubes - ADENOCARCINOMA INVOLVING RIGHT FALLOPIAN TUBE, RIGHT AND LEFT OVARIES, UTERINE SEROSA AND PELVIC MASS. - SEE ONCOLOGY TABLE AND COMMENT. - UTERINE CERVIX, ENDOMETRIUM, MYOMETRIUM AND LEFT FALLOPIAN TUBE FREE OF  TUMOR. 2. Omentum, resection for tumor - ADENOCARCINOMA. Microscopic Comment 1. ONCOLOGY TABLE - FALLOPIAN TUBE. SEE COMMENT 1. Specimen, including laterality: Uterus, bilateral adnexa, pelvic mass and omentum. 2. Procedure: Hysterectomy with bilateral salpingo-oophorectomy, pelvic mass excision and omentectomy. 3. Lymph node sampling performed: No 4. Tumor site: See comment. 5. Tumor location in fallopian tube: Right fallopian tube fimbria 6. Specimen integrity (intact/ruptured/disrupted): Intact 7. Tumor size (cm): 18 cm, see comment. 8. Histologic type: Adenocarcinoma 9. Grade: High grade 10. Microscopic tumor extension: Tumor involves right fallopian tube, right and left ovaries, uterine serosa, pelvic mass and omentum. 11. Margins: See comment. 12. Lymph-Vascular invasion: Present 13. Lymph nodes: # examined: 0; # positive: N/A 14. TNM: pT3c, pNX 15. FIGO Stage (based on pathologic findings, needs clinical correlation: III-C 16. Comments: There is an 18 cm pelvic mass which is a high grade adenocarcinoma and there is a 12.2 cm  segment of omentum which is extensively involved with adenocarcinoma. The tumor also involves the fimbria of the right fallopian tube and the parenchyma of the right and left ovaries as well as uterine serosa. The fimbria of the right fallopian has intraepithelial atypia consistent with a precursor lesion and therefore this is most consistent with primary fallopian tube adenocarcinoma. A primary peritoneal serous carcinoma is a less likely possibility.    12/26/2016 Pathology Results   PERITONEAL/ASCITIC FLUID(SPECIMEN 1 OF 1 COLLECTED 12/26/16): POORLY DIFFERENTIATED ADENOCARCINOMA   12/26/2016 Surgery  Procedure(s) Performed: Exploratory laparotomy with total abdominal hysterectomy, bilateral salpingo-oophorectomy, omentectomy radical tumor debulking for ovarian cancer .   Surgeon: Thereasa Solo, MD.      Operative Findings: 20cm left ovarian mass densely  adherent to the posterior uterus, right tube and ovary, cervix, sigmoid colon. 10cm omental cake. 4L ascites. 68mm size tumor nodules on the serosa of the terminal ileum and proximal sigmoid colon. 38mm nodules on right diaphragm.    This represented an optimal cytoreduction (R1) with 24mm nodules on intestine and diaphragm representing gross visible disease   01/16/2017 Procedure   Placement of single lumen port a cath via right internal jugular vein. The catheter tip lies at the cavo-atrial junction. A power injectable port a cath was placed and is ready for immediate use.   01/17/2017 Imaging   1. 3.5 x 7.2 x 4.6 cm fluid collection along the vaginal cuff, posterior the bladder and extending into the right adnexal space. This lesion demonstrates rim enhancement. Imaging features could be related to a loculated postoperative seroma or hematoma. Superinfection cannot be excluded by CT. Given debulking surgery was 3 weeks ago, this entire structure is un likely to represent neoplasm, but peritoneal involvement could have this appearance. 2. Small fluid collection in the left para colic gutter without rim enhancement. 3. Irregular/nodular appearance of the peritoneal M in the anatomic pelvis with areas of subtle nodularity between the stomach and the spleen. Close attention in these regions on follow-up recommended as metastatic disease is a concern. 4. Mildly enlarged hepatoduodenal ligament lymph node. Attention on follow-up recommended.   01/20/2017 Tumor Marker   Patient's tumor was tested for the following markers: CA-125 Results of the tumor marker test revealed 46.6   01/28/2017 Genetic Testing       Negative genetic testing on the [facility] panel.  The [facility] gene panel offered by [employer] includes sequencing and deletion/duplication testing of the following 28 genes: APC, ATM, BARD1, BMPR1A, BRCA1, BRCA2, BRIP1, CHD1, CDK4, CDKN2A, CHEK2, EPCAM (large rearrangement only),  MLH1, MSH2, MSH6, MUTYH, NBN, PALB2, PMS2, PTEN, RAD51C, RAD51D, SMAD4, STK11, and TP53. Sequencing was performed for select regions of POLE and POLD1, and large rearrangement analysis was performed for select regions of GREM1. The report date is January 28, 2017.  HRD testing looking for genomic instability and BRCA mutations was negative.  The report date of this test is January 27, 2017.    01/31/2017 Tumor Marker   Patient's tumor was tested for the following markers: CA-125 Results of the tumor marker test revealed 22.4   03/04/2017 Tumor Marker   Patient's tumor was tested for the following markers: CA-125 Results of the tumor marker test revealed 13.8   04/18/2017 Tumor Marker   Patient's tumor was tested for the following markers: CA-125 Results of the tumor marker test revealed 11.9   05/05/2017 Tumor Marker   Patient's tumor was tested for the following markers: CA-125 Results of the tumor marker test revealed 12   05/26/2017 Tumor Marker   Patient's tumor was tested for the following markers: CA-125 Results of the tumor marker test revealed 10.6   05/26/2017 Imaging   1. A previously noted postoperative fluid collection in the low pelvis and residual ascites seen on the prior examination has resolved on today's study. No findings to suggest residual/recurrent disease on today's examination. No definite solid organ metastasis identified in the abdomen or pelvis. No lymphadenopathy. 2. Aortic atherosclerosis.   06/10/2017 Procedure   Successful right IJ vein  Port-A-Cath explant.   03/09/2018 Tumor Marker   Patient's tumor was tested for the following markers: CA-125 Results of the tumor marker test revealed 12.1   05/26/2018 Tumor Marker   Patient's tumor was tested for the following markers: CA-125 Results of the tumor marker test revealed 13.8   09/09/2018 Tumor Marker   Patient's tumor was tested for the following markers: CA-125 Results of the tumor marker test revealed  23.9   12/18/2018 Tumor Marker   Patient's tumor was tested for the following markers: CA-125 Results of the tumor marker test revealed 43.8   02/27/2019 - 02/28/2019 Hospital Admission   She was admitted for bowel obstruction   02/27/2019 Imaging   1. High-grade small bowel obstruction with transition point in the pelvis in an area of suspected desmoplastic reaction surrounding a 1.3 cm spiculated nodule in the mesentery, suspicious for carcinoid. There is hyperenhancement near the tip of the appendix and loss of the normal fat plane between it and the adjacent sigmoid colon, potentially the site of primary lesion. 2. Multiple new small subcentimeter hyperdense lesions scattered along the liver capsule, concerning for metastases. Enlarged hyperenhancing gastrohepatic and portacaval lymph nodes concerning for nodal metastases. 3. Very mild right hydroureteronephrosis to the level of the desmoplastic reaction in the pelvis, potentially involving the right ureter. 4. Infraumbilical ventral abdominal wall diastasis containing a small nondilated portion of transverse colon. 5. Trace ascites. 6. Cholelithiasis. 7.  Aortic atherosclerosis (ICD10-I70.0).     03/05/2019 Tumor Marker   Patient's tumor was tested for the following markers: CA-125 Results of the tumor marker test revealed 70.1   03/12/2019 Echocardiogram   IMPRESSIONS     1. Left ventricular ejection fraction, by visual estimation, is 60 to 65%. The left ventricle has normal function. There is no left ventricular hypertrophy.  2. Left ventricular diastolic parameters are consistent with Grade I diastolic dysfunction (impaired relaxation).  3. Global right ventricle has normal systolic function.The right ventricular size is normal. No increase in right ventricular wall thickness.  4. Left atrial size was normal.  5. Right atrial size was normal.  6. The pericardium was not assessed.  7. The mitral valve is normal in structure. No  evidence of mitral valve regurgitation.  8. The tricuspid valve is normal in structure. Tricuspid valve regurgitation is trivial.  9. The aortic valve is normal in structure. Aortic valve regurgitation is not visualized. 10. The pulmonic valve was grossly normal. Pulmonic valve regurgitation is not visualized. 11. The average left ventricular global longitudinal strain is -17.6 %.   03/16/2019 Procedure   Successful placement of a right internal jugular approach power injectable Port-A-Cath. The catheter is ready for immediate use.     04/19/2019 Tumor Marker   Patient's tumor was tested for the following markers: CA-125 Results of the tumor marker test revealed 39.8   05/17/2019 Tumor Marker   Patient's tumor was tested for the following markers: CA-125 Results of the tumor marker test revealed 27   05/28/2019 Tumor Marker   Patient's tumor was tested for the following markers: CA-125 Results of the tumor marker test revealed 27.7.   06/11/2019 Imaging   1. No new or progressive metastatic disease in the abdomen or pelvis. 2. Tiny noncalcified perisplenic implant is decreased. Calcified pericardiophrenic, retroperitoneal and bilateral inguinal lymph nodes and scattered small calcified perihepatic and perisplenic implants are stable. 3.  Aortic Atherosclerosis (ICD10-I70.0).       06/17/2019 Echocardiogram    1. Left ventricular ejection fraction, by estimation,  is 65 to 70%. The left ventricle has normal function. The left ventricle has no regional wall motion abnormalities. Left ventricular diastolic parameters were normal.  2. Right ventricular systolic function is normal. The right ventricular size is normal.  3. The mitral valve is normal in structure and function. No evidence of mitral valve regurgitation. No evidence of mitral stenosis.  4. The aortic valve is normal in structure and function. Aortic valve regurgitation is not visualized. No aortic stenosis is present.   06/21/2019  Tumor Marker   Patient's tumor was tested for the following markers: CA-125 Results of the tumor marker test revealed 18.7   07/19/2019 Tumor Marker   Patient's tumor was tested for the following markers: CA-125 Results of the tumor marker test revealed 16.7   08/30/2019 Tumor Marker   Patient's tumor was tested for the following markers: CA-125. Results of the tumor marker test revealed 13.9   09/15/2019 Echocardiogram    1. Left ventricular ejection fraction, by estimation, is 65 to 70%. The left ventricle has normal function. The left ventricle has no regional wall motion abnormalities. Left ventricular diastolic parameters are consistent with Grade I diastolic dysfunction (impaired relaxation). The average left ventricular global longitudinal strain is 18.1 %. The global longitudinal strain is normal.  2. Right ventricular systolic function is normal. The right ventricular size is normal.  3. The mitral valve is normal in structure. No evidence of mitral valve regurgitation. No evidence of mitral stenosis.  4. The aortic valve is normal in structure. Aortic valve regurgitation is not visualized. No aortic stenosis is present.  5. The inferior vena cava is normal in size with greater than 50% respiratory variability, suggesting right atrial pressure of 3 mmHg.     09/24/2019 Imaging   1. Stable exam. No new or progressive metastatic disease on today's study. 2. Small subcapsular hypodensity in the lateral spleen, new in the interval, but indeterminate. Attention on follow-up recommended. 3. The calcified upper abdominal and groin lymph nodes are stable as are the scattered calcified and noncalcified peritoneal implants. 4. Stable midline ventral hernia containing a short segment of colon without complicating features. 5. Aortic Atherosclerosis (ICD10-I70.0).     10/04/2019 -  Chemotherapy   Patient is on Treatment Plan : ovarian Bevacizumab q21d     10/04/2019 Tumor Marker   Patient's  tumor was tested for the following markers: CA-125. Results of the tumor marker test revealed 12.9.   11/01/2019 Tumor Marker   Patient's tumor was tested for the following markers: CA-125 Results of the tumor marker test revealed 15.4   12/13/2019 Tumor Marker   Patient's tumor was tested for the following markers: CA-125 Results of the tumor marker test revealed 14.8   12/31/2019 Imaging   1. Unchanged tiny peritoneal nodule adjacent to the spleen measuring 6 mm. Other previously noted peritoneal nodules are imperceptible on present examination. 2. Unchanged prominent, partially calcified portacaval lymph node and bilateral inguinal lymph nodes. 3. Unchanged subtle, nonspecific subcapsular lesion of the spleen.  4. No evidence of new metastatic disease in the abdomen or pelvis. 5. Status post hysterectomy. 6. Unchanged low midline ventral abdominal hernia containing a single loop of nonobstructed transverse colon. 7. Cholelithiasis. 8. Aortic Atherosclerosis (ICD10-I70.0).       01/03/2020 Tumor Marker   Patient's tumor was tested for the following markers: CA-125 Results of the tumor marker test revealed 11.7.   01/24/2020 Tumor Marker   Patient's tumor was tested for the following markers: CA-125. Results of the  tumor marker test revealed 15.1   03/06/2020 Tumor Marker   Patient's tumor was tested for the following markers: CA-125. Results of the tumor marker test revealed 20.3   03/27/2020 Tumor Marker   Patient's tumor was tested for the following markers: CA-125 Results of the tumor marker test revealed 23.7   05/11/2020 Tumor Marker   Patient's tumor was tested for the following markers: CA-125. Results of the tumor marker test revealed 25.4   06/23/2020 Imaging   1. Cholelithiasis with gallbladder wall thickening and mild infiltrative edema in the porta hepatis, raising the possibility of acute cholecystitis. There is truncation of the common bile duct distally and distal  choledocholithiasis is difficult to exclude. Correlate with bilirubin levels. If clinically warranted, MRCP could be utilized for further characterization. 2. Stable tiny perisplenic nodule. 3. Other imaging findings of potential clinical significance: Small type 1 hiatal hernia. Prominent stool throughout the colon favors constipation. Ventral infraumbilical hernia contains transverse colon without findings of strangulation or obstruction. Small supraumbilical hernia contains adipose tissue. Lumbar degenerative disc disease at L5-S1. Stable tiny nodule along the lateral margin of the spleen, nonspecific. 4. Aortic atherosclerosis.     12/15/2020 Imaging   No evidence of recurrent or metastatic carcinoma within the abdomen or pelvis.   Stable ventral hernia containing transverse colon. No evidence of bowel obstruction or strangulation.   Cholelithiasis. No radiographic evidence of cholecystitis.   Tiny hiatal hernia.   Aortic Atherosclerosis (ICD10-I70.0).       PHYSICAL EXAMINATION: ECOG PERFORMANCE STATUS: 1 - Symptomatic but completely ambulatory  Vitals:   03/12/21 0849  BP: (!) 143/59  Pulse: 77  Resp: 18  Temp: 97.8 F (36.6 C)  SpO2: 100%   Filed Weights   03/12/21 0849  Weight: 158 lb 12.8 oz (72 kg)    GENERAL:alert, no distress and comfortable SKIN: skin color, texture, turgor are normal, no rashes or significant lesions EYES: normal, Conjunctiva are pink and non-injected, sclera clear OROPHARYNX:no exudate, no erythema and lips, buccal mucosa, and tongue normal  NECK: supple, thyroid normal size, non-tender, without nodularity LYMPH:  no palpable lymphadenopathy in the cervical, axillary or inguinal LUNGS: clear to auscultation and percussion with normal breathing effort HEART: regular rate & rhythm and no murmurs and no lower extremity edema ABDOMEN:abdomen soft, non-tender and normal bowel sounds Musculoskeletal:no cyanosis of digits and no clubbing  NEURO:  alert & oriented x 3 with fluent speech, no focal motor/sensory deficits  LABORATORY DATA:  I have reviewed the data as listed    Component Value Date/Time   NA 136 03/12/2021 0832   NA 139 04/14/2017 0742   K 4.4 03/12/2021 0832   K 4.0 04/14/2017 0742   CL 105 03/12/2021 0832   CO2 24 03/12/2021 0832   CO2 21 (L) 04/14/2017 0742   GLUCOSE 158 (H) 03/12/2021 0832   GLUCOSE 247 (H) 04/14/2017 0742   BUN 22 03/12/2021 0832   BUN 13.1 04/14/2017 0742   CREATININE 0.99 03/12/2021 0832   CREATININE 0.9 04/14/2017 0742   CALCIUM 9.1 03/12/2021 0832   CALCIUM 9.2 04/14/2017 0742   PROT 6.7 03/12/2021 0832   PROT 7.3 04/14/2017 0742   ALBUMIN 3.8 03/12/2021 0832   ALBUMIN 3.9 04/14/2017 0742   AST 18 03/12/2021 0832   AST 17 04/14/2017 0742   ALT 9 03/12/2021 0832   ALT 14 04/14/2017 0742   ALKPHOS 62 03/12/2021 0832   ALKPHOS 70 04/14/2017 0742   BILITOT 0.8 03/12/2021 0832   BILITOT 0.37  04/14/2017 0742   GFRNONAA >60 03/12/2021 0832   GFRAA >60 01/24/2020 0924   GFRAA >60 07/19/2019 0951    No results found for: SPEP, UPEP  Lab Results  Component Value Date   WBC 7.0 03/12/2021   NEUTROABS 4.6 03/12/2021   HGB 12.7 03/12/2021   HCT 39.5 03/12/2021   MCV 95.9 03/12/2021   PLT 183 03/12/2021      Chemistry      Component Value Date/Time   NA 136 03/12/2021 0832   NA 139 04/14/2017 0742   K 4.4 03/12/2021 0832   K 4.0 04/14/2017 0742   CL 105 03/12/2021 0832   CO2 24 03/12/2021 0832   CO2 21 (L) 04/14/2017 0742   BUN 22 03/12/2021 0832   BUN 13.1 04/14/2017 0742   CREATININE 0.99 03/12/2021 0832   CREATININE 0.9 04/14/2017 0742      Component Value Date/Time   CALCIUM 9.1 03/12/2021 0832   CALCIUM 9.2 04/14/2017 0742   ALKPHOS 62 03/12/2021 0832   ALKPHOS 70 04/14/2017 0742   AST 18 03/12/2021 0832   AST 17 04/14/2017 0742   ALT 9 03/12/2021 0832   ALT 14 04/14/2017 0742   BILITOT 0.8 03/12/2021 0832   BILITOT 0.37 04/14/2017 5056

## 2021-03-12 NOTE — Assessment & Plan Note (Signed)
Her blood pressure is better controlled with the addition of lisinopril However, since her recent weight loss, her blood pressure is now trending low I recommend consideration of cutting the dose of lisinopril to half and continue lifestyle modifications She has no other symptoms We will proceed with treatment as scheduled She has no evidence of disease on CT imaging The role of treatment moving forward is of maintenance in nature I recommend spacing out interval between CT imaging Her next imaging study would be in February 2023

## 2021-03-21 ENCOUNTER — Other Ambulatory Visit (HOSPITAL_COMMUNITY): Payer: Self-pay

## 2021-03-26 IMAGING — DX DG ABD PORTABLE 1V
1 series · 1 of 1 positions shown · non-contrast
Comparison: 02/28/2019, 11/04/2016: KUB

02/27/2019: CT abdomen/pelvis

CLINICAL DATA: Small bowel obstruction

EXAM:
PORTABLE ABDOMEN - 1 VIEW

[abdomen kub]
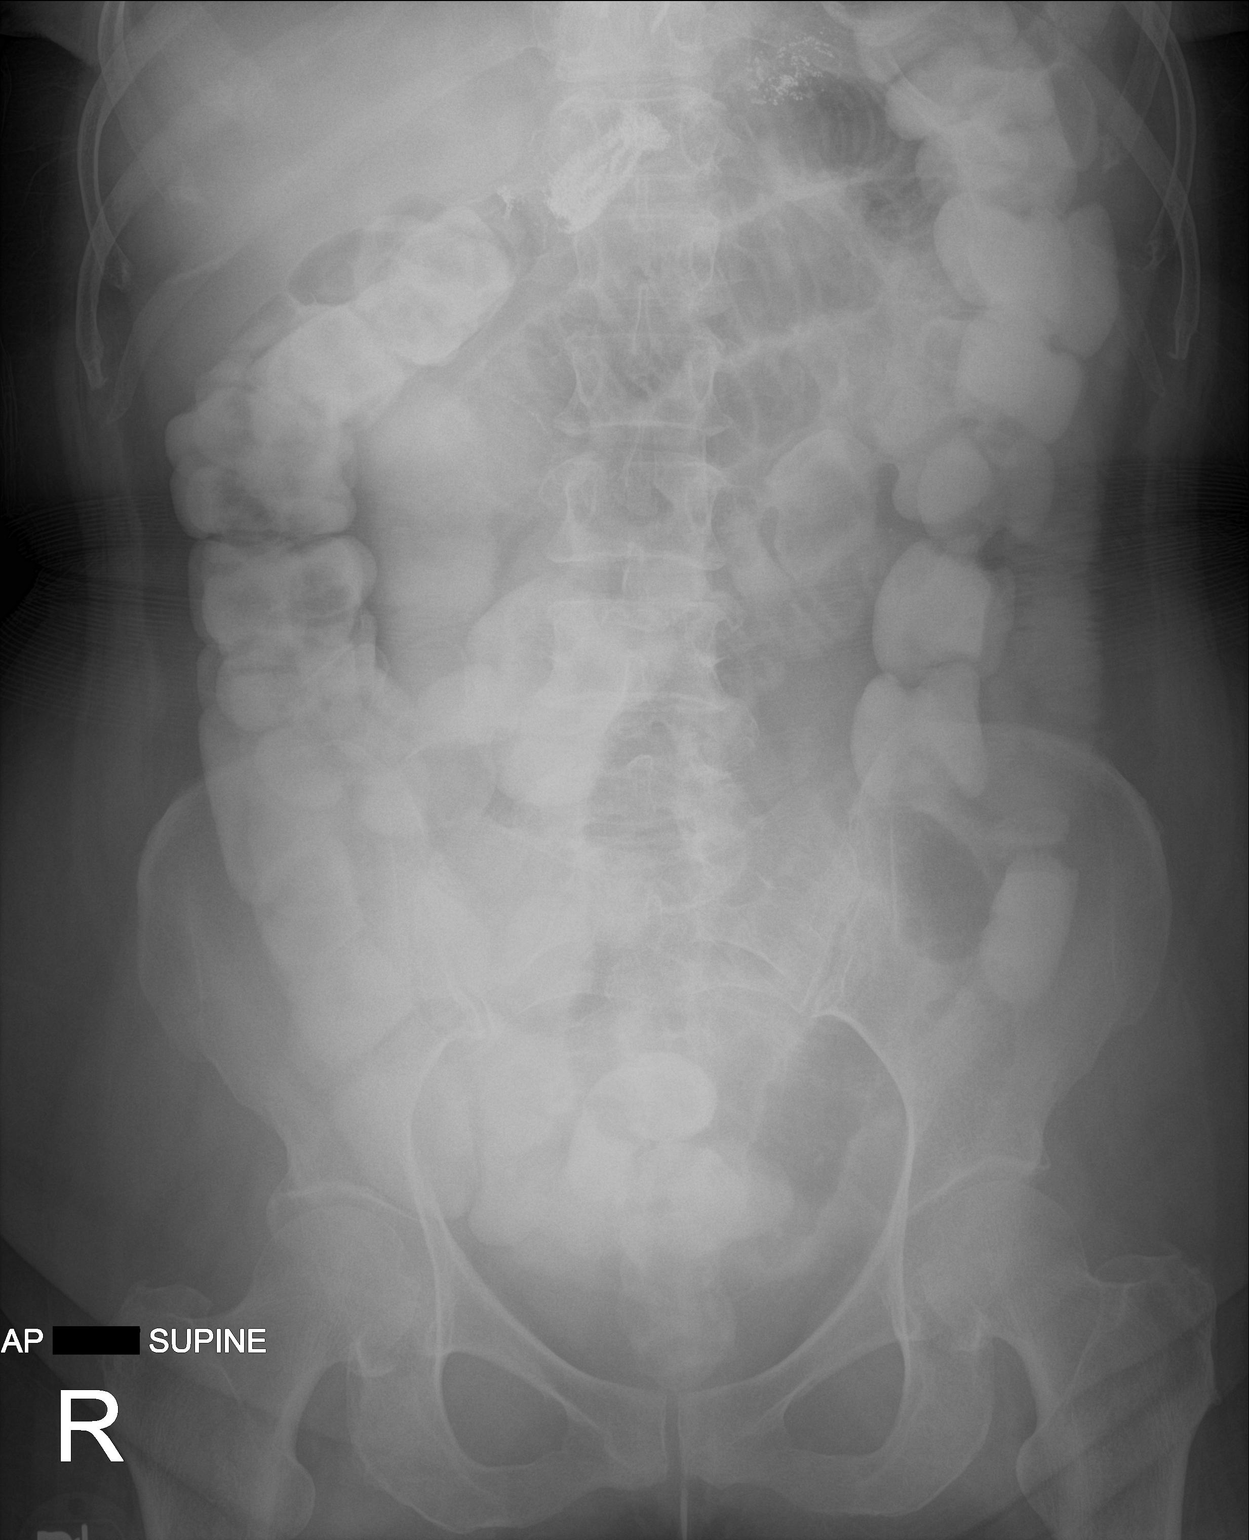

[1 of 1 positions shown; findings below may reference images not displayed]

FINDINGS: There is oral contrast material which has further transit into the
colon to the level of the rectum. There is persistent dilated small
bowel loops measuring up to 3.6 cm in diameter. There is no evidence
of pneumoperitoneum, portal venous gas or pneumatosis.

There are no pathologic calcifications along the expected course of
the ureters.

The osseous structures are unremarkable.
IMPRESSION: Persistent small-bowel obstruction. Oral contrast has traversed into
the colon and into the rectum suggesting a partial small bowel
obstruction.

## 2021-04-03 ENCOUNTER — Inpatient Hospital Stay: Payer: Medicare Other | Attending: Hematology and Oncology

## 2021-04-03 ENCOUNTER — Encounter: Payer: Self-pay | Admitting: Hematology and Oncology

## 2021-04-03 ENCOUNTER — Other Ambulatory Visit: Payer: Self-pay

## 2021-04-03 ENCOUNTER — Inpatient Hospital Stay: Payer: Medicare Other

## 2021-04-03 VITALS — BP 136/71 | HR 70 | Temp 98.0°F | Resp 17 | Wt 157.0 lb

## 2021-04-03 DIAGNOSIS — Z7189 Other specified counseling: Secondary | ICD-10-CM

## 2021-04-03 DIAGNOSIS — Z79899 Other long term (current) drug therapy: Secondary | ICD-10-CM | POA: Diagnosis not present

## 2021-04-03 DIAGNOSIS — C5701 Malignant neoplasm of right fallopian tube: Secondary | ICD-10-CM

## 2021-04-03 DIAGNOSIS — Z5112 Encounter for antineoplastic immunotherapy: Secondary | ICD-10-CM | POA: Diagnosis present

## 2021-04-03 LAB — COMPREHENSIVE METABOLIC PANEL
ALT: 8 U/L (ref 0–44)
AST: 13 U/L — ABNORMAL LOW (ref 15–41)
Albumin: 3.4 g/dL — ABNORMAL LOW (ref 3.5–5.0)
Alkaline Phosphatase: 67 U/L (ref 38–126)
Anion gap: 10 (ref 5–15)
BUN: 18 mg/dL (ref 8–23)
CO2: 23 mmol/L (ref 22–32)
Calcium: 9 mg/dL (ref 8.9–10.3)
Chloride: 105 mmol/L (ref 98–111)
Creatinine, Ser: 1.16 mg/dL — ABNORMAL HIGH (ref 0.44–1.00)
GFR, Estimated: 52 mL/min — ABNORMAL LOW (ref >60)
Glucose, Bld: 119 mg/dL — ABNORMAL HIGH (ref 70–99)
Potassium: 4.7 mmol/L (ref 3.5–5.1)
Sodium: 138 mmol/L (ref 135–145)
Total Bilirubin: 0.6 mg/dL (ref 0.3–1.2)
Total Protein: 6.8 g/dL (ref 6.5–8.1)

## 2021-04-03 LAB — CBC WITH DIFFERENTIAL/PLATELET
Abs Immature Granulocytes: 0.02 10*3/uL (ref 0.00–0.07)
Basophils Absolute: 0.1 10*3/uL (ref 0.0–0.1)
Basophils Relative: 1 %
Eosinophils Absolute: 0.6 10*3/uL — ABNORMAL HIGH (ref 0.0–0.5)
Eosinophils Relative: 8 %
HCT: 38.7 % (ref 36.0–46.0)
Hemoglobin: 12.6 g/dL (ref 12.0–15.0)
Immature Granulocytes: 0 %
Lymphocytes Relative: 20 %
Lymphs Abs: 1.3 10*3/uL (ref 0.7–4.0)
MCH: 30.7 pg (ref 26.0–34.0)
MCHC: 32.6 g/dL (ref 30.0–36.0)
MCV: 94.4 fL (ref 80.0–100.0)
Monocytes Absolute: 0.5 10*3/uL (ref 0.1–1.0)
Monocytes Relative: 7 %
Neutro Abs: 4.2 10*3/uL (ref 1.7–7.7)
Neutrophils Relative %: 64 %
Platelets: 221 10*3/uL (ref 150–400)
RBC: 4.1 MIL/uL (ref 3.87–5.11)
RDW: 13.3 % (ref 11.5–15.5)
WBC: 6.6 10*3/uL (ref 4.0–10.5)
nRBC: 0 % (ref 0.0–0.2)

## 2021-04-03 LAB — TOTAL PROTEIN, URINE DIPSTICK: Protein, ur: <30 mg/dL — AB

## 2021-04-03 MED ORDER — SODIUM CHLORIDE 0.9% FLUSH
10.0000 mL | Freq: Once | INTRAVENOUS | Status: AC
  Administered 2021-04-03: 10 mL

## 2021-04-03 MED ORDER — SODIUM CHLORIDE 0.9 % IV SOLN
10.0000 mg/kg | Freq: Once | INTRAVENOUS | Status: AC
  Administered 2021-04-03: 700 mg via INTRAVENOUS
  Filled 2021-04-03: qty 16

## 2021-04-03 MED ORDER — SODIUM CHLORIDE 0.9 % IV SOLN: Freq: Once | INTRAVENOUS | Status: AC

## 2021-04-03 MED ORDER — SODIUM CHLORIDE 0.9% FLUSH
10.0000 mL | INTRAVENOUS | Status: DC | PRN
  Administered 2021-04-03: 10 mL

## 2021-04-03 MED ORDER — HEPARIN SOD (PORK) LOCK FLUSH 100 UNIT/ML IV SOLN
500.0000 [IU] | Freq: Once | INTRAVENOUS | Status: AC | PRN
  Administered 2021-04-03: 500 [IU]

## 2021-04-05 ENCOUNTER — Encounter: Payer: Self-pay | Admitting: Hematology and Oncology

## 2021-04-19 ENCOUNTER — Telehealth: Payer: Self-pay

## 2021-04-19 NOTE — Telephone Encounter (Signed)
Notified Patient of completion of Disability Form. Fax transmission confirmation received. Copy emailed to Patient at  as requested

## 2021-04-30 ENCOUNTER — Encounter: Payer: Self-pay | Admitting: Hematology and Oncology

## 2021-04-30 ENCOUNTER — Inpatient Hospital Stay (HOSPITAL_BASED_OUTPATIENT_CLINIC_OR_DEPARTMENT_OTHER): Payer: Medicare Other | Admitting: Hematology and Oncology

## 2021-04-30 ENCOUNTER — Inpatient Hospital Stay: Payer: Medicare Other

## 2021-04-30 ENCOUNTER — Inpatient Hospital Stay: Payer: Medicare Other | Attending: Hematology and Oncology

## 2021-04-30 ENCOUNTER — Other Ambulatory Visit: Payer: Self-pay

## 2021-04-30 VITALS — BP 115/59 | HR 81 | Temp 97.4°F | Resp 18 | Ht 65.0 in | Wt 155.6 lb

## 2021-04-30 DIAGNOSIS — C5701 Malignant neoplasm of right fallopian tube: Secondary | ICD-10-CM | POA: Insufficient documentation

## 2021-04-30 DIAGNOSIS — Z79899 Other long term (current) drug therapy: Secondary | ICD-10-CM | POA: Insufficient documentation

## 2021-04-30 DIAGNOSIS — Z7189 Other specified counseling: Secondary | ICD-10-CM

## 2021-04-30 DIAGNOSIS — Z5112 Encounter for antineoplastic immunotherapy: Secondary | ICD-10-CM | POA: Diagnosis present

## 2021-04-30 DIAGNOSIS — C563 Malignant neoplasm of bilateral ovaries: Secondary | ICD-10-CM | POA: Diagnosis not present

## 2021-04-30 DIAGNOSIS — I10 Essential (primary) hypertension: Secondary | ICD-10-CM | POA: Diagnosis not present

## 2021-04-30 DIAGNOSIS — R7989 Other specified abnormal findings of blood chemistry: Secondary | ICD-10-CM

## 2021-04-30 LAB — CBC WITH DIFFERENTIAL/PLATELET
Abs Immature Granulocytes: 0.02 10*3/uL (ref 0.00–0.07)
Basophils Absolute: 0.1 10*3/uL (ref 0.0–0.1)
Basophils Relative: 1 %
Eosinophils Absolute: 0.6 10*3/uL — ABNORMAL HIGH (ref 0.0–0.5)
Eosinophils Relative: 8 %
HCT: 39.1 % (ref 36.0–46.0)
Hemoglobin: 12.5 g/dL (ref 12.0–15.0)
Immature Granulocytes: 0 %
Lymphocytes Relative: 18 %
Lymphs Abs: 1.3 10*3/uL (ref 0.7–4.0)
MCH: 30.3 pg (ref 26.0–34.0)
MCHC: 32 g/dL (ref 30.0–36.0)
MCV: 94.7 fL (ref 80.0–100.0)
Monocytes Absolute: 0.7 10*3/uL (ref 0.1–1.0)
Monocytes Relative: 10 %
Neutro Abs: 4.7 10*3/uL (ref 1.7–7.7)
Neutrophils Relative %: 63 %
Platelets: 231 10*3/uL (ref 150–400)
RBC: 4.13 MIL/uL (ref 3.87–5.11)
RDW: 13.7 % (ref 11.5–15.5)
WBC: 7.4 10*3/uL (ref 4.0–10.5)
nRBC: 0 % (ref 0.0–0.2)

## 2021-04-30 LAB — COMPREHENSIVE METABOLIC PANEL
ALT: 6 U/L (ref 0–44)
AST: 12 U/L — ABNORMAL LOW (ref 15–41)
Albumin: 3.6 g/dL (ref 3.5–5.0)
Alkaline Phosphatase: 57 U/L (ref 38–126)
Anion gap: 8 (ref 5–15)
BUN: 25 mg/dL — ABNORMAL HIGH (ref 8–23)
CO2: 24 mmol/L (ref 22–32)
Calcium: 9 mg/dL (ref 8.9–10.3)
Chloride: 105 mmol/L (ref 98–111)
Creatinine, Ser: 1.27 mg/dL — ABNORMAL HIGH (ref 0.44–1.00)
GFR, Estimated: 47 mL/min — ABNORMAL LOW (ref >60)
Glucose, Bld: 101 mg/dL — ABNORMAL HIGH (ref 70–99)
Potassium: 4.6 mmol/L (ref 3.5–5.1)
Sodium: 137 mmol/L (ref 135–145)
Total Bilirubin: 0.5 mg/dL (ref 0.3–1.2)
Total Protein: 6.6 g/dL (ref 6.5–8.1)

## 2021-04-30 LAB — TOTAL PROTEIN, URINE DIPSTICK

## 2021-04-30 MED ORDER — SODIUM CHLORIDE 0.9% FLUSH
10.0000 mL | Freq: Once | INTRAVENOUS | Status: AC
  Administered 2021-04-30: 10 mL

## 2021-04-30 MED ORDER — HEPARIN SOD (PORK) LOCK FLUSH 100 UNIT/ML IV SOLN
500.0000 [IU] | Freq: Once | INTRAVENOUS | Status: AC | PRN
  Administered 2021-04-30: 500 [IU]

## 2021-04-30 MED ORDER — SODIUM CHLORIDE 0.9% FLUSH
10.0000 mL | INTRAVENOUS | Status: DC | PRN
  Administered 2021-04-30: 10 mL

## 2021-04-30 MED ORDER — SODIUM CHLORIDE 0.9 % IV SOLN
10.0000 mg/kg | Freq: Once | INTRAVENOUS | Status: AC
  Administered 2021-04-30: 700 mg via INTRAVENOUS
  Filled 2021-04-30: qty 16

## 2021-04-30 MED ORDER — SODIUM CHLORIDE 0.9 % IV SOLN: Freq: Once | INTRAVENOUS | Status: AC

## 2021-04-30 NOTE — Assessment & Plan Note (Signed)
She has excellent blood pressure control She will continue close monitoring of blood pressure

## 2021-04-30 NOTE — Assessment & Plan Note (Signed)
She has intermittent elevated serum creatinine Observe closely for now I recommend increase fluid hydration as tolerated

## 2021-04-30 NOTE — Progress Notes (Signed)
[facility] OFFICE PROGRESS NOTE  Patient Care Team: Gaynelle Arabian, MD as PCP - General (Family Medicine) Heath Lark, MD as Consulting Physician (Hematology and Oncology) Everitt Amber, MD as Consulting Physician (Obstetrics and Gynecology)  ASSESSMENT & PLAN:  Adenocarcinoma of right fallopian tube [facility]) Her blood pressure is better controlled with the addition of lisinopril However, since her recent weight loss, her blood pressure is now trending low She has no other symptoms We will proceed with treatment as scheduled She has no evidence of disease on CT imaging The role of treatment moving forward is of maintenance in nature I recommend spacing out interval between CT imaging Her next imaging study would be in February 2023  Elevated serum creatinine She has intermittent elevated serum creatinine Observe closely for now I recommend increase fluid hydration as tolerated  Essential hypertension She has excellent blood pressure control She will continue close monitoring of blood pressure  Orders Placed This Encounter  Procedures   CT ABDOMEN PELVIS W CONTRAST    Standing Status:   Future    Standing Expiration Date:   04/30/2022    Order Specific Question:   If indicated for the ordered procedure, I authorize the administration of contrast media per Radiology protocol    Answer:   Yes    Order Specific Question:   Preferred imaging location?    Answer:   [facility]    Order Specific Question:   Radiology Contrast Protocol - do NOT remove file path    Answer:   [email or web address]    All questions were answered. The patient knows to call the clinic with any problems, questions or concerns. The total time spent in the appointment was 20 minutes encounter with patients including review of chart and various tests results, discussions about plan of care and coordination of care plan   Heath Lark, MD 04/30/2021 2:09  PM  INTERVAL HISTORY: Please see below for problem oriented charting. she returns for treatment follow-up with her husband She is on maintenance bevacizumab Her documented blood pressure from home were within normal range She denies abdominal pain, bloating or changes in bowel habits  REVIEW OF SYSTEMS:   Constitutional: Denies fevers, chills or abnormal weight loss Eyes: Denies blurriness of vision Ears, nose, mouth, throat, and face: Denies mucositis or sore throat Respiratory: Denies cough, dyspnea or wheezes Cardiovascular: Denies palpitation, chest discomfort or lower extremity swelling Gastrointestinal:  Denies nausea, heartburn or change in bowel habits Skin: Denies abnormal skin rashes Lymphatics: Denies new lymphadenopathy or easy bruising Neurological:Denies numbness, tingling or new weaknesses Behavioral/Psych: Mood is stable, no new changes  All other systems were reviewed with the patient and are negative.  I have reviewed the past medical history, past surgical history, social history and family history with the patient and they are unchanged from previous note.  ALLERGIES:  is allergic to dilaudid [hydromorphone hcl], morphine and related, and sudafed [pseudoephedrine hcl].  MEDICATIONS:  Current Outpatient Medications  Medication Sig Dispense Refill   acetaminophen (TYLENOL) 325 MG tablet Take 975 mg by mouth every 6 (six) hours as needed (pain).     amLODipine (NORVASC) 10 MG tablet TAKE 1 TABLET(10 MG) BY MOUTH DAILY 30 tablet 9   lidocaine-prilocaine (EMLA) cream Apply to affected area once daily as directed. 30 g 3   lisinopril (ZESTRIL) 20 MG tablet TAKE 1 TABLET(20 MG) BY MOUTH DAILY 90 tablet 3   ondansetron (ZOFRAN) 8 MG tablet Take 1 tablet (8 mg total) by mouth  every 8 (eight) hours as needed. 30 tablet 1   prochlorperazine (COMPAZINE) 10 MG tablet TAKE 1 TABLET BY MOUTH EVERY 6 HOURS AS NEEDED FOR NAUSEA AND/OR VOMITING 30 tablet 1   venlafaxine XR  (EFFEXOR-XR) 37.5 MG 24 hr capsule TAKE 1 CAPSULE BY MOUTH DAILY WITH BREAKFAST 90 capsule 3   No current facility-administered medications for this visit.   Facility-Administered Medications Ordered in Other Visits  Medication Dose Route Frequency Provider Last Rate Last Admin   sodium chloride flush (NS) 0.9 % injection 10 mL  10 mL Intravenous PRN Alvy Bimler, Bessye Stith, MD   10 mL at 02/11/17 0839   sodium chloride flush (NS) 0.9 % injection 10 mL  10 mL Intravenous PRN Alvy Bimler, Jermeka Schlotterbeck, MD   10 mL at 03/04/17 1510   sodium chloride flush (NS) 0.9 % injection 10 mL  10 mL Intracatheter PRN Heath Lark, MD   10 mL at 04/30/21 0958    SUMMARY OF ONCOLOGIC HISTORY: Oncology History Overview Note  Neg genetics   Adenocarcinoma of right fallopian tube ([place])  08/12/2016 Initial Diagnosis   The patient has many months of vague symptoms of fullness in the pelvis, urinary frequency and difficulty with defecation. She denies vaginal bleeding or rectal bleeding.     08/12/2016 Imaging   Abdomen X-ray in the ER: Moderate colonic stool burden without evidence of enteric obstruction   11/13/2016 Imaging   TVUS was performed on 11/13/16 which showed a large hypoechoic lobular mass seen posterior to the uterus measuring 18.2x16.1x11.8cm, blood flow was seen along the periphery of the mass. It was considered to be likely to be a fibroid vs pelvic mass vs ovarian mass. Neither ovary was removed. Moderate amount of free fluid in the pelvis. THe uterus measures 4x10x4cm. The endometrium is 61m.    12/05/2016 Imaging   MR pelvis 1. Mild limitations as detailed above. 2. Heterogeneous pelvic mass is favored to arise from the right ovary. Favor a solid ovarian neoplasm such as fibroma/Brenner's tumor. Given lesion size and heterogeneous T2 signal out, complicating torsion cannot be excluded. 3. Moderate abdominopelvic ascites, without specific evidence of peritoneal metastasis. Consider further evaluation with  contrast-enhanced abdominopelvic CT.    12/26/2016 Pathology Results   1. Uterus, ovaries and fallopian tubes - ADENOCARCINOMA INVOLVING RIGHT FALLOPIAN TUBE, RIGHT AND LEFT OVARIES, UTERINE SEROSA AND PELVIC MASS. - SEE ONCOLOGY TABLE AND COMMENT. - UTERINE CERVIX, ENDOMETRIUM, MYOMETRIUM AND LEFT FALLOPIAN TUBE FREE OF TUMOR. 2. Omentum, resection for tumor - ADENOCARCINOMA. Microscopic Comment 1. ONCOLOGY TABLE - FALLOPIAN TUBE. SEE COMMENT 1. Specimen, including laterality: Uterus, bilateral adnexa, pelvic mass and omentum. 2. Procedure: Hysterectomy with bilateral salpingo-oophorectomy, pelvic mass excision and omentectomy. 3. Lymph node sampling performed: No 4. Tumor site: See comment. 5. Tumor location in fallopian tube: Right fallopian tube fimbria 6. Specimen integrity (intact/ruptured/disrupted): Intact 7. Tumor size (cm): 18 cm, see comment. 8. Histologic type: Adenocarcinoma 9. Grade: High grade 10. Microscopic tumor extension: Tumor involves right fallopian tube, right and left ovaries, uterine serosa, pelvic mass and omentum. 11. Margins: See comment. 12. Lymph-Vascular invasion: Present 13. Lymph nodes: # examined: 0; # positive: N/A 14. TNM: pT3c, pNX 15. FIGO Stage (based on pathologic findings, needs clinical correlation: III-C 16. Comments: There is an 18 cm pelvic mass which is a high grade adenocarcinoma and there is a 12.2 cm  segment of omentum which is extensively involved with adenocarcinoma. The tumor also involves the fimbria of the right fallopian tube and the parenchyma of the  right and left ovaries as well as uterine serosa. The fimbria of the right fallopian has intraepithelial atypia consistent with a precursor lesion and therefore this is most consistent with primary fallopian tube adenocarcinoma. A primary peritoneal serous carcinoma is a less likely possibility.    12/26/2016 Pathology Results   PERITONEAL/ASCITIC FLUID(SPECIMEN 1 OF 1 COLLECTED  12/26/16): POORLY DIFFERENTIATED ADENOCARCINOMA   12/26/2016 Surgery   Procedure(s) Performed: Exploratory laparotomy with total abdominal hysterectomy, bilateral salpingo-oophorectomy, omentectomy radical tumor debulking for ovarian cancer .   Surgeon: Thereasa Solo, MD.      Operative Findings: 20cm left ovarian mass densely adherent to the posterior uterus, right tube and ovary, cervix, sigmoid colon. 10cm omental cake. 4L ascites. 14m size tumor nodules on the serosa of the terminal ileum and proximal sigmoid colon. 198mnodules on right diaphragm.    This represented an optimal cytoreduction (R1) with 21m53modules on intestine and diaphragm representing gross visible disease   01/16/2017 Procedure   Placement of single lumen port a cath via right internal jugular vein. The catheter tip lies at the cavo-atrial junction. A power injectable port a cath was placed and is ready for immediate use.   01/17/2017 Imaging   1. 3.5 x 7.2 x 4.6 cm fluid collection along the vaginal cuff, posterior the bladder and extending into the right adnexal space. This lesion demonstrates rim enhancement. Imaging features could be related to a loculated postoperative seroma or hematoma. Superinfection cannot be excluded by CT. Given debulking surgery was 3 weeks ago, this entire structure is un likely to represent neoplasm, but peritoneal involvement could have this appearance. 2. Small fluid collection in the left para colic gutter without rim enhancement. 3. Irregular/nodular appearance of the peritoneal M in the anatomic pelvis with areas of subtle nodularity between the stomach and the spleen. Close attention in these regions on follow-up recommended as metastatic disease is a concern. 4. Mildly enlarged hepatoduodenal ligament lymph node. Attention on follow-up recommended.   01/20/2017 Tumor Marker   Patient's tumor was tested for the following markers: CA-125 Results of the tumor marker test revealed 46.6    01/28/2017 Genetic Testing       Negative genetic testing on the [facility].  The [facility] panel offered by [employer] sequencing and deletion/duplication testing of the following 28 genes: APC, ATM, BARD1, BMPR1A, BRCA1, BRCA2, BRIP1, CHD1, CDK4, CDKN2A, CHEK2, EPCAM (large rearrangement only), MLH1, MSH2, MSH6, MUTYH, NBN, PALB2, PMS2, PTEN, RAD51C, RAD51D, SMAD4, STK11, and TP53. Sequencing was performed for select regions of POLE and POLD1, and large rearrangement analysis was performed for select regions of GREM1. The report date is January 28, 2017.  HRD testing looking for genomic instability and BRCA mutations was negative.  The report date of this test is January 27, 2017.    01/31/2017 Tumor Marker   Patient's tumor was tested for the following markers: CA-125 Results of the tumor marker test revealed 22.4   03/04/2017 Tumor Marker   Patient's tumor was tested for the following markers: CA-125 Results of the tumor marker test revealed 13.8   04/18/2017 Tumor Marker   Patient's tumor was tested for the following markers: CA-125 Results of the tumor marker test revealed 11.9   05/05/2017 Tumor Marker   Patient's tumor was tested for the following markers: CA-125 Results of the tumor marker test revealed 12   05/26/2017 Tumor Marker   Patient's tumor was tested for the following markers: CA-125 Results of the tumor marker  test revealed 10.6   05/26/2017 Imaging   1. A previously noted postoperative fluid collection in the low pelvis and residual ascites seen on the prior examination has resolved on today's study. No findings to suggest residual/recurrent disease on today's examination. No definite solid organ metastasis identified in the abdomen or pelvis. No lymphadenopathy. 2. Aortic atherosclerosis.   06/10/2017 Procedure   Successful right IJ vein Port-A-Cath explant.   03/09/2018 Tumor Marker   Patient's tumor was tested for the following  markers: CA-125 Results of the tumor marker test revealed 12.1   05/26/2018 Tumor Marker   Patient's tumor was tested for the following markers: CA-125 Results of the tumor marker test revealed 13.8   09/09/2018 Tumor Marker   Patient's tumor was tested for the following markers: CA-125 Results of the tumor marker test revealed 23.9   12/18/2018 Tumor Marker   Patient's tumor was tested for the following markers: CA-125 Results of the tumor marker test revealed 43.8   02/27/2019 - 02/28/2019 Hospital Admission   She was admitted for bowel obstruction   02/27/2019 Imaging   1. High-grade small bowel obstruction with transition point in the pelvis in an area of suspected desmoplastic reaction surrounding a 1.3 cm spiculated nodule in the mesentery, suspicious for carcinoid. There is hyperenhancement near the tip of the appendix and loss of the normal fat plane between it and the adjacent sigmoid colon, potentially the site of primary lesion. 2. Multiple new small subcentimeter hyperdense lesions scattered along the liver capsule, concerning for metastases. Enlarged hyperenhancing gastrohepatic and portacaval lymph nodes concerning for nodal metastases. 3. Very mild right hydroureteronephrosis to the level of the desmoplastic reaction in the pelvis, potentially involving the right ureter. 4. Infraumbilical ventral abdominal wall diastasis containing a small nondilated portion of transverse colon. 5. Trace ascites. 6. Cholelithiasis. 7.  Aortic atherosclerosis (ICD10-I70.0).     03/05/2019 Tumor Marker   Patient's tumor was tested for the following markers: CA-125 Results of the tumor marker test revealed 70.1   03/12/2019 Echocardiogram   IMPRESSIONS     1. Left ventricular ejection fraction, by visual estimation, is 60 to 65%. The left ventricle has normal function. There is no left ventricular hypertrophy.  2. Left ventricular diastolic parameters are consistent with Grade I diastolic  dysfunction (impaired relaxation).  3. Global right ventricle has normal systolic function.The right ventricular size is normal. No increase in right ventricular wall thickness.  4. Left atrial size was normal.  5. Right atrial size was normal.  6. The pericardium was not assessed.  7. The mitral valve is normal in structure. No evidence of mitral valve regurgitation.  8. The tricuspid valve is normal in structure. Tricuspid valve regurgitation is trivial.  9. The aortic valve is normal in structure. Aortic valve regurgitation is not visualized. 10. The pulmonic valve was grossly normal. Pulmonic valve regurgitation is not visualized. 11. The average left ventricular global longitudinal strain is -17.6 %.   03/16/2019 Procedure   Successful placement of a right internal jugular approach power injectable Port-A-Cath. The catheter is ready for immediate use.     04/19/2019 Tumor Marker   Patient's tumor was tested for the following markers: CA-125 Results of the tumor marker test revealed 39.8   05/17/2019 Tumor Marker   Patient's tumor was tested for the following markers: CA-125 Results of the tumor marker test revealed 27   05/28/2019 Tumor Marker   Patient's tumor was tested for the following markers: CA-125 Results of the tumor marker test  revealed 27.7.   06/11/2019 Imaging   1. No new or progressive metastatic disease in the abdomen or pelvis. 2. Tiny noncalcified perisplenic implant is decreased. Calcified pericardiophrenic, retroperitoneal and bilateral inguinal lymph nodes and scattered small calcified perihepatic and perisplenic implants are stable. 3.  Aortic Atherosclerosis (ICD10-I70.0).       06/17/2019 Echocardiogram    1. Left ventricular ejection fraction, by estimation, is 65 to 70%. The left ventricle has normal function. The left ventricle has no regional wall motion abnormalities. Left ventricular diastolic parameters were normal.  2. Right ventricular systolic  function is normal. The right ventricular size is normal.  3. The mitral valve is normal in structure and function. No evidence of mitral valve regurgitation. No evidence of mitral stenosis.  4. The aortic valve is normal in structure and function. Aortic valve regurgitation is not visualized. No aortic stenosis is present.   06/21/2019 Tumor Marker   Patient's tumor was tested for the following markers: CA-125 Results of the tumor marker test revealed 18.7   07/19/2019 Tumor Marker   Patient's tumor was tested for the following markers: CA-125 Results of the tumor marker test revealed 16.7   08/30/2019 Tumor Marker   Patient's tumor was tested for the following markers: CA-125. Results of the tumor marker test revealed 13.9   09/15/2019 Echocardiogram    1. Left ventricular ejection fraction, by estimation, is 65 to 70%. The left ventricle has normal function. The left ventricle has no regional wall motion abnormalities. Left ventricular diastolic parameters are consistent with Grade I diastolic dysfunction (impaired relaxation). The average left ventricular global longitudinal strain is 18.1 %. The global longitudinal strain is normal.  2. Right ventricular systolic function is normal. The right ventricular size is normal.  3. The mitral valve is normal in structure. No evidence of mitral valve regurgitation. No evidence of mitral stenosis.  4. The aortic valve is normal in structure. Aortic valve regurgitation is not visualized. No aortic stenosis is present.  5. The inferior vena cava is normal in size with greater than 50% respiratory variability, suggesting right atrial pressure of 3 mmHg.     09/24/2019 Imaging   1. Stable exam. No new or progressive metastatic disease on today's study. 2. Small subcapsular hypodensity in the lateral spleen, new in the interval, but indeterminate. Attention on follow-up recommended. 3. The calcified upper abdominal and groin lymph nodes are stable as are  the scattered calcified and noncalcified peritoneal implants. 4. Stable midline ventral hernia containing a short segment of colon without complicating features. 5. Aortic Atherosclerosis (ICD10-I70.0).     10/04/2019 -  Chemotherapy   Patient is on Treatment Plan : ovarian Bevacizumab q21d     10/04/2019 Tumor Marker   Patient's tumor was tested for the following markers: CA-125. Results of the tumor marker test revealed 12.9.   11/01/2019 Tumor Marker   Patient's tumor was tested for the following markers: CA-125 Results of the tumor marker test revealed 15.4   12/13/2019 Tumor Marker   Patient's tumor was tested for the following markers: CA-125 Results of the tumor marker test revealed 14.8   12/31/2019 Imaging   1. Unchanged tiny peritoneal nodule adjacent to the spleen measuring 6 mm. Other previously noted peritoneal nodules are imperceptible on present examination. 2. Unchanged prominent, partially calcified portacaval lymph node and bilateral inguinal lymph nodes. 3. Unchanged subtle, nonspecific subcapsular lesion of the spleen.  4. No evidence of new metastatic disease in the abdomen or pelvis. 5. Status post hysterectomy.  6. Unchanged low midline ventral abdominal hernia containing a single loop of nonobstructed transverse colon. 7. Cholelithiasis. 8. Aortic Atherosclerosis (ICD10-I70.0).       01/03/2020 Tumor Marker   Patient's tumor was tested for the following markers: CA-125 Results of the tumor marker test revealed 11.7.   01/24/2020 Tumor Marker   Patient's tumor was tested for the following markers: CA-125. Results of the tumor marker test revealed 15.1   03/06/2020 Tumor Marker   Patient's tumor was tested for the following markers: CA-125. Results of the tumor marker test revealed 20.3   03/27/2020 Tumor Marker   Patient's tumor was tested for the following markers: CA-125 Results of the tumor marker test revealed 23.7   05/11/2020 Tumor Marker    Patient's tumor was tested for the following markers: CA-125. Results of the tumor marker test revealed 25.4   06/23/2020 Imaging   1. Cholelithiasis with gallbladder wall thickening and mild infiltrative edema in the porta hepatis, raising the possibility of acute cholecystitis. There is truncation of the common bile duct distally and distal choledocholithiasis is difficult to exclude. Correlate with bilirubin levels. If clinically warranted, MRCP could be utilized for further characterization. 2. Stable tiny perisplenic nodule. 3. Other imaging findings of potential clinical significance: Small type 1 hiatal hernia. Prominent stool throughout the colon favors constipation. Ventral infraumbilical hernia contains transverse colon without findings of strangulation or obstruction. Small supraumbilical hernia contains adipose tissue. Lumbar degenerative disc disease at L5-S1. Stable tiny nodule along the lateral margin of the spleen, nonspecific. 4. Aortic atherosclerosis.     12/15/2020 Imaging   No evidence of recurrent or metastatic carcinoma within the abdomen or pelvis.   Stable ventral hernia containing transverse colon. No evidence of bowel obstruction or strangulation.   Cholelithiasis. No radiographic evidence of cholecystitis.   Tiny hiatal hernia.   Aortic Atherosclerosis (ICD10-I70.0).       PHYSICAL EXAMINATION: ECOG PERFORMANCE STATUS: 0 - Asymptomatic  Vitals:   04/30/21 0826  BP: (!) 115/59  Pulse: 81  Resp: 18  Temp: (!) 97.4 F (36.3 C)  SpO2: 98%   Filed Weights   04/30/21 0826  Weight: 155 lb 9.6 oz (70.6 kg)    GENERAL:alert, no distress and comfortable NEURO: alert & oriented x 3 with fluent speech, no focal motor/sensory deficits  LABORATORY DATA:  I have reviewed the data as listed    Component Value Date/Time   NA 137 04/30/2021 0810   NA 139 04/14/2017 0742   K 4.6 04/30/2021 0810   K 4.0 04/14/2017 0742   CL 105 04/30/2021 0810   CO2 24  04/30/2021 0810   CO2 21 (L) 04/14/2017 0742   GLUCOSE 101 (H) 04/30/2021 0810   GLUCOSE 247 (H) 04/14/2017 0742   BUN 25 (H) 04/30/2021 0810   BUN 13.1 04/14/2017 0742   CREATININE 1.27 (H) 04/30/2021 0810   CREATININE 0.99 03/12/2021 0832   CREATININE 0.9 04/14/2017 0742   CALCIUM 9.0 04/30/2021 0810   CALCIUM 9.2 04/14/2017 0742   PROT 6.6 04/30/2021 0810   PROT 7.3 04/14/2017 0742   ALBUMIN 3.6 04/30/2021 0810   ALBUMIN 3.9 04/14/2017 0742   AST 12 (L) 04/30/2021 0810   AST 18 03/12/2021 0832   AST 17 04/14/2017 0742   ALT 6 04/30/2021 0810   ALT 9 03/12/2021 0832   ALT 14 04/14/2017 0742   ALKPHOS 57 04/30/2021 0810   ALKPHOS 70 04/14/2017 0742   BILITOT 0.5 04/30/2021 0810   BILITOT 0.8 03/12/2021 9924  BILITOT 0.37 04/14/2017 0742   GFRNONAA 47 (L) 04/30/2021 0810   GFRNONAA >60 03/12/2021 0832   GFRAA >60 01/24/2020 0924   GFRAA >60 07/19/2019 0951    No results found for: SPEP, UPEP  Lab Results  Component Value Date   WBC 7.4 04/30/2021   NEUTROABS 4.7 04/30/2021   HGB 12.5 04/30/2021   HCT 39.1 04/30/2021   MCV 94.7 04/30/2021   PLT 231 04/30/2021      Chemistry      Component Value Date/Time   NA 137 04/30/2021 0810   NA 139 04/14/2017 0742   K 4.6 04/30/2021 0810   K 4.0 04/14/2017 0742   CL 105 04/30/2021 0810   CO2 24 04/30/2021 0810   CO2 21 (L) 04/14/2017 0742   BUN 25 (H) 04/30/2021 0810   BUN 13.1 04/14/2017 0742   CREATININE 1.27 (H) 04/30/2021 0810   CREATININE 0.99 03/12/2021 0832   CREATININE 0.9 04/14/2017 0742      Component Value Date/Time   CALCIUM 9.0 04/30/2021 0810   CALCIUM 9.2 04/14/2017 0742   ALKPHOS 57 04/30/2021 0810   ALKPHOS 70 04/14/2017 0742   AST 12 (L) 04/30/2021 0810   AST 18 03/12/2021 0832   AST 17 04/14/2017 0742   ALT 6 04/30/2021 0810   ALT 9 03/12/2021 0832   ALT 14 04/14/2017 0742   BILITOT 0.5 04/30/2021 0810   BILITOT 0.8 03/12/2021 0832   BILITOT 0.37 04/14/2017 2197

## 2021-04-30 NOTE — Assessment & Plan Note (Signed)
Her blood pressure is better controlled with the addition of lisinopril However, since her recent weight loss, her blood pressure is now trending low She has no other symptoms We will proceed with treatment as scheduled She has no evidence of disease on CT imaging The role of treatment moving forward is of maintenance in nature I recommend spacing out interval between CT imaging Her next imaging study would be in February 2023

## 2021-05-21 ENCOUNTER — Other Ambulatory Visit: Payer: Self-pay

## 2021-05-21 ENCOUNTER — Inpatient Hospital Stay: Payer: Medicare Other

## 2021-05-21 VITALS — BP 123/65 | HR 67 | Temp 97.8°F

## 2021-05-21 DIAGNOSIS — Z5112 Encounter for antineoplastic immunotherapy: Secondary | ICD-10-CM | POA: Diagnosis not present

## 2021-05-21 DIAGNOSIS — Z7189 Other specified counseling: Secondary | ICD-10-CM

## 2021-05-21 DIAGNOSIS — C5701 Malignant neoplasm of right fallopian tube: Secondary | ICD-10-CM

## 2021-05-21 LAB — COMPREHENSIVE METABOLIC PANEL
ALT: 6 U/L (ref 0–44)
AST: 14 U/L — ABNORMAL LOW (ref 15–41)
Albumin: 3.7 g/dL (ref 3.5–5.0)
Alkaline Phosphatase: 54 U/L (ref 38–126)
Anion gap: 8 (ref 5–15)
BUN: 20 mg/dL (ref 8–23)
CO2: 24 mmol/L (ref 22–32)
Calcium: 9.1 mg/dL (ref 8.9–10.3)
Chloride: 107 mmol/L (ref 98–111)
Creatinine, Ser: 1.19 mg/dL — ABNORMAL HIGH (ref 0.44–1.00)
GFR, Estimated: 50 mL/min — ABNORMAL LOW (ref >60)
Glucose, Bld: 104 mg/dL — ABNORMAL HIGH (ref 70–99)
Potassium: 4.6 mmol/L (ref 3.5–5.1)
Sodium: 139 mmol/L (ref 135–145)
Total Bilirubin: 0.6 mg/dL (ref 0.3–1.2)
Total Protein: 6.4 g/dL — ABNORMAL LOW (ref 6.5–8.1)

## 2021-05-21 LAB — TOTAL PROTEIN, URINE DIPSTICK: Protein, ur: NEGATIVE mg/dL

## 2021-05-21 LAB — CBC WITH DIFFERENTIAL/PLATELET
Abs Immature Granulocytes: 0.03 10*3/uL (ref 0.00–0.07)
Basophils Absolute: 0.1 10*3/uL (ref 0.0–0.1)
Basophils Relative: 1 %
Eosinophils Absolute: 0.7 10*3/uL — ABNORMAL HIGH (ref 0.0–0.5)
Eosinophils Relative: 10 %
HCT: 37.7 % (ref 36.0–46.0)
Hemoglobin: 12 g/dL (ref 12.0–15.0)
Immature Granulocytes: 1 %
Lymphocytes Relative: 22 %
Lymphs Abs: 1.4 10*3/uL (ref 0.7–4.0)
MCH: 30 pg (ref 26.0–34.0)
MCHC: 31.8 g/dL (ref 30.0–36.0)
MCV: 94.3 fL (ref 80.0–100.0)
Monocytes Absolute: 0.5 10*3/uL (ref 0.1–1.0)
Monocytes Relative: 8 %
Neutro Abs: 3.7 10*3/uL (ref 1.7–7.7)
Neutrophils Relative %: 58 %
Platelets: 205 10*3/uL (ref 150–400)
RBC: 4 MIL/uL (ref 3.87–5.11)
RDW: 14.2 % (ref 11.5–15.5)
WBC: 6.3 10*3/uL (ref 4.0–10.5)
nRBC: 0 % (ref 0.0–0.2)

## 2021-05-21 MED ORDER — SODIUM CHLORIDE 0.9 % IV SOLN: Freq: Once | INTRAVENOUS | Status: AC

## 2021-05-21 MED ORDER — SODIUM CHLORIDE 0.9% FLUSH
10.0000 mL | Freq: Once | INTRAVENOUS | Status: AC
  Administered 2021-05-21: 10 mL

## 2021-05-21 MED ORDER — SODIUM CHLORIDE 0.9 % IV SOLN
10.0000 mg/kg | Freq: Once | INTRAVENOUS | Status: AC
  Administered 2021-05-21: 700 mg via INTRAVENOUS
  Filled 2021-05-21: qty 16

## 2021-05-21 MED ORDER — SODIUM CHLORIDE 0.9% FLUSH
10.0000 mL | INTRAVENOUS | Status: DC | PRN
  Administered 2021-05-21: 10 mL

## 2021-05-21 MED ORDER — HEPARIN SOD (PORK) LOCK FLUSH 100 UNIT/ML IV SOLN
500.0000 [IU] | Freq: Once | INTRAVENOUS | Status: AC | PRN
  Administered 2021-05-21: 500 [IU]

## 2021-05-21 NOTE — Patient Instructions (Signed)
[facility]  Discharge Instructions: °Thank you for choosing [facility] to provide your oncology and hematology care.  ° °If you have a lab appointment with the Cancer Center, please go directly to the Cancer Center and check in at the registration area. °  °Wear comfortable clothing and clothing appropriate for easy access to any Portacath or PICC line.  ° °We strive to give you quality time with your provider. You may need to reschedule your appointment if you arrive late (15 or more minutes).  Arriving late affects you and other patients whose appointments are after yours.  Also, if you miss three or more appointments without notifying the office, you may be dismissed from the clinic at the provider’s discretion.    °  °For prescription refill requests, have your pharmacy contact our office and allow 72 hours for refills to be completed.   ° °Today you received the following chemotherapy and/or immunotherapy agents: Bevacizumab.     °  °To help prevent nausea and vomiting after your treatment, we encourage you to take your nausea medication as directed. ° °BELOW ARE SYMPTOMS THAT SHOULD BE REPORTED IMMEDIATELY: °*FEVER GREATER THAN 100.4 F (38 °C) OR HIGHER °*CHILLS OR SWEATING °*NAUSEA AND VOMITING THAT IS NOT CONTROLLED WITH YOUR NAUSEA MEDICATION °*UNUSUAL SHORTNESS OF BREATH °*UNUSUAL BRUISING OR BLEEDING °*URINARY PROBLEMS (pain or burning when urinating, or frequent urination) °*BOWEL PROBLEMS (unusual diarrhea, constipation, pain near the anus) °TENDERNESS IN MOUTH AND THROAT WITH OR WITHOUT PRESENCE OF ULCERS (sore throat, sores in mouth, or a toothache) °UNUSUAL RASH, SWELLING OR PAIN  °UNUSUAL VAGINAL DISCHARGE OR ITCHING  ° °Items with * indicate a potential emergency and should be followed up as soon as possible or go to the Emergency Department if any problems should occur. ° °Please show the CHEMOTHERAPY ALERT CARD or IMMUNOTHERAPY ALERT CARD at check-in  to the Emergency Department and triage nurse. ° °Should you have questions after your visit or need to cancel or reschedule your appointment, please contact [facility]  Dept: [phone]  and follow the prompts.  Office hours are 8:00 a.m. to 4:30 p.m. Monday - Friday. Please note that voicemails left after 4:00 p.m. may not be returned until the following business day.  We are closed weekends and major holidays. You have access to a nurse at all times for urgent questions. Please call the main number to the clinic Dept: [phone] and follow the prompts. ° ° °For any non-urgent questions, you may also contact your provider using MyChart. We now offer e-Visits for anyone 18 and older to request care online for non-urgent symptoms. For details visit [email or web address]. °  °Also download the MyChart app! Go to the app store, search "MyChart", open the app, select , and log in with your MyChart username and password. ° °Due to Covid, a mask is required upon entering the hospital/clinic. If you do not have a mask, one will be given to you upon arrival. For doctor visits, patients may have 1 support person aged 18 or older with them. For treatment visits, patients cannot have anyone with them due to current Covid guidelines and our immunocompromised population.  ° °

## 2021-06-08 ENCOUNTER — Ambulatory Visit (HOSPITAL_COMMUNITY)
Admission: RE | Admit: 2021-06-08 | Discharge: 2021-06-08 | Disposition: A | Discharge status: Home / Self Care | Payer: Medicare Other | Source: Ambulatory Visit | Attending: Hematology and Oncology | Admitting: Hematology and Oncology

## 2021-06-08 ENCOUNTER — Other Ambulatory Visit: Payer: Self-pay

## 2021-06-08 ENCOUNTER — Inpatient Hospital Stay: Payer: Medicare Other | Attending: Hematology and Oncology

## 2021-06-08 DIAGNOSIS — C5701 Malignant neoplasm of right fallopian tube: Secondary | ICD-10-CM | POA: Insufficient documentation

## 2021-06-08 DIAGNOSIS — Z5112 Encounter for antineoplastic immunotherapy: Secondary | ICD-10-CM | POA: Diagnosis not present

## 2021-06-08 DIAGNOSIS — Z79899 Other long term (current) drug therapy: Secondary | ICD-10-CM | POA: Diagnosis not present

## 2021-06-08 DIAGNOSIS — J984 Other disorders of lung: Secondary | ICD-10-CM | POA: Insufficient documentation

## 2021-06-08 DIAGNOSIS — Z7189 Other specified counseling: Secondary | ICD-10-CM

## 2021-06-08 LAB — COMPREHENSIVE METABOLIC PANEL
ALT: 7 U/L (ref 0–44)
AST: 15 U/L (ref 15–41)
Albumin: 3.7 g/dL (ref 3.5–5.0)
Alkaline Phosphatase: 50 U/L (ref 38–126)
Anion gap: 6 (ref 5–15)
BUN: 22 mg/dL (ref 8–23)
CO2: 26 mmol/L (ref 22–32)
Calcium: 9.1 mg/dL (ref 8.9–10.3)
Chloride: 106 mmol/L (ref 98–111)
Creatinine, Ser: 1.18 mg/dL — ABNORMAL HIGH (ref 0.44–1.00)
GFR, Estimated: 51 mL/min — ABNORMAL LOW (ref >60)
Glucose, Bld: 87 mg/dL (ref 70–99)
Potassium: 5 mmol/L (ref 3.5–5.1)
Sodium: 138 mmol/L (ref 135–145)
Total Bilirubin: 0.5 mg/dL (ref 0.3–1.2)
Total Protein: 6.2 g/dL — ABNORMAL LOW (ref 6.5–8.1)

## 2021-06-08 LAB — CBC WITH DIFFERENTIAL/PLATELET
Abs Immature Granulocytes: 0.02 10*3/uL (ref 0.00–0.07)
Basophils Absolute: 0.1 10*3/uL (ref 0.0–0.1)
Basophils Relative: 1 %
Eosinophils Absolute: 0.6 10*3/uL — ABNORMAL HIGH (ref 0.0–0.5)
Eosinophils Relative: 10 %
HCT: 38.4 % (ref 36.0–46.0)
Hemoglobin: 12.3 g/dL (ref 12.0–15.0)
Immature Granulocytes: 0 %
Lymphocytes Relative: 21 %
Lymphs Abs: 1.3 10*3/uL (ref 0.7–4.0)
MCH: 30.1 pg (ref 26.0–34.0)
MCHC: 32 g/dL (ref 30.0–36.0)
MCV: 94.1 fL (ref 80.0–100.0)
Monocytes Absolute: 0.6 10*3/uL (ref 0.1–1.0)
Monocytes Relative: 9 %
Neutro Abs: 3.7 10*3/uL (ref 1.7–7.7)
Neutrophils Relative %: 59 %
Platelets: 192 10*3/uL (ref 150–400)
RBC: 4.08 MIL/uL (ref 3.87–5.11)
RDW: 14.7 % (ref 11.5–15.5)
WBC: 6.3 10*3/uL (ref 4.0–10.5)
nRBC: 0 % (ref 0.0–0.2)

## 2021-06-08 LAB — TOTAL PROTEIN, URINE DIPSTICK: Protein, ur: <30 mg/dL — AB

## 2021-06-08 MED ORDER — IOHEXOL 300 MG/ML  SOLN
100.0000 mL | Freq: Once | INTRAMUSCULAR | Status: AC | PRN
  Administered 2021-06-08: 100 mL via INTRAVENOUS

## 2021-06-08 MED ORDER — SODIUM CHLORIDE (PF) 0.9 % IJ SOLN
INTRAMUSCULAR | Status: AC
  Filled 2021-06-08: qty 50

## 2021-06-08 MED ORDER — HEPARIN SOD (PORK) LOCK FLUSH 100 UNIT/ML IV SOLN
500.0000 [IU] | Freq: Once | INTRAVENOUS | Status: AC
  Administered 2021-06-08: 500 [IU] via INTRAVENOUS

## 2021-06-08 MED ORDER — SODIUM CHLORIDE 0.9% FLUSH
10.0000 mL | Freq: Once | INTRAVENOUS | Status: AC
  Administered 2021-06-08: 10 mL

## 2021-06-11 ENCOUNTER — Inpatient Hospital Stay (HOSPITAL_BASED_OUTPATIENT_CLINIC_OR_DEPARTMENT_OTHER): Payer: Medicare Other | Admitting: Hematology and Oncology

## 2021-06-11 ENCOUNTER — Inpatient Hospital Stay: Payer: Medicare Other

## 2021-06-11 ENCOUNTER — Encounter: Payer: Self-pay | Admitting: Hematology and Oncology

## 2021-06-11 ENCOUNTER — Other Ambulatory Visit: Payer: Self-pay

## 2021-06-11 DIAGNOSIS — C5701 Malignant neoplasm of right fallopian tube: Secondary | ICD-10-CM | POA: Diagnosis not present

## 2021-06-11 DIAGNOSIS — J984 Other disorders of lung: Secondary | ICD-10-CM | POA: Diagnosis not present

## 2021-06-11 DIAGNOSIS — I10 Essential (primary) hypertension: Secondary | ICD-10-CM

## 2021-06-11 DIAGNOSIS — K439 Ventral hernia without obstruction or gangrene: Secondary | ICD-10-CM

## 2021-06-11 DIAGNOSIS — Z5112 Encounter for antineoplastic immunotherapy: Secondary | ICD-10-CM | POA: Diagnosis not present

## 2021-06-11 DIAGNOSIS — Z7189 Other specified counseling: Secondary | ICD-10-CM

## 2021-06-11 MED ORDER — SODIUM CHLORIDE 0.9 % IV SOLN: Freq: Once | INTRAVENOUS | Status: AC

## 2021-06-11 MED ORDER — SODIUM CHLORIDE 0.9 % IV SOLN
10.0000 mg/kg | Freq: Once | INTRAVENOUS | Status: AC
  Administered 2021-06-11: 700 mg via INTRAVENOUS
  Filled 2021-06-11: qty 16

## 2021-06-11 MED ORDER — SODIUM CHLORIDE 0.9% FLUSH
10.0000 mL | INTRAVENOUS | Status: DC | PRN
  Administered 2021-06-11: 10 mL

## 2021-06-11 MED ORDER — HEPARIN SOD (PORK) LOCK FLUSH 100 UNIT/ML IV SOLN
500.0000 [IU] | Freq: Once | INTRAVENOUS | Status: AC | PRN
  Administered 2021-06-11: 500 [IU]

## 2021-06-11 NOTE — Assessment & Plan Note (Signed)
The cause is unknown Observe only I plan to repeat CT imaging of the chest in May

## 2021-06-11 NOTE — Assessment & Plan Note (Signed)
I have reviewed multiple imaging studies with the patient and her husband I am not concerned about the cavitary lesion seen Overall, she tolerated bevacizumab very well Plan to space out her imaging studies In terms of her abdomen and pelvis, I would delay the next imaging for at least 6 months, around August In terms of her lung imaging, I will consider repeating CT of the chest in 3 months around May We will proceed with treatment without delay

## 2021-06-11 NOTE — Progress Notes (Signed)
[facility] OFFICE PROGRESS NOTE  Patient Care Team: Gaynelle Arabian, MD as PCP - General (Family Medicine) Heath Lark, MD as Consulting Physician (Hematology and Oncology) Everitt Amber, MD as Consulting Physician (Obstetrics and Gynecology)  ASSESSMENT & PLAN:  Adenocarcinoma of right fallopian tube [facility]) I have reviewed multiple imaging studies with the patient and her husband I am not concerned about the cavitary lesion seen Overall, she tolerated bevacizumab very well Plan to space out her imaging studies In terms of her abdomen and pelvis, I would delay the next imaging for at least 6 months, around August In terms of her lung imaging, I will consider repeating CT of the chest in 3 months around May We will proceed with treatment without delay  Abdominal hernia without obstruction and without gangrene She is not symptomatic from the hernia perspective I do not recommend repair of her hernia  Cavitary lesion of lung The cause is unknown Observe only I plan to repeat CT imaging of the chest in May  Essential hypertension She has excellent blood pressure control She will continue close monitoring of blood pressure  Orders Placed This Encounter  Procedures   Total Protein, Urine dipstick    Standing Status:   Standing    Number of Occurrences:   22    Standing Expiration Date:   06/11/2022    All questions were answered. The patient knows to call the clinic with any problems, questions or concerns. The total time spent in the appointment was 30 minutes encounter with patients including review of chart and various tests results, discussions about plan of care and coordination of care plan   Heath Lark, MD 06/11/2021 9:27 AM  INTERVAL HISTORY: Please see below for problem oriented charting. she returns for treatment follow-up with her husband She is doing well Denies recent abdominal pain or changes in bowel habits She brought with her documented blood  pressure from home which were within normal range  REVIEW OF SYSTEMS:   Constitutional: Denies fevers, chills or abnormal weight loss Eyes: Denies blurriness of vision Ears, nose, mouth, throat, and face: Denies mucositis or sore throat Respiratory: Denies cough, dyspnea or wheezes Cardiovascular: Denies palpitation, chest discomfort or lower extremity swelling Gastrointestinal:  Denies nausea, heartburn or change in bowel habits Skin: Denies abnormal skin rashes Lymphatics: Denies new lymphadenopathy or easy bruising Neurological:Denies numbness, tingling or new weaknesses Behavioral/Psych: Mood is stable, no new changes  All other systems were reviewed with the patient and are negative.  I have reviewed the past medical history, past surgical history, social history and family history with the patient and they are unchanged from previous note.  ALLERGIES:  is allergic to dilaudid [hydromorphone hcl], morphine and related, and sudafed [pseudoephedrine hcl].  MEDICATIONS:  Current Outpatient Medications  Medication Sig Dispense Refill   acetaminophen (TYLENOL) 325 MG tablet Take 975 mg by mouth every 6 (six) hours as needed (pain).     amLODipine (NORVASC) 10 MG tablet TAKE 1 TABLET(10 MG) BY MOUTH DAILY 30 tablet 9   lidocaine-prilocaine (EMLA) cream Apply to affected area once daily as directed. 30 g 3   lisinopril (ZESTRIL) 20 MG tablet TAKE 1 TABLET(20 MG) BY MOUTH DAILY 90 tablet 3   ondansetron (ZOFRAN) 8 MG tablet Take 1 tablet (8 mg total) by mouth every 8 (eight) hours as needed. (Patient not taking: Reported on 05/21/2021) 30 tablet 1   prochlorperazine (COMPAZINE) 10 MG tablet TAKE 1 TABLET BY MOUTH EVERY 6 HOURS AS NEEDED FOR  NAUSEA AND/OR VOMITING (Patient not taking: Reported on 05/21/2021) 30 tablet 1   venlafaxine XR (EFFEXOR-XR) 37.5 MG 24 hr capsule TAKE 1 CAPSULE BY MOUTH DAILY WITH BREAKFAST 90 capsule 3   No current facility-administered medications for this visit.    Facility-Administered Medications Ordered in Other Visits  Medication Dose Route Frequency Provider Last Rate Last Admin   bevacizumab-bvzr (ZIRABEV) 700 mg in sodium chloride 0.9 % 100 mL chemo infusion  10 mg/kg (Treatment Plan Recorded) Intravenous Once Alvy Bimler, Tajana Crotteau, MD       heparin lock flush 100 unit/mL  500 Units Intracatheter Once PRN Alvy Bimler, Seriah Brotzman, MD       sodium chloride flush (NS) 0.9 % injection 10 mL  10 mL Intravenous PRN Alvy Bimler, Romaine Maciolek, MD   10 mL at 02/11/17 0839   sodium chloride flush (NS) 0.9 % injection 10 mL  10 mL Intravenous PRN Alvy Bimler, Jarron Curley, MD   10 mL at 03/04/17 1510   sodium chloride flush (NS) 0.9 % injection 10 mL  10 mL Intracatheter PRN Heath Lark, MD        SUMMARY OF ONCOLOGIC HISTORY: Oncology History Overview Note  Neg genetics   Adenocarcinoma of right fallopian tube ([place])  08/12/2016 Initial Diagnosis   The patient has many months of vague symptoms of fullness in the pelvis, urinary frequency and difficulty with defecation. She denies vaginal bleeding or rectal bleeding.     08/12/2016 Imaging   Abdomen X-ray in the ER: Moderate colonic stool burden without evidence of enteric obstruction   11/13/2016 Imaging   TVUS was performed on 11/13/16 which showed a large hypoechoic lobular mass seen posterior to the uterus measuring 18.2x16.1x11.8cm, blood flow was seen along the periphery of the mass. It was considered to be likely to be a fibroid vs pelvic mass vs ovarian mass. Neither ovary was removed. Moderate amount of free fluid in the pelvis. THe uterus measures 4x10x4cm. The endometrium is 58m.    12/05/2016 Imaging   MR pelvis 1. Mild limitations as detailed above. 2. Heterogeneous pelvic mass is favored to arise from the right ovary. Favor a solid ovarian neoplasm such as fibroma/Brenner's tumor. Given lesion size and heterogeneous T2 signal out, complicating torsion cannot be excluded. 3. Moderate abdominopelvic ascites, without specific evidence of  peritoneal metastasis. Consider further evaluation with contrast-enhanced abdominopelvic CT.    12/26/2016 Pathology Results   1. Uterus, ovaries and fallopian tubes - ADENOCARCINOMA INVOLVING RIGHT FALLOPIAN TUBE, RIGHT AND LEFT OVARIES, UTERINE SEROSA AND PELVIC MASS. - SEE ONCOLOGY TABLE AND COMMENT. - UTERINE CERVIX, ENDOMETRIUM, MYOMETRIUM AND LEFT FALLOPIAN TUBE FREE OF TUMOR. 2. Omentum, resection for tumor - ADENOCARCINOMA. Microscopic Comment 1. ONCOLOGY TABLE - FALLOPIAN TUBE. SEE COMMENT 1. Specimen, including laterality: Uterus, bilateral adnexa, pelvic mass and omentum. 2. Procedure: Hysterectomy with bilateral salpingo-oophorectomy, pelvic mass excision and omentectomy. 3. Lymph node sampling performed: No 4. Tumor site: See comment. 5. Tumor location in fallopian tube: Right fallopian tube fimbria 6. Specimen integrity (intact/ruptured/disrupted): Intact 7. Tumor size (cm): 18 cm, see comment. 8. Histologic type: Adenocarcinoma 9. Grade: High grade 10. Microscopic tumor extension: Tumor involves right fallopian tube, right and left ovaries, uterine serosa, pelvic mass and omentum. 11. Margins: See comment. 12. Lymph-Vascular invasion: Present 13. Lymph nodes: # examined: 0; # positive: N/A 14. TNM: pT3c, pNX 15. FIGO Stage (based on pathologic findings, needs clinical correlation: III-C 16. Comments: There is an 18 cm pelvic mass which is a high grade adenocarcinoma and there is a 12.2 cm  segment of omentum which is extensively involved with adenocarcinoma. The tumor also involves the fimbria of the right fallopian tube and the parenchyma of the right and left ovaries as well as uterine serosa. The fimbria of the right fallopian has intraepithelial atypia consistent with a precursor lesion and therefore this is most consistent with primary fallopian tube adenocarcinoma. A primary peritoneal serous carcinoma is a less likely possibility.    12/26/2016 Pathology Results    PERITONEAL/ASCITIC FLUID(SPECIMEN 1 OF 1 COLLECTED 12/26/16): POORLY DIFFERENTIATED ADENOCARCINOMA   12/26/2016 Surgery   Procedure(s) Performed: Exploratory laparotomy with total abdominal hysterectomy, bilateral salpingo-oophorectomy, omentectomy radical tumor debulking for ovarian cancer .   Surgeon: Thereasa Solo, MD.      Operative Findings: 20cm left ovarian mass densely adherent to the posterior uterus, right tube and ovary, cervix, sigmoid colon. 10cm omental cake. 4L ascites. 24m size tumor nodules on the serosa of the terminal ileum and proximal sigmoid colon. 14mnodules on right diaphragm.    This represented an optimal cytoreduction (R1) with 70m53modules on intestine and diaphragm representing gross visible disease   01/16/2017 Procedure   Placement of single lumen port a cath via right internal jugular vein. The catheter tip lies at the cavo-atrial junction. A power injectable port a cath was placed and is ready for immediate use.   01/17/2017 Imaging   1. 3.5 x 7.2 x 4.6 cm fluid collection along the vaginal cuff, posterior the bladder and extending into the right adnexal space. This lesion demonstrates rim enhancement. Imaging features could be related to a loculated postoperative seroma or hematoma. Superinfection cannot be excluded by CT. Given debulking surgery was 3 weeks ago, this entire structure is un likely to represent neoplasm, but peritoneal involvement could have this appearance. 2. Small fluid collection in the left para colic gutter without rim enhancement. 3. Irregular/nodular appearance of the peritoneal M in the anatomic pelvis with areas of subtle nodularity between the stomach and the spleen. Close attention in these regions on follow-up recommended as metastatic disease is a concern. 4. Mildly enlarged hepatoduodenal ligament lymph node. Attention on follow-up recommended.   01/20/2017 Tumor Marker   Patient's tumor was tested for the following markers:  CA-125 Results of the tumor marker test revealed 46.6   01/28/2017 Genetic Testing       Negative genetic [facility].  The [facility] panel offered by [employer] sequencing and deletion/duplication testing of the following 28 genes: APC, ATM, BARD1, BMPR1A, BRCA1, BRCA2, BRIP1, CHD1, CDK4, CDKN2A, CHEK2, EPCAM (large rearrangement only), MLH1, MSH2, MSH6, MUTYH, NBN, PALB2, PMS2, PTEN, RAD51C, RAD51D, SMAD4, STK11, and TP53. Sequencing was performed for select regions of POLE and POLD1, and large rearrangement analysis was performed for select regions of GREM1. The report date is January 28, 2017.  HRD testing looking for genomic instability and BRCA mutations was negative.  The report date of this test is January 27, 2017.    01/31/2017 Tumor Marker   Patient's tumor was tested for the following markers: CA-125 Results of the tumor marker test revealed 22.4   03/04/2017 Tumor Marker   Patient's tumor was tested for the following markers: CA-125 Results of the tumor marker test revealed 13.8   04/18/2017 Tumor Marker   Patient's tumor was tested for the following markers: CA-125 Results of the tumor marker test revealed 11.9   05/05/2017 Tumor Marker   Patient's tumor was tested for the following markers: CA-125 Results of the tumor  marker test revealed 12   05/26/2017 Tumor Marker   Patient's tumor was tested for the following markers: CA-125 Results of the tumor marker test revealed 10.6   05/26/2017 Imaging   1. A previously noted postoperative fluid collection in the low pelvis and residual ascites seen on the prior examination has resolved on today's study. No findings to suggest residual/recurrent disease on today's examination. No definite solid organ metastasis identified in the abdomen or pelvis. No lymphadenopathy. 2. Aortic atherosclerosis.   06/10/2017 Procedure   Successful right IJ vein Port-A-Cath explant.   03/09/2018  Tumor Marker   Patient's tumor was tested for the following markers: CA-125 Results of the tumor marker test revealed 12.1   05/26/2018 Tumor Marker   Patient's tumor was tested for the following markers: CA-125 Results of the tumor marker test revealed 13.8   09/09/2018 Tumor Marker   Patient's tumor was tested for the following markers: CA-125 Results of the tumor marker test revealed 23.9   12/18/2018 Tumor Marker   Patient's tumor was tested for the following markers: CA-125 Results of the tumor marker test revealed 43.8   02/27/2019 - 02/28/2019 Hospital Admission   She was admitted for bowel obstruction   02/27/2019 Imaging   1. High-grade small bowel obstruction with transition point in the pelvis in an area of suspected desmoplastic reaction surrounding a 1.3 cm spiculated nodule in the mesentery, suspicious for carcinoid. There is hyperenhancement near the tip of the appendix and loss of the normal fat plane between it and the adjacent sigmoid colon, potentially the site of primary lesion. 2. Multiple new small subcentimeter hyperdense lesions scattered along the liver capsule, concerning for metastases. Enlarged hyperenhancing gastrohepatic and portacaval lymph nodes concerning for nodal metastases. 3. Very mild right hydroureteronephrosis to the level of the desmoplastic reaction in the pelvis, potentially involving the right ureter. 4. Infraumbilical ventral abdominal wall diastasis containing a small nondilated portion of transverse colon. 5. Trace ascites. 6. Cholelithiasis. 7.  Aortic atherosclerosis (ICD10-I70.0).     03/05/2019 Tumor Marker   Patient's tumor was tested for the following markers: CA-125 Results of the tumor marker test revealed 70.1   03/12/2019 Echocardiogram   IMPRESSIONS     1. Left ventricular ejection fraction, by visual estimation, is 60 to 65%. The left ventricle has normal function. There is no left ventricular hypertrophy.  2. Left ventricular  diastolic parameters are consistent with Grade I diastolic dysfunction (impaired relaxation).  3. Global right ventricle has normal systolic function.The right ventricular size is normal. No increase in right ventricular wall thickness.  4. Left atrial size was normal.  5. Right atrial size was normal.  6. The pericardium was not assessed.  7. The mitral valve is normal in structure. No evidence of mitral valve regurgitation.  8. The tricuspid valve is normal in structure. Tricuspid valve regurgitation is trivial.  9. The aortic valve is normal in structure. Aortic valve regurgitation is not visualized. 10. The pulmonic valve was grossly normal. Pulmonic valve regurgitation is not visualized. 11. The average left ventricular global longitudinal strain is -17.6 %.   03/16/2019 Procedure   Successful placement of a right internal jugular approach power injectable Port-A-Cath. The catheter is ready for immediate use.     04/19/2019 Tumor Marker   Patient's tumor was tested for the following markers: CA-125 Results of the tumor marker test revealed 39.8   05/17/2019 Tumor Marker   Patient's tumor was tested for the following markers: CA-125 Results of the tumor marker  test revealed 27   05/28/2019 Tumor Marker   Patient's tumor was tested for the following markers: CA-125 Results of the tumor marker test revealed 27.7.   06/11/2019 Imaging   1. No new or progressive metastatic disease in the abdomen or pelvis. 2. Tiny noncalcified perisplenic implant is decreased. Calcified pericardiophrenic, retroperitoneal and bilateral inguinal lymph nodes and scattered small calcified perihepatic and perisplenic implants are stable. 3.  Aortic Atherosclerosis (ICD10-I70.0).       06/17/2019 Echocardiogram    1. Left ventricular ejection fraction, by estimation, is 65 to 70%. The left ventricle has normal function. The left ventricle has no regional wall motion abnormalities. Left ventricular diastolic  parameters were normal.  2. Right ventricular systolic function is normal. The right ventricular size is normal.  3. The mitral valve is normal in structure and function. No evidence of mitral valve regurgitation. No evidence of mitral stenosis.  4. The aortic valve is normal in structure and function. Aortic valve regurgitation is not visualized. No aortic stenosis is present.   06/21/2019 Tumor Marker   Patient's tumor was tested for the following markers: CA-125 Results of the tumor marker test revealed 18.7   07/19/2019 Tumor Marker   Patient's tumor was tested for the following markers: CA-125 Results of the tumor marker test revealed 16.7   08/30/2019 Tumor Marker   Patient's tumor was tested for the following markers: CA-125. Results of the tumor marker test revealed 13.9   09/15/2019 Echocardiogram    1. Left ventricular ejection fraction, by estimation, is 65 to 70%. The left ventricle has normal function. The left ventricle has no regional wall motion abnormalities. Left ventricular diastolic parameters are consistent with Grade I diastolic dysfunction (impaired relaxation). The average left ventricular global longitudinal strain is 18.1 %. The global longitudinal strain is normal.  2. Right ventricular systolic function is normal. The right ventricular size is normal.  3. The mitral valve is normal in structure. No evidence of mitral valve regurgitation. No evidence of mitral stenosis.  4. The aortic valve is normal in structure. Aortic valve regurgitation is not visualized. No aortic stenosis is present.  5. The inferior vena cava is normal in size with greater than 50% respiratory variability, suggesting right atrial pressure of 3 mmHg.     09/24/2019 Imaging   1. Stable exam. No new or progressive metastatic disease on today's study. 2. Small subcapsular hypodensity in the lateral spleen, new in the interval, but indeterminate. Attention on follow-up recommended. 3. The calcified  upper abdominal and groin lymph nodes are stable as are the scattered calcified and noncalcified peritoneal implants. 4. Stable midline ventral hernia containing a short segment of colon without complicating features. 5. Aortic Atherosclerosis (ICD10-I70.0).     10/04/2019 -  Chemotherapy   Patient is on Treatment Plan : ovarian Bevacizumab q21d     10/04/2019 Tumor Marker   Patient's tumor was tested for the following markers: CA-125. Results of the tumor marker test revealed 12.9.   11/01/2019 Tumor Marker   Patient's tumor was tested for the following markers: CA-125 Results of the tumor marker test revealed 15.4   12/13/2019 Tumor Marker   Patient's tumor was tested for the following markers: CA-125 Results of the tumor marker test revealed 14.8   12/31/2019 Imaging   1. Unchanged tiny peritoneal nodule adjacent to the spleen measuring 6 mm. Other previously noted peritoneal nodules are imperceptible on present examination. 2. Unchanged prominent, partially calcified portacaval lymph node and bilateral inguinal lymph nodes. 3.  Unchanged subtle, nonspecific subcapsular lesion of the spleen.  4. No evidence of new metastatic disease in the abdomen or pelvis. 5. Status post hysterectomy. 6. Unchanged low midline ventral abdominal hernia containing a single loop of nonobstructed transverse colon. 7. Cholelithiasis. 8. Aortic Atherosclerosis (ICD10-I70.0).       01/03/2020 Tumor Marker   Patient's tumor was tested for the following markers: CA-125 Results of the tumor marker test revealed 11.7.   01/24/2020 Tumor Marker   Patient's tumor was tested for the following markers: CA-125. Results of the tumor marker test revealed 15.1   03/06/2020 Tumor Marker   Patient's tumor was tested for the following markers: CA-125. Results of the tumor marker test revealed 20.3   03/27/2020 Tumor Marker   Patient's tumor was tested for the following markers: CA-125 Results of the tumor marker  test revealed 23.7   05/11/2020 Tumor Marker   Patient's tumor was tested for the following markers: CA-125. Results of the tumor marker test revealed 25.4   06/23/2020 Imaging   1. Cholelithiasis with gallbladder wall thickening and mild infiltrative edema in the porta hepatis, raising the possibility of acute cholecystitis. There is truncation of the common bile duct distally and distal choledocholithiasis is difficult to exclude. Correlate with bilirubin levels. If clinically warranted, MRCP could be utilized for further characterization. 2. Stable tiny perisplenic nodule. 3. Other imaging findings of potential clinical significance: Small type 1 hiatal hernia. Prominent stool throughout the colon favors constipation. Ventral infraumbilical hernia contains transverse colon without findings of strangulation or obstruction. Small supraumbilical hernia contains adipose tissue. Lumbar degenerative disc disease at L5-S1. Stable tiny nodule along the lateral margin of the spleen, nonspecific. 4. Aortic atherosclerosis.     12/15/2020 Imaging   No evidence of recurrent or metastatic carcinoma within the abdomen or pelvis.   Stable ventral hernia containing transverse colon. No evidence of bowel obstruction or strangulation.   Cholelithiasis. No radiographic evidence of cholecystitis.   Tiny hiatal hernia.   Aortic Atherosclerosis (ICD10-I70.0).     06/11/2021 Imaging   1. Numerous sub 4 mm pulmonary nodules in the lungs some of which have a cavitary appearance. I think it is unlikely this is metastatic disease and more likely a possible immunotherapy related process such as a sarcoid like reaction. Recommend full chest CT further evaluation. 2. Stable small calcified retroperitoneal and mesenteric lymph nodes. No findings suspicious for recurrent peritoneal carcinomatosis. 3. Stable lower abdominal wall hernia containing part of the transverse colon. 4. Cholelithiasis.     PHYSICAL  EXAMINATION: ECOG PERFORMANCE STATUS: 0 - Asymptomatic  Vitals:   06/11/21 0801  BP: 140/68  Pulse: 78  Resp: 18  Temp: 97.6 F (36.4 C)  SpO2: 100%   Filed Weights   06/11/21 0801  Weight: 161 lb (73 kg)    GENERAL:alert, no distress and comfortable SKIN: skin color, texture, turgor are normal, no rashes or significant lesions EYES: normal, Conjunctiva are pink and non-injected, sclera clear OROPHARYNX:no exudate, no erythema and lips, buccal mucosa, and tongue normal  NECK: supple, thyroid normal size, non-tender, without nodularity LYMPH:  no palpable lymphadenopathy in the cervical, axillary or inguinal LUNGS: clear to auscultation and percussion with normal breathing effort HEART: regular rate & rhythm and no murmurs and no lower extremity edema ABDOMEN:abdomen soft, non-tender and normal bowel sounds Musculoskeletal:no cyanosis of digits and no clubbing  NEURO: alert & oriented x 3 with fluent speech, no focal motor/sensory deficits  LABORATORY DATA:  I have reviewed the data as listed  Component Value Date/Time   NA 138 06/08/2021 0822   NA 139 04/14/2017 0742   K 5.0 06/08/2021 0822   K 4.0 04/14/2017 0742   CL 106 06/08/2021 0822   CO2 26 06/08/2021 0822   CO2 21 (L) 04/14/2017 0742   GLUCOSE 87 06/08/2021 0822   GLUCOSE 247 (H) 04/14/2017 0742   BUN 22 06/08/2021 0822   BUN 13.1 04/14/2017 0742   CREATININE 1.18 (H) 06/08/2021 0822   CREATININE 0.99 03/12/2021 0832   CREATININE 0.9 04/14/2017 0742   CALCIUM 9.1 06/08/2021 0822   CALCIUM 9.2 04/14/2017 0742   PROT 6.2 (L) 06/08/2021 0822   PROT 7.3 04/14/2017 0742   ALBUMIN 3.7 06/08/2021 0822   ALBUMIN 3.9 04/14/2017 0742   AST 15 06/08/2021 0822   AST 18 03/12/2021 0832   AST 17 04/14/2017 0742   ALT 7 06/08/2021 0822   ALT 9 03/12/2021 0832   ALT 14 04/14/2017 0742   ALKPHOS 50 06/08/2021 0822   ALKPHOS 70 04/14/2017 0742   BILITOT 0.5 06/08/2021 0822   BILITOT 0.8 03/12/2021 0832   BILITOT  0.37 04/14/2017 0742   GFRNONAA 51 (L) 06/08/2021 0822   GFRNONAA >60 03/12/2021 0832   GFRAA >60 01/24/2020 0924   GFRAA >60 07/19/2019 0951    No results found for: SPEP, UPEP  Lab Results  Component Value Date   WBC 6.3 06/08/2021   NEUTROABS 3.7 06/08/2021   HGB 12.3 06/08/2021   HCT 38.4 06/08/2021   MCV 94.1 06/08/2021   PLT 192 06/08/2021      Chemistry      Component Value Date/Time   NA 138 06/08/2021 0822   NA 139 04/14/2017 0742   K 5.0 06/08/2021 0822   K 4.0 04/14/2017 0742   CL 106 06/08/2021 0822   CO2 26 06/08/2021 0822   CO2 21 (L) 04/14/2017 0742   BUN 22 06/08/2021 0822   BUN 13.1 04/14/2017 0742   CREATININE 1.18 (H) 06/08/2021 0822   CREATININE 0.99 03/12/2021 0832   CREATININE 0.9 04/14/2017 0742      Component Value Date/Time   CALCIUM 9.1 06/08/2021 0822   CALCIUM 9.2 04/14/2017 0742   ALKPHOS 50 06/08/2021 0822   ALKPHOS 70 04/14/2017 0742   AST 15 06/08/2021 0822   AST 18 03/12/2021 0832   AST 17 04/14/2017 0742   ALT 7 06/08/2021 0822   ALT 9 03/12/2021 0832   ALT 14 04/14/2017 0742   BILITOT 0.5 06/08/2021 0822   BILITOT 0.8 03/12/2021 0832   BILITOT 0.37 04/14/2017 0742       RADIOGRAPHIC STUDIES: I have reviewed multiple imaging studies with the patient and her husband I have personally reviewed the radiological images as listed and agreed with the findings in the report. CT ABDOMEN PELVIS W CONTRAST  Result Date: 06/10/2021 CLINICAL DATA:  Restaging ovarian cancer. Initial diagnosis September 2018 with recurrence in November 2020. Currently undergoing immunotherapy. EXAM: CT ABDOMEN AND PELVIS WITH CONTRAST TECHNIQUE: Multidetector CT imaging of the abdomen and pelvis was performed using the standard protocol following bolus administration of intravenous contrast. RADIATION DOSE REDUCTION: This exam was performed according to the departmental dose-optimization program which includes automated exposure control, adjustment of the  mA and/or kV according to patient size and/or use of iterative reconstruction technique. CONTRAST:  156m OMNIPAQUE IOHEXOL 300 MG/ML  SOLN COMPARISON:  Multiple previous imaging studies. The most recent CT scan is 12/15/2020 FINDINGS: Lower chest: The heart is normal in size. No pericardial effusion.  There is a small hiatal hernia noted. There are numerous sub 4 mm nodules in the lungs some of which have a cavitary appearance. I see a few of these on the prior studies but it definitely appears to be a progressive process. I think it is unlikely this is metastatic disease more likely a possible immunotherapy related process such as a sarcoid like reaction. Recommend a full chest CT without contrast for further evaluation. Hepatobiliary: No worrisome hepatic lesions. There is a stable area of calcification noted in the falciform ligament area and small calcification along the lateral surface of the left hepatic lobe. No findings suspicious for new peritoneal surface lesions. The gallbladder wall is slightly irregular but this appears stable. Small gallstones are noted. No common bile duct dilatation. Pancreas: No mass, inflammation or ductal dilatation. Spleen: Size. No focal lesions. Adrenals/Urinary Tract: The adrenal glands are normal. No renal lesions or hydronephrosis. The bladder is unremarkable. Stomach/Bowel: Stomach, duodenum, small bowel and colon are unremarkable. No inflammatory changes or obstructive findings. Vascular/Lymphatic: Stable atherosclerotic calcifications involving the aorta no aneurysm dissection. Major venous structures are patent. No mesenteric or retroperitoneal mass or adenopathy. Stable partially calcified lymph node on image 22/2. Other smaller calcified lymph nodes are stable. No findings suspicious for new omental disease or mesenteric or peritoneal implants. Small scattered mesenteric nodes are stable. Reproductive: Surgically absent. Other: No ascites. Stable lower abdominal wall  hernia containing part of the transverse colon. Musculoskeletal: No significant bony findings. IMPRESSION: 1. Numerous sub 4 mm pulmonary nodules in the lungs some of which have a cavitary appearance. I think it is unlikely this is metastatic disease and more likely a possible immunotherapy related process such as a sarcoid like reaction. Recommend full chest CT further evaluation. 2. Stable small calcified retroperitoneal and mesenteric lymph nodes. No findings suspicious for recurrent peritoneal carcinomatosis. 3. Stable lower abdominal wall hernia containing part of the transverse colon. 4. Cholelithiasis. Aortic Atherosclerosis (ICD10-I70.0). Electronically Signed   By: Marijo Sanes M.D.   On: 06/10/2021 09:36

## 2021-06-11 NOTE — Assessment & Plan Note (Signed)
She is not symptomatic from the hernia perspective I do not recommend repair of her hernia

## 2021-06-11 NOTE — Assessment & Plan Note (Signed)
She has excellent blood pressure control She will continue close monitoring of blood pressure

## 2021-06-11 NOTE — Patient Instructions (Signed)
[facility] MEDICAL [facility]COLOG[facility]  ° Discharge Instructions: °Thank you for choosing [facility] to provide your oncology and hematology care.  ° °If you have a lab appointment with the Cancer Center, please go directly to the Cancer Center and check in at the registration area. °  °Wear comfortable clothing and clothing appropriate for easy access to any Portacath or PICC line.  ° °We strive to give you quality time with your provider. You may need to reschedule your appointment if you arrive late (15 or more minutes).  Arriving late affects you and other patients whose appointments are after yours.  Also, if you miss three or more appointments without notifying the office, you may be dismissed from the clinic at the provider’s discretion.    °  °For prescription refill requests, have your pharmacy contact our office and allow 72 hours for refills to be completed.   ° °Today you received the following chemotherapy and/or immunotherapy agents: bevacizumab-bvzr.    °  °To help prevent nausea and vomiting after your treatment, we encourage you to take your nausea medication as directed. ° °BELOW ARE SYMPTOMS THAT SHOULD BE REPORTED IMMEDIATELY: °*FEVER GREATER THAN 100.4 F (38 °C) OR HIGHER °*CHILLS OR SWEATING °*NAUSEA AND VOMITING THAT IS NOT CONTROLLED WITH YOUR NAUSEA MEDICATION °*UNUSUAL SHORTNESS OF BREATH °*UNUSUAL BRUISING OR BLEEDING °*URINARY PROBLEMS (pain or burning when urinating, or frequent urination) °*BOWEL PROBLEMS (unusual diarrhea, constipation, pain near the anus) °TENDERNESS IN MOUTH AND THROAT WITH OR WITHOUT PRESENCE OF ULCERS (sore throat, sores in mouth, or a toothache) °UNUSUAL RASH, SWELLING OR PAIN  °UNUSUAL VAGINAL DISCHARGE OR ITCHING  ° °Items with * indicate a potential emergency and should be followed up as soon as possible or go to the Emergency Department if any problems should occur. ° °Please show the CHEMOTHERAPY ALERT CARD or IMMUNOTHERAPY ALERT CARD at  check-in to the Emergency Department and triage nurse. ° °Should you have questions after your visit or need to cancel or reschedule your appointment, please contact [facility]  Dept: [phone]  and follow the prompts.  Office hours are 8:00 a.m. to 4:30 p.m. Monday - Friday. Please note that voicemails left after 4:00 p.m. may not be returned until the following business day.  We are closed weekends and major holidays. You have access to a nurse at all times for urgent questions. Please call the main number to the clinic Dept: [phone] and follow the prompts. ° ° °For any non-urgent questions, you may also contact your provider using MyChart. We now offer e-Visits for anyone 18 and older to request care online for non-urgent symptoms. For details visit [email or web address]. °  °Also download the MyChart app! Go to the app store, search "MyChart", open the app, select [facility], and log in with your MyChart username and password. ° °Due to Covid, a mask is required upon entering the hospital/clinic. If you do not have a mask, one will be given to you upon arrival. For doctor visits, patients may have 1 support person aged 18 or older with them. For treatment visits, patients cannot have anyone with them due to current Covid guidelines and our immunocompromised population.  ° °

## 2021-06-20 ENCOUNTER — Other Ambulatory Visit: Payer: Self-pay | Admitting: Hematology and Oncology

## 2021-06-20 ENCOUNTER — Encounter: Payer: Self-pay | Admitting: Hematology and Oncology

## 2021-06-21 ENCOUNTER — Other Ambulatory Visit: Payer: Self-pay

## 2021-06-21 DIAGNOSIS — N951 Menopausal and female climacteric states: Secondary | ICD-10-CM

## 2021-06-21 MED ORDER — VENLAFAXINE HCL ER 37.5 MG PO CP24: ORAL_CAPSULE | ORAL | 3 refills | Status: DC

## 2021-07-02 ENCOUNTER — Inpatient Hospital Stay: Payer: Medicare Other | Attending: Hematology and Oncology

## 2021-07-02 ENCOUNTER — Other Ambulatory Visit: Payer: Self-pay

## 2021-07-02 VITALS — BP 157/76 | HR 75 | Temp 97.8°F | Resp 18 | Wt 160.8 lb

## 2021-07-02 DIAGNOSIS — C5701 Malignant neoplasm of right fallopian tube: Secondary | ICD-10-CM | POA: Diagnosis present

## 2021-07-02 DIAGNOSIS — Z5112 Encounter for antineoplastic immunotherapy: Secondary | ICD-10-CM | POA: Insufficient documentation

## 2021-07-02 DIAGNOSIS — Z79899 Other long term (current) drug therapy: Secondary | ICD-10-CM | POA: Diagnosis not present

## 2021-07-02 DIAGNOSIS — Z7189 Other specified counseling: Secondary | ICD-10-CM

## 2021-07-02 MED ORDER — HEPARIN SOD (PORK) LOCK FLUSH 100 UNIT/ML IV SOLN
500.0000 [IU] | Freq: Once | INTRAVENOUS | Status: AC | PRN
  Administered 2021-07-02: 500 [IU]

## 2021-07-02 MED ORDER — SODIUM CHLORIDE 0.9 % IV SOLN
10.0000 mg/kg | Freq: Once | INTRAVENOUS | Status: AC
  Administered 2021-07-02: 700 mg via INTRAVENOUS
  Filled 2021-07-02: qty 16

## 2021-07-02 MED ORDER — SODIUM CHLORIDE 0.9 % IV SOLN: Freq: Once | INTRAVENOUS | Status: AC

## 2021-07-02 MED ORDER — SODIUM CHLORIDE 0.9% FLUSH
10.0000 mL | INTRAVENOUS | Status: DC | PRN
  Administered 2021-07-02: 10 mL

## 2021-07-02 NOTE — Patient Instructions (Signed)
[facility]COL[facility]  Discharge Instructions: °Thank you for choosing [facility] to provide your oncology and hematology care.  ° °If you have a lab appointment with the Cancer Center, please go directly to the Cancer Center and check in at the registration area. °  °Wear comfortable clothing and clothing appropriate for easy access to any Portacath or PICC line.  ° °We strive to give you quality time with your provider. You may need to reschedule your appointment if you arrive late (15 or more minutes).  Arriving late affects you and other patients whose appointments are after yours.  Also, if you miss three or more appointments without notifying the office, you may be dismissed from the clinic at the provider’s discretion.    °  °For prescription refill requests, have your pharmacy contact our office and allow 72 hours for refills to be completed.   ° °Today you received the following chemotherapy and/or immunotherapy agents: Bevacizumab.     °  °To help prevent nausea and vomiting after your treatment, we encourage you to take your nausea medication as directed. ° °BELOW ARE SYMPTOMS THAT SHOULD BE REPORTED IMMEDIATELY: °*FEVER GREATER THAN 100.4 F (38 °C) OR HIGHER °*CHILLS OR SWEATING °*NAUSEA AND VOMITING THAT IS NOT CONTROLLED WITH YOUR NAUSEA MEDICATION °*UNUSUAL SHORTNESS OF BREATH °*UNUSUAL BRUISING OR BLEEDING °*URINARY PROBLEMS (pain or burning when urinating, or frequent urination) °*BOWEL PROBLEMS (unusual diarrhea, constipation, pain near the anus) °TENDERNESS IN MOUTH AND THROAT WITH OR WITHOUT PRESENCE OF ULCERS (sore throat, sores in mouth, or a toothache) °UNUSUAL RASH, SWELLING OR PAIN  °UNUSUAL VAGINAL DISCHARGE OR ITCHING  ° °Items with * indicate a potential emergency and should be followed up as soon as possible or go to the Emergency Department if any problems should occur. ° °Please show the CHEMOTHERAPY ALERT CARD or IMMUNOTHERAPY ALERT CARD at check-in  to the Emergency Department and triage nurse. ° °Should you have questions after your visit or need to cancel or reschedule your appointment, please contact [facility]  Dept: [phone]  and follow the prompts.  Office hours are 8:00 a.m. to 4:30 p.m. Monday - Friday. Please note that voicemails left after 4:00 p.m. may not be returned until the following business day.  We are closed weekends and major holidays. You have access to a nurse at all times for urgent questions. Please call the main number to the clinic Dept: [phone] and follow the prompts. ° ° °For any non-urgent questions, you may also contact your provider using MyChart. We now offer e-Visits for anyone 18 and older to request care online for non-urgent symptoms. For details visit [email or web address]. °  °Also download the MyChart app! Go to the app store, search "MyChart", open the app, select [facility], and log in with your MyChart username and password. ° °Due to Covid, a mask is required upon entering the hospital/clinic. If you do not have a mask, one will be given to you upon arrival. For doctor visits, patients may have 1 support person aged 18 or older with them. For treatment visits, patients cannot have anyone with them due to current Covid guidelines and our immunocompromised population.  ° °

## 2021-07-09 ENCOUNTER — Other Ambulatory Visit (HOSPITAL_COMMUNITY): Payer: Self-pay

## 2021-07-09 ENCOUNTER — Encounter: Payer: Self-pay | Admitting: Hematology and Oncology

## 2021-07-09 ENCOUNTER — Other Ambulatory Visit: Payer: Self-pay | Admitting: Hematology and Oncology

## 2021-07-09 DIAGNOSIS — I10 Essential (primary) hypertension: Secondary | ICD-10-CM

## 2021-07-09 MED ORDER — ATENOLOL 25 MG PO TABS: 25.0000 mg | ORAL_TABLET | Freq: Every day | ORAL | 1 refills | Status: DC

## 2021-07-09 MED ORDER — ATENOLOL 25 MG PO TABS
25.0000 mg | ORAL_TABLET | Freq: Every day | ORAL | 1 refills | Status: DC
  Filled 2021-07-09: qty 30, 30d supply, fill #0

## 2021-07-17 ENCOUNTER — Encounter: Payer: Self-pay | Admitting: Hematology and Oncology

## 2021-07-20 ENCOUNTER — Encounter: Payer: Self-pay | Admitting: Hematology and Oncology

## 2021-07-23 ENCOUNTER — Inpatient Hospital Stay: Payer: Medicare Other | Attending: Hematology and Oncology

## 2021-07-23 ENCOUNTER — Other Ambulatory Visit: Payer: Self-pay

## 2021-07-23 ENCOUNTER — Inpatient Hospital Stay: Payer: Medicare Other

## 2021-07-23 ENCOUNTER — Encounter: Payer: Self-pay | Admitting: Hematology and Oncology

## 2021-07-23 ENCOUNTER — Inpatient Hospital Stay (HOSPITAL_BASED_OUTPATIENT_CLINIC_OR_DEPARTMENT_OTHER): Payer: Medicare Other | Admitting: Hematology and Oncology

## 2021-07-23 DIAGNOSIS — C563 Malignant neoplasm of bilateral ovaries: Secondary | ICD-10-CM | POA: Insufficient documentation

## 2021-07-23 DIAGNOSIS — Z79899 Other long term (current) drug therapy: Secondary | ICD-10-CM | POA: Diagnosis not present

## 2021-07-23 DIAGNOSIS — R7989 Other specified abnormal findings of blood chemistry: Secondary | ICD-10-CM

## 2021-07-23 DIAGNOSIS — R600 Localized edema: Secondary | ICD-10-CM | POA: Insufficient documentation

## 2021-07-23 DIAGNOSIS — Z7189 Other specified counseling: Secondary | ICD-10-CM

## 2021-07-23 DIAGNOSIS — I10 Essential (primary) hypertension: Secondary | ICD-10-CM

## 2021-07-23 DIAGNOSIS — C5701 Malignant neoplasm of right fallopian tube: Secondary | ICD-10-CM

## 2021-07-23 DIAGNOSIS — R809 Proteinuria, unspecified: Secondary | ICD-10-CM | POA: Diagnosis not present

## 2021-07-23 LAB — CBC WITH DIFFERENTIAL/PLATELET
Abs Immature Granulocytes: 0.01 10*3/uL (ref 0.00–0.07)
Basophils Absolute: 0.1 10*3/uL (ref 0.0–0.1)
Basophils Relative: 1 %
Eosinophils Absolute: 0.5 10*3/uL (ref 0.0–0.5)
Eosinophils Relative: 8 %
HCT: 37.5 % (ref 36.0–46.0)
Hemoglobin: 12.2 g/dL (ref 12.0–15.0)
Immature Granulocytes: 0 %
Lymphocytes Relative: 21 %
Lymphs Abs: 1.3 10*3/uL (ref 0.7–4.0)
MCH: 31 pg (ref 26.0–34.0)
MCHC: 32.5 g/dL (ref 30.0–36.0)
MCV: 95.2 fL (ref 80.0–100.0)
Monocytes Absolute: 0.5 10*3/uL (ref 0.1–1.0)
Monocytes Relative: 9 %
Neutro Abs: 3.7 10*3/uL (ref 1.7–7.7)
Neutrophils Relative %: 61 %
Platelets: 175 10*3/uL (ref 150–400)
RBC: 3.94 MIL/uL (ref 3.87–5.11)
RDW: 14.5 % (ref 11.5–15.5)
WBC: 6 10*3/uL (ref 4.0–10.5)
nRBC: 0 % (ref 0.0–0.2)

## 2021-07-23 LAB — COMPREHENSIVE METABOLIC PANEL
ALT: 9 U/L (ref 0–44)
AST: 14 U/L — ABNORMAL LOW (ref 15–41)
Albumin: 3.5 g/dL (ref 3.5–5.0)
Alkaline Phosphatase: 54 U/L (ref 38–126)
Anion gap: 6 (ref 5–15)
BUN: 17 mg/dL (ref 8–23)
CO2: 24 mmol/L (ref 22–32)
Calcium: 8.6 mg/dL — ABNORMAL LOW (ref 8.9–10.3)
Chloride: 109 mmol/L (ref 98–111)
Creatinine, Ser: 1.07 mg/dL — ABNORMAL HIGH (ref 0.44–1.00)
GFR, Estimated: 57 mL/min — ABNORMAL LOW (ref >60)
Glucose, Bld: 107 mg/dL — ABNORMAL HIGH (ref 70–99)
Potassium: 4.4 mmol/L (ref 3.5–5.1)
Sodium: 139 mmol/L (ref 135–145)
Total Bilirubin: 0.5 mg/dL (ref 0.3–1.2)
Total Protein: 6 g/dL — ABNORMAL LOW (ref 6.5–8.1)

## 2021-07-23 LAB — TOTAL PROTEIN, URINE DIPSTICK: Protein, ur: 100 mg/dL — AB

## 2021-07-23 MED ORDER — HYDROCHLOROTHIAZIDE 25 MG PO TABS: 25.0000 mg | ORAL_TABLET | Freq: Every day | ORAL | 3 refills | Status: DC

## 2021-07-23 MED ORDER — SODIUM CHLORIDE 0.9% FLUSH
10.0000 mL | Freq: Once | INTRAVENOUS | Status: AC
  Administered 2021-07-23: 10 mL

## 2021-07-23 NOTE — Assessment & Plan Note (Signed)
She is at risk of dehydration with additional diuretic ?We discussed the importance of adequate fluids on a regular basis ?

## 2021-07-23 NOTE — Progress Notes (Signed)
[facility] ?OFFICE PROGRESS NOTE ? ?Patient Care Team: ?Gaynelle Arabian, MD as PCP - General (Family Medicine) ?Heath Lark, MD as Consulting Physician (Hematology and Oncology) ?Everitt Amber, MD as Consulting Physician (Obstetrics and Gynecology) ? 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 ?Essential hypertension ?She has poorly controlled hypertension with proteinuria ?She also have leg edema secondary to side effects of amlodipine ?I recommend additional diuretic in the morning and switch her amlodipine to take in the evening ?If she continues to have persistently edema, we will discontinue amlodipine and work with other antihypertensives ?We discussed importance of exercise, dietary changes and adequate sleep ? ?Elevated serum creatinine ?She is at risk of dehydration with additional diuretic ?We discussed the importance of adequate fluids on a regular basis ? ?No orders of the defined types were placed in this encounter. ? ? ?All questions were answered. The patient knows to call the clinic with any problems, questions or concerns. ?The total time spent in the appointment was 30 minutes encounter with patients including review of chart and various tests results, discussions about plan of care and coordination of care plan ?  ?Heath Lark, MD ?07/23/2021 12:11 PM ? ?INTERVAL HISTORY: ?Please see below for problem oriented charting. ?she returns for treatment follow-up on maintenance bevacizumab ?She is here with her husband ?She has noted elevated blood pressure at home, occasionally in the 140s to 150s ?She has noticed some leg edema ?She has not been exercising with  walking at far distance due to occasional fatigue or shortness of breath ?Denies abdominal pain or changes in bowel habits ?No dizziness or headache with elevated blood pressure ? ?REVIEW OF SYSTEMS:   ?Constitutional: Denies fevers, chills or abnormal weight loss ?Eyes: Denies blurriness of vision ?Ears, nose, mouth, throat, and face: Denies mucositis or sore throat ?Respiratory: Denies cough, dyspnea or wheezes ?Cardiovascular: Denies palpitation, chest discomfort  ?Gastrointestinal:  Denies nausea, heartburn or change in bowel habits ?Skin: Denies abnormal skin rashes ?Lymphatics: Denies new lymphadenopathy or easy bruising ?Neurological:Denies numbness, tingling or new weaknesses ?Behavioral/Psych: Mood is stable, no new changes  ?All other systems were reviewed with the patient and are negative. ? ?I have reviewed the past medical history, past surgical history, social history and family history with the patient and they are unchanged from previous note. ? ?ALLERGIES:  is allergic to dilaudid [hydromorphone hcl], morphine and related, and sudafed [pseudoephedrine hcl]. ? ?MEDICATIONS:  ?Current Outpatient Medications  ?Medication Sig Dispense Refill  ? hydrochlorothiazide (HYDRODIURIL) 25 MG tablet Take 1 tablet (25 mg total) by mouth daily. 30 tablet 3  ? acetaminophen (TYLENOL) 325 MG tablet Take 975 mg by mouth every 6 (six) hours as needed (pain).    ? atenolol (TENORMIN) 25 MG tablet Take 1 tablet (25 mg total) by mouth daily. 30 tablet 1  ? lidocaine-prilocaine (EMLA) cream Apply to affected area once daily as directed. 30 g 3  ? lisinopril (ZESTRIL) 20 MG tablet TAKE 1 TABLET(20 MG) BY MOUTH DAILY 90 tablet 3  ? ondansetron (ZOFRAN) 8 MG tablet Take 1 tablet (8 mg total) by mouth every 8 (eight) hours as needed. (Patient not taking: Reported on 05/21/2021) 30 tablet 1  ?  prochlorperazine (COMPAZINE) 10 MG tablet TAKE 1 TABLET BY MOUTH EVERY 6 HOURS AS NEEDED FOR NAUSEA AND/OR VOMITING (Patient not taking:  Reported on 05/21/2021) 30 tablet 1  ? venlafaxine XR (EFFEXOR-XR) 37.5 MG 24 hr capsule TAKE 1 CAPSULE BY MOUTH DAILY WITH BREAKFAST 90 capsule 3  ? ?No current facility-administered medications for this visit.  ? ?Facility-Administered Medications Ordered in Other Visits  ?Medication Dose Route Frequency Provider Last Rate Last Admin  ? sodium chloride flush (NS) 0.9 % injection 10 mL  10 mL Intravenous PRN Heath Lark, MD   10 mL at 02/11/17 0839  ? sodium chloride flush (NS) 0.9 % injection 10 mL  10 mL Intravenous PRN Alvy Bimler, Victory Dresden, MD   10 mL at 03/04/17 1510  ? ? ?SUMMARY OF ONCOLOGIC HISTORY: ?Oncology History Overview Note  ?Neg genetics ?  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 The patient has many months of vague symptoms of fullness in the pelvis, urinary frequency and difficulty with defecation. She denies vaginal bleeding or rectal bleeding. ?  ?  ?08/12/2016 Imaging  ? Abdomen X-ray in the ER: ?Moderate colonic stool burden without evidence of enteric obstruction ?  ?11/13/2016 Imaging  ? TVUS was performed on 11/13/16 which showed a large hypoechoic lobular mass seen posterior to the uterus measuring 18.2x16.1x11.8cm, blood flow was seen along the periphery of the mass. It was considered to be likely to be a fibroid vs pelvic mass vs ovarian mass. Neither ovary was removed. Moderate amount of free fluid in the pelvis. THe uterus measures 4x10x4cm. The endometrium is 25m. ? ?  ?12/05/2016 Imaging  ? MR pelvis ?1. Mild limitations as detailed above. ?2. Heterogeneous pelvic mass is favored to arise from the right ovary. Favor a solid ovarian neoplasm such as fibroma/Brenner's tumor. Given lesion size and heterogeneous T2 signal out, complicating torsion cannot be excluded. ?3. Moderate abdominopelvic ascites, without specific evidence of peritoneal metastasis. Consider further evaluation with contrast-enhanced abdominopelvic CT.  ?  ?12/26/2016 Pathology Results  ? 1.  Uterus, ovaries and fallopian tubes ?- ADENOCARCINOMA INVOLVING RIGHT FALLOPIAN TUBE, RIGHT AND LEFT OVARIES, UTERINE SEROSA AND ?PELVIC MASS. ?- SEE ONCOLOGY TABLE AND COMMENT. ?- UTERINE CERVIX, ENDOMETRIUM, MYOMETRIUM AND LEFT FALLOPIAN TUBE FREE OF TUMOR. ?2. Omentum, resection for tumor ?- ADENOCARCINOMA. ?Microscopic Comment ?1. ONCOLOGY TABLE - FALLOPIAN TUBE. SEE COMMENT ?1. Specimen, including laterality: Uterus, bilateral adnexa, pelvic mass and omentum. ?2. Procedure: Hysterectomy with bilateral salpingo-oophorectomy, pelvic mass excision and omentectomy. ?3. Lymph node sampling performed: No ?4. Tumor site: See comment. ?5. Tumor location in fallopian tube: Right fallopian tube fimbria ?6. Specimen integrity (intact/ruptured/disrupted): Intact ?7. Tumor size (cm): 18 cm, see comment. ?8. Histologic type: Adenocarcinoma ?9. Grade: High grade ?10. Microscopic tumor extension: Tumor involves right fallopian tube, right and left ovaries, uterine serosa, pelvic ?mass and omentum. ?11. Margins: See comment. ?12. Lymph-Vascular invasion: Present ?13. Lymph nodes: # examined: 0; # positive: N/A ?14. TNM: pT3c, pNX ?15. FIGO Stage (based on pathologic findings, needs clinical correlation: III-C ?16. Comments: There is an 18 cm pelvic mass which is a high grade adenocarcinoma and there is a 12.2 cm  segment of omentum which is extensively involved with adenocarcinoma. The tumor also involves the fimbria of the right fallopian tube and the parenchyma of the right and left ovaries as well as uterine serosa. The fimbria of the right fallopian has intraepithelial atypia consistent with a precursor lesion and therefore this is most consistent with primary  fallopian tube adenocarcinoma. A primary peritoneal serous carcinoma is a less likely possibility.  ?  ?12/26/2016 Pathology Results  ? PERITONEAL/ASCITIC FLUID(SPECIMEN 1 OF 1 COLLECTED 12/26/16): ?POORLY DIFFERENTIATED ADENOCARCINOMA ?  ?12/26/2016 Surgery  ? Procedure(s)  Performed: Exploratory laparotomy with total abdominal hysterectomy, bilateral salpingo-oophorectomy, omentectomy radical tumor debulking for ovarian cancer . ?  ?Surgeon: Thereasa Solo, MD. ?     ?Operative

## 2021-07-23 NOTE — Assessment & Plan Note (Signed)
She has poorly controlled hypertension with proteinuria ?She also have leg edema secondary to side effects of amlodipine ?I recommend additional diuretic in the morning and switch her amlodipine to take in the evening ?If she continues to have persistently edema, we will discontinue amlodipine and work with other antihypertensives ?We discussed importance of exercise, dietary changes and adequate sleep ?

## 2021-07-23 NOTE — Assessment & Plan Note (Signed)
Her last imaging study showed no evidence of disease ?Her treatment is complicated by proteinuria and poorly controlled hypertension ?I will cancel her treatment today ?I will make changes to her medication to get her blood pressure better controlled ?I will see her again next month for further follow-up ?

## 2021-07-25 ENCOUNTER — Encounter: Payer: Self-pay | Admitting: Hematology and Oncology

## 2021-07-27 ENCOUNTER — Encounter: Payer: Self-pay | Admitting: Hematology and Oncology

## 2021-08-01 ENCOUNTER — Encounter: Payer: Self-pay | Admitting: Hematology and Oncology

## 2021-08-01 ENCOUNTER — Telehealth: Payer: Self-pay

## 2021-08-01 NOTE — Telephone Encounter (Signed)
Called pt regarding mychart message. Pt denies h/a, chest tightness/pain, ShOB, blurred vision, tachycardia. She states, "I feel absolutely fine." Pt reports she did take one dose of Amlodipine which MD had advised to stop and pt states she thinks it helped and wants to know if she should resume taking it. Advised her I would make MD aware of concerns and someone would be in contact, in the mean time if she develops above sx she should call 911 and go to ED for further evaluation. Pt verbalized thanks and understanding.  ?

## 2021-08-08 ENCOUNTER — Encounter: Payer: Self-pay | Admitting: Hematology and Oncology

## 2021-08-17 ENCOUNTER — Encounter: Payer: Self-pay | Admitting: Hematology and Oncology

## 2021-08-20 ENCOUNTER — Other Ambulatory Visit: Payer: Self-pay | Admitting: *Deleted

## 2021-08-20 ENCOUNTER — Inpatient Hospital Stay: Payer: Medicare Other | Attending: Hematology and Oncology

## 2021-08-20 ENCOUNTER — Other Ambulatory Visit: Payer: Self-pay

## 2021-08-20 ENCOUNTER — Inpatient Hospital Stay: Payer: Medicare Other

## 2021-08-20 ENCOUNTER — Inpatient Hospital Stay (HOSPITAL_BASED_OUTPATIENT_CLINIC_OR_DEPARTMENT_OTHER): Payer: Medicare Other | Admitting: Hematology and Oncology

## 2021-08-20 DIAGNOSIS — Z5112 Encounter for antineoplastic immunotherapy: Secondary | ICD-10-CM | POA: Diagnosis present

## 2021-08-20 DIAGNOSIS — C5701 Malignant neoplasm of right fallopian tube: Secondary | ICD-10-CM

## 2021-08-20 DIAGNOSIS — R7989 Other specified abnormal findings of blood chemistry: Secondary | ICD-10-CM | POA: Diagnosis not present

## 2021-08-20 DIAGNOSIS — Z7189 Other specified counseling: Secondary | ICD-10-CM

## 2021-08-20 DIAGNOSIS — I10 Essential (primary) hypertension: Secondary | ICD-10-CM

## 2021-08-20 DIAGNOSIS — Z79899 Other long term (current) drug therapy: Secondary | ICD-10-CM | POA: Diagnosis not present

## 2021-08-20 DIAGNOSIS — R809 Proteinuria, unspecified: Secondary | ICD-10-CM | POA: Diagnosis not present

## 2021-08-20 LAB — CBC WITH DIFFERENTIAL/PLATELET
Abs Immature Granulocytes: 0.01 10*3/uL (ref 0.00–0.07)
Basophils Absolute: 0.1 10*3/uL (ref 0.0–0.1)
Basophils Relative: 1 %
Eosinophils Absolute: 0.4 10*3/uL (ref 0.0–0.5)
Eosinophils Relative: 5 %
HCT: 40.4 % (ref 36.0–46.0)
Hemoglobin: 12.9 g/dL (ref 12.0–15.0)
Immature Granulocytes: 0 %
Lymphocytes Relative: 17 %
Lymphs Abs: 1.2 10*3/uL (ref 0.7–4.0)
MCH: 30.5 pg (ref 26.0–34.0)
MCHC: 31.9 g/dL (ref 30.0–36.0)
MCV: 95.5 fL (ref 80.0–100.0)
Monocytes Absolute: 0.6 10*3/uL (ref 0.1–1.0)
Monocytes Relative: 8 %
Neutro Abs: 5 10*3/uL (ref 1.7–7.7)
Neutrophils Relative %: 69 %
Platelets: 201 10*3/uL (ref 150–400)
RBC: 4.23 MIL/uL (ref 3.87–5.11)
RDW: 13.8 % (ref 11.5–15.5)
WBC: 7.2 10*3/uL (ref 4.0–10.5)
nRBC: 0 % (ref 0.0–0.2)

## 2021-08-20 LAB — COMPREHENSIVE METABOLIC PANEL
ALT: 7 U/L (ref 0–44)
AST: 14 U/L — ABNORMAL LOW (ref 15–41)
Albumin: 3.7 g/dL (ref 3.5–5.0)
Alkaline Phosphatase: 50 U/L (ref 38–126)
Anion gap: 5 (ref 5–15)
BUN: 24 mg/dL — ABNORMAL HIGH (ref 8–23)
CO2: 27 mmol/L (ref 22–32)
Calcium: 9.1 mg/dL (ref 8.9–10.3)
Chloride: 103 mmol/L (ref 98–111)
Creatinine, Ser: 1.16 mg/dL — ABNORMAL HIGH (ref 0.44–1.00)
GFR, Estimated: 52 mL/min — ABNORMAL LOW (ref >60)
Glucose, Bld: 106 mg/dL — ABNORMAL HIGH (ref 70–99)
Potassium: 4.8 mmol/L (ref 3.5–5.1)
Sodium: 135 mmol/L (ref 135–145)
Total Bilirubin: 0.5 mg/dL (ref 0.3–1.2)
Total Protein: 6.4 g/dL — ABNORMAL LOW (ref 6.5–8.1)

## 2021-08-20 LAB — TOTAL PROTEIN, URINE DIPSTICK: Protein, ur: 100 mg/dL — AB

## 2021-08-20 MED ORDER — SODIUM CHLORIDE 0.9% FLUSH: 10.0000 mL | INTRAVENOUS | Status: DC | PRN

## 2021-08-20 MED ORDER — HEPARIN SOD (PORK) LOCK FLUSH 100 UNIT/ML IV SOLN: 500.0000 [IU] | Freq: Once | INTRAVENOUS | Status: DC

## 2021-08-20 MED ORDER — SODIUM CHLORIDE 0.9% FLUSH
10.0000 mL | Freq: Once | INTRAVENOUS | Status: AC
  Administered 2021-08-20: 10 mL

## 2021-08-20 MED ORDER — HYDRALAZINE HCL 50 MG PO TABS: 50.0000 mg | ORAL_TABLET | Freq: Two times a day (BID) | ORAL | 3 refills | Status: DC

## 2021-08-21 ENCOUNTER — Encounter: Payer: Self-pay | Admitting: Hematology and Oncology

## 2021-08-21 ENCOUNTER — Other Ambulatory Visit: Payer: Self-pay

## 2021-08-21 ENCOUNTER — Encounter (HOSPITAL_COMMUNITY): Payer: Self-pay

## 2021-08-21 ENCOUNTER — Emergency Department (HOSPITAL_COMMUNITY)
Admission: EM | Admit: 2021-08-21 | Discharge: 2021-08-21 | Disposition: A | Discharge status: Home / Self Care | Payer: Medicare Other | Attending: Emergency Medicine | Admitting: Emergency Medicine

## 2021-08-21 DIAGNOSIS — I159 Secondary hypertension, unspecified: Secondary | ICD-10-CM

## 2021-08-21 DIAGNOSIS — I10 Essential (primary) hypertension: Secondary | ICD-10-CM | POA: Diagnosis present

## 2021-08-21 DIAGNOSIS — R1110 Vomiting, unspecified: Secondary | ICD-10-CM | POA: Diagnosis not present

## 2021-08-21 LAB — COMPREHENSIVE METABOLIC PANEL
ALT: 9 U/L (ref 0–44)
AST: 15 U/L (ref 15–41)
Albumin: 3.9 g/dL (ref 3.5–5.0)
Alkaline Phosphatase: 51 U/L (ref 38–126)
Anion gap: 7 (ref 5–15)
BUN: 22 mg/dL (ref 8–23)
CO2: 23 mmol/L (ref 22–32)
Calcium: 9.5 mg/dL (ref 8.9–10.3)
Chloride: 103 mmol/L (ref 98–111)
Creatinine, Ser: 1.02 mg/dL — ABNORMAL HIGH (ref 0.44–1.00)
GFR, Estimated: >60 mL/min (ref >60)
Glucose, Bld: 109 mg/dL — ABNORMAL HIGH (ref 70–99)
Potassium: 5.5 mmol/L — ABNORMAL HIGH (ref 3.5–5.1)
Sodium: 133 mmol/L — ABNORMAL LOW (ref 135–145)
Total Bilirubin: 0.7 mg/dL (ref 0.3–1.2)
Total Protein: 6.9 g/dL (ref 6.5–8.1)

## 2021-08-21 LAB — CBC
HCT: 44.4 % (ref 36.0–46.0)
Hemoglobin: 14.5 g/dL (ref 12.0–15.0)
MCH: 31.5 pg (ref 26.0–34.0)
MCHC: 32.7 g/dL (ref 30.0–36.0)
MCV: 96.3 fL (ref 80.0–100.0)
Platelets: 214 10*3/uL (ref 150–400)
RBC: 4.61 MIL/uL (ref 3.87–5.11)
RDW: 14 % (ref 11.5–15.5)
WBC: 8.2 10*3/uL (ref 4.0–10.5)
nRBC: 0 % (ref 0.0–0.2)

## 2021-08-21 MED ORDER — CLONIDINE HCL 0.1 MG PO TABS: 0.1000 mg | ORAL_TABLET | Freq: Every day | ORAL | 0 refills | Status: DC

## 2021-08-21 NOTE — Progress Notes (Signed)
Holly [name] ?OFFICE PROGRESS NOTE ? ?Patient Care Team: ?Gaynelle Arabian, MD as PCP - General (Family Medicine) ?Heath Lark, MD as Consulting Physician (Hematology and Oncology) ?Everitt Amber, MD as Consulting Physician (Obstetrics and Gynecology) ? 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 ?Essential hypertension ?She has poorly controlled hypertension with proteinuria ?She also have leg edema secondary to side effects of amlodipine ?I recommend addition of hydralazine ?We will call her next week to see how her blood pressure control is and we will continue to titrate her medication as needed ?We discussed importance of exercise, dietary changes and adequate sleep ? ?Elevated serum creatinine ?She is at risk of dehydration with additional diuretic ?We discussed the importance of adequate fluids on a regular basis ? ?No orders of the defined types were placed in this encounter. ? ? ?All questions were answered. The patient knows to call the clinic with any problems, questions or concerns. ?The total time spent in the appointment was 30 minutes encounter with patients including review of chart and various tests results, discussions about plan of care and coordination of care plan ?  ?Heath Lark, MD ?08/21/2021 8:49 AM ? ?INTERVAL HISTORY: ?Please see below for problem oriented charting. ?she returns for treatment follow-up with her husband, on maintenance treatment with bevacizumab ?Her blood pressure is very high today but she is not symptomatic ?Documentation of blood pressure from the last few weeks were  reviewed.  Majority of her blood pressure ranges between normal to systolic around 122Q ?Denies recent leg swelling ? ?REVIEW OF SYSTEMS:   ?Constitutional: Denies fevers, chills or abnormal weight loss ?Eyes: Denies blurriness of vision ?Ears, nose, mouth, throat, and face: Denies mucositis or sore throat ?Respiratory: Denies cough, dyspnea or wheezes ?Cardiovascular: Denies palpitation, chest discomfort or lower extremity swelling ?Gastrointestinal:  Denies nausea, heartburn or change in bowel habits ?Skin: Denies abnormal skin rashes ?Lymphatics: Denies new lymphadenopathy or easy bruising ?Neurological:Denies numbness, tingling or new weaknesses ?Behavioral/Psych: Mood is stable, no new changes  ?All other systems were reviewed with the patient and are negative. ? ?I have reviewed the past medical history, past surgical history, social history and family history with the patient and they are unchanged from previous note. ? ?ALLERGIES:  is allergic to dilaudid [hydromorphone hcl], morphine and related, and sudafed [pseudoephedrine hcl]. ? ?MEDICATIONS:  ?Current Outpatient Medications  ?Medication Sig Dispense Refill  ? hydrALAZINE (APRESOLINE) 50 MG tablet Take 1 tablet (50 mg total) by mouth 2 (two) times daily. 60 tablet 3  ? acetaminophen (TYLENOL) 325 MG tablet Take 975 mg by mouth every 6 (six) hours as needed (pain).    ? atenolol (TENORMIN) 25 MG tablet Take 1 tablet (25 mg total) by mouth daily. 30 tablet 1  ? hydrochlorothiazide (HYDRODIURIL) 25 MG tablet Take 1 tablet (25 mg total) by mouth daily. 30 tablet 3  ? lidocaine-prilocaine (EMLA) cream Apply to affected area once daily as directed. 30 g 3  ? lisinopril (ZESTRIL) 20 MG tablet TAKE 1 TABLET(20 MG) BY MOUTH DAILY 90 tablet 3  ? ondansetron (ZOFRAN) 8 MG tablet Take 1 tablet (8  mg total) by mouth every 8 (eight) hours as needed. (Patient not taking: Reported on 05/21/2021) 30 tablet 1  ? prochlorperazine (COMPAZINE) 10 MG tablet TAKE 1 TABLET BY  MOUTH EVERY 6 HOURS AS NEEDED FOR NAUSEA AND/OR VOMITING (Patient not taking: Reported on 05/21/2021) 30 tablet 1  ? venlafaxine XR (EFFEXOR-XR) 37.5 MG 24 hr capsule TAKE 1 CAPSULE BY MOUTH DAILY WITH BREAKFAST 90 capsule 3  ? ?No current facility-administered medications for this visit.  ? ?Facility-Administered Medications Ordered in Other Visits  ?Medication Dose Route Frequency Provider Last Rate Last Admin  ? sodium chloride flush (NS) 0.9 % injection 10 mL  10 mL Intravenous PRN Heath Lark, MD   10 mL at 02/11/17 0839  ? sodium chloride flush (NS) 0.9 % injection 10 mL  10 mL Intravenous PRN Alvy Bimler, Darryel Diodato, MD   10 mL at 03/04/17 1510  ? ? ?SUMMARY OF ONCOLOGIC HISTORY: ?Oncology History Overview Note  ?Neg genetics ? ?  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 The patient has many months of vague symptoms of fullness in the pelvis, urinary frequency and difficulty with defecation. She denies vaginal bleeding or rectal bleeding. ?  ? ?  ?08/12/2016 Imaging  ? Abdomen X-ray in the ER: ?Moderate colonic stool burden without evidence of enteric obstruction ? ?  ?11/13/2016 Imaging  ? TVUS was performed on 11/13/16 which showed a large hypoechoic lobular mass seen posterior to the uterus measuring 18.2x16.1x11.8cm, blood flow was seen along the periphery of the mass. It was considered to be likely to be a fibroid vs pelvic mass vs ovarian mass. Neither ovary was removed. Moderate amount of free fluid in the pelvis. THe uterus measures 4x10x4cm. The endometrium is 18m. ? ?  ?12/05/2016 Imaging  ? MR pelvis ?1. Mild limitations as detailed above. ?2. Heterogeneous pelvic mass is favored to arise from the right ovary. Favor a solid ovarian neoplasm such as fibroma/Brenner's tumor. Given lesion size and heterogeneous T2 signal out, complicating torsion cannot be excluded. ?3. Moderate abdominopelvic ascites, without specific evidence of peritoneal metastasis. Consider further evaluation  with contrast-enhanced abdominopelvic CT.  ? ?  ?12/26/2016 Pathology Results  ? 1. Uterus, ovaries and fallopian tubes ?- ADENOCARCINOMA INVOLVING RIGHT FALLOPIAN TUBE, RIGHT AND LEFT OVARIES, UTERINE SEROSA AND ?PELVIC MASS. ?- SEE ONCOLOGY TABLE AND COMMENT. ?- UTERINE CERVIX, ENDOMETRIUM, MYOMETRIUM AND LEFT FALLOPIAN TUBE FREE OF TUMOR. ?2. Omentum, resection for tumor ?- ADENOCARCINOMA. ?Microscopic Comment ?1. ONCOLOGY TABLE - FALLOPIAN TUBE. SEE COMMENT ?1. Specimen, including laterality: Uterus, bilateral adnexa, pelvic mass and omentum. ?2. Procedure: Hysterectomy with bilateral salpingo-oophorectomy, pelvic mass excision and omentectomy. ?3. Lymph node sampling performed: No ?4. Tumor site: See comment. ?5. Tumor location in fallopian tube: Right fallopian tube fimbria ?6. Specimen integrity (intact/ruptured/disrupted): Intact ?7. Tumor size (cm): 18 cm, see comment. ?8. Histologic type: Adenocarcinoma ?9. Grade: High grade ?10. Microscopic tumor extension: Tumor involves right fallopian tube, right and left ovaries, uterine serosa, pelvic ?mass and omentum. ?11. Margins: See comment. ?12. Lymph-Vascular invasion: Present ?13. Lymph nodes: # examined: 0; # positive: N/A ?14. TNM: pT3c, pNX ?15. FIGO Stage (based on pathologic findings, needs clinical correlation: III-C ?16. Comments: There is an 18 cm pelvic mass which is a high grade adenocarcinoma and there is a 12.2 cm  segment of omentum which is extensively involved with adenocarcinoma. The tumor also involves the fimbria of the right fallopian tube and the parenchyma of the right and left ovaries as well  as uterine serosa. The fimbria of the right fallopian has intraepithelial atypia consistent with a precursor lesion and therefore this is most consistent with primary fallopian tube adenocarcinoma. A primary peritoneal serous carcinoma is a less likely possibility.  ? ?  ?12/26/2016 Pathology Results  ? PERITONEAL/ASCITIC FLUID(SPECIMEN 1 OF 1 COLLECTED  12/26/16): ?POORLY DIFFERENTIATED ADENOCARCINOMA ? ?  ?12/26/2016 Surgery  ? Procedure(s) Performed: Exploratory laparotomy with total abdominal hysterectomy, bilateral salpingo-oophorectomy, omentectomy radi

## 2021-08-21 NOTE — ED Provider Notes (Signed)
?Central DEPT ?Provider Note ? ? ?CSN: [id number] ?Arrival date & time: 08/21/21  1448 ? ?  ? ?History ? ?Chief Complaint  ?Patient presents with  ? Hypertension  ? ? ?Holly [name] is a 66 y.o. female. ? ?HPI ? ?66 year old female with past medical history of ovarian cancer, HTN presents to the emergency department with concern for hypertension.  Patient follows with oncology, and is currently receiving chemotherapy.  Was seen in the office yesterday and unable to receive her treatment due to hypertension.  She is on 3 different agents for hypertension and yesterday was prescribed hydralazine.  She took 2 doses yesterday, which resulted in upset stomach and an episode of vomiting.  Her blood pressure decreased yesterday back to her baseline.  Overnight she had a gradual generalized headache.  When she woke up this morning the headache was resolved, no further vomiting.  She called her primary doctor in regards to these symptoms.  In the setting of hypertension they urged her to come to the ER for evaluation.  On arrival patient is symptom-free.  Denies any headache, neck pain or stiffness.  She has no chest pain or shortness of breath.  She never had any focal neurologic deficits/complaints.  She decreased her hydralazine dose today and attributes her symptoms to that new medication.  She states her baseline systolic blood pressure is usually around 150.  She did not want come to the ER but when her husband took her blood pressure at home and the systolic was 099 he encouraged her to come in.  Currently she feels well and has no acute complaints. ? ?Home Medications ?Prior to Admission medications   ?Medication Sig Start Date End Date Taking? Authorizing Provider  ?acetaminophen (TYLENOL) 325 MG tablet Take 650 mg by mouth every 6 (six) hours as needed for headache (pain).   Yes [provider]  ?atenolol (TENORMIN) 25 MG tablet Take 1 tablet (25 mg total) by mouth daily.  07/09/21  Yes Heath Lark, MD  ?cloNIDine (CATAPRES) 0.1 MG tablet Take 1 tablet (0.1 mg total) by mouth daily. 08/21/21  Yes Reginald Weida, Alvin Critchley, DO  ?hydrALAZINE (APRESOLINE) 50 MG tablet Take 1 tablet (50 mg total) by mouth 2 (two) times daily. 08/20/21  Yes Gorsuch, Ni, MD  ?hydrochlorothiazide (HYDRODIURIL) 25 MG tablet Take 1 tablet (25 mg total) by mouth daily. 07/23/21  Yes Gorsuch, Ni, MD  ?lidocaine-prilocaine (EMLA) cream Apply to affected area once daily as directed. ?Patient taking differently: 1 application. as needed (access port). Apply to affected area once daily as directed. 09/25/20  Yes Gorsuch, Ni, MD  ?lisinopril (ZESTRIL) 20 MG tablet TAKE 1 TABLET(20 MG) BY MOUTH DAILY ?Patient taking differently: Take 20 mg by mouth every evening. 12/18/20  Yes Gorsuch, Ni, MD  ?venlafaxine XR (EFFEXOR-XR) 37.5 MG 24 hr capsule TAKE 1 CAPSULE BY MOUTH DAILY WITH BREAKFAST 06/21/21  Yes Gorsuch, Ni, MD  ?ondansetron (ZOFRAN) 8 MG tablet Take 1 tablet (8 mg total) by mouth every 8 (eight) hours as needed. ?Patient not taking: Reported on 05/21/2021 09/25/20   Heath Lark, MD  ?prochlorperazine (COMPAZINE) 10 MG tablet TAKE 1 TABLET BY MOUTH EVERY 6 HOURS AS NEEDED FOR NAUSEA AND/OR VOMITING ?Patient not taking: Reported on 05/21/2021 09/25/20 09/25/21  Heath Lark, MD  ?   ? ?Allergies    ?Dilaudid [hydromorphone hcl], Morphine and related, and Sudafed [pseudoephedrine hcl]   ? ?Review of Systems   ?Review of Systems  ?Constitutional:  Negative for fever.  ?  Eyes:  Negative for visual disturbance.  ?Respiratory:  Negative for shortness of breath.   ?Cardiovascular:  Negative for chest pain.  ?Gastrointestinal:  Positive for vomiting. Negative for abdominal pain and diarrhea.  ?Musculoskeletal:  Negative for neck pain and neck stiffness.  ?Skin:  Negative for rash.  ?Neurological:  Positive for headaches. Negative for dizziness, syncope, facial asymmetry, speech difficulty and weakness.  ? ?Physical Exam ?Updated Vital Signs ?BP (!)  161/76   Pulse 62   Temp 97.8 ?F (36.6 ?C) (Oral)   Resp 16   Ht '5\' 5"'$  (1.651 m)   Wt 68.6 kg   SpO2 97%   BMI 25.16 kg/m?  ?Physical Exam ?Vitals and nursing note reviewed.  ?Constitutional:   ?   General: She is not in acute distress. ?   Appearance: Normal appearance. She is not ill-appearing or diaphoretic.  ?HENT:  ?   Head: Normocephalic and atraumatic.  ?   Mouth/Throat:  ?   Mouth: Mucous membranes are moist.  ?Eyes:  ?   Extraocular Movements: Extraocular movements intact.  ?   Pupils: Pupils are equal, round, and reactive to light.  ?Cardiovascular:  ?   Rate and Rhythm: Normal rate.  ?Pulmonary:  ?   Effort: Pulmonary effort is normal. No respiratory distress.  ?Abdominal:  ?   Palpations: Abdomen is soft.  ?   Tenderness: There is no abdominal tenderness.  ?Musculoskeletal:     ?   General: No swelling.  ?   Cervical back: No rigidity or tenderness.  ?Skin: ?   General: Skin is warm.  ?Neurological:  ?   General: No focal deficit present.  ?   Mental Status: She is alert and oriented to person, place, and time. Mental status is at baseline.  ?   Cranial Nerves: No cranial nerve deficit.  ?Psychiatric:     ?   Mood and Affect: Mood normal.  ? ? ?ED Results / Procedures / Treatments   ?Labs ?(all labs ordered are listed, but only abnormal results are displayed) ?Labs Reviewed  ?COMPREHENSIVE METABOLIC PANEL - Abnormal; Notable for the following components:  ?    Result Value  ? Sodium 133 (*)   ? Potassium 5.5 (*)   ? Glucose, Bld 109 (*)   ? Creatinine, Ser 1.02 (*)   ? All other components within normal limits  ?CBC  ? ? ?EKG ?None ? ?Radiology ?No results found. ? ?Procedures ?Procedures  ? ? ?Medications Ordered in ED ?Medications - No data to display ? ?ED Course/ Medical Decision Making/ A&P ?  ?                        ?Medical Decision Making ?Risk ?Prescription drug management. ? ? ?66 year old female presents emergency department with concern for hypertension.  Patient had hydralazine added  to her regimen yesterday.  After taking 2 doses she had an episode of vomiting.  Overnight developed a headache that self resolved.  Continued to have hypertension this morning and was urged to come to the ER for evaluation.  On arrival she states her symptoms have resolved.  She has no ongoing headache.  No neck pain or stiffness.  No acute neurologic complaint.  No chest pain. ? ?On arrival her blood pressure was 191/79.  On my evaluation the patient's blood pressure is 156/82 without any intervention.  This is around the patient's baseline.  Patient believes when she took the hydralazine that it upset her  stomach and that resulted in vomiting.  I do not believe that the vomiting/headache was related to a hypertensive setting/bleed as her BP was normalized last night after hydralazine doses, and symptoms are currently resolved.  It would be atypical for the symptoms of HTN ICH to resolve and for the patient to have no lasting neurodeficit or complaint at my time of evaluation.  Blood pressure for the last couple hours has ranged from 810-175 systolic.  Again this is around the patient's baseline.  She has no ongoing symptoms.  Her blood work showed a mild potassium of 5.5. She is on HCTZ.  I encouraged oral hydration and plan for repeat potassium level as an outpatient.  Her renal function is otherwise baseline, I believe with oral hydration and continued usage of hydrochlorothiazide this will normalize and does not require any acute intervention at this time. ? ?Patient states when she talked her primary doctor today that they discontinued her hydralazine.  They are asking for a different agent for blood pressure control.  I discussed with them how an additional agent really needs to come from the primary doctor for more prolonged management however I have agreed to give a short course of clonidine as needed for elevated blood pressure.  The patient and spouse understand that if she is to develop any further  symptoms including chest pain, shortness of breath, swelling of her lower extremities, acute headache/neck pain, neuro deficit that she is to return immediately for evaluation. ? ?On reevaluation blood pressure ha

## 2021-08-21 NOTE — Assessment & Plan Note (Addendum)
Her last imaging study showed no evidence of disease ?Her treatment is complicated by proteinuria and poorly controlled hypertension ?I will cancel her treatment today ?I will make changes to her medication to get her blood pressure better controlled ?I will see her again in a few weeks for further follow-up ?I will call her next week to check on blood pressure control ?

## 2021-08-21 NOTE — Assessment & Plan Note (Signed)
She has poorly controlled hypertension with proteinuria ?She also have leg edema secondary to side effects of amlodipine ?I recommend addition of hydralazine ?We will call her next week to see how her blood pressure control is and we will continue to titrate her medication as needed ?We discussed importance of exercise, dietary changes and adequate sleep ?

## 2021-08-21 NOTE — Assessment & Plan Note (Signed)
She is at risk of dehydration with additional diuretic ?We discussed the importance of adequate fluids on a regular basis ?

## 2021-08-21 NOTE — Discharge Instructions (Signed)
You have been seen and discharged from the emergency department.  Increase your hydration over the next couple days.  Stay compliant with your home medications.  Discontinue the hydralazine at the request of her primary doctor.  You should defer any additional high blood pressure medication to your primary doctor.  In the event you cannot get in contact with them or the blood pressure elevates, you have been prescribed an additional medicine called clonidine.  Follow-up with your primary provider for further evaluation and further care. Take home medications as prescribed. If you have any worsening symptoms, chest pain, shortness of breath, headache, uncontrolled blood pressure or further concerns for your health please return to an emergency department for further evaluation. ?

## 2021-08-21 NOTE — ED Provider Triage Note (Signed)
Emergency Medicine Provider Triage Evaluation Note ? ?Holly [name] , a 66 y.o. female  was evaluated in triage.  Pt complains of headache and vomiting. Hx of adenocarcinoma of right fallopian tube, followed by Dr. Alvy Bimler with oncology. Was seen yesterday and had hydralazine added to HTN regimen for uncontrolled HTN with proteinuria. They discussed risk of dehydration with additional diuretic, she was encouraged to take in adequate fluids. Contacted her oncologist about having a headache with associated HTN (188/72) and vomiting so she came to the ER.  ? ?Review of Systems  ?Positive: Headache, vomiting ?Negative: Fever, syncope, numbness, weakness ? ?Physical Exam  ?BP (!) 210/82   Pulse 65   Temp 97.8 ?F (36.6 ?C) (Oral)   Resp 16   Ht '5\' 5"'$  (1.651 m)   Wt 68.6 kg   SpO2 98%   BMI 25.16 kg/m?  ?Gen:   Awake, no distress   ?Resp:  Normal effort  ?MSK:   Moves extremities without difficulty  ?Other:   ? ?Medical Decision Making  ?Medically screening exam initiated at 4:40 PM.  Appropriate orders placed.  Holly [name] was informed that the remainder of the evaluation will be completed by another provider, this initial triage assessment does not replace that evaluation, and the importance of remaining in the ED until their evaluation is complete. ? ? ?  ?Elize Pinon T, PA-C ?08/21/21 1640 ? ?

## 2021-08-21 NOTE — ED Triage Notes (Addendum)
Patient is currently taken chemo. ?Patient was unable to receive her autoimmune treatment yesterday because of elevated BP. ?Patient states her BP med was switched yesterday. Patient called her oncologist today and was tld to not take her BP med for he next 2 days. ?

## 2021-08-23 ENCOUNTER — Telehealth: Payer: Self-pay

## 2021-08-23 NOTE — Telephone Encounter (Signed)
Called and given below message on how to take Clonidine. She verbalized understanding and will call the office back if needed. ?

## 2021-08-23 NOTE — Telephone Encounter (Signed)
Yes, if SBP is over 160 she should add clonidine ?

## 2021-08-23 NOTE — Telephone Encounter (Signed)
Called and left a message asking her to call the office back. 

## 2021-08-23 NOTE — Telephone Encounter (Signed)
Called and given below message. She verbalized understanding and is agreeable to take 1/2 Hydralazine in the evening. She will start today. BP this am 156/73. ? ?ED MD prescribed Clonidine is case she needs it and became worried about her BP. She is not taking it. She is asking if should take it if her BP gets to a certain range? ?

## 2021-08-23 NOTE — Telephone Encounter (Signed)
-----   Message from Heath Lark, MD sent at 08/23/2021  9:44 AM EDT ----- ?Can you call to check on her? ?I think it is still a good idea to take lower dose hydralazine ?Maybe she can take 1/2 pill once a day in the evening and see if we can get her BP better controlled ? ?

## 2021-08-24 ENCOUNTER — Telehealth: Payer: Self-pay

## 2021-08-24 NOTE — Telephone Encounter (Signed)
Copy of form and request placed for pick-up as requested by Patient. ?

## 2021-08-24 NOTE — Telephone Encounter (Signed)
Returned call to Dr. Zenaida Niece at [facility] asking Dr. Alvy Bimler call her.  ?Per Dr. Alvy Bimler, she is unable to call. Please fax what is needed and the office will assist. ?Given fax #. ?

## 2021-08-24 NOTE — Telephone Encounter (Signed)
Notified Patient of completion of Cancer Information Form for [employer]. Fax transmission confirmation received. Request for records from 08/16/19 to present. These records were previously sent per Patient. Request for records from January 2022 to present are currently in process through [place]. No other needs or concerns voiced at this time.  ?

## 2021-08-27 ENCOUNTER — Telehealth: Payer: Self-pay

## 2021-08-27 ENCOUNTER — Encounter: Payer: Self-pay | Admitting: Hematology and Oncology

## 2021-08-27 NOTE — Telephone Encounter (Signed)
Called to get BP readings. She just sent a mychart message with readings. ?She is currently taking Hydrochlorothiazide 25 mg daily, Clonidine 0.1 mg evening and Atenolol 25 mg in the evening. ? ?Forwarded [employer]. ? ?Told her Dr. Alvy Bimler will talk with her at next appt regarding disability. ?

## 2021-08-27 NOTE — Telephone Encounter (Signed)
Called and left a message appt added Friday at 8 am on 8/12 prior to other appts. Instructed to arrive early for appt. Ask her to call the office back for questions,. ?

## 2021-08-27 NOTE — Telephone Encounter (Signed)
-----   Message from Heath Lark, MD sent at 08/27/2021  7:37 AM EDT ----- ?Can you call her for BP readings? ?If still high, we might have to cancel her infusion on Friday ? ?

## 2021-08-30 ENCOUNTER — Other Ambulatory Visit: Payer: Self-pay | Admitting: Hematology and Oncology

## 2021-08-31 ENCOUNTER — Inpatient Hospital Stay: Payer: Medicare Other

## 2021-08-31 ENCOUNTER — Inpatient Hospital Stay (HOSPITAL_BASED_OUTPATIENT_CLINIC_OR_DEPARTMENT_OTHER): Payer: Medicare Other | Admitting: Hematology and Oncology

## 2021-08-31 ENCOUNTER — Encounter: Payer: Self-pay | Admitting: Hematology and Oncology

## 2021-08-31 ENCOUNTER — Other Ambulatory Visit: Payer: Self-pay

## 2021-08-31 ENCOUNTER — Other Ambulatory Visit (HOSPITAL_COMMUNITY): Payer: Self-pay

## 2021-08-31 DIAGNOSIS — Z7189 Other specified counseling: Secondary | ICD-10-CM

## 2021-08-31 DIAGNOSIS — I10 Essential (primary) hypertension: Secondary | ICD-10-CM

## 2021-08-31 DIAGNOSIS — C5701 Malignant neoplasm of right fallopian tube: Secondary | ICD-10-CM

## 2021-08-31 DIAGNOSIS — Z5112 Encounter for antineoplastic immunotherapy: Secondary | ICD-10-CM | POA: Diagnosis not present

## 2021-08-31 LAB — TOTAL PROTEIN, URINE DIPSTICK: Protein, ur: <30 mg/dL — AB

## 2021-08-31 MED ORDER — SODIUM CHLORIDE 0.9 % IV SOLN
10.0000 mg/kg | Freq: Once | INTRAVENOUS | Status: AC
  Administered 2021-08-31: 700 mg via INTRAVENOUS
  Filled 2021-08-31: qty 16

## 2021-08-31 MED ORDER — CLONIDINE HCL 0.1 MG PO TABS: 0.1000 mg | ORAL_TABLET | Freq: Every day | ORAL | 1 refills | Status: DC

## 2021-08-31 MED ORDER — HEPARIN SOD (PORK) LOCK FLUSH 100 UNIT/ML IV SOLN
500.0000 [IU] | Freq: Once | INTRAVENOUS | Status: AC | PRN
  Administered 2021-08-31: 500 [IU]

## 2021-08-31 MED ORDER — SODIUM CHLORIDE 0.9% FLUSH
10.0000 mL | Freq: Once | INTRAVENOUS | Status: AC
  Administered 2021-08-31: 10 mL

## 2021-08-31 MED ORDER — SODIUM CHLORIDE 0.9% FLUSH
10.0000 mL | INTRAVENOUS | Status: DC | PRN
  Administered 2021-08-31: 10 mL

## 2021-08-31 MED ORDER — SODIUM CHLORIDE 0.9 % IV SOLN: Freq: Once | INTRAVENOUS | Status: AC

## 2021-08-31 NOTE — Assessment & Plan Note (Signed)
Her last imaging study showed no evidence of disease ?Her treatment is complicated by proteinuria and poorly controlled hypertension ?After delay and aggressive blood pressure management, her proteinuria has improved and blood pressure is better controlled ?I will renew her prescription antihypertensives ?She will proceed with treatment as scheduled ?I will see her again in a few weeks for further follow-up ?

## 2021-08-31 NOTE — Assessment & Plan Note (Signed)
Her documented blood pressure has improved recently ?I renew her prescription antihypertensives ?She will continue vigilant blood pressure monitoring and will call me if her blood pressure start coming up again ?

## 2021-08-31 NOTE — Progress Notes (Signed)
[place] ?OFFICE PROGRESS NOTE ? ?Patient Care Team: ?Gaynelle Arabian, MD as PCP - General (Family Medicine) ?Heath Lark, MD as Consulting Physician (Hematology and Oncology) ?Everitt Amber, MD as Consulting Physician (Obstetrics and Gynecology) ? 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 ?Essential hypertension ?Her documented blood pressure has improved recently ?I renew her prescription antihypertensives ?She will continue vigilant blood pressure monitoring and will call me if her blood pressure start coming up again ? ?No orders of the defined types were placed in this encounter. ? ? ?All questions were answered. The patient knows to call the clinic with any problems, questions or concerns. ?The total time spent in the appointment was 20 minutes encounter with patients including review of chart and various tests results, discussions about plan of care and coordination of care plan ?  ?Heath Lark, MD ?08/31/2021 11:48 AM ? ?INTERVAL HISTORY: ?Please see below for problem oriented charting. ?she returns for treatment follow-up with her husband ?Her blood pressure has improved since addition of clonidine and discontinuation of hydralazine ?On average, systolic blood pressure is between 116-1 41 ?Her heart rate is between 51-61 ?She denies recent headaches ? ?REVIEW OF SYSTEMS:   ?Constitutional: Denies fevers, chills or abnormal weight loss ?Eyes: Denies blurriness of vision ?Ears, nose, mouth, throat, and face: Denies mucositis or sore throat ?Respiratory: Denies  cough, dyspnea or wheezes ?Cardiovascular: Denies palpitation, chest discomfort or lower extremity swelling ?Gastrointestinal:  Denies nausea, heartburn or change in bowel habits ?Skin: Denies abnormal skin rashes ?Lymphatics: Denies new lymphadenopathy or easy bruising ?Neurological:Denies numbness, tingling or new weaknesses ?Behavioral/Psych: Mood is stable, no new changes  ?All other systems were reviewed with the patient and are negative. ? ?I have reviewed the past medical history, past surgical history, social history and family history with the patient and they are unchanged from previous note. ? ?ALLERGIES:  is allergic to dilaudid [hydromorphone hcl], morphine and related, and sudafed [pseudoephedrine hcl]. ? ?MEDICATIONS:  ?Current Outpatient Medications  ?Medication Sig Dispense Refill  ? acetaminophen (TYLENOL) 325 MG tablet Take 650 mg by mouth every 6 (six) hours as needed for headache (pain).    ? atenolol (TENORMIN) 25 MG tablet TAKE 1 TABLET (25 MG TOTAL) BY MOUTH DAILY. 30 tablet 1  ? cloNIDine (CATAPRES) 0.1 MG tablet Take 1 tablet (0.1 mg total) by mouth daily. 30 tablet 1  ? hydrochlorothiazide (HYDRODIURIL) 25 MG tablet Take 1 tablet (25 mg total) by mouth daily. 30 tablet 3  ? lidocaine-prilocaine (EMLA) cream Apply to affected area once daily as directed. (Patient taking differently: 1 application. as needed (access port). Apply to affected area once daily as directed.) 30 g 3  ? lisinopril (ZESTRIL) 20 MG tablet TAKE 1 TABLET(20 MG) BY MOUTH DAILY (Patient taking differently: Take 20 mg by mouth every evening.) 90 tablet 3  ? ondansetron (ZOFRAN) 8 MG tablet Take 1 tablet (8 mg total) by mouth every 8 (eight) hours as needed. (Patient not taking: Reported on 05/21/2021) 30 tablet 1  ? prochlorperazine (COMPAZINE) 10 MG tablet TAKE 1 TABLET BY MOUTH EVERY 6 HOURS AS NEEDED FOR NAUSEA AND/OR  VOMITING (Patient not taking: Reported on 05/21/2021) 30 tablet 1  ? venlafaxine XR (EFFEXOR-XR) 37.5 MG  24 hr capsule TAKE 1 CAPSULE BY MOUTH DAILY WITH BREAKFAST 90 capsule 3  ? ?No current facility-administered medications for this visit.  ? ?Facility-Administered Medications Ordered in Other Visits  ?Medication Dose Route Frequency Provider Last Rate Last Admin  ? sodium chloride flush (NS) 0.9 % injection 10 mL  10 mL Intravenous PRN Heath Lark, MD   10 mL at 02/11/17 0839  ? sodium chloride flush (NS) 0.9 % injection 10 mL  10 mL Intravenous PRN Alvy Bimler, Keeven Matty, MD   10 mL at 03/04/17 1510  ? sodium chloride flush (NS) 0.9 % injection 10 mL  10 mL Intracatheter PRN Heath Lark, MD   10 mL at 08/31/21 1008  ? ? ?SUMMARY OF ONCOLOGIC HISTORY: ?Oncology History Overview Note  ?Neg genetics ? ?  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 The patient has many months of vague symptoms of fullness in the pelvis, urinary frequency and difficulty with defecation. She denies vaginal bleeding or rectal bleeding. ?  ? ?  ?08/12/2016 Imaging  ? Abdomen X-ray in the ER: ?Moderate colonic stool burden without evidence of enteric obstruction ? ?  ?11/13/2016 Imaging  ? TVUS was performed on 11/13/16 which showed a large hypoechoic lobular mass seen posterior to the uterus measuring 18.2x16.1x11.8cm, blood flow was seen along the periphery of the mass. It was considered to be likely to be a fibroid vs pelvic mass vs ovarian mass. Neither ovary was removed. Moderate amount of free fluid in the pelvis. THe uterus measures 4x10x4cm. The endometrium is 65m. ? ?  ?12/05/2016 Imaging  ? MR pelvis ?1. Mild limitations as detailed above. ?2. Heterogeneous pelvic mass is favored to arise from the right ovary. Favor a solid ovarian neoplasm such as fibroma/Brenner's tumor. Given lesion size and heterogeneous T2 signal out, complicating torsion cannot be excluded. ?3. Moderate abdominopelvic ascites, without specific evidence of peritoneal metastasis. Consider further evaluation with contrast-enhanced  abdominopelvic CT.  ? ?  ?12/26/2016 Pathology Results  ? 1. Uterus, ovaries and fallopian tubes ?- ADENOCARCINOMA INVOLVING RIGHT FALLOPIAN TUBE, RIGHT AND LEFT OVARIES, UTERINE SEROSA AND ?PELVIC MASS. ?- SEE ONCOLOGY TABLE AND COMMENT. ?- UTERINE CERVIX, ENDOMETRIUM, MYOMETRIUM AND LEFT FALLOPIAN TUBE FREE OF TUMOR. ?2. Omentum, resection for tumor ?- ADENOCARCINOMA. ?Microscopic Comment ?1. ONCOLOGY TABLE - FALLOPIAN TUBE. SEE COMMENT ?1. Specimen, including laterality: Uterus, bilateral adnexa, pelvic mass and omentum. ?2. Procedure: Hysterectomy with bilateral salpingo-oophorectomy, pelvic mass excision and omentectomy. ?3. Lymph node sampling performed: No ?4. Tumor site: See comment. ?5. Tumor location in fallopian tube: Right fallopian tube fimbria ?6. Specimen integrity (intact/ruptured/disrupted): Intact ?7. Tumor size (cm): 18 cm, see comment. ?8. Histologic type: Adenocarcinoma ?9. Grade: High grade ?10. Microscopic tumor extension: Tumor involves right fallopian tube, right and left ovaries, uterine serosa, pelvic ?mass and omentum. ?11. Margins: See comment. ?12. Lymph-Vascular invasion: Present ?13. Lymph nodes: # examined: 0; # positive: N/A ?14. TNM: pT3c, pNX ?15. FIGO Stage (based on pathologic findings, needs clinical correlation: III-C ?16. Comments: There is an 18 cm pelvic mass which is a high grade adenocarcinoma and there is a 12.2 cm  segment of omentum which is extensively involved with adenocarcinoma. The tumor also involves the fimbria of the right fallopian tube and the parenchyma of the right and left ovaries as well as uterine serosa. The fimbria of the right fallopian has intraepithelial atypia consistent  with a precursor lesion and therefore this is most consistent with primary fallopian tube adenocarcinoma. A primary peritoneal serous carcinoma is a less likely possibility.  ? ?  ?12/26/2016 Pathology Results  ? PERITONEAL/ASCITIC FLUID(SPECIMEN 1 OF 1 COLLECTED 12/26/16): ?POORLY  DIFFERENTIATED ADENOCARCINOMA ? ?  ?12/26/2016 Surgery  ? Procedure(s) Performed: Exploratory laparotomy with total abdominal hysterectomy, bilateral salpingo-oophorectomy, omentectomy radical tumor debulking for ovarian

## 2021-09-21 ENCOUNTER — Inpatient Hospital Stay: Payer: Medicare Other

## 2021-09-21 ENCOUNTER — Encounter: Payer: Self-pay | Admitting: Hematology and Oncology

## 2021-09-21 ENCOUNTER — Other Ambulatory Visit: Payer: Self-pay

## 2021-09-21 ENCOUNTER — Inpatient Hospital Stay: Payer: Medicare Other | Attending: Hematology and Oncology | Admitting: Hematology and Oncology

## 2021-09-21 VITALS — BP 161/73 | HR 48 | Temp 97.7°F | Resp 18 | Ht 65.0 in | Wt 151.2 lb

## 2021-09-21 DIAGNOSIS — C5701 Malignant neoplasm of right fallopian tube: Secondary | ICD-10-CM | POA: Diagnosis present

## 2021-09-21 DIAGNOSIS — N183 Chronic kidney disease, stage 3 unspecified: Secondary | ICD-10-CM | POA: Insufficient documentation

## 2021-09-21 DIAGNOSIS — Z5112 Encounter for antineoplastic immunotherapy: Secondary | ICD-10-CM | POA: Diagnosis present

## 2021-09-21 DIAGNOSIS — Z7189 Other specified counseling: Secondary | ICD-10-CM

## 2021-09-21 DIAGNOSIS — Z79899 Other long term (current) drug therapy: Secondary | ICD-10-CM | POA: Insufficient documentation

## 2021-09-21 DIAGNOSIS — I10 Essential (primary) hypertension: Secondary | ICD-10-CM

## 2021-09-21 LAB — COMPREHENSIVE METABOLIC PANEL
ALT: 6 U/L (ref 0–44)
AST: 13 U/L — ABNORMAL LOW (ref 15–41)
Albumin: 3.5 g/dL (ref 3.5–5.0)
Alkaline Phosphatase: 60 U/L (ref 38–126)
Anion gap: 5 (ref 5–15)
BUN: 22 mg/dL (ref 8–23)
CO2: 25 mmol/L (ref 22–32)
Calcium: 9.1 mg/dL (ref 8.9–10.3)
Chloride: 99 mmol/L (ref 98–111)
Creatinine, Ser: 1.16 mg/dL — ABNORMAL HIGH (ref 0.44–1.00)
GFR, Estimated: 52 mL/min — ABNORMAL LOW (ref >60)
Glucose, Bld: 100 mg/dL — ABNORMAL HIGH (ref 70–99)
Potassium: 4.9 mmol/L (ref 3.5–5.1)
Sodium: 129 mmol/L — ABNORMAL LOW (ref 135–145)
Total Bilirubin: 0.6 mg/dL (ref 0.3–1.2)
Total Protein: 6.1 g/dL — ABNORMAL LOW (ref 6.5–8.1)

## 2021-09-21 LAB — CBC WITH DIFFERENTIAL/PLATELET
Abs Immature Granulocytes: 0.03 10*3/uL (ref 0.00–0.07)
Basophils Absolute: 0.1 10*3/uL (ref 0.0–0.1)
Basophils Relative: 1 %
Eosinophils Absolute: 0.5 10*3/uL (ref 0.0–0.5)
Eosinophils Relative: 7 %
HCT: 39.1 % (ref 36.0–46.0)
Hemoglobin: 12.8 g/dL (ref 12.0–15.0)
Immature Granulocytes: 0 %
Lymphocytes Relative: 15 %
Lymphs Abs: 1.1 10*3/uL (ref 0.7–4.0)
MCH: 30.1 pg (ref 26.0–34.0)
MCHC: 32.7 g/dL (ref 30.0–36.0)
MCV: 92 fL (ref 80.0–100.0)
Monocytes Absolute: 0.6 10*3/uL (ref 0.1–1.0)
Monocytes Relative: 8 %
Neutro Abs: 5 10*3/uL (ref 1.7–7.7)
Neutrophils Relative %: 69 %
Platelets: 234 10*3/uL (ref 150–400)
RBC: 4.25 MIL/uL (ref 3.87–5.11)
RDW: 13.8 % (ref 11.5–15.5)
WBC: 7.4 10*3/uL (ref 4.0–10.5)
nRBC: 0 % (ref 0.0–0.2)

## 2021-09-21 LAB — TOTAL PROTEIN, URINE DIPSTICK: Protein, ur: 30 mg/dL — AB

## 2021-09-21 MED ORDER — SODIUM CHLORIDE 0.9 % IV SOLN: Freq: Once | INTRAVENOUS | Status: AC

## 2021-09-21 MED ORDER — HEPARIN SOD (PORK) LOCK FLUSH 100 UNIT/ML IV SOLN
500.0000 [IU] | Freq: Once | INTRAVENOUS | Status: AC | PRN
  Administered 2021-09-21: 500 [IU]

## 2021-09-21 MED ORDER — SODIUM CHLORIDE 0.9% FLUSH
10.0000 mL | INTRAVENOUS | Status: DC | PRN
  Administered 2021-09-21: 10 mL

## 2021-09-21 MED ORDER — SODIUM CHLORIDE 0.9 % IV SOLN
7.5000 mg/kg | Freq: Once | INTRAVENOUS | Status: AC
  Administered 2021-09-21: 500 mg via INTRAVENOUS
  Filled 2021-09-21: qty 16

## 2021-09-21 MED ORDER — SODIUM CHLORIDE 0.9% FLUSH
10.0000 mL | Freq: Once | INTRAVENOUS | Status: AC
  Administered 2021-09-21: 10 mL

## 2021-09-21 NOTE — Progress Notes (Signed)
[facility] OFFICE PROGRESS NOTE  Patient Care Team: Gaynelle Arabian, MD as PCP - General (Family Medicine) Heath Lark, MD as Consulting Physician (Hematology and Oncology) Everitt Amber, MD as Consulting Physician (Obstetrics and Gynecology)  ASSESSMENT & PLAN:  Adenocarcinoma of right fallopian tube [facility]) She is due for CT imaging She is not symptomatic We will proceed with maintenance treatment with bevacizumab but I recommend reduced dose due to recent uncontrolled hypertension She is in agreement  Essential hypertension Her blood pressure is better controlled with recent introduction of clonidine She will continue her current prescribed antihypertensive and will continue close monitoring of her blood pressure at home As above, I recommend reduced dose treatment  CKD (chronic kidney disease), stage III ([place]) She has chronic kidney disease stage III, stable We discussed importance of adequate hydration and aggressive blood pressure control  Orders Placed This Encounter  Procedures   CT CHEST ABDOMEN PELVIS W CONTRAST    Standing Status:   Future    Standing Expiration Date:   09/22/2022    Order Specific Question:   Preferred imaging location?    Answer:   [facility] [place]    Order Specific Question:   Radiology Contrast Protocol - do NOT remove file path    Answer:   [email or web address]    All questions were answered. The patient knows to call the clinic with any problems, questions or concerns. The total time spent in the appointment was 30 minutes encounter with patients including review of chart and various tests results, discussions about plan of care and coordination of care plan   Heath Lark, MD 09/21/2021 9:24 AM  INTERVAL HISTORY: Please see below for problem oriented charting. she returns for treatment follow-up with her husband Her documented blood pressure from home were within normal range As for some  reason, her blood pressure is elevated today She complains of feeling thirsty most of the time Denies abdominal pain or changes in bowel habits  REVIEW OF SYSTEMS:   Constitutional: Denies fevers, chills or abnormal weight loss Eyes: Denies blurriness of vision Ears, nose, mouth, throat, and face: Denies mucositis or sore throat Respiratory: Denies cough, dyspnea or wheezes Cardiovascular: Denies palpitation, chest discomfort or lower extremity swelling Gastrointestinal:  Denies nausea, heartburn or change in bowel habits Skin: Denies abnormal skin rashes Lymphatics: Denies new lymphadenopathy or easy bruising Neurological:Denies numbness, tingling or new weaknesses Behavioral/Psych: Mood is stable, no new changes  All other systems were reviewed with the patient and are negative.  I have reviewed the past medical history, past surgical history, social history and family history with the patient and they are unchanged from previous note.  ALLERGIES:  is allergic to dilaudid [hydromorphone hcl], morphine and related, and sudafed [pseudoephedrine hcl].  MEDICATIONS:  Current Outpatient Medications  Medication Sig Dispense Refill   acetaminophen (TYLENOL) 325 MG tablet Take 650 mg by mouth every 6 (six) hours as needed for headache (pain).     atenolol (TENORMIN) 25 MG tablet TAKE 1 TABLET (25 MG TOTAL) BY MOUTH DAILY. 30 tablet 1   cloNIDine (CATAPRES) 0.1 MG tablet Take 1 tablet (0.1 mg total) by mouth daily. 30 tablet 1   hydrochlorothiazide (HYDRODIURIL) 25 MG tablet Take 1 tablet (25 mg total) by mouth daily. 30 tablet 3   lidocaine-prilocaine (EMLA) cream Apply to affected area once daily as directed. (Patient taking differently: 1 application. as needed (access port). Apply to affected area once daily as directed.) 30 g 3   lisinopril (ZESTRIL)  20 MG tablet TAKE 1 TABLET(20 MG) BY MOUTH DAILY (Patient taking differently: Take 20 mg by mouth every evening.) 90 tablet 3   ondansetron  (ZOFRAN) 8 MG tablet Take 1 tablet (8 mg total) by mouth every 8 (eight) hours as needed. (Patient not taking: Reported on 05/21/2021) 30 tablet 1   prochlorperazine (COMPAZINE) 10 MG tablet TAKE 1 TABLET BY MOUTH EVERY 6 HOURS AS NEEDED FOR NAUSEA AND/OR VOMITING (Patient not taking: Reported on 05/21/2021) 30 tablet 1   venlafaxine XR (EFFEXOR-XR) 37.5 MG 24 hr capsule TAKE 1 CAPSULE BY MOUTH DAILY WITH BREAKFAST 90 capsule 3   No current facility-administered medications for this visit.   Facility-Administered Medications Ordered in Other Visits  Medication Dose Route Frequency Provider Last Rate Last Admin   bevacizumab-bvzr (ZIRABEV) 500 mg in sodium chloride 0.9 % 100 mL chemo infusion  7.5 mg/kg (Treatment Plan Recorded) Intravenous Once Alvy Bimler, Iline Buchinger, MD       heparin lock flush 100 unit/mL  500 Units Intracatheter Once PRN Alvy Bimler, Sima Lindenberger, MD       sodium chloride flush (NS) 0.9 % injection 10 mL  10 mL Intravenous PRN Alvy Bimler, Jaxn Chiquito, MD   10 mL at 02/11/17 0839   sodium chloride flush (NS) 0.9 % injection 10 mL  10 mL Intravenous PRN Alvy Bimler, Jaqualin Serpa, MD   10 mL at 03/04/17 1510   sodium chloride flush (NS) 0.9 % injection 10 mL  10 mL Intracatheter PRN Heath Lark, MD        SUMMARY OF ONCOLOGIC HISTORY: Oncology History Overview Note  Neg genetics    Adenocarcinoma of right fallopian tube ([place])  08/12/2016 Initial Diagnosis   The patient has many months of vague symptoms of fullness in the pelvis, urinary frequency and difficulty with defecation. She denies vaginal bleeding or rectal bleeding.      08/12/2016 Imaging   Abdomen X-ray in the ER: Moderate colonic stool burden without evidence of enteric obstruction    11/13/2016 Imaging   TVUS was performed on 11/13/16 which showed a large hypoechoic lobular mass seen posterior to the uterus measuring 18.2x16.1x11.8cm, blood flow was seen along the periphery of the mass. It was considered to be likely to be a fibroid vs pelvic mass vs ovarian  mass. Neither ovary was removed. Moderate amount of free fluid in the pelvis. THe uterus measures 4x10x4cm. The endometrium is 56m.    12/05/2016 Imaging   MR pelvis 1. Mild limitations as detailed above. 2. Heterogeneous pelvic mass is favored to arise from the right ovary. Favor a solid ovarian neoplasm such as fibroma/Brenner's tumor. Given lesion size and heterogeneous T2 signal out, complicating torsion cannot be excluded. 3. Moderate abdominopelvic ascites, without specific evidence of peritoneal metastasis. Consider further evaluation with contrast-enhanced abdominopelvic CT.     12/26/2016 Pathology Results   1. Uterus, ovaries and fallopian tubes - ADENOCARCINOMA INVOLVING RIGHT FALLOPIAN TUBE, RIGHT AND LEFT OVARIES, UTERINE SEROSA AND PELVIC MASS. - SEE ONCOLOGY TABLE AND COMMENT. - UTERINE CERVIX, ENDOMETRIUM, MYOMETRIUM AND LEFT FALLOPIAN TUBE FREE OF TUMOR. 2. Omentum, resection for tumor - ADENOCARCINOMA. Microscopic Comment 1. ONCOLOGY TABLE - FALLOPIAN TUBE. SEE COMMENT 1. Specimen, including laterality: Uterus, bilateral adnexa, pelvic mass and omentum. 2. Procedure: Hysterectomy with bilateral salpingo-oophorectomy, pelvic mass excision and omentectomy. 3. Lymph node sampling performed: No 4. Tumor site: See comment. 5. Tumor location in fallopian tube: Right fallopian tube fimbria 6. Specimen integrity (intact/ruptured/disrupted): Intact 7. Tumor size (cm): 18 cm, see comment. 8. Histologic type: Adenocarcinoma 9.  Grade: High grade 10. Microscopic tumor extension: Tumor involves right fallopian tube, right and left ovaries, uterine serosa, pelvic mass and omentum. 11. Margins: See comment. 12. Lymph-Vascular invasion: Present 13. Lymph nodes: # examined: 0; # positive: N/A 14. TNM: pT3c, pNX 15. FIGO Stage (based on pathologic findings, needs clinical correlation: III-C 16. Comments: There is an 18 cm pelvic mass which is a high grade adenocarcinoma and there is a  12.2 cm  segment of omentum which is extensively involved with adenocarcinoma. The tumor also involves the fimbria of the right fallopian tube and the parenchyma of the right and left ovaries as well as uterine serosa. The fimbria of the right fallopian has intraepithelial atypia consistent with a precursor lesion and therefore this is most consistent with primary fallopian tube adenocarcinoma. A primary peritoneal serous carcinoma is a less likely possibility.     12/26/2016 Pathology Results   PERITONEAL/ASCITIC FLUID(SPECIMEN 1 OF 1 COLLECTED 12/26/16): POORLY DIFFERENTIATED ADENOCARCINOMA    12/26/2016 Surgery   Procedure(s) Performed: Exploratory laparotomy with total abdominal hysterectomy, bilateral salpingo-oophorectomy, omentectomy radical tumor debulking for ovarian cancer .   Surgeon: Thereasa Solo, MD.      Operative Findings: 20cm left ovarian mass densely adherent to the posterior uterus, right tube and ovary, cervix, sigmoid colon. 10cm omental cake. 4L ascites. 27m size tumor nodules on the serosa of the terminal ileum and proximal sigmoid colon. 136mnodules on right diaphragm.    This represented an optimal cytoreduction (R1) with 41m41modules on intestine and diaphragm representing gross visible disease    01/16/2017 Procedure   Placement of single lumen port a cath via right internal jugular vein. The catheter tip lies at the cavo-atrial junction. A power injectable port a cath was placed and is ready for immediate use.    01/17/2017 Imaging   1. 3.5 x 7.2 x 4.6 cm fluid collection along the vaginal cuff, posterior the bladder and extending into the right adnexal space. This lesion demonstrates rim enhancement. Imaging features could be related to a loculated postoperative seroma or hematoma. Superinfection cannot be excluded by CT. Given debulking surgery was 3 weeks ago, this entire structure is un likely to represent neoplasm, but peritoneal involvement could have this  appearance. 2. Small fluid collection in the left para colic gutter without rim enhancement. 3. Irregular/nodular appearance of the peritoneal M in the anatomic pelvis with areas of subtle nodularity between the stomach and the spleen. Close attention in these regions on follow-up recommended as metastatic disease is a concern. 4. Mildly enlarged hepatoduodenal ligament lymph node. Attention on follow-up recommended.    01/20/2017 Tumor Marker   Patient's tumor was tested for the following markers: CA-125 Results of the tumor marker test revealed 46.6    01/28/2017 Genetic Testing       Negative genetic testing on the [facility].  The [facility] panel offered by [employer] [employer] sequencing and deletion/duplication testing of the following 28 genes: APC, ATM, BARD1, BMPR1A, BRCA1, BRCA2, BRIP1, CHD1, CDK4, CDKN2A, CHEK2, EPCAM (large rearrangement only), MLH1, MSH2, MSH6, MUTYH, NBN, PALB2, PMS2, PTEN, RAD51C, RAD51D, SMAD4, STK11, and TP53. Sequencing was performed for select regions of POLE and POLD1, and large rearrangement analysis was performed for select regions of GREM1. The report date is January 28, 2017.  HRD testing looking for genomic instability and BRCA mutations was negative.  The report date of this test is January 27, 2017.     01/31/2017 Tumor Marker   Patient's tumor  was tested for the following markers: CA-125 Results of the tumor marker test revealed 22.4    03/04/2017 Tumor Marker   Patient's tumor was tested for the following markers: CA-125 Results of the tumor marker test revealed 13.8    04/18/2017 Tumor Marker   Patient's tumor was tested for the following markers: CA-125 Results of the tumor marker test revealed 11.9    05/05/2017 Tumor Marker   Patient's tumor was tested for the following markers: CA-125 Results of the tumor marker test revealed 12    05/26/2017 Tumor Marker   Patient's tumor was tested for the following  markers: CA-125 Results of the tumor marker test revealed 10.6    05/26/2017 Imaging   1. A previously noted postoperative fluid collection in the low pelvis and residual ascites seen on the prior examination has resolved on today's study. No findings to suggest residual/recurrent disease on today's examination. No definite solid organ metastasis identified in the abdomen or pelvis. No lymphadenopathy. 2. Aortic atherosclerosis.    06/10/2017 Procedure   Successful right IJ vein Port-A-Cath explant.    03/09/2018 Tumor Marker   Patient's tumor was tested for the following markers: CA-125 Results of the tumor marker test revealed 12.1   05/26/2018 Tumor Marker   Patient's tumor was tested for the following markers: CA-125 Results of the tumor marker test revealed 13.8   09/09/2018 Tumor Marker   Patient's tumor was tested for the following markers: CA-125 Results of the tumor marker test revealed 23.9   12/18/2018 Tumor Marker   Patient's tumor was tested for the following markers: CA-125 Results of the tumor marker test revealed 43.8   02/27/2019 - 02/28/2019 Hospital Admission   She was admitted for bowel obstruction   02/27/2019 Imaging   1. High-grade small bowel obstruction with transition point in the pelvis in an area of suspected desmoplastic reaction surrounding a 1.3 cm spiculated nodule in the mesentery, suspicious for carcinoid. There is hyperenhancement near the tip of the appendix and loss of the normal fat plane between it and the adjacent sigmoid colon, potentially the site of primary lesion. 2. Multiple new small subcentimeter hyperdense lesions scattered along the liver capsule, concerning for metastases. Enlarged hyperenhancing gastrohepatic and portacaval lymph nodes concerning for nodal metastases. 3. Very mild right hydroureteronephrosis to the level of the desmoplastic reaction in the pelvis, potentially involving the right ureter. 4. Infraumbilical ventral abdominal  wall diastasis containing a small nondilated portion of transverse colon. 5. Trace ascites. 6. Cholelithiasis. 7.  Aortic atherosclerosis (ICD10-I70.0).     03/05/2019 Tumor Marker   Patient's tumor was tested for the following markers: CA-125 Results of the tumor marker test revealed 70.1   03/12/2019 Echocardiogram   IMPRESSIONS     1. Left ventricular ejection fraction, by visual estimation, is 60 to 65%. The left ventricle has normal function. There is no left ventricular hypertrophy.  2. Left ventricular diastolic parameters are consistent with Grade I diastolic dysfunction (impaired relaxation).  3. Global right ventricle has normal systolic function.The right ventricular size is normal. No increase in right ventricular wall thickness.  4. Left atrial size was normal.  5. Right atrial size was normal.  6. The pericardium was not assessed.  7. The mitral valve is normal in structure. No evidence of mitral valve regurgitation.  8. The tricuspid valve is normal in structure. Tricuspid valve regurgitation is trivial.  9. The aortic valve is normal in structure. Aortic valve regurgitation is not visualized. 10. The pulmonic valve was  grossly normal. Pulmonic valve regurgitation is not visualized. 11. The average left ventricular global longitudinal strain is -17.6 %.   03/16/2019 Procedure   Successful placement of a right internal jugular approach power injectable Port-A-Cath. The catheter is ready for immediate use.     04/19/2019 Tumor Marker   Patient's tumor was tested for the following markers: CA-125 Results of the tumor marker test revealed 39.8   05/17/2019 Tumor Marker   Patient's tumor was tested for the following markers: CA-125 Results of the tumor marker test revealed 27   05/28/2019 Tumor Marker   Patient's tumor was tested for the following markers: CA-125 Results of the tumor marker test revealed 27.7.   06/11/2019 Imaging   1. No new or progressive metastatic  disease in the abdomen or pelvis. 2. Tiny noncalcified perisplenic implant is decreased. Calcified pericardiophrenic, retroperitoneal and bilateral inguinal lymph nodes and scattered small calcified perihepatic and perisplenic implants are stable. 3.  Aortic Atherosclerosis (ICD10-I70.0).       06/17/2019 Echocardiogram    1. Left ventricular ejection fraction, by estimation, is 65 to 70%. The left ventricle has normal function. The left ventricle has no regional wall motion abnormalities. Left ventricular diastolic parameters were normal.  2. Right ventricular systolic function is normal. The right ventricular size is normal.  3. The mitral valve is normal in structure and function. No evidence of mitral valve regurgitation. No evidence of mitral stenosis.  4. The aortic valve is normal in structure and function. Aortic valve regurgitation is not visualized. No aortic stenosis is present.   06/21/2019 Tumor Marker   Patient's tumor was tested for the following markers: CA-125 Results of the tumor marker test revealed 18.7   07/19/2019 Tumor Marker   Patient's tumor was tested for the following markers: CA-125 Results of the tumor marker test revealed 16.7   08/30/2019 Tumor Marker   Patient's tumor was tested for the following markers: CA-125. Results of the tumor marker test revealed 13.9   09/15/2019 Echocardiogram    1. Left ventricular ejection fraction, by estimation, is 65 to 70%. The left ventricle has normal function. The left ventricle has no regional wall motion abnormalities. Left ventricular diastolic parameters are consistent with Grade I diastolic dysfunction (impaired relaxation). The average left ventricular global longitudinal strain is 18.1 %. The global longitudinal strain is normal.  2. Right ventricular systolic function is normal. The right ventricular size is normal.  3. The mitral valve is normal in structure. No evidence of mitral valve regurgitation. No evidence of  mitral stenosis.  4. The aortic valve is normal in structure. Aortic valve regurgitation is not visualized. No aortic stenosis is present.  5. The inferior vena cava is normal in size with greater than 50% respiratory variability, suggesting right atrial pressure of 3 mmHg.     09/24/2019 Imaging   1. Stable exam. No new or progressive metastatic disease on today's study. 2. Small subcapsular hypodensity in the lateral spleen, new in the interval, but indeterminate. Attention on follow-up recommended. 3. The calcified upper abdominal and groin lymph nodes are stable as are the scattered calcified and noncalcified peritoneal implants. 4. Stable midline ventral hernia containing a short segment of colon without complicating features. 5. Aortic Atherosclerosis (ICD10-I70.0).     10/04/2019 -  Chemotherapy   Patient is on Treatment Plan : ovarian Bevacizumab q21d      10/04/2019 Tumor Marker   Patient's tumor was tested for the following markers: CA-125. Results of the tumor marker test revealed  12.9.   11/01/2019 Tumor Marker   Patient's tumor was tested for the following markers: CA-125 Results of the tumor marker test revealed 15.4   12/13/2019 Tumor Marker   Patient's tumor was tested for the following markers: CA-125 Results of the tumor marker test revealed 14.8   12/31/2019 Imaging   1. Unchanged tiny peritoneal nodule adjacent to the spleen measuring 6 mm. Other previously noted peritoneal nodules are imperceptible on present examination. 2. Unchanged prominent, partially calcified portacaval lymph node and bilateral inguinal lymph nodes. 3. Unchanged subtle, nonspecific subcapsular lesion of the spleen.  4. No evidence of new metastatic disease in the abdomen or pelvis. 5. Status post hysterectomy. 6. Unchanged low midline ventral abdominal hernia containing a single loop of nonobstructed transverse colon. 7. Cholelithiasis. 8. Aortic Atherosclerosis (ICD10-I70.0).        01/03/2020 Tumor Marker   Patient's tumor was tested for the following markers: CA-125 Results of the tumor marker test revealed 11.7.   01/24/2020 Tumor Marker   Patient's tumor was tested for the following markers: CA-125. Results of the tumor marker test revealed 15.1   03/06/2020 Tumor Marker   Patient's tumor was tested for the following markers: CA-125. Results of the tumor marker test revealed 20.3   03/27/2020 Tumor Marker   Patient's tumor was tested for the following markers: CA-125 Results of the tumor marker test revealed 23.7   05/11/2020 Tumor Marker   Patient's tumor was tested for the following markers: CA-125. Results of the tumor marker test revealed 25.4   06/23/2020 Imaging   1. Cholelithiasis with gallbladder wall thickening and mild infiltrative edema in the porta hepatis, raising the possibility of acute cholecystitis. There is truncation of the common bile duct distally and distal choledocholithiasis is difficult to exclude. Correlate with bilirubin levels. If clinically warranted, MRCP could be utilized for further characterization. 2. Stable tiny perisplenic nodule. 3. Other imaging findings of potential clinical significance: Small type 1 hiatal hernia. Prominent stool throughout the colon favors constipation. Ventral infraumbilical hernia contains transverse colon without findings of strangulation or obstruction. Small supraumbilical hernia contains adipose tissue. Lumbar degenerative disc disease at L5-S1. Stable tiny nodule along the lateral margin of the spleen, nonspecific. 4. Aortic atherosclerosis.     12/15/2020 Imaging   No evidence of recurrent or metastatic carcinoma within the abdomen or pelvis.   Stable ventral hernia containing transverse colon. No evidence of bowel obstruction or strangulation.   Cholelithiasis. No radiographic evidence of cholecystitis.   Tiny hiatal hernia.   Aortic Atherosclerosis (ICD10-I70.0).     06/11/2021 Imaging    1. Numerous sub 4 mm pulmonary nodules in the lungs some of which have a cavitary appearance. I think it is unlikely this is metastatic disease and more likely a possible immunotherapy related process such as a sarcoid like reaction. Recommend full chest CT further evaluation. 2. Stable small calcified retroperitoneal and mesenteric lymph nodes. No findings suspicious for recurrent peritoneal carcinomatosis. 3. Stable lower abdominal wall hernia containing part of the transverse colon. 4. Cholelithiasis.     PHYSICAL EXAMINATION: ECOG PERFORMANCE STATUS: 0 - Asymptomatic  Vitals:   09/21/21 0857  BP: (!) 161/73  Pulse: (!) 48  Resp: 18  Temp: 97.7 F (36.5 C)  SpO2: 100%   Filed Weights   09/21/21 0857  Weight: 151 lb 3.2 oz (68.6 kg)    GENERAL:alert, no distress and comfortable SKIN: skin color, texture, turgor are normal, no rashes or significant lesions EYES: normal, Conjunctiva are pink and non-injected,  sclera clear OROPHARYNX:no exudate, no erythema and lips, buccal mucosa, and tongue normal  NECK: supple, thyroid normal size, non-tender, without nodularity LYMPH:  no palpable lymphadenopathy in the cervical, axillary or inguinal LUNGS: clear to auscultation and percussion with normal breathing effort HEART: regular rate & rhythm and no murmurs and no lower extremity edema ABDOMEN:abdomen soft, non-tender and normal bowel sounds Musculoskeletal:no cyanosis of digits and no clubbing  NEURO: alert & oriented x 3 with fluent speech, no focal motor/sensory deficits  LABORATORY DATA:  I have reviewed the data as listed    Component Value Date/Time   NA 129 (L) 09/21/2021 0830   NA 139 04/14/2017 0742   K 4.9 09/21/2021 0830   K 4.0 04/14/2017 0742   CL 99 09/21/2021 0830   CO2 25 09/21/2021 0830   CO2 21 (L) 04/14/2017 0742   GLUCOSE 100 (H) 09/21/2021 0830   GLUCOSE 247 (H) 04/14/2017 0742   BUN 22 09/21/2021 0830   BUN 13.1 04/14/2017 0742   CREATININE 1.16 (H)  09/21/2021 0830   CREATININE 0.99 03/12/2021 0832   CREATININE 0.9 04/14/2017 0742   CALCIUM 9.1 09/21/2021 0830   CALCIUM 9.2 04/14/2017 0742   PROT 6.1 (L) 09/21/2021 0830   PROT 7.3 04/14/2017 0742   ALBUMIN 3.5 09/21/2021 0830   ALBUMIN 3.9 04/14/2017 0742   AST 13 (L) 09/21/2021 0830   AST 18 03/12/2021 0832   AST 17 04/14/2017 0742   ALT 6 09/21/2021 0830   ALT 9 03/12/2021 0832   ALT 14 04/14/2017 0742   ALKPHOS 60 09/21/2021 0830   ALKPHOS 70 04/14/2017 0742   BILITOT 0.6 09/21/2021 0830   BILITOT 0.8 03/12/2021 0832   BILITOT 0.37 04/14/2017 0742   GFRNONAA 52 (L) 09/21/2021 0830   GFRNONAA >60 03/12/2021 0832   GFRAA >60 01/24/2020 0924   GFRAA >60 07/19/2019 0951    No results found for: SPEP, UPEP  Lab Results  Component Value Date   WBC 7.4 09/21/2021   NEUTROABS 5.0 09/21/2021   HGB 12.8 09/21/2021   HCT 39.1 09/21/2021   MCV 92.0 09/21/2021   PLT 234 09/21/2021      Chemistry      Component Value Date/Time   NA 129 (L) 09/21/2021 0830   NA 139 04/14/2017 0742   K 4.9 09/21/2021 0830   K 4.0 04/14/2017 0742   CL 99 09/21/2021 0830   CO2 25 09/21/2021 0830   CO2 21 (L) 04/14/2017 0742   BUN 22 09/21/2021 0830   BUN 13.1 04/14/2017 0742   CREATININE 1.16 (H) 09/21/2021 0830   CREATININE 0.99 03/12/2021 0832   CREATININE 0.9 04/14/2017 0742      Component Value Date/Time   CALCIUM 9.1 09/21/2021 0830   CALCIUM 9.2 04/14/2017 0742   ALKPHOS 60 09/21/2021 0830   ALKPHOS 70 04/14/2017 0742   AST 13 (L) 09/21/2021 0830   AST 18 03/12/2021 0832   AST 17 04/14/2017 0742   ALT 6 09/21/2021 0830   ALT 9 03/12/2021 0832   ALT 14 04/14/2017 0742   BILITOT 0.6 09/21/2021 0830   BILITOT 0.8 03/12/2021 0832   BILITOT 0.37 04/14/2017 0742

## 2021-09-21 NOTE — Assessment & Plan Note (Signed)
She has chronic kidney disease stage III, stable We discussed importance of adequate hydration and aggressive blood pressure control 

## 2021-09-21 NOTE — Assessment & Plan Note (Signed)
Her blood pressure is better controlled with recent introduction of clonidine She will continue her current prescribed antihypertensive and will continue close monitoring of her blood pressure at home As above, I recommend reduced dose treatment

## 2021-09-21 NOTE — Assessment & Plan Note (Signed)
She is due for CT imaging She is not symptomatic We will proceed with maintenance treatment with bevacizumab but I recommend reduced dose due to recent uncontrolled hypertension She is in agreement

## 2021-09-26 ENCOUNTER — Ambulatory Visit (HOSPITAL_COMMUNITY)
Admission: RE | Admit: 2021-09-26 | Discharge: 2021-09-26 | Disposition: A | Discharge status: Home / Self Care | Payer: Medicare Other | Source: Ambulatory Visit | Attending: Hematology and Oncology | Admitting: Hematology and Oncology

## 2021-09-26 ENCOUNTER — Encounter (HOSPITAL_COMMUNITY): Payer: Self-pay

## 2021-09-26 DIAGNOSIS — C5701 Malignant neoplasm of right fallopian tube: Secondary | ICD-10-CM | POA: Insufficient documentation

## 2021-09-26 MED ORDER — SODIUM CHLORIDE (PF) 0.9 % IJ SOLN
INTRAMUSCULAR | Status: AC
  Filled 2021-09-26: qty 50

## 2021-09-26 MED ORDER — IOHEXOL 300 MG/ML  SOLN
100.0000 mL | Freq: Once | INTRAMUSCULAR | Status: AC | PRN
  Administered 2021-09-26: 100 mL via INTRAVENOUS

## 2021-09-27 ENCOUNTER — Other Ambulatory Visit: Payer: Self-pay | Admitting: Hematology and Oncology

## 2021-09-27 ENCOUNTER — Inpatient Hospital Stay (HOSPITAL_BASED_OUTPATIENT_CLINIC_OR_DEPARTMENT_OTHER): Payer: Medicare Other | Admitting: Hematology and Oncology

## 2021-09-27 ENCOUNTER — Encounter: Payer: Self-pay | Admitting: Hematology and Oncology

## 2021-09-27 DIAGNOSIS — Z5112 Encounter for antineoplastic immunotherapy: Secondary | ICD-10-CM | POA: Diagnosis not present

## 2021-09-27 DIAGNOSIS — J984 Other disorders of lung: Secondary | ICD-10-CM | POA: Diagnosis not present

## 2021-09-27 DIAGNOSIS — C5701 Malignant neoplasm of right fallopian tube: Secondary | ICD-10-CM

## 2021-09-27 DIAGNOSIS — I10 Essential (primary) hypertension: Secondary | ICD-10-CM | POA: Diagnosis not present

## 2021-09-27 NOTE — Assessment & Plan Note (Signed)
The cause is unknown She is not symptomatic We discussed the difficulties of performing biopsy with recent exposure to bevacizumab and the size of the lesions are too small for biopsy We discussed the risk and benefits of steroid therapy in case this is infusion reaction or referral to infectious disease to rule out infectious cause Ultimately, she is in agreement to be observed She will call me if she have signs and symptoms such as cough or shortness of breath

## 2021-09-27 NOTE — Assessment & Plan Note (Signed)
I have reviewed multiple imaging studies with the patient and her husband She developed cavitary lesion sometime between August of last year to early this year The cause is unknown There were no significant signs of exposure to exotic animals, recent foreign travels, new drugs or others Reaction to her bevacizumab will be unusual because She has been disease-free for the last 2 years on maintenance bevacizumab I recommend we discontinue bevacizumab and repeat imaging study in 8 weeks and she is in agreement

## 2021-09-27 NOTE — Progress Notes (Signed)
[place] OFFICE PROGRESS NOTE  Patient Care Team: Gaynelle Arabian, MD as PCP - General (Family Medicine) Heath Lark, MD as Consulting Physician (Hematology and Oncology) Everitt Amber, MD as Consulting Physician (Obstetrics and Gynecology)  ASSESSMENT & PLAN:  Adenocarcinoma of right fallopian tube [facility]) I have reviewed multiple imaging studies with the patient and her husband She developed cavitary lesion sometime between August of last year to early this year The cause is unknown There were no significant signs of exposure to exotic animals, recent foreign travels, new drugs or others Reaction to her bevacizumab will be unusual because She has been disease-free for the last 2 years on maintenance bevacizumab I recommend we discontinue bevacizumab and repeat imaging study in 8 weeks and she is in agreement  Essential hypertension Her blood pressure is very high today likely due to anxiety I am hopeful with discontinuation of bevacizumab, her blood pressure will eventually improve all the way back to normal If her systolic blood pressure is persistently improve over the next few weeks, she has my permission to discontinue hydrochlorothiazide I will see her next month for further follow-up  Cavitary lesion of lung The cause is unknown She is not symptomatic We discussed the difficulties of performing biopsy with recent exposure to bevacizumab and the size of the lesions are too small for biopsy We discussed the risk and benefits of steroid therapy in case this is infusion reaction or referral to infectious disease to rule out infectious cause Ultimately, she is in agreement to be observed She will call me if she have signs and symptoms such as cough or shortness of breath  No orders of the defined types were placed in this encounter.   All questions were answered. The patient knows to call the clinic with any problems, questions or concerns. The total time spent in  the appointment was 55 minutes encounter with patients including review of chart and various tests results, discussions about plan of care and coordination of care plan   Heath Lark, MD 09/27/2021 3:40 PM  INTERVAL HISTORY: Please see below for problem oriented charting. she returns for treatment follow-up and review of test results She denies recent significant cough, or chest pain or shortness of breath We reviewed multiple imaging studies together She have no signs to suggest recurrent ovarian or fallopian tube cancer Her documented blood pressure from home were within normal range except for today She denies foreign travels She usually goes to the beach Denies recent new medication No recent exposure to dust or fungi She was exposed to her son who was in prison for 2 weeks and he smoked pot and meth  REVIEW OF SYSTEMS:   Constitutional: Denies fevers, chills or abnormal weight loss Eyes: Denies blurriness of vision Ears, nose, mouth, throat, and face: Denies mucositis or sore throat Respiratory: Denies cough, dyspnea or wheezes Cardiovascular: Denies palpitation, chest discomfort or lower extremity swelling Gastrointestinal:  Denies nausea, heartburn or change in bowel habits Skin: Denies abnormal skin rashes Lymphatics: Denies new lymphadenopathy or easy bruising Neurological:Denies numbness, tingling or new weaknesses Behavioral/Psych: Mood is stable, no new changes  All other systems were reviewed with the patient and are negative.  I have reviewed the past medical history, past surgical history, social history and family history with the patient and they are unchanged from previous note.  ALLERGIES:  is allergic to dilaudid [hydromorphone hcl], morphine and related, and sudafed [pseudoephedrine hcl].  MEDICATIONS:  Current Outpatient Medications  Medication Sig Dispense Refill  acetaminophen (TYLENOL) 325 MG tablet Take 650 mg by mouth every 6 (six) hours as needed for  headache (pain).     atenolol (TENORMIN) 25 MG tablet TAKE 1 TABLET BY MOUTH EVERY DAY 30 tablet 2   cloNIDine (CATAPRES) 0.1 MG tablet Take 1 tablet (0.1 mg total) by mouth daily. 30 tablet 1   hydrochlorothiazide (HYDRODIURIL) 25 MG tablet Take 1 tablet (25 mg total) by mouth daily. 30 tablet 3   lidocaine-prilocaine (EMLA) cream Apply to affected area once daily as directed. (Patient taking differently: 1 application. as needed (access port). Apply to affected area once daily as directed.) 30 g 3   lisinopril (ZESTRIL) 20 MG tablet TAKE 1 TABLET(20 MG) BY MOUTH DAILY (Patient taking differently: Take 20 mg by mouth every evening.) 90 tablet 3   ondansetron (ZOFRAN) 8 MG tablet Take 1 tablet (8 mg total) by mouth every 8 (eight) hours as needed. (Patient not taking: Reported on 05/21/2021) 30 tablet 1   prochlorperazine (COMPAZINE) 10 MG tablet TAKE 1 TABLET BY MOUTH EVERY 6 HOURS AS NEEDED FOR NAUSEA AND/OR VOMITING (Patient not taking: Reported on 05/21/2021) 30 tablet 1   venlafaxine XR (EFFEXOR-XR) 37.5 MG 24 hr capsule TAKE 1 CAPSULE BY MOUTH DAILY WITH BREAKFAST 90 capsule 3   No current facility-administered medications for this visit.   Facility-Administered Medications Ordered in Other Visits  Medication Dose Route Frequency Provider Last Rate Last Admin   sodium chloride flush (NS) 0.9 % injection 10 mL  10 mL Intravenous PRN Alvy Bimler, Jahvon Gosline, MD   10 mL at 02/11/17 0839   sodium chloride flush (NS) 0.9 % injection 10 mL  10 mL Intravenous PRN Alvy Bimler, Dail Lerew, MD   10 mL at 03/04/17 1510    SUMMARY OF ONCOLOGIC HISTORY: Oncology History Overview Note  Neg genetics   Adenocarcinoma of right fallopian tube ([place])  08/12/2016 Initial Diagnosis   The patient has many months of vague symptoms of fullness in the pelvis, urinary frequency and difficulty with defecation. She denies vaginal bleeding or rectal bleeding.     08/12/2016 Imaging   Abdomen X-ray in the ER: Moderate colonic stool burden  without evidence of enteric obstruction   11/13/2016 Imaging   TVUS was performed on 11/13/16 which showed a large hypoechoic lobular mass seen posterior to the uterus measuring 18.2x16.1x11.8cm, blood flow was seen along the periphery of the mass. It was considered to be likely to be a fibroid vs pelvic mass vs ovarian mass. Neither ovary was removed. Moderate amount of free fluid in the pelvis. THe uterus measures 4x10x4cm. The endometrium is 56m.    12/05/2016 Imaging   MR pelvis 1. Mild limitations as detailed above. 2. Heterogeneous pelvic mass is favored to arise from the right ovary. Favor a solid ovarian neoplasm such as fibroma/Brenner's tumor. Given lesion size and heterogeneous T2 signal out, complicating torsion cannot be excluded. 3. Moderate abdominopelvic ascites, without specific evidence of peritoneal metastasis. Consider further evaluation with contrast-enhanced abdominopelvic CT.    12/26/2016 Pathology Results   1. Uterus, ovaries and fallopian tubes - ADENOCARCINOMA INVOLVING RIGHT FALLOPIAN TUBE, RIGHT AND LEFT OVARIES, UTERINE SEROSA AND PELVIC MASS. - SEE ONCOLOGY TABLE AND COMMENT. - UTERINE CERVIX, ENDOMETRIUM, MYOMETRIUM AND LEFT FALLOPIAN TUBE FREE OF TUMOR. 2. Omentum, resection for tumor - ADENOCARCINOMA. Microscopic Comment 1. ONCOLOGY TABLE - FALLOPIAN TUBE. SEE COMMENT 1. Specimen, including laterality: Uterus, bilateral adnexa, pelvic mass and omentum. 2. Procedure: Hysterectomy with bilateral salpingo-oophorectomy, pelvic mass excision and omentectomy. 3. Lymph node  sampling performed: No 4. Tumor site: See comment. 5. Tumor location in fallopian tube: Right fallopian tube fimbria 6. Specimen integrity (intact/ruptured/disrupted): Intact 7. Tumor size (cm): 18 cm, see comment. 8. Histologic type: Adenocarcinoma 9. Grade: High grade 10. Microscopic tumor extension: Tumor involves right fallopian tube, right and left ovaries, uterine serosa, pelvic mass and  omentum. 11. Margins: See comment. 12. Lymph-Vascular invasion: Present 13. Lymph nodes: # examined: 0; # positive: N/A 14. TNM: pT3c, pNX 15. FIGO Stage (based on pathologic findings, needs clinical correlation: III-C 16. Comments: There is an 18 cm pelvic mass which is a high grade adenocarcinoma and there is a 12.2 cm  segment of omentum which is extensively involved with adenocarcinoma. The tumor also involves the fimbria of the right fallopian tube and the parenchyma of the right and left ovaries as well as uterine serosa. The fimbria of the right fallopian has intraepithelial atypia consistent with a precursor lesion and therefore this is most consistent with primary fallopian tube adenocarcinoma. A primary peritoneal serous carcinoma is a less likely possibility.    12/26/2016 Pathology Results   PERITONEAL/ASCITIC FLUID(SPECIMEN 1 OF 1 COLLECTED 12/26/16): POORLY DIFFERENTIATED ADENOCARCINOMA   12/26/2016 Surgery   Procedure(s) Performed: Exploratory laparotomy with total abdominal hysterectomy, bilateral salpingo-oophorectomy, omentectomy radical tumor debulking for ovarian cancer .   Surgeon: Thereasa Solo, MD.      Operative Findings: 20cm left ovarian mass densely adherent to the posterior uterus, right tube and ovary, cervix, sigmoid colon. 10cm omental cake. 4L ascites. 53m size tumor nodules on the serosa of the terminal ileum and proximal sigmoid colon. 126mnodules on right diaphragm.    This represented an optimal cytoreduction (R1) with 59m25modules on intestine and diaphragm representing gross visible disease   01/16/2017 Procedure   Placement of single lumen port a cath via right internal jugular vein. The catheter tip lies at the cavo-atrial junction. A power injectable port a cath was placed and is ready for immediate use.   01/17/2017 Imaging   1. 3.5 x 7.2 x 4.6 cm fluid collection along the vaginal cuff, posterior the bladder and extending into the right adnexal space. This  lesion demonstrates rim enhancement. Imaging features could be related to a loculated postoperative seroma or hematoma. Superinfection cannot be excluded by CT. Given debulking surgery was 3 weeks ago, this entire structure is un likely to represent neoplasm, but peritoneal involvement could have this appearance. 2. Small fluid collection in the left para colic gutter without rim enhancement. 3. Irregular/nodular appearance of the peritoneal M in the anatomic pelvis with areas of subtle nodularity between the stomach and the spleen. Close attention in these regions on follow-up recommended as metastatic disease is a concern. 4. Mildly enlarged hepatoduodenal ligament lymph node. Attention on follow-up recommended.   01/20/2017 Tumor Marker   Patient's tumor was tested for the following markers: CA-125 Results of the tumor marker test revealed 46.6   01/28/2017 Genetic Testing       Negative genetic testing on the [facility].  The [facility] panel offered by [employer] [employer] sequencing and deletion/duplication testing of the following 28 genes: APC, ATM, BARD1, BMPR1A, BRCA1, BRCA2, BRIP1, CHD1, CDK4, CDKN2A, CHEK2, EPCAM (large rearrangement only), MLH1, MSH2, MSH6, MUTYH, NBN, PALB2, PMS2, PTEN, RAD51C, RAD51D, SMAD4, STK11, and TP53. Sequencing was performed for select regions of POLE and POLD1, and large rearrangement analysis was performed for select regions of GREM1. The report date is January 28, 2017.  HRD testing looking  for genomic instability and BRCA mutations was negative.  The report date of this test is January 27, 2017.    01/31/2017 Tumor Marker   Patient's tumor was tested for the following markers: CA-125 Results of the tumor marker test revealed 22.4   03/04/2017 Tumor Marker   Patient's tumor was tested for the following markers: CA-125 Results of the tumor marker test revealed 13.8   04/18/2017 Tumor Marker   Patient's tumor was tested for the  following markers: CA-125 Results of the tumor marker test revealed 11.9   05/05/2017 Tumor Marker   Patient's tumor was tested for the following markers: CA-125 Results of the tumor marker test revealed 12   05/26/2017 Tumor Marker   Patient's tumor was tested for the following markers: CA-125 Results of the tumor marker test revealed 10.6   05/26/2017 Imaging   1. A previously noted postoperative fluid collection in the low pelvis and residual ascites seen on the prior examination has resolved on today's study. No findings to suggest residual/recurrent disease on today's examination. No definite solid organ metastasis identified in the abdomen or pelvis. No lymphadenopathy. 2. Aortic atherosclerosis.   06/10/2017 Procedure   Successful right IJ vein Port-A-Cath explant.   03/09/2018 Tumor Marker   Patient's tumor was tested for the following markers: CA-125 Results of the tumor marker test revealed 12.1   05/26/2018 Tumor Marker   Patient's tumor was tested for the following markers: CA-125 Results of the tumor marker test revealed 13.8   09/09/2018 Tumor Marker   Patient's tumor was tested for the following markers: CA-125 Results of the tumor marker test revealed 23.9   12/18/2018 Tumor Marker   Patient's tumor was tested for the following markers: CA-125 Results of the tumor marker test revealed 43.8   02/27/2019 - 02/28/2019 Hospital Admission   She was admitted for bowel obstruction   02/27/2019 Imaging   1. High-grade small bowel obstruction with transition point in the pelvis in an area of suspected desmoplastic reaction surrounding a 1.3 cm spiculated nodule in the mesentery, suspicious for carcinoid. There is hyperenhancement near the tip of the appendix and loss of the normal fat plane between it and the adjacent sigmoid colon, potentially the site of primary lesion. 2. Multiple new small subcentimeter hyperdense lesions scattered along the liver capsule, concerning for  metastases. Enlarged hyperenhancing gastrohepatic and portacaval lymph nodes concerning for nodal metastases. 3. Very mild right hydroureteronephrosis to the level of the desmoplastic reaction in the pelvis, potentially involving the right ureter. 4. Infraumbilical ventral abdominal wall diastasis containing a small nondilated portion of transverse colon. 5. Trace ascites. 6. Cholelithiasis. 7.  Aortic atherosclerosis (ICD10-I70.0).     03/05/2019 Tumor Marker   Patient's tumor was tested for the following markers: CA-125 Results of the tumor marker test revealed 70.1   03/12/2019 Echocardiogram   IMPRESSIONS     1. Left ventricular ejection fraction, by visual estimation, is 60 to 65%. The left ventricle has normal function. There is no left ventricular hypertrophy.  2. Left ventricular diastolic parameters are consistent with Grade I diastolic dysfunction (impaired relaxation).  3. Global right ventricle has normal systolic function.The right ventricular size is normal. No increase in right ventricular wall thickness.  4. Left atrial size was normal.  5. Right atrial size was normal.  6. The pericardium was not assessed.  7. The mitral valve is normal in structure. No evidence of mitral valve regurgitation.  8. The tricuspid valve is normal in structure. Tricuspid valve regurgitation  is trivial.  9. The aortic valve is normal in structure. Aortic valve regurgitation is not visualized. 10. The pulmonic valve was grossly normal. Pulmonic valve regurgitation is not visualized. 11. The average left ventricular global longitudinal strain is -17.6 %.   03/16/2019 Procedure   Successful placement of a right internal jugular approach power injectable Port-A-Cath. The catheter is ready for immediate use.     04/19/2019 Tumor Marker   Patient's tumor was tested for the following markers: CA-125 Results of the tumor marker test revealed 39.8   05/17/2019 Tumor Marker   Patient's tumor was  tested for the following markers: CA-125 Results of the tumor marker test revealed 27   05/28/2019 Tumor Marker   Patient's tumor was tested for the following markers: CA-125 Results of the tumor marker test revealed 27.7.   06/11/2019 Imaging   1. No new or progressive metastatic disease in the abdomen or pelvis. 2. Tiny noncalcified perisplenic implant is decreased. Calcified pericardiophrenic, retroperitoneal and bilateral inguinal lymph nodes and scattered small calcified perihepatic and perisplenic implants are stable. 3.  Aortic Atherosclerosis (ICD10-I70.0).       06/17/2019 Echocardiogram    1. Left ventricular ejection fraction, by estimation, is 65 to 70%. The left ventricle has normal function. The left ventricle has no regional wall motion abnormalities. Left ventricular diastolic parameters were normal.  2. Right ventricular systolic function is normal. The right ventricular size is normal.  3. The mitral valve is normal in structure and function. No evidence of mitral valve regurgitation. No evidence of mitral stenosis.  4. The aortic valve is normal in structure and function. Aortic valve regurgitation is not visualized. No aortic stenosis is present.   06/21/2019 Tumor Marker   Patient's tumor was tested for the following markers: CA-125 Results of the tumor marker test revealed 18.7   07/19/2019 Tumor Marker   Patient's tumor was tested for the following markers: CA-125 Results of the tumor marker test revealed 16.7   08/30/2019 Tumor Marker   Patient's tumor was tested for the following markers: CA-125. Results of the tumor marker test revealed 13.9   09/15/2019 Echocardiogram    1. Left ventricular ejection fraction, by estimation, is 65 to 70%. The left ventricle has normal function. The left ventricle has no regional wall motion abnormalities. Left ventricular diastolic parameters are consistent with Grade I diastolic dysfunction (impaired relaxation). The average left  ventricular global longitudinal strain is 18.1 %. The global longitudinal strain is normal.  2. Right ventricular systolic function is normal. The right ventricular size is normal.  3. The mitral valve is normal in structure. No evidence of mitral valve regurgitation. No evidence of mitral stenosis.  4. The aortic valve is normal in structure. Aortic valve regurgitation is not visualized. No aortic stenosis is present.  5. The inferior vena cava is normal in size with greater than 50% respiratory variability, suggesting right atrial pressure of 3 mmHg.     09/24/2019 Imaging   1. Stable exam. No new or progressive metastatic disease on today's study. 2. Small subcapsular hypodensity in the lateral spleen, new in the interval, but indeterminate. Attention on follow-up recommended. 3. The calcified upper abdominal and groin lymph nodes are stable as are the scattered calcified and noncalcified peritoneal implants. 4. Stable midline ventral hernia containing a short segment of colon without complicating features. 5. Aortic Atherosclerosis (ICD10-I70.0).     10/04/2019 - 09/21/2021 Chemotherapy   Patient is on Treatment Plan : ovarian Bevacizumab q21d  10/04/2019 Tumor Marker   Patient's tumor was tested for the following markers: CA-125. Results of the tumor marker test revealed 12.9.   11/01/2019 Tumor Marker   Patient's tumor was tested for the following markers: CA-125 Results of the tumor marker test revealed 15.4   12/13/2019 Tumor Marker   Patient's tumor was tested for the following markers: CA-125 Results of the tumor marker test revealed 14.8   12/31/2019 Imaging   1. Unchanged tiny peritoneal nodule adjacent to the spleen measuring 6 mm. Other previously noted peritoneal nodules are imperceptible on present examination. 2. Unchanged prominent, partially calcified portacaval lymph node and bilateral inguinal lymph nodes. 3. Unchanged subtle, nonspecific subcapsular lesion of the  spleen.  4. No evidence of new metastatic disease in the abdomen or pelvis. 5. Status post hysterectomy. 6. Unchanged low midline ventral abdominal hernia containing a single loop of nonobstructed transverse colon. 7. Cholelithiasis. 8. Aortic Atherosclerosis (ICD10-I70.0).       01/03/2020 Tumor Marker   Patient's tumor was tested for the following markers: CA-125 Results of the tumor marker test revealed 11.7.   01/24/2020 Tumor Marker   Patient's tumor was tested for the following markers: CA-125. Results of the tumor marker test revealed 15.1   03/06/2020 Tumor Marker   Patient's tumor was tested for the following markers: CA-125. Results of the tumor marker test revealed 20.3   03/27/2020 Tumor Marker   Patient's tumor was tested for the following markers: CA-125 Results of the tumor marker test revealed 23.7   05/11/2020 Tumor Marker   Patient's tumor was tested for the following markers: CA-125. Results of the tumor marker test revealed 25.4   06/23/2020 Imaging   1. Cholelithiasis with gallbladder wall thickening and mild infiltrative edema in the porta hepatis, raising the possibility of acute cholecystitis. There is truncation of the common bile duct distally and distal choledocholithiasis is difficult to exclude. Correlate with bilirubin levels. If clinically warranted, MRCP could be utilized for further characterization. 2. Stable tiny perisplenic nodule. 3. Other imaging findings of potential clinical significance: Small type 1 hiatal hernia. Prominent stool throughout the colon favors constipation. Ventral infraumbilical hernia contains transverse colon without findings of strangulation or obstruction. Small supraumbilical hernia contains adipose tissue. Lumbar degenerative disc disease at L5-S1. Stable tiny nodule along the lateral margin of the spleen, nonspecific. 4. Aortic atherosclerosis.     12/15/2020 Imaging   No evidence of recurrent or metastatic carcinoma within  the abdomen or pelvis.   Stable ventral hernia containing transverse colon. No evidence of bowel obstruction or strangulation.   Cholelithiasis. No radiographic evidence of cholecystitis.   Tiny hiatal hernia.   Aortic Atherosclerosis (ICD10-I70.0).     06/11/2021 Imaging   1. Numerous sub 4 mm pulmonary nodules in the lungs some of which have a cavitary appearance. I think it is unlikely this is metastatic disease and more likely a possible immunotherapy related process such as a sarcoid like reaction. Recommend full chest CT further evaluation. 2. Stable small calcified retroperitoneal and mesenteric lymph nodes. No findings suspicious for recurrent peritoneal carcinomatosis. 3. Stable lower abdominal wall hernia containing part of the transverse colon. 4. Cholelithiasis.   09/27/2021 Imaging   1. Numerous small pulmonary nodules throughout the lungs, the majority of which are cavitary. These are all subcentimeter, however nodules that were seen in the bilateral lung bases on prior CT dated 06/08/2021 appear slightly increased in size and number. Behavior is highly suspicious for pulmonary metastatic disease despite the unusual appearance, however as  previously reported general differential considerations include atypical infection and inflammation, such as drug-induced sarcoidosis like reaction.  2. Unchanged calcified lymph nodes in the abdomen and pelvis, consistent with treated nodal metastases. 3. Cholelithiasis with wall thickening and pericholecystic fluid. This appearance is similar to prior examination and suggests chronic cholecystitis. Correlate for clinical symptoms. Mild intrahepatic biliary ductal dilatation, similar to prior examination. 4. Midline ventral hernia containing a single nonobstructed loop of mid transverse colon.       PHYSICAL EXAMINATION: ECOG PERFORMANCE STATUS: 0 - Asymptomatic  Vitals:   09/27/21 1437  BP: (!) 200/83  Pulse: (!) 57  Resp: 18  Temp:  97.9 F (36.6 C)  SpO2: 98%   Filed Weights   09/27/21 1437  Weight: 152 lb 12.8 oz (69.3 kg)    GENERAL:alert, no distress and comfortable NEURO: alert & oriented x 3 with fluent speech, no focal motor/sensory deficits  LABORATORY DATA:  I have reviewed the data as listed    Component Value Date/Time   NA 129 (L) 09/21/2021 0830   NA 139 04/14/2017 0742   K 4.9 09/21/2021 0830   K 4.0 04/14/2017 0742   CL 99 09/21/2021 0830   CO2 25 09/21/2021 0830   CO2 21 (L) 04/14/2017 0742   GLUCOSE 100 (H) 09/21/2021 0830   GLUCOSE 247 (H) 04/14/2017 0742   BUN 22 09/21/2021 0830   BUN 13.1 04/14/2017 0742   CREATININE 1.16 (H) 09/21/2021 0830   CREATININE 0.99 03/12/2021 0832   CREATININE 0.9 04/14/2017 0742   CALCIUM 9.1 09/21/2021 0830   CALCIUM 9.2 04/14/2017 0742   PROT 6.1 (L) 09/21/2021 0830   PROT 7.3 04/14/2017 0742   ALBUMIN 3.5 09/21/2021 0830   ALBUMIN 3.9 04/14/2017 0742   AST 13 (L) 09/21/2021 0830   AST 18 03/12/2021 0832   AST 17 04/14/2017 0742   ALT 6 09/21/2021 0830   ALT 9 03/12/2021 0832   ALT 14 04/14/2017 0742   ALKPHOS 60 09/21/2021 0830   ALKPHOS 70 04/14/2017 0742   BILITOT 0.6 09/21/2021 0830   BILITOT 0.8 03/12/2021 0832   BILITOT 0.37 04/14/2017 0742   GFRNONAA 52 (L) 09/21/2021 0830   GFRNONAA >60 03/12/2021 0832   GFRAA >60 01/24/2020 0924   GFRAA >60 07/19/2019 0951    No results found for: "SPEP", "UPEP"  Lab Results  Component Value Date   WBC 7.4 09/21/2021   NEUTROABS 5.0 09/21/2021   HGB 12.8 09/21/2021   HCT 39.1 09/21/2021   MCV 92.0 09/21/2021   PLT 234 09/21/2021      Chemistry      Component Value Date/Time   NA 129 (L) 09/21/2021 0830   NA 139 04/14/2017 0742   K 4.9 09/21/2021 0830   K 4.0 04/14/2017 0742   CL 99 09/21/2021 0830   CO2 25 09/21/2021 0830   CO2 21 (L) 04/14/2017 0742   BUN 22 09/21/2021 0830   BUN 13.1 04/14/2017 0742   CREATININE 1.16 (H) 09/21/2021 0830   CREATININE 0.99 03/12/2021 0832    CREATININE 0.9 04/14/2017 0742      Component Value Date/Time   CALCIUM 9.1 09/21/2021 0830   CALCIUM 9.2 04/14/2017 0742   ALKPHOS 60 09/21/2021 0830   ALKPHOS 70 04/14/2017 0742   AST 13 (L) 09/21/2021 0830   AST 18 03/12/2021 0832   AST 17 04/14/2017 0742   ALT 6 09/21/2021 0830   ALT 9 03/12/2021 0832   ALT 14 04/14/2017 0742   BILITOT 0.6 09/21/2021  0830   BILITOT 0.8 03/12/2021 0832   BILITOT 0.37 04/14/2017 0742       RADIOGRAPHIC STUDIES: I have reviewed multiple imaging studies with the patient and her husband I have personally reviewed the radiological images as listed and agreed with the findings in the report. CT CHEST ABDOMEN PELVIS W CONTRAST  Result Date: 09/26/2021 CLINICAL DATA:  Recurrent ovarian cancer, assess treatment response, ongoing chemotherapy * Tracking Code: BO * EXAM: CT CHEST, ABDOMEN, AND PELVIS WITH CONTRAST TECHNIQUE: Multidetector CT imaging of the chest, abdomen and pelvis was performed following the standard protocol during bolus administration of intravenous contrast. RADIATION DOSE REDUCTION: This exam was performed according to the departmental dose-optimization program which includes automated exposure control, adjustment of the mA and/or kV according to patient size and/or use of iterative reconstruction technique. CONTRAST:  144m OMNIPAQUE IOHEXOL 300 MG/ML SOLN, additional oral enteric contrast COMPARISON:  CT abdomen pelvis, 06/08/2021, CT chest abdomen pelvis, 01/17/2017 FINDINGS: CT CHEST FINDINGS Cardiovascular: Right chest port catheter. Scattered aortic atherosclerosis. Normal heart size. No pericardial effusion. Mediastinum/Nodes: No enlarged mediastinal, hilar, or axillary lymph nodes. Small hiatal hernia. Thyroid gland, trachea, and esophagus demonstrate no significant findings. Lungs/Pleura: There are numerous (greater than 100) small pulmonary nodules throughout the lungs, the majority of which are cavitary. These are all subcentimeter,  generally measuring 0.3-0.6 cm. The nodules that were seen in the bilateral lung bases on prior CT dated 06/08/2021 appear slightly increased in size and number, for example a nodule of the paramedian right lower lobe measuring 0.5 cm, previously 0.3 cm (series 4, image 112). No pleural effusion or pneumothorax. Musculoskeletal: No chest wall mass or suspicious osseous lesions identified. CT ABDOMEN PELVIS FINDINGS Hepatobiliary: No solid liver abnormality is seen. Rim calcified gallstone dependently in the gallbladder near the gallbladder neck. Wall thickening and pericholecystic fluid (series 2, image 60). Mild intrahepatic biliary ductal dilatation, similar to prior examination. Pancreas: Unremarkable. No pancreatic ductal dilatation or surrounding inflammatory changes. Spleen: Normal in size without significant abnormality. Adrenals/Urinary Tract: Adrenal glands are unremarkable. Kidneys are normal, without renal calculi, solid lesion, or hydronephrosis. Bladder is unremarkable. Stomach/Bowel: Stomach is within normal limits. Appendix appears normal. No evidence of bowel wall thickening, distention, or inflammatory changes. Large burden of stool throughout the colon. Status post omentectomy. Vascular/Lymphatic: Aortic atherosclerosis. Unchanged small calcified lymph nodes in the upper abdomen, for example in the gastrohepatic ligament series 2, image 59), as well as in the bilateral inguinal stations (series 2, image 111). Reproductive: Status post hysterectomy. Other: Midline ventral hernia containing a single nonobstructed loop of mid transverse colon (series 2, image 91). No ascites. Musculoskeletal: No acute osseous findings. IMPRESSION: 1. Numerous small pulmonary nodules throughout the lungs, the majority of which are cavitary. These are all subcentimeter, however nodules that were seen in the bilateral lung bases on prior CT dated 06/08/2021 appear slightly increased in size and number. Behavior is highly  suspicious for pulmonary metastatic disease despite the unusual appearance, however as previously reported general differential considerations include atypical infection and inflammation, such as drug-induced sarcoidosis like reaction. 2. Unchanged calcified lymph nodes in the abdomen and pelvis, consistent with treated nodal metastases. 3. Cholelithiasis with wall thickening and pericholecystic fluid. This appearance is similar to prior examination and suggests chronic cholecystitis. Correlate for clinical symptoms. Mild intrahepatic biliary ductal dilatation, similar to prior examination. 4. Midline ventral hernia containing a single nonobstructed loop of mid transverse colon. Aortic Atherosclerosis (ICD10-I70.0). Electronically Signed   By: AJamse MeadD.  On: 09/26/2021 20:51

## 2021-09-27 NOTE — Assessment & Plan Note (Signed)
Her blood pressure is very high today likely due to anxiety I am hopeful with discontinuation of bevacizumab, her blood pressure will eventually improve all the way back to normal If her systolic blood pressure is persistently improve over the next few weeks, she has my permission to discontinue hydrochlorothiazide I will see her next month for further follow-up

## 2021-10-12 ENCOUNTER — Inpatient Hospital Stay: Payer: Medicare Other

## 2021-10-12 ENCOUNTER — Inpatient Hospital Stay: Payer: Medicare Other | Admitting: Hematology and Oncology

## 2021-10-20 IMAGING — CT CT ABD-PELV W/ CM
2 of 5 series · 14 of 46 positions shown, 16 images · IV contrast (OMNIPAQUE)
Comparison: 06/11/2019

CLINICAL DATA: Ovarian cancer.  Restaging.

EXAM:
CT ABDOMEN AND PELVIS WITH CONTRAST
TECHNIQUE: Multidetector CT imaging of the abdomen and pelvis was performed
using the standard protocol following bolus administration of
intravenous contrast.
CONTRAST:  100mL OMNIPAQUE IOHEXOL 300 MG/ML  SOLN

[Series 2: axial st · axial · 0.74mm/px · z∈[+1002,+1422]mm · 11 of 98 slices shown, 13 images]
[im 7/98  soft-tissue]
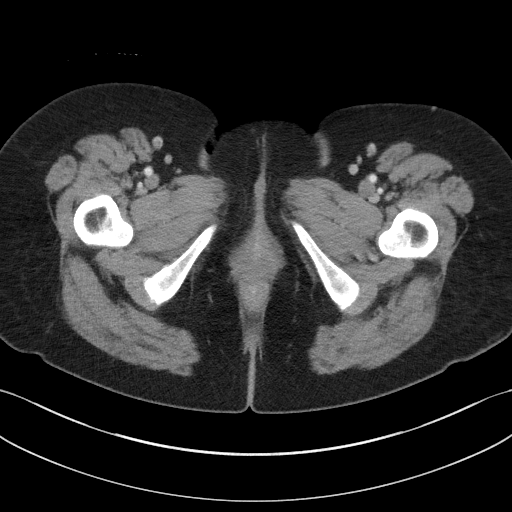
[im 7/98  bone]
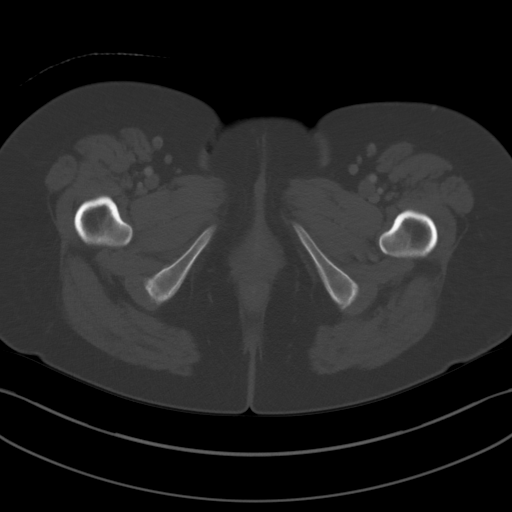
[im 14/98  soft-tissue]
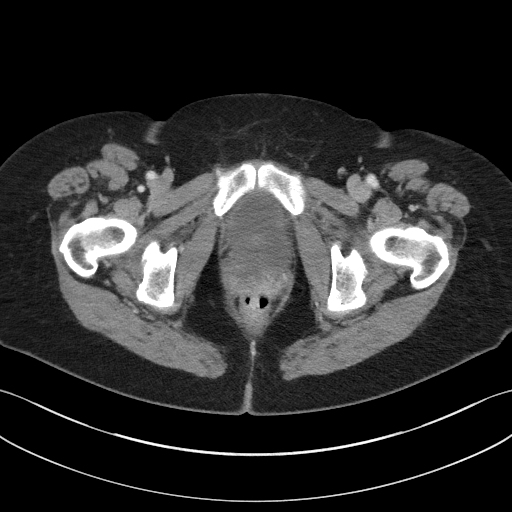
[im 21/98  soft-tissue]
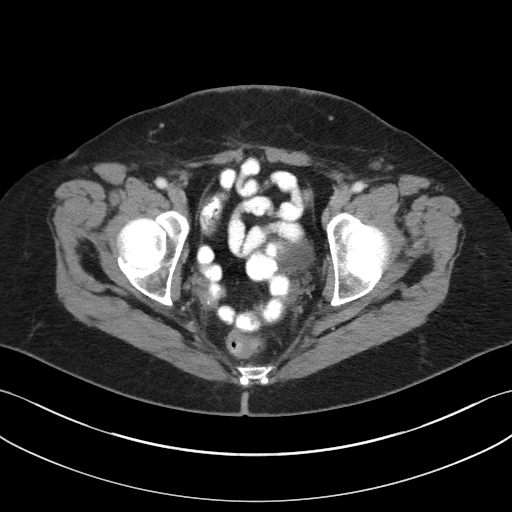
[im 35/98  soft-tissue]
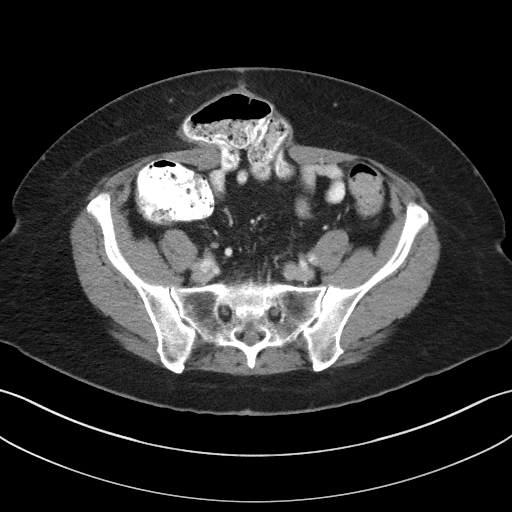
[im 42/98  soft-tissue]
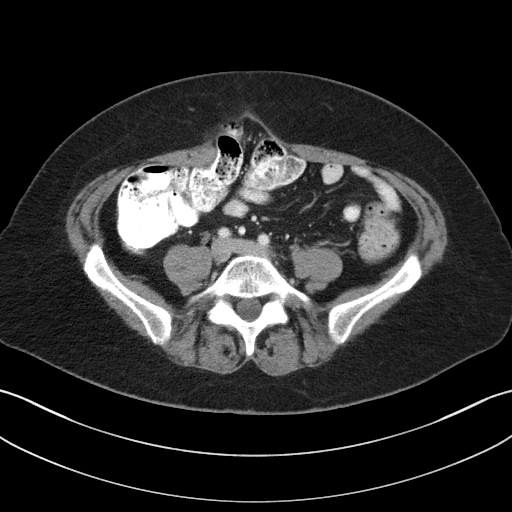
[im 49/98  soft-tissue]
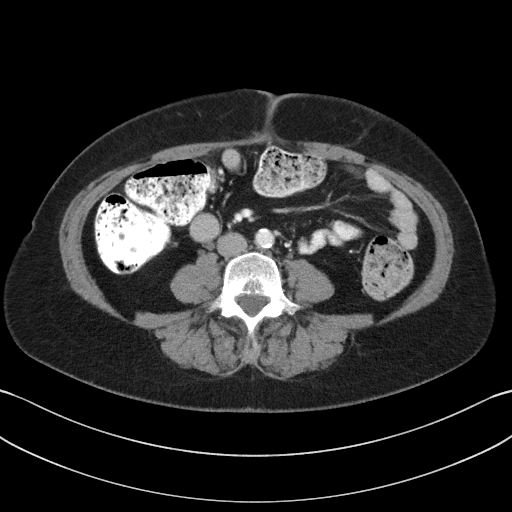
[im 56/98  soft-tissue]
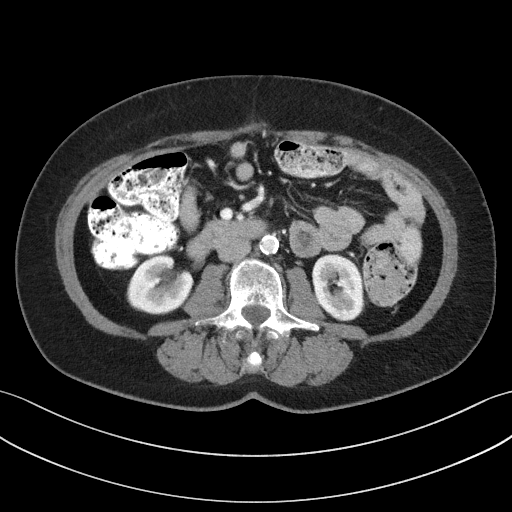
[im 63/98  soft-tissue]
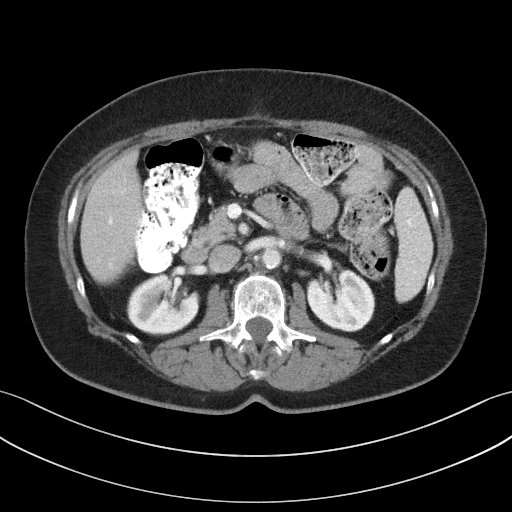
[im 77/98  soft-tissue]
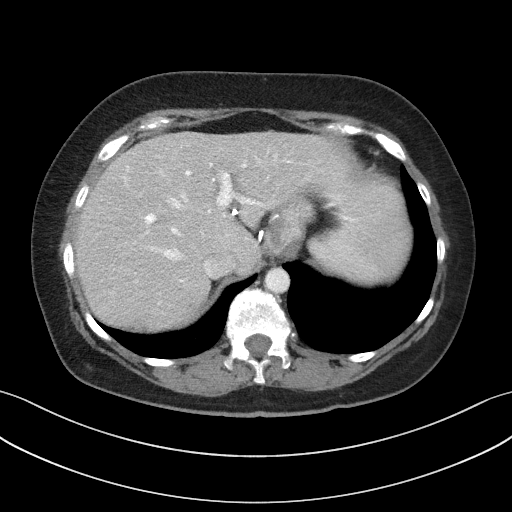
[im 77/98  bone]
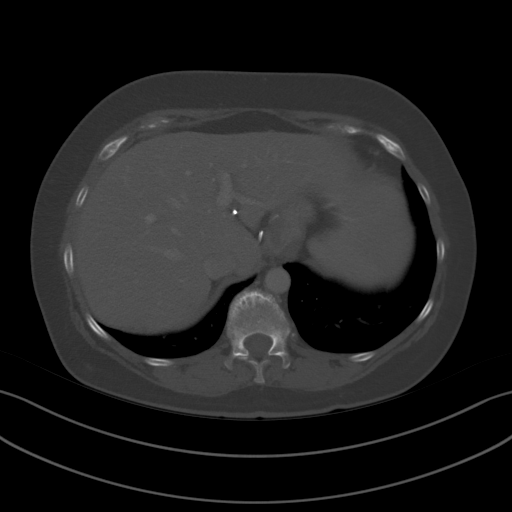
[im 84/98  soft-tissue]
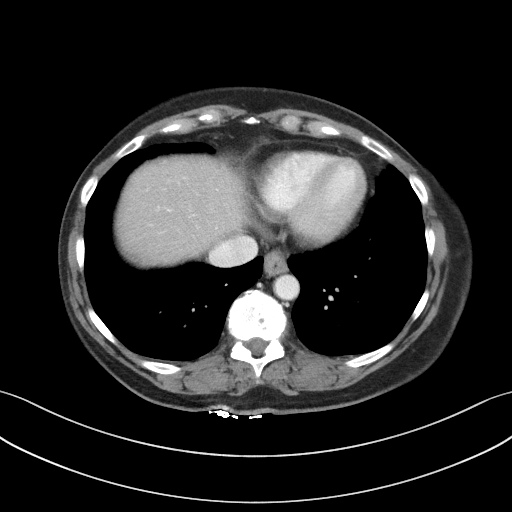
[im 91/98  soft-tissue]
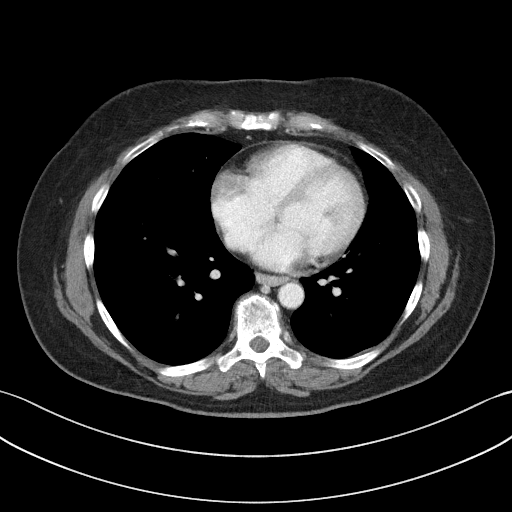

[Series 5: coronal st · coronal · 0.68mm/px · 3 of 101 slices shown]
[im 34/101  soft-tissue]
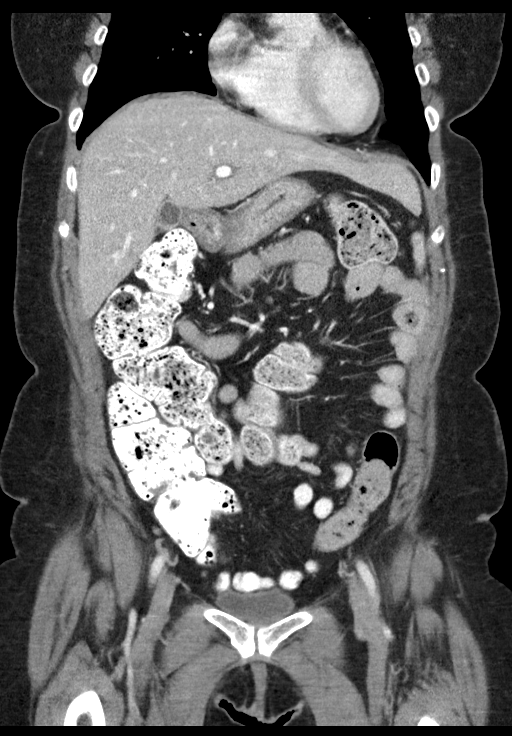
[im 45/101  soft-tissue]
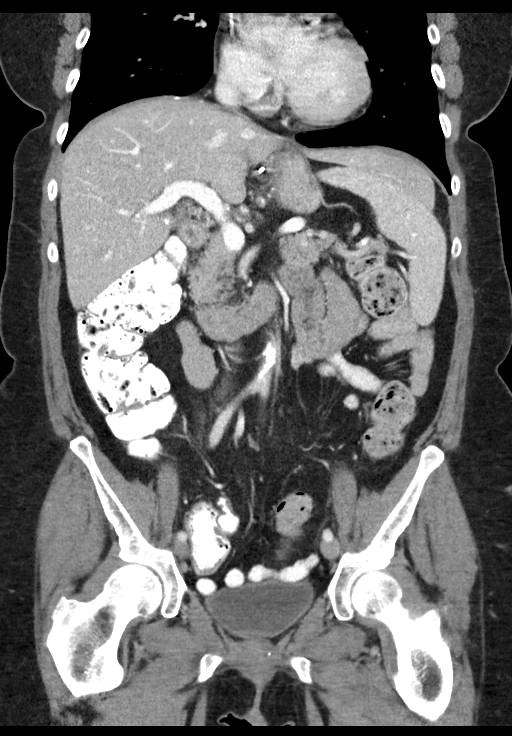
[im 56/101  soft-tissue]
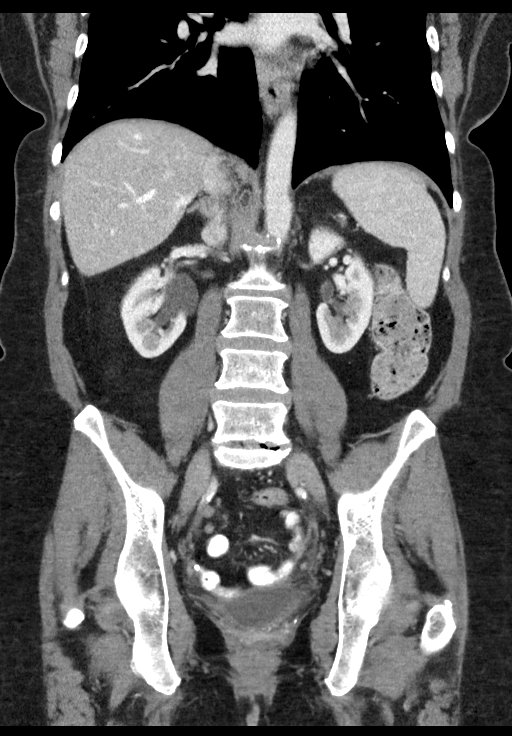

[14 of 46 positions shown; findings below may reference images not displayed]

FINDINGS: Lower chest: Unremarkable.

Hepatobiliary: No focal abnormality within the liver parenchyma.
Capsular implant on the posterior hepatic dome is stable at 9 mm
([DATE]) calcified implant along the falciform ligament is unchanged.
There is no evidence for gallstones, gallbladder wall thickening, or
pericholecystic fluid. No intrahepatic or extrahepatic biliary
dilation.

Pancreas: No focal mass lesion. No dilatation of the main duct. No
intraparenchymal cyst. No peripancreatic edema.

Spleen: Small subcapsular hypodensity in the lateral spleen measures
6 mm, new in the interval, but indeterminate. Tiny perisplenic
nodular peritoneal and capsular implants are stable including
previously measured index 5 mm nodule on [DATE].

Adrenals/Urinary Tract: No adrenal nodule or mass. Kidneys
unremarkable. No evidence for hydroureter. The urinary bladder
appears normal for the degree of distention.

Stomach/Bowel: Postsurgical change in the gastric cardia consistent
with prior fundoplication. Stomach otherwise unremarkable. Duodenum
is normally positioned as is the ligament of Treitz. No small bowel
wall thickening. No small bowel dilatation. The terminal ileum is
normal. The appendix is normal. Prominent stool volume right and
transverse colon.

Vascular/Lymphatic: There is abdominal aortic atherosclerosis
without aneurysm. Calcific nodal tissue in the gastrohepatic and
hepatoduodenal ligaments is stable. Index portacaval node measured
previously at 10 mm short axis is stable ([DATE]) the small calcified
para-aortic lymph nodes are unchanged. Stable appearance of the
borderline enlarged bilateral groin nodes. Right index left inguinal
node measured previously at 11 mm short axis is unchanged on image
80/2 today.

Reproductive: Uterus surgically absent.  There is no adnexal mass.

Other: No intraperitoneal free fluid. Peritoneal thickening in the
cul-de-sac is similar.

Musculoskeletal: Infraumbilical midline anterior abdominal wall
hernia contains a segment of transverse colon without complicating
features and similar to prior. No worrisome lytic or sclerotic
osseous abnormality.
IMPRESSION: 1. Stable exam. No new or progressive metastatic disease on today's
study.
2. Small subcapsular hypodensity in the lateral spleen, new in the
interval, but indeterminate. Attention on follow-up recommended.
3. The calcified upper abdominal and groin lymph nodes are stable as
are the scattered calcified and noncalcified peritoneal implants.
4. Stable midline ventral hernia containing a short segment of colon
without complicating features.
5. Aortic Atherosclerosis ([id number]).

## 2021-10-23 ENCOUNTER — Other Ambulatory Visit: Payer: Self-pay | Admitting: Hematology and Oncology

## 2021-10-24 ENCOUNTER — Encounter: Payer: Self-pay | Admitting: Hematology and Oncology

## 2021-10-25 ENCOUNTER — Encounter: Payer: Self-pay | Admitting: Hematology and Oncology

## 2021-11-06 ENCOUNTER — Inpatient Hospital Stay: Payer: Medicare Other

## 2021-11-06 ENCOUNTER — Telehealth: Payer: Self-pay

## 2021-11-06 ENCOUNTER — Inpatient Hospital Stay: Payer: Medicare Other | Attending: Hematology and Oncology | Admitting: Hematology and Oncology

## 2021-11-06 ENCOUNTER — Encounter: Payer: Self-pay | Admitting: Hematology and Oncology

## 2021-11-06 ENCOUNTER — Other Ambulatory Visit: Payer: Self-pay

## 2021-11-06 VITALS — BP 188/72 | HR 85 | Temp 97.6°F | Resp 18 | Ht 65.0 in | Wt 150.0 lb

## 2021-11-06 DIAGNOSIS — I10 Essential (primary) hypertension: Secondary | ICD-10-CM | POA: Diagnosis not present

## 2021-11-06 DIAGNOSIS — C5701 Malignant neoplasm of right fallopian tube: Secondary | ICD-10-CM

## 2021-11-06 DIAGNOSIS — L559 Sunburn, unspecified: Secondary | ICD-10-CM | POA: Diagnosis not present

## 2021-11-06 DIAGNOSIS — N183 Chronic kidney disease, stage 3 unspecified: Secondary | ICD-10-CM | POA: Diagnosis not present

## 2021-11-06 DIAGNOSIS — Z7189 Other specified counseling: Secondary | ICD-10-CM

## 2021-11-06 DIAGNOSIS — Z79899 Other long term (current) drug therapy: Secondary | ICD-10-CM | POA: Diagnosis not present

## 2021-11-06 LAB — CBC WITH DIFFERENTIAL/PLATELET
Abs Immature Granulocytes: 0.02 10*3/uL (ref 0.00–0.07)
Basophils Absolute: 0.1 10*3/uL (ref 0.0–0.1)
Basophils Relative: 1 %
Eosinophils Absolute: 0.7 10*3/uL — ABNORMAL HIGH (ref 0.0–0.5)
Eosinophils Relative: 11 %
HCT: 38.5 % (ref 36.0–46.0)
Hemoglobin: 13 g/dL (ref 12.0–15.0)
Immature Granulocytes: 0 %
Lymphocytes Relative: 14 %
Lymphs Abs: 1 10*3/uL (ref 0.7–4.0)
MCH: 30.8 pg (ref 26.0–34.0)
MCHC: 33.8 g/dL (ref 30.0–36.0)
MCV: 91.2 fL (ref 80.0–100.0)
Monocytes Absolute: 0.5 10*3/uL (ref 0.1–1.0)
Monocytes Relative: 7 %
Neutro Abs: 4.5 10*3/uL (ref 1.7–7.7)
Neutrophils Relative %: 67 %
Platelets: 181 10*3/uL (ref 150–400)
RBC: 4.22 MIL/uL (ref 3.87–5.11)
RDW: 14.9 % (ref 11.5–15.5)
WBC: 6.8 10*3/uL (ref 4.0–10.5)
nRBC: 0 % (ref 0.0–0.2)

## 2021-11-06 LAB — COMPREHENSIVE METABOLIC PANEL
ALT: 6 U/L (ref 0–44)
AST: 14 U/L — ABNORMAL LOW (ref 15–41)
Albumin: 3.6 g/dL (ref 3.5–5.0)
Alkaline Phosphatase: 52 U/L (ref 38–126)
Anion gap: 5 (ref 5–15)
BUN: 29 mg/dL — ABNORMAL HIGH (ref 8–23)
CO2: 26 mmol/L (ref 22–32)
Calcium: 9.1 mg/dL (ref 8.9–10.3)
Chloride: 100 mmol/L (ref 98–111)
Creatinine, Ser: 1.27 mg/dL — ABNORMAL HIGH (ref 0.44–1.00)
GFR, Estimated: 46 mL/min — ABNORMAL LOW (ref >60)
Glucose, Bld: 143 mg/dL — ABNORMAL HIGH (ref 70–99)
Potassium: 4.5 mmol/L (ref 3.5–5.1)
Sodium: 131 mmol/L — ABNORMAL LOW (ref 135–145)
Total Bilirubin: 0.5 mg/dL (ref 0.3–1.2)
Total Protein: 6.1 g/dL — ABNORMAL LOW (ref 6.5–8.1)

## 2021-11-06 LAB — TOTAL PROTEIN, URINE DIPSTICK: Protein, ur: 100 mg/dL — AB

## 2021-11-06 MED ORDER — SODIUM CHLORIDE 0.9% FLUSH
10.0000 mL | Freq: Once | INTRAVENOUS | Status: AC
  Administered 2021-11-06: 10 mL

## 2021-11-06 MED ORDER — DOXAZOSIN MESYLATE 4 MG PO TABS: 4.0000 mg | ORAL_TABLET | Freq: Every day | ORAL | 1 refills | Status: DC

## 2021-11-06 MED ORDER — HEPARIN SOD (PORK) LOCK FLUSH 100 UNIT/ML IV SOLN
500.0000 [IU] | Freq: Once | INTRAVENOUS | Status: AC
  Administered 2021-11-06: 500 [IU]

## 2021-11-06 NOTE — Assessment & Plan Note (Signed)
Her last imaging studies showed new cavitary lesion, cause is unknown There were no significant signs of exposure to exotic animals, recent foreign travels, new drugs or others Reaction to her bevacizumab will be unusual  She is asymptomatic while on treatment break I plan to repeat CT imaging early September for further follow-up

## 2021-11-06 NOTE — Assessment & Plan Note (Signed)
She has chronic kidney disease stage III, stable We discussed importance of adequate hydration and aggressive blood pressure control

## 2021-11-06 NOTE — Telephone Encounter (Signed)
Patient called and informed that new handicap placard form had been completed by Dr. Alvy Bimler. Form left at front desk for patient to pick-up and complete.  Patient verbalized an understanding of the information.

## 2021-11-06 NOTE — Progress Notes (Signed)
[place] OFFICE PROGRESS NOTE  Patient Care Team: Gaynelle Arabian, MD as PCP - General (Family Medicine) Heath Lark, MD as Consulting Physician (Hematology and Oncology) Everitt Amber, MD as Consulting Physician (Obstetrics and Gynecology)  ASSESSMENT & PLAN:  Adenocarcinoma of right fallopian tube [facility]) Her last imaging studies showed new cavitary lesion, cause is unknown There were no significant signs of exposure to exotic animals, recent foreign travels, new drugs or others Reaction to her bevacizumab will be unusual  She is asymptomatic while on treatment break I plan to repeat CT imaging early September for further follow-up  Burn from the sun She has diffuse sunburn throughout I recommend the patient to avoid sun exposure for the next 2 months I suspect her skin itching is part of injury  CKD (chronic kidney disease), stage III ([place]) She has chronic kidney disease stage III, stable We discussed importance of adequate hydration and aggressive blood pressure control  Essential hypertension She has uncontrolled hypertension, likely precipitated by recent stress related to death of a family member Previously, she did not tolerate hydralazine or increased dose lisinopril or diuretic therapy due to changes in electrolytes I recommend a trial of Cardura The patient is instructed to continue checking her blood pressure twice daily and to call me next week for update on her blood pressure control  Orders Placed This Encounter  Procedures   CT Chest Wo Contrast    Standing Status:   Future    Standing Expiration Date:   11/06/2022    Order Specific Question:   Preferred imaging location?    Answer:   [facility]    All questions were answered. The patient knows to call the clinic with any problems, questions or concerns. The total time spent in the appointment was 30 minutes encounter with patients including review of chart and various tests results,  discussions about plan of care and coordination of care plan   Heath Lark, MD 11/06/2021 10:03 AM  INTERVAL HISTORY: Please see below for problem oriented charting. she returns for surveillance follow-up with her husband She showed me documentation of her blood pressure from home.  Majority of her blood pressure is very high She is not symptomatic She had recent trip to the beach and had significant skin rash The skin rash is itchy.  Her findings today is consistent with sunburn She is undergoing a lot of stress in the family due to death of a family member She denies recent cough, chest pain or shortness of breath  REVIEW OF SYSTEMS:   Constitutional: Denies fevers, chills or abnormal weight loss Eyes: Denies blurriness of vision Ears, nose, mouth, throat, and face: Denies mucositis or sore throat Respiratory: Denies cough, dyspnea or wheezes Cardiovascular: Denies palpitation, chest discomfort or lower extremity swelling Gastrointestinal:  Denies nausea, heartburn or change in bowel habits Lymphatics: Denies new lymphadenopathy or easy bruising Neurological:Denies numbness, tingling or new weaknesses Behavioral/Psych: Mood is stable, no new changes  All other systems were reviewed with the patient and are negative.  I have reviewed the past medical history, past surgical history, social history and family history with the patient and they are unchanged from previous note.  ALLERGIES:  is allergic to dilaudid [hydromorphone hcl], morphine and related, and sudafed [pseudoephedrine hcl].  MEDICATIONS:  Current Outpatient Medications  Medication Sig Dispense Refill   doxazosin (CARDURA) 4 MG tablet Take 1 tablet (4 mg total) by mouth daily. 30 tablet 1   acetaminophen (TYLENOL) 325 MG tablet Take  650 mg by mouth every 6 (six) hours as needed for headache (pain).     atenolol (TENORMIN) 25 MG tablet TAKE 1 TABLET BY MOUTH EVERY DAY 30 tablet 2   cloNIDine (CATAPRES) 0.1 MG tablet  TAKE 1 TABLET BY MOUTH EVERY DAY 30 tablet 1   hydrochlorothiazide (HYDRODIURIL) 25 MG tablet TAKE 1 TABLET (25 MG TOTAL) BY MOUTH DAILY. 30 tablet 3   lidocaine-prilocaine (EMLA) cream Apply to affected area once daily as directed. (Patient taking differently: 1 application. as needed (access port). Apply to affected area once daily as directed.) 30 g 3   lisinopril (ZESTRIL) 20 MG tablet TAKE 1 TABLET(20 MG) BY MOUTH DAILY (Patient taking differently: Take 20 mg by mouth every evening.) 90 tablet 3   venlafaxine XR (EFFEXOR-XR) 37.5 MG 24 hr capsule TAKE 1 CAPSULE BY MOUTH DAILY WITH BREAKFAST 90 capsule 3   No current facility-administered medications for this visit.   Facility-Administered Medications Ordered in Other Visits  Medication Dose Route Frequency Provider Last Rate Last Admin   sodium chloride flush (NS) 0.9 % injection 10 mL  10 mL Intravenous PRN Alvy Bimler, Akul Leggette, MD   10 mL at 02/11/17 0839   sodium chloride flush (NS) 0.9 % injection 10 mL  10 mL Intravenous PRN Alvy Bimler, Tracy Gerken, MD   10 mL at 03/04/17 1510    SUMMARY OF ONCOLOGIC HISTORY: Oncology History Overview Note  Neg genetics   Adenocarcinoma of right fallopian tube ([place])  08/12/2016 Initial Diagnosis   The patient has many months of vague symptoms of fullness in the pelvis, urinary frequency and difficulty with defecation. She denies vaginal bleeding or rectal bleeding.     08/12/2016 Imaging   Abdomen X-ray in the ER: Moderate colonic stool burden without evidence of enteric obstruction   11/13/2016 Imaging   TVUS was performed on 11/13/16 which showed a large hypoechoic lobular mass seen posterior to the uterus measuring 18.2x16.1x11.8cm, blood flow was seen along the periphery of the mass. It was considered to be likely to be a fibroid vs pelvic mass vs ovarian mass. Neither ovary was removed. Moderate amount of free fluid in the pelvis. THe uterus measures 4x10x4cm. The endometrium is 12m.    12/05/2016 Imaging   MR  pelvis 1. Mild limitations as detailed above. 2. Heterogeneous pelvic mass is favored to arise from the right ovary. Favor a solid ovarian neoplasm such as fibroma/Brenner's tumor. Given lesion size and heterogeneous T2 signal out, complicating torsion cannot be excluded. 3. Moderate abdominopelvic ascites, without specific evidence of peritoneal metastasis. Consider further evaluation with contrast-enhanced abdominopelvic CT.    12/26/2016 Pathology Results   1. Uterus, ovaries and fallopian tubes - ADENOCARCINOMA INVOLVING RIGHT FALLOPIAN TUBE, RIGHT AND LEFT OVARIES, UTERINE SEROSA AND PELVIC MASS. - SEE ONCOLOGY TABLE AND COMMENT. - UTERINE CERVIX, ENDOMETRIUM, MYOMETRIUM AND LEFT FALLOPIAN TUBE FREE OF TUMOR. 2. Omentum, resection for tumor - ADENOCARCINOMA. Microscopic Comment 1. ONCOLOGY TABLE - FALLOPIAN TUBE. SEE COMMENT 1. Specimen, including laterality: Uterus, bilateral adnexa, pelvic mass and omentum. 2. Procedure: Hysterectomy with bilateral salpingo-oophorectomy, pelvic mass excision and omentectomy. 3. Lymph node sampling performed: No 4. Tumor site: See comment. 5. Tumor location in fallopian tube: Right fallopian tube fimbria 6. Specimen integrity (intact/ruptured/disrupted): Intact 7. Tumor size (cm): 18 cm, see comment. 8. Histologic type: Adenocarcinoma 9. Grade: High grade 10. Microscopic tumor extension: Tumor involves right fallopian tube, right and left ovaries, uterine serosa, pelvic mass and omentum. 11. Margins: See comment. 12. Lymph-Vascular invasion: Present 13. Lymph  nodes: # examined: 0; # positive: N/A 14. TNM: pT3c, pNX 15. FIGO Stage (based on pathologic findings, needs clinical correlation: III-C 16. Comments: There is an 18 cm pelvic mass which is a high grade adenocarcinoma and there is a 12.2 cm  segment of omentum which is extensively involved with adenocarcinoma. The tumor also involves the fimbria of the right fallopian tube and the parenchyma  of the right and left ovaries as well as uterine serosa. The fimbria of the right fallopian has intraepithelial atypia consistent with a precursor lesion and therefore this is most consistent with primary fallopian tube adenocarcinoma. A primary peritoneal serous carcinoma is a less likely possibility.    12/26/2016 Pathology Results   PERITONEAL/ASCITIC FLUID(SPECIMEN 1 OF 1 COLLECTED 12/26/16): POORLY DIFFERENTIATED ADENOCARCINOMA   12/26/2016 Surgery   Procedure(s) Performed: Exploratory laparotomy with total abdominal hysterectomy, bilateral salpingo-oophorectomy, omentectomy radical tumor debulking for ovarian cancer .   Surgeon: Thereasa Solo, MD.      Operative Findings: 20cm left ovarian mass densely adherent to the posterior uterus, right tube and ovary, cervix, sigmoid colon. 10cm omental cake. 4L ascites. 50m size tumor nodules on the serosa of the terminal ileum and proximal sigmoid colon. 17mnodules on right diaphragm.    This represented an optimal cytoreduction (R1) with 15m33modules on intestine and diaphragm representing gross visible disease   01/16/2017 Procedure   Placement of single lumen port a cath via right internal jugular vein. The catheter tip lies at the cavo-atrial junction. A power injectable port a cath was placed and is ready for immediate use.   01/17/2017 Imaging   1. 3.5 x 7.2 x 4.6 cm fluid collection along the vaginal cuff, posterior the bladder and extending into the right adnexal space. This lesion demonstrates rim enhancement. Imaging features could be related to a loculated postoperative seroma or hematoma. Superinfection cannot be excluded by CT. Given debulking surgery was 3 weeks ago, this entire structure is un likely to represent neoplasm, but peritoneal involvement could have this appearance. 2. Small fluid collection in the left para colic gutter without rim enhancement. 3. Irregular/nodular appearance of the peritoneal M in the anatomic pelvis with areas of  subtle nodularity between the stomach and the spleen. Close attention in these regions on follow-up recommended as metastatic disease is a concern. 4. Mildly enlarged hepatoduodenal ligament lymph node. Attention on follow-up recommended.   01/20/2017 Tumor Marker   Patient's tumor was tested for the following markers: CA-125 Results of the tumor marker test revealed 46.6   01/28/2017 Genetic Testing       Negative genetic testing on the [facility].  The [facility] panel offered by [employer] sequencing and deletion/duplication testing of the following 28 genes: APC, ATM, BARD1, BMPR1A, BRCA1, BRCA2, BRIP1, CHD1, CDK4, CDKN2A, CHEK2, EPCAM (large rearrangement only), MLH1, MSH2, MSH6, MUTYH, NBN, PALB2, PMS2, PTEN, RAD51C, RAD51D, SMAD4, STK11, and TP53. Sequencing was performed for select regions of POLE and POLD1, and large rearrangement analysis was performed for select regions of GREM1. The report date is January 28, 2017.  HRD testing looking for genomic instability and BRCA mutations was negative.  The report date of this test is January 27, 2017.    01/31/2017 Tumor Marker   Patient's tumor was tested for the following markers: CA-125 Results of the tumor marker test revealed 22.4   03/04/2017 Tumor Marker   Patient's tumor was tested for the following markers: CA-125 Results of the tumor marker test revealed 13.8  04/18/2017 Tumor Marker   Patient's tumor was tested for the following markers: CA-125 Results of the tumor marker test revealed 11.9   05/05/2017 Tumor Marker   Patient's tumor was tested for the following markers: CA-125 Results of the tumor marker test revealed 12   05/26/2017 Tumor Marker   Patient's tumor was tested for the following markers: CA-125 Results of the tumor marker test revealed 10.6   05/26/2017 Imaging   1. A previously noted postoperative fluid collection in the low pelvis and residual ascites seen on the prior  examination has resolved on today's study. No findings to suggest residual/recurrent disease on today's examination. No definite solid organ metastasis identified in the abdomen or pelvis. No lymphadenopathy. 2. Aortic atherosclerosis.   06/10/2017 Procedure   Successful right IJ vein Port-A-Cath explant.   03/09/2018 Tumor Marker   Patient's tumor was tested for the following markers: CA-125 Results of the tumor marker test revealed 12.1   05/26/2018 Tumor Marker   Patient's tumor was tested for the following markers: CA-125 Results of the tumor marker test revealed 13.8   09/09/2018 Tumor Marker   Patient's tumor was tested for the following markers: CA-125 Results of the tumor marker test revealed 23.9   12/18/2018 Tumor Marker   Patient's tumor was tested for the following markers: CA-125 Results of the tumor marker test revealed 43.8   02/27/2019 - 02/28/2019 Hospital Admission   She was admitted for bowel obstruction   02/27/2019 Imaging   1. High-grade small bowel obstruction with transition point in the pelvis in an area of suspected desmoplastic reaction surrounding a 1.3 cm spiculated nodule in the mesentery, suspicious for carcinoid. There is hyperenhancement near the tip of the appendix and loss of the normal fat plane between it and the adjacent sigmoid colon, potentially the site of primary lesion. 2. Multiple new small subcentimeter hyperdense lesions scattered along the liver capsule, concerning for metastases. Enlarged hyperenhancing gastrohepatic and portacaval lymph nodes concerning for nodal metastases. 3. Very mild right hydroureteronephrosis to the level of the desmoplastic reaction in the pelvis, potentially involving the right ureter. 4. Infraumbilical ventral abdominal wall diastasis containing a small nondilated portion of transverse colon. 5. Trace ascites. 6. Cholelithiasis. 7.  Aortic atherosclerosis (ICD10-I70.0).     03/05/2019 Tumor Marker   Patient's tumor  was tested for the following markers: CA-125 Results of the tumor marker test revealed 70.1   03/12/2019 Echocardiogram   IMPRESSIONS     1. Left ventricular ejection fraction, by visual estimation, is 60 to 65%. The left ventricle has normal function. There is no left ventricular hypertrophy.  2. Left ventricular diastolic parameters are consistent with Grade I diastolic dysfunction (impaired relaxation).  3. Global right ventricle has normal systolic function.The right ventricular size is normal. No increase in right ventricular wall thickness.  4. Left atrial size was normal.  5. Right atrial size was normal.  6. The pericardium was not assessed.  7. The mitral valve is normal in structure. No evidence of mitral valve regurgitation.  8. The tricuspid valve is normal in structure. Tricuspid valve regurgitation is trivial.  9. The aortic valve is normal in structure. Aortic valve regurgitation is not visualized. 10. The pulmonic valve was grossly normal. Pulmonic valve regurgitation is not visualized. 11. The average left ventricular global longitudinal strain is -17.6 %.   03/16/2019 Procedure   Successful placement of a right internal jugular approach power injectable Port-A-Cath. The catheter is ready for immediate use.     04/19/2019  Tumor Marker   Patient's tumor was tested for the following markers: CA-125 Results of the tumor marker test revealed 39.8   05/17/2019 Tumor Marker   Patient's tumor was tested for the following markers: CA-125 Results of the tumor marker test revealed 27   05/28/2019 Tumor Marker   Patient's tumor was tested for the following markers: CA-125 Results of the tumor marker test revealed 27.7.   06/11/2019 Imaging   1. No new or progressive metastatic disease in the abdomen or pelvis. 2. Tiny noncalcified perisplenic implant is decreased. Calcified pericardiophrenic, retroperitoneal and bilateral inguinal lymph nodes and scattered small calcified  perihepatic and perisplenic implants are stable. 3.  Aortic Atherosclerosis (ICD10-I70.0).       06/17/2019 Echocardiogram    1. Left ventricular ejection fraction, by estimation, is 65 to 70%. The left ventricle has normal function. The left ventricle has no regional wall motion abnormalities. Left ventricular diastolic parameters were normal.  2. Right ventricular systolic function is normal. The right ventricular size is normal.  3. The mitral valve is normal in structure and function. No evidence of mitral valve regurgitation. No evidence of mitral stenosis.  4. The aortic valve is normal in structure and function. Aortic valve regurgitation is not visualized. No aortic stenosis is present.   06/21/2019 Tumor Marker   Patient's tumor was tested for the following markers: CA-125 Results of the tumor marker test revealed 18.7   07/19/2019 Tumor Marker   Patient's tumor was tested for the following markers: CA-125 Results of the tumor marker test revealed 16.7   08/30/2019 Tumor Marker   Patient's tumor was tested for the following markers: CA-125. Results of the tumor marker test revealed 13.9   09/15/2019 Echocardiogram    1. Left ventricular ejection fraction, by estimation, is 65 to 70%. The left ventricle has normal function. The left ventricle has no regional wall motion abnormalities. Left ventricular diastolic parameters are consistent with Grade I diastolic dysfunction (impaired relaxation). The average left ventricular global longitudinal strain is 18.1 %. The global longitudinal strain is normal.  2. Right ventricular systolic function is normal. The right ventricular size is normal.  3. The mitral valve is normal in structure. No evidence of mitral valve regurgitation. No evidence of mitral stenosis.  4. The aortic valve is normal in structure. Aortic valve regurgitation is not visualized. No aortic stenosis is present.  5. The inferior vena cava is normal in size with greater than  50% respiratory variability, suggesting right atrial pressure of 3 mmHg.     09/24/2019 Imaging   1. Stable exam. No new or progressive metastatic disease on today's study. 2. Small subcapsular hypodensity in the lateral spleen, new in the interval, but indeterminate. Attention on follow-up recommended. 3. The calcified upper abdominal and groin lymph nodes are stable as are the scattered calcified and noncalcified peritoneal implants. 4. Stable midline ventral hernia containing a short segment of colon without complicating features. 5. Aortic Atherosclerosis (ICD10-I70.0).     10/04/2019 - 09/21/2021 Chemotherapy   Patient is on Treatment Plan : ovarian Bevacizumab q21d     10/04/2019 Tumor Marker   Patient's tumor was tested for the following markers: CA-125. Results of the tumor marker test revealed 12.9.   11/01/2019 Tumor Marker   Patient's tumor was tested for the following markers: CA-125 Results of the tumor marker test revealed 15.4   12/13/2019 Tumor Marker   Patient's tumor was tested for the following markers: CA-125 Results of the tumor marker test revealed 14.8  12/31/2019 Imaging   1. Unchanged tiny peritoneal nodule adjacent to the spleen measuring 6 mm. Other previously noted peritoneal nodules are imperceptible on present examination. 2. Unchanged prominent, partially calcified portacaval lymph node and bilateral inguinal lymph nodes. 3. Unchanged subtle, nonspecific subcapsular lesion of the spleen.  4. No evidence of new metastatic disease in the abdomen or pelvis. 5. Status post hysterectomy. 6. Unchanged low midline ventral abdominal hernia containing a single loop of nonobstructed transverse colon. 7. Cholelithiasis. 8. Aortic Atherosclerosis (ICD10-I70.0).       01/03/2020 Tumor Marker   Patient's tumor was tested for the following markers: CA-125 Results of the tumor marker test revealed 11.7.   01/24/2020 Tumor Marker   Patient's tumor was tested for the  following markers: CA-125. Results of the tumor marker test revealed 15.1   03/06/2020 Tumor Marker   Patient's tumor was tested for the following markers: CA-125. Results of the tumor marker test revealed 20.3   03/27/2020 Tumor Marker   Patient's tumor was tested for the following markers: CA-125 Results of the tumor marker test revealed 23.7   05/11/2020 Tumor Marker   Patient's tumor was tested for the following markers: CA-125. Results of the tumor marker test revealed 25.4   06/23/2020 Imaging   1. Cholelithiasis with gallbladder wall thickening and mild infiltrative edema in the porta hepatis, raising the possibility of acute cholecystitis. There is truncation of the common bile duct distally and distal choledocholithiasis is difficult to exclude. Correlate with bilirubin levels. If clinically warranted, MRCP could be utilized for further characterization. 2. Stable tiny perisplenic nodule. 3. Other imaging findings of potential clinical significance: Small type 1 hiatal hernia. Prominent stool throughout the colon favors constipation. Ventral infraumbilical hernia contains transverse colon without findings of strangulation or obstruction. Small supraumbilical hernia contains adipose tissue. Lumbar degenerative disc disease at L5-S1. Stable tiny nodule along the lateral margin of the spleen, nonspecific. 4. Aortic atherosclerosis.     12/15/2020 Imaging   No evidence of recurrent or metastatic carcinoma within the abdomen or pelvis.   Stable ventral hernia containing transverse colon. No evidence of bowel obstruction or strangulation.   Cholelithiasis. No radiographic evidence of cholecystitis.   Tiny hiatal hernia.   Aortic Atherosclerosis (ICD10-I70.0).     06/11/2021 Imaging   1. Numerous sub 4 mm pulmonary nodules in the lungs some of which have a cavitary appearance. I think it is unlikely this is metastatic disease and more likely a possible immunotherapy related process  such as a sarcoid like reaction. Recommend full chest CT further evaluation. 2. Stable small calcified retroperitoneal and mesenteric lymph nodes. No findings suspicious for recurrent peritoneal carcinomatosis. 3. Stable lower abdominal wall hernia containing part of the transverse colon. 4. Cholelithiasis.   09/27/2021 Imaging   1. Numerous small pulmonary nodules throughout the lungs, the majority of which are cavitary. These are all subcentimeter, however nodules that were seen in the bilateral lung bases on prior CT dated 06/08/2021 appear slightly increased in size and number. Behavior is highly suspicious for pulmonary metastatic disease despite the unusual appearance, however as previously reported general differential considerations include atypical infection and inflammation, such as drug-induced sarcoidosis like reaction.  2. Unchanged calcified lymph nodes in the abdomen and pelvis, consistent with treated nodal metastases. 3. Cholelithiasis with wall thickening and pericholecystic fluid. This appearance is similar to prior examination and suggests chronic cholecystitis. Correlate for clinical symptoms. Mild intrahepatic biliary ductal dilatation, similar to prior examination. 4. Midline ventral hernia containing a single nonobstructed  loop of mid transverse colon.       PHYSICAL EXAMINATION: ECOG PERFORMANCE STATUS: 1 - Symptomatic but completely ambulatory  Vitals:   11/06/21 0932  BP: (!) 188/72  Pulse: 85  Resp: 18  Temp: 97.6 F (36.4 C)  SpO2: 100%   Filed Weights   11/06/21 0932  Weight: 150 lb (68 kg)    GENERAL:alert, no distress and comfortable SKIN: She has diffuse skin changes consistent with recent sunburn NEURO: alert & oriented x 3 with fluent speech, no focal motor/sensory deficits  LABORATORY DATA:  I have reviewed the data as listed    Component Value Date/Time   NA 131 (L) 11/06/2021 0906   NA 139 04/14/2017 0742   K 4.5 11/06/2021 0906   K 4.0  04/14/2017 0742   CL 100 11/06/2021 0906   CO2 26 11/06/2021 0906   CO2 21 (L) 04/14/2017 0742   GLUCOSE 143 (H) 11/06/2021 0906   GLUCOSE 247 (H) 04/14/2017 0742   BUN 29 (H) 11/06/2021 0906   BUN 13.1 04/14/2017 0742   CREATININE 1.27 (H) 11/06/2021 0906   CREATININE 0.99 03/12/2021 0832   CREATININE 0.9 04/14/2017 0742   CALCIUM 9.1 11/06/2021 0906   CALCIUM 9.2 04/14/2017 0742   PROT 6.1 (L) 11/06/2021 0906   PROT 7.3 04/14/2017 0742   ALBUMIN 3.6 11/06/2021 0906   ALBUMIN 3.9 04/14/2017 0742   AST 14 (L) 11/06/2021 0906   AST 18 03/12/2021 0832   AST 17 04/14/2017 0742   ALT 6 11/06/2021 0906   ALT 9 03/12/2021 0832   ALT 14 04/14/2017 0742   ALKPHOS 52 11/06/2021 0906   ALKPHOS 70 04/14/2017 0742   BILITOT 0.5 11/06/2021 0906   BILITOT 0.8 03/12/2021 0832   BILITOT 0.37 04/14/2017 0742   GFRNONAA 46 (L) 11/06/2021 0906   GFRNONAA >60 03/12/2021 0832   GFRAA >60 01/24/2020 0924   GFRAA >60 07/19/2019 0951    No results found for: "SPEP", "UPEP"  Lab Results  Component Value Date   WBC 6.8 11/06/2021   NEUTROABS 4.5 11/06/2021   HGB 13.0 11/06/2021   HCT 38.5 11/06/2021   MCV 91.2 11/06/2021   PLT 181 11/06/2021      Chemistry      Component Value Date/Time   NA 131 (L) 11/06/2021 0906   NA 139 04/14/2017 0742   K 4.5 11/06/2021 0906   K 4.0 04/14/2017 0742   CL 100 11/06/2021 0906   CO2 26 11/06/2021 0906   CO2 21 (L) 04/14/2017 0742   BUN 29 (H) 11/06/2021 0906   BUN 13.1 04/14/2017 0742   CREATININE 1.27 (H) 11/06/2021 0906   CREATININE 0.99 03/12/2021 0832   CREATININE 0.9 04/14/2017 0742      Component Value Date/Time   CALCIUM 9.1 11/06/2021 0906   CALCIUM 9.2 04/14/2017 0742   ALKPHOS 52 11/06/2021 0906   ALKPHOS 70 04/14/2017 0742   AST 14 (L) 11/06/2021 0906   AST 18 03/12/2021 0832   AST 17 04/14/2017 0742   ALT 6 11/06/2021 0906   ALT 9 03/12/2021 0832   ALT 14 04/14/2017 0742   BILITOT 0.5 11/06/2021 0906   BILITOT 0.8  03/12/2021 0832   BILITOT 0.37 04/14/2017 2395

## 2021-11-06 NOTE — Assessment & Plan Note (Signed)
She has diffuse sunburn throughout I recommend the patient to avoid sun exposure for the next 2 months I suspect her skin itching is part of injury

## 2021-11-06 NOTE — Assessment & Plan Note (Signed)
She has uncontrolled hypertension, likely precipitated by recent stress related to death of a family member Previously, she did not tolerate hydralazine or increased dose lisinopril or diuretic therapy due to changes in electrolytes I recommend a trial of Cardura The patient is instructed to continue checking her blood pressure twice daily and to call me next week for update on her blood pressure control

## 2021-11-12 ENCOUNTER — Telehealth: Payer: Self-pay

## 2021-11-12 NOTE — Telephone Encounter (Signed)
Returned her call. She saw Dr. Elvera Lennox with dermatology and had a biopsy to her arm. The biopsy came back lupus and Dr. Elvera Lennox has sent a referral to rheumatologist.  Just FYI.

## 2021-11-13 ENCOUNTER — Encounter: Payer: Self-pay | Admitting: Hematology and Oncology

## 2021-11-15 ENCOUNTER — Encounter: Payer: Self-pay | Admitting: Hematology and Oncology

## 2021-11-24 ENCOUNTER — Other Ambulatory Visit: Payer: Self-pay | Admitting: Hematology and Oncology

## 2021-11-26 ENCOUNTER — Encounter: Payer: Self-pay | Admitting: Hematology and Oncology

## 2021-11-27 ENCOUNTER — Encounter: Payer: Self-pay | Admitting: Hematology and Oncology

## 2021-12-16 ENCOUNTER — Other Ambulatory Visit: Payer: Self-pay | Admitting: Hematology and Oncology

## 2021-12-17 ENCOUNTER — Encounter: Payer: Self-pay | Admitting: Hematology and Oncology

## 2021-12-25 ENCOUNTER — Inpatient Hospital Stay: Payer: Medicare Other | Attending: Hematology and Oncology

## 2021-12-25 ENCOUNTER — Other Ambulatory Visit: Payer: Self-pay

## 2021-12-25 ENCOUNTER — Encounter: Payer: Self-pay | Admitting: Hematology and Oncology

## 2021-12-25 ENCOUNTER — Encounter (HOSPITAL_COMMUNITY): Payer: Self-pay

## 2021-12-25 ENCOUNTER — Ambulatory Visit (HOSPITAL_COMMUNITY)
Admission: RE | Admit: 2021-12-25 | Discharge: 2021-12-25 | Disposition: A | Discharge status: Home / Self Care | Payer: Medicare Other | Source: Ambulatory Visit | Attending: Hematology and Oncology | Admitting: Hematology and Oncology

## 2021-12-25 DIAGNOSIS — C5701 Malignant neoplasm of right fallopian tube: Secondary | ICD-10-CM | POA: Insufficient documentation

## 2021-12-25 DIAGNOSIS — C786 Secondary malignant neoplasm of retroperitoneum and peritoneum: Secondary | ICD-10-CM | POA: Insufficient documentation

## 2021-12-25 DIAGNOSIS — Z79899 Other long term (current) drug therapy: Secondary | ICD-10-CM | POA: Insufficient documentation

## 2021-12-25 DIAGNOSIS — C7963 Secondary malignant neoplasm of bilateral ovaries: Secondary | ICD-10-CM | POA: Insufficient documentation

## 2021-12-25 DIAGNOSIS — Z7189 Other specified counseling: Secondary | ICD-10-CM

## 2021-12-25 LAB — TOTAL PROTEIN, URINE DIPSTICK: Protein, ur: <30 mg/dL — AB

## 2021-12-25 LAB — CBC WITH DIFFERENTIAL/PLATELET
Abs Immature Granulocytes: 0.01 10*3/uL (ref 0.00–0.07)
Basophils Absolute: 0 10*3/uL (ref 0.0–0.1)
Basophils Relative: 1 %
Eosinophils Absolute: 0.4 10*3/uL (ref 0.0–0.5)
Eosinophils Relative: 8 %
HCT: 34.7 % — ABNORMAL LOW (ref 36.0–46.0)
Hemoglobin: 11.5 g/dL — ABNORMAL LOW (ref 12.0–15.0)
Immature Granulocytes: 0 %
Lymphocytes Relative: 18 %
Lymphs Abs: 0.9 10*3/uL (ref 0.7–4.0)
MCH: 30.7 pg (ref 26.0–34.0)
MCHC: 33.1 g/dL (ref 30.0–36.0)
MCV: 92.5 fL (ref 80.0–100.0)
Monocytes Absolute: 0.5 10*3/uL (ref 0.1–1.0)
Monocytes Relative: 10 %
Neutro Abs: 3.2 10*3/uL (ref 1.7–7.7)
Neutrophils Relative %: 63 %
Platelets: 161 10*3/uL (ref 150–400)
RBC: 3.75 MIL/uL — ABNORMAL LOW (ref 3.87–5.11)
RDW: 15.3 % (ref 11.5–15.5)
WBC: 5.1 10*3/uL (ref 4.0–10.5)
nRBC: 0 % (ref 0.0–0.2)

## 2021-12-25 LAB — COMPREHENSIVE METABOLIC PANEL
ALT: 5 U/L (ref 0–44)
AST: 13 U/L — ABNORMAL LOW (ref 15–41)
Albumin: 3.5 g/dL (ref 3.5–5.0)
Alkaline Phosphatase: 50 U/L (ref 38–126)
Anion gap: 6 (ref 5–15)
BUN: 27 mg/dL — ABNORMAL HIGH (ref 8–23)
CO2: 25 mmol/L (ref 22–32)
Calcium: 8.9 mg/dL (ref 8.9–10.3)
Chloride: 106 mmol/L (ref 98–111)
Creatinine, Ser: 1.38 mg/dL — ABNORMAL HIGH (ref 0.44–1.00)
GFR, Estimated: 42 mL/min — ABNORMAL LOW (ref >60)
Glucose, Bld: 98 mg/dL (ref 70–99)
Potassium: 4.6 mmol/L (ref 3.5–5.1)
Sodium: 137 mmol/L (ref 135–145)
Total Bilirubin: 0.5 mg/dL (ref 0.3–1.2)
Total Protein: 5.8 g/dL — ABNORMAL LOW (ref 6.5–8.1)

## 2021-12-25 MED ORDER — SODIUM CHLORIDE 0.9% FLUSH
10.0000 mL | Freq: Once | INTRAVENOUS | Status: AC
  Administered 2021-12-25: 10 mL

## 2021-12-25 MED ORDER — HEPARIN SOD (PORK) LOCK FLUSH 100 UNIT/ML IV SOLN
500.0000 [IU] | Freq: Once | INTRAVENOUS | Status: AC
  Administered 2021-12-25: 500 [IU]

## 2021-12-27 ENCOUNTER — Encounter: Payer: Self-pay | Admitting: Hematology and Oncology

## 2021-12-27 ENCOUNTER — Other Ambulatory Visit: Payer: Self-pay

## 2021-12-27 ENCOUNTER — Inpatient Hospital Stay (HOSPITAL_BASED_OUTPATIENT_CLINIC_OR_DEPARTMENT_OTHER): Payer: Medicare Other | Admitting: Hematology and Oncology

## 2021-12-27 DIAGNOSIS — C786 Secondary malignant neoplasm of retroperitoneum and peritoneum: Secondary | ICD-10-CM | POA: Diagnosis not present

## 2021-12-27 DIAGNOSIS — L299 Pruritus, unspecified: Secondary | ICD-10-CM

## 2021-12-27 DIAGNOSIS — Z79899 Other long term (current) drug therapy: Secondary | ICD-10-CM | POA: Diagnosis not present

## 2021-12-27 DIAGNOSIS — C7963 Secondary malignant neoplasm of bilateral ovaries: Secondary | ICD-10-CM | POA: Diagnosis not present

## 2021-12-27 DIAGNOSIS — C5701 Malignant neoplasm of right fallopian tube: Secondary | ICD-10-CM | POA: Diagnosis present

## 2021-12-27 DIAGNOSIS — I10 Essential (primary) hypertension: Secondary | ICD-10-CM

## 2021-12-27 DIAGNOSIS — J984 Other disorders of lung: Secondary | ICD-10-CM

## 2021-12-27 NOTE — Assessment & Plan Note (Signed)
The cause is unknown Observe only I plan to repeat CT imaging again end of the year

## 2021-12-27 NOTE — Assessment & Plan Note (Signed)
She has diffuse skin rash and is currently undergoing work-up I will defer to her dermatologist for management

## 2021-12-27 NOTE — Progress Notes (Signed)
[facility] OFFICE PROGRESS NOTE  Patient Care Team: Gaynelle Arabian, MD as PCP - General (Family Medicine) Heath Lark, MD as Consulting Physician (Hematology and Oncology) Everitt Amber, MD as Consulting Physician (Obstetrics and Gynecology)  ASSESSMENT & PLAN:  Adenocarcinoma of right fallopian tube [facility]) I have reviewed her CT imaging with the patient and her husband There is nothing new The cause of her lung nodules are unknown and it is difficult to biopsy This is not typical appearance for metastatic right fallopian tube cancer to the lungs Recommend close observation with repeat imaging study in 3 months I will see her again in 6 weeks for further follow-up  Cavitary lesion of lung The cause is unknown Observe only I plan to repeat CT imaging again end of the year  Essential hypertension She has better blood pressure control now She will continue current antihypertensives The patient is instructed to continue checking her blood pressure twice daily and to call me next week for update on her blood pressure control  Itch of skin She has diffuse skin rash and is currently undergoing work-up I will defer to her dermatologist for management  Orders Placed This Encounter  Procedures   CA 125    Standing Status:   Standing    Number of Occurrences:   11    Standing Expiration Date:   12/28/2022    All questions were answered. The patient knows to call the clinic with any problems, questions or concerns. The total time spent in the appointment was 30 minutes encounter with patients including review of chart and various tests results, discussions about plan of care and coordination of care plan   Heath Lark, MD 12/27/2021 9:54 AM  INTERVAL HISTORY: Please see below for problem oriented charting. she returns for treatment follow-up with her husband to review CT imaging results She is not symptomatic Denies recent cough, chest pain or shortness of breath No  recent changes in bowel habits I reviewed her documentation of blood pressure from home which is within normal range She continues to have diffuse skin rash and skin itching and is currently referred to see another expert in [facility]  REVIEW OF SYSTEMS:   Constitutional: Denies fevers, chills or abnormal weight loss Eyes: Denies blurriness of vision Ears, nose, mouth, throat, and face: Denies mucositis or sore throat Respiratory: Denies cough, dyspnea or wheezes Cardiovascular: Denies palpitation, chest discomfort or lower extremity swelling Gastrointestinal:  Denies nausea, heartburn or change in bowel habits Lymphatics: Denies new lymphadenopathy or easy bruising Neurological:Denies numbness, tingling or new weaknesses Behavioral/Psych: Mood is stable, no new changes  All other systems were reviewed with the patient and are negative.  I have reviewed the past medical history, past surgical history, social history and family history with the patient and they are unchanged from previous note.  ALLERGIES:  is allergic to dilaudid [hydromorphone hcl], morphine and related, and sudafed [pseudoephedrine hcl].  MEDICATIONS:  Current Outpatient Medications  Medication Sig Dispense Refill   acetaminophen (TYLENOL) 325 MG tablet Take 650 mg by mouth every 6 (six) hours as needed for headache (pain).     atenolol (TENORMIN) 25 MG tablet TAKE 1 TABLET BY MOUTH EVERY DAY 30 tablet 2   cloNIDine (CATAPRES) 0.1 MG tablet TAKE 1 TABLET BY MOUTH EVERY DAY 30 tablet 1   doxazosin (CARDURA) 4 MG tablet TAKE 1 TABLET BY MOUTH EVERY DAY 30 tablet 1   hydrochlorothiazide (HYDRODIURIL) 25 MG tablet TAKE 1 TABLET (25 MG TOTAL) BY  MOUTH DAILY. 30 tablet 3   lidocaine-prilocaine (EMLA) cream Apply to affected area once daily as directed. (Patient taking differently: 1 application. as needed (access port). Apply to affected area once daily as directed.) 30 g 3   lisinopril (ZESTRIL) 20 MG tablet TAKE 1  TABLET BY MOUTH EVERY DAY IN THE EVENING 60 tablet 0   venlafaxine XR (EFFEXOR-XR) 37.5 MG 24 hr capsule TAKE 1 CAPSULE BY MOUTH DAILY WITH BREAKFAST 90 capsule 3   No current facility-administered medications for this visit.   Facility-Administered Medications Ordered in Other Visits  Medication Dose Route Frequency Provider Last Rate Last Admin   sodium chloride flush (NS) 0.9 % injection 10 mL  10 mL Intravenous PRN Alvy Bimler, Ni, MD   10 mL at 02/11/17 0839   sodium chloride flush (NS) 0.9 % injection 10 mL  10 mL Intravenous PRN Alvy Bimler, Ni, MD   10 mL at 03/04/17 1510    SUMMARY OF ONCOLOGIC HISTORY: Oncology History Overview Note  Neg genetics   Adenocarcinoma of right fallopian tube ([place])  08/12/2016 Initial Diagnosis   The patient has many months of vague symptoms of fullness in the pelvis, urinary frequency and difficulty with defecation. She denies vaginal bleeding or rectal bleeding.     08/12/2016 Imaging   Abdomen X-ray in the ER: Moderate colonic stool burden without evidence of enteric obstruction   11/13/2016 Imaging   TVUS was performed on 11/13/16 which showed a large hypoechoic lobular mass seen posterior to the uterus measuring 18.2x16.1x11.8cm, blood flow was seen along the periphery of the mass. It was considered to be likely to be a fibroid vs pelvic mass vs ovarian mass. Neither ovary was removed. Moderate amount of free fluid in the pelvis. THe uterus measures 4x10x4cm. The endometrium is 29m.    12/05/2016 Imaging   MR pelvis 1. Mild limitations as detailed above. 2. Heterogeneous pelvic mass is favored to arise from the right ovary. Favor a solid ovarian neoplasm such as fibroma/Brenner's tumor. Given lesion size and heterogeneous T2 signal out, complicating torsion cannot be excluded. 3. Moderate abdominopelvic ascites, without specific evidence of peritoneal metastasis. Consider further evaluation with contrast-enhanced abdominopelvic CT.    12/26/2016 Pathology  Results   1. Uterus, ovaries and fallopian tubes - ADENOCARCINOMA INVOLVING RIGHT FALLOPIAN TUBE, RIGHT AND LEFT OVARIES, UTERINE SEROSA AND PELVIC MASS. - SEE ONCOLOGY TABLE AND COMMENT. - UTERINE CERVIX, ENDOMETRIUM, MYOMETRIUM AND LEFT FALLOPIAN TUBE FREE OF TUMOR. 2. Omentum, resection for tumor - ADENOCARCINOMA. Microscopic Comment 1. ONCOLOGY TABLE - FALLOPIAN TUBE. SEE COMMENT 1. Specimen, including laterality: Uterus, bilateral adnexa, pelvic mass and omentum. 2. Procedure: Hysterectomy with bilateral salpingo-oophorectomy, pelvic mass excision and omentectomy. 3. Lymph node sampling performed: No 4. Tumor site: See comment. 5. Tumor location in fallopian tube: Right fallopian tube fimbria 6. Specimen integrity (intact/ruptured/disrupted): Intact 7. Tumor size (cm): 18 cm, see comment. 8. Histologic type: Adenocarcinoma 9. Grade: High grade 10. Microscopic tumor extension: Tumor involves right fallopian tube, right and left ovaries, uterine serosa, pelvic mass and omentum. 11. Margins: See comment. 12. Lymph-Vascular invasion: Present 13. Lymph nodes: # examined: 0; # positive: N/A 14. TNM: pT3c, pNX 15. FIGO Stage (based on pathologic findings, needs clinical correlation: III-C 16. Comments: There is an 18 cm pelvic mass which is a high grade adenocarcinoma and there is a 12.2 cm  segment of omentum which is extensively involved with adenocarcinoma. The tumor also involves the fimbria of the right fallopian tube and the parenchyma of the right  and left ovaries as well as uterine serosa. The fimbria of the right fallopian has intraepithelial atypia consistent with a precursor lesion and therefore this is most consistent with primary fallopian tube adenocarcinoma. A primary peritoneal serous carcinoma is a less likely possibility.    12/26/2016 Pathology Results   PERITONEAL/ASCITIC FLUID(SPECIMEN 1 OF 1 COLLECTED 12/26/16): POORLY DIFFERENTIATED ADENOCARCINOMA   12/26/2016 Surgery    Procedure(s) Performed: Exploratory laparotomy with total abdominal hysterectomy, bilateral salpingo-oophorectomy, omentectomy radical tumor debulking for ovarian cancer .   Surgeon: Thereasa Solo, MD.      Operative Findings: 20cm left ovarian mass densely adherent to the posterior uterus, right tube and ovary, cervix, sigmoid colon. 10cm omental cake. 4L ascites. 42m size tumor nodules on the serosa of the terminal ileum and proximal sigmoid colon. 1359mnodules on right diaphragm.    This represented an optimal cytoreduction (R1) with 59m60modules on intestine and diaphragm representing gross visible disease   01/16/2017 Procedure   Placement of single lumen port a cath via right internal jugular vein. The catheter tip lies at the cavo-atrial junction. A power injectable port a cath was placed and is ready for immediate use.   01/17/2017 Imaging   1. 3.5 x 7.2 x 4.6 cm fluid collection along the vaginal cuff, posterior the bladder and extending into the right adnexal space. This lesion demonstrates rim enhancement. Imaging features could be related to a loculated postoperative seroma or hematoma. Superinfection cannot be excluded by CT. Given debulking surgery was 3 weeks ago, this entire structure is un likely to represent neoplasm, but peritoneal involvement could have this appearance. 2. Small fluid collection in the left para colic gutter without rim enhancement. 3. Irregular/nodular appearance of the peritoneal M in the anatomic pelvis with areas of subtle nodularity between the stomach and the spleen. Close attention in these regions on follow-up recommended as metastatic disease is a concern. 4. Mildly enlarged hepatoduodenal ligament lymph node. Attention on follow-up recommended.   01/20/2017 Tumor Marker   Patient's tumor was tested for the following markers: CA-125 Results of the tumor marker test revealed 46.6   01/28/2017 Genetic Testing       Negative genetic testing on the [facility].  The [facility] panel offered by [employer] sequencing and deletion/duplication testing of the following 28 genes: APC, ATM, BARD1, BMPR1A, BRCA1, BRCA2, BRIP1, CHD1, CDK4, CDKN2A, CHEK2, EPCAM (large rearrangement only), MLH1, MSH2, MSH6, MUTYH, NBN, PALB2, PMS2, PTEN, RAD51C, RAD51D, SMAD4, STK11, and TP53. Sequencing was performed for select regions of POLE and POLD1, and large rearrangement analysis was performed for select regions of GREM1. The report date is January 28, 2017.  HRD testing looking for genomic instability and BRCA mutations was negative.  The report date of this test is January 27, 2017.    01/31/2017 Tumor Marker   Patient's tumor was tested for the following markers: CA-125 Results of the tumor marker test revealed 22.4   03/04/2017 Tumor Marker   Patient's tumor was tested for the following markers: CA-125 Results of the tumor marker test revealed 13.8   04/18/2017 Tumor Marker   Patient's tumor was tested for the following markers: CA-125 Results of the tumor marker test revealed 11.9   05/05/2017 Tumor Marker   Patient's tumor was tested for the following markers: CA-125 Results of the tumor marker test revealed 12   05/26/2017 Tumor Marker   Patient's tumor was tested for the following markers: CA-125 Results of the tumor marker test  revealed 10.6   05/26/2017 Imaging   1. A previously noted postoperative fluid collection in the low pelvis and residual ascites seen on the prior examination has resolved on today's study. No findings to suggest residual/recurrent disease on today's examination. No definite solid organ metastasis identified in the abdomen or pelvis. No lymphadenopathy. 2. Aortic atherosclerosis.   06/10/2017 Procedure   Successful right IJ vein Port-A-Cath explant.   03/09/2018 Tumor Marker   Patient's tumor was tested for the following markers: CA-125 Results of the tumor marker test revealed 12.1    05/26/2018 Tumor Marker   Patient's tumor was tested for the following markers: CA-125 Results of the tumor marker test revealed 13.8   09/09/2018 Tumor Marker   Patient's tumor was tested for the following markers: CA-125 Results of the tumor marker test revealed 23.9   12/18/2018 Tumor Marker   Patient's tumor was tested for the following markers: CA-125 Results of the tumor marker test revealed 43.8   02/27/2019 - 02/28/2019 Hospital Admission   She was admitted for bowel obstruction   02/27/2019 Imaging   1. High-grade small bowel obstruction with transition point in the pelvis in an area of suspected desmoplastic reaction surrounding a 1.3 cm spiculated nodule in the mesentery, suspicious for carcinoid. There is hyperenhancement near the tip of the appendix and loss of the normal fat plane between it and the adjacent sigmoid colon, potentially the site of primary lesion. 2. Multiple new small subcentimeter hyperdense lesions scattered along the liver capsule, concerning for metastases. Enlarged hyperenhancing gastrohepatic and portacaval lymph nodes concerning for nodal metastases. 3. Very mild right hydroureteronephrosis to the level of the desmoplastic reaction in the pelvis, potentially involving the right ureter. 4. Infraumbilical ventral abdominal wall diastasis containing a small nondilated portion of transverse colon. 5. Trace ascites. 6. Cholelithiasis. 7.  Aortic atherosclerosis (ICD10-I70.0).     03/05/2019 Tumor Marker   Patient's tumor was tested for the following markers: CA-125 Results of the tumor marker test revealed 70.1   03/12/2019 Echocardiogram   IMPRESSIONS     1. Left ventricular ejection fraction, by visual estimation, is 60 to 65%. The left ventricle has normal function. There is no left ventricular hypertrophy.  2. Left ventricular diastolic parameters are consistent with Grade I diastolic dysfunction (impaired relaxation).  3. Global right ventricle has  normal systolic function.The right ventricular size is normal. No increase in right ventricular wall thickness.  4. Left atrial size was normal.  5. Right atrial size was normal.  6. The pericardium was not assessed.  7. The mitral valve is normal in structure. No evidence of mitral valve regurgitation.  8. The tricuspid valve is normal in structure. Tricuspid valve regurgitation is trivial.  9. The aortic valve is normal in structure. Aortic valve regurgitation is not visualized. 10. The pulmonic valve was grossly normal. Pulmonic valve regurgitation is not visualized. 11. The average left ventricular global longitudinal strain is -17.6 %.   03/16/2019 Procedure   Successful placement of a right internal jugular approach power injectable Port-A-Cath. The catheter is ready for immediate use.     04/19/2019 Tumor Marker   Patient's tumor was tested for the following markers: CA-125 Results of the tumor marker test revealed 39.8   05/17/2019 Tumor Marker   Patient's tumor was tested for the following markers: CA-125 Results of the tumor marker test revealed 27   05/28/2019 Tumor Marker   Patient's tumor was tested for the following markers: CA-125 Results of the tumor marker test revealed  27.7.   06/11/2019 Imaging   1. No new or progressive metastatic disease in the abdomen or pelvis. 2. Tiny noncalcified perisplenic implant is decreased. Calcified pericardiophrenic, retroperitoneal and bilateral inguinal lymph nodes and scattered small calcified perihepatic and perisplenic implants are stable. 3.  Aortic Atherosclerosis (ICD10-I70.0).       06/17/2019 Echocardiogram    1. Left ventricular ejection fraction, by estimation, is 65 to 70%. The left ventricle has normal function. The left ventricle has no regional wall motion abnormalities. Left ventricular diastolic parameters were normal.  2. Right ventricular systolic function is normal. The right ventricular size is normal.  3. The  mitral valve is normal in structure and function. No evidence of mitral valve regurgitation. No evidence of mitral stenosis.  4. The aortic valve is normal in structure and function. Aortic valve regurgitation is not visualized. No aortic stenosis is present.   06/21/2019 Tumor Marker   Patient's tumor was tested for the following markers: CA-125 Results of the tumor marker test revealed 18.7   07/19/2019 Tumor Marker   Patient's tumor was tested for the following markers: CA-125 Results of the tumor marker test revealed 16.7   08/30/2019 Tumor Marker   Patient's tumor was tested for the following markers: CA-125. Results of the tumor marker test revealed 13.9   09/15/2019 Echocardiogram    1. Left ventricular ejection fraction, by estimation, is 65 to 70%. The left ventricle has normal function. The left ventricle has no regional wall motion abnormalities. Left ventricular diastolic parameters are consistent with Grade I diastolic dysfunction (impaired relaxation). The average left ventricular global longitudinal strain is 18.1 %. The global longitudinal strain is normal.  2. Right ventricular systolic function is normal. The right ventricular size is normal.  3. The mitral valve is normal in structure. No evidence of mitral valve regurgitation. No evidence of mitral stenosis.  4. The aortic valve is normal in structure. Aortic valve regurgitation is not visualized. No aortic stenosis is present.  5. The inferior vena cava is normal in size with greater than 50% respiratory variability, suggesting right atrial pressure of 3 mmHg.     09/24/2019 Imaging   1. Stable exam. No new or progressive metastatic disease on today's study. 2. Small subcapsular hypodensity in the lateral spleen, new in the interval, but indeterminate. Attention on follow-up recommended. 3. The calcified upper abdominal and groin lymph nodes are stable as are the scattered calcified and noncalcified peritoneal implants. 4.  Stable midline ventral hernia containing a short segment of colon without complicating features. 5. Aortic Atherosclerosis (ICD10-I70.0).     10/04/2019 - 09/21/2021 Chemotherapy   Patient is on Treatment Plan : ovarian Bevacizumab q21d     10/04/2019 Tumor Marker   Patient's tumor was tested for the following markers: CA-125. Results of the tumor marker test revealed 12.9.   11/01/2019 Tumor Marker   Patient's tumor was tested for the following markers: CA-125 Results of the tumor marker test revealed 15.4   12/13/2019 Tumor Marker   Patient's tumor was tested for the following markers: CA-125 Results of the tumor marker test revealed 14.8   12/31/2019 Imaging   1. Unchanged tiny peritoneal nodule adjacent to the spleen measuring 6 mm. Other previously noted peritoneal nodules are imperceptible on present examination. 2. Unchanged prominent, partially calcified portacaval lymph node and bilateral inguinal lymph nodes. 3. Unchanged subtle, nonspecific subcapsular lesion of the spleen.  4. No evidence of new metastatic disease in the abdomen or pelvis. 5. Status post hysterectomy. 6.  Unchanged low midline ventral abdominal hernia containing a single loop of nonobstructed transverse colon. 7. Cholelithiasis. 8. Aortic Atherosclerosis (ICD10-I70.0).       01/03/2020 Tumor Marker   Patient's tumor was tested for the following markers: CA-125 Results of the tumor marker test revealed 11.7.   01/24/2020 Tumor Marker   Patient's tumor was tested for the following markers: CA-125. Results of the tumor marker test revealed 15.1   03/06/2020 Tumor Marker   Patient's tumor was tested for the following markers: CA-125. Results of the tumor marker test revealed 20.3   03/27/2020 Tumor Marker   Patient's tumor was tested for the following markers: CA-125 Results of the tumor marker test revealed 23.7   05/11/2020 Tumor Marker   Patient's tumor was tested for the following markers:  CA-125. Results of the tumor marker test revealed 25.4   06/23/2020 Imaging   1. Cholelithiasis with gallbladder wall thickening and mild infiltrative edema in the porta hepatis, raising the possibility of acute cholecystitis. There is truncation of the common bile duct distally and distal choledocholithiasis is difficult to exclude. Correlate with bilirubin levels. If clinically warranted, MRCP could be utilized for further characterization. 2. Stable tiny perisplenic nodule. 3. Other imaging findings of potential clinical significance: Small type 1 hiatal hernia. Prominent stool throughout the colon favors constipation. Ventral infraumbilical hernia contains transverse colon without findings of strangulation or obstruction. Small supraumbilical hernia contains adipose tissue. Lumbar degenerative disc disease at L5-S1. Stable tiny nodule along the lateral margin of the spleen, nonspecific. 4. Aortic atherosclerosis.     12/15/2020 Imaging   No evidence of recurrent or metastatic carcinoma within the abdomen or pelvis.   Stable ventral hernia containing transverse colon. No evidence of bowel obstruction or strangulation.   Cholelithiasis. No radiographic evidence of cholecystitis.   Tiny hiatal hernia.   Aortic Atherosclerosis (ICD10-I70.0).     06/11/2021 Imaging   1. Numerous sub 4 mm pulmonary nodules in the lungs some of which have a cavitary appearance. I think it is unlikely this is metastatic disease and more likely a possible immunotherapy related process such as a sarcoid like reaction. Recommend full chest CT further evaluation. 2. Stable small calcified retroperitoneal and mesenteric lymph nodes. No findings suspicious for recurrent peritoneal carcinomatosis. 3. Stable lower abdominal wall hernia containing part of the transverse colon. 4. Cholelithiasis.   09/27/2021 Imaging   1. Numerous small pulmonary nodules throughout the lungs, the majority of which are cavitary. These are  all subcentimeter, however nodules that were seen in the bilateral lung bases on prior CT dated 06/08/2021 appear slightly increased in size and number. Behavior is highly suspicious for pulmonary metastatic disease despite the unusual appearance, however as previously reported general differential considerations include atypical infection and inflammation, such as drug-induced sarcoidosis like reaction.  2. Unchanged calcified lymph nodes in the abdomen and pelvis, consistent with treated nodal metastases. 3. Cholelithiasis with wall thickening and pericholecystic fluid. This appearance is similar to prior examination and suggests chronic cholecystitis. Correlate for clinical symptoms. Mild intrahepatic biliary ductal dilatation, similar to prior examination. 4. Midline ventral hernia containing a single nonobstructed loop of mid transverse colon.     12/25/2021 Imaging   1. Again noted are multiple thin walled cavitary lung nodules throughout both lungs. These are similar in size and multiplicity when compared with the exam from 09/26/2021. Etiology remains indeterminate, although metastatic disease cannot be excluded with a high degree of certainty. 2. Stable calcified upper abdominal lymph nodes consistent with treated nodal  metastases. 3. Gallstones. 4. Aortic Atherosclerosis (ICD10-I70.0).     PHYSICAL EXAMINATION: ECOG PERFORMANCE STATUS: 1 - Symptomatic but completely ambulatory  Vitals:   12/27/21 0819  BP: (!) 132/57  Pulse: 60  Resp: 18  Temp: 97.8 F (36.6 C)  SpO2: 98%   Filed Weights   12/27/21 0819  Weight: 153 lb (69.4 kg)    GENERAL:alert, no distress and comfortable SKIN: She has mild diffuse skin rash  NEURO: alert & oriented x 3 with fluent speech, no focal motor/sensory deficits  LABORATORY DATA:  I have reviewed the data as listed    Component Value Date/Time   NA 137 12/25/2021 0745   NA 139 04/14/2017 0742   K 4.6 12/25/2021 0745   K 4.0 04/14/2017  0742   CL 106 12/25/2021 0745   CO2 25 12/25/2021 0745   CO2 21 (L) 04/14/2017 0742   GLUCOSE 98 12/25/2021 0745   GLUCOSE 247 (H) 04/14/2017 0742   BUN 27 (H) 12/25/2021 0745   BUN 13.1 04/14/2017 0742   CREATININE 1.38 (H) 12/25/2021 0745   CREATININE 0.99 03/12/2021 0832   CREATININE 0.9 04/14/2017 0742   CALCIUM 8.9 12/25/2021 0745   CALCIUM 9.2 04/14/2017 0742   PROT 5.8 (L) 12/25/2021 0745   PROT 7.3 04/14/2017 0742   ALBUMIN 3.5 12/25/2021 0745   ALBUMIN 3.9 04/14/2017 0742   AST 13 (L) 12/25/2021 0745   AST 18 03/12/2021 0832   AST 17 04/14/2017 0742   ALT 5 12/25/2021 0745   ALT 9 03/12/2021 0832   ALT 14 04/14/2017 0742   ALKPHOS 50 12/25/2021 0745   ALKPHOS 70 04/14/2017 0742   BILITOT 0.5 12/25/2021 0745   BILITOT 0.8 03/12/2021 0832   BILITOT 0.37 04/14/2017 0742   GFRNONAA 42 (L) 12/25/2021 0745   GFRNONAA >60 03/12/2021 0832   GFRAA >60 01/24/2020 0924   GFRAA >60 07/19/2019 0951    No results found for: "SPEP", "UPEP"  Lab Results  Component Value Date   WBC 5.1 12/25/2021   NEUTROABS 3.2 12/25/2021   HGB 11.5 (L) 12/25/2021   HCT 34.7 (L) 12/25/2021   MCV 92.5 12/25/2021   PLT 161 12/25/2021      Chemistry      Component Value Date/Time   NA 137 12/25/2021 0745   NA 139 04/14/2017 0742   K 4.6 12/25/2021 0745   K 4.0 04/14/2017 0742   CL 106 12/25/2021 0745   CO2 25 12/25/2021 0745   CO2 21 (L) 04/14/2017 0742   BUN 27 (H) 12/25/2021 0745   BUN 13.1 04/14/2017 0742   CREATININE 1.38 (H) 12/25/2021 0745   CREATININE 0.99 03/12/2021 0832   CREATININE 0.9 04/14/2017 0742      Component Value Date/Time   CALCIUM 8.9 12/25/2021 0745   CALCIUM 9.2 04/14/2017 0742   ALKPHOS 50 12/25/2021 0745   ALKPHOS 70 04/14/2017 0742   AST 13 (L) 12/25/2021 0745   AST 18 03/12/2021 0832   AST 17 04/14/2017 0742   ALT 5 12/25/2021 0745   ALT 9 03/12/2021 0832   ALT 14 04/14/2017 0742   BILITOT 0.5 12/25/2021 0745   BILITOT 0.8 03/12/2021 0832    BILITOT 0.37 04/14/2017 0742       RADIOGRAPHIC STUDIES: I have reviewed multiple imaging studies with the patient and her husband I have personally reviewed the radiological images as listed and agreed with the findings in the report.

## 2021-12-27 NOTE — Assessment & Plan Note (Signed)
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

## 2021-12-27 NOTE — Assessment & Plan Note (Signed)
She has better blood pressure control now She will continue current antihypertensives The patient is instructed to continue checking her blood pressure twice daily and to call me next week for update on her blood pressure control

## 2021-12-28 ENCOUNTER — Other Ambulatory Visit: Payer: Self-pay | Admitting: Hematology and Oncology

## 2022-01-10 ENCOUNTER — Encounter: Payer: Self-pay | Admitting: Hematology and Oncology

## 2022-01-10 ENCOUNTER — Telehealth: Payer: Self-pay

## 2022-01-10 ENCOUNTER — Other Ambulatory Visit: Payer: Self-pay | Admitting: Hematology and Oncology

## 2022-01-10 ENCOUNTER — Inpatient Hospital Stay (HOSPITAL_BASED_OUTPATIENT_CLINIC_OR_DEPARTMENT_OTHER): Payer: Medicare Other | Admitting: Hematology and Oncology

## 2022-01-10 ENCOUNTER — Inpatient Hospital Stay: Payer: Medicare Other

## 2022-01-10 VITALS — BP 158/52 | HR 54 | Resp 18 | Ht 65.0 in | Wt 154.4 lb

## 2022-01-10 DIAGNOSIS — C5701 Malignant neoplasm of right fallopian tube: Secondary | ICD-10-CM

## 2022-01-10 DIAGNOSIS — I10 Essential (primary) hypertension: Secondary | ICD-10-CM | POA: Diagnosis not present

## 2022-01-10 DIAGNOSIS — K8020 Calculus of gallbladder without cholecystitis without obstruction: Secondary | ICD-10-CM

## 2022-01-10 LAB — CBC WITH DIFFERENTIAL/PLATELET
Abs Immature Granulocytes: 0.01 10*3/uL (ref 0.00–0.07)
Basophils Absolute: 0.1 10*3/uL (ref 0.0–0.1)
Basophils Relative: 1 %
Eosinophils Absolute: 0.4 10*3/uL (ref 0.0–0.5)
Eosinophils Relative: 7 %
HCT: 37.6 % (ref 36.0–46.0)
Hemoglobin: 12.2 g/dL (ref 12.0–15.0)
Immature Granulocytes: 0 %
Lymphocytes Relative: 18 %
Lymphs Abs: 1 10*3/uL (ref 0.7–4.0)
MCH: 30.5 pg (ref 26.0–34.0)
MCHC: 32.4 g/dL (ref 30.0–36.0)
MCV: 94 fL (ref 80.0–100.0)
Monocytes Absolute: 0.5 10*3/uL (ref 0.1–1.0)
Monocytes Relative: 10 %
Neutro Abs: 3.5 10*3/uL (ref 1.7–7.7)
Neutrophils Relative %: 64 %
Platelets: 175 10*3/uL (ref 150–400)
RBC: 4 MIL/uL (ref 3.87–5.11)
RDW: 14.7 % (ref 11.5–15.5)
WBC: 5.5 10*3/uL (ref 4.0–10.5)
nRBC: 0 % (ref 0.0–0.2)

## 2022-01-10 LAB — COMPREHENSIVE METABOLIC PANEL
ALT: 5 U/L (ref 0–44)
AST: 13 U/L — ABNORMAL LOW (ref 15–41)
Albumin: 3.6 g/dL (ref 3.5–5.0)
Alkaline Phosphatase: 52 U/L (ref 38–126)
Anion gap: 4 — ABNORMAL LOW (ref 5–15)
BUN: 20 mg/dL (ref 8–23)
CO2: 27 mmol/L (ref 22–32)
Calcium: 8.9 mg/dL (ref 8.9–10.3)
Chloride: 106 mmol/L (ref 98–111)
Creatinine, Ser: 1.33 mg/dL — ABNORMAL HIGH (ref 0.44–1.00)
GFR, Estimated: 44 mL/min — ABNORMAL LOW (ref >60)
Glucose, Bld: 126 mg/dL — ABNORMAL HIGH (ref 70–99)
Potassium: 5.2 mmol/L — ABNORMAL HIGH (ref 3.5–5.1)
Sodium: 137 mmol/L (ref 135–145)
Total Bilirubin: 0.5 mg/dL (ref 0.3–1.2)
Total Protein: 5.9 g/dL — ABNORMAL LOW (ref 6.5–8.1)

## 2022-01-10 LAB — TOTAL PROTEIN, URINE DIPSTICK: Protein, ur: 30 mg/dL — AB

## 2022-01-10 NOTE — Assessment & Plan Note (Signed)
I do not believe her symptom is related to her underlying gynecological malignancy She will have repeat imaging study again in a few months as discussed

## 2022-01-10 NOTE — Telephone Encounter (Signed)
-----   Message from Heath Lark, MD sent at 01/10/2022 12:19 PM EDT ----- Please tell her not to send mychart msg related to problems like this To evaluate for cholecystitis, she would need repeat labs and [place] I can see her later today if she can make it with labs

## 2022-01-10 NOTE — Assessment & Plan Note (Signed)
Her blood pressure is high today but her blood pressure monitoring from home were within normal range Urinalysis show mild proteinuria Observe only for now

## 2022-01-10 NOTE — Progress Notes (Signed)
[facility] PROGRESS NOTE  Patient Care Team: Gaynelle Arabian, MD as PCP - General (Family Medicine) Heath Lark, MD as Consulting Physician (Hematology and Oncology) Everitt Amber, MD as Consulting Physician (Obstetrics and Gynecology)  ASSESSMENT & PLAN:  Adenocarcinoma of right fallopian tube [facility]) I do not believe her symptom is related to her underlying gynecological malignancy She will have repeat imaging study again in a few months as discussed  Cholelithiasis I have reviewed prior CT imaging with the patient and her husband Her blood work and examination is benign It is possible she might have intermittent right upper quadrant discomfort due to her gallbladder disease We discussed potential imaging study and dietary modification versus surgery For now, she is comfortable to be observed and agree for dietary changes If she have recurrent symptoms that are worse, I will refer her to general surgery to consider cholecystectomy She is in agreement with the plan of care  Essential hypertension Her blood pressure is high today but her blood pressure monitoring from home were within normal range Urinalysis show mild proteinuria Observe only for now  No orders of the defined types were placed in this encounter.   All questions were answered. The patient knows to call the clinic with any problems, questions or concerns. The total time spent in the appointment was 30 minutes encounter with patients including review of chart and various tests results, discussions about plan of care and coordination of care plan   Heath Lark, MD 01/10/2022 3:20 PM  INTERVAL HISTORY: Please see below for problem oriented charting. she returns for urgent evaluation with her husband Over the past 2 weeks, she has been complaining of intermittent right upper quadrant discomfort and occasional nausea Denies recent fever or chills She is being evaluated today to address this  issue Otherwise, she have no other concerns  REVIEW OF SYSTEMS:   Constitutional: Denies fevers, chills or abnormal weight loss Eyes: Denies blurriness of vision Ears, nose, mouth, throat, and face: Denies mucositis or sore throat Respiratory: Denies cough, dyspnea or wheezes Cardiovascular: Denies palpitation, chest discomfort or lower extremity swelling Skin: Denies abnormal skin rashes Lymphatics: Denies new lymphadenopathy or easy bruising Neurological:Denies numbness, tingling or new weaknesses Behavioral/Psych: Mood is stable, no new changes  All other systems were reviewed with the patient and are negative.  I have reviewed the past medical history, past surgical history, social history and family history with the patient and they are unchanged from previous note.  ALLERGIES:  is allergic to dilaudid [hydromorphone hcl], morphine and related, and sudafed [pseudoephedrine hcl].  MEDICATIONS:  Current Outpatient Medications  Medication Sig Dispense Refill   acetaminophen (TYLENOL) 325 MG tablet Take 650 mg by mouth every 6 (six) hours as needed for headache (pain).     atenolol (TENORMIN) 25 MG tablet TAKE 1 TABLET BY MOUTH EVERY DAY 30 tablet 2   cloNIDine (CATAPRES) 0.1 MG tablet TAKE 1 TABLET BY MOUTH EVERY DAY 30 tablet 1   doxazosin (CARDURA) 4 MG tablet TAKE 1 TABLET BY MOUTH EVERY DAY 30 tablet 1   hydrochlorothiazide (HYDRODIURIL) 25 MG tablet TAKE 1 TABLET (25 MG TOTAL) BY MOUTH DAILY. 30 tablet 3   lidocaine-prilocaine (EMLA) cream Apply to affected area once daily as directed. (Patient taking differently: 1 application. as needed (access port). Apply to affected area once daily as directed.) 30 g 3   lisinopril (ZESTRIL) 20 MG tablet TAKE 1 TABLET BY MOUTH EVERY DAY IN THE EVENING 60 tablet 0  venlafaxine XR (EFFEXOR-XR) 37.5 MG 24 hr capsule TAKE 1 CAPSULE BY MOUTH DAILY WITH BREAKFAST 90 capsule 3   No current facility-administered medications for this visit.    Facility-Administered Medications Ordered in Other Visits  Medication Dose Route Frequency Provider Last Rate Last Admin   sodium chloride flush (NS) 0.9 % injection 10 mL  10 mL Intravenous PRN Alvy Bimler, Jerardo Costabile, MD   10 mL at 02/11/17 0839   sodium chloride flush (NS) 0.9 % injection 10 mL  10 mL Intravenous PRN Alvy Bimler, Calloway Andrus, MD   10 mL at 03/04/17 1510    SUMMARY OF ONCOLOGIC HISTORY: Oncology History Overview Note  Neg genetics   Adenocarcinoma of right fallopian tube ([place])  08/12/2016 Initial Diagnosis   The patient has many months of vague symptoms of fullness in the pelvis, urinary frequency and difficulty with defecation. She denies vaginal bleeding or rectal bleeding.     08/12/2016 Imaging   Abdomen X-ray in the ER: Moderate colonic stool burden without evidence of enteric obstruction   11/13/2016 Imaging   TVUS was performed on 11/13/16 which showed a large hypoechoic lobular mass seen posterior to the uterus measuring 18.2x16.1x11.8cm, blood flow was seen along the periphery of the mass. It was considered to be likely to be a fibroid vs pelvic mass vs ovarian mass. Neither ovary was removed. Moderate amount of free fluid in the pelvis. THe uterus measures 4x10x4cm. The endometrium is 23m.    12/05/2016 Imaging   MR pelvis 1. Mild limitations as detailed above. 2. Heterogeneous pelvic mass is favored to arise from the right ovary. Favor a solid ovarian neoplasm such as fibroma/Brenner's tumor. Given lesion size and heterogeneous T2 signal out, complicating torsion cannot be excluded. 3. Moderate abdominopelvic ascites, without specific evidence of peritoneal metastasis. Consider further evaluation with contrast-enhanced abdominopelvic CT.    12/26/2016 Pathology Results   1. Uterus, ovaries and fallopian tubes - ADENOCARCINOMA INVOLVING RIGHT FALLOPIAN TUBE, RIGHT AND LEFT OVARIES, UTERINE SEROSA AND PELVIC MASS. - SEE ONCOLOGY TABLE AND COMMENT. - UTERINE CERVIX, ENDOMETRIUM,  MYOMETRIUM AND LEFT FALLOPIAN TUBE FREE OF TUMOR. 2. Omentum, resection for tumor - ADENOCARCINOMA. Microscopic Comment 1. ONCOLOGY TABLE - FALLOPIAN TUBE. SEE COMMENT 1. Specimen, including laterality: Uterus, bilateral adnexa, pelvic mass and omentum. 2. Procedure: Hysterectomy with bilateral salpingo-oophorectomy, pelvic mass excision and omentectomy. 3. Lymph node sampling performed: No 4. Tumor site: See comment. 5. Tumor location in fallopian tube: Right fallopian tube fimbria 6. Specimen integrity (intact/ruptured/disrupted): Intact 7. Tumor size (cm): 18 cm, see comment. 8. Histologic type: Adenocarcinoma 9. Grade: High grade 10. Microscopic tumor extension: Tumor involves right fallopian tube, right and left ovaries, uterine serosa, pelvic mass and omentum. 11. Margins: See comment. 12. Lymph-Vascular invasion: Present 13. Lymph nodes: # examined: 0; # positive: N/A 14. TNM: pT3c, pNX 15. FIGO Stage (based on pathologic findings, needs clinical correlation: III-C 16. Comments: There is an 18 cm pelvic mass which is a high grade adenocarcinoma and there is a 12.2 cm  segment of omentum which is extensively involved with adenocarcinoma. The tumor also involves the fimbria of the right fallopian tube and the parenchyma of the right and left ovaries as well as uterine serosa. The fimbria of the right fallopian has intraepithelial atypia consistent with a precursor lesion and therefore this is most consistent with primary fallopian tube adenocarcinoma. A primary peritoneal serous carcinoma is a less likely possibility.    12/26/2016 Pathology Results   PERITONEAL/ASCITIC FLUID(SPECIMEN 1 OF 1 COLLECTED 12/26/16): POORLY DIFFERENTIATED  ADENOCARCINOMA   12/26/2016 Surgery   Procedure(s) Performed: Exploratory laparotomy with total abdominal hysterectomy, bilateral salpingo-oophorectomy, omentectomy radical tumor debulking for ovarian cancer .   Surgeon: Thereasa Solo, MD.      Operative  Findings: 20cm left ovarian mass densely adherent to the posterior uterus, right tube and ovary, cervix, sigmoid colon. 10cm omental cake. 4L ascites. 60m size tumor nodules on the serosa of the terminal ileum and proximal sigmoid colon. 154mnodules on right diaphragm.    This represented an optimal cytoreduction (R1) with 59m54modules on intestine and diaphragm representing gross visible disease   01/16/2017 Procedure   Placement of single lumen port a cath via right internal jugular vein. The catheter tip lies at the cavo-atrial junction. A power injectable port a cath was placed and is ready for immediate use.   01/17/2017 Imaging   1. 3.5 x 7.2 x 4.6 cm fluid collection along the vaginal cuff, posterior the bladder and extending into the right adnexal space. This lesion demonstrates rim enhancement. Imaging features could be related to a loculated postoperative seroma or hematoma. Superinfection cannot be excluded by CT. Given debulking surgery was 3 weeks ago, this entire structure is un likely to represent neoplasm, but peritoneal involvement could have this appearance. 2. Small fluid collection in the left para colic gutter without rim enhancement. 3. Irregular/nodular appearance of the peritoneal M in the anatomic pelvis with areas of subtle nodularity between the stomach and the spleen. Close attention in these regions on follow-up recommended as metastatic disease is a concern. 4. Mildly enlarged hepatoduodenal ligament lymph node. Attention on follow-up recommended.   01/20/2017 Tumor Marker   Patient's tumor was tested for the following markers: CA-125 Results of the tumor marker test revealed 46.6   01/28/2017 Genetic Testing       Negative genetic testing on the [facility].  The [facility] panel offered by [employer] sequencing and deletion/duplication testing of the following 28 genes: APC, ATM, BARD1, BMPR1A, BRCA1, BRCA2, BRIP1, CHD1, CDK4, CDKN2A,  CHEK2, EPCAM (large rearrangement only), MLH1, MSH2, MSH6, MUTYH, NBN, PALB2, PMS2, PTEN, RAD51C, RAD51D, SMAD4, STK11, and TP53. Sequencing was performed for select regions of POLE and POLD1, and large rearrangement analysis was performed for select regions of GREM1. The report date is January 28, 2017.  HRD testing looking for genomic instability and BRCA mutations was negative.  The report date of this test is January 27, 2017.    01/31/2017 Tumor Marker   Patient's tumor was tested for the following markers: CA-125 Results of the tumor marker test revealed 22.4   03/04/2017 Tumor Marker   Patient's tumor was tested for the following markers: CA-125 Results of the tumor marker test revealed 13.8   04/18/2017 Tumor Marker   Patient's tumor was tested for the following markers: CA-125 Results of the tumor marker test revealed 11.9   05/05/2017 Tumor Marker   Patient's tumor was tested for the following markers: CA-125 Results of the tumor marker test revealed 12   05/26/2017 Tumor Marker   Patient's tumor was tested for the following markers: CA-125 Results of the tumor marker test revealed 10.6   05/26/2017 Imaging   1. A previously noted postoperative fluid collection in the low pelvis and residual ascites seen on the prior examination has resolved on today's study. No findings to suggest residual/recurrent disease on today's examination. No definite solid organ metastasis identified in the abdomen or pelvis. No lymphadenopathy. 2. Aortic atherosclerosis.   06/10/2017  Procedure   Successful right IJ vein Port-A-Cath explant.   03/09/2018 Tumor Marker   Patient's tumor was tested for the following markers: CA-125 Results of the tumor marker test revealed 12.1   05/26/2018 Tumor Marker   Patient's tumor was tested for the following markers: CA-125 Results of the tumor marker test revealed 13.8   09/09/2018 Tumor Marker   Patient's tumor was tested for the following markers:  CA-125 Results of the tumor marker test revealed 23.9   12/18/2018 Tumor Marker   Patient's tumor was tested for the following markers: CA-125 Results of the tumor marker test revealed 43.8   02/27/2019 - 02/28/2019 Hospital Admission   She was admitted for bowel obstruction   02/27/2019 Imaging   1. High-grade small bowel obstruction with transition point in the pelvis in an area of suspected desmoplastic reaction surrounding a 1.3 cm spiculated nodule in the mesentery, suspicious for carcinoid. There is hyperenhancement near the tip of the appendix and loss of the normal fat plane between it and the adjacent sigmoid colon, potentially the site of primary lesion. 2. Multiple new small subcentimeter hyperdense lesions scattered along the liver capsule, concerning for metastases. Enlarged hyperenhancing gastrohepatic and portacaval lymph nodes concerning for nodal metastases. 3. Very mild right hydroureteronephrosis to the level of the desmoplastic reaction in the pelvis, potentially involving the right ureter. 4. Infraumbilical ventral abdominal wall diastasis containing a small nondilated portion of transverse colon. 5. Trace ascites. 6. Cholelithiasis. 7.  Aortic atherosclerosis (ICD10-I70.0).     03/05/2019 Tumor Marker   Patient's tumor was tested for the following markers: CA-125 Results of the tumor marker test revealed 70.1   03/12/2019 Echocardiogram   IMPRESSIONS     1. Left ventricular ejection fraction, by visual estimation, is 60 to 65%. The left ventricle has normal function. There is no left ventricular hypertrophy.  2. Left ventricular diastolic parameters are consistent with Grade I diastolic dysfunction (impaired relaxation).  3. Global right ventricle has normal systolic function.The right ventricular size is normal. No increase in right ventricular wall thickness.  4. Left atrial size was normal.  5. Right atrial size was normal.  6. The pericardium was not assessed.   7. The mitral valve is normal in structure. No evidence of mitral valve regurgitation.  8. The tricuspid valve is normal in structure. Tricuspid valve regurgitation is trivial.  9. The aortic valve is normal in structure. Aortic valve regurgitation is not visualized. 10. The pulmonic valve was grossly normal. Pulmonic valve regurgitation is not visualized. 11. The average left ventricular global longitudinal strain is -17.6 %.   03/16/2019 Procedure   Successful placement of a right internal jugular approach power injectable Port-A-Cath. The catheter is ready for immediate use.     04/19/2019 Tumor Marker   Patient's tumor was tested for the following markers: CA-125 Results of the tumor marker test revealed 39.8   05/17/2019 Tumor Marker   Patient's tumor was tested for the following markers: CA-125 Results of the tumor marker test revealed 27   05/28/2019 Tumor Marker   Patient's tumor was tested for the following markers: CA-125 Results of the tumor marker test revealed 27.7.   06/11/2019 Imaging   1. No new or progressive metastatic disease in the abdomen or pelvis. 2. Tiny noncalcified perisplenic implant is decreased. Calcified pericardiophrenic, retroperitoneal and bilateral inguinal lymph nodes and scattered small calcified perihepatic and perisplenic implants are stable. 3.  Aortic Atherosclerosis (ICD10-I70.0).       06/17/2019 Echocardiogram  1. Left ventricular ejection fraction, by estimation, is 65 to 70%. The left ventricle has normal function. The left ventricle has no regional wall motion abnormalities. Left ventricular diastolic parameters were normal.  2. Right ventricular systolic function is normal. The right ventricular size is normal.  3. The mitral valve is normal in structure and function. No evidence of mitral valve regurgitation. No evidence of mitral stenosis.  4. The aortic valve is normal in structure and function. Aortic valve regurgitation is not  visualized. No aortic stenosis is present.   06/21/2019 Tumor Marker   Patient's tumor was tested for the following markers: CA-125 Results of the tumor marker test revealed 18.7   07/19/2019 Tumor Marker   Patient's tumor was tested for the following markers: CA-125 Results of the tumor marker test revealed 16.7   08/30/2019 Tumor Marker   Patient's tumor was tested for the following markers: CA-125. Results of the tumor marker test revealed 13.9   09/15/2019 Echocardiogram    1. Left ventricular ejection fraction, by estimation, is 65 to 70%. The left ventricle has normal function. The left ventricle has no regional wall motion abnormalities. Left ventricular diastolic parameters are consistent with Grade I diastolic dysfunction (impaired relaxation). The average left ventricular global longitudinal strain is 18.1 %. The global longitudinal strain is normal.  2. Right ventricular systolic function is normal. The right ventricular size is normal.  3. The mitral valve is normal in structure. No evidence of mitral valve regurgitation. No evidence of mitral stenosis.  4. The aortic valve is normal in structure. Aortic valve regurgitation is not visualized. No aortic stenosis is present.  5. The inferior vena cava is normal in size with greater than 50% respiratory variability, suggesting right atrial pressure of 3 mmHg.     09/24/2019 Imaging   1. Stable exam. No new or progressive metastatic disease on today's study. 2. Small subcapsular hypodensity in the lateral spleen, new in the interval, but indeterminate. Attention on follow-up recommended. 3. The calcified upper abdominal and groin lymph nodes are stable as are the scattered calcified and noncalcified peritoneal implants. 4. Stable midline ventral hernia containing a short segment of colon without complicating features. 5. Aortic Atherosclerosis (ICD10-I70.0).     10/04/2019 - 09/21/2021 Chemotherapy   Patient is on Treatment Plan :  ovarian Bevacizumab q21d     10/04/2019 Tumor Marker   Patient's tumor was tested for the following markers: CA-125. Results of the tumor marker test revealed 12.9.   11/01/2019 Tumor Marker   Patient's tumor was tested for the following markers: CA-125 Results of the tumor marker test revealed 15.4   12/13/2019 Tumor Marker   Patient's tumor was tested for the following markers: CA-125 Results of the tumor marker test revealed 14.8   12/31/2019 Imaging   1. Unchanged tiny peritoneal nodule adjacent to the spleen measuring 6 mm. Other previously noted peritoneal nodules are imperceptible on present examination. 2. Unchanged prominent, partially calcified portacaval lymph node and bilateral inguinal lymph nodes. 3. Unchanged subtle, nonspecific subcapsular lesion of the spleen.  4. No evidence of new metastatic disease in the abdomen or pelvis. 5. Status post hysterectomy. 6. Unchanged low midline ventral abdominal hernia containing a single loop of nonobstructed transverse colon. 7. Cholelithiasis. 8. Aortic Atherosclerosis (ICD10-I70.0).       01/03/2020 Tumor Marker   Patient's tumor was tested for the following markers: CA-125 Results of the tumor marker test revealed 11.7.   01/24/2020 Tumor Marker   Patient's tumor was tested for  the following markers: CA-125. Results of the tumor marker test revealed 15.1   03/06/2020 Tumor Marker   Patient's tumor was tested for the following markers: CA-125. Results of the tumor marker test revealed 20.3   03/27/2020 Tumor Marker   Patient's tumor was tested for the following markers: CA-125 Results of the tumor marker test revealed 23.7   05/11/2020 Tumor Marker   Patient's tumor was tested for the following markers: CA-125. Results of the tumor marker test revealed 25.4   06/23/2020 Imaging   1. Cholelithiasis with gallbladder wall thickening and mild infiltrative edema in the porta hepatis, raising the possibility of acute  cholecystitis. There is truncation of the common bile duct distally and distal choledocholithiasis is difficult to exclude. Correlate with bilirubin levels. If clinically warranted, MRCP could be utilized for further characterization. 2. Stable tiny perisplenic nodule. 3. Other imaging findings of potential clinical significance: Small type 1 hiatal hernia. Prominent stool throughout the colon favors constipation. Ventral infraumbilical hernia contains transverse colon without findings of strangulation or obstruction. Small supraumbilical hernia contains adipose tissue. Lumbar degenerative disc disease at L5-S1. Stable tiny nodule along the lateral margin of the spleen, nonspecific. 4. Aortic atherosclerosis.     12/15/2020 Imaging   No evidence of recurrent or metastatic carcinoma within the abdomen or pelvis.   Stable ventral hernia containing transverse colon. No evidence of bowel obstruction or strangulation.   Cholelithiasis. No radiographic evidence of cholecystitis.   Tiny hiatal hernia.   Aortic Atherosclerosis (ICD10-I70.0).     06/11/2021 Imaging   1. Numerous sub 4 mm pulmonary nodules in the lungs some of which have a cavitary appearance. I think it is unlikely this is metastatic disease and more likely a possible immunotherapy related process such as a sarcoid like reaction. Recommend full chest CT further evaluation. 2. Stable small calcified retroperitoneal and mesenteric lymph nodes. No findings suspicious for recurrent peritoneal carcinomatosis. 3. Stable lower abdominal wall hernia containing part of the transverse colon. 4. Cholelithiasis.   09/27/2021 Imaging   1. Numerous small pulmonary nodules throughout the lungs, the majority of which are cavitary. These are all subcentimeter, however nodules that were seen in the bilateral lung bases on prior CT dated 06/08/2021 appear slightly increased in size and number. Behavior is highly suspicious for pulmonary metastatic disease  despite the unusual appearance, however as previously reported general differential considerations include atypical infection and inflammation, such as drug-induced sarcoidosis like reaction.  2. Unchanged calcified lymph nodes in the abdomen and pelvis, consistent with treated nodal metastases. 3. Cholelithiasis with wall thickening and pericholecystic fluid. This appearance is similar to prior examination and suggests chronic cholecystitis. Correlate for clinical symptoms. Mild intrahepatic biliary ductal dilatation, similar to prior examination. 4. Midline ventral hernia containing a single nonobstructed loop of mid transverse colon.     12/25/2021 Imaging   1. Again noted are multiple thin walled cavitary lung nodules throughout both lungs. These are similar in size and multiplicity when compared with the exam from 09/26/2021. Etiology remains indeterminate, although metastatic disease cannot be excluded with a high degree of certainty. 2. Stable calcified upper abdominal lymph nodes consistent with treated nodal metastases. 3. Gallstones. 4. Aortic Atherosclerosis (ICD10-I70.0).     PHYSICAL EXAMINATION: ECOG PERFORMANCE STATUS: 1 - Symptomatic but completely ambulatory  Vitals:   01/10/22 1434  BP: (!) 158/52  Pulse: (!) 54  Resp: 18  SpO2: 100%   Filed Weights   01/10/22 1434  Weight: 154 lb 6.4 oz (70 kg)  GENERAL:alert, no distress and comfortable SKIN: skin color, texture, turgor are normal, no rashes or significant lesions EYES: normal, Conjunctiva are pink and non-injected, sclera clear OROPHARYNX:no exudate, no erythema and lips, buccal mucosa, and tongue normal  NECK: supple, thyroid normal size, non-tender, without nodularity LYMPH:  no palpable lymphadenopathy in the cervical, axillary or inguinal LUNGS: clear to auscultation and percussion with normal breathing effort HEART: regular rate & rhythm and no murmurs and no lower extremity edema ABDOMEN:abdomen soft,  mild discomfort with very deep palpation on the right upper quadrant without rebound or guarding Musculoskeletal:no cyanosis of digits and no clubbing  NEURO: alert & oriented x 3 with fluent speech, no focal motor/sensory deficits  LABORATORY DATA:  I have reviewed the data as listed    Component Value Date/Time   NA 137 01/10/2022 1408   NA 139 04/14/2017 0742   K 5.2 (H) 01/10/2022 1408   K 4.0 04/14/2017 0742   CL 106 01/10/2022 1408   CO2 27 01/10/2022 1408   CO2 21 (L) 04/14/2017 0742   GLUCOSE 126 (H) 01/10/2022 1408   GLUCOSE 247 (H) 04/14/2017 0742   BUN 20 01/10/2022 1408   BUN 13.1 04/14/2017 0742   CREATININE 1.33 (H) 01/10/2022 1408   CREATININE 0.99 03/12/2021 0832   CREATININE 0.9 04/14/2017 0742   CALCIUM 8.9 01/10/2022 1408   CALCIUM 9.2 04/14/2017 0742   PROT 5.9 (L) 01/10/2022 1408   PROT 7.3 04/14/2017 0742   ALBUMIN 3.6 01/10/2022 1408   ALBUMIN 3.9 04/14/2017 0742   AST 13 (L) 01/10/2022 1408   AST 18 03/12/2021 0832   AST 17 04/14/2017 0742   ALT 5 01/10/2022 1408   ALT 9 03/12/2021 0832   ALT 14 04/14/2017 0742   ALKPHOS 52 01/10/2022 1408   ALKPHOS 70 04/14/2017 0742   BILITOT 0.5 01/10/2022 1408   BILITOT 0.8 03/12/2021 0832   BILITOT 0.37 04/14/2017 0742   GFRNONAA 44 (L) 01/10/2022 1408   GFRNONAA >60 03/12/2021 0832   GFRAA >60 01/24/2020 0924   GFRAA >60 07/19/2019 0951    No results found for: "SPEP", "UPEP"  Lab Results  Component Value Date   WBC 5.5 01/10/2022   NEUTROABS 3.5 01/10/2022   HGB 12.2 01/10/2022   HCT 37.6 01/10/2022   MCV 94.0 01/10/2022   PLT 175 01/10/2022      Chemistry      Component Value Date/Time   NA 137 01/10/2022 1408   NA 139 04/14/2017 0742   K 5.2 (H) 01/10/2022 1408   K 4.0 04/14/2017 0742   CL 106 01/10/2022 1408   CO2 27 01/10/2022 1408   CO2 21 (L) 04/14/2017 0742   BUN 20 01/10/2022 1408   BUN 13.1 04/14/2017 0742   CREATININE 1.33 (H) 01/10/2022 1408   CREATININE 0.99 03/12/2021  0832   CREATININE 0.9 04/14/2017 0742      Component Value Date/Time   CALCIUM 8.9 01/10/2022 1408   CALCIUM 9.2 04/14/2017 0742   ALKPHOS 52 01/10/2022 1408   ALKPHOS 70 04/14/2017 0742   AST 13 (L) 01/10/2022 1408   AST 18 03/12/2021 0832   AST 17 04/14/2017 0742   ALT 5 01/10/2022 1408   ALT 9 03/12/2021 0832   ALT 14 04/14/2017 0742   BILITOT 0.5 01/10/2022 1408   BILITOT 0.8 03/12/2021 0832   BILITOT 0.37 04/14/2017 0742       RADIOGRAPHIC STUDIES: I have reviewed prior CT imaging with the patient I have personally reviewed the radiological  images as listed and agreed with the findings in the report.

## 2022-01-10 NOTE — Telephone Encounter (Signed)
Called back and given below message. Scheduled lab appt and Dr. Alvy Bimler at 2:45 pm. She is aware of appt today and verbalized understanding.

## 2022-01-10 NOTE — Telephone Encounter (Signed)
Called and left below message. Ask her to call the office back. 

## 2022-01-10 NOTE — Assessment & Plan Note (Signed)
I have reviewed prior CT imaging with the patient and her husband Her blood work and examination is benign It is possible she might have intermittent right upper quadrant discomfort due to her gallbladder disease We discussed potential imaging study and dietary modification versus surgery For now, she is comfortable to be observed and agree for dietary changes If she have recurrent symptoms that are worse, I will refer her to general surgery to consider cholecystectomy She is in agreement with the plan of care

## 2022-01-11 ENCOUNTER — Other Ambulatory Visit: Payer: Self-pay | Admitting: Hematology and Oncology

## 2022-01-11 ENCOUNTER — Telehealth: Payer: Self-pay

## 2022-01-11 DIAGNOSIS — C5701 Malignant neoplasm of right fallopian tube: Secondary | ICD-10-CM

## 2022-01-11 LAB — CA 125: Cancer Antigen (CA) 125: 55.1 U/mL — ABNORMAL HIGH (ref 0.0–38.1)

## 2022-01-11 NOTE — Telephone Encounter (Signed)
-----   Message from Heath Lark, MD sent at 01/11/2022  1:40 PM EDT ----- Her CA-125 came back a bit high I suggest for repeat CT imaging of abdomen and pelvis with labs on 10/11 and see me on 10/13 If she agrees, cancel her current appt and send LOS for labs,flush on 10/11 and see me on 10/13, 45 mins, see me at 1 pm

## 2022-01-11 NOTE — Telephone Encounter (Signed)
Called and given below message from Dr. Alvy Bimler. She verbalized understanding. She will call radiology scheduling to schedule CT.

## 2022-01-28 ENCOUNTER — Encounter: Payer: Self-pay | Admitting: Hematology and Oncology

## 2022-01-29 ENCOUNTER — Inpatient Hospital Stay: Payer: Medicare Other | Attending: Hematology and Oncology

## 2022-01-29 DIAGNOSIS — R21 Rash and other nonspecific skin eruption: Secondary | ICD-10-CM | POA: Insufficient documentation

## 2022-01-29 DIAGNOSIS — C5701 Malignant neoplasm of right fallopian tube: Secondary | ICD-10-CM

## 2022-01-29 DIAGNOSIS — Z7189 Other specified counseling: Secondary | ICD-10-CM

## 2022-01-29 DIAGNOSIS — C563 Malignant neoplasm of bilateral ovaries: Secondary | ICD-10-CM | POA: Diagnosis not present

## 2022-01-29 DIAGNOSIS — J984 Other disorders of lung: Secondary | ICD-10-CM | POA: Insufficient documentation

## 2022-01-29 LAB — CBC WITH DIFFERENTIAL/PLATELET
Abs Immature Granulocytes: 0.03 10*3/uL (ref 0.00–0.07)
Basophils Absolute: 0.1 10*3/uL (ref 0.0–0.1)
Basophils Relative: 1 %
Eosinophils Absolute: 0.4 10*3/uL (ref 0.0–0.5)
Eosinophils Relative: 7 %
HCT: 34 % — ABNORMAL LOW (ref 36.0–46.0)
Hemoglobin: 11.3 g/dL — ABNORMAL LOW (ref 12.0–15.0)
Immature Granulocytes: 1 %
Lymphocytes Relative: 18 %
Lymphs Abs: 1 10*3/uL (ref 0.7–4.0)
MCH: 31 pg (ref 26.0–34.0)
MCHC: 33.2 g/dL (ref 30.0–36.0)
MCV: 93.2 fL (ref 80.0–100.0)
Monocytes Absolute: 0.5 10*3/uL (ref 0.1–1.0)
Monocytes Relative: 9 %
Neutro Abs: 3.8 10*3/uL (ref 1.7–7.7)
Neutrophils Relative %: 64 %
Platelets: 160 10*3/uL (ref 150–400)
RBC: 3.65 MIL/uL — ABNORMAL LOW (ref 3.87–5.11)
RDW: 14 % (ref 11.5–15.5)
WBC: 5.9 10*3/uL (ref 4.0–10.5)
nRBC: 0 % (ref 0.0–0.2)

## 2022-01-29 LAB — COMPREHENSIVE METABOLIC PANEL
ALT: 6 U/L (ref 0–44)
AST: 13 U/L — ABNORMAL LOW (ref 15–41)
Albumin: 3.5 g/dL (ref 3.5–5.0)
Alkaline Phosphatase: 50 U/L (ref 38–126)
Anion gap: 3 — ABNORMAL LOW (ref 5–15)
BUN: 28 mg/dL — ABNORMAL HIGH (ref 8–23)
CO2: 27 mmol/L (ref 22–32)
Calcium: 8.7 mg/dL — ABNORMAL LOW (ref 8.9–10.3)
Chloride: 107 mmol/L (ref 98–111)
Creatinine, Ser: 1.36 mg/dL — ABNORMAL HIGH (ref 0.44–1.00)
GFR, Estimated: 43 mL/min — ABNORMAL LOW (ref >60)
Glucose, Bld: 110 mg/dL — ABNORMAL HIGH (ref 70–99)
Potassium: 4.4 mmol/L (ref 3.5–5.1)
Sodium: 137 mmol/L (ref 135–145)
Total Bilirubin: 0.4 mg/dL (ref 0.3–1.2)
Total Protein: 6 g/dL — ABNORMAL LOW (ref 6.5–8.1)

## 2022-01-29 LAB — TOTAL PROTEIN, URINE DIPSTICK: Protein, ur: NEGATIVE mg/dL

## 2022-01-29 MED ORDER — SODIUM CHLORIDE 0.9% FLUSH
10.0000 mL | Freq: Once | INTRAVENOUS | Status: AC
  Administered 2022-01-29: 10 mL

## 2022-01-29 MED ORDER — HEPARIN SOD (PORK) LOCK FLUSH 100 UNIT/ML IV SOLN
500.0000 [IU] | Freq: Once | INTRAVENOUS | Status: AC
  Administered 2022-01-29: 500 [IU]

## 2022-01-30 ENCOUNTER — Encounter (HOSPITAL_COMMUNITY): Payer: Self-pay

## 2022-01-30 ENCOUNTER — Ambulatory Visit (HOSPITAL_COMMUNITY)
Admission: RE | Admit: 2022-01-30 | Discharge: 2022-01-30 | Disposition: A | Discharge status: Home / Self Care | Payer: Medicare Other | Source: Ambulatory Visit | Attending: Hematology and Oncology | Admitting: Hematology and Oncology

## 2022-01-30 DIAGNOSIS — C5701 Malignant neoplasm of right fallopian tube: Secondary | ICD-10-CM | POA: Diagnosis present

## 2022-01-30 MED ORDER — IOHEXOL 300 MG/ML  SOLN
100.0000 mL | Freq: Once | INTRAMUSCULAR | Status: AC | PRN
  Administered 2022-01-30: 75 mL via INTRAVENOUS

## 2022-01-30 MED ORDER — SODIUM CHLORIDE (PF) 0.9 % IJ SOLN
INTRAMUSCULAR | Status: AC
  Filled 2022-01-30: qty 50

## 2022-01-31 LAB — CA 125: Cancer Antigen (CA) 125: 59.5 U/mL — ABNORMAL HIGH (ref 0.0–38.1)

## 2022-02-01 ENCOUNTER — Inpatient Hospital Stay (HOSPITAL_BASED_OUTPATIENT_CLINIC_OR_DEPARTMENT_OTHER): Payer: Medicare Other | Admitting: Hematology and Oncology

## 2022-02-01 ENCOUNTER — Other Ambulatory Visit: Payer: Self-pay

## 2022-02-01 ENCOUNTER — Other Ambulatory Visit: Payer: Self-pay | Admitting: Hematology and Oncology

## 2022-02-01 ENCOUNTER — Encounter: Payer: Self-pay | Admitting: Hematology and Oncology

## 2022-02-01 VITALS — BP 129/54 | HR 60 | Resp 18 | Ht 65.0 in | Wt 155.8 lb

## 2022-02-01 DIAGNOSIS — K5909 Other constipation: Secondary | ICD-10-CM | POA: Insufficient documentation

## 2022-02-01 DIAGNOSIS — C5701 Malignant neoplasm of right fallopian tube: Secondary | ICD-10-CM

## 2022-02-01 DIAGNOSIS — L299 Pruritus, unspecified: Secondary | ICD-10-CM

## 2022-02-01 DIAGNOSIS — J984 Other disorders of lung: Secondary | ICD-10-CM | POA: Diagnosis not present

## 2022-02-01 MED ORDER — PREDNISONE 20 MG PO TABS: 20.0000 mg | ORAL_TABLET | Freq: Every day | ORAL | 0 refills | Status: DC

## 2022-02-01 NOTE — Assessment & Plan Note (Signed)
The cause is unknown It does not appear to be truly metastatic in nature in my opinion I recommend pulmonary consultation

## 2022-02-01 NOTE — Assessment & Plan Note (Signed)
I recommend aggressive laxative therapy

## 2022-02-01 NOTE — Assessment & Plan Note (Addendum)
She had recent skin biopsy which showed dermatitis Provoking agent is unknown I recommend a trial of prednisone along with topical emollient I will see her again in 10 days for further follow-up We discussed the risk and benefits of prednisone and risk of provoking further hypertension and she is in agreement to try

## 2022-02-01 NOTE — Progress Notes (Signed)
[place] OFFICE PROGRESS NOTE  Patient Care Team: Gaynelle Arabian, MD as PCP - General (Family Medicine) Heath Lark, MD as Consulting Physician (Hematology and Oncology) Everitt Amber, MD as Consulting Physician (Obstetrics and Gynecology)  ASSESSMENT & PLAN:  Adenocarcinoma of right fallopian tube [facility]) I have reviewed CT imaging with the patient and her husband I could not really identify anything that could cause elevated Ca1 25 The lung abnormalities remain the same I do not believe this is the cause of her skin rash I recommend continue close surveillance and tumor marker monitoring  Cavitary lesion of lung The cause is unknown It does not appear to be truly metastatic in nature in my opinion I recommend pulmonary consultation  Itch of skin She had recent skin biopsy which showed dermatitis Provoking agent is unknown I recommend a trial of prednisone along with topical emollient I will see her again in 10 days for further follow-up We discussed the risk and benefits of prednisone and risk of provoking further hypertension and she is in agreement to try  Other constipation I recommend aggressive laxative therapy  Orders Placed This Encounter  Procedures   Ambulatory referral to Pulmonology    Referral Priority:   Routine    Referral Type:   Consultation    Referral Reason:   Specialty Services Required    Requested Specialty:   Pulmonary Disease    Number of Visits Requested:   1    All questions were answered. The patient knows to call the clinic with any problems, questions or concerns. The total time spent in the appointment was 30 minutes encounter with patients including review of chart and various tests results, discussions about plan of care and coordination of care plan   Heath Lark, MD 02/01/2022 1:20 PM  INTERVAL HISTORY: Please see below for problem oriented charting. she returns for review of test results She continues to have  persistent skin rash that bothers her somewhat Her skin is incredibly itchy She denies recent cough, chest pain or shortness of breath She had large bowel movement after CT imaging She has intermittent mild constipation on and off  REVIEW OF SYSTEMS:   Constitutional: Denies fevers, chills or abnormal weight loss Eyes: Denies blurriness of vision Ears, nose, mouth, throat, and face: Denies mucositis or sore throat Respiratory: Denies cough, dyspnea or wheezes Cardiovascular: Denies palpitation, chest discomfort or lower extremity swelling Lymphatics: Denies new lymphadenopathy or easy bruising Neurological:Denies numbness, tingling or new weaknesses Behavioral/Psych: Mood is stable, no new changes  All other systems were reviewed with the patient and are negative.  I have reviewed the past medical history, past surgical history, social history and family history with the patient and they are unchanged from previous note.  ALLERGIES:  is allergic to dilaudid [hydromorphone hcl], morphine and related, and sudafed [pseudoephedrine hcl].  MEDICATIONS:  Current Outpatient Medications  Medication Sig Dispense Refill   predniSONE (DELTASONE) 20 MG tablet Take 1 tablet (20 mg total) by mouth daily with breakfast. 60 tablet 0   acetaminophen (TYLENOL) 325 MG tablet Take 650 mg by mouth every 6 (six) hours as needed for headache (pain).     atenolol (TENORMIN) 25 MG tablet TAKE 1 TABLET BY MOUTH EVERY DAY 30 tablet 2   cloNIDine (CATAPRES) 0.1 MG tablet TAKE 1 TABLET BY MOUTH EVERY DAY 30 tablet 1   doxazosin (CARDURA) 4 MG tablet TAKE 1 TABLET BY MOUTH EVERY DAY 30 tablet 1   hydrochlorothiazide (HYDRODIURIL) 25 MG tablet  TAKE 1 TABLET (25 MG TOTAL) BY MOUTH DAILY. 30 tablet 3   lidocaine-prilocaine (EMLA) cream Apply to affected area once daily as directed. (Patient taking differently: 1 application. as needed (access port). Apply to affected area once daily as directed.) 30 g 3   lisinopril  (ZESTRIL) 20 MG tablet TAKE 1 TABLET BY MOUTH EVERY DAY IN THE EVENING 60 tablet 0   venlafaxine XR (EFFEXOR-XR) 37.5 MG 24 hr capsule TAKE 1 CAPSULE BY MOUTH DAILY WITH BREAKFAST 90 capsule 3   No current facility-administered medications for this visit.   Facility-Administered Medications Ordered in Other Visits  Medication Dose Route Frequency Provider Last Rate Last Admin   sodium chloride flush (NS) 0.9 % injection 10 mL  10 mL Intravenous PRN Alvy Bimler, Giovonnie Trettel, MD   10 mL at 02/11/17 0839   sodium chloride flush (NS) 0.9 % injection 10 mL  10 mL Intravenous PRN Alvy Bimler, Omkar Stratmann, MD   10 mL at 03/04/17 1510    SUMMARY OF ONCOLOGIC HISTORY: Oncology History Overview Note  Neg genetics   Adenocarcinoma of right fallopian tube ([place])  08/12/2016 Initial Diagnosis   The patient has many months of vague symptoms of fullness in the pelvis, urinary frequency and difficulty with defecation. She denies vaginal bleeding or rectal bleeding.     08/12/2016 Imaging   Abdomen X-ray in the ER: Moderate colonic stool burden without evidence of enteric obstruction   11/13/2016 Imaging   TVUS was performed on 11/13/16 which showed a large hypoechoic lobular mass seen posterior to the uterus measuring 18.2x16.1x11.8cm, blood flow was seen along the periphery of the mass. It was considered to be likely to be a fibroid vs pelvic mass vs ovarian mass. Neither ovary was removed. Moderate amount of free fluid in the pelvis. THe uterus measures 4x10x4cm. The endometrium is 21mm.    12/05/2016 Imaging   MR pelvis 1. Mild limitations as detailed above. 2. Heterogeneous pelvic mass is favored to arise from the right ovary. Favor a solid ovarian neoplasm such as fibroma/Brenner's tumor. Given lesion size and heterogeneous T2 signal out, complicating torsion cannot be excluded. 3. Moderate abdominopelvic ascites, without specific evidence of peritoneal metastasis. Consider further evaluation with contrast-enhanced  abdominopelvic CT.    12/26/2016 Pathology Results   1. Uterus, ovaries and fallopian tubes - ADENOCARCINOMA INVOLVING RIGHT FALLOPIAN TUBE, RIGHT AND LEFT OVARIES, UTERINE SEROSA AND PELVIC MASS. - SEE ONCOLOGY TABLE AND COMMENT. - UTERINE CERVIX, ENDOMETRIUM, MYOMETRIUM AND LEFT FALLOPIAN TUBE FREE OF TUMOR. 2. Omentum, resection for tumor - ADENOCARCINOMA. Microscopic Comment 1. ONCOLOGY TABLE - FALLOPIAN TUBE. SEE COMMENT 1. Specimen, including laterality: Uterus, bilateral adnexa, pelvic mass and omentum. 2. Procedure: Hysterectomy with bilateral salpingo-oophorectomy, pelvic mass excision and omentectomy. 3. Lymph node sampling performed: No 4. Tumor site: See comment. 5. Tumor location in fallopian tube: Right fallopian tube fimbria 6. Specimen integrity (intact/ruptured/disrupted): Intact 7. Tumor size (cm): 18 cm, see comment. 8. Histologic type: Adenocarcinoma 9. Grade: High grade 10. Microscopic tumor extension: Tumor involves right fallopian tube, right and left ovaries, uterine serosa, pelvic mass and omentum. 11. Margins: See comment. 12. Lymph-Vascular invasion: Present 13. Lymph nodes: # examined: 0; # positive: N/A 14. TNM: pT3c, pNX 15. FIGO Stage (based on pathologic findings, needs clinical correlation: III-C 16. Comments: There is an 18 cm pelvic mass which is a high grade adenocarcinoma and there is a 12.2 cm  segment of omentum which is extensively involved with adenocarcinoma. The tumor also involves the fimbria of the right fallopian  tube and the parenchyma of the right and left ovaries as well as uterine serosa. The fimbria of the right fallopian has intraepithelial atypia consistent with a precursor lesion and therefore this is most consistent with primary fallopian tube adenocarcinoma. A primary peritoneal serous carcinoma is a less likely possibility.    12/26/2016 Pathology Results   PERITONEAL/ASCITIC FLUID(SPECIMEN 1 OF 1 COLLECTED 12/26/16): POORLY  DIFFERENTIATED ADENOCARCINOMA   12/26/2016 Surgery   Procedure(s) Performed: Exploratory laparotomy with total abdominal hysterectomy, bilateral salpingo-oophorectomy, omentectomy radical tumor debulking for ovarian cancer .   Surgeon: Thereasa Solo, MD.      Operative Findings: 20cm left ovarian mass densely adherent to the posterior uterus, right tube and ovary, cervix, sigmoid colon. 10cm omental cake. 4L ascites. 69m size tumor nodules on the serosa of the terminal ileum and proximal sigmoid colon. 1763mnodules on right diaphragm.    This represented an optimal cytoreduction (R1) with 63m30modules on intestine and diaphragm representing gross visible disease   01/16/2017 Procedure   Placement of single lumen port a cath via right internal jugular vein. The catheter tip lies at the cavo-atrial junction. A power injectable port a cath was placed and is ready for immediate use.   01/17/2017 Imaging   1. 3.5 x 7.2 x 4.6 cm fluid collection along the vaginal cuff, posterior the bladder and extending into the right adnexal space. This lesion demonstrates rim enhancement. Imaging features could be related to a loculated postoperative seroma or hematoma. Superinfection cannot be excluded by CT. Given debulking surgery was 3 weeks ago, this entire structure is un likely to represent neoplasm, but peritoneal involvement could have this appearance. 2. Small fluid collection in the left para colic gutter without rim enhancement. 3. Irregular/nodular appearance of the peritoneal M in the anatomic pelvis with areas of subtle nodularity between the stomach and the spleen. Close attention in these regions on follow-up recommended as metastatic disease is a concern. 4. Mildly enlarged hepatoduodenal ligament lymph node. Attention on follow-up recommended.   01/20/2017 Tumor Marker   Patient's tumor was tested for the following markers: CA-125 Results of the tumor marker test revealed 46.6   01/28/2017 Genetic  Testing       Negative genetic testing on the [facility].  The [facility] panel offered by [employer] sequencing and deletion/duplication testing of the following 28 genes: APC, ATM, BARD1, BMPR1A, BRCA1, BRCA2, BRIP1, CHD1, CDK4, CDKN2A, CHEK2, EPCAM (large rearrangement only), MLH1, MSH2, MSH6, MUTYH, NBN, PALB2, PMS2, PTEN, RAD51C, RAD51D, SMAD4, STK11, and TP53. Sequencing was performed for select regions of POLE and POLD1, and large rearrangement analysis was performed for select regions of GREM1. The report date is January 28, 2017.  HRD testing looking for genomic instability and BRCA mutations was negative.  The report date of this test is January 27, 2017.    01/31/2017 Tumor Marker   Patient's tumor was tested for the following markers: CA-125 Results of the tumor marker test revealed 22.4   03/04/2017 Tumor Marker   Patient's tumor was tested for the following markers: CA-125 Results of the tumor marker test revealed 13.8   04/18/2017 Tumor Marker   Patient's tumor was tested for the following markers: CA-125 Results of the tumor marker test revealed 11.9   05/05/2017 Tumor Marker   Patient's tumor was tested for the following markers: CA-125 Results of the tumor marker test revealed 12   05/26/2017 Tumor Marker   Patient's tumor was tested for the following markers:  CA-125 Results of the tumor marker test revealed 10.6   05/26/2017 Imaging   1. A previously noted postoperative fluid collection in the low pelvis and residual ascites seen on the prior examination has resolved on today's study. No findings to suggest residual/recurrent disease on today's examination. No definite solid organ metastasis identified in the abdomen or pelvis. No lymphadenopathy. 2. Aortic atherosclerosis.   06/10/2017 Procedure   Successful right IJ vein Port-A-Cath explant.   03/09/2018 Tumor Marker   Patient's tumor was tested for the following markers:  CA-125 Results of the tumor marker test revealed 12.1   05/26/2018 Tumor Marker   Patient's tumor was tested for the following markers: CA-125 Results of the tumor marker test revealed 13.8   09/09/2018 Tumor Marker   Patient's tumor was tested for the following markers: CA-125 Results of the tumor marker test revealed 23.9   12/18/2018 Tumor Marker   Patient's tumor was tested for the following markers: CA-125 Results of the tumor marker test revealed 43.8   02/27/2019 - 02/28/2019 Hospital Admission   She was admitted for bowel obstruction   02/27/2019 Imaging   1. High-grade small bowel obstruction with transition point in the pelvis in an area of suspected desmoplastic reaction surrounding a 1.3 cm spiculated nodule in the mesentery, suspicious for carcinoid. There is hyperenhancement near the tip of the appendix and loss of the normal fat plane between it and the adjacent sigmoid colon, potentially the site of primary lesion. 2. Multiple new small subcentimeter hyperdense lesions scattered along the liver capsule, concerning for metastases. Enlarged hyperenhancing gastrohepatic and portacaval lymph nodes concerning for nodal metastases. 3. Very mild right hydroureteronephrosis to the level of the desmoplastic reaction in the pelvis, potentially involving the right ureter. 4. Infraumbilical ventral abdominal wall diastasis containing a small nondilated portion of transverse colon. 5. Trace ascites. 6. Cholelithiasis. 7.  Aortic atherosclerosis (ICD10-I70.0).     03/05/2019 Tumor Marker   Patient's tumor was tested for the following markers: CA-125 Results of the tumor marker test revealed 70.1   03/12/2019 Echocardiogram   IMPRESSIONS     1. Left ventricular ejection fraction, by visual estimation, is 60 to 65%. The left ventricle has normal function. There is no left ventricular hypertrophy.  2. Left ventricular diastolic parameters are consistent with Grade I diastolic dysfunction  (impaired relaxation).  3. Global right ventricle has normal systolic function.The right ventricular size is normal. No increase in right ventricular wall thickness.  4. Left atrial size was normal.  5. Right atrial size was normal.  6. The pericardium was not assessed.  7. The mitral valve is normal in structure. No evidence of mitral valve regurgitation.  8. The tricuspid valve is normal in structure. Tricuspid valve regurgitation is trivial.  9. The aortic valve is normal in structure. Aortic valve regurgitation is not visualized. 10. The pulmonic valve was grossly normal. Pulmonic valve regurgitation is not visualized. 11. The average left ventricular global longitudinal strain is -17.6 %.   03/16/2019 Procedure   Successful placement of a right internal jugular approach power injectable Port-A-Cath. The catheter is ready for immediate use.     04/19/2019 Tumor Marker   Patient's tumor was tested for the following markers: CA-125 Results of the tumor marker test revealed 39.8   05/17/2019 Tumor Marker   Patient's tumor was tested for the following markers: CA-125 Results of the tumor marker test revealed 27   05/28/2019 Tumor Marker   Patient's tumor was tested for the following markers: CA-125  Results of the tumor marker test revealed 27.7.   06/11/2019 Imaging   1. No new or progressive metastatic disease in the abdomen or pelvis. 2. Tiny noncalcified perisplenic implant is decreased. Calcified pericardiophrenic, retroperitoneal and bilateral inguinal lymph nodes and scattered small calcified perihepatic and perisplenic implants are stable. 3.  Aortic Atherosclerosis (ICD10-I70.0).       06/17/2019 Echocardiogram    1. Left ventricular ejection fraction, by estimation, is 65 to 70%. The left ventricle has normal function. The left ventricle has no regional wall motion abnormalities. Left ventricular diastolic parameters were normal.  2. Right ventricular systolic function is  normal. The right ventricular size is normal.  3. The mitral valve is normal in structure and function. No evidence of mitral valve regurgitation. No evidence of mitral stenosis.  4. The aortic valve is normal in structure and function. Aortic valve regurgitation is not visualized. No aortic stenosis is present.   06/21/2019 Tumor Marker   Patient's tumor was tested for the following markers: CA-125 Results of the tumor marker test revealed 18.7   07/19/2019 Tumor Marker   Patient's tumor was tested for the following markers: CA-125 Results of the tumor marker test revealed 16.7   08/30/2019 Tumor Marker   Patient's tumor was tested for the following markers: CA-125. Results of the tumor marker test revealed 13.9   09/15/2019 Echocardiogram    1. Left ventricular ejection fraction, by estimation, is 65 to 70%. The left ventricle has normal function. The left ventricle has no regional wall motion abnormalities. Left ventricular diastolic parameters are consistent with Grade I diastolic dysfunction (impaired relaxation). The average left ventricular global longitudinal strain is 18.1 %. The global longitudinal strain is normal.  2. Right ventricular systolic function is normal. The right ventricular size is normal.  3. The mitral valve is normal in structure. No evidence of mitral valve regurgitation. No evidence of mitral stenosis.  4. The aortic valve is normal in structure. Aortic valve regurgitation is not visualized. No aortic stenosis is present.  5. The inferior vena cava is normal in size with greater than 50% respiratory variability, suggesting right atrial pressure of 3 mmHg.     09/24/2019 Imaging   1. Stable exam. No new or progressive metastatic disease on today's study. 2. Small subcapsular hypodensity in the lateral spleen, new in the interval, but indeterminate. Attention on follow-up recommended. 3. The calcified upper abdominal and groin lymph nodes are stable as are the scattered  calcified and noncalcified peritoneal implants. 4. Stable midline ventral hernia containing a short segment of colon without complicating features. 5. Aortic Atherosclerosis (ICD10-I70.0).     10/04/2019 - 09/21/2021 Chemotherapy   Patient is on Treatment Plan : ovarian Bevacizumab q21d     10/04/2019 Tumor Marker   Patient's tumor was tested for the following markers: CA-125. Results of the tumor marker test revealed 12.9.   11/01/2019 Tumor Marker   Patient's tumor was tested for the following markers: CA-125 Results of the tumor marker test revealed 15.4   12/13/2019 Tumor Marker   Patient's tumor was tested for the following markers: CA-125 Results of the tumor marker test revealed 14.8   12/31/2019 Imaging   1. Unchanged tiny peritoneal nodule adjacent to the spleen measuring 6 mm. Other previously noted peritoneal nodules are imperceptible on present examination. 2. Unchanged prominent, partially calcified portacaval lymph node and bilateral inguinal lymph nodes. 3. Unchanged subtle, nonspecific subcapsular lesion of the spleen.  4. No evidence of new metastatic disease in the abdomen  or pelvis. 5. Status post hysterectomy. 6. Unchanged low midline ventral abdominal hernia containing a single loop of nonobstructed transverse colon. 7. Cholelithiasis. 8. Aortic Atherosclerosis (ICD10-I70.0).       01/03/2020 Tumor Marker   Patient's tumor was tested for the following markers: CA-125 Results of the tumor marker test revealed 11.7.   01/24/2020 Tumor Marker   Patient's tumor was tested for the following markers: CA-125. Results of the tumor marker test revealed 15.1   03/06/2020 Tumor Marker   Patient's tumor was tested for the following markers: CA-125. Results of the tumor marker test revealed 20.3   03/27/2020 Tumor Marker   Patient's tumor was tested for the following markers: CA-125 Results of the tumor marker test revealed 23.7   05/11/2020 Tumor Marker   Patient's  tumor was tested for the following markers: CA-125. Results of the tumor marker test revealed 25.4   06/23/2020 Imaging   1. Cholelithiasis with gallbladder wall thickening and mild infiltrative edema in the porta hepatis, raising the possibility of acute cholecystitis. There is truncation of the common bile duct distally and distal choledocholithiasis is difficult to exclude. Correlate with bilirubin levels. If clinically warranted, MRCP could be utilized for further characterization. 2. Stable tiny perisplenic nodule. 3. Other imaging findings of potential clinical significance: Small type 1 hiatal hernia. Prominent stool throughout the colon favors constipation. Ventral infraumbilical hernia contains transverse colon without findings of strangulation or obstruction. Small supraumbilical hernia contains adipose tissue. Lumbar degenerative disc disease at L5-S1. Stable tiny nodule along the lateral margin of the spleen, nonspecific. 4. Aortic atherosclerosis.     12/15/2020 Imaging   No evidence of recurrent or metastatic carcinoma within the abdomen or pelvis.   Stable ventral hernia containing transverse colon. No evidence of bowel obstruction or strangulation.   Cholelithiasis. No radiographic evidence of cholecystitis.   Tiny hiatal hernia.   Aortic Atherosclerosis (ICD10-I70.0).     06/11/2021 Imaging   1. Numerous sub 4 mm pulmonary nodules in the lungs some of which have a cavitary appearance. I think it is unlikely this is metastatic disease and more likely a possible immunotherapy related process such as a sarcoid like reaction. Recommend full chest CT further evaluation. 2. Stable small calcified retroperitoneal and mesenteric lymph nodes. No findings suspicious for recurrent peritoneal carcinomatosis. 3. Stable lower abdominal wall hernia containing part of the transverse colon. 4. Cholelithiasis.   09/27/2021 Imaging   1. Numerous small pulmonary nodules throughout the lungs, the  majority of which are cavitary. These are all subcentimeter, however nodules that were seen in the bilateral lung bases on prior CT dated 06/08/2021 appear slightly increased in size and number. Behavior is highly suspicious for pulmonary metastatic disease despite the unusual appearance, however as previously reported general differential considerations include atypical infection and inflammation, such as drug-induced sarcoidosis like reaction.  2. Unchanged calcified lymph nodes in the abdomen and pelvis, consistent with treated nodal metastases. 3. Cholelithiasis with wall thickening and pericholecystic fluid. This appearance is similar to prior examination and suggests chronic cholecystitis. Correlate for clinical symptoms. Mild intrahepatic biliary ductal dilatation, similar to prior examination. 4. Midline ventral hernia containing a single nonobstructed loop of mid transverse colon.     12/25/2021 Imaging   1. Again noted are multiple thin walled cavitary lung nodules throughout both lungs. These are similar in size and multiplicity when compared with the exam from 09/26/2021. Etiology remains indeterminate, although metastatic disease cannot be excluded with a high degree of certainty. 2. Stable calcified upper  abdominal lymph nodes consistent with treated nodal metastases. 3. Gallstones. 4. Aortic Atherosclerosis (ICD10-I70.0).   01/30/2022 Tumor Marker   Patient's tumor was tested for the following markers: CA-125. Results of the tumor marker test revealed 59.3.   01/31/2022 Imaging   1. No substantial interval change in exam. 2. Innumerable tiny cavitary pulmonary nodules in the lung bases. As noted previously, metastatic disease would be distinct consideration although imaging features are nonspecific and infectious/inflammatory etiology is not excluded. 3. Calcified nodal disease in the upper abdomen and pelvis shows no substantial interval change. Scattered noncalcified nodules in  the mesentery are similar to prior. 4. Markedly large stool volume throughout the colon. Imaging features suggestive of clinical constipation. 5. Cholelithiasis with ill-defined appearance of the gallbladder wall. Appearance is stable in the interval. 6. Trace free fluid in the cul-de-sac. 7. Midline ventral hernia contains a short segment of transverse colon without complicating features. 8. Aortic Atherosclerosis (ICD10-I70.0).     PHYSICAL EXAMINATION: ECOG PERFORMANCE STATUS: 1 - Symptomatic but completely ambulatory  Vitals:   02/01/22 1257  BP: (!) 129/54  Pulse: 60  Resp: 18  SpO2: 99%   Filed Weights   02/01/22 1257  Weight: 155 lb 12.8 oz (70.7 kg)    GENERAL:alert, no distress and comfortable SKIN: She has persistent diffuse skin rash NEURO: alert & oriented x 3 with fluent speech, no focal motor/sensory deficits  LABORATORY DATA:  I have reviewed the data as listed    Component Value Date/Time   NA 137 01/29/2022 1411   NA 139 04/14/2017 0742   K 4.4 01/29/2022 1411   K 4.0 04/14/2017 0742   CL 107 01/29/2022 1411   CO2 27 01/29/2022 1411   CO2 21 (L) 04/14/2017 0742   GLUCOSE 110 (H) 01/29/2022 1411   GLUCOSE 247 (H) 04/14/2017 0742   BUN 28 (H) 01/29/2022 1411   BUN 13.1 04/14/2017 0742   CREATININE 1.36 (H) 01/29/2022 1411   CREATININE 0.99 03/12/2021 0832   CREATININE 0.9 04/14/2017 0742   CALCIUM 8.7 (L) 01/29/2022 1411   CALCIUM 9.2 04/14/2017 0742   PROT 6.0 (L) 01/29/2022 1411   PROT 7.3 04/14/2017 0742   ALBUMIN 3.5 01/29/2022 1411   ALBUMIN 3.9 04/14/2017 0742   AST 13 (L) 01/29/2022 1411   AST 18 03/12/2021 0832   AST 17 04/14/2017 0742   ALT 6 01/29/2022 1411   ALT 9 03/12/2021 0832   ALT 14 04/14/2017 0742   ALKPHOS 50 01/29/2022 1411   ALKPHOS 70 04/14/2017 0742   BILITOT 0.4 01/29/2022 1411   BILITOT 0.8 03/12/2021 0832   BILITOT 0.37 04/14/2017 0742   GFRNONAA 43 (L) 01/29/2022 1411   GFRNONAA >60 03/12/2021 0832   GFRAA >60  01/24/2020 0924   GFRAA >60 07/19/2019 0951    No results found for: "SPEP", "UPEP"  Lab Results  Component Value Date   WBC 5.9 01/29/2022   NEUTROABS 3.8 01/29/2022   HGB 11.3 (L) 01/29/2022   HCT 34.0 (L) 01/29/2022   MCV 93.2 01/29/2022   PLT 160 01/29/2022      Chemistry      Component Value Date/Time   NA 137 01/29/2022 1411   NA 139 04/14/2017 0742   K 4.4 01/29/2022 1411   K 4.0 04/14/2017 0742   CL 107 01/29/2022 1411   CO2 27 01/29/2022 1411   CO2 21 (L) 04/14/2017 0742   BUN 28 (H) 01/29/2022 1411   BUN 13.1 04/14/2017 0742   CREATININE 1.36 (H) 01/29/2022  1411   CREATININE 0.99 03/12/2021 0832   CREATININE 0.9 04/14/2017 0742      Component Value Date/Time   CALCIUM 8.7 (L) 01/29/2022 1411   CALCIUM 9.2 04/14/2017 0742   ALKPHOS 50 01/29/2022 1411   ALKPHOS 70 04/14/2017 0742   AST 13 (L) 01/29/2022 1411   AST 18 03/12/2021 0832   AST 17 04/14/2017 0742   ALT 6 01/29/2022 1411   ALT 9 03/12/2021 0832   ALT 14 04/14/2017 0742   BILITOT 0.4 01/29/2022 1411   BILITOT 0.8 03/12/2021 0832   BILITOT 0.37 04/14/2017 0742       RADIOGRAPHIC STUDIES: I have reviewed multiple imaging studies with the patient and her husband I have personally reviewed the radiological images as listed and agreed with the findings in the report. CT ABDOMEN PELVIS W CONTRAST  Result Date: 01/31/2022 CLINICAL DATA:  Ovarian cancer.  Restaging.  * Tracking Code: BO * EXAM: CT ABDOMEN AND PELVIS WITH CONTRAST TECHNIQUE: Multidetector CT imaging of the abdomen and pelvis was performed using the standard protocol following bolus administration of intravenous contrast. RADIATION DOSE REDUCTION: This exam was performed according to the departmental dose-optimization program which includes automated exposure control, adjustment of the mA and/or kV according to patient size and/or use of iterative reconstruction technique. CONTRAST:  39mL OMNIPAQUE IOHEXOL 300 MG/ML  SOLN COMPARISON:   09/26/2021 FINDINGS: Lower chest: Innumerable tiny cavitary pulmonary nodules are again identified in the lung bases. Hepatobiliary: No suspicious focal abnormality within the liver parenchyma. Gallbladder is nondistended with ill-defined appearance of the gallbladder wall. 8 mm calcified gallstone evident. No intrahepatic or extrahepatic biliary dilation. Pancreas: No focal mass lesion. No dilatation of the main duct. No intraparenchymal cyst. No peripancreatic edema. Spleen: No splenomegaly. No focal mass lesion. Adrenals/Urinary Tract: No adrenal nodule or mass. Kidneys unremarkable. No evidence for hydroureter. The urinary bladder appears normal for the degree of distention. Stomach/Bowel: Tiny hiatal hernia. Stomach is unremarkable. No gastric wall thickening. No evidence of outlet obstruction. Duodenum is normally positioned as is the ligament of Treitz. No small bowel wall thickening. No small bowel dilatation. The terminal ileum is normal. The appendix is normal. Markedly large stool volume noted throughout the colon. Vascular/Lymphatic: There is advanced atherosclerotic calcification of the abdominal aorta without aneurysm. 9 mm short axis calcified hepatoduodenal ligament lymph node on 25/2 is stable. Similar appearance calcified 10 mm short axis gastrohepatic ligament lymph node on 23/2 with additional calcified nodal disease seen along the gastric antrum in the porta hepatis. Noncalcified nodules are seen in the central small bowel mesentery (see image 40/2) similar to prior. Soft tissue nodules in the IMA distribution of the central pelvis are not substantially changed in the interval. Calcified mildly enlarged lymph nodes in the groin regions bilaterally are stable. Reproductive: Uterus surgically absent.  There is no adnexal mass. Other: Trace free fluid is seen in the cul-de-sac (75/2). Musculoskeletal: No worrisome lytic or sclerotic osseous abnormality. Ventral hernia contains short segment of  transverse colon without complicating features. IMPRESSION: 1. No substantial interval change in exam. 2. Innumerable tiny cavitary pulmonary nodules in the lung bases. As noted previously, metastatic disease would be distinct consideration although imaging features are nonspecific and infectious/inflammatory etiology is not excluded. 3. Calcified nodal disease in the upper abdomen and pelvis shows no substantial interval change. Scattered noncalcified nodules in the mesentery are similar to prior. 4. Markedly large stool volume throughout the colon. Imaging features suggestive of clinical constipation. 5. Cholelithiasis with ill-defined appearance of  the gallbladder wall. Appearance is stable in the interval. 6. Trace free fluid in the cul-de-sac. 7. Midline ventral hernia contains a short segment of transverse colon without complicating features. 8. Aortic Atherosclerosis (ICD10-I70.0). Electronically Signed   By: Misty Stanley M.D.   On: 01/31/2022 09:32

## 2022-02-01 NOTE — Assessment & Plan Note (Signed)
I have reviewed CT imaging with the patient and her husband I could not really identify anything that could cause elevated Ca1 25 The lung abnormalities remain the same I do not believe this is the cause of her skin rash I recommend continue close surveillance and tumor marker monitoring

## 2022-02-02 ENCOUNTER — Other Ambulatory Visit: Payer: Self-pay | Admitting: Hematology and Oncology

## 2022-02-04 ENCOUNTER — Encounter: Payer: Self-pay | Admitting: Hematology and Oncology

## 2022-02-04 NOTE — Telephone Encounter (Signed)
Contacted patient by phone with Dr. Calton Dach response: Continue taking TWO Prednisone 20 mg tablets daily at this time as discussed during your last appt. If the rash has gone and symptoms resolved, can decrease to ONE tablet per day. Will re-evaluate rash at appt on 10/23 and give directions then for any medication changes.  She said that's what she thought she was supposed to do, but was confused by the directions on the bottle. Patient verbalized understanding of information.

## 2022-02-08 ENCOUNTER — Ambulatory Visit: Payer: Medicare Other | Admitting: Hematology and Oncology

## 2022-02-08 ENCOUNTER — Other Ambulatory Visit: Payer: Medicare Other

## 2022-02-11 ENCOUNTER — Encounter: Payer: Self-pay | Admitting: Hematology and Oncology

## 2022-02-11 ENCOUNTER — Inpatient Hospital Stay (HOSPITAL_BASED_OUTPATIENT_CLINIC_OR_DEPARTMENT_OTHER): Payer: Medicare Other | Admitting: Hematology and Oncology

## 2022-02-11 VITALS — BP 176/7 | HR 54 | Resp 18 | Ht 65.0 in | Wt 150.0 lb

## 2022-02-11 DIAGNOSIS — L299 Pruritus, unspecified: Secondary | ICD-10-CM

## 2022-02-11 DIAGNOSIS — C5701 Malignant neoplasm of right fallopian tube: Secondary | ICD-10-CM

## 2022-02-11 MED ORDER — PREDNISONE 20 MG PO TABS: 20.0000 mg | ORAL_TABLET | Freq: Every day | ORAL | 0 refills | Status: DC

## 2022-02-11 NOTE — Assessment & Plan Note (Addendum)
Her skin itching has resolved with high-dose prednisone She will continue on 20 mg this week and reduce to 10 mg next week I plan to see her next month for further follow-up I recommend topical emollient cream for dry skin

## 2022-02-11 NOTE — Progress Notes (Signed)
[facility] OFFICE PROGRESS NOTE  [name] Care Team: Holly Arabian, MD as PCP - General (Family Medicine) Holly Lark, MD as Consulting Physician (Hematology and Oncology) Holly Amber, MD as Consulting Physician (Obstetrics and Gynecology)  ASSESSMENT & PLAN:  Adenocarcinoma of right fallopian tube [facility]) She is not symptomatic from GYN cancer perspective Her appointment to see pulmonologist this next month I will see her again next month for further follow-up on the skin rash  Itch of skin Her skin itching has resolved with high-dose prednisone She will continue on 20 mg this week and reduce to 10 mg next week I plan to see her next month for further follow-up I recommend topical emollient cream for dry skin  No orders of the defined types were placed in this encounter.   All questions were answered. The [name] knows to call the clinic with any problems, questions or concerns. The total time spent in the appointment was 25 minutes encounter with patients including review of chart and various tests results, discussions about plan of care and coordination of care plan   Holly Lark, MD 02/11/2022 10:42 AM  INTERVAL HISTORY: Please see below for problem oriented charting. she returns for treatment follow-up with her husband She tolerated prednisone well although it interferes with sleep Her blood pressure is elevated today but she is not symptomatic Her skin itching has resolved She tolerated prednisone well overall  REVIEW OF SYSTEMS:   Constitutional: Denies fevers, chills or abnormal weight loss Eyes: Denies blurriness of vision Ears, nose, mouth, throat, and face: Denies mucositis or sore throat Respiratory: Denies cough, dyspnea or wheezes Cardiovascular: Denies palpitation, chest discomfort or lower extremity swelling Gastrointestinal:  Denies nausea, heartburn or change in bowel habits Lymphatics: Denies new lymphadenopathy or easy  bruising Neurological:Denies numbness, tingling or new weaknesses Behavioral/Psych: Mood is stable, no new changes  All other systems were reviewed with the [name] and are negative.  I have reviewed the past medical history, past surgical history, social history and family history with the [name] and they are unchanged from previous note.  ALLERGIES:  is allergic to dilaudid [hydromorphone hcl], morphine and related, and sudafed [pseudoephedrine hcl].  MEDICATIONS:  Current Outpatient Medications  Medication Sig Dispense Refill   acetaminophen (TYLENOL) 325 MG tablet Take 650 mg by mouth every 6 (six) hours as needed for headache (pain).     atenolol (TENORMIN) 25 MG tablet TAKE 1 TABLET BY MOUTH EVERY DAY 30 tablet 2   cloNIDine (CATAPRES) 0.1 MG tablet TAKE 1 TABLET BY MOUTH EVERY DAY 30 tablet 1   doxazosin (CARDURA) 4 MG tablet TAKE 1 TABLET BY MOUTH EVERY DAY 30 tablet 1   hydrochlorothiazide (HYDRODIURIL) 25 MG tablet TAKE 1 TABLET (25 MG TOTAL) BY MOUTH DAILY. 30 tablet 3   lidocaine-prilocaine (EMLA) cream Apply to affected area once daily as directed. ([name] taking differently: 1 application. as needed (access port). Apply to affected area once daily as directed.) 30 g 3   lisinopril (ZESTRIL) 20 MG tablet TAKE 1 TABLET BY MOUTH EVERY DAY IN THE EVENING 60 tablet 0   predniSONE (DELTASONE) 20 MG tablet Take 1 tablet (20 mg total) by mouth daily with breakfast. Taper as directed 60 tablet 0   venlafaxine XR (EFFEXOR-XR) 37.5 MG 24 hr capsule TAKE 1 CAPSULE BY MOUTH DAILY WITH BREAKFAST 90 capsule 3   No current facility-administered medications for this visit.   Facility-Administered Medications Ordered in Other Visits  Medication Dose Route Frequency Provider Last Rate Last  Admin   sodium chloride flush (NS) 0.9 % injection 10 mL  10 mL Intravenous PRN [name], Holly Bina, MD   10 mL at 02/11/17 0839   sodium chloride flush (NS) 0.9 % injection 10 mL  10 mL Intravenous PRN [name],  Holly Hemstreet, MD   10 mL at 03/04/17 1510    SUMMARY OF ONCOLOGIC HISTORY: Oncology History Overview Note  Neg genetics   Adenocarcinoma of right fallopian tube ([name])  08/12/2016 Initial Diagnosis   The [name] has many months of vague symptoms of fullness in the pelvis, urinary frequency and difficulty with defecation. She denies vaginal bleeding or rectal bleeding.     08/12/2016 Imaging   Abdomen X-ray in the ER: Moderate colonic stool burden without evidence of enteric obstruction   11/13/2016 Imaging   TVUS was performed on 11/13/16 which showed a large hypoechoic lobular mass seen posterior to the uterus measuring 18.2x16.1x11.8cm, blood flow was seen along the periphery of the mass. It was considered to be likely to be a fibroid vs pelvic mass vs ovarian mass. Neither ovary was removed. Moderate amount of free fluid in the pelvis. THe uterus measures 4x10x4cm. The endometrium is 64m.    12/05/2016 Imaging   MR pelvis 1. Mild limitations as detailed above. 2. Heterogeneous pelvic mass is favored to arise from the right ovary. Favor a solid ovarian neoplasm such as fibroma/Brenner's tumor. Given lesion size and heterogeneous T2 signal out, complicating torsion cannot be excluded. 3. Moderate abdominopelvic ascites, without specific evidence of peritoneal metastasis. Consider further evaluation with contrast-enhanced abdominopelvic CT.    12/26/2016 Pathology Results   1. Uterus, ovaries and fallopian tubes - ADENOCARCINOMA INVOLVING RIGHT FALLOPIAN TUBE, RIGHT AND LEFT OVARIES, UTERINE SEROSA AND PELVIC MASS. - SEE ONCOLOGY TABLE AND COMMENT. - UTERINE CERVIX, ENDOMETRIUM, MYOMETRIUM AND LEFT FALLOPIAN TUBE FREE OF TUMOR. 2. Omentum, resection for tumor - ADENOCARCINOMA. Microscopic Comment 1. ONCOLOGY TABLE - FALLOPIAN TUBE. SEE COMMENT 1. Specimen, including laterality: Uterus, bilateral adnexa, pelvic mass and omentum. 2. Procedure: Hysterectomy with bilateral salpingo-oophorectomy,  pelvic mass excision and omentectomy. 3. Lymph node sampling performed: No 4. Tumor site: See comment. 5. Tumor location in fallopian tube: Right fallopian tube fimbria 6. Specimen integrity (intact/ruptured/disrupted): Intact 7. Tumor size (cm): 18 cm, see comment. 8. Histologic type: Adenocarcinoma 9. Grade: High grade 10. Microscopic tumor extension: Tumor involves right fallopian tube, right and left ovaries, uterine serosa, pelvic mass and omentum. 11. Margins: See comment. 12. Lymph-Vascular invasion: Present 13. Lymph nodes: # examined: 0; # positive: N/A 14. TNM: pT3c, pNX 15. FIGO Stage (based on pathologic findings, needs clinical correlation: III-C 16. Comments: There is an 18 cm pelvic mass which is a high grade adenocarcinoma and there is a 12.2 cm  segment of omentum which is extensively involved with adenocarcinoma. The tumor also involves the fimbria of the right fallopian tube and the parenchyma of the right and left ovaries as well as uterine serosa. The fimbria of the right fallopian has intraepithelial atypia consistent with a precursor lesion and therefore this is most consistent with primary fallopian tube adenocarcinoma. A primary peritoneal serous carcinoma is a less likely possibility.    12/26/2016 Pathology Results   PERITONEAL/ASCITIC FLUID(SPECIMEN 1 OF 1 COLLECTED 12/26/16): POORLY DIFFERENTIATED ADENOCARCINOMA   12/26/2016 Surgery   Procedure(s) Performed: Exploratory laparotomy with total abdominal hysterectomy, bilateral salpingo-oophorectomy, omentectomy radical tumor debulking for ovarian cancer .   Surgeon: EThereasa Solo MD.      Operative Findings: 20cm left ovarian mass densely adherent  to the posterior uterus, right tube and ovary, cervix, sigmoid colon. 10cm omental cake. 4L ascites. 32m size tumor nodules on the serosa of the terminal ileum and proximal sigmoid colon. 137mnodules on right diaphragm.    This represented an optimal cytoreduction (R1) with  78m66modules on intestine and diaphragm representing gross visible disease   01/16/2017 Procedure   Placement of single lumen port a cath via right internal jugular vein. The catheter tip lies at the cavo-atrial junction. A power injectable port a cath was placed and is ready for immediate use.   01/17/2017 Imaging   1. 3.5 x 7.2 x 4.6 cm fluid collection along the vaginal cuff, posterior the bladder and extending into the right adnexal space. This lesion demonstrates rim enhancement. Imaging features could be related to a loculated postoperative seroma or hematoma. Superinfection cannot be excluded by CT. Given debulking surgery was 3 weeks ago, this entire structure is un likely to represent neoplasm, but peritoneal involvement could have this appearance. 2. Small fluid collection in the left para colic gutter without rim enhancement. 3. Irregular/nodular appearance of the peritoneal M in the anatomic pelvis with areas of subtle nodularity between the stomach and the spleen. Close attention in these regions on follow-up recommended as metastatic disease is a concern. 4. Mildly enlarged hepatoduodenal ligament lymph node. Attention on follow-up recommended.   01/20/2017 Tumor Marker   [name]'s tumor was tested for the following markers: CA-125 Results of the tumor marker test revealed 46.6   01/28/2017 Genetic Testing       Negative genetic testing on the [facility].  The [facility] panel offered by [employer] sequencing and deletion/duplication testing of the following 28 genes: APC, ATM, BARD1, BMPR1A, BRCA1, BRCA2, BRIP1, CHD1, CDK4, CDKN2A, CHEK2, EPCAM (large rearrangement only), MLH1, MSH2, MSH6, MUTYH, NBN, PALB2, PMS2, PTEN, RAD51C, RAD51D, SMAD4, STK11, and TP53. Sequencing was performed for select regions of POLE and POLD1, and large rearrangement analysis was performed for select regions of GREM1. The report date is January 28, 2017.  HRD testing looking  for genomic instability and BRCA mutations was negative.  The report date of this test is January 27, 2017.    01/31/2017 Tumor Marker   [name]'s tumor was tested for the following markers: CA-125 Results of the tumor marker test revealed 22.4   03/04/2017 Tumor Marker   [name]'s tumor was tested for the following markers: CA-125 Results of the tumor marker test revealed 13.8   04/18/2017 Tumor Marker   [name]'s tumor was tested for the following markers: CA-125 Results of the tumor marker test revealed 11.9   05/05/2017 Tumor Marker   [name]'s tumor was tested for the following markers: CA-125 Results of the tumor marker test revealed 12   05/26/2017 Tumor Marker   [name]'s tumor was tested for the following markers: CA-125 Results of the tumor marker test revealed 10.6   05/26/2017 Imaging   1. A previously noted postoperative fluid collection in the low pelvis and residual ascites seen on the prior examination has resolved on today's study. No findings to suggest residual/recurrent disease on today's examination. No definite solid organ metastasis identified in the abdomen or pelvis. No lymphadenopathy. 2. Aortic atherosclerosis.   06/10/2017 Procedure   Successful right IJ vein Port-A-Cath explant.   03/09/2018 Tumor Marker   [name]'s tumor was tested for the following markers: CA-125 Results of the tumor marker test revealed 12.1   05/26/2018 Tumor Marker   [name]'s tumor was tested for  the following markers: CA-125 Results of the tumor marker test revealed 13.8   09/09/2018 Tumor Marker   [name]'s tumor was tested for the following markers: CA-125 Results of the tumor marker test revealed 23.9   12/18/2018 Tumor Marker   [name]'s tumor was tested for the following markers: CA-125 Results of the tumor marker test revealed 43.8   02/27/2019 - 02/28/2019 Hospital Admission   She was admitted for bowel obstruction   02/27/2019 Imaging   1. High-grade small bowel  obstruction with transition point in the pelvis in an area of suspected desmoplastic reaction surrounding a 1.3 cm spiculated nodule in the mesentery, suspicious for carcinoid. There is hyperenhancement near the tip of the appendix and loss of the normal fat plane between it and the adjacent sigmoid colon, potentially the site of primary lesion. 2. Multiple new small subcentimeter hyperdense lesions scattered along the liver capsule, concerning for metastases. Enlarged hyperenhancing gastrohepatic and portacaval lymph nodes concerning for nodal metastases. 3. Very mild right hydroureteronephrosis to the level of the desmoplastic reaction in the pelvis, potentially involving the right ureter. 4. Infraumbilical ventral abdominal wall diastasis containing a small nondilated portion of transverse colon. 5. Trace ascites. 6. Cholelithiasis. 7.  Aortic atherosclerosis (ICD10-I70.0).     03/05/2019 Tumor Marker   [name]'s tumor was tested for the following markers: CA-125 Results of the tumor marker test revealed 70.1   03/12/2019 Echocardiogram   IMPRESSIONS     1. Left ventricular ejection fraction, by visual estimation, is 60 to 65%. The left ventricle has normal function. There is no left ventricular hypertrophy.  2. Left ventricular diastolic parameters are consistent with Grade I diastolic dysfunction (impaired relaxation).  3. Global right ventricle has normal systolic function.The right ventricular size is normal. No increase in right ventricular wall thickness.  4. Left atrial size was normal.  5. Right atrial size was normal.  6. The pericardium was not assessed.  7. The mitral valve is normal in structure. No evidence of mitral valve regurgitation.  8. The tricuspid valve is normal in structure. Tricuspid valve regurgitation is trivial.  9. The aortic valve is normal in structure. Aortic valve regurgitation is not visualized. 10. The pulmonic valve was grossly normal. Pulmonic valve  regurgitation is not visualized. 11. The average left ventricular global longitudinal strain is -17.6 %.   03/16/2019 Procedure   Successful placement of a right internal jugular approach power injectable Port-A-Cath. The catheter is ready for immediate use.     04/19/2019 Tumor Marker   [name]'s tumor was tested for the following markers: CA-125 Results of the tumor marker test revealed 39.8   05/17/2019 Tumor Marker   [name]'s tumor was tested for the following markers: CA-125 Results of the tumor marker test revealed 27   05/28/2019 Tumor Marker   [name]'s tumor was tested for the following markers: CA-125 Results of the tumor marker test revealed 27.7.   06/11/2019 Imaging   1. No new or progressive metastatic disease in the abdomen or pelvis. 2. Tiny noncalcified perisplenic implant is decreased. Calcified pericardiophrenic, retroperitoneal and bilateral inguinal lymph nodes and scattered small calcified perihepatic and perisplenic implants are stable. 3.  Aortic Atherosclerosis (ICD10-I70.0).       06/17/2019 Echocardiogram    1. Left ventricular ejection fraction, by estimation, is 65 to 70%. The left ventricle has normal function. The left ventricle has no regional wall motion abnormalities. Left ventricular diastolic parameters were normal.  2. Right ventricular systolic function is normal. The right ventricular size is  normal.  3. The mitral valve is normal in structure and function. No evidence of mitral valve regurgitation. No evidence of mitral stenosis.  4. The aortic valve is normal in structure and function. Aortic valve regurgitation is not visualized. No aortic stenosis is present.   06/21/2019 Tumor Marker   [name]'s tumor was tested for the following markers: CA-125 Results of the tumor marker test revealed 18.7   07/19/2019 Tumor Marker   [name]'s tumor was tested for the following markers: CA-125 Results of the tumor marker test revealed 16.7   08/30/2019  Tumor Marker   [name]'s tumor was tested for the following markers: CA-125. Results of the tumor marker test revealed 13.9   09/15/2019 Echocardiogram    1. Left ventricular ejection fraction, by estimation, is 65 to 70%. The left ventricle has normal function. The left ventricle has no regional wall motion abnormalities. Left ventricular diastolic parameters are consistent with Grade I diastolic dysfunction (impaired relaxation). The average left ventricular global longitudinal strain is 18.1 %. The global longitudinal strain is normal.  2. Right ventricular systolic function is normal. The right ventricular size is normal.  3. The mitral valve is normal in structure. No evidence of mitral valve regurgitation. No evidence of mitral stenosis.  4. The aortic valve is normal in structure. Aortic valve regurgitation is not visualized. No aortic stenosis is present.  5. The inferior vena cava is normal in size with greater than 50% respiratory variability, suggesting right atrial pressure of 3 mmHg.     09/24/2019 Imaging   1. Stable exam. No new or progressive metastatic disease on today's study. 2. Small subcapsular hypodensity in the lateral spleen, new in the interval, but indeterminate. Attention on follow-up recommended. 3. The calcified upper abdominal and groin lymph nodes are stable as are the scattered calcified and noncalcified peritoneal implants. 4. Stable midline ventral hernia containing a short segment of colon without complicating features. 5. Aortic Atherosclerosis (ICD10-I70.0).     10/04/2019 - 09/21/2021 Chemotherapy   [name] is on Treatment Plan : ovarian Bevacizumab q21d     10/04/2019 Tumor Marker   [name]'s tumor was tested for the following markers: CA-125. Results of the tumor marker test revealed 12.9.   11/01/2019 Tumor Marker   [name]'s tumor was tested for the following markers: CA-125 Results of the tumor marker test revealed 15.4   12/13/2019 Tumor Marker    [name]'s tumor was tested for the following markers: CA-125 Results of the tumor marker test revealed 14.8   12/31/2019 Imaging   1. Unchanged tiny peritoneal nodule adjacent to the spleen measuring 6 mm. Other previously noted peritoneal nodules are imperceptible on present examination. 2. Unchanged prominent, partially calcified portacaval lymph node and bilateral inguinal lymph nodes. 3. Unchanged subtle, nonspecific subcapsular lesion of the spleen.  4. No evidence of new metastatic disease in the abdomen or pelvis. 5. Status post hysterectomy. 6. Unchanged low midline ventral abdominal hernia containing a single loop of nonobstructed transverse colon. 7. Cholelithiasis. 8. Aortic Atherosclerosis (ICD10-I70.0).       01/03/2020 Tumor Marker   [name]'s tumor was tested for the following markers: CA-125 Results of the tumor marker test revealed 11.7.   01/24/2020 Tumor Marker   [name]'s tumor was tested for the following markers: CA-125. Results of the tumor marker test revealed 15.1   03/06/2020 Tumor Marker   [name]'s tumor was tested for the following markers: CA-125. Results of the tumor marker test revealed 20.3   03/27/2020 Tumor Marker   [name]'s tumor  was tested for the following markers: CA-125 Results of the tumor marker test revealed 23.7   05/11/2020 Tumor Marker   [name]'s tumor was tested for the following markers: CA-125. Results of the tumor marker test revealed 25.4   06/23/2020 Imaging   1. Cholelithiasis with gallbladder wall thickening and mild infiltrative edema in the porta hepatis, raising the possibility of acute cholecystitis. There is truncation of the common bile duct distally and distal choledocholithiasis is difficult to exclude. Correlate with bilirubin levels. If clinically warranted, MRCP could be utilized for further characterization. 2. Stable tiny perisplenic nodule. 3. Other imaging findings of potential clinical significance: Small type 1  hiatal hernia. Prominent stool throughout the colon favors constipation. Ventral infraumbilical hernia contains transverse colon without findings of strangulation or obstruction. Small supraumbilical hernia contains adipose tissue. Lumbar degenerative disc disease at L5-S1. Stable tiny nodule along the lateral margin of the spleen, nonspecific. 4. Aortic atherosclerosis.     12/15/2020 Imaging   No evidence of recurrent or metastatic carcinoma within the abdomen or pelvis.   Stable ventral hernia containing transverse colon. No evidence of bowel obstruction or strangulation.   Cholelithiasis. No radiographic evidence of cholecystitis.   Tiny hiatal hernia.   Aortic Atherosclerosis (ICD10-I70.0).     06/11/2021 Imaging   1. Numerous sub 4 mm pulmonary nodules in the lungs some of which have a cavitary appearance. I think it is unlikely this is metastatic disease and more likely a possible immunotherapy related process such as a sarcoid like reaction. Recommend full chest CT further evaluation. 2. Stable small calcified retroperitoneal and mesenteric lymph nodes. No findings suspicious for recurrent peritoneal carcinomatosis. 3. Stable lower abdominal wall hernia containing part of the transverse colon. 4. Cholelithiasis.   09/27/2021 Imaging   1. Numerous small pulmonary nodules throughout the lungs, the majority of which are cavitary. These are all subcentimeter, however nodules that were seen in the bilateral lung bases on prior CT dated 06/08/2021 appear slightly increased in size and number. Behavior is highly suspicious for pulmonary metastatic disease despite the unusual appearance, however as previously reported general differential considerations include atypical infection and inflammation, such as drug-induced sarcoidosis like reaction.  2. Unchanged calcified lymph nodes in the abdomen and pelvis, consistent with treated nodal metastases. 3. Cholelithiasis with wall thickening and  pericholecystic fluid. This appearance is similar to prior examination and suggests chronic cholecystitis. Correlate for clinical symptoms. Mild intrahepatic biliary ductal dilatation, similar to prior examination. 4. Midline ventral hernia containing a single nonobstructed loop of mid transverse colon.     12/25/2021 Imaging   1. Again noted are multiple thin walled cavitary lung nodules throughout both lungs. These are similar in size and multiplicity when compared with the exam from 09/26/2021. Etiology remains indeterminate, although metastatic disease cannot be excluded with a high degree of certainty. 2. Stable calcified upper abdominal lymph nodes consistent with treated nodal metastases. 3. Gallstones. 4. Aortic Atherosclerosis (ICD10-I70.0).   01/30/2022 Tumor Marker   [name]'s tumor was tested for the following markers: CA-125. Results of the tumor marker test revealed 59.3.   01/31/2022 Imaging   1. No substantial interval change in exam. 2. Innumerable tiny cavitary pulmonary nodules in the lung bases. As noted previously, metastatic disease would be distinct consideration although imaging features are nonspecific and infectious/inflammatory etiology is not excluded. 3. Calcified nodal disease in the upper abdomen and pelvis shows no substantial interval change. Scattered noncalcified nodules in the mesentery are similar to prior. 4. Markedly large stool volume throughout  the colon. Imaging features suggestive of clinical constipation. 5. Cholelithiasis with ill-defined appearance of the gallbladder wall. Appearance is stable in the interval. 6. Trace free fluid in the cul-de-sac. 7. Midline ventral hernia contains a short segment of transverse colon without complicating features. 8. Aortic Atherosclerosis (ICD10-I70.0).     PHYSICAL EXAMINATION: ECOG PERFORMANCE STATUS: 1 - Symptomatic but completely ambulatory  Vitals:   02/11/22 0957  BP: (!) 176/7  Pulse: (!) 54   Resp: 18  SpO2: 100%   Filed Weights   02/11/22 0957  Weight: 150 lb (68 kg)    GENERAL:alert, no distress and comfortable SKIN: His skin rash has improved.  She has persistent dry skin NEURO: alert & oriented x 3 with fluent speech, no focal motor/sensory deficits  LABORATORY DATA:  I have reviewed the data as listed    Component Value Date/Time   NA 137 01/29/2022 1411   NA 139 04/14/2017 0742   K 4.4 01/29/2022 1411   K 4.0 04/14/2017 0742   CL 107 01/29/2022 1411   CO2 27 01/29/2022 1411   CO2 21 (L) 04/14/2017 0742   GLUCOSE 110 (H) 01/29/2022 1411   GLUCOSE 247 (H) 04/14/2017 0742   BUN 28 (H) 01/29/2022 1411   BUN 13.1 04/14/2017 0742   CREATININE 1.36 (H) 01/29/2022 1411   CREATININE 0.99 03/12/2021 0832   CREATININE 0.9 04/14/2017 0742   CALCIUM 8.7 (L) 01/29/2022 1411   CALCIUM 9.2 04/14/2017 0742   PROT 6.0 (L) 01/29/2022 1411   PROT 7.3 04/14/2017 0742   ALBUMIN 3.5 01/29/2022 1411   ALBUMIN 3.9 04/14/2017 0742   AST 13 (L) 01/29/2022 1411   AST 18 03/12/2021 0832   AST 17 04/14/2017 0742   ALT 6 01/29/2022 1411   ALT 9 03/12/2021 0832   ALT 14 04/14/2017 0742   ALKPHOS 50 01/29/2022 1411   ALKPHOS 70 04/14/2017 0742   BILITOT 0.4 01/29/2022 1411   BILITOT 0.8 03/12/2021 0832   BILITOT 0.37 04/14/2017 0742   GFRNONAA 43 (L) 01/29/2022 1411   GFRNONAA >60 03/12/2021 0832   GFRAA >60 01/24/2020 0924   GFRAA >60 07/19/2019 0951    No results found for: "SPEP", "UPEP"  Lab Results  Component Value Date   WBC 5.9 01/29/2022   NEUTROABS 3.8 01/29/2022   HGB 11.3 (L) 01/29/2022   HCT 34.0 (L) 01/29/2022   MCV 93.2 01/29/2022   PLT 160 01/29/2022      Chemistry      Component Value Date/Time   NA 137 01/29/2022 1411   NA 139 04/14/2017 0742   K 4.4 01/29/2022 1411   K 4.0 04/14/2017 0742   CL 107 01/29/2022 1411   CO2 27 01/29/2022 1411   CO2 21 (L) 04/14/2017 0742   BUN 28 (H) 01/29/2022 1411   BUN 13.1 04/14/2017 0742   CREATININE  1.36 (H) 01/29/2022 1411   CREATININE 0.99 03/12/2021 0832   CREATININE 0.9 04/14/2017 0742      Component Value Date/Time   CALCIUM 8.7 (L) 01/29/2022 1411   CALCIUM 9.2 04/14/2017 0742   ALKPHOS 50 01/29/2022 1411   ALKPHOS 70 04/14/2017 0742   AST 13 (L) 01/29/2022 1411   AST 18 03/12/2021 0832   AST 17 04/14/2017 0742   ALT 6 01/29/2022 1411   ALT 9 03/12/2021 0832   ALT 14 04/14/2017 0742   BILITOT 0.4 01/29/2022 1411   BILITOT 0.8 03/12/2021 0832   BILITOT 0.37 04/14/2017 0742       RADIOGRAPHIC  STUDIES: I have personally reviewed the radiological images as listed and agreed with the findings in the report. CT ABDOMEN PELVIS W CONTRAST  Result Date: 01/31/2022 CLINICAL DATA:  Ovarian cancer.  Restaging.  * Tracking Code: BO * EXAM: CT ABDOMEN AND PELVIS WITH CONTRAST TECHNIQUE: Multidetector CT imaging of the abdomen and pelvis was performed using the standard protocol following bolus administration of intravenous contrast. RADIATION DOSE REDUCTION: This exam was performed according to the departmental dose-optimization program which includes automated exposure control, adjustment of the mA and/or kV according to [name] size and/or use of iterative reconstruction technique. CONTRAST:  34m OMNIPAQUE IOHEXOL 300 MG/ML  SOLN COMPARISON:  09/26/2021 FINDINGS: Lower chest: Innumerable tiny cavitary pulmonary nodules are again identified in the lung bases. Hepatobiliary: No suspicious focal abnormality within the liver parenchyma. Gallbladder is nondistended with ill-defined appearance of the gallbladder wall. 8 mm calcified gallstone evident. No intrahepatic or extrahepatic biliary dilation. Pancreas: No focal mass lesion. No dilatation of the main duct. No intraparenchymal cyst. No peripancreatic edema. Spleen: No splenomegaly. No focal mass lesion. Adrenals/Urinary Tract: No adrenal nodule or mass. Kidneys unremarkable. No evidence for hydroureter. The urinary bladder appears normal  for the degree of distention. Stomach/Bowel: Tiny hiatal hernia. Stomach is unremarkable. No gastric wall thickening. No evidence of outlet obstruction. Duodenum is normally positioned as is the ligament of Treitz. No small bowel wall thickening. No small bowel dilatation. The terminal ileum is normal. The appendix is normal. Markedly large stool volume noted throughout the colon. Vascular/Lymphatic: There is advanced atherosclerotic calcification of the abdominal aorta without aneurysm. 9 mm short axis calcified hepatoduodenal ligament lymph node on 25/2 is stable. Similar appearance calcified 10 mm short axis gastrohepatic ligament lymph node on 23/2 with additional calcified nodal disease seen along the gastric antrum in the porta hepatis. Noncalcified nodules are seen in the central small bowel mesentery (see image 40/2) similar to prior. Soft tissue nodules in the IMA distribution of the central pelvis are not substantially changed in the interval. Calcified mildly enlarged lymph nodes in the groin regions bilaterally are stable. Reproductive: Uterus surgically absent.  There is no adnexal mass. Other: Trace free fluid is seen in the cul-de-sac (75/2). Musculoskeletal: No worrisome lytic or sclerotic osseous abnormality. Ventral hernia contains short segment of transverse colon without complicating features. IMPRESSION: 1. No substantial interval change in exam. 2. Innumerable tiny cavitary pulmonary nodules in the lung bases. As noted previously, metastatic disease would be distinct consideration although imaging features are nonspecific and infectious/inflammatory etiology is not excluded. 3. Calcified nodal disease in the upper abdomen and pelvis shows no substantial interval change. Scattered noncalcified nodules in the mesentery are similar to prior. 4. Markedly large stool volume throughout the colon. Imaging features suggestive of clinical constipation. 5. Cholelithiasis with ill-defined appearance of  the gallbladder wall. Appearance is stable in the interval. 6. Trace free fluid in the cul-de-sac. 7. Midline ventral hernia contains a short segment of transverse colon without complicating features. 8. Aortic Atherosclerosis (ICD10-I70.0). Electronically Signed   By: EMisty StanleyM.D.   On: 01/31/2022 09:32

## 2022-02-11 NOTE — Assessment & Plan Note (Signed)
She is not symptomatic from GYN cancer perspective Her appointment to see pulmonologist this next month I will see her again next month for further follow-up on the skin rash

## 2022-02-14 ENCOUNTER — Other Ambulatory Visit: Payer: Self-pay | Admitting: Hematology and Oncology

## 2022-02-14 ENCOUNTER — Telehealth: Payer: Self-pay

## 2022-02-14 DIAGNOSIS — K8020 Calculus of gallbladder without cholecystitis without obstruction: Secondary | ICD-10-CM

## 2022-02-14 NOTE — Telephone Encounter (Signed)
I created referral to surgeons at [facility]

## 2022-02-14 NOTE — Telephone Encounter (Signed)
Returned her call. She is at the beach and will be back in town on 11/7. Earlier she was having pain in the right upper abdomen and pain between her shoulder blades. Sometimes the pain is after she eats something with fat. She took tylenol and the pain is better now.  She is asking if Dr. Alvy Bimler can send a referral to the surgeon for her gallbladder.

## 2022-02-23 ENCOUNTER — Encounter: Payer: Self-pay | Admitting: Hematology and Oncology

## 2022-02-27 ENCOUNTER — Ambulatory Visit (INDEPENDENT_AMBULATORY_CARE_PROVIDER_SITE_OTHER): Payer: Medicare Other | Admitting: Surgery

## 2022-02-27 ENCOUNTER — Institutional Professional Consult (permissible substitution): Payer: Medicare Other | Admitting: Pulmonary Disease

## 2022-02-27 ENCOUNTER — Encounter: Payer: Self-pay | Admitting: Surgery

## 2022-02-27 VITALS — BP 142/76 | HR 55 | Temp 98.0°F | Ht 65.0 in | Wt 155.0 lb

## 2022-02-27 DIAGNOSIS — K8020 Calculus of gallbladder without cholecystitis without obstruction: Secondary | ICD-10-CM | POA: Diagnosis not present

## 2022-02-27 NOTE — Progress Notes (Signed)
02/27/2022  Reason for Visit:  symptomatic cholelithiasis  Requesting Provider:  Heath Lark, MD  History of Present Illness: Holly [name] is a 66 y.o. female presenting for evaluation of symptomatic cholelithiasis.  The patient has a history of right fallopian tube carcinoma requiring hysterectomy, bilateral salpingo-oophorectomy and tumor debulking with omentectomy in 2018.  She has undergone chemotherapy as well and currently she is not on any treatments.  During her follow-up imaging, she has been noted to have cholelithiasis.  The patient reports that initially it was not bothering her at all and now over the past 3 to 4 months she has noticed progressing symptoms to the point that now she reports that almost daily episodes of at least some soreness in the upper area of her abdomen and right upper quadrant.  Denies any specific nausea or vomiting but the main symptom is pain.  With these progressive symptoms, she was referred to [place] for further evaluation.  Patient denies any fevers, chills, chest pain, shortness of breath.  The pain that she has is in the epigastric and right upper quadrant areas and they radiate towards the back.  Denies any other areas of abdominal pain.  She does endorse constipation issues and has been noted multiple times on her imaging studies.  More recently, she has developed itching and a rash in her body and is currently taking prednisone currently at 10 mg daily dose.  She reports that the itching is much improved but the rash has not resolved yet.  Past Medical History: Past Medical History:  Diagnosis Date   Complication of anesthesia    slow to wake up sometimes    GERD (gastroesophageal reflux disease)    History of chemotherapy    History of hiatal hernia    Hypertension    Ovarian cancer ([place]) dx'd 12/2016   recurrent dz 02/2019   PONV (postoperative nausea and vomiting)      Past Surgical History: Past Surgical History:  Procedure Laterality Date    DEBULKING N/A 12/26/2016   Procedure: RADICAL TUMOR DEBULKING; OMENTECTOMY;  Surgeon: Everitt Amber, MD;  Location: [facility];  Service: Gynecology;  Laterality: N/A;   ganglion cyst removed     Hital Hernia     IR FLUORO GUIDE PORT INSERTION RIGHT  01/16/2017   IR IMAGING GUIDED PORT INSERTION  03/16/2019   IR REMOVAL TUN ACCESS W/ PORT W/O FL MOD SED  06/10/2017   IR US GUIDE VASC ACCESS RIGHT  01/16/2017   LAPAROTOMY N/A 12/26/2016   Procedure: EXPLORATORY LAPAROTOMY;  Surgeon: Everitt Amber, MD;  Location: [facility];  Service: Gynecology;  Laterality: N/A;   left morton's neuroma surgery      Nissan Fundoplication      Home Medications: Prior to Admission medications   Medication Sig Start Date End Date Taking? Authorizing Provider  acetaminophen (TYLENOL) 325 MG tablet Take 650 mg by mouth every 6 (six) hours as needed for headache (pain).   Yes [provider]  atenolol (TENORMIN) 25 MG tablet TAKE 1 TABLET BY MOUTH EVERY DAY 02/04/22  Yes Gorsuch, Ni, MD  cloNIDine (CATAPRES) 0.1 MG tablet TAKE 1 TABLET BY MOUTH EVERY DAY 02/04/22  Yes Gorsuch, Ni, MD  doxazosin (CARDURA) 4 MG tablet TAKE 1 TABLET BY MOUTH EVERY DAY 12/28/21  Yes Gorsuch, Ni, MD  hydrochlorothiazide (HYDRODIURIL) 25 MG tablet TAKE 1 TABLET (25 MG TOTAL) BY MOUTH DAILY. 02/04/22  Yes Gorsuch, Ni, MD  lidocaine-prilocaine (EMLA) cream Apply to affected area once daily as  directed. Patient taking differently: 1 application  as needed (access port). Apply to affected area once daily as directed. 09/25/20  Yes Gorsuch, Ni, MD  lisinopril (ZESTRIL) 20 MG tablet TAKE 1 TABLET BY MOUTH EVERY DAY IN THE EVENING 02/04/22  Yes Gorsuch, Ni, MD  predniSONE (DELTASONE) 20 MG tablet Take 1 tablet (20 mg total) by mouth daily with breakfast. Taper as directed 02/11/22  Yes Gorsuch, Ni, MD  venlafaxine XR (EFFEXOR-XR) 37.5 MG 24 hr capsule TAKE 1 CAPSULE BY MOUTH DAILY WITH BREAKFAST 06/21/21  Yes Heath Lark, MD    Allergies: Allergies   Allergen Reactions   Dilaudid [Hydromorphone Hcl] Nausea And Vomiting   Morphine And Related Nausea Only   Sudafed [Pseudoephedrine Hcl] Other (See Comments)    Nervous/jittery    Social History:  reports that she has never smoked. She has been exposed to tobacco smoke. She has never used smokeless tobacco. She reports that she does not drink alcohol and does not use drugs.   Family History: Family History  Problem Relation Age of Onset   Diabetes Mother    Diabetes Father    Hypertension Father    Stroke Father    COPD Father    Heart attack Sister 26   Dementia Maternal Aunt    Bone cancer Paternal Uncle        deceased at 25   Dementia Maternal Grandmother    Heart attack Paternal Grandmother    Heart attack Paternal Grandfather    Healthy Son    Healthy Son     Review of Systems: Review of Systems  Constitutional:  Negative for chills and fever.  HENT:  Negative for hearing loss.   Respiratory:  Negative for shortness of breath.   Cardiovascular:  Negative for chest pain.  Gastrointestinal:  Positive for abdominal pain and constipation. Negative for nausea and vomiting.  Genitourinary:  Negative for dysuria.  Musculoskeletal:  Negative for myalgias.  Skin:  Positive for rash.  Neurological:  Negative for dizziness.  Psychiatric/Behavioral:  Negative for depression.     Physical Exam BP (!) 142/76   Pulse (!) 55   Temp 98 F (36.7 C)   Ht '5\' 5"'$  (1.651 m)   Wt 155 lb (70.3 kg)   SpO2 98%   BMI 25.79 kg/m  CONSTITUTIONAL: No acute distress, well-nourished HEENT:  Normocephalic, atraumatic, extraocular motion intact. NECK: Trachea is midline, and there is no jugular venous distension.  RESPIRATORY:  Lungs are clear, and breath sounds are equal bilaterally. Normal respiratory effort without pathologic use of accessory muscles. CARDIOVASCULAR: Heart is regular without murmurs, gallops, or rubs. GI: The abdomen is soft, nondistended, with some soreness in the  right upper quadrant.  Negative Murphy sign.  The patient has an incisional hernia in the lower abdomen that is about 5 cm in length.  Otherwise her lower abdominal incision is well-healed.  MUSCULOSKELETAL:  Normal muscle strength and tone in all four extremities.  No peripheral edema or cyanosis. SKIN: Skin turgor is normal. There are no pathologic skin lesions.  NEUROLOGIC:  Motor and sensation is grossly normal.  Cranial nerves are grossly intact. PSYCH:  Alert and oriented to person, place and time. Affect is normal.  Laboratory Analysis: Labs from 01/29/2022: Sodium 137, potassium 4.4, chloride 107, CO2 27, glucose 110, BUN 28, creatinine 1.36.  Total bilirubin 0.4, AST 13, ALT 6, alkaline phosphatase 50, albumin 3.5.  WBC 5.9, hemoglobin 11.3, hematocrit 34, platelets 160.  Imaging: CT abdomen pelvis on 01/30/2022: IMPRESSION:  1. No substantial interval change in exam. 2. Innumerable tiny cavitary pulmonary nodules in the lung bases. As noted previously, metastatic disease would be distinct consideration although imaging features are nonspecific and infectious/inflammatory etiology is not excluded. 3. Calcified nodal disease in the upper abdomen and pelvis shows no substantial interval change. Scattered noncalcified nodules in the mesentery are similar to prior. 4. Markedly large stool volume throughout the colon. Imaging features suggestive of clinical constipation. 5. Cholelithiasis with ill-defined appearance of the gallbladder wall. Appearance is stable in the interval. 6. Trace free fluid in the cul-de-sac. 7. Midline ventral hernia contains a short segment of transverse colon without complicating features. 8. Aortic Atherosclerosis (ICD10-I70.0).  Assessment and Plan: This is a 66 y.o. female with symptomatic cholelithiasis.  - Discussed with the patient the findings on her CT scan images and how these relate to her symptoms of biliary colic.  This patient has been  having progressive issues with now almost daily episodes although they are minor.  The patient is ready to do something about this.  Discussed with her that although we may have an option for a low-fat diet and conservative measures, given that her symptoms have been progressing, I do not know that this alone would be helpful.  The patient as such is more interested in surgical definitive management. - Discussed with her the plan then for a robotic assisted cholecystectomy and reviewed the surgery at length with her including the incisions, the risks of bleeding, infection, injury to surrounding structures, the use of ICG to evaluate the biliary anatomy, that this would be an outpatient procedure, postoperative activity restrictions, pain control, and she is willing to proceed.  The patient asked about her lower abdominal hernia and given that we are doing a cholecystectomy, we will prefer not to repair her hernia at the same time given that she would need mesh for reinforcement.  Discussed with her that once she is recovered from the cholecystectomy, we would be able to discuss further surgery for repair of her incisional hernia.  She is in agreement with this plan. - We will schedule the patient for surgery on 03/12/2022.  She has an appointment with Dr. Alvy Bimler tomorrow.  Discussed with the patient that although it would be more ideal to be off prednisone for surgery in the terms of wound healing, given the progression of her symptoms, it would be okay to do even with prednisone on board.  I spent 55 minutes dedicated to the care of this patient on the date of this encounter to include pre-visit review of records, face-to-face time with the patient discussing diagnosis and management, and any post-visit coordination of care.   Melvyn Neth, [facility]

## 2022-02-27 NOTE — H&P (View-Only) (Signed)
02/27/2022  Reason for Visit:  symptomatic cholelithiasis  Requesting Provider:  Heath Lark, MD  History of Present Illness: Holly [name] is a 66 y.o. female presenting for evaluation of symptomatic cholelithiasis.  The patient has a history of right fallopian tube carcinoma requiring hysterectomy, bilateral salpingo-oophorectomy and tumor debulking with omentectomy in 2018.  She has undergone chemotherapy as well and currently she is not on any treatments.  During her follow-up imaging, she has been noted to have cholelithiasis.  The patient reports that initially it was not bothering her at all and now over the past 3 to 4 months she has noticed progressing symptoms to the point that now she reports that almost daily episodes of at least some soreness in the upper area of her abdomen and right upper quadrant.  Denies any specific nausea or vomiting but the main symptom is pain.  With these progressive symptoms, she was referred to [place] for further evaluation.  Patient denies any fevers, chills, chest pain, shortness of breath.  The pain that she has is in the epigastric and right upper quadrant areas and they radiate towards the back.  Denies any other areas of abdominal pain.  She does endorse constipation issues and has been noted multiple times on her imaging studies.  More recently, she has developed itching and a rash in her body and is currently taking prednisone currently at 10 mg daily dose.  She reports that the itching is much improved but the rash has not resolved yet.  Past Medical History: Past Medical History:  Diagnosis Date   Complication of anesthesia    slow to wake up sometimes    GERD (gastroesophageal reflux disease)    History of chemotherapy    History of hiatal hernia    Hypertension    Ovarian cancer ([place]) dx'd 12/2016   recurrent dz 02/2019   PONV (postoperative nausea and vomiting)      Past Surgical History: Past Surgical History:  Procedure Laterality Date    DEBULKING N/A 12/26/2016   Procedure: RADICAL TUMOR DEBULKING; OMENTECTOMY;  Surgeon: Everitt Amber, MD;  Location: [facility];  Service: Gynecology;  Laterality: N/A;   ganglion cyst removed     Hital Hernia     IR FLUORO GUIDE PORT INSERTION RIGHT  01/16/2017   IR IMAGING GUIDED PORT INSERTION  03/16/2019   IR REMOVAL TUN ACCESS W/ PORT W/O FL MOD SED  06/10/2017   IR US GUIDE VASC ACCESS RIGHT  01/16/2017   LAPAROTOMY N/A 12/26/2016   Procedure: EXPLORATORY LAPAROTOMY;  Surgeon: Everitt Amber, MD;  Location: [facility];  Service: Gynecology;  Laterality: N/A;   left morton's neuroma surgery      Nissan Fundoplication      Home Medications: Prior to Admission medications   Medication Sig Start Date End Date Taking? Authorizing Provider  acetaminophen (TYLENOL) 325 MG tablet Take 650 mg by mouth every 6 (six) hours as needed for headache (pain).   Yes [provider]  atenolol (TENORMIN) 25 MG tablet TAKE 1 TABLET BY MOUTH EVERY DAY 02/04/22  Yes Gorsuch, Ni, MD  cloNIDine (CATAPRES) 0.1 MG tablet TAKE 1 TABLET BY MOUTH EVERY DAY 02/04/22  Yes Gorsuch, Ni, MD  doxazosin (CARDURA) 4 MG tablet TAKE 1 TABLET BY MOUTH EVERY DAY 12/28/21  Yes Gorsuch, Ni, MD  hydrochlorothiazide (HYDRODIURIL) 25 MG tablet TAKE 1 TABLET (25 MG TOTAL) BY MOUTH DAILY. 02/04/22  Yes Gorsuch, Ni, MD  lidocaine-prilocaine (EMLA) cream Apply to affected area once daily as  directed. Patient taking differently: 1 application  as needed (access port). Apply to affected area once daily as directed. 09/25/20  Yes Gorsuch, Ni, MD  lisinopril (ZESTRIL) 20 MG tablet TAKE 1 TABLET BY MOUTH EVERY DAY IN THE EVENING 02/04/22  Yes Gorsuch, Ni, MD  predniSONE (DELTASONE) 20 MG tablet Take 1 tablet (20 mg total) by mouth daily with breakfast. Taper as directed 02/11/22  Yes Gorsuch, Ni, MD  venlafaxine XR (EFFEXOR-XR) 37.5 MG 24 hr capsule TAKE 1 CAPSULE BY MOUTH DAILY WITH BREAKFAST 06/21/21  Yes Heath Lark, MD    Allergies: Allergies   Allergen Reactions   Dilaudid [Hydromorphone Hcl] Nausea And Vomiting   Morphine And Related Nausea Only   Sudafed [Pseudoephedrine Hcl] Other (See Comments)    Nervous/jittery    Social History:  reports that she has never smoked. She has been exposed to tobacco smoke. She has never used smokeless tobacco. She reports that she does not drink alcohol and does not use drugs.   Family History: Family History  Problem Relation Age of Onset   Diabetes Mother    Diabetes Father    Hypertension Father    Stroke Father    COPD Father    Heart attack Sister 75   Dementia Maternal Aunt    Bone cancer Paternal Uncle        deceased at 19   Dementia Maternal Grandmother    Heart attack Paternal Grandmother    Heart attack Paternal Grandfather    Healthy Son    Healthy Son     Review of Systems: Review of Systems  Constitutional:  Negative for chills and fever.  HENT:  Negative for hearing loss.   Respiratory:  Negative for shortness of breath.   Cardiovascular:  Negative for chest pain.  Gastrointestinal:  Positive for abdominal pain and constipation. Negative for nausea and vomiting.  Genitourinary:  Negative for dysuria.  Musculoskeletal:  Negative for myalgias.  Skin:  Positive for rash.  Neurological:  Negative for dizziness.  Psychiatric/Behavioral:  Negative for depression.     Physical Exam BP (!) 142/76   Pulse (!) 55   Temp 98 F (36.7 C)   Ht '5\' 5"'$  (1.651 m)   Wt 155 lb (70.3 kg)   SpO2 98%   BMI 25.79 kg/m  CONSTITUTIONAL: No acute distress, well-nourished HEENT:  Normocephalic, atraumatic, extraocular motion intact. NECK: Trachea is midline, and there is no jugular venous distension.  RESPIRATORY:  Lungs are clear, and breath sounds are equal bilaterally. Normal respiratory effort without pathologic use of accessory muscles. CARDIOVASCULAR: Heart is regular without murmurs, gallops, or rubs. GI: The abdomen is soft, nondistended, with some soreness in the  right upper quadrant.  Negative Murphy sign.  The patient has an incisional hernia in the lower abdomen that is about 5 cm in length.  Otherwise her lower abdominal incision is well-healed.  MUSCULOSKELETAL:  Normal muscle strength and tone in all four extremities.  No peripheral edema or cyanosis. SKIN: Skin turgor is normal. There are no pathologic skin lesions.  NEUROLOGIC:  Motor and sensation is grossly normal.  Cranial nerves are grossly intact. PSYCH:  Alert and oriented to person, place and time. Affect is normal.  Laboratory Analysis: Labs from 01/29/2022: Sodium 137, potassium 4.4, chloride 107, CO2 27, glucose 110, BUN 28, creatinine 1.36.  Total bilirubin 0.4, AST 13, ALT 6, alkaline phosphatase 50, albumin 3.5.  WBC 5.9, hemoglobin 11.3, hematocrit 34, platelets 160.  Imaging: CT abdomen pelvis on 01/30/2022: IMPRESSION:  1. No substantial interval change in exam. 2. Innumerable tiny cavitary pulmonary nodules in the lung bases. As noted previously, metastatic disease would be distinct consideration although imaging features are nonspecific and infectious/inflammatory etiology is not excluded. 3. Calcified nodal disease in the upper abdomen and pelvis shows no substantial interval change. Scattered noncalcified nodules in the mesentery are similar to prior. 4. Markedly large stool volume throughout the colon. Imaging features suggestive of clinical constipation. 5. Cholelithiasis with ill-defined appearance of the gallbladder wall. Appearance is stable in the interval. 6. Trace free fluid in the cul-de-sac. 7. Midline ventral hernia contains a short segment of transverse colon without complicating features. 8. Aortic Atherosclerosis (ICD10-I70.0).  Assessment and Plan: This is a 66 y.o. female with symptomatic cholelithiasis.  - Discussed with the patient the findings on her CT scan images and how these relate to her symptoms of biliary colic.  This patient has been  having progressive issues with now almost daily episodes although they are minor.  The patient is ready to do something about this.  Discussed with her that although we may have an option for a low-fat diet and conservative measures, given that her symptoms have been progressing, I do not know that this alone would be helpful.  The patient as such is more interested in surgical definitive management. - Discussed with her the plan then for a robotic assisted cholecystectomy and reviewed the surgery at length with her including the incisions, the risks of bleeding, infection, injury to surrounding structures, the use of ICG to evaluate the biliary anatomy, that this would be an outpatient procedure, postoperative activity restrictions, pain control, and she is willing to proceed.  The patient asked about her lower abdominal hernia and given that we are doing a cholecystectomy, we will prefer not to repair her hernia at the same time given that she would need mesh for reinforcement.  Discussed with her that once she is recovered from the cholecystectomy, we would be able to discuss further surgery for repair of her incisional hernia.  She is in agreement with this plan. - We will schedule the patient for surgery on 03/12/2022.  She has an appointment with Dr. Alvy Bimler tomorrow.  Discussed with the patient that although it would be more ideal to be off prednisone for surgery in the terms of wound healing, given the progression of her symptoms, it would be okay to do even with prednisone on board.  I spent 55 minutes dedicated to the care of this patient on the date of this encounter to include pre-visit review of records, face-to-face time with the patient discussing diagnosis and management, and any post-visit coordination of care.   Melvyn Neth, [facility]

## 2022-02-27 NOTE — Patient Instructions (Addendum)
You have requested to have your gallbladder removed. This will be done at [facility] with Dr. Hampton Abbot. We are looking at doing this on November 21st.   You will most likely be out of work 1-2 weeks for this surgery.  If you have FMLA or disability paperwork that needs filled out you may drop this off at our office or this can be faxed to [phone].  You will return after your post-op appointment with a lifting restriction for approximately 4 more weeks.  You will be able to eat anything you would like to following surgery. But, start by eating a bland diet and advance this as tolerated. The Gallbladder diet is below, please go as closely by this diet as possible prior to surgery to avoid any further attacks.  Please see the (blue)pre-care form that you have been given today. Our surgery scheduler will call you to verify surgery date and to go over information.   If you have any questions, please call our office.  Laparoscopic Cholecystectomy Laparoscopic cholecystectomy is surgery to remove the gallbladder. The gallbladder is located in the upper right part of the abdomen, behind the liver. It is a storage sac for bile, which is produced in the liver. Bile aids in the digestion and absorption of fats. Cholecystectomy is often done for inflammation of the gallbladder (cholecystitis). This condition is usually caused by a buildup of gallstones (cholelithiasis) in the gallbladder. Gallstones can block the flow of bile, and that can result in inflammation and pain. In severe cases, emergency surgery may be required. If emergency surgery is not required, you will have time to prepare for the procedure. Laparoscopic surgery is an alternative to open surgery. Laparoscopic surgery has a shorter recovery time. Your common bile duct may also need to be examined during the procedure. If stones are found in the common bile duct, they may be removed. LET [facility] CARE PROVIDER KNOW ABOUT: Any  allergies you have. All medicines you are taking, including vitamins, herbs, eye drops, creams, and over-the-counter medicines. Previous problems you or members of your family have had with the use of anesthetics. Any blood disorders you have. Previous surgeries you have had.  Any medical conditions you have. RISKS AND COMPLICATIONS Generally, this is a safe procedure. However, problems may occur, including: Infection. Bleeding. Allergic reactions to medicines. Damage to other structures or organs. A stone remaining in the common bile duct. A bile leak from the cyst duct that is clipped when your gallbladder is removed. The need to convert to open surgery, which requires a larger incision in the abdomen. This may be necessary if your surgeon thinks that it is not safe to continue with a laparoscopic procedure. BEFORE THE PROCEDURE Ask your health care provider about: Changing or stopping your regular medicines. This is especially important if you are taking diabetes medicines or blood thinners. Taking medicines such as aspirin and ibuprofen. These medicines can thin your blood. Do not take these medicines before your procedure if your health care provider instructs you not to. Follow instructions from your health care provider about eating or drinking restrictions. Let your health care provider know if you develop a cold or an infection before surgery. Plan to have someone take you home after the procedure. Ask your health care provider how your surgical site will be marked or identified. You may be given antibiotic medicine to help prevent infection. PROCEDURE To reduce your risk of infection: Your health care team will wash or sanitize their  hands. Your skin will be washed with soap. An IV tube may be inserted into one of your veins. You will be given a medicine to make you fall asleep (general anesthetic). A breathing tube will be placed in your mouth. The surgeon will make several  small cuts (incisions) in your abdomen. A thin, lighted tube (laparoscope) that has a tiny camera on the end will be inserted through one of the small incisions. The camera on the laparoscope will send a picture to a TV screen (monitor) in the operating room. This will give the surgeon a good view inside your abdomen. A gas will be pumped into your abdomen. This will expand your abdomen to give the surgeon more room to perform the surgery. Other tools that are needed for the procedure will be inserted through the other incisions. The gallbladder will be removed through one of the incisions. After your gallbladder has been removed, the incisions will be closed with stitches (sutures), staples, or skin glue. Your incisions may be covered with a bandage (dressing). The procedure may vary among health care providers and hospitals. AFTER THE PROCEDURE Your blood pressure, heart rate, breathing rate, and blood oxygen level will be monitored often until the medicines you were given have worn off. You will be given medicines as needed to control your pain.   This information is not intended to replace advice given to you by your health care provider. Make sure you discuss any questions you have with your health care provider.   Document Released: 04/08/2005 Document Revised: 12/28/2014 Document Reviewed: 11/18/2012 Elsevier Interactive Patient Education 2016 [place] Diet for Gallbladder Conditions A low-fat diet can be helpful if you have pancreatitis or a gallbladder condition. With these conditions, your pancreas and gallbladder have trouble digesting fats. A healthy eating plan with less fat will help rest your pancreas and gallbladder and reduce your symptoms. WHAT DO I NEED TO KNOW ABOUT THIS DIET? Eat a low-fat diet. Reduce your fat intake to less than 20-30% of your total daily calories. This is less than 50-60 g of fat per day. Remember that you need some fat in your diet. Ask  your dietician what your daily goal should be. Choose nonfat and low-fat healthy foods. Look for the words "nonfat," "low fat," or "fat free." As a guide, look on the label and choose foods with less than 3 g of fat per serving. Eat only one serving. Avoid alcohol. Do not smoke. If you need help quitting, talk with your health care provider. Eat small frequent meals instead of three large heavy meals. WHAT FOODS CAN I EAT? Grains Include healthy grains and starches such as potatoes, wheat bread, fiber-rich cereal, and brown rice. Choose whole grain options whenever possible. In adults, whole grains should account for 45-65% of your daily calories.  Fruits and Vegetables Eat plenty of fruits and vegetables. Fresh fruits and vegetables add fiber to your diet. Meats and Other Protein Sources Eat lean meat such as chicken and pork. Trim any fat off of meat before cooking it. Eggs, fish, and beans are other sources of protein. In adults, these foods should account for 10-35% of your daily calories. Dairy Choose low-fat milk and dairy options. Dairy includes fat and protein, as well as calcium.  Fats and Oils Limit high-fat foods such as fried foods, sweets, baked goods, sugary drinks.  Other Creamy sauces and condiments, such as mayonnaise, can add extra fat. Think about whether or not you need to  use them, or use smaller amounts or low fat options. WHAT FOODS ARE NOT RECOMMENDED? High fat foods, such as: [employer]. Ice cream. [place] toast. Sweet rolls. Pizza. Cheese bread. Foods covered with batter, butter, creamy sauces, or cheese. Fried foods. Sugary drinks and desserts. Foods that cause gas or bloating    Advised to pursue a goal of 25 to 30 g of fiber daily.  Made aware that the majority of this may be through natural sources, but advised to be aware of actual consumption and to ensure minimal consumption by daily supplementation.  Various forms of supplements discussed.   Recommended Psyllium husk, that mixes well with applesauce, or the powder which goes down well shaken with chocolate milk. Or may try Benefiber, or Metamucil.  Strongly advised to consume more fluids to ensure adequate hydration, instructed to watch color of urine to determine adequacy of hydration.  Clarity is pursued in urine output, and bowel activity that correlates to significant meal intake.   We need to avoid deferring having bowel movements, advised to take the time at the first sign of sensation, typically following meals, and in the morning.   Never skip a day...  To be regular, we must do the above EVERY day.

## 2022-02-28 ENCOUNTER — Encounter: Payer: Self-pay | Admitting: Hematology and Oncology

## 2022-02-28 ENCOUNTER — Inpatient Hospital Stay: Payer: Medicare Other | Attending: Hematology and Oncology | Admitting: Hematology and Oncology

## 2022-02-28 ENCOUNTER — Telehealth: Payer: Self-pay | Admitting: Surgery

## 2022-02-28 VITALS — BP 133/53 | HR 59 | Temp 97.7°F | Resp 18 | Ht 65.0 in | Wt 156.8 lb

## 2022-02-28 DIAGNOSIS — C786 Secondary malignant neoplasm of retroperitoneum and peritoneum: Secondary | ICD-10-CM | POA: Insufficient documentation

## 2022-02-28 DIAGNOSIS — R21 Rash and other nonspecific skin eruption: Secondary | ICD-10-CM | POA: Diagnosis not present

## 2022-02-28 DIAGNOSIS — J984 Other disorders of lung: Secondary | ICD-10-CM

## 2022-02-28 DIAGNOSIS — L299 Pruritus, unspecified: Secondary | ICD-10-CM

## 2022-02-28 DIAGNOSIS — C5701 Malignant neoplasm of right fallopian tube: Secondary | ICD-10-CM

## 2022-02-28 NOTE — Assessment & Plan Note (Signed)
Appointment to see pulmonologist was rescheduled to next month I recommend the patient to keep her appointment

## 2022-02-28 NOTE — Telephone Encounter (Signed)
Patient has been advised of Pre-Admission date/time, and Surgery date at [facility].  Surgery Date: 03/12/22 Preadmission Testing Date: 03/06/22 (phone 1p-5p)  Patient has been made aware to call [phone], between 1-3:00pm the day before surgery, to find out what time to arrive for surgery.

## 2022-02-28 NOTE — Progress Notes (Signed)
[place] OFFICE PROGRESS NOTE  Patient Care Team: Gaynelle Arabian, MD as PCP - General (Family Medicine) Heath Lark, MD as Consulting Physician (Hematology and Oncology) Everitt Amber, MD as Consulting Physician (Obstetrics and Gynecology)  ASSESSMENT & PLAN:  Adenocarcinoma of right fallopian tube [facility]) She is not symptomatic from GYN cancer perspective Her appointment to see pulmonologist this next month I will see her again next month for further follow-up on the skin rash and pathology from her gallbladder specimen  Itch of skin It is possible this is related to her gallbladder disease I recommend her to stop prednisone altogether  Cavitary lesion of lung Appointment to see pulmonologist was rescheduled to next month I recommend the patient to keep her appointment  No orders of the defined types were placed in this encounter.   All questions were answered. The patient knows to call the clinic with any problems, questions or concerns. The total time spent in the appointment was 25 minutes encounter with patients including review of chart and various tests results, discussions about plan of care and coordination of care plan   Heath Lark, MD 02/28/2022 9:41 AM  INTERVAL HISTORY: Please see below for problem oriented charting. she returns for further follow-up with her husband Her skin itching has resolved but she has persistent rash that does not bother her  She was seen by general surgeon with plan for gallbladder surgery in about 2 weeks She denies recent cough, chest pain or shortness of breath  REVIEW OF SYSTEMS:   Constitutional: Denies fevers, chills or abnormal weight loss Eyes: Denies blurriness of vision Ears, nose, mouth, throat, and face: Denies mucositis or sore throat Respiratory: Denies cough, dyspnea or wheezes Cardiovascular: Denies palpitation, chest discomfort or lower extremity swelling Gastrointestinal:  Denies nausea, heartburn or  change in bowel habits Skin: Denies abnormal skin rashes Lymphatics: Denies new lymphadenopathy or easy bruising Neurological:Denies numbness, tingling or new weaknesses Behavioral/Psych: Mood is stable, no new changes  All other systems were reviewed with the patient and are negative.  I have reviewed the past medical history, past surgical history, social history and family history with the patient and they are unchanged from previous note.  ALLERGIES:  is allergic to dilaudid [hydromorphone hcl], morphine and related, and sudafed [pseudoephedrine hcl].  MEDICATIONS:  Current Outpatient Medications  Medication Sig Dispense Refill   acetaminophen (TYLENOL) 325 MG tablet Take 650 mg by mouth every 6 (six) hours as needed for headache (pain).     atenolol (TENORMIN) 25 MG tablet TAKE 1 TABLET BY MOUTH EVERY DAY 30 tablet 2   cloNIDine (CATAPRES) 0.1 MG tablet TAKE 1 TABLET BY MOUTH EVERY DAY 30 tablet 1   doxazosin (CARDURA) 4 MG tablet TAKE 1 TABLET BY MOUTH EVERY DAY 30 tablet 1   hydrochlorothiazide (HYDRODIURIL) 25 MG tablet TAKE 1 TABLET (25 MG TOTAL) BY MOUTH DAILY. 30 tablet 3   lidocaine-prilocaine (EMLA) cream Apply to affected area once daily as directed. (Patient taking differently: 1 application  as needed (access port). Apply to affected area once daily as directed.) 30 g 3   lisinopril (ZESTRIL) 20 MG tablet TAKE 1 TABLET BY MOUTH EVERY DAY IN THE EVENING 60 tablet 0   venlafaxine XR (EFFEXOR-XR) 37.5 MG 24 hr capsule TAKE 1 CAPSULE BY MOUTH DAILY WITH BREAKFAST 90 capsule 3   No current facility-administered medications for this visit.   Facility-Administered Medications Ordered in Other Visits  Medication Dose Route Frequency Provider Last Rate Last Admin  sodium chloride flush (NS) 0.9 % injection 10 mL  10 mL Intravenous PRN Alvy Bimler, Trice Aspinall, MD   10 mL at 02/11/17 0839   sodium chloride flush (NS) 0.9 % injection 10 mL  10 mL Intravenous PRN Alvy Bimler, Shantina Chronister, MD   10 mL at  03/04/17 1510    SUMMARY OF ONCOLOGIC HISTORY: Oncology History Overview Note  Neg genetics   Adenocarcinoma of right fallopian tube ([place])  08/12/2016 Initial Diagnosis   The patient has many months of vague symptoms of fullness in the pelvis, urinary frequency and difficulty with defecation. She denies vaginal bleeding or rectal bleeding.     08/12/2016 Imaging   Abdomen X-ray in the ER: Moderate colonic stool burden without evidence of enteric obstruction   11/13/2016 Imaging   TVUS was performed on 11/13/16 which showed a large hypoechoic lobular mass seen posterior to the uterus measuring 18.2x16.1x11.8cm, blood flow was seen along the periphery of the mass. It was considered to be likely to be a fibroid vs pelvic mass vs ovarian mass. Neither ovary was removed. Moderate amount of free fluid in the pelvis. THe uterus measures 4x10x4cm. The endometrium is 89m.    12/05/2016 Imaging   MR pelvis 1. Mild limitations as detailed above. 2. Heterogeneous pelvic mass is favored to arise from the right ovary. Favor a solid ovarian neoplasm such as fibroma/Brenner's tumor. Given lesion size and heterogeneous T2 signal out, complicating torsion cannot be excluded. 3. Moderate abdominopelvic ascites, without specific evidence of peritoneal metastasis. Consider further evaluation with contrast-enhanced abdominopelvic CT.    12/26/2016 Pathology Results   1. Uterus, ovaries and fallopian tubes - ADENOCARCINOMA INVOLVING RIGHT FALLOPIAN TUBE, RIGHT AND LEFT OVARIES, UTERINE SEROSA AND PELVIC MASS. - SEE ONCOLOGY TABLE AND COMMENT. - UTERINE CERVIX, ENDOMETRIUM, MYOMETRIUM AND LEFT FALLOPIAN TUBE FREE OF TUMOR. 2. Omentum, resection for tumor - ADENOCARCINOMA. Microscopic Comment 1. ONCOLOGY TABLE - FALLOPIAN TUBE. SEE COMMENT 1. Specimen, including laterality: Uterus, bilateral adnexa, pelvic mass and omentum. 2. Procedure: Hysterectomy with bilateral salpingo-oophorectomy, pelvic mass excision  and omentectomy. 3. Lymph node sampling performed: No 4. Tumor site: See comment. 5. Tumor location in fallopian tube: Right fallopian tube fimbria 6. Specimen integrity (intact/ruptured/disrupted): Intact 7. Tumor size (cm): 18 cm, see comment. 8. Histologic type: Adenocarcinoma 9. Grade: High grade 10. Microscopic tumor extension: Tumor involves right fallopian tube, right and left ovaries, uterine serosa, pelvic mass and omentum. 11. Margins: See comment. 12. Lymph-Vascular invasion: Present 13. Lymph nodes: # examined: 0; # positive: N/A 14. TNM: pT3c, pNX 15. FIGO Stage (based on pathologic findings, needs clinical correlation: III-C 16. Comments: There is an 18 cm pelvic mass which is a high grade adenocarcinoma and there is a 12.2 cm  segment of omentum which is extensively involved with adenocarcinoma. The tumor also involves the fimbria of the right fallopian tube and the parenchyma of the right and left ovaries as well as uterine serosa. The fimbria of the right fallopian has intraepithelial atypia consistent with a precursor lesion and therefore this is most consistent with primary fallopian tube adenocarcinoma. A primary peritoneal serous carcinoma is a less likely possibility.    12/26/2016 Pathology Results   PERITONEAL/ASCITIC FLUID(SPECIMEN 1 OF 1 COLLECTED 12/26/16): POORLY DIFFERENTIATED ADENOCARCINOMA   12/26/2016 Surgery   Procedure(s) Performed: Exploratory laparotomy with total abdominal hysterectomy, bilateral salpingo-oophorectomy, omentectomy radical tumor debulking for ovarian cancer .   Surgeon: EThereasa Solo MD.      Operative Findings: 20cm left ovarian mass densely adherent to the posterior  uterus, right tube and ovary, cervix, sigmoid colon. 10cm omental cake. 4L ascites. 62m size tumor nodules on the serosa of the terminal ileum and proximal sigmoid colon. 145mnodules on right diaphragm.    This represented an optimal cytoreduction (R1) with 82m47modules on  intestine and diaphragm representing gross visible disease   01/16/2017 Procedure   Placement of single lumen port a cath via right internal jugular vein. The catheter tip lies at the cavo-atrial junction. A power injectable port a cath was placed and is ready for immediate use.   01/17/2017 Imaging   1. 3.5 x 7.2 x 4.6 cm fluid collection along the vaginal cuff, posterior the bladder and extending into the right adnexal space. This lesion demonstrates rim enhancement. Imaging features could be related to a loculated postoperative seroma or hematoma. Superinfection cannot be excluded by CT. Given debulking surgery was 3 weeks ago, this entire structure is un likely to represent neoplasm, but peritoneal involvement could have this appearance. 2. Small fluid collection in the left para colic gutter without rim enhancement. 3. Irregular/nodular appearance of the peritoneal M in the anatomic pelvis with areas of subtle nodularity between the stomach and the spleen. Close attention in these regions on follow-up recommended as metastatic disease is a concern. 4. Mildly enlarged hepatoduodenal ligament lymph node. Attention on follow-up recommended.   01/20/2017 Tumor Marker   Patient's tumor was tested for the following markers: CA-125 Results of the tumor marker test revealed 46.6   01/28/2017 Genetic Testing       Negative genetic testing on the [facility].  The [facility] panel offered by [employer] sequencing and deletion/duplication testing of the following 28 genes: APC, ATM, BARD1, BMPR1A, BRCA1, BRCA2, BRIP1, CHD1, CDK4, CDKN2A, CHEK2, EPCAM (large rearrangement only), MLH1, MSH2, MSH6, MUTYH, NBN, PALB2, PMS2, PTEN, RAD51C, RAD51D, SMAD4, STK11, and TP53. Sequencing was performed for select regions of POLE and POLD1, and large rearrangement analysis was performed for select regions of GREM1. The report date is January 28, 2017.  HRD testing looking for genomic  instability and BRCA mutations was negative.  The report date of this test is January 27, 2017.    01/31/2017 Tumor Marker   Patient's tumor was tested for the following markers: CA-125 Results of the tumor marker test revealed 22.4   03/04/2017 Tumor Marker   Patient's tumor was tested for the following markers: CA-125 Results of the tumor marker test revealed 13.8   04/18/2017 Tumor Marker   Patient's tumor was tested for the following markers: CA-125 Results of the tumor marker test revealed 11.9   05/05/2017 Tumor Marker   Patient's tumor was tested for the following markers: CA-125 Results of the tumor marker test revealed 12   05/26/2017 Tumor Marker   Patient's tumor was tested for the following markers: CA-125 Results of the tumor marker test revealed 10.6   05/26/2017 Imaging   1. A previously noted postoperative fluid collection in the low pelvis and residual ascites seen on the prior examination has resolved on today's study. No findings to suggest residual/recurrent disease on today's examination. No definite solid organ metastasis identified in the abdomen or pelvis. No lymphadenopathy. 2. Aortic atherosclerosis.   06/10/2017 Procedure   Successful right IJ vein Port-A-Cath explant.   03/09/2018 Tumor Marker   Patient's tumor was tested for the following markers: CA-125 Results of the tumor marker test revealed 12.1   05/26/2018 Tumor Marker   Patient's tumor was tested for the following markers:  CA-125 Results of the tumor marker test revealed 13.8   09/09/2018 Tumor Marker   Patient's tumor was tested for the following markers: CA-125 Results of the tumor marker test revealed 23.9   12/18/2018 Tumor Marker   Patient's tumor was tested for the following markers: CA-125 Results of the tumor marker test revealed 43.8   02/27/2019 - 02/28/2019 Hospital Admission   She was admitted for bowel obstruction   02/27/2019 Imaging   1. High-grade small bowel obstruction with  transition point in the pelvis in an area of suspected desmoplastic reaction surrounding a 1.3 cm spiculated nodule in the mesentery, suspicious for carcinoid. There is hyperenhancement near the tip of the appendix and loss of the normal fat plane between it and the adjacent sigmoid colon, potentially the site of primary lesion. 2. Multiple new small subcentimeter hyperdense lesions scattered along the liver capsule, concerning for metastases. Enlarged hyperenhancing gastrohepatic and portacaval lymph nodes concerning for nodal metastases. 3. Very mild right hydroureteronephrosis to the level of the desmoplastic reaction in the pelvis, potentially involving the right ureter. 4. Infraumbilical ventral abdominal wall diastasis containing a small nondilated portion of transverse colon. 5. Trace ascites. 6. Cholelithiasis. 7.  Aortic atherosclerosis (ICD10-I70.0).     03/05/2019 Tumor Marker   Patient's tumor was tested for the following markers: CA-125 Results of the tumor marker test revealed 70.1   03/12/2019 Echocardiogram   IMPRESSIONS     1. Left ventricular ejection fraction, by visual estimation, is 60 to 65%. The left ventricle has normal function. There is no left ventricular hypertrophy.  2. Left ventricular diastolic parameters are consistent with Grade I diastolic dysfunction (impaired relaxation).  3. Global right ventricle has normal systolic function.The right ventricular size is normal. No increase in right ventricular wall thickness.  4. Left atrial size was normal.  5. Right atrial size was normal.  6. The pericardium was not assessed.  7. The mitral valve is normal in structure. No evidence of mitral valve regurgitation.  8. The tricuspid valve is normal in structure. Tricuspid valve regurgitation is trivial.  9. The aortic valve is normal in structure. Aortic valve regurgitation is not visualized. 10. The pulmonic valve was grossly normal. Pulmonic valve regurgitation is not  visualized. 11. The average left ventricular global longitudinal strain is -17.6 %.   03/16/2019 Procedure   Successful placement of a right internal jugular approach power injectable Port-A-Cath. The catheter is ready for immediate use.     04/19/2019 Tumor Marker   Patient's tumor was tested for the following markers: CA-125 Results of the tumor marker test revealed 39.8   05/17/2019 Tumor Marker   Patient's tumor was tested for the following markers: CA-125 Results of the tumor marker test revealed 27   05/28/2019 Tumor Marker   Patient's tumor was tested for the following markers: CA-125 Results of the tumor marker test revealed 27.7.   06/11/2019 Imaging   1. No new or progressive metastatic disease in the abdomen or pelvis. 2. Tiny noncalcified perisplenic implant is decreased. Calcified pericardiophrenic, retroperitoneal and bilateral inguinal lymph nodes and scattered small calcified perihepatic and perisplenic implants are stable. 3.  Aortic Atherosclerosis (ICD10-I70.0).       06/17/2019 Echocardiogram    1. Left ventricular ejection fraction, by estimation, is 65 to 70%. The left ventricle has normal function. The left ventricle has no regional wall motion abnormalities. Left ventricular diastolic parameters were normal.  2. Right ventricular systolic function is normal. The right ventricular size is normal.  3.  The mitral valve is normal in structure and function. No evidence of mitral valve regurgitation. No evidence of mitral stenosis.  4. The aortic valve is normal in structure and function. Aortic valve regurgitation is not visualized. No aortic stenosis is present.   06/21/2019 Tumor Marker   Patient's tumor was tested for the following markers: CA-125 Results of the tumor marker test revealed 18.7   07/19/2019 Tumor Marker   Patient's tumor was tested for the following markers: CA-125 Results of the tumor marker test revealed 16.7   08/30/2019 Tumor Marker    Patient's tumor was tested for the following markers: CA-125. Results of the tumor marker test revealed 13.9   09/15/2019 Echocardiogram    1. Left ventricular ejection fraction, by estimation, is 65 to 70%. The left ventricle has normal function. The left ventricle has no regional wall motion abnormalities. Left ventricular diastolic parameters are consistent with Grade I diastolic dysfunction (impaired relaxation). The average left ventricular global longitudinal strain is 18.1 %. The global longitudinal strain is normal.  2. Right ventricular systolic function is normal. The right ventricular size is normal.  3. The mitral valve is normal in structure. No evidence of mitral valve regurgitation. No evidence of mitral stenosis.  4. The aortic valve is normal in structure. Aortic valve regurgitation is not visualized. No aortic stenosis is present.  5. The inferior vena cava is normal in size with greater than 50% respiratory variability, suggesting right atrial pressure of 3 mmHg.     09/24/2019 Imaging   1. Stable exam. No new or progressive metastatic disease on today's study. 2. Small subcapsular hypodensity in the lateral spleen, new in the interval, but indeterminate. Attention on follow-up recommended. 3. The calcified upper abdominal and groin lymph nodes are stable as are the scattered calcified and noncalcified peritoneal implants. 4. Stable midline ventral hernia containing a short segment of colon without complicating features. 5. Aortic Atherosclerosis (ICD10-I70.0).     10/04/2019 - 09/21/2021 Chemotherapy   Patient is on Treatment Plan : ovarian Bevacizumab q21d     10/04/2019 Tumor Marker   Patient's tumor was tested for the following markers: CA-125. Results of the tumor marker test revealed 12.9.   11/01/2019 Tumor Marker   Patient's tumor was tested for the following markers: CA-125 Results of the tumor marker test revealed 15.4   12/13/2019 Tumor Marker   Patient's tumor was  tested for the following markers: CA-125 Results of the tumor marker test revealed 14.8   12/31/2019 Imaging   1. Unchanged tiny peritoneal nodule adjacent to the spleen measuring 6 mm. Other previously noted peritoneal nodules are imperceptible on present examination. 2. Unchanged prominent, partially calcified portacaval lymph node and bilateral inguinal lymph nodes. 3. Unchanged subtle, nonspecific subcapsular lesion of the spleen.  4. No evidence of new metastatic disease in the abdomen or pelvis. 5. Status post hysterectomy. 6. Unchanged low midline ventral abdominal hernia containing a single loop of nonobstructed transverse colon. 7. Cholelithiasis. 8. Aortic Atherosclerosis (ICD10-I70.0).       01/03/2020 Tumor Marker   Patient's tumor was tested for the following markers: CA-125 Results of the tumor marker test revealed 11.7.   01/24/2020 Tumor Marker   Patient's tumor was tested for the following markers: CA-125. Results of the tumor marker test revealed 15.1   03/06/2020 Tumor Marker   Patient's tumor was tested for the following markers: CA-125. Results of the tumor marker test revealed 20.3   03/27/2020 Tumor Marker   Patient's tumor was tested for  the following markers: CA-125 Results of the tumor marker test revealed 23.7   05/11/2020 Tumor Marker   Patient's tumor was tested for the following markers: CA-125. Results of the tumor marker test revealed 25.4   06/23/2020 Imaging   1. Cholelithiasis with gallbladder wall thickening and mild infiltrative edema in the porta hepatis, raising the possibility of acute cholecystitis. There is truncation of the common bile duct distally and distal choledocholithiasis is difficult to exclude. Correlate with bilirubin levels. If clinically warranted, MRCP could be utilized for further characterization. 2. Stable tiny perisplenic nodule. 3. Other imaging findings of potential clinical significance: Small type 1 hiatal hernia.  Prominent stool throughout the colon favors constipation. Ventral infraumbilical hernia contains transverse colon without findings of strangulation or obstruction. Small supraumbilical hernia contains adipose tissue. Lumbar degenerative disc disease at L5-S1. Stable tiny nodule along the lateral margin of the spleen, nonspecific. 4. Aortic atherosclerosis.     12/15/2020 Imaging   No evidence of recurrent or metastatic carcinoma within the abdomen or pelvis.   Stable ventral hernia containing transverse colon. No evidence of bowel obstruction or strangulation.   Cholelithiasis. No radiographic evidence of cholecystitis.   Tiny hiatal hernia.   Aortic Atherosclerosis (ICD10-I70.0).     06/11/2021 Imaging   1. Numerous sub 4 mm pulmonary nodules in the lungs some of which have a cavitary appearance. I think it is unlikely this is metastatic disease and more likely a possible immunotherapy related process such as a sarcoid like reaction. Recommend full chest CT further evaluation. 2. Stable small calcified retroperitoneal and mesenteric lymph nodes. No findings suspicious for recurrent peritoneal carcinomatosis. 3. Stable lower abdominal wall hernia containing part of the transverse colon. 4. Cholelithiasis.   09/27/2021 Imaging   1. Numerous small pulmonary nodules throughout the lungs, the majority of which are cavitary. These are all subcentimeter, however nodules that were seen in the bilateral lung bases on prior CT dated 06/08/2021 appear slightly increased in size and number. Behavior is highly suspicious for pulmonary metastatic disease despite the unusual appearance, however as previously reported general differential considerations include atypical infection and inflammation, such as drug-induced sarcoidosis like reaction.  2. Unchanged calcified lymph nodes in the abdomen and pelvis, consistent with treated nodal metastases. 3. Cholelithiasis with wall thickening and pericholecystic  fluid. This appearance is similar to prior examination and suggests chronic cholecystitis. Correlate for clinical symptoms. Mild intrahepatic biliary ductal dilatation, similar to prior examination. 4. Midline ventral hernia containing a single nonobstructed loop of mid transverse colon.     12/25/2021 Imaging   1. Again noted are multiple thin walled cavitary lung nodules throughout both lungs. These are similar in size and multiplicity when compared with the exam from 09/26/2021. Etiology remains indeterminate, although metastatic disease cannot be excluded with a high degree of certainty. 2. Stable calcified upper abdominal lymph nodes consistent with treated nodal metastases. 3. Gallstones. 4. Aortic Atherosclerosis (ICD10-I70.0).   01/30/2022 Tumor Marker   Patient's tumor was tested for the following markers: CA-125. Results of the tumor marker test revealed 59.3.   01/31/2022 Imaging   1. No substantial interval change in exam. 2. Innumerable tiny cavitary pulmonary nodules in the lung bases. As noted previously, metastatic disease would be distinct consideration although imaging features are nonspecific and infectious/inflammatory etiology is not excluded. 3. Calcified nodal disease in the upper abdomen and pelvis shows no substantial interval change. Scattered noncalcified nodules in the mesentery are similar to prior. 4. Markedly large stool volume throughout the colon. Imaging  features suggestive of clinical constipation. 5. Cholelithiasis with ill-defined appearance of the gallbladder wall. Appearance is stable in the interval. 6. Trace free fluid in the cul-de-sac. 7. Midline ventral hernia contains a short segment of transverse colon without complicating features. 8. Aortic Atherosclerosis (ICD10-I70.0).     PHYSICAL EXAMINATION: ECOG PERFORMANCE STATUS: 0 - Asymptomatic  Vitals:   02/28/22 0810  BP: (!) 133/53  Pulse: (!) 59  Resp: 18  Temp: 97.7 F (36.5 C)  SpO2:  100%   Filed Weights   02/28/22 0810  Weight: 156 lb 12.8 oz (71.1 kg)    GENERAL:alert, no distress and comfortable SKIN: She has persistent skin rash, improved compared to prior visit  NEURO: alert & oriented x 3 with fluent speech, no focal motor/sensory deficits  LABORATORY DATA:  I have reviewed the data as listed    Component Value Date/Time   NA 137 01/29/2022 1411   NA 139 04/14/2017 0742   K 4.4 01/29/2022 1411   K 4.0 04/14/2017 0742   CL 107 01/29/2022 1411   CO2 27 01/29/2022 1411   CO2 21 (L) 04/14/2017 0742   GLUCOSE 110 (H) 01/29/2022 1411   GLUCOSE 247 (H) 04/14/2017 0742   BUN 28 (H) 01/29/2022 1411   BUN 13.1 04/14/2017 0742   CREATININE 1.36 (H) 01/29/2022 1411   CREATININE 0.99 03/12/2021 0832   CREATININE 0.9 04/14/2017 0742   CALCIUM 8.7 (L) 01/29/2022 1411   CALCIUM 9.2 04/14/2017 0742   PROT 6.0 (L) 01/29/2022 1411   PROT 7.3 04/14/2017 0742   ALBUMIN 3.5 01/29/2022 1411   ALBUMIN 3.9 04/14/2017 0742   AST 13 (L) 01/29/2022 1411   AST 18 03/12/2021 0832   AST 17 04/14/2017 0742   ALT 6 01/29/2022 1411   ALT 9 03/12/2021 0832   ALT 14 04/14/2017 0742   ALKPHOS 50 01/29/2022 1411   ALKPHOS 70 04/14/2017 0742   BILITOT 0.4 01/29/2022 1411   BILITOT 0.8 03/12/2021 0832   BILITOT 0.37 04/14/2017 0742   GFRNONAA 43 (L) 01/29/2022 1411   GFRNONAA >60 03/12/2021 0832   GFRAA >60 01/24/2020 0924   GFRAA >60 07/19/2019 0951    No results found for: "SPEP", "UPEP"  Lab Results  Component Value Date   WBC 5.9 01/29/2022   NEUTROABS 3.8 01/29/2022   HGB 11.3 (L) 01/29/2022   HCT 34.0 (L) 01/29/2022   MCV 93.2 01/29/2022   PLT 160 01/29/2022      Chemistry      Component Value Date/Time   NA 137 01/29/2022 1411   NA 139 04/14/2017 0742   K 4.4 01/29/2022 1411   K 4.0 04/14/2017 0742   CL 107 01/29/2022 1411   CO2 27 01/29/2022 1411   CO2 21 (L) 04/14/2017 0742   BUN 28 (H) 01/29/2022 1411   BUN 13.1 04/14/2017 0742   CREATININE  1.36 (H) 01/29/2022 1411   CREATININE 0.99 03/12/2021 0832   CREATININE 0.9 04/14/2017 0742      Component Value Date/Time   CALCIUM 8.7 (L) 01/29/2022 1411   CALCIUM 9.2 04/14/2017 0742   ALKPHOS 50 01/29/2022 1411   ALKPHOS 70 04/14/2017 0742   AST 13 (L) 01/29/2022 1411   AST 18 03/12/2021 0832   AST 17 04/14/2017 0742   ALT 6 01/29/2022 1411   ALT 9 03/12/2021 0832   ALT 14 04/14/2017 0742   BILITOT 0.4 01/29/2022 1411   BILITOT 0.8 03/12/2021 0832   BILITOT 0.37 04/14/2017 0742

## 2022-02-28 NOTE — Telephone Encounter (Signed)
Updated information regarding rescheduled surgery.  Please inform patient of the following scheduled surgery for 03/07/22.    Patient has been advised of Pre-Admission date/time, and Surgery date at [facility].  Surgery Date: 03/07/22 Preadmission Testing Date: 03/06/22 (phone 1p-5p)  Also patient will need to call at [phone], between 1-3:00pm the day before surgery, to find out what time to arrive for surgery.

## 2022-02-28 NOTE — Assessment & Plan Note (Signed)
She is not symptomatic from GYN cancer perspective Her appointment to see pulmonologist this next month I will see her again next month for further follow-up on the skin rash and pathology from her gallbladder specimen

## 2022-02-28 NOTE — Assessment & Plan Note (Signed)
It is possible this is related to her gallbladder disease I recommend her to stop prednisone altogether

## 2022-03-01 ENCOUNTER — Telehealth: Payer: Self-pay | Admitting: Hematology and Oncology

## 2022-03-01 NOTE — Telephone Encounter (Signed)
Scheduled appointment per 11/9 los. Patient is aware.

## 2022-03-01 NOTE — Telephone Encounter (Signed)
Patient calls back, she is now informed of all dates regarding her surgery.

## 2022-03-04 ENCOUNTER — Encounter: Payer: Self-pay | Admitting: Hematology and Oncology

## 2022-03-06 ENCOUNTER — Encounter
Admission: RE | Admit: 2022-03-06 | Discharge: 2022-03-06 | Disposition: A | Discharge status: Home / Self Care | Payer: Medicare Other | Source: Ambulatory Visit | Attending: Surgery | Admitting: Surgery

## 2022-03-06 ENCOUNTER — Encounter: Payer: Self-pay | Admitting: Urgent Care

## 2022-03-06 VITALS — Ht 65.0 in | Wt 151.0 lb

## 2022-03-06 DIAGNOSIS — I10 Essential (primary) hypertension: Secondary | ICD-10-CM | POA: Insufficient documentation

## 2022-03-06 DIAGNOSIS — Z01818 Encounter for other preprocedural examination: Secondary | ICD-10-CM | POA: Diagnosis present

## 2022-03-06 DIAGNOSIS — N183 Chronic kidney disease, stage 3 unspecified: Secondary | ICD-10-CM | POA: Diagnosis not present

## 2022-03-06 LAB — BASIC METABOLIC PANEL
Anion gap: 10 (ref 5–15)
BUN: 24 mg/dL — ABNORMAL HIGH (ref 8–23)
CO2: 23 mmol/L (ref 22–32)
Calcium: 9 mg/dL (ref 8.9–10.3)
Chloride: 105 mmol/L (ref 98–111)
Creatinine, Ser: 1.35 mg/dL — ABNORMAL HIGH (ref 0.44–1.00)
GFR, Estimated: 43 mL/min — ABNORMAL LOW (ref >60)
Glucose, Bld: 121 mg/dL — ABNORMAL HIGH (ref 70–99)
Potassium: 4.3 mmol/L (ref 3.5–5.1)
Sodium: 138 mmol/L (ref 135–145)

## 2022-03-06 NOTE — Patient Instructions (Signed)
Your procedure is scheduled on: Thursday March 07, 2022. Report to Day Surgery inside [place] 2nd floor, stop by registration desk before getting on elevator.  To find out your arrival time please call [phone] between 1PM - 3PM on  Wednesday March 06, 2022.  Remember: Instructions that are not followed completely may result in serious medical risk,  up to and including death, or upon the discretion of your surgeon and anesthesiologist your  surgery may need to be rescheduled.     _X__ 1. Do not eat food after midnight the night before your procedure.                 No chewing gum or hard candies. You may drink clear liquids up to 2 hours                 before you are scheduled to arrive for your surgery- DO not drink clear                 liquids within 2 hours of the start of your surgery.                 Clear Liquids include:  water, apple juice without pulp, clear Gatorade, G2 or                  Gatorade Zero (avoid Red/Purple/Blue), Black Coffee or Tea (Do not add                 anything to coffee or tea).  __X__2.  On the morning of surgery brush your teeth with toothpaste and water, you                may rinse your mouth with mouthwash if you wish.  Do not swallow any toothpaste or mouthwash.     _X__ 3.  No Alcohol for 24 hours before or after surgery.   _X__ 4.  Do Not Smoke or use e-cigarettes For 24 Hours Prior to Your Surgery.                 Do not use any chewable tobacco products for at least 6 hours prior to                 Surgery.  _X__  5.  Do not use any recreational drugs (marijuana, cocaine, heroin, ecstasy, MDMA or other)                For at least one week prior to your surgery.  Combination of these drugs with anesthesia                May have life threatening results.  ____  6.  Bring all medications with you on the day of surgery if instructed.   __X__  7.  Notify your doctor if there is any change in your  medical condition      (cold, fever, infections).     Do not wear jewelry, make-up, hairpins, clips or nail polish. Do not wear lotions, powders, or perfumes. You may wear deodorant. Do not shave 48 hours prior to surgery. Men may shave face and neck. Do not bring valuables to the hospital.    [facility] is not responsible for any belongings or valuables.  Contacts, dentures or bridgework may not be worn into surgery. Leave your suitcase in the car. After surgery it may be brought to your room. For patients admitted to the hospital, discharge time  is determined by your treatment team.   Patients discharged the day of surgery will not be allowed to drive home.   Make arrangements for someone to be with you for the first 24 hours of your Same Day Discharge.   __X__ Take these medicines the morning of surgery with A SIP OF WATER:    1. atenolol (TENORMIN) 25 MG   2. doxazosin (CARDURA) 4 MG   3. venlafaxine XR (EFFEXOR-XR) 37.5 MG   4.  5.  6.  ____ Fleet Enema (as directed)   __X__ Use CHG Soap (or wipes) as directed  ____ Use Benzoyl Peroxide Gel as instructed  ____ Use inhalers on the day of surgery  ____ Stop metformin 2 days prior to surgery    ____ Take 1/2 of usual insulin dose the night before surgery. No insulin the morning          of surgery.   ____ Call your PCP, cardiologist, or Pulmonologist if taking Coumadin/Plavix/aspirin and ask when to stop before your surgery.   __X__ One Week prior to surgery- Stop Anti-inflammatories such as Ibuprofen, Aleve, Advil, Motrin, meloxicam (MOBIC), diclofenac, etodolac, ketorolac, Toradol, Daypro, piroxicam, Goody's or BC powders. OK TO USE TYLENOL IF NEEDED   __X__ Do not start any new vitamins and or supplements until after surgery.    ____ Bring C-Pap to the hospital.    If you have any questions regarding your pre-procedure instructions,  Please call Pre-admit Testing at [phone]

## 2022-03-07 ENCOUNTER — Ambulatory Visit: Payer: Medicare Other | Admitting: Urgent Care

## 2022-03-07 ENCOUNTER — Ambulatory Visit
Admission: RE | Admit: 2022-03-07 | Discharge: 2022-03-07 | Disposition: A | Discharge status: Home / Self Care | Payer: Medicare Other | Source: Ambulatory Visit | Attending: Surgery | Admitting: Surgery

## 2022-03-07 ENCOUNTER — Encounter: Payer: Self-pay | Admitting: Surgery

## 2022-03-07 ENCOUNTER — Encounter
Admission: RE | Disposition: A | Discharge status: Home / Self Care | Payer: Self-pay | Source: Ambulatory Visit | Attending: Surgery

## 2022-03-07 ENCOUNTER — Other Ambulatory Visit: Payer: Self-pay

## 2022-03-07 DIAGNOSIS — C792 Secondary malignant neoplasm of skin: Secondary | ICD-10-CM | POA: Insufficient documentation

## 2022-03-07 DIAGNOSIS — Z8544 Personal history of malignant neoplasm of other female genital organs: Secondary | ICD-10-CM | POA: Insufficient documentation

## 2022-03-07 DIAGNOSIS — K219 Gastro-esophageal reflux disease without esophagitis: Secondary | ICD-10-CM | POA: Diagnosis not present

## 2022-03-07 DIAGNOSIS — K8020 Calculus of gallbladder without cholecystitis without obstruction: Secondary | ICD-10-CM

## 2022-03-07 DIAGNOSIS — I10 Essential (primary) hypertension: Secondary | ICD-10-CM | POA: Diagnosis not present

## 2022-03-07 DIAGNOSIS — Z7952 Long term (current) use of systemic steroids: Secondary | ICD-10-CM | POA: Insufficient documentation

## 2022-03-07 DIAGNOSIS — C7989 Secondary malignant neoplasm of other specified sites: Secondary | ICD-10-CM

## 2022-03-07 DIAGNOSIS — C772 Secondary and unspecified malignant neoplasm of intra-abdominal lymph nodes: Secondary | ICD-10-CM | POA: Insufficient documentation

## 2022-03-07 DIAGNOSIS — K8010 Calculus of gallbladder with chronic cholecystitis without obstruction: Secondary | ICD-10-CM | POA: Insufficient documentation

## 2022-03-07 DIAGNOSIS — Z9221 Personal history of antineoplastic chemotherapy: Secondary | ICD-10-CM | POA: Diagnosis not present

## 2022-03-07 DIAGNOSIS — Z90710 Acquired absence of both cervix and uterus: Secondary | ICD-10-CM | POA: Diagnosis not present

## 2022-03-07 SURGERY — CHOLECYSTECTOMY, ROBOT-ASSISTED, LAPAROSCOPIC
Anesthesia: General

## 2022-03-07 MED ORDER — OXYCODONE HCL 5 MG PO TABS: 5.0000 mg | ORAL_TABLET | ORAL | 0 refills | Status: DC | PRN

## 2022-03-07 MED ORDER — FENTANYL CITRATE (PF) 100 MCG/2ML IJ SOLN
INTRAMUSCULAR | Status: AC
  Filled 2022-03-07: qty 2

## 2022-03-07 MED ORDER — FENTANYL CITRATE (PF) 100 MCG/2ML IJ SOLN
25.0000 ug | INTRAMUSCULAR | Status: DC | PRN
  Administered 2022-03-07 (×2): 25 ug via INTRAVENOUS

## 2022-03-07 MED ORDER — SUGAMMADEX SODIUM 200 MG/2ML IV SOLN
INTRAVENOUS | Status: DC | PRN
  Administered 2022-03-07: 200 mg via INTRAVENOUS

## 2022-03-07 MED ORDER — DEXAMETHASONE SODIUM PHOSPHATE 10 MG/ML IJ SOLN
INTRAMUSCULAR | Status: AC
  Filled 2022-03-07: qty 1

## 2022-03-07 MED ORDER — PHENYLEPHRINE 80 MCG/ML (10ML) SYRINGE FOR IV PUSH (FOR BLOOD PRESSURE SUPPORT)
PREFILLED_SYRINGE | INTRAVENOUS | Status: AC
  Filled 2022-03-07: qty 10

## 2022-03-07 MED ORDER — CEFAZOLIN SODIUM-DEXTROSE 2-4 GM/100ML-% IV SOLN
2.0000 g | INTRAVENOUS | Status: AC
  Administered 2022-03-07: 2 g via INTRAVENOUS

## 2022-03-07 MED ORDER — CHLORHEXIDINE GLUCONATE 0.12 % MT SOLN
OROMUCOSAL | Status: AC
  Administered 2022-03-07: 15 mL via OROMUCOSAL
  Filled 2022-03-07: qty 15

## 2022-03-07 MED ORDER — ONDANSETRON HCL 4 MG/2ML IJ SOLN: 4.0000 mg | Freq: Once | INTRAMUSCULAR | Status: DC | PRN

## 2022-03-07 MED ORDER — ACETAMINOPHEN 500 MG PO TABS: 1000.0000 mg | ORAL_TABLET | ORAL | Status: AC

## 2022-03-07 MED ORDER — PHENYLEPHRINE HCL (PRESSORS) 10 MG/ML IV SOLN
INTRAVENOUS | Status: DC | PRN
  Administered 2022-03-07: 80 ug via INTRAVENOUS
  Administered 2022-03-07 (×2): 160 ug via INTRAVENOUS
  Administered 2022-03-07 (×2): 80 ug via INTRAVENOUS

## 2022-03-07 MED ORDER — FAMOTIDINE 20 MG PO TABS
ORAL_TABLET | ORAL | Status: AC
  Administered 2022-03-07: 20 mg via ORAL
  Filled 2022-03-07: qty 1

## 2022-03-07 MED ORDER — GABAPENTIN 300 MG PO CAPS
ORAL_CAPSULE | ORAL | Status: AC
  Administered 2022-03-07: 300 mg via ORAL
  Filled 2022-03-07: qty 1

## 2022-03-07 MED ORDER — INDOCYANINE GREEN 25 MG IV SOLR
2.5000 mg | INTRAVENOUS | Status: AC
  Administered 2022-03-07: 2.5 mg via INTRAVENOUS
  Filled 2022-03-07: qty 1

## 2022-03-07 MED ORDER — ACETAMINOPHEN 500 MG PO TABS: 1000.0000 mg | ORAL_TABLET | Freq: Four times a day (QID) | ORAL | Status: DC | PRN

## 2022-03-07 MED ORDER — CHLORHEXIDINE GLUCONATE CLOTH 2 % EX PADS
6.0000 | MEDICATED_PAD | Freq: Once | CUTANEOUS | Status: AC
  Administered 2022-03-07: 6 via TOPICAL

## 2022-03-07 MED ORDER — GABAPENTIN 300 MG PO CAPS: 300.0000 mg | ORAL_CAPSULE | ORAL | Status: AC

## 2022-03-07 MED ORDER — FAMOTIDINE 20 MG PO TABS: 20.0000 mg | ORAL_TABLET | Freq: Once | ORAL | Status: AC

## 2022-03-07 MED ORDER — FENTANYL CITRATE (PF) 100 MCG/2ML IJ SOLN
INTRAMUSCULAR | Status: DC | PRN
  Administered 2022-03-07: 50 ug via INTRAVENOUS
  Administered 2022-03-07: 25 ug via INTRAVENOUS

## 2022-03-07 MED ORDER — OXYCODONE HCL 5 MG PO TABS
ORAL_TABLET | ORAL | Status: AC
  Filled 2022-03-07: qty 1

## 2022-03-07 MED ORDER — IBUPROFEN 600 MG PO TABS: 600.0000 mg | ORAL_TABLET | Freq: Three times a day (TID) | ORAL | 1 refills | Status: DC | PRN

## 2022-03-07 MED ORDER — OXYCODONE HCL 5 MG PO TABS
5.0000 mg | ORAL_TABLET | ORAL | Status: DC | PRN
  Administered 2022-03-07: 5 mg via ORAL

## 2022-03-07 MED ORDER — SODIUM CHLORIDE 0.9 % IV SOLN: INTRAVENOUS | Status: DC

## 2022-03-07 MED ORDER — ROCURONIUM BROMIDE 100 MG/10ML IV SOLN
INTRAVENOUS | Status: DC | PRN
  Administered 2022-03-07: 50 mg via INTRAVENOUS

## 2022-03-07 MED ORDER — LIDOCAINE HCL (CARDIAC) PF 100 MG/5ML IV SOSY
PREFILLED_SYRINGE | INTRAVENOUS | Status: DC | PRN
  Administered 2022-03-07: 100 mg via INTRAVENOUS

## 2022-03-07 MED ORDER — FENTANYL CITRATE (PF) 100 MCG/2ML IJ SOLN
INTRAMUSCULAR | Status: AC
  Administered 2022-03-07: 25 ug via INTRAVENOUS
  Filled 2022-03-07: qty 2

## 2022-03-07 MED ORDER — ACETAMINOPHEN 500 MG PO TABS
ORAL_TABLET | ORAL | Status: AC
  Administered 2022-03-07: 1000 mg via ORAL
  Filled 2022-03-07: qty 2

## 2022-03-07 MED ORDER — CHLORHEXIDINE GLUCONATE CLOTH 2 % EX PADS: 6.0000 | MEDICATED_PAD | Freq: Once | CUTANEOUS | Status: DC

## 2022-03-07 MED ORDER — MIDAZOLAM HCL 2 MG/2ML IJ SOLN
INTRAMUSCULAR | Status: DC | PRN
  Administered 2022-03-07: 2 mg via INTRAVENOUS

## 2022-03-07 MED ORDER — ONDANSETRON HCL 4 MG/2ML IJ SOLN
INTRAMUSCULAR | Status: AC
  Filled 2022-03-07: qty 2

## 2022-03-07 MED ORDER — PROPOFOL 10 MG/ML IV BOLUS
INTRAVENOUS | Status: DC | PRN
  Administered 2022-03-07: 150 mg via INTRAVENOUS

## 2022-03-07 MED ORDER — ORAL CARE MOUTH RINSE: 15.0000 mL | Freq: Once | OROMUCOSAL | Status: AC

## 2022-03-07 MED ORDER — PROPOFOL 10 MG/ML IV BOLUS
INTRAVENOUS | Status: AC
  Filled 2022-03-07: qty 20

## 2022-03-07 MED ORDER — CEFAZOLIN SODIUM-DEXTROSE 2-4 GM/100ML-% IV SOLN
INTRAVENOUS | Status: AC
  Filled 2022-03-07: qty 100

## 2022-03-07 MED ORDER — BUPIVACAINE-EPINEPHRINE (PF) 0.25% -1:200000 IJ SOLN
INTRAMUSCULAR | Status: DC | PRN
  Administered 2022-03-07: 30 mL

## 2022-03-07 MED ORDER — MIDAZOLAM HCL 2 MG/2ML IJ SOLN
INTRAMUSCULAR | Status: AC
  Filled 2022-03-07: qty 2

## 2022-03-07 MED ORDER — PROPOFOL 500 MG/50ML IV EMUL
INTRAVENOUS | Status: DC | PRN
  Administered 2022-03-07: 50 ug/kg/min via INTRAVENOUS

## 2022-03-07 MED ORDER — GLYCOPYRROLATE 0.2 MG/ML IJ SOLN
INTRAMUSCULAR | Status: DC | PRN
  Administered 2022-03-07: .2 mg via INTRAVENOUS

## 2022-03-07 MED ORDER — ONDANSETRON HCL 4 MG/2ML IJ SOLN
INTRAMUSCULAR | Status: DC | PRN
  Administered 2022-03-07: 4 mg via INTRAVENOUS

## 2022-03-07 MED ORDER — BUPIVACAINE-EPINEPHRINE (PF) 0.25% -1:200000 IJ SOLN
INTRAMUSCULAR | Status: AC
  Filled 2022-03-07: qty 30

## 2022-03-07 MED ORDER — CHLORHEXIDINE GLUCONATE 0.12 % MT SOLN: 15.0000 mL | Freq: Once | OROMUCOSAL | Status: AC

## 2022-03-07 MED ORDER — DEXAMETHASONE SODIUM PHOSPHATE 10 MG/ML IJ SOLN
INTRAMUSCULAR | Status: DC | PRN
  Administered 2022-03-07: 10 mg via INTRAVENOUS

## 2022-03-07 MED ORDER — EPHEDRINE 5 MG/ML INJ
INTRAVENOUS | Status: AC
  Filled 2022-03-07: qty 5

## 2022-03-07 MED ORDER — EPHEDRINE SULFATE (PRESSORS) 50 MG/ML IJ SOLN
INTRAMUSCULAR | Status: DC | PRN
  Administered 2022-03-07 (×3): 5 mg via INTRAVENOUS

## 2022-03-07 SURGICAL SUPPLY — 56 items
ADH SKN CLS APL DERMABOND .7 (GAUZE/BANDAGES/DRESSINGS) ×1
BAG PRESSURE INF REUSE 1000 (BAG) IMPLANT
CANNULA REDUC XI 12-8 STAPL (CANNULA) ×1
CANNULA REDUCER 12-8 DVNC XI (CANNULA) ×2 IMPLANT
CLIP LIGATING HEMO O LOK GREEN (MISCELLANEOUS) ×2 IMPLANT
COVER TIP SHEARS 8 DVNC (MISCELLANEOUS) IMPLANT
COVER TIP SHEARS 8MM DA VINCI (MISCELLANEOUS) ×1
CUP MEDICINE 2OZ PLAST GRAD ST (MISCELLANEOUS) ×2 IMPLANT
DERMABOND ADVANCED .7 DNX12 (GAUZE/BANDAGES/DRESSINGS) ×2 IMPLANT
DRAPE ARM DVNC X/XI (DISPOSABLE) ×8 IMPLANT
DRAPE COLUMN DVNC XI (DISPOSABLE) ×2 IMPLANT
DRAPE DA VINCI XI ARM (DISPOSABLE) ×4
DRAPE DA VINCI XI COLUMN (DISPOSABLE) ×1
ELECT CAUTERY BLADE TIP 2.5 (TIP) ×1
ELECT REM PT RETURN 9FT ADLT (ELECTROSURGICAL) ×1
ELECTRODE CAUTERY BLDE TIP 2.5 (TIP) ×2 IMPLANT
ELECTRODE REM PT RTRN 9FT ADLT (ELECTROSURGICAL) ×2 IMPLANT
GLOVE SURG SYN 7.0 (GLOVE) ×3 IMPLANT
GLOVE SURG SYN 7.0 PF PI (GLOVE) ×4 IMPLANT
GLOVE SURG SYN 7.5  E (GLOVE) ×3
GLOVE SURG SYN 7.5 E (GLOVE) ×3 IMPLANT
GLOVE SURG SYN 7.5 PF PI (GLOVE) ×4 IMPLANT
GOWN STRL REUS W/ TWL LRG LVL3 (GOWN DISPOSABLE) ×8 IMPLANT
GOWN STRL REUS W/TWL LRG LVL3 (GOWN DISPOSABLE) ×4
IRRIGATOR SUCT 8 DISP DVNC XI (IRRIGATION / IRRIGATOR) IMPLANT
IRRIGATOR SUCTION 8MM XI DISP (IRRIGATION / IRRIGATOR)
IV NS 1000ML (IV SOLUTION)
IV NS 1000ML BAXH (IV SOLUTION) IMPLANT
KIT PINK PAD W/HEAD ARE REST (MISCELLANEOUS) ×1
KIT PINK PAD W/HEAD ARM REST (MISCELLANEOUS) ×2 IMPLANT
LABEL OR SOLS (LABEL) ×2 IMPLANT
MANIFOLD NEPTUNE II (INSTRUMENTS) ×2 IMPLANT
NEEDLE HYPO 22GX1.5 SAFETY (NEEDLE) ×2 IMPLANT
NS IRRIG 500ML POUR BTL (IV SOLUTION) ×2 IMPLANT
OBTURATOR OPTICAL STANDARD 8MM (TROCAR) ×1
OBTURATOR OPTICAL STND 8 DVNC (TROCAR) ×1
OBTURATOR OPTICALSTD 8 DVNC (TROCAR) ×2 IMPLANT
PACK LAP CHOLECYSTECTOMY (MISCELLANEOUS) ×2 IMPLANT
PENCIL SMOKE EVACUATOR (MISCELLANEOUS) ×2 IMPLANT
SEAL CANN UNIV 5-8 DVNC XI (MISCELLANEOUS) ×6 IMPLANT
SEAL XI 5MM-8MM UNIVERSAL (MISCELLANEOUS) ×3
SET TUBE SMOKE EVAC HIGH FLOW (TUBING) ×2 IMPLANT
SOLUTION ELECTROLUBE (MISCELLANEOUS) ×2 IMPLANT
SPIKE FLUID TRANSFER (MISCELLANEOUS) ×2 IMPLANT
SPONGE T-LAP 18X18 ~~LOC~~+RFID (SPONGE) IMPLANT
SPONGE T-LAP 4X18 ~~LOC~~+RFID (SPONGE) ×2 IMPLANT
STAPLER CANNULA SEAL DVNC XI (STAPLE) ×2 IMPLANT
STAPLER CANNULA SEAL XI (STAPLE) ×1
SUT MNCRL AB 4-0 PS2 18 (SUTURE) ×2 IMPLANT
SUT VIC AB 3-0 SH 27 (SUTURE) ×1
SUT VIC AB 3-0 SH 27X BRD (SUTURE) IMPLANT
SUT VICRYL 0 UR6 27IN ABS (SUTURE) ×4 IMPLANT
SYS BAG RETRIEVAL 10MM (BASKET) ×1
SYSTEM BAG RETRIEVAL 10MM (BASKET) ×2 IMPLANT
TRAP FLUID SMOKE EVACUATOR (MISCELLANEOUS) ×2 IMPLANT
WATER STERILE IRR 500ML POUR (IV SOLUTION) ×2 IMPLANT

## 2022-03-07 NOTE — Anesthesia Preprocedure Evaluation (Signed)
Anesthesia Evaluation  Patient identified by MRN, date of birth, ID band Patient awake    Reviewed: Allergy & Precautions, NPO status , Patient's Chart, lab work & pertinent test results  History of Anesthesia Complications (+) PONV and history of anesthetic complications  Airway Mallampati: II  TM Distance: >3 FB Neck ROM: Full    Dental  (+) Partial Upper   Pulmonary neg pulmonary ROS   Pulmonary exam normal breath sounds clear to auscultation       Cardiovascular hypertension, negative cardio ROS Normal cardiovascular exam Rhythm:Regular Rate:Normal     Neuro/Psych negative neurological ROS  negative psych ROS   GI/Hepatic negative GI ROS, Neg liver ROS, hiatal hernia,GERD  Medicated,,  Endo/Other  negative endocrine ROS    Renal/GU      Musculoskeletal   Abdominal Normal abdominal exam  (+)   Peds  Hematology negative hematology ROS (+)   Anesthesia Other Findings Past Medical History: No date: Complication of anesthesia     Comment:  slow to wake up sometimes  No date: GERD (gastroesophageal reflux disease) No date: History of chemotherapy No date: History of hiatal hernia No date: Hypertension dx'd 12/2016: Ovarian cancer ([place])     Comment:  recurrent dz 02/2019 No date: PONV (postoperative nausea and vomiting)  Past Surgical History: 2018: ABDOMINAL HYSTERECTOMY 12/26/2016: DEBULKING; N/A     Comment:  Procedure: RADICAL TUMOR DEBULKING; OMENTECTOMY;                Surgeon: Everitt Amber, MD;  Location: [facility]S;  Service:               Gynecology;  Laterality: N/A; No date: ganglion cyst removed No date: Hital Hernia 01/16/2017: IR FLUORO GUIDE PORT INSERTION RIGHT 03/16/2019: IR IMAGING GUIDED PORT INSERTION 06/10/2017: IR REMOVAL TUN ACCESS W/ PORT W/O FL MOD SED 01/16/2017: IR US GUIDE VASC ACCESS RIGHT 12/26/2016: LAPAROTOMY; N/A     Comment:  Procedure: EXPLORATORY LAPAROTOMY;  Surgeon:  Everitt Amber, MD;  Location: [facility] ORS;  Service: Gynecology;                Laterality: N/A; No date: left morton's neuroma surgery  No date: Nissan Fundoplication  BMI    Body Mass Index: 25.63 kg/m      Reproductive/Obstetrics negative OB ROS                             Anesthesia Physical Anesthesia Plan  ASA: 2  Anesthesia Plan: General   Post-op Pain Management:    Induction: Intravenous  PONV Risk Score and Plan: 1 and Ondansetron and Dexamethasone  Airway Management Planned: Oral ETT  Additional Equipment:   Intra-op Plan:   Post-operative Plan: Extubation in OR  Informed Consent: I have reviewed the patients History and Physical, chart, labs and discussed the procedure including the risks, benefits and alternatives for the proposed anesthesia with the patient or authorized representative who has indicated his/her understanding and acceptance.     Dental Advisory Given  Plan Discussed with: CRNA and Surgeon  Anesthesia Plan Comments:        Anesthesia Quick Evaluation

## 2022-03-07 NOTE — Anesthesia Procedure Notes (Signed)
Procedure Name: Intubation Date/Time: 03/07/2022 9:43 AM  Performed by: Johnna Acosta, CRNAPre-anesthesia Checklist: Patient identified, Emergency Drugs available, Suction available, Patient being monitored and Timeout performed Patient Re-evaluated:Patient Re-evaluated prior to induction Oxygen Delivery Method: Circle system utilized Preoxygenation: Pre-oxygenation with 100% oxygen Induction Type: IV induction Ventilation: Mask ventilation without difficulty Laryngoscope Size: McGraph and 3 Grade View: Grade I Tube type: Oral Tube size: 7.0 mm Number of attempts: 1 Airway Equipment and Method: Stylet and Video-laryngoscopy Placement Confirmation: ETT inserted through vocal cords under direct vision, positive ETCO2 and breath sounds checked- equal and bilateral Secured at: 19 cm Tube secured with: Tape Dental Injury: Teeth and Oropharynx as per pre-operative assessment  Difficulty Due To: Difficult Airway- due to limited oral opening and Difficult Airway- due to dentition

## 2022-03-07 NOTE — Interval H&P Note (Signed)
History and Physical Interval Note:  03/07/2022 9:00 AM  Holly [name]  has presented today for surgery, with the diagnosis of symptomatic cholelithiasis.  The various methods of treatment have been discussed with the patient and family. After consideration of risks, benefits and other options for treatment, the patient has consented to  Procedure(s): XI ROBOTIC ASSISTED LAPAROSCOPIC CHOLECYSTECTOMY (N/A) INDOCYANINE GREEN FLUORESCENCE IMAGING (ICG) cholangiogram (N/A) as a surgical intervention.  The patient's history has been reviewed, patient examined, no change in status, stable for surgery.  I have reviewed the patient's chart and labs.  Questions were answered to the patient's satisfaction.     Jocelyn Lowery

## 2022-03-07 NOTE — Op Note (Signed)
Procedure Date:  03/07/2022  Pre-operative Diagnosis:  Symptomatic cholelithiasis  Post-operative Diagnosis: Symptomatic cholelithiasis, peritoneal implants with history of right fallopian tube cancer.  Procedure:   Robotic assisted cholecystectomy with ICG FireFly cholangiogram Robotic assisted abdominal wall peritoneal implant excisional biopsy x 3.  Surgeon:  Melvyn Neth, MD  Anesthesia:  General endotracheal  Estimated Blood Loss:  10 ml  Specimens:   Gallbladder Peritoneal implants x 3  Complications:  None  Indications for Procedure:  This is a 66 y.o. female who presents with abdominal pain and workup revealing symptomatic cholelithiasis.  The benefits, complications, treatment options, and expected outcomes were discussed with the patient. The risks of bleeding, infection, recurrence of symptoms, failure to resolve symptoms, bile duct damage, bile duct leak, retained common bile duct stone, bowel injury, and need for further procedures were all discussed with the patient and she was willing to proceed.  Description of Procedure: The patient was correctly identified in the preoperative area and brought into the operating room.  The patient was placed supine with VTE prophylaxis in place.  Appropriate time-outs were performed.  Anesthesia was induced and the patient was intubated.  Appropriate antibiotics were infused.  The abdomen was prepped and draped in a sterile fashion. An infraumbilical incision was made. A cutdown technique was used to enter the abdominal cavity without injury, and a 12 mm robotic port was inserted.  Pneumoperitoneum was obtained with appropriate opening pressures.  Three 8-mm ports were placed in the mid abdomen at the level of the umbilicus under direct visualization.  The patient was noted to have peritoneal implants from her history of right fallopian tube cancer.  Most of them were in the RUQ above the liver, and some others scattered around.   Pictures taken.  The DaVinci platform was docked, camera targeted, and instruments were placed under direct visualization.  The gallbladder was identified.  The fundus was grasped and retracted cephalad.  Adhesions were lysed bluntly and with electrocautery. The infundibulum was grasped and retracted laterally, exposing the peritoneum overlying the gallbladder.  This was incised with electrocautery and extended on either side of the gallbladder.  FireFly cholangiogram was then obtained, and we were able to clearly identify the cystic duct and common bile duct.  The cystic duct and cystic artery were carefully dissected with combination of cautery and blunt dissection.  Both were clipped twice proximally and once distally, cutting in between.  The gallbladder was taken from the gallbladder fossa in a retrograde fashion with electrocautery. We then proceeded with excisional biopsy of three peritoneal implants that were in the right upper quadrant.  This was done using scissors/cautery.  The implants and the gallbladder were placed in an Endocatch bag. The liver bed was inspected and any bleeding was controlled with electrocautery. The right upper quadrant was then inspected again revealing intact clips, no bleeding, and no ductal injury.  The 8 mm ports were removed under direct visualization and the 12 mm port was removed.  The Endocatch bag was brought out via the umbilical incision. The fascial opening was closed using 0 vicryl suture.  Local anesthetic was infused in all incisions and the incisions were closed with 4-0 Monocryl.  The wounds were cleaned and sealed with DermaBond.  The patient was emerged from anesthesia and extubated and brought to the recovery room for further management.  The patient tolerated the procedure well and all counts were correct at the end of the case.   Melvyn Neth, MD

## 2022-03-07 NOTE — Discharge Instructions (Signed)

## 2022-03-07 NOTE — Anesthesia Postprocedure Evaluation (Signed)
Anesthesia Post Note  Patient: Holly [name]  Procedure(s) Performed: XI ROBOTIC ASSISTED LAPAROSCOPIC CHOLECYSTECTOMY INDOCYANINE GREEN FLUORESCENCE IMAGING (ICG) cholangiogram  Patient location during evaluation: PACU Anesthesia Type: General Level of consciousness: awake Vital Signs Assessment: post-procedure vital signs reviewed and stable Respiratory status: spontaneous breathing Cardiovascular status: stable Anesthetic complications: no  No notable events documented.   Last Vitals:  Vitals:   03/07/22 1145 03/07/22 1200  BP: (!) 111/52   Pulse: 71 65  Resp: 14 14  Temp:    SpO2: 98% 99%    Last Pain:  Vitals:   03/07/22 1145  TempSrc:   PainSc: Asleep                 VAN STAVEREN,Ladavion Savitz

## 2022-03-07 NOTE — Transfer of Care (Signed)
Immediate Anesthesia Transfer of Care Note  Patient: Holly [name]  Procedure(s) Performed: XI ROBOTIC ASSISTED LAPAROSCOPIC CHOLECYSTECTOMY INDOCYANINE GREEN FLUORESCENCE IMAGING (ICG) cholangiogram  Patient Location: PACU  Anesthesia Type:General  Level of Consciousness: drowsy  Airway & Oxygen Therapy: Patient Spontanous Breathing and Patient connected to face mask oxygen  Post-op Assessment: Report given to RN and Post -op Vital signs reviewed and stable  Post vital signs: Reviewed and stable  Last Vitals:  Vitals Value Taken Time  BP 131/52 03/07/22 1140  Temp    Pulse 73 03/07/22 1142  Resp 16 03/07/22 1142  SpO2 99 % 03/07/22 1142  Vitals shown include unvalidated device data.  Last Pain:  Vitals:   03/07/22 0853  TempSrc: Temporal  PainSc: 0-No pain         Complications: No notable events documented.

## 2022-03-11 LAB — SURGICAL PATHOLOGY

## 2022-03-12 ENCOUNTER — Encounter: Payer: Self-pay | Admitting: Surgery

## 2022-03-18 ENCOUNTER — Telehealth: Payer: Self-pay

## 2022-03-18 NOTE — Telephone Encounter (Signed)
Called to offer appt on 12/8 at 2 pm to see Dr. Alvy Bimler. She declined appt due to going to beach 12/5 thru 12/14. Moved 12/15 appt to 1200 for 40 mins. Ask her to call the office back if her trip gets canceled.

## 2022-03-21 ENCOUNTER — Encounter: Payer: Self-pay | Admitting: Physician Assistant

## 2022-03-21 ENCOUNTER — Ambulatory Visit (INDEPENDENT_AMBULATORY_CARE_PROVIDER_SITE_OTHER): Payer: 59 | Admitting: Physician Assistant

## 2022-03-21 ENCOUNTER — Encounter: Payer: Self-pay | Admitting: Hematology and Oncology

## 2022-03-21 VITALS — BP 121/74 | HR 75 | Temp 98.0°F | Ht 65.0 in | Wt 155.0 lb

## 2022-03-21 DIAGNOSIS — Z09 Encounter for follow-up examination after completed treatment for conditions other than malignant neoplasm: Secondary | ICD-10-CM

## 2022-03-21 DIAGNOSIS — K8020 Calculus of gallbladder without cholecystitis without obstruction: Secondary | ICD-10-CM

## 2022-03-21 NOTE — Progress Notes (Signed)
[facility] POST-OP OFFICE VISIT  03/21/2022  HPI: Holly [name] is a 66 y.o. female 14 days s/p robotic assisted laparoscopic cholecystectomy for symptomatic cholelithiasis as well as robotic assisted laparoscopic c excisional biopsy of peritoneal implants x3 with Dr Hampton Abbot   She has done well No significant pain No fever, chills, nausea, emesis She is tolerating PO; no restrictions; no diarrhea Incisions are well healed   Vital signs: BP 121/74   Pulse 75   Temp 98 F (36.7 C)   Ht '5\' 5"'$  (1.651 m)   Wt 155 lb (70.3 kg)   SpO2 95%   BMI 25.79 kg/m    Physical Exam: Constitutional: Well appearing female, NAD Abdomen: Soft, non-tender, non-distended, no rebound/guarding Skin: Laparoscopic incisions are healing well, no erythema or drainage   Assessment/Plan: This is a 66 y.o. female 14 days s/p robotic assisted laparoscopic cholecystectomy for symptomatic cholelithiasis as well as robotic assisted laparoscopic c excisional biopsy of peritoneal implants x3 with Dr Hampton Abbot    - Pain control prn  - Reviewed wound care recommendation  - Reviewed lifting restrictions; 4 weeks total  - Surgical pathology was reviewed on the phone last week; She will see Dr Alvy Bimler on 12/15  - She can follow up on as needed basis; She understands to call with questions/concerns. She will need to follow up in Jan/Feb to discuss potential ventral hernia surgery   -- Edison Simon, PA-C [facility] 03/21/2022, 2:52 PM M-F: 7am - 4pm

## 2022-03-21 NOTE — Patient Instructions (Signed)

## 2022-03-24 ENCOUNTER — Encounter: Payer: Self-pay | Admitting: Hematology and Oncology

## 2022-03-25 ENCOUNTER — Telehealth: Payer: Self-pay

## 2022-03-25 NOTE — Telephone Encounter (Signed)
Called and scheduled appt on 12/7 ay 1:20 pm, she is aware of appt.

## 2022-03-25 NOTE — Telephone Encounter (Signed)
-----   Message from Heath Lark, MD sent at 03/25/2022  9:12 AM EST ----- I can see her at 120 pm on Thursday for 30 mins

## 2022-03-28 ENCOUNTER — Inpatient Hospital Stay: Payer: Medicare Other | Attending: Hematology and Oncology | Admitting: Hematology and Oncology

## 2022-03-28 ENCOUNTER — Other Ambulatory Visit: Payer: Self-pay

## 2022-03-28 ENCOUNTER — Encounter: Payer: Self-pay | Admitting: Hematology and Oncology

## 2022-03-28 VITALS — BP 139/57 | HR 69 | Resp 18 | Ht 65.0 in | Wt 153.2 lb

## 2022-03-28 DIAGNOSIS — C5701 Malignant neoplasm of right fallopian tube: Secondary | ICD-10-CM

## 2022-03-28 DIAGNOSIS — N183 Chronic kidney disease, stage 3 unspecified: Secondary | ICD-10-CM

## 2022-03-28 DIAGNOSIS — Z5111 Encounter for antineoplastic chemotherapy: Secondary | ICD-10-CM | POA: Insufficient documentation

## 2022-03-28 DIAGNOSIS — C7989 Secondary malignant neoplasm of other specified sites: Secondary | ICD-10-CM | POA: Diagnosis not present

## 2022-03-28 DIAGNOSIS — Z7189 Other specified counseling: Secondary | ICD-10-CM

## 2022-03-28 DIAGNOSIS — I10 Essential (primary) hypertension: Secondary | ICD-10-CM

## 2022-03-28 DIAGNOSIS — C786 Secondary malignant neoplasm of retroperitoneum and peritoneum: Secondary | ICD-10-CM | POA: Insufficient documentation

## 2022-03-28 MED ORDER — ONDANSETRON HCL 8 MG PO TABS: 8.0000 mg | ORAL_TABLET | Freq: Three times a day (TID) | ORAL | 1 refills | Status: DC | PRN

## 2022-03-28 MED ORDER — DEXAMETHASONE 4 MG PO TABS: ORAL_TABLET | ORAL | 6 refills | Status: DC

## 2022-03-28 MED ORDER — TRAMADOL HCL 50 MG PO TABS: 50.0000 mg | ORAL_TABLET | Freq: Four times a day (QID) | ORAL | 0 refills | Status: DC | PRN

## 2022-03-28 MED ORDER — PROCHLORPERAZINE MALEATE 10 MG PO TABS: 10.0000 mg | ORAL_TABLET | Freq: Four times a day (QID) | ORAL | 1 refills | Status: DC | PRN

## 2022-03-28 NOTE — Assessment & Plan Note (Addendum)
I have reviewed surgical findings and pathology report I recommend we repeat CT imaging of the abdomen and pelvis to establish new baseline I reviewed the guidelines with the patient and her husband We discussed risk, benefits, side effects of combination treatment of carboplatin with paclitaxel, carboplatin with Doxil, carboplatin with gemcitabine, with and without bevacizumab  Ultimately, she is in agreement to resume treatment with carboplatin and paclitaxel, bevacizumab to be added second cycle and beyond to allow further time for wound recovery from recent cholecystectomy We discussed the importance of premedication to avoid allergic reaction to therapy I recommend minimum 3 cycles of treatment before repeating CT imaging We will monitor tumor marker with each dose of therapy  I will also prescribe lower dose of bevacizumab given prior intolerance and difficulties controlling her blood pressure

## 2022-03-28 NOTE — Assessment & Plan Note (Signed)
We will adjust the dose of her carboplatin accordingly

## 2022-03-28 NOTE — Progress Notes (Signed)
[facility] OFF[facility] PROGRESS NOTE  Patient Care Team: Gaynelle Arabian, MD as PCP - General (Family Medicine) Heath Lark, MD as Consulting Physician (Hematology and Oncology) Everitt Amber, MD as Consulting Physician (Obstetrics and Gynecology)  ASSESSMENT & PLAN:  Adenocarcinoma of right fallopian tube [facility]) I have reviewed surgical findings and pathology report I recommend we repeat CT imaging of the abdomen and pelvis to establish new baseline I reviewed the guidelines with the patient and her husband We discussed risk, benefits, side effects of combination treatment of carboplatin with paclitaxel, carboplatin with Doxil, carboplatin with gemcitabine, with and without bevacizumab  Ultimately, she is in agreement to resume treatment with carboplatin and paclitaxel, bevacizumab to be added second cycle and beyond to allow further time for wound recovery from recent cholecystectomy We discussed the importance of premedication to avoid allergic reaction to therapy I recommend minimum 3 cycles of treatment before repeating CT imaging We will monitor tumor marker with each dose of therapy  I will also prescribe lower dose of bevacizumab given prior intolerance and difficulties controlling her blood pressure  CKD (chronic kidney disease), stage III ([place]) We will adjust the dose of her carboplatin accordingly  Essential hypertension She will continue close monitoring of her blood pressure while on treatment  Orders Placed This Encounter  Procedures   CT ABDOMEN PELVIS W CONTRAST    Standing Status:   Future    Standing Expiration Date:   03/29/2023    Order Specific Question:   If indicated for the ordered procedure, I authorize the administration of contrast media per Radiology protocol    Answer:   Yes    Order Specific Question:   Preferred imaging location?    Answer:   [facility]    Order Specific Question:   Radiology Contrast Protocol - do NOT remove file  path    Answer:   [email or web address]   CBC with Differential (Can[facility] Center Only)    Standing Status:   Future    Standing Expiration Date:   04/09/2023   CMP ([place] only)    Standing Status:   Future    Standing Expiration Date:   04/09/2023   CBC with Differential ([place] Only)    Standing Status:   Future    Standing Expiration Date:   04/30/2023   CMP ([place] only)    Standing Status:   Future    Standing Expiration Date:   04/30/2023   CBC with Differential ([place] Only)    Standing Status:   Future    Standing Expiration Date:   05/21/2023   CMP ([place] only)    Standing Status:   Future    Standing Expiration Date:   05/21/2023   Total Protein, Urine dipstick    Standing Status:   Future    Standing Expiration Date:   05/21/2023   CBC with Differential ([place] Only)    Standing Status:   Future    Standing Expiration Date:   06/11/2023   CMP ([place] only)    Standing Status:   Future    Standing Expiration Date:   06/11/2023   CBC with Differential ([place] Only)    Standing Status:   Future    Standing Expiration Date:   07/02/2023   CMP ([place] only)    Standing Status:   Future    Standing Expiration Date:   07/02/2023   CBC with Differential ([place] Only)    Standing  Status:   Future    Standing Expiration Date:   07/23/2023   CMP ([place] only)    Standing Status:   Future    Standing Expiration Date:   07/23/2023   Total Protein, Urine dipstick    Standing Status:   Future    Standing Expiration Date:   07/23/2023   CBC with Differential ([facility] Only)    Standing Status:   Future    Standing Expiration Date:   08/13/2023   CMP ([place] only)    Standing Status:   Future    Standing Expiration Date:   08/13/2023   CA 125    Standing Status:   Future    Standing Expiration Date:   04/09/2023   Total Protein, Urine dipstick    Standing Status:    Future    Standing Expiration Date:   04/30/2023   CA 125    Standing Status:   Future    Standing Expiration Date:   05/21/2023   CA 125    Standing Status:   Future    Standing Expiration Date:   07/02/2023   CA 125    Standing Status:   Future    Standing Expiration Date:   08/13/2023    All questions were answered. The patient knows to call the clinic with any problems, questions or concerns. The total time spent in the appointment was 40 minutes encounter with patients including review of chart and various tests results, discussions about plan of care and coordination of care plan   Heath Lark, MD 03/28/2022 2:34 PM  INTERVAL HISTORY: Please see below for problem oriented charting. she returns for treatment follow-up with her husband We reviewed surgical report and pathology report We spent a lot of time reviewing different treatment options and side effects to be expected She has minimal pain at the surgical incision site but overall healing well  REVIEW OF SYSTEMS:   Constitutional: Denies fevers, chills or abnormal weight loss Eyes: Denies blurriness of vision Ears, nose, mouth, throat, and face: Denies mucositis or sore throat Respiratory: Denies cough, dyspnea or wheezes Cardiovascular: Denies palpitation, chest discomfort or lower extremity swelling Gastrointestinal:  Denies nausea, heartburn or change in bowel habits Skin: Denies abnormal skin rashes Lymphatics: Denies new lymphadenopathy or easy bruising Neurological:Denies numbness, tingling or new weaknesses Behavioral/Psych: Mood is stable, no new changes  All other systems were reviewed with the patient and are negative.  I have reviewed the past medical history, past surgical history, social history and family history with the patient and they are unchanged from previous note.  ALLERGIES:  is allergic to dilaudid [hydromorphone hcl], morphine and related, and sudafed [pseudoephedrine hcl].  MEDICATIONS:   Current Outpatient Medications  Medication Sig Dispense Refill   dexamethasone (DECADRON) 4 MG tablet Take 2 tabs at the night before and 2 tab the morning of chemotherapy, every 3 weeks, by mouth x 6 cycles 36 tablet 6   traMADol (ULTRAM) 50 MG tablet Take 1 tablet (50 mg total) by mouth every 6 (six) hours as needed. 30 tablet 0   acetaminophen (TYLENOL) 325 MG tablet Take 650 mg by mouth every 6 (six) hours as needed for headache (pain).     acetaminophen (TYLENOL) 500 MG tablet Take 2 tablets (1,000 mg total) by mouth every 6 (six) hours as needed for mild pain.     atenolol (TENORMIN) 25 MG tablet TAKE 1 TABLET BY MOUTH EVERY DAY 30 tablet 2   cloNIDine (CATAPRES)  0.1 MG tablet TAKE 1 TABLET BY MOUTH EVERY DAY 30 tablet 1   doxazosin (CARDURA) 4 MG tablet TAKE 1 TABLET BY MOUTH EVERY DAY 30 tablet 1   hydrochlorothiazide (HYDRODIURIL) 25 MG tablet TAKE 1 TABLET (25 MG TOTAL) BY MOUTH DAILY. 30 tablet 3   lidocaine-prilocaine (EMLA) cream Apply to affected area once daily as directed. (Patient taking differently: 1 application  as needed (access port). Apply to affected area once daily as directed.) 30 g 3   lisinopril (ZESTRIL) 20 MG tablet TAKE 1 TABLET BY MOUTH EVERY DAY IN THE EVENING 60 tablet 0   ondansetron (ZOFRAN) 8 MG tablet Take 1 tablet (8 mg total) by mouth every 8 (eight) hours as needed for nausea or vomiting. Start on the third day after chemotherapy. 30 tablet 1   prochlorperazine (COMPAZINE) 10 MG tablet Take 1 tablet (10 mg total) by mouth every 6 (six) hours as needed for nausea or vomiting. 30 tablet 1   venlafaxine XR (EFFEXOR-XR) 37.5 MG 24 hr capsule TAKE 1 CAPSULE BY MOUTH DAILY WITH BREAKFAST 90 capsule 3   No current facility-administered medications for this visit.   Facility-Administered Medications Ordered in Other Visits  Medication Dose Route Frequency Provider Last Rate Last Admin   sodium chloride flush (NS) 0.9 % injection 10 mL  10 mL Intravenous PRN  Alvy Bimler, Jerita Wimbush, MD   10 mL at 02/11/17 0839   sodium chloride flush (NS) 0.9 % injection 10 mL  10 mL Intravenous PRN Alvy Bimler, Delina Kruczek, MD   10 mL at 03/04/17 1510    SUMMARY OF ONCOLOGIC HISTORY: Oncology History Overview Note  Neg genetics High grade serous    Adenocarcinoma of right fallopian tube ([place])  08/12/2016 Initial Diagnosis   The patient has many months of vague symptoms of fullness in the pelvis, urinary frequency and difficulty with defecation. She denies vaginal bleeding or rectal bleeding.     08/12/2016 Imaging   Abdomen X-ray in the ER: Moderate colonic stool burden without evidence of enteric obstruction   11/13/2016 Imaging   TVUS was performed on 11/13/16 which showed a large hypoechoic lobular mass seen posterior to the uterus measuring 18.2x16.1x11.8cm, blood flow was seen along the periphery of the mass. It was considered to be likely to be a fibroid vs pelvic mass vs ovarian mass. Neither ovary was removed. Moderate amount of free fluid in the pelvis. THe uterus measures 4x10x4cm. The endometrium is 87m.    12/05/2016 Imaging   MR pelvis 1. Mild limitations as detailed above. 2. Heterogeneous pelvic mass is favored to arise from the right ovary. Favor a solid ovarian neoplasm such as fibroma/Brenner's tumor. Given lesion size and heterogeneous T2 signal out, complicating torsion cannot be excluded. 3. Moderate abdominopelvic ascites, without specific evidence of peritoneal metastasis. Consider further evaluation with contrast-enhanced abdominopelvic CT.    12/26/2016 Pathology Results   1. Uterus, ovaries and fallopian tubes - ADENOCARCINOMA INVOLVING RIGHT FALLOPIAN TUBE, RIGHT AND LEFT OVARIES, UTERINE SEROSA AND PELVIC MASS. - SEE ONCOLOGY TABLE AND COMMENT. - UTERINE CERVIX, ENDOMETRIUM, MYOMETRIUM AND LEFT FALLOPIAN TUBE FREE OF TUMOR. 2. Omentum, resection for tumor - ADENOCARCINOMA. Microscopic Comment 1. ONCOLOGY TABLE - FALLOPIAN TUBE. SEE COMMENT 1.  Specimen, including laterality: Uterus, bilateral adnexa, pelvic mass and omentum. 2. Procedure: Hysterectomy with bilateral salpingo-oophorectomy, pelvic mass excision and omentectomy. 3. Lymph node sampling performed: No 4. Tumor site: See comment. 5. Tumor location in fallopian tube: Right fallopian tube fimbria 6. Specimen integrity (intact/ruptured/disrupted): Intact 7. Tumor size (  cm): 18 cm, see comment. 8. Histologic type: Adenocarcinoma 9. Grade: High grade 10. Microscopic tumor extension: Tumor involves right fallopian tube, right and left ovaries, uterine serosa, pelvic mass and omentum. 11. Margins: See comment. 12. Lymph-Vascular invasion: Present 13. Lymph nodes: # examined: 0; # positive: N/A 14. TNM: pT3c, pNX 15. FIGO Stage (based on pathologic findings, needs clinical correlation: III-C 16. Comments: There is an 18 cm pelvic mass which is a high grade adenocarcinoma and there is a 12.2 cm  segment of omentum which is extensively involved with adenocarcinoma. The tumor also involves the fimbria of the right fallopian tube and the parenchyma of the right and left ovaries as well as uterine serosa. The fimbria of the right fallopian has intraepithelial atypia consistent with a precursor lesion and therefore this is most consistent with primary fallopian tube adenocarcinoma. A primary peritoneal serous carcinoma is a less likely possibility.    12/26/2016 Pathology Results   PERITONEAL/ASCITIC FLUID(SPECIMEN 1 OF 1 COLLECTED 12/26/16): POORLY DIFFERENTIATED ADENOCARCINOMA   12/26/2016 Surgery   Procedure(s) Performed: Exploratory laparotomy with total abdominal hysterectomy, bilateral salpingo-oophorectomy, omentectomy radical tumor debulking for ovarian cancer .   Surgeon: Thereasa Solo, MD.      Operative Findings: 20cm left ovarian mass densely adherent to the posterior uterus, right tube and ovary, cervix, sigmoid colon. 10cm omental cake. 4L ascites. 750m size tumor nodules on  the serosa of the terminal ileum and proximal sigmoid colon. 182mnodules on right diaphragm.    This represented an optimal cytoreduction (R1) with 50m59modules on intestine and diaphragm representing gross visible disease   01/16/2017 Procedure   Placement of single lumen port a cath via right internal jugular vein. The catheter tip lies at the cavo-atrial junction. A power injectable port a cath was placed and is ready for immediate use.   01/17/2017 Imaging   1. 3.5 x 7.2 x 4.6 cm fluid collection along the vaginal cuff, posterior the bladder and extending into the right adnexal space. This lesion demonstrates rim enhancement. Imaging features could be related to a loculated postoperative seroma or hematoma. Superinfection cannot be excluded by CT. Given debulking surgery was 3 weeks ago, this entire structure is un likely to represent neoplasm, but peritoneal involvement could have this appearance. 2. Small fluid collection in the left para colic gutter without rim enhancement. 3. Irregular/nodular appearance of the peritoneal M in the anatomic pelvis with areas of subtle nodularity between the stomach and the spleen. Close attention in these regions on follow-up recommended as metastatic disease is a concern. 4. Mildly enlarged hepatoduodenal ligament lymph node. Attention on follow-up recommended.   01/20/2017 Tumor Marker   Patient's tumor was tested for the following markers: CA-125 Results of the tumor marker test revealed 46.6   01/28/2017 Genetic Testing       Negative genetic testing on the [facility].  The [facility] panel offered by [employer] sequencing and deletion/duplication testing of the following 28 genes: APC, ATM, BARD1, BMPR1A, BRCA1, BRCA2, BRIP1, CHD1, CDK4, CDKN2A, CHEK2, EPCAM (large rearrangement only), MLH1, MSH2, MSH6, MUTYH, NBN, PALB2, PMS2, PTEN, RAD51C, RAD51D, SMAD4, STK11, and TP53. Sequencing was performed for select regions of  POLE and POLD1, and large rearrangement analysis was performed for select regions of GREM1. The report date is January 28, 2017.  HRD testing looking for genomic instability and BRCA mutations was negative.  The report date of this test is January 27, 2017.    01/31/2017 Tumor Marker  Patient's tumor was tested for the following markers: CA-125 Results of the tumor marker test revealed 22.4   03/04/2017 Tumor Marker   Patient's tumor was tested for the following markers: CA-125 Results of the tumor marker test revealed 13.8   04/18/2017 Tumor Marker   Patient's tumor was tested for the following markers: CA-125 Results of the tumor marker test revealed 11.9   05/05/2017 Tumor Marker   Patient's tumor was tested for the following markers: CA-125 Results of the tumor marker test revealed 12   05/26/2017 Tumor Marker   Patient's tumor was tested for the following markers: CA-125 Results of the tumor marker test revealed 10.6   05/26/2017 Imaging   1. A previously noted postoperative fluid collection in the low pelvis and residual ascites seen on the prior examination has resolved on today's study. No findings to suggest residual/recurrent disease on today's examination. No definite solid organ metastasis identified in the abdomen or pelvis. No lymphadenopathy. 2. Aortic atherosclerosis.   06/10/2017 Procedure   Successful right IJ vein Port-A-Cath explant.   03/09/2018 Tumor Marker   Patient's tumor was tested for the following markers: CA-125 Results of the tumor marker test revealed 12.1   05/26/2018 Tumor Marker   Patient's tumor was tested for the following markers: CA-125 Results of the tumor marker test revealed 13.8   09/09/2018 Tumor Marker   Patient's tumor was tested for the following markers: CA-125 Results of the tumor marker test revealed 23.9   12/18/2018 Tumor Marker   Patient's tumor was tested for the following markers: CA-125 Results of the tumor marker test revealed  43.8   02/27/2019 - 02/28/2019 Hospital Admission   She was admitted for bowel obstruction   02/27/2019 Imaging   1. High-grade small bowel obstruction with transition point in the pelvis in an area of suspected desmoplastic reaction surrounding a 1.3 cm spiculated nodule in the mesentery, suspicious for carcinoid. There is hyperenhancement near the tip of the appendix and loss of the normal fat plane between it and the adjacent sigmoid colon, potentially the site of primary lesion. 2. Multiple new small subcentimeter hyperdense lesions scattered along the liver capsule, concerning for metastases. Enlarged hyperenhancing gastrohepatic and portacaval lymph nodes concerning for nodal metastases. 3. Very mild right hydroureteronephrosis to the level of the desmoplastic reaction in the pelvis, potentially involving the right ureter. 4. Infraumbilical ventral abdominal wall diastasis containing a small nondilated portion of transverse colon. 5. Trace ascites. 6. Cholelithiasis. 7.  Aortic atherosclerosis (ICD10-I70.0).     03/05/2019 Tumor Marker   Patient's tumor was tested for the following markers: CA-125 Results of the tumor marker test revealed 70.1   03/12/2019 Echocardiogram   IMPRESSIONS     1. Left ventricular ejection fraction, by visual estimation, is 60 to 65%. The left ventricle has normal function. There is no left ventricular hypertrophy.  2. Left ventricular diastolic parameters are consistent with Grade I diastolic dysfunction (impaired relaxation).  3. Global right ventricle has normal systolic function.The right ventricular size is normal. No increase in right ventricular wall thickness.  4. Left atrial size was normal.  5. Right atrial size was normal.  6. The pericardium was not assessed.  7. The mitral valve is normal in structure. No evidence of mitral valve regurgitation.  8. The tricuspid valve is normal in structure. Tricuspid valve regurgitation is trivial.  9. The  aortic valve is normal in structure. Aortic valve regurgitation is not visualized. 10. The pulmonic valve was grossly normal. Pulmonic valve regurgitation  is not visualized. 11. The average left ventricular global longitudinal strain is -17.6 %.   03/16/2019 Procedure   Successful placement of a right internal jugular approach power injectable Port-A-Cath. The catheter is ready for immediate use.     04/19/2019 Tumor Marker   Patient's tumor was tested for the following markers: CA-125 Results of the tumor marker test revealed 39.8   05/17/2019 Tumor Marker   Patient's tumor was tested for the following markers: CA-125 Results of the tumor marker test revealed 27   05/28/2019 Tumor Marker   Patient's tumor was tested for the following markers: CA-125 Results of the tumor marker test revealed 27.7.   06/11/2019 Imaging   1. No new or progressive metastatic disease in the abdomen or pelvis. 2. Tiny noncalcified perisplenic implant is decreased. Calcified pericardiophrenic, retroperitoneal and bilateral inguinal lymph nodes and scattered small calcified perihepatic and perisplenic implants are stable. 3.  Aortic Atherosclerosis (ICD10-I70.0).       06/17/2019 Echocardiogram    1. Left ventricular ejection fraction, by estimation, is 65 to 70%. The left ventricle has normal function. The left ventricle has no regional wall motion abnormalities. Left ventricular diastolic parameters were normal.  2. Right ventricular systolic function is normal. The right ventricular size is normal.  3. The mitral valve is normal in structure and function. No evidence of mitral valve regurgitation. No evidence of mitral stenosis.  4. The aortic valve is normal in structure and function. Aortic valve regurgitation is not visualized. No aortic stenosis is present.   06/21/2019 Tumor Marker   Patient's tumor was tested for the following markers: CA-125 Results of the tumor marker test revealed 18.7   07/19/2019  Tumor Marker   Patient's tumor was tested for the following markers: CA-125 Results of the tumor marker test revealed 16.7   08/30/2019 Tumor Marker   Patient's tumor was tested for the following markers: CA-125. Results of the tumor marker test revealed 13.9   09/15/2019 Echocardiogram    1. Left ventricular ejection fraction, by estimation, is 65 to 70%. The left ventricle has normal function. The left ventricle has no regional wall motion abnormalities. Left ventricular diastolic parameters are consistent with Grade I diastolic dysfunction (impaired relaxation). The average left ventricular global longitudinal strain is 18.1 %. The global longitudinal strain is normal.  2. Right ventricular systolic function is normal. The right ventricular size is normal.  3. The mitral valve is normal in structure. No evidence of mitral valve regurgitation. No evidence of mitral stenosis.  4. The aortic valve is normal in structure. Aortic valve regurgitation is not visualized. No aortic stenosis is present.  5. The inferior vena cava is normal in size with greater than 50% respiratory variability, suggesting right atrial pressure of 3 mmHg.     09/24/2019 Imaging   1. Stable exam. No new or progressive metastatic disease on today's study. 2. Small subcapsular hypodensity in the lateral spleen, new in the interval, but indeterminate. Attention on follow-up recommended. 3. The calcified upper abdominal and groin lymph nodes are stable as are the scattered calcified and noncalcified peritoneal implants. 4. Stable midline ventral hernia containing a short segment of colon without complicating features. 5. Aortic Atherosclerosis (ICD10-I70.0).     10/04/2019 - 09/21/2021 Chemotherapy   Patient is on Treatment Plan : ovarian Bevacizumab q21d     10/04/2019 Tumor Marker   Patient's tumor was tested for the following markers: CA-125. Results of the tumor marker test revealed 12.9.   11/01/2019 Tumor Marker  Patient's tumor was tested for the following markers: CA-125 Results of the tumor marker test revealed 15.4   12/13/2019 Tumor Marker   Patient's tumor was tested for the following markers: CA-125 Results of the tumor marker test revealed 14.8   12/31/2019 Imaging   1. Unchanged tiny peritoneal nodule adjacent to the spleen measuring 6 mm. Other previously noted peritoneal nodules are imperceptible on present examination. 2. Unchanged prominent, partially calcified portacaval lymph node and bilateral inguinal lymph nodes. 3. Unchanged subtle, nonspecific subcapsular lesion of the spleen.  4. No evidence of new metastatic disease in the abdomen or pelvis. 5. Status post hysterectomy. 6. Unchanged low midline ventral abdominal hernia containing a single loop of nonobstructed transverse colon. 7. Cholelithiasis. 8. Aortic Atherosclerosis (ICD10-I70.0).       01/03/2020 Tumor Marker   Patient's tumor was tested for the following markers: CA-125 Results of the tumor marker test revealed 11.7.   01/24/2020 Tumor Marker   Patient's tumor was tested for the following markers: CA-125. Results of the tumor marker test revealed 15.1   03/06/2020 Tumor Marker   Patient's tumor was tested for the following markers: CA-125. Results of the tumor marker test revealed 20.3   03/27/2020 Tumor Marker   Patient's tumor was tested for the following markers: CA-125 Results of the tumor marker test revealed 23.7   05/11/2020 Tumor Marker   Patient's tumor was tested for the following markers: CA-125. Results of the tumor marker test revealed 25.4   06/23/2020 Imaging   1. Cholelithiasis with gallbladder wall thickening and mild infiltrative edema in the porta hepatis, raising the possibility of acute cholecystitis. There is truncation of the common bile duct distally and distal choledocholithiasis is difficult to exclude. Correlate with bilirubin levels. If clinically warranted, MRCP could be utilized for  further characterization. 2. Stable tiny perisplenic nodule. 3. Other imaging findings of potential clinical significance: Small type 1 hiatal hernia. Prominent stool throughout the colon favors constipation. Ventral infraumbilical hernia contains transverse colon without findings of strangulation or obstruction. Small supraumbilical hernia contains adipose tissue. Lumbar degenerative disc disease at L5-S1. Stable tiny nodule along the lateral margin of the spleen, nonspecific. 4. Aortic atherosclerosis.     12/15/2020 Imaging   No evidence of recurrent or metastatic carcinoma within the abdomen or pelvis.   Stable ventral hernia containing transverse colon. No evidence of bowel obstruction or strangulation.   Cholelithiasis. No radiographic evidence of cholecystitis.   Tiny hiatal hernia.   Aortic Atherosclerosis (ICD10-I70.0).     06/11/2021 Imaging   1. Numerous sub 4 mm pulmonary nodules in the lungs some of which have a cavitary appearance. I think it is unlikely this is metastatic disease and more likely a possible immunotherapy related process such as a sarcoid like reaction. Recommend full chest CT further evaluation. 2. Stable small calcified retroperitoneal and mesenteric lymph nodes. No findings suspicious for recurrent peritoneal carcinomatosis. 3. Stable lower abdominal wall hernia containing part of the transverse colon. 4. Cholelithiasis.   09/27/2021 Imaging   1. Numerous small pulmonary nodules throughout the lungs, the majority of which are cavitary. These are all subcentimeter, however nodules that were seen in the bilateral lung bases on prior CT dated 06/08/2021 appear slightly increased in size and number. Behavior is highly suspicious for pulmonary metastatic disease despite the unusual appearance, however as previously reported general differential considerations include atypical infection and inflammation, such as drug-induced sarcoidosis like reaction.  2. Unchanged  calcified lymph nodes in the abdomen and pelvis, consistent with  treated nodal metastases. 3. Cholelithiasis with wall thickening and pericholecystic fluid. This appearance is similar to prior examination and suggests chronic cholecystitis. Correlate for clinical symptoms. Mild intrahepatic biliary ductal dilatation, similar to prior examination. 4. Midline ventral hernia containing a single nonobstructed loop of mid transverse colon.     12/25/2021 Imaging   1. Again noted are multiple thin walled cavitary lung nodules throughout both lungs. These are similar in size and multiplicity when compared with the exam from 09/26/2021. Etiology remains indeterminate, although metastatic disease cannot be excluded with a high degree of certainty. 2. Stable calcified upper abdominal lymph nodes consistent with treated nodal metastases. 3. Gallstones. 4. Aortic Atherosclerosis (ICD10-I70.0).   01/30/2022 Tumor Marker   Patient's tumor was tested for the following markers: CA-125. Results of the tumor marker test revealed 59.3.   01/31/2022 Imaging   1. No substantial interval change in exam. 2. Innumerable tiny cavitary pulmonary nodules in the lung bases. As noted previously, metastatic disease would be distinct consideration although imaging features are nonspecific and infectious/inflammatory etiology is not excluded. 3. Calcified nodal disease in the upper abdomen and pelvis shows no substantial interval change. Scattered noncalcified nodules in the mesentery are similar to prior. 4. Markedly large stool volume throughout the colon. Imaging features suggestive of clinical constipation. 5. Cholelithiasis with ill-defined appearance of the gallbladder wall. Appearance is stable in the interval. 6. Trace free fluid in the cul-de-sac. 7. Midline ventral hernia contains a short segment of transverse colon without complicating features. 8. Aortic Atherosclerosis (ICD10-I70.0).   03/07/2022 Surgery    Procedure Date:  03/07/2022  Pre-operative Diagnosis:  Symptomatic cholelithiasis   Post-operative Diagnosis: Symptomatic cholelithiasis, peritoneal implants with history of right fallopian tube cancer.   Procedure:   Robotic assisted cholecystectomy with ICG FireFly cholangiogram Robotic assisted abdominal wall peritoneal implant excisional biopsy x 3.   Surgeon:  Melvyn Neth, MD  Specimens:   Gallbladder Peritoneal implants x 3    Indications for Procedure:  This is a 66 y.o. female who presents with abdominal pain and workup revealing symptomatic cholelithiasis.  The benefits, complications, treatment options, and expected outcomes were discussed with the patient. The risks of bleeding, infection, recurrence of symptoms, failure to resolve symptoms, bile duct damage, bile duct leak, retained common bile duct stone, bowel injury, and need for further procedures were all discussed with the patient and she was willing to proceed.     03/07/2022 Pathology Results   SURGICAL PATHOLOGY  CASE: [id number]  PATIENT: Holly [name]  Surgical Pathology Report   Specimen Submitted:  A. Gallbladder  B. Abdominal wall   Clinical History: Symptomatic cholelithiasis   DIAGNOSIS:  A. GALLBLADDER; CHOLECYSTECTOMY:  - CHRONIC CHOLECYSTITIS WITH CHOLELITHIASIS.  - NEGATIVE FOR DYSPLASIA AND MALIGNANCY.  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   B. SOFT TISSUE, ABDOMINAL WALL; BIOPSY:  - POSITIVE FOR MALIGNANCY.  - METASTATIC CARCINOMA, COMPATIBLE WITH HIGH-GRADE SEROUS CARCINOMA OF GYNECOLOGIC PRIMARY.   Comment:  Immunohistochemical studies were performed on neoplastic cells in both sources. Tumor cells in both are positive for CK7, Pax-8, and WT-1, supporting the above diagnosis. P53 demonstrates strong and diffuse expression, indicative of mutated expression pattern.   There is sufficient tissue  present for ancillary molecular testing if desired.    04/08/2022 -  Chemotherapy   Patient is on Treatment Plan : OVARIAN Carboplatin + Paclitaxel + Bevacizumab q21d        PHYSICAL EXAMINATION: ECOG PERFORMANCE  STATUS: 1 - Symptomatic but completely ambulatory  Vitals:   03/28/22 1330  BP: (!) 139/57  Pulse: 69  Resp: 18  SpO2: 99%   Filed Weights   03/28/22 1330  Weight: 153 lb 3.2 oz (69.5 kg)    GENERAL:alert, no distress and comfortable NEURO: alert & oriented x 3 with fluent speech, no focal motor/sensory deficits  LABORATORY DATA:  I have reviewed the data as listed    Component Value Date/Time   NA 138 03/06/2022 1158   NA 139 04/14/2017 0742   K 4.3 03/06/2022 1158   K 4.0 04/14/2017 0742   CL 105 03/06/2022 1158   CO2 23 03/06/2022 1158   CO2 21 (L) 04/14/2017 0742   GLUCOSE 121 (H) 03/06/2022 1158   GLUCOSE 247 (H) 04/14/2017 0742   BUN 24 (H) 03/06/2022 1158   BUN 13.1 04/14/2017 0742   CREATININE 1.35 (H) 03/06/2022 1158   CREATININE 0.99 03/12/2021 0832   CREATININE 0.9 04/14/2017 0742   CALCIUM 9.0 03/06/2022 1158   CALCIUM 9.2 04/14/2017 0742   PROT 6.0 (L) 01/29/2022 1411   PROT 7.3 04/14/2017 0742   ALBUMIN 3.5 01/29/2022 1411   ALBUMIN 3.9 04/14/2017 0742   AST 13 (L) 01/29/2022 1411   AST 18 03/12/2021 0832   AST 17 04/14/2017 0742   ALT 6 01/29/2022 1411   ALT 9 03/12/2021 0832   ALT 14 04/14/2017 0742   ALKPHOS 50 01/29/2022 1411   ALKPHOS 70 04/14/2017 0742   BILITOT 0.4 01/29/2022 1411   BILITOT 0.8 03/12/2021 0832   BILITOT 0.37 04/14/2017 0742   GFRNONAA 43 (L) 03/06/2022 1158   GFRNONAA >60 03/12/2021 0832   GFRAA >60 01/24/2020 0924   GFRAA >60 07/19/2019 0951    No results found for: "SPEP", "UPEP"  Lab Results  Component Value Date   WBC 5.9 01/29/2022   NEUTROABS 3.8 01/29/2022   HGB 11.3 (L) 01/29/2022   HCT 34.0 (L) 01/29/2022   MCV 93.2 01/29/2022   PLT 160 01/29/2022      Chemistry      Component Value  Date/Time   NA 138 03/06/2022 1158   NA 139 04/14/2017 0742   K 4.3 03/06/2022 1158   K 4.0 04/14/2017 0742   CL 105 03/06/2022 1158   CO2 23 03/06/2022 1158   CO2 21 (L) 04/14/2017 0742   BUN 24 (H) 03/06/2022 1158   BUN 13.1 04/14/2017 0742   CREATININE 1.35 (H) 03/06/2022 1158   CREATININE 0.99 03/12/2021 0832   CREATININE 0.9 04/14/2017 0742      Component Value Date/Time   CALCIUM 9.0 03/06/2022 1158   CALCIUM 9.2 04/14/2017 0742   ALKPHOS 50 01/29/2022 1411   ALKPHOS 70 04/14/2017 0742   AST 13 (L) 01/29/2022 1411   AST 18 03/12/2021 0832   AST 17 04/14/2017 0742   ALT 6 01/29/2022 1411   ALT 9 03/12/2021 0832   ALT 14 04/14/2017 0742   BILITOT 0.4 01/29/2022 1411   BILITOT 0.8 03/12/2021 0832   BILITOT 0.37 04/14/2017 0742

## 2022-03-28 NOTE — Assessment & Plan Note (Signed)
She will continue close monitoring of her blood pressure while on treatment

## 2022-03-28 NOTE — Progress Notes (Signed)
DISCONTINUE OFF PATHWAY REGIMEN - Ovarian   [id number]:Bevacizumab 15 mg/kg q21d:   A cycle is every 21 days:     Bevacizumab-xxxx   **Always confirm dose/schedule in your pharmacy ordering system**  REASON: Other Reason PRIOR TREATMENT: Off Pathway: Bevacizumab 15 mg/kg q21d TREATMENT RESPONSE: Stable Disease (SD)  START ON PATHWAY REGIMEN - Ovarian     A cycle is every 21 days:     Bevacizumab-xxxx      Paclitaxel      Carboplatin   **Always confirm dose/schedule in your pharmacy ordering system**  Patient Characteristics: Recurrent or Progressive Disease, Third Line, Platinum Sensitive and ? 6 Months Since Last Platinum Therapy BRCA Mutation Status: Absent Therapeutic Status: Recurrent or Progressive Disease Line of Therapy: Third Line  Intent of Therapy: Non-Curative / Palliative Intent, Discussed with Patient

## 2022-03-29 ENCOUNTER — Telehealth: Payer: Self-pay

## 2022-03-29 ENCOUNTER — Encounter: Payer: Self-pay | Admitting: Hematology and Oncology

## 2022-03-29 ENCOUNTER — Other Ambulatory Visit: Payer: Self-pay

## 2022-03-29 NOTE — Telephone Encounter (Signed)
Left message discussing appointment changes, Pt advised to call back to verbalize understanding.

## 2022-03-29 NOTE — Telephone Encounter (Signed)
Called Pt back and she verbalized understanding of appt changes. Also discussed picking up contrast before CT appointment.

## 2022-04-03 ENCOUNTER — Other Ambulatory Visit: Payer: Self-pay

## 2022-04-04 ENCOUNTER — Encounter (HOSPITAL_BASED_OUTPATIENT_CLINIC_OR_DEPARTMENT_OTHER): Payer: Self-pay

## 2022-04-04 ENCOUNTER — Ambulatory Visit (HOSPITAL_BASED_OUTPATIENT_CLINIC_OR_DEPARTMENT_OTHER)
Admission: RE | Admit: 2022-04-04 | Discharge: 2022-04-04 | Disposition: A | Discharge status: Home / Self Care | Payer: Medicare Other | Source: Ambulatory Visit | Attending: Hematology and Oncology | Admitting: Hematology and Oncology

## 2022-04-04 ENCOUNTER — Inpatient Hospital Stay (HOSPITAL_BASED_OUTPATIENT_CLINIC_OR_DEPARTMENT_OTHER): Payer: Medicare Other | Admitting: Hematology and Oncology

## 2022-04-04 ENCOUNTER — Encounter: Payer: Self-pay | Admitting: Hematology and Oncology

## 2022-04-04 ENCOUNTER — Other Ambulatory Visit: Payer: Self-pay

## 2022-04-04 ENCOUNTER — Other Ambulatory Visit: Payer: Self-pay | Admitting: *Deleted

## 2022-04-04 VITALS — BP 91/58 | HR 73 | Resp 18 | Ht 65.0 in | Wt 154.4 lb

## 2022-04-04 DIAGNOSIS — Z5111 Encounter for antineoplastic chemotherapy: Secondary | ICD-10-CM | POA: Diagnosis not present

## 2022-04-04 DIAGNOSIS — C5701 Malignant neoplasm of right fallopian tube: Secondary | ICD-10-CM | POA: Diagnosis not present

## 2022-04-04 DIAGNOSIS — I10 Essential (primary) hypertension: Secondary | ICD-10-CM

## 2022-04-04 MED ORDER — IOHEXOL 300 MG/ML  SOLN
100.0000 mL | Freq: Once | INTRAMUSCULAR | Status: AC | PRN
  Administered 2022-04-04: 60 mL via INTRAVENOUS

## 2022-04-04 NOTE — Assessment & Plan Note (Signed)
Her blood pressure is now low I recommend the patient to stop clonidine and we will continue to monitor her blood pressure carefully In the future, I anticipate her blood pressure might go back out when we resume bevacizumab

## 2022-04-04 NOTE — Assessment & Plan Note (Signed)
I have reviewed multiple imaging studies with the patient and her husband Unfortunately, the most recent CT imaging showed evidence of disease relapse She is scheduled for chemotherapy next week I recommend minimum treatment for 3 months before repeating another CT imaging At this point, she is considered permanently disabled as she will likely need to remain on treatment for the rest of her life I completed application for permanent disability parking permit I will see her after the new year prior to second dose of therapy She will start treatment without bevacizumab for the first cycle to allow further recovery from recent surgery

## 2022-04-04 NOTE — Progress Notes (Signed)
[place] OFFICE PROGRESS NOTE  Patient Care Team: Gaynelle Arabian, MD as PCP - General (Family Medicine) Heath Lark, MD as Consulting Physician (Hematology and Oncology) Everitt Amber, MD as Consulting Physician (Obstetrics and Gynecology)  ASSESSMENT & PLAN:  Adenocarcinoma of right fallopian tube [facility]) I have reviewed multiple imaging studies with the patient and her husband Unfortunately, the most recent CT imaging showed evidence of disease relapse She is scheduled for chemotherapy next week I recommend minimum treatment for 3 months before repeating another CT imaging At this point, she is considered permanently disabled as she will likely need to remain on treatment for the rest of her life I completed application for permanent disability parking permit I will see her after the new year prior to second dose of therapy She will start treatment without bevacizumab for the first cycle to allow further recovery from recent surgery  Essential hypertension Her blood pressure is now low I recommend the patient to stop clonidine and we will continue to monitor her blood pressure carefully In the future, I anticipate her blood pressure might go back out when we resume bevacizumab  No orders of the defined types were placed in this encounter.   All questions were answered. The patient knows to call the clinic with any problems, questions or concerns. The total time spent in the appointment was 30 minutes encounter with patients including review of chart and various tests results, discussions about plan of care and coordination of care plan   Heath Lark, MD 04/04/2022 2:00 PM  INTERVAL HISTORY: Please see below for problem oriented charting. she returns for treatment follow-up with her husband She brought with her documentation of her blood pressure over the past few days Majority of her blood pressure is low Thankfully, she is not symptomatic We spent majority of  her time reviewing test results and review imaging studies  REVIEW OF SYSTEMS:   Constitutional: Denies fevers, chills or abnormal weight loss Eyes: Denies blurriness of vision Ears, nose, mouth, throat, and face: Denies mucositis or sore throat Respiratory: Denies cough, dyspnea or wheezes Cardiovascular: Denies palpitation, chest discomfort or lower extremity swelling Gastrointestinal:  Denies nausea, heartburn or change in bowel habits Skin: Denies abnormal skin rashes Lymphatics: Denies new lymphadenopathy or easy bruising Neurological:Denies numbness, tingling or new weaknesses Behavioral/Psych: Mood is stable, no new changes  All other systems were reviewed with the patient and are negative.  I have reviewed the past medical history, past surgical history, social history and family history with the patient and they are unchanged from previous note.  ALLERGIES:  is allergic to dilaudid [hydromorphone hcl], morphine and related, and sudafed [pseudoephedrine hcl].  MEDICATIONS:  Current Outpatient Medications  Medication Sig Dispense Refill   acetaminophen (TYLENOL) 325 MG tablet Take 650 mg by mouth every 6 (six) hours as needed for headache (pain).     acetaminophen (TYLENOL) 500 MG tablet Take 2 tablets (1,000 mg total) by mouth every 6 (six) hours as needed for mild pain.     atenolol (TENORMIN) 25 MG tablet TAKE 1 TABLET BY MOUTH EVERY DAY 30 tablet 2   cloNIDine (CATAPRES) 0.1 MG tablet TAKE 1 TABLET BY MOUTH EVERY DAY 30 tablet 1   dexamethasone (DECADRON) 4 MG tablet Take 2 tabs at the night before and 2 tab the morning of chemotherapy, every 3 weeks, by mouth x 6 cycles 36 tablet 6   doxazosin (CARDURA) 4 MG tablet TAKE 1 TABLET BY MOUTH EVERY DAY 30 tablet 1  hydrochlorothiazide (HYDRODIURIL) 25 MG tablet TAKE 1 TABLET (25 MG TOTAL) BY MOUTH DAILY. 30 tablet 3   lidocaine-prilocaine (EMLA) cream Apply to affected area once daily as directed. (Patient taking differently: 1  application  as needed (access port). Apply to affected area once daily as directed.) 30 g 3   lisinopril (ZESTRIL) 20 MG tablet TAKE 1 TABLET BY MOUTH EVERY DAY IN THE EVENING 60 tablet 0   ondansetron (ZOFRAN) 8 MG tablet Take 1 tablet (8 mg total) by mouth every 8 (eight) hours as needed for nausea or vomiting. Start on the third day after chemotherapy. 30 tablet 1   prochlorperazine (COMPAZINE) 10 MG tablet Take 1 tablet (10 mg total) by mouth every 6 (six) hours as needed for nausea or vomiting. 30 tablet 1   traMADol (ULTRAM) 50 MG tablet Take 1 tablet (50 mg total) by mouth every 6 (six) hours as needed. 30 tablet 0   venlafaxine XR (EFFEXOR-XR) 37.5 MG 24 hr capsule TAKE 1 CAPSULE BY MOUTH DAILY WITH BREAKFAST 90 capsule 3   No current facility-administered medications for this visit.   Facility-Administered Medications Ordered in Other Visits  Medication Dose Route Frequency Provider Last Rate Last Admin   sodium chloride flush (NS) 0.9 % injection 10 mL  10 mL Intravenous PRN Alvy Bimler, Sylvia Helms, MD   10 mL at 02/11/17 0839   sodium chloride flush (NS) 0.9 % injection 10 mL  10 mL Intravenous PRN Alvy Bimler, Aniello Christopoulos, MD   10 mL at 03/04/17 1510    SUMMARY OF ONCOLOGIC HISTORY: Oncology History Overview Note  Neg genetics High grade serous    Adenocarcinoma of right fallopian tube ([place])  08/12/2016 Initial Diagnosis   The patient has many months of vague symptoms of fullness in the pelvis, urinary frequency and difficulty with defecation. She denies vaginal bleeding or rectal bleeding.     08/12/2016 Imaging   Abdomen X-ray in the ER: Moderate colonic stool burden without evidence of enteric obstruction   11/13/2016 Imaging   TVUS was performed on 11/13/16 which showed a large hypoechoic lobular mass seen posterior to the uterus measuring 18.2x16.1x11.8cm, blood flow was seen along the periphery of the mass. It was considered to be likely to be a fibroid vs pelvic mass vs ovarian mass. Neither  ovary was removed. Moderate amount of free fluid in the pelvis. THe uterus measures 4x10x4cm. The endometrium is 4m.    12/05/2016 Imaging   MR pelvis 1. Mild limitations as detailed above. 2. Heterogeneous pelvic mass is favored to arise from the right ovary. Favor a solid ovarian neoplasm such as fibroma/Brenner's tumor. Given lesion size and heterogeneous T2 signal out, complicating torsion cannot be excluded. 3. Moderate abdominopelvic ascites, without specific evidence of peritoneal metastasis. Consider further evaluation with contrast-enhanced abdominopelvic CT.    12/26/2016 Pathology Results   1. Uterus, ovaries and fallopian tubes - ADENOCARCINOMA INVOLVING RIGHT FALLOPIAN TUBE, RIGHT AND LEFT OVARIES, UTERINE SEROSA AND PELVIC MASS. - SEE ONCOLOGY TABLE AND COMMENT. - UTERINE CERVIX, ENDOMETRIUM, MYOMETRIUM AND LEFT FALLOPIAN TUBE FREE OF TUMOR. 2. Omentum, resection for tumor - ADENOCARCINOMA. Microscopic Comment 1. ONCOLOGY TABLE - FALLOPIAN TUBE. SEE COMMENT 1. Specimen, including laterality: Uterus, bilateral adnexa, pelvic mass and omentum. 2. Procedure: Hysterectomy with bilateral salpingo-oophorectomy, pelvic mass excision and omentectomy. 3. Lymph node sampling performed: No 4. Tumor site: See comment. 5. Tumor location in fallopian tube: Right fallopian tube fimbria 6. Specimen integrity (intact/ruptured/disrupted): Intact 7. Tumor size (cm): 18 cm, see comment. 8. Histologic type:  Adenocarcinoma 9. Grade: High grade 10. Microscopic tumor extension: Tumor involves right fallopian tube, right and left ovaries, uterine serosa, pelvic mass and omentum. 11. Margins: See comment. 12. Lymph-Vascular invasion: Present 13. Lymph nodes: # examined: 0; # positive: N/A 14. TNM: pT3c, pNX 15. FIGO Stage (based on pathologic findings, needs clinical correlation: III-C 16. Comments: There is an 18 cm pelvic mass which is a high grade adenocarcinoma and there is a 12.2 cm   segment of omentum which is extensively involved with adenocarcinoma. The tumor also involves the fimbria of the right fallopian tube and the parenchyma of the right and left ovaries as well as uterine serosa. The fimbria of the right fallopian has intraepithelial atypia consistent with a precursor lesion and therefore this is most consistent with primary fallopian tube adenocarcinoma. A primary peritoneal serous carcinoma is a less likely possibility.    12/26/2016 Pathology Results   PERITONEAL/ASCITIC FLUID(SPECIMEN 1 OF 1 COLLECTED 12/26/16): POORLY DIFFERENTIATED ADENOCARCINOMA   12/26/2016 Surgery   Procedure(s) Performed: Exploratory laparotomy with total abdominal hysterectomy, bilateral salpingo-oophorectomy, omentectomy radical tumor debulking for ovarian cancer .   Surgeon: Thereasa Solo, MD.      Operative Findings: 20cm left ovarian mass densely adherent to the posterior uterus, right tube and ovary, cervix, sigmoid colon. 10cm omental cake. 4L ascites. 7m size tumor nodules on the serosa of the terminal ileum and proximal sigmoid colon. 175mnodules on right diaphragm.    This represented an optimal cytoreduction (R1) with 103m43modules on intestine and diaphragm representing gross visible disease   01/16/2017 Procedure   Placement of single lumen port a cath via right internal jugular vein. The catheter tip lies at the cavo-atrial junction. A power injectable port a cath was placed and is ready for immediate use.   01/17/2017 Imaging   1. 3.5 x 7.2 x 4.6 cm fluid collection along the vaginal cuff, posterior the bladder and extending into the right adnexal space. This lesion demonstrates rim enhancement. Imaging features could be related to a loculated postoperative seroma or hematoma. Superinfection cannot be excluded by CT. Given debulking surgery was 3 weeks ago, this entire structure is un likely to represent neoplasm, but peritoneal involvement could have this appearance. 2. Small fluid  collection in the left para colic gutter without rim enhancement. 3. Irregular/nodular appearance of the peritoneal M in the anatomic pelvis with areas of subtle nodularity between the stomach and the spleen. Close attention in these regions on follow-up recommended as metastatic disease is a concern. 4. Mildly enlarged hepatoduodenal ligament lymph node. Attention on follow-up recommended.   01/20/2017 Tumor Marker   Patient's tumor was tested for the following markers: CA-125 Results of the tumor marker test revealed 46.6   01/28/2017 Genetic Testing       Negative genetic testing on the [facility].  The [facility] panel offered by [employer] sequencing and deletion/duplication testing of the following 28 genes: APC, ATM, BARD1, BMPR1A, BRCA1, BRCA2, BRIP1, CHD1, CDK4, CDKN2A, CHEK2, EPCAM (large rearrangement only), MLH1, MSH2, MSH6, MUTYH, NBN, PALB2, PMS2, PTEN, RAD51C, RAD51D, SMAD4, STK11, and TP53. Sequencing was performed for select regions of POLE and POLD1, and large rearrangement analysis was performed for select regions of GREM1. The report date is January 28, 2017.  HRD testing looking for genomic instability and BRCA mutations was negative.  The report date of this test is January 27, 2017.    01/31/2017 Tumor Marker   Patient's tumor was tested for the following  markers: CA-125 Results of the tumor marker test revealed 22.4   03/04/2017 Tumor Marker   Patient's tumor was tested for the following markers: CA-125 Results of the tumor marker test revealed 13.8   04/18/2017 Tumor Marker   Patient's tumor was tested for the following markers: CA-125 Results of the tumor marker test revealed 11.9   05/05/2017 Tumor Marker   Patient's tumor was tested for the following markers: CA-125 Results of the tumor marker test revealed 12   05/26/2017 Tumor Marker   Patient's tumor was tested for the following markers: CA-125 Results of the tumor marker  test revealed 10.6   05/26/2017 Imaging   1. A previously noted postoperative fluid collection in the low pelvis and residual ascites seen on the prior examination has resolved on today's study. No findings to suggest residual/recurrent disease on today's examination. No definite solid organ metastasis identified in the abdomen or pelvis. No lymphadenopathy. 2. Aortic atherosclerosis.   06/10/2017 Procedure   Successful right IJ vein Port-A-Cath explant.   03/09/2018 Tumor Marker   Patient's tumor was tested for the following markers: CA-125 Results of the tumor marker test revealed 12.1   05/26/2018 Tumor Marker   Patient's tumor was tested for the following markers: CA-125 Results of the tumor marker test revealed 13.8   09/09/2018 Tumor Marker   Patient's tumor was tested for the following markers: CA-125 Results of the tumor marker test revealed 23.9   12/18/2018 Tumor Marker   Patient's tumor was tested for the following markers: CA-125 Results of the tumor marker test revealed 43.8   02/27/2019 - 02/28/2019 Hospital Admission   She was admitted for bowel obstruction   02/27/2019 Imaging   1. High-grade small bowel obstruction with transition point in the pelvis in an area of suspected desmoplastic reaction surrounding a 1.3 cm spiculated nodule in the mesentery, suspicious for carcinoid. There is hyperenhancement near the tip of the appendix and loss of the normal fat plane between it and the adjacent sigmoid colon, potentially the site of primary lesion. 2. Multiple new small subcentimeter hyperdense lesions scattered along the liver capsule, concerning for metastases. Enlarged hyperenhancing gastrohepatic and portacaval lymph nodes concerning for nodal metastases. 3. Very mild right hydroureteronephrosis to the level of the desmoplastic reaction in the pelvis, potentially involving the right ureter. 4. Infraumbilical ventral abdominal wall diastasis containing a small nondilated portion  of transverse colon. 5. Trace ascites. 6. Cholelithiasis. 7.  Aortic atherosclerosis (ICD10-I70.0).     03/05/2019 Tumor Marker   Patient's tumor was tested for the following markers: CA-125 Results of the tumor marker test revealed 70.1   03/12/2019 Echocardiogram   IMPRESSIONS     1. Left ventricular ejection fraction, by visual estimation, is 60 to 65%. The left ventricle has normal function. There is no left ventricular hypertrophy.  2. Left ventricular diastolic parameters are consistent with Grade I diastolic dysfunction (impaired relaxation).  3. Global right ventricle has normal systolic function.The right ventricular size is normal. No increase in right ventricular wall thickness.  4. Left atrial size was normal.  5. Right atrial size was normal.  6. The pericardium was not assessed.  7. The mitral valve is normal in structure. No evidence of mitral valve regurgitation.  8. The tricuspid valve is normal in structure. Tricuspid valve regurgitation is trivial.  9. The aortic valve is normal in structure. Aortic valve regurgitation is not visualized. 10. The pulmonic valve was grossly normal. Pulmonic valve regurgitation is not visualized. 11. The average left  ventricular global longitudinal strain is -17.6 %.   03/16/2019 Procedure   Successful placement of a right internal jugular approach power injectable Port-A-Cath. The catheter is ready for immediate use.     04/19/2019 Tumor Marker   Patient's tumor was tested for the following markers: CA-125 Results of the tumor marker test revealed 39.8   05/17/2019 Tumor Marker   Patient's tumor was tested for the following markers: CA-125 Results of the tumor marker test revealed 27   05/28/2019 Tumor Marker   Patient's tumor was tested for the following markers: CA-125 Results of the tumor marker test revealed 27.7.   06/11/2019 Imaging   1. No new or progressive metastatic disease in the abdomen or pelvis. 2. Tiny  noncalcified perisplenic implant is decreased. Calcified pericardiophrenic, retroperitoneal and bilateral inguinal lymph nodes and scattered small calcified perihepatic and perisplenic implants are stable. 3.  Aortic Atherosclerosis (ICD10-I70.0).       06/17/2019 Echocardiogram    1. Left ventricular ejection fraction, by estimation, is 65 to 70%. The left ventricle has normal function. The left ventricle has no regional wall motion abnormalities. Left ventricular diastolic parameters were normal.  2. Right ventricular systolic function is normal. The right ventricular size is normal.  3. The mitral valve is normal in structure and function. No evidence of mitral valve regurgitation. No evidence of mitral stenosis.  4. The aortic valve is normal in structure and function. Aortic valve regurgitation is not visualized. No aortic stenosis is present.   06/21/2019 Tumor Marker   Patient's tumor was tested for the following markers: CA-125 Results of the tumor marker test revealed 18.7   07/19/2019 Tumor Marker   Patient's tumor was tested for the following markers: CA-125 Results of the tumor marker test revealed 16.7   08/30/2019 Tumor Marker   Patient's tumor was tested for the following markers: CA-125. Results of the tumor marker test revealed 13.9   09/15/2019 Echocardiogram    1. Left ventricular ejection fraction, by estimation, is 65 to 70%. The left ventricle has normal function. The left ventricle has no regional wall motion abnormalities. Left ventricular diastolic parameters are consistent with Grade I diastolic dysfunction (impaired relaxation). The average left ventricular global longitudinal strain is 18.1 %. The global longitudinal strain is normal.  2. Right ventricular systolic function is normal. The right ventricular size is normal.  3. The mitral valve is normal in structure. No evidence of mitral valve regurgitation. No evidence of mitral stenosis.  4. The aortic valve is  normal in structure. Aortic valve regurgitation is not visualized. No aortic stenosis is present.  5. The inferior vena cava is normal in size with greater than 50% respiratory variability, suggesting right atrial pressure of 3 mmHg.     09/24/2019 Imaging   1. Stable exam. No new or progressive metastatic disease on today's study. 2. Small subcapsular hypodensity in the lateral spleen, new in the interval, but indeterminate. Attention on follow-up recommended. 3. The calcified upper abdominal and groin lymph nodes are stable as are the scattered calcified and noncalcified peritoneal implants. 4. Stable midline ventral hernia containing a short segment of colon without complicating features. 5. Aortic Atherosclerosis (ICD10-I70.0).     10/04/2019 - 09/21/2021 Chemotherapy   Patient is on Treatment Plan : ovarian Bevacizumab q21d     10/04/2019 Tumor Marker   Patient's tumor was tested for the following markers: CA-125. Results of the tumor marker test revealed 12.9.   11/01/2019 Tumor Marker   Patient's tumor was tested for  the following markers: CA-125 Results of the tumor marker test revealed 15.4   12/13/2019 Tumor Marker   Patient's tumor was tested for the following markers: CA-125 Results of the tumor marker test revealed 14.8   12/31/2019 Imaging   1. Unchanged tiny peritoneal nodule adjacent to the spleen measuring 6 mm. Other previously noted peritoneal nodules are imperceptible on present examination. 2. Unchanged prominent, partially calcified portacaval lymph node and bilateral inguinal lymph nodes. 3. Unchanged subtle, nonspecific subcapsular lesion of the spleen.  4. No evidence of new metastatic disease in the abdomen or pelvis. 5. Status post hysterectomy. 6. Unchanged low midline ventral abdominal hernia containing a single loop of nonobstructed transverse colon. 7. Cholelithiasis. 8. Aortic Atherosclerosis (ICD10-I70.0).       01/03/2020 Tumor Marker   Patient's tumor  was tested for the following markers: CA-125 Results of the tumor marker test revealed 11.7.   01/24/2020 Tumor Marker   Patient's tumor was tested for the following markers: CA-125. Results of the tumor marker test revealed 15.1   03/06/2020 Tumor Marker   Patient's tumor was tested for the following markers: CA-125. Results of the tumor marker test revealed 20.3   03/27/2020 Tumor Marker   Patient's tumor was tested for the following markers: CA-125 Results of the tumor marker test revealed 23.7   05/11/2020 Tumor Marker   Patient's tumor was tested for the following markers: CA-125. Results of the tumor marker test revealed 25.4   06/23/2020 Imaging   1. Cholelithiasis with gallbladder wall thickening and mild infiltrative edema in the porta hepatis, raising the possibility of acute cholecystitis. There is truncation of the common bile duct distally and distal choledocholithiasis is difficult to exclude. Correlate with bilirubin levels. If clinically warranted, MRCP could be utilized for further characterization. 2. Stable tiny perisplenic nodule. 3. Other imaging findings of potential clinical significance: Small type 1 hiatal hernia. Prominent stool throughout the colon favors constipation. Ventral infraumbilical hernia contains transverse colon without findings of strangulation or obstruction. Small supraumbilical hernia contains adipose tissue. Lumbar degenerative disc disease at L5-S1. Stable tiny nodule along the lateral margin of the spleen, nonspecific. 4. Aortic atherosclerosis.     12/15/2020 Imaging   No evidence of recurrent or metastatic carcinoma within the abdomen or pelvis.   Stable ventral hernia containing transverse colon. No evidence of bowel obstruction or strangulation.   Cholelithiasis. No radiographic evidence of cholecystitis.   Tiny hiatal hernia.   Aortic Atherosclerosis (ICD10-I70.0).     06/11/2021 Imaging   1. Numerous sub 4 mm pulmonary nodules in the  lungs some of which have a cavitary appearance. I think it is unlikely this is metastatic disease and more likely a possible immunotherapy related process such as a sarcoid like reaction. Recommend full chest CT further evaluation. 2. Stable small calcified retroperitoneal and mesenteric lymph nodes. No findings suspicious for recurrent peritoneal carcinomatosis. 3. Stable lower abdominal wall hernia containing part of the transverse colon. 4. Cholelithiasis.   09/27/2021 Imaging   1. Numerous small pulmonary nodules throughout the lungs, the majority of which are cavitary. These are all subcentimeter, however nodules that were seen in the bilateral lung bases on prior CT dated 06/08/2021 appear slightly increased in size and number. Behavior is highly suspicious for pulmonary metastatic disease despite the unusual appearance, however as previously reported general differential considerations include atypical infection and inflammation, such as drug-induced sarcoidosis like reaction.  2. Unchanged calcified lymph nodes in the abdomen and pelvis, consistent with treated nodal metastases. 3. Cholelithiasis  with wall thickening and pericholecystic fluid. This appearance is similar to prior examination and suggests chronic cholecystitis. Correlate for clinical symptoms. Mild intrahepatic biliary ductal dilatation, similar to prior examination. 4. Midline ventral hernia containing a single nonobstructed loop of mid transverse colon.     12/25/2021 Imaging   1. Again noted are multiple thin walled cavitary lung nodules throughout both lungs. These are similar in size and multiplicity when compared with the exam from 09/26/2021. Etiology remains indeterminate, although metastatic disease cannot be excluded with a high degree of certainty. 2. Stable calcified upper abdominal lymph nodes consistent with treated nodal metastases. 3. Gallstones. 4. Aortic Atherosclerosis (ICD10-I70.0).   01/30/2022 Tumor Marker    Patient's tumor was tested for the following markers: CA-125. Results of the tumor marker test revealed 59.3.   01/31/2022 Imaging   1. No substantial interval change in exam. 2. Innumerable tiny cavitary pulmonary nodules in the lung bases. As noted previously, metastatic disease would be distinct consideration although imaging features are nonspecific and infectious/inflammatory etiology is not excluded. 3. Calcified nodal disease in the upper abdomen and pelvis shows no substantial interval change. Scattered noncalcified nodules in the mesentery are similar to prior. 4. Markedly large stool volume throughout the colon. Imaging features suggestive of clinical constipation. 5. Cholelithiasis with ill-defined appearance of the gallbladder wall. Appearance is stable in the interval. 6. Trace free fluid in the cul-de-sac. 7. Midline ventral hernia contains a short segment of transverse colon without complicating features. 8. Aortic Atherosclerosis (ICD10-I70.0).   03/07/2022 Surgery   Procedure Date:  03/07/2022  Pre-operative Diagnosis:  Symptomatic cholelithiasis   Post-operative Diagnosis: Symptomatic cholelithiasis, peritoneal implants with history of right fallopian tube cancer.   Procedure:   Robotic assisted cholecystectomy with ICG FireFly cholangiogram Robotic assisted abdominal wall peritoneal implant excisional biopsy x 3.   Surgeon:  Melvyn Neth, MD  Specimens:   Gallbladder Peritoneal implants x 3    Indications for Procedure:  This is a 66 y.o. female who presents with abdominal pain and workup revealing symptomatic cholelithiasis.  The benefits, complications, treatment options, and expected outcomes were discussed with the patient. The risks of bleeding, infection, recurrence of symptoms, failure to resolve symptoms, bile duct damage, bile duct leak, retained common bile duct stone, bowel injury, and need for further procedures were all discussed with the patient  and she was willing to proceed.     03/07/2022 Pathology Results   SURGICAL PATHOLOGY  CASE: [id number]  PATIENT: Holly [name]  Surgical Pathology Report   Specimen Submitted:  A. Gallbladder  B. Abdominal wall   Clinical History: Symptomatic cholelithiasis   DIAGNOSIS:  A. GALLBLADDER; CHOLECYSTECTOMY:  - CHRONIC CHOLECYSTITIS WITH CHOLELITHIASIS.  - NEGATIVE FOR DYSPLASIA AND MALIGNANCY.  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   B. SOFT TISSUE, ABDOMINAL WALL; BIOPSY:  - POSITIVE FOR MALIGNANCY.  - METASTATIC CARCINOMA, COMPATIBLE WITH HIGH-GRADE SEROUS CARCINOMA OF GYNECOLOGIC PRIMARY.   Comment:  Immunohistochemical studies were performed on neoplastic cells in both sources. Tumor cells in both are positive for CK7, Pax-8, and WT-1, supporting the above diagnosis. P53 demonstrates strong and diffuse expression, indicative of mutated expression pattern.   There is sufficient tissue present for ancillary molecular testing if desired.    04/04/2022 Imaging   1. Mild progression of partially calcified peritoneal metastatic implants surrounding the liver, in the porta hepatis and in the periumbilical region. No generalized ascites or peritoneal nodularity status post omentectomy. 2.  Grossly stable cavitary lesions at both lung bases, presumably reflecting metastatic disease. 3. No other evidence of progressive metastatic disease within the abdomen or pelvis. 4. Enlarging ventral hernia containing a portion of the transverse colon. No evidence of bowel obstruction or inflammation. 5.  Aortic Atherosclerosis (ICD10-I70.0).   04/08/2022 -  Chemotherapy   Patient is on Treatment Plan : OVARIAN Carboplatin + Paclitaxel + Bevacizumab q21d        PHYSICAL EXAMINATION: ECOG PERFORMANCE STATUS: 1 - Symptomatic but completely ambulatory  Vitals:   04/04/22 1325  BP: (!) 91/58  Pulse: 73  Resp: 18   SpO2: 100%   Filed Weights   04/04/22 1325  Weight: 154 lb 6.4 oz (70 kg)    GENERAL:alert, no distress and comfortable NEURO: alert & oriented x 3 with fluent speech, no focal motor/sensory deficits  LABORATORY DATA:  I have reviewed the data as listed    Component Value Date/Time   NA 138 03/06/2022 1158   NA 139 04/14/2017 0742   K 4.3 03/06/2022 1158   K 4.0 04/14/2017 0742   CL 105 03/06/2022 1158   CO2 23 03/06/2022 1158   CO2 21 (L) 04/14/2017 0742   GLUCOSE 121 (H) 03/06/2022 1158   GLUCOSE 247 (H) 04/14/2017 0742   BUN 24 (H) 03/06/2022 1158   BUN 13.1 04/14/2017 0742   CREATININE 1.35 (H) 03/06/2022 1158   CREATININE 0.99 03/12/2021 0832   CREATININE 0.9 04/14/2017 0742   CALCIUM 9.0 03/06/2022 1158   CALCIUM 9.2 04/14/2017 0742   PROT 6.0 (L) 01/29/2022 1411   PROT 7.3 04/14/2017 0742   ALBUMIN 3.5 01/29/2022 1411   ALBUMIN 3.9 04/14/2017 0742   AST 13 (L) 01/29/2022 1411   AST 18 03/12/2021 0832   AST 17 04/14/2017 0742   ALT 6 01/29/2022 1411   ALT 9 03/12/2021 0832   ALT 14 04/14/2017 0742   ALKPHOS 50 01/29/2022 1411   ALKPHOS 70 04/14/2017 0742   BILITOT 0.4 01/29/2022 1411   BILITOT 0.8 03/12/2021 0832   BILITOT 0.37 04/14/2017 0742   GFRNONAA 43 (L) 03/06/2022 1158   GFRNONAA >60 03/12/2021 0832   GFRAA >60 01/24/2020 0924   GFRAA >60 07/19/2019 0951    No results found for: "SPEP", "UPEP"  Lab Results  Component Value Date   WBC 5.9 01/29/2022   NEUTROABS 3.8 01/29/2022   HGB 11.3 (L) 01/29/2022   HCT 34.0 (L) 01/29/2022   MCV 93.2 01/29/2022   PLT 160 01/29/2022      Chemistry      Component Value Date/Time   NA 138 03/06/2022 1158   NA 139 04/14/2017 0742   K 4.3 03/06/2022 1158   K 4.0 04/14/2017 0742   CL 105 03/06/2022 1158   CO2 23 03/06/2022 1158   CO2 21 (L) 04/14/2017 0742   BUN 24 (H) 03/06/2022 1158   BUN 13.1 04/14/2017 0742   CREATININE 1.35 (H) 03/06/2022 1158   CREATININE 0.99 03/12/2021 0832   CREATININE  0.9 04/14/2017 0742      Component Value Date/Time   CALCIUM 9.0 03/06/2022 1158   CALCIUM 9.2 04/14/2017 0742   ALKPHOS 50 01/29/2022 1411   ALKPHOS 70 04/14/2017 0742   AST 13 (L) 01/29/2022 1411   AST 18 03/12/2021 0832   AST 17 04/14/2017 0742   ALT 6 01/29/2022 1411   ALT 9 03/12/2021 0832   ALT 14 04/14/2017 0742   BILITOT 0.4 01/29/2022 1411   BILITOT 0.8 03/12/2021 4008  BILITOT 0.37 04/14/2017 0742       RADIOGRAPHIC STUDIES: I have reviewed multiple imaging studies with the patient I have personally reviewed the radiological images as listed and agreed with the findings in the report. CT ABDOMEN PELVIS W CONTRAST  Result Date: 04/04/2022 CLINICAL DATA:  Ovarian cancer staging. Rising CA 125 levels. * Tracking Code: BO * EXAM: CT ABDOMEN AND PELVIS WITH CONTRAST TECHNIQUE: Multidetector CT imaging of the abdomen and pelvis was performed using the standard protocol following bolus administration of intravenous contrast. RADIATION DOSE REDUCTION: This exam was performed according to the departmental dose-optimization program which includes automated exposure control, adjustment of the mA and/or kV according to patient size and/or use of iterative reconstruction technique. CONTRAST:  32m OMNIPAQUE IOHEXOL 300 MG/ML  SOLN COMPARISON:  Prior CTs, most recently performed 01/30/2022 and 09/26/2021. FINDINGS: Lower chest: Again demonstrated are innumerable cavitary lesions at both lung bases, measuring up to 7 mm in the right lower lobe on images 6 and 11 of series 4. These are similar to the previous examination. There is a non cavitary lesion along the right hemidiaphragm measuring 10 mm on image 9/4 which may be slightly larger. No significant pleural or pericardial effusion. Hepatobiliary: No intrinsic hepatic metastases are identified. There are scattered subcapsular implants which have mildly progressed, largest anteriorly measuring 1.8 x 1.1 cm on image 11/2. Some of these implants  are partially calcified. The gallbladder is contracted. Previously demonstrated small gallstones are not well visualized. No significant biliary dilatation. Pancreas: Unremarkable. No pancreatic ductal dilatation or surrounding inflammatory changes. Spleen: Normal in size without focal abnormality. Adrenals/Urinary Tract: Both adrenal glands appear normal. No evidence of urinary tract calculus, suspicious renal lesion or hydronephrosis. Unchanged extrarenal pelvis on the right. The bladder appears unremarkable for its degree of distention. Stomach/Bowel: Enteric contrast was administered and has passed into the mid colon. The stomach appears unremarkable for its degree of distension. No evidence of bowel wall thickening, distention or surrounding inflammatory change. The appendix appears normal. Ventral hernia has enlarged, containing a portion of the transverse colon. There is moderate stool throughout the colon. Vascular/Lymphatic: Minimally progressive partially calcified adenopathy in the porta hepatis, including a 2.0 x 1.3 cm node on image 20/2 and a 1.5 x 1.0 cm node on image 19/2. Partially calcified bilateral ankle nodes have not significantly changed. Aortic and branch vessel atherosclerosis without evidence of aneurysm or large vessel occlusion. Reproductive: Status post hysterectomy. The vaginal cuff appears unchanged. No adnexal lesions. Other: As above, slow growth of partially calcified peritoneal implants surrounding the liver. There is a partially calcified periumbilical implant measuring 1.4 cm on image 40/2 which appears slowly growing. No generalized ascites or peritoneal nodularity status post omentectomy. Musculoskeletal: No acute or significant osseous findings. Stable mild lumbar spondylosis. IMPRESSION: 1. Mild progression of partially calcified peritoneal metastatic implants surrounding the liver, in the porta hepatis and in the periumbilical region. No generalized ascites or peritoneal  nodularity status post omentectomy. 2. Grossly stable cavitary lesions at both lung bases, presumably reflecting metastatic disease. 3. No other evidence of progressive metastatic disease within the abdomen or pelvis. 4. Enlarging ventral hernia containing a portion of the transverse colon. No evidence of bowel obstruction or inflammation. 5.  Aortic Atherosclerosis (ICD10-I70.0). Electronically Signed   By: WRichardean SaleM.D.   On: 04/04/2022 10:58

## 2022-04-05 ENCOUNTER — Ambulatory Visit: Payer: Medicare Other | Admitting: Hematology and Oncology

## 2022-04-05 ENCOUNTER — Other Ambulatory Visit: Payer: Medicare Other

## 2022-04-05 MED FILL — Dexamethasone Sodium Phosphate Inj 100 MG/10ML: INTRAMUSCULAR | Qty: 1 | Status: AC

## 2022-04-05 MED FILL — Fosaprepitant Dimeglumine For IV Infusion 150 MG (Base Eq): INTRAVENOUS | Qty: 5 | Status: AC

## 2022-04-08 ENCOUNTER — Encounter: Payer: Self-pay | Admitting: Hematology and Oncology

## 2022-04-08 ENCOUNTER — Inpatient Hospital Stay: Payer: Medicare Other

## 2022-04-08 ENCOUNTER — Other Ambulatory Visit: Payer: Self-pay

## 2022-04-08 VITALS — BP 173/82 | HR 62 | Temp 97.9°F | Resp 16

## 2022-04-08 DIAGNOSIS — Z7189 Other specified counseling: Secondary | ICD-10-CM

## 2022-04-08 DIAGNOSIS — C5701 Malignant neoplasm of right fallopian tube: Secondary | ICD-10-CM

## 2022-04-08 DIAGNOSIS — Z5111 Encounter for antineoplastic chemotherapy: Secondary | ICD-10-CM | POA: Diagnosis not present

## 2022-04-08 LAB — CBC WITH DIFFERENTIAL (CANCER CENTER ONLY)
Abs Immature Granulocytes: 0.08 10*3/uL — ABNORMAL HIGH (ref 0.00–0.07)
Basophils Absolute: 0.1 10*3/uL (ref 0.0–0.1)
Basophils Relative: 1 %
Eosinophils Absolute: 1.1 10*3/uL — ABNORMAL HIGH (ref 0.0–0.5)
Eosinophils Relative: 10 %
HCT: 37.9 % (ref 36.0–46.0)
Hemoglobin: 12.4 g/dL (ref 12.0–15.0)
Immature Granulocytes: 1 %
Lymphocytes Relative: 13 %
Lymphs Abs: 1.4 10*3/uL (ref 0.7–4.0)
MCH: 31.8 pg (ref 26.0–34.0)
MCHC: 32.7 g/dL (ref 30.0–36.0)
MCV: 97.2 fL (ref 80.0–100.0)
Monocytes Absolute: 0.8 10*3/uL (ref 0.1–1.0)
Monocytes Relative: 8 %
Neutro Abs: 7.2 10*3/uL (ref 1.7–7.7)
Neutrophils Relative %: 67 %
Platelet Count: 190 10*3/uL (ref 150–400)
RBC: 3.9 MIL/uL (ref 3.87–5.11)
RDW: 14.6 % (ref 11.5–15.5)
WBC Count: 10.7 10*3/uL — ABNORMAL HIGH (ref 4.0–10.5)
nRBC: 0 % (ref 0.0–0.2)

## 2022-04-08 LAB — CMP (CANCER CENTER ONLY)
ALT: 8 U/L (ref 0–44)
AST: 14 U/L — ABNORMAL LOW (ref 15–41)
Albumin: 3.5 g/dL (ref 3.5–5.0)
Alkaline Phosphatase: 46 U/L (ref 38–126)
Anion gap: 8 (ref 5–15)
BUN: 26 mg/dL — ABNORMAL HIGH (ref 8–23)
CO2: 24 mmol/L (ref 22–32)
Calcium: 9 mg/dL (ref 8.9–10.3)
Chloride: 104 mmol/L (ref 98–111)
Creatinine: 1.56 mg/dL — ABNORMAL HIGH (ref 0.44–1.00)
GFR, Estimated: 36 mL/min — ABNORMAL LOW (ref >60)
Glucose, Bld: 203 mg/dL — ABNORMAL HIGH (ref 70–99)
Potassium: 4.5 mmol/L (ref 3.5–5.1)
Sodium: 136 mmol/L (ref 135–145)
Total Bilirubin: 0.4 mg/dL (ref 0.3–1.2)
Total Protein: 5.7 g/dL — ABNORMAL LOW (ref 6.5–8.1)

## 2022-04-08 MED ORDER — SODIUM CHLORIDE 0.9% FLUSH
10.0000 mL | Freq: Once | INTRAVENOUS | Status: AC
  Administered 2022-04-08: 10 mL

## 2022-04-08 MED ORDER — FAMOTIDINE IN NACL 20-0.9 MG/50ML-% IV SOLN
20.0000 mg | Freq: Once | INTRAVENOUS | Status: AC
  Administered 2022-04-08: 20 mg via INTRAVENOUS
  Filled 2022-04-08: qty 50

## 2022-04-08 MED ORDER — SODIUM CHLORIDE 0.9% FLUSH
10.0000 mL | INTRAVENOUS | Status: DC | PRN
  Administered 2022-04-08: 10 mL

## 2022-04-08 MED ORDER — SODIUM CHLORIDE 0.9 % IV SOLN
339.0000 mg | Freq: Once | INTRAVENOUS | Status: AC
  Administered 2022-04-08: 340 mg via INTRAVENOUS
  Filled 2022-04-08: qty 34

## 2022-04-08 MED ORDER — PALONOSETRON HCL INJECTION 0.25 MG/5ML
0.2500 mg | Freq: Once | INTRAVENOUS | Status: AC
  Administered 2022-04-08: 0.25 mg via INTRAVENOUS
  Filled 2022-04-08: qty 5

## 2022-04-08 MED ORDER — SODIUM CHLORIDE 0.9 % IV SOLN
150.0000 mg | Freq: Once | INTRAVENOUS | Status: AC
  Administered 2022-04-08: 150 mg via INTRAVENOUS
  Filled 2022-04-08: qty 150

## 2022-04-08 MED ORDER — SODIUM CHLORIDE 0.9 % IV SOLN
10.0000 mg | Freq: Once | INTRAVENOUS | Status: AC
  Administered 2022-04-08: 10 mg via INTRAVENOUS
  Filled 2022-04-08: qty 10

## 2022-04-08 MED ORDER — CETIRIZINE HCL 10 MG/ML IV SOLN
10.0000 mg | Freq: Once | INTRAVENOUS | Status: AC
  Administered 2022-04-08: 10 mg via INTRAVENOUS
  Filled 2022-04-08: qty 1

## 2022-04-08 MED ORDER — SODIUM CHLORIDE 0.9 % IV SOLN
175.0000 mg/m2 | Freq: Once | INTRAVENOUS | Status: AC
  Administered 2022-04-08: 312 mg via INTRAVENOUS
  Filled 2022-04-08: qty 52

## 2022-04-08 MED ORDER — SODIUM CHLORIDE 0.9 % IV SOLN: Freq: Once | INTRAVENOUS | Status: AC

## 2022-04-08 MED ORDER — HEPARIN SOD (PORK) LOCK FLUSH 100 UNIT/ML IV SOLN
500.0000 [IU] | Freq: Once | INTRAVENOUS | Status: AC | PRN
  Administered 2022-04-08: 500 [IU]

## 2022-04-08 NOTE — Patient Instructions (Signed)
[facility]  Discharge Instructions: Thank you for choosing [facility] to provide your oncology and hematology care.   If you have a lab appointment with the [facility], please go directly to the [facility] and check in at the registration area.   Wear comfortable clothing and clothing appropriate for easy access to any Portacath or PICC line.   We strive to give you quality time with your provider. You may need to reschedule your appointment if you arrive late (15 or more minutes).  Arriving late affects you and other patients whose appointments are after yours.  Also, if you miss three or more appointments without notifying the office, you may be dismissed from the clinic at the provider's discretion.      For prescription refill requests, have your pharmacy contact our office and allow 72 hours for refills to be completed.    Today you received the following chemotherapy and/or immunotherapy agents Taxol, Carbo, Bevacizumab      To help prevent nausea and vomiting after your treatment, we encourage you to take your nausea medication as directed.  BELOW ARE SYMPTOMS THAT SHOULD BE REPORTED IMMEDIATELY: *FEVER GREATER THAN 100.4 F (38 C) OR HIGHER *CHILLS OR SWEATING *NAUSEA AND VOMITING THAT IS NOT CONTROLLED WITH YOUR NAUSEA MEDICATION *UNUSUAL SHORTNESS OF BREATH *UNUSUAL BRUISING OR BLEEDING *URINARY PROBLEMS (pain or burning when urinating, or frequent urination) *BOWEL PROBLEMS (unusual diarrhea, constipation, pain near the anus) TENDERNESS IN MOUTH AND THROAT WITH OR WITHOUT PRESENCE OF ULCERS (sore throat, sores in mouth, or a toothache) UNUSUAL RASH, SWELLING OR PAIN  UNUSUAL VAGINAL DISCHARGE OR ITCHING   Items with * indicate a potential emergency and should be followed up as soon as possible or go to the Emergency Department if any problems should occur.  Please show the CHEMOTHERAPY ALERT CARD or IMMUNOTHERAPY ALERT CARD  at check-in to the Emergency Department and triage nurse.  Should you have questions after your visit or need to cancel or reschedule your appointment, please contact [facility]  Dept: [phone]  and follow the prompts.  Office hours are 8:00 a.m. to 4:30 p.m. Monday - Friday. Please note that voicemails left after 4:00 p.m. may not be returned until the following business day.  We are closed weekends and major holidays. You have access to a nurse at all times for urgent questions. Please call the main number to the clinic Dept: [phone] and follow the prompts.   For any non-urgent questions, you may also contact your provider using MyChart. We now offer e-Visits for anyone 76 and older to request care online for non-urgent symptoms. For details visit [email or web address].   Also download the MyChart app! Go to the app store, search "MyChart", open the app, select [facility], and log in with your MyChart username and password.  Masks are optional in the cancer centers. If you would like for your care team to wear a mask while they are taking care of you, please let them know. You may have one support person who is at least 67 years old accompany you for your appointments. Paclitaxel Injection What is this medication? PACLITAXEL (PAK li TAX el) treats some types of cancer. It works by slowing down the growth of cancer cells. This medicine may be used for other purposes; ask your health care provider or pharmacist if you have questions. COMMON BRAND NAME(S): Onxol, Taxol What should I tell my care team before I take  this medication? They need to know if you have any of these conditions: Heart disease Liver disease Low white blood cell levels An unusual or allergic reaction to paclitaxel, other medications, foods, dyes, or preservatives If you or your partner are pregnant or trying to get pregnant Breast-feeding How should I use this medication? This  medication is injected into a vein. It is given by your care team in a hospital or clinic setting. Talk to your care team about the use of this medication in children. While it may be given to children for selected conditions, precautions do apply. Overdosage: If you think you have taken too much of this medicine contact a poison control center or emergency room at once. NOTE: This medicine is only for you. Do not share this medicine with others. What if I miss a dose? Keep appointments for follow-up doses. It is important not to miss your dose. Call your care team if you are unable to keep an appointment. What may interact with this medication? Do not take this medication with any of the following: Live virus vaccines Other medications may affect the way this medication works. Talk with your care team about all of the medications you take. They may suggest changes to your treatment plan to lower the risk of side effects and to make sure your medications work as intended. This list may not describe all possible interactions. Give your health care provider a list of all the medicines, herbs, non-prescription drugs, or dietary supplements you use. Also tell them if you smoke, drink alcohol, or use illegal drugs. Some items may interact with your medicine. What should I watch for while using this medication? Your condition will be monitored carefully while you are receiving this medication. You may need blood work while taking this medication. This medication may make you feel generally unwell. This is not uncommon as chemotherapy can affect healthy cells as well as cancer cells. Report any side effects. Continue your course of treatment even though you feel ill unless your care team tells you to stop. This medication can cause serious allergic reactions. To reduce the risk, your care team may give you other medications to take before receiving this one. Be sure to follow the directions from your care  team. This medication may increase your risk of getting an infection. Call your care team for advice if you get a fever, chills, sore throat, or other symptoms of a cold or flu. Do not treat yourself. Try to avoid being around people who are sick. This medication may increase your risk to bruise or bleed. Call your care team if you notice any unusual bleeding. Be careful brushing or flossing your teeth or using a toothpick because you may get an infection or bleed more easily. If you have any dental work done, tell your dentist you are receiving this medication. Talk to your care team if you may be pregnant. Serious birth defects can occur if you take this medication during pregnancy. Talk to your care team before breastfeeding. Changes to your treatment plan may be needed. What side effects may I notice from receiving this medication? Side effects that you should report to your care team as soon as possible: Allergic reactions--skin rash, itching, hives, swelling of the face, lips, tongue, or throat Heart rhythm changes--fast or irregular heartbeat, dizziness, feeling faint or lightheaded, chest pain, trouble breathing Increase in blood pressure Infection--fever, chills, cough, sore throat, wounds that don't heal, pain or trouble when passing urine, general feeling   of discomfort or being unwell Low blood pressure--dizziness, feeling faint or lightheaded, blurry vision Low red blood cell level--unusual weakness or fatigue, dizziness, headache, trouble breathing Painful swelling, warmth, or redness of the skin, blisters or sores at the infusion site Pain, tingling, or numbness in the hands or feet Slow heartbeat--dizziness, feeling faint or lightheaded, confusion, trouble breathing, unusual weakness or fatigue Unusual bruising or bleeding Side effects that usually do not require medical attention (report to your care team if they continue or are bothersome): Diarrhea Hair loss Joint pain Loss of  appetite Muscle pain Nausea Vomiting This list may not describe all possible side effects. Call your doctor for medical advice about side effects. You may report side effects to FDA at 1-800-FDA-1088. Where should I keep my medication? This medication is given in a hospital or clinic. It will not be stored at home. NOTE: This sheet is a summary. It may not cover all possible information. If you have questions about this medicine, talk to your doctor, pharmacist, or health care provider.  2023 Elsevier/Gold Standard (2021-08-08 00:00:00) Carboplatin Injection What is this medication? CARBOPLATIN (KAR boe pla tin) treats some types of cancer. It works by slowing down the growth of cancer cells. This medicine may be used for other purposes; ask your health care provider or pharmacist if you have questions. COMMON BRAND NAME(S): Paraplatin What should I tell my care team before I take this medication? They need to know if you have any of these conditions: Blood disorders Hearing problems Kidney disease Recent or ongoing radiation therapy An unusual or allergic reaction to carboplatin, cisplatin, other medications, foods, dyes, or preservatives Pregnant or trying to get pregnant Breast-feeding How should I use this medication? This medication is injected into a vein. It is given by your care team in a hospital or clinic setting. Talk to your care team about the use of this medication in children. Special care may be needed. Overdosage: If you think you have taken too much of this medicine contact a poison control center or emergency room at once. NOTE: This medicine is only for you. Do not share this medicine with others. What if I miss a dose? Keep appointments for follow-up doses. It is important not to miss your dose. Call your care team if you are unable to keep an appointment. What may interact with this medication? Medications for seizures Some antibiotics, such as amikacin,  gentamicin, neomycin, streptomycin, tobramycin Vaccines This list may not describe all possible interactions. Give your health care provider a list of all the medicines, herbs, non-prescription drugs, or dietary supplements you use. Also tell them if you smoke, drink alcohol, or use illegal drugs. Some items may interact with your medicine. What should I watch for while using this medication? Your condition will be monitored carefully while you are receiving this medication. You may need blood work while taking this medication. This medication may make you feel generally unwell. This is not uncommon, as chemotherapy can affect healthy cells as well as cancer cells. Report any side effects. Continue your course of treatment even though you feel ill unless your care team tells you to stop. In some cases, you may be given additional medications to help with side effects. Follow all directions for their use. This medication may increase your risk of getting an infection. Call your care team for advice if you get a fever, chills, sore throat, or other symptoms of a cold or flu. Do not treat yourself. Try to avoid being   around people who are sick. Avoid taking medications that contain aspirin, acetaminophen, ibuprofen, naproxen, or ketoprofen unless instructed by your care team. These medications may hide a fever. Be careful brushing or flossing your teeth or using a toothpick because you may get an infection or bleed more easily. If you have any dental work done, tell your dentist you are receiving this medication. Talk to your care team if you wish to become pregnant or think you might be pregnant. This medication can cause serious birth defects. Talk to your care team about effective forms of contraception. Do not breast-feed while taking this medication. What side effects may I notice from receiving this medication? Side effects that you should report to your care team as soon as possible: Allergic  reactions--skin rash, itching, hives, swelling of the face, lips, tongue, or throat Infection--fever, chills, cough, sore throat, wounds that don't heal, pain or trouble when passing urine, general feeling of discomfort or being unwell Low red blood cell level--unusual weakness or fatigue, dizziness, headache, trouble breathing Pain, tingling, or numbness in the hands or feet, muscle weakness, change in vision, confusion or trouble speaking, loss of balance or coordination, trouble walking, seizures Unusual bruising or bleeding Side effects that usually do not require medical attention (report to your care team if they continue or are bothersome): Hair loss Nausea Unusual weakness or fatigue Vomiting This list may not describe all possible side effects. Call your doctor for medical advice about side effects. You may report side effects to FDA at 1-800-FDA-1088. Where should I keep my medication? This medication is given in a hospital or clinic. It will not be stored at home. NOTE: This sheet is a summary. It may not cover all possible information. If you have questions about this medicine, talk to your doctor, pharmacist, or health care provider.  2023 Elsevier/Gold Standard (2021-07-23 00:00:00) Bevacizumab Injection What is this medication? BEVACIZUMAB (be va SIZ yoo mab) treats some types of cancer. It works by blocking a protein that causes cancer cells to grow and multiply. This helps to slow or stop the spread of cancer cells. It is a monoclonal antibody. This medicine may be used for other purposes; ask your health care provider or pharmacist if you have questions. COMMON BRAND NAME(S): Alymsys, Avastin, MVASI, Noah Charon What should I tell my care team before I take this medication? They need to know if you have any of these conditions: Blood clots Coughing up blood Having or recent surgery Heart failure High blood pressure History of a connection between 2 or more body parts that do  not usually connect (fistula) History of a tear in your stomach or intestines Protein in your urine An unusual or allergic reaction to bevacizumab, other medications, foods, dyes, or preservatives Pregnant or trying to get pregnant Breast-feeding How should I use this medication? This medication is injected into a vein. It is given by your care team in a hospital or clinic setting. Talk to your care team the use of this medication in children. Special care may be needed. Overdosage: If you think you have taken too much of this medicine contact a poison control center or emergency room at once. NOTE: This medicine is only for you. Do not share this medicine with others. What if I miss a dose? Keep appointments for follow-up doses. It is important not to miss your dose. Call your care team if you are unable to keep an appointment. What may interact with this medication? Interactions are not expected. This  list may not describe all possible interactions. Give your health care provider a list of all the medicines, herbs, non-prescription drugs, or dietary supplements you use. Also tell them if you smoke, drink alcohol, or use illegal drugs. Some items may interact with your medicine. What should I watch for while using this medication? Your condition will be monitored carefully while you are receiving this medication. You may need blood work while taking this medication. This medication may make you feel generally unwell. This is not uncommon as chemotherapy can affect healthy cells as well as cancer cells. Report any side effects. Continue your course of treatment even though you feel ill unless your care team tells you to stop. This medication may increase your risk to bruise or bleed. Call your care team if you notice any unusual bleeding. Before having surgery, talk to your care team to make sure it is ok. This medication can increase the risk of poor healing of your surgical site or wound. You  will need to stop this medication for 28 days before surgery. After surgery, wait at least 28 days before restarting this medication. Make sure the surgical site or wound is healed enough before restarting this medication. Talk to your care team if questions. Talk to your care team if you may be pregnant. Serious birth defects can occur if you take this medication during pregnancy and for 6 months after the last dose. Contraception is recommended while taking this medication and for 6 months after the last dose. Your care team can help you find the option that works for you. Do not breastfeed while taking this medication and for 6 months after the last dose. This medication can cause infertility. Talk to your care team if you are concerned about your fertility. What side effects may I notice from receiving this medication? Side effects that you should report to your care team as soon as possible: Allergic reactions--skin rash, itching, hives, swelling of the face, lips, tongue, or throat Bleeding--bloody or black, tar-like stools, vomiting blood or brown material that looks like coffee grounds, red or dark brown urine, small red or purple spots on skin, unusual bruising or bleeding Blood clot--pain, swelling, or warmth in the leg, shortness of breath, chest pain Heart attack--pain or tightness in the chest, shoulders, arms, or jaw, nausea, shortness of breath, cold or clammy skin, feeling faint or lightheaded Heart failure--shortness of breath, swelling of the ankles, feet, or hands, sudden weight gain, unusual weakness or fatigue Increase in blood pressure Infection--fever, chills, cough, sore throat, wounds that don't heal, pain or trouble when passing urine, general feeling of discomfort or being unwell Infusion reactions--chest pain, shortness of breath or trouble breathing, feeling faint or lightheaded Kidney injury--decrease in the amount of urine, swelling of the ankles, hands, or feet Stomach  pain that is severe, does not go away, or gets worse Stroke--sudden numbness or weakness of the face, arm, or leg, trouble speaking, confusion, trouble walking, loss of balance or coordination, dizziness, severe headache, change in vision Sudden and severe headache, confusion, change in vision, seizures, which may be signs of posterior reversible encephalopathy syndrome (PRES) Side effects that usually do not require medical attention (report to your care team if they continue or are bothersome): Back pain Change in taste Diarrhea Dry skin Increased tears Nosebleed This list may not describe all possible side effects. Call your doctor for medical advice about side effects. You may report side effects to FDA at 1-800-FDA-1088. Where should I keep my  medication? This medication is given in a hospital or clinic. It will not be stored at home. NOTE: This sheet is a summary. It may not cover all possible information. If you have questions about this medicine, talk to your doctor, pharmacist, or health care provider.  2023 Elsevier/Gold Standard (2021-08-10 00:00:00)

## 2022-04-08 NOTE — Progress Notes (Signed)
Ok to treat today per Dr. Alvy Bimler with a creatinine of 1.56. Advised patient to increase oral fluid intake.

## 2022-04-09 ENCOUNTER — Telehealth: Payer: Self-pay

## 2022-04-09 ENCOUNTER — Encounter: Payer: Self-pay | Admitting: Hematology and Oncology

## 2022-04-09 LAB — CA 125: Cancer Antigen (CA) 125: 86.7 U/mL — ABNORMAL HIGH (ref 0.0–38.1)

## 2022-04-09 NOTE — Telephone Encounter (Signed)
She called regarding facial redness today after treatment yesterday. Denies itching. She sent a mychart message with pictures of her face. Told her the redness is from the steroids and it will fade in a few days. Instructed to call the office back for questions/concerns. She verbalized understanding.  FYI

## 2022-04-10 ENCOUNTER — Other Ambulatory Visit: Payer: Self-pay | Admitting: Hematology and Oncology

## 2022-04-12 ENCOUNTER — Institutional Professional Consult (permissible substitution): Payer: Medicare Other | Admitting: Pulmonary Disease

## 2022-04-14 ENCOUNTER — Other Ambulatory Visit: Payer: Self-pay

## 2022-04-15 ENCOUNTER — Encounter: Payer: Self-pay | Admitting: Hematology and Oncology

## 2022-04-16 ENCOUNTER — Other Ambulatory Visit: Payer: Self-pay | Admitting: *Deleted

## 2022-04-16 ENCOUNTER — Telehealth: Payer: Self-pay | Admitting: *Deleted

## 2022-04-16 ENCOUNTER — Encounter: Payer: Self-pay | Admitting: Nutrition

## 2022-04-16 DIAGNOSIS — C5701 Malignant neoplasm of right fallopian tube: Secondary | ICD-10-CM

## 2022-04-16 NOTE — Telephone Encounter (Signed)
VM left to f/u with pt for nutrition. Pt states that she is having issues with keeping food down. Advised that referral will be placed someone will call with an appt set up. Pt verbalized understanding.

## 2022-04-16 NOTE — Progress Notes (Signed)
Received message from RN, Donnie Mesa, that patient requesting nutrition referral.  Patient is coming in for IV fluids on Thursday however there are no RD appointments available on that day.  I have scheduled patient during her next infusion on January 8.  If patient would like an appointment sooner this will need to be referred back to scheduling to find an earlier appointment if available.

## 2022-04-16 NOTE — Progress Notes (Signed)
Pt called with c/o dehydration and not being able to eat. Pt has been scheduled for 12/28 @ 2pm. She has been called and notified and verbalized understanding. Referral has been made to nutrition for appt.

## 2022-04-18 ENCOUNTER — Inpatient Hospital Stay: Payer: Medicare Other

## 2022-04-18 VITALS — BP 155/87 | HR 87 | Temp 97.9°F | Resp 20 | Wt 150.8 lb

## 2022-04-18 DIAGNOSIS — Z5111 Encounter for antineoplastic chemotherapy: Secondary | ICD-10-CM | POA: Diagnosis not present

## 2022-04-18 DIAGNOSIS — Z7189 Other specified counseling: Secondary | ICD-10-CM

## 2022-04-18 DIAGNOSIS — C5701 Malignant neoplasm of right fallopian tube: Secondary | ICD-10-CM

## 2022-04-18 MED ORDER — SODIUM CHLORIDE 0.9 % IV SOLN: Freq: Once | INTRAVENOUS | Status: AC

## 2022-04-18 MED ORDER — SODIUM CHLORIDE 0.9% FLUSH
10.0000 mL | Freq: Once | INTRAVENOUS | Status: AC
  Administered 2022-04-18: 10 mL

## 2022-04-18 MED ORDER — HEPARIN SOD (PORK) LOCK FLUSH 100 UNIT/ML IV SOLN
500.0000 [IU] | Freq: Once | INTRAVENOUS | Status: AC
  Administered 2022-04-18: 500 [IU]

## 2022-04-18 NOTE — Patient Instructions (Signed)

## 2022-04-20 ENCOUNTER — Other Ambulatory Visit: Payer: Self-pay | Admitting: Hematology and Oncology

## 2022-04-21 NOTE — Telephone Encounter (Signed)
Per last OV, continue. Mozes Sagar M Kazuto Sevey, RN  

## 2022-04-26 ENCOUNTER — Encounter: Payer: Self-pay | Admitting: Surgery

## 2022-04-26 MED FILL — Dexamethasone Sodium Phosphate Inj 100 MG/10ML: INTRAMUSCULAR | Qty: 1 | Status: AC

## 2022-04-26 MED FILL — Fosaprepitant Dimeglumine For IV Infusion 150 MG (Base Eq): INTRAVENOUS | Qty: 5 | Status: AC

## 2022-04-29 ENCOUNTER — Other Ambulatory Visit: Payer: Self-pay

## 2022-04-29 ENCOUNTER — Encounter: Payer: Self-pay | Admitting: Hematology and Oncology

## 2022-04-29 ENCOUNTER — Inpatient Hospital Stay: Payer: Medicare Other

## 2022-04-29 ENCOUNTER — Inpatient Hospital Stay (HOSPITAL_BASED_OUTPATIENT_CLINIC_OR_DEPARTMENT_OTHER): Payer: Medicare Other | Admitting: Hematology and Oncology

## 2022-04-29 ENCOUNTER — Inpatient Hospital Stay: Payer: Medicare Other | Attending: Hematology and Oncology

## 2022-04-29 VITALS — BP 180/95 | HR 82 | Temp 97.8°F | Resp 16 | Wt 152.5 lb

## 2022-04-29 DIAGNOSIS — Z5111 Encounter for antineoplastic chemotherapy: Secondary | ICD-10-CM | POA: Insufficient documentation

## 2022-04-29 DIAGNOSIS — C786 Secondary malignant neoplasm of retroperitoneum and peritoneum: Secondary | ICD-10-CM | POA: Diagnosis not present

## 2022-04-29 DIAGNOSIS — Z79899 Other long term (current) drug therapy: Secondary | ICD-10-CM | POA: Diagnosis not present

## 2022-04-29 DIAGNOSIS — G620 Drug-induced polyneuropathy: Secondary | ICD-10-CM | POA: Diagnosis not present

## 2022-04-29 DIAGNOSIS — Z7189 Other specified counseling: Secondary | ICD-10-CM

## 2022-04-29 DIAGNOSIS — D6481 Anemia due to antineoplastic chemotherapy: Secondary | ICD-10-CM | POA: Diagnosis not present

## 2022-04-29 DIAGNOSIS — C5701 Malignant neoplasm of right fallopian tube: Secondary | ICD-10-CM

## 2022-04-29 DIAGNOSIS — Z5112 Encounter for antineoplastic immunotherapy: Secondary | ICD-10-CM | POA: Diagnosis not present

## 2022-04-29 DIAGNOSIS — T451X5A Adverse effect of antineoplastic and immunosuppressive drugs, initial encounter: Secondary | ICD-10-CM | POA: Diagnosis not present

## 2022-04-29 LAB — CMP (CANCER CENTER ONLY)
ALT: 8 U/L (ref 0–44)
AST: 15 U/L (ref 15–41)
Albumin: 3.5 g/dL (ref 3.5–5.0)
Alkaline Phosphatase: 63 U/L (ref 38–126)
Anion gap: 7 (ref 5–15)
BUN: 14 mg/dL (ref 8–23)
CO2: 25 mmol/L (ref 22–32)
Calcium: 9.1 mg/dL (ref 8.9–10.3)
Chloride: 103 mmol/L (ref 98–111)
Creatinine: 1.14 mg/dL — ABNORMAL HIGH (ref 0.44–1.00)
GFR, Estimated: 53 mL/min — ABNORMAL LOW (ref >60)
Glucose, Bld: 226 mg/dL — ABNORMAL HIGH (ref 70–99)
Potassium: 4.5 mmol/L (ref 3.5–5.1)
Sodium: 135 mmol/L (ref 135–145)
Total Bilirubin: 0.5 mg/dL (ref 0.3–1.2)
Total Protein: 5.6 g/dL — ABNORMAL LOW (ref 6.5–8.1)

## 2022-04-29 LAB — CBC WITH DIFFERENTIAL (CANCER CENTER ONLY)
Abs Immature Granulocytes: 0.26 10*3/uL — ABNORMAL HIGH (ref 0.00–0.07)
Basophils Absolute: 0 10*3/uL (ref 0.0–0.1)
Basophils Relative: 1 %
Eosinophils Absolute: 0 10*3/uL (ref 0.0–0.5)
Eosinophils Relative: 0 %
HCT: 33.4 % — ABNORMAL LOW (ref 36.0–46.0)
Hemoglobin: 11.1 g/dL — ABNORMAL LOW (ref 12.0–15.0)
Immature Granulocytes: 3 %
Lymphocytes Relative: 8 %
Lymphs Abs: 0.7 10*3/uL (ref 0.7–4.0)
MCH: 31.6 pg (ref 26.0–34.0)
MCHC: 33.2 g/dL (ref 30.0–36.0)
MCV: 95.2 fL (ref 80.0–100.0)
Monocytes Absolute: 0.1 10*3/uL (ref 0.1–1.0)
Monocytes Relative: 1 %
Neutro Abs: 7.4 10*3/uL (ref 1.7–7.7)
Neutrophils Relative %: 87 %
Platelet Count: 186 10*3/uL (ref 150–400)
RBC: 3.51 MIL/uL — ABNORMAL LOW (ref 3.87–5.11)
RDW: 15.7 % — ABNORMAL HIGH (ref 11.5–15.5)
WBC Count: 8.5 10*3/uL (ref 4.0–10.5)
nRBC: 0 % (ref 0.0–0.2)

## 2022-04-29 LAB — TOTAL PROTEIN, URINE DIPSTICK: Protein, ur: NEGATIVE mg/dL

## 2022-04-29 MED ORDER — PALONOSETRON HCL INJECTION 0.25 MG/5ML
0.2500 mg | Freq: Once | INTRAVENOUS | Status: AC
  Administered 2022-04-29: 0.25 mg via INTRAVENOUS
  Filled 2022-04-29: qty 5

## 2022-04-29 MED ORDER — HEPARIN SOD (PORK) LOCK FLUSH 100 UNIT/ML IV SOLN
500.0000 [IU] | Freq: Once | INTRAVENOUS | Status: AC | PRN
  Administered 2022-04-29: 500 [IU]

## 2022-04-29 MED ORDER — SODIUM CHLORIDE 0.9 % IV SOLN: Freq: Once | INTRAVENOUS | Status: AC

## 2022-04-29 MED ORDER — SODIUM CHLORIDE 0.9 % IV SOLN
175.0000 mg/m2 | Freq: Once | INTRAVENOUS | Status: AC
  Administered 2022-04-29: 312 mg via INTRAVENOUS
  Filled 2022-04-29: qty 52

## 2022-04-29 MED ORDER — SODIUM CHLORIDE 0.9 % IV SOLN
339.0000 mg | Freq: Once | INTRAVENOUS | Status: AC
  Administered 2022-04-29: 350 mg via INTRAVENOUS
  Filled 2022-04-29: qty 35

## 2022-04-29 MED ORDER — CETIRIZINE HCL 10 MG/ML IV SOLN
10.0000 mg | Freq: Once | INTRAVENOUS | Status: AC
  Administered 2022-04-29: 10 mg via INTRAVENOUS
  Filled 2022-04-29: qty 1

## 2022-04-29 MED ORDER — SODIUM CHLORIDE 0.9% FLUSH
10.0000 mL | INTRAVENOUS | Status: DC | PRN
  Administered 2022-04-29: 10 mL

## 2022-04-29 MED ORDER — SODIUM CHLORIDE 0.9 % IV SOLN
10.0000 mg | Freq: Once | INTRAVENOUS | Status: AC
  Administered 2022-04-29: 10 mg via INTRAVENOUS
  Filled 2022-04-29: qty 10

## 2022-04-29 MED ORDER — FAMOTIDINE IN NACL 20-0.9 MG/50ML-% IV SOLN
20.0000 mg | Freq: Once | INTRAVENOUS | Status: AC
  Administered 2022-04-29: 20 mg via INTRAVENOUS
  Filled 2022-04-29: qty 50

## 2022-04-29 MED ORDER — SODIUM CHLORIDE 0.9 % IV SOLN
7.5000 mg/kg | Freq: Once | INTRAVENOUS | Status: AC
  Administered 2022-04-29: 500 mg via INTRAVENOUS
  Filled 2022-04-29: qty 20

## 2022-04-29 MED ORDER — SODIUM CHLORIDE 0.9 % IV SOLN
150.0000 mg | Freq: Once | INTRAVENOUS | Status: AC
  Administered 2022-04-29: 150 mg via INTRAVENOUS
  Filled 2022-04-29: qty 150

## 2022-04-29 MED ORDER — SODIUM CHLORIDE 0.9% FLUSH
10.0000 mL | Freq: Once | INTRAVENOUS | Status: AC
  Administered 2022-04-29: 10 mL

## 2022-04-29 NOTE — Assessment & Plan Note (Signed)
she has mild peripheral neuropathy, likely related to side effects of treatment. It is only mild, not bothering the patient. I will observe for now If it gets worse in the future, I will consider modifying the dose of the treatment  

## 2022-04-29 NOTE — Progress Notes (Signed)
[place] OFFICE PROGRESS NOTE  Patient Care Team: Gaynelle Arabian, MD as PCP - General (Family Medicine) Heath Lark, MD as Consulting Physician (Hematology and Oncology) Everitt Amber, MD as Consulting Physician (Obstetrics and Gynecology)  ASSESSMENT & PLAN:  Adenocarcinoma of right fallopian tube [facility]) Recent treatment was complicated by slight dehydration, stable neuropathy and constipation We discussed importance of adequate hydration, keeping track on her oral intake as well as aggressive laxative therapy  Anemia due to antineoplastic chemotherapy This is likely due to recent treatment. The patient denies recent history of bleeding such as epistaxis, hematuria or hematochezia. She is asymptomatic from the anemia. I will observe for now.  She does not require transfusion now. I will continue the chemotherapy at current dose without dosage adjustment.  If the anemia gets progressive worse in the future, I might have to delay her treatment or adjust the chemotherapy dose.   Peripheral neuropathy due to chemotherapy [facility]) she has mild peripheral neuropathy, likely related to side effects of treatment. It is only mild, not bothering the patient. I will observe for now If it gets worse in the future, I will consider modifying the dose of the treatment   No orders of the defined types were placed in this encounter.   All questions were answered. The patient knows to call the clinic with any problems, questions or concerns. The total time spent in the appointment was 20 minutes encounter with patients including review of chart and various tests results, discussions about plan of care and coordination of care plan   Heath Lark, MD 04/29/2022 8:41 AM  INTERVAL HISTORY: Please see below for problem oriented charting. she returns for treatment follow-up seen in the infusion room prior to cycle 2 of therapy She has very mild neuropathy in the hands but it does not bother  her She is still taking laxative for chronic constipation She got dehydrated recently On average, she drinks about 60 ounces of liquid No recent nausea or vomiting  REVIEW OF SYSTEMS:   Constitutional: Denies fevers, chills or abnormal weight loss Eyes: Denies blurriness of vision Ears, nose, mouth, throat, and face: Denies mucositis or sore throat Respiratory: Denies cough, dyspnea or wheezes Cardiovascular: Denies palpitation, chest discomfort or lower extremity swelling Skin: Denies abnormal skin rashes Behavioral/Psych: Mood is stable, no new changes  All other systems were reviewed with the patient and are negative.  I have reviewed the past medical history, past surgical history, social history and family history with the patient and they are unchanged from previous note.  ALLERGIES:  is allergic to dilaudid [hydromorphone hcl], morphine and related, and sudafed [pseudoephedrine hcl].  MEDICATIONS:  Current Outpatient Medications  Medication Sig Dispense Refill   acetaminophen (TYLENOL) 325 MG tablet Take 650 mg by mouth every 6 (six) hours as needed for headache (pain).     acetaminophen (TYLENOL) 500 MG tablet Take 2 tablets (1,000 mg total) by mouth every 6 (six) hours as needed for mild pain.     atenolol (TENORMIN) 25 MG tablet TAKE 1 TABLET BY MOUTH EVERY DAY 30 tablet 2   cloNIDine (CATAPRES) 0.1 MG tablet TAKE 1 TABLET BY MOUTH EVERY DAY 30 tablet 1   dexamethasone (DECADRON) 4 MG tablet Take 2 tabs at the night before and 2 tab the morning of chemotherapy, every 3 weeks, by mouth x 6 cycles 36 tablet 6   doxazosin (CARDURA) 4 MG tablet TAKE 1 TABLET BY MOUTH EVERY DAY 30 tablet 1   hydrochlorothiazide (HYDRODIURIL)  25 MG tablet TAKE 1 TABLET (25 MG TOTAL) BY MOUTH DAILY. 30 tablet 3   lidocaine-prilocaine (EMLA) cream Apply to affected area once daily as directed. (Patient taking differently: 1 application  as needed (access port). Apply to affected area once daily as  directed.) 30 g 3   lisinopril (ZESTRIL) 20 MG tablet TAKE 1 TABLET BY MOUTH EVERY DAY IN THE EVENING 60 tablet 0   ondansetron (ZOFRAN) 8 MG tablet Take 1 tablet (8 mg total) by mouth every 8 (eight) hours as needed for nausea or vomiting. Start on the third day after chemotherapy. 30 tablet 1   prochlorperazine (COMPAZINE) 10 MG tablet Take 1 tablet (10 mg total) by mouth every 6 (six) hours as needed for nausea or vomiting. 30 tablet 1   traMADol (ULTRAM) 50 MG tablet Take 1 tablet (50 mg total) by mouth every 6 (six) hours as needed. 30 tablet 0   venlafaxine XR (EFFEXOR-XR) 37.5 MG 24 hr capsule TAKE 1 CAPSULE BY MOUTH DAILY WITH BREAKFAST 90 capsule 3   No current facility-administered medications for this visit.   Facility-Administered Medications Ordered in Other Visits  Medication Dose Route Frequency Provider Last Rate Last Admin   sodium chloride flush (NS) 0.9 % injection 10 mL  10 mL Intravenous PRN Alvy Bimler, Lyanna Blystone, MD   10 mL at 02/11/17 0839   sodium chloride flush (NS) 0.9 % injection 10 mL  10 mL Intravenous PRN Alvy Bimler, Bianey Tesoro, MD   10 mL at 03/04/17 1510    SUMMARY OF ONCOLOGIC HISTORY: Oncology History Overview Note  Neg genetics High grade serous    Adenocarcinoma of right fallopian tube ([place])  08/12/2016 Initial Diagnosis   The patient has many months of vague symptoms of fullness in the pelvis, urinary frequency and difficulty with defecation. She denies vaginal bleeding or rectal bleeding.     08/12/2016 Imaging   Abdomen X-ray in the ER: Moderate colonic stool burden without evidence of enteric obstruction   11/13/2016 Imaging   TVUS was performed on 11/13/16 which showed a large hypoechoic lobular mass seen posterior to the uterus measuring 18.2x16.1x11.8cm, blood flow was seen along the periphery of the mass. It was considered to be likely to be a fibroid vs pelvic mass vs ovarian mass. Neither ovary was removed. Moderate amount of free fluid in the pelvis. THe uterus  measures 4x10x4cm. The endometrium is 4m.    12/05/2016 Imaging   MR pelvis 1. Mild limitations as detailed above. 2. Heterogeneous pelvic mass is favored to arise from the right ovary. Favor a solid ovarian neoplasm such as fibroma/Brenner's tumor. Given lesion size and heterogeneous T2 signal out, complicating torsion cannot be excluded. 3. Moderate abdominopelvic ascites, without specific evidence of peritoneal metastasis. Consider further evaluation with contrast-enhanced abdominopelvic CT.    12/26/2016 Pathology Results   1. Uterus, ovaries and fallopian tubes - ADENOCARCINOMA INVOLVING RIGHT FALLOPIAN TUBE, RIGHT AND LEFT OVARIES, UTERINE SEROSA AND PELVIC MASS. - SEE ONCOLOGY TABLE AND COMMENT. - UTERINE CERVIX, ENDOMETRIUM, MYOMETRIUM AND LEFT FALLOPIAN TUBE FREE OF TUMOR. 2. Omentum, resection for tumor - ADENOCARCINOMA. Microscopic Comment 1. ONCOLOGY TABLE - FALLOPIAN TUBE. SEE COMMENT 1. Specimen, including laterality: Uterus, bilateral adnexa, pelvic mass and omentum. 2. Procedure: Hysterectomy with bilateral salpingo-oophorectomy, pelvic mass excision and omentectomy. 3. Lymph node sampling performed: No 4. Tumor site: See comment. 5. Tumor location in fallopian tube: Right fallopian tube fimbria 6. Specimen integrity (intact/ruptured/disrupted): Intact 7. Tumor size (cm): 18 cm, see comment. 8. Histologic type: Adenocarcinoma 9.  Grade: High grade 10. Microscopic tumor extension: Tumor involves right fallopian tube, right and left ovaries, uterine serosa, pelvic mass and omentum. 11. Margins: See comment. 12. Lymph-Vascular invasion: Present 13. Lymph nodes: # examined: 0; # positive: N/A 14. TNM: pT3c, pNX 15. FIGO Stage (based on pathologic findings, needs clinical correlation: III-C 16. Comments: There is an 18 cm pelvic mass which is a high grade adenocarcinoma and there is a 12.2 cm  segment of omentum which is extensively involved with adenocarcinoma. The tumor  also involves the fimbria of the right fallopian tube and the parenchyma of the right and left ovaries as well as uterine serosa. The fimbria of the right fallopian has intraepithelial atypia consistent with a precursor lesion and therefore this is most consistent with primary fallopian tube adenocarcinoma. A primary peritoneal serous carcinoma is a less likely possibility.    12/26/2016 Pathology Results   PERITONEAL/ASCITIC FLUID(SPECIMEN 1 OF 1 COLLECTED 12/26/16): POORLY DIFFERENTIATED ADENOCARCINOMA   12/26/2016 Surgery   Procedure(s) Performed: Exploratory laparotomy with total abdominal hysterectomy, bilateral salpingo-oophorectomy, omentectomy radical tumor debulking for ovarian cancer .   Surgeon: Thereasa Solo, MD.      Operative Findings: 20cm left ovarian mass densely adherent to the posterior uterus, right tube and ovary, cervix, sigmoid colon. 10cm omental cake. 4L ascites. 54m size tumor nodules on the serosa of the terminal ileum and proximal sigmoid colon. 119mnodules on right diaphragm.    This represented an optimal cytoreduction (R1) with 3m28modules on intestine and diaphragm representing gross visible disease   01/16/2017 Procedure   Placement of single lumen port a cath via right internal jugular vein. The catheter tip lies at the cavo-atrial junction. A power injectable port a cath was placed and is ready for immediate use.   01/17/2017 Imaging   1. 3.5 x 7.2 x 4.6 cm fluid collection along the vaginal cuff, posterior the bladder and extending into the right adnexal space. This lesion demonstrates rim enhancement. Imaging features could be related to a loculated postoperative seroma or hematoma. Superinfection cannot be excluded by CT. Given debulking surgery was 3 weeks ago, this entire structure is un likely to represent neoplasm, but peritoneal involvement could have this appearance. 2. Small fluid collection in the left para colic gutter without rim enhancement. 3.  Irregular/nodular appearance of the peritoneal M in the anatomic pelvis with areas of subtle nodularity between the stomach and the spleen. Close attention in these regions on follow-up recommended as metastatic disease is a concern. 4. Mildly enlarged hepatoduodenal ligament lymph node. Attention on follow-up recommended.   01/20/2017 Tumor Marker   Patient's tumor was tested for the following markers: CA-125 Results of the tumor marker test revealed 46.6   01/28/2017 Genetic Testing       Negative genetic testing on the [facility].  The [facility] panel offered by [employer] [employer] sequencing and deletion/duplication testing of the following 28 genes: APC, ATM, BARD1, BMPR1A, BRCA1, BRCA2, BRIP1, CHD1, CDK4, CDKN2A, CHEK2, EPCAM (large rearrangement only), MLH1, MSH2, MSH6, MUTYH, NBN, PALB2, PMS2, PTEN, RAD51C, RAD51D, SMAD4, STK11, and TP53. Sequencing was performed for select regions of POLE and POLD1, and large rearrangement analysis was performed for select regions of GREM1. The report date is January 28, 2017.  HRD testing looking for genomic instability and BRCA mutations was negative.  The report date of this test is January 27, 2017.    01/31/2017 Tumor Marker   Patient's tumor was tested for the following markers: CA-125  Results of the tumor marker test revealed 22.4   03/04/2017 Tumor Marker   Patient's tumor was tested for the following markers: CA-125 Results of the tumor marker test revealed 13.8   04/18/2017 Tumor Marker   Patient's tumor was tested for the following markers: CA-125 Results of the tumor marker test revealed 11.9   05/05/2017 Tumor Marker   Patient's tumor was tested for the following markers: CA-125 Results of the tumor marker test revealed 12   05/26/2017 Tumor Marker   Patient's tumor was tested for the following markers: CA-125 Results of the tumor marker test revealed 10.6   05/26/2017 Imaging   1. A previously noted  postoperative fluid collection in the low pelvis and residual ascites seen on the prior examination has resolved on today's study. No findings to suggest residual/recurrent disease on today's examination. No definite solid organ metastasis identified in the abdomen or pelvis. No lymphadenopathy. 2. Aortic atherosclerosis.   06/10/2017 Procedure   Successful right IJ vein Port-A-Cath explant.   03/09/2018 Tumor Marker   Patient's tumor was tested for the following markers: CA-125 Results of the tumor marker test revealed 12.1   05/26/2018 Tumor Marker   Patient's tumor was tested for the following markers: CA-125 Results of the tumor marker test revealed 13.8   09/09/2018 Tumor Marker   Patient's tumor was tested for the following markers: CA-125 Results of the tumor marker test revealed 23.9   12/18/2018 Tumor Marker   Patient's tumor was tested for the following markers: CA-125 Results of the tumor marker test revealed 43.8   02/27/2019 - 02/28/2019 Hospital Admission   She was admitted for bowel obstruction   02/27/2019 Imaging   1. High-grade small bowel obstruction with transition point in the pelvis in an area of suspected desmoplastic reaction surrounding a 1.3 cm spiculated nodule in the mesentery, suspicious for carcinoid. There is hyperenhancement near the tip of the appendix and loss of the normal fat plane between it and the adjacent sigmoid colon, potentially the site of primary lesion. 2. Multiple new small subcentimeter hyperdense lesions scattered along the liver capsule, concerning for metastases. Enlarged hyperenhancing gastrohepatic and portacaval lymph nodes concerning for nodal metastases. 3. Very mild right hydroureteronephrosis to the level of the desmoplastic reaction in the pelvis, potentially involving the right ureter. 4. Infraumbilical ventral abdominal wall diastasis containing a small nondilated portion of transverse colon. 5. Trace ascites. 6. Cholelithiasis. 7.   Aortic atherosclerosis (ICD10-I70.0).     03/05/2019 Tumor Marker   Patient's tumor was tested for the following markers: CA-125 Results of the tumor marker test revealed 70.1   03/12/2019 Echocardiogram   IMPRESSIONS     1. Left ventricular ejection fraction, by visual estimation, is 60 to 65%. The left ventricle has normal function. There is no left ventricular hypertrophy.  2. Left ventricular diastolic parameters are consistent with Grade I diastolic dysfunction (impaired relaxation).  3. Global right ventricle has normal systolic function.The right ventricular size is normal. No increase in right ventricular wall thickness.  4. Left atrial size was normal.  5. Right atrial size was normal.  6. The pericardium was not assessed.  7. The mitral valve is normal in structure. No evidence of mitral valve regurgitation.  8. The tricuspid valve is normal in structure. Tricuspid valve regurgitation is trivial.  9. The aortic valve is normal in structure. Aortic valve regurgitation is not visualized. 10. The pulmonic valve was grossly normal. Pulmonic valve regurgitation is not visualized. 11. The average left ventricular global  longitudinal strain is -17.6 %.   03/16/2019 Procedure   Successful placement of a right internal jugular approach power injectable Port-A-Cath. The catheter is ready for immediate use.     04/19/2019 Tumor Marker   Patient's tumor was tested for the following markers: CA-125 Results of the tumor marker test revealed 39.8   05/17/2019 Tumor Marker   Patient's tumor was tested for the following markers: CA-125 Results of the tumor marker test revealed 27   05/28/2019 Tumor Marker   Patient's tumor was tested for the following markers: CA-125 Results of the tumor marker test revealed 27.7.   06/11/2019 Imaging   1. No new or progressive metastatic disease in the abdomen or pelvis. 2. Tiny noncalcified perisplenic implant is decreased. Calcified pericardiophrenic,  retroperitoneal and bilateral inguinal lymph nodes and scattered small calcified perihepatic and perisplenic implants are stable. 3.  Aortic Atherosclerosis (ICD10-I70.0).       06/17/2019 Echocardiogram    1. Left ventricular ejection fraction, by estimation, is 65 to 70%. The left ventricle has normal function. The left ventricle has no regional wall motion abnormalities. Left ventricular diastolic parameters were normal.  2. Right ventricular systolic function is normal. The right ventricular size is normal.  3. The mitral valve is normal in structure and function. No evidence of mitral valve regurgitation. No evidence of mitral stenosis.  4. The aortic valve is normal in structure and function. Aortic valve regurgitation is not visualized. No aortic stenosis is present.   06/21/2019 Tumor Marker   Patient's tumor was tested for the following markers: CA-125 Results of the tumor marker test revealed 18.7   07/19/2019 Tumor Marker   Patient's tumor was tested for the following markers: CA-125 Results of the tumor marker test revealed 16.7   08/30/2019 Tumor Marker   Patient's tumor was tested for the following markers: CA-125. Results of the tumor marker test revealed 13.9   09/15/2019 Echocardiogram    1. Left ventricular ejection fraction, by estimation, is 65 to 70%. The left ventricle has normal function. The left ventricle has no regional wall motion abnormalities. Left ventricular diastolic parameters are consistent with Grade I diastolic dysfunction (impaired relaxation). The average left ventricular global longitudinal strain is 18.1 %. The global longitudinal strain is normal.  2. Right ventricular systolic function is normal. The right ventricular size is normal.  3. The mitral valve is normal in structure. No evidence of mitral valve regurgitation. No evidence of mitral stenosis.  4. The aortic valve is normal in structure. Aortic valve regurgitation is not visualized. No aortic  stenosis is present.  5. The inferior vena cava is normal in size with greater than 50% respiratory variability, suggesting right atrial pressure of 3 mmHg.     09/24/2019 Imaging   1. Stable exam. No new or progressive metastatic disease on today's study. 2. Small subcapsular hypodensity in the lateral spleen, new in the interval, but indeterminate. Attention on follow-up recommended. 3. The calcified upper abdominal and groin lymph nodes are stable as are the scattered calcified and noncalcified peritoneal implants. 4. Stable midline ventral hernia containing a short segment of colon without complicating features. 5. Aortic Atherosclerosis (ICD10-I70.0).     10/04/2019 - 09/21/2021 Chemotherapy   Patient is on Treatment Plan : ovarian Bevacizumab q21d     10/04/2019 Tumor Marker   Patient's tumor was tested for the following markers: CA-125. Results of the tumor marker test revealed 12.9.   11/01/2019 Tumor Marker   Patient's tumor was tested for the following  markers: CA-125 Results of the tumor marker test revealed 15.4   12/13/2019 Tumor Marker   Patient's tumor was tested for the following markers: CA-125 Results of the tumor marker test revealed 14.8   12/31/2019 Imaging   1. Unchanged tiny peritoneal nodule adjacent to the spleen measuring 6 mm. Other previously noted peritoneal nodules are imperceptible on present examination. 2. Unchanged prominent, partially calcified portacaval lymph node and bilateral inguinal lymph nodes. 3. Unchanged subtle, nonspecific subcapsular lesion of the spleen.  4. No evidence of new metastatic disease in the abdomen or pelvis. 5. Status post hysterectomy. 6. Unchanged low midline ventral abdominal hernia containing a single loop of nonobstructed transverse colon. 7. Cholelithiasis. 8. Aortic Atherosclerosis (ICD10-I70.0).       01/03/2020 Tumor Marker   Patient's tumor was tested for the following markers: CA-125 Results of the tumor marker  test revealed 11.7.   01/24/2020 Tumor Marker   Patient's tumor was tested for the following markers: CA-125. Results of the tumor marker test revealed 15.1   03/06/2020 Tumor Marker   Patient's tumor was tested for the following markers: CA-125. Results of the tumor marker test revealed 20.3   03/27/2020 Tumor Marker   Patient's tumor was tested for the following markers: CA-125 Results of the tumor marker test revealed 23.7   05/11/2020 Tumor Marker   Patient's tumor was tested for the following markers: CA-125. Results of the tumor marker test revealed 25.4   06/23/2020 Imaging   1. Cholelithiasis with gallbladder wall thickening and mild infiltrative edema in the porta hepatis, raising the possibility of acute cholecystitis. There is truncation of the common bile duct distally and distal choledocholithiasis is difficult to exclude. Correlate with bilirubin levels. If clinically warranted, MRCP could be utilized for further characterization. 2. Stable tiny perisplenic nodule. 3. Other imaging findings of potential clinical significance: Small type 1 hiatal hernia. Prominent stool throughout the colon favors constipation. Ventral infraumbilical hernia contains transverse colon without findings of strangulation or obstruction. Small supraumbilical hernia contains adipose tissue. Lumbar degenerative disc disease at L5-S1. Stable tiny nodule along the lateral margin of the spleen, nonspecific. 4. Aortic atherosclerosis.     12/15/2020 Imaging   No evidence of recurrent or metastatic carcinoma within the abdomen or pelvis.   Stable ventral hernia containing transverse colon. No evidence of bowel obstruction or strangulation.   Cholelithiasis. No radiographic evidence of cholecystitis.   Tiny hiatal hernia.   Aortic Atherosclerosis (ICD10-I70.0).     06/11/2021 Imaging   1. Numerous sub 4 mm pulmonary nodules in the lungs some of which have a cavitary appearance. I think it is unlikely  this is metastatic disease and more likely a possible immunotherapy related process such as a sarcoid like reaction. Recommend full chest CT further evaluation. 2. Stable small calcified retroperitoneal and mesenteric lymph nodes. No findings suspicious for recurrent peritoneal carcinomatosis. 3. Stable lower abdominal wall hernia containing part of the transverse colon. 4. Cholelithiasis.   09/27/2021 Imaging   1. Numerous small pulmonary nodules throughout the lungs, the majority of which are cavitary. These are all subcentimeter, however nodules that were seen in the bilateral lung bases on prior CT dated 06/08/2021 appear slightly increased in size and number. Behavior is highly suspicious for pulmonary metastatic disease despite the unusual appearance, however as previously reported general differential considerations include atypical infection and inflammation, such as drug-induced sarcoidosis like reaction.  2. Unchanged calcified lymph nodes in the abdomen and pelvis, consistent with treated nodal metastases. 3. Cholelithiasis with wall  thickening and pericholecystic fluid. This appearance is similar to prior examination and suggests chronic cholecystitis. Correlate for clinical symptoms. Mild intrahepatic biliary ductal dilatation, similar to prior examination. 4. Midline ventral hernia containing a single nonobstructed loop of mid transverse colon.     12/25/2021 Imaging   1. Again noted are multiple thin walled cavitary lung nodules throughout both lungs. These are similar in size and multiplicity when compared with the exam from 09/26/2021. Etiology remains indeterminate, although metastatic disease cannot be excluded with a high degree of certainty. 2. Stable calcified upper abdominal lymph nodes consistent with treated nodal metastases. 3. Gallstones. 4. Aortic Atherosclerosis (ICD10-I70.0).   01/30/2022 Tumor Marker   Patient's tumor was tested for the following markers:  CA-125. Results of the tumor marker test revealed 59.3.   01/31/2022 Imaging   1. No substantial interval change in exam. 2. Innumerable tiny cavitary pulmonary nodules in the lung bases. As noted previously, metastatic disease would be distinct consideration although imaging features are nonspecific and infectious/inflammatory etiology is not excluded. 3. Calcified nodal disease in the upper abdomen and pelvis shows no substantial interval change. Scattered noncalcified nodules in the mesentery are similar to prior. 4. Markedly large stool volume throughout the colon. Imaging features suggestive of clinical constipation. 5. Cholelithiasis with ill-defined appearance of the gallbladder wall. Appearance is stable in the interval. 6. Trace free fluid in the cul-de-sac. 7. Midline ventral hernia contains a short segment of transverse colon without complicating features. 8. Aortic Atherosclerosis (ICD10-I70.0).   03/07/2022 Surgery   Procedure Date:  03/07/2022  Pre-operative Diagnosis:  Symptomatic cholelithiasis   Post-operative Diagnosis: Symptomatic cholelithiasis, peritoneal implants with history of right fallopian tube cancer.   Procedure:   Robotic assisted cholecystectomy with ICG FireFly cholangiogram Robotic assisted abdominal wall peritoneal implant excisional biopsy x 3.   Surgeon:  Melvyn Neth, MD  Specimens:   Gallbladder Peritoneal implants x 3    Indications for Procedure:  This is a 67 y.o. female who presents with abdominal pain and workup revealing symptomatic cholelithiasis.  The benefits, complications, treatment options, and expected outcomes were discussed with the patient. The risks of bleeding, infection, recurrence of symptoms, failure to resolve symptoms, bile duct damage, bile duct leak, retained common bile duct stone, bowel injury, and need for further procedures were all discussed with the patient and she was willing to proceed.     03/07/2022  Pathology Results   SURGICAL PATHOLOGY  CASE: [phone]  PATIENT: Tenna Child  Surgical Pathology Report   Specimen Submitted:  A. Gallbladder  B. Abdominal wall   Clinical History: Symptomatic cholelithiasis   DIAGNOSIS:  A. GALLBLADDER; CHOLECYSTECTOMY:  - CHRONIC CHOLECYSTITIS WITH CHOLELITHIASIS.  - NEGATIVE FOR DYSPLASIA AND MALIGNANCY.  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   B. SOFT TISSUE, ABDOMINAL WALL; BIOPSY:  - POSITIVE FOR MALIGNANCY.  - METASTATIC CARCINOMA, COMPATIBLE WITH HIGH-GRADE SEROUS CARCINOMA OF GYNECOLOGIC PRIMARY.   Comment:  Immunohistochemical studies were performed on neoplastic cells in both sources. Tumor cells in both are positive for CK7, Pax-8, and WT-1, supporting the above diagnosis. P53 demonstrates strong and diffuse expression, indicative of mutated expression pattern.   There is sufficient tissue present for ancillary molecular testing if desired.    04/04/2022 Imaging   1. Mild progression of partially calcified peritoneal metastatic implants surrounding the liver, in the porta hepatis and in the periumbilical region. No generalized ascites or peritoneal nodularity status post omentectomy. 2. Grossly stable  cavitary lesions at both lung bases, presumably reflecting metastatic disease. 3. No other evidence of progressive metastatic disease within the abdomen or pelvis. 4. Enlarging ventral hernia containing a portion of the transverse colon. No evidence of bowel obstruction or inflammation. 5.  Aortic Atherosclerosis (ICD10-I70.0).   04/08/2022 -  Chemotherapy   Patient is on Treatment Plan : OVARIAN Carboplatin + Paclitaxel + Bevacizumab q21d      04/09/2022 Tumor Marker   Patient's tumor was tested for the following markers: CA-125. Results of the tumor marker test revealed 86.7.     PHYSICAL EXAMINATION: ECOG PERFORMANCE STATUS: 1 - Symptomatic  but completely ambulatory GENERAL:alert, no distress and comfortable NEURO: alert & oriented x 3 with fluent speech, no focal motor/sensory deficits  LABORATORY DATA:  I have reviewed the data as listed    Component Value Date/Time   NA 136 04/08/2022 0800   NA 139 04/14/2017 0742   K 4.5 04/08/2022 0800   K 4.0 04/14/2017 0742   CL 104 04/08/2022 0800   CO2 24 04/08/2022 0800   CO2 21 (L) 04/14/2017 0742   GLUCOSE 203 (H) 04/08/2022 0800   GLUCOSE 247 (H) 04/14/2017 0742   BUN 26 (H) 04/08/2022 0800   BUN 13.1 04/14/2017 0742   CREATININE 1.56 (H) 04/08/2022 0800   CREATININE 0.9 04/14/2017 0742   CALCIUM 9.0 04/08/2022 0800   CALCIUM 9.2 04/14/2017 0742   PROT 5.7 (L) 04/08/2022 0800   PROT 7.3 04/14/2017 0742   ALBUMIN 3.5 04/08/2022 0800   ALBUMIN 3.9 04/14/2017 0742   AST 14 (L) 04/08/2022 0800   AST 17 04/14/2017 0742   ALT 8 04/08/2022 0800   ALT 14 04/14/2017 0742   ALKPHOS 46 04/08/2022 0800   ALKPHOS 70 04/14/2017 0742   BILITOT 0.4 04/08/2022 0800   BILITOT 0.37 04/14/2017 0742   GFRNONAA 36 (L) 04/08/2022 0800   GFRAA >60 01/24/2020 0924   GFRAA >60 07/19/2019 0951    No results found for: "SPEP", "UPEP"  Lab Results  Component Value Date   WBC 8.5 04/29/2022   NEUTROABS 7.4 04/29/2022   HGB 11.1 (L) 04/29/2022   HCT 33.4 (L) 04/29/2022   MCV 95.2 04/29/2022   PLT 186 04/29/2022      Chemistry      Component Value Date/Time   NA 136 04/08/2022 0800   NA 139 04/14/2017 0742   K 4.5 04/08/2022 0800   K 4.0 04/14/2017 0742   CL 104 04/08/2022 0800   CO2 24 04/08/2022 0800   CO2 21 (L) 04/14/2017 0742   BUN 26 (H) 04/08/2022 0800   BUN 13.1 04/14/2017 0742   CREATININE 1.56 (H) 04/08/2022 0800   CREATININE 0.9 04/14/2017 0742      Component Value Date/Time   CALCIUM 9.0 04/08/2022 0800   CALCIUM 9.2 04/14/2017 0742   ALKPHOS 46 04/08/2022 0800   ALKPHOS 70 04/14/2017 0742   AST 14 (L) 04/08/2022 0800   AST 17 04/14/2017 0742   ALT 8  04/08/2022 0800   ALT 14 04/14/2017 0742   BILITOT 0.4 04/08/2022 0800   BILITOT 0.37 04/14/2017 0742

## 2022-04-29 NOTE — Patient Instructions (Signed)
[facility]  Discharge Instructions: Thank you for choosing [facility] to provide your oncology and hematology care.   If you have a lab appointment with the [facility], please go directly to the [facility] and check in at the registration area.   Wear comfortable clothing and clothing appropriate for easy access to any Portacath or PICC line.   We strive to give you quality time with your provider. You may need to reschedule your appointment if you arrive late (15 or more minutes).  Arriving late affects you and other patients whose appointments are after yours.  Also, if you miss three or more appointments without notifying the office, you may be dismissed from the clinic at the provider's discretion.      For prescription refill requests, have your pharmacy contact our office and allow 72 hours for refills to be completed.    Today you received the following chemotherapy and/or immunotherapy agents: Bevacizumab/Taxol/Carboplatin     To help prevent nausea and vomiting after your treatment, we encourage you to take your nausea medication as directed.  BELOW ARE SYMPTOMS THAT SHOULD BE REPORTED IMMEDIATELY: *FEVER GREATER THAN 100.4 F (38 C) OR HIGHER *CHILLS OR SWEATING *NAUSEA AND VOMITING THAT IS NOT CONTROLLED WITH YOUR NAUSEA MEDICATION *UNUSUAL SHORTNESS OF BREATH *UNUSUAL BRUISING OR BLEEDING *URINARY PROBLEMS (pain or burning when urinating, or frequent urination) *BOWEL PROBLEMS (unusual diarrhea, constipation, pain near the anus) TENDERNESS IN MOUTH AND THROAT WITH OR WITHOUT PRESENCE OF ULCERS (sore throat, sores in mouth, or a toothache) UNUSUAL RASH, SWELLING OR PAIN  UNUSUAL VAGINAL DISCHARGE OR ITCHING   Items with * indicate a potential emergency and should be followed up as soon as possible or go to the Emergency Department if any problems should occur.  Please show the CHEMOTHERAPY ALERT CARD or IMMUNOTHERAPY ALERT  CARD at check-in to the Emergency Department and triage nurse.  Should you have questions after your visit or need to cancel or reschedule your appointment, please contact [facility]  Dept: [phone]  and follow the prompts.  Office hours are 8:00 a.m. to 4:30 p.m. Monday - Friday. Please note that voicemails left after 4:00 p.m. may not be returned until the following business day.  We are closed weekends and major holidays. You have access to a nurse at all times for urgent questions. Please call the main number to the clinic Dept: [phone] and follow the prompts.   For any non-urgent questions, you may also contact your provider using MyChart. We now offer e-Visits for anyone 59 and older to request care online for non-urgent symptoms. For details visit [email or web address].   Also download the MyChart app! Go to the app store, search "MyChart", open the app, select [facility], and log in with your MyChart username and password.

## 2022-04-29 NOTE — Assessment & Plan Note (Signed)
Recent treatment was complicated by slight dehydration, stable neuropathy and constipation We discussed importance of adequate hydration, keeping track on her oral intake as well as aggressive laxative therapy

## 2022-04-29 NOTE — Assessment & Plan Note (Signed)

## 2022-04-29 NOTE — Progress Notes (Signed)
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  Past medical history of CKD,  Patient receiving taxol, paraplatinum, bevacizumab.    Met with patient and husband during infusion.  Patient reports that her appetite is so-so.  Wanting to try/learn more about "juice" type shakes.  Has tried ensure/boost type shakes and does not like the texture of those shakes/smoothies.  Patient with questions regarding protein sources.   Medications: decadron, compazine, zofran  Labs: glucose 226, creatinine 1.14  Anthropometrics:   Height: 65 inches Weight: 152 lb 8 oz 150-155 lb per chart review BMI: 25   Estimated Energy Needs  Kcals: 1725-2000 Protein: 86-100 g Fluid: 1725-2000 ml  NUTRITION DIAGNOSIS:  Food and nutrition related knowledge deficit related to cancer as evidenced by questions regarding nutrition during cancer treatment    INTERVENTION:  Discussed food sources of protein. Encouraged protein food at every meal/snack Samples of boost breeze given to patient.  Discussed additional options of boost soothe, ensure clear.  Coupons given and where to purchase discussed.   Contact information provided    MONITORING, EVALUATION, GOAL: weight trends, intake   NEXT VISIT: Monday, Jan 29 during infusion  Dannell Raczkowski B. Zenia Resides, [place], [place] Registered Dietitian [phone]

## 2022-04-30 ENCOUNTER — Other Ambulatory Visit: Payer: Self-pay | Admitting: Hematology and Oncology

## 2022-04-30 ENCOUNTER — Inpatient Hospital Stay: Payer: Medicare Other | Admitting: Hematology and Oncology

## 2022-04-30 ENCOUNTER — Other Ambulatory Visit: Payer: Self-pay

## 2022-05-01 ENCOUNTER — Telehealth: Payer: Self-pay

## 2022-05-01 ENCOUNTER — Encounter: Payer: Self-pay | Admitting: Hematology and Oncology

## 2022-05-01 NOTE — Telephone Encounter (Signed)
Called her regarding mychart message sent on 2/18 with attachment of form to be filled out. Told the department that fills the form out was able to find the forms on line. The office is unable to print the form from what she sent. They are currently filling the form out and will call her when it is completed.

## 2022-05-02 ENCOUNTER — Other Ambulatory Visit: Payer: Self-pay | Admitting: Hematology and Oncology

## 2022-05-02 MED ORDER — FLUCONAZOLE 100 MG PO TABS: 100.0000 mg | ORAL_TABLET | Freq: Every day | ORAL | 0 refills | Status: DC

## 2022-05-06 ENCOUNTER — Institutional Professional Consult (permissible substitution): Payer: Medicare Other | Admitting: Pulmonary Disease

## 2022-05-06 ENCOUNTER — Other Ambulatory Visit: Payer: Self-pay

## 2022-05-06 ENCOUNTER — Telehealth: Payer: Self-pay

## 2022-05-06 NOTE — Telephone Encounter (Signed)
Called and told disability paper work ready for pick up. She ask that the office leave and the front desk and she will pick up.  Followed up on mychart message that she sent to Dr. Alvy Bimler. She is feeling better today and is able to eat again. She will eat every 2 hours during the day and push more oral fluids. She is having a hard time eating due to lack of appetite. Offered IV fluids and she declined offer. She does not feel dehydrated and will call the office back with update tomorrow.

## 2022-05-06 NOTE — Progress Notes (Deleted)
Synopsis: Referred in January 2024 for cavitary lung lesion by Heath Lark, MD  Subjective:   PATIENT ID: Holly [name] GENDER: female DOB: February 18, 1956, MRN: [id number]  No chief complaint on file.   This is a 67 year old female, past medical history of gastroesophageal reflux, ovarian cancer diagnosed with recurrent disease in November 2020.  Hypertension, hiatal hernia. Patient follows with Dr. Alvy Bimler for adenocarcinoma of the right fallopian tube.  She has anemia related to her chemotherapy as well as peripheral neuropathy.    Oncology History Overview Note  Neg genetics High grade serous    Adenocarcinoma of right fallopian tube ([place])  08/12/2016 Initial Diagnosis   The patient has many months of vague symptoms of fullness in the pelvis, urinary frequency and difficulty with defecation. She denies vaginal bleeding or rectal bleeding.     08/12/2016 Imaging   Abdomen X-ray in the ER: Moderate colonic stool burden without evidence of enteric obstruction   11/13/2016 Imaging   TVUS was performed on 11/13/16 which showed a large hypoechoic lobular mass seen posterior to the uterus measuring 18.2x16.1x11.8cm, blood flow was seen along the periphery of the mass. It was considered to be likely to be a fibroid vs pelvic mass vs ovarian mass. Neither ovary was removed. Moderate amount of free fluid in the pelvis. THe uterus measures 4x10x4cm. The endometrium is 18m.    12/05/2016 Imaging   MR pelvis 1. Mild limitations as detailed above. 2. Heterogeneous pelvic mass is favored to arise from the right ovary. Favor a solid ovarian neoplasm such as fibroma/Brenner's tumor. Given lesion size and heterogeneous T2 signal out, complicating torsion cannot be excluded. 3. Moderate abdominopelvic ascites, without specific evidence of peritoneal metastasis. Consider further evaluation with contrast-enhanced abdominopelvic CT.    12/26/2016 Pathology Results   1. Uterus, ovaries and fallopian  tubes - ADENOCARCINOMA INVOLVING RIGHT FALLOPIAN TUBE, RIGHT AND LEFT OVARIES, UTERINE SEROSA AND PELVIC MASS. - SEE ONCOLOGY TABLE AND COMMENT. - UTERINE CERVIX, ENDOMETRIUM, MYOMETRIUM AND LEFT FALLOPIAN TUBE FREE OF TUMOR. 2. Omentum, resection for tumor - ADENOCARCINOMA. Microscopic Comment 1. ONCOLOGY TABLE - FALLOPIAN TUBE. SEE COMMENT 1. Specimen, including laterality: Uterus, bilateral adnexa, pelvic mass and omentum. 2. Procedure: Hysterectomy with bilateral salpingo-oophorectomy, pelvic mass excision and omentectomy. 3. Lymph node sampling performed: No 4. Tumor site: See comment. 5. Tumor location in fallopian tube: Right fallopian tube fimbria 6. Specimen integrity (intact/ruptured/disrupted): Intact 7. Tumor size (cm): 18 cm, see comment. 8. Histologic type: Adenocarcinoma 9. Grade: High grade 10. Microscopic tumor extension: Tumor involves right fallopian tube, right and left ovaries, uterine serosa, pelvic mass and omentum. 11. Margins: See comment. 12. Lymph-Vascular invasion: Present 13. Lymph nodes: # examined: 0; # positive: N/A 14. TNM: pT3c, pNX 15. FIGO Stage (based on pathologic findings, needs clinical correlation: III-C 16. Comments: There is an 18 cm pelvic mass which is a high grade adenocarcinoma and there is a 12.2 cm  segment of omentum which is extensively involved with adenocarcinoma. The tumor also involves the fimbria of the right fallopian tube and the parenchyma of the right and left ovaries as well as uterine serosa. The fimbria of the right fallopian has intraepithelial atypia consistent with a precursor lesion and therefore this is most consistent with primary fallopian tube adenocarcinoma. A primary peritoneal serous carcinoma is a less likely possibility.    12/26/2016 Pathology Results   PERITONEAL/ASCITIC FLUID(SPECIMEN 1 OF 1 COLLECTED 12/26/16): POORLY DIFFERENTIATED ADENOCARCINOMA   12/26/2016 Surgery   Procedure(s) Performed: Exploratory  laparotomy with  total abdominal hysterectomy, bilateral salpingo-oophorectomy, omentectomy radical tumor debulking for ovarian cancer .   Surgeon: Thereasa Solo, MD.      Operative Findings: 20cm left ovarian mass densely adherent to the posterior uterus, right tube and ovary, cervix, sigmoid colon. 10cm omental cake. 4L ascites. 63m size tumor nodules on the serosa of the terminal ileum and proximal sigmoid colon. 156mnodules on right diaphragm.    This represented an optimal cytoreduction (R1) with 39m39modules on intestine and diaphragm representing gross visible disease   01/16/2017 Procedure   Placement of single lumen port a cath via right internal jugular vein. The catheter tip lies at the cavo-atrial junction. A power injectable port a cath was placed and is ready for immediate use.   01/17/2017 Imaging   1. 3.5 x 7.2 x 4.6 cm fluid collection along the vaginal cuff, posterior the bladder and extending into the right adnexal space. This lesion demonstrates rim enhancement. Imaging features could be related to a loculated postoperative seroma or hematoma. Superinfection cannot be excluded by CT. Given debulking surgery was 3 weeks ago, this entire structure is un likely to represent neoplasm, but peritoneal involvement could have this appearance. 2. Small fluid collection in the left para colic gutter without rim enhancement. 3. Irregular/nodular appearance of the peritoneal M in the anatomic pelvis with areas of subtle nodularity between the stomach and the spleen. Close attention in these regions on follow-up recommended as metastatic disease is a concern. 4. Mildly enlarged hepatoduodenal ligament lymph node. Attention on follow-up recommended.   01/20/2017 Tumor Marker   Patient's tumor was tested for the following markers: CA-125 Results of the tumor marker test revealed 46.6   01/28/2017 Genetic Testing       Negative genetic testing on the [facility].  The [facility] panel  offered by [employer] [employer] sequencing and deletion/duplication testing of the following 28 genes: APC, ATM, BARD1, BMPR1A, BRCA1, BRCA2, BRIP1, CHD1, CDK4, CDKN2A, CHEK2, EPCAM (large rearrangement only), MLH1, MSH2, MSH6, MUTYH, NBN, PALB2, PMS2, PTEN, RAD51C, RAD51D, SMAD4, STK11, and TP53. Sequencing was performed for select regions of POLE and POLD1, and large rearrangement analysis was performed for select regions of GREM1. The report date is January 28, 2017.  HRD testing looking for genomic instability and BRCA mutations was negative.  The report date of this test is January 27, 2017.    01/31/2017 Tumor Marker   Patient's tumor was tested for the following markers: CA-125 Results of the tumor marker test revealed 22.4   03/04/2017 Tumor Marker   Patient's tumor was tested for the following markers: CA-125 Results of the tumor marker test revealed 13.8   04/18/2017 Tumor Marker   Patient's tumor was tested for the following markers: CA-125 Results of the tumor marker test revealed 11.9   05/05/2017 Tumor Marker   Patient's tumor was tested for the following markers: CA-125 Results of the tumor marker test revealed 12   05/26/2017 Tumor Marker   Patient's tumor was tested for the following markers: CA-125 Results of the tumor marker test revealed 10.6   05/26/2017 Imaging   1. A previously noted postoperative fluid collection in the low pelvis and residual ascites seen on the prior examination has resolved on today's study. No findings to suggest residual/recurrent disease on today's examination. No definite solid organ metastasis identified in the abdomen or pelvis. No lymphadenopathy. 2. Aortic atherosclerosis.   06/10/2017 Procedure   Successful right IJ vein Port-A-Cath explant.   03/09/2018 Tumor  Marker   Patient's tumor was tested for the following markers: CA-125 Results of the tumor marker test revealed 12.1   05/26/2018 Tumor Marker   Patient's tumor  was tested for the following markers: CA-125 Results of the tumor marker test revealed 13.8   09/09/2018 Tumor Marker   Patient's tumor was tested for the following markers: CA-125 Results of the tumor marker test revealed 23.9   12/18/2018 Tumor Marker   Patient's tumor was tested for the following markers: CA-125 Results of the tumor marker test revealed 43.8   02/27/2019 - 02/28/2019 Hospital Admission   She was admitted for bowel obstruction   02/27/2019 Imaging   1. High-grade small bowel obstruction with transition point in the pelvis in an area of suspected desmoplastic reaction surrounding a 1.3 cm spiculated nodule in the mesentery, suspicious for carcinoid. There is hyperenhancement near the tip of the appendix and loss of the normal fat plane between it and the adjacent sigmoid colon, potentially the site of primary lesion. 2. Multiple new small subcentimeter hyperdense lesions scattered along the liver capsule, concerning for metastases. Enlarged hyperenhancing gastrohepatic and portacaval lymph nodes concerning for nodal metastases. 3. Very mild right hydroureteronephrosis to the level of the desmoplastic reaction in the pelvis, potentially involving the right ureter. 4. Infraumbilical ventral abdominal wall diastasis containing a small nondilated portion of transverse colon. 5. Trace ascites. 6. Cholelithiasis. 7.  Aortic atherosclerosis (ICD10-I70.0).     03/05/2019 Tumor Marker   Patient's tumor was tested for the following markers: CA-125 Results of the tumor marker test revealed 70.1   03/12/2019 Echocardiogram   IMPRESSIONS     1. Left ventricular ejection fraction, by visual estimation, is 60 to 65%. The left ventricle has normal function. There is no left ventricular hypertrophy.  2. Left ventricular diastolic parameters are consistent with Grade I diastolic dysfunction (impaired relaxation).  3. Global right ventricle has normal systolic function.The right  ventricular size is normal. No increase in right ventricular wall thickness.  4. Left atrial size was normal.  5. Right atrial size was normal.  6. The pericardium was not assessed.  7. The mitral valve is normal in structure. No evidence of mitral valve regurgitation.  8. The tricuspid valve is normal in structure. Tricuspid valve regurgitation is trivial.  9. The aortic valve is normal in structure. Aortic valve regurgitation is not visualized. 10. The pulmonic valve was grossly normal. Pulmonic valve regurgitation is not visualized. 11. The average left ventricular global longitudinal strain is -17.6 %.   03/16/2019 Procedure   Successful placement of a right internal jugular approach power injectable Port-A-Cath. The catheter is ready for immediate use.     04/19/2019 Tumor Marker   Patient's tumor was tested for the following markers: CA-125 Results of the tumor marker test revealed 39.8   05/17/2019 Tumor Marker   Patient's tumor was tested for the following markers: CA-125 Results of the tumor marker test revealed 27   05/28/2019 Tumor Marker   Patient's tumor was tested for the following markers: CA-125 Results of the tumor marker test revealed 27.7.   06/11/2019 Imaging   1. No new or progressive metastatic disease in the abdomen or pelvis. 2. Tiny noncalcified perisplenic implant is decreased. Calcified pericardiophrenic, retroperitoneal and bilateral inguinal lymph nodes and scattered small calcified perihepatic and perisplenic implants are stable. 3.  Aortic Atherosclerosis (ICD10-I70.0).       06/17/2019 Echocardiogram    1. Left ventricular ejection fraction, by estimation, is 65 to 70%. The left  ventricle has normal function. The left ventricle has no regional wall motion abnormalities. Left ventricular diastolic parameters were normal.  2. Right ventricular systolic function is normal. The right ventricular size is normal.  3. The mitral valve is normal in structure and  function. No evidence of mitral valve regurgitation. No evidence of mitral stenosis.  4. The aortic valve is normal in structure and function. Aortic valve regurgitation is not visualized. No aortic stenosis is present.   06/21/2019 Tumor Marker   Patient's tumor was tested for the following markers: CA-125 Results of the tumor marker test revealed 18.7   07/19/2019 Tumor Marker   Patient's tumor was tested for the following markers: CA-125 Results of the tumor marker test revealed 16.7   08/30/2019 Tumor Marker   Patient's tumor was tested for the following markers: CA-125. Results of the tumor marker test revealed 13.9   09/15/2019 Echocardiogram    1. Left ventricular ejection fraction, by estimation, is 65 to 70%. The left ventricle has normal function. The left ventricle has no regional wall motion abnormalities. Left ventricular diastolic parameters are consistent with Grade I diastolic dysfunction (impaired relaxation). The average left ventricular global longitudinal strain is 18.1 %. The global longitudinal strain is normal.  2. Right ventricular systolic function is normal. The right ventricular size is normal.  3. The mitral valve is normal in structure. No evidence of mitral valve regurgitation. No evidence of mitral stenosis.  4. The aortic valve is normal in structure. Aortic valve regurgitation is not visualized. No aortic stenosis is present.  5. The inferior vena cava is normal in size with greater than 50% respiratory variability, suggesting right atrial pressure of 3 mmHg.     09/24/2019 Imaging   1. Stable exam. No new or progressive metastatic disease on today's study. 2. Small subcapsular hypodensity in the lateral spleen, new in the interval, but indeterminate. Attention on follow-up recommended. 3. The calcified upper abdominal and groin lymph nodes are stable as are the scattered calcified and noncalcified peritoneal implants. 4. Stable midline ventral hernia containing a  short segment of colon without complicating features. 5. Aortic Atherosclerosis (ICD10-I70.0).     10/04/2019 - 09/21/2021 Chemotherapy   Patient is on Treatment Plan : ovarian Bevacizumab q21d     10/04/2019 Tumor Marker   Patient's tumor was tested for the following markers: CA-125. Results of the tumor marker test revealed 12.9.   11/01/2019 Tumor Marker   Patient's tumor was tested for the following markers: CA-125 Results of the tumor marker test revealed 15.4   12/13/2019 Tumor Marker   Patient's tumor was tested for the following markers: CA-125 Results of the tumor marker test revealed 14.8   12/31/2019 Imaging   1. Unchanged tiny peritoneal nodule adjacent to the spleen measuring 6 mm. Other previously noted peritoneal nodules are imperceptible on present examination. 2. Unchanged prominent, partially calcified portacaval lymph node and bilateral inguinal lymph nodes. 3. Unchanged subtle, nonspecific subcapsular lesion of the spleen.  4. No evidence of new metastatic disease in the abdomen or pelvis. 5. Status post hysterectomy. 6. Unchanged low midline ventral abdominal hernia containing a single loop of nonobstructed transverse colon. 7. Cholelithiasis. 8. Aortic Atherosclerosis (ICD10-I70.0).       01/03/2020 Tumor Marker   Patient's tumor was tested for the following markers: CA-125 Results of the tumor marker test revealed 11.7.   01/24/2020 Tumor Marker   Patient's tumor was tested for the following markers: CA-125. Results of the tumor marker test revealed 15.1  03/06/2020 Tumor Marker   Patient's tumor was tested for the following markers: CA-125. Results of the tumor marker test revealed 20.3   03/27/2020 Tumor Marker   Patient's tumor was tested for the following markers: CA-125 Results of the tumor marker test revealed 23.7   05/11/2020 Tumor Marker   Patient's tumor was tested for the following markers: CA-125. Results of the tumor marker test revealed  25.4   06/23/2020 Imaging   1. Cholelithiasis with gallbladder wall thickening and mild infiltrative edema in the porta hepatis, raising the possibility of acute cholecystitis. There is truncation of the common bile duct distally and distal choledocholithiasis is difficult to exclude. Correlate with bilirubin levels. If clinically warranted, MRCP could be utilized for further characterization. 2. Stable tiny perisplenic nodule. 3. Other imaging findings of potential clinical significance: Small type 1 hiatal hernia. Prominent stool throughout the colon favors constipation. Ventral infraumbilical hernia contains transverse colon without findings of strangulation or obstruction. Small supraumbilical hernia contains adipose tissue. Lumbar degenerative disc disease at L5-S1. Stable tiny nodule along the lateral margin of the spleen, nonspecific. 4. Aortic atherosclerosis.     12/15/2020 Imaging   No evidence of recurrent or metastatic carcinoma within the abdomen or pelvis.   Stable ventral hernia containing transverse colon. No evidence of bowel obstruction or strangulation.   Cholelithiasis. No radiographic evidence of cholecystitis.   Tiny hiatal hernia.   Aortic Atherosclerosis (ICD10-I70.0).     06/11/2021 Imaging   1. Numerous sub 4 mm pulmonary nodules in the lungs some of which have a cavitary appearance. I think it is unlikely this is metastatic disease and more likely a possible immunotherapy related process such as a sarcoid like reaction. Recommend full chest CT further evaluation. 2. Stable small calcified retroperitoneal and mesenteric lymph nodes. No findings suspicious for recurrent peritoneal carcinomatosis. 3. Stable lower abdominal wall hernia containing part of the transverse colon. 4. Cholelithiasis.   09/27/2021 Imaging   1. Numerous small pulmonary nodules throughout the lungs, the majority of which are cavitary. These are all subcentimeter, however nodules that were seen in  the bilateral lung bases on prior CT dated 06/08/2021 appear slightly increased in size and number. Behavior is highly suspicious for pulmonary metastatic disease despite the unusual appearance, however as previously reported general differential considerations include atypical infection and inflammation, such as drug-induced sarcoidosis like reaction.  2. Unchanged calcified lymph nodes in the abdomen and pelvis, consistent with treated nodal metastases. 3. Cholelithiasis with wall thickening and pericholecystic fluid. This appearance is similar to prior examination and suggests chronic cholecystitis. Correlate for clinical symptoms. Mild intrahepatic biliary ductal dilatation, similar to prior examination. 4. Midline ventral hernia containing a single nonobstructed loop of mid transverse colon.     12/25/2021 Imaging   1. Again noted are multiple thin walled cavitary lung nodules throughout both lungs. These are similar in size and multiplicity when compared with the exam from 09/26/2021. Etiology remains indeterminate, although metastatic disease cannot be excluded with a high degree of certainty. 2. Stable calcified upper abdominal lymph nodes consistent with treated nodal metastases. 3. Gallstones. 4. Aortic Atherosclerosis (ICD10-I70.0).   01/30/2022 Tumor Marker   Patient's tumor was tested for the following markers: CA-125. Results of the tumor marker test revealed 59.3.   01/31/2022 Imaging   1. No substantial interval change in exam. 2. Innumerable tiny cavitary pulmonary nodules in the lung bases. As noted previously, metastatic disease would be distinct consideration although imaging features are nonspecific and infectious/inflammatory etiology is not excluded.  3. Calcified nodal disease in the upper abdomen and pelvis shows no substantial interval change. Scattered noncalcified nodules in the mesentery are similar to prior. 4. Markedly large stool volume throughout the colon. Imaging  features suggestive of clinical constipation. 5. Cholelithiasis with ill-defined appearance of the gallbladder wall. Appearance is stable in the interval. 6. Trace free fluid in the cul-de-sac. 7. Midline ventral hernia contains a short segment of transverse colon without complicating features. 8. Aortic Atherosclerosis (ICD10-I70.0).   03/07/2022 Surgery   Procedure Date:  03/07/2022  Pre-operative Diagnosis:  Symptomatic cholelithiasis   Post-operative Diagnosis: Symptomatic cholelithiasis, peritoneal implants with history of right fallopian tube cancer.   Procedure:   Robotic assisted cholecystectomy with ICG FireFly cholangiogram Robotic assisted abdominal wall peritoneal implant excisional biopsy x 3.   Surgeon:  Melvyn Neth, MD  Specimens:   Gallbladder Peritoneal implants x 3    Indications for Procedure:  This is a 67 y.o. female who presents with abdominal pain and workup revealing symptomatic cholelithiasis.  The benefits, complications, treatment options, and expected outcomes were discussed with the patient. The risks of bleeding, infection, recurrence of symptoms, failure to resolve symptoms, bile duct damage, bile duct leak, retained common bile duct stone, bowel injury, and need for further procedures were all discussed with the patient and she was willing to proceed.     03/07/2022 Pathology Results   SURGICAL PATHOLOGY  CASE: [id number]  PATIENT: Tenna Child  Surgical Pathology Report   Specimen Submitted:  A. Gallbladder  B. Abdominal wall   Clinical History: Symptomatic cholelithiasis   DIAGNOSIS:  A. GALLBLADDER; CHOLECYSTECTOMY:  - CHRONIC CHOLECYSTITIS WITH CHOLELITHIASIS.  - NEGATIVE FOR DYSPLASIA AND MALIGNANCY.  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   B. SOFT TISSUE, ABDOMINAL WALL; BIOPSY:  - POSITIVE FOR MALIGNANCY.  - METASTATIC CARCINOMA, COMPATIBLE  WITH HIGH-GRADE SEROUS CARCINOMA OF GYNECOLOGIC PRIMARY.   Comment:  Immunohistochemical studies were performed on neoplastic cells in both sources. Tumor cells in both are positive for CK7, Pax-8, and WT-1, supporting the above diagnosis. P53 demonstrates strong and diffuse expression, indicative of mutated expression pattern.   There is sufficient tissue present for ancillary molecular testing if desired.    04/04/2022 Imaging   1. Mild progression of partially calcified peritoneal metastatic implants surrounding the liver, in the porta hepatis and in the periumbilical region. No generalized ascites or peritoneal nodularity status post omentectomy. 2. Grossly stable cavitary lesions at both lung bases, presumably reflecting metastatic disease. 3. No other evidence of progressive metastatic disease within the abdomen or pelvis. 4. Enlarging ventral hernia containing a portion of the transverse colon. No evidence of bowel obstruction or inflammation. 5.  Aortic Atherosclerosis (ICD10-I70.0).   04/08/2022 -  Chemotherapy   Patient is on Treatment Plan : OVARIAN Carboplatin + Paclitaxel + Bevacizumab q21d      04/09/2022 Tumor Marker   Patient's tumor was tested for the following markers: CA-125. Results of the tumor marker test revealed 86.7.      Past Medical History:  Diagnosis Date   Complication of anesthesia    slow to wake up sometimes    GERD (gastroesophageal reflux disease)    History of chemotherapy    History of hiatal hernia    Hypertension    Ovarian cancer ([place]) dx'd 12/2016   recurrent dz 02/2019   PONV (postoperative nausea and vomiting)      Family History  Problem Relation Age of Onset  Diabetes Mother    Diabetes Father    Hypertension Father    Stroke Father    COPD Father    Heart attack Sister 107   Dementia Maternal Aunt    Bone cancer Paternal Uncle        deceased at 87   Dementia Maternal Grandmother    Heart attack Paternal Grandmother     Heart attack Paternal Grandfather    Healthy Son    Healthy Son      Past Surgical History:  Procedure Laterality Date   ABDOMINAL HYSTERECTOMY  2018   DEBULKING N/A 12/26/2016   Procedure: RADICAL TUMOR DEBULKING; OMENTECTOMY;  Surgeon: Everitt Amber, MD;  Location: [facility];  Service: Gynecology;  Laterality: N/A;   ganglion cyst removed     Hital Hernia     IR FLUORO GUIDE PORT INSERTION RIGHT  01/16/2017   IR IMAGING GUIDED PORT INSERTION  03/16/2019   IR REMOVAL TUN ACCESS W/ PORT W/O FL MOD SED  06/10/2017   IR US GUIDE VASC ACCESS RIGHT  01/16/2017   LAPAROTOMY N/A 12/26/2016   Procedure: EXPLORATORY LAPAROTOMY;  Surgeon: Everitt Amber, MD;  Location: [facility];  Service: Gynecology;  Laterality: N/A;   left morton's neuroma surgery      Nissan Fundoplication      Social History   Socioeconomic History   Marital status: Married    Spouse name: [name]   Number of children: 4   Years of education: Not on file   Highest education level: Not on file  Occupational History   Occupation: Teaching laboratory technician  Tobacco Use   Smoking status: Never    Passive exposure: Past   Smokeless tobacco: Never  Vaping Use   Vaping Use: Never used  Substance and Sexual Activity   Alcohol use: No   Drug use: No   Sexual activity: Not on file  Other Topics Concern   Not on file  Social History Narrative   Not on file   Social Determinants of Health   Financial Resource Strain: Not on file  Food Insecurity: Not on file  Transportation Needs: Not on file  Physical Activity: Not on file  Stress: Not on file  Social Connections: Not on file  Intimate Partner Violence: Not on file     Allergies  Allergen Reactions   Dilaudid [Hydromorphone Hcl] Nausea And Vomiting   Morphine And Related Nausea Only   Sudafed [Pseudoephedrine Hcl] Other (See Comments)    Nervous/jittery     Outpatient Medications Prior to Visit  Medication Sig Dispense Refill   fluconazole (DIFLUCAN) 100 MG tablet Take 1 tablet  (100 mg total) by mouth daily. 7 tablet 0   acetaminophen (TYLENOL) 325 MG tablet Take 650 mg by mouth every 6 (six) hours as needed for headache (pain).     acetaminophen (TYLENOL) 500 MG tablet Take 2 tablets (1,000 mg total) by mouth every 6 (six) hours as needed for mild pain.     atenolol (TENORMIN) 25 MG tablet TAKE 1 TABLET BY MOUTH EVERY DAY 30 tablet 2   cloNIDine (CATAPRES) 0.1 MG tablet TAKE 1 TABLET BY MOUTH EVERY DAY 30 tablet 1   dexamethasone (DECADRON) 4 MG tablet Take 2 tabs at the night before and 2 tab the morning of chemotherapy, every 3 weeks, by mouth x 6 cycles 36 tablet 6   doxazosin (CARDURA) 4 MG tablet TAKE 1 TABLET BY MOUTH EVERY DAY 30 tablet 1   hydrochlorothiazide (HYDRODIURIL) 25 MG tablet TAKE 1 TABLET (  25 MG TOTAL) BY MOUTH DAILY. 30 tablet 3   lidocaine-prilocaine (EMLA) cream Apply to affected area once daily as directed. (Patient taking differently: 1 application  as needed (access port). Apply to affected area once daily as directed.) 30 g 3   lisinopril (ZESTRIL) 20 MG tablet TAKE 1 TABLET BY MOUTH EVERY DAY IN THE EVENING 60 tablet 0   ondansetron (ZOFRAN) 8 MG tablet Take 1 tablet (8 mg total) by mouth every 8 (eight) hours as needed for nausea or vomiting. Start on the third day after chemotherapy. 30 tablet 1   prochlorperazine (COMPAZINE) 10 MG tablet Take 1 tablet (10 mg total) by mouth every 6 (six) hours as needed for nausea or vomiting. 30 tablet 1   traMADol (ULTRAM) 50 MG tablet Take 1 tablet (50 mg total) by mouth every 6 (six) hours as needed. 30 tablet 0   venlafaxine XR (EFFEXOR-XR) 37.5 MG 24 hr capsule TAKE 1 CAPSULE BY MOUTH DAILY WITH BREAKFAST 90 capsule 3   Facility-Administered Medications Prior to Visit  Medication Dose Route Frequency Provider Last Rate Last Admin   sodium chloride flush (NS) 0.9 % injection 10 mL  10 mL Intravenous PRN Alvy Bimler, Ni, MD   10 mL at 02/11/17 0839   sodium chloride flush (NS) 0.9 % injection 10 mL  10 mL  Intravenous PRN Alvy Bimler, Ni, MD   10 mL at 03/04/17 1510    ROS   Objective:  Physical Exam   There were no vitals filed for this visit.   on *** LPM *** RA BMI Readings from Last 3 Encounters:  04/29/22 25.38 kg/m  04/18/22 25.09 kg/m  04/04/22 25.69 kg/m   Wt Readings from Last 3 Encounters:  04/29/22 152 lb 8 oz (69.2 kg)  04/18/22 150 lb 12 oz (68.4 kg)  04/04/22 154 lb 6.4 oz (70 kg)     CBC    Component Value Date/Time   WBC 8.5 04/29/2022 0802   WBC 5.9 01/29/2022 1411   RBC 3.51 (L) 04/29/2022 0802   HGB 11.1 (L) 04/29/2022 0802   HGB 10.2 (L) 04/14/2017 0742   HCT 33.4 (L) 04/29/2022 0802   HCT 31.3 (L) 04/14/2017 0742   PLT 186 04/29/2022 0802   PLT 211 04/14/2017 0742   MCV 95.2 04/29/2022 0802   MCV 94.8 04/14/2017 0742   MCH 31.6 04/29/2022 0802   MCHC 33.2 04/29/2022 0802   RDW 15.7 (H) 04/29/2022 0802   RDW 18.2 (H) 04/14/2017 0742   LYMPHSABS 0.7 04/29/2022 0802   LYMPHSABS 1.0 04/14/2017 0742   MONOABS 0.1 04/29/2022 0802   MONOABS 0.0 (L) 04/14/2017 0742   EOSABS 0.0 04/29/2022 0802   EOSABS 0.0 04/14/2017 0742   BASOSABS 0.0 04/29/2022 0802   BASOSABS 0.0 04/14/2017 0742    ***  Chest Imaging: ***  Pulmonary Functions Testing Results:     No data to display          FeNO: ***  Pathology: ***  Echocardiogram: ***  Heart Catheterization: ***    Assessment & Plan:   No diagnosis found.  Discussion: ***   Current Outpatient Medications:    fluconazole (DIFLUCAN) 100 MG tablet, Take 1 tablet (100 mg total) by mouth daily., Disp: 7 tablet, Rfl: 0   acetaminophen (TYLENOL) 325 MG tablet, Take 650 mg by mouth every 6 (six) hours as needed for headache (pain)., Disp: , Rfl:    acetaminophen (TYLENOL) 500 MG tablet, Take 2 tablets (1,000 mg total) by mouth every 6 (  six) hours as needed for mild pain., Disp: , Rfl:    atenolol (TENORMIN) 25 MG tablet, TAKE 1 TABLET BY MOUTH EVERY DAY, Disp: 30 tablet, Rfl: 2   cloNIDine  (CATAPRES) 0.1 MG tablet, TAKE 1 TABLET BY MOUTH EVERY DAY, Disp: 30 tablet, Rfl: 1   dexamethasone (DECADRON) 4 MG tablet, Take 2 tabs at the night before and 2 tab the morning of chemotherapy, every 3 weeks, by mouth x 6 cycles, Disp: 36 tablet, Rfl: 6   doxazosin (CARDURA) 4 MG tablet, TAKE 1 TABLET BY MOUTH EVERY DAY, Disp: 30 tablet, Rfl: 1   hydrochlorothiazide (HYDRODIURIL) 25 MG tablet, TAKE 1 TABLET (25 MG TOTAL) BY MOUTH DAILY., Disp: 30 tablet, Rfl: 3   lidocaine-prilocaine (EMLA) cream, Apply to affected area once daily as directed. (Patient taking differently: 1 application  as needed (access port). Apply to affected area once daily as directed.), Disp: 30 g, Rfl: 3   lisinopril (ZESTRIL) 20 MG tablet, TAKE 1 TABLET BY MOUTH EVERY DAY IN THE EVENING, Disp: 60 tablet, Rfl: 0   ondansetron (ZOFRAN) 8 MG tablet, Take 1 tablet (8 mg total) by mouth every 8 (eight) hours as needed for nausea or vomiting. Start on the third day after chemotherapy., Disp: 30 tablet, Rfl: 1   prochlorperazine (COMPAZINE) 10 MG tablet, Take 1 tablet (10 mg total) by mouth every 6 (six) hours as needed for nausea or vomiting., Disp: 30 tablet, Rfl: 1   traMADol (ULTRAM) 50 MG tablet, Take 1 tablet (50 mg total) by mouth every 6 (six) hours as needed., Disp: 30 tablet, Rfl: 0   venlafaxine XR (EFFEXOR-XR) 37.5 MG 24 hr capsule, TAKE 1 CAPSULE BY MOUTH DAILY WITH BREAKFAST, Disp: 90 capsule, Rfl: 3 No current facility-administered medications for this visit.  Facility-Administered Medications Ordered in Other Visits:    sodium chloride flush (NS) 0.9 % injection 10 mL, 10 mL, Intravenous, PRN, Alvy Bimler, Ni, MD, 10 mL at 02/11/17 0839   sodium chloride flush (NS) 0.9 % injection 10 mL, 10 mL, Intravenous, PRN, Alvy Bimler, Ni, MD, 10 mL at 03/04/17 1510  I spent *** minutes dedicated to the care of this patient on the date of this encounter to include pre-visit review of records, face-to-face time with the patient  discussing conditions above, post visit ordering of testing, clinical documentation with the electronic health record, making appropriate referrals as documented, and communicating necessary findings to members of the patients care team.   Garner Nash, DO [facility] 05/06/2022 11:27 AM

## 2022-05-07 ENCOUNTER — Encounter: Payer: Self-pay | Admitting: Hematology and Oncology

## 2022-05-17 ENCOUNTER — Other Ambulatory Visit: Payer: Self-pay

## 2022-05-17 MED FILL — Dexamethasone Sodium Phosphate Inj 100 MG/10ML: INTRAMUSCULAR | Qty: 1 | Status: AC

## 2022-05-17 MED FILL — Fosaprepitant Dimeglumine For IV Infusion 150 MG (Base Eq): INTRAVENOUS | Qty: 5 | Status: AC

## 2022-05-20 ENCOUNTER — Inpatient Hospital Stay (HOSPITAL_BASED_OUTPATIENT_CLINIC_OR_DEPARTMENT_OTHER): Payer: Medicare Other | Admitting: Hematology and Oncology

## 2022-05-20 ENCOUNTER — Telehealth: Payer: Self-pay

## 2022-05-20 ENCOUNTER — Inpatient Hospital Stay: Payer: Medicare Other

## 2022-05-20 ENCOUNTER — Encounter: Payer: Self-pay | Admitting: Hematology and Oncology

## 2022-05-20 ENCOUNTER — Other Ambulatory Visit (HOSPITAL_COMMUNITY): Payer: Self-pay

## 2022-05-20 ENCOUNTER — Other Ambulatory Visit: Payer: Self-pay

## 2022-05-20 VITALS — BP 134/56 | HR 68 | Temp 98.7°F | Resp 18 | Ht 65.0 in | Wt 150.4 lb

## 2022-05-20 DIAGNOSIS — C5701 Malignant neoplasm of right fallopian tube: Secondary | ICD-10-CM

## 2022-05-20 DIAGNOSIS — D6481 Anemia due to antineoplastic chemotherapy: Secondary | ICD-10-CM

## 2022-05-20 DIAGNOSIS — L299 Pruritus, unspecified: Secondary | ICD-10-CM

## 2022-05-20 DIAGNOSIS — Z5112 Encounter for antineoplastic immunotherapy: Secondary | ICD-10-CM | POA: Diagnosis not present

## 2022-05-20 DIAGNOSIS — I10 Essential (primary) hypertension: Secondary | ICD-10-CM | POA: Diagnosis not present

## 2022-05-20 DIAGNOSIS — Z7189 Other specified counseling: Secondary | ICD-10-CM

## 2022-05-20 DIAGNOSIS — T451X5A Adverse effect of antineoplastic and immunosuppressive drugs, initial encounter: Secondary | ICD-10-CM

## 2022-05-20 LAB — CBC WITH DIFFERENTIAL (CANCER CENTER ONLY)
Abs Immature Granulocytes: 0.38 10*3/uL — ABNORMAL HIGH (ref 0.00–0.07)
Basophils Absolute: 0.1 10*3/uL (ref 0.0–0.1)
Basophils Relative: 1 %
Eosinophils Absolute: 0.2 10*3/uL (ref 0.0–0.5)
Eosinophils Relative: 1 %
HCT: 30.9 % — ABNORMAL LOW (ref 36.0–46.0)
Hemoglobin: 10.3 g/dL — ABNORMAL LOW (ref 12.0–15.0)
Immature Granulocytes: 4 %
Lymphocytes Relative: 10 %
Lymphs Abs: 1.1 10*3/uL (ref 0.7–4.0)
MCH: 32.1 pg (ref 26.0–34.0)
MCHC: 33.3 g/dL (ref 30.0–36.0)
MCV: 96.3 fL (ref 80.0–100.0)
Monocytes Absolute: 1 10*3/uL (ref 0.1–1.0)
Monocytes Relative: 10 %
Neutro Abs: 7.8 10*3/uL — ABNORMAL HIGH (ref 1.7–7.7)
Neutrophils Relative %: 74 %
Platelet Count: 229 10*3/uL (ref 150–400)
RBC: 3.21 MIL/uL — ABNORMAL LOW (ref 3.87–5.11)
RDW: 17.4 % — ABNORMAL HIGH (ref 11.5–15.5)
WBC Count: 10.5 10*3/uL (ref 4.0–10.5)
nRBC: 0 % (ref 0.0–0.2)

## 2022-05-20 LAB — CMP (CANCER CENTER ONLY)
ALT: 10 U/L (ref 0–44)
AST: 14 U/L — ABNORMAL LOW (ref 15–41)
Albumin: 3.2 g/dL — ABNORMAL LOW (ref 3.5–5.0)
Alkaline Phosphatase: 52 U/L (ref 38–126)
Anion gap: 8 (ref 5–15)
BUN: 18 mg/dL (ref 8–23)
CO2: 21 mmol/L — ABNORMAL LOW (ref 22–32)
Calcium: 8.6 mg/dL — ABNORMAL LOW (ref 8.9–10.3)
Chloride: 103 mmol/L (ref 98–111)
Creatinine: 1.1 mg/dL — ABNORMAL HIGH (ref 0.44–1.00)
GFR, Estimated: 55 mL/min — ABNORMAL LOW (ref >60)
Glucose, Bld: 145 mg/dL — ABNORMAL HIGH (ref 70–99)
Potassium: 4.1 mmol/L (ref 3.5–5.1)
Sodium: 132 mmol/L — ABNORMAL LOW (ref 135–145)
Total Bilirubin: 0.5 mg/dL (ref 0.3–1.2)
Total Protein: 6.2 g/dL — ABNORMAL LOW (ref 6.5–8.1)

## 2022-05-20 LAB — TOTAL PROTEIN, URINE DIPSTICK: Protein, ur: NEGATIVE mg/dL

## 2022-05-20 MED ORDER — FAMOTIDINE IN NACL 20-0.9 MG/50ML-% IV SOLN
20.0000 mg | Freq: Once | INTRAVENOUS | Status: AC
  Administered 2022-05-20: 20 mg via INTRAVENOUS
  Filled 2022-05-20: qty 50

## 2022-05-20 MED ORDER — HEPARIN SOD (PORK) LOCK FLUSH 100 UNIT/ML IV SOLN
500.0000 [IU] | Freq: Once | INTRAVENOUS | Status: AC | PRN
  Administered 2022-05-20: 500 [IU]

## 2022-05-20 MED ORDER — SODIUM CHLORIDE 0.9 % IV SOLN
335.0000 mg | Freq: Once | INTRAVENOUS | Status: AC
  Administered 2022-05-20: 350 mg via INTRAVENOUS
  Filled 2022-05-20: qty 35

## 2022-05-20 MED ORDER — PALONOSETRON HCL INJECTION 0.25 MG/5ML
0.2500 mg | Freq: Once | INTRAVENOUS | Status: AC
  Administered 2022-05-20: 0.25 mg via INTRAVENOUS
  Filled 2022-05-20: qty 5

## 2022-05-20 MED ORDER — MAGIC MOUTHWASH W/LIDOCAINE: 5.0000 mL | Freq: Four times a day (QID) | ORAL | 0 refills | Status: DC

## 2022-05-20 MED ORDER — SODIUM CHLORIDE 0.9 % IV SOLN
7.5000 mg/kg | Freq: Once | INTRAVENOUS | Status: AC
  Administered 2022-05-20: 500 mg via INTRAVENOUS
  Filled 2022-05-20: qty 16

## 2022-05-20 MED ORDER — SODIUM CHLORIDE 0.9 % IV SOLN
10.0000 mg | Freq: Once | INTRAVENOUS | Status: AC
  Administered 2022-05-20: 10 mg via INTRAVENOUS
  Filled 2022-05-20: qty 10

## 2022-05-20 MED ORDER — SODIUM CHLORIDE 0.9 % IV SOLN: Freq: Once | INTRAVENOUS | Status: AC

## 2022-05-20 MED ORDER — SODIUM CHLORIDE 0.9 % IV SOLN
131.2500 mg/m2 | Freq: Once | INTRAVENOUS | Status: AC
  Administered 2022-05-20: 234 mg via INTRAVENOUS
  Filled 2022-05-20: qty 39

## 2022-05-20 MED ORDER — SODIUM CHLORIDE 0.9 % IV SOLN
150.0000 mg | Freq: Once | INTRAVENOUS | Status: AC
  Administered 2022-05-20: 150 mg via INTRAVENOUS
  Filled 2022-05-20: qty 150

## 2022-05-20 MED ORDER — SODIUM CHLORIDE 0.9% FLUSH
10.0000 mL | INTRAVENOUS | Status: DC | PRN
  Administered 2022-05-20: 10 mL

## 2022-05-20 MED ORDER — CETIRIZINE HCL 10 MG/ML IV SOLN
10.0000 mg | Freq: Once | INTRAVENOUS | Status: AC
  Administered 2022-05-20: 10 mg via INTRAVENOUS
  Filled 2022-05-20: qty 1

## 2022-05-20 MED ORDER — NYSTATIN 100000 UNIT/ML MT SUSP
OROMUCOSAL | 0 refills | Status: DC
  Filled 2022-05-20: qty 240, 12d supply, fill #0

## 2022-05-20 MED ORDER — SODIUM CHLORIDE 0.9% FLUSH
10.0000 mL | Freq: Once | INTRAVENOUS | Status: AC
  Administered 2022-05-20: 10 mL

## 2022-05-20 NOTE — Assessment & Plan Note (Signed)

## 2022-05-20 NOTE — Progress Notes (Signed)
Spoke w/ MD Alvy Bimler and will continue 350 mg dose of Carboplatin for now.  Larene Beach, PharmD

## 2022-05-20 NOTE — Assessment & Plan Note (Signed)
She has significant fluctuation of her blood pressure I advised the patient to document her evening blood pressure approximately an hour after her blood pressure medications She is instructed to call me if her systolic blood pressure is consistently over 160

## 2022-05-20 NOTE — Progress Notes (Signed)
[place] OFFICE PROGRESS NOTE  Patient Care Team: Mayra Neer, MD as PCP - General (Family Medicine) Heath Lark, MD as Consulting Physician (Hematology and Oncology) Everitt Amber, MD as Consulting Physician (Obstetrics and Gynecology)  ASSESSMENT & PLAN:  Adenocarcinoma of right fallopian tube [facility]) She tolerated treatment poorly with excessive fatigue and difficulties with oral intake the first week after treatment Her skin rash has improved I recommend we continue treatment as scheduled I plan to order CT imaging before her next visit We will also order additional IV fluid support this week   Anemia due to antineoplastic chemotherapy This is likely due to recent treatment. The patient denies recent history of bleeding such as epistaxis, hematuria or hematochezia. She is asymptomatic from the anemia. I will observe for now.  She does not require transfusion now. I will continue the chemotherapy at current dose without dosage adjustment.  If the anemia gets progressive worse in the future, I might have to delay her treatment or adjust the chemotherapy dose.   Essential hypertension She has significant fluctuation of her blood pressure I advised the patient to document her evening blood pressure approximately an hour after her blood pressure medications She is instructed to call me if her systolic blood pressure is consistently over 160  Itch of skin Her skin rash has improved but appears excessively dry I recommend topical emollient cream  Orders Placed This Encounter  Procedures   CT ABDOMEN PELVIS W CONTRAST    Standing Status:   Future    Standing Expiration Date:   05/21/2023    Order Specific Question:   If indicated for the ordered procedure, I authorize the administration of contrast media per Radiology protocol    Answer:   Yes    Order Specific Question:   Preferred imaging location?    Answer:   [facility]    Order Specific Question:    Radiology Contrast Protocol - do NOT remove file path    Answer:   [email or web address]    All questions were answered. The patient knows to call the clinic with any problems, questions or concerns. The total time spent in the appointment was 30 minutes encounter with patients including review of chart and various tests results, discussions about plan of care and coordination of care plan   Heath Lark, MD 05/20/2022 12:34 PM  INTERVAL HISTORY: Please see below for problem oriented charting. she returns for treatment follow-up seen prior to cycle 3 of chemotherapy with her husband She is doing better now Within the first few days after chemo, she has significant fatigue and poor oral intake requiring IV fluid support She denies nausea, vomiting or constipation Her skin rash is better but she has some itching and dry skin Denies worsening peripheral neuropathy Her blood pressure documentation revealed blood pressure numbers within normal range in the morning but higher in the afternoon  REVIEW OF SYSTEMS:   Constitutional: Denies fevers, chills or abnormal weight loss Eyes: Denies blurriness of vision Ears, nose, mouth, throat, and face: Denies mucositis or sore throat Respiratory: Denies cough, dyspnea or wheezes Cardiovascular: Denies palpitation, chest discomfort or lower extremity swelling Gastrointestinal:  Denies nausea, heartburn or change in bowel habits Lymphatics: Denies new lymphadenopathy or easy bruising Neurological:Denies numbness, tingling or new weaknesses Behavioral/Psych: Mood is stable, no new changes  All other systems were reviewed with the patient and are negative.  I have reviewed the past medical history, past surgical history, social history and family history with the  patient and they are unchanged from previous note.  ALLERGIES:  is allergic to dilaudid [hydromorphone hcl], morphine and related, and sudafed  [pseudoephedrine hcl].  MEDICATIONS:  Current Outpatient Medications  Medication Sig Dispense Refill   fluconazole (DIFLUCAN) 100 MG tablet Take 1 tablet (100 mg total) by mouth daily. (Patient not taking: Reported on 05/20/2022) 7 tablet 0   acetaminophen (TYLENOL) 325 MG tablet Take 650 mg by mouth every 6 (six) hours as needed for headache (pain).     acetaminophen (TYLENOL) 500 MG tablet Take 2 tablets (1,000 mg total) by mouth every 6 (six) hours as needed for mild pain.     atenolol (TENORMIN) 25 MG tablet TAKE 1 TABLET BY MOUTH EVERY DAY 30 tablet 2   cloNIDine (CATAPRES) 0.1 MG tablet TAKE 1 TABLET BY MOUTH EVERY DAY (Patient not taking: Reported on 05/20/2022) 30 tablet 1   dexamethasone (DECADRON) 4 MG tablet Take 2 tabs at the night before and 2 tab the morning of chemotherapy, every 3 weeks, by mouth x 6 cycles 36 tablet 6   doxazosin (CARDURA) 4 MG tablet TAKE 1 TABLET BY MOUTH EVERY DAY (Patient not taking: Reported on 05/20/2022) 30 tablet 1   hydrochlorothiazide (HYDRODIURIL) 25 MG tablet TAKE 1 TABLET (25 MG TOTAL) BY MOUTH DAILY. (Patient not taking: Reported on 05/20/2022) 30 tablet 3   lidocaine-prilocaine (EMLA) cream Apply to affected area once daily as directed. (Patient taking differently: 1 application  as needed (access port). Apply to affected area once daily as directed.) 30 g 3   lisinopril (ZESTRIL) 20 MG tablet TAKE 1 TABLET BY MOUTH EVERY DAY IN THE EVENING (Patient not taking: Reported on 05/20/2022) 60 tablet 0   ondansetron (ZOFRAN) 8 MG tablet Take 1 tablet (8 mg total) by mouth every 8 (eight) hours as needed for nausea or vomiting. Start on the third day after chemotherapy. 30 tablet 1   prochlorperazine (COMPAZINE) 10 MG tablet Take 1 tablet (10 mg total) by mouth every 6 (six) hours as needed for nausea or vomiting. 30 tablet 1   traMADol (ULTRAM) 50 MG tablet Take 1 tablet (50 mg total) by mouth every 6 (six) hours as needed. (Patient not taking: Reported on  05/20/2022) 30 tablet 0   venlafaxine XR (EFFEXOR-XR) 37.5 MG 24 hr capsule TAKE 1 CAPSULE BY MOUTH DAILY WITH BREAKFAST 90 capsule 3   No current facility-administered medications for this visit.   Facility-Administered Medications Ordered in Other Visits  Medication Dose Route Frequency Provider Last Rate Last Admin   CARBOplatin (PARAPLATIN) 350 mg in sodium chloride 0.9 % 100 mL chemo infusion  350 mg Intravenous Once Alvy Bimler, Andie Mungin, MD       heparin lock flush 100 unit/mL  500 Units Intracatheter Once PRN Alvy Bimler, Xaivier Malay, MD       PACLitaxel (TAXOL) 234 mg in sodium chloride 0.9 % 250 mL chemo infusion (> '80mg'$ /m2)  131.25 mg/m2 (Treatment Plan Recorded) Intravenous Once Alvy Bimler, Matheus Spiker, MD 96 mL/hr at 05/20/22 1122 234 mg at 05/20/22 1122   sodium chloride flush (NS) 0.9 % injection 10 mL  10 mL Intravenous PRN Alvy Bimler, Nava Song, MD   10 mL at 02/11/17 0839   sodium chloride flush (NS) 0.9 % injection 10 mL  10 mL Intravenous PRN Alvy Bimler, Latresha Yahr, MD   10 mL at 03/04/17 1510   sodium chloride flush (NS) 0.9 % injection 10 mL  10 mL Intracatheter PRN Heath Lark, MD        SUMMARY OF ONCOLOGIC HISTORY: Oncology  History Overview Note  Neg genetics High grade serous    Adenocarcinoma of right fallopian tube ([place])  08/12/2016 Initial Diagnosis   The patient has many months of vague symptoms of fullness in the pelvis, urinary frequency and difficulty with defecation. She denies vaginal bleeding or rectal bleeding.     08/12/2016 Imaging   Abdomen X-ray in the ER: Moderate colonic stool burden without evidence of enteric obstruction   11/13/2016 Imaging   TVUS was performed on 11/13/16 which showed a large hypoechoic lobular mass seen posterior to the uterus measuring 18.2x16.1x11.8cm, blood flow was seen along the periphery of the mass. It was considered to be likely to be a fibroid vs pelvic mass vs ovarian mass. Neither ovary was removed. Moderate amount of free fluid in the pelvis. THe uterus measures  4x10x4cm. The endometrium is 64m.    12/05/2016 Imaging   MR pelvis 1. Mild limitations as detailed above. 2. Heterogeneous pelvic mass is favored to arise from the right ovary. Favor a solid ovarian neoplasm such as fibroma/Brenner's tumor. Given lesion size and heterogeneous T2 signal out, complicating torsion cannot be excluded. 3. Moderate abdominopelvic ascites, without specific evidence of peritoneal metastasis. Consider further evaluation with contrast-enhanced abdominopelvic CT.    12/26/2016 Pathology Results   1. Uterus, ovaries and fallopian tubes - ADENOCARCINOMA INVOLVING RIGHT FALLOPIAN TUBE, RIGHT AND LEFT OVARIES, UTERINE SEROSA AND PELVIC MASS. - SEE ONCOLOGY TABLE AND COMMENT. - UTERINE CERVIX, ENDOMETRIUM, MYOMETRIUM AND LEFT FALLOPIAN TUBE FREE OF TUMOR. 2. Omentum, resection for tumor - ADENOCARCINOMA. Microscopic Comment 1. ONCOLOGY TABLE - FALLOPIAN TUBE. SEE COMMENT 1. Specimen, including laterality: Uterus, bilateral adnexa, pelvic mass and omentum. 2. Procedure: Hysterectomy with bilateral salpingo-oophorectomy, pelvic mass excision and omentectomy. 3. Lymph node sampling performed: No 4. Tumor site: See comment. 5. Tumor location in fallopian tube: Right fallopian tube fimbria 6. Specimen integrity (intact/ruptured/disrupted): Intact 7. Tumor size (cm): 18 cm, see comment. 8. Histologic type: Adenocarcinoma 9. Grade: High grade 10. Microscopic tumor extension: Tumor involves right fallopian tube, right and left ovaries, uterine serosa, pelvic mass and omentum. 11. Margins: See comment. 12. Lymph-Vascular invasion: Present 13. Lymph nodes: # examined: 0; # positive: N/A 14. TNM: pT3c, pNX 15. FIGO Stage (based on pathologic findings, needs clinical correlation: III-C 16. Comments: There is an 18 cm pelvic mass which is a high grade adenocarcinoma and there is a 12.2 cm  segment of omentum which is extensively involved with adenocarcinoma. The tumor also  involves the fimbria of the right fallopian tube and the parenchyma of the right and left ovaries as well as uterine serosa. The fimbria of the right fallopian has intraepithelial atypia consistent with a precursor lesion and therefore this is most consistent with primary fallopian tube adenocarcinoma. A primary peritoneal serous carcinoma is a less likely possibility.    12/26/2016 Pathology Results   PERITONEAL/ASCITIC FLUID(SPECIMEN 1 OF 1 COLLECTED 12/26/16): POORLY DIFFERENTIATED ADENOCARCINOMA   12/26/2016 Surgery   Procedure(s) Performed: Exploratory laparotomy with total abdominal hysterectomy, bilateral salpingo-oophorectomy, omentectomy radical tumor debulking for ovarian cancer .   Surgeon: EThereasa Solo MD.      Operative Findings: 20cm left ovarian mass densely adherent to the posterior uterus, right tube and ovary, cervix, sigmoid colon. 10cm omental cake. 4L ascites. 124msize tumor nodules on the serosa of the terminal ileum and proximal sigmoid colon. 52m68modules on right diaphragm.    This represented an optimal cytoreduction (R1) with 52mm48mdules on intestine and diaphragm representing gross visible disease  01/16/2017 Procedure   Placement of single lumen port a cath via right internal jugular vein. The catheter tip lies at the cavo-atrial junction. A power injectable port a cath was placed and is ready for immediate use.   01/17/2017 Imaging   1. 3.5 x 7.2 x 4.6 cm fluid collection along the vaginal cuff, posterior the bladder and extending into the right adnexal space. This lesion demonstrates rim enhancement. Imaging features could be related to a loculated postoperative seroma or hematoma. Superinfection cannot be excluded by CT. Given debulking surgery was 3 weeks ago, this entire structure is un likely to represent neoplasm, but peritoneal involvement could have this appearance. 2. Small fluid collection in the left para colic gutter without rim enhancement. 3. Irregular/nodular  appearance of the peritoneal M in the anatomic pelvis with areas of subtle nodularity between the stomach and the spleen. Close attention in these regions on follow-up recommended as metastatic disease is a concern. 4. Mildly enlarged hepatoduodenal ligament lymph node. Attention on follow-up recommended.   01/20/2017 Tumor Marker   Patient's tumor was tested for the following markers: CA-125 Results of the tumor marker test revealed 46.6   01/28/2017 Genetic Testing       Negative genetic testing on the [facility] panel.  The [facility] gene panel offered by [employer] includes sequencing and deletion/duplication testing of the following 28 genes: APC, ATM, BARD1, BMPR1A, BRCA1, BRCA2, BRIP1, CHD1, CDK4, CDKN2A, CHEK2, EPCAM (large rearrangement only), MLH1, MSH2, MSH6, MUTYH, NBN, PALB2, PMS2, PTEN, RAD51C, RAD51D, SMAD4, STK11, and TP53. Sequencing was performed for select regions of POLE and POLD1, and large rearrangement analysis was performed for select regions of GREM1. The report date is January 28, 2017.  HRD testing looking for genomic instability and BRCA mutations was negative.  The report date of this test is January 27, 2017.    01/31/2017 Tumor Marker   Patient's tumor was tested for the following markers: CA-125 Results of the tumor marker test revealed 22.4   03/04/2017 Tumor Marker   Patient's tumor was tested for the following markers: CA-125 Results of the tumor marker test revealed 13.8   04/18/2017 Tumor Marker   Patient's tumor was tested for the following markers: CA-125 Results of the tumor marker test revealed 11.9   05/05/2017 Tumor Marker   Patient's tumor was tested for the following markers: CA-125 Results of the tumor marker test revealed 12   05/26/2017 Tumor Marker   Patient's tumor was tested for the following markers: CA-125 Results of the tumor marker test revealed 10.6   05/26/2017 Imaging   1. A previously noted postoperative fluid  collection in the low pelvis and residual ascites seen on the prior examination has resolved on today's study. No findings to suggest residual/recurrent disease on today's examination. No definite solid organ metastasis identified in the abdomen or pelvis. No lymphadenopathy. 2. Aortic atherosclerosis.   06/10/2017 Procedure   Successful right IJ vein Port-A-Cath explant.   03/09/2018 Tumor Marker   Patient's tumor was tested for the following markers: CA-125 Results of the tumor marker test revealed 12.1   05/26/2018 Tumor Marker   Patient's tumor was tested for the following markers: CA-125 Results of the tumor marker test revealed 13.8   09/09/2018 Tumor Marker   Patient's tumor was tested for the following markers: CA-125 Results of the tumor marker test revealed 23.9   12/18/2018 Tumor Marker   Patient's tumor was tested for the following markers: CA-125 Results of the tumor marker test  revealed 43.8   02/27/2019 - 02/28/2019 Hospital Admission   She was admitted for bowel obstruction   02/27/2019 Imaging   1. High-grade small bowel obstruction with transition point in the pelvis in an area of suspected desmoplastic reaction surrounding a 1.3 cm spiculated nodule in the mesentery, suspicious for carcinoid. There is hyperenhancement near the tip of the appendix and loss of the normal fat plane between it and the adjacent sigmoid colon, potentially the site of primary lesion. 2. Multiple new small subcentimeter hyperdense lesions scattered along the liver capsule, concerning for metastases. Enlarged hyperenhancing gastrohepatic and portacaval lymph nodes concerning for nodal metastases. 3. Very mild right hydroureteronephrosis to the level of the desmoplastic reaction in the pelvis, potentially involving the right ureter. 4. Infraumbilical ventral abdominal wall diastasis containing a small nondilated portion of transverse colon. 5. Trace ascites. 6. Cholelithiasis. 7.  Aortic  atherosclerosis (ICD10-I70.0).     03/05/2019 Tumor Marker   Patient's tumor was tested for the following markers: CA-125 Results of the tumor marker test revealed 70.1   03/12/2019 Echocardiogram   IMPRESSIONS     1. Left ventricular ejection fraction, by visual estimation, is 60 to 65%. The left ventricle has normal function. There is no left ventricular hypertrophy.  2. Left ventricular diastolic parameters are consistent with Grade I diastolic dysfunction (impaired relaxation).  3. Global right ventricle has normal systolic function.The right ventricular size is normal. No increase in right ventricular wall thickness.  4. Left atrial size was normal.  5. Right atrial size was normal.  6. The pericardium was not assessed.  7. The mitral valve is normal in structure. No evidence of mitral valve regurgitation.  8. The tricuspid valve is normal in structure. Tricuspid valve regurgitation is trivial.  9. The aortic valve is normal in structure. Aortic valve regurgitation is not visualized. 10. The pulmonic valve was grossly normal. Pulmonic valve regurgitation is not visualized. 11. The average left ventricular global longitudinal strain is -17.6 %.   03/16/2019 Procedure   Successful placement of a right internal jugular approach power injectable Port-A-Cath. The catheter is ready for immediate use.     04/19/2019 Tumor Marker   Patient's tumor was tested for the following markers: CA-125 Results of the tumor marker test revealed 39.8   05/17/2019 Tumor Marker   Patient's tumor was tested for the following markers: CA-125 Results of the tumor marker test revealed 27   05/28/2019 Tumor Marker   Patient's tumor was tested for the following markers: CA-125 Results of the tumor marker test revealed 27.7.   06/11/2019 Imaging   1. No new or progressive metastatic disease in the abdomen or pelvis. 2. Tiny noncalcified perisplenic implant is decreased. Calcified pericardiophrenic,  retroperitoneal and bilateral inguinal lymph nodes and scattered small calcified perihepatic and perisplenic implants are stable. 3.  Aortic Atherosclerosis (ICD10-I70.0).       06/17/2019 Echocardiogram    1. Left ventricular ejection fraction, by estimation, is 65 to 70%. The left ventricle has normal function. The left ventricle has no regional wall motion abnormalities. Left ventricular diastolic parameters were normal.  2. Right ventricular systolic function is normal. The right ventricular size is normal.  3. The mitral valve is normal in structure and function. No evidence of mitral valve regurgitation. No evidence of mitral stenosis.  4. The aortic valve is normal in structure and function. Aortic valve regurgitation is not visualized. No aortic stenosis is present.   06/21/2019 Tumor Marker   Patient's tumor was tested for the  following markers: CA-125 Results of the tumor marker test revealed 18.7   07/19/2019 Tumor Marker   Patient's tumor was tested for the following markers: CA-125 Results of the tumor marker test revealed 16.7   08/30/2019 Tumor Marker   Patient's tumor was tested for the following markers: CA-125. Results of the tumor marker test revealed 13.9   09/15/2019 Echocardiogram    1. Left ventricular ejection fraction, by estimation, is 65 to 70%. The left ventricle has normal function. The left ventricle has no regional wall motion abnormalities. Left ventricular diastolic parameters are consistent with Grade I diastolic dysfunction (impaired relaxation). The average left ventricular global longitudinal strain is 18.1 %. The global longitudinal strain is normal.  2. Right ventricular systolic function is normal. The right ventricular size is normal.  3. The mitral valve is normal in structure. No evidence of mitral valve regurgitation. No evidence of mitral stenosis.  4. The aortic valve is normal in structure. Aortic valve regurgitation is not visualized. No aortic  stenosis is present.  5. The inferior vena cava is normal in size with greater than 50% respiratory variability, suggesting right atrial pressure of 3 mmHg.     09/24/2019 Imaging   1. Stable exam. No new or progressive metastatic disease on today's study. 2. Small subcapsular hypodensity in the lateral spleen, new in the interval, but indeterminate. Attention on follow-up recommended. 3. The calcified upper abdominal and groin lymph nodes are stable as are the scattered calcified and noncalcified peritoneal implants. 4. Stable midline ventral hernia containing a short segment of colon without complicating features. 5. Aortic Atherosclerosis (ICD10-I70.0).     10/04/2019 - 09/21/2021 Chemotherapy   Patient is on Treatment Plan : ovarian Bevacizumab q21d     10/04/2019 Tumor Marker   Patient's tumor was tested for the following markers: CA-125. Results of the tumor marker test revealed 12.9.   11/01/2019 Tumor Marker   Patient's tumor was tested for the following markers: CA-125 Results of the tumor marker test revealed 15.4   12/13/2019 Tumor Marker   Patient's tumor was tested for the following markers: CA-125 Results of the tumor marker test revealed 14.8   12/31/2019 Imaging   1. Unchanged tiny peritoneal nodule adjacent to the spleen measuring 6 mm. Other previously noted peritoneal nodules are imperceptible on present examination. 2. Unchanged prominent, partially calcified portacaval lymph node and bilateral inguinal lymph nodes. 3. Unchanged subtle, nonspecific subcapsular lesion of the spleen.  4. No evidence of new metastatic disease in the abdomen or pelvis. 5. Status post hysterectomy. 6. Unchanged low midline ventral abdominal hernia containing a single loop of nonobstructed transverse colon. 7. Cholelithiasis. 8. Aortic Atherosclerosis (ICD10-I70.0).       01/03/2020 Tumor Marker   Patient's tumor was tested for the following markers: CA-125 Results of the tumor marker  test revealed 11.7.   01/24/2020 Tumor Marker   Patient's tumor was tested for the following markers: CA-125. Results of the tumor marker test revealed 15.1   03/06/2020 Tumor Marker   Patient's tumor was tested for the following markers: CA-125. Results of the tumor marker test revealed 20.3   03/27/2020 Tumor Marker   Patient's tumor was tested for the following markers: CA-125 Results of the tumor marker test revealed 23.7   05/11/2020 Tumor Marker   Patient's tumor was tested for the following markers: CA-125. Results of the tumor marker test revealed 25.4   06/23/2020 Imaging   1. Cholelithiasis with gallbladder wall thickening and mild infiltrative edema in the porta  hepatis, raising the possibility of acute cholecystitis. There is truncation of the common bile duct distally and distal choledocholithiasis is difficult to exclude. Correlate with bilirubin levels. If clinically warranted, MRCP could be utilized for further characterization. 2. Stable tiny perisplenic nodule. 3. Other imaging findings of potential clinical significance: Small type 1 hiatal hernia. Prominent stool throughout the colon favors constipation. Ventral infraumbilical hernia contains transverse colon without findings of strangulation or obstruction. Small supraumbilical hernia contains adipose tissue. Lumbar degenerative disc disease at L5-S1. Stable tiny nodule along the lateral margin of the spleen, nonspecific. 4. Aortic atherosclerosis.     12/15/2020 Imaging   No evidence of recurrent or metastatic carcinoma within the abdomen or pelvis.   Stable ventral hernia containing transverse colon. No evidence of bowel obstruction or strangulation.   Cholelithiasis. No radiographic evidence of cholecystitis.   Tiny hiatal hernia.   Aortic Atherosclerosis (ICD10-I70.0).     06/11/2021 Imaging   1. Numerous sub 4 mm pulmonary nodules in the lungs some of which have a cavitary appearance. I think it is unlikely  this is metastatic disease and more likely a possible immunotherapy related process such as a sarcoid like reaction. Recommend full chest CT further evaluation. 2. Stable small calcified retroperitoneal and mesenteric lymph nodes. No findings suspicious for recurrent peritoneal carcinomatosis. 3. Stable lower abdominal wall hernia containing part of the transverse colon. 4. Cholelithiasis.   09/27/2021 Imaging   1. Numerous small pulmonary nodules throughout the lungs, the majority of which are cavitary. These are all subcentimeter, however nodules that were seen in the bilateral lung bases on prior CT dated 06/08/2021 appear slightly increased in size and number. Behavior is highly suspicious for pulmonary metastatic disease despite the unusual appearance, however as previously reported general differential considerations include atypical infection and inflammation, such as drug-induced sarcoidosis like reaction.  2. Unchanged calcified lymph nodes in the abdomen and pelvis, consistent with treated nodal metastases. 3. Cholelithiasis with wall thickening and pericholecystic fluid. This appearance is similar to prior examination and suggests chronic cholecystitis. Correlate for clinical symptoms. Mild intrahepatic biliary ductal dilatation, similar to prior examination. 4. Midline ventral hernia containing a single nonobstructed loop of mid transverse colon.     12/25/2021 Imaging   1. Again noted are multiple thin walled cavitary lung nodules throughout both lungs. These are similar in size and multiplicity when compared with the exam from 09/26/2021. Etiology remains indeterminate, although metastatic disease cannot be excluded with a high degree of certainty. 2. Stable calcified upper abdominal lymph nodes consistent with treated nodal metastases. 3. Gallstones. 4. Aortic Atherosclerosis (ICD10-I70.0).   01/30/2022 Tumor Marker   Patient's tumor was tested for the following markers:  CA-125. Results of the tumor marker test revealed 59.3.   01/31/2022 Imaging   1. No substantial interval change in exam. 2. Innumerable tiny cavitary pulmonary nodules in the lung bases. As noted previously, metastatic disease would be distinct consideration although imaging features are nonspecific and infectious/inflammatory etiology is not excluded. 3. Calcified nodal disease in the upper abdomen and pelvis shows no substantial interval change. Scattered noncalcified nodules in the mesentery are similar to prior. 4. Markedly large stool volume throughout the colon. Imaging features suggestive of clinical constipation. 5. Cholelithiasis with ill-defined appearance of the gallbladder wall. Appearance is stable in the interval. 6. Trace free fluid in the cul-de-sac. 7. Midline ventral hernia contains a short segment of transverse colon without complicating features. 8. Aortic Atherosclerosis (ICD10-I70.0).   03/07/2022 Surgery   Procedure Date:  03/07/2022  Pre-operative Diagnosis:  Symptomatic cholelithiasis   Post-operative Diagnosis: Symptomatic cholelithiasis, peritoneal implants with history of right fallopian tube cancer.   Procedure:   Robotic assisted cholecystectomy with ICG FireFly cholangiogram Robotic assisted abdominal wall peritoneal implant excisional biopsy x 3.   Surgeon:  Melvyn Neth, MD  Specimens:   Gallbladder Peritoneal implants x 3    Indications for Procedure:  This is a 67 y.o. female who presents with abdominal pain and workup revealing symptomatic cholelithiasis.  The benefits, complications, treatment options, and expected outcomes were discussed with the patient. The risks of bleeding, infection, recurrence of symptoms, failure to resolve symptoms, bile duct damage, bile duct leak, retained common bile duct stone, bowel injury, and need for further procedures were all discussed with the patient and she was willing to proceed.     03/07/2022  Pathology Results   SURGICAL PATHOLOGY  CASE: [phone]  PATIENT: Tenna Child  Surgical Pathology Report   Specimen Submitted:  A. Gallbladder  B. Abdominal wall   Clinical History: Symptomatic cholelithiasis   DIAGNOSIS:  A. GALLBLADDER; CHOLECYSTECTOMY:  - CHRONIC CHOLECYSTITIS WITH CHOLELITHIASIS.  - NEGATIVE FOR DYSPLASIA AND MALIGNANCY.  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   B. SOFT TISSUE, ABDOMINAL WALL; BIOPSY:  - POSITIVE FOR MALIGNANCY.  - METASTATIC CARCINOMA, COMPATIBLE WITH HIGH-GRADE SEROUS CARCINOMA OF GYNECOLOGIC PRIMARY.   Comment:  Immunohistochemical studies were performed on neoplastic cells in both sources. Tumor cells in both are positive for CK7, Pax-8, and WT-1, supporting the above diagnosis. P53 demonstrates strong and diffuse expression, indicative of mutated expression pattern.   There is sufficient tissue present for ancillary molecular testing if desired.    04/04/2022 Imaging   1. Mild progression of partially calcified peritoneal metastatic implants surrounding the liver, in the porta hepatis and in the periumbilical region. No generalized ascites or peritoneal nodularity status post omentectomy. 2. Grossly stable cavitary lesions at both lung bases, presumably reflecting metastatic disease. 3. No other evidence of progressive metastatic disease within the abdomen or pelvis. 4. Enlarging ventral hernia containing a portion of the transverse colon. No evidence of bowel obstruction or inflammation. 5.  Aortic Atherosclerosis (ICD10-I70.0).   04/08/2022 -  Chemotherapy   Patient is on Treatment Plan : OVARIAN Carboplatin + Paclitaxel + Bevacizumab q21d      04/09/2022 Tumor Marker   Patient's tumor was tested for the following markers: CA-125. Results of the tumor marker test revealed 86.7.     PHYSICAL EXAMINATION: ECOG PERFORMANCE STATUS: 1 - Symptomatic  but completely ambulatory  Vitals:   05/20/22 0823  BP: (!) 134/56  Pulse: 68  Resp: 18  Temp: 98.7 F (37.1 C)  SpO2: 100%   Filed Weights   05/20/22 0823  Weight: 150 lb 6.4 oz (68.2 kg)    GENERAL:alert, no distress and comfortable SKIN: Noted dry skin Previous skin rash is resolving EYES: normal, Conjunctiva are pink and non-injected, sclera clear NEURO: alert & oriented x 3 with fluent speech, no focal motor/sensory deficits  LABORATORY DATA:  I have reviewed the data as listed    Component Value Date/Time   NA 132 (L) 05/20/2022 0757   NA 139 04/14/2017 0742   K 4.1 05/20/2022 0757   K 4.0 04/14/2017 0742   CL 103 05/20/2022 0757   CO2 21 (L) 05/20/2022 0757   CO2 21 (L) 04/14/2017 0742   GLUCOSE 145 (H) 05/20/2022 0757   GLUCOSE 247 (H) 04/14/2017 4481  BUN 18 05/20/2022 0757   BUN 13.1 04/14/2017 0742   CREATININE 1.10 (H) 05/20/2022 0757   CREATININE 0.9 04/14/2017 0742   CALCIUM 8.6 (L) 05/20/2022 0757   CALCIUM 9.2 04/14/2017 0742   PROT 6.2 (L) 05/20/2022 0757   PROT 7.3 04/14/2017 0742   ALBUMIN 3.2 (L) 05/20/2022 0757   ALBUMIN 3.9 04/14/2017 0742   AST 14 (L) 05/20/2022 0757   AST 17 04/14/2017 0742   ALT 10 05/20/2022 0757   ALT 14 04/14/2017 0742   ALKPHOS 52 05/20/2022 0757   ALKPHOS 70 04/14/2017 0742   BILITOT 0.5 05/20/2022 0757   BILITOT 0.37 04/14/2017 0742   GFRNONAA 55 (L) 05/20/2022 0757   GFRAA >60 01/24/2020 0924   GFRAA >60 07/19/2019 0951    No results found for: "SPEP", "UPEP"  Lab Results  Component Value Date   WBC 10.5 05/20/2022   NEUTROABS 7.8 (H) 05/20/2022   HGB 10.3 (L) 05/20/2022   HCT 30.9 (L) 05/20/2022   MCV 96.3 05/20/2022   PLT 229 05/20/2022      Chemistry      Component Value Date/Time   NA 132 (L) 05/20/2022 0757   NA 139 04/14/2017 0742   K 4.1 05/20/2022 0757   K 4.0 04/14/2017 0742   CL 103 05/20/2022 0757   CO2 21 (L) 05/20/2022 0757   CO2 21 (L) 04/14/2017 0742   BUN 18 05/20/2022  0757   BUN 13.1 04/14/2017 0742   CREATININE 1.10 (H) 05/20/2022 0757   CREATININE 0.9 04/14/2017 0742      Component Value Date/Time   CALCIUM 8.6 (L) 05/20/2022 0757   CALCIUM 9.2 04/14/2017 0742   ALKPHOS 52 05/20/2022 0757   ALKPHOS 70 04/14/2017 0742   AST 14 (L) 05/20/2022 0757   AST 17 04/14/2017 0742   ALT 10 05/20/2022 0757   ALT 14 04/14/2017 0742   BILITOT 0.5 05/20/2022 0757   BILITOT 0.37 04/14/2017 0742

## 2022-05-20 NOTE — Telephone Encounter (Signed)
Called regarding mychart message and request for magic mouth wash. Told her Rx has been called into [facility] pharmacy. She verbalized understanding

## 2022-05-20 NOTE — Patient Instructions (Signed)
[facility]  Discharge Instructions: Thank you for choosing [facility] to provide your oncology and hematology care.   If you have a lab appointment with the [facility], please go directly to the [facility] and check in at the registration area.   Wear comfortable clothing and clothing appropriate for easy access to any Portacath or PICC line.   We strive to give you quality time with your provider. You may need to reschedule your appointment if you arrive late (15 or more minutes).  Arriving late affects you and other patients whose appointments are after yours.  Also, if you miss three or more appointments without notifying the office, you may be dismissed from the clinic at the provider's discretion.      For prescription refill requests, have your pharmacy contact our office and allow 72 hours for refills to be completed.    Today you received the following chemotherapy and/or immunotherapy agents: Bevacizumab/Taxol/Carboplatin     To help prevent nausea and vomiting after your treatment, we encourage you to take your nausea medication as directed.  BELOW ARE SYMPTOMS THAT SHOULD BE REPORTED IMMEDIATELY: *FEVER GREATER THAN 100.4 F (38 C) OR HIGHER *CHILLS OR SWEATING *NAUSEA AND VOMITING THAT IS NOT CONTROLLED WITH YOUR NAUSEA MEDICATION *UNUSUAL SHORTNESS OF BREATH *UNUSUAL BRUISING OR BLEEDING *URINARY PROBLEMS (pain or burning when urinating, or frequent urination) *BOWEL PROBLEMS (unusual diarrhea, constipation, pain near the anus) TENDERNESS IN MOUTH AND THROAT WITH OR WITHOUT PRESENCE OF ULCERS (sore throat, sores in mouth, or a toothache) UNUSUAL RASH, SWELLING OR PAIN  UNUSUAL VAGINAL DISCHARGE OR ITCHING   Items with * indicate a potential emergency and should be followed up as soon as possible or go to the Emergency Department if any problems should occur.  Please show the CHEMOTHERAPY ALERT CARD or IMMUNOTHERAPY  ALERT CARD at check-in to the Emergency Department and triage nurse.  Should you have questions after your visit or need to cancel or reschedule your appointment, please contact [facility]  Dept: [phone]  and follow the prompts.  Office hours are 8:00 a.m. to 4:30 p.m. Monday - Friday. Please note that voicemails left after 4:00 p.m. may not be returned until the following business day.  We are closed weekends and major holidays. You have access to a nurse at all times for urgent questions. Please call the main number to the clinic Dept: [phone] and follow the prompts.   For any non-urgent questions, you may also contact your provider using MyChart. We now offer e-Visits for anyone 28 and older to request care online for non-urgent symptoms. For details visit [email or web address].   Also download the MyChart app! Go to the app store, search "MyChart", open the app, select [facility], and log in with your MyChart username and password.

## 2022-05-20 NOTE — Progress Notes (Signed)
Nutrition Follow-up:  Patient with adenocarcinoma of right fallopian tube.  Patient receiving taxol, paraplatinum, bevacizumab.  Met with patient and husband during infusion.  Patient reports that appetite was not good following treatment last time. Has been treated for thrush.  This past week prior to treatment appetite has improved.  Planning fluids to be given Thursday-Saturday of this week.  Has been able to drink  chocolate milk with success.  Juice type shakes she was not able to handle.      Medications: reviewed  Labs: reviewed  Anthropometrics:   Weight 150 lb 6.4 oz on 1/29  NUTRITION DIAGNOSIS: Food and nutrition related knowledge deficit improved   INTERVENTION:  Continue whole chocolate milk for added calories and protein. Discussed Fairlife chocolate milk and shakes as options to try.      MONITORING, EVALUATION, GOAL: weight trends, intake   NEXT VISIT: Monday, Feb 19 during infusion  Holly [name], [place], [place] Registered Dietitian [phone]

## 2022-05-20 NOTE — Assessment & Plan Note (Signed)
She tolerated treatment poorly with excessive fatigue and difficulties with oral intake the first week after treatment Her skin rash has improved I recommend we continue treatment as scheduled I plan to order CT imaging before her next visit We will also order additional IV fluid support this week

## 2022-05-20 NOTE — Assessment & Plan Note (Signed)
Her skin rash has improved but appears excessively dry I recommend topical emollient cream

## 2022-05-21 ENCOUNTER — Encounter: Payer: Self-pay | Admitting: Hematology and Oncology

## 2022-05-22 LAB — CA 125: Cancer Antigen (CA) 125: 49.7 U/mL — ABNORMAL HIGH (ref 0.0–38.1)

## 2022-05-23 ENCOUNTER — Encounter: Payer: Self-pay | Admitting: Hematology and Oncology

## 2022-05-23 ENCOUNTER — Other Ambulatory Visit: Payer: Self-pay

## 2022-05-23 ENCOUNTER — Inpatient Hospital Stay: Payer: Medicare Other | Attending: Hematology and Oncology

## 2022-05-23 ENCOUNTER — Other Ambulatory Visit (HOSPITAL_COMMUNITY): Payer: Self-pay

## 2022-05-23 VITALS — BP 141/64 | HR 61 | Temp 98.2°F | Resp 18

## 2022-05-23 DIAGNOSIS — E860 Dehydration: Secondary | ICD-10-CM | POA: Diagnosis present

## 2022-05-23 DIAGNOSIS — D61818 Other pancytopenia: Secondary | ICD-10-CM | POA: Insufficient documentation

## 2022-05-23 DIAGNOSIS — C5701 Malignant neoplasm of right fallopian tube: Secondary | ICD-10-CM | POA: Diagnosis present

## 2022-05-23 DIAGNOSIS — K1231 Oral mucositis (ulcerative) due to antineoplastic therapy: Secondary | ICD-10-CM | POA: Insufficient documentation

## 2022-05-23 DIAGNOSIS — T451X5A Adverse effect of antineoplastic and immunosuppressive drugs, initial encounter: Secondary | ICD-10-CM | POA: Insufficient documentation

## 2022-05-23 DIAGNOSIS — N183 Chronic kidney disease, stage 3 unspecified: Secondary | ICD-10-CM | POA: Diagnosis not present

## 2022-05-23 DIAGNOSIS — C7989 Secondary malignant neoplasm of other specified sites: Secondary | ICD-10-CM | POA: Diagnosis not present

## 2022-05-23 DIAGNOSIS — Z5111 Encounter for antineoplastic chemotherapy: Secondary | ICD-10-CM | POA: Diagnosis present

## 2022-05-23 DIAGNOSIS — Z7189 Other specified counseling: Secondary | ICD-10-CM

## 2022-05-23 MED ORDER — HEPARIN SOD (PORK) LOCK FLUSH 100 UNIT/ML IV SOLN
500.0000 [IU] | Freq: Once | INTRAVENOUS | Status: AC | PRN
  Administered 2022-05-23: 500 [IU]

## 2022-05-23 MED ORDER — SODIUM CHLORIDE 0.9 % IV SOLN: Freq: Once | INTRAVENOUS | Status: AC

## 2022-05-23 MED ORDER — FLUCONAZOLE 100 MG PO TABS
100.0000 mg | ORAL_TABLET | Freq: Every day | ORAL | 0 refills | Status: DC
  Filled 2022-05-23: qty 7, 7d supply, fill #0

## 2022-05-23 MED ORDER — SODIUM CHLORIDE 0.9% FLUSH
10.0000 mL | Freq: Once | INTRAVENOUS | Status: AC | PRN
  Administered 2022-05-23: 10 mL

## 2022-05-23 NOTE — Progress Notes (Signed)
Patient reports SBP sustaining > 160 after atenolol for 2 consecutive days, requiring PRN clonidine.  SBP remained > 160 after clonidine.  Dr. Alvy Bimler made aware.  Per Dr. Alvy Bimler, RN informed patient to restart lisinopril in the evenings.  Patient verbalized understanding.  Angie Fava, RN

## 2022-05-23 NOTE — Patient Instructions (Signed)
Dehydration, Adult Dehydration is a condition in which there is not enough water or other fluids in the body. This happens when a person loses more fluids than he or she takes in. Important organs, such as the kidneys, brain, and heart, cannot function without a proper amount of fluids. Any loss of fluids from the body can lead to dehydration. Dehydration can be mild, moderate, or severe. It should be treated right away to prevent it from becoming severe. What are the causes? Dehydration may be caused by: Conditions that cause loss of water or other fluids, such as diarrhea, vomiting, or sweating or urinating a lot. Not drinking enough fluids, especially when you are ill or doing activities that require a lot of energy. Other illnesses and conditions, such as fever or infection. Certain medicines, such as medicines that remove excess fluid from the body (diuretics). Lack of safe drinking water. Not being able to get enough water and food. What increases the risk? The following factors may make you more likely to develop this condition: Having a long-term (chronic) illness that has not been treated properly, such as diabetes, heart disease, or kidney disease. Being 65 years of age or older. Having a disability. Living in a place that is high in altitude, where thinner, drier air causes more fluid loss. Doing exercises that put stress on your body for a long time (endurance sports). What are the signs or symptoms? Symptoms of dehydration depend on how severe it is. Mild or moderate dehydration Thirst. Dry lips or dry mouth. Dizziness or light-headedness, especially when standing up from a seated position. Muscle cramps. Dark urine. Urine may be the color of tea. Less urine or tears produced than usual. Headache. Severe dehydration Changes in skin. Your skin may be cold and clammy, blotchy, or pale. Your skin also may not return to normal after being lightly pinched and released. Little or  no tears, urine, or sweat. Changes in vital signs, such as rapid breathing and low blood pressure. Your pulse may be weak or may be faster than 100 beats a minute when you are sitting still. Other changes, such as: Feeling very thirsty. Sunken eyes. Cold hands and feet. Confusion. Being very tired (lethargic) or having trouble waking from sleep. Short-term weight loss. Loss of consciousness. How is this diagnosed? This condition is diagnosed based on your symptoms and a physical exam. You may have blood and urine tests to help confirm the diagnosis. How is this treated? Treatment for this condition depends on how severe it is. Treatment should be started right away. Do not wait until dehydration becomes severe. Severe dehydration is an emergency and needs to be treated in a hospital. Mild or moderate dehydration can be treated at home. You may be asked to: Drink more fluids. Drink an oral rehydration solution (ORS). This drink helps restore proper amounts of fluids and salts and minerals in the blood (electrolytes). Severe dehydration can be treated: With IV fluids. By correcting abnormal levels of electrolytes. This is often done by giving electrolytes through a tube that is passed through your nose and into your stomach (nasogastric tube, or NG tube). By treating the underlying cause of dehydration. Follow these instructions at home: Oral rehydration solution If told by your health care provider, drink an ORS: Make an ORS by following instructions on the package. Start by drinking small amounts, about  cup (120 mL) every 5-10 minutes. Slowly increase how much you drink until you have taken the amount recommended by your health   care provider. Eating and drinking        Drink enough clear fluid to keep your urine pale yellow. If you were told to drink an ORS, finish the ORS first and then start slowly drinking other clear fluids. Drink fluids such as: Water. Do not drink only  water. Doing that can lead to hyponatremia, which is having too little salt (sodium) in the body. Water from ice chips you suck on. Fruit juice that you have added water to (diluted fruit juice). Low-calorie sports drinks. Eat foods that contain a healthy balance of electrolytes, such as bananas, oranges, potatoes, tomatoes, and spinach. Do not drink alcohol. Avoid the following: Drinks that contain a lot of sugar. These include high-calorie sports drinks, fruit juice that is not diluted, and soda. Caffeine. Foods that are greasy or contain a lot of fat or sugar. General instructions Take over-the-counter and prescription medicines only as told by your health care provider. Do not take sodium tablets. Doing that can lead to having too much sodium in the body (hypernatremia). Return to your normal activities as told by your health care provider. Ask your health care provider what activities are safe for you. Keep all follow-up visits as told by your health care provider. This is important. Contact a health care provider if: You have muscle cramps, pain, or discomfort, such as: Pain in your abdomen and the pain gets worse or stays in one area (localizes). Stiff neck. You have a rash. You are more irritable than usual. You are sleepier or have a harder time waking than usual. You feel weak or dizzy. You feel very thirsty. Get help right away if you have: Any symptoms of severe dehydration. Symptoms of vomiting, such as: You cannot eat or drink without vomiting. Vomiting gets worse or does not go away. Vomit includes blood or green matter (bile). Symptoms that get worse with treatment. A fever. A severe headache. Problems with urination or bowel movements, such as: Diarrhea that gets worse or does not go away. Blood in your stool (feces). This may cause stool to look black and tarry. Not urinating, or urinating only a small amount of very dark urine, within 6-8 hours. Trouble  breathing. These symptoms may represent a serious problem that is an emergency. Do not wait to see if the symptoms will go away. Get medical help right away. Call your local emergency services (911 in the [place] Do not drive yourself to the hospital. Summary Dehydration is a condition in which there is not enough water or other fluids in the body. This happens when a person loses more fluids than he or she takes in. Treatment for this condition depends on how severe it is. Treatment should be started right away. Do not wait until dehydration becomes severe. Drink enough clear fluid to keep your urine pale yellow. If you were told to drink an oral rehydration solution (ORS), finish the ORS first and then start slowly drinking other clear fluids. Take over-the-counter and prescription medicines only as told by your health care provider. Get help right away if you have any symptoms of severe dehydration. This information is not intended to replace advice given to you by your health care provider. Make sure you discuss any questions you have with your health care provider. Document Revised: 08/15/2021 Document Reviewed: 11/19/2018 Elsevier Patient Education  2023 [employer]  

## 2022-05-23 NOTE — Progress Notes (Signed)
Patient stated that her mouth feels like it is on fire despite using the magic mouth wah and saline rinses 4x/day. This RN assessed her mouth and she has a mild white coating on the mid to back of tongue. Patient stated that she had thrush once before and it started feeling that way this morning. Dr. Alvy Bimler made aware and prescription sent to [facility] for diflucan, patient aware.

## 2022-05-24 ENCOUNTER — Inpatient Hospital Stay: Payer: Medicare Other

## 2022-05-24 VITALS — BP 167/69 | HR 68 | Temp 98.2°F | Resp 18

## 2022-05-24 DIAGNOSIS — C5701 Malignant neoplasm of right fallopian tube: Secondary | ICD-10-CM

## 2022-05-24 DIAGNOSIS — Z5111 Encounter for antineoplastic chemotherapy: Secondary | ICD-10-CM | POA: Diagnosis not present

## 2022-05-24 DIAGNOSIS — Z7189 Other specified counseling: Secondary | ICD-10-CM

## 2022-05-24 MED ORDER — SODIUM CHLORIDE 0.9 % IV SOLN: Freq: Once | INTRAVENOUS | Status: AC

## 2022-05-24 MED ORDER — ALTEPLASE 2 MG IJ SOLR: 2.0000 mg | Freq: Once | INTRAMUSCULAR | Status: DC | PRN

## 2022-05-24 MED ORDER — HEPARIN SOD (PORK) LOCK FLUSH 100 UNIT/ML IV SOLN: 500.0000 [IU] | Freq: Once | INTRAVENOUS | Status: DC

## 2022-05-24 MED ORDER — SODIUM CHLORIDE 0.9% FLUSH: 10.0000 mL | Freq: Once | INTRAVENOUS | Status: DC

## 2022-05-24 MED ORDER — SODIUM CHLORIDE 0.9% FLUSH: 3.0000 mL | Freq: Once | INTRAVENOUS | Status: DC | PRN

## 2022-05-24 MED ORDER — SODIUM CHLORIDE 0.9% FLUSH
10.0000 mL | Freq: Once | INTRAVENOUS | Status: AC | PRN
  Administered 2022-05-24: 10 mL

## 2022-05-24 MED ORDER — HEPARIN SOD (PORK) LOCK FLUSH 100 UNIT/ML IV SOLN: 250.0000 [IU] | Freq: Once | INTRAVENOUS | Status: DC | PRN

## 2022-05-24 MED ORDER — HEPARIN SOD (PORK) LOCK FLUSH 100 UNIT/ML IV SOLN
500.0000 [IU] | Freq: Once | INTRAVENOUS | Status: AC | PRN
  Administered 2022-05-24: 500 [IU]

## 2022-05-24 NOTE — Patient Instructions (Signed)

## 2022-05-25 ENCOUNTER — Inpatient Hospital Stay: Payer: Medicare Other

## 2022-05-25 VITALS — BP 168/76 | HR 61 | Temp 98.1°F | Resp 16

## 2022-05-25 DIAGNOSIS — Z5111 Encounter for antineoplastic chemotherapy: Secondary | ICD-10-CM | POA: Diagnosis not present

## 2022-05-25 DIAGNOSIS — Z7189 Other specified counseling: Secondary | ICD-10-CM

## 2022-05-25 DIAGNOSIS — C5701 Malignant neoplasm of right fallopian tube: Secondary | ICD-10-CM

## 2022-05-25 MED ORDER — SODIUM CHLORIDE 0.9% FLUSH
10.0000 mL | Freq: Once | INTRAVENOUS | Status: AC | PRN
  Administered 2022-05-25: 10 mL

## 2022-05-25 MED ORDER — SODIUM CHLORIDE 0.9 % IV SOLN: Freq: Once | INTRAVENOUS | Status: AC

## 2022-05-25 MED ORDER — HEPARIN SOD (PORK) LOCK FLUSH 100 UNIT/ML IV SOLN
500.0000 [IU] | Freq: Once | INTRAVENOUS | Status: AC | PRN
  Administered 2022-05-25: 500 [IU]

## 2022-05-25 NOTE — Patient Instructions (Signed)

## 2022-05-26 ENCOUNTER — Encounter: Payer: Self-pay | Admitting: Hematology and Oncology

## 2022-05-27 ENCOUNTER — Inpatient Hospital Stay: Payer: Medicare Other

## 2022-05-27 ENCOUNTER — Other Ambulatory Visit: Payer: Self-pay

## 2022-05-27 ENCOUNTER — Encounter: Payer: Self-pay | Admitting: Hematology and Oncology

## 2022-05-27 ENCOUNTER — Inpatient Hospital Stay (HOSPITAL_BASED_OUTPATIENT_CLINIC_OR_DEPARTMENT_OTHER): Payer: Medicare Other | Admitting: Hematology and Oncology

## 2022-05-27 ENCOUNTER — Other Ambulatory Visit: Payer: Self-pay | Admitting: Hematology and Oncology

## 2022-05-27 ENCOUNTER — Other Ambulatory Visit (HOSPITAL_COMMUNITY): Payer: Self-pay

## 2022-05-27 ENCOUNTER — Telehealth: Payer: Self-pay

## 2022-05-27 VITALS — BP 167/77 | HR 67 | Temp 99.8°F | Resp 15

## 2022-05-27 VITALS — BP 159/75 | HR 68 | Resp 18 | Ht 65.0 in | Wt 144.6 lb

## 2022-05-27 DIAGNOSIS — C5701 Malignant neoplasm of right fallopian tube: Secondary | ICD-10-CM

## 2022-05-27 DIAGNOSIS — N183 Chronic kidney disease, stage 3 unspecified: Secondary | ICD-10-CM | POA: Diagnosis not present

## 2022-05-27 DIAGNOSIS — Z7189 Other specified counseling: Secondary | ICD-10-CM

## 2022-05-27 DIAGNOSIS — K1231 Oral mucositis (ulcerative) due to antineoplastic therapy: Secondary | ICD-10-CM

## 2022-05-27 DIAGNOSIS — D61818 Other pancytopenia: Secondary | ICD-10-CM

## 2022-05-27 DIAGNOSIS — Z5111 Encounter for antineoplastic chemotherapy: Secondary | ICD-10-CM | POA: Diagnosis not present

## 2022-05-27 LAB — CMP (CANCER CENTER ONLY)
ALT: 10 U/L (ref 0–44)
AST: 15 U/L (ref 15–41)
Albumin: 3.3 g/dL — ABNORMAL LOW (ref 3.5–5.0)
Alkaline Phosphatase: 50 U/L (ref 38–126)
Anion gap: 7 (ref 5–15)
BUN: 33 mg/dL — ABNORMAL HIGH (ref 8–23)
CO2: 19 mmol/L — ABNORMAL LOW (ref 22–32)
Calcium: 8.9 mg/dL (ref 8.9–10.3)
Chloride: 109 mmol/L (ref 98–111)
Creatinine: 1.38 mg/dL — ABNORMAL HIGH (ref 0.44–1.00)
GFR, Estimated: 42 mL/min — ABNORMAL LOW (ref >60)
Glucose, Bld: 117 mg/dL — ABNORMAL HIGH (ref 70–99)
Potassium: 4.8 mmol/L (ref 3.5–5.1)
Sodium: 135 mmol/L (ref 135–145)
Total Bilirubin: 0.5 mg/dL (ref 0.3–1.2)
Total Protein: 5.9 g/dL — ABNORMAL LOW (ref 6.5–8.1)

## 2022-05-27 LAB — CBC WITH DIFFERENTIAL (CANCER CENTER ONLY)
Abs Immature Granulocytes: 0.05 10*3/uL (ref 0.00–0.07)
Basophils Absolute: 0 10*3/uL (ref 0.0–0.1)
Basophils Relative: 1 %
Eosinophils Absolute: 0.1 10*3/uL (ref 0.0–0.5)
Eosinophils Relative: 4 %
HCT: 28.7 % — ABNORMAL LOW (ref 36.0–46.0)
Hemoglobin: 9.6 g/dL — ABNORMAL LOW (ref 12.0–15.0)
Immature Granulocytes: 1 %
Lymphocytes Relative: 22 %
Lymphs Abs: 0.8 10*3/uL (ref 0.7–4.0)
MCH: 32.3 pg (ref 26.0–34.0)
MCHC: 33.4 g/dL (ref 30.0–36.0)
MCV: 96.6 fL (ref 80.0–100.0)
Monocytes Absolute: 0.1 10*3/uL (ref 0.1–1.0)
Monocytes Relative: 2 %
Neutro Abs: 2.6 10*3/uL (ref 1.7–7.7)
Neutrophils Relative %: 70 %
Platelet Count: 190 10*3/uL (ref 150–400)
RBC: 2.97 MIL/uL — ABNORMAL LOW (ref 3.87–5.11)
RDW: 16.3 % — ABNORMAL HIGH (ref 11.5–15.5)
WBC Count: 3.7 10*3/uL — ABNORMAL LOW (ref 4.0–10.5)
nRBC: 0 % (ref 0.0–0.2)

## 2022-05-27 MED ORDER — OXYCODONE HCL 5 MG PO TABS
5.0000 mg | ORAL_TABLET | ORAL | 0 refills | Status: DC | PRN
  Filled 2022-05-27: qty 30, 5d supply, fill #0

## 2022-05-27 MED ORDER — SODIUM CHLORIDE 0.9% FLUSH
10.0000 mL | Freq: Once | INTRAVENOUS | Status: AC
  Administered 2022-05-27: 10 mL

## 2022-05-27 MED ORDER — HEPARIN SOD (PORK) LOCK FLUSH 100 UNIT/ML IV SOLN
500.0000 [IU] | Freq: Once | INTRAVENOUS | Status: AC | PRN
  Administered 2022-05-27: 500 [IU]

## 2022-05-27 MED ORDER — SODIUM CHLORIDE 0.9 % IV SOLN: Freq: Once | INTRAVENOUS | Status: AC

## 2022-05-27 MED ORDER — SODIUM CHLORIDE 0.9% FLUSH
10.0000 mL | Freq: Once | INTRAVENOUS | Status: AC | PRN
  Administered 2022-05-27: 10 mL

## 2022-05-27 NOTE — Telephone Encounter (Signed)
Called Pt and discussed lab, MD, and IVF appts scheduled for today. Pt verbalized understanding.

## 2022-05-27 NOTE — Progress Notes (Signed)
[facility] OFFICE PROGRESS NOTE  Patient Care Team: Mayra Neer, MD as PCP - General (Family Medicine) Heath Lark, MD as Consulting Physician (Hematology and Oncology) Everitt Amber, MD as Consulting Physician (Obstetrics and Gynecology)  ASSESSMENT & PLAN:  Adenocarcinoma of right fallopian tube [facility]) Treatment course was complicated by mucositis and dehydration She is scheduled for CT imaging next week for further follow-up She will continue aggressive supportive care in the meantime  Mucositis due to antineoplastic therapy She has significant mucositis on exam Continue conservative approach I recommend low-dose pain medicine.  We discussed potential side effects with nausea and constipation I recommend IV fluid support daily due to dehydration I plan reduced dose chemotherapy for future cycles  CKD (chronic kidney disease), stage III ([place]) She is clinically dehydrated and her serum creatinine is elevated I recommend daily IV fluids and she agreed  Pancytopenia, acquired ([place]) She has mild pancytopenia due to treatment She does not need transfusion support Observe closely  No orders of the defined types were placed in this encounter.   All questions were answered. The patient knows to call the clinic with any problems, questions or concerns. The total time spent in the appointment was 30 minutes encounter with patients including review of chart and various tests results, discussions about plan of care and coordination of care plan   Heath Lark, MD 05/27/2022 2:53 PM  INTERVAL HISTORY: Please see below for problem oriented charting. she returns for urgent evaluation She complains of significant mucositis pain for the past few days Magic mouthwash and Diflucan was not helpful She has trouble eating and drinking  REVIEW OF SYSTEMS:   Constitutional: Denies fevers, chills or abnormal weight loss Eyes: Denies blurriness of vision Respiratory: Denies  cough, dyspnea or wheezes Cardiovascular: Denies palpitation, chest discomfort or lower extremity swelling Gastrointestinal:  Denies nausea, heartburn or change in bowel habits Skin: Denies abnormal skin rashes Lymphatics: Denies new lymphadenopathy or easy bruising Neurological:Denies numbness, tingling or new weaknesses Behavioral/Psych: Mood is stable, no new changes  All other systems were reviewed with the patient and are negative.  I have reviewed the past medical history, past surgical history, social history and family history with the patient and they are unchanged from previous note.  ALLERGIES:  is allergic to dilaudid [hydromorphone hcl], morphine and related, and sudafed [pseudoephedrine hcl].  MEDICATIONS:  Current Outpatient Medications  Medication Sig Dispense Refill   oxyCODONE (OXY IR/ROXICODONE) 5 MG immediate release tablet Take 1 tablet (5 mg total) by mouth every 4 (four) hours as needed for severe pain. 30 tablet 0   acetaminophen (TYLENOL) 325 MG tablet Take 650 mg by mouth every 6 (six) hours as needed for headache (pain).     acetaminophen (TYLENOL) 500 MG tablet Take 2 tablets (1,000 mg total) by mouth every 6 (six) hours as needed for mild pain.     atenolol (TENORMIN) 25 MG tablet TAKE 1 TABLET BY MOUTH EVERY DAY 30 tablet 2   cloNIDine (CATAPRES) 0.1 MG tablet TAKE 1 TABLET BY MOUTH EVERY DAY (Patient not taking: Reported on 05/20/2022) 30 tablet 1   dexamethasone (DECADRON) 4 MG tablet Take 2 tabs at the night before and 2 tab the morning of chemotherapy, every 3 weeks, by mouth x 6 cycles 36 tablet 6   doxazosin (CARDURA) 4 MG tablet TAKE 1 TABLET BY MOUTH EVERY DAY (Patient not taking: Reported on 05/20/2022) 30 tablet 1   fluconazole (DIFLUCAN) 100 MG tablet Take 1 tablet (100 mg total)  by mouth daily. 7 tablet 0   hydrochlorothiazide (HYDRODIURIL) 25 MG tablet TAKE 1 TABLET (25 MG TOTAL) BY MOUTH DAILY. (Patient not taking: Reported on 05/20/2022) 30 tablet 3    lidocaine-prilocaine (EMLA) cream Apply to affected area once daily as directed. (Patient taking differently: 1 application  as needed (access port). Apply to affected area once daily as directed.) 30 g 3   lisinopril (ZESTRIL) 20 MG tablet TAKE 1 TABLET BY MOUTH EVERY DAY IN THE EVENING (Patient taking differently: Take 20 mg by mouth every evening. Restarted 05/23/22) 60 tablet 0   magic mouthwash (nystatin, lidocaine, diphenhydrAMINE, alum & mag hydroxide) suspension Swish and spit 5 mls by mouth 4 times a day 240 mL 0   magic mouthwash w/lidocaine SOLN Take 5 mLs by mouth 4 (four) times daily. Swish and spit. 240 mL 0   ondansetron (ZOFRAN) 8 MG tablet Take 1 tablet (8 mg total) by mouth every 8 (eight) hours as needed for nausea or vomiting. Start on the third day after chemotherapy. 30 tablet 1   prochlorperazine (COMPAZINE) 10 MG tablet Take 1 tablet (10 mg total) by mouth every 6 (six) hours as needed for nausea or vomiting. 30 tablet 1   venlafaxine XR (EFFEXOR-XR) 37.5 MG 24 hr capsule TAKE 1 CAPSULE BY MOUTH DAILY WITH BREAKFAST 90 capsule 3   No current facility-administered medications for this visit.   Facility-Administered Medications Ordered in Other Visits  Medication Dose Route Frequency Provider Last Rate Last Admin   heparin lock flush 100 unit/mL  500 Units Intracatheter Once PRN Alvy Bimler, Mliss Wedin, MD       sodium chloride flush (NS) 0.9 % injection 10 mL  10 mL Intravenous PRN Alvy Bimler, Pepe Mineau, MD   10 mL at 02/11/17 0839   sodium chloride flush (NS) 0.9 % injection 10 mL  10 mL Intravenous PRN Alvy Bimler, Elishua Radford, MD   10 mL at 03/04/17 1510   sodium chloride flush (NS) 0.9 % injection 10 mL  10 mL Intracatheter Once PRN Heath Lark, MD        SUMMARY OF ONCOLOGIC HISTORY: Oncology History Overview Note  Neg genetics High grade serous    Adenocarcinoma of right fallopian tube ([place])  08/12/2016 Initial Diagnosis   The patient has many months of vague symptoms of fullness in the pelvis,  urinary frequency and difficulty with defecation. She denies vaginal bleeding or rectal bleeding.     08/12/2016 Imaging   Abdomen X-ray in the ER: Moderate colonic stool burden without evidence of enteric obstruction   11/13/2016 Imaging   TVUS was performed on 11/13/16 which showed a large hypoechoic lobular mass seen posterior to the uterus measuring 18.2x16.1x11.8cm, blood flow was seen along the periphery of the mass. It was considered to be likely to be a fibroid vs pelvic mass vs ovarian mass. Neither ovary was removed. Moderate amount of free fluid in the pelvis. THe uterus measures 4x10x4cm. The endometrium is 23m.    12/05/2016 Imaging   MR pelvis 1. Mild limitations as detailed above. 2. Heterogeneous pelvic mass is favored to arise from the right ovary. Favor a solid ovarian neoplasm such as fibroma/Brenner's tumor. Given lesion size and heterogeneous T2 signal out, complicating torsion cannot be excluded. 3. Moderate abdominopelvic ascites, without specific evidence of peritoneal metastasis. Consider further evaluation with contrast-enhanced abdominopelvic CT.    12/26/2016 Pathology Results   1. Uterus, ovaries and fallopian tubes - ADENOCARCINOMA INVOLVING RIGHT FALLOPIAN TUBE, RIGHT AND LEFT OVARIES, UTERINE SEROSA AND PELVIC MASS. -  SEE ONCOLOGY TABLE AND COMMENT. - UTERINE CERVIX, ENDOMETRIUM, MYOMETRIUM AND LEFT FALLOPIAN TUBE FREE OF TUMOR. 2. Omentum, resection for tumor - ADENOCARCINOMA. Microscopic Comment 1. ONCOLOGY TABLE - FALLOPIAN TUBE. SEE COMMENT 1. Specimen, including laterality: Uterus, bilateral adnexa, pelvic mass and omentum. 2. Procedure: Hysterectomy with bilateral salpingo-oophorectomy, pelvic mass excision and omentectomy. 3. Lymph node sampling performed: No 4. Tumor site: See comment. 5. Tumor location in fallopian tube: Right fallopian tube fimbria 6. Specimen integrity (intact/ruptured/disrupted): Intact 7. Tumor size (cm): 18 cm, see comment. 8.  Histologic type: Adenocarcinoma 9. Grade: High grade 10. Microscopic tumor extension: Tumor involves right fallopian tube, right and left ovaries, uterine serosa, pelvic mass and omentum. 11. Margins: See comment. 12. Lymph-Vascular invasion: Present 13. Lymph nodes: # examined: 0; # positive: N/A 14. TNM: pT3c, pNX 15. FIGO Stage (based on pathologic findings, needs clinical correlation: III-C 16. Comments: There is an 18 cm pelvic mass which is a high grade adenocarcinoma and there is a 12.2 cm  segment of omentum which is extensively involved with adenocarcinoma. The tumor also involves the fimbria of the right fallopian tube and the parenchyma of the right and left ovaries as well as uterine serosa. The fimbria of the right fallopian has intraepithelial atypia consistent with a precursor lesion and therefore this is most consistent with primary fallopian tube adenocarcinoma. A primary peritoneal serous carcinoma is a less likely possibility.    12/26/2016 Pathology Results   PERITONEAL/ASCITIC FLUID(SPECIMEN 1 OF 1 COLLECTED 12/26/16): POORLY DIFFERENTIATED ADENOCARCINOMA   12/26/2016 Surgery   Procedure(s) Performed: Exploratory laparotomy with total abdominal hysterectomy, bilateral salpingo-oophorectomy, omentectomy radical tumor debulking for ovarian cancer .   Surgeon: Thereasa Solo, MD.      Operative Findings: 20cm left ovarian mass densely adherent to the posterior uterus, right tube and ovary, cervix, sigmoid colon. 10cm omental cake. 4L ascites. 62m size tumor nodules on the serosa of the terminal ileum and proximal sigmoid colon. 192mnodules on right diaphragm.    This represented an optimal cytoreduction (R1) with [id number] on intestine and diaphragm representing gross visible disease   01/16/2017 Procedure   Placement of single lumen port a cath via right internal jugular vein. The catheter tip lies at the cavo-atrial junction. A power injectable port a cath was placed and is ready  for immediate use.   01/17/2017 Imaging   1. 3.5 x 7.2 x 4.6 cm fluid collection along the vaginal cuff, posterior the bladder and extending into the right adnexal space. This lesion demonstrates rim enhancement. Imaging features could be related to a loculated postoperative seroma or hematoma. Superinfection cannot be excluded by CT. Given debulking surgery was 3 weeks ago, this entire structure is un likely to represent neoplasm, but peritoneal involvement could have this appearance. 2. Small fluid collection in the left para colic gutter without rim enhancement. 3. Irregular/nodular appearance of the peritoneal M in the anatomic pelvis with areas of subtle nodularity between the stomach and the spleen. Close attention in these regions on follow-up recommended as metastatic disease is a concern. 4. Mildly enlarged hepatoduodenal ligament lymph node. Attention on follow-up recommended.   01/20/2017 Tumor Marker   Patient's tumor was tested for the following markers: CA-125 Results of the tumor marker test revealed 46.6   01/28/2017 Genetic Testing       Negative genetic testing on the [facility].  The [facility] panel offered by [employer] sequencing and deletion/duplication testing of the following 28 genes: APC, ATM,  BARD1, BMPR1A, BRCA1, BRCA2, BRIP1, CHD1, CDK4, CDKN2A, CHEK2, EPCAM (large rearrangement only), MLH1, MSH2, MSH6, MUTYH, NBN, PALB2, PMS2, PTEN, RAD51C, RAD51D, SMAD4, STK11, and TP53. Sequencing was performed for select regions of POLE and POLD1, and large rearrangement analysis was performed for select regions of GREM1. The report date is January 28, 2017.  HRD testing looking for genomic instability and BRCA mutations was negative.  The report date of this test is January 27, 2017.    01/31/2017 Tumor Marker   Patient's tumor was tested for the following markers: CA-125 Results of the tumor marker test revealed 22.4   03/04/2017 Tumor Marker    Patient's tumor was tested for the following markers: CA-125 Results of the tumor marker test revealed 13.8   04/18/2017 Tumor Marker   Patient's tumor was tested for the following markers: CA-125 Results of the tumor marker test revealed 11.9   05/05/2017 Tumor Marker   Patient's tumor was tested for the following markers: CA-125 Results of the tumor marker test revealed 12   05/26/2017 Tumor Marker   Patient's tumor was tested for the following markers: CA-125 Results of the tumor marker test revealed 10.6   05/26/2017 Imaging   1. A previously noted postoperative fluid collection in the low pelvis and residual ascites seen on the prior examination has resolved on today's study. No findings to suggest residual/recurrent disease on today's examination. No definite solid organ metastasis identified in the abdomen or pelvis. No lymphadenopathy. 2. Aortic atherosclerosis.   06/10/2017 Procedure   Successful right IJ vein Port-A-Cath explant.   03/09/2018 Tumor Marker   Patient's tumor was tested for the following markers: CA-125 Results of the tumor marker test revealed 12.1   05/26/2018 Tumor Marker   Patient's tumor was tested for the following markers: CA-125 Results of the tumor marker test revealed 13.8   09/09/2018 Tumor Marker   Patient's tumor was tested for the following markers: CA-125 Results of the tumor marker test revealed 23.9   12/18/2018 Tumor Marker   Patient's tumor was tested for the following markers: CA-125 Results of the tumor marker test revealed 43.8   02/27/2019 - 02/28/2019 Hospital Admission   She was admitted for bowel obstruction   02/27/2019 Imaging   1. High-grade small bowel obstruction with transition point in the pelvis in an area of suspected desmoplastic reaction surrounding a 1.3 cm spiculated nodule in the mesentery, suspicious for carcinoid. There is hyperenhancement near the tip of the appendix and loss of the normal fat plane between it and the  adjacent sigmoid colon, potentially the site of primary lesion. 2. Multiple new small subcentimeter hyperdense lesions scattered along the liver capsule, concerning for metastases. Enlarged hyperenhancing gastrohepatic and portacaval lymph nodes concerning for nodal metastases. 3. Very mild right hydroureteronephrosis to the level of the desmoplastic reaction in the pelvis, potentially involving the right ureter. 4. Infraumbilical ventral abdominal wall diastasis containing a small nondilated portion of transverse colon. 5. Trace ascites. 6. Cholelithiasis. 7.  Aortic atherosclerosis (ICD10-I70.0).     03/05/2019 Tumor Marker   Patient's tumor was tested for the following markers: CA-125 Results of the tumor marker test revealed 70.1   03/12/2019 Echocardiogram   IMPRESSIONS     1. Left ventricular ejection fraction, by visual estimation, is 60 to 65%. The left ventricle has normal function. There is no left ventricular hypertrophy.  2. Left ventricular diastolic parameters are consistent with Grade I diastolic dysfunction (impaired relaxation).  3. Global right ventricle has normal systolic function.The right  ventricular size is normal. No increase in right ventricular wall thickness.  4. Left atrial size was normal.  5. Right atrial size was normal.  6. The pericardium was not assessed.  7. The mitral valve is normal in structure. No evidence of mitral valve regurgitation.  8. The tricuspid valve is normal in structure. Tricuspid valve regurgitation is trivial.  9. The aortic valve is normal in structure. Aortic valve regurgitation is not visualized. 10. The pulmonic valve was grossly normal. Pulmonic valve regurgitation is not visualized. 11. The average left ventricular global longitudinal strain is -17.6 %.   03/16/2019 Procedure   Successful placement of a right internal jugular approach power injectable Port-A-Cath. The catheter is ready for immediate use.     04/19/2019 Tumor  Marker   Patient's tumor was tested for the following markers: CA-125 Results of the tumor marker test revealed 39.8   05/17/2019 Tumor Marker   Patient's tumor was tested for the following markers: CA-125 Results of the tumor marker test revealed 27   05/28/2019 Tumor Marker   Patient's tumor was tested for the following markers: CA-125 Results of the tumor marker test revealed 27.7.   06/11/2019 Imaging   1. No new or progressive metastatic disease in the abdomen or pelvis. 2. Tiny noncalcified perisplenic implant is decreased. Calcified pericardiophrenic, retroperitoneal and bilateral inguinal lymph nodes and scattered small calcified perihepatic and perisplenic implants are stable. 3.  Aortic Atherosclerosis (ICD10-I70.0).       06/17/2019 Echocardiogram    1. Left ventricular ejection fraction, by estimation, is 65 to 70%. The left ventricle has normal function. The left ventricle has no regional wall motion abnormalities. Left ventricular diastolic parameters were normal.  2. Right ventricular systolic function is normal. The right ventricular size is normal.  3. The mitral valve is normal in structure and function. No evidence of mitral valve regurgitation. No evidence of mitral stenosis.  4. The aortic valve is normal in structure and function. Aortic valve regurgitation is not visualized. No aortic stenosis is present.   06/21/2019 Tumor Marker   Patient's tumor was tested for the following markers: CA-125 Results of the tumor marker test revealed 18.7   07/19/2019 Tumor Marker   Patient's tumor was tested for the following markers: CA-125 Results of the tumor marker test revealed 16.7   08/30/2019 Tumor Marker   Patient's tumor was tested for the following markers: CA-125. Results of the tumor marker test revealed 13.9   09/15/2019 Echocardiogram    1. Left ventricular ejection fraction, by estimation, is 65 to 70%. The left ventricle has normal function. The left ventricle has  no regional wall motion abnormalities. Left ventricular diastolic parameters are consistent with Grade I diastolic dysfunction (impaired relaxation). The average left ventricular global longitudinal strain is 18.1 %. The global longitudinal strain is normal.  2. Right ventricular systolic function is normal. The right ventricular size is normal.  3. The mitral valve is normal in structure. No evidence of mitral valve regurgitation. No evidence of mitral stenosis.  4. The aortic valve is normal in structure. Aortic valve regurgitation is not visualized. No aortic stenosis is present.  5. The inferior vena cava is normal in size with greater than 50% respiratory variability, suggesting right atrial pressure of 3 mmHg.     09/24/2019 Imaging   1. Stable exam. No new or progressive metastatic disease on today's study. 2. Small subcapsular hypodensity in the lateral spleen, new in the interval, but indeterminate. Attention on follow-up recommended. 3. The  calcified upper abdominal and groin lymph nodes are stable as are the scattered calcified and noncalcified peritoneal implants. 4. Stable midline ventral hernia containing a short segment of colon without complicating features. 5. Aortic Atherosclerosis (ICD10-I70.0).     10/04/2019 - 09/21/2021 Chemotherapy   Patient is on Treatment Plan : ovarian Bevacizumab q21d     10/04/2019 Tumor Marker   Patient's tumor was tested for the following markers: CA-125. Results of the tumor marker test revealed 12.9.   11/01/2019 Tumor Marker   Patient's tumor was tested for the following markers: CA-125 Results of the tumor marker test revealed 15.4   12/13/2019 Tumor Marker   Patient's tumor was tested for the following markers: CA-125 Results of the tumor marker test revealed 14.8   12/31/2019 Imaging   1. Unchanged tiny peritoneal nodule adjacent to the spleen measuring 6 mm. Other previously noted peritoneal nodules are imperceptible on present  examination. 2. Unchanged prominent, partially calcified portacaval lymph node and bilateral inguinal lymph nodes. 3. Unchanged subtle, nonspecific subcapsular lesion of the spleen.  4. No evidence of new metastatic disease in the abdomen or pelvis. 5. Status post hysterectomy. 6. Unchanged low midline ventral abdominal hernia containing a single loop of nonobstructed transverse colon. 7. Cholelithiasis. 8. Aortic Atherosclerosis (ICD10-I70.0).       01/03/2020 Tumor Marker   Patient's tumor was tested for the following markers: CA-125 Results of the tumor marker test revealed 11.7.   01/24/2020 Tumor Marker   Patient's tumor was tested for the following markers: CA-125. Results of the tumor marker test revealed 15.1   03/06/2020 Tumor Marker   Patient's tumor was tested for the following markers: CA-125. Results of the tumor marker test revealed 20.3   03/27/2020 Tumor Marker   Patient's tumor was tested for the following markers: CA-125 Results of the tumor marker test revealed 23.7   05/11/2020 Tumor Marker   Patient's tumor was tested for the following markers: CA-125. Results of the tumor marker test revealed 25.4   06/23/2020 Imaging   1. Cholelithiasis with gallbladder wall thickening and mild infiltrative edema in the porta hepatis, raising the possibility of acute cholecystitis. There is truncation of the common bile duct distally and distal choledocholithiasis is difficult to exclude. Correlate with bilirubin levels. If clinically warranted, MRCP could be utilized for further characterization. 2. Stable tiny perisplenic nodule. 3. Other imaging findings of potential clinical significance: Small type 1 hiatal hernia. Prominent stool throughout the colon favors constipation. Ventral infraumbilical hernia contains transverse colon without findings of strangulation or obstruction. Small supraumbilical hernia contains adipose tissue. Lumbar degenerative disc disease at L5-S1. Stable  tiny nodule along the lateral margin of the spleen, nonspecific. 4. Aortic atherosclerosis.     12/15/2020 Imaging   No evidence of recurrent or metastatic carcinoma within the abdomen or pelvis.   Stable ventral hernia containing transverse colon. No evidence of bowel obstruction or strangulation.   Cholelithiasis. No radiographic evidence of cholecystitis.   Tiny hiatal hernia.   Aortic Atherosclerosis (ICD10-I70.0).     06/11/2021 Imaging   1. Numerous sub 4 mm pulmonary nodules in the lungs some of which have a cavitary appearance. I think it is unlikely this is metastatic disease and more likely a possible immunotherapy related process such as a sarcoid like reaction. Recommend full chest CT further evaluation. 2. Stable small calcified retroperitoneal and mesenteric lymph nodes. No findings suspicious for recurrent peritoneal carcinomatosis. 3. Stable lower abdominal wall hernia containing part of the transverse colon. 4. Cholelithiasis.  09/27/2021 Imaging   1. Numerous small pulmonary nodules throughout the lungs, the majority of which are cavitary. These are all subcentimeter, however nodules that were seen in the bilateral lung bases on prior CT dated 06/08/2021 appear slightly increased in size and number. Behavior is highly suspicious for pulmonary metastatic disease despite the unusual appearance, however as previously reported general differential considerations include atypical infection and inflammation, such as drug-induced sarcoidosis like reaction.  2. Unchanged calcified lymph nodes in the abdomen and pelvis, consistent with treated nodal metastases. 3. Cholelithiasis with wall thickening and pericholecystic fluid. This appearance is similar to prior examination and suggests chronic cholecystitis. Correlate for clinical symptoms. Mild intrahepatic biliary ductal dilatation, similar to prior examination. 4. Midline ventral hernia containing a single nonobstructed loop of  mid transverse colon.     12/25/2021 Imaging   1. Again noted are multiple thin walled cavitary lung nodules throughout both lungs. These are similar in size and multiplicity when compared with the exam from 09/26/2021. Etiology remains indeterminate, although metastatic disease cannot be excluded with a high degree of certainty. 2. Stable calcified upper abdominal lymph nodes consistent with treated nodal metastases. 3. Gallstones. 4. Aortic Atherosclerosis (ICD10-I70.0).   01/30/2022 Tumor Marker   Patient's tumor was tested for the following markers: CA-125. Results of the tumor marker test revealed 59.3.   01/31/2022 Imaging   1. No substantial interval change in exam. 2. Innumerable tiny cavitary pulmonary nodules in the lung bases. As noted previously, metastatic disease would be distinct consideration although imaging features are nonspecific and infectious/inflammatory etiology is not excluded. 3. Calcified nodal disease in the upper abdomen and pelvis shows no substantial interval change. Scattered noncalcified nodules in the mesentery are similar to prior. 4. Markedly large stool volume throughout the colon. Imaging features suggestive of clinical constipation. 5. Cholelithiasis with ill-defined appearance of the gallbladder wall. Appearance is stable in the interval. 6. Trace free fluid in the cul-de-sac. 7. Midline ventral hernia contains a short segment of transverse colon without complicating features. 8. Aortic Atherosclerosis (ICD10-I70.0).   03/07/2022 Surgery   Procedure Date:  03/07/2022  Pre-operative Diagnosis:  Symptomatic cholelithiasis   Post-operative Diagnosis: Symptomatic cholelithiasis, peritoneal implants with history of right fallopian tube cancer.   Procedure:   Robotic assisted cholecystectomy with ICG FireFly cholangiogram Robotic assisted abdominal wall peritoneal implant excisional biopsy x 3.   Surgeon:  Melvyn Neth, MD  Specimens:    Gallbladder Peritoneal implants x 3    Indications for Procedure:  This is a 67 y.o. female who presents with abdominal pain and workup revealing symptomatic cholelithiasis.  The benefits, complications, treatment options, and expected outcomes were discussed with the patient. The risks of bleeding, infection, recurrence of symptoms, failure to resolve symptoms, bile duct damage, bile duct leak, retained common bile duct stone, bowel injury, and need for further procedures were all discussed with the patient and she was willing to proceed.     03/07/2022 Pathology Results   SURGICAL PATHOLOGY  CASE: [id number]  PATIENT: Holly [name]  Surgical Pathology Report   Specimen Submitted:  A. Gallbladder  B. Abdominal wall   Clinical History: Symptomatic cholelithiasis   DIAGNOSIS:  A. GALLBLADDER; CHOLECYSTECTOMY:  - CHRONIC CHOLECYSTITIS WITH CHOLELITHIASIS.  - NEGATIVE FOR DYSPLASIA AND MALIGNANCY.  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   B. SOFT TISSUE, ABDOMINAL WALL; BIOPSY:  - POSITIVE FOR MALIGNANCY.  - METASTATIC CARCINOMA, COMPATIBLE WITH HIGH-GRADE SEROUS CARCINOMA OF GYNECOLOGIC  PRIMARY.   Comment:  Immunohistochemical studies were performed on neoplastic cells in both sources. Tumor cells in both are positive for CK7, Pax-8, and WT-1, supporting the above diagnosis. P53 demonstrates strong and diffuse expression, indicative of mutated expression pattern.   There is sufficient tissue present for ancillary molecular testing if desired.    04/04/2022 Imaging   1. Mild progression of partially calcified peritoneal metastatic implants surrounding the liver, in the porta hepatis and in the periumbilical region. No generalized ascites or peritoneal nodularity status post omentectomy. 2. Grossly stable cavitary lesions at both lung bases, presumably reflecting metastatic disease. 3. No other  evidence of progressive metastatic disease within the abdomen or pelvis. 4. Enlarging ventral hernia containing a portion of the transverse colon. No evidence of bowel obstruction or inflammation. 5.  Aortic Atherosclerosis (ICD10-I70.0).   04/08/2022 -  Chemotherapy   Patient is on Treatment Plan : OVARIAN Carboplatin + Paclitaxel + Bevacizumab q21d      04/09/2022 Tumor Marker   Patient's tumor was tested for the following markers: CA-125. Results of the tumor marker test revealed 86.7.   05/22/2022 Tumor Marker   Patient's tumor was tested for the following markers: CA-125. Results of the tumor marker test revealed 49.7.     PHYSICAL EXAMINATION: ECOG PERFORMANCE STATUS: 2 - Symptomatic, <50% confined to bed  Vitals:   05/27/22 1329  BP: (!) 159/75  Pulse: 68  Resp: 18  SpO2: 100%   Filed Weights   05/27/22 1329  Weight: 144 lb 9.6 oz (65.6 kg)    GENERAL:alert, no distress and comfortable SKIN: skin color, texture, turgor are normal, no rashes or significant lesions EYES: normal, Conjunctiva are pink and non-injected, sclera clear OROPHARYNX: Noted diffuse mucositis.  No thrush.  No active bleeding NECK: supple, thyroid normal size, non-tender, without nodularity LYMPH:  no palpable lymphadenopathy in the cervical, axillary or inguinal LUNGS: clear to auscultation and percussion with normal breathing effort HEART: regular rate & rhythm and no murmurs and no lower extremity edema ABDOMEN:abdomen soft, non-tender and normal bowel sounds Musculoskeletal:no cyanosis of digits and no clubbing  NEURO: alert & oriented x 3 with fluent speech, no focal motor/sensory deficits  LABORATORY DATA:  I have reviewed the data as listed    Component Value Date/Time   NA 135 05/27/2022 1228   NA 139 04/14/2017 0742   K 4.8 05/27/2022 1228   K 4.0 04/14/2017 0742   CL 109 05/27/2022 1228   CO2 19 (L) 05/27/2022 1228   CO2 21 (L) 04/14/2017 0742   GLUCOSE 117 (H) 05/27/2022 1228    GLUCOSE 247 (H) 04/14/2017 0742   BUN 33 (H) 05/27/2022 1228   BUN 13.1 04/14/2017 0742   CREATININE 1.38 (H) 05/27/2022 1228   CREATININE 0.9 04/14/2017 0742   CALCIUM 8.9 05/27/2022 1228   CALCIUM 9.2 04/14/2017 0742   PROT 5.9 (L) 05/27/2022 1228   PROT 7.3 04/14/2017 0742   ALBUMIN 3.3 (L) 05/27/2022 1228   ALBUMIN 3.9 04/14/2017 0742   AST 15 05/27/2022 1228   AST 17 04/14/2017 0742   ALT 10 05/27/2022 1228   ALT 14 04/14/2017 0742   ALKPHOS 50 05/27/2022 1228   ALKPHOS 70 04/14/2017 0742   BILITOT 0.5 05/27/2022 1228   BILITOT 0.37 04/14/2017 0742   GFRNONAA 42 (L) 05/27/2022 1228   GFRAA >60 01/24/2020 0924   GFRAA >60 07/19/2019 0951    No results found for: "SPEP", "UPEP"  Lab Results  Component Value Date   WBC  3.7 (L) 05/27/2022   NEUTROABS 2.6 05/27/2022   HGB 9.6 (L) 05/27/2022   HCT 28.7 (L) 05/27/2022   MCV 96.6 05/27/2022   PLT 190 05/27/2022      Chemistry      Component Value Date/Time   NA 135 05/27/2022 1228   NA 139 04/14/2017 0742   K 4.8 05/27/2022 1228   K 4.0 04/14/2017 0742   CL 109 05/27/2022 1228   CO2 19 (L) 05/27/2022 1228   CO2 21 (L) 04/14/2017 0742   BUN 33 (H) 05/27/2022 1228   BUN 13.1 04/14/2017 0742   CREATININE 1.38 (H) 05/27/2022 1228   CREATININE 0.9 04/14/2017 0742      Component Value Date/Time   CALCIUM 8.9 05/27/2022 1228   CALCIUM 9.2 04/14/2017 0742   ALKPHOS 50 05/27/2022 1228   ALKPHOS 70 04/14/2017 0742   AST 15 05/27/2022 1228   AST 17 04/14/2017 0742   ALT 10 05/27/2022 1228   ALT 14 04/14/2017 0742   BILITOT 0.5 05/27/2022 1228   BILITOT 0.37 04/14/2017 0742

## 2022-05-27 NOTE — Assessment & Plan Note (Signed)
Treatment course was complicated by mucositis and dehydration She is scheduled for CT imaging next week for further follow-up She will continue aggressive supportive care in the meantime

## 2022-05-27 NOTE — Assessment & Plan Note (Addendum)
She has significant mucositis on exam Continue conservative approach I recommend low-dose pain medicine.  We discussed potential side effects with nausea and constipation I recommend IV fluid support daily due to dehydration I plan reduced dose chemotherapy for future cycles

## 2022-05-27 NOTE — Assessment & Plan Note (Signed)
She is clinically dehydrated and her serum creatinine is elevated I recommend daily IV fluids and she agreed

## 2022-05-27 NOTE — Telephone Encounter (Signed)
Returned Pt's call regarding mouth sores. Pt states pain has worsened despite magic mouth wash, baking soda rinse, and salt water rinse. Pt continues Diflucan as ordered on 2/1 for thrush. Pt reports she has been unable to eat/drink for 5 days d/t pain. Pt denies fatigue and states she is urinating throughout day. Pt asking for MD input on what else to try.

## 2022-05-27 NOTE — Telephone Encounter (Signed)
I suggest a visit today: labs, see me and IVF if she agrees

## 2022-05-27 NOTE — Patient Instructions (Signed)
Dehydration, Adult Dehydration is a condition in which there is not enough water or other fluids in the body. This happens when a person loses more fluids than he or she takes in. Important organs, such as the kidneys, brain, and heart, cannot function without a proper amount of fluids. Any loss of fluids from the body can lead to dehydration. Dehydration can be mild, moderate, or severe. It should be treated right away to prevent it from becoming severe. What are the causes? Dehydration may be caused by: Conditions that cause loss of water or other fluids, such as diarrhea, vomiting, or sweating or urinating a lot. Not drinking enough fluids, especially when you are ill or doing activities that require a lot of energy. Other illnesses and conditions, such as fever or infection. Certain medicines, such as medicines that remove excess fluid from the body (diuretics). Lack of safe drinking water. Not being able to get enough water and food. What increases the risk? The following factors may make you more likely to develop this condition: Having a long-term (chronic) illness that has not been treated properly, such as diabetes, heart disease, or kidney disease. Being 65 years of age or older. Having a disability. Living in a place that is high in altitude, where thinner, drier air causes more fluid loss. Doing exercises that put stress on your body for a long time (endurance sports). What are the signs or symptoms? Symptoms of dehydration depend on how severe it is. Mild or moderate dehydration Thirst. Dry lips or dry mouth. Dizziness or light-headedness, especially when standing up from a seated position. Muscle cramps. Dark urine. Urine may be the color of tea. Less urine or tears produced than usual. Headache. Severe dehydration Changes in skin. Your skin may be cold and clammy, blotchy, or pale. Your skin also may not return to normal after being lightly pinched and released. Little or  no tears, urine, or sweat. Changes in vital signs, such as rapid breathing and low blood pressure. Your pulse may be weak or may be faster than 100 beats a minute when you are sitting still. Other changes, such as: Feeling very thirsty. Sunken eyes. Cold hands and feet. Confusion. Being very tired (lethargic) or having trouble waking from sleep. Short-term weight loss. Loss of consciousness. How is this diagnosed? This condition is diagnosed based on your symptoms and a physical exam. You may have blood and urine tests to help confirm the diagnosis. How is this treated? Treatment for this condition depends on how severe it is. Treatment should be started right away. Do not wait until dehydration becomes severe. Severe dehydration is an emergency and needs to be treated in a hospital. Mild or moderate dehydration can be treated at home. You may be asked to: Drink more fluids. Drink an oral rehydration solution (ORS). This drink helps restore proper amounts of fluids and salts and minerals in the blood (electrolytes). Severe dehydration can be treated: With IV fluids. By correcting abnormal levels of electrolytes. This is often done by giving electrolytes through a tube that is passed through your nose and into your stomach (nasogastric tube, or NG tube). By treating the underlying cause of dehydration. Follow these instructions at home: Oral rehydration solution If told by your health care provider, drink an ORS: Make an ORS by following instructions on the package. Start by drinking small amounts, about  cup (120 mL) every 5-10 minutes. Slowly increase how much you drink until you have taken the amount recommended by your health   care provider. Eating and drinking        Drink enough clear fluid to keep your urine pale yellow. If you were told to drink an ORS, finish the ORS first and then start slowly drinking other clear fluids. Drink fluids such as: Water. Do not drink only  water. Doing that can lead to hyponatremia, which is having too little salt (sodium) in the body. Water from ice chips you suck on. Fruit juice that you have added water to (diluted fruit juice). Low-calorie sports drinks. Eat foods that contain a healthy balance of electrolytes, such as bananas, oranges, potatoes, tomatoes, and spinach. Do not drink alcohol. Avoid the following: Drinks that contain a lot of sugar. These include high-calorie sports drinks, fruit juice that is not diluted, and soda. Caffeine. Foods that are greasy or contain a lot of fat or sugar. General instructions Take over-the-counter and prescription medicines only as told by your health care provider. Do not take sodium tablets. Doing that can lead to having too much sodium in the body (hypernatremia). Return to your normal activities as told by your health care provider. Ask your health care provider what activities are safe for you. Keep all follow-up visits as told by your health care provider. This is important. Contact a health care provider if: You have muscle cramps, pain, or discomfort, such as: Pain in your abdomen and the pain gets worse or stays in one area (localizes). Stiff neck. You have a rash. You are more irritable than usual. You are sleepier or have a harder time waking than usual. You feel weak or dizzy. You feel very thirsty. Get help right away if you have: Any symptoms of severe dehydration. Symptoms of vomiting, such as: You cannot eat or drink without vomiting. Vomiting gets worse or does not go away. Vomit includes blood or green matter (bile). Symptoms that get worse with treatment. A fever. A severe headache. Problems with urination or bowel movements, such as: Diarrhea that gets worse or does not go away. Blood in your stool (feces). This may cause stool to look black and tarry. Not urinating, or urinating only a small amount of very dark urine, within 6-8 hours. Trouble  breathing. These symptoms may represent a serious problem that is an emergency. Do not wait to see if the symptoms will go away. Get medical help right away. Call your local emergency services (911 in the [place] Do not drive yourself to the hospital. Summary Dehydration is a condition in which there is not enough water or other fluids in the body. This happens when a person loses more fluids than he or she takes in. Treatment for this condition depends on how severe it is. Treatment should be started right away. Do not wait until dehydration becomes severe. Drink enough clear fluid to keep your urine pale yellow. If you were told to drink an oral rehydration solution (ORS), finish the ORS first and then start slowly drinking other clear fluids. Take over-the-counter and prescription medicines only as told by your health care provider. Get help right away if you have any symptoms of severe dehydration. This information is not intended to replace advice given to you by your health care provider. Make sure you discuss any questions you have with your health care provider. Document Revised: 08/15/2021 Document Reviewed: 11/19/2018 Elsevier Patient Education  2023 [employer]  

## 2022-05-27 NOTE — Assessment & Plan Note (Signed)
She has mild pancytopenia due to treatment She does not need transfusion support Observe closely

## 2022-05-28 ENCOUNTER — Inpatient Hospital Stay: Payer: Medicare Other

## 2022-05-28 VITALS — BP 146/69 | HR 62 | Temp 99.1°F | Resp 16

## 2022-05-28 DIAGNOSIS — Z7189 Other specified counseling: Secondary | ICD-10-CM

## 2022-05-28 DIAGNOSIS — C5701 Malignant neoplasm of right fallopian tube: Secondary | ICD-10-CM

## 2022-05-28 DIAGNOSIS — Z5111 Encounter for antineoplastic chemotherapy: Secondary | ICD-10-CM | POA: Diagnosis not present

## 2022-05-28 MED ORDER — SODIUM CHLORIDE 0.9% FLUSH
10.0000 mL | Freq: Once | INTRAVENOUS | Status: AC
  Administered 2022-05-28: 10 mL

## 2022-05-28 MED ORDER — HEPARIN SOD (PORK) LOCK FLUSH 100 UNIT/ML IV SOLN
500.0000 [IU] | Freq: Once | INTRAVENOUS | Status: AC
  Administered 2022-05-28: 500 [IU]

## 2022-05-28 MED ORDER — SODIUM CHLORIDE 0.9 % IV SOLN: Freq: Once | INTRAVENOUS | Status: AC

## 2022-05-29 ENCOUNTER — Other Ambulatory Visit: Payer: Self-pay

## 2022-05-29 ENCOUNTER — Inpatient Hospital Stay: Payer: Medicare Other

## 2022-05-29 VITALS — BP 152/72 | HR 62 | Temp 97.9°F | Resp 16 | Wt 148.3 lb

## 2022-05-29 DIAGNOSIS — Z5111 Encounter for antineoplastic chemotherapy: Secondary | ICD-10-CM | POA: Diagnosis not present

## 2022-05-29 DIAGNOSIS — C5701 Malignant neoplasm of right fallopian tube: Secondary | ICD-10-CM

## 2022-05-29 DIAGNOSIS — Z7189 Other specified counseling: Secondary | ICD-10-CM

## 2022-05-29 MED ORDER — HEPARIN SOD (PORK) LOCK FLUSH 100 UNIT/ML IV SOLN
500.0000 [IU] | Freq: Once | INTRAVENOUS | Status: AC | PRN
  Administered 2022-05-29: 500 [IU]

## 2022-05-29 MED ORDER — SODIUM CHLORIDE 0.9 % IV SOLN: Freq: Once | INTRAVENOUS | Status: AC

## 2022-05-29 MED ORDER — SODIUM CHLORIDE 0.9% FLUSH
10.0000 mL | Freq: Once | INTRAVENOUS | Status: AC | PRN
  Administered 2022-05-29: 10 mL

## 2022-05-29 NOTE — Patient Instructions (Signed)

## 2022-05-30 ENCOUNTER — Inpatient Hospital Stay: Payer: Medicare Other

## 2022-05-30 VITALS — BP 158/55 | HR 68 | Temp 98.3°F | Resp 18

## 2022-05-30 DIAGNOSIS — Z7189 Other specified counseling: Secondary | ICD-10-CM

## 2022-05-30 DIAGNOSIS — C5701 Malignant neoplasm of right fallopian tube: Secondary | ICD-10-CM

## 2022-05-30 DIAGNOSIS — Z5111 Encounter for antineoplastic chemotherapy: Secondary | ICD-10-CM | POA: Diagnosis not present

## 2022-05-30 MED ORDER — SODIUM CHLORIDE 0.9 % IV SOLN: Freq: Once | INTRAVENOUS | Status: AC

## 2022-05-30 MED ORDER — HEPARIN SOD (PORK) LOCK FLUSH 100 UNIT/ML IV SOLN
500.0000 [IU] | Freq: Once | INTRAVENOUS | Status: AC
  Administered 2022-05-30: 500 [IU]

## 2022-05-30 MED ORDER — SODIUM CHLORIDE 0.9% FLUSH
10.0000 mL | Freq: Once | INTRAVENOUS | Status: AC
  Administered 2022-05-30: 10 mL

## 2022-05-30 NOTE — Patient Instructions (Signed)
Dehydration, Adult Dehydration is condition in which there is not enough water or other fluids in the body. This happens when a person loses more fluids than he or she takes in. Important body parts cannot work right without the right amount of fluids. Any loss of fluids from the body can cause dehydration. Dehydration can be mild, worse, or very bad. It should be treated right away to keep it from getting very bad. What are the causes? This condition may be caused by: Conditions that cause loss of water or other fluids, such as: Watery poop (diarrhea). Vomiting. Sweating a lot. Peeing (urinating) a lot. Not drinking enough fluids, especially when you: Are ill. Are doing things that take a lot of energy to do. Other illnesses and conditions, such as fever or infection. Certain medicines, such as medicines that take extra fluid out of the body (diuretics). Lack of safe drinking water. Not being able to get enough water and food. What increases the risk? The following factors may make you more likely to develop this condition: Having a long-term (chronic) illness that has not been treated the right way, such as: Diabetes. Heart disease. Kidney disease. Being 65 years of age or older. Having a disability. Living in a place that is high above the ground or sea (high in altitude). The thinner, dried air causes more fluid loss. Doing exercises that put stress on your body for a long time. What are the signs or symptoms? Symptoms of dehydration depend on how bad it is. Mild or worse dehydration Thirst. Dry lips or dry mouth. Feeling dizzy or light-headed, especially when you stand up from sitting. Muscle cramps. Your body making: Dark pee (urine). Pee may be the color of tea. Less pee than normal. Less tears than normal. Headache. Very bad dehydration Changes in skin. Skin may: Be cold to the touch (clammy). Be blotchy or pale. Not go back to normal right after you lightly pinch  it and let it go. Little or no tears, pee, or sweat. Changes in vital signs, such as: Fast breathing. Low blood pressure. Weak pulse. Pulse that is more than 100 beats a minute when you are sitting still. Other changes, such as: Feeling very thirsty. Eyes that look hollow (sunken). Cold hands and feet. Being mixed up (confused). Being very tired (lethargic) or having trouble waking from sleep. Short-term weight loss. Loss of consciousness. How is this treated? Treatment for this condition depends on how bad it is. Treatment should start right away. Do not wait until your condition gets very bad. Very bad dehydration is an emergency. You will need to go to a hospital. Mild or worse dehydration can be treated at home. You may be asked to: Drink more fluids. Drink an oral rehydration solution (ORS). This drink helps get the right amounts of fluids and salts and minerals in the blood (electrolytes). Very bad dehydration can be treated: With fluids through an IV tube. By getting normal levels of salts and minerals in your blood. This is often done by giving salts and minerals through a tube. The tube is passed through your nose and into your stomach. By treating the root cause. Follow these instructions at home: Oral rehydration solution If told by your doctor, drink an ORS: Make an ORS. Use instructions on the package. Start by drinking small amounts, about  cup (120 mL) every 5-10 minutes. Slowly drink more until you have had the amount that your doctor said to have. Eating and drinking          Drink enough clear fluid to keep your pee pale yellow. If you were told to drink an ORS, finish the ORS first. Then, start slowly drinking other clear fluids. Drink fluids such as: Water. Do not drink only water. Doing that can make the salt (sodium) level in your body get too low. Water from ice chips you suck on. Fruit juice that you have added water to (diluted). Low-calorie sports  drinks. Eat foods that have the right amounts of salts and minerals, such as: Bananas. Oranges. Potatoes. Tomatoes. Spinach. Do not drink alcohol. Avoid: Drinks that have a lot of sugar. These include: High-calorie sports drinks. Fruit juice that you did not add water to. Soda. Caffeine. Foods that are greasy or have a lot of fat or sugar. General instructions Take over-the-counter and prescription medicines only as told by your doctor. Do not take salt tablets. Doing that can make the salt level in your body get too high. Return to your normal activities as told by your doctor. Ask your doctor what activities are safe for you. Keep all follow-up visits as told by your doctor. This is important. Contact a doctor if: You have pain in your belly (abdomen) and the pain: Gets worse. Stays in one place. You have a rash. You have a stiff neck. You get angry or annoyed (irritable) more easily than normal. You are more tired or have a harder time waking than normal. You feel: Weak or dizzy. Very thirsty. Get help right away if you have: Any symptoms of very bad dehydration. Symptoms of vomiting, such as: You cannot eat or drink without vomiting. Your vomiting gets worse or does not go away. Your vomit has blood or green stuff in it. Symptoms that get worse with treatment. A fever. A very bad headache. Problems with peeing or pooping (having a bowel movement), such as: Watery poop that gets worse or does not go away. Blood in your poop (stool). This may cause poop to look black and tarry. Not peeing in 6-8 hours. Peeing only a small amount of very dark pee in 6-8 hours. Trouble breathing. These symptoms may be an emergency. Do not wait to see if the symptoms will go away. Get medical help right away. Call your local emergency services (911 in the [place] Do not drive yourself to the hospital. Summary Dehydration is a condition in which there is not enough water or other fluids  in the body. This happens when a person loses more fluids than he or she takes in. Treatment for this condition depends on how bad it is. Treatment should be started right away. Do not wait until your condition gets very bad. Drink enough clear fluid to keep your pee pale yellow. If you were told to drink an oral rehydration solution (ORS), finish the ORS first. Then, start slowly drinking other clear fluids. Take over-the-counter and prescription medicines only as told by your doctor. Get help right away if you have any symptoms of very bad dehydration. This information is not intended to replace advice given to you by your health care provider. Make sure you discuss any questions you have with your health care provider. Document Revised: 08/15/2021 Document Reviewed: 11/19/2018 [employer] Patient Education  2023 [employer]  

## 2022-05-31 ENCOUNTER — Inpatient Hospital Stay: Payer: Medicare Other

## 2022-05-31 VITALS — BP 154/66 | HR 73 | Temp 98.0°F | Resp 16

## 2022-05-31 DIAGNOSIS — Z5111 Encounter for antineoplastic chemotherapy: Secondary | ICD-10-CM | POA: Diagnosis not present

## 2022-05-31 DIAGNOSIS — Z7189 Other specified counseling: Secondary | ICD-10-CM

## 2022-05-31 DIAGNOSIS — C5701 Malignant neoplasm of right fallopian tube: Secondary | ICD-10-CM

## 2022-05-31 MED ORDER — HEPARIN SOD (PORK) LOCK FLUSH 100 UNIT/ML IV SOLN
500.0000 [IU] | Freq: Once | INTRAVENOUS | Status: AC | PRN
  Administered 2022-05-31: 500 [IU]

## 2022-05-31 MED ORDER — SODIUM CHLORIDE 0.9 % IV SOLN: Freq: Once | INTRAVENOUS | Status: AC

## 2022-05-31 MED ORDER — SODIUM CHLORIDE 0.9% FLUSH
10.0000 mL | Freq: Once | INTRAVENOUS | Status: AC | PRN
  Administered 2022-05-31: 10 mL

## 2022-05-31 NOTE — Patient Instructions (Signed)
Dehydration, Adult Dehydration is condition in which there is not enough water or other fluids in the body. This happens when a person loses more fluids than he or she takes in. Important body parts cannot work right without the right amount of fluids. Any loss of fluids from the body can cause dehydration. Dehydration can be mild, worse, or very bad. It should be treated right away to keep it from getting very bad. What are the causes? This condition may be caused by: Conditions that cause loss of water or other fluids, such as: Watery poop (diarrhea). Vomiting. Sweating a lot. Peeing (urinating) a lot. Not drinking enough fluids, especially when you: Are ill. Are doing things that take a lot of energy to do. Other illnesses and conditions, such as fever or infection. Certain medicines, such as medicines that take extra fluid out of the body (diuretics). Lack of safe drinking water. Not being able to get enough water and food. What increases the risk? The following factors may make you more likely to develop this condition: Having a long-term (chronic) illness that has not been treated the right way, such as: Diabetes. Heart disease. Kidney disease. Being 65 years of age or older. Having a disability. Living in a place that is high above the ground or sea (high in altitude). The thinner, dried air causes more fluid loss. Doing exercises that put stress on your body for a long time. What are the signs or symptoms? Symptoms of dehydration depend on how bad it is. Mild or worse dehydration Thirst. Dry lips or dry mouth. Feeling dizzy or light-headed, especially when you stand up from sitting. Muscle cramps. Your body making: Dark pee (urine). Pee may be the color of tea. Less pee than normal. Less tears than normal. Headache. Very bad dehydration Changes in skin. Skin may: Be cold to the touch (clammy). Be blotchy or pale. Not go back to normal right after you lightly pinch  it and let it go. Little or no tears, pee, or sweat. Changes in vital signs, such as: Fast breathing. Low blood pressure. Weak pulse. Pulse that is more than 100 beats a minute when you are sitting still. Other changes, such as: Feeling very thirsty. Eyes that look hollow (sunken). Cold hands and feet. Being mixed up (confused). Being very tired (lethargic) or having trouble waking from sleep. Short-term weight loss. Loss of consciousness. How is this treated? Treatment for this condition depends on how bad it is. Treatment should start right away. Do not wait until your condition gets very bad. Very bad dehydration is an emergency. You will need to go to a hospital. Mild or worse dehydration can be treated at home. You may be asked to: Drink more fluids. Drink an oral rehydration solution (ORS). This drink helps get the right amounts of fluids and salts and minerals in the blood (electrolytes). Very bad dehydration can be treated: With fluids through an IV tube. By getting normal levels of salts and minerals in your blood. This is often done by giving salts and minerals through a tube. The tube is passed through your nose and into your stomach. By treating the root cause. Follow these instructions at home: Oral rehydration solution If told by your doctor, drink an ORS: Make an ORS. Use instructions on the package. Start by drinking small amounts, about  cup (120 mL) every 5-10 minutes. Slowly drink more until you have had the amount that your doctor said to have. Eating and drinking          Drink enough clear fluid to keep your pee pale yellow. If you were told to drink an ORS, finish the ORS first. Then, start slowly drinking other clear fluids. Drink fluids such as: Water. Do not drink only water. Doing that can make the salt (sodium) level in your body get too low. Water from ice chips you suck on. Fruit juice that you have added water to (diluted). Low-calorie sports  drinks. Eat foods that have the right amounts of salts and minerals, such as: Bananas. Oranges. Potatoes. Tomatoes. Spinach. Do not drink alcohol. Avoid: Drinks that have a lot of sugar. These include: High-calorie sports drinks. Fruit juice that you did not add water to. Soda. Caffeine. Foods that are greasy or have a lot of fat or sugar. General instructions Take over-the-counter and prescription medicines only as told by your doctor. Do not take salt tablets. Doing that can make the salt level in your body get too high. Return to your normal activities as told by your doctor. Ask your doctor what activities are safe for you. Keep all follow-up visits as told by your doctor. This is important. Contact a doctor if: You have pain in your belly (abdomen) and the pain: Gets worse. Stays in one place. You have a rash. You have a stiff neck. You get angry or annoyed (irritable) more easily than normal. You are more tired or have a harder time waking than normal. You feel: Weak or dizzy. Very thirsty. Get help right away if you have: Any symptoms of very bad dehydration. Symptoms of vomiting, such as: You cannot eat or drink without vomiting. Your vomiting gets worse or does not go away. Your vomit has blood or green stuff in it. Symptoms that get worse with treatment. A fever. A very bad headache. Problems with peeing or pooping (having a bowel movement), such as: Watery poop that gets worse or does not go away. Blood in your poop (stool). This may cause poop to look black and tarry. Not peeing in 6-8 hours. Peeing only a small amount of very dark pee in 6-8 hours. Trouble breathing. These symptoms may be an emergency. Do not wait to see if the symptoms will go away. Get medical help right away. Call your local emergency services (911 in the [place] Do not drive yourself to the hospital. Summary Dehydration is a condition in which there is not enough water or other fluids  in the body. This happens when a person loses more fluids than he or she takes in. Treatment for this condition depends on how bad it is. Treatment should be started right away. Do not wait until your condition gets very bad. Drink enough clear fluid to keep your pee pale yellow. If you were told to drink an oral rehydration solution (ORS), finish the ORS first. Then, start slowly drinking other clear fluids. Take over-the-counter and prescription medicines only as told by your doctor. Get help right away if you have any symptoms of very bad dehydration. This information is not intended to replace advice given to you by your health care provider. Make sure you discuss any questions you have with your health care provider. Document Revised: 08/15/2021 Document Reviewed: 11/19/2018 [employer] Patient Education  2023 [employer]  

## 2022-06-01 ENCOUNTER — Inpatient Hospital Stay: Payer: Medicare Other

## 2022-06-01 VITALS — BP 170/68 | HR 66 | Temp 97.9°F | Resp 18

## 2022-06-01 DIAGNOSIS — Z5111 Encounter for antineoplastic chemotherapy: Secondary | ICD-10-CM | POA: Diagnosis not present

## 2022-06-01 DIAGNOSIS — C5701 Malignant neoplasm of right fallopian tube: Secondary | ICD-10-CM

## 2022-06-01 DIAGNOSIS — Z7189 Other specified counseling: Secondary | ICD-10-CM

## 2022-06-01 MED ORDER — SODIUM CHLORIDE 0.9% FLUSH
10.0000 mL | Freq: Once | INTRAVENOUS | Status: AC | PRN
  Administered 2022-06-01: 10 mL

## 2022-06-01 MED ORDER — HEPARIN SOD (PORK) LOCK FLUSH 100 UNIT/ML IV SOLN
500.0000 [IU] | Freq: Once | INTRAVENOUS | Status: AC | PRN
  Administered 2022-06-01: 500 [IU]

## 2022-06-01 MED ORDER — SODIUM CHLORIDE 0.9% FLUSH
10.0000 mL | Freq: Once | INTRAVENOUS | Status: AC
  Administered 2022-06-01: 10 mL

## 2022-06-01 MED ORDER — SODIUM CHLORIDE 0.9 % IV SOLN
1000.0000 mL | Freq: Once | INTRAVENOUS | Status: AC
  Administered 2022-06-01: 1000 mL via INTRAVENOUS

## 2022-06-01 NOTE — Patient Instructions (Signed)
Dehydration, Adult Dehydration is condition in which there is not enough water or other fluids in the body. This happens when a person loses more fluids than he or she takes in. Important body parts cannot work right without the right amount of fluids. Any loss of fluids from the body can cause dehydration. Dehydration can be mild, worse, or very bad. It should be treated right away to keep it from getting very bad. What are the causes? This condition may be caused by: Conditions that cause loss of water or other fluids, such as: Watery poop (diarrhea). Vomiting. Sweating a lot. Peeing (urinating) a lot. Not drinking enough fluids, especially when you: Are ill. Are doing things that take a lot of energy to do. Other illnesses and conditions, such as fever or infection. Certain medicines, such as medicines that take extra fluid out of the body (diuretics). Lack of safe drinking water. Not being able to get enough water and food. What increases the risk? The following factors may make you more likely to develop this condition: Having a long-term (chronic) illness that has not been treated the right way, such as: Diabetes. Heart disease. Kidney disease. Being 65 years of age or older. Having a disability. Living in a place that is high above the ground or sea (high in altitude). The thinner, dried air causes more fluid loss. Doing exercises that put stress on your body for a long time. What are the signs or symptoms? Symptoms of dehydration depend on how bad it is. Mild or worse dehydration Thirst. Dry lips or dry mouth. Feeling dizzy or light-headed, especially when you stand up from sitting. Muscle cramps. Your body making: Dark pee (urine). Pee may be the color of tea. Less pee than normal. Less tears than normal. Headache. Very bad dehydration Changes in skin. Skin may: Be cold to the touch (clammy). Be blotchy or pale. Not go back to normal right after you lightly pinch  it and let it go. Little or no tears, pee, or sweat. Changes in vital signs, such as: Fast breathing. Low blood pressure. Weak pulse. Pulse that is more than 100 beats a minute when you are sitting still. Other changes, such as: Feeling very thirsty. Eyes that look hollow (sunken). Cold hands and feet. Being mixed up (confused). Being very tired (lethargic) or having trouble waking from sleep. Short-term weight loss. Loss of consciousness. How is this treated? Treatment for this condition depends on how bad it is. Treatment should start right away. Do not wait until your condition gets very bad. Very bad dehydration is an emergency. You will need to go to a hospital. Mild or worse dehydration can be treated at home. You may be asked to: Drink more fluids. Drink an oral rehydration solution (ORS). This drink helps get the right amounts of fluids and salts and minerals in the blood (electrolytes). Very bad dehydration can be treated: With fluids through an IV tube. By getting normal levels of salts and minerals in your blood. This is often done by giving salts and minerals through a tube. The tube is passed through your nose and into your stomach. By treating the root cause. Follow these instructions at home: Oral rehydration solution If told by your doctor, drink an ORS: Make an ORS. Use instructions on the package. Start by drinking small amounts, about  cup (120 mL) every 5-10 minutes. Slowly drink more until you have had the amount that your doctor said to have. Eating and drinking          Drink enough clear fluid to keep your pee pale yellow. If you were told to drink an ORS, finish the ORS first. Then, start slowly drinking other clear fluids. Drink fluids such as: Water. Do not drink only water. Doing that can make the salt (sodium) level in your body get too low. Water from ice chips you suck on. Fruit juice that you have added water to (diluted). Low-calorie sports  drinks. Eat foods that have the right amounts of salts and minerals, such as: Bananas. Oranges. Potatoes. Tomatoes. Spinach. Do not drink alcohol. Avoid: Drinks that have a lot of sugar. These include: High-calorie sports drinks. Fruit juice that you did not add water to. Soda. Caffeine. Foods that are greasy or have a lot of fat or sugar. General instructions Take over-the-counter and prescription medicines only as told by your doctor. Do not take salt tablets. Doing that can make the salt level in your body get too high. Return to your normal activities as told by your doctor. Ask your doctor what activities are safe for you. Keep all follow-up visits as told by your doctor. This is important. Contact a doctor if: You have pain in your belly (abdomen) and the pain: Gets worse. Stays in one place. You have a rash. You have a stiff neck. You get angry or annoyed (irritable) more easily than normal. You are more tired or have a harder time waking than normal. You feel: Weak or dizzy. Very thirsty. Get help right away if you have: Any symptoms of very bad dehydration. Symptoms of vomiting, such as: You cannot eat or drink without vomiting. Your vomiting gets worse or does not go away. Your vomit has blood or green stuff in it. Symptoms that get worse with treatment. A fever. A very bad headache. Problems with peeing or pooping (having a bowel movement), such as: Watery poop that gets worse or does not go away. Blood in your poop (stool). This may cause poop to look black and tarry. Not peeing in 6-8 hours. Peeing only a small amount of very dark pee in 6-8 hours. Trouble breathing. These symptoms may be an emergency. Do not wait to see if the symptoms will go away. Get medical help right away. Call your local emergency services (911 in the [place] Do not drive yourself to the hospital. Summary Dehydration is a condition in which there is not enough water or other fluids  in the body. This happens when a person loses more fluids than he or she takes in. Treatment for this condition depends on how bad it is. Treatment should be started right away. Do not wait until your condition gets very bad. Drink enough clear fluid to keep your pee pale yellow. If you were told to drink an oral rehydration solution (ORS), finish the ORS first. Then, start slowly drinking other clear fluids. Take over-the-counter and prescription medicines only as told by your doctor. Get help right away if you have any symptoms of very bad dehydration. This information is not intended to replace advice given to you by your health care provider. Make sure you discuss any questions you have with your health care provider. Document Revised: 08/15/2021 Document Reviewed: 11/19/2018 [employer] Patient Education  2023 [employer]  

## 2022-06-03 ENCOUNTER — Inpatient Hospital Stay: Payer: Medicare Other

## 2022-06-03 ENCOUNTER — Other Ambulatory Visit (HOSPITAL_COMMUNITY): Payer: Self-pay

## 2022-06-03 ENCOUNTER — Other Ambulatory Visit: Payer: Self-pay | Admitting: Hematology and Oncology

## 2022-06-03 VITALS — BP 160/78 | HR 67 | Temp 98.5°F | Resp 17

## 2022-06-03 DIAGNOSIS — C5701 Malignant neoplasm of right fallopian tube: Secondary | ICD-10-CM

## 2022-06-03 DIAGNOSIS — Z7189 Other specified counseling: Secondary | ICD-10-CM

## 2022-06-03 DIAGNOSIS — Z5111 Encounter for antineoplastic chemotherapy: Secondary | ICD-10-CM | POA: Diagnosis not present

## 2022-06-03 MED ORDER — OXYCODONE HCL 5 MG PO TABS
5.0000 mg | ORAL_TABLET | ORAL | 0 refills | Status: DC | PRN
  Filled 2022-06-03: qty 60, 10d supply, fill #0

## 2022-06-03 MED ORDER — SODIUM CHLORIDE 0.9% FLUSH
10.0000 mL | Freq: Once | INTRAVENOUS | Status: AC | PRN
  Administered 2022-06-03: 10 mL

## 2022-06-03 MED ORDER — HEPARIN SOD (PORK) LOCK FLUSH 100 UNIT/ML IV SOLN
500.0000 [IU] | Freq: Once | INTRAVENOUS | Status: AC | PRN
  Administered 2022-06-03: 500 [IU]

## 2022-06-03 MED ORDER — SODIUM CHLORIDE 0.9 % IV SOLN: Freq: Once | INTRAVENOUS | Status: AC

## 2022-06-03 NOTE — Progress Notes (Signed)
Patient reported wheezing and itching last night, symptoms relieved by Benadryl.  Asymptomatic today before infusion.  MD made aware.  Patient reports improvement in mouth sores and increased fluid intake since starting home oxycodone.  Patient does not think she needs to schedule more IVF infusions at this time.  MD aware.  Patient made aware of medication refill called in to pharmacy by MD.  Angie Fava, RN

## 2022-06-03 NOTE — Patient Instructions (Signed)
Dehydration, Adult Dehydration is a condition in which there is not enough water or other fluids in the body. This happens when a person loses more fluids than he or she takes in. Important organs, such as the kidneys, brain, and heart, cannot function without a proper amount of fluids. Any loss of fluids from the body can lead to dehydration. Dehydration can be mild, moderate, or severe. It should be treated right away to prevent it from becoming severe. What are the causes? Dehydration may be caused by: Conditions that cause loss of water or other fluids, such as diarrhea, vomiting, or sweating or urinating a lot. Not drinking enough fluids, especially when you are ill or doing activities that require a lot of energy. Other illnesses and conditions, such as fever or infection. Certain medicines, such as medicines that remove excess fluid from the body (diuretics). Lack of safe drinking water. Not being able to get enough water and food. What increases the risk? The following factors may make you more likely to develop this condition: Having a long-term (chronic) illness that has not been treated properly, such as diabetes, heart disease, or kidney disease. Being 65 years of age or older. Having a disability. Living in a place that is high in altitude, where thinner, drier air causes more fluid loss. Doing exercises that put stress on your body for a long time (endurance sports). What are the signs or symptoms? Symptoms of dehydration depend on how severe it is. Mild or moderate dehydration Thirst. Dry lips or dry mouth. Dizziness or light-headedness, especially when standing up from a seated position. Muscle cramps. Dark urine. Urine may be the color of tea. Less urine or tears produced than usual. Headache. Severe dehydration Changes in skin. Your skin may be cold and clammy, blotchy, or pale. Your skin also may not return to normal after being lightly pinched and released. Little or  no tears, urine, or sweat. Changes in vital signs, such as rapid breathing and low blood pressure. Your pulse may be weak or may be faster than 100 beats a minute when you are sitting still. Other changes, such as: Feeling very thirsty. Sunken eyes. Cold hands and feet. Confusion. Being very tired (lethargic) or having trouble waking from sleep. Short-term weight loss. Loss of consciousness. How is this diagnosed? This condition is diagnosed based on your symptoms and a physical exam. You may have blood and urine tests to help confirm the diagnosis. How is this treated? Treatment for this condition depends on how severe it is. Treatment should be started right away. Do not wait until dehydration becomes severe. Severe dehydration is an emergency and needs to be treated in a hospital. Mild or moderate dehydration can be treated at home. You may be asked to: Drink more fluids. Drink an oral rehydration solution (ORS). This drink helps restore proper amounts of fluids and salts and minerals in the blood (electrolytes). Severe dehydration can be treated: With IV fluids. By correcting abnormal levels of electrolytes. This is often done by giving electrolytes through a tube that is passed through your nose and into your stomach (nasogastric tube, or NG tube). By treating the underlying cause of dehydration. Follow these instructions at home: Oral rehydration solution If told by your health care provider, drink an ORS: Make an ORS by following instructions on the package. Start by drinking small amounts, about  cup (120 mL) every 5-10 minutes. Slowly increase how much you drink until you have taken the amount recommended by your health   care provider. Eating and drinking        Drink enough clear fluid to keep your urine pale yellow. If you were told to drink an ORS, finish the ORS first and then start slowly drinking other clear fluids. Drink fluids such as: Water. Do not drink only  water. Doing that can lead to hyponatremia, which is having too little salt (sodium) in the body. Water from ice chips you suck on. Fruit juice that you have added water to (diluted fruit juice). Low-calorie sports drinks. Eat foods that contain a healthy balance of electrolytes, such as bananas, oranges, potatoes, tomatoes, and spinach. Do not drink alcohol. Avoid the following: Drinks that contain a lot of sugar. These include high-calorie sports drinks, fruit juice that is not diluted, and soda. Caffeine. Foods that are greasy or contain a lot of fat or sugar. General instructions Take over-the-counter and prescription medicines only as told by your health care provider. Do not take sodium tablets. Doing that can lead to having too much sodium in the body (hypernatremia). Return to your normal activities as told by your health care provider. Ask your health care provider what activities are safe for you. Keep all follow-up visits as told by your health care provider. This is important. Contact a health care provider if: You have muscle cramps, pain, or discomfort, such as: Pain in your abdomen and the pain gets worse or stays in one area (localizes). Stiff neck. You have a rash. You are more irritable than usual. You are sleepier or have a harder time waking than usual. You feel weak or dizzy. You feel very thirsty. Get help right away if you have: Any symptoms of severe dehydration. Symptoms of vomiting, such as: You cannot eat or drink without vomiting. Vomiting gets worse or does not go away. Vomit includes blood or green matter (bile). Symptoms that get worse with treatment. A fever. A severe headache. Problems with urination or bowel movements, such as: Diarrhea that gets worse or does not go away. Blood in your stool (feces). This may cause stool to look black and tarry. Not urinating, or urinating only a small amount of very dark urine, within 6-8 hours. Trouble  breathing. These symptoms may represent a serious problem that is an emergency. Do not wait to see if the symptoms will go away. Get medical help right away. Call your local emergency services (911 in the [place] Do not drive yourself to the hospital. Summary Dehydration is a condition in which there is not enough water or other fluids in the body. This happens when a person loses more fluids than he or she takes in. Treatment for this condition depends on how severe it is. Treatment should be started right away. Do not wait until dehydration becomes severe. Drink enough clear fluid to keep your urine pale yellow. If you were told to drink an oral rehydration solution (ORS), finish the ORS first and then start slowly drinking other clear fluids. Take over-the-counter and prescription medicines only as told by your health care provider. Get help right away if you have any symptoms of severe dehydration. This information is not intended to replace advice given to you by your health care provider. Make sure you discuss any questions you have with your health care provider. Document Revised: 08/15/2021 Document Reviewed: 11/19/2018 Elsevier Patient Education  2023 [employer]  

## 2022-06-05 ENCOUNTER — Encounter (HOSPITAL_COMMUNITY): Payer: Self-pay

## 2022-06-05 ENCOUNTER — Ambulatory Visit (HOSPITAL_COMMUNITY)
Admission: RE | Admit: 2022-06-05 | Discharge: 2022-06-05 | Disposition: A | Discharge status: Home / Self Care | Payer: Medicare Other | Source: Ambulatory Visit | Attending: Hematology and Oncology | Admitting: Hematology and Oncology

## 2022-06-05 DIAGNOSIS — C5701 Malignant neoplasm of right fallopian tube: Secondary | ICD-10-CM | POA: Insufficient documentation

## 2022-06-05 MED ORDER — IOHEXOL 9 MG/ML PO SOLN
500.0000 mL | ORAL | Status: AC
  Administered 2022-06-05 (×2): 500 mL via ORAL

## 2022-06-05 MED ORDER — IOHEXOL 300 MG/ML  SOLN
80.0000 mL | Freq: Once | INTRAMUSCULAR | Status: AC | PRN
  Administered 2022-06-05: 80 mL via INTRAVENOUS

## 2022-06-05 MED ORDER — IOHEXOL 9 MG/ML PO SOLN
ORAL | Status: AC
  Filled 2022-06-05: qty 1000

## 2022-06-07 ENCOUNTER — Other Ambulatory Visit: Payer: Self-pay | Admitting: Hematology and Oncology

## 2022-06-07 MED FILL — Dexamethasone Sodium Phosphate Inj 100 MG/10ML: INTRAMUSCULAR | Qty: 1 | Status: AC

## 2022-06-07 MED FILL — Fosaprepitant Dimeglumine For IV Infusion 150 MG (Base Eq): INTRAVENOUS | Qty: 5 | Status: AC

## 2022-06-09 ENCOUNTER — Other Ambulatory Visit: Payer: Self-pay | Admitting: Hematology and Oncology

## 2022-06-09 DIAGNOSIS — N951 Menopausal and female climacteric states: Secondary | ICD-10-CM

## 2022-06-10 ENCOUNTER — Inpatient Hospital Stay: Payer: Medicare Other

## 2022-06-10 ENCOUNTER — Encounter: Payer: Self-pay | Admitting: Hematology and Oncology

## 2022-06-10 ENCOUNTER — Other Ambulatory Visit (HOSPITAL_COMMUNITY): Payer: Self-pay

## 2022-06-10 ENCOUNTER — Inpatient Hospital Stay (HOSPITAL_BASED_OUTPATIENT_CLINIC_OR_DEPARTMENT_OTHER): Payer: Medicare Other | Admitting: Hematology and Oncology

## 2022-06-10 ENCOUNTER — Other Ambulatory Visit: Payer: Self-pay | Admitting: Hematology and Oncology

## 2022-06-10 VITALS — BP 196/80 | HR 70 | Temp 97.9°F | Resp 18 | Ht 65.0 in | Wt 145.6 lb

## 2022-06-10 VITALS — BP 211/95 | HR 75 | Resp 16

## 2022-06-10 DIAGNOSIS — C5701 Malignant neoplasm of right fallopian tube: Secondary | ICD-10-CM

## 2022-06-10 DIAGNOSIS — Z7189 Other specified counseling: Secondary | ICD-10-CM | POA: Diagnosis not present

## 2022-06-10 DIAGNOSIS — N183 Chronic kidney disease, stage 3 unspecified: Secondary | ICD-10-CM

## 2022-06-10 DIAGNOSIS — Z5111 Encounter for antineoplastic chemotherapy: Secondary | ICD-10-CM | POA: Diagnosis not present

## 2022-06-10 DIAGNOSIS — I10 Essential (primary) hypertension: Secondary | ICD-10-CM | POA: Diagnosis not present

## 2022-06-10 LAB — CMP (CANCER CENTER ONLY)
ALT: 6 U/L (ref 0–44)
AST: 13 U/L — ABNORMAL LOW (ref 15–41)
Albumin: 3.4 g/dL — ABNORMAL LOW (ref 3.5–5.0)
Alkaline Phosphatase: 61 U/L (ref 38–126)
Anion gap: 9 (ref 5–15)
BUN: 14 mg/dL (ref 8–23)
CO2: 22 mmol/L (ref 22–32)
Calcium: 8.1 mg/dL — ABNORMAL LOW (ref 8.9–10.3)
Chloride: 101 mmol/L (ref 98–111)
Creatinine: 0.98 mg/dL (ref 0.44–1.00)
GFR, Estimated: >60 mL/min (ref >60)
Glucose, Bld: 230 mg/dL — ABNORMAL HIGH (ref 70–99)
Potassium: 4.2 mmol/L (ref 3.5–5.1)
Sodium: 132 mmol/L — ABNORMAL LOW (ref 135–145)
Total Bilirubin: 0.7 mg/dL (ref 0.3–1.2)
Total Protein: 6.1 g/dL — ABNORMAL LOW (ref 6.5–8.1)

## 2022-06-10 LAB — CBC WITH DIFFERENTIAL (CANCER CENTER ONLY)
Abs Immature Granulocytes: 0.09 10*3/uL — ABNORMAL HIGH (ref 0.00–0.07)
Basophils Absolute: 0 10*3/uL (ref 0.0–0.1)
Basophils Relative: 0 %
Eosinophils Absolute: 0 10*3/uL (ref 0.0–0.5)
Eosinophils Relative: 0 %
HCT: 31.4 % — ABNORMAL LOW (ref 36.0–46.0)
Hemoglobin: 10.2 g/dL — ABNORMAL LOW (ref 12.0–15.0)
Immature Granulocytes: 1 %
Lymphocytes Relative: 10 %
Lymphs Abs: 0.7 10*3/uL (ref 0.7–4.0)
MCH: 31.7 pg (ref 26.0–34.0)
MCHC: 32.5 g/dL (ref 30.0–36.0)
MCV: 97.5 fL (ref 80.0–100.0)
Monocytes Absolute: 0.2 10*3/uL (ref 0.1–1.0)
Monocytes Relative: 3 %
Neutro Abs: 6.6 10*3/uL (ref 1.7–7.7)
Neutrophils Relative %: 86 %
Platelet Count: 193 10*3/uL (ref 150–400)
RBC: 3.22 MIL/uL — ABNORMAL LOW (ref 3.87–5.11)
RDW: 17.5 % — ABNORMAL HIGH (ref 11.5–15.5)
WBC Count: 7.7 10*3/uL (ref 4.0–10.5)
nRBC: 0 % (ref 0.0–0.2)

## 2022-06-10 MED ORDER — SODIUM CHLORIDE 0.9 % IV SOLN
350.0000 mg | Freq: Once | INTRAVENOUS | Status: AC
  Administered 2022-06-10: 350 mg via INTRAVENOUS
  Filled 2022-06-10: qty 35

## 2022-06-10 MED ORDER — FAMOTIDINE IN NACL 20-0.9 MG/50ML-% IV SOLN
20.0000 mg | Freq: Once | INTRAVENOUS | Status: AC
  Administered 2022-06-10: 20 mg via INTRAVENOUS
  Filled 2022-06-10: qty 50

## 2022-06-10 MED ORDER — HEPARIN SOD (PORK) LOCK FLUSH 100 UNIT/ML IV SOLN
500.0000 [IU] | Freq: Once | INTRAVENOUS | Status: AC | PRN
  Administered 2022-06-10: 500 [IU]

## 2022-06-10 MED ORDER — CETIRIZINE HCL 10 MG/ML IV SOLN
10.0000 mg | Freq: Once | INTRAVENOUS | Status: AC
  Administered 2022-06-10: 10 mg via INTRAVENOUS
  Filled 2022-06-10: qty 1

## 2022-06-10 MED ORDER — SODIUM CHLORIDE 0.9 % IV SOLN
150.0000 mg | Freq: Once | INTRAVENOUS | Status: AC
  Administered 2022-06-10: 150 mg via INTRAVENOUS
  Filled 2022-06-10: qty 150

## 2022-06-10 MED ORDER — SODIUM CHLORIDE 0.9% FLUSH
10.0000 mL | Freq: Once | INTRAVENOUS | Status: AC
  Administered 2022-06-10: 10 mL

## 2022-06-10 MED ORDER — SODIUM CHLORIDE 0.9% FLUSH
10.0000 mL | INTRAVENOUS | Status: DC | PRN
  Administered 2022-06-10: 10 mL

## 2022-06-10 MED ORDER — SODIUM CHLORIDE 0.9 % IV SOLN
10.0000 mg | Freq: Once | INTRAVENOUS | Status: AC
  Administered 2022-06-10: 10 mg via INTRAVENOUS
  Filled 2022-06-10: qty 10

## 2022-06-10 MED ORDER — SODIUM CHLORIDE 0.9 % IV SOLN
105.0000 mg/m2 | Freq: Once | INTRAVENOUS | Status: AC
  Administered 2022-06-10: 180 mg via INTRAVENOUS
  Filled 2022-06-10: qty 30

## 2022-06-10 MED ORDER — SODIUM CHLORIDE 0.9 % IV SOLN: Freq: Once | INTRAVENOUS | Status: AC

## 2022-06-10 MED ORDER — PALONOSETRON HCL INJECTION 0.25 MG/5ML
0.2500 mg | Freq: Once | INTRAVENOUS | Status: AC
  Administered 2022-06-10: 0.25 mg via INTRAVENOUS
  Filled 2022-06-10: qty 5

## 2022-06-10 NOTE — Progress Notes (Signed)
[place] OFFICE PROGRESS NOTE  Patient Care Team: Mayra Neer, MD as PCP - General (Family Medicine) Heath Lark, MD as Consulting Physician (Hematology and Oncology) Everitt Amber, MD as Consulting Physician (Obstetrics and Gynecology)  ASSESSMENT & PLAN:  Adenocarcinoma of right fallopian tube [facility]) I have reviewed multiple imaging studies with the patient and her husband She has positive response to therapy She did not tolerate treatment very well including uncontrolled hypertension I will omit bevacizumab today She will return in of the week to get IV fluid support I we will titrate her blood pressure medication accordingly  CKD (chronic kidney disease), stage III ([place]) She is intermittently dehydrated We will adjust the dose of her chemotherapy accordingly She will return end of the week to get IV fluid support  Essential hypertension Her blood pressure is profoundly elevated due to bevacizumab Bevacizumab is discontinued She is instructed to call if her systolic blood pressure stay over 150 She will continue atenolol, clonidine, Cardura and lisinopril  No orders of the defined types were placed in this encounter.   All questions were answered. The patient knows to call the clinic with any problems, questions or concerns. The total time spent in the appointment was 40 minutes encounter with patients including review of chart and various tests results, discussions about plan of care and coordination of care plan   Heath Lark, MD 06/10/2022 9:36 AM  INTERVAL HISTORY: Please see below for problem oriented charting. she returns for treatment follow-up seen prior to cycle 4 of treatment We also spent some time reviewing test results She has not been feeling great.  Oral intake is poor due to anorexia She denies dizziness, headaches or changes in vision with elevated blood pressure Her blood pressure documentation at home revealed uncontrolled hypertension,  with systolic blood pressure on average greater than 160  REVIEW OF SYSTEMS:   Constitutional: Denies fevers, chills or abnormal weight loss Eyes: Denies blurriness of vision Ears, nose, mouth, throat, and face: Denies mucositis or sore throat Respiratory: Denies cough, dyspnea or wheezes Cardiovascular: Denies palpitation, chest discomfort or lower extremity swelling Gastrointestinal:  Denies nausea, heartburn or change in bowel habits Skin: Denies abnormal skin rashes Lymphatics: Denies new lymphadenopathy or easy bruising Neurological:Denies numbness, tingling or new weaknesses Behavioral/Psych: Mood is stable, no new changes  All other systems were reviewed with the patient and are negative.  I have reviewed the past medical history, past surgical history, social history and family history with the patient and they are unchanged from previous note.  ALLERGIES:  is allergic to dilaudid [hydromorphone hcl], morphine and related, and sudafed [pseudoephedrine hcl].  MEDICATIONS:  Current Outpatient Medications  Medication Sig Dispense Refill   acetaminophen (TYLENOL) 325 MG tablet Take 650 mg by mouth every 6 (six) hours as needed for headache (pain).     acetaminophen (TYLENOL) 500 MG tablet Take 2 tablets (1,000 mg total) by mouth every 6 (six) hours as needed for mild pain.     atenolol (TENORMIN) 25 MG tablet TAKE 1 TABLET BY MOUTH EVERY DAY 30 tablet 2   cloNIDine (CATAPRES) 0.1 MG tablet TAKE 1 TABLET BY MOUTH EVERY DAY (Patient not taking: Reported on 05/20/2022) 30 tablet 1   dexamethasone (DECADRON) 4 MG tablet Take 2 tabs at the night before and 2 tab the morning of chemotherapy, every 3 weeks, by mouth x 6 cycles 36 tablet 6   doxazosin (CARDURA) 4 MG tablet TAKE 1 TABLET BY MOUTH EVERY DAY (Patient not taking:  Reported on 05/20/2022) 30 tablet 1   fluconazole (DIFLUCAN) 100 MG tablet Take 1 tablet (100 mg total) by mouth daily. 7 tablet 0   hydrochlorothiazide (HYDRODIURIL) 25  MG tablet TAKE 1 TABLET (25 MG TOTAL) BY MOUTH DAILY. (Patient not taking: Reported on 05/20/2022) 30 tablet 3   lidocaine-prilocaine (EMLA) cream Apply to affected area once daily as directed. (Patient taking differently: 1 application  as needed (access port). Apply to affected area once daily as directed.) 30 g 3   lisinopril (ZESTRIL) 20 MG tablet TAKE 1 TABLET BY MOUTH EVERY DAY IN THE EVENING (Patient taking differently: Take 20 mg by mouth every evening. Restarted 05/23/22) 60 tablet 0   magic mouthwash (nystatin, lidocaine, diphenhydrAMINE, alum & mag hydroxide) suspension Swish and spit 5 mls by mouth 4 times a day 240 mL 0   magic mouthwash w/lidocaine SOLN Take 5 mLs by mouth 4 (four) times daily. Swish and spit. 240 mL 0   ondansetron (ZOFRAN) 8 MG tablet Take 1 tablet (8 mg total) by mouth every 8 (eight) hours as needed for nausea or vomiting. Start on the third day after chemotherapy. 30 tablet 1   oxyCODONE (OXY IR/ROXICODONE) 5 MG immediate release tablet Take 1 tablet by mouth every 4 hours as needed for severe pain. 60 tablet 0   prochlorperazine (COMPAZINE) 10 MG tablet Take 1 tablet (10 mg total) by mouth every 6 (six) hours as needed for nausea or vomiting. 30 tablet 1   venlafaxine XR (EFFEXOR-XR) 37.5 MG 24 hr capsule TAKE 1 CAPSULE BY MOUTH DAILY WITH BREAKFAST. 90 capsule 3   No current facility-administered medications for this visit.   Facility-Administered Medications Ordered in Other Visits  Medication Dose Route Frequency Provider Last Rate Last Admin   CARBOplatin (PARAPLATIN) 350 mg in sodium chloride 0.9 % 100 mL chemo infusion  350 mg Intravenous Once Alvy Bimler, Almir Botts, MD       cetirizine (QUZYTTIR) injection 10 mg  10 mg Intravenous Once Alvy Bimler, Quorra Rosene, MD       famotidine (PEPCID) IVPB 20 mg premix  20 mg Intravenous Once Alvy Bimler, Bertice Risse, MD 200 mL/hr at 06/10/22 0933 20 mg at 06/10/22 0933   heparin lock flush 100 unit/mL  500 Units Intracatheter Once PRN Alvy Bimler, Montzerrat Brunell, MD        PACLitaxel (TAXOL) 180 mg in sodium chloride 0.9 % 250 mL chemo infusion (> 33m/m2)  105 mg/m2 (Treatment Plan Recorded) Intravenous Once GAlvy Bimler Yolette Hastings, MD       palonosetron (ALOXI) injection 0.25 mg  0.25 mg Intravenous Once GAlvy Bimler Shayma Pfefferle, MD       sodium chloride flush (NS) 0.9 % injection 10 mL  10 mL Intravenous PRN GAlvy Bimler Earlie Arciga, MD   10 mL at 02/11/17 0839   sodium chloride flush (NS) 0.9 % injection 10 mL  10 mL Intravenous PRN GAlvy Bimler [name] Wesch, MD   10 mL at 03/04/17 1510   sodium chloride flush (NS) 0.9 % injection 10 mL  10 mL Intracatheter PRN GHeath Lark MD        SUMMARY OF ONCOLOGIC HISTORY: Oncology History Overview Note  Neg genetics High grade serous    [place]  [place] Initial Diagnosis   The patient has many months of vague symptoms of fullness in the pelvis, urinary frequency and difficulty with defecation. She denies vaginal bleeding or rectal bleeding.     08/12/2016 Imaging   Abdomen X-ray in the ER: Moderate colonic stool burden without evidence of enteric obstruction  11/13/2016 Imaging   TVUS was performed on 11/13/16 which showed a large hypoechoic lobular mass seen posterior to the uterus measuring 18.2x16.1x11.8cm, blood flow was seen along the periphery of the mass. It was considered to be likely to be a fibroid vs pelvic mass vs ovarian mass. Neither ovary was removed. Moderate amount of free fluid in the pelvis. THe uterus measures 4x10x4cm. The endometrium is 28m.    12/05/2016 Imaging   MR pelvis 1. Mild limitations as detailed above. 2. Heterogeneous pelvic mass is favored to arise from the right ovary. Favor a solid ovarian neoplasm such as fibroma/Brenner's tumor. Given lesion size and heterogeneous T2 signal out, complicating torsion cannot be excluded. 3. Moderate abdominopelvic ascites, without specific evidence of peritoneal metastasis. Consider further evaluation with contrast-enhanced abdominopelvic CT.     12/26/2016 Pathology Results   1. Uterus, ovaries and fallopian tubes - ADENOCARCINOMA INVOLVING RIGHT FALLOPIAN TUBE, RIGHT AND LEFT OVARIES, UTERINE SEROSA AND PELVIC MASS. - SEE ONCOLOGY TABLE AND COMMENT. - UTERINE CERVIX, ENDOMETRIUM, MYOMETRIUM AND LEFT FALLOPIAN TUBE FREE OF TUMOR. 2. Omentum, resection for tumor - ADENOCARCINOMA. Microscopic Comment 1. ONCOLOGY TABLE - FALLOPIAN TUBE. SEE COMMENT 1. Specimen, including laterality: Uterus, bilateral adnexa, pelvic mass and omentum. 2. Procedure: Hysterectomy with bilateral salpingo-oophorectomy, pelvic mass excision and omentectomy. 3. Lymph node sampling performed: No 4. Tumor site: See comment. 5. Tumor location in fallopian tube: Right fallopian tube fimbria 6. Specimen integrity (intact/ruptured/disrupted): Intact 7. Tumor size (cm): 18 cm, see comment. 8. Histologic type: Adenocarcinoma 9. Grade: High grade 10. Microscopic tumor extension: Tumor involves right fallopian tube, right and left ovaries, uterine serosa, pelvic mass and omentum. 11. Margins: See comment. 12. Lymph-Vascular invasion: Present 13. Lymph nodes: # examined: 0; # positive: N/A 14. TNM: pT3c, pNX 15. FIGO Stage (based on pathologic findings, needs clinical correlation: III-C 16. Comments: There is an 18 cm pelvic mass which is a high grade adenocarcinoma and there is a 12.2 cm  segment of omentum which is extensively involved with adenocarcinoma. The tumor also involves the fimbria of the right fallopian tube and the parenchyma of the right and left ovaries as well as uterine serosa. The fimbria of the right fallopian has intraepithelial atypia consistent with a precursor lesion and therefore this is most consistent with primary fallopian tube adenocarcinoma. A primary peritoneal serous carcinoma is a less likely possibility.    12/26/2016 Pathology Results   PERITONEAL/ASCITIC FLUID(SPECIMEN 1 OF 1 COLLECTED 12/26/16): POORLY DIFFERENTIATED ADENOCARCINOMA    12/26/2016 Surgery   Procedure(s) Performed: Exploratory laparotomy with total abdominal hysterectomy, bilateral salpingo-oophorectomy, omentectomy radical tumor debulking for ovarian cancer .   Surgeon: EThereasa Solo MD.      Operative Findings: 20cm left ovarian mass densely adherent to the posterior uterus, right tube and ovary, cervix, sigmoid colon. 10cm omental cake. 4L ascites. 112msize tumor nodules on the serosa of the terminal ileum and proximal sigmoid colon. 13m19modules on right diaphragm.    This represented an optimal cytoreduction (R1) with 13mm83mdules on intestine and diaphragm representing gross visible disease   01/16/2017 Procedure   Placement of single lumen port a cath via right internal jugular vein. The catheter tip lies at the cavo-atrial junction. A power injectable port a cath was placed and is ready for immediate use.   01/17/2017 Imaging   1. 3.5 x 7.2 x 4.6 cm fluid collection along the vaginal cuff, posterior the bladder and extending into the right adnexal space. This lesion demonstrates rim enhancement. Imaging  features could be related to a loculated postoperative seroma or hematoma. Superinfection cannot be excluded by CT. Given debulking surgery was 3 weeks ago, this entire structure is un likely to represent neoplasm, but peritoneal involvement could have this appearance. 2. Small fluid collection in the left para colic gutter without rim enhancement. 3. Irregular/nodular appearance of the peritoneal M in the anatomic pelvis with areas of subtle nodularity between the stomach and the spleen. Close attention in these regions on follow-up recommended as metastatic disease is a concern. 4. Mildly enlarged hepatoduodenal ligament lymph node. Attention on follow-up recommended.   01/20/2017 Tumor Marker   Patient's tumor was tested for the following markers: CA-125 Results of the tumor marker test revealed 46.6   01/28/2017 Genetic Testing       Negative genetic  testing on the [facility] panel.  The [facility] gene panel offered by [employer] includes sequencing and deletion/duplication testing of the following 28 genes: APC, ATM, BARD1, BMPR1A, BRCA1, BRCA2, BRIP1, CHD1, CDK4, CDKN2A, CHEK2, EPCAM (large rearrangement only), MLH1, MSH2, MSH6, MUTYH, NBN, PALB2, PMS2, PTEN, RAD51C, RAD51D, SMAD4, STK11, and TP53. Sequencing was performed for select regions of POLE and POLD1, and large rearrangement analysis was performed for select regions of GREM1. The report date is January 28, 2017.  HRD testing looking for genomic instability and BRCA mutations was negative.  The report date of this test is January 27, 2017.    01/31/2017 Tumor Marker   Patient's tumor was tested for the following markers: CA-125 Results of the tumor marker test revealed 22.4   03/04/2017 Tumor Marker   Patient's tumor was tested for the following markers: CA-125 Results of the tumor marker test revealed 13.8   04/18/2017 Tumor Marker   Patient's tumor was tested for the following markers: CA-125 Results of the tumor marker test revealed 11.9   05/05/2017 Tumor Marker   Patient's tumor was tested for the following markers: CA-125 Results of the tumor marker test revealed 12   05/26/2017 Tumor Marker   Patient's tumor was tested for the following markers: CA-125 Results of the tumor marker test revealed 10.6   05/26/2017 Imaging   1. A previously noted postoperative fluid collection in the low pelvis and residual ascites seen on the prior examination has resolved on today's study. No findings to suggest residual/recurrent disease on today's examination. No definite solid organ metastasis identified in the abdomen or pelvis. No lymphadenopathy. 2. Aortic atherosclerosis.   06/10/2017 Procedure   Successful right IJ vein Port-A-Cath explant.   03/09/2018 Tumor Marker   Patient's tumor was tested for the following markers: CA-125 Results of the tumor marker test  revealed 12.1   05/26/2018 Tumor Marker   Patient's tumor was tested for the following markers: CA-125 Results of the tumor marker test revealed 13.8   09/09/2018 Tumor Marker   Patient's tumor was tested for the following markers: CA-125 Results of the tumor marker test revealed 23.9   12/18/2018 Tumor Marker   Patient's tumor was tested for the following markers: CA-125 Results of the tumor marker test revealed 43.8   02/27/2019 - 02/28/2019 Hospital Admission   She was admitted for bowel obstruction   02/27/2019 Imaging   1. High-grade small bowel obstruction with transition point in the pelvis in an area of suspected desmoplastic reaction surrounding a 1.3 cm spiculated nodule in the mesentery, suspicious for carcinoid. There is hyperenhancement near the tip of the appendix and loss of the normal fat plane between it and the  adjacent sigmoid colon, potentially the site of primary lesion. 2. Multiple new small subcentimeter hyperdense lesions scattered along the liver capsule, concerning for metastases. Enlarged hyperenhancing gastrohepatic and portacaval lymph nodes concerning for nodal metastases. 3. Very mild right hydroureteronephrosis to the level of the desmoplastic reaction in the pelvis, potentially involving the right ureter. 4. Infraumbilical ventral abdominal wall diastasis containing a small nondilated portion of transverse colon. 5. Trace ascites. 6. Cholelithiasis. 7.  Aortic atherosclerosis (ICD10-I70.0).     03/05/2019 Tumor Marker   Patient's tumor was tested for the following markers: CA-125 Results of the tumor marker test revealed 70.1   03/12/2019 Echocardiogram   IMPRESSIONS     1. Left ventricular ejection fraction, by visual estimation, is 60 to 65%. The left ventricle has normal function. There is no left ventricular hypertrophy.  2. Left ventricular diastolic parameters are consistent with Grade I diastolic dysfunction (impaired relaxation).  3. Global right  ventricle has normal systolic function.The right ventricular size is normal. No increase in right ventricular wall thickness.  4. Left atrial size was normal.  5. Right atrial size was normal.  6. The pericardium was not assessed.  7. The mitral valve is normal in structure. No evidence of mitral valve regurgitation.  8. The tricuspid valve is normal in structure. Tricuspid valve regurgitation is trivial.  9. The aortic valve is normal in structure. Aortic valve regurgitation is not visualized. 10. The pulmonic valve was grossly normal. Pulmonic valve regurgitation is not visualized. 11. The average left ventricular global longitudinal strain is -17.6 %.   03/16/2019 Procedure   Successful placement of a right internal jugular approach power injectable Port-A-Cath. The catheter is ready for immediate use.     04/19/2019 Tumor Marker   Patient's tumor was tested for the following markers: CA-125 Results of the tumor marker test revealed 39.8   05/17/2019 Tumor Marker   Patient's tumor was tested for the following markers: CA-125 Results of the tumor marker test revealed 27   05/28/2019 Tumor Marker   Patient's tumor was tested for the following markers: CA-125 Results of the tumor marker test revealed 27.7.   06/11/2019 Imaging   1. No new or progressive metastatic disease in the abdomen or pelvis. 2. Tiny noncalcified perisplenic implant is decreased. Calcified pericardiophrenic, retroperitoneal and bilateral inguinal lymph nodes and scattered small calcified perihepatic and perisplenic implants are stable. 3.  Aortic Atherosclerosis (ICD10-I70.0).       06/17/2019 Echocardiogram    1. Left ventricular ejection fraction, by estimation, is 65 to 70%. The left ventricle has normal function. The left ventricle has no regional wall motion abnormalities. Left ventricular diastolic parameters were normal.  2. Right ventricular systolic function is normal. The right ventricular size is  normal.  3. The mitral valve is normal in structure and function. No evidence of mitral valve regurgitation. No evidence of mitral stenosis.  4. The aortic valve is normal in structure and function. Aortic valve regurgitation is not visualized. No aortic stenosis is present.   06/21/2019 Tumor Marker   Patient's tumor was tested for the following markers: CA-125 Results of the tumor marker test revealed 18.7   07/19/2019 Tumor Marker   Patient's tumor was tested for the following markers: CA-125 Results of the tumor marker test revealed 16.7   08/30/2019 Tumor Marker   Patient's tumor was tested for the following markers: CA-125. Results of the tumor marker test revealed 13.9   09/15/2019 Echocardiogram    1. Left ventricular ejection fraction, by estimation,  is 65 to 70%. The left ventricle has normal function. The left ventricle has no regional wall motion abnormalities. Left ventricular diastolic parameters are consistent with Grade I diastolic dysfunction (impaired relaxation). The average left ventricular global longitudinal strain is 18.1 %. The global longitudinal strain is normal.  2. Right ventricular systolic function is normal. The right ventricular size is normal.  3. The mitral valve is normal in structure. No evidence of mitral valve regurgitation. No evidence of mitral stenosis.  4. The aortic valve is normal in structure. Aortic valve regurgitation is not visualized. No aortic stenosis is present.  5. The inferior vena cava is normal in size with greater than 50% respiratory variability, suggesting right atrial pressure of 3 mmHg.     09/24/2019 Imaging   1. Stable exam. No new or progressive metastatic disease on today's study. 2. Small subcapsular hypodensity in the lateral spleen, new in the interval, but indeterminate. Attention on follow-up recommended. 3. The calcified upper abdominal and groin lymph nodes are stable as are the scattered calcified and noncalcified peritoneal  implants. 4. Stable midline ventral hernia containing a short segment of colon without complicating features. 5. Aortic Atherosclerosis (ICD10-I70.0).     10/04/2019 - 09/21/2021 Chemotherapy   Patient is on Treatment Plan : ovarian Bevacizumab q21d     10/04/2019 Tumor Marker   Patient's tumor was tested for the following markers: CA-125. Results of the tumor marker test revealed 12.9.   11/01/2019 Tumor Marker   Patient's tumor was tested for the following markers: CA-125 Results of the tumor marker test revealed 15.4   12/13/2019 Tumor Marker   Patient's tumor was tested for the following markers: CA-125 Results of the tumor marker test revealed 14.8   12/31/2019 Imaging   1. Unchanged tiny peritoneal nodule adjacent to the spleen measuring 6 mm. Other previously noted peritoneal nodules are imperceptible on present examination. 2. Unchanged prominent, partially calcified portacaval lymph node and bilateral inguinal lymph nodes. 3. Unchanged subtle, nonspecific subcapsular lesion of the spleen.  4. No evidence of new metastatic disease in the abdomen or pelvis. 5. Status post hysterectomy. 6. Unchanged low midline ventral abdominal hernia containing a single loop of nonobstructed transverse colon. 7. Cholelithiasis. 8. Aortic Atherosclerosis (ICD10-I70.0).       01/03/2020 Tumor Marker   Patient's tumor was tested for the following markers: CA-125 Results of the tumor marker test revealed 11.7.   01/24/2020 Tumor Marker   Patient's tumor was tested for the following markers: CA-125. Results of the tumor marker test revealed 15.1   03/06/2020 Tumor Marker   Patient's tumor was tested for the following markers: CA-125. Results of the tumor marker test revealed 20.3   03/27/2020 Tumor Marker   Patient's tumor was tested for the following markers: CA-125 Results of the tumor marker test revealed 23.7   05/11/2020 Tumor Marker   Patient's tumor was tested for the following  markers: CA-125. Results of the tumor marker test revealed 25.4   06/23/2020 Imaging   1. Cholelithiasis with gallbladder wall thickening and mild infiltrative edema in the porta hepatis, raising the possibility of acute cholecystitis. There is truncation of the common bile duct distally and distal choledocholithiasis is difficult to exclude. Correlate with bilirubin levels. If clinically warranted, MRCP could be utilized for further characterization. 2. Stable tiny perisplenic nodule. 3. Other imaging findings of potential clinical significance: Small type 1 hiatal hernia. Prominent stool throughout the colon favors constipation. Ventral infraumbilical hernia contains transverse colon without findings of strangulation or  obstruction. Small supraumbilical hernia contains adipose tissue. Lumbar degenerative disc disease at L5-S1. Stable tiny nodule along the lateral margin of the spleen, nonspecific. 4. Aortic atherosclerosis.     12/15/2020 Imaging   No evidence of recurrent or metastatic carcinoma within the abdomen or pelvis.   Stable ventral hernia containing transverse colon. No evidence of bowel obstruction or strangulation.   Cholelithiasis. No radiographic evidence of cholecystitis.   Tiny hiatal hernia.   Aortic Atherosclerosis (ICD10-I70.0).     06/11/2021 Imaging   1. Numerous sub 4 mm pulmonary nodules in the lungs some of which have a cavitary appearance. I think it is unlikely this is metastatic disease and more likely a possible immunotherapy related process such as a sarcoid like reaction. Recommend full chest CT further evaluation. 2. Stable small calcified retroperitoneal and mesenteric lymph nodes. No findings suspicious for recurrent peritoneal carcinomatosis. 3. Stable lower abdominal wall hernia containing part of the transverse colon. 4. Cholelithiasis.   09/27/2021 Imaging   1. Numerous small pulmonary nodules throughout the lungs, the majority of which are cavitary.  These are all subcentimeter, however nodules that were seen in the bilateral lung bases on prior CT dated 06/08/2021 appear slightly increased in size and number. Behavior is highly suspicious for pulmonary metastatic disease despite the unusual appearance, however as previously reported general differential considerations include atypical infection and inflammation, such as drug-induced sarcoidosis like reaction.  2. Unchanged calcified lymph nodes in the abdomen and pelvis, consistent with treated nodal metastases. 3. Cholelithiasis with wall thickening and pericholecystic fluid. This appearance is similar to prior examination and suggests chronic cholecystitis. Correlate for clinical symptoms. Mild intrahepatic biliary ductal dilatation, similar to prior examination. 4. Midline ventral hernia containing a single nonobstructed loop of mid transverse colon.     12/25/2021 Imaging   1. Again noted are multiple thin walled cavitary lung nodules throughout both lungs. These are similar in size and multiplicity when compared with the exam from 09/26/2021. Etiology remains indeterminate, although metastatic disease cannot be excluded with a high degree of certainty. 2. Stable calcified upper abdominal lymph nodes consistent with treated nodal metastases. 3. Gallstones. 4. Aortic Atherosclerosis (ICD10-I70.0).   01/30/2022 Tumor Marker   Patient's tumor was tested for the following markers: CA-125. Results of the tumor marker test revealed 59.3.   01/31/2022 Imaging   1. No substantial interval change in exam. 2. Innumerable tiny cavitary pulmonary nodules in the lung bases. As noted previously, metastatic disease would be distinct consideration although imaging features are nonspecific and infectious/inflammatory etiology is not excluded. 3. Calcified nodal disease in the upper abdomen and pelvis shows no substantial interval change. Scattered noncalcified nodules in the mesentery are similar to  prior. 4. Markedly large stool volume throughout the colon. Imaging features suggestive of clinical constipation. 5. Cholelithiasis with ill-defined appearance of the gallbladder wall. Appearance is stable in the interval. 6. Trace free fluid in the cul-de-sac. 7. Midline ventral hernia contains a short segment of transverse colon without complicating features. 8. Aortic Atherosclerosis (ICD10-I70.0).   03/07/2022 Surgery   Procedure Date:  03/07/2022  Pre-operative Diagnosis:  Symptomatic cholelithiasis   Post-operative Diagnosis: Symptomatic cholelithiasis, peritoneal implants with history of right fallopian tube cancer.   Procedure:   Robotic assisted cholecystectomy with ICG FireFly cholangiogram Robotic assisted abdominal wall peritoneal implant excisional biopsy x 3.   Surgeon:  Melvyn Neth, MD  Specimens:   Gallbladder Peritoneal implants x 3    Indications for Procedure:  This is a 67 y.o. female  who presents with abdominal pain and workup revealing symptomatic cholelithiasis.  The benefits, complications, treatment options, and expected outcomes were discussed with the patient. The risks of bleeding, infection, recurrence of symptoms, failure to resolve symptoms, bile duct damage, bile duct leak, retained common bile duct stone, bowel injury, and need for further procedures were all discussed with the patient and she was willing to proceed.     03/07/2022 Pathology Results   SURGICAL PATHOLOGY  CASE: [id number]  PATIENT: Tenna Child  Surgical Pathology Report   Specimen Submitted:  A. Gallbladder  B. Abdominal wall   Clinical History: Symptomatic cholelithiasis   DIAGNOSIS:  A. GALLBLADDER; CHOLECYSTECTOMY:  - CHRONIC CHOLECYSTITIS WITH CHOLELITHIASIS.  - NEGATIVE FOR DYSPLASIA AND MALIGNANCY.  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   B. SOFT TISSUE, ABDOMINAL WALL; BIOPSY:   - POSITIVE FOR MALIGNANCY.  - METASTATIC CARCINOMA, COMPATIBLE WITH HIGH-GRADE SEROUS CARCINOMA OF GYNECOLOGIC PRIMARY.   Comment:  Immunohistochemical studies were performed on neoplastic cells in both sources. Tumor cells in both are positive for CK7, Pax-8, and WT-1, supporting the above diagnosis. P53 demonstrates strong and diffuse expression, indicative of mutated expression pattern.   There is sufficient tissue present for ancillary molecular testing if desired.    04/04/2022 Imaging   1. Mild progression of partially calcified peritoneal metastatic implants surrounding the liver, in the porta hepatis and in the periumbilical region. No generalized ascites or peritoneal nodularity status post omentectomy. 2. Grossly stable cavitary lesions at both lung bases, presumably reflecting metastatic disease. 3. No other evidence of progressive metastatic disease within the abdomen or pelvis. 4. Enlarging ventral hernia containing a portion of the transverse colon. No evidence of bowel obstruction or inflammation. 5.  Aortic Atherosclerosis (ICD10-I70.0).   04/08/2022 -  Chemotherapy   Patient is on Treatment Plan : OVARIAN Carboplatin + Paclitaxel + Bevacizumab q21d      04/09/2022 Tumor Marker   Patient's tumor was tested for the following markers: CA-125. Results of the tumor marker test revealed 86.7.   05/22/2022 Tumor Marker   Patient's tumor was tested for the following markers: CA-125. Results of the tumor marker test revealed 49.7.   06/05/2022 Imaging   1. New tiny bilateral pleural effusions. 2. Once again there are several small cavitary lesions in the lungs. Many of these are becoming more cavitary with less soft tissue elements and are smaller today. 3. Soft tissue thickening and nodularity along the surface of the liver appears to be overall decreasing from previous examination. Some of these areas have calcification. 4. Mesenteric nodules as well as retroperitoneal and  mesenteric nodes appear similar to slightly improved. The inguinal nodes seen previously are similar. 5. No developing new mass lesion, ascites or lymph node enlargement seen. 6. Of interest the density of the lumen of the renal pelvis bilaterally is increased on the left compared to the right in the portal venous phase. This is of uncertain etiology and significance. Please correlate with the urinalysis such as for hematuria.     PHYSICAL EXAMINATION: ECOG PERFORMANCE STATUS: 1 - Symptomatic but completely ambulatory  Vitals:   06/10/22 0839  BP: (!) 196/80  Pulse: 70  Resp: 18  Temp: 97.9 F (36.6 C)  SpO2: 100%   Filed Weights   06/10/22 0839  Weight: 145 lb 9.6 oz (66 kg)    GENERAL:alert, no distress and comfortable NEURO: alert & oriented x 3 with fluent speech, no focal motor/sensory deficits  LABORATORY DATA:  I have reviewed the data as listed    Component Value Date/Time   NA 132 (L) 06/10/2022 0811   NA 139 04/14/2017 0742   K 4.2 06/10/2022 0811   K 4.0 04/14/2017 0742   CL 101 06/10/2022 0811   CO2 22 06/10/2022 0811   CO2 21 (L) 04/14/2017 0742   GLUCOSE 230 (H) 06/10/2022 0811   GLUCOSE 247 (H) 04/14/2017 0742   BUN 14 06/10/2022 0811   BUN 13.1 04/14/2017 0742   CREATININE 0.98 06/10/2022 0811   CREATININE 0.9 04/14/2017 0742   CALCIUM 8.1 (L) 06/10/2022 0811   CALCIUM 9.2 04/14/2017 0742   PROT 6.1 (L) 06/10/2022 0811   PROT 7.3 04/14/2017 0742   ALBUMIN 3.4 (L) 06/10/2022 0811   ALBUMIN 3.9 04/14/2017 0742   AST 13 (L) 06/10/2022 0811   AST 17 04/14/2017 0742   ALT 6 06/10/2022 0811   ALT 14 04/14/2017 0742   ALKPHOS 61 06/10/2022 0811   ALKPHOS 70 04/14/2017 0742   BILITOT 0.7 06/10/2022 0811   BILITOT 0.37 04/14/2017 0742   GFRNONAA >60 06/10/2022 0811   GFRAA >60 01/24/2020 0924   GFRAA >60 07/19/2019 0951    No results found for: "SPEP", "UPEP"  Lab Results  Component Value Date   WBC 7.7 06/10/2022   NEUTROABS 6.6 06/10/2022    HGB 10.2 (L) 06/10/2022   HCT 31.4 (L) 06/10/2022   MCV 97.5 06/10/2022   PLT 193 06/10/2022      Chemistry      Component Value Date/Time   NA 132 (L) 06/10/2022 0811   NA 139 04/14/2017 0742   K 4.2 06/10/2022 0811   K 4.0 04/14/2017 0742   CL 101 06/10/2022 0811   CO2 22 06/10/2022 0811   CO2 21 (L) 04/14/2017 0742   BUN 14 06/10/2022 0811   BUN 13.1 04/14/2017 0742   CREATININE 0.98 06/10/2022 0811   CREATININE 0.9 04/14/2017 0742      Component Value Date/Time   CALCIUM 8.1 (L) 06/10/2022 0811   CALCIUM 9.2 04/14/2017 0742   ALKPHOS 61 06/10/2022 0811   ALKPHOS 70 04/14/2017 0742   AST 13 (L) 06/10/2022 0811   AST 17 04/14/2017 0742   ALT 6 06/10/2022 0811   ALT 14 04/14/2017 0742   BILITOT 0.7 06/10/2022 0811   BILITOT 0.37 04/14/2017 0742       RADIOGRAPHIC STUDIES: I have reviewed multiple CT imaging with the patient and her husband I have personally reviewed the radiological images as listed and agreed with the findings in the report. CT ABDOMEN PELVIS W CONTRAST  Result Date: 06/05/2022 CLINICAL DATA:  Assess treatment response with chemotherapy for ovarian cancer. Shortness of breath on exertion. * Tracking Code: BO * EXAM: CT ABDOMEN AND PELVIS WITH CONTRAST TECHNIQUE: Multidetector CT imaging of the abdomen and pelvis was performed using the standard protocol following bolus administration of intravenous contrast. RADIATION DOSE REDUCTION: This exam was performed according to the departmental dose-optimization program which includes automated exposure control, adjustment of the mA and/or kV according to patient size and/or use of iterative reconstruction technique. CONTRAST:  33m OMNIPAQUE IOHEXOL 300 MG/ML  SOLN COMPARISON:  CT 04/04/2022 and older FINDINGS: Lower chest: New tiny bilateral pleural effusions are identified. Once again there are several small cavitary lesions in the lungs. Many of these are becoming more cavitary and less soft tissue elements  and are smaller. Specific exams will be followed. The 7 mm lesion on the prior in the  right lower lobe, today on series 6, image 30 measures 6 mm. Again much less soft tissue component. Lesion just above this in the right lower lobe which measured 7 mm as well, today on image 19 of series 6 again 6 mm but much less soft tissue component. Small hiatal hernia. No pre cardiophrenic abnormal nodes. Hepatobiliary: Fatty liver infiltration identified. Contracted gallbladder. Persistent biliary duct ectasia. The thickening and calcifications seen along the margin of the falciform ligament anteriorly are stable today on image 20 of series 2. The surface implants are improving. Focus anterior to the left hepatic lobe superiorly which had measured 18 x 11 mm on the prior, today on series 2, image 14 measures 13 by 7 mm. Please see sagittal series 5 image 52. Additional focus posteriorly in segment 7 on image 18 of series 2 is stable. There is a rind of soft tissue along the lateral margin of the liver extending towards the dome. Pancreas: Global atrophy without enhancing mass or ductal dilatation. Spleen: Spleen is slightly enlarged with cephalocaudal length of 13.1 cm, similar in retrospect when adjusting for technique. Adrenals/Urinary Tract: Adrenal glands are preserved. Extrarenal pelvis seen of the right kidney and left. No enhancing renal mass or collecting system filling defect. Bosniak 1 exophytic left-sided renal cyst is stable. Of interest the density of the lumen of the renal pelvis bilaterally is increased on the left compared to right in the portal venous phase. This is of uncertain etiology and significance. Please correlate with the urinalysis. The ureters have normal course and caliber down to the bladder which is with preserved contour. The bladder is underdistended. Stomach/Bowel: Diffuse colonic stool. Large bowel is nondilated. There is rectus muscle diastasis with defect at the umbilicus with herniation  of loops of large bowel and small bowel without obstruction. This is a wide mouth hernia and is unchanged from previous. The small bowel is well is nondilated. Stomach is relatively collapsed. Vascular/Lymphatic: Diffuse vascular calcifications are seen. Preserved course and caliber of the aorta and IVC. Several enlarged nodes are identified. Specific lesions will be followed. This includes upper abdomen periceliac were some of the nodes are enlarged but calcified. Specific lesions will be followed for continuity. The lymph node to the right of the celiac on the prior which measured 20 x 13 mm, today is less well defined but estimated on series 2, image 24 at 19 by 10 mm. The lesion just above this which measured 15 x 10 mm also with some calcification, today on image 23 of series 2 measures 12 x 12 mm. Other retroperitoneal nodes are smaller ill-defined with more stranding but relatively similar in size. Bilateral inguinal nodes are again seen as well with some calcification. The right-sided focus which measured 18 x 14 mm, today on image 78 of series 2 measures 17 by 14 mm. Left-sided node which measured 14 x 17 mm on series 2, image 75 measures 14 x 16 mm. No new nodal enlargement. Reproductive: Status post hysterectomy. No adnexal masses. Other: No developing significant ascites. There is a small cystic focus identified the central mesentery on image 36 of series 2. This appears relatively simple measuring 15 by 12 mm with Hounsfield units of 13. There are some small areas of mesenteric nodularity which appear relatively similar to previous such as on the right side on image 41 measuring 14 by 9 mm, central pelvis image 60 of series 2 measuring 13 by 9 mm. This also could represent prominent nodes. No definite new areas  of peritoneal nodularity at this time Musculoskeletal: Mild degenerative changes of the spine and pelvis. Mild skin thickening along the anterior pelvic wall with some anasarca. IMPRESSION: 1.  New tiny bilateral pleural effusions. 2. Once again there are several small cavitary lesions in the lungs. Many of these are becoming more cavitary with less soft tissue elements and are smaller today. 3. Soft tissue thickening and nodularity along the surface of the liver appears to be overall decreasing from previous examination. Some of these areas have calcification. 4. Mesenteric nodules as well as retroperitoneal and mesenteric nodes appear similar to slightly improved. The inguinal nodes seen previously are similar. 5. No developing new mass lesion, ascites or lymph node enlargement seen. 6. Of interest the density of the lumen of the renal pelvis bilaterally is increased on the left compared to the right in the portal venous phase. This is of uncertain etiology and significance. Please correlate with the urinalysis such as for hematuria. Electronically Signed   By: Jill Side M.D.   On: 06/05/2022 13:40

## 2022-06-10 NOTE — Progress Notes (Signed)
Avastin held today due to elevated BP on arrival to infusion. Pt tolerated Paclitaxel and Carboplatin infusion well, BP remained elevated at discharge, 211/95. Pt asymptomatic. Alvy Bimler, MD notified. MD advised to take PO clonidine when she gets home. Pt notified and verbalized understanding, pt plans to monitor BP at home. Discharged via ambulation to lobby with husband.

## 2022-06-10 NOTE — Patient Instructions (Signed)
[facility]  Discharge Instructions: Thank you for choosing [facility] to provide your oncology and hematology care.   If you have a lab appointment with the [facility], please go directly to the [facility] and check in at the registration area.   Wear comfortable clothing and clothing appropriate for easy access to any Portacath or PICC line.   We strive to give you quality time with your provider. You may need to reschedule your appointment if you arrive late (15 or more minutes).  Arriving late affects you and other patients whose appointments are after yours.  Also, if you miss three or more appointments without notifying the office, you may be dismissed from the clinic at the provider's discretion.      For prescription refill requests, have your pharmacy contact our office and allow 72 hours for refills to be completed.    Today you received the following chemotherapy and/or immunotherapy agents: Taxol/Carboplatin     To help prevent nausea and vomiting after your treatment, we encourage you to take your nausea medication as directed.  BELOW ARE SYMPTOMS THAT SHOULD BE REPORTED IMMEDIATELY: *FEVER GREATER THAN 100.4 F (38 C) OR HIGHER *CHILLS OR SWEATING *NAUSEA AND VOMITING THAT IS NOT CONTROLLED WITH YOUR NAUSEA MEDICATION *UNUSUAL SHORTNESS OF BREATH *UNUSUAL BRUISING OR BLEEDING *URINARY PROBLEMS (pain or burning when urinating, or frequent urination) *BOWEL PROBLEMS (unusual diarrhea, constipation, pain near the anus) TENDERNESS IN MOUTH AND THROAT WITH OR WITHOUT PRESENCE OF ULCERS (sore throat, sores in mouth, or a toothache) UNUSUAL RASH, SWELLING OR PAIN  UNUSUAL VAGINAL DISCHARGE OR ITCHING   Items with * indicate a potential emergency and should be followed up as soon as possible or go to the Emergency Department if any problems should occur.  Please show the CHEMOTHERAPY ALERT CARD or IMMUNOTHERAPY ALERT CARD  at check-in to the Emergency Department and triage nurse.  Should you have questions after your visit or need to cancel or reschedule your appointment, please contact [facility]  Dept: [phone]  and follow the prompts.  Office hours are 8:00 a.m. to 4:30 p.m. Monday - Friday. Please note that voicemails left after 4:00 p.m. may not be returned until the following business day.  We are closed weekends and major holidays. You have access to a nurse at all times for urgent questions. Please call the main number to the clinic Dept: [phone] and follow the prompts.   For any non-urgent questions, you may also contact your provider using MyChart. We now offer e-Visits for anyone 5 and older to request care online for non-urgent symptoms. For details visit [email or web address].   Also download the MyChart app! Go to the app store, search "MyChart", open the app, select [facility], and log in with your MyChart username and password.

## 2022-06-10 NOTE — Assessment & Plan Note (Signed)
Her blood pressure is profoundly elevated due to bevacizumab Bevacizumab is discontinued She is instructed to call if her systolic blood pressure stay over 150 She will continue atenolol, clonidine, Cardura and lisinopril

## 2022-06-10 NOTE — Progress Notes (Signed)
Nutrition Follow-up:  Patient with adenocarcinoma of right fallopian tube.  Patient receiving carboplatinum and taxol.  Bevacizumab stopped due to elevated blood pressure.    Met with patient and husband during infusion.  Patient has had a poor appetite and mucositis since last treatment.  MMW and baking soda/salt rinses not helping mucositis.  Only able to get some relief from oxycodone.  Did have issues with constipation from pain medication but resolved at this time.  Ate a little bit more today (muffin for breakfast).  Able to tolerate tea and chocolate milk, chocolate pudding, chicken broth, tomato soup, small amount of pinto beans.     Medications: reviewed  Labs: glucose 230, Na 132  Anthropometrics:   Weight 145 lb 9.6 oz 150 lb 6.4 oz on 1/29 152 lb on 04/29/22 5% weight loss in the last month and half, concerning  NUTRITION DIAGNOSIS: Inadequate oral intake related to cancer related treatment side effects as evidenced by 5% weight loss in the last month and half and mucositis and poor po intake.     INTERVENTION:  Add ice cream to chocolate milk for added calories and protein.  Does not like oral nutrition supplements Continue pain medications for mucositis Maximize oral intake on good days when appetite is increased.      MONITORING, EVALUATION, GOAL: weight trends, intake   NEXT VISIT: Monday, March 11 during infusion  Baylynn Shifflett B. Zenia Resides, [place], [place] Registered Dietitian [phone]

## 2022-06-10 NOTE — Assessment & Plan Note (Signed)
I have reviewed multiple imaging studies with the patient and her husband She has positive response to therapy She did not tolerate treatment very well including uncontrolled hypertension I will omit bevacizumab today She will return in of the week to get IV fluid support I we will titrate her blood pressure medication accordingly

## 2022-06-10 NOTE — Assessment & Plan Note (Signed)
She is intermittently dehydrated We will adjust the dose of her chemotherapy accordingly She will return end of the week to get IV fluid support

## 2022-06-11 ENCOUNTER — Other Ambulatory Visit: Payer: Self-pay

## 2022-06-11 ENCOUNTER — Telehealth: Payer: Self-pay

## 2022-06-11 NOTE — Telephone Encounter (Signed)
She can take clonidine up to twice a day if needed

## 2022-06-11 NOTE — Telephone Encounter (Signed)
Called and told to take clonidine if systolic bp > Q000111Q twice a day if needed, per Dr. Alvy Bimler. She verbalized understanding and will call the office back for questions/ concerns.

## 2022-06-11 NOTE — Telephone Encounter (Signed)
Called to check on her today. She called last night to the answering service about bp at home 197/93. She was instructed to go to the ER, but she did not go. She did take clonidine yesterday when she got home after treatment.  Today bp 181/89 and HR 69. She is taking atenolol, Cardura and lisinopril. She is unsure how to take the clonidine Rx and needs instructions. Take daily if systolic bp > Q000111Q?

## 2022-06-12 ENCOUNTER — Other Ambulatory Visit: Payer: Self-pay

## 2022-06-13 ENCOUNTER — Inpatient Hospital Stay: Payer: Medicare Other

## 2022-06-13 VITALS — BP 161/79 | HR 62 | Temp 97.8°F | Resp 18

## 2022-06-13 DIAGNOSIS — C5701 Malignant neoplasm of right fallopian tube: Secondary | ICD-10-CM

## 2022-06-13 DIAGNOSIS — Z5111 Encounter for antineoplastic chemotherapy: Secondary | ICD-10-CM | POA: Diagnosis not present

## 2022-06-13 DIAGNOSIS — Z7189 Other specified counseling: Secondary | ICD-10-CM

## 2022-06-13 MED ORDER — SODIUM CHLORIDE 0.9 % IV SOLN: Freq: Once | INTRAVENOUS | Status: AC

## 2022-06-13 MED ORDER — HEPARIN SOD (PORK) LOCK FLUSH 100 UNIT/ML IV SOLN
500.0000 [IU] | Freq: Once | INTRAVENOUS | Status: AC | PRN
  Administered 2022-06-13: 500 [IU]

## 2022-06-13 MED ORDER — SODIUM CHLORIDE 0.9% FLUSH
10.0000 mL | Freq: Once | INTRAVENOUS | Status: AC | PRN
  Administered 2022-06-13: 10 mL

## 2022-06-13 NOTE — Patient Instructions (Signed)
Dehydration, Adult Dehydration is a condition in which there is not enough water or other fluids in the body. This happens when a person loses more fluids than he or she takes in. Important organs, such as the kidneys, brain, and heart, cannot function without a proper amount of fluids. Any loss of fluids from the body can lead to dehydration. Dehydration can be mild, moderate, or severe. It should be treated right away to prevent it from becoming severe. What are the causes? Dehydration may be caused by: Conditions that cause loss of water or other fluids, such as diarrhea, vomiting, or sweating or urinating a lot. Not drinking enough fluids, especially when you are ill or doing activities that require a lot of energy. Other illnesses and conditions, such as fever or infection. Certain medicines, such as medicines that remove excess fluid from the body (diuretics). Lack of safe drinking water. Not being able to get enough water and food. What increases the risk? The following factors may make you more likely to develop this condition: Having a long-term (chronic) illness that has not been treated properly, such as diabetes, heart disease, or kidney disease. Being 65 years of age or older. Having a disability. Living in a place that is high in altitude, where thinner, drier air causes more fluid loss. Doing exercises that put stress on your body for a long time (endurance sports). What are the signs or symptoms? Symptoms of dehydration depend on how severe it is. Mild or moderate dehydration Thirst. Dry lips or dry mouth. Dizziness or light-headedness, especially when standing up from a seated position. Muscle cramps. Dark urine. Urine may be the color of tea. Less urine or tears produced than usual. Headache. Severe dehydration Changes in skin. Your skin may be cold and clammy, blotchy, or pale. Your skin also may not return to normal after being lightly pinched and released. Little or  no tears, urine, or sweat. Changes in vital signs, such as rapid breathing and low blood pressure. Your pulse may be weak or may be faster than 100 beats a minute when you are sitting still. Other changes, such as: Feeling very thirsty. Sunken eyes. Cold hands and feet. Confusion. Being very tired (lethargic) or having trouble waking from sleep. Short-term weight loss. Loss of consciousness. How is this diagnosed? This condition is diagnosed based on your symptoms and a physical exam. You may have blood and urine tests to help confirm the diagnosis. How is this treated? Treatment for this condition depends on how severe it is. Treatment should be started right away. Do not wait until dehydration becomes severe. Severe dehydration is an emergency and needs to be treated in a hospital. Mild or moderate dehydration can be treated at home. You may be asked to: Drink more fluids. Drink an oral rehydration solution (ORS). This drink helps restore proper amounts of fluids and salts and minerals in the blood (electrolytes). Severe dehydration can be treated: With IV fluids. By correcting abnormal levels of electrolytes. This is often done by giving electrolytes through a tube that is passed through your nose and into your stomach (nasogastric tube, or NG tube). By treating the underlying cause of dehydration. Follow these instructions at home: Oral rehydration solution If told by your health care provider, drink an ORS: Make an ORS by following instructions on the package. Start by drinking small amounts, about  cup (120 mL) every 5-10 minutes. Slowly increase how much you drink until you have taken the amount recommended by your health   care provider. Eating and drinking        Drink enough clear fluid to keep your urine pale yellow. If you were told to drink an ORS, finish the ORS first and then start slowly drinking other clear fluids. Drink fluids such as: Water. Do not drink only  water. Doing that can lead to hyponatremia, which is having too little salt (sodium) in the body. Water from ice chips you suck on. Fruit juice that you have added water to (diluted fruit juice). Low-calorie sports drinks. Eat foods that contain a healthy balance of electrolytes, such as bananas, oranges, potatoes, tomatoes, and spinach. Do not drink alcohol. Avoid the following: Drinks that contain a lot of sugar. These include high-calorie sports drinks, fruit juice that is not diluted, and soda. Caffeine. Foods that are greasy or contain a lot of fat or sugar. General instructions Take over-the-counter and prescription medicines only as told by your health care provider. Do not take sodium tablets. Doing that can lead to having too much sodium in the body (hypernatremia). Return to your normal activities as told by your health care provider. Ask your health care provider what activities are safe for you. Keep all follow-up visits as told by your health care provider. This is important. Contact a health care provider if: You have muscle cramps, pain, or discomfort, such as: Pain in your abdomen and the pain gets worse or stays in one area (localizes). Stiff neck. You have a rash. You are more irritable than usual. You are sleepier or have a harder time waking than usual. You feel weak or dizzy. You feel very thirsty. Get help right away if you have: Any symptoms of severe dehydration. Symptoms of vomiting, such as: You cannot eat or drink without vomiting. Vomiting gets worse or does not go away. Vomit includes blood or green matter (bile). Symptoms that get worse with treatment. A fever. A severe headache. Problems with urination or bowel movements, such as: Diarrhea that gets worse or does not go away. Blood in your stool (feces). This may cause stool to look black and tarry. Not urinating, or urinating only a small amount of very dark urine, within 6-8 hours. Trouble  breathing. These symptoms may represent a serious problem that is an emergency. Do not wait to see if the symptoms will go away. Get medical help right away. Call your local emergency services (911 in the [place] Do not drive yourself to the hospital. Summary Dehydration is a condition in which there is not enough water or other fluids in the body. This happens when a person loses more fluids than he or she takes in. Treatment for this condition depends on how severe it is. Treatment should be started right away. Do not wait until dehydration becomes severe. Drink enough clear fluid to keep your urine pale yellow. If you were told to drink an oral rehydration solution (ORS), finish the ORS first and then start slowly drinking other clear fluids. Take over-the-counter and prescription medicines only as told by your health care provider. Get help right away if you have any symptoms of severe dehydration. This information is not intended to replace advice given to you by your health care provider. Make sure you discuss any questions you have with your health care provider. Document Revised: 08/15/2021 Document Reviewed: 11/19/2018 Elsevier Patient Education  2023 [employer]  

## 2022-06-14 ENCOUNTER — Inpatient Hospital Stay: Payer: Medicare Other

## 2022-06-14 VITALS — BP 171/73 | HR 60 | Temp 97.8°F | Resp 18

## 2022-06-14 DIAGNOSIS — Z5111 Encounter for antineoplastic chemotherapy: Secondary | ICD-10-CM | POA: Diagnosis not present

## 2022-06-14 DIAGNOSIS — Z7189 Other specified counseling: Secondary | ICD-10-CM

## 2022-06-14 DIAGNOSIS — C5701 Malignant neoplasm of right fallopian tube: Secondary | ICD-10-CM

## 2022-06-14 MED ORDER — HEPARIN SOD (PORK) LOCK FLUSH 100 UNIT/ML IV SOLN
500.0000 [IU] | Freq: Once | INTRAVENOUS | Status: AC | PRN
  Administered 2022-06-14: 500 [IU]

## 2022-06-14 MED ORDER — SODIUM CHLORIDE 0.9% FLUSH
10.0000 mL | Freq: Once | INTRAVENOUS | Status: AC | PRN
  Administered 2022-06-14: 10 mL

## 2022-06-14 MED ORDER — SODIUM CHLORIDE 0.9 % IV SOLN: Freq: Once | INTRAVENOUS | Status: AC

## 2022-06-14 NOTE — Patient Instructions (Signed)

## 2022-06-15 ENCOUNTER — Inpatient Hospital Stay: Payer: Medicare Other

## 2022-06-17 ENCOUNTER — Inpatient Hospital Stay (HOSPITAL_COMMUNITY)
Admission: EM | Admit: 2022-06-17 | Discharge: 2022-06-20 | DRG: 640 | Disposition: A | Discharge status: Home / Self Care | Payer: Medicare Other | Attending: Internal Medicine | Admitting: Internal Medicine

## 2022-06-17 ENCOUNTER — Emergency Department (HOSPITAL_COMMUNITY): Payer: Medicare Other

## 2022-06-17 ENCOUNTER — Other Ambulatory Visit: Payer: Self-pay

## 2022-06-17 ENCOUNTER — Encounter (HOSPITAL_COMMUNITY): Payer: Self-pay | Admitting: Emergency Medicine

## 2022-06-17 ENCOUNTER — Telehealth: Payer: Self-pay

## 2022-06-17 DIAGNOSIS — E440 Moderate protein-calorie malnutrition: Secondary | ICD-10-CM | POA: Insufficient documentation

## 2022-06-17 DIAGNOSIS — Z79899 Other long term (current) drug therapy: Secondary | ICD-10-CM

## 2022-06-17 DIAGNOSIS — E871 Hypo-osmolality and hyponatremia: Secondary | ICD-10-CM | POA: Diagnosis present

## 2022-06-17 DIAGNOSIS — Z808 Family history of malignant neoplasm of other organs or systems: Secondary | ICD-10-CM

## 2022-06-17 DIAGNOSIS — R5081 Fever presenting with conditions classified elsewhere: Secondary | ICD-10-CM | POA: Diagnosis present

## 2022-06-17 DIAGNOSIS — Z820 Family history of epilepsy and other diseases of the nervous system: Secondary | ICD-10-CM

## 2022-06-17 DIAGNOSIS — Z833 Family history of diabetes mellitus: Secondary | ICD-10-CM

## 2022-06-17 DIAGNOSIS — B9620 Unspecified Escherichia coli [E. coli] as the cause of diseases classified elsewhere: Secondary | ICD-10-CM | POA: Diagnosis present

## 2022-06-17 DIAGNOSIS — R3989 Other symptoms and signs involving the genitourinary system: Secondary | ICD-10-CM

## 2022-06-17 DIAGNOSIS — Z885 Allergy status to narcotic agent status: Secondary | ICD-10-CM

## 2022-06-17 DIAGNOSIS — F419 Anxiety disorder, unspecified: Secondary | ICD-10-CM | POA: Diagnosis present

## 2022-06-17 DIAGNOSIS — D701 Agranulocytosis secondary to cancer chemotherapy: Secondary | ICD-10-CM

## 2022-06-17 DIAGNOSIS — R531 Weakness: Secondary | ICD-10-CM

## 2022-06-17 DIAGNOSIS — K1231 Oral mucositis (ulcerative) due to antineoplastic therapy: Secondary | ICD-10-CM | POA: Diagnosis present

## 2022-06-17 DIAGNOSIS — D708 Other neutropenia: Secondary | ICD-10-CM | POA: Diagnosis not present

## 2022-06-17 DIAGNOSIS — E860 Dehydration: Principal | ICD-10-CM | POA: Diagnosis present

## 2022-06-17 DIAGNOSIS — T451X5A Adverse effect of antineoplastic and immunosuppressive drugs, initial encounter: Secondary | ICD-10-CM | POA: Diagnosis present

## 2022-06-17 DIAGNOSIS — E8809 Other disorders of plasma-protein metabolism, not elsewhere classified: Secondary | ICD-10-CM | POA: Diagnosis present

## 2022-06-17 DIAGNOSIS — N179 Acute kidney failure, unspecified: Secondary | ICD-10-CM | POA: Diagnosis present

## 2022-06-17 DIAGNOSIS — K219 Gastro-esophageal reflux disease without esophagitis: Secondary | ICD-10-CM | POA: Diagnosis present

## 2022-06-17 DIAGNOSIS — C5700 Malignant neoplasm of unspecified fallopian tube: Secondary | ICD-10-CM | POA: Diagnosis present

## 2022-06-17 DIAGNOSIS — Z8543 Personal history of malignant neoplasm of ovary: Secondary | ICD-10-CM

## 2022-06-17 DIAGNOSIS — Z6824 Body mass index (BMI) 24.0-24.9, adult: Secondary | ICD-10-CM

## 2022-06-17 DIAGNOSIS — G620 Drug-induced polyneuropathy: Secondary | ICD-10-CM | POA: Diagnosis present

## 2022-06-17 DIAGNOSIS — E872 Acidosis, unspecified: Secondary | ICD-10-CM | POA: Diagnosis present

## 2022-06-17 DIAGNOSIS — D61818 Other pancytopenia: Secondary | ICD-10-CM

## 2022-06-17 DIAGNOSIS — I10 Essential (primary) hypertension: Secondary | ICD-10-CM | POA: Diagnosis present

## 2022-06-17 DIAGNOSIS — N390 Urinary tract infection, site not specified: Secondary | ICD-10-CM | POA: Diagnosis present

## 2022-06-17 DIAGNOSIS — Z90710 Acquired absence of both cervix and uterus: Secondary | ICD-10-CM

## 2022-06-17 DIAGNOSIS — Z8249 Family history of ischemic heart disease and other diseases of the circulatory system: Secondary | ICD-10-CM

## 2022-06-17 DIAGNOSIS — R197 Diarrhea, unspecified: Secondary | ICD-10-CM | POA: Diagnosis present

## 2022-06-17 DIAGNOSIS — Z825 Family history of asthma and other chronic lower respiratory diseases: Secondary | ICD-10-CM

## 2022-06-17 DIAGNOSIS — Z823 Family history of stroke: Secondary | ICD-10-CM

## 2022-06-17 DIAGNOSIS — N183 Chronic kidney disease, stage 3 unspecified: Secondary | ICD-10-CM

## 2022-06-17 DIAGNOSIS — R946 Abnormal results of thyroid function studies: Secondary | ICD-10-CM | POA: Diagnosis present

## 2022-06-17 DIAGNOSIS — D61810 Antineoplastic chemotherapy induced pancytopenia: Secondary | ICD-10-CM | POA: Diagnosis present

## 2022-06-17 DIAGNOSIS — F32A Depression, unspecified: Secondary | ICD-10-CM | POA: Diagnosis present

## 2022-06-17 DIAGNOSIS — Z888 Allergy status to other drugs, medicaments and biological substances status: Secondary | ICD-10-CM

## 2022-06-17 LAB — COMPREHENSIVE METABOLIC PANEL
ALT: 15 U/L (ref 0–44)
AST: 35 U/L (ref 15–41)
Albumin: 3.3 g/dL — ABNORMAL LOW (ref 3.5–5.0)
Alkaline Phosphatase: 52 U/L (ref 38–126)
Anion gap: 10 (ref 5–15)
BUN: 26 mg/dL — ABNORMAL HIGH (ref 8–23)
CO2: 21 mmol/L — ABNORMAL LOW (ref 22–32)
Calcium: 8.4 mg/dL — ABNORMAL LOW (ref 8.9–10.3)
Chloride: 94 mmol/L — ABNORMAL LOW (ref 98–111)
Creatinine, Ser: 1.2 mg/dL — ABNORMAL HIGH (ref 0.44–1.00)
GFR, Estimated: 50 mL/min — ABNORMAL LOW (ref >60)
Glucose, Bld: 122 mg/dL — ABNORMAL HIGH (ref 70–99)
Potassium: 3.7 mmol/L (ref 3.5–5.1)
Sodium: 125 mmol/L — ABNORMAL LOW (ref 135–145)
Total Bilirubin: 0.6 mg/dL (ref 0.3–1.2)
Total Protein: 7.1 g/dL (ref 6.5–8.1)

## 2022-06-17 LAB — CBC WITH DIFFERENTIAL/PLATELET
Abs Immature Granulocytes: 0.02 10*3/uL (ref 0.00–0.07)
Basophils Absolute: 0 10*3/uL (ref 0.0–0.1)
Basophils Relative: 1 %
Eosinophils Absolute: 0 10*3/uL (ref 0.0–0.5)
Eosinophils Relative: 2 %
HCT: 23.9 % — ABNORMAL LOW (ref 36.0–46.0)
Hemoglobin: 7.7 g/dL — ABNORMAL LOW (ref 12.0–15.0)
Immature Granulocytes: 2 %
Lymphocytes Relative: 22 %
Lymphs Abs: 0.3 10*3/uL — ABNORMAL LOW (ref 0.7–4.0)
MCH: 31.7 pg (ref 26.0–34.0)
MCHC: 32.2 g/dL (ref 30.0–36.0)
MCV: 98.4 fL (ref 80.0–100.0)
Monocytes Absolute: 0.2 10*3/uL (ref 0.1–1.0)
Monocytes Relative: 13 %
Neutro Abs: 0.8 10*3/uL — ABNORMAL LOW (ref 1.7–7.7)
Neutrophils Relative %: 60 %
Platelets: 84 10*3/uL — ABNORMAL LOW (ref 150–400)
RBC: 2.43 MIL/uL — ABNORMAL LOW (ref 3.87–5.11)
RDW: 16.9 % — ABNORMAL HIGH (ref 11.5–15.5)
WBC: 1.3 10*3/uL — CL (ref 4.0–10.5)
nRBC: 0 % (ref 0.0–0.2)

## 2022-06-17 LAB — URINALYSIS, ROUTINE W REFLEX MICROSCOPIC
Bilirubin Urine: NEGATIVE
Glucose, UA: NEGATIVE mg/dL
Ketones, ur: NEGATIVE mg/dL
Nitrite: NEGATIVE
Protein, ur: NEGATIVE mg/dL
Specific Gravity, Urine: 1.009 (ref 1.005–1.030)
WBC, UA: >50 WBC/hpf (ref 0–5)
pH: 5 (ref 5.0–8.0)

## 2022-06-17 LAB — CBG MONITORING, ED: Glucose-Capillary: 106 mg/dL — ABNORMAL HIGH (ref 70–99)

## 2022-06-17 MED ORDER — ATENOLOL 50 MG PO TABS
25.0000 mg | ORAL_TABLET | Freq: Every day | ORAL | Status: DC
  Administered 2022-06-18 – 2022-06-20 (×3): 25 mg via ORAL
  Filled 2022-06-17 (×3): qty 1

## 2022-06-17 MED ORDER — LEVALBUTEROL HCL 0.63 MG/3ML IN NEBU: 0.6300 mg | INHALATION_SOLUTION | Freq: Four times a day (QID) | RESPIRATORY_TRACT | Status: DC | PRN

## 2022-06-17 MED ORDER — SODIUM CHLORIDE 0.9 % IV SOLN
1.0000 g | Freq: Once | INTRAVENOUS | Status: AC
  Administered 2022-06-17: 1 g via INTRAVENOUS
  Filled 2022-06-17: qty 10

## 2022-06-17 MED ORDER — ACETAMINOPHEN 650 MG RE SUPP: 650.0000 mg | Freq: Four times a day (QID) | RECTAL | Status: DC | PRN

## 2022-06-17 MED ORDER — ONDANSETRON HCL 4 MG/2ML IJ SOLN
4.0000 mg | Freq: Four times a day (QID) | INTRAMUSCULAR | Status: DC | PRN
  Administered 2022-06-18: 4 mg via INTRAVENOUS
  Filled 2022-06-17: qty 2

## 2022-06-17 MED ORDER — SODIUM CHLORIDE 0.9 % IV BOLUS (SEPSIS)
1000.0000 mL | Freq: Once | INTRAVENOUS | Status: AC
  Administered 2022-06-17: 1000 mL via INTRAVENOUS

## 2022-06-17 MED ORDER — OXYCODONE HCL 5 MG PO TABS: 5.0000 mg | ORAL_TABLET | ORAL | Status: DC | PRN

## 2022-06-17 MED ORDER — LISINOPRIL 10 MG PO TABS
20.0000 mg | ORAL_TABLET | Freq: Every evening | ORAL | Status: DC
  Administered 2022-06-17 – 2022-06-19 (×3): 20 mg via ORAL
  Filled 2022-06-17 (×3): qty 2

## 2022-06-17 MED ORDER — TBO-FILGRASTIM 300 MCG/0.5ML ~~LOC~~ SOSY
300.0000 ug | PREFILLED_SYRINGE | Freq: Every day | SUBCUTANEOUS | Status: DC
  Administered 2022-06-17 – 2022-06-19 (×3): 300 ug via SUBCUTANEOUS
  Filled 2022-06-17 (×3): qty 0.5

## 2022-06-17 MED ORDER — VENLAFAXINE HCL ER 37.5 MG PO CP24
37.5000 mg | ORAL_CAPSULE | Freq: Every day | ORAL | Status: DC
  Administered 2022-06-18 – 2022-06-20 (×3): 37.5 mg via ORAL
  Filled 2022-06-17 (×3): qty 1

## 2022-06-17 MED ORDER — ACETAMINOPHEN 325 MG PO TABS: 650.0000 mg | ORAL_TABLET | Freq: Four times a day (QID) | ORAL | Status: DC | PRN

## 2022-06-17 MED ORDER — SODIUM CHLORIDE 0.9 % IV SOLN: INTRAVENOUS | Status: DC

## 2022-06-17 MED ORDER — HEPARIN SODIUM (PORCINE) 5000 UNIT/ML IJ SOLN
5000.0000 [IU] | Freq: Three times a day (TID) | INTRAMUSCULAR | Status: DC
  Administered 2022-06-18: 5000 [IU] via SUBCUTANEOUS
  Filled 2022-06-17 (×2): qty 1

## 2022-06-17 MED ORDER — FENTANYL CITRATE PF 50 MCG/ML IJ SOSY: 12.5000 ug | PREFILLED_SYRINGE | INTRAMUSCULAR | Status: DC | PRN

## 2022-06-17 MED ORDER — MAGIC MOUTHWASH W/LIDOCAINE
5.0000 mL | Freq: Four times a day (QID) | ORAL | Status: DC
  Administered 2022-06-17 – 2022-06-18 (×5): 5 mL via ORAL
  Filled 2022-06-17 (×8): qty 5

## 2022-06-17 MED ORDER — FLUCONAZOLE 100 MG PO TABS
100.0000 mg | ORAL_TABLET | Freq: Every day | ORAL | Status: DC
  Administered 2022-06-18 – 2022-06-20 (×3): 100 mg via ORAL
  Filled 2022-06-17 (×3): qty 1

## 2022-06-17 MED ORDER — ONDANSETRON HCL 4 MG PO TABS: 4.0000 mg | ORAL_TABLET | Freq: Four times a day (QID) | ORAL | Status: DC | PRN

## 2022-06-17 MED ORDER — SODIUM CHLORIDE 0.9 % IV SOLN
1000.0000 mL | INTRAVENOUS | Status: DC
  Administered 2022-06-18: 1000 mL via INTRAVENOUS

## 2022-06-17 MED ORDER — HYDRALAZINE HCL 20 MG/ML IJ SOLN
10.0000 mg | Freq: Four times a day (QID) | INTRAMUSCULAR | Status: DC | PRN
  Administered 2022-06-18: 10 mg via INTRAVENOUS
  Filled 2022-06-17: qty 1

## 2022-06-17 NOTE — ED Notes (Signed)
ED TO INPATIENT HANDOFF REPORT  ED Nurse Name and Phone #: Markevion Lattin EMT-P   S Name/Age/Gender Holly [name] 67 y.o. female Room/Bed: WA18/WA18  Code Status   Code Status: Prior  Home/SNF/Other Home Patient oriented to: self, place, time, and situation Is this baseline? Yes   Triage Complete: Triage complete  Chief Complaint Dehydration [E86.0]  Triage Note Patient arrives ambulatory by POV c/o weakness since chemotherapy last Monday. Patients husband states the chemo treatment before caused sore in her mouth causing her to eat less. Patient also reports an odor and pain when urinating with an episode of diarrhea this morning.    Allergies Allergies  Allergen Reactions   Dilaudid [Hydromorphone Hcl] Nausea And Vomiting   Morphine And Related Nausea Only   Sudafed [Pseudoephedrine Hcl] Other (See Comments)    Nervous/jittery    Level of Care/Admitting Diagnosis ED Disposition     ED Disposition  Admit   Condition  --   Comment  Hospital Area: [place] [[id number]]  Level of Care: Med-Surg [16]  May place patient in observation at [facility] or [place] [facility] if equivalent level of care is available:: Yes  Covid Evaluation: Asymptomatic - no recent exposure (last 10 days) testing not required  Diagnosis: Dehydration [276.51.ICD-9-CM]  Admitting Physician: Raiford Noble LATIF [id number]  Attending Physician: Raiford Noble LATIF [id number]          B Medical/Surgery History Past Medical History:  Diagnosis Date   Complication of anesthesia    slow to wake up sometimes    GERD (gastroesophageal reflux disease)    History of chemotherapy    History of hiatal hernia    Hypertension    Ovarian cancer ([place]) dx'd 12/2016   recurrent dz 02/2019   PONV (postoperative nausea and vomiting)    Past Surgical History:  Procedure Laterality Date   ABDOMINAL HYSTERECTOMY  2018   DEBULKING N/A 12/26/2016   Procedure: RADICAL TUMOR DEBULKING;  OMENTECTOMY;  Surgeon: Everitt Amber, MD;  Location: [facility];  Service: Gynecology;  Laterality: N/A;   ganglion cyst removed     Hital Hernia     IR FLUORO GUIDE PORT INSERTION RIGHT  01/16/2017   IR IMAGING GUIDED PORT INSERTION  03/16/2019   IR REMOVAL TUN ACCESS W/ PORT W/O FL MOD SED  06/10/2017   IR US GUIDE VASC ACCESS RIGHT  01/16/2017   LAPAROTOMY N/A 12/26/2016   Procedure: EXPLORATORY LAPAROTOMY;  Surgeon: Everitt Amber, MD;  Location: [facility];  Service: Gynecology;  Laterality: N/A;   left morton's neuroma surgery      Nissan Fundoplication       A IV Location/Drains/Wounds Patient Lines/Drains/Airways Status     Active Line/Drains/Airways     Name Placement date Placement time Site Days   Implanted Port 03/16/19 Right Chest 03/16/19  1424  Chest  1189   Peripheral IV 06/17/22 22 G 1" Anterior;Right Hand 06/17/22  1200  Hand  less than 1   Incision - 4 Ports Abdomen Umbilicus Left;Lateral Right;Upper;Lateral Right;Lower;Lateral 03/07/22  0957  -- 102            Intake/Output Last 24 hours No intake or output data in the 24 hours ending 06/17/22 1619  Labs/Imaging Results for orders placed or performed during the hospital encounter of 06/17/22 (from the past 48 hour(s))  CBG monitoring, ED     Status: Abnormal   Collection Time: 06/17/22 11:15 AM  Result Value Ref Range   Glucose-Capillary 106 (H) 70 -  99 mg/dL    Comment: Glucose reference range applies only to samples taken after fasting for at least 8 hours.  Comprehensive metabolic panel     Status: Abnormal   Collection Time: 06/17/22 11:36 AM  Result Value Ref Range   Sodium 125 (L) 135 - 145 mmol/L   Potassium 3.7 3.5 - 5.1 mmol/L   Chloride 94 (L) 98 - 111 mmol/L   CO2 21 (L) 22 - 32 mmol/L   Glucose, Bld 122 (H) 70 - 99 mg/dL    Comment: Glucose reference range applies only to samples taken after fasting for at least 8 hours.   BUN 26 (H) 8 - 23 mg/dL   Creatinine, Ser 1.20 (H) 0.44 - 1.00 mg/dL    Calcium 8.4 (L) 8.9 - 10.3 mg/dL   Total Protein 7.1 6.5 - 8.1 g/dL   Albumin 3.3 (L) 3.5 - 5.0 g/dL   AST 35 15 - 41 U/L   ALT 15 0 - 44 U/L   Alkaline Phosphatase 52 38 - 126 U/L   Total Bilirubin 0.6 0.3 - 1.2 mg/dL   GFR, Estimated 50 (L) >60 mL/min    Comment: (NOTE) Calculated using the CKD-EPI Creatinine Equation (2021)    Anion gap 10 5 - 15    Comment: Performed at [facility], [place] [address], [place], [place] [address]  CBC with Differential/Platelet     Status: Abnormal   Collection Time: 06/17/22  2:48 PM  Result Value Ref Range   WBC 1.3 (LL) 4.0 - 10.5 K/uL    Comment: WHITE COUNT CONFIRMED ON SMEAR THIS CRITICAL RESULT HAS VERIFIED AND BEEN CALLED TO RN P DUNLAP BY ALEXIS CRUICKSHANK ON 02 26 2024 AT 1540, AND HAS BEEN READ BACK.     RBC 2.43 (L) 3.87 - 5.11 MIL/uL   Hemoglobin 7.7 (L) 12.0 - 15.0 g/dL   HCT 23.9 (L) 36.0 - 46.0 %   MCV 98.4 80.0 - 100.0 fL   MCH 31.7 26.0 - 34.0 pg   MCHC 32.2 30.0 - 36.0 g/dL   RDW 16.9 (H) 11.5 - 15.5 %   Platelets 84 (L) 150 - 400 K/uL    Comment: SPECIMEN CHECKED FOR CLOTS Immature Platelet Fraction may be clinically indicated, consider ordering this additional test [id number] REPEATED TO VERIFY PLATELET COUNT CONFIRMED BY SMEAR    nRBC 0.0 0.0 - 0.2 %   Neutrophils Relative % 60 %   Neutro Abs 0.8 (L) 1.7 - 7.7 K/uL   Lymphocytes Relative 22 %   Lymphs Abs 0.3 (L) 0.7 - 4.0 K/uL   Monocytes Relative 13 %   Monocytes Absolute 0.2 0.1 - 1.0 K/uL   Eosinophils Relative 2 %   Eosinophils Absolute 0.0 0.0 - 0.5 K/uL   Basophils Relative 1 %   Basophils Absolute 0.0 0.0 - 0.1 K/uL   Immature Granulocytes 2 %   Abs Immature Granulocytes 0.02 0.00 - 0.07 K/uL   Tear Drop Cells PRESENT     Comment: Performed at [facility], [place] [address], [place], [place] [address]  Urinalysis, Routine w reflex microscopic -Urine, Clean Catch     Status: Abnormal   Collection Time: 06/17/22  3:31  PM  Result Value Ref Range   Color, Urine YELLOW YELLOW   APPearance HAZY (A) CLEAR   Specific Gravity, Urine 1.009 1.005 - 1.030   pH 5.0 5.0 - 8.0   Glucose, UA NEGATIVE NEGATIVE mg/dL   Hgb urine dipstick MODERATE (A) NEGATIVE  Bilirubin Urine NEGATIVE NEGATIVE   Ketones, ur NEGATIVE NEGATIVE mg/dL   Protein, ur NEGATIVE NEGATIVE mg/dL   Nitrite NEGATIVE NEGATIVE   Leukocytes,Ua LARGE (A) NEGATIVE   RBC / HPF 0-5 0 - 5 RBC/hpf   WBC, UA >50 0 - 5 WBC/hpf   Bacteria, UA MANY (A) NONE SEEN   Squamous Epithelial / HPF 0-5 0 - 5 /HPF   WBC Clumps PRESENT    Mucus PRESENT     Comment: Performed at [facility], [place] [address], [place], [place] [address]   DG Chest 2 View  Result Date: 06/17/2022 CLINICAL DATA:  Dyspnea on exertion. EXAM: CHEST - 2 VIEW COMPARISON:  None Available. FINDINGS: The heart size and mediastinal contours are within normal limits. Both lungs are clear. Right internal jugular Port-A-Cath is noted. The visualized skeletal structures are unremarkable. IMPRESSION: No active cardiopulmonary disease. Electronically Signed   By: Marijo Conception M.D.   On: 06/17/2022 12:53    Pending Labs Unresulted Labs (From admission, onward)     Start     Ordered   06/18/22 0500  CBC with Differential/Platelet  Daily,   R      06/17/22 1616   06/17/22 1613  Blood culture (routine x 2)  BLOOD CULTURE X 2,   R      06/17/22 1612   06/17/22 1549  Urine Culture (for pregnant, neutropenic or urologic patients or patients with an indwelling urinary catheter)  (Urine Labs)  Add-on,   AD       Question:  Indication  Answer:  Dysuria   06/17/22 1548   06/17/22 1300  CBC with Differential/Platelet  Once,   R        06/17/22 1300   06/17/22 1136  CBC with Differential  Once,   STAT        06/17/22 1136            Vitals/Pain Today's Vitals   06/17/22 1445 06/17/22 1500 06/17/22 1515 06/17/22 1520  BP: (!) 164/76 (!) 183/71 (!) 180/73   Pulse: 66 63 64    Resp: '14 16 16   '$ Temp:    99.5 F (37.5 C)  TempSrc:    Oral  SpO2: 100% 100% 99%   Weight:      Height:      PainSc:        Isolation Precautions No active isolations  Medications Medications  sodium chloride 0.9 % bolus 1,000 mL (1,000 mLs Intravenous New Bag/Given 06/17/22 1227)    Followed by  0.9 %  sodium chloride infusion (has no administration in time range)  cefTRIAXone (ROCEPHIN) 1 g in sodium chloride 0.9 % 100 mL IVPB (has no administration in time range)  Tbo-Filgrastim (GRANIX) injection 300 mcg (has no administration in time range)    Mobility walks     Focused Assessments     R Recommendations: See Admitting Provider Note  Report given to:   Additional Notes:

## 2022-06-17 NOTE — ED Provider Notes (Signed)
[place] [facility] Provider Note   CSN: [id number] Arrival date & time: 06/17/22  1103     History  Chief Complaint  Patient presents with   Weakness    Holly [name] is a 67 y.o. female.   Weakness    Patient has a history of hypertension fallopian tube adenocarcinoma, neuropathy due to chemotherapy, pancytopenia due to chemotherapy, and mucositis due to chemotherapy.  Patient has been having issues with her chemotherapy recently.  She has not been eating or drinking as much due to poor appetite.  Patient previously had issues with mucositis but that has improved.  Patient states she is not feeling nauseated right now but really has had not much of an appetite.  She was seen in the oncology office on the 22nd as well as the 23rd when she was given IV fluids.  Patient was post to go back on Saturday but she did not sleep well and was not feeling well enough to go.  She called the oncology office this morning and they instructed her to come to the ED for evaluation.  She denies any fevers or chills.  She is not feeling any chest pain or shortness of breath.  She does get very fatigued however if she tries to walk even short distances.  She has noted some odor to her urine and wonders if she could have an infection.  Home Medications Prior to Admission medications   Medication Sig Start Date End Date Taking? Authorizing Provider  acetaminophen (TYLENOL) 325 MG tablet Take 650 mg by mouth every 6 (six) hours as needed for headache (pain).    [provider]  acetaminophen (TYLENOL) 500 MG tablet Take 2 tablets (1,000 mg total) by mouth every 6 (six) hours as needed for mild pain. 03/07/22   Piscoya, Jacqulyn Bath, MD  atenolol (TENORMIN) 25 MG tablet TAKE 1 TABLET BY MOUTH EVERY DAY 04/30/22   Heath Lark, MD  cloNIDine (CATAPRES) 0.1 MG tablet TAKE 1 TABLET BY MOUTH EVERY DAY Patient not taking: Reported on 05/20/2022 02/04/22   Heath Lark, MD   dexamethasone (DECADRON) 4 MG tablet Take 2 tabs at the night before and 2 tab the morning of chemotherapy, every 3 weeks, by mouth x 6 cycles 03/28/22   Heath Lark, MD  doxazosin (CARDURA) 4 MG tablet TAKE 1 TABLET BY MOUTH EVERY DAY Patient not taking: Reported on 05/20/2022 04/21/22   Heath Lark, MD  fluconazole (DIFLUCAN) 100 MG tablet Take 1 tablet (100 mg total) by mouth daily. 05/23/22   Heath Lark, MD  hydrochlorothiazide (HYDRODIURIL) 25 MG tablet TAKE 1 TABLET (25 MG TOTAL) BY MOUTH DAILY. Patient not taking: Reported on 05/20/2022 02/04/22   Heath Lark, MD  lidocaine-prilocaine (EMLA) cream Apply to affected area once daily as directed. Patient taking differently: 1 application  as needed (access port). Apply to affected area once daily as directed. 09/25/20   Heath Lark, MD  lisinopril (ZESTRIL) 20 MG tablet TAKE 1 TABLET BY MOUTH EVERY DAY IN THE EVENING Patient taking differently: Take 20 mg by mouth every evening. Restarted 05/23/22 05/02/22   Heath Lark, MD  magic mouthwash (nystatin, lidocaine, diphenhydrAMINE, alum & mag hydroxide) suspension Swish and spit 5 mls by mouth 4 times a day 05/20/22   Heath Lark, MD  magic mouthwash w/lidocaine SOLN Take 5 mLs by mouth 4 (four) times daily. Swish and spit. 05/20/22   Heath Lark, MD  ondansetron (ZOFRAN) 8 MG tablet Take 1 tablet (8  mg total) by mouth every 8 (eight) hours as needed for nausea or vomiting. Start on the third day after chemotherapy. 03/28/22   Heath Lark, MD  oxyCODONE (OXY IR/ROXICODONE) 5 MG immediate release tablet Take 1 tablet by mouth every 4 hours as needed for severe pain. 06/03/22   Heath Lark, MD  prochlorperazine (COMPAZINE) 10 MG tablet Take 1 tablet (10 mg total) by mouth every 6 (six) hours as needed for nausea or vomiting. 03/28/22   Heath Lark, MD  venlafaxine XR (EFFEXOR-XR) 37.5 MG 24 hr capsule TAKE 1 CAPSULE BY MOUTH DAILY WITH BREAKFAST. 06/10/22   Heath Lark, MD      Allergies    Dilaudid  [hydromorphone hcl], Morphine and related, and Sudafed [pseudoephedrine hcl]    Review of Systems   Review of Systems  Neurological:  Positive for weakness.    Physical Exam Updated Vital Signs BP 133/74   Pulse 68   Temp 98 F (36.7 C) (Oral)   Resp 18   Ht 1.651 m ('5\' 5"'$ )   Wt 65.8 kg   SpO2 100%   BMI 24.13 kg/m  Physical Exam Vitals and nursing note reviewed.  Constitutional:      Appearance: She is well-developed. She is not diaphoretic.  HENT:     Head: Normocephalic and atraumatic.     Right Ear: External ear normal.     Left Ear: External ear normal.     Mouth/Throat:     Mouth: Mucous membranes are dry.     Comments: No lesions noted in the oropharynx Eyes:     General: No scleral icterus.       Right eye: No discharge.        Left eye: No discharge.     Conjunctiva/sclera: Conjunctivae normal.  Neck:     Trachea: No tracheal deviation.  Cardiovascular:     Rate and Rhythm: Normal rate and regular rhythm.  Pulmonary:     Effort: Pulmonary effort is normal. No respiratory distress.     Breath sounds: Normal breath sounds. No stridor. No wheezing or rales.  Abdominal:     General: Bowel sounds are normal. There is no distension.     Palpations: Abdomen is soft.     Tenderness: There is no abdominal tenderness. There is no guarding or rebound.  Musculoskeletal:        General: No tenderness or deformity.     Cervical back: Neck supple.  Skin:    General: Skin is warm and dry.     Findings: No rash.  Neurological:     General: No focal deficit present.     Mental Status: She is alert.     Cranial Nerves: No cranial nerve deficit, dysarthria or facial asymmetry.     Sensory: No sensory deficit.     Motor: No abnormal muscle tone or seizure activity.     Coordination: Coordination normal.  Psychiatric:        Mood and Affect: Mood normal.     ED Results / Procedures / Treatments   Labs (all labs ordered are listed, but only abnormal results are  displayed) Labs Reviewed  CBG MONITORING, ED - Abnormal; Notable for the following components:      Result Value   Glucose-Capillary 106 (*)    All other components within normal limits  CBC WITH DIFFERENTIAL/PLATELET  URINALYSIS, ROUTINE W REFLEX MICROSCOPIC  COMPREHENSIVE METABOLIC PANEL    EKG EKG Interpretation  Date/Time:  Monday June 17 2022 11:13:53 EST  Ventricular Rate:  67 PR Interval:  135 QRS Duration: 97 QT Interval:  409 QTC Calculation: 432 R Axis:   -3 Text Interpretation: Sinus or ectopic atrial rhythm Low voltage, precordial leads Consider anterior infarct No significant change since last tracing Confirmed by Dorie Rank [phone]) on 06/17/2022 11:26:37 AM  Radiology No results found.  Procedures Procedures    Medications Ordered in ED Medications  sodium chloride 0.9 % bolus 1,000 mL (has no administration in time range)    Followed by  0.9 %  sodium chloride infusion (has no administration in time range)    ED Course/ Medical Decision Making/ A&P Clinical Course as of 06/17/22 1846  Mon Jun 17, 2022  1314 Comprehensive metabolic panel(!) Labs show elevated BUN/creatinine, hyponatremia [JK]    Clinical Course User Index [JK] Dorie Rank, MD                             Medical Decision Making Problems Addressed: Dehydration: acute illness or injury Hyponatremia: acute illness or injury  Amount and/or Complexity of Data Reviewed Labs: ordered. Decision-making details documented in ED Course. Radiology: ordered.  Risk Prescription drug management. Decision regarding hospitalization.   Pt presented with increasing weakness, known adenocarcinoma undergoing chemotherapy.  Pt has been treated with IV fluids,twice the prior week.  Continued worsening weakness, decreased po intake.  Also concerned about possible uti with change in urine.  Metabolic panel shows hyponatermia, worsening aki.  Pt treated with IV fluids. Will need admission to the  hospital.  CBC still pending at shift change.  Pt at risk for leukopenia, anemia.  Care turned over to Dr Langston Masker pending UA and CBC.        Final Clinical Impression(s) / ED Diagnoses Final diagnoses:  None    Rx / DC Orders ED Discharge Orders     None         Dorie Rank, MD 06/17/22 1850

## 2022-06-17 NOTE — H&P (Signed)
History and Physical    Patient: Holly [name] [id number] DOB: 1955-11-05 DOA: 06/17/2022 DOS: the patient was seen and examined on 06/17/2022 PCP: Mayra Neer, MD  Patient coming from: Home  Chief Complaint:  Chief Complaint  Patient presents with   Weakness   HPI: Holly [name] is a 67 y.o. female with medical history significant for but not limited to hypertension, GERD, history of fallopian tubes adenocarcinoma with recurrent disease, history of postoperative nausea vomiting as well as other comorbidities who presents with extreme generalized weakness and fatigue that is progressively gotten worse the last few days.  Patient states that she received chemotherapy last Monday and since then she has progressively gotten weaker and states to the point where she cannot even get out of bed or move.  She has not been eating very well given her mouth lesions and was supposed to go to the cancer center on Saturday for an IV fluid infusion.  She has been getting daily IV fluid infusions for 10 days prior to her her last chemotherapy dose and given her weakness she could not make it to the cancer center appointment.  She has been complaining of some malodorous and discomfort when she urinates and states that this has happened since her last chemotherapy.  She denies any chest pain, shortness of breath, lightheadedness or dizziness but states that she had 2 episodes of loose stools this morning.  She denies any nausea or vomiting and admits to having a very poor p.o. appetite and being extremely weak.  Presented to the ED and was found to have a suspected UTI and dehydration so tried hospitalist was called to admit this patient for further monitoring and medical oncology has been consulted given her pancytopenia.  Review of Systems: As mentioned in the history of present illness. All other systems reviewed and are negative. Past Medical History:  Diagnosis Date   Complication of anesthesia    slow  to wake up sometimes    GERD (gastroesophageal reflux disease)    History of chemotherapy    History of hiatal hernia    Hypertension    Ovarian cancer ([place]) dx'd 12/2016   recurrent dz 02/2019   PONV (postoperative nausea and vomiting)    Past Surgical History:  Procedure Laterality Date   ABDOMINAL HYSTERECTOMY  2018   DEBULKING N/A 12/26/2016   Procedure: RADICAL TUMOR DEBULKING; OMENTECTOMY;  Surgeon: Everitt Amber, MD;  Location: [facility];  Service: Gynecology;  Laterality: N/A;   ganglion cyst removed     Hital Hernia     IR FLUORO GUIDE PORT INSERTION RIGHT  01/16/2017   IR IMAGING GUIDED PORT INSERTION  03/16/2019   IR REMOVAL TUN ACCESS W/ PORT W/O FL MOD SED  06/10/2017   IR US GUIDE VASC ACCESS RIGHT  01/16/2017   LAPAROTOMY N/A 12/26/2016   Procedure: EXPLORATORY LAPAROTOMY;  Surgeon: Everitt Amber, MD;  Location: [facility];  Service: Gynecology;  Laterality: N/A;   left morton's neuroma surgery      Nissan Fundoplication     Social History:  reports that she has never smoked. She has been exposed to tobacco smoke. She has never used smokeless tobacco. She reports that she does not drink alcohol and does not use drugs.  Allergies  Allergen Reactions   Dilaudid [Hydromorphone Hcl] Nausea And Vomiting   Morphine And Related Nausea Only   Sudafed [Pseudoephedrine Hcl] Other (See Comments)    Nervous/jittery    Family History  Problem Relation Age of  Onset   Diabetes Mother    Diabetes Father    Hypertension Father    Stroke Father    COPD Father    Heart attack Sister 41   Dementia Maternal Aunt    Bone cancer Paternal Uncle        deceased at 68   Dementia Maternal Grandmother    Heart attack Paternal Grandmother    Heart attack Paternal 33    Healthy Son    Healthy Son     Prior to Admission medications   Medication Sig Start Date End Date Taking? Authorizing Provider  acetaminophen (TYLENOL) 325 MG tablet Take 650 mg by mouth every 6 (six) hours as  needed for headache (pain).    [provider]  acetaminophen (TYLENOL) 500 MG tablet Take 2 tablets (1,000 mg total) by mouth every 6 (six) hours as needed for mild pain. 03/07/22   Piscoya, Jacqulyn Bath, MD  atenolol (TENORMIN) 25 MG tablet TAKE 1 TABLET BY MOUTH EVERY DAY 04/30/22   Heath Lark, MD  cloNIDine (CATAPRES) 0.1 MG tablet TAKE 1 TABLET BY MOUTH EVERY DAY Patient not taking: Reported on 05/20/2022 02/04/22   Heath Lark, MD  dexamethasone (DECADRON) 4 MG tablet Take 2 tabs at the night before and 2 tab the morning of chemotherapy, every 3 weeks, by mouth x 6 cycles 03/28/22   Heath Lark, MD  doxazosin (CARDURA) 4 MG tablet TAKE 1 TABLET BY MOUTH EVERY DAY Patient not taking: Reported on 05/20/2022 04/21/22   Heath Lark, MD  fluconazole (DIFLUCAN) 100 MG tablet Take 1 tablet (100 mg total) by mouth daily. 05/23/22   Heath Lark, MD  hydrochlorothiazide (HYDRODIURIL) 25 MG tablet TAKE 1 TABLET (25 MG TOTAL) BY MOUTH DAILY. Patient not taking: Reported on 05/20/2022 02/04/22   Heath Lark, MD  lidocaine-prilocaine (EMLA) cream Apply to affected area once daily as directed. Patient taking differently: 1 application  as needed (access port). Apply to affected area once daily as directed. 09/25/20   Heath Lark, MD  lisinopril (ZESTRIL) 20 MG tablet TAKE 1 TABLET BY MOUTH EVERY DAY IN THE EVENING Patient taking differently: Take 20 mg by mouth every evening. Restarted 05/23/22 05/02/22   Heath Lark, MD  magic mouthwash (nystatin, lidocaine, diphenhydrAMINE, alum & mag hydroxide) suspension Swish and spit 5 mls by mouth 4 times a day 05/20/22   Heath Lark, MD  magic mouthwash w/lidocaine SOLN Take 5 mLs by mouth 4 (four) times daily. Swish and spit. 05/20/22   Heath Lark, MD  ondansetron (ZOFRAN) 8 MG tablet Take 1 tablet (8 mg total) by mouth every 8 (eight) hours as needed for nausea or vomiting. Start on the third day after chemotherapy. 03/28/22   Heath Lark, MD  oxyCODONE (OXY IR/ROXICODONE) 5  MG immediate release tablet Take 1 tablet by mouth every 4 hours as needed for severe pain. 06/03/22   Heath Lark, MD  prochlorperazine (COMPAZINE) 10 MG tablet Take 1 tablet (10 mg total) by mouth every 6 (six) hours as needed for nausea or vomiting. 03/28/22   Heath Lark, MD  venlafaxine XR (EFFEXOR-XR) 37.5 MG 24 hr capsule TAKE 1 CAPSULE BY MOUTH DAILY WITH BREAKFAST. 06/10/22   Heath Lark, MD   Physical Exam: Vitals:   06/17/22 1445 06/17/22 1500 06/17/22 1515 06/17/22 1520  BP: (!) 164/76 (!) 183/71 (!) 180/73   Pulse: 66 63 64   Resp: '14 16 16   '$ Temp:    99.5 F (37.5 C)  TempSrc:    Oral  SpO2: 100% 100% 99%   Weight:      Height:       Examination: Physical Exam:  Constitutional: WN/WD chronically ill appearing Caucasian female and appears significantly weak Respiratory: Diminished to auscultation bilaterally, no wheezing, rales, rhonchi or crackles. Normal respiratory effort and patient is not tachypenic. No accessory muscle use. Unlabored breathing  Cardiovascular: RRR, no murmurs / rubs / gallops. S1 and S2 auscultated. No extremity edema.  Abdomen: Soft, non-tender, non-distended. Bowel sounds positive.  GU: Deferred. Musculoskeletal: No clubbing / cyanosis of digits/nails. No joint deformity upper and lower extremities.  Skin: No rashes, lesions, ulcers on a limited skin evaluation. No induration; Warm and dry.  Neurologic: CN 2-12 grossly intact with no focal deficits. Romberg sign and cerebellar reflexes not assessed.  Psychiatric: Normal judgment and insight. Alert and oriented x 3. Normal mood and appropriate affect.   Data Reviewed: Recent Results (from the past 2160 hour(s))  CBC with Differential ([place] Only)     Status: Abnormal   Collection Time: 04/08/22  8:00 AM  Result Value Ref Range   WBC Count 10.7 (H) 4.0 - 10.5 K/uL   RBC 3.90 3.87 - 5.11 MIL/uL   Hemoglobin 12.4 12.0 - 15.0 g/dL   HCT 37.9 36.0 - 46.0 %   MCV 97.2 80.0 - 100.0 fL   MCH  31.8 26.0 - 34.0 pg   MCHC 32.7 30.0 - 36.0 g/dL   RDW 14.6 11.5 - 15.5 %   Platelet Count 190 150 - 400 K/uL   nRBC 0.0 0.0 - 0.2 %   Neutrophils Relative % 67 %   Neutro Abs 7.2 1.7 - 7.7 K/uL   Lymphocytes Relative 13 %   Lymphs Abs 1.4 0.7 - 4.0 K/uL   Monocytes Relative 8 %   Monocytes Absolute 0.8 0.1 - 1.0 K/uL   Eosinophils Relative 10 %   Eosinophils Absolute 1.1 (H) 0.0 - 0.5 K/uL   Basophils Relative 1 %   Basophils Absolute 0.1 0.0 - 0.1 K/uL   Immature Granulocytes 1 %   Abs Immature Granulocytes 0.08 (H) 0.00 - 0.07 K/uL    Comment: Performed at [facility], [address], [place], [place] [address]  CMP ([place] only)     Status: Abnormal   Collection Time: 04/08/22  8:00 AM  Result Value Ref Range   Sodium 136 135 - 145 mmol/L   Potassium 4.5 3.5 - 5.1 mmol/L   Chloride 104 98 - 111 mmol/L   CO2 24 22 - 32 mmol/L   Glucose, Bld 203 (H) 70 - 99 mg/dL    Comment: Glucose reference range applies only to samples taken after fasting for at least 8 hours.   BUN 26 (H) 8 - 23 mg/dL   Creatinine 1.56 (H) 0.44 - 1.00 mg/dL   Calcium 9.0 8.9 - 10.3 mg/dL   Total Protein 5.7 (L) 6.5 - 8.1 g/dL   Albumin 3.5 3.5 - 5.0 g/dL   AST 14 (L) 15 - 41 U/L   ALT 8 0 - 44 U/L   Alkaline Phosphatase 46 38 - 126 U/L   Total Bilirubin 0.4 0.3 - 1.2 mg/dL   GFR, Estimated 36 (L) >60 mL/min    Comment: (NOTE) Calculated using the CKD-EPI Creatinine Equation (2021)    Anion gap 8 5 - 15    Comment: Performed at [facility], [place] [address], [place], [place] [address]  [place] [phone]  Status: Abnormal   Collection Time: 04/08/22  8:00 AM  Result Value Ref Range   Cancer Antigen ([place]) 125 86.7 (H) 0.0 - 38.1 U/mL    Comment: (NOTE) Roche [employer]ostics Electrochemiluminescence Immunoassay (ECLIA) Values obtained with different assay methods or kits cannot be used interchangeably.  Results cannot be interpreted as absolute evidence  of the presence or absence of malignant disease. Performed At: [facility] [place], [place] [id number] Rush Farmer MD [id number]   CBC with Differential ([place] Only)     Status: Abnormal   Collection Time: 04/29/22  8:02 AM  Result Value Ref Range   WBC Count 8.5 4.0 - 10.5 K/uL   RBC 3.51 (L) 3.87 - 5.11 MIL/uL   Hemoglobin 11.1 (L) 12.0 - 15.0 g/dL   HCT 33.4 (L) 36.0 - 46.0 %   MCV 95.2 80.0 - 100.0 fL   MCH 31.6 26.0 - 34.0 pg   MCHC 33.2 30.0 - 36.0 g/dL   RDW 15.7 (H) 11.5 - 15.5 %   Platelet Count 186 150 - 400 K/uL   nRBC 0.0 0.0 - 0.2 %   Neutrophils Relative % 87 %   Neutro Abs 7.4 1.7 - 7.7 K/uL   Lymphocytes Relative 8 %   Lymphs Abs 0.7 0.7 - 4.0 K/uL   Monocytes Relative 1 %   Monocytes Absolute 0.1 0.1 - 1.0 K/uL   Eosinophils Relative 0 %   Eosinophils Absolute 0.0 0.0 - 0.5 K/uL   Basophils Relative 1 %   Basophils Absolute 0.0 0.0 - 0.1 K/uL   Immature Granulocytes 3 %   Abs Immature Granulocytes 0.26 (H) 0.00 - 0.07 K/uL    Comment: Performed at [facility], [place] [address], [place], [place] [address]  CMP ([place] only)     Status: Abnormal   Collection Time: 04/29/22  8:02 AM  Result Value Ref Range   Sodium 135 135 - 145 mmol/L   Potassium 4.5 3.5 - 5.1 mmol/L   Chloride 103 98 - 111 mmol/L   CO2 25 22 - 32 mmol/L   Glucose, Bld 226 (H) 70 - 99 mg/dL    Comment: Glucose reference range applies only to samples taken after fasting for at least 8 hours.   BUN 14 8 - 23 mg/dL   Creatinine 1.14 (H) 0.44 - 1.00 mg/dL   Calcium 9.1 8.9 - 10.3 mg/dL   Total Protein 5.6 (L) 6.5 - 8.1 g/dL   Albumin 3.5 3.5 - 5.0 g/dL   AST 15 15 - 41 U/L   ALT 8 0 - 44 U/L   Alkaline Phosphatase 63 38 - 126 U/L   Total Bilirubin 0.5 0.3 - 1.2 mg/dL   GFR, Estimated 53 (L) >60 mL/min    Comment: (NOTE) Calculated using the CKD-EPI Creatinine Equation (2021)    Anion gap 7 5 - 15    Comment: Performed  at [facility], [place] [address] [place], [place] [address]  Total Protein, Urine dipstick     Status: None   Collection Time: 04/29/22  8:02 AM  Result Value Ref Range   Protein, ur NEGATIVE NEGATIVE mg/dL    Comment: Performed at [facility], [place] [address], [place], [place] [address]  Total Protein, Urine dipstick     Status: None   Collection Time: 05/20/22  7:57 AM  Result Value Ref Range   Protein, ur NEGATIVE NEGATIVE mg/dL    Comment: Performed  at [facility], [place] [address] [place], [place] [address]  CMP ([place] only)     Status: Abnormal   Collection Time: 05/20/22  7:57 AM  Result Value Ref Range   Sodium 132 (L) 135 - 145 mmol/L   Potassium 4.1 3.5 - 5.1 mmol/L   Chloride 103 98 - 111 mmol/L   CO2 21 (L) 22 - 32 mmol/L   Glucose, Bld 145 (H) 70 - 99 mg/dL    Comment: Glucose reference range applies only to samples taken after fasting for at least 8 hours.   BUN 18 8 - 23 mg/dL   Creatinine 1.10 (H) 0.44 - 1.00 mg/dL   Calcium 8.6 (L) 8.9 - 10.3 mg/dL   Total Protein 6.2 (L) 6.5 - 8.1 g/dL   Albumin 3.2 (L) 3.5 - 5.0 g/dL   AST 14 (L) 15 - 41 U/L   ALT 10 0 - 44 U/L   Alkaline Phosphatase 52 38 - 126 U/L   Total Bilirubin 0.5 0.3 - 1.2 mg/dL   GFR, Estimated 55 (L) >60 mL/min    Comment: (NOTE) Calculated using the CKD-EPI Creatinine Equation (2021)    Anion gap 8 5 - 15    Comment: Performed at [facility], [place] [address], [place], [place] [address]  CBC with Differential ([place] Only)     Status: Abnormal   Collection Time: 05/20/22  7:57 AM  Result Value Ref Range   WBC Count 10.5 4.0 - 10.5 K/uL   RBC 3.21 (L) 3.87 - 5.11 MIL/uL   Hemoglobin 10.3 (L) 12.0 - 15.0 g/dL   HCT 30.9 (L) 36.0 - 46.0 %   MCV 96.3 80.0 - 100.0 fL   MCH 32.1 26.0 - 34.0 pg   MCHC 33.3 30.0 - 36.0 g/dL   RDW 17.4 (H) 11.5 - 15.5 %   Platelet Count 229 150 - 400 K/uL    nRBC 0.0 0.0 - 0.2 %   Neutrophils Relative % 74 %   Neutro Abs 7.8 (H) 1.7 - 7.7 K/uL   Lymphocytes Relative 10 %   Lymphs Abs 1.1 0.7 - 4.0 K/uL   Monocytes Relative 10 %   Monocytes Absolute 1.0 0.1 - 1.0 K/uL   Eosinophils Relative 1 %   Eosinophils Absolute 0.2 0.0 - 0.5 K/uL   Basophils Relative 1 %   Basophils Absolute 0.1 0.0 - 0.1 K/uL   Immature Granulocytes 4 %   Abs Immature Granulocytes 0.38 (H) 0.00 - 0.07 K/uL    Comment: Performed at [facility], [place] [address], [place],  [address]  [place] 125     Status: Abnormal   Collection Time: 05/20/22  7:57 AM  Result Value Ref Range   Cancer Antigen ([place]) 125 49.7 (H) 0.0 - 38.1 U/mL    Comment: (NOTE) Roche [employer]ostics Electrochemiluminescence Immunoassay (ECLIA) Values obtained with different assay methods or kits cannot be used interchangeably.  Results cannot be interpreted as absolute evidence of the presence or absence of malignant disease. Performed At: [facility] [place], [place] [id number] Rush Farmer MD [id number]   CBC with Differential ([place] Only)     Status: Abnormal   Collection Time: 05/27/22 12:28 PM  Result Value Ref Range   WBC Count 3.7 (L) 4.0 - 10.5 K/uL   RBC 2.97 (L) 3.87 - 5.11 MIL/uL   Hemoglobin 9.6 (L) 12.0 - 15.0 g/dL   HCT 28.7 (L) 36.0 -  46.0 %   MCV 96.6 80.0 - 100.0 fL   MCH 32.3 26.0 - 34.0 pg   MCHC 33.4 30.0 - 36.0 g/dL   RDW 16.3 (H) 11.5 - 15.5 %   Platelet Count 190 150 - 400 K/uL   nRBC 0.0 0.0 - 0.2 %   Neutrophils Relative % 70 %   Neutro Abs 2.6 1.7 - 7.7 K/uL   Lymphocytes Relative 22 %   Lymphs Abs 0.8 0.7 - 4.0 K/uL   Monocytes Relative 2 %   Monocytes Absolute 0.1 0.1 - 1.0 K/uL   Eosinophils Relative 4 %   Eosinophils Absolute 0.1 0.0 - 0.5 K/uL   Basophils Relative 1 %   Basophils Absolute 0.0 0.0 - 0.1 K/uL   Immature Granulocytes 1 %   Abs Immature Granulocytes 0.05 0.00 - 0.07 K/uL     Comment: Performed at [facility], [place] [address], [place], [place] [address]  CMP ([place] only)     Status: Abnormal   Collection Time: 05/27/22 12:28 PM  Result Value Ref Range   Sodium 135 135 - 145 mmol/L   Potassium 4.8 3.5 - 5.1 mmol/L   Chloride 109 98 - 111 mmol/L   CO2 19 (L) 22 - 32 mmol/L   Glucose, Bld 117 (H) 70 - 99 mg/dL    Comment: Glucose reference range applies only to samples taken after fasting for at least 8 hours.   BUN 33 (H) 8 - 23 mg/dL   Creatinine 1.38 (H) 0.44 - 1.00 mg/dL   Calcium 8.9 8.9 - 10.3 mg/dL   Total Protein 5.9 (L) 6.5 - 8.1 g/dL   Albumin 3.3 (L) 3.5 - 5.0 g/dL   AST 15 15 - 41 U/L   ALT 10 0 - 44 U/L   Alkaline Phosphatase 50 38 - 126 U/L   Total Bilirubin 0.5 0.3 - 1.2 mg/dL   GFR, Estimated 42 (L) >60 mL/min    Comment: (NOTE) Calculated using the CKD-EPI Creatinine Equation (2021)    Anion gap 7 5 - 15    Comment: Performed at [facility], [place] [address], [place], [place] [address]  CBC with Differential ([place] Only)     Status: Abnormal   Collection Time: 06/10/22  8:11 AM  Result Value Ref Range   WBC Count 7.7 4.0 - 10.5 K/uL   RBC 3.22 (L) 3.87 - 5.11 MIL/uL   Hemoglobin 10.2 (L) 12.0 - 15.0 g/dL   HCT 31.4 (L) 36.0 - 46.0 %   MCV 97.5 80.0 - 100.0 fL   MCH 31.7 26.0 - 34.0 pg   MCHC 32.5 30.0 - 36.0 g/dL   RDW 17.5 (H) 11.5 - 15.5 %   Platelet Count 193 150 - 400 K/uL   nRBC 0.0 0.0 - 0.2 %   Neutrophils Relative % 86 %   Neutro Abs 6.6 1.7 - 7.7 K/uL   Lymphocytes Relative 10 %   Lymphs Abs 0.7 0.7 - 4.0 K/uL   Monocytes Relative 3 %   Monocytes Absolute 0.2 0.1 - 1.0 K/uL   Eosinophils Relative 0 %   Eosinophils Absolute 0.0 0.0 - 0.5 K/uL   Basophils Relative 0 %   Basophils Absolute 0.0 0.0 - 0.1 K/uL   Immature Granulocytes 1 %   Abs Immature Granulocytes 0.09 (H) 0.00 - 0.07 K/uL    Comment: Performed at [facility], [place] [address] [place], [place] [address]  CMP (  [place] only)     Status: Abnormal   Collection Time: 06/10/22  8:11 AM  Result Value Ref Range   Sodium 132 (L) 135 - 145 mmol/L   Potassium 4.2 3.5 - 5.1 mmol/L   Chloride 101 98 - 111 mmol/L   CO2 22 22 - 32 mmol/L   Glucose, Bld 230 (H) 70 - 99 mg/dL    Comment: Glucose reference range applies only to samples taken after fasting for at least 8 hours.   BUN 14 8 - 23 mg/dL   Creatinine 0.98 0.44 - 1.00 mg/dL   Calcium 8.1 (L) 8.9 - 10.3 mg/dL   Total Protein 6.1 (L) 6.5 - 8.1 g/dL   Albumin 3.4 (L) 3.5 - 5.0 g/dL   AST 13 (L) 15 - 41 U/L   ALT 6 0 - 44 U/L   Alkaline Phosphatase 61 38 - 126 U/L   Total Bilirubin 0.7 0.3 - 1.2 mg/dL   GFR, Estimated >60 >60 mL/min    Comment: (NOTE) Calculated using the CKD-EPI Creatinine Equation (2021)    Anion gap 9 5 - 15    Comment: Performed at [facility], [place] [address], [place], [place] [address]  CBG monitoring, ED     Status: Abnormal   Collection Time: 06/17/22 11:15 AM  Result Value Ref Range   Glucose-Capillary 106 (H) 70 - 99 mg/dL    Comment: Glucose reference range applies only to samples taken after fasting for at least 8 hours.  Comprehensive metabolic panel     Status: Abnormal   Collection Time: 06/17/22 11:36 AM  Result Value Ref Range   Sodium 125 (L) 135 - 145 mmol/L   Potassium 3.7 3.5 - 5.1 mmol/L   Chloride 94 (L) 98 - 111 mmol/L   CO2 21 (L) 22 - 32 mmol/L   Glucose, Bld 122 (H) 70 - 99 mg/dL    Comment: Glucose reference range applies only to samples taken after fasting for at least 8 hours.   BUN 26 (H) 8 - 23 mg/dL   Creatinine, Ser 1.20 (H) 0.44 - 1.00 mg/dL   Calcium 8.4 (L) 8.9 - 10.3 mg/dL   Total Protein 7.1 6.5 - 8.1 g/dL   Albumin 3.3 (L) 3.5 - 5.0 g/dL   AST 35 15 - 41 U/L   ALT 15 0 - 44 U/L   Alkaline Phosphatase 52 38 - 126 U/L   Total Bilirubin 0.6 0.3 - 1.2 mg/dL   GFR, Estimated 50 (L) >60 mL/min    Comment:  (NOTE) Calculated using the CKD-EPI Creatinine Equation (2021)    Anion gap 10 5 - 15    Comment: Performed at [facility], [place] [address], [place], [place] [address]  CBC with Differential/Platelet     Status: Abnormal   Collection Time: 06/17/22  2:48 PM  Result Value Ref Range   WBC 1.3 (LL) 4.0 - 10.5 K/uL    Comment: WHITE COUNT CONFIRMED ON SMEAR THIS CRITICAL RESULT HAS VERIFIED AND BEEN CALLED TO RN P DUNLAP BY ALEXIS CRUICKSHANK ON 02 26 2024 AT 1540, AND HAS BEEN READ BACK.     RBC 2.43 (L) 3.87 - 5.11 MIL/uL   Hemoglobin 7.7 (L) 12.0 - 15.0 g/dL   HCT 23.9 (L) 36.0 - 46.0 %   MCV 98.4 80.0 - 100.0 fL   MCH 31.7 26.0 - 34.0 pg   MCHC 32.2 30.0 - 36.0 g/dL   RDW 16.9 (H) 11.5 - 15.5 %  Platelets 84 (L) 150 - 400 K/uL    Comment: SPECIMEN CHECKED FOR CLOTS Immature Platelet Fraction may be clinically indicated, consider ordering this additional test [id number] REPEATED TO VERIFY PLATELET COUNT CONFIRMED BY SMEAR    nRBC 0.0 0.0 - 0.2 %   Neutrophils Relative % 60 %   Neutro Abs 0.8 (L) 1.7 - 7.7 K/uL   Lymphocytes Relative 22 %   Lymphs Abs 0.3 (L) 0.7 - 4.0 K/uL   Monocytes Relative 13 %   Monocytes Absolute 0.2 0.1 - 1.0 K/uL   Eosinophils Relative 2 %   Eosinophils Absolute 0.0 0.0 - 0.5 K/uL   Basophils Relative 1 %   Basophils Absolute 0.0 0.0 - 0.1 K/uL   Immature Granulocytes 2 %   Abs Immature Granulocytes 0.02 0.00 - 0.07 K/uL   Tear Drop Cells PRESENT     Comment: Performed at [facility], [place] [address], [place], [place] [address]  Urinalysis, Routine w reflex microscopic -Urine, Clean Catch     Status: Abnormal   Collection Time: 06/17/22  3:31 PM  Result Value Ref Range   Color, Urine YELLOW YELLOW   APPearance HAZY (A) CLEAR   Specific Gravity, Urine 1.009 1.005 - 1.030   pH 5.0 5.0 - 8.0   Glucose, UA NEGATIVE NEGATIVE mg/dL   Hgb urine dipstick MODERATE (A) NEGATIVE   Bilirubin Urine NEGATIVE NEGATIVE    Ketones, ur NEGATIVE NEGATIVE mg/dL   Protein, ur NEGATIVE NEGATIVE mg/dL   Nitrite NEGATIVE NEGATIVE   Leukocytes,Ua LARGE (A) NEGATIVE   RBC / HPF 0-5 0 - 5 RBC/hpf   WBC, UA >50 0 - 5 WBC/hpf   Bacteria, UA MANY (A) NONE SEEN   Squamous Epithelial / HPF 0-5 0 - 5 /HPF   WBC Clumps PRESENT    Mucus PRESENT     Comment: Performed at [facility], [place] [address] [place], [place] [address]   Assessment and Plan: No notes have been filed under this hospital service. Service: Hospitalist  Dehydration -In the setting of poor Po Intake from her recent chemotherapy and her mouth pain from likely mucositis -Start IV fluid hydration and continue with supportive care and antiemetics -She has been getting daily fluids almost prior to her last chemotherapy and received chemotherapy on Monday and could not make it on Saturday for IV fluid hydration given her weakness and worsening  Generalized weakness and physical deconditioning -Obtain PT and OT to further evaluate and treat, check TSH in the a.m. and continue IV fluid hydration as above  AKI Metabolic Acidosis -BUN/Cr Trend: Recent Labs  Lab 05/20/22 0757 05/27/22 1228 06/10/22 0811 06/17/22 1136  BUN 18 33* 14 26*  CREATININE 1.10* 1.38* 0.98 1.20*  -C/w IVF with NS at 100 MLS per hour -Likely a prerenal cause of her AKI and will obtain urine electrolytes, and urine osm but if AKI does not improve will obtain further workup with a renal ultrasound but can start with a bladder scan first to see if she is retaining at all -Avoid Nephrotoxic Medications, Contrast Dyes, Hypotension and Dehydration to Ensure Adequate Renal Perfusion and will need to Renally Adjust Meds -Continue to Monitor and Trend Renal Function carefully and repeat CMP in the AM   Hyponatremia -Na+ Trend: Recent Labs  Lab 05/20/22 0757 05/27/22 1228 06/10/22 0811 06/17/22 1136  NA 132* 135 132* 125*  -C/w IVF Hydration as above -Continue  to Monitor and Trend and repeat CMP in the AM  Suspected UTI -Presents with poor po Intake, Discomfort Urinating and Malodorous Urine -U/A done and showed a hazy appearance with moderate hemoglobin, large leukocytes, negative nitrite, many bacteria, present mucus, 0-5 RBCs per high-power field, 0-5 squamous epithelial cells, greater than 50 WBCs with urine culture and blood cultures pending -C/w IVF Hydration with normal saline at 100 MLS per hour -Continue with empiric antibiotics with IV ceftriaxone  Mucositis -Continue Magic mouthwash with lidocaine and fluconazole 100 mg p.o. daily  Loose Stools and Diarrhea -Check GI pathogen panel as well as C. difficile  HTN -C/w Home Medications with Atenolol 25 mg po Daily and Lisinopril 20 mg po Daily  -Continue to Monitor BP per Protocol and add IV hydralazine 10 mg every 6 as needed for systolic blood pressure in [id number] or diastolic blood pressure in 100 -Last BP reading was elevated at 180/73  Depression and Anxiety -C/w Venlafaxine XR 37.5 mg po Daily   Pancytopenia -CBC Trend: Recent Labs  Lab 05/20/22 0757 05/27/22 1228 06/10/22 0811 06/17/22 1448  WBC 10.5 3.7* 7.7 1.3*  HGB 10.3* 9.6* 10.2* 7.7*  HCT 30.9* 28.7* 31.4* 23.9*  MCV 96.3 96.6 97.5 98.4  PLT 229 190 193 84*  -Patient is getting get Granix tonight by oncology -Her pancytopenia is chemotherapy induced given that she just had chemotherapy last Monday -Continue to monitor for signs and symptoms of bleeding; no overt bleeding noted -Continue to monitor and trend and repeat CBC in the a.m.  Hypoalbuminemia -Patient's Albumin Trend: Recent Labs  Lab 05/20/22 0757 05/27/22 1228 06/10/22 0811 06/17/22 1136  ALBUMIN 3.2* 3.3* 3.4* 3.3*  -Continue to Monitor and Trend and repeat CMP in the AM  Advance Care Planning:   Code Status: Prior FULL   Consults: Medical Oncology   Family Communication: No family present at bedside   Severity of Illness: The  appropriate patient status for this patient is OBSERVATION. Observation status is judged to be reasonable and necessary in order to provide the required intensity of service to ensure the patient's safety. The patient's presenting symptoms, physical exam findings, and initial radiographic and laboratory data in the context of their medical condition is felt to place them at decreased risk for further clinical deterioration. Furthermore, it is anticipated that the patient will be medically stable for discharge from the hospital within 2 midnights of admission.   Author: Raiford Noble, DO [facility]   06/17/2022 4:15 PM  For on call review [email or web address].

## 2022-06-17 NOTE — Telephone Encounter (Signed)
Husband and Holly [name] called back. She canceled IV fluids on Saturday due to not feeling well sick and not sleeping Friday night. She is complaining of being  very weak. She has a strong odor in her urine and is concerned. She did not eat anything Friday and Saturday. She has not eaten anything today but will try to eat something. Above message given to Dr. Alvy Bimler. Instructed [name] to go now to the the ER to be evaluated. She verbalized understanding.

## 2022-06-17 NOTE — Telephone Encounter (Signed)
Called and left below message. Ask her to call the office back. 

## 2022-06-17 NOTE — ED Notes (Signed)
Pt blood recollect is hemolyzed. Will recollect

## 2022-06-17 NOTE — Telephone Encounter (Signed)
-----   Message from Heath Lark, MD sent at 06/17/2022  7:44 AM EST ----- Can you call and ask how she is doing and whether she needs more IVF Also ask her BP control

## 2022-06-17 NOTE — Telephone Encounter (Signed)
Called back and left another message asking her to call the office back.

## 2022-06-17 NOTE — ED Notes (Signed)
Patient transported to X-ray 

## 2022-06-17 NOTE — ED Triage Notes (Signed)
Patient arrives ambulatory by POV c/o weakness since chemotherapy last Monday. Patients husband states the chemo treatment before caused sore in her mouth causing her to eat less. Patient also reports an odor and pain when urinating with an episode of diarrhea this morning.

## 2022-06-17 NOTE — ED Provider Notes (Signed)
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 Decision-making details documented in ED Course. Radiology: ordered.  Risk Prescription drug management.   I spoke to Dr Alvy Bimler who reports patient had been receiving IV fluids nearly daily through the infusion centered, but feeling even worse over the weekend.  She feels that the pancytopenia is likely secondary to her oncology treatments.  She will come consult on the patient and agrees with hospitalist admission and antibiotics for UTI.  Will page hospitalist.       Wyvonnia Dusky, MD 06/17/22 [phone]

## 2022-06-17 NOTE — Progress Notes (Signed)
Holly [name]   DOB:07-02-1955   [id number]    ASSESSMENT & PLAN:  Recurrent fallopian tube cancer Continue supportive care for now Her next chemotherapy is not due for another 2 weeks  Acquired pancytopenia Due to recent chemotherapy She does not need transfusion support today I will start her on G-CSF daily until [facility] is greater than 1  Severe dehydration, urinary tract infection Urine culture is pending I agree with IV antibiotics  Possible stye/eyelid infection Observe only She will be getting IV antibiotics  Chronic kidney disease, hyponatremia, uncontrolled hypertension Agree with IV fluid resuscitation Continue intermittent antihypertensive treatment  Discharge planning She will likely be here 2 to 3 days  All questions were answered. The patient knows to call the clinic with any problems, questions or concerns.   The total time spent in the appointment was 55 minutes encounter with patients including review of chart and various tests results, discussions about plan of care and coordination of care plan  Heath Lark, MD 06/17/2022 4:23 PM  Subjective:  Patient well-known to me.  She has been receiving IV fluids last week in the outpatient clinic.  Over the weekend, she felt awful.  She canceled her IV fluids appointment on Saturday She has virtually no oral intake over the weekend She felt very weak and she was brought in to the emergency department for evaluation She is being admitted due to pancytopenia and UTI along with dehydration She denies nausea vomiting  Objective:  Vitals:   06/17/22 1515 06/17/22 1520  BP: (!) 180/73   Pulse: 64   Resp: 16   Temp:  99.5 F (37.5 C)  SpO2: 99%     No intake or output data in the 24 hours ending 06/17/22 1623  GENERAL:alert, no distress and comfortable.  She looks pale SKIN: Noted dry mucous membrane.  EYES: Possible stye infection affecting the right eye OROPHARYNX: Noted dry mucous membrane, no thrush NECK:  supple, thyroid normal size, non-tender, without nodularity LYMPH:  no palpable lymphadenopathy in the cervical, axillary or inguinal LUNGS: clear to auscultation and percussion with normal breathing effort HEART: regular rate & rhythm and no murmurs and no lower extremity edema ABDOMEN:abdomen soft, non-tender and normal bowel sounds Musculoskeletal:no cyanosis of digits and no clubbing  NEURO: alert & oriented x 3 with fluent speech, no focal motor/sensory deficits   Labs:  Recent Labs    05/27/22 1228 06/10/22 0811 06/17/22 1136  NA 135 132* 125*  K 4.8 4.2 3.7  CL 109 101 94*  CO2 19* 22 21*  GLUCOSE 117* 230* 122*  BUN 33* 14 26*  CREATININE 1.38* 0.98 1.20*  CALCIUM 8.9 8.1* 8.4*  GFRNONAA 42* >60 50*  PROT 5.9* 6.1* 7.1  ALBUMIN 3.3* 3.4* 3.3*  AST 15 13* 35  ALT '10 6 15  '$ ALKPHOS 50 61 52  BILITOT 0.5 0.7 0.6    Studies:  DG Chest 2 View  Result Date: 06/17/2022 CLINICAL DATA:  Dyspnea on exertion. EXAM: CHEST - 2 VIEW COMPARISON:  None Available. FINDINGS: The heart size and mediastinal contours are within normal limits. Both lungs are clear. Right internal jugular Port-A-Cath is noted. The visualized skeletal structures are unremarkable. IMPRESSION: No active cardiopulmonary disease. Electronically Signed   By: Marijo Conception M.D.   On: 06/17/2022 12:53   CT ABDOMEN PELVIS W CONTRAST  Result Date: 06/05/2022 CLINICAL DATA:  Assess treatment response with chemotherapy for ovarian cancer. Shortness of breath on exertion. * Tracking Code: BO * EXAM:  CT ABDOMEN AND PELVIS WITH CONTRAST TECHNIQUE: Multidetector CT imaging of the abdomen and pelvis was performed using the standard protocol following bolus administration of intravenous contrast. RADIATION DOSE REDUCTION: This exam was performed according to the departmental dose-optimization program which includes automated exposure control, adjustment of the mA and/or kV according to patient size and/or use of iterative  reconstruction technique. CONTRAST:  91m OMNIPAQUE IOHEXOL 300 MG/ML  SOLN COMPARISON:  CT 04/04/2022 and older FINDINGS: Lower chest: New tiny bilateral pleural effusions are identified. Once again there are several small cavitary lesions in the lungs. Many of these are becoming more cavitary and less soft tissue elements and are smaller. Specific exams will be followed. The 7 mm lesion on the prior in the right lower lobe, today on series 6, image 30 measures 6 mm. Again much less soft tissue component. Lesion just above this in the right lower lobe which measured 7 mm as well, today on image 19 of series 6 again 6 mm but much less soft tissue component. Small hiatal hernia. No pre cardiophrenic abnormal nodes. Hepatobiliary: Fatty liver infiltration identified. Contracted gallbladder. Persistent biliary duct ectasia. The thickening and calcifications seen along the margin of the falciform ligament anteriorly are stable today on image 20 of series 2. The surface implants are improving. Focus anterior to the left hepatic lobe superiorly which had measured 18 x 11 mm on the prior, today on series 2, image 14 measures 13 by 7 mm. Please see sagittal series 5 image 52. Additional focus posteriorly in segment 7 on image 18 of series 2 is stable. There is a rind of soft tissue along the lateral margin of the liver extending towards the dome. Pancreas: Global atrophy without enhancing mass or ductal dilatation. Spleen: Spleen is slightly enlarged with cephalocaudal length of 13.1 cm, similar in retrospect when adjusting for technique. Adrenals/Urinary Tract: Adrenal glands are preserved. Extrarenal pelvis seen of the right kidney and left. No enhancing renal mass or collecting system filling defect. Bosniak 1 exophytic left-sided renal cyst is stable. Of interest the density of the lumen of the renal pelvis bilaterally is increased on the left compared to right in the portal venous phase. This is of uncertain etiology  and significance. Please correlate with the urinalysis. The ureters have normal course and caliber down to the bladder which is with preserved contour. The bladder is underdistended. Stomach/Bowel: Diffuse colonic stool. Large bowel is nondilated. There is rectus muscle diastasis with defect at the umbilicus with herniation of loops of large bowel and small bowel without obstruction. This is a wide mouth hernia and is unchanged from previous. The small bowel is well is nondilated. Stomach is relatively collapsed. Vascular/Lymphatic: Diffuse vascular calcifications are seen. Preserved course and caliber of the aorta and IVC. Several enlarged nodes are identified. Specific lesions will be followed. This includes upper abdomen periceliac were some of the nodes are enlarged but calcified. Specific lesions will be followed for continuity. The lymph node to the right of the celiac on the prior which measured 20 x 13 mm, today is less well defined but estimated on series 2, image 24 at 19 by 10 mm. The lesion just above this which measured 15 x 10 mm also with some calcification, today on image 23 of series 2 measures 12 x 12 mm. Other retroperitoneal nodes are smaller ill-defined with more stranding but relatively similar in size. Bilateral inguinal nodes are again seen as well with some calcification. The right-sided focus which measured 18 x 14  mm, today on image 78 of series 2 measures 17 by 14 mm. Left-sided node which measured 14 x 17 mm on series 2, image 75 measures 14 x 16 mm. No new nodal enlargement. Reproductive: Status post hysterectomy. No adnexal masses. Other: No developing significant ascites. There is a small cystic focus identified the central mesentery on image 36 of series 2. This appears relatively simple measuring 15 by 12 mm with Hounsfield units of 13. There are some small areas of mesenteric nodularity which appear relatively similar to previous such as on the right side on image 41 measuring 14  by 9 mm, central pelvis image 60 of series 2 measuring 13 by 9 mm. This also could represent prominent nodes. No definite new areas of peritoneal nodularity at this time Musculoskeletal: Mild degenerative changes of the spine and pelvis. Mild skin thickening along the anterior pelvic wall with some anasarca. IMPRESSION: 1. New tiny bilateral pleural effusions. 2. Once again there are several small cavitary lesions in the lungs. Many of these are becoming more cavitary with less soft tissue elements and are smaller today. 3. Soft tissue thickening and nodularity along the surface of the liver appears to be overall decreasing from previous examination. Some of these areas have calcification. 4. Mesenteric nodules as well as retroperitoneal and mesenteric nodes appear similar to slightly improved. The inguinal nodes seen previously are similar. 5. No developing new mass lesion, ascites or lymph node enlargement seen. 6. Of interest the density of the lumen of the renal pelvis bilaterally is increased on the left compared to the right in the portal venous phase. This is of uncertain etiology and significance. Please correlate with the urinalysis such as for hematuria. Electronically Signed   By: Jill Side M.D.   On: 06/05/2022 13:40

## 2022-06-17 NOTE — ED Notes (Signed)
Unable to obtain second set of blood cultures. Verbal order from Dr. Alfredia Ferguson stating that Rocephin can be given.

## 2022-06-17 NOTE — ED Notes (Signed)
Port accessed for repeat blood draw. Lavender tube sent to lab

## 2022-06-18 DIAGNOSIS — N390 Urinary tract infection, site not specified: Secondary | ICD-10-CM | POA: Diagnosis not present

## 2022-06-18 DIAGNOSIS — B9620 Unspecified Escherichia coli [E. coli] as the cause of diseases classified elsewhere: Secondary | ICD-10-CM

## 2022-06-18 DIAGNOSIS — D708 Other neutropenia: Secondary | ICD-10-CM | POA: Diagnosis not present

## 2022-06-18 DIAGNOSIS — E860 Dehydration: Secondary | ICD-10-CM | POA: Diagnosis not present

## 2022-06-18 LAB — TSH: TSH: 9.081 u[IU]/mL — ABNORMAL HIGH (ref 0.350–4.500)

## 2022-06-18 LAB — CBC WITH DIFFERENTIAL/PLATELET
Abs Immature Granulocytes: 0.01 10*3/uL (ref 0.00–0.07)
Basophils Absolute: 0 10*3/uL (ref 0.0–0.1)
Basophils Relative: 1 %
Eosinophils Absolute: 0 10*3/uL (ref 0.0–0.5)
Eosinophils Relative: 2 %
HCT: 23.3 % — ABNORMAL LOW (ref 36.0–46.0)
Hemoglobin: 7.5 g/dL — ABNORMAL LOW (ref 12.0–15.0)
Immature Granulocytes: 1 %
Lymphocytes Relative: 20 %
Lymphs Abs: 0.3 10*3/uL — ABNORMAL LOW (ref 0.7–4.0)
MCH: 31.9 pg (ref 26.0–34.0)
MCHC: 32.2 g/dL (ref 30.0–36.0)
MCV: 99.1 fL (ref 80.0–100.0)
Monocytes Absolute: 0.3 10*3/uL (ref 0.1–1.0)
Monocytes Relative: 22 %
Neutro Abs: 0.8 10*3/uL — ABNORMAL LOW (ref 1.7–7.7)
Neutrophils Relative %: 54 %
Platelets: 69 10*3/uL — ABNORMAL LOW (ref 150–400)
RBC: 2.35 MIL/uL — ABNORMAL LOW (ref 3.87–5.11)
RDW: 16.7 % — ABNORMAL HIGH (ref 11.5–15.5)
WBC: 1.4 10*3/uL — CL (ref 4.0–10.5)
nRBC: 0 % (ref 0.0–0.2)

## 2022-06-18 LAB — MAGNESIUM: Magnesium: 1.4 mg/dL — ABNORMAL LOW (ref 1.7–2.4)

## 2022-06-18 LAB — PHOSPHORUS: Phosphorus: 2.8 mg/dL (ref 2.5–4.6)

## 2022-06-18 LAB — COMPREHENSIVE METABOLIC PANEL
ALT: 19 U/L (ref 0–44)
AST: 36 U/L (ref 15–41)
Albumin: 2.4 g/dL — ABNORMAL LOW (ref 3.5–5.0)
Alkaline Phosphatase: 40 U/L (ref 38–126)
Anion gap: 7 (ref 5–15)
BUN: 21 mg/dL (ref 8–23)
CO2: 21 mmol/L — ABNORMAL LOW (ref 22–32)
Calcium: 7.8 mg/dL — ABNORMAL LOW (ref 8.9–10.3)
Chloride: 103 mmol/L (ref 98–111)
Creatinine, Ser: 1.02 mg/dL — ABNORMAL HIGH (ref 0.44–1.00)
GFR, Estimated: >60 mL/min (ref >60)
Glucose, Bld: 88 mg/dL (ref 70–99)
Potassium: 3.3 mmol/L — ABNORMAL LOW (ref 3.5–5.1)
Sodium: 131 mmol/L — ABNORMAL LOW (ref 135–145)
Total Bilirubin: 0.6 mg/dL (ref 0.3–1.2)
Total Protein: 5 g/dL — ABNORMAL LOW (ref 6.5–8.1)

## 2022-06-18 LAB — C DIFFICILE QUICK SCREEN W PCR REFLEX
C Diff antigen: NEGATIVE
C Diff interpretation: NOT DETECTED
C Diff toxin: NEGATIVE

## 2022-06-18 LAB — HIV ANTIBODY (ROUTINE TESTING W REFLEX): HIV Screen 4th Generation wRfx: NONREACTIVE

## 2022-06-18 LAB — GLUCOSE, CAPILLARY: Glucose-Capillary: 93 mg/dL (ref 70–99)

## 2022-06-18 MED ORDER — CHLORHEXIDINE GLUCONATE CLOTH 2 % EX PADS
6.0000 | MEDICATED_PAD | Freq: Every day | CUTANEOUS | Status: DC
  Administered 2022-06-18 – 2022-06-20 (×3): 6 via TOPICAL

## 2022-06-18 MED ORDER — MAGNESIUM SULFATE 4 GM/100ML IV SOLN
4.0000 g | Freq: Once | INTRAVENOUS | Status: AC
  Administered 2022-06-18: 4 g via INTRAVENOUS
  Filled 2022-06-18: qty 100

## 2022-06-18 MED ORDER — POTASSIUM CHLORIDE CRYS ER 20 MEQ PO TBCR
40.0000 meq | EXTENDED_RELEASE_TABLET | Freq: Two times a day (BID) | ORAL | Status: AC
  Administered 2022-06-18 (×2): 40 meq via ORAL
  Filled 2022-06-18 (×2): qty 2

## 2022-06-18 MED ORDER — BOOST / RESOURCE BREEZE PO LIQD CUSTOM
1.0000 | Freq: Three times a day (TID) | ORAL | Status: DC
  Administered 2022-06-18 – 2022-06-19 (×3): 1 via ORAL

## 2022-06-18 MED ORDER — SODIUM CHLORIDE 0.9 % IV SOLN
1.0000 g | Freq: Every day | INTRAVENOUS | Status: DC
  Administered 2022-06-18 – 2022-06-19 (×2): 1 g via INTRAVENOUS
  Filled 2022-06-18 (×2): qty 10

## 2022-06-18 NOTE — Progress Notes (Signed)
PROGRESS NOTE    Holly [name]  [id number] DOB: 25-Jun-1955 DOA: 06/17/2022 PCP: Mayra Neer, MD   Brief Narrative:  Holly [name] is a 67 y.o. female with medical history significant for but not limited to hypertension, GERD, history of fallopian tubes adenocarcinoma with recurrent disease, history of postoperative nausea vomiting as well as other comorbidities who presents with extreme generalized weakness and fatigue that is progressively gotten worse the last few days.  Patient states that she received chemotherapy last Monday and since then she has progressively gotten weaker and states to the point where she cannot even get out of bed or move.  She has not been eating very well given her mouth lesions and was supposed to go to the cancer center on Saturday for an IV fluid infusion.  She has been getting daily IV fluid infusions for 10 days prior to her her last chemotherapy dose and given her weakness she could not make it to the cancer center appointment.  She has been complaining of some malodorous and discomfort when she urinates and states that this has happened since her last chemotherapy.  She denies any chest pain, shortness of breath, lightheadedness or dizziness but states that she had 2 episodes of loose stools this morning.  She denies any nausea or vomiting and admits to having a very poor p.o. appetite and being extremely weak.  Presented to the ED and was found to have a suspected UTI and dehydration so tried hospitalist was called to admit this patient for further monitoring and medical oncology has been consulted given her pancytopenia.   **Interim History  Patient states she is doing little bit better.  She states that she still has some weakness and felt fatigued.  Electrolytes are soft.  Will continue IV antibiotics with ceftriaxone daily.  Urine cultures growing E. coli with sensitivities pending.  Will check a GI pathogen panel as well as a C. difficile given her continued  loose stools and diarrhea.  PT Recommending no follow-up  Assessment and Plan:  Severe Dehydration -In the setting of poor Po Intake from her recent chemotherapy and her mouth pain from likely mucositis -Start IV fluid hydration and continue with supportive care and antiemetics -She has been getting daily fluids almost prior to her last chemotherapy and received chemotherapy on Monday and could not make it on Saturday for IV fluid hydration given her weakness and worsening -See Below    Generalized weakness and physical deconditioning -Obtain PT and OT to further evaluate and treat -C/w IVF Hydration with NS at 100 mL/hr -TSH was 9.081 so will check Free T4   AKI Metabolic Acidosis -BUN/Cr Trend: Recent Labs  Lab 05/20/22 0757 05/27/22 1228 06/10/22 0811 06/17/22 1136 06/17/22 1136 06/18/22 0624  BUN 18 33* 14 26*  --  21  CREATININE 1.10* 1.38* 0.98 1.20*   < > 1.02*   < > = values in this interval not displayed.  -C/w IVF with NS at 100 MLS per hour -Likely a prerenal cause of her AKI and will obtain urine electrolytes, and urine osm but if AKI does not improve will obtain further workup with a renal ultrasound but can start with a bladder scan first to see if she is retaining at all -Avoid Nephrotoxic Medications, Contrast Dyes, Hypotension and Dehydration to Ensure Adequate Renal Perfusion and will need to Renally Adjust Meds -Continue to Monitor and Trend Renal Function carefully and repeat CMP in the AM    Hyponatremia -Na+ Trend: Recent  Labs  Lab 05/20/22 0757 05/27/22 1228 06/10/22 0811 06/17/22 1136 06/18/22 0624  NA 132* 135 132* 125* 131*  -C/w IVF Hydration as above -Continue to Monitor and Trend and repeat CMP in the AM    E Coli UTI -Presents with poor po Intake, Discomfort Urinating and Malodorous Urine -U/A done and showed a hazy appearance with moderate hemoglobin, large leukocytes, negative nitrite, many bacteria, present mucus, 0-5 RBCs per  high-power field, 0-5 squamous epithelial cells, greater than 50 WBCs with urine culture and blood cultures pending; -Urine culture showing greater than 100,000 coliform units of E. coli with sensitivities pending and blood cultures x 2 -C/w IVF Hydration with normal saline at 100 MLS per hour as above  -Continue with empiric antibiotics with IV ceftriaxone for now    Mucositis -Continue Magic mouthwash with lidocaine and fluconazole 100 mg p.o. daily  Hypokalemia -Patient's K+ Level Trend: Recent Labs  Lab 05/20/22 0757 05/27/22 1228 06/10/22 0811 06/17/22 1136 06/18/22 0624  K 4.1 4.8 4.2 3.7 3.3*  -Replete with po Kcl 40 mEQ BID x2 -Continue to Monitor and Replete as Necessary -Repeat CMP in the AM   Hypomagnesemia -Patient's Mag Level Trend: Recent Labs  Lab 06/18/22 0624  MG 1.4*  -Replete with IV Mag Sulfate 4 grams -Continue to Monitor and Replete as Necessary -Repeat Mag in the AM   Loose Stools and Diarrhea -Check GI pathogen panel as well as C. Difficile -The medical oncology team feels that this could be related to antibiotics and diet related and recommending observing but may need to put her on some probiotics   HTN -C/w Home Medications with Atenolol 25 mg po Daily and Lisinopril 20 mg po Daily  -Continue to Monitor BP per Protocol and add IV hydralazine 10 mg every 6 as needed for systolic blood pressure in [id number] or diastolic blood pressure in 100 -Last BP reading was elevated at 162/64   Depression and Anxiety -C/w Venlafaxine XR 37.5 mg po Daily    Acquired Pancytopenia -CBC Trend: Recent Labs  Lab 05/20/22 0757 05/27/22 1228 06/10/22 0811 06/10/22 0811 06/17/22 1448 06/18/22 0624  WBC 10.5 3.7* 7.7  --  1.3* 1.4*  HGB 10.3* 9.6* 10.2*   < > 7.7* 7.5*  HCT 30.9* 28.7* 31.4*  --  23.9* 23.3*  MCV 96.3 96.6 97.5  --  98.4 99.1  PLT 229 190 193   < > 84* 69*   < > = values in this interval not displayed.  -Patient is getting get Granix again  tonight by oncology -Her pancytopenia is chemotherapy induced given that she just had chemotherapy last Monday -Continue to monitor for signs and symptoms of bleeding; no overt bleeding noted -Continue to monitor and trend and repeat CBC in the a.m.   Hypoalbuminemia -Patient's Albumin Trend: Recent Labs  Lab 05/20/22 0757 05/27/22 1228 06/10/22 0811 06/17/22 1136 06/18/22 0624  ALBUMIN 3.2* 3.3* 3.4* 3.3* 2.4*  -Continue to Monitor and Trend and repeat CMP in the AM  Recurrent fallopian tube adenocarcinoma -Medical oncology has been consulted and they are recommending continue supportive care -Her next chemotherapy is not due for another few weeks  DVT prophylaxis: heparin injection 5,000 Units Start: 06/17/22 2200 SCDs Start: 06/17/22 1704    Code Status: Full Code Family Communication: Discussed with the patient's husband at bedside  Disposition Plan:  Level of care: Med-Surg Status is: Observation The patient will require care spanning > 2 midnights and should be moved to inpatient because: Needs further  clinical improvement and further evaluation of her UTI.   Consultants:  Medical oncology  Procedures:  As delineated as above Antimicrobials:  Anti-infectives (From admission, onward)    Start     Dose/Rate Route Frequency Ordered Stop   06/18/22 1200  cefTRIAXone (ROCEPHIN) 1 g in sodium chloride 0.9 % 100 mL IVPB        1 g 200 mL/hr over 30 Minutes Intravenous Daily 06/18/22 1034     06/18/22 1000  fluconazole (DIFLUCAN) tablet 100 mg        100 mg Oral Daily 06/17/22 1710     06/17/22 1600  cefTRIAXone (ROCEPHIN) 1 g in sodium chloride 0.9 % 100 mL IVPB        1 g 200 mL/hr over 30 Minutes Intravenous  Once 06/17/22 1548 06/17/22 1751       Subjective: Seen and examined at bedside and think she is doing little bit better.  Still had some loose stools.  No nausea or vomiting.  Feels a little bit stronger today.  No other concerns or complaints at this  time.  Objective: Vitals:   06/17/22 1729 06/17/22 2141 06/18/22 0133 06/18/22 0556  BP: (!) 168/80 (!) 149/66 (!) 153/62 (!) 162/64  Pulse: 66 67 72 65  Resp: '18 14 14 17  '$ Temp: 98.4 F (36.9 C) 98.4 F (36.9 C) 98.1 F (36.7 C) 98 F (36.7 C)  TempSrc: Oral Oral Oral Oral  SpO2: 95% 97% 96% 98%  Weight:    56.9 kg  Height:        Intake/Output Summary (Last 24 hours) at 06/18/2022 1451 Last data filed at 06/18/2022 1300 Gross per 24 hour  Intake 471.89 ml  Output --  Net 471.89 ml   Filed Weights   06/17/22 1113 06/18/22 0556  Weight: 65.8 kg 56.9 kg   Examination: Physical Exam:  Constitutional: WN/WD Caucasian female currently no acute distress Respiratory: Diminished to auscultation bilaterally, no wheezing, rales, rhonchi or crackles. Normal respiratory effort and patient is not tachypenic. No accessory muscle use.  Unlabored breathing Cardiovascular: RRR, no murmurs / rubs / gallops. S1 and S2 auscultated. No extremity edema.  Abdomen: Soft, non-tender, non-distended. Bowel sounds positive.  GU: Deferred. Musculoskeletal: No clubbing / cyanosis of digits/nails. No joint deformity upper and lower extremities. Skin: No rashes, lesions, ulcers on limited skin evaluation. No induration; Warm and dry.  Neurologic: CN 2-12 grossly intact with no focal deficits. Romberg sign and cerebellar reflexes not assessed.  Psychiatric: Normal judgment and insight. Alert and oriented x 3. Normal mood and appropriate affect.   Data Reviewed: I have personally reviewed following labs and imaging studies  CBC: Recent Labs  Lab 06/17/22 1448 06/18/22 0624  WBC 1.3* 1.4*  NEUTROABS 0.8* 0.8*  HGB 7.7* 7.5*  HCT 23.9* 23.3*  MCV 98.4 99.1  PLT 84* 69*   Basic Metabolic Panel: Recent Labs  Lab 06/17/22 1136 06/18/22 0624  NA 125* 131*  K 3.7 3.3*  CL 94* 103  CO2 21* 21*  GLUCOSE 122* 88  BUN 26* 21  CREATININE 1.20* 1.02*  CALCIUM 8.4* 7.8*  MG  --  1.4*  PHOS  --   2.8   GFR: Estimated Creatinine Clearance: 48.1 mL/min (A) (by C-G formula based on SCr of 1.02 mg/dL (H)). Liver Function Tests: Recent Labs  Lab 06/17/22 1136 06/18/22 0624  AST 35 36  ALT 15 19  ALKPHOS 52 40  BILITOT 0.6 0.6  PROT 7.1 5.0*  ALBUMIN 3.3* 2.4*  No results for input(s): "LIPASE", "AMYLASE" in the last 168 hours. No results for input(s): "AMMONIA" in the last 168 hours. Coagulation Profile: No results for input(s): "INR", "PROTIME" in the last 168 hours. Cardiac Enzymes: No results for input(s): "CKTOTAL", "CKMB", "CKMBINDEX", "TROPONINI" in the last 168 hours. BNP (last 3 results) No results for input(s): "PROBNP" in the last 8760 hours. HbA1C: No results for input(s): "HGBA1C" in the last 72 hours. CBG: Recent Labs  Lab 06/17/22 1115 06/18/22 0736  GLUCAP 106* 93   Lipid Profile: No results for input(s): "CHOL", "HDL", "LDLCALC", "TRIG", "CHOLHDL", "LDLDIRECT" in the last 72 hours. Thyroid Function Tests: Recent Labs    06/18/22 0625  TSH 9.081*   Anemia Panel: No results for input(s): "VITAMINB12", "FOLATE", "FERRITIN", "TIBC", "IRON", "RETICCTPCT" in the last 72 hours. Sepsis Labs: No results for input(s): "PROCALCITON", "LATICACIDVEN" in the last 168 hours.  Recent Results (from the past 240 hour(s))  Blood culture (routine x 2)     Status: None (Preliminary result)   Collection Time: 06/17/22  4:30 PM   Specimen: BLOOD  Result Value Ref Range Status   Specimen Description   Final    BLOOD PORTA CATH Performed at [facility] [address], [place], [place] [address]    Special Requests   Final    BOTTLES DRAWN AEROBIC AND ANAEROBIC Blood Culture adequate volume Performed at [facility] [address], [place], [place] [address]    Culture   Final    NO GROWTH < 24 HOURS Performed at [facility] [address], [place], [place] [address]    Report Status PENDING  Incomplete  Urine  Culture (for pregnant, neutropenic or urologic patients or patients with an indwelling urinary catheter)     Status: Abnormal (Preliminary result)   Collection Time: 06/17/22  4:47 PM   Specimen: Urine, Clean Catch  Result Value Ref Range Status   Specimen Description   Final    URINE, CLEAN CATCH Performed at [facility], [place] [address] [place], [place] [address]    Special Requests   Final    NONE Performed at [facility], [place] [address], [place], [place] [address]    Culture (A)  Final    >=100,000 COLONIES/mL ESCHERICHIA COLI SUSCEPTIBILITIES TO FOLLOW Performed at [facility], [place] [address], [place], [place] [address]    Report Status PENDING  Incomplete  Culture, blood (Routine X 2) w Reflex to ID Panel     Status: None (Preliminary result)   Collection Time: 06/17/22  9:51 PM   Specimen: BLOOD  Result Value Ref Range Status   Specimen Description   Final    BLOOD BLOOD LEFT ARM Performed at [facility] [address] [place], [place] [address]    Special Requests   Final    BOTTLES DRAWN AEROBIC AND ANAEROBIC Blood Culture adequate volume Performed at [facility] [address], [place], [place] [address]    Culture   Final    NO GROWTH < 12 HOURS Performed at [facility] [address], [place],  [address]    Report Status PENDING  Incomplete    Radiology Studies: DG Chest 2 View  Result Date: 06/17/2022 CLINICAL DATA:  Dyspnea on exertion. EXAM: CHEST - 2 VIEW COMPARISON:  None Available. FINDINGS: The heart size and mediastinal contours are within normal limits. Both lungs are clear. Right internal jugular Port-A-Cath  is noted. The visualized skeletal structures are unremarkable. IMPRESSION: No active cardiopulmonary disease. Electronically Signed   By: Marijo Conception M.D.   On: 06/17/2022 12:53    Scheduled Meds:  atenolol  25 mg Oral Daily   Chlorhexidine  Gluconate Cloth  6 each Topical Daily   feeding supplement  1 Container Oral TID BM   fluconazole  100 mg Oral Daily   heparin  5,000 Units Subcutaneous Q8H   lisinopril  20 mg Oral QPM   magic mouthwash w/lidocaine  5 mL Oral QID   potassium chloride  40 mEq Oral BID   Tbo-filgastrim (GRANIX) SQ  300 mcg Subcutaneous q1800   venlafaxine XR  37.5 mg Oral Q breakfast   Continuous Infusions:  sodium chloride     sodium chloride 100 mL/hr at 06/17/22 1719   cefTRIAXone (ROCEPHIN)  IV 1 g (06/18/22 1259)    LOS: 0 days   Raiford Noble, DO [facility] Available via Epic secure chat 7am-7pm After these hours, please refer to coverage provider listed on [email or web address] 06/18/2022, 2:51 PM

## 2022-06-18 NOTE — Progress Notes (Signed)
Holly [name]   DOB:01/23/1956   [id number]    ASSESSMENT & PLAN:  Recurrent fallopian tube cancer Continue supportive care for now Her next chemotherapy is not due for another 2 weeks   Acquired pancytopenia Due to recent chemotherapy She does not need transfusion support today G-CSF was started on 2/26, slightly improving She is slightly more anemic and thrombocytopenic, could be hemodilution, observe, no need transfusion today   Severe dehydration, urinary tract infection Urine culture is pending I agree with IV antibiotics She is feeling better with IV fluid hydration Continue the same   Possible stye/eyelid infection This is improving on antibiotics   Chronic kidney disease, hyponatremia, uncontrolled hypertension Agree with IV fluid resuscitation Continue intermittent antihypertensive treatment  Intermittent loose stool Could be due to antibiotics and diet related Observe for now   Discharge planning She will likely be here 2 to 3 days  All questions were answered. The patient knows to call the clinic with any problems, questions or concerns.   The total time spent in the appointment was 30 minutes encounter with patients including review of chart and various tests results, discussions about plan of care and coordination of care plan  Heath Lark, MD 06/18/2022 10:30 AM  Subjective:  Patient was admitted for neutropenic fever She is feeling better today Able to tolerate small amount of food She has some loose stool No recent fever or chills Denies nausea vomiting  Objective:  Vitals:   06/18/22 0133 06/18/22 0556  BP: (!) 153/62 (!) 162/64  Pulse: 72 65  Resp: 14 17  Temp: 98.1 F (36.7 C) 98 F (36.7 C)  SpO2: 96% 98%     Intake/Output Summary (Last 24 hours) at 06/18/2022 1030 Last data filed at 06/17/2022 1721 Gross per 24 hour  Intake 1.89 ml  Output --  Net 1.89 ml    GENERAL:alert, no distress and comfortable  NEURO: alert & oriented x 3  with fluent speech, no focal motor/sensory deficits   Labs:  Recent Labs    06/10/22 0811 06/17/22 1136 06/18/22 0624  NA 132* 125* 131*  K 4.2 3.7 3.3*  CL 101 94* 103  CO2 22 21* 21*  GLUCOSE 230* 122* 88  BUN 14 26* 21  CREATININE 0.98 1.20* 1.02*  CALCIUM 8.1* 8.4* 7.8*  GFRNONAA >60 50* >60  PROT 6.1* 7.1 5.0*  ALBUMIN 3.4* 3.3* 2.4*  AST 13* 35 36  ALT '6 15 19  '$ ALKPHOS 61 52 40  BILITOT 0.7 0.6 0.6    Studies:  DG Chest 2 View  Result Date: 06/17/2022 CLINICAL DATA:  Dyspnea on exertion. EXAM: CHEST - 2 VIEW COMPARISON:  None Available. FINDINGS: The heart size and mediastinal contours are within normal limits. Both lungs are clear. Right internal jugular Port-A-Cath is noted. The visualized skeletal structures are unremarkable. IMPRESSION: No active cardiopulmonary disease. Electronically Signed   By: Marijo Conception M.D.   On: 06/17/2022 12:53   CT ABDOMEN PELVIS W CONTRAST  Result Date: 06/05/2022 CLINICAL DATA:  Assess treatment response with chemotherapy for ovarian cancer. Shortness of breath on exertion. * Tracking Code: BO * EXAM: CT ABDOMEN AND PELVIS WITH CONTRAST TECHNIQUE: Multidetector CT imaging of the abdomen and pelvis was performed using the standard protocol following bolus administration of intravenous contrast. RADIATION DOSE REDUCTION: This exam was performed according to the departmental dose-optimization program which includes automated exposure control, adjustment of the mA and/or kV according to patient size and/or use of iterative reconstruction technique. CONTRAST:  35m OMNIPAQUE IOHEXOL 300 MG/ML  SOLN COMPARISON:  CT 04/04/2022 and older FINDINGS: Lower chest: New tiny bilateral pleural effusions are identified. Once again there are several small cavitary lesions in the lungs. Many of these are becoming more cavitary and less soft tissue elements and are smaller. Specific exams will be followed. The 7 mm lesion on the prior in the right lower lobe,  today on series 6, image 30 measures 6 mm. Again much less soft tissue component. Lesion just above this in the right lower lobe which measured 7 mm as well, today on image 19 of series 6 again 6 mm but much less soft tissue component. Small hiatal hernia. No pre cardiophrenic abnormal nodes. Hepatobiliary: Fatty liver infiltration identified. Contracted gallbladder. Persistent biliary duct ectasia. The thickening and calcifications seen along the margin of the falciform ligament anteriorly are stable today on image 20 of series 2. The surface implants are improving. Focus anterior to the left hepatic lobe superiorly which had measured 18 x 11 mm on the prior, today on series 2, image 14 measures 13 by 7 mm. Please see sagittal series 5 image 52. Additional focus posteriorly in segment 7 on image 18 of series 2 is stable. There is a rind of soft tissue along the lateral margin of the liver extending towards the dome. Pancreas: Global atrophy without enhancing mass or ductal dilatation. Spleen: Spleen is slightly enlarged with cephalocaudal length of 13.1 cm, similar in retrospect when adjusting for technique. Adrenals/Urinary Tract: Adrenal glands are preserved. Extrarenal pelvis seen of the right kidney and left. No enhancing renal mass or collecting system filling defect. Bosniak 1 exophytic left-sided renal cyst is stable. Of interest the density of the lumen of the renal pelvis bilaterally is increased on the left compared to right in the portal venous phase. This is of uncertain etiology and significance. Please correlate with the urinalysis. The ureters have normal course and caliber down to the bladder which is with preserved contour. The bladder is underdistended. Stomach/Bowel: Diffuse colonic stool. Large bowel is nondilated. There is rectus muscle diastasis with defect at the umbilicus with herniation of loops of large bowel and small bowel without obstruction. This is a wide mouth hernia and is unchanged  from previous. The small bowel is well is nondilated. Stomach is relatively collapsed. Vascular/Lymphatic: Diffuse vascular calcifications are seen. Preserved course and caliber of the aorta and IVC. Several enlarged nodes are identified. Specific lesions will be followed. This includes upper abdomen periceliac were some of the nodes are enlarged but calcified. Specific lesions will be followed for continuity. The lymph node to the right of the celiac on the prior which measured 20 x 13 mm, today is less well defined but estimated on series 2, image 24 at 19 by 10 mm. The lesion just above this which measured 15 x 10 mm also with some calcification, today on image 23 of series 2 measures 12 x 12 mm. Other retroperitoneal nodes are smaller ill-defined with more stranding but relatively similar in size. Bilateral inguinal nodes are again seen as well with some calcification. The right-sided focus which measured 18 x 14 mm, today on image 78 of series 2 measures 17 by 14 mm. Left-sided node which measured 14 x 17 mm on series 2, image 75 measures 14 x 16 mm. No new nodal enlargement. Reproductive: Status post hysterectomy. No adnexal masses. Other: No developing significant ascites. There is a small cystic focus identified the central mesentery on image 36 of series 2.  This appears relatively simple measuring 15 by 12 mm with Hounsfield units of 13. There are some small areas of mesenteric nodularity which appear relatively similar to previous such as on the right side on image 41 measuring 14 by 9 mm, central pelvis image 60 of series 2 measuring 13 by 9 mm. This also could represent prominent nodes. No definite new areas of peritoneal nodularity at this time Musculoskeletal: Mild degenerative changes of the spine and pelvis. Mild skin thickening along the anterior pelvic wall with some anasarca. IMPRESSION: 1. New tiny bilateral pleural effusions. 2. Once again there are several small cavitary lesions in the lungs.  Many of these are becoming more cavitary with less soft tissue elements and are smaller today. 3. Soft tissue thickening and nodularity along the surface of the liver appears to be overall decreasing from previous examination. Some of these areas have calcification. 4. Mesenteric nodules as well as retroperitoneal and mesenteric nodes appear similar to slightly improved. The inguinal nodes seen previously are similar. 5. No developing new mass lesion, ascites or lymph node enlargement seen. 6. Of interest the density of the lumen of the renal pelvis bilaterally is increased on the left compared to the right in the portal venous phase. This is of uncertain etiology and significance. Please correlate with the urinalysis such as for hematuria. Electronically Signed   By: Jill Side M.D.   On: 06/05/2022 13:40

## 2022-06-18 NOTE — Evaluation (Signed)
Physical Therapy Evaluation Patient Details Name: Holly [name] MRN: [id number] DOB: 1956-02-10 Today's Date: 06/18/2022  History of Present Illness  67 yo dehydration, panctytopenia, UTI. Hx of ovarian Ca-recurrent, CKD, hiatal hernia, radical tumor debulking surgery  Clinical Impression  On eval, pt was Min guard A for mobility. She walked ~250 feet around the unit. Mildly unsteady intermittently. O2 92% on RA. Pt tolerated activity fairly well. Will plan to follow pt during this hospital stay. Do not anticipate any f/u PT needs after discharge.        Recommendations for follow up therapy are one component of a multi-disciplinary discharge planning process, led by the attending physician.  Recommendations may be updated based on patient status, additional functional criteria and insurance authorization.  Follow Up Recommendations No PT follow up      Assistance Recommended at Discharge PRN  Patient can return home with the following  Assistance with cooking/housework;Assist for transportation;Help with stairs or ramp for entrance    Equipment Recommendations None recommended by PT  Recommendations for Other Services       Functional Status Assessment Patient has had a recent decline in their functional status and demonstrates the ability to make significant improvements in function in a reasonable and predictable amount of time.     Precautions / Restrictions        Mobility  Bed Mobility Overal bed mobility: Modified Independent                  Transfers Overall transfer level: Modified independent                      Ambulation/Gait Ambulation/Gait assistance: Min guard Gait Distance (Feet): 250 Feet Assistive device: None Gait Pattern/deviations: Step-through pattern       General Gait Details: mild unsteadiness intermittently. no overt lob. O2 92% on RA. Pt tolerated distance well.  Stairs            Wheelchair Mobility    Modified  Rankin (Stroke Patients Only)       Balance Overall balance assessment: Mild deficits observed, not formally tested                                           Pertinent Vitals/Pain Pain Assessment Pain Assessment: No/denies pain    Home Living Family/patient expects to be discharged to:: Private residence Living Arrangements: Spouse/significant other Available Help at Discharge: Family Type of Home: House Home Access: Stairs to enter   [employer] of Steps: a few   Home Layout: One level Home Equipment: Conservation officer, nature (2 wheels);Cane - single point      Prior Function Prior Level of Function : Independent/Modified Independent                     Hand Dominance        Extremity/Trunk Assessment   Upper Extremity Assessment Upper Extremity Assessment: Overall WFL for tasks assessed    Lower Extremity Assessment Lower Extremity Assessment: Generalized weakness    Cervical / Trunk Assessment Cervical / Trunk Assessment: Normal  Communication   Communication: No difficulties  Cognition Arousal/Alertness: Awake/alert Behavior During Therapy: WFL for tasks assessed/performed Overall Cognitive Status: Within Functional Limits for tasks assessed  General Comments      Exercises     Assessment/Plan    PT Assessment Patient needs continued PT services  PT Problem List Decreased strength;Decreased balance;Decreased activity tolerance;Decreased mobility       PT Treatment Interventions Functional mobility training;Balance training;Patient/family education;Gait training;Therapeutic activities;Therapeutic exercise    PT Goals (Current goals can be found in the Care Plan section)  Acute Rehab PT Goals Patient Stated Goal: home soon PT Goal Formulation: With patient/family Time For Goal Achievement: 07/02/22 Potential to Achieve Goals: Good    Frequency Min 3X/week      Co-evaluation               AM-PAC PT "6 Clicks" Mobility  Outcome Measure Help needed turning from your back to your side while in a flat bed without using bedrails?: None Help needed moving from lying on your back to sitting on the side of a flat bed without using bedrails?: None Help needed moving to and from a bed to a chair (including a wheelchair)?: None Help needed standing up from a chair using your arms (e.g., wheelchair or bedside chair)?: None Help needed to walk in hospital room?: A Little Help needed climbing 3-5 steps with a railing? : A Little 6 Click Score: 22    End of Session Equipment Utilized During Treatment: Gait belt Activity Tolerance: Patient tolerated treatment well Patient left: in bed;with call bell/phone within reach;with family/visitor present   PT Visit Diagnosis: Muscle weakness (generalized) (M62.81)    Time: [id number] PT Time Calculation (min) (ACUTE ONLY): 14 min   Charges:   PT Evaluation $PT Eval Low Complexity: [place], PT Acute Rehabilitation  Office: [phone]

## 2022-06-18 NOTE — Progress Notes (Signed)
OT Cancellation Note  Patient Details Name: MALAJAH KADRMAS MRN: [id number] DOB: [birth year]   Cancelled Treatment:    Reason Eval/Treat Not Completed: OT screened, no needs identified, will sign off. Attempted in AM and pt just completed bath and asked for rest. Returned in PM and noted PT note of pt ambulating 200' with Min guard. Pt sleeping on OT return however spouse present and stated no OT needs and agreeable for OT to sign off. Reported no DME needs for home.   Julien Girt 06/18/2022, 2:16 PM

## 2022-06-19 DIAGNOSIS — N3000 Acute cystitis without hematuria: Secondary | ICD-10-CM

## 2022-06-19 DIAGNOSIS — N179 Acute kidney failure, unspecified: Secondary | ICD-10-CM | POA: Diagnosis present

## 2022-06-19 DIAGNOSIS — Z888 Allergy status to other drugs, medicaments and biological substances status: Secondary | ICD-10-CM | POA: Diagnosis not present

## 2022-06-19 DIAGNOSIS — N390 Urinary tract infection, site not specified: Secondary | ICD-10-CM | POA: Diagnosis present

## 2022-06-19 DIAGNOSIS — D708 Other neutropenia: Secondary | ICD-10-CM | POA: Diagnosis present

## 2022-06-19 DIAGNOSIS — D61810 Antineoplastic chemotherapy induced pancytopenia: Secondary | ICD-10-CM | POA: Diagnosis present

## 2022-06-19 DIAGNOSIS — Z8543 Personal history of malignant neoplasm of ovary: Secondary | ICD-10-CM | POA: Diagnosis not present

## 2022-06-19 DIAGNOSIS — E8809 Other disorders of plasma-protein metabolism, not elsewhere classified: Secondary | ICD-10-CM | POA: Diagnosis present

## 2022-06-19 DIAGNOSIS — Z79899 Other long term (current) drug therapy: Secondary | ICD-10-CM | POA: Diagnosis not present

## 2022-06-19 DIAGNOSIS — B9620 Unspecified Escherichia coli [E. coli] as the cause of diseases classified elsewhere: Secondary | ICD-10-CM | POA: Diagnosis present

## 2022-06-19 DIAGNOSIS — F32A Depression, unspecified: Secondary | ICD-10-CM | POA: Diagnosis present

## 2022-06-19 DIAGNOSIS — R197 Diarrhea, unspecified: Secondary | ICD-10-CM | POA: Diagnosis present

## 2022-06-19 DIAGNOSIS — E860 Dehydration: Secondary | ICD-10-CM | POA: Diagnosis not present

## 2022-06-19 DIAGNOSIS — F419 Anxiety disorder, unspecified: Secondary | ICD-10-CM | POA: Diagnosis present

## 2022-06-19 DIAGNOSIS — E872 Acidosis, unspecified: Secondary | ICD-10-CM | POA: Diagnosis present

## 2022-06-19 DIAGNOSIS — C5700 Malignant neoplasm of unspecified fallopian tube: Secondary | ICD-10-CM | POA: Diagnosis present

## 2022-06-19 DIAGNOSIS — E440 Moderate protein-calorie malnutrition: Secondary | ICD-10-CM | POA: Insufficient documentation

## 2022-06-19 DIAGNOSIS — I10 Essential (primary) hypertension: Secondary | ICD-10-CM | POA: Diagnosis present

## 2022-06-19 DIAGNOSIS — G620 Drug-induced polyneuropathy: Secondary | ICD-10-CM | POA: Diagnosis present

## 2022-06-19 DIAGNOSIS — Z885 Allergy status to narcotic agent status: Secondary | ICD-10-CM | POA: Diagnosis not present

## 2022-06-19 DIAGNOSIS — K219 Gastro-esophageal reflux disease without esophagitis: Secondary | ICD-10-CM | POA: Diagnosis present

## 2022-06-19 DIAGNOSIS — Z6824 Body mass index (BMI) 24.0-24.9, adult: Secondary | ICD-10-CM | POA: Diagnosis not present

## 2022-06-19 DIAGNOSIS — E871 Hypo-osmolality and hyponatremia: Secondary | ICD-10-CM | POA: Diagnosis present

## 2022-06-19 DIAGNOSIS — T451X5A Adverse effect of antineoplastic and immunosuppressive drugs, initial encounter: Secondary | ICD-10-CM | POA: Diagnosis present

## 2022-06-19 DIAGNOSIS — R946 Abnormal results of thyroid function studies: Secondary | ICD-10-CM | POA: Diagnosis present

## 2022-06-19 LAB — COMPREHENSIVE METABOLIC PANEL
ALT: 17 U/L (ref 0–44)
AST: 24 U/L (ref 15–41)
Albumin: 2.3 g/dL — ABNORMAL LOW (ref 3.5–5.0)
Alkaline Phosphatase: 42 U/L (ref 38–126)
Anion gap: 6 (ref 5–15)
BUN: 16 mg/dL (ref 8–23)
CO2: 19 mmol/L — ABNORMAL LOW (ref 22–32)
Calcium: 7.9 mg/dL — ABNORMAL LOW (ref 8.9–10.3)
Chloride: 111 mmol/L (ref 98–111)
Creatinine, Ser: 0.97 mg/dL (ref 0.44–1.00)
GFR, Estimated: >60 mL/min (ref >60)
Glucose, Bld: 66 mg/dL — ABNORMAL LOW (ref 70–99)
Potassium: 4.6 mmol/L (ref 3.5–5.1)
Sodium: 136 mmol/L (ref 135–145)
Total Bilirubin: 0.6 mg/dL (ref 0.3–1.2)
Total Protein: 4.9 g/dL — ABNORMAL LOW (ref 6.5–8.1)

## 2022-06-19 LAB — CBC WITH DIFFERENTIAL/PLATELET
Abs Immature Granulocytes: 0.04 10*3/uL (ref 0.00–0.07)
Basophils Absolute: 0 10*3/uL (ref 0.0–0.1)
Basophils Relative: 1 %
Eosinophils Absolute: 0 10*3/uL (ref 0.0–0.5)
Eosinophils Relative: 3 %
HCT: 24.5 % — ABNORMAL LOW (ref 36.0–46.0)
Hemoglobin: 7.8 g/dL — ABNORMAL LOW (ref 12.0–15.0)
Immature Granulocytes: 3 %
Lymphocytes Relative: 27 %
Lymphs Abs: 0.4 10*3/uL — ABNORMAL LOW (ref 0.7–4.0)
MCH: 32.1 pg (ref 26.0–34.0)
MCHC: 31.8 g/dL (ref 30.0–36.0)
MCV: 100.8 fL — ABNORMAL HIGH (ref 80.0–100.0)
Monocytes Absolute: 0.4 10*3/uL (ref 0.1–1.0)
Monocytes Relative: 23 %
Neutro Abs: 0.7 10*3/uL — ABNORMAL LOW (ref 1.7–7.7)
Neutrophils Relative %: 43 %
Platelets: 80 10*3/uL — ABNORMAL LOW (ref 150–400)
RBC: 2.43 MIL/uL — ABNORMAL LOW (ref 3.87–5.11)
RDW: 17 % — ABNORMAL HIGH (ref 11.5–15.5)
WBC: 1.6 10*3/uL — ABNORMAL LOW (ref 4.0–10.5)
nRBC: 0 % (ref 0.0–0.2)

## 2022-06-19 LAB — URINE CULTURE: Culture: >=100000 — AB

## 2022-06-19 LAB — GASTROINTESTINAL PANEL BY PCR, STOOL (REPLACES STOOL CULTURE)

## 2022-06-19 LAB — GLUCOSE, CAPILLARY
Glucose-Capillary: 66 mg/dL — ABNORMAL LOW (ref 70–99)
Glucose-Capillary: 75 mg/dL (ref 70–99)

## 2022-06-19 LAB — PHOSPHORUS: Phosphorus: 3.1 mg/dL (ref 2.5–4.6)

## 2022-06-19 LAB — T4, FREE: Free T4: 1 ng/dL (ref 0.61–1.12)

## 2022-06-19 LAB — MAGNESIUM: Magnesium: 2.4 mg/dL (ref 1.7–2.4)

## 2022-06-19 MED ORDER — CEFADROXIL 500 MG PO CAPS
500.0000 mg | ORAL_CAPSULE | Freq: Two times a day (BID) | ORAL | Status: DC
  Administered 2022-06-20: 500 mg via ORAL
  Filled 2022-06-19: qty 1

## 2022-06-19 MED ORDER — SODIUM CHLORIDE 0.9 % IV SOLN: 1000.0000 mL | INTRAVENOUS | Status: DC

## 2022-06-19 NOTE — Progress Notes (Signed)
PROGRESS NOTE  Holly [name] [id number] DOB: [birth year] DOA: 06/17/2022 PCP: Mayra Neer, MD  HPI/Recap of past 24 hours: Holly [name] is a 67 y.o. female with medical history significant for but not limited to hypertension, GERD, history of fallopian tubes adenocarcinoma with recurrent disease, history of postoperative nausea vomiting as well as other comorbidities who presents with worsening generalized weakness and fatigue, found to be in severe dehydration.  Patient was adequately hydrated, while inpatient, also found to have E. coli UTI.  Patient admitted for further management.  Oncology on board   Today, patient reports feeling better, denies any new complaints.  Reports Magic mouthwash is consulted nausea.   Assessment/Plan: Principal Problem:   Dehydration Active Problems:   Other neutropenia (HCC)   UTI (urinary tract infection)   Malnutrition of moderate degree   Severe Dehydration Improved Continue IV fluids, encourage p.o. intake   AKI Metabolic Acidosis AKI has improved Daily CMP  E Coli UTI U/A done and showed a hazy appearance with moderate hemoglobin, large leukocytes, negative nitrite, many bacteria, present mucus, greater than 50 WBCs Urine culture showing greater than 100,000 E. coli  BC x 2 NGTD S/p ceftriaxone, will switch to cefadroxil to complete a total of 5 days   Mucositis Continue fluconazole 100 mg p.o. daily  Diarrhea Resolving GI pathogen panel as well as C. Difficile both negative   HTN C/w Home Medications with Atenolol 25 mg po Daily and Lisinopril 20 mg po Daily    Depression and Anxiety C/w Venlafaxine XR 37.5 mg po Daily    Acquired Pancytopenia Likely chemotherapy induced with some possible hemodilution  No bleeding noted Continue Granix Daily cbc  Recurrent fallopian tube adenocarcinoma Medical oncology, Dr Alvy Bimler on board Her next chemotherapy is not due for another few weeks  Elevated TSH TSH 9.081 Free  T4 pending   Hypoalbuminemia Nutritionist consulted     Malnutrition Type:  Nutrition Problem: Moderate Malnutrition Etiology: chronic illness, cancer and cancer related treatments   Malnutrition Characteristics:  Signs/Symptoms: mild muscle depletion, percent weight loss   Nutrition Interventions:  Interventions: Boost Breeze, Education    Estimated body mass index is 20.87 kg/m as calculated from the following:   Height as of this encounter: '5\' 5"'$  (1.651 m).   Weight as of this encounter: 56.9 kg.       Code Status: Full  Family Communication: Husband at bedside  Disposition Plan: Status is: Inpatient Remains inpatient appropriate because: Level of care      Consultants: Oncology  Procedures: None  Antimicrobials: Cefadroxil  DVT prophylaxis:  heparin   Objective: Vitals:   06/18/22 2356 06/19/22 0446 06/19/22 0759 06/19/22 1437  BP: (!) 138/59 (!) 175/62 (!) 175/61 (!) 165/52  Pulse: 64 (!) 59 (!) 59 60  Resp: '18 18 18 '$ (!) 21  Temp: 98 F (36.7 C) 97.8 F (36.6 C) 97.8 F (36.6 C) 97.8 F (36.6 C)  TempSrc: Oral Oral Oral Oral  SpO2: 98% 98% 97% 99%  Weight:      Height:        Intake/Output Summary (Last 24 hours) at 06/19/2022 1526 Last data filed at 06/19/2022 1500 Gross per 24 hour  Intake 2473.3 ml  Output --  Net 2473.3 ml   Filed Weights   06/17/22 1113 06/18/22 0556  Weight: 65.8 kg 56.9 kg    Exam: General: NAD  Cardiovascular: S1, S2 present Respiratory: CTAB Abdomen: Soft, nontender, nondistended, bowel sounds present Musculoskeletal: No bilateral pedal edema noted Skin:  Normal Psychiatry: Normal mood     Data Reviewed: CBC: Recent Labs  Lab 06/17/22 1448 06/18/22 0624 06/19/22 0500  WBC 1.3* 1.4* 1.6*  NEUTROABS 0.8* 0.8* 0.7*  HGB 7.7* 7.5* 7.8*  HCT 23.9* 23.3* 24.5*  MCV 98.4 99.1 100.8*  PLT 84* 69* 80*   Basic Metabolic Panel: Recent Labs  Lab 06/17/22 1136 06/18/22 0624 06/19/22 0500   NA 125* 131* 136  K 3.7 3.3* 4.6  CL 94* 103 111  CO2 21* 21* 19*  GLUCOSE 122* 88 66*  BUN 26* 21 16  CREATININE 1.20* 1.02* 0.97  CALCIUM 8.4* 7.8* 7.9*  MG  --  1.4* 2.4  PHOS  --  2.8 3.1   GFR: Estimated Creatinine Clearance: 50.6 mL/min (by C-G formula based on SCr of 0.97 mg/dL). Liver Function Tests: Recent Labs  Lab 06/17/22 1136 06/18/22 0624 06/19/22 0500  AST 35 36 24  ALT '15 19 17  '$ ALKPHOS 52 40 42  BILITOT 0.6 0.6 0.6  PROT 7.1 5.0* 4.9*  ALBUMIN 3.3* 2.4* 2.3*   No results for input(s): "LIPASE", "AMYLASE" in the last 168 hours. No results for input(s): "AMMONIA" in the last 168 hours. Coagulation Profile: No results for input(s): "INR", "PROTIME" in the last 168 hours. Cardiac Enzymes: No results for input(s): "CKTOTAL", "CKMB", "CKMBINDEX", "TROPONINI" in the last 168 hours. BNP (last 3 results) No results for input(s): "PROBNP" in the last 8760 hours. HbA1C: No results for input(s): "HGBA1C" in the last 72 hours. CBG: Recent Labs  Lab 06/17/22 1115 06/18/22 0736 06/19/22 0720 06/19/22 0755  GLUCAP 106* 93 66* 75   Lipid Profile: No results for input(s): "CHOL", "HDL", "LDLCALC", "TRIG", "CHOLHDL", "LDLDIRECT" in the last 72 hours. Thyroid Function Tests: Recent Labs    06/18/22 0625  TSH 9.081*   Anemia Panel: No results for input(s): "VITAMINB12", "FOLATE", "FERRITIN", "TIBC", "IRON", "RETICCTPCT" in the last 72 hours. Urine analysis:    Component Value Date/Time   COLORURINE YELLOW 06/17/2022 1531   APPEARANCEUR HAZY (A) 06/17/2022 1531   LABSPEC 1.009 06/17/2022 1531   PHURINE 5.0 06/17/2022 1531   GLUCOSEU NEGATIVE 06/17/2022 1531   HGBUR MODERATE (A) 06/17/2022 1531   BILIRUBINUR NEGATIVE 06/17/2022 1531   KETONESUR NEGATIVE 06/17/2022 1531   PROTEINUR NEGATIVE 06/17/2022 1531   UROBILINOGEN 0.2 08/12/2016 2019   NITRITE NEGATIVE 06/17/2022 1531   LEUKOCYTESUR LARGE (A) 06/17/2022 1531   Sepsis  Labs: '@LABRCNTIP'$ (procalcitonin:4,lacticidven:4)  ) Recent Results (from the past 240 hour(s))  Blood culture (routine x 2)     Status: None (Preliminary result)   Collection Time: 06/17/22  4:30 PM   Specimen: BLOOD  Result Value Ref Range Status   Specimen Description   Final    BLOOD PORTA CATH Performed at [facility], [place] [address] [place], [place] [address]    Special Requests   Final    BOTTLES DRAWN AEROBIC AND ANAEROBIC Blood Culture adequate volume Performed at [facility] [address], [place], [place] [address]    Culture   Final    NO GROWTH < 24 HOURS Performed at [facility] [address], [place], [place] [address]    Report Status PENDING  Incomplete  Urine Culture (for pregnant, neutropenic or urologic patients or patients with an indwelling urinary catheter)     Status: Abnormal   Collection Time: 06/17/22  4:47 PM   Specimen: Urine, Clean Catch  Result Value Ref Range Status   Specimen Description  Final    URINE, CLEAN CATCH Performed at [facility], [place] [address] [place], [place] [address]    Special Requests   Final    NONE Performed at [facility], [place] [address], [place], [place] [address]    Culture >=100,000 COLONIES/mL ESCHERICHIA COLI (A)  Final   Report Status 06/19/2022 FINAL  Final   Organism ID, Bacteria ESCHERICHIA COLI (A)  Final      Susceptibility   Escherichia coli - MIC*    AMPICILLIN <=2 SENSITIVE Sensitive     CEFAZOLIN <=4 SENSITIVE Sensitive     CEFEPIME <=0.12 SENSITIVE Sensitive     CEFTRIAXONE <=0.25 SENSITIVE Sensitive     CIPROFLOXACIN <=0.25 SENSITIVE Sensitive     GENTAMICIN <=1 SENSITIVE Sensitive     IMIPENEM <=0.25 SENSITIVE Sensitive     NITROFURANTOIN <=16 SENSITIVE Sensitive     TRIMETH/SULFA <=20 SENSITIVE Sensitive     AMPICILLIN/SULBACTAM <=2 SENSITIVE Sensitive     PIP/TAZO <=4 SENSITIVE Sensitive     * >=100,000  COLONIES/mL ESCHERICHIA COLI  Culture, blood (Routine X 2) w Reflex to ID Panel     Status: None (Preliminary result)   Collection Time: 06/17/22  9:51 PM   Specimen: BLOOD  Result Value Ref Range Status   Specimen Description   Final    BLOOD BLOOD LEFT ARM Performed at [facility] [address] [place], [place] [address]    Special Requests   Final    BOTTLES DRAWN AEROBIC AND ANAEROBIC Blood Culture adequate volume Performed at [facility] [address] [place], [place] [address]    Culture   Final    NO GROWTH < 12 HOURS Performed at [facility] [address], [place],  [address]    Report Status PENDING  Incomplete  Gastrointestinal Panel by PCR , Stool     Status: None   Collection Time: 06/18/22  1:36 PM   Specimen: STOOL  Result Value Ref Range Status   Campylobacter species NOT DETECTED NOT DETECTED Final   Plesimonas shigelloides NOT DETECTED NOT DETECTED Final   Salmonella species NOT DETECTED NOT DETECTED Final   Yersinia enterocolitica NOT DETECTED NOT DETECTED Final   Vibrio species NOT DETECTED NOT DETECTED Final   Vibrio cholerae NOT DETECTED NOT DETECTED Final   Enteroaggregative E coli (EAEC) NOT DETECTED NOT DETECTED Final   Enteropathogenic E coli (EPEC) NOT DETECTED NOT DETECTED Final   Enterotoxigenic E coli (ETEC) NOT DETECTED NOT DETECTED Final   Shiga like toxin producing E coli (STEC) NOT DETECTED NOT DETECTED Final   Shigella/Enteroinvasive E coli (EIEC) NOT DETECTED NOT DETECTED Final   Cryptosporidium NOT DETECTED NOT DETECTED Final   Cyclospora cayetanensis NOT DETECTED NOT DETECTED Final   Entamoeba histolytica NOT DETECTED NOT DETECTED Final   Giardia lamblia NOT DETECTED NOT DETECTED Final   Adenovirus F40/41 NOT DETECTED NOT DETECTED Final   Astrovirus NOT DETECTED NOT DETECTED Final   Norovirus GI/GII NOT DETECTED NOT DETECTED Final   Rotavirus A NOT DETECTED NOT DETECTED Final    Sapovirus (I, II, IV, and V) NOT DETECTED NOT DETECTED Final    Comment: Performed at [facility], [place] [place], [place] [address]  C Difficile Quick Screen w PCR reflex     Status: None   Collection Time: 06/18/22  1:36 PM   Specimen: STOOL  Result Value Ref Range Status   C Diff antigen NEGATIVE NEGATIVE Final  C Diff toxin NEGATIVE NEGATIVE Final   C Diff interpretation No C. difficile detected.  Final    Comment: Performed at [facility], [place] [address], [place], [place] [address]      Studies: No results found.  Scheduled Meds:  atenolol  25 mg Oral Daily   [START ON 06/20/2022] cefadroxil  500 mg Oral BID   Chlorhexidine Gluconate Cloth  6 each Topical Daily   feeding supplement  1 Container Oral TID BM   fluconazole  100 mg Oral Daily   heparin  5,000 Units Subcutaneous Q8H   lisinopril  20 mg Oral QPM   Tbo-filgastrim (GRANIX) SQ  300 mcg Subcutaneous q1800   venlafaxine XR  37.5 mg Oral Q breakfast    Continuous Infusions:  sodium chloride 100 mL/hr at 06/17/22 1719   sodium chloride 50 mL/hr at 06/19/22 1107     LOS: 0 days     Alma Friendly, MD [facility]  If 7PM-7AM, please contact night-coverage [email or web address] 06/19/2022, 3:26 PM

## 2022-06-19 NOTE — Progress Notes (Signed)
PT Cancellation/Discharge Note  Patient Details Name: KESHONA PULIAFICO MRN: [id number] DOB: [birth year]   Cancelled Treatment:    Reason Eval/Treat Not Completed: PT screened, no needs identified, will sign off. Pt denied any further need for PT services. Will sign off at pt's request.    Doreatha Massed, PT Acute Rehabilitation  Office: [phone]

## 2022-06-19 NOTE — Progress Notes (Signed)
PT Cancellation Note  Patient Details Name: Holly [name] MRN: [id number] DOB: [birth year]   Cancelled Treatment:    Reason Eval/Treat Not Completed: Patient declined, no reason specified. Pt politely declines, reports she will take a walk with her spouse when the TV show ends at noon-notified RN. Will continue to follow.    Talbot Grumbling PT, DPT 06/19/22, 11:37 AM

## 2022-06-19 NOTE — TOC Progression Note (Signed)
Transition of Care [facility]) - Progression Note    Patient Details  Name: Holly [name] MRN: [id number] Date of Birth: Oct 05, 1955  Transition of Care [facility]) CM/SW [place], RN Phone Number:[phone]  06/19/2022, 8:29 AM  Clinical Narrative:     Transition of Care [facility]) Screening Note   Patient Details  Name: Holly [name] Date of Birth: 27-Aug-1955   Transition of Care [facility]) CM/SW Contact:    Angelita Ingles, RN Phone Number: 06/19/2022, 8:29 AM    Transition of Care Department [facility]) has reviewed patient and no TOC needs have been identified at this time. We will continue to monitor patient advancement through interdisciplinary progression rounds. If new patient transition needs arise, please place a TOC consult.          Expected Discharge Plan and Services                                               Social Determinants of Health (SDOH) Interventions SDOH Screenings   Food Insecurity: No Food Insecurity (06/17/2022)  Transportation Needs: No Transportation Needs (06/17/2022)  Utilities: Not At Risk (06/17/2022)  Tobacco Use: Low Risk  (06/17/2022)    Readmission Risk Interventions     No data to display

## 2022-06-19 NOTE — Progress Notes (Signed)
Initial Nutrition Assessment  DOCUMENTATION CODES:   Non-severe (moderate) malnutrition in context of chronic illness  INTERVENTION:   -Boost Breeze po TID, each supplement provides 250 kcal and 9 grams of protein  -Reviewed soft foods with patient  NUTRITION DIAGNOSIS:   Moderate Malnutrition related to chronic illness, cancer and cancer related treatments as evidenced by mild muscle depletion, percent weight loss.  GOAL:   Patient will meet greater than or equal to 90% of their needs  MONITOR:   PO intake, Supplement acceptance, Labs, Weight trends, I & O's  REASON FOR ASSESSMENT:   Malnutrition Screening Tool    ASSESSMENT:   67 y.o. female with medical history significant for but not limited to hypertension, GERD, history of fallopian tubes adenocarcinoma with recurrent disease, history of postoperative nausea vomiting as well as other comorbidities who presents with extreme generalized weakness and fatigue that is progressively gotten worse the last few days.  Patient in room, husband at bedside. Reports pt was not eating any better. Her mucositis has improved and she reports she no longer has any mouth pain. Now she has dry mouth. We reviewed adding gravies and sauces to foods to help with swallowing. Pt has no taste, states everything is bland. Has been having diarrhea, r/t being on antibiotics.  Pt reports liking chocolate milk mixed with ice cream but was advised to stay away from milk and dairy while having diarrhea. She is agreeable to trying Boost Breeze now that her mouth pain is improved. Does not like Ensure.   Per weight records, pt has lost 27 lbs since 1/8 (17% wt loss x 1.5 months, significant for time frame).   Medications reviewed.  Labs reviewed: CBGs: 66-93   NUTRITION - FOCUSED PHYSICAL EXAM:  Flowsheet Row Most Recent Value  Orbital Region No depletion  Upper Arm Region No depletion  Thoracic and Lumbar Region Unable to assess  Temple Region  Mild depletion  Clavicle Bone Region Mild depletion  Clavicle and Acromion Bone Region Mild depletion  Scapular Bone Region Unable to assess  Dorsal Hand Mild depletion  Patellar Region Unable to assess  Anterior Thigh Region Unable to assess  Posterior Calf Region Unable to assess  Edema (RD Assessment) None  Hair Unable to assess  [covered]  Eyes Reviewed  Mouth Reviewed  [reports dry mouth]  Skin Reviewed       Diet Order:   Diet Order             DIET SOFT Room service appropriate? Yes; Fluid consistency: Thin  Diet effective now                   EDUCATION NEEDS:   Education needs have been addressed  Skin:  Skin Assessment: Reviewed RN Assessment  Last BM:  2/27 -type 7  Height:   Ht Readings from Last 1 Encounters:  06/17/22 '5\' 5"'$  (1.651 m)    Weight:   Wt Readings from Last 1 Encounters:  06/18/22 56.9 kg    BMI:  Body mass index is 20.87 kg/m.  Estimated Nutritional Needs:   Kcal:  1700-1900  Protein:  85-95g  Fluid:  1.9L/day  Clayton Bibles, MS, RD, LDN Inpatient Clinical Dietitian Contact information available via Amion

## 2022-06-19 NOTE — Progress Notes (Signed)
Holly [name]   DOB:[birth year]   [id number]    ASSESSMENT & PLAN:   Recurrent fallopian tube cancer Continue supportive care for now Her next chemotherapy is not due for another 2 weeks   Acquired pancytopenia Due to recent chemotherapy She does not need transfusion support today G-CSF was started on 2/26, slightly improving Anemia and thrombocytopenia is stable, no transfusion needed   Severe dehydration, urinary tract infection Urine culture is pending I agree with IV antibiotics She is feeling better with IV fluid hydration Continue the same She is able to drink a bit more oral fluids Will cut back on IV fluids   Possible stye/eyelid infection This is improving on antibiotics   Chronic kidney disease, hyponatremia, uncontrolled hypertension Agree with IV fluid resuscitation Continue intermittent antihypertensive treatment   Intermittent loose stool This has resolved   Discharge planning I am hoping she can be discharged within the next 24 to 48 hours Heath Lark, MD 06/19/2022 9:23 AM  Subjective:  She is eating better.  Able to hydrate.  Felt a bit stronger overall.  No further diarrhea She felt that the Magic mouthwash is causing gagging  Objective:  Vitals:   06/19/22 0446 06/19/22 0759  BP: (!) 175/62 (!) 175/61  Pulse: (!) 59 (!) 59  Resp: 18 18  Temp: 97.8 F (36.6 C) 97.8 F (36.6 C)  SpO2: 98% 97%     Intake/Output Summary (Last 24 hours) at 06/19/2022 [id number] Last data filed at 06/18/2022 1826 Gross per 24 hour  Intake 2376.07 ml  Output --  Net 2376.07 ml    GENERAL:alert, no distress and comfortable  NEURO: alert & oriented x 3 with fluent speech, no focal motor/sensory deficits   Labs:  Recent Labs    06/17/22 1136 06/18/22 0624 06/19/22 0500  NA 125* 131* 136  K 3.7 3.3* 4.6  CL 94* 103 111  CO2 21* 21* 19*  GLUCOSE 122* 88 66*  BUN 26* 21 16  CREATININE 1.20* 1.02* 0.97  CALCIUM 8.4* 7.8* 7.9*  GFRNONAA 50* >60 >60  PROT 7.1  5.0* 4.9*  ALBUMIN 3.3* 2.4* 2.3*  AST 35 36 24  ALT '15 19 17  '$ ALKPHOS 52 40 42  BILITOT 0.6 0.6 0.6    Studies:  DG Chest 2 View  Result Date: 06/17/2022 CLINICAL DATA:  Dyspnea on exertion. EXAM: CHEST - 2 VIEW COMPARISON:  None Available. FINDINGS: The heart size and mediastinal contours are within normal limits. Both lungs are clear. Right internal jugular Port-A-Cath is noted. The visualized skeletal structures are unremarkable. IMPRESSION: No active cardiopulmonary disease. Electronically Signed   By: Marijo Conception M.D.   On: 06/17/2022 12:53   CT ABDOMEN PELVIS W CONTRAST  Result Date: 06/05/2022 CLINICAL DATA:  Assess treatment response with chemotherapy for ovarian cancer. Shortness of breath on exertion. * Tracking Code: BO * EXAM: CT ABDOMEN AND PELVIS WITH CONTRAST TECHNIQUE: Multidetector CT imaging of the abdomen and pelvis was performed using the standard protocol following bolus administration of intravenous contrast. RADIATION DOSE REDUCTION: This exam was performed according to the departmental dose-optimization program which includes automated exposure control, adjustment of the mA and/or kV according to patient size and/or use of iterative reconstruction technique. CONTRAST:  62m OMNIPAQUE IOHEXOL 300 MG/ML  SOLN COMPARISON:  CT 04/04/2022 and older FINDINGS: Lower chest: New tiny bilateral pleural effusions are identified. Once again there are several small cavitary lesions in the lungs. Many of these are becoming more cavitary and less soft tissue  elements and are smaller. Specific exams will be followed. The 7 mm lesion on the prior in the right lower lobe, today on series 6, image 30 measures 6 mm. Again much less soft tissue component. Lesion just above this in the right lower lobe which measured 7 mm as well, today on image 19 of series 6 again 6 mm but much less soft tissue component. Small hiatal hernia. No pre cardiophrenic abnormal nodes. Hepatobiliary: Fatty liver  infiltration identified. Contracted gallbladder. Persistent biliary duct ectasia. The thickening and calcifications seen along the margin of the falciform ligament anteriorly are stable today on image 20 of series 2. The surface implants are improving. Focus anterior to the left hepatic lobe superiorly which had measured 18 x 11 mm on the prior, today on series 2, image 14 measures 13 by 7 mm. Please see sagittal series 5 image 52. Additional focus posteriorly in segment 7 on image 18 of series 2 is stable. There is a rind of soft tissue along the lateral margin of the liver extending towards the dome. Pancreas: Global atrophy without enhancing mass or ductal dilatation. Spleen: Spleen is slightly enlarged with cephalocaudal length of 13.1 cm, similar in retrospect when adjusting for technique. Adrenals/Urinary Tract: Adrenal glands are preserved. Extrarenal pelvis seen of the right kidney and left. No enhancing renal mass or collecting system filling defect. Bosniak 1 exophytic left-sided renal cyst is stable. Of interest the density of the lumen of the renal pelvis bilaterally is increased on the left compared to right in the portal venous phase. This is of uncertain etiology and significance. Please correlate with the urinalysis. The ureters have normal course and caliber down to the bladder which is with preserved contour. The bladder is underdistended. Stomach/Bowel: Diffuse colonic stool. Large bowel is nondilated. There is rectus muscle diastasis with defect at the umbilicus with herniation of loops of large bowel and small bowel without obstruction. This is a wide mouth hernia and is unchanged from previous. The small bowel is well is nondilated. Stomach is relatively collapsed. Vascular/Lymphatic: Diffuse vascular calcifications are seen. Preserved course and caliber of the aorta and IVC. Several enlarged nodes are identified. Specific lesions will be followed. This includes upper abdomen periceliac were  some of the nodes are enlarged but calcified. Specific lesions will be followed for continuity. The lymph node to the right of the celiac on the prior which measured 20 x 13 mm, today is less well defined but estimated on series 2, image 24 at 19 by 10 mm. The lesion just above this which measured 15 x 10 mm also with some calcification, today on image 23 of series 2 measures 12 x 12 mm. Other retroperitoneal nodes are smaller ill-defined with more stranding but relatively similar in size. Bilateral inguinal nodes are again seen as well with some calcification. The right-sided focus which measured 18 x 14 mm, today on image 78 of series 2 measures 17 by 14 mm. Left-sided node which measured 14 x 17 mm on series 2, image 75 measures 14 x 16 mm. No new nodal enlargement. Reproductive: Status post hysterectomy. No adnexal masses. Other: No developing significant ascites. There is a small cystic focus identified the central mesentery on image 36 of series 2. This appears relatively simple measuring 15 by 12 mm with Hounsfield units of 13. There are some small areas of mesenteric nodularity which appear relatively similar to previous such as on the right side on image 41 measuring 14 by 9 mm, central pelvis image  60 of series 2 measuring 13 by 9 mm. This also could represent prominent nodes. No definite new areas of peritoneal nodularity at this time Musculoskeletal: Mild degenerative changes of the spine and pelvis. Mild skin thickening along the anterior pelvic wall with some anasarca. IMPRESSION: 1. New tiny bilateral pleural effusions. 2. Once again there are several small cavitary lesions in the lungs. Many of these are becoming more cavitary with less soft tissue elements and are smaller today. 3. Soft tissue thickening and nodularity along the surface of the liver appears to be overall decreasing from previous examination. Some of these areas have calcification. 4. Mesenteric nodules as well as retroperitoneal and  mesenteric nodes appear similar to slightly improved. The inguinal nodes seen previously are similar. 5. No developing new mass lesion, ascites or lymph node enlargement seen. 6. Of interest the density of the lumen of the renal pelvis bilaterally is increased on the left compared to the right in the portal venous phase. This is of uncertain etiology and significance. Please correlate with the urinalysis such as for hematuria. Electronically Signed   By: Jill Side M.D.   On: 06/05/2022 13:40

## 2022-06-20 DIAGNOSIS — N3000 Acute cystitis without hematuria: Secondary | ICD-10-CM | POA: Diagnosis not present

## 2022-06-20 DIAGNOSIS — N179 Acute kidney failure, unspecified: Secondary | ICD-10-CM | POA: Diagnosis not present

## 2022-06-20 DIAGNOSIS — E860 Dehydration: Secondary | ICD-10-CM | POA: Diagnosis not present

## 2022-06-20 LAB — CBC WITH DIFFERENTIAL/PLATELET
Abs Immature Granulocytes: 0.2 10*3/uL — ABNORMAL HIGH (ref 0.00–0.07)
Basophils Absolute: 0 10*3/uL (ref 0.0–0.1)
Basophils Relative: 0 %
Eosinophils Absolute: 0 10*3/uL (ref 0.0–0.5)
Eosinophils Relative: 0 %
HCT: 25.7 % — ABNORMAL LOW (ref 36.0–46.0)
Hemoglobin: 7.9 g/dL — ABNORMAL LOW (ref 12.0–15.0)
Lymphocytes Relative: 24 %
Lymphs Abs: 0.8 10*3/uL (ref 0.7–4.0)
MCH: 31 pg (ref 26.0–34.0)
MCHC: 30.7 g/dL (ref 30.0–36.0)
MCV: 100.8 fL — ABNORMAL HIGH (ref 80.0–100.0)
Monocytes Absolute: 0.7 10*3/uL (ref 0.1–1.0)
Monocytes Relative: 20 %
Myelocytes: 6 %
Neutro Abs: 1.7 10*3/uL (ref 1.7–7.7)
Neutrophils Relative %: 50 %
Platelets: 108 10*3/uL — ABNORMAL LOW (ref 150–400)
RBC: 2.55 MIL/uL — ABNORMAL LOW (ref 3.87–5.11)
RDW: 17.3 % — ABNORMAL HIGH (ref 11.5–15.5)
WBC: 3.4 10*3/uL — ABNORMAL LOW (ref 4.0–10.5)
nRBC: 0.6 % — ABNORMAL HIGH (ref 0.0–0.2)

## 2022-06-20 LAB — BASIC METABOLIC PANEL
Anion gap: 6 (ref 5–15)
BUN: 11 mg/dL (ref 8–23)
CO2: 19 mmol/L — ABNORMAL LOW (ref 22–32)
Calcium: 8.1 mg/dL — ABNORMAL LOW (ref 8.9–10.3)
Chloride: 107 mmol/L (ref 98–111)
Creatinine, Ser: 1 mg/dL (ref 0.44–1.00)
GFR, Estimated: >60 mL/min (ref >60)
Glucose, Bld: 76 mg/dL (ref 70–99)
Potassium: 4.1 mmol/L (ref 3.5–5.1)
Sodium: 132 mmol/L — ABNORMAL LOW (ref 135–145)

## 2022-06-20 MED ORDER — CEFADROXIL 500 MG PO CAPS: 500.0000 mg | ORAL_CAPSULE | Freq: Two times a day (BID) | ORAL | 0 refills | Status: AC

## 2022-06-20 MED ORDER — HEPARIN SOD (PORK) LOCK FLUSH 100 UNIT/ML IV SOLN
500.0000 [IU] | INTRAVENOUS | Status: DC | PRN
  Filled 2022-06-20: qty 5

## 2022-06-20 NOTE — Progress Notes (Signed)
Holly [name]   DOB:16-Oct-1955   [id number]    ASSESSMENT & PLAN:   Recurrent fallopian tube cancer Continue supportive care for now Her next chemotherapy is not due for another 2 weeks   Acquired pancytopenia Due to recent chemotherapy She does not need transfusion support today G-CSF was started on 2/26, resolved Anemia and thrombocytopenia is stable, no transfusion needed I plan to reduce dose of chemotherapy in future cycles and at G-CSF support   Severe dehydration, urinary tract infection Improved.  I recommend total of 7 days of antibiotics   Possible stye/eyelid infection This is improving on antibiotics   Chronic kidney disease, hyponatremia, uncontrolled hypertension Continue intermittent antihypertensive treatment   Intermittent loose stool This has resolved   Discharge planning She can be discharged today All questions were answered. The patient knows to call the clinic with any problems, questions or concerns.   The total time spent in the appointment was 25 minutes encounter with patients including review of chart and various tests results, discussions about plan of care and coordination of care plan  Heath Lark, MD 06/20/2022 8:52 AM  Subjective:  She felt better.  Able to eat and drink without difficulties.  No further diarrhea  Objective:  Vitals:   06/19/22 2032 06/20/22 0452  BP: (!) 161/76 (!) 155/74  Pulse: 66 65  Resp: 18 18  Temp: 98 F (36.7 C) 98.2 F (36.8 C)  SpO2: 95% 98%     Intake/Output Summary (Last 24 hours) at 06/20/2022 0852 Last data filed at 06/19/2022 1500 Gross per 24 hour  Intake 567.23 ml  Output --  Net 567.23 ml    GENERAL:alert, no distress and comfortable  NEURO: alert & oriented x 3 with fluent speech, no focal motor/sensory deficits   Labs:  Recent Labs    06/17/22 1136 06/18/22 0624 06/19/22 0500 06/20/22 0516  NA 125* 131* 136 132*  K 3.7 3.3* 4.6 4.1  CL 94* 103 111 107  CO2 21* 21* 19* 19*   GLUCOSE 122* 88 66* 76  BUN 26* '21 16 11  '$ CREATININE 1.20* 1.02* 0.97 1.00  CALCIUM 8.4* 7.8* 7.9* 8.1*  GFRNONAA 50* >60 >60 >60  PROT 7.1 5.0* 4.9*  --   ALBUMIN 3.3* 2.4* 2.3*  --   AST 35 36 24  --   ALT '15 19 17  '$ --   ALKPHOS 52 40 42  --   BILITOT 0.6 0.6 0.6  --     Studies:  DG Chest 2 View  Result Date: 06/17/2022 CLINICAL DATA:  Dyspnea on exertion. EXAM: CHEST - 2 VIEW COMPARISON:  None Available. FINDINGS: The heart size and mediastinal contours are within normal limits. Both lungs are clear. Right internal jugular Port-A-Cath is noted. The visualized skeletal structures are unremarkable. IMPRESSION: No active cardiopulmonary disease. Electronically Signed   By: Marijo Conception M.D.   On: 06/17/2022 12:53   CT ABDOMEN PELVIS W CONTRAST  Result Date: 06/05/2022 CLINICAL DATA:  Assess treatment response with chemotherapy for ovarian cancer. Shortness of breath on exertion. * Tracking Code: BO * EXAM: CT ABDOMEN AND PELVIS WITH CONTRAST TECHNIQUE: Multidetector CT imaging of the abdomen and pelvis was performed using the standard protocol following bolus administration of intravenous contrast. RADIATION DOSE REDUCTION: This exam was performed according to the departmental dose-optimization program which includes automated exposure control, adjustment of the mA and/or kV according to patient size and/or use of iterative reconstruction technique. CONTRAST:  22m OMNIPAQUE IOHEXOL 300 MG/ML  SOLN COMPARISON:  CT 04/04/2022 and older FINDINGS: Lower chest: New tiny bilateral pleural effusions are identified. Once again there are several small cavitary lesions in the lungs. Many of these are becoming more cavitary and less soft tissue elements and are smaller. Specific exams will be followed. The 7 mm lesion on the prior in the right lower lobe, today on series 6, image 30 measures 6 mm. Again much less soft tissue component. Lesion just above this in the right lower lobe which measured 7  mm as well, today on image 19 of series 6 again 6 mm but much less soft tissue component. Small hiatal hernia. No pre cardiophrenic abnormal nodes. Hepatobiliary: Fatty liver infiltration identified. Contracted gallbladder. Persistent biliary duct ectasia. The thickening and calcifications seen along the margin of the falciform ligament anteriorly are stable today on image 20 of series 2. The surface implants are improving. Focus anterior to the left hepatic lobe superiorly which had measured 18 x 11 mm on the prior, today on series 2, image 14 measures 13 by 7 mm. Please see sagittal series 5 image 52. Additional focus posteriorly in segment 7 on image 18 of series 2 is stable. There is a rind of soft tissue along the lateral margin of the liver extending towards the dome. Pancreas: Global atrophy without enhancing mass or ductal dilatation. Spleen: Spleen is slightly enlarged with cephalocaudal length of 13.1 cm, similar in retrospect when adjusting for technique. Adrenals/Urinary Tract: Adrenal glands are preserved. Extrarenal pelvis seen of the right kidney and left. No enhancing renal mass or collecting system filling defect. Bosniak 1 exophytic left-sided renal cyst is stable. Of interest the density of the lumen of the renal pelvis bilaterally is increased on the left compared to right in the portal venous phase. This is of uncertain etiology and significance. Please correlate with the urinalysis. The ureters have normal course and caliber down to the bladder which is with preserved contour. The bladder is underdistended. Stomach/Bowel: Diffuse colonic stool. Large bowel is nondilated. There is rectus muscle diastasis with defect at the umbilicus with herniation of loops of large bowel and small bowel without obstruction. This is a wide mouth hernia and is unchanged from previous. The small bowel is well is nondilated. Stomach is relatively collapsed. Vascular/Lymphatic: Diffuse vascular calcifications are  seen. Preserved course and caliber of the aorta and IVC. Several enlarged nodes are identified. Specific lesions will be followed. This includes upper abdomen periceliac were some of the nodes are enlarged but calcified. Specific lesions will be followed for continuity. The lymph node to the right of the celiac on the prior which measured 20 x 13 mm, today is less well defined but estimated on series 2, image 24 at 19 by 10 mm. The lesion just above this which measured 15 x 10 mm also with some calcification, today on image 23 of series 2 measures 12 x 12 mm. Other retroperitoneal nodes are smaller ill-defined with more stranding but relatively similar in size. Bilateral inguinal nodes are again seen as well with some calcification. The right-sided focus which measured 18 x 14 mm, today on image 78 of series 2 measures 17 by 14 mm. Left-sided node which measured 14 x 17 mm on series 2, image 75 measures 14 x 16 mm. No new nodal enlargement. Reproductive: Status post hysterectomy. No adnexal masses. Other: No developing significant ascites. There is a small cystic focus identified the central mesentery on image 36 of series 2. This appears relatively simple measuring 15  by 12 mm with Hounsfield units of 13. There are some small areas of mesenteric nodularity which appear relatively similar to previous such as on the right side on image 41 measuring 14 by 9 mm, central pelvis image 60 of series 2 measuring 13 by 9 mm. This also could represent prominent nodes. No definite new areas of peritoneal nodularity at this time Musculoskeletal: Mild degenerative changes of the spine and pelvis. Mild skin thickening along the anterior pelvic wall with some anasarca. IMPRESSION: 1. New tiny bilateral pleural effusions. 2. Once again there are several small cavitary lesions in the lungs. Many of these are becoming more cavitary with less soft tissue elements and are smaller today. 3. Soft tissue thickening and nodularity along  the surface of the liver appears to be overall decreasing from previous examination. Some of these areas have calcification. 4. Mesenteric nodules as well as retroperitoneal and mesenteric nodes appear similar to slightly improved. The inguinal nodes seen previously are similar. 5. No developing new mass lesion, ascites or lymph node enlargement seen. 6. Of interest the density of the lumen of the renal pelvis bilaterally is increased on the left compared to the right in the portal venous phase. This is of uncertain etiology and significance. Please correlate with the urinalysis such as for hematuria. Electronically Signed   By: Jill Side M.D.   On: 06/05/2022 13:40

## 2022-06-20 NOTE — Discharge Summary (Addendum)
Physician Discharge Summary   Patient: Holly [name] MRN: [id number] DOB: September 27, 1955  Admit date:     06/17/2022  Discharge date: 06/20/22  Discharge Physician: Alma Friendly   PCP: Mayra Neer, MD   Recommendations at discharge:   Follow-up with PCP in 1 week Follow-up with oncology as scheduled  Discharge Diagnoses: Principal Problem:   Dehydration Active Problems:   Other neutropenia ([place])   UTI (urinary tract infection)   Malnutrition of moderate degree    Hospital Course: Holly [name] is a 67 y.o. female with medical history significant for but not limited to hypertension, GERD, history of fallopian tubes adenocarcinoma with recurrent disease, history of postoperative nausea vomiting as well as other comorbidities who presents with worsening generalized weakness and fatigue, found to be in severe dehydration.  Patient was adequately hydrated, while inpatient, also found to have E. coli UTI.  Patient admitted for further management.  Oncology on board.   Today, patient denies any new complaints.  Appetite remains somewhat poor, but able to tolerate orally.  Denies any further nausea/vomiting or diarrhea.  Patient stable to be discharged to follow-up with PCP in 1 week and oncology as scheduled.   Assessment and Plan: No notes have been filed under this hospital service. Service: Hospitalist   Severe Dehydration Improved Encourage p.o. intake   AKI Metabolic Acidosis AKI has improved   E Coli UTI U/A done and showed a hazy appearance with moderate hemoglobin, large leukocytes, negative nitrite, many bacteria, present mucus, greater than 50 WBCs Urine culture showing greater than 100,000 E. coli  BC x 2 NGTD S/p ceftriaxone, will switch to cefadroxil to complete a total of 7 days of AB   Mucositis Continue fluconazole 100 mg p.o. daily   Diarrhea Resolved GI pathogen panel as well as C. Difficile both negative   HTN C/w Home Medications with  Atenolol 25 mg po Daily and Lisinopril 20 mg po Daily    Depression and Anxiety C/w Venlafaxine XR 37.5 mg po Daily    Acquired Pancytopenia Likely chemotherapy induced with some possible hemodilution  No bleeding noted D/C Granix as ANC >1   Recurrent fallopian tube adenocarcinoma Medical oncology, Dr Alvy Bimler, consulted Follow for next chemotherapy in another few weeks   Elevated TSH TSH 9.081 Free T4-->1   Hypoalbuminemia Nutritionist consulted    Consultants: Oncology Procedures performed: None Disposition: Home Diet recommendation:  Regular diet    DISCHARGE MEDICATION: Allergies as of 06/20/2022       Reactions   Dilaudid [hydromorphone Hcl] Nausea And Vomiting   Morphine And Related Nausea Only   Sudafed [pseudoephedrine Hcl] Anxiety, Other (See Comments)   Nervous/jittery        Medication List     STOP taking these medications    hydrochlorothiazide 25 MG tablet Commonly known as: HYDRODIURIL       TAKE these medications    [place] 0.035 % ophthalmic solution Generic drug: ketotifen Place 1 drop into both eyes 2 (two) times daily as needed (for irritation).   atenolol 25 MG tablet Commonly known as: TENORMIN TAKE 1 TABLET BY MOUTH EVERY DAY   augmented betamethasone dipropionate 0.05 % cream Commonly known as: DIPROLENE-AF Apply 1 application  topically 2 (two) times daily as needed (to affected sites- for redness or swelling).   cefadroxil 500 MG capsule Commonly known as: DURICEF Take 1 capsule (500 mg total) by mouth 2 (two) times daily for 7 doses.   cloNIDine 0.1 MG tablet Commonly known as:  CATAPRES TAKE 1 TABLET BY MOUTH EVERY DAY What changed:  when to take this reasons to take this   dexamethasone 4 MG tablet Commonly known as: DECADRON Take 2 tabs at the night before and 2 tab the morning of chemotherapy, every 3 weeks, by mouth x 6 cycles What changed:  how much to take how to take this when to take this additional  instructions   docusate sodium 100 MG capsule Commonly known as: COLACE Take 100 mg by mouth daily as needed for mild constipation.   doxazosin 4 MG tablet Commonly known as: CARDURA TAKE 1 TABLET BY MOUTH EVERY DAY   fluconazole 100 MG tablet Commonly known as: DIFLUCAN Take 1 tablet (100 mg total) by mouth daily.   lidocaine-prilocaine cream Commonly known as: EMLA Apply to affected area once daily as directed. What changed:  how much to take when to take this reasons to take this additional instructions   lisinopril 20 MG tablet Commonly known as: ZESTRIL TAKE 1 TABLET BY MOUTH EVERY DAY IN THE EVENING What changed:  how much to take how to take this when to take this   magic mouthwash w/lidocaine Soln Take 5 mLs by mouth 4 (four) times daily. Swish and spit. What changed: Another medication with the same name was removed. Continue taking this medication, and follow the directions you see here.   MiraLax 17 GM/SCOOP powder Generic drug: polyethylene glycol powder Take 17 g by mouth daily as needed for mild constipation.   ondansetron 8 MG tablet Commonly known as: Zofran Take 1 tablet (8 mg total) by mouth every 8 (eight) hours as needed for nausea or vomiting. Start on the third day after chemotherapy.   oxyCODONE 5 MG immediate release tablet Commonly known as: Oxy IR/ROXICODONE Take 1 tablet by mouth every 4 hours as needed for severe pain.   prochlorperazine 10 MG tablet Commonly known as: COMPAZINE Take 1 tablet (10 mg total) by mouth every 6 (six) hours as needed for nausea or vomiting.   sodium chloride 0.65 % Soln nasal spray Commonly known as: OCEAN Place 1 spray into both nostrils as needed for congestion.   traMADol 50 MG tablet Commonly known as: ULTRAM Take 50 mg by mouth every 6 (six) hours as needed.   triamcinolone cream 0.1 % Commonly known as: KENALOG Apply 1 Application topically 2 (two) times daily as needed (to affected areas- for  irritation).   Tylenol 8 Hour Arthritis Pain 650 MG CR tablet Generic drug: acetaminophen Take 650-1,300 mg by mouth every 8 (eight) hours as needed for pain. What changed: Another medication with the same name was removed. Continue taking this medication, and follow the directions you see here.   venlafaxine XR 37.5 MG 24 hr capsule Commonly known as: EFFEXOR-XR TAKE 1 CAPSULE BY MOUTH DAILY WITH BREAKFAST.        Follow-up Information     Mayra Neer, MD. Schedule an appointment as soon as possible for a visit in 1 week(s).   Specialty: Family Medicine Contact information: [address] [place] [address] [phone]                Discharge Exam: Danley Danker Weights   06/17/22 1113 06/18/22 0556  Weight: 65.8 kg 56.9 kg   General: NAD  Cardiovascular: S1, S2 present Respiratory: CTAB Abdomen: Soft, nontender, nondistended, bowel sounds present Musculoskeletal: No bilateral pedal edema noted Skin: Normal Psychiatry: Normal mood   Condition at discharge: stable  The results of  significant diagnostics from this hospitalization (including imaging, microbiology, ancillary and laboratory) are listed below for reference.   Imaging Studies: DG Chest 2 View  Result Date: 06/17/2022 CLINICAL DATA:  Dyspnea on exertion. EXAM: CHEST - 2 VIEW COMPARISON:  None Available. FINDINGS: The heart size and mediastinal contours are within normal limits. Both lungs are clear. Right internal jugular Port-A-Cath is noted. The visualized skeletal structures are unremarkable. IMPRESSION: No active cardiopulmonary disease. Electronically Signed   By: Marijo Conception M.D.   On: 06/17/2022 12:53   CT ABDOMEN PELVIS W CONTRAST  Result Date: 06/05/2022 CLINICAL DATA:  Assess treatment response with chemotherapy for ovarian cancer. Shortness of breath on exertion. * Tracking Code: BO * EXAM: CT ABDOMEN AND PELVIS WITH CONTRAST TECHNIQUE: Multidetector CT imaging of the  abdomen and pelvis was performed using the standard protocol following bolus administration of intravenous contrast. RADIATION DOSE REDUCTION: This exam was performed according to the departmental dose-optimization program which includes automated exposure control, adjustment of the mA and/or kV according to patient size and/or use of iterative reconstruction technique. CONTRAST:  76m OMNIPAQUE IOHEXOL 300 MG/ML  SOLN COMPARISON:  CT 04/04/2022 and older FINDINGS: Lower chest: New tiny bilateral pleural effusions are identified. Once again there are several small cavitary lesions in the lungs. Many of these are becoming more cavitary and less soft tissue elements and are smaller. Specific exams will be followed. The 7 mm lesion on the prior in the right lower lobe, today on series 6, image 30 measures 6 mm. Again much less soft tissue component. Lesion just above this in the right lower lobe which measured 7 mm as well, today on image 19 of series 6 again 6 mm but much less soft tissue component. Small hiatal hernia. No pre cardiophrenic abnormal nodes. Hepatobiliary: Fatty liver infiltration identified. Contracted gallbladder. Persistent biliary duct ectasia. The thickening and calcifications seen along the margin of the falciform ligament anteriorly are stable today on image 20 of series 2. The surface implants are improving. Focus anterior to the left hepatic lobe superiorly which had measured 18 x 11 mm on the prior, today on series 2, image 14 measures 13 by 7 mm. Please see sagittal series 5 image 52. Additional focus posteriorly in segment 7 on image 18 of series 2 is stable. There is a rind of soft tissue along the lateral margin of the liver extending towards the dome. Pancreas: Global atrophy without enhancing mass or ductal dilatation. Spleen: Spleen is slightly enlarged with cephalocaudal length of 13.1 cm, similar in retrospect when adjusting for technique. Adrenals/Urinary Tract: Adrenal glands are  preserved. Extrarenal pelvis seen of the right kidney and left. No enhancing renal mass or collecting system filling defect. Bosniak 1 exophytic left-sided renal cyst is stable. Of interest the density of the lumen of the renal pelvis bilaterally is increased on the left compared to right in the portal venous phase. This is of uncertain etiology and significance. Please correlate with the urinalysis. The ureters have normal course and caliber down to the bladder which is with preserved contour. The bladder is underdistended. Stomach/Bowel: Diffuse colonic stool. Large bowel is nondilated. There is rectus muscle diastasis with defect at the umbilicus with herniation of loops of large bowel and small bowel without obstruction. This is a wide mouth hernia and is unchanged from previous. The small bowel is well is nondilated. Stomach is relatively collapsed. Vascular/Lymphatic: Diffuse vascular calcifications are seen. Preserved course and caliber of the aorta and IVC. Several enlarged nodes  are identified. Specific lesions will be followed. This includes upper abdomen periceliac were some of the nodes are enlarged but calcified. Specific lesions will be followed for continuity. The lymph node to the right of the celiac on the prior which measured 20 x 13 mm, today is less well defined but estimated on series 2, image 24 at 19 by 10 mm. The lesion just above this which measured 15 x 10 mm also with some calcification, today on image 23 of series 2 measures 12 x 12 mm. Other retroperitoneal nodes are smaller ill-defined with more stranding but relatively similar in size. Bilateral inguinal nodes are again seen as well with some calcification. The right-sided focus which measured 18 x 14 mm, today on image 78 of series 2 measures 17 by 14 mm. Left-sided node which measured 14 x 17 mm on series 2, image 75 measures 14 x 16 mm. No new nodal enlargement. Reproductive: Status post hysterectomy. No adnexal masses. Other: No  developing significant ascites. There is a small cystic focus identified the central mesentery on image 36 of series 2. This appears relatively simple measuring 15 by 12 mm with Hounsfield units of 13. There are some small areas of mesenteric nodularity which appear relatively similar to previous such as on the right side on image 41 measuring 14 by 9 mm, central pelvis image 60 of series 2 measuring 13 by 9 mm. This also could represent prominent nodes. No definite new areas of peritoneal nodularity at this time Musculoskeletal: Mild degenerative changes of the spine and pelvis. Mild skin thickening along the anterior pelvic wall with some anasarca. IMPRESSION: 1. New tiny bilateral pleural effusions. 2. Once again there are several small cavitary lesions in the lungs. Many of these are becoming more cavitary with less soft tissue elements and are smaller today. 3. Soft tissue thickening and nodularity along the surface of the liver appears to be overall decreasing from previous examination. Some of these areas have calcification. 4. Mesenteric nodules as well as retroperitoneal and mesenteric nodes appear similar to slightly improved. The inguinal nodes seen previously are similar. 5. No developing new mass lesion, ascites or lymph node enlargement seen. 6. Of interest the density of the lumen of the renal pelvis bilaterally is increased on the left compared to the right in the portal venous phase. This is of uncertain etiology and significance. Please correlate with the urinalysis such as for hematuria. Electronically Signed   By: Jill Side M.D.   On: 06/05/2022 13:40    Microbiology: Results for orders placed or performed during the hospital encounter of 06/17/22  Blood culture (routine x 2)     Status: None (Preliminary result)   Collection Time: 06/17/22  4:30 PM   Specimen: BLOOD  Result Value Ref Range Status   Specimen Description   Final    BLOOD PORTA CATH Performed at [facility] [address], [place], [place] [address]    Special Requests   Final    BOTTLES DRAWN AEROBIC AND ANAEROBIC Blood Culture adequate volume Performed at [facility] [address] [place], [place] [address]    Culture   Final    NO GROWTH < 24 HOURS Performed at [facility] [address] [place], [place] [address]    Report Status PENDING  Incomplete  Urine Culture (for pregnant, neutropenic or urologic patients or patients with an indwelling urinary catheter)     Status: Abnormal   Collection Time: 06/17/22  4:47 PM   Specimen: Urine, Clean Catch  Result Value Ref Range Status   Specimen Description   Final    URINE, CLEAN CATCH Performed at [facility], [place] [address] [place], [place] [address]    Special Requests   Final    NONE Performed at [facility], [place] [address], [place], [place] [address]    Culture >=100,000 COLONIES/mL ESCHERICHIA COLI (A)  Final   Report Status 06/19/2022 FINAL  Final   Organism ID, Bacteria ESCHERICHIA COLI (A)  Final      Susceptibility   Escherichia coli - MIC*    AMPICILLIN <=2 SENSITIVE Sensitive     CEFAZOLIN <=4 SENSITIVE Sensitive     CEFEPIME <=0.12 SENSITIVE Sensitive     CEFTRIAXONE <=0.25 SENSITIVE Sensitive     CIPROFLOXACIN <=0.25 SENSITIVE Sensitive     GENTAMICIN <=1 SENSITIVE Sensitive     IMIPENEM <=0.25 SENSITIVE Sensitive     NITROFURANTOIN <=16 SENSITIVE Sensitive     TRIMETH/SULFA <=20 SENSITIVE Sensitive     AMPICILLIN/SULBACTAM <=2 SENSITIVE Sensitive     PIP/TAZO <=4 SENSITIVE Sensitive     * >=100,000 COLONIES/mL ESCHERICHIA COLI  Culture, blood (Routine X 2) w Reflex to ID Panel     Status: None (Preliminary result)   Collection Time: 06/17/22  9:51 PM   Specimen: BLOOD  Result Value Ref Range Status   Specimen Description   Final    BLOOD BLOOD LEFT ARM Performed at [facility] [address],  [place], [place] [address]    Special Requests   Final    BOTTLES DRAWN AEROBIC AND ANAEROBIC Blood Culture adequate volume Performed at [facility] [address], [place], [place] [address]    Culture   Final    NO GROWTH < 12 HOURS Performed at [facility] [address] [place], [place] [address]    Report Status PENDING  Incomplete  Gastrointestinal Panel by PCR , Stool     Status: None   Collection Time: 06/18/22  1:36 PM   Specimen: STOOL  Result Value Ref Range Status   Campylobacter species NOT DETECTED NOT DETECTED Final   Plesimonas shigelloides NOT DETECTED NOT DETECTED Final   Salmonella species NOT DETECTED NOT DETECTED Final   Yersinia enterocolitica NOT DETECTED NOT DETECTED Final   Vibrio species NOT DETECTED NOT DETECTED Final   Vibrio cholerae NOT DETECTED NOT DETECTED Final   Enteroaggregative E coli (EAEC) NOT DETECTED NOT DETECTED Final   Enteropathogenic E coli (EPEC) NOT DETECTED NOT DETECTED Final   Enterotoxigenic E coli (ETEC) NOT DETECTED NOT DETECTED Final   Shiga like toxin producing E coli (STEC) NOT DETECTED NOT DETECTED Final   Shigella/Enteroinvasive E coli (EIEC) NOT DETECTED NOT DETECTED Final   Cryptosporidium NOT DETECTED NOT DETECTED Final   Cyclospora cayetanensis NOT DETECTED NOT DETECTED Final   Entamoeba histolytica NOT DETECTED NOT DETECTED Final   Giardia lamblia NOT DETECTED NOT DETECTED Final   Adenovirus F40/41 NOT DETECTED NOT DETECTED Final   Astrovirus NOT DETECTED NOT DETECTED Final   Norovirus GI/GII NOT DETECTED NOT DETECTED Final   Rotavirus A NOT DETECTED NOT DETECTED Final   Sapovirus (I, II, IV, and V) NOT DETECTED NOT DETECTED Final    Comment: Performed at [facility], [address], [place], [place] [address]  C Difficile Quick Screen w PCR reflex     Status: None   Collection Time: 06/18/22  1:36 PM  Specimen: STOOL  Result Value Ref Range Status   C Diff antigen NEGATIVE  NEGATIVE Final   C Diff toxin NEGATIVE NEGATIVE Final   C Diff interpretation No C. difficile detected.  Final    Comment: Performed at [facility], [place] [address], [place], [place] [address]    Labs: CBC: Recent Labs  Lab 06/17/22 1448 06/18/22 0624 06/19/22 0500 06/20/22 0630  WBC 1.3* 1.4* 1.6* 3.4*  NEUTROABS 0.8* 0.8* 0.7* 1.7  HGB 7.7* 7.5* 7.8* 7.9*  HCT 23.9* 23.3* 24.5* 25.7*  MCV 98.4 99.1 100.8* 100.8*  PLT 84* 69* 80* 123XX123*   Basic Metabolic Panel: Recent Labs  Lab 06/17/22 1136 06/18/22 0624 06/19/22 0500 06/20/22 0516  NA 125* 131* 136 132*  K 3.7 3.3* 4.6 4.1  CL 94* 103 111 107  CO2 21* 21* 19* 19*  GLUCOSE 122* 88 66* 76  BUN 26* '21 16 11  '$ CREATININE 1.20* 1.02* 0.97 1.00  CALCIUM 8.4* 7.8* 7.9* 8.1*  MG  --  1.4* 2.4  --   PHOS  --  2.8 3.1  --    Liver Function Tests: Recent Labs  Lab 06/17/22 1136 06/18/22 0624 06/19/22 0500  AST 35 36 24  ALT '15 19 17  '$ ALKPHOS 52 40 42  BILITOT 0.6 0.6 0.6  PROT 7.1 5.0* 4.9*  ALBUMIN 3.3* 2.4* 2.3*   CBG: Recent Labs  Lab 06/17/22 1115 06/18/22 0736 06/19/22 0720 06/19/22 0755  GLUCAP 106* 93 66* 75    Discharge time spent: greater than 30 minutes.  Signed: Alma Friendly, MD [facility] 06/20/2022

## 2022-06-21 ENCOUNTER — Other Ambulatory Visit: Payer: Self-pay | Admitting: Hematology and Oncology

## 2022-06-21 ENCOUNTER — Telehealth: Payer: Self-pay

## 2022-06-21 DIAGNOSIS — C5701 Malignant neoplasm of right fallopian tube: Secondary | ICD-10-CM

## 2022-06-21 DIAGNOSIS — Z7189 Other specified counseling: Secondary | ICD-10-CM

## 2022-06-21 NOTE — Telephone Encounter (Signed)
Called to see how she is doing at home. She is doing well so far. Eating and drinking. No complaints at this time except being a little weak. She is moving around the house. She appreciated the office calling and will call the office back for questions/ concerns.

## 2022-06-21 NOTE — Telephone Encounter (Signed)
-----   Message from Heath Lark, MD sent at 06/21/2022  9:10 AM EST ----- Can you call and check on how she is doing?

## 2022-06-22 LAB — CULTURE, BLOOD (ROUTINE X 2)
Culture: NO GROWTH
Special Requests: ADEQUATE

## 2022-06-23 LAB — CULTURE, BLOOD (ROUTINE X 2)
Culture: NO GROWTH
Special Requests: ADEQUATE

## 2022-06-24 ENCOUNTER — Encounter: Payer: Self-pay | Admitting: Hematology and Oncology

## 2022-06-27 ENCOUNTER — Other Ambulatory Visit: Payer: Self-pay | Admitting: Hematology and Oncology

## 2022-06-28 MED FILL — Dexamethasone Sodium Phosphate Inj 100 MG/10ML: INTRAMUSCULAR | Qty: 1 | Status: AC

## 2022-06-28 MED FILL — Fosaprepitant Dimeglumine For IV Infusion 150 MG (Base Eq): INTRAVENOUS | Qty: 5 | Status: AC

## 2022-07-01 ENCOUNTER — Inpatient Hospital Stay: Payer: Medicare Other | Attending: Hematology and Oncology

## 2022-07-01 ENCOUNTER — Inpatient Hospital Stay: Payer: Medicare Other

## 2022-07-01 ENCOUNTER — Encounter: Payer: Self-pay | Admitting: Hematology and Oncology

## 2022-07-01 ENCOUNTER — Other Ambulatory Visit: Payer: Self-pay | Admitting: Hematology and Oncology

## 2022-07-01 ENCOUNTER — Inpatient Hospital Stay (HOSPITAL_BASED_OUTPATIENT_CLINIC_OR_DEPARTMENT_OTHER): Payer: Medicare Other | Admitting: Hematology and Oncology

## 2022-07-01 VITALS — BP 130/62 | HR 78 | Temp 97.6°F | Resp 18 | Ht 65.0 in | Wt 146.2 lb

## 2022-07-01 DIAGNOSIS — C5701 Malignant neoplasm of right fallopian tube: Secondary | ICD-10-CM | POA: Diagnosis not present

## 2022-07-01 DIAGNOSIS — D61818 Other pancytopenia: Secondary | ICD-10-CM | POA: Diagnosis not present

## 2022-07-01 DIAGNOSIS — Z5189 Encounter for other specified aftercare: Secondary | ICD-10-CM | POA: Insufficient documentation

## 2022-07-01 DIAGNOSIS — C786 Secondary malignant neoplasm of retroperitoneum and peritoneum: Secondary | ICD-10-CM | POA: Insufficient documentation

## 2022-07-01 DIAGNOSIS — Z5111 Encounter for antineoplastic chemotherapy: Secondary | ICD-10-CM | POA: Insufficient documentation

## 2022-07-01 DIAGNOSIS — Z7189 Other specified counseling: Secondary | ICD-10-CM | POA: Diagnosis not present

## 2022-07-01 DIAGNOSIS — C7989 Secondary malignant neoplasm of other specified sites: Secondary | ICD-10-CM | POA: Diagnosis not present

## 2022-07-01 DIAGNOSIS — I10 Essential (primary) hypertension: Secondary | ICD-10-CM | POA: Diagnosis not present

## 2022-07-01 DIAGNOSIS — C563 Malignant neoplasm of bilateral ovaries: Secondary | ICD-10-CM | POA: Insufficient documentation

## 2022-07-01 LAB — CBC WITH DIFFERENTIAL (CANCER CENTER ONLY)
Abs Immature Granulocytes: 0.03 10*3/uL (ref 0.00–0.07)
Basophils Absolute: 0 10*3/uL (ref 0.0–0.1)
Basophils Relative: 1 %
Eosinophils Absolute: 0 10*3/uL (ref 0.0–0.5)
Eosinophils Relative: 1 %
HCT: 27.1 % — ABNORMAL LOW (ref 36.0–46.0)
Hemoglobin: 8.7 g/dL — ABNORMAL LOW (ref 12.0–15.0)
Immature Granulocytes: 1 %
Lymphocytes Relative: 21 %
Lymphs Abs: 0.7 10*3/uL (ref 0.7–4.0)
MCH: 32.7 pg (ref 26.0–34.0)
MCHC: 32.1 g/dL (ref 30.0–36.0)
MCV: 101.9 fL — ABNORMAL HIGH (ref 80.0–100.0)
Monocytes Absolute: 0.4 10*3/uL (ref 0.1–1.0)
Monocytes Relative: 11 %
Neutro Abs: 2.4 10*3/uL (ref 1.7–7.7)
Neutrophils Relative %: 65 %
Platelet Count: 186 10*3/uL (ref 150–400)
RBC: 2.66 MIL/uL — ABNORMAL LOW (ref 3.87–5.11)
RDW: 18.6 % — ABNORMAL HIGH (ref 11.5–15.5)
WBC Count: 3.6 10*3/uL — ABNORMAL LOW (ref 4.0–10.5)
nRBC: 0 % (ref 0.0–0.2)

## 2022-07-01 MED ORDER — SODIUM CHLORIDE 0.9 % IV SOLN: Freq: Once | INTRAVENOUS | Status: AC

## 2022-07-01 MED ORDER — SODIUM CHLORIDE 0.9 % IV SOLN
150.0000 mg | Freq: Once | INTRAVENOUS | Status: AC
  Administered 2022-07-01: 150 mg via INTRAVENOUS
  Filled 2022-07-01: qty 150

## 2022-07-01 MED ORDER — PALONOSETRON HCL INJECTION 0.25 MG/5ML
0.2500 mg | Freq: Once | INTRAVENOUS | Status: AC
  Administered 2022-07-01: 0.25 mg via INTRAVENOUS
  Filled 2022-07-01: qty 5

## 2022-07-01 MED ORDER — SODIUM CHLORIDE 0.9 % IV SOLN
10.0000 mg | Freq: Once | INTRAVENOUS | Status: AC
  Administered 2022-07-01: 10 mg via INTRAVENOUS
  Filled 2022-07-01: qty 10

## 2022-07-01 MED ORDER — SODIUM CHLORIDE 0.9 % IV SOLN
259.0000 mg | Freq: Once | INTRAVENOUS | Status: AC
  Administered 2022-07-01: 260 mg via INTRAVENOUS
  Filled 2022-07-01: qty 26

## 2022-07-01 MED ORDER — SODIUM CHLORIDE 0.9 % IV SOLN
87.5000 mg/m2 | Freq: Once | INTRAVENOUS | Status: AC
  Administered 2022-07-01: 144 mg via INTRAVENOUS
  Filled 2022-07-01: qty 24

## 2022-07-01 MED ORDER — SODIUM CHLORIDE 0.9% FLUSH
10.0000 mL | INTRAVENOUS | Status: DC | PRN
  Administered 2022-07-01: 10 mL

## 2022-07-01 MED ORDER — CETIRIZINE HCL 10 MG/ML IV SOLN
10.0000 mg | Freq: Once | INTRAVENOUS | Status: AC
  Administered 2022-07-01: 10 mg via INTRAVENOUS
  Filled 2022-07-01: qty 1

## 2022-07-01 MED ORDER — FAMOTIDINE IN NACL 20-0.9 MG/50ML-% IV SOLN
20.0000 mg | Freq: Once | INTRAVENOUS | Status: AC
  Administered 2022-07-01: 20 mg via INTRAVENOUS
  Filled 2022-07-01: qty 50

## 2022-07-01 MED ORDER — HEPARIN SOD (PORK) LOCK FLUSH 100 UNIT/ML IV SOLN
500.0000 [IU] | Freq: Once | INTRAVENOUS | Status: AC | PRN
  Administered 2022-07-01: 500 [IU]

## 2022-07-01 MED ORDER — SODIUM CHLORIDE 0.9% FLUSH
10.0000 mL | Freq: Once | INTRAVENOUS | Status: AC
  Administered 2022-07-01: 10 mL

## 2022-07-01 NOTE — Assessment & Plan Note (Signed)
Her recent CT imaging show positive response to therapy However, she tolerated treatment very poorly with uncontrolled hypertension, mucositis, severe pancytopenia and others I plan to reduce the dose of chemotherapy Plan to omit bevacizumab for the next few cycles until her blood pressure is back to normal I recommend the patient to return next week to get her blood count checked in case she need transfusion support

## 2022-07-01 NOTE — Progress Notes (Signed)
Nutrition Follow-up:  Patient with adenocarcinoma of right fallopian tube.  Patient receiving carboplatinum and taxol dose reduced.  Noted hospital admission for UTI, dehydration.  Met with patient in infusion. Patient reports that her appetite is better after being discharged from hospital.  Appetite over this past week has been better than it has been in the last month.  Has been able to eat steak, corn on cob, nacho with cheese, lettuce, tomato and chicken, cereal, waffle, pot tart.  Mouth sores have resolved. Reports dry mouth.      Medications: reviewed  Labs: reviewed  Anthropometrics:   Weight 146 lb 3.2 oz today 145 lb 9.6 oz on 2/19 150 lb 6.4 oz on 1/29 152 lb on 04/29/22  NUTRITION DIAGNOSIS: Inadequate oral intake continues   INTERVENTION:  Discussed strategies to help with dry mouth.  Handout provided    MONITORING, EVALUATION, GOAL: weight trends, intake   NEXT VISIT: Tuesday, April 2nd during infusion with Holly [name], [place], [place] Registered Dietitian [phone]

## 2022-07-01 NOTE — Progress Notes (Signed)
[name] OFFICE PROGRESS NOTE  Patient Care Team: Mayra Neer, MD as PCP - General (Family Medicine) Heath Lark, MD as Consulting Physician (Hematology and Oncology) Everitt Amber, MD as Consulting Physician (Obstetrics and Gynecology)  ASSESSMENT & PLAN:  Adenocarcinoma of right fallopian tube [facility]) Her recent CT imaging show positive response to therapy However, she tolerated treatment very poorly with uncontrolled hypertension, mucositis, severe pancytopenia and others I plan to reduce the dose of chemotherapy Plan to omit bevacizumab for the next few cycles until her blood pressure is back to normal I recommend the patient to return next week to get her blood count checked in case she need transfusion support  Essential hypertension She has uncontrolled hypertension despite recent dose adjustment of bevacizumab She will continue her antihypertensives I plan to omit bevacizumab for the next few cycles until her blood pressure normalized  Pancytopenia, acquired ([place]) This is related to severe bone marrow suppression from chemotherapy As previously discussed, we will proceed with dose adjustment Next week, she will return to get her blood count checked and transfusion as needed  Orders Placed This Encounter  Procedures   CBC with Differential/Platelet    Standing Status:   Standing    Number of Occurrences:   22    Standing Expiration Date:   07/01/2023   Sample to Blood Bank    Standing Status:   Standing    Number of Occurrences:   33    Standing Expiration Date:   07/01/2023    All questions were answered. The patient knows to call the clinic with any problems, questions or concerns. The total time spent in the appointment was 30 minutes encounter with patients including review of chart and various tests results, discussions about plan of care and coordination of care plan   Heath Lark, MD 07/01/2022 9:49 AM  INTERVAL HISTORY: Please see below for  problem oriented charting. she returns for treatment follow-up with her husband She felt better She is able to eat and drink better She complains of dry mouth No further mucositis No recent bleeding Her energy level is fair Her documented blood pressure from home is reviewed which showed persistent intermittent elevated blood pressure  REVIEW OF SYSTEMS:   Constitutional: Denies fevers, chills or abnormal weight loss Eyes: Denies blurriness of vision Ears, nose, mouth, throat, and face: Denies mucositis or sore throat Respiratory: Denies cough, dyspnea or wheezes Cardiovascular: Denies palpitation, chest discomfort or lower extremity swelling Gastrointestinal:  Denies nausea, heartburn or change in bowel habits Skin: Denies abnormal skin rashes Lymphatics: Denies new lymphadenopathy or easy bruising Neurological:Denies numbness, tingling or new weaknesses Behavioral/Psych: Mood is stable, no new changes  All other systems were reviewed with the patient and are negative.  I have reviewed the past medical history, past surgical history, social history and family history with the patient and they are unchanged from previous note.  ALLERGIES:  is allergic to dilaudid [hydromorphone hcl], morphine and related, and sudafed [pseudoephedrine hcl].  MEDICATIONS:  Current Outpatient Medications  Medication Sig Dispense Refill   ALAWAY 0.035 % ophthalmic solution Place 1 drop into both eyes 2 (two) times daily as needed (for irritation).     atenolol (TENORMIN) 25 MG tablet TAKE 1 TABLET BY MOUTH EVERY DAY (Patient taking differently: Take 25 mg by mouth daily.) 30 tablet 2   augmented betamethasone dipropionate (DIPROLENE-AF) 0.05 % cream Apply 1 application  topically 2 (two) times daily as needed (to affected sites- for redness or swelling).  cloNIDine (CATAPRES) 0.1 MG tablet TAKE 1 TABLET BY MOUTH EVERY DAY (Patient taking differently: Take 0.1 mg by mouth daily as needed (if Systolic  number is [id number] or greater).) 30 tablet 1   dexamethasone (DECADRON) 4 MG tablet Take 2 tabs at the night before and 2 tab the morning of chemotherapy, every 3 weeks, by mouth x 6 cycles (Patient taking differently: Take 8 mg by mouth See admin instructions. Take 8 mg (2 tablets) by mouth the night before and morning of chemotherapy- every 3 weeks, for 6 cycles) 36 tablet 6   docusate sodium (COLACE) 100 MG capsule Take 100 mg by mouth daily as needed for mild constipation.     doxazosin (CARDURA) 4 MG tablet TAKE 1 TABLET BY MOUTH EVERY DAY 30 tablet 1   lidocaine-prilocaine (EMLA) cream Apply to affected area once daily as directed. (Patient taking differently: 1 application  daily as needed (as directed- to access port).) 30 g 3   lisinopril (ZESTRIL) 20 MG tablet Take 1 tablet (20 mg total) by mouth 2 (two) times daily. 60 tablet 1   MIRALAX 17 GM/SCOOP powder Take 17 g by mouth daily as needed for mild constipation.     ondansetron (ZOFRAN) 8 MG tablet Take 1 tablet (8 mg total) by mouth every 8 (eight) hours as needed for nausea or vomiting. Start on the third day after chemotherapy. 30 tablet 1   oxyCODONE (OXY IR/ROXICODONE) 5 MG immediate release tablet Take 1 tablet by mouth every 4 hours as needed for severe pain. 60 tablet 0   prochlorperazine (COMPAZINE) 10 MG tablet Take 1 tablet (10 mg total) by mouth every 6 (six) hours as needed for nausea or vomiting. 30 tablet 1   sodium chloride (OCEAN) 0.65 % SOLN nasal spray Place 1 spray into both nostrils as needed for congestion.     triamcinolone cream (KENALOG) 0.1 % Apply 1 Application topically 2 (two) times daily as needed (to affected areas- for irritation).     TYLENOL 8 HOUR ARTHRITIS PAIN 650 MG CR tablet Take 650-1,300 mg by mouth every 8 (eight) hours as needed for pain.     venlafaxine XR (EFFEXOR-XR) 37.5 MG 24 hr capsule TAKE 1 CAPSULE BY MOUTH DAILY WITH BREAKFAST. (Patient taking differently: Take 37.5 mg by mouth daily with  breakfast.) 90 capsule 3   No current facility-administered medications for this visit.   Facility-Administered Medications Ordered in Other Visits  Medication Dose Route Frequency Provider Last Rate Last Admin   CARBOplatin (PARAPLATIN) 260 mg in sodium chloride 0.9 % 250 mL chemo infusion  260 mg Intravenous Once Alvy Bimler, Harlei Lehrmann, MD       heparin lock flush 100 unit/mL  500 Units Intracatheter Once PRN Alvy Bimler, Carmellia Kreisler, MD       PACLitaxel (TAXOL) 144 mg in sodium chloride 0.9 % 250 mL chemo infusion (> '80mg'$ /m2)  87.5 mg/m2 (Treatment Plan Recorded) Intravenous Once Alvy Bimler, Tron Flythe, MD       sodium chloride flush (NS) 0.9 % injection 10 mL  10 mL Intravenous PRN Alvy Bimler, Jody Aguinaga, MD   10 mL at 02/11/17 0839   sodium chloride flush (NS) 0.9 % injection 10 mL  10 mL Intravenous PRN Alvy Bimler, Genoa Freyre, MD   10 mL at 03/04/17 1510   sodium chloride flush (NS) 0.9 % injection 10 mL  10 mL Intracatheter PRN Heath Lark, MD        SUMMARY OF ONCOLOGIC HISTORY: Oncology History Overview Note  Neg genetics 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 She denies vaginal bleeding or rectal bleeding.     08/12/2016 Imaging   Abdomen X-ray in the ER: Moderate colonic stool burden without evidence of enteric obstruction   11/13/2016 Imaging   TVUS was performed on 11/13/16 which showed a large hypoechoic lobular mass seen posterior to the uterus measuring 18.2x16.1x11.8cm, blood flow was seen along the periphery of the mass. It was considered to be likely to be a fibroid vs pelvic mass vs ovarian mass. Neither ovary was removed. Moderate amount of free fluid in the pelvis. THe uterus measures 4x10x4cm. The endometrium is 70m.    12/05/2016 Imaging   MR pelvis 1. Mild limitations as detailed above. 2. Heterogeneous pelvic mass is favored to arise  from the right ovary. Favor a solid ovarian neoplasm such as fibroma/Brenner's tumor. Given lesion size and heterogeneous T2 signal out, complicating torsion cannot be excluded. 3. Moderate abdominopelvic ascites, without specific evidence of peritoneal metastasis. Consider further evaluation with contrast-enhanced abdominopelvic CT.    12/26/2016 Pathology Results   1. Uterus, ovaries and fallopian tubes - ADENOCARCINOMA INVOLVING RIGHT FALLOPIAN TUBE, RIGHT AND LEFT OVARIES, UTERINE SEROSA AND PELVIC MASS. - SEE ONCOLOGY TABLE AND COMMENT. - UTERINE CERVIX, ENDOMETRIUM, MYOMETRIUM AND LEFT FALLOPIAN TUBE FREE OF TUMOR. 2. Omentum, resection for tumor - ADENOCARCINOMA. Microscopic Comment 1. ONCOLOGY TABLE - FALLOPIAN TUBE. SEE COMMENT 1. Specimen, including laterality: Uterus, bilateral adnexa, pelvic mass and omentum. 2. Procedure: Hysterectomy with bilateral salpingo-oophorectomy, pelvic mass excision and omentectomy. 3. Lymph node sampling performed: No 4. Tumor site: See comment. 5. Tumor location in fallopian tube: Right fallopian tube fimbria 6. Specimen integrity (intact/ruptured/disrupted): Intact 7. Tumor size (cm): 18 cm, see comment. 8. Histologic type: Adenocarcinoma 9. Grade: High grade 10. Microscopic tumor extension: Tumor involves right fallopian tube, right and left ovaries, uterine serosa, pelvic mass and omentum. 11. Margins: See comment. 12. Lymph-Vascular invasion: Present 13. Lymph nodes: # examined: 0; # positive: N/A 14. TNM: pT3c, pNX 15. FIGO Stage (based on pathologic findings, needs clinical correlation: III-C 16. Comments: There is an 18 cm pelvic mass which is a high grade adenocarcinoma and there is a 12.2 cm  segment of omentum which is extensively involved with adenocarcinoma. The tumor also involves the fimbria of the right fallopian tube and the parenchyma of the right and left ovaries as well as uterine serosa. The fimbria of the right fallopian has  intraepithelial atypia consistent with a precursor lesion and therefore this is most consistent with primary fallopian tube adenocarcinoma. A primary peritoneal serous carcinoma is a less likely possibility.    12/26/2016 Pathology Results   PERITONEAL/ASCITIC FLUID(SPECIMEN 1 OF 1 COLLECTED 12/26/16): POORLY DIFFERENTIATED ADENOCARCINOMA   12/26/2016 Surgery   Procedure(s) Performed: Exploratory laparotomy with total abdominal hysterectomy, bilateral salpingo-oophorectomy, omentectomy radical tumor debulking for ovarian cancer .   Surgeon: EThereasa Solo MD.      Operative Findings: 20cm left ovarian mass densely adherent to the posterior uterus, right tube and ovary, cervix, sigmoid colon. 10cm omental cake. 4L ascites. 152msize tumor nodules on the serosa of the terminal ileum and proximal sigmoid colon. 100m69modules on right diaphragm.    This represented an optimal cytoreduction (R1) with 100mm27mdules on intestine and diaphragm representing gross visible disease   01/16/2017 Procedure   Placement of single lumen port a cath  via right internal jugular vein. The catheter tip lies at the cavo-atrial junction. A power injectable port a cath was placed and is ready for immediate use.   01/17/2017 Imaging   1. 3.5 x 7.2 x 4.6 cm fluid collection along the vaginal cuff, posterior the bladder and extending into the right adnexal space. This lesion demonstrates rim enhancement. Imaging features could be related to a loculated postoperative seroma or hematoma. Superinfection cannot be excluded by CT. Given debulking surgery was 3 weeks ago, this entire structure is un likely to represent neoplasm, but peritoneal involvement could have this appearance. 2. Small fluid collection in the left para colic gutter without rim enhancement. 3. Irregular/nodular appearance of the peritoneal M in the anatomic pelvis with areas of subtle nodularity between the stomach and the spleen. Close attention in these regions on  follow-up recommended as metastatic disease is a concern. 4. Mildly enlarged hepatoduodenal ligament lymph node. Attention on follow-up recommended.   01/20/2017 Tumor Marker   Patient's tumor was tested for the following markers: CA-125 Results of the tumor marker test revealed 46.6   01/28/2017 Genetic Testing       Negative genetic testing on the [facility] panel.  The [facility] gene panel offered by [employer] includes sequencing and deletion/duplication testing of the following 28 genes: APC, ATM, BARD1, BMPR1A, BRCA1, BRCA2, BRIP1, CHD1, CDK4, CDKN2A, CHEK2, EPCAM (large rearrangement only), MLH1, MSH2, MSH6, MUTYH, NBN, PALB2, PMS2, PTEN, RAD51C, RAD51D, SMAD4, STK11, and TP53. Sequencing was performed for select regions of POLE and POLD1, and large rearrangement analysis was performed for select regions of GREM1. The report date is January 28, 2017.  HRD testing looking for genomic instability and BRCA mutations was negative.  The report date of this test is January 27, 2017.    01/31/2017 Tumor Marker   Patient's tumor was tested for the following markers: CA-125 Results of the tumor marker test revealed 22.4   03/04/2017 Tumor Marker   Patient's tumor was tested for the following markers: CA-125 Results of the tumor marker test revealed 13.8   04/18/2017 Tumor Marker   Patient's tumor was tested for the following markers: CA-125 Results of the tumor marker test revealed 11.9   05/05/2017 Tumor Marker   Patient's tumor was tested for the following markers: CA-125 Results of the tumor marker test revealed 12   05/26/2017 Tumor Marker   Patient's tumor was tested for the following markers: CA-125 Results of the tumor marker test revealed 10.6   05/26/2017 Imaging   1. A previously noted postoperative fluid collection in the low pelvis and residual ascites seen on the prior examination has resolved on today's study. No findings to suggest residual/recurrent disease  on today's examination. No definite solid organ metastasis identified in the abdomen or pelvis. No lymphadenopathy. 2. Aortic atherosclerosis.   06/10/2017 Procedure   Successful right IJ vein Port-A-Cath explant.   03/09/2018 Tumor Marker   Patient's tumor was tested for the following markers: CA-125 Results of the tumor marker test revealed 12.1   05/26/2018 Tumor Marker   Patient's tumor was tested for the following markers: CA-125 Results of the tumor marker test revealed 13.8   09/09/2018 Tumor Marker   Patient's tumor was tested for the following markers: CA-125 Results of the tumor marker test revealed 23.9   12/18/2018 Tumor Marker   Patient's tumor was tested for the following markers: CA-125 Results of the tumor marker test revealed 43.8   02/27/2019 - 02/28/2019 Hospital Admission  She was admitted for bowel obstruction   02/27/2019 Imaging   1. High-grade small bowel obstruction with transition point in the pelvis in an area of suspected desmoplastic reaction surrounding a 1.3 cm spiculated nodule in the mesentery, suspicious for carcinoid. There is hyperenhancement near the tip of the appendix and loss of the normal fat plane between it and the adjacent sigmoid colon, potentially the site of primary lesion. 2. Multiple new small subcentimeter hyperdense lesions scattered along the liver capsule, concerning for metastases. Enlarged hyperenhancing gastrohepatic and portacaval lymph nodes concerning for nodal metastases. 3. Very mild right hydroureteronephrosis to the level of the desmoplastic reaction in the pelvis, potentially involving the right ureter. 4. Infraumbilical ventral abdominal wall diastasis containing a small nondilated portion of transverse colon. 5. Trace ascites. 6. Cholelithiasis. 7.  Aortic atherosclerosis (ICD10-I70.0).     03/05/2019 Tumor Marker   Patient's tumor was tested for the following markers: CA-125 Results of the tumor marker test revealed  70.1   03/12/2019 Echocardiogram   IMPRESSIONS     1. Left ventricular ejection fraction, by visual estimation, is 60 to 65%. The left ventricle has normal function. There is no left ventricular hypertrophy.  2. Left ventricular diastolic parameters are consistent with Grade I diastolic dysfunction (impaired relaxation).  3. Global right ventricle has normal systolic function.The right ventricular size is normal. No increase in right ventricular wall thickness.  4. Left atrial size was normal.  5. Right atrial size was normal.  6. The pericardium was not assessed.  7. The mitral valve is normal in structure. No evidence of mitral valve regurgitation.  8. The tricuspid valve is normal in structure. Tricuspid valve regurgitation is trivial.  9. The aortic valve is normal in structure. Aortic valve regurgitation is not visualized. 10. The pulmonic valve was grossly normal. Pulmonic valve regurgitation is not visualized. 11. The average left ventricular global longitudinal strain is -17.6 %.   03/16/2019 Procedure   Successful placement of a right internal jugular approach power injectable Port-A-Cath. The catheter is ready for immediate use.     04/19/2019 Tumor Marker   Patient's tumor was tested for the following markers: CA-125 Results of the tumor marker test revealed 39.8   05/17/2019 Tumor Marker   Patient's tumor was tested for the following markers: CA-125 Results of the tumor marker test revealed 27   05/28/2019 Tumor Marker   Patient's tumor was tested for the following markers: CA-125 Results of the tumor marker test revealed 27.7.   06/11/2019 Imaging   1. No new or progressive metastatic disease in the abdomen or pelvis. 2. Tiny noncalcified perisplenic implant is decreased. Calcified pericardiophrenic, retroperitoneal and bilateral inguinal lymph nodes and scattered small calcified perihepatic and perisplenic implants are stable. 3.  Aortic Atherosclerosis (ICD10-I70.0).        06/17/2019 Echocardiogram    1. Left ventricular ejection fraction, by estimation, is 65 to 70%. The left ventricle has normal function. The left ventricle has no regional wall motion abnormalities. Left ventricular diastolic parameters were normal.  2. Right ventricular systolic function is normal. The right ventricular size is normal.  3. The mitral valve is normal in structure and function. No evidence of mitral valve regurgitation. No evidence of mitral stenosis.  4. The aortic valve is normal in structure and function. Aortic valve regurgitation is not visualized. No aortic stenosis is present.   06/21/2019 Tumor Marker   Patient's tumor was tested for the following markers: CA-125 Results of the tumor marker test revealed 18.7  07/19/2019 Tumor Marker   Patient's tumor was tested for the following markers: CA-125 Results of the tumor marker test revealed 16.7   08/30/2019 Tumor Marker   Patient's tumor was tested for the following markers: CA-125. Results of the tumor marker test revealed 13.9   09/15/2019 Echocardiogram    1. Left ventricular ejection fraction, by estimation, is 65 to 70%. The left ventricle has normal function. The left ventricle has no regional wall motion abnormalities. Left ventricular diastolic parameters are consistent with Grade I diastolic dysfunction (impaired relaxation). The average left ventricular global longitudinal strain is 18.1 %. The global longitudinal strain is normal.  2. Right ventricular systolic function is normal. The right ventricular size is normal.  3. The mitral valve is normal in structure. No evidence of mitral valve regurgitation. No evidence of mitral stenosis.  4. The aortic valve is normal in structure. Aortic valve regurgitation is not visualized. No aortic stenosis is present.  5. The inferior vena cava is normal in size with greater than 50% respiratory variability, suggesting right atrial pressure of 3 mmHg.     09/24/2019  Imaging   1. Stable exam. No new or progressive metastatic disease on today's study. 2. Small subcapsular hypodensity in the lateral spleen, new in the interval, but indeterminate. Attention on follow-up recommended. 3. The calcified upper abdominal and groin lymph nodes are stable as are the scattered calcified and noncalcified peritoneal implants. 4. Stable midline ventral hernia containing a short segment of colon without complicating features. 5. Aortic Atherosclerosis (ICD10-I70.0).     10/04/2019 - 09/21/2021 Chemotherapy   Patient is on Treatment Plan : ovarian Bevacizumab q21d     10/04/2019 Tumor Marker   Patient's tumor was tested for the following markers: CA-125. Results of the tumor marker test revealed 12.9.   11/01/2019 Tumor Marker   Patient's tumor was tested for the following markers: CA-125 Results of the tumor marker test revealed 15.4   12/13/2019 Tumor Marker   Patient's tumor was tested for the following markers: CA-125 Results of the tumor marker test revealed 14.8   12/31/2019 Imaging   1. Unchanged tiny peritoneal nodule adjacent to the spleen measuring 6 mm. Other previously noted peritoneal nodules are imperceptible on present examination. 2. Unchanged prominent, partially calcified portacaval lymph node and bilateral inguinal lymph nodes. 3. Unchanged subtle, nonspecific subcapsular lesion of the spleen.  4. No evidence of new metastatic disease in the abdomen or pelvis. 5. Status post hysterectomy. 6. Unchanged low midline ventral abdominal hernia containing a single loop of nonobstructed transverse colon. 7. Cholelithiasis. 8. Aortic Atherosclerosis (ICD10-I70.0).       01/03/2020 Tumor Marker   Patient's tumor was tested for the following markers: CA-125 Results of the tumor marker test revealed 11.7.   01/24/2020 Tumor Marker   Patient's tumor was tested for the following markers: CA-125. Results of the tumor marker test revealed 15.1   03/06/2020  Tumor Marker   Patient's tumor was tested for the following markers: CA-125. Results of the tumor marker test revealed 20.3   03/27/2020 Tumor Marker   Patient's tumor was tested for the following markers: CA-125 Results of the tumor marker test revealed 23.7   05/11/2020 Tumor Marker   Patient's tumor was tested for the following markers: CA-125. Results of the tumor marker test revealed 25.4   06/23/2020 Imaging   1. Cholelithiasis with gallbladder wall thickening and mild infiltrative edema in the porta hepatis, raising the possibility of acute cholecystitis. There is truncation of the common  bile duct distally and distal choledocholithiasis is difficult to exclude. Correlate with bilirubin levels. If clinically warranted, MRCP could be utilized for further characterization. 2. Stable tiny perisplenic nodule. 3. Other imaging findings of potential clinical significance: Small type 1 hiatal hernia. Prominent stool throughout the colon favors constipation. Ventral infraumbilical hernia contains transverse colon without findings of strangulation or obstruction. Small supraumbilical hernia contains adipose tissue. Lumbar degenerative disc disease at L5-S1. Stable tiny nodule along the lateral margin of the spleen, nonspecific. 4. Aortic atherosclerosis.     12/15/2020 Imaging   No evidence of recurrent or metastatic carcinoma within the abdomen or pelvis.   Stable ventral hernia containing transverse colon. No evidence of bowel obstruction or strangulation.   Cholelithiasis. No radiographic evidence of cholecystitis.   Tiny hiatal hernia.   Aortic Atherosclerosis (ICD10-I70.0).     06/11/2021 Imaging   1. Numerous sub 4 mm pulmonary nodules in the lungs some of which have a cavitary appearance. I think it is unlikely this is metastatic disease and more likely a possible immunotherapy related process such as a sarcoid like reaction. Recommend full chest CT further evaluation. 2. Stable  small calcified retroperitoneal and mesenteric lymph nodes. No findings suspicious for recurrent peritoneal carcinomatosis. 3. Stable lower abdominal wall hernia containing part of the transverse colon. 4. Cholelithiasis.   09/27/2021 Imaging   1. Numerous small pulmonary nodules throughout the lungs, the majority of which are cavitary. These are all subcentimeter, however nodules that were seen in the bilateral lung bases on prior CT dated 06/08/2021 appear slightly increased in size and number. Behavior is highly suspicious for pulmonary metastatic disease despite the unusual appearance, however as previously reported general differential considerations include atypical infection and inflammation, such as drug-induced sarcoidosis like reaction.  2. Unchanged calcified lymph nodes in the abdomen and pelvis, consistent with treated nodal metastases. 3. Cholelithiasis with wall thickening and pericholecystic fluid. This appearance is similar to prior examination and suggests chronic cholecystitis. Correlate for clinical symptoms. Mild intrahepatic biliary ductal dilatation, similar to prior examination. 4. Midline ventral hernia containing a single nonobstructed loop of mid transverse colon.     12/25/2021 Imaging   1. Again noted are multiple thin walled cavitary lung nodules throughout both lungs. These are similar in size and multiplicity when compared with the exam from 09/26/2021. Etiology remains indeterminate, although metastatic disease cannot be excluded with a high degree of certainty. 2. Stable calcified upper abdominal lymph nodes consistent with treated nodal metastases. 3. Gallstones. 4. Aortic Atherosclerosis (ICD10-I70.0).   01/30/2022 Tumor Marker   Patient's tumor was tested for the following markers: CA-125. Results of the tumor marker test revealed 59.3.   01/31/2022 Imaging   1. No substantial interval change in exam. 2. Innumerable tiny cavitary pulmonary nodules in the lung  bases. As noted previously, metastatic disease would be distinct consideration although imaging features are nonspecific and infectious/inflammatory etiology is not excluded. 3. Calcified nodal disease in the upper abdomen and pelvis shows no substantial interval change. Scattered noncalcified nodules in the mesentery are similar to prior. 4. Markedly large stool volume throughout the colon. Imaging features suggestive of clinical constipation. 5. Cholelithiasis with ill-defined appearance of the gallbladder wall. Appearance is stable in the interval. 6. Trace free fluid in the cul-de-sac. 7. Midline ventral hernia contains a short segment of transverse colon without complicating features. 8. Aortic Atherosclerosis (ICD10-I70.0).   03/07/2022 Surgery   Procedure Date:  03/07/2022  Pre-operative Diagnosis:  Symptomatic cholelithiasis   Post-operative Diagnosis: Symptomatic cholelithiasis, peritoneal  implants with history of right fallopian tube cancer.   Procedure:   Robotic assisted cholecystectomy with ICG FireFly cholangiogram Robotic assisted abdominal wall peritoneal implant excisional biopsy x 3.   Surgeon:  Melvyn Neth, MD  Specimens:   Gallbladder Peritoneal implants x 3    Indications for Procedure:  This is a 67 y.o. female who presents with abdominal pain and workup revealing symptomatic cholelithiasis.  The benefits, complications, treatment options, and expected outcomes were discussed with the patient. The risks of bleeding, infection, recurrence of symptoms, failure to resolve symptoms, bile duct damage, bile duct leak, retained common bile duct stone, bowel injury, and need for further procedures were all discussed with the patient and she was willing to proceed.     03/07/2022 Pathology Results   SURGICAL PATHOLOGY  CASE: [id number]  PATIENT: Tenna Child  Surgical Pathology Report   Specimen Submitted:  A. Gallbladder  B. Abdominal wall   Clinical  History: Symptomatic cholelithiasis   DIAGNOSIS:  A. GALLBLADDER; CHOLECYSTECTOMY:  - CHRONIC CHOLECYSTITIS WITH CHOLELITHIASIS.  - NEGATIVE FOR DYSPLASIA AND MALIGNANCY.  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   B. SOFT TISSUE, ABDOMINAL WALL; BIOPSY:  - POSITIVE FOR MALIGNANCY.  - METASTATIC CARCINOMA, COMPATIBLE WITH HIGH-GRADE SEROUS CARCINOMA OF GYNECOLOGIC PRIMARY.   Comment:  Immunohistochemical studies were performed on neoplastic cells in both sources. Tumor cells in both are positive for CK7, Pax-8, and WT-1, supporting the above diagnosis. P53 demonstrates strong and diffuse expression, indicative of mutated expression pattern.   There is sufficient tissue present for ancillary molecular testing if desired.    04/04/2022 Imaging   1. Mild progression of partially calcified peritoneal metastatic implants surrounding the liver, in the porta hepatis and in the periumbilical region. No generalized ascites or peritoneal nodularity status post omentectomy. 2. Grossly stable cavitary lesions at both lung bases, presumably reflecting metastatic disease. 3. No other evidence of progressive metastatic disease within the abdomen or pelvis. 4. Enlarging ventral hernia containing a portion of the transverse colon. No evidence of bowel obstruction or inflammation. 5.  Aortic Atherosclerosis (ICD10-I70.0).   04/08/2022 -  Chemotherapy   Patient is on Treatment Plan : OVARIAN Carboplatin + Paclitaxel + Bevacizumab q21d      04/09/2022 Tumor Marker   Patient's tumor was tested for the following markers: CA-125. Results of the tumor marker test revealed 86.7.   05/22/2022 Tumor Marker   Patient's tumor was tested for the following markers: CA-125. Results of the tumor marker test revealed 49.7.   06/05/2022 Imaging   1. New tiny bilateral pleural effusions. 2. Once again there are several small cavitary  lesions in the lungs. Many of these are becoming more cavitary with less soft tissue elements and are smaller today. 3. Soft tissue thickening and nodularity along the surface of the liver appears to be overall decreasing from previous examination. Some of these areas have calcification. 4. Mesenteric nodules as well as retroperitoneal and mesenteric nodes appear similar to slightly improved. The inguinal nodes seen previously are similar. 5. No developing new mass lesion, ascites or lymph node enlargement seen. 6. Of interest the density of the lumen of the renal pelvis bilaterally is increased on the left compared to the right in the portal venous phase. This is of uncertain etiology and significance. Please correlate with the urinalysis such as for hematuria.     PHYSICAL EXAMINATION: ECOG PERFORMANCE STATUS: 1 - Symptomatic but completely ambulatory  Vitals:  07/01/22 0821  BP: 130/62  Pulse: 78  Resp: 18  Temp: 97.6 F (36.4 C)  SpO2: (!) 77%   Filed Weights   07/01/22 0821  Weight: 146 lb 3.2 oz (66.3 kg)    GENERAL:alert, no distress and comfortable  NEURO: alert & oriented x 3 with fluent speech, no focal motor/sensory deficits  LABORATORY DATA:  I have reviewed the data as listed    Component Value Date/Time   NA 139 07/01/2022 0745   NA 139 04/14/2017 0742   K 4.2 07/01/2022 0745   K 4.0 04/14/2017 0742   CL 105 07/01/2022 0745   CO2 27 07/01/2022 0745   CO2 21 (L) 04/14/2017 0742   GLUCOSE 99 07/01/2022 0745   GLUCOSE 247 (H) 04/14/2017 0742   BUN 8 07/01/2022 0745   BUN 13.1 04/14/2017 0742   CREATININE 0.85 07/01/2022 0745   CREATININE 0.9 04/14/2017 0742   CALCIUM 8.4 (L) 07/01/2022 0745   CALCIUM 9.2 04/14/2017 0742   PROT 5.7 (L) 07/01/2022 0745   PROT 7.3 04/14/2017 0742   ALBUMIN 3.1 (L) 07/01/2022 0745   ALBUMIN 3.9 04/14/2017 0742   AST 13 (L) 07/01/2022 0745   AST 17 04/14/2017 0742   ALT 5 07/01/2022 0745   ALT 14 04/14/2017 0742    ALKPHOS 55 07/01/2022 0745   ALKPHOS 70 04/14/2017 0742   BILITOT 0.4 07/01/2022 0745   BILITOT 0.37 04/14/2017 0742   GFRNONAA >60 07/01/2022 0745   GFRAA >60 01/24/2020 0924   GFRAA >60 07/19/2019 0951    No results found for: "SPEP", "UPEP"  Lab Results  Component Value Date   WBC 3.6 (L) 07/01/2022   NEUTROABS 2.4 07/01/2022   HGB 8.7 (L) 07/01/2022   HCT 27.1 (L) 07/01/2022   MCV 101.9 (H) 07/01/2022   PLT 186 07/01/2022      Chemistry      Component Value Date/Time   NA 139 07/01/2022 0745   NA 139 04/14/2017 0742   K 4.2 07/01/2022 0745   K 4.0 04/14/2017 0742   CL 105 07/01/2022 0745   CO2 27 07/01/2022 0745   CO2 21 (L) 04/14/2017 0742   BUN 8 07/01/2022 0745   BUN 13.1 04/14/2017 0742   CREATININE 0.85 07/01/2022 0745   CREATININE 0.9 04/14/2017 0742      Component Value Date/Time   CALCIUM 8.4 (L) 07/01/2022 0745   CALCIUM 9.2 04/14/2017 0742   ALKPHOS 55 07/01/2022 0745   ALKPHOS 70 04/14/2017 0742   AST 13 (L) 07/01/2022 0745   AST 17 04/14/2017 0742   ALT 5 07/01/2022 0745   ALT 14 04/14/2017 0742   BILITOT 0.4 07/01/2022 0745   BILITOT 0.37 04/14/2017 0742       RADIOGRAPHIC STUDIES: I have personally reviewed the radiological images as listed and agreed with the findings in the report. DG Chest 2 View  Result Date: 06/17/2022 CLINICAL DATA:  Dyspnea on exertion. EXAM: CHEST - 2 VIEW COMPARISON:  None Available. FINDINGS: The heart size and mediastinal contours are within normal limits. Both lungs are clear. Right internal jugular Port-A-Cath is noted. The visualized skeletal structures are unremarkable. IMPRESSION: No active cardiopulmonary disease. Electronically Signed   By: Marijo Conception M.D.   On: 06/17/2022 12:53   CT ABDOMEN PELVIS W CONTRAST  Result Date: 06/05/2022 CLINICAL DATA:  Assess treatment response with chemotherapy for ovarian cancer. Shortness of breath on exertion. * Tracking Code: BO * EXAM: CT ABDOMEN AND PELVIS WITH  CONTRAST TECHNIQUE:  Multidetector CT imaging of the abdomen and pelvis was performed using the standard protocol following bolus administration of intravenous contrast. RADIATION DOSE REDUCTION: This exam was performed according to the departmental dose-optimization program which includes automated exposure control, adjustment of the mA and/or kV according to patient size and/or use of iterative reconstruction technique. CONTRAST:  24m OMNIPAQUE IOHEXOL 300 MG/ML  SOLN COMPARISON:  CT 04/04/2022 and older FINDINGS: Lower chest: New tiny bilateral pleural effusions are identified. Once again there are several small cavitary lesions in the lungs. Many of these are becoming more cavitary and less soft tissue elements and are smaller. Specific exams will be followed. The 7 mm lesion on the prior in the right lower lobe, today on series 6, image 30 measures 6 mm. Again much less soft tissue component. Lesion just above this in the right lower lobe which measured 7 mm as well, today on image 19 of series 6 again 6 mm but much less soft tissue component. Small hiatal hernia. No pre cardiophrenic abnormal nodes. Hepatobiliary: Fatty liver infiltration identified. Contracted gallbladder. Persistent biliary duct ectasia. The thickening and calcifications seen along the margin of the falciform ligament anteriorly are stable today on image 20 of series 2. The surface implants are improving. Focus anterior to the left hepatic lobe superiorly which had measured 18 x 11 mm on the prior, today on series 2, image 14 measures 13 by 7 mm. Please see sagittal series 5 image 52. Additional focus posteriorly in segment 7 on image 18 of series 2 is stable. There is a rind of soft tissue along the lateral margin of the liver extending towards the dome. Pancreas: Global atrophy without enhancing mass or ductal dilatation. Spleen: Spleen is slightly enlarged with cephalocaudal length of 13.1 cm, similar in retrospect when adjusting for  technique. Adrenals/Urinary Tract: Adrenal glands are preserved. Extrarenal pelvis seen of the right kidney and left. No enhancing renal mass or collecting system filling defect. Bosniak 1 exophytic left-sided renal cyst is stable. Of interest the density of the lumen of the renal pelvis bilaterally is increased on the left compared to right in the portal venous phase. This is of uncertain etiology and significance. Please correlate with the urinalysis. The ureters have normal course and caliber down to the bladder which is with preserved contour. The bladder is underdistended. Stomach/Bowel: Diffuse colonic stool. Large bowel is nondilated. There is rectus muscle diastasis with defect at the umbilicus with herniation of loops of large bowel and small bowel without obstruction. This is a wide mouth hernia and is unchanged from previous. The small bowel is well is nondilated. Stomach is relatively collapsed. Vascular/Lymphatic: Diffuse vascular calcifications are seen. Preserved course and caliber of the aorta and IVC. Several enlarged nodes are identified. Specific lesions will be followed. This includes upper abdomen periceliac were some of the nodes are enlarged but calcified. Specific lesions will be followed for continuity. The lymph node to the right of the celiac on the prior which measured 20 x 13 mm, today is less well defined but estimated on series 2, image 24 at 19 by 10 mm. The lesion just above this which measured 15 x 10 mm also with some calcification, today on image 23 of series 2 measures 12 x 12 mm. Other retroperitoneal nodes are smaller ill-defined with more stranding but relatively similar in size. Bilateral inguinal nodes are again seen as well with some calcification. The right-sided focus which measured 18 x 14 mm, today on image 78 of series 2  measures 17 by 14 mm. Left-sided node which measured 14 x 17 mm on series 2, image 75 measures 14 x 16 mm. No new nodal enlargement. Reproductive:  Status post hysterectomy. No adnexal masses. Other: No developing significant ascites. There is a small cystic focus identified the central mesentery on image 36 of series 2. This appears relatively simple measuring 15 by 12 mm with Hounsfield units of 13. There are some small areas of mesenteric nodularity which appear relatively similar to previous such as on the right side on image 41 measuring 14 by 9 mm, central pelvis image 60 of series 2 measuring 13 by 9 mm. This also could represent prominent nodes. No definite new areas of peritoneal nodularity at this time Musculoskeletal: Mild degenerative changes of the spine and pelvis. Mild skin thickening along the anterior pelvic wall with some anasarca. IMPRESSION: 1. New tiny bilateral pleural effusions. 2. Once again there are several small cavitary lesions in the lungs. Many of these are becoming more cavitary with less soft tissue elements and are smaller today. 3. Soft tissue thickening and nodularity along the surface of the liver appears to be overall decreasing from previous examination. Some of these areas have calcification. 4. Mesenteric nodules as well as retroperitoneal and mesenteric nodes appear similar to slightly improved. The inguinal nodes seen previously are similar. 5. No developing new mass lesion, ascites or lymph node enlargement seen. 6. Of interest the density of the lumen of the renal pelvis bilaterally is increased on the left compared to the right in the portal venous phase. This is of uncertain etiology and significance. Please correlate with the urinalysis such as for hematuria. Electronically Signed   By: Jill Side M.D.   On: 06/05/2022 13:40

## 2022-07-01 NOTE — Assessment & Plan Note (Signed)
This is related to severe bone marrow suppression from chemotherapy As previously discussed, we will proceed with dose adjustment Next week, she will return to get her blood count checked and transfusion as needed

## 2022-07-01 NOTE — Assessment & Plan Note (Signed)
She has uncontrolled hypertension despite recent dose adjustment of bevacizumab She will continue her antihypertensives I plan to omit bevacizumab for the next few cycles until her blood pressure normalized

## 2022-07-01 NOTE — Patient Instructions (Signed)
[facility]  Discharge Instructions: Thank you for choosing [facility] to provide your oncology and hematology care.   If you have a lab appointment with the Cancer Center, please go directly to the Cancer Center and check in at the registration area.   Wear comfortable clothing and clothing appropriate for easy access to any Portacath or PICC line.   We strive to give you quality time with your provider. You may need to reschedule your appointment if you arrive late (15 or more minutes).  Arriving late affects you and other patients whose appointments are after yours.  Also, if you miss three or more appointments without notifying the office, you may be dismissed from the clinic at the provider's discretion.      For prescription refill requests, have your pharmacy contact our office and allow 72 hours for refills to be completed.    Today you received the following chemotherapy and/or immunotherapy agents - Taxol/Carboplatin      To help prevent nausea and vomiting after your treatment, we encourage you to take your nausea medication as directed.  BELOW ARE SYMPTOMS THAT SHOULD BE REPORTED IMMEDIATELY: *FEVER GREATER THAN 100.4 F (38 C) OR HIGHER *CHILLS OR SWEATING *NAUSEA AND VOMITING THAT IS NOT CONTROLLED WITH YOUR NAUSEA MEDICATION *UNUSUAL SHORTNESS OF BREATH *UNUSUAL BRUISING OR BLEEDING *URINARY PROBLEMS (pain or burning when urinating, or frequent urination) *BOWEL PROBLEMS (unusual diarrhea, constipation, pain near the anus) TENDERNESS IN MOUTH AND THROAT WITH OR WITHOUT PRESENCE OF ULCERS (sore throat, sores in mouth, or a toothache) UNUSUAL RASH, SWELLING OR PAIN  UNUSUAL VAGINAL DISCHARGE OR ITCHING   Items with * indicate a potential emergency and should be followed up as soon as possible or go to the Emergency Department if any problems should occur.  Please show the CHEMOTHERAPY ALERT CARD or IMMUNOTHERAPY ALERT CARD  at check-in to the Emergency Department and triage nurse.  Should you have questions after your visit or need to cancel or reschedule your appointment, please contact [facility]  Dept: [phone]  and follow the prompts.  Office hours are 8:00 a.m. to 4:30 p.m. Monday - Friday. Please note that voicemails left after 4:00 p.m. may not be returned until the following business day.  We are closed weekends and major holidays. You have access to a nurse at all times for urgent questions. Please call the main number to the clinic Dept: [phone] and follow the prompts.   For any non-urgent questions, you may also contact your provider using MyChart. We now offer e-Visits for anyone 18 and older to request care online for non-urgent symptoms. For details visit [email or web address].   Also download the MyChart app! Go to the app store, search "MyChart", open the app, select [facility], and log in with your MyChart username and password.  

## 2022-07-02 ENCOUNTER — Encounter: Payer: Self-pay | Admitting: Hematology and Oncology

## 2022-07-02 ENCOUNTER — Other Ambulatory Visit: Payer: Self-pay

## 2022-07-02 LAB — CMP (CANCER CENTER ONLY)
ALT: 5 U/L — ABNORMAL LOW (ref 10–47)
AST: 13 U/L — ABNORMAL LOW (ref 15–41)
Albumin: 3.1 g/dL — ABNORMAL LOW (ref 3.5–5.0)
Alkaline Phosphatase: 55 U/L (ref 38–126)
Anion gap: 7 (ref 5–15)
BUN: 8 mg/dL (ref 8–23)
CO2: 27 mmol/L (ref 22–32)
Calcium: 8.4 mg/dL — ABNORMAL LOW (ref 8.9–10.3)
Chloride: 105 mmol/L (ref 98–111)
Creatinine: 0.85 mg/dL (ref 0.44–1.00)
GFR, Estimated: >60 mL/min (ref >60)
Glucose, Bld: 99 mg/dL (ref 70–99)
Potassium: 4.2 mmol/L (ref 3.5–5.1)
Sodium: 139 mmol/L (ref 135–145)
Total Bilirubin: 0.4 mg/dL (ref 0.3–1.2)
Total Protein: 5.7 g/dL — ABNORMAL LOW (ref 6.5–8.1)

## 2022-07-02 MED ORDER — CLONIDINE HCL 0.1 MG PO TABS: 0.1000 mg | ORAL_TABLET | Freq: Every day | ORAL | 1 refills | Status: DC

## 2022-07-03 ENCOUNTER — Inpatient Hospital Stay: Payer: Medicare Other

## 2022-07-03 VITALS — BP 152/70 | HR 58 | Temp 97.9°F | Resp 18

## 2022-07-03 DIAGNOSIS — Z5111 Encounter for antineoplastic chemotherapy: Secondary | ICD-10-CM | POA: Diagnosis not present

## 2022-07-03 DIAGNOSIS — C5701 Malignant neoplasm of right fallopian tube: Secondary | ICD-10-CM

## 2022-07-03 DIAGNOSIS — Z7189 Other specified counseling: Secondary | ICD-10-CM

## 2022-07-03 LAB — CA 125: Cancer Antigen (CA) 125: 56.1 U/mL — ABNORMAL HIGH (ref 0.0–38.1)

## 2022-07-03 MED ORDER — PEGFILGRASTIM-CBQV 6 MG/0.6ML ~~LOC~~ SOSY
6.0000 mg | PREFILLED_SYRINGE | Freq: Once | SUBCUTANEOUS | Status: AC
  Administered 2022-07-03: 6 mg via SUBCUTANEOUS
  Filled 2022-07-03: qty 0.6

## 2022-07-08 ENCOUNTER — Encounter: Payer: Self-pay | Admitting: Hematology and Oncology

## 2022-07-08 ENCOUNTER — Inpatient Hospital Stay: Payer: Medicare Other

## 2022-07-08 ENCOUNTER — Telehealth: Payer: Self-pay

## 2022-07-08 ENCOUNTER — Other Ambulatory Visit: Payer: Self-pay | Admitting: Hematology and Oncology

## 2022-07-08 DIAGNOSIS — D61818 Other pancytopenia: Secondary | ICD-10-CM

## 2022-07-08 DIAGNOSIS — Z7189 Other specified counseling: Secondary | ICD-10-CM

## 2022-07-08 DIAGNOSIS — Z5111 Encounter for antineoplastic chemotherapy: Secondary | ICD-10-CM | POA: Diagnosis not present

## 2022-07-08 DIAGNOSIS — C5701 Malignant neoplasm of right fallopian tube: Secondary | ICD-10-CM

## 2022-07-08 LAB — CBC WITH DIFFERENTIAL/PLATELET
Abs Immature Granulocytes: 0.15 10*3/uL — ABNORMAL HIGH (ref 0.00–0.07)
Basophils Absolute: 0.1 10*3/uL (ref 0.0–0.1)
Basophils Relative: 1 %
Eosinophils Absolute: 0.2 10*3/uL (ref 0.0–0.5)
Eosinophils Relative: 2 %
HCT: 28.7 % — ABNORMAL LOW (ref 36.0–46.0)
Hemoglobin: 9.2 g/dL — ABNORMAL LOW (ref 12.0–15.0)
Immature Granulocytes: 2 %
Lymphocytes Relative: 11 %
Lymphs Abs: 1 10*3/uL (ref 0.7–4.0)
MCH: 33.2 pg (ref 26.0–34.0)
MCHC: 32.1 g/dL (ref 30.0–36.0)
MCV: 103.6 fL — ABNORMAL HIGH (ref 80.0–100.0)
Monocytes Absolute: 0.5 10*3/uL (ref 0.1–1.0)
Monocytes Relative: 6 %
Neutro Abs: 7.2 10*3/uL (ref 1.7–7.7)
Neutrophils Relative %: 78 %
Platelets: 179 10*3/uL (ref 150–400)
RBC: 2.77 MIL/uL — ABNORMAL LOW (ref 3.87–5.11)
RDW: 17.5 % — ABNORMAL HIGH (ref 11.5–15.5)
WBC: 9.2 10*3/uL (ref 4.0–10.5)
nRBC: 0 % (ref 0.0–0.2)

## 2022-07-08 LAB — SAMPLE TO BLOOD BANK

## 2022-07-08 MED ORDER — SODIUM CHLORIDE 0.9 % IV SOLN: Freq: Once | INTRAVENOUS | Status: AC

## 2022-07-08 MED ORDER — HEPARIN SOD (PORK) LOCK FLUSH 100 UNIT/ML IV SOLN
500.0000 [IU] | Freq: Once | INTRAVENOUS | Status: AC | PRN
  Administered 2022-07-08: 500 [IU]

## 2022-07-08 MED ORDER — SODIUM CHLORIDE 0.9% FLUSH
10.0000 mL | Freq: Once | INTRAVENOUS | Status: AC | PRN
  Administered 2022-07-08: 10 mL

## 2022-07-08 NOTE — Telephone Encounter (Signed)
Returned her call and reviewed Holly [name]. She is feeling weak and light headed. She does not have much appetite.  Scheduled for IV fluids at 3 pm today. She is aware of appt time and appreciated the call back.

## 2022-07-08 NOTE — Progress Notes (Unsigned)
Patient Hgb 9.2 today no blood transfusion needed. Copy of labs given to patient.

## 2022-07-09 ENCOUNTER — Encounter: Payer: Self-pay | Admitting: Hematology and Oncology

## 2022-07-09 ENCOUNTER — Inpatient Hospital Stay: Payer: Medicare Other

## 2022-07-09 VITALS — BP 138/63 | HR 62 | Temp 97.7°F | Resp 18

## 2022-07-09 DIAGNOSIS — Z5111 Encounter for antineoplastic chemotherapy: Secondary | ICD-10-CM | POA: Diagnosis not present

## 2022-07-09 DIAGNOSIS — Z7189 Other specified counseling: Secondary | ICD-10-CM

## 2022-07-09 DIAGNOSIS — C5701 Malignant neoplasm of right fallopian tube: Secondary | ICD-10-CM

## 2022-07-09 MED ORDER — SODIUM CHLORIDE 0.9% FLUSH
10.0000 mL | Freq: Once | INTRAVENOUS | Status: AC | PRN
  Administered 2022-07-09: 10 mL

## 2022-07-09 MED ORDER — SODIUM CHLORIDE 0.9 % IV SOLN: Freq: Once | INTRAVENOUS | Status: AC

## 2022-07-09 MED ORDER — HEPARIN SOD (PORK) LOCK FLUSH 100 UNIT/ML IV SOLN
500.0000 [IU] | Freq: Once | INTRAVENOUS | Status: AC | PRN
  Administered 2022-07-09: 500 [IU]

## 2022-07-10 ENCOUNTER — Other Ambulatory Visit: Payer: Self-pay

## 2022-07-10 ENCOUNTER — Telehealth: Payer: Self-pay

## 2022-07-10 MED ORDER — MAGIC MOUTHWASH W/LIDOCAINE: 5.0000 mL | Freq: Four times a day (QID) | ORAL | 1 refills | Status: DC | PRN

## 2022-07-10 NOTE — Progress Notes (Signed)
Error

## 2022-07-10 NOTE — Telephone Encounter (Signed)
Called pt to make aware order was placed for magic mouthwash w/lidocaine per MD. She verbalized thanks and understanding.

## 2022-07-11 ENCOUNTER — Inpatient Hospital Stay: Payer: Medicare Other

## 2022-07-11 VITALS — BP 175/82 | HR 74 | Temp 98.2°F | Resp 16

## 2022-07-11 DIAGNOSIS — C5701 Malignant neoplasm of right fallopian tube: Secondary | ICD-10-CM

## 2022-07-11 DIAGNOSIS — Z7189 Other specified counseling: Secondary | ICD-10-CM

## 2022-07-11 DIAGNOSIS — Z5111 Encounter for antineoplastic chemotherapy: Secondary | ICD-10-CM | POA: Diagnosis not present

## 2022-07-11 MED ORDER — HEPARIN SOD (PORK) LOCK FLUSH 100 UNIT/ML IV SOLN
500.0000 [IU] | Freq: Once | INTRAVENOUS | Status: AC | PRN
  Administered 2022-07-11: 500 [IU]

## 2022-07-11 MED ORDER — SODIUM CHLORIDE 0.9% FLUSH
10.0000 mL | Freq: Once | INTRAVENOUS | Status: AC | PRN
  Administered 2022-07-11: 10 mL

## 2022-07-11 MED ORDER — SODIUM CHLORIDE 0.9 % IV SOLN: Freq: Once | INTRAVENOUS | Status: AC

## 2022-07-11 NOTE — Patient Instructions (Signed)

## 2022-07-15 ENCOUNTER — Encounter: Payer: Self-pay | Admitting: Hematology and Oncology

## 2022-07-15 ENCOUNTER — Telehealth: Payer: Self-pay | Admitting: *Deleted

## 2022-07-15 NOTE — Telephone Encounter (Signed)
Contacted patient in response to MyChart message she sent earlier asking MD thoughts on pause/change in upcoming scheduled treatment date. Advised her that message was forwarded to Dr. Alvy Bimler but that she was out of the office this week and may not see message until she returns. Patient stated she understood and appreciated phone call to inform her.

## 2022-07-16 ENCOUNTER — Telehealth: Payer: Self-pay | Admitting: Oncology

## 2022-07-16 NOTE — Telephone Encounter (Addendum)
Called Holly [name] and advised her that Dr. Alvy Bimler has reviewed her MyChart message. Advised that she needs to decide for herself about continuing or delaying treatment.  Holly [name] said she needs a few days to think about it and will call back with her decision.

## 2022-07-17 ENCOUNTER — Telehealth: Payer: Self-pay

## 2022-07-17 NOTE — Telephone Encounter (Signed)
Pt called and states she would like to r/s her 4/2 appt. Advised pt I would send a message to scheduling. She verbalized thanks and understanding.

## 2022-07-19 ENCOUNTER — Encounter: Payer: Self-pay | Admitting: Hematology and Oncology

## 2022-07-19 ENCOUNTER — Other Ambulatory Visit: Payer: Self-pay

## 2022-07-19 ENCOUNTER — Telehealth: Payer: Self-pay

## 2022-07-19 MED ORDER — FLUCONAZOLE 100 MG PO TABS: 100.0000 mg | ORAL_TABLET | Freq: Every day | ORAL | 0 refills | Status: DC

## 2022-07-19 NOTE — Telephone Encounter (Signed)
Called her regarding mychart message. She is still using magic mouth wash and she has thrush. Sent Diflucan Rx to [facility] her preferred pharmacy. She will call the office back for worsening symptoms.

## 2022-07-20 ENCOUNTER — Other Ambulatory Visit: Payer: Self-pay | Admitting: Hematology and Oncology

## 2022-07-21 ENCOUNTER — Other Ambulatory Visit: Payer: Self-pay | Admitting: Hematology and Oncology

## 2022-07-22 ENCOUNTER — Encounter: Payer: Self-pay | Admitting: Hematology and Oncology

## 2022-07-23 ENCOUNTER — Inpatient Hospital Stay: Payer: Medicare Other | Admitting: Dietician

## 2022-07-23 ENCOUNTER — Inpatient Hospital Stay: Payer: Medicare Other | Admitting: Hematology and Oncology

## 2022-07-23 ENCOUNTER — Inpatient Hospital Stay: Payer: Medicare Other

## 2022-07-23 ENCOUNTER — Other Ambulatory Visit: Payer: Self-pay

## 2022-07-31 MED FILL — Dexamethasone Sodium Phosphate Inj 100 MG/10ML: INTRAMUSCULAR | Qty: 1 | Status: AC

## 2022-07-31 MED FILL — Fosaprepitant Dimeglumine For IV Infusion 150 MG (Base Eq): INTRAVENOUS | Qty: 5 | Status: AC

## 2022-08-01 ENCOUNTER — Encounter: Payer: Self-pay | Admitting: Hematology and Oncology

## 2022-08-01 ENCOUNTER — Inpatient Hospital Stay: Payer: Medicare Other | Attending: Hematology and Oncology

## 2022-08-01 ENCOUNTER — Inpatient Hospital Stay: Payer: Medicare Other

## 2022-08-01 ENCOUNTER — Inpatient Hospital Stay (HOSPITAL_BASED_OUTPATIENT_CLINIC_OR_DEPARTMENT_OTHER): Payer: Medicare Other | Admitting: Hematology and Oncology

## 2022-08-01 VITALS — BP 150/51 | HR 68 | Temp 97.9°F | Resp 18 | Ht 65.0 in | Wt 139.4 lb

## 2022-08-01 VITALS — BP 179/71 | HR 59 | Temp 97.8°F | Resp 18

## 2022-08-01 DIAGNOSIS — C5701 Malignant neoplasm of right fallopian tube: Secondary | ICD-10-CM

## 2022-08-01 DIAGNOSIS — C7989 Secondary malignant neoplasm of other specified sites: Secondary | ICD-10-CM | POA: Diagnosis not present

## 2022-08-01 DIAGNOSIS — I10 Essential (primary) hypertension: Secondary | ICD-10-CM

## 2022-08-01 DIAGNOSIS — D61818 Other pancytopenia: Secondary | ICD-10-CM | POA: Diagnosis not present

## 2022-08-01 DIAGNOSIS — Z7189 Other specified counseling: Secondary | ICD-10-CM

## 2022-08-01 DIAGNOSIS — Z5111 Encounter for antineoplastic chemotherapy: Secondary | ICD-10-CM | POA: Diagnosis not present

## 2022-08-01 DIAGNOSIS — Z5189 Encounter for other specified aftercare: Secondary | ICD-10-CM | POA: Insufficient documentation

## 2022-08-01 DIAGNOSIS — K1231 Oral mucositis (ulcerative) due to antineoplastic therapy: Secondary | ICD-10-CM

## 2022-08-01 LAB — CMP (CANCER CENTER ONLY)
ALT: 5 U/L (ref 0–44)
AST: 13 U/L — ABNORMAL LOW (ref 15–41)
Albumin: 3.4 g/dL — ABNORMAL LOW (ref 3.5–5.0)
Alkaline Phosphatase: 73 U/L (ref 38–126)
Anion gap: 8 (ref 5–15)
BUN: 12 mg/dL (ref 8–23)
CO2: 25 mmol/L (ref 22–32)
Calcium: 9 mg/dL (ref 8.9–10.3)
Chloride: 100 mmol/L (ref 98–111)
Creatinine: 1.11 mg/dL — ABNORMAL HIGH (ref 0.44–1.00)
GFR, Estimated: 54 mL/min — ABNORMAL LOW (ref >60)
Glucose, Bld: 236 mg/dL — ABNORMAL HIGH (ref 70–99)
Potassium: 4.8 mmol/L (ref 3.5–5.1)
Sodium: 133 mmol/L — ABNORMAL LOW (ref 135–145)
Total Bilirubin: 0.5 mg/dL (ref 0.3–1.2)
Total Protein: 6.3 g/dL — ABNORMAL LOW (ref 6.5–8.1)

## 2022-08-01 LAB — CBC WITH DIFFERENTIAL (CANCER CENTER ONLY)
Abs Immature Granulocytes: 0.03 10*3/uL (ref 0.00–0.07)
Basophils Absolute: 0 10*3/uL (ref 0.0–0.1)
Basophils Relative: 0 %
Eosinophils Absolute: 0 10*3/uL (ref 0.0–0.5)
Eosinophils Relative: 1 %
HCT: 28.3 % — ABNORMAL LOW (ref 36.0–46.0)
Hemoglobin: 9.1 g/dL — ABNORMAL LOW (ref 12.0–15.0)
Immature Granulocytes: 1 %
Lymphocytes Relative: 11 %
Lymphs Abs: 0.4 10*3/uL — ABNORMAL LOW (ref 0.7–4.0)
MCH: 33.5 pg (ref 26.0–34.0)
MCHC: 32.2 g/dL (ref 30.0–36.0)
MCV: 104 fL — ABNORMAL HIGH (ref 80.0–100.0)
Monocytes Absolute: 0 10*3/uL — ABNORMAL LOW (ref 0.1–1.0)
Monocytes Relative: 1 %
Neutro Abs: 3.5 10*3/uL (ref 1.7–7.7)
Neutrophils Relative %: 86 %
Platelet Count: 226 10*3/uL (ref 150–400)
RBC: 2.72 MIL/uL — ABNORMAL LOW (ref 3.87–5.11)
RDW: 16.8 % — ABNORMAL HIGH (ref 11.5–15.5)
Smear Review: NORMAL
WBC Count: 4 10*3/uL (ref 4.0–10.5)
nRBC: 0 % (ref 0.0–0.2)

## 2022-08-01 LAB — SAMPLE TO BLOOD BANK

## 2022-08-01 LAB — TOTAL PROTEIN, URINE DIPSTICK: Protein, ur: NEGATIVE mg/dL

## 2022-08-01 MED ORDER — SODIUM CHLORIDE 0.9 % IV SOLN: Freq: Once | INTRAVENOUS | Status: AC

## 2022-08-01 MED ORDER — CETIRIZINE HCL 10 MG/ML IV SOLN
10.0000 mg | Freq: Once | INTRAVENOUS | Status: AC
  Administered 2022-08-01: 10 mg via INTRAVENOUS
  Filled 2022-08-01: qty 1

## 2022-08-01 MED ORDER — SODIUM CHLORIDE 0.9 % IV SOLN
87.5000 mg/m2 | Freq: Once | INTRAVENOUS | Status: AC
  Administered 2022-08-01: 150 mg via INTRAVENOUS
  Filled 2022-08-01: qty 25

## 2022-08-01 MED ORDER — FAMOTIDINE IN NACL 20-0.9 MG/50ML-% IV SOLN
20.0000 mg | Freq: Once | INTRAVENOUS | Status: AC
  Administered 2022-08-01: 20 mg via INTRAVENOUS
  Filled 2022-08-01: qty 50

## 2022-08-01 MED ORDER — HEPARIN SOD (PORK) LOCK FLUSH 100 UNIT/ML IV SOLN
500.0000 [IU] | Freq: Once | INTRAVENOUS | Status: AC | PRN
  Administered 2022-08-01: 500 [IU]

## 2022-08-01 MED ORDER — SODIUM CHLORIDE 0.9 % IV SOLN
150.0000 mg | Freq: Once | INTRAVENOUS | Status: AC
  Administered 2022-08-01: 150 mg via INTRAVENOUS
  Filled 2022-08-01: qty 150

## 2022-08-01 MED ORDER — SODIUM CHLORIDE 0.9% FLUSH
10.0000 mL | Freq: Once | INTRAVENOUS | Status: AC
  Administered 2022-08-01: 10 mL

## 2022-08-01 MED ORDER — SODIUM CHLORIDE 0.9% FLUSH
10.0000 mL | INTRAVENOUS | Status: DC | PRN
  Administered 2022-08-01: 10 mL

## 2022-08-01 MED ORDER — SODIUM CHLORIDE 0.9 % IV SOLN
278.2500 mg | Freq: Once | INTRAVENOUS | Status: AC
  Administered 2022-08-01: 280 mg via INTRAVENOUS
  Filled 2022-08-01: qty 28

## 2022-08-01 MED ORDER — ATENOLOL 25 MG PO TABS: 25.0000 mg | ORAL_TABLET | Freq: Every day | ORAL | 2 refills | Status: DC

## 2022-08-01 MED ORDER — SODIUM CHLORIDE 0.9 % IV SOLN
10.0000 mg | Freq: Once | INTRAVENOUS | Status: AC
  Administered 2022-08-01: 10 mg via INTRAVENOUS
  Filled 2022-08-01: qty 10

## 2022-08-01 MED ORDER — PALONOSETRON HCL INJECTION 0.25 MG/5ML
0.2500 mg | Freq: Once | INTRAVENOUS | Status: AC
  Administered 2022-08-01: 0.25 mg via INTRAVENOUS
  Filled 2022-08-01: qty 5

## 2022-08-01 NOTE — Assessment & Plan Note (Signed)
Her anemia is now stable with recent dose adjustment She does not need blood transfusion support Will monitor closely

## 2022-08-01 NOTE — Assessment & Plan Note (Signed)
Her recent CT imaging show positive response to therapy However, she tolerated treatment very poorly with uncontrolled hypertension, mucositis, severe pancytopenia and others I plan to continue on reduced dose of chemotherapy Plan to omit bevacizumab for the next few cycles until her blood pressure is back to normal She needed fluid support recently.  She will come back next week for IV fluids for several days. I plan to repeat imaging study end of May for further follow-up

## 2022-08-01 NOTE — Patient Instructions (Signed)
[facility]  Discharge Instructions: Thank you for choosing [facility] to provide your oncology and hematology care.   If you have a lab appointment with the Cancer Center, please go directly to the Cancer Center and check in at the registration area.   Wear comfortable clothing and clothing appropriate for easy access to any Portacath or PICC line.   We strive to give you quality time with your provider. You may need to reschedule your appointment if you arrive late (15 or more minutes).  Arriving late affects you and other patients whose appointments are after yours.  Also, if you miss three or more appointments without notifying the office, you may be dismissed from the clinic at the provider's discretion.      For prescription refill requests, have your pharmacy contact our office and allow 72 hours for refills to be completed.    Today you received the following chemotherapy and/or immunotherapy agents - Taxol/Carboplatin      To help prevent nausea and vomiting after your treatment, we encourage you to take your nausea medication as directed.  BELOW ARE SYMPTOMS THAT SHOULD BE REPORTED IMMEDIATELY: *FEVER GREATER THAN 100.4 F (38 C) OR HIGHER *CHILLS OR SWEATING *NAUSEA AND VOMITING THAT IS NOT CONTROLLED WITH YOUR NAUSEA MEDICATION *UNUSUAL SHORTNESS OF BREATH *UNUSUAL BRUISING OR BLEEDING *URINARY PROBLEMS (pain or burning when urinating, or frequent urination) *BOWEL PROBLEMS (unusual diarrhea, constipation, pain near the anus) TENDERNESS IN MOUTH AND THROAT WITH OR WITHOUT PRESENCE OF ULCERS (sore throat, sores in mouth, or a toothache) UNUSUAL RASH, SWELLING OR PAIN  UNUSUAL VAGINAL DISCHARGE OR ITCHING   Items with * indicate a potential emergency and should be followed up as soon as possible or go to the Emergency Department if any problems should occur.  Please show the CHEMOTHERAPY ALERT CARD or IMMUNOTHERAPY ALERT CARD  at check-in to the Emergency Department and triage nurse.  Should you have questions after your visit or need to cancel or reschedule your appointment, please contact [facility]  Dept: [phone]  and follow the prompts.  Office hours are 8:00 a.m. to 4:30 p.m. Monday - Friday. Please note that voicemails left after 4:00 p.m. may not be returned until the following business day.  We are closed weekends and major holidays. You have access to a nurse at all times for urgent questions. Please call the main number to the clinic Dept: [phone] and follow the prompts.   For any non-urgent questions, you may also contact your provider using MyChart. We now offer e-Visits for anyone 18 and older to request care online for non-urgent symptoms. For details visit [email or web address].   Also download the MyChart app! Go to the app store, search "MyChart", open the app, select [facility], and log in with your MyChart username and password.  

## 2022-08-01 NOTE — Progress Notes (Signed)
[facility] OFFICE PROGRESS NOTE  Patient Care Team: Lupita Raider, MD as PCP - General (Family Medicine) Artis Delay, MD as Consulting Physician (Hematology and Oncology) Adolphus Birchwood, MD as Consulting Physician (Obstetrics and Gynecology)  ASSESSMENT & PLAN:  Adenocarcinoma of right fallopian tube [facility]) Her recent CT imaging show positive response to therapy However, she tolerated treatment very poorly with uncontrolled hypertension, mucositis, severe pancytopenia and others I plan to continue on reduced dose of chemotherapy Plan to omit bevacizumab for the next few cycles until her blood pressure is back to normal She needed fluid support recently.  She will come back next week for IV fluids for several days. I plan to repeat imaging study end of May for further follow-up  Essential hypertension Her documented blood pressure at home fluctuate widely but still quite high Will continue to hold bevacizumab She will continue her current prescribed antihypertensives  Pancytopenia, acquired (HCC) Her anemia is now stable with recent dose adjustment She does not need blood transfusion support Will monitor closely  Mucositis due to antineoplastic therapy She has intermittent debilitating mucositis It is improved She has lost a lot of weight I will adjust the dose of her treatment accordingly We discussed importance of frequent small meals and I strongly advised the patient to keep her weight up if possible  No orders of the defined types were placed in this encounter.   All questions were answered. The patient knows to call the clinic with any problems, questions or concerns. The total time spent in the appointment was 40 minutes encounter with patients including review of chart and various tests results, discussions about plan of care and coordination of care plan   Artis Delay, MD 08/01/2022 10:57 AM  INTERVAL HISTORY: Please see below for problem oriented  charting. she returns for treatment follow-up with her husband She has persistent intermittent mucositis She has lost some weight since last time I saw her She is eating better The patient denies any recent signs or symptoms of bleeding such as spontaneous epistaxis, hematuria or hematochezia. Denies recent nausea, vomiting, constipation or diarrhea I reviewed documented blood pressure from home Some of her systolic blood pressure is in the high 160s  REVIEW OF SYSTEMS:   Constitutional: Denies fevers, chills Eyes: Denies blurriness of vision Respiratory: Denies cough, dyspnea or wheezes Cardiovascular: Denies palpitation, chest discomfort or lower extremity swelling Gastrointestinal:  Denies nausea, heartburn or change in bowel habits Skin: Denies abnormal skin rashes Lymphatics: Denies new lymphadenopathy or easy bruising Neurological:Denies numbness, tingling or new weaknesses Behavioral/Psych: Mood is stable, no new changes  All other systems were reviewed with the patient and are negative.  I have reviewed the past medical history, past surgical history, social history and family history with the patient and they are unchanged from previous note.  ALLERGIES:  is allergic to dilaudid [hydromorphone hcl], morphine and related, and sudafed [pseudoephedrine hcl].  MEDICATIONS:  Current Outpatient Medications  Medication Sig Dispense Refill   ALAWAY 0.035 % ophthalmic solution Place 1 drop into both eyes 2 (two) times daily as needed (for irritation).     atenolol (TENORMIN) 25 MG tablet Take 1 tablet (25 mg total) by mouth daily. 30 tablet 2   augmented betamethasone dipropionate (DIPROLENE-AF) 0.05 % cream Apply 1 application  topically 2 (two) times daily as needed (to affected sites- for redness or swelling).     cloNIDine (CATAPRES) 0.1 MG tablet Take 1 tablet (0.1 mg total) by mouth daily. 30 tablet 1  dexamethasone (DECADRON) 4 MG tablet Take 2 tabs at the night before and 2  tab the morning of chemotherapy, every 3 weeks, by mouth x 6 cycles (Patient taking differently: Take 8 mg by mouth See admin instructions. Take 8 mg (2 tablets) by mouth the night before and morning of chemotherapy- every 3 weeks, for 6 cycles) 36 tablet 6   docusate sodium (COLACE) 100 MG capsule Take 100 mg by mouth daily as needed for mild constipation.     doxazosin (CARDURA) 4 MG tablet TAKE 1 TABLET BY MOUTH EVERY DAY 30 tablet 1   lidocaine-prilocaine (EMLA) cream Apply to affected area once daily as directed. (Patient taking differently: 1 application  daily as needed (as directed- to access port).) 30 g 3   lisinopril (ZESTRIL) 20 MG tablet Take 1 tablet (20 mg total) by mouth 2 (two) times daily. 60 tablet 1   magic mouthwash w/lidocaine SOLN Take 5 mLs by mouth 4 (four) times daily as needed for mouth pain. 240 mL 1   MIRALAX 17 GM/SCOOP powder Take 17 g by mouth daily as needed for mild constipation.     ondansetron (ZOFRAN) 8 MG tablet Take 1 tablet (8 mg total) by mouth every 8 (eight) hours as needed for nausea or vomiting. Start on the third day after chemotherapy. 30 tablet 1   oxyCODONE (OXY IR/ROXICODONE) 5 MG immediate release tablet Take 1 tablet by mouth every 4 hours as needed for severe pain. 60 tablet 0   prochlorperazine (COMPAZINE) 10 MG tablet Take 1 tablet (10 mg total) by mouth every 6 (six) hours as needed for nausea or vomiting. 30 tablet 1   sodium chloride (OCEAN) 0.65 % SOLN nasal spray Place 1 spray into both nostrils as needed for congestion.     triamcinolone cream (KENALOG) 0.1 % Apply 1 Application topically 2 (two) times daily as needed (to affected areas- for irritation).     TYLENOL 8 HOUR ARTHRITIS PAIN 650 MG CR tablet Take 650-1,300 mg by mouth every 8 (eight) hours as needed for pain.     venlafaxine XR (EFFEXOR-XR) 37.5 MG 24 hr capsule TAKE 1 CAPSULE BY MOUTH DAILY WITH BREAKFAST. (Patient taking differently: Take 37.5 mg by mouth daily with breakfast.)  90 capsule 3   No current facility-administered medications for this visit.   Facility-Administered Medications Ordered in Other Visits  Medication Dose Route Frequency Provider Last Rate Last Admin   CARBOplatin (PARAPLATIN) 280 mg in sodium chloride 0.9 % 100 mL chemo infusion  280 mg Intravenous Once Bertis Ruddy, Ruqayya Ventress, MD       heparin lock flush 100 unit/mL  500 Units Intracatheter Once PRN Bertis Ruddy, Maryn Freelove, MD       PACLitaxel (TAXOL) 150 mg in sodium chloride 0.9 % 250 mL chemo infusion (> 80mg /m2)  87.5 mg/m2 (Order-Specific) Intravenous Once Bertis Ruddy, Shemiah Rosch, MD       sodium chloride flush (NS) 0.9 % injection 10 mL  10 mL Intravenous PRN Bertis Ruddy, Dovber Ernest, MD   10 mL at 02/11/17 0839   sodium chloride flush (NS) 0.9 % injection 10 mL  10 mL Intravenous PRN Bertis Ruddy, Omah Dewalt, MD   10 mL at 03/04/17 1510   sodium chloride flush (NS) 0.9 % injection 10 mL  10 mL Intracatheter PRN Artis Delay, MD        SUMMARY OF ONCOLOGIC HISTORY: Oncology History Overview Note  Neg genetics High grade serous    Adenocarcinoma of right fallopian tube  08/12/2016 Initial Diagnosis   The patient  has many months of vague symptoms of fullness in the pelvis, urinary frequency and difficulty with defecation. She denies vaginal bleeding or rectal bleeding.     08/12/2016 Imaging   Abdomen X-ray in the ER: Moderate colonic stool burden without evidence of enteric obstruction   11/13/2016 Imaging   TVUS was performed on 11/13/16 which showed a large hypoechoic lobular mass seen posterior to the uterus measuring 18.2x16.1x11.8cm, blood flow was seen along the periphery of the mass. It was considered to be likely to be a fibroid vs pelvic mass vs ovarian mass. Neither ovary was removed. Moderate amount of free fluid in the pelvis. THe uterus measures 4x10x4cm. The endometrium is 35mm.    12/05/2016 Imaging   MR pelvis 1. Mild limitations as detailed above. 2. Heterogeneous pelvic mass is favored to arise from the right ovary. Favor a  solid ovarian neoplasm such as fibroma/Brenner's tumor. Given lesion size and heterogeneous T2 signal out, complicating torsion cannot be excluded. 3. Moderate abdominopelvic ascites, without specific evidence of peritoneal metastasis. Consider further evaluation with contrast-enhanced abdominopelvic CT.    12/26/2016 Pathology Results   1. Uterus, ovaries and fallopian tubes - ADENOCARCINOMA INVOLVING RIGHT FALLOPIAN TUBE, RIGHT AND LEFT OVARIES, UTERINE SEROSA AND PELVIC MASS. - SEE ONCOLOGY TABLE AND COMMENT. - UTERINE CERVIX, ENDOMETRIUM, MYOMETRIUM AND LEFT FALLOPIAN TUBE FREE OF TUMOR. 2. Omentum, resection for tumor - ADENOCARCINOMA. Microscopic Comment 1. ONCOLOGY TABLE - FALLOPIAN TUBE. SEE COMMENT 1. Specimen, including laterality: Uterus, bilateral adnexa, pelvic mass and omentum. 2. Procedure: Hysterectomy with bilateral salpingo-oophorectomy, pelvic mass excision and omentectomy. 3. Lymph node sampling performed: No 4. Tumor site: See comment. 5. Tumor location in fallopian tube: Right fallopian tube fimbria 6. Specimen integrity (intact/ruptured/disrupted): Intact 7. Tumor size (cm): 18 cm, see comment. 8. Histologic type: Adenocarcinoma 9. Grade: High grade 10. Microscopic tumor extension: Tumor involves right fallopian tube, right and left ovaries, uterine serosa, pelvic mass and omentum. 11. Margins: See comment. 12. Lymph-Vascular invasion: Present 13. Lymph nodes: # examined: 0; # positive: N/A 14. TNM: pT3c, pNX 15. FIGO Stage (based on pathologic findings, needs clinical correlation: III-C 16. Comments: There is an 18 cm pelvic mass which is a high grade adenocarcinoma and there is a 12.2 cm  segment of omentum which is extensively involved with adenocarcinoma. The tumor also involves the fimbria of the right fallopian tube and the parenchyma of the right and left ovaries as well as uterine serosa. The fimbria of the right fallopian has intraepithelial atypia  consistent with a precursor lesion and therefore this is most consistent with primary fallopian tube adenocarcinoma. A primary peritoneal serous carcinoma is a less likely possibility.    12/26/2016 Pathology Results   PERITONEAL/ASCITIC FLUID(SPECIMEN 1 OF 1 COLLECTED 12/26/16): POORLY DIFFERENTIATED ADENOCARCINOMA   12/26/2016 Surgery   Procedure(s) Performed: Exploratory laparotomy with total abdominal hysterectomy, bilateral salpingo-oophorectomy, omentectomy radical tumor debulking for ovarian cancer .   Surgeon: Luisa Dago, MD.      Operative Findings: 20cm left ovarian mass densely adherent to the posterior uterus, right tube and ovary, cervix, sigmoid colon. 10cm omental cake. 4L ascites. 47mm size tumor nodules on the serosa of the terminal ileum and proximal sigmoid colon. 14mm nodules on right diaphragm.    This represented an optimal cytoreduction (R1) with 46mm nodules on intestine and diaphragm representing gross visible disease   01/16/2017 Procedure   Placement of single lumen port a cath via right internal jugular vein. The catheter tip lies at the cavo-atrial junction. A  power injectable port a cath was placed and is ready for immediate use.   01/17/2017 Imaging   1. 3.5 x 7.2 x 4.6 cm fluid collection along the vaginal cuff, posterior the bladder and extending into the right adnexal space. This lesion demonstrates rim enhancement. Imaging features could be related to a loculated postoperative seroma or hematoma. Superinfection cannot be excluded by CT. Given debulking surgery was 3 weeks ago, this entire structure is un likely to represent neoplasm, but peritoneal involvement could have this appearance. 2. Small fluid collection in the left para colic gutter without rim enhancement. 3. Irregular/nodular appearance of the peritoneal M in the anatomic pelvis with areas of subtle nodularity between the stomach and the spleen. Close attention in these regions on follow-up recommended as  metastatic disease is a concern. 4. Mildly enlarged hepatoduodenal ligament lymph node. Attention on follow-up recommended.   01/20/2017 Tumor Marker   Patient's tumor was tested for the following markers: CA-125 Results of the tumor marker test revealed 46.6   01/28/2017 Genetic Testing       Negative genetic testing on the [facility].  The [facility] gene panel offered by [facility] includes sequencing and deletion/duplication testing of the following 28 genes: APC, ATM, BARD1, BMPR1A, BRCA1, BRCA2, BRIP1, CHD1, CDK4, CDKN2A, CHEK2, EPCAM (large rearrangement only), MLH1, MSH2, MSH6, MUTYH, NBN, PALB2, PMS2, PTEN, RAD51C, RAD51D, SMAD4, STK11, and TP53. Sequencing was performed for select regions of POLE and POLD1, and large rearrangement analysis was performed for select regions of GREM1. The report date is January 28, 2017.  HRD testing looking for genomic instability and BRCA mutations was negative.  The report date of this test is January 27, 2017.    01/31/2017 Tumor Marker   Patient's tumor was tested for the following markers: CA-125 Results of the tumor marker test revealed 22.4   03/04/2017 Tumor Marker   Patient's tumor was tested for the following markers: CA-125 Results of the tumor marker test revealed 13.8   04/18/2017 Tumor Marker   Patient's tumor was tested for the following markers: CA-125 Results of the tumor marker test revealed 11.9   05/05/2017 Tumor Marker   Patient's tumor was tested for the following markers: CA-125 Results of the tumor marker test revealed 12   05/26/2017 Tumor Marker   Patient's tumor was tested for the following markers: CA-125 Results of the tumor marker test revealed 10.6   05/26/2017 Imaging   1. A previously noted postoperative fluid collection in the low pelvis and residual ascites seen on the prior examination has resolved on today's study. No findings to suggest residual/recurrent disease on today's examination. No  definite solid organ metastasis identified in the abdomen or pelvis. No lymphadenopathy. 2. Aortic atherosclerosis.   06/10/2017 Procedure   Successful right IJ vein Port-A-Cath explant.   03/09/2018 Tumor Marker   Patient's tumor was tested for the following markers: CA-125 Results of the tumor marker test revealed 12.1   05/26/2018 Tumor Marker   Patient's tumor was tested for the following markers: CA-125 Results of the tumor marker test revealed 13.8   09/09/2018 Tumor Marker   Patient's tumor was tested for the following markers: CA-125 Results of the tumor marker test revealed 23.9   12/18/2018 Tumor Marker   Patient's tumor was tested for the following markers: CA-125 Results of the tumor marker test revealed 43.8   02/27/2019 - 02/28/2019 Hospital Admission   She was admitted for bowel obstruction   02/27/2019 Imaging   1. High-grade  small bowel obstruction with transition point in the pelvis in an area of suspected desmoplastic reaction surrounding a 1.3 cm spiculated nodule in the mesentery, suspicious for carcinoid. There is hyperenhancement near the tip of the appendix and loss of the normal fat plane between it and the adjacent sigmoid colon, potentially the site of primary lesion. 2. Multiple new small subcentimeter hyperdense lesions scattered along the liver capsule, concerning for metastases. Enlarged hyperenhancing gastrohepatic and portacaval lymph nodes concerning for nodal metastases. 3. Very mild right hydroureteronephrosis to the level of the desmoplastic reaction in the pelvis, potentially involving the right ureter. 4. Infraumbilical ventral abdominal wall diastasis containing a small nondilated portion of transverse colon. 5. Trace ascites. 6. Cholelithiasis. 7.  Aortic atherosclerosis (ICD10-I70.0).     03/05/2019 Tumor Marker   Patient's tumor was tested for the following markers: CA-125 Results of the tumor marker test revealed 70.1   03/12/2019  Echocardiogram   IMPRESSIONS     1. Left ventricular ejection fraction, by visual estimation, is 60 to 65%. The left ventricle has normal function. There is no left ventricular hypertrophy.  2. Left ventricular diastolic parameters are consistent with Grade I diastolic dysfunction (impaired relaxation).  3. Global right ventricle has normal systolic function.The right ventricular size is normal. No increase in right ventricular wall thickness.  4. Left atrial size was normal.  5. Right atrial size was normal.  6. The pericardium was not assessed.  7. The mitral valve is normal in structure. No evidence of mitral valve regurgitation.  8. The tricuspid valve is normal in structure. Tricuspid valve regurgitation is trivial.  9. The aortic valve is normal in structure. Aortic valve regurgitation is not visualized. 10. The pulmonic valve was grossly normal. Pulmonic valve regurgitation is not visualized. 11. The average left ventricular global longitudinal strain is -17.6 %.   03/16/2019 Procedure   Successful placement of a right internal jugular approach power injectable Port-A-Cath. The catheter is ready for immediate use.     04/19/2019 Tumor Marker   Patient's tumor was tested for the following markers: CA-125 Results of the tumor marker test revealed 39.8   05/17/2019 Tumor Marker   Patient's tumor was tested for the following markers: CA-125 Results of the tumor marker test revealed 27   05/28/2019 Tumor Marker   Patient's tumor was tested for the following markers: CA-125 Results of the tumor marker test revealed 27.7.   06/11/2019 Imaging   1. No new or progressive metastatic disease in the abdomen or pelvis. 2. Tiny noncalcified perisplenic implant is decreased. Calcified pericardiophrenic, retroperitoneal and bilateral inguinal lymph nodes and scattered small calcified perihepatic and perisplenic implants are stable. 3.  Aortic Atherosclerosis (ICD10-I70.0).       06/17/2019  Echocardiogram    1. Left ventricular ejection fraction, by estimation, is 65 to 70%. The left ventricle has normal function. The left ventricle has no regional wall motion abnormalities. Left ventricular diastolic parameters were normal.  2. Right ventricular systolic function is normal. The right ventricular size is normal.  3. The mitral valve is normal in structure and function. No evidence of mitral valve regurgitation. No evidence of mitral stenosis.  4. The aortic valve is normal in structure and function. Aortic valve regurgitation is not visualized. No aortic stenosis is present.   06/21/2019 Tumor Marker   Patient's tumor was tested for the following markers: CA-125 Results of the tumor marker test revealed 18.7   07/19/2019 Tumor Marker   Patient's tumor was tested for the following  markers: CA-125 Results of the tumor marker test revealed 16.7   08/30/2019 Tumor Marker   Patient's tumor was tested for the following markers: CA-125. Results of the tumor marker test revealed 13.9   09/15/2019 Echocardiogram    1. Left ventricular ejection fraction, by estimation, is 65 to 70%. The left ventricle has normal function. The left ventricle has no regional wall motion abnormalities. Left ventricular diastolic parameters are consistent with Grade I diastolic dysfunction (impaired relaxation). The average left ventricular global longitudinal strain is 18.1 %. The global longitudinal strain is normal.  2. Right ventricular systolic function is normal. The right ventricular size is normal.  3. The mitral valve is normal in structure. No evidence of mitral valve regurgitation. No evidence of mitral stenosis.  4. The aortic valve is normal in structure. Aortic valve regurgitation is not visualized. No aortic stenosis is present.  5. The inferior vena cava is normal in size with greater than 50% respiratory variability, suggesting right atrial pressure of 3 mmHg.     09/24/2019 Imaging   1. Stable  exam. No new or progressive metastatic disease on today's study. 2. Small subcapsular hypodensity in the lateral spleen, new in the interval, but indeterminate. Attention on follow-up recommended. 3. The calcified upper abdominal and groin lymph nodes are stable as are the scattered calcified and noncalcified peritoneal implants. 4. Stable midline ventral hernia containing a short segment of colon without complicating features. 5. Aortic Atherosclerosis (ICD10-I70.0).     10/04/2019 - 09/21/2021 Chemotherapy   Patient is on Treatment Plan : ovarian Bevacizumab q21d     10/04/2019 Tumor Marker   Patient's tumor was tested for the following markers: CA-125. Results of the tumor marker test revealed 12.9.   11/01/2019 Tumor Marker   Patient's tumor was tested for the following markers: CA-125 Results of the tumor marker test revealed 15.4   12/13/2019 Tumor Marker   Patient's tumor was tested for the following markers: CA-125 Results of the tumor marker test revealed 14.8   12/31/2019 Imaging   1. Unchanged tiny peritoneal nodule adjacent to the spleen measuring 6 mm. Other previously noted peritoneal nodules are imperceptible on present examination. 2. Unchanged prominent, partially calcified portacaval lymph node and bilateral inguinal lymph nodes. 3. Unchanged subtle, nonspecific subcapsular lesion of the spleen.  4. No evidence of new metastatic disease in the abdomen or pelvis. 5. Status post hysterectomy. 6. Unchanged low midline ventral abdominal hernia containing a single loop of nonobstructed transverse colon. 7. Cholelithiasis. 8. Aortic Atherosclerosis (ICD10-I70.0).       01/03/2020 Tumor Marker   Patient's tumor was tested for the following markers: CA-125 Results of the tumor marker test revealed 11.7.   01/24/2020 Tumor Marker   Patient's tumor was tested for the following markers: CA-125. Results of the tumor marker test revealed 15.1   03/06/2020 Tumor Marker    Patient's tumor was tested for the following markers: CA-125. Results of the tumor marker test revealed 20.3   03/27/2020 Tumor Marker   Patient's tumor was tested for the following markers: CA-125 Results of the tumor marker test revealed 23.7   05/11/2020 Tumor Marker   Patient's tumor was tested for the following markers: CA-125. Results of the tumor marker test revealed 25.4   06/23/2020 Imaging   1. Cholelithiasis with gallbladder wall thickening and mild infiltrative edema in the porta hepatis, raising the possibility of acute cholecystitis. There is truncation of the common bile duct distally and distal choledocholithiasis is difficult to exclude. Correlate with  bilirubin levels. If clinically warranted, MRCP could be utilized for further characterization. 2. Stable tiny perisplenic nodule. 3. Other imaging findings of potential clinical significance: Small type 1 hiatal hernia. Prominent stool throughout the colon favors constipation. Ventral infraumbilical hernia contains transverse colon without findings of strangulation or obstruction. Small supraumbilical hernia contains adipose tissue. Lumbar degenerative disc disease at L5-S1. Stable tiny nodule along the lateral margin of the spleen, nonspecific. 4. Aortic atherosclerosis.     12/15/2020 Imaging   No evidence of recurrent or metastatic carcinoma within the abdomen or pelvis.   Stable ventral hernia containing transverse colon. No evidence of bowel obstruction or strangulation.   Cholelithiasis. No radiographic evidence of cholecystitis.   Tiny hiatal hernia.   Aortic Atherosclerosis (ICD10-I70.0).     06/11/2021 Imaging   1. Numerous sub 4 mm pulmonary nodules in the lungs some of which have a cavitary appearance. I think it is unlikely this is metastatic disease and more likely a possible immunotherapy related process such as a sarcoid like reaction. Recommend full chest CT further evaluation. 2. Stable small calcified  retroperitoneal and mesenteric lymph nodes. No findings suspicious for recurrent peritoneal carcinomatosis. 3. Stable lower abdominal wall hernia containing part of the transverse colon. 4. Cholelithiasis.   09/27/2021 Imaging   1. Numerous small pulmonary nodules throughout the lungs, the majority of which are cavitary. These are all subcentimeter, however nodules that were seen in the bilateral lung bases on prior CT dated 06/08/2021 appear slightly increased in size and number. Behavior is highly suspicious for pulmonary metastatic disease despite the unusual appearance, however as previously reported general differential considerations include atypical infection and inflammation, such as drug-induced sarcoidosis like reaction.  2. Unchanged calcified lymph nodes in the abdomen and pelvis, consistent with treated nodal metastases. 3. Cholelithiasis with wall thickening and pericholecystic fluid. This appearance is similar to prior examination and suggests chronic cholecystitis. Correlate for clinical symptoms. Mild intrahepatic biliary ductal dilatation, similar to prior examination. 4. Midline ventral hernia containing a single nonobstructed loop of mid transverse colon.     12/25/2021 Imaging   1. Again noted are multiple thin walled cavitary lung nodules throughout both lungs. These are similar in size and multiplicity when compared with the exam from 09/26/2021. Etiology remains indeterminate, although metastatic disease cannot be excluded with a high degree of certainty. 2. Stable calcified upper abdominal lymph nodes consistent with treated nodal metastases. 3. Gallstones. 4. Aortic Atherosclerosis (ICD10-I70.0).   01/30/2022 Tumor Marker   Patient's tumor was tested for the following markers: CA-125. Results of the tumor marker test revealed 59.3.   01/31/2022 Imaging   1. No substantial interval change in exam. 2. Innumerable tiny cavitary pulmonary nodules in the lung bases. As noted  previously, metastatic disease would be distinct consideration although imaging features are nonspecific and infectious/inflammatory etiology is not excluded. 3. Calcified nodal disease in the upper abdomen and pelvis shows no substantial interval change. Scattered noncalcified nodules in the mesentery are similar to prior. 4. Markedly large stool volume throughout the colon. Imaging features suggestive of clinical constipation. 5. Cholelithiasis with ill-defined appearance of the gallbladder wall. Appearance is stable in the interval. 6. Trace free fluid in the cul-de-sac. 7. Midline ventral hernia contains a short segment of transverse colon without complicating features. 8. Aortic Atherosclerosis (ICD10-I70.0).   03/07/2022 Surgery   Procedure Date:  03/07/2022  Pre-operative Diagnosis:  Symptomatic cholelithiasis   Post-operative Diagnosis: Symptomatic cholelithiasis, peritoneal implants with history of right fallopian tube cancer.   Procedure:  Robotic assisted cholecystectomy with ICG FireFly cholangiogram Robotic assisted abdominal wall peritoneal implant excisional biopsy x 3.   Surgeon:  Howie Ill, MD  Specimens:   Gallbladder Peritoneal implants x 3    Indications for Procedure:  This is a 67 y.o. female who presents with abdominal pain and workup revealing symptomatic cholelithiasis.  The benefits, complications, treatment options, and expected outcomes were discussed with the patient. The risks of bleeding, infection, recurrence of symptoms, failure to resolve symptoms, bile duct damage, bile duct leak, retained common bile duct stone, bowel injury, and need for further procedures were all discussed with the patient and she was willing to proceed.     03/07/2022 Pathology Results   SURGICAL PATHOLOGY  CASE: [id number]  PATIENT: Edwin Cap  Surgical Pathology Report   Specimen Submitted:  A. Gallbladder  B. Abdominal wall   Clinical History: Symptomatic  cholelithiasis   DIAGNOSIS:  A. GALLBLADDER; CHOLECYSTECTOMY:  - CHRONIC CHOLECYSTITIS WITH CHOLELITHIASIS.  - NEGATIVE FOR DYSPLASIA AND MALIGNANCY.  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   B. SOFT TISSUE, ABDOMINAL WALL; BIOPSY:  - POSITIVE FOR MALIGNANCY.  - METASTATIC CARCINOMA, COMPATIBLE WITH HIGH-GRADE SEROUS CARCINOMA OF GYNECOLOGIC PRIMARY.   Comment:  Immunohistochemical studies were performed on neoplastic cells in both sources. Tumor cells in both are positive for CK7, Pax-8, and WT-1, supporting the above diagnosis. P53 demonstrates strong and diffuse expression, indicative of mutated expression pattern.   There is sufficient tissue present for ancillary molecular testing if desired.    04/04/2022 Imaging   1. Mild progression of partially calcified peritoneal metastatic implants surrounding the liver, in the porta hepatis and in the periumbilical region. No generalized ascites or peritoneal nodularity status post omentectomy. 2. Grossly stable cavitary lesions at both lung bases, presumably reflecting metastatic disease. 3. No other evidence of progressive metastatic disease within the abdomen or pelvis. 4. Enlarging ventral hernia containing a portion of the transverse colon. No evidence of bowel obstruction or inflammation. 5.  Aortic Atherosclerosis (ICD10-I70.0).   04/08/2022 -  Chemotherapy   Patient is on Treatment Plan : OVARIAN Carboplatin + Paclitaxel + Bevacizumab q21d      04/09/2022 Tumor Marker   Patient's tumor was tested for the following markers: CA-125. Results of the tumor marker test revealed 86.7.   05/22/2022 Tumor Marker   Patient's tumor was tested for the following markers: CA-125. Results of the tumor marker test revealed 49.7.   06/05/2022 Imaging   1. New tiny bilateral pleural effusions. 2. Once again there are several small cavitary lesions in the lungs. Many  of these are becoming more cavitary with less soft tissue elements and are smaller today. 3. Soft tissue thickening and nodularity along the surface of the liver appears to be overall decreasing from previous examination. Some of these areas have calcification. 4. Mesenteric nodules as well as retroperitoneal and mesenteric nodes appear similar to slightly improved. The inguinal nodes seen previously are similar. 5. No developing new mass lesion, ascites or lymph node enlargement seen. 6. Of interest the density of the lumen of the renal pelvis bilaterally is increased on the left compared to the right in the portal venous phase. This is of uncertain etiology and significance. Please correlate with the urinalysis such as for hematuria.     PHYSICAL EXAMINATION: ECOG PERFORMANCE STATUS: 1 - Symptomatic but completely ambulatory  Vitals:   08/01/22 0836  BP: (!) 150/51  Pulse: 68  Resp:  18  Temp: 97.9 F (36.6 C)  SpO2: 98%   Filed Weights   08/01/22 0836  Weight: 139 lb 6.4 oz (63.2 kg)    GENERAL:alert, no distress and comfortable  NEURO: alert & oriented x 3 with fluent speech, no focal motor/sensory deficits  LABORATORY DATA:  I have reviewed the data as listed    Component Value Date/Time   NA 133 (L) 08/01/2022 0757   NA 139 04/14/2017 0742   K 4.8 08/01/2022 0757   K 4.0 04/14/2017 0742   CL 100 08/01/2022 0757   CO2 25 08/01/2022 0757   CO2 21 (L) 04/14/2017 0742   GLUCOSE 236 (H) 08/01/2022 0757   GLUCOSE 247 (H) 04/14/2017 0742   BUN 12 08/01/2022 0757   BUN 13.1 04/14/2017 0742   CREATININE 1.11 (H) 08/01/2022 0757   CREATININE 0.9 04/14/2017 0742   CALCIUM 9.0 08/01/2022 0757   CALCIUM 9.2 04/14/2017 0742   PROT 6.3 (L) 08/01/2022 0757   PROT 7.3 04/14/2017 0742   ALBUMIN 3.4 (L) 08/01/2022 0757   ALBUMIN 3.9 04/14/2017 0742   AST 13 (L) 08/01/2022 0757   AST 17 04/14/2017 0742   ALT 5 08/01/2022 0757   ALT 14 04/14/2017 0742   ALKPHOS 73 08/01/2022  0757   ALKPHOS 70 04/14/2017 0742   BILITOT 0.5 08/01/2022 0757   BILITOT 0.37 04/14/2017 0742   GFRNONAA 54 (L) 08/01/2022 0757   GFRAA >60 01/24/2020 0924   GFRAA >60 07/19/2019 0951    No results found for: "SPEP", "UPEP"  Lab Results  Component Value Date   WBC 4.0 08/01/2022   NEUTROABS 3.5 08/01/2022   HGB 9.1 (L) 08/01/2022   HCT 28.3 (L) 08/01/2022   MCV 104.0 (H) 08/01/2022   PLT 226 08/01/2022      Chemistry      Component Value Date/Time   NA 133 (L) 08/01/2022 0757   NA 139 04/14/2017 0742   K 4.8 08/01/2022 0757   K 4.0 04/14/2017 0742   CL 100 08/01/2022 0757   CO2 25 08/01/2022 0757   CO2 21 (L) 04/14/2017 0742   BUN 12 08/01/2022 0757   BUN 13.1 04/14/2017 0742   CREATININE 1.11 (H) 08/01/2022 0757   CREATININE 0.9 04/14/2017 0742      Component Value Date/Time   CALCIUM 9.0 08/01/2022 0757   CALCIUM 9.2 04/14/2017 0742   ALKPHOS 73 08/01/2022 0757   ALKPHOS 70 04/14/2017 0742   AST 13 (L) 08/01/2022 0757   AST 17 04/14/2017 0742   ALT 5 08/01/2022 0757   ALT 14 04/14/2017 0742   BILITOT 0.5 08/01/2022 0757   BILITOT 0.37 04/14/2017 0742

## 2022-08-01 NOTE — Assessment & Plan Note (Signed)
Her documented blood pressure at home fluctuate widely but still quite high Will continue to hold bevacizumab She will continue her current prescribed antihypertensives

## 2022-08-01 NOTE — Assessment & Plan Note (Signed)
She has intermittent debilitating mucositis It is improved She has lost a lot of weight I will adjust the dose of her treatment accordingly We discussed importance of frequent small meals and I strongly advised the patient to keep her weight up if possible

## 2022-08-02 ENCOUNTER — Other Ambulatory Visit: Payer: Self-pay

## 2022-08-03 ENCOUNTER — Inpatient Hospital Stay: Payer: Medicare Other

## 2022-08-03 VITALS — BP 135/69 | HR 57 | Temp 97.7°F | Resp 18

## 2022-08-03 DIAGNOSIS — Z7189 Other specified counseling: Secondary | ICD-10-CM

## 2022-08-03 DIAGNOSIS — Z5111 Encounter for antineoplastic chemotherapy: Secondary | ICD-10-CM | POA: Diagnosis not present

## 2022-08-03 DIAGNOSIS — C5701 Malignant neoplasm of right fallopian tube: Secondary | ICD-10-CM

## 2022-08-03 MED ORDER — PEGFILGRASTIM-CBQV 6 MG/0.6ML ~~LOC~~ SOSY
6.0000 mg | PREFILLED_SYRINGE | Freq: Once | SUBCUTANEOUS | Status: AC
  Administered 2022-08-03: 6 mg via SUBCUTANEOUS

## 2022-08-06 ENCOUNTER — Encounter: Payer: Self-pay | Admitting: Hematology and Oncology

## 2022-08-07 ENCOUNTER — Inpatient Hospital Stay: Payer: Medicare Other

## 2022-08-07 ENCOUNTER — Other Ambulatory Visit (HOSPITAL_COMMUNITY): Payer: Self-pay

## 2022-08-07 ENCOUNTER — Encounter: Payer: Self-pay | Admitting: Hematology and Oncology

## 2022-08-07 VITALS — BP 168/64 | HR 74 | Temp 97.9°F | Resp 17

## 2022-08-07 DIAGNOSIS — C5701 Malignant neoplasm of right fallopian tube: Secondary | ICD-10-CM

## 2022-08-07 DIAGNOSIS — Z5111 Encounter for antineoplastic chemotherapy: Secondary | ICD-10-CM | POA: Diagnosis not present

## 2022-08-07 DIAGNOSIS — Z7189 Other specified counseling: Secondary | ICD-10-CM

## 2022-08-07 MED ORDER — LIDOCAINE VISCOUS HCL 2 % MT SOLN
OROMUCOSAL | 1 refills | Status: DC
  Filled 2022-08-07: qty 100, 1d supply, fill #0
  Filled 2022-08-29: qty 100, 1d supply, fill #1

## 2022-08-07 MED ORDER — SODIUM CHLORIDE 0.9 % IV SOLN: Freq: Once | INTRAVENOUS | Status: AC

## 2022-08-07 MED ORDER — HEPARIN SOD (PORK) LOCK FLUSH 100 UNIT/ML IV SOLN
500.0000 [IU] | Freq: Once | INTRAVENOUS | Status: AC
  Administered 2022-08-07: 500 [IU]

## 2022-08-07 MED ORDER — METHYLPHENIDATE HCL 5 MG PO TABS
5.0000 mg | ORAL_TABLET | Freq: Two times a day (BID) | ORAL | 0 refills | Status: DC | PRN
  Filled 2022-08-07: qty 60, 15d supply, fill #0

## 2022-08-07 MED ORDER — TRIAMCINOLONE ACETONIDE 0.1 % MT PSTE
PASTE | OROMUCOSAL | 1 refills | Status: DC
  Filled 2022-08-07: qty 5, 7d supply, fill #0
  Filled 2022-08-29: qty 5, 7d supply, fill #1

## 2022-08-07 MED ORDER — SODIUM CHLORIDE 0.9% FLUSH
10.0000 mL | Freq: Once | INTRAVENOUS | Status: AC
  Administered 2022-08-07: 10 mL

## 2022-08-07 NOTE — Patient Instructions (Signed)

## 2022-08-08 ENCOUNTER — Other Ambulatory Visit (HOSPITAL_COMMUNITY): Payer: Self-pay

## 2022-08-08 ENCOUNTER — Inpatient Hospital Stay: Payer: Medicare Other

## 2022-08-08 VITALS — BP 174/69 | HR 85 | Temp 98.0°F | Resp 18

## 2022-08-08 DIAGNOSIS — Z7189 Other specified counseling: Secondary | ICD-10-CM

## 2022-08-08 DIAGNOSIS — Z5111 Encounter for antineoplastic chemotherapy: Secondary | ICD-10-CM | POA: Diagnosis not present

## 2022-08-08 DIAGNOSIS — C5701 Malignant neoplasm of right fallopian tube: Secondary | ICD-10-CM

## 2022-08-08 MED ORDER — HEPARIN SOD (PORK) LOCK FLUSH 100 UNIT/ML IV SOLN
500.0000 [IU] | Freq: Once | INTRAVENOUS | Status: AC
  Administered 2022-08-08: 500 [IU]

## 2022-08-08 MED ORDER — SODIUM CHLORIDE 0.9 % IV SOLN: Freq: Once | INTRAVENOUS | Status: AC

## 2022-08-08 MED ORDER — SODIUM CHLORIDE 0.9% FLUSH
10.0000 mL | Freq: Once | INTRAVENOUS | Status: AC
  Administered 2022-08-08: 10 mL

## 2022-08-08 NOTE — Patient Instructions (Signed)

## 2022-08-09 ENCOUNTER — Inpatient Hospital Stay: Payer: Medicare Other

## 2022-08-09 ENCOUNTER — Other Ambulatory Visit: Payer: Self-pay | Admitting: Hematology and Oncology

## 2022-08-09 VITALS — BP 151/72 | HR 84 | Temp 98.0°F | Resp 19

## 2022-08-09 DIAGNOSIS — C5701 Malignant neoplasm of right fallopian tube: Secondary | ICD-10-CM

## 2022-08-09 DIAGNOSIS — Z7189 Other specified counseling: Secondary | ICD-10-CM

## 2022-08-09 DIAGNOSIS — Z5111 Encounter for antineoplastic chemotherapy: Secondary | ICD-10-CM | POA: Diagnosis not present

## 2022-08-09 MED ORDER — SODIUM CHLORIDE 0.9% FLUSH
10.0000 mL | Freq: Once | INTRAVENOUS | Status: AC | PRN
  Administered 2022-08-09: 10 mL

## 2022-08-09 MED ORDER — ONDANSETRON HCL 4 MG/2ML IJ SOLN
8.0000 mg | Freq: Once | INTRAMUSCULAR | Status: AC
  Administered 2022-08-09: 8 mg via INTRAVENOUS
  Filled 2022-08-09: qty 4

## 2022-08-09 MED ORDER — SODIUM CHLORIDE 0.9 % IV SOLN: Freq: Once | INTRAVENOUS | Status: AC

## 2022-08-09 MED ORDER — HEPARIN SOD (PORK) LOCK FLUSH 100 UNIT/ML IV SOLN
500.0000 [IU] | Freq: Once | INTRAVENOUS | Status: AC | PRN
  Administered 2022-08-09: 500 [IU]

## 2022-08-09 NOTE — Progress Notes (Signed)
Pt complained of n/v during tx. Dr Holly [name] placed an order for IV zofran which this RN administered. Pt reported feeling back at baseline shortly after.  Upon completion of tx, pt was de accessed and suddenly complained of light-headedness and stated "I am going to pass out".  This RN elevated her legs and took vitals. BP 80/43. Holly Hews RN paged Holly Dire PA-C to assess. Holly [name] determined rapid bp shifts and n/v are often experienced by this pt. BP systolic in the 170's at this time.  Holly Dire PA-C advised that this pt be observed for and d/c if bp is back at baseline. Pt was observed by this RN for . No major shifts in bp, and VSS.  Dr Holly [name] and Holly [name] agreed to schedule this pt for more fluids tomorrow. Pt was agreeable. Pt was accompanied to lobby in a wheelchair by her spouse.

## 2022-08-09 NOTE — Patient Instructions (Signed)

## 2022-08-10 ENCOUNTER — Inpatient Hospital Stay: Payer: Medicare Other

## 2022-08-10 VITALS — BP 161/75 | HR 102 | Temp 97.9°F | Resp 16

## 2022-08-10 DIAGNOSIS — C5701 Malignant neoplasm of right fallopian tube: Secondary | ICD-10-CM

## 2022-08-10 DIAGNOSIS — Z7189 Other specified counseling: Secondary | ICD-10-CM

## 2022-08-10 DIAGNOSIS — Z5111 Encounter for antineoplastic chemotherapy: Secondary | ICD-10-CM | POA: Diagnosis not present

## 2022-08-10 MED ORDER — SODIUM CHLORIDE 0.9% FLUSH
10.0000 mL | Freq: Once | INTRAVENOUS | Status: AC | PRN
  Administered 2022-08-10: 10 mL

## 2022-08-10 MED ORDER — SODIUM CHLORIDE 0.9 % IV SOLN: Freq: Once | INTRAVENOUS | Status: AC

## 2022-08-10 MED ORDER — HEPARIN SOD (PORK) LOCK FLUSH 100 UNIT/ML IV SOLN
500.0000 [IU] | Freq: Once | INTRAVENOUS | Status: AC | PRN
  Administered 2022-08-10: 500 [IU]

## 2022-08-12 ENCOUNTER — Telehealth: Payer: Self-pay

## 2022-08-12 NOTE — Telephone Encounter (Signed)
-----   Message from Artis Delay, MD sent at 08/12/2022  7:39 AM EDT ----- Can you call and check on her?

## 2022-08-12 NOTE — Telephone Encounter (Signed)
Called to see how she is doing today. No complaints today. She is able to eat and drink with no problems. She is looking forward to going to the beach.

## 2022-08-18 ENCOUNTER — Other Ambulatory Visit: Payer: Self-pay | Admitting: Hematology and Oncology

## 2022-08-19 ENCOUNTER — Encounter: Payer: Self-pay | Admitting: Hematology and Oncology

## 2022-08-23 ENCOUNTER — Other Ambulatory Visit: Payer: Self-pay | Admitting: Hematology and Oncology

## 2022-08-28 MED FILL — Dexamethasone Sodium Phosphate Inj 100 MG/10ML: INTRAMUSCULAR | Qty: 1 | Status: AC

## 2022-08-28 MED FILL — Fosaprepitant Dimeglumine For IV Infusion 150 MG (Base Eq): INTRAVENOUS | Qty: 5 | Status: AC

## 2022-08-29 ENCOUNTER — Telehealth: Payer: Self-pay | Admitting: Hematology and Oncology

## 2022-08-29 ENCOUNTER — Inpatient Hospital Stay: Payer: Medicare Other | Attending: Hematology and Oncology

## 2022-08-29 ENCOUNTER — Inpatient Hospital Stay: Payer: Medicare Other

## 2022-08-29 ENCOUNTER — Other Ambulatory Visit: Payer: Self-pay

## 2022-08-29 ENCOUNTER — Inpatient Hospital Stay (HOSPITAL_BASED_OUTPATIENT_CLINIC_OR_DEPARTMENT_OTHER): Payer: Medicare Other | Admitting: Hematology and Oncology

## 2022-08-29 ENCOUNTER — Inpatient Hospital Stay: Payer: Medicare Other | Admitting: Dietician

## 2022-08-29 ENCOUNTER — Encounter: Payer: Self-pay | Admitting: Hematology and Oncology

## 2022-08-29 VITALS — BP 148/72

## 2022-08-29 VITALS — BP 182/78 | HR 76 | Temp 98.6°F | Resp 18 | Wt 135.2 lb

## 2022-08-29 DIAGNOSIS — Z5111 Encounter for antineoplastic chemotherapy: Secondary | ICD-10-CM | POA: Diagnosis present

## 2022-08-29 DIAGNOSIS — C7989 Secondary malignant neoplasm of other specified sites: Secondary | ICD-10-CM | POA: Insufficient documentation

## 2022-08-29 DIAGNOSIS — Z5189 Encounter for other specified aftercare: Secondary | ICD-10-CM | POA: Insufficient documentation

## 2022-08-29 DIAGNOSIS — K1231 Oral mucositis (ulcerative) due to antineoplastic therapy: Secondary | ICD-10-CM

## 2022-08-29 DIAGNOSIS — Z7189 Other specified counseling: Secondary | ICD-10-CM

## 2022-08-29 DIAGNOSIS — D61818 Other pancytopenia: Secondary | ICD-10-CM

## 2022-08-29 DIAGNOSIS — C5701 Malignant neoplasm of right fallopian tube: Secondary | ICD-10-CM | POA: Insufficient documentation

## 2022-08-29 DIAGNOSIS — I10 Essential (primary) hypertension: Secondary | ICD-10-CM | POA: Diagnosis not present

## 2022-08-29 LAB — CMP (CANCER CENTER ONLY)
ALT: 6 U/L (ref 0–44)
AST: 13 U/L — ABNORMAL LOW (ref 15–41)
Albumin: 3.8 g/dL (ref 3.5–5.0)
Alkaline Phosphatase: 67 U/L (ref 38–126)
Anion gap: 9 (ref 5–15)
BUN: 19 mg/dL (ref 8–23)
CO2: 24 mmol/L (ref 22–32)
Calcium: 8.9 mg/dL (ref 8.9–10.3)
Chloride: 102 mmol/L (ref 98–111)
Creatinine: 0.83 mg/dL (ref 0.44–1.00)
GFR, Estimated: >60 mL/min (ref >60)
Glucose, Bld: 255 mg/dL — ABNORMAL HIGH (ref 70–99)
Potassium: 4.6 mmol/L (ref 3.5–5.1)
Sodium: 135 mmol/L (ref 135–145)
Total Bilirubin: 0.4 mg/dL (ref 0.3–1.2)
Total Protein: 6.6 g/dL (ref 6.5–8.1)

## 2022-08-29 LAB — CBC WITH DIFFERENTIAL (CANCER CENTER ONLY)
Abs Immature Granulocytes: 0.08 10*3/uL — ABNORMAL HIGH (ref 0.00–0.07)
Basophils Absolute: 0 10*3/uL (ref 0.0–0.1)
Basophils Relative: 0 %
Eosinophils Absolute: 0 10*3/uL (ref 0.0–0.5)
Eosinophils Relative: 0 %
HCT: 31.8 % — ABNORMAL LOW (ref 36.0–46.0)
Hemoglobin: 10.3 g/dL — ABNORMAL LOW (ref 12.0–15.0)
Immature Granulocytes: 1 %
Lymphocytes Relative: 8 %
Lymphs Abs: 0.6 10*3/uL — ABNORMAL LOW (ref 0.7–4.0)
MCH: 34.1 pg — ABNORMAL HIGH (ref 26.0–34.0)
MCHC: 32.4 g/dL (ref 30.0–36.0)
MCV: 105.3 fL — ABNORMAL HIGH (ref 80.0–100.0)
Monocytes Absolute: 0.1 10*3/uL (ref 0.1–1.0)
Monocytes Relative: 1 %
Neutro Abs: 7.1 10*3/uL (ref 1.7–7.7)
Neutrophils Relative %: 90 %
Platelet Count: 249 10*3/uL (ref 150–400)
RBC: 3.02 MIL/uL — ABNORMAL LOW (ref 3.87–5.11)
RDW: 16.5 % — ABNORMAL HIGH (ref 11.5–15.5)
WBC Count: 7.9 10*3/uL (ref 4.0–10.5)
nRBC: 0 % (ref 0.0–0.2)

## 2022-08-29 LAB — SAMPLE TO BLOOD BANK

## 2022-08-29 MED ORDER — FAMOTIDINE 20 MG IN NS 100 ML IVPB
20.0000 mg | Freq: Once | INTRAVENOUS | Status: AC
  Administered 2022-08-29: 20 mg via INTRAVENOUS
  Filled 2022-08-29: qty 100

## 2022-08-29 MED ORDER — SODIUM CHLORIDE 0.9% FLUSH
10.0000 mL | Freq: Once | INTRAVENOUS | Status: AC | PRN
  Administered 2022-08-29: 10 mL

## 2022-08-29 MED ORDER — SODIUM CHLORIDE 0.9 % IV SOLN
278.2500 mg | Freq: Once | INTRAVENOUS | Status: AC
  Administered 2022-08-29: 280 mg via INTRAVENOUS
  Filled 2022-08-29: qty 28

## 2022-08-29 MED ORDER — SODIUM CHLORIDE 0.9 % IV SOLN: Freq: Once | INTRAVENOUS | Status: AC

## 2022-08-29 MED ORDER — SODIUM CHLORIDE 0.9 % IV SOLN
87.5000 mg/m2 | Freq: Once | INTRAVENOUS | Status: AC
  Administered 2022-08-29: 150 mg via INTRAVENOUS
  Filled 2022-08-29: qty 25

## 2022-08-29 MED ORDER — SODIUM CHLORIDE 0.9 % IV SOLN
10.0000 mg | Freq: Once | INTRAVENOUS | Status: AC
  Administered 2022-08-29: 10 mg via INTRAVENOUS
  Filled 2022-08-29: qty 10

## 2022-08-29 MED ORDER — PALONOSETRON HCL INJECTION 0.25 MG/5ML
0.2500 mg | Freq: Once | INTRAVENOUS | Status: AC
  Administered 2022-08-29: 0.25 mg via INTRAVENOUS
  Filled 2022-08-29: qty 5

## 2022-08-29 MED ORDER — CETIRIZINE HCL 10 MG/ML IV SOLN
10.0000 mg | Freq: Once | INTRAVENOUS | Status: AC
  Administered 2022-08-29: 10 mg via INTRAVENOUS
  Filled 2022-08-29: qty 1

## 2022-08-29 MED ORDER — SODIUM CHLORIDE 0.9 % IV SOLN
150.0000 mg | Freq: Once | INTRAVENOUS | Status: AC
  Administered 2022-08-29: 150 mg via INTRAVENOUS
  Filled 2022-08-29: qty 150

## 2022-08-29 NOTE — Assessment & Plan Note (Signed)
Her blood counts are better We will monitor closely and proceed with treatment without delay

## 2022-08-29 NOTE — Progress Notes (Signed)
Nutrition Follow-up:  Patient with adenocarcinoma of right fallopian tube. She is receiving dose reduced carboplatin and taxol q21d.   Met with patient and husband in infusion. She reports altered taste continues. Foods without much flavor she finds spicy. Patient unsure of what she will like from day to day. Patient reports early satiety. She does not like taste of oral nutrition supplements. Patient does like milk.   Medications: reviewed   Labs: glucose 255  Anthropometrics: Wt 135 lb 3.2 oz today decreased   4/11 - 139 lb 6.4 oz    NUTRITION DIAGNOSIS: Inadequate oral intake continues     INTERVENTION:  Discussed strategies for increasing calories and protein (using milk vs water in soups/oatmeal, adding cheese/gravy/sauces) Suggested CIB powder mixed with milk as alternate ONS - sample packets provided for pt to try Encouraged high calorie high protein foods - shake recipes, snack ideas, soft moist high protein foods provided Contact information given     MONITORING, EVALUATION, GOAL: weight trends, intake    NEXT VISIT: To be scheduled with upcoming treatment

## 2022-08-29 NOTE — Assessment & Plan Note (Signed)
She has intermittent debilitating mucositis and dehydration It is improved We discussed importance of frequent small meals and I strongly advised the patient to keep her weight up if possible We will schedule IV fluids support

## 2022-08-29 NOTE — Progress Notes (Signed)
[facility] OFFICE PROGRESS NOTE  Patient Care Team: Default, Provider, MD as PCP - Danella Maiers, MD as Consulting Physician (Hematology and Oncology) Adolphus Birchwood, MD as Consulting Physician (Obstetrics and Gynecology)  ASSESSMENT & PLAN:  Adenocarcinoma of right fallopian tube [facility]) Her recent CT imaging show positive response to therapy However, she tolerated treatment very poorly with uncontrolled hypertension, frequent dehydration, severe pancytopenia and others I plan to continue on reduced dose of chemotherapy Plan to omit bevacizumab for the next few cycles until her blood pressure is back to normal She needed fluid support recently.  She will come back next week for IV fluids for several days. I plan to repeat imaging study end of May for further follow-up  Essential hypertension Her documented blood pressure at home fluctuate widely but still quite high Will continue to hold bevacizumab She will continue her current prescribed antihypertensives  Pancytopenia, acquired (HCC) Her blood counts are better We will monitor closely and proceed with treatment without delay  Mucositis due to antineoplastic therapy She has intermittent debilitating mucositis and dehydration It is improved We discussed importance of frequent small meals and I strongly advised the patient to keep her weight up if possible We will schedule IV fluids support  Orders Placed This Encounter  Procedures   CT CHEST ABDOMEN PELVIS W CONTRAST    Standing Status:   Future    Standing Expiration Date:   08/29/2023    Order Specific Question:   If indicated for the ordered procedure, I authorize the administration of contrast media per Radiology protocol    Answer:   Yes    Order Specific Question:   Does the patient have a contrast media/X-ray dye allergy?    Answer:   No    Order Specific Question:   Preferred imaging location?    Answer:   [facility]    Order Specific  Question:   If indicated for the ordered procedure, I authorize the administration of oral contrast media per Radiology protocol    Answer:   Yes    All questions were answered. The patient knows to call the clinic with any problems, questions or concerns. The total time spent in the appointment was 30 minutes encounter with patients including review of chart and various tests results, discussions about plan of care and coordination of care plan   Artis Delay, MD 08/29/2022 10:11 AM  INTERVAL HISTORY: Please see below for problem oriented charting. she returns for treatment follow-up with her husband She still struggle with fluid intake requiring IV fluid support with each cycle Her energy level has improved She is eating better and has not lost weight I reviewed her documented blood pressure from home Majority of high blood pressure is documented in the evening She is not symptomatic from high blood pressure  REVIEW OF SYSTEMS:   Constitutional: Denies fevers, chills or abnormal weight loss Eyes: Denies blurriness of vision Respiratory: Denies cough, dyspnea or wheezes Cardiovascular: Denies palpitation, chest discomfort or lower extremity swelling Gastrointestinal:  Denies nausea, heartburn or change in bowel habits Skin: Denies abnormal skin rashes Lymphatics: Denies new lymphadenopathy or easy bruising Neurological:Denies numbness, tingling or new weaknesses Behavioral/Psych: Mood is stable, no new changes  All other systems were reviewed with the patient and are negative.  I have reviewed the past medical history, past surgical history, social history and family history with the patient and they are unchanged from previous note.  ALLERGIES:  is allergic to dilaudid [hydromorphone  hcl], morphine and related, and sudafed [pseudoephedrine hcl].  MEDICATIONS:  Current Outpatient Medications  Medication Sig Dispense Refill   ALAWAY 0.035 % ophthalmic solution Place 1 drop into  both eyes 2 (two) times daily as needed (for irritation).     atenolol (TENORMIN) 25 MG tablet Take 1 tablet (25 mg total) by mouth daily. 30 tablet 2   augmented betamethasone dipropionate (DIPROLENE-AF) 0.05 % cream Apply 1 application  topically 2 (two) times daily as needed (to affected sites- for redness or swelling).     cloNIDine (CATAPRES) 0.1 MG tablet TAKE 1 TABLET BY MOUTH EVERY DAY 30 tablet 1   dexamethasone (DECADRON) 4 MG tablet Take 2 tabs at the night before and 2 tab the morning of chemotherapy, every 3 weeks, by mouth x 6 cycles (Patient taking differently: Take 8 mg by mouth See admin instructions. Take 8 mg (2 tablets) by mouth the night before and morning of chemotherapy- every 3 weeks, for 6 cycles) 36 tablet 6   docusate sodium (COLACE) 100 MG capsule Take 100 mg by mouth daily as needed for mild constipation.     doxazosin (CARDURA) 4 MG tablet TAKE 1 TABLET BY MOUTH EVERY DAY 30 tablet 1   lidocaine (XYLOCAINE) 2 % solution Swish and spit 15 mL every 3 hrs as needed for mouth pain from chemo 100 mL 1   lidocaine-prilocaine (EMLA) cream Apply to affected area once daily as directed. (Patient taking differently: 1 application  daily as needed (as directed- to access port).) 30 g 3   lisinopril (ZESTRIL) 20 MG tablet TAKE 1 TABLET BY MOUTH TWICE A DAY 60 tablet 1   magic mouthwash w/lidocaine SOLN Take 5 mLs by mouth 4 (four) times daily as needed for mouth pain. 240 mL 1   MIRALAX 17 GM/SCOOP powder Take 17 g by mouth daily as needed for mild constipation.     ondansetron (ZOFRAN) 8 MG tablet Take 1 tablet (8 mg total) by mouth every 8 (eight) hours as needed for nausea or vomiting. Start on the third day after chemotherapy. 30 tablet 1   oxyCODONE (OXY IR/ROXICODONE) 5 MG immediate release tablet Take 1 tablet by mouth every 4 hours as needed for severe pain. 60 tablet 0   prochlorperazine (COMPAZINE) 10 MG tablet Take 1 tablet (10 mg total) by mouth every 6 (six) hours as  needed for nausea or vomiting. 30 tablet 1   sodium chloride (OCEAN) 0.65 % SOLN nasal spray Place 1 spray into both nostrils as needed for congestion.     triamcinolone (KENALOG) 0.1 % paste Apply to mouth 2 times a day as needed (do not rinse afterwards and avoid eating or drinking for 30 minutes) 60 g 1   triamcinolone cream (KENALOG) 0.1 % Apply 1 Application topically 2 (two) times daily as needed (to affected areas- for irritation).     TYLENOL 8 HOUR ARTHRITIS PAIN 650 MG CR tablet Take 650-1,300 mg by mouth every 8 (eight) hours as needed for pain.     venlafaxine XR (EFFEXOR-XR) 37.5 MG 24 hr capsule TAKE 1 CAPSULE BY MOUTH DAILY WITH BREAKFAST. (Patient taking differently: Take 37.5 mg by mouth daily with breakfast.) 90 capsule 3   No current facility-administered medications for this visit.   Facility-Administered Medications Ordered in Other Visits  Medication Dose Route Frequency Provider Last Rate Last Admin   CARBOplatin (PARAPLATIN) 280 mg in sodium chloride 0.9 % 100 mL chemo infusion  280 mg Intravenous Once Artis Delay, MD  PACLitaxel (TAXOL) 150 mg in sodium chloride 0.9 % 250 mL chemo infusion (> 80mg /m2)  87.5 mg/m2 (Order-Specific) Intravenous Once Bertis Ruddy, Telesa Jeancharles, MD       sodium chloride flush (NS) 0.9 % injection 10 mL  10 mL Intravenous PRN Bertis Ruddy, Jaicob Dia, MD   10 mL at 02/11/17 0839   sodium chloride flush (NS) 0.9 % injection 10 mL  10 mL Intravenous PRN Bertis Ruddy, Sindia Kowalczyk, MD   10 mL at 03/04/17 1510    SUMMARY OF ONCOLOGIC HISTORY: Oncology History Overview Note  Neg genetics High grade serous    Adenocarcinoma of right fallopian tube (HCC)  08/12/2016 Initial Diagnosis   The patient has many months of vague symptoms of fullness in the pelvis, urinary frequency and difficulty with defecation. She denies vaginal bleeding or rectal bleeding.     08/12/2016 Imaging   Abdomen X-ray in the ER: Moderate colonic stool burden without evidence of enteric obstruction    11/13/2016 Imaging   TVUS was performed on 11/13/16 which showed a large hypoechoic lobular mass seen posterior to the uterus measuring 18.2x16.1x11.8cm, blood flow was seen along the periphery of the mass. It was considered to be likely to be a fibroid vs pelvic mass vs ovarian mass. Neither ovary was removed. Moderate amount of free fluid in the pelvis. THe uterus measures 4x10x4cm. The endometrium is 4mm.    12/05/2016 Imaging   MR pelvis 1. Mild limitations as detailed above. 2. Heterogeneous pelvic mass is favored to arise from the right ovary. Favor a solid ovarian neoplasm such as fibroma/Brenner's tumor. Given lesion size and heterogeneous T2 signal out, complicating torsion cannot be excluded. 3. Moderate abdominopelvic ascites, without specific evidence of peritoneal metastasis. Consider further evaluation with contrast-enhanced abdominopelvic CT.    12/26/2016 Pathology Results   1. Uterus, ovaries and fallopian tubes - ADENOCARCINOMA INVOLVING RIGHT FALLOPIAN TUBE, RIGHT AND LEFT OVARIES, UTERINE SEROSA AND PELVIC MASS. - SEE ONCOLOGY TABLE AND COMMENT. - UTERINE CERVIX, ENDOMETRIUM, MYOMETRIUM AND LEFT FALLOPIAN TUBE FREE OF TUMOR. 2. Omentum, resection for tumor - ADENOCARCINOMA. Microscopic Comment 1. ONCOLOGY TABLE - FALLOPIAN TUBE. SEE COMMENT 1. Specimen, including laterality: Uterus, bilateral adnexa, pelvic mass and omentum. 2. Procedure: Hysterectomy with bilateral salpingo-oophorectomy, pelvic mass excision and omentectomy. 3. Lymph node sampling performed: No 4. Tumor site: See comment. 5. Tumor location in fallopian tube: Right fallopian tube fimbria 6. Specimen integrity (intact/ruptured/disrupted): Intact 7. Tumor size (cm): 18 cm, see comment. 8. Histologic type: Adenocarcinoma 9. Grade: High grade 10. Microscopic tumor extension: Tumor involves right fallopian tube, right and left ovaries, uterine serosa, pelvic mass and omentum. 11. Margins: See comment. 12.  Lymph-Vascular invasion: Present 13. Lymph nodes: # examined: 0; # positive: N/A 14. TNM: pT3c, pNX 15. FIGO Stage (based on pathologic findings, needs clinical correlation: III-C 16. Comments: There is an 18 cm pelvic mass which is a high grade adenocarcinoma and there is a 12.2 cm  segment of omentum which is extensively involved with adenocarcinoma. The tumor also involves the fimbria of the right fallopian tube and the parenchyma of the right and left ovaries as well as uterine serosa. The fimbria of the right fallopian has intraepithelial atypia consistent with a precursor lesion and therefore this is most consistent with primary fallopian tube adenocarcinoma. A primary peritoneal serous carcinoma is a less likely possibility.    12/26/2016 Pathology Results   PERITONEAL/ASCITIC FLUID(SPECIMEN 1 OF 1 COLLECTED 12/26/16): POORLY DIFFERENTIATED ADENOCARCINOMA   12/26/2016 Surgery   Procedure(s) Performed: Exploratory laparotomy with total abdominal  hysterectomy, bilateral salpingo-oophorectomy, omentectomy radical tumor debulking for ovarian cancer .   Surgeon: Luisa Dago, MD.      Operative Findings: 20cm left ovarian mass densely adherent to the posterior uterus, right tube and ovary, cervix, sigmoid colon. 10cm omental cake. 4L ascites. 1mm size tumor nodules on the serosa of the terminal ileum and proximal sigmoid colon. 1mm nodules on right diaphragm.    This represented an optimal cytoreduction (R1) with 1mm nodules on intestine and diaphragm representing gross visible disease   01/16/2017 Procedure   Placement of single lumen port a cath via right internal jugular vein. The catheter tip lies at the cavo-atrial junction. A power injectable port a cath was placed and is ready for immediate use.   01/17/2017 Imaging   1. 3.5 x 7.2 x 4.6 cm fluid collection along the vaginal cuff, posterior the bladder and extending into the right adnexal space. This lesion demonstrates rim enhancement. Imaging  features could be related to a loculated postoperative seroma or hematoma. Superinfection cannot be excluded by CT. Given debulking surgery was 3 weeks ago, this entire structure is un likely to represent neoplasm, but peritoneal involvement could have this appearance. 2. Small fluid collection in the left para colic gutter without rim enhancement. 3. Irregular/nodular appearance of the peritoneal M in the anatomic pelvis with areas of subtle nodularity between the stomach and the spleen. Close attention in these regions on follow-up recommended as metastatic disease is a concern. 4. Mildly enlarged hepatoduodenal ligament lymph node. Attention on follow-up recommended.   01/20/2017 Tumor Marker   Patient's tumor was tested for the following markers: CA-125 Results of the tumor marker test revealed 46.6   01/28/2017 Genetic Testing       Negative genetic testing on the [place] panel.  The [facility] gene panel offered by [employer] includes sequencing and deletion/duplication testing of the following 28 genes: APC, ATM, BARD1, BMPR1A, BRCA1, BRCA2, BRIP1, CHD1, CDK4, CDKN2A, CHEK2, EPCAM (large rearrangement only), MLH1, MSH2, MSH6, MUTYH, NBN, PALB2, PMS2, PTEN, RAD51C, RAD51D, SMAD4, STK11, and TP53. Sequencing was performed for select regions of POLE and POLD1, and large rearrangement analysis was performed for select regions of GREM1. The report date is January 28, 2017.  HRD testing looking for genomic instability and BRCA mutations was negative.  The report date of this test is January 27, 2017.    01/31/2017 Tumor Marker   Patient's tumor was tested for the following markers: CA-125 Results of the tumor marker test revealed 22.4   03/04/2017 Tumor Marker   Patient's tumor was tested for the following markers: CA-125 Results of the tumor marker test revealed 13.8   04/18/2017 Tumor Marker   Patient's tumor was tested for the following markers: CA-125 Results of the  tumor marker test revealed 11.9   05/05/2017 Tumor Marker   Patient's tumor was tested for the following markers: CA-125 Results of the tumor marker test revealed 12   05/26/2017 Tumor Marker   Patient's tumor was tested for the following markers: CA-125 Results of the tumor marker test revealed 10.6   05/26/2017 Imaging   1. A previously noted postoperative fluid collection in the low pelvis and residual ascites seen on the prior examination has resolved on today's study. No findings to suggest residual/recurrent disease on today's examination. No definite solid organ metastasis identified in the abdomen or pelvis. No lymphadenopathy. 2. Aortic atherosclerosis.   06/10/2017 Procedure   Successful right IJ vein Port-A-Cath explant.   03/09/2018 Tumor Marker  Patient's tumor was tested for the following markers: CA-125 Results of the tumor marker test revealed 12.1   05/26/2018 Tumor Marker   Patient's tumor was tested for the following markers: CA-125 Results of the tumor marker test revealed 13.8   09/09/2018 Tumor Marker   Patient's tumor was tested for the following markers: CA-125 Results of the tumor marker test revealed 23.9   12/18/2018 Tumor Marker   Patient's tumor was tested for the following markers: CA-125 Results of the tumor marker test revealed 43.8   02/27/2019 - 02/28/2019 Hospital Admission   She was admitted for bowel obstruction   02/27/2019 Imaging   1. High-grade small bowel obstruction with transition point in the pelvis in an area of suspected desmoplastic reaction surrounding a 1.3 cm spiculated nodule in the mesentery, suspicious for carcinoid. There is hyperenhancement near the tip of the appendix and loss of the normal fat plane between it and the adjacent sigmoid colon, potentially the site of primary lesion. 2. Multiple new small subcentimeter hyperdense lesions scattered along the liver capsule, concerning for metastases. Enlarged hyperenhancing gastrohepatic  and portacaval lymph nodes concerning for nodal metastases. 3. Very mild right hydroureteronephrosis to the level of the desmoplastic reaction in the pelvis, potentially involving the right ureter. 4. Infraumbilical ventral abdominal wall diastasis containing a small nondilated portion of transverse colon. 5. Trace ascites. 6. Cholelithiasis. 7.  Aortic atherosclerosis (ICD10-I70.0).     03/05/2019 Tumor Marker   Patient's tumor was tested for the following markers: CA-125 Results of the tumor marker test revealed 70.1   03/12/2019 Echocardiogram   IMPRESSIONS     1. Left ventricular ejection fraction, by visual estimation, is 60 to 65%. The left ventricle has normal function. There is no left ventricular hypertrophy.  2. Left ventricular diastolic parameters are consistent with Grade I diastolic dysfunction (impaired relaxation).  3. Global right ventricle has normal systolic function.The right ventricular size is normal. No increase in right ventricular wall thickness.  4. Left atrial size was normal.  5. Right atrial size was normal.  6. The pericardium was not assessed.  7. The mitral valve is normal in structure. No evidence of mitral valve regurgitation.  8. The tricuspid valve is normal in structure. Tricuspid valve regurgitation is trivial.  9. The aortic valve is normal in structure. Aortic valve regurgitation is not visualized. 10. The pulmonic valve was grossly normal. Pulmonic valve regurgitation is not visualized. 11. The average left ventricular global longitudinal strain is -17.6 %.   03/16/2019 Procedure   Successful placement of a right internal jugular approach power injectable Port-A-Cath. The catheter is ready for immediate use.     04/19/2019 Tumor Marker   Patient's tumor was tested for the following markers: CA-125 Results of the tumor marker test revealed 39.8   05/17/2019 Tumor Marker   Patient's tumor was tested for the following markers: CA-125 Results  of the tumor marker test revealed 27   05/28/2019 Tumor Marker   Patient's tumor was tested for the following markers: CA-125 Results of the tumor marker test revealed 27.7.   06/11/2019 Imaging   1. No new or progressive metastatic disease in the abdomen or pelvis. 2. Tiny noncalcified perisplenic implant is decreased. Calcified pericardiophrenic, retroperitoneal and bilateral inguinal lymph nodes and scattered small calcified perihepatic and perisplenic implants are stable. 3.  Aortic Atherosclerosis (ICD10-I70.0).       06/17/2019 Echocardiogram    1. Left ventricular ejection fraction, by estimation, is 65 to 70%. The left ventricle has normal  function. The left ventricle has no regional wall motion abnormalities. Left ventricular diastolic parameters were normal.  2. Right ventricular systolic function is normal. The right ventricular size is normal.  3. The mitral valve is normal in structure and function. No evidence of mitral valve regurgitation. No evidence of mitral stenosis.  4. The aortic valve is normal in structure and function. Aortic valve regurgitation is not visualized. No aortic stenosis is present.   06/21/2019 Tumor Marker   Patient's tumor was tested for the following markers: CA-125 Results of the tumor marker test revealed 18.7   07/19/2019 Tumor Marker   Patient's tumor was tested for the following markers: CA-125 Results of the tumor marker test revealed 16.7   08/30/2019 Tumor Marker   Patient's tumor was tested for the following markers: CA-125. Results of the tumor marker test revealed 13.9   09/15/2019 Echocardiogram    1. Left ventricular ejection fraction, by estimation, is 65 to 70%. The left ventricle has normal function. The left ventricle has no regional wall motion abnormalities. Left ventricular diastolic parameters are consistent with Grade I diastolic dysfunction (impaired relaxation). The average left ventricular global longitudinal strain is 18.1 %.  The global longitudinal strain is normal.  2. Right ventricular systolic function is normal. The right ventricular size is normal.  3. The mitral valve is normal in structure. No evidence of mitral valve regurgitation. No evidence of mitral stenosis.  4. The aortic valve is normal in structure. Aortic valve regurgitation is not visualized. No aortic stenosis is present.  5. The inferior vena cava is normal in size with greater than 50% respiratory variability, suggesting right atrial pressure of 3 mmHg.     09/24/2019 Imaging   1. Stable exam. No new or progressive metastatic disease on today's study. 2. Small subcapsular hypodensity in the lateral spleen, new in the interval, but indeterminate. Attention on follow-up recommended. 3. The calcified upper abdominal and groin lymph nodes are stable as are the scattered calcified and noncalcified peritoneal implants. 4. Stable midline ventral hernia containing a short segment of colon without complicating features. 5. Aortic Atherosclerosis (ICD10-I70.0).     10/04/2019 - 09/21/2021 Chemotherapy   Patient is on Treatment Plan : ovarian Bevacizumab q21d     10/04/2019 Tumor Marker   Patient's tumor was tested for the following markers: CA-125. Results of the tumor marker test revealed 12.9.   11/01/2019 Tumor Marker   Patient's tumor was tested for the following markers: CA-125 Results of the tumor marker test revealed 15.4   12/13/2019 Tumor Marker   Patient's tumor was tested for the following markers: CA-125 Results of the tumor marker test revealed 14.8   12/31/2019 Imaging   1. Unchanged tiny peritoneal nodule adjacent to the spleen measuring 6 mm. Other previously noted peritoneal nodules are imperceptible on present examination. 2. Unchanged prominent, partially calcified portacaval lymph node and bilateral inguinal lymph nodes. 3. Unchanged subtle, nonspecific subcapsular lesion of the spleen.  4. No evidence of new metastatic disease in  the abdomen or pelvis. 5. Status post hysterectomy. 6. Unchanged low midline ventral abdominal hernia containing a single loop of nonobstructed transverse colon. 7. Cholelithiasis. 8. Aortic Atherosclerosis (ICD10-I70.0).       01/03/2020 Tumor Marker   Patient's tumor was tested for the following markers: CA-125 Results of the tumor marker test revealed 11.7.   01/24/2020 Tumor Marker   Patient's tumor was tested for the following markers: CA-125. Results of the tumor marker test revealed 15.1   03/06/2020 Tumor  Marker   Patient's tumor was tested for the following markers: CA-125. Results of the tumor marker test revealed 20.3   03/27/2020 Tumor Marker   Patient's tumor was tested for the following markers: CA-125 Results of the tumor marker test revealed 23.7   05/11/2020 Tumor Marker   Patient's tumor was tested for the following markers: CA-125. Results of the tumor marker test revealed 25.4   06/23/2020 Imaging   1. Cholelithiasis with gallbladder wall thickening and mild infiltrative edema in the porta hepatis, raising the possibility of acute cholecystitis. There is truncation of the common bile duct distally and distal choledocholithiasis is difficult to exclude. Correlate with bilirubin levels. If clinically warranted, MRCP could be utilized for further characterization. 2. Stable tiny perisplenic nodule. 3. Other imaging findings of potential clinical significance: Small type 1 hiatal hernia. Prominent stool throughout the colon favors constipation. Ventral infraumbilical hernia contains transverse colon without findings of strangulation or obstruction. Small supraumbilical hernia contains adipose tissue. Lumbar degenerative disc disease at L5-S1. Stable tiny nodule along the lateral margin of the spleen, nonspecific. 4. Aortic atherosclerosis.     12/15/2020 Imaging   No evidence of recurrent or metastatic carcinoma within the abdomen or pelvis.   Stable ventral hernia  containing transverse colon. No evidence of bowel obstruction or strangulation.   Cholelithiasis. No radiographic evidence of cholecystitis.   Tiny hiatal hernia.   Aortic Atherosclerosis (ICD10-I70.0).     06/11/2021 Imaging   1. Numerous sub 4 mm pulmonary nodules in the lungs some of which have a cavitary appearance. I think it is unlikely this is metastatic disease and more likely a possible immunotherapy related process such as a sarcoid like reaction. Recommend full chest CT further evaluation. 2. Stable small calcified retroperitoneal and mesenteric lymph nodes. No findings suspicious for recurrent peritoneal carcinomatosis. 3. Stable lower abdominal wall hernia containing part of the transverse colon. 4. Cholelithiasis.   09/27/2021 Imaging   1. Numerous small pulmonary nodules throughout the lungs, the majority of which are cavitary. These are all subcentimeter, however nodules that were seen in the bilateral lung bases on prior CT dated 06/08/2021 appear slightly increased in size and number. Behavior is highly suspicious for pulmonary metastatic disease despite the unusual appearance, however as previously reported general differential considerations include atypical infection and inflammation, such as drug-induced sarcoidosis like reaction.  2. Unchanged calcified lymph nodes in the abdomen and pelvis, consistent with treated nodal metastases. 3. Cholelithiasis with wall thickening and pericholecystic fluid. This appearance is similar to prior examination and suggests chronic cholecystitis. Correlate for clinical symptoms. Mild intrahepatic biliary ductal dilatation, similar to prior examination. 4. Midline ventral hernia containing a single nonobstructed loop of mid transverse colon.     12/25/2021 Imaging   1. Again noted are multiple thin walled cavitary lung nodules throughout both lungs. These are similar in size and multiplicity when compared with the exam from 09/26/2021.  Etiology remains indeterminate, although metastatic disease cannot be excluded with a high degree of certainty. 2. Stable calcified upper abdominal lymph nodes consistent with treated nodal metastases. 3. Gallstones. 4. Aortic Atherosclerosis (ICD10-I70.0).   01/30/2022 Tumor Marker   Patient's tumor was tested for the following markers: CA-125. Results of the tumor marker test revealed 59.3.   01/31/2022 Imaging   1. No substantial interval change in exam. 2. Innumerable tiny cavitary pulmonary nodules in the lung bases. As noted previously, metastatic disease would be distinct consideration although imaging features are nonspecific and infectious/inflammatory etiology is not excluded. 3. Calcified  nodal disease in the upper abdomen and pelvis shows no substantial interval change. Scattered noncalcified nodules in the mesentery are similar to prior. 4. Markedly large stool volume throughout the colon. Imaging features suggestive of clinical constipation. 5. Cholelithiasis with ill-defined appearance of the gallbladder wall. Appearance is stable in the interval. 6. Trace free fluid in the cul-de-sac. 7. Midline ventral hernia contains a short segment of transverse colon without complicating features. 8. Aortic Atherosclerosis (ICD10-I70.0).   03/07/2022 Surgery   Procedure Date:  03/07/2022  Pre-operative Diagnosis:  Symptomatic cholelithiasis   Post-operative Diagnosis: Symptomatic cholelithiasis, peritoneal implants with history of right fallopian tube cancer.   Procedure:   Robotic assisted cholecystectomy with ICG FireFly cholangiogram Robotic assisted abdominal wall peritoneal implant excisional biopsy x 3.   Surgeon:  Howie Ill, MD  Specimens:   Gallbladder Peritoneal implants x 3    Indications for Procedure:  This is a 67 y.o. female who presents with abdominal pain and workup revealing symptomatic cholelithiasis.  The benefits, complications, treatment options,  and expected outcomes were discussed with the patient. The risks of bleeding, infection, recurrence of symptoms, failure to resolve symptoms, bile duct damage, bile duct leak, retained common bile duct stone, bowel injury, and need for further procedures were all discussed with the patient and she was willing to proceed.     03/07/2022 Pathology Results   SURGICAL PATHOLOGY  CASE: [phone]  PATIENT: Edwin Cap  Surgical Pathology Report   Specimen Submitted:  A. Gallbladder  B. Abdominal wall   Clinical History: Symptomatic cholelithiasis   DIAGNOSIS:  A. GALLBLADDER; CHOLECYSTECTOMY:  - CHRONIC CHOLECYSTITIS WITH CHOLELITHIASIS.  - NEGATIVE FOR DYSPLASIA AND MALIGNANCY.  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   B. SOFT TISSUE, ABDOMINAL WALL; BIOPSY:  - POSITIVE FOR MALIGNANCY.  - METASTATIC CARCINOMA, COMPATIBLE WITH HIGH-GRADE SEROUS CARCINOMA OF GYNECOLOGIC PRIMARY.   Comment:  Immunohistochemical studies were performed on neoplastic cells in both sources. Tumor cells in both are positive for CK7, Pax-8, and WT-1, supporting the above diagnosis. P53 demonstrates strong and diffuse expression, indicative of mutated expression pattern.   There is sufficient tissue present for ancillary molecular testing if desired.    04/04/2022 Imaging   1. Mild progression of partially calcified peritoneal metastatic implants surrounding the liver, in the porta hepatis and in the periumbilical region. No generalized ascites or peritoneal nodularity status post omentectomy. 2. Grossly stable cavitary lesions at both lung bases, presumably reflecting metastatic disease. 3. No other evidence of progressive metastatic disease within the abdomen or pelvis. 4. Enlarging ventral hernia containing a portion of the transverse colon. No evidence of bowel obstruction or inflammation. 5.  Aortic Atherosclerosis  (ICD10-I70.0).   04/08/2022 -  Chemotherapy   Patient is on Treatment Plan : OVARIAN Carboplatin + Paclitaxel + Bevacizumab q21d      04/09/2022 Tumor Marker   Patient's tumor was tested for the following markers: CA-125. Results of the tumor marker test revealed 86.7.   05/22/2022 Tumor Marker   Patient's tumor was tested for the following markers: CA-125. Results of the tumor marker test revealed 49.7.   06/05/2022 Imaging   1. New tiny bilateral pleural effusions. 2. Once again there are several small cavitary lesions in the lungs. Many of these are becoming more cavitary with less soft tissue elements and are smaller today. 3. Soft tissue thickening and nodularity along the surface of the liver appears to be overall decreasing from previous examination.  Some of these areas have calcification. 4. Mesenteric nodules as well as retroperitoneal and mesenteric nodes appear similar to slightly improved. The inguinal nodes seen previously are similar. 5. No developing new mass lesion, ascites or lymph node enlargement seen. 6. Of interest the density of the lumen of the renal pelvis bilaterally is increased on the left compared to the right in the portal venous phase. This is of uncertain etiology and significance. Please correlate with the urinalysis such as for hematuria.     PHYSICAL EXAMINATION: ECOG PERFORMANCE STATUS: 1 - Symptomatic but completely ambulatory  Vitals:   08/29/22 0822  BP: (!) 182/78  Pulse: 76  Resp: 18  Temp: 98.6 F (37 C)  SpO2: 98%   Filed Weights   08/29/22 0822  Weight: 135 lb 3.2 oz (61.3 kg)    GENERAL:alert, no distress and comfortable  NEURO: alert & oriented x 3 with fluent speech, no focal motor/sensory deficits  LABORATORY DATA:  I have reviewed the data as listed    Component Value Date/Time   NA 135 08/29/2022 0754   NA 139 04/14/2017 0742   K 4.6 08/29/2022 0754   K 4.0 04/14/2017 0742   CL 102 08/29/2022 0754   CO2 24 08/29/2022  0754   CO2 21 (L) 04/14/2017 0742   GLUCOSE 255 (H) 08/29/2022 0754   GLUCOSE 247 (H) 04/14/2017 0742   BUN 19 08/29/2022 0754   BUN 13.1 04/14/2017 0742   CREATININE 0.83 08/29/2022 0754   CREATININE 0.9 04/14/2017 0742   CALCIUM 8.9 08/29/2022 0754   CALCIUM 9.2 04/14/2017 0742   PROT 6.6 08/29/2022 0754   PROT 7.3 04/14/2017 0742   ALBUMIN 3.8 08/29/2022 0754   ALBUMIN 3.9 04/14/2017 0742   AST 13 (L) 08/29/2022 0754   AST 17 04/14/2017 0742   ALT 6 08/29/2022 0754   ALT 14 04/14/2017 0742   ALKPHOS 67 08/29/2022 0754   ALKPHOS 70 04/14/2017 0742   BILITOT 0.4 08/29/2022 0754   BILITOT 0.37 04/14/2017 0742   GFRNONAA >60 08/29/2022 0754   GFRAA >60 01/24/2020 0924   GFRAA >60 07/19/2019 0951    No results found for: "SPEP", "UPEP"  Lab Results  Component Value Date   WBC 7.9 08/29/2022   NEUTROABS 7.1 08/29/2022   HGB 10.3 (L) 08/29/2022   HCT 31.8 (L) 08/29/2022   MCV 105.3 (H) 08/29/2022   PLT 249 08/29/2022      Chemistry      Component Value Date/Time   NA 135 08/29/2022 0754   NA 139 04/14/2017 0742   K 4.6 08/29/2022 0754   K 4.0 04/14/2017 0742   CL 102 08/29/2022 0754   CO2 24 08/29/2022 0754   CO2 21 (L) 04/14/2017 0742   BUN 19 08/29/2022 0754   BUN 13.1 04/14/2017 0742   CREATININE 0.83 08/29/2022 0754   CREATININE 0.9 04/14/2017 0742      Component Value Date/Time   CALCIUM 8.9 08/29/2022 0754   CALCIUM 9.2 04/14/2017 0742   ALKPHOS 67 08/29/2022 0754   ALKPHOS 70 04/14/2017 0742   AST 13 (L) 08/29/2022 0754   AST 17 04/14/2017 0742   ALT 6 08/29/2022 0754   ALT 14 04/14/2017 0742   BILITOT 0.4 08/29/2022 0754   BILITOT 0.37 04/14/2017 0742

## 2022-08-29 NOTE — Assessment & Plan Note (Signed)
Her recent CT imaging show positive response to therapy However, she tolerated treatment very poorly with uncontrolled hypertension, frequent dehydration, severe pancytopenia and others I plan to continue on reduced dose of chemotherapy Plan to omit bevacizumab for the next few cycles until her blood pressure is back to normal She needed fluid support recently.  She will come back next week for IV fluids for several days. I plan to repeat imaging study end of May for further follow-up

## 2022-08-29 NOTE — Assessment & Plan Note (Signed)
Her documented blood pressure at home fluctuate widely but still quite high Will continue to hold bevacizumab She will continue her current prescribed antihypertensives 

## 2022-08-30 ENCOUNTER — Inpatient Hospital Stay: Payer: Medicare Other

## 2022-08-30 ENCOUNTER — Other Ambulatory Visit: Payer: Self-pay

## 2022-08-30 VITALS — BP 139/72 | HR 55 | Temp 97.1°F | Resp 19

## 2022-08-30 DIAGNOSIS — Z7189 Other specified counseling: Secondary | ICD-10-CM

## 2022-08-30 DIAGNOSIS — C5701 Malignant neoplasm of right fallopian tube: Secondary | ICD-10-CM

## 2022-08-30 DIAGNOSIS — Z5111 Encounter for antineoplastic chemotherapy: Secondary | ICD-10-CM | POA: Diagnosis not present

## 2022-08-30 MED ORDER — ONDANSETRON HCL 4 MG/2ML IJ SOLN
8.0000 mg | Freq: Once | INTRAMUSCULAR | Status: AC
  Administered 2022-08-30: 8 mg via INTRAVENOUS
  Filled 2022-08-30: qty 4

## 2022-08-30 MED ORDER — SODIUM CHLORIDE 0.9% FLUSH
10.0000 mL | Freq: Once | INTRAVENOUS | Status: AC | PRN
  Administered 2022-08-30: 10 mL

## 2022-08-30 MED ORDER — SODIUM CHLORIDE 0.9 % IV SOLN: Freq: Once | INTRAVENOUS | Status: AC

## 2022-08-30 MED ORDER — HEPARIN SOD (PORK) LOCK FLUSH 100 UNIT/ML IV SOLN
500.0000 [IU] | Freq: Once | INTRAVENOUS | Status: AC | PRN
  Administered 2022-08-30: 500 [IU]

## 2022-08-30 NOTE — Patient Instructions (Signed)

## 2022-08-31 ENCOUNTER — Other Ambulatory Visit: Payer: Self-pay | Admitting: Hematology and Oncology

## 2022-08-31 ENCOUNTER — Inpatient Hospital Stay: Payer: Medicare Other

## 2022-08-31 VITALS — BP 122/65 | HR 53 | Temp 98.1°F | Resp 18

## 2022-08-31 DIAGNOSIS — C5701 Malignant neoplasm of right fallopian tube: Secondary | ICD-10-CM

## 2022-08-31 DIAGNOSIS — Z5111 Encounter for antineoplastic chemotherapy: Secondary | ICD-10-CM | POA: Diagnosis not present

## 2022-08-31 DIAGNOSIS — Z7189 Other specified counseling: Secondary | ICD-10-CM

## 2022-08-31 LAB — CA 125: Cancer Antigen (CA) 125: 38.3 U/mL — ABNORMAL HIGH (ref 0.0–38.1)

## 2022-08-31 MED ORDER — FLUCONAZOLE 200 MG PO TABS: 200.0000 mg | ORAL_TABLET | Freq: Every day | ORAL | 0 refills | Status: DC

## 2022-08-31 MED ORDER — PEGFILGRASTIM-CBQV 6 MG/0.6ML ~~LOC~~ SOSY
6.0000 mg | PREFILLED_SYRINGE | Freq: Once | SUBCUTANEOUS | Status: AC
  Administered 2022-08-31: 6 mg via SUBCUTANEOUS
  Filled 2022-08-31: qty 0.6

## 2022-08-31 NOTE — Patient Instructions (Signed)

## 2022-08-31 NOTE — Progress Notes (Signed)
I was covering Saturday clinic and infusion called saying patient is complaining of thrush pain and needed some medication for this. I didn't see the patient. I did send a short prescription for fluconazole based on nurse's findings. She should get future refills from Dr Bertis Ruddy if needed.  Holly [name]

## 2022-09-02 ENCOUNTER — Inpatient Hospital Stay: Payer: Medicare Other

## 2022-09-02 ENCOUNTER — Other Ambulatory Visit: Payer: Self-pay

## 2022-09-02 ENCOUNTER — Telehealth: Payer: Self-pay

## 2022-09-02 ENCOUNTER — Encounter: Payer: Self-pay | Admitting: Hematology and Oncology

## 2022-09-02 VITALS — BP 137/61 | HR 68 | Temp 98.0°F | Resp 18 | Wt 132.0 lb

## 2022-09-02 DIAGNOSIS — Z7189 Other specified counseling: Secondary | ICD-10-CM

## 2022-09-02 DIAGNOSIS — Z5111 Encounter for antineoplastic chemotherapy: Secondary | ICD-10-CM | POA: Diagnosis not present

## 2022-09-02 DIAGNOSIS — C5701 Malignant neoplasm of right fallopian tube: Secondary | ICD-10-CM

## 2022-09-02 MED ORDER — FLUCONAZOLE 100 MG PO TABS: 100.0000 mg | ORAL_TABLET | Freq: Every day | ORAL | 0 refills | Status: DC

## 2022-09-02 MED ORDER — SODIUM CHLORIDE 0.9 % IV SOLN: Freq: Once | INTRAVENOUS | Status: AC

## 2022-09-02 MED ORDER — ONDANSETRON HCL 4 MG/2ML IJ SOLN
8.0000 mg | Freq: Once | INTRAMUSCULAR | Status: AC
  Administered 2022-09-02: 8 mg via INTRAVENOUS
  Filled 2022-09-02: qty 4

## 2022-09-02 MED ORDER — SODIUM CHLORIDE 0.9% FLUSH
10.0000 mL | Freq: Once | INTRAVENOUS | Status: AC | PRN
  Administered 2022-09-02: 10 mL

## 2022-09-02 MED ORDER — HEPARIN SOD (PORK) LOCK FLUSH 100 UNIT/ML IV SOLN
500.0000 [IU] | Freq: Once | INTRAVENOUS | Status: AC | PRN
  Administered 2022-09-02: 500 [IU]

## 2022-09-02 NOTE — Telephone Encounter (Signed)
-----   Message from Artis Delay, MD sent at 09/02/2022  8:26 AM EDT ----- Pls call her Ask if her thrush is better yet, if not call in for a few more days of fluconazole for total 7 days Is she drinking enough? Agree with holding off BP meds

## 2022-09-02 NOTE — Telephone Encounter (Signed)
Called and given below message. She verbalized understanding and appreciated the call. She is getting IV fluids now and daily until Wednesday.

## 2022-09-02 NOTE — Telephone Encounter (Signed)
Continued note. Holly [name] is a little better, Diflucan Rx sent to preferred pharmacy. Denies injury from fall at home. She will call the office back for questions/concerns and if more IV fluids needed.

## 2022-09-02 NOTE — Progress Notes (Signed)
Pt reports passing out and falling twice over the weekend d/t low blood pressure. The first time she fell on her concrete driveway, the second her husband caught her in the bathroom. No physical injuries noted. Pt A&Ox4. MD aware of all falls and pt spoke with them on the phone briefly this AM.

## 2022-09-02 NOTE — Patient Instructions (Signed)

## 2022-09-02 NOTE — Telephone Encounter (Signed)
-----   Message from Ni Gorsuch, MD sent at 09/02/2022  8:26 AM EDT ----- Pls call her Ask if her thrush is better yet, if not call in for a few more days of fluconazole for total 7 days Is she drinking enough? Agree with holding off BP meds  

## 2022-09-03 ENCOUNTER — Inpatient Hospital Stay: Payer: Medicare Other

## 2022-09-03 VITALS — BP 139/68 | HR 78 | Temp 98.2°F | Resp 18

## 2022-09-03 DIAGNOSIS — Z7189 Other specified counseling: Secondary | ICD-10-CM

## 2022-09-03 DIAGNOSIS — Z5111 Encounter for antineoplastic chemotherapy: Secondary | ICD-10-CM | POA: Diagnosis not present

## 2022-09-03 DIAGNOSIS — C5701 Malignant neoplasm of right fallopian tube: Secondary | ICD-10-CM

## 2022-09-03 MED ORDER — ONDANSETRON HCL 4 MG/2ML IJ SOLN
8.0000 mg | Freq: Once | INTRAMUSCULAR | Status: AC
  Administered 2022-09-03: 8 mg via INTRAVENOUS
  Filled 2022-09-03: qty 4

## 2022-09-03 MED ORDER — SODIUM CHLORIDE 0.9 % IV SOLN: Freq: Once | INTRAVENOUS | Status: AC

## 2022-09-03 NOTE — Patient Instructions (Signed)

## 2022-09-04 ENCOUNTER — Inpatient Hospital Stay: Payer: Medicare Other

## 2022-09-04 VITALS — BP 152/63 | HR 78 | Temp 97.9°F | Resp 18

## 2022-09-04 DIAGNOSIS — Z5111 Encounter for antineoplastic chemotherapy: Secondary | ICD-10-CM | POA: Diagnosis not present

## 2022-09-04 DIAGNOSIS — C5701 Malignant neoplasm of right fallopian tube: Secondary | ICD-10-CM

## 2022-09-04 DIAGNOSIS — Z7189 Other specified counseling: Secondary | ICD-10-CM

## 2022-09-04 MED ORDER — SODIUM CHLORIDE 0.9% FLUSH
10.0000 mL | Freq: Once | INTRAVENOUS | Status: AC | PRN
  Administered 2022-09-04: 10 mL

## 2022-09-04 MED ORDER — SODIUM CHLORIDE 0.9 % IV SOLN: Freq: Once | INTRAVENOUS | Status: AC

## 2022-09-04 MED ORDER — HEPARIN SOD (PORK) LOCK FLUSH 100 UNIT/ML IV SOLN
500.0000 [IU] | Freq: Once | INTRAVENOUS | Status: AC | PRN
  Administered 2022-09-04: 500 [IU]

## 2022-09-04 MED ORDER — ONDANSETRON HCL 4 MG/2ML IJ SOLN: 8.0000 mg | Freq: Once | INTRAMUSCULAR | Status: DC

## 2022-09-04 NOTE — Patient Instructions (Signed)

## 2022-09-10 ENCOUNTER — Ambulatory Visit (HOSPITAL_BASED_OUTPATIENT_CLINIC_OR_DEPARTMENT_OTHER)
Admission: RE | Admit: 2022-09-10 | Discharge: 2022-09-10 | Disposition: A | Discharge status: Home / Self Care | Payer: Medicare Other | Source: Ambulatory Visit | Attending: Hematology and Oncology | Admitting: Hematology and Oncology

## 2022-09-10 ENCOUNTER — Encounter (HOSPITAL_BASED_OUTPATIENT_CLINIC_OR_DEPARTMENT_OTHER): Payer: Self-pay

## 2022-09-10 DIAGNOSIS — C5701 Malignant neoplasm of right fallopian tube: Secondary | ICD-10-CM | POA: Insufficient documentation

## 2022-09-10 MED ORDER — IOHEXOL 300 MG/ML  SOLN
100.0000 mL | Freq: Once | INTRAMUSCULAR | Status: AC | PRN
  Administered 2022-09-10: 100 mL via INTRAVENOUS

## 2022-09-12 ENCOUNTER — Encounter: Payer: Self-pay | Admitting: Hematology and Oncology

## 2022-09-12 ENCOUNTER — Inpatient Hospital Stay (HOSPITAL_BASED_OUTPATIENT_CLINIC_OR_DEPARTMENT_OTHER): Payer: Medicare Other | Admitting: Hematology and Oncology

## 2022-09-12 ENCOUNTER — Telehealth: Payer: Self-pay | Admitting: Pharmacy Technician

## 2022-09-12 ENCOUNTER — Other Ambulatory Visit: Payer: Self-pay

## 2022-09-12 ENCOUNTER — Other Ambulatory Visit (HOSPITAL_COMMUNITY): Payer: Self-pay

## 2022-09-12 ENCOUNTER — Telehealth: Payer: Self-pay

## 2022-09-12 ENCOUNTER — Telehealth: Payer: Self-pay | Admitting: Oncology

## 2022-09-12 VITALS — BP 151/72 | HR 86 | Temp 97.5°F | Resp 18 | Ht 65.0 in | Wt 134.0 lb

## 2022-09-12 DIAGNOSIS — Z5111 Encounter for antineoplastic chemotherapy: Secondary | ICD-10-CM | POA: Diagnosis not present

## 2022-09-12 DIAGNOSIS — D61818 Other pancytopenia: Secondary | ICD-10-CM | POA: Diagnosis not present

## 2022-09-12 DIAGNOSIS — C5701 Malignant neoplasm of right fallopian tube: Secondary | ICD-10-CM | POA: Diagnosis not present

## 2022-09-12 DIAGNOSIS — I10 Essential (primary) hypertension: Secondary | ICD-10-CM

## 2022-09-12 MED ORDER — NIRAPARIB TOSYLATE 100 MG PO TABS
100.0000 mg | ORAL_TABLET | Freq: Every day | ORAL | 3 refills | Status: DC
  Filled 2022-09-12: qty 30, 30d supply, fill #0

## 2022-09-12 NOTE — Assessment & Plan Note (Signed)
Her blood pressure is now improved and borderline high I recommend resumption of atenolol She will continue to monitor her blood pressure closely I do not recommend the patient to restart clonidine or lisinopril for now

## 2022-09-12 NOTE — Progress Notes (Signed)
[facility] OFFICE PROGRESS NOTE  Patient Care Team: Default, Provider, MD as PCP - General Artis Delay, MD as Consulting Physician (Hematology and Oncology) Adolphus Birchwood, MD as Consulting Physician (Obstetrics and Gynecology)  ASSESSMENT & PLAN:  Adenocarcinoma of right fallopian tube [facility]) Unfortunately, see TE imaging was not available I reviewed multiple CT imaging with the patient and her husband Overall, she has significant positive response to therapy, including resolution of a lot of abnormal nodules noted from prior CT imaging of the chest She is recovered from recent chemotherapy Due to difficulties with uncontrolled hypertension, I do not recommend using bevacizumab as long-term maintenance treatment We discussed the risk, benefits, side effects of treatment with niraparib and she agreed Due to history of severe pancytopenia, the plan to start her on low-dose 100 mg daily starting on June 3 We will get assistance from pharmacy team for insurance prior authorization Plan to see her within the week after the start date of treatment for toxicity review  Essential hypertension Her blood pressure is now improved and borderline high I recommend resumption of atenolol She will continue to monitor her blood pressure closely I do not recommend the patient to restart clonidine or lisinopril for now  Pancytopenia, acquired [facility]) She is not symptomatic from anemia Observe closely  Orders Placed This Encounter  Procedures   CBC with Differential/Platelet    Standing Status:   Standing    Number of Occurrences:   22    Standing Expiration Date:   09/12/2023   Comprehensive metabolic panel    Standing Status:   Standing    Number of Occurrences:   33    Standing Expiration Date:   09/12/2023   CA 125    Standing Status:   Standing    Number of Occurrences:   11    Standing Expiration Date:   09/12/2023    All questions were answered. The patient knows to call the  clinic with any problems, questions or concerns. The total time spent in the appointment was 40 minutes encounter with patients including review of chart and various tests results, discussions about plan of care and coordination of care plan   Artis Delay, MD 09/12/2022 9:49 AM  INTERVAL HISTORY: Please see below for problem oriented charting. she returns for treatment follow-up with her husband We spent majority of our time reviewing CT imaging studies.  We discussed future follow-up and the role of maintenance treatment with niraparib  REVIEW OF SYSTEMS:   Constitutional: Denies fevers, chills or abnormal weight loss Eyes: Denies blurriness of vision Ears, nose, mouth, throat, and face: Denies mucositis or sore throat Respiratory: Denies cough, dyspnea or wheezes Cardiovascular: Denies palpitation, chest discomfort or lower extremity swelling Gastrointestinal:  Denies nausea, heartburn or change in bowel habits Skin: Denies abnormal skin rashes Lymphatics: Denies new lymphadenopathy or easy bruising Neurological:Denies numbness, tingling or new weaknesses Behavioral/Psych: Mood is stable, no new changes  All other systems were reviewed with the patient and are negative.  I have reviewed the past medical history, past surgical history, social history and family history with the patient and they are unchanged from previous note.  ALLERGIES:  is allergic to dilaudid [hydromorphone hcl], morphine and codeine, and sudafed [pseudoephedrine hcl].  MEDICATIONS:  Current Outpatient Medications  Medication Sig Dispense Refill   niraparib tosylate (ZEJULA) 100 MG tablet Take 1 tablet (100 mg total) by mouth daily. May take at bedtime to reduce nausea and vomiting. 30 tablet 3   ALAWAY  0.035 % ophthalmic solution Place 1 drop into both eyes 2 (two) times daily as needed (for irritation).     atenolol (TENORMIN) 25 MG tablet Take 1 tablet (25 mg total) by mouth daily. 30 tablet 2   cloNIDine  (CATAPRES) 0.1 MG tablet TAKE 1 TABLET BY MOUTH EVERY DAY 30 tablet 1   docusate sodium (COLACE) 100 MG capsule Take 100 mg by mouth daily as needed for mild constipation.     doxazosin (CARDURA) 4 MG tablet TAKE 1 TABLET BY MOUTH EVERY DAY 30 tablet 1   lidocaine-prilocaine (EMLA) cream Apply to affected area once daily as directed. (Patient taking differently: 1 application  daily as needed (as directed- to access port).) 30 g 3   lisinopril (ZESTRIL) 20 MG tablet TAKE 1 TABLET BY MOUTH TWICE A DAY 60 tablet 1   MIRALAX 17 GM/SCOOP powder Take 17 g by mouth daily as needed for mild constipation.     ondansetron (ZOFRAN) 8 MG tablet Take 1 tablet (8 mg total) by mouth every 8 (eight) hours as needed for nausea or vomiting. Start on the third day after chemotherapy. 30 tablet 1   prochlorperazine (COMPAZINE) 10 MG tablet Take 1 tablet (10 mg total) by mouth every 6 (six) hours as needed for nausea or vomiting. 30 tablet 1   sodium chloride (OCEAN) 0.65 % SOLN nasal spray Place 1 spray into both nostrils as needed for congestion.     TYLENOL 8 HOUR ARTHRITIS PAIN 650 MG CR tablet Take 650-1,300 mg by mouth every 8 (eight) hours as needed for pain.     venlafaxine XR (EFFEXOR-XR) 37.5 MG 24 hr capsule TAKE 1 CAPSULE BY MOUTH DAILY WITH BREAKFAST. (Patient taking differently: Take 37.5 mg by mouth daily with breakfast.) 90 capsule 3   No current facility-administered medications for this visit.   Facility-Administered Medications Ordered in Other Visits  Medication Dose Route Frequency Provider Last Rate Last Admin   sodium chloride flush (NS) 0.9 % injection 10 mL  10 mL Intravenous PRN Bertis Ruddy, Stavroula Rohde, MD   10 mL at 02/11/17 0839   sodium chloride flush (NS) 0.9 % injection 10 mL  10 mL Intravenous PRN Bertis Ruddy, Unnamed Hino, MD   10 mL at 03/04/17 1510    SUMMARY OF ONCOLOGIC HISTORY: Oncology History Overview Note  Neg genetics High grade serous    Adenocarcinoma of right fallopian tube (HCC)  08/12/2016  Initial Diagnosis   The patient has many months of vague symptoms of fullness in the pelvis, urinary frequency and difficulty with defecation. She denies vaginal bleeding or rectal bleeding.     08/12/2016 Imaging   Abdomen X-ray in the ER: Moderate colonic stool burden without evidence of enteric obstruction   11/13/2016 Imaging   TVUS was performed on 11/13/16 which showed a large hypoechoic lobular mass seen posterior to the uterus measuring 18.2x16.1x11.8cm, blood flow was seen along the periphery of the mass. It was considered to be likely to be a fibroid vs pelvic mass vs ovarian mass. Neither ovary was removed. Moderate amount of free fluid in the pelvis. THe uterus measures 4x10x4cm. The endometrium is 4mm.    12/05/2016 Imaging   MR pelvis 1. Mild limitations as detailed above. 2. Heterogeneous pelvic mass is favored to arise from the right ovary. Favor a solid ovarian neoplasm such as fibroma/Brenner's tumor. Given lesion size and heterogeneous T2 signal out, complicating torsion cannot be excluded. 3. Moderate abdominopelvic ascites, without specific evidence of peritoneal metastasis. Consider further evaluation with contrast-enhanced  abdominopelvic CT.    12/26/2016 Pathology Results   1. Uterus, ovaries and fallopian tubes - ADENOCARCINOMA INVOLVING RIGHT FALLOPIAN TUBE, RIGHT AND LEFT OVARIES, UTERINE SEROSA AND PELVIC MASS. - SEE ONCOLOGY TABLE AND COMMENT. - UTERINE CERVIX, ENDOMETRIUM, MYOMETRIUM AND LEFT FALLOPIAN TUBE FREE OF TUMOR. 2. Omentum, resection for tumor - ADENOCARCINOMA. Microscopic Comment 1. ONCOLOGY TABLE - FALLOPIAN TUBE. SEE COMMENT 1. Specimen, including laterality: Uterus, bilateral adnexa, pelvic mass and omentum. 2. Procedure: Hysterectomy with bilateral salpingo-oophorectomy, pelvic mass excision and omentectomy. 3. Lymph node sampling performed: No 4. Tumor site: See comment. 5. Tumor location in fallopian tube: Right fallopian tube fimbria 6.  Specimen integrity (intact/ruptured/disrupted): Intact 7. Tumor size (cm): 18 cm, see comment. 8. Histologic type: Adenocarcinoma 9. Grade: High grade 10. Microscopic tumor extension: Tumor involves right fallopian tube, right and left ovaries, uterine serosa, pelvic mass and omentum. 11. Margins: See comment. 12. Lymph-Vascular invasion: Present 13. Lymph nodes: # examined: 0; # positive: N/A 14. TNM: pT3c, pNX 15. FIGO Stage (based on pathologic findings, needs clinical correlation: III-C 16. Comments: There is an 18 cm pelvic mass which is a high grade adenocarcinoma and there is a 12.2 cm  segment of omentum which is extensively involved with adenocarcinoma. The tumor also involves the fimbria of the right fallopian tube and the parenchyma of the right and left ovaries as well as uterine serosa. The fimbria of the right fallopian has intraepithelial atypia consistent with a precursor lesion and therefore this is most consistent with primary fallopian tube adenocarcinoma. A primary peritoneal serous carcinoma is a less likely possibility.    12/26/2016 Pathology Results   PERITONEAL/ASCITIC FLUID(SPECIMEN 1 OF 1 COLLECTED 12/26/16): POORLY DIFFERENTIATED ADENOCARCINOMA   12/26/2016 Surgery   Procedure(s) Performed: Exploratory laparotomy with total abdominal hysterectomy, bilateral salpingo-oophorectomy, omentectomy radical tumor debulking for ovarian cancer .   Surgeon: Luisa Dago, MD.      Operative Findings: 20cm left ovarian mass densely adherent to the posterior uterus, right tube and ovary, cervix, sigmoid colon. 10cm omental cake. 4L ascites. 1mm size tumor nodules on the serosa of the terminal ileum and proximal sigmoid colon. 1mm nodules on right diaphragm.    This represented an optimal cytoreduction (R1) with 1mm nodules on intestine and diaphragm representing gross visible disease   01/16/2017 Procedure   Placement of single lumen port a cath via right internal jugular vein. The  catheter tip lies at the cavo-atrial junction. A power injectable port a cath was placed and is ready for immediate use.   01/17/2017 Imaging   1. 3.5 x 7.2 x 4.6 cm fluid collection along the vaginal cuff, posterior the bladder and extending into the right adnexal space. This lesion demonstrates rim enhancement. Imaging features could be related to a loculated postoperative seroma or hematoma. Superinfection cannot be excluded by CT. Given debulking surgery was 3 weeks ago, this entire structure is un likely to represent neoplasm, but peritoneal involvement could have this appearance. 2. Small fluid collection in the left para colic gutter without rim enhancement. 3. Irregular/nodular appearance of the peritoneal M in the anatomic pelvis with areas of subtle nodularity between the stomach and the spleen. Close attention in these regions on follow-up recommended as metastatic disease is a concern. 4. Mildly enlarged hepatoduodenal ligament lymph node. Attention on follow-up recommended.   01/20/2017 Tumor Marker   Patient's tumor was tested for the following markers: CA-125 Results of the tumor marker test revealed 46.6   01/28/2017 Genetic Testing  Negative genetic testing on the [facility] panel.  The [facility] gene panel offered by [employer] includes sequencing and deletion/duplication testing of the following 28 genes: APC, ATM, BARD1, BMPR1A, BRCA1, BRCA2, BRIP1, CHD1, CDK4, CDKN2A, CHEK2, EPCAM (large rearrangement only), MLH1, MSH2, MSH6, MUTYH, NBN, PALB2, PMS2, PTEN, RAD51C, RAD51D, SMAD4, STK11, and TP53. Sequencing was performed for select regions of POLE and POLD1, and large rearrangement analysis was performed for select regions of GREM1. The report date is January 28, 2017.  HRD testing looking for genomic instability and BRCA mutations was negative.  The report date of this test is January 27, 2017.    01/31/2017 Tumor Marker   Patient's tumor was tested for the  following markers: CA-125 Results of the tumor marker test revealed 22.4   03/04/2017 Tumor Marker   Patient's tumor was tested for the following markers: CA-125 Results of the tumor marker test revealed 13.8   04/18/2017 Tumor Marker   Patient's tumor was tested for the following markers: CA-125 Results of the tumor marker test revealed 11.9   05/05/2017 Tumor Marker   Patient's tumor was tested for the following markers: CA-125 Results of the tumor marker test revealed 12   05/26/2017 Tumor Marker   Patient's tumor was tested for the following markers: CA-125 Results of the tumor marker test revealed 10.6   05/26/2017 Imaging   1. A previously noted postoperative fluid collection in the low pelvis and residual ascites seen on the prior examination has resolved on today's study. No findings to suggest residual/recurrent disease on today's examination. No definite solid organ metastasis identified in the abdomen or pelvis. No lymphadenopathy. 2. Aortic atherosclerosis.   06/10/2017 Procedure   Successful right IJ vein Port-A-Cath explant.   03/09/2018 Tumor Marker   Patient's tumor was tested for the following markers: CA-125 Results of the tumor marker test revealed 12.1   05/26/2018 Tumor Marker   Patient's tumor was tested for the following markers: CA-125 Results of the tumor marker test revealed 13.8   09/09/2018 Tumor Marker   Patient's tumor was tested for the following markers: CA-125 Results of the tumor marker test revealed 23.9   12/18/2018 Tumor Marker   Patient's tumor was tested for the following markers: CA-125 Results of the tumor marker test revealed 43.8   02/27/2019 - 02/28/2019 Hospital Admission   She was admitted for bowel obstruction   02/27/2019 Imaging   1. High-grade small bowel obstruction with transition point in the pelvis in an area of suspected desmoplastic reaction surrounding a 1.3 cm spiculated nodule in the mesentery, suspicious for carcinoid. There  is hyperenhancement near the tip of the appendix and loss of the normal fat plane between it and the adjacent sigmoid colon, potentially the site of primary lesion. 2. Multiple new small subcentimeter hyperdense lesions scattered along the liver capsule, concerning for metastases. Enlarged hyperenhancing gastrohepatic and portacaval lymph nodes concerning for nodal metastases. 3. Very mild right hydroureteronephrosis to the level of the desmoplastic reaction in the pelvis, potentially involving the right ureter. 4. Infraumbilical ventral abdominal wall diastasis containing a small nondilated portion of transverse colon. 5. Trace ascites. 6. Cholelithiasis. 7.  Aortic atherosclerosis (ICD10-I70.0).     03/05/2019 Tumor Marker   Patient's tumor was tested for the following markers: CA-125 Results of the tumor marker test revealed 70.1   03/12/2019 Echocardiogram   IMPRESSIONS     1. Left ventricular ejection fraction, by visual estimation, is 60 to 65%. The left ventricle has normal function. There  is no left ventricular hypertrophy.  2. Left ventricular diastolic parameters are consistent with Grade I diastolic dysfunction (impaired relaxation).  3. Global right ventricle has normal systolic function.The right ventricular size is normal. No increase in right ventricular wall thickness.  4. Left atrial size was normal.  5. Right atrial size was normal.  6. The pericardium was not assessed.  7. The mitral valve is normal in structure. No evidence of mitral valve regurgitation.  8. The tricuspid valve is normal in structure. Tricuspid valve regurgitation is trivial.  9. The aortic valve is normal in structure. Aortic valve regurgitation is not visualized. 10. The pulmonic valve was grossly normal. Pulmonic valve regurgitation is not visualized. 11. The average left ventricular global longitudinal strain is -17.6 %.   03/16/2019 Procedure   Successful placement of a right internal jugular  approach power injectable Port-A-Cath. The catheter is ready for immediate use.     04/19/2019 Tumor Marker   Patient's tumor was tested for the following markers: CA-125 Results of the tumor marker test revealed 39.8   05/17/2019 Tumor Marker   Patient's tumor was tested for the following markers: CA-125 Results of the tumor marker test revealed 27   05/28/2019 Tumor Marker   Patient's tumor was tested for the following markers: CA-125 Results of the tumor marker test revealed 27.7.   06/11/2019 Imaging   1. No new or progressive metastatic disease in the abdomen or pelvis. 2. Tiny noncalcified perisplenic implant is decreased. Calcified pericardiophrenic, retroperitoneal and bilateral inguinal lymph nodes and scattered small calcified perihepatic and perisplenic implants are stable. 3.  Aortic Atherosclerosis (ICD10-I70.0).       06/17/2019 Echocardiogram    1. Left ventricular ejection fraction, by estimation, is 65 to 70%. The left ventricle has normal function. The left ventricle has no regional wall motion abnormalities. Left ventricular diastolic parameters were normal.  2. Right ventricular systolic function is normal. The right ventricular size is normal.  3. The mitral valve is normal in structure and function. No evidence of mitral valve regurgitation. No evidence of mitral stenosis.  4. The aortic valve is normal in structure and function. Aortic valve regurgitation is not visualized. No aortic stenosis is present.   06/21/2019 Tumor Marker   Patient's tumor was tested for the following markers: CA-125 Results of the tumor marker test revealed 18.7   07/19/2019 Tumor Marker   Patient's tumor was tested for the following markers: CA-125 Results of the tumor marker test revealed 16.7   08/30/2019 Tumor Marker   Patient's tumor was tested for the following markers: CA-125. Results of the tumor marker test revealed 13.9   09/15/2019 Echocardiogram    1. Left ventricular  ejection fraction, by estimation, is 65 to 70%. The left ventricle has normal function. The left ventricle has no regional wall motion abnormalities. Left ventricular diastolic parameters are consistent with Grade I diastolic dysfunction (impaired relaxation). The average left ventricular global longitudinal strain is 18.1 %. The global longitudinal strain is normal.  2. Right ventricular systolic function is normal. The right ventricular size is normal.  3. The mitral valve is normal in structure. No evidence of mitral valve regurgitation. No evidence of mitral stenosis.  4. The aortic valve is normal in structure. Aortic valve regurgitation is not visualized. No aortic stenosis is present.  5. The inferior vena cava is normal in size with greater than 50% respiratory variability, suggesting right atrial pressure of 3 mmHg.     09/24/2019 Imaging   1. Stable  exam. No new or progressive metastatic disease on today's study. 2. Small subcapsular hypodensity in the lateral spleen, new in the interval, but indeterminate. Attention on follow-up recommended. 3. The calcified upper abdominal and groin lymph nodes are stable as are the scattered calcified and noncalcified peritoneal implants. 4. Stable midline ventral hernia containing a short segment of colon without complicating features. 5. Aortic Atherosclerosis (ICD10-I70.0).     10/04/2019 - 09/21/2021 Chemotherapy   Patient is on Treatment Plan : ovarian Bevacizumab q21d     10/04/2019 Tumor Marker   Patient's tumor was tested for the following markers: CA-125. Results of the tumor marker test revealed 12.9.   11/01/2019 Tumor Marker   Patient's tumor was tested for the following markers: CA-125 Results of the tumor marker test revealed 15.4   12/13/2019 Tumor Marker   Patient's tumor was tested for the following markers: CA-125 Results of the tumor marker test revealed 14.8   12/31/2019 Imaging   1. Unchanged tiny peritoneal nodule adjacent to  the spleen measuring 6 mm. Other previously noted peritoneal nodules are imperceptible on present examination. 2. Unchanged prominent, partially calcified portacaval lymph node and bilateral inguinal lymph nodes. 3. Unchanged subtle, nonspecific subcapsular lesion of the spleen.  4. No evidence of new metastatic disease in the abdomen or pelvis. 5. Status post hysterectomy. 6. Unchanged low midline ventral abdominal hernia containing a single loop of nonobstructed transverse colon. 7. Cholelithiasis. 8. Aortic Atherosclerosis (ICD10-I70.0).       01/03/2020 Tumor Marker   Patient's tumor was tested for the following markers: CA-125 Results of the tumor marker test revealed 11.7.   01/24/2020 Tumor Marker   Patient's tumor was tested for the following markers: CA-125. Results of the tumor marker test revealed 15.1   03/06/2020 Tumor Marker   Patient's tumor was tested for the following markers: CA-125. Results of the tumor marker test revealed 20.3   03/27/2020 Tumor Marker   Patient's tumor was tested for the following markers: CA-125 Results of the tumor marker test revealed 23.7   05/11/2020 Tumor Marker   Patient's tumor was tested for the following markers: CA-125. Results of the tumor marker test revealed 25.4   06/23/2020 Imaging   1. Cholelithiasis with gallbladder wall thickening and mild infiltrative edema in the porta hepatis, raising the possibility of acute cholecystitis. There is truncation of the common bile duct distally and distal choledocholithiasis is difficult to exclude. Correlate with bilirubin levels. If clinically warranted, MRCP could be utilized for further characterization. 2. Stable tiny perisplenic nodule. 3. Other imaging findings of potential clinical significance: Small type 1 hiatal hernia. Prominent stool throughout the colon favors constipation. Ventral infraumbilical hernia contains transverse colon without findings of strangulation or obstruction. Small  supraumbilical hernia contains adipose tissue. Lumbar degenerative disc disease at L5-S1. Stable tiny nodule along the lateral margin of the spleen, nonspecific. 4. Aortic atherosclerosis.     12/15/2020 Imaging   No evidence of recurrent or metastatic carcinoma within the abdomen or pelvis.   Stable ventral hernia containing transverse colon. No evidence of bowel obstruction or strangulation.   Cholelithiasis. No radiographic evidence of cholecystitis.   Tiny hiatal hernia.   Aortic Atherosclerosis (ICD10-I70.0).     06/11/2021 Imaging   1. Numerous sub 4 mm pulmonary nodules in the lungs some of which have a cavitary appearance. I think it is unlikely this is metastatic disease and more likely a possible immunotherapy related process such as a sarcoid like reaction. Recommend full chest CT further evaluation. 2.  Stable small calcified retroperitoneal and mesenteric lymph nodes. No findings suspicious for recurrent peritoneal carcinomatosis. 3. Stable lower abdominal wall hernia containing part of the transverse colon. 4. Cholelithiasis.   09/27/2021 Imaging   1. Numerous small pulmonary nodules throughout the lungs, the majority of which are cavitary. These are all subcentimeter, however nodules that were seen in the bilateral lung bases on prior CT dated 06/08/2021 appear slightly increased in size and number. Behavior is highly suspicious for pulmonary metastatic disease despite the unusual appearance, however as previously reported general differential considerations include atypical infection and inflammation, such as drug-induced sarcoidosis like reaction.  2. Unchanged calcified lymph nodes in the abdomen and pelvis, consistent with treated nodal metastases. 3. Cholelithiasis with wall thickening and pericholecystic fluid. This appearance is similar to prior examination and suggests chronic cholecystitis. Correlate for clinical symptoms. Mild intrahepatic biliary ductal dilatation,  similar to prior examination. 4. Midline ventral hernia containing a single nonobstructed loop of mid transverse colon.     12/25/2021 Imaging   1. Again noted are multiple thin walled cavitary lung nodules throughout both lungs. These are similar in size and multiplicity when compared with the exam from 09/26/2021. Etiology remains indeterminate, although metastatic disease cannot be excluded with a high degree of certainty. 2. Stable calcified upper abdominal lymph nodes consistent with treated nodal metastases. 3. Gallstones. 4. Aortic Atherosclerosis (ICD10-I70.0).   01/30/2022 Tumor Marker   Patient's tumor was tested for the following markers: CA-125. Results of the tumor marker test revealed 59.3.   01/31/2022 Imaging   1. No substantial interval change in exam. 2. Innumerable tiny cavitary pulmonary nodules in the lung bases. As noted previously, metastatic disease would be distinct consideration although imaging features are nonspecific and infectious/inflammatory etiology is not excluded. 3. Calcified nodal disease in the upper abdomen and pelvis shows no substantial interval change. Scattered noncalcified nodules in the mesentery are similar to prior. 4. Markedly large stool volume throughout the colon. Imaging features suggestive of clinical constipation. 5. Cholelithiasis with ill-defined appearance of the gallbladder wall. Appearance is stable in the interval. 6. Trace free fluid in the cul-de-sac. 7. Midline ventral hernia contains a short segment of transverse colon without complicating features. 8. Aortic Atherosclerosis (ICD10-I70.0).   03/07/2022 Surgery   Procedure Date:  03/07/2022  Pre-operative Diagnosis:  Symptomatic cholelithiasis   Post-operative Diagnosis: Symptomatic cholelithiasis, peritoneal implants with history of right fallopian tube cancer.   Procedure:   Robotic assisted cholecystectomy with ICG FireFly cholangiogram Robotic assisted abdominal wall  peritoneal implant excisional biopsy x 3.   Surgeon:  Howie Ill, MD  Specimens:   Gallbladder Peritoneal implants x 3    Indications for Procedure:  This is a 67 y.o. female who presents with abdominal pain and workup revealing symptomatic cholelithiasis.  The benefits, complications, treatment options, and expected outcomes were discussed with the patient. The risks of bleeding, infection, recurrence of symptoms, failure to resolve symptoms, bile duct damage, bile duct leak, retained common bile duct stone, bowel injury, and need for further procedures were all discussed with the patient and she was willing to proceed.     03/07/2022 Pathology Results   SURGICAL PATHOLOGY  CASE: [phone]  PATIENT: Holly [name]  Surgical Pathology Report   Specimen Submitted:  A. Gallbladder  B. Abdominal wall   Clinical History: Symptomatic cholelithiasis   DIAGNOSIS:  A. GALLBLADDER; CHOLECYSTECTOMY:  - CHRONIC CHOLECYSTITIS WITH CHOLELITHIASIS.  - NEGATIVE FOR DYSPLASIA AND MALIGNANCY.  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   B. SOFT TISSUE, ABDOMINAL WALL; BIOPSY:  - POSITIVE FOR MALIGNANCY.  - METASTATIC CARCINOMA, COMPATIBLE WITH HIGH-GRADE SEROUS CARCINOMA OF GYNECOLOGIC PRIMARY.   Comment:  Immunohistochemical studies were performed on neoplastic cells in both sources. Tumor cells in both are positive for CK7, Pax-8, and WT-1, supporting the above diagnosis. P53 demonstrates strong and diffuse expression, indicative of mutated expression pattern.   There is sufficient tissue present for ancillary molecular testing if desired.    04/04/2022 Imaging   1. Mild progression of partially calcified peritoneal metastatic implants surrounding the liver, in the porta hepatis and in the periumbilical region. No generalized ascites or peritoneal nodularity status post omentectomy. 2. Grossly stable  cavitary lesions at both lung bases, presumably reflecting metastatic disease. 3. No other evidence of progressive metastatic disease within the abdomen or pelvis. 4. Enlarging ventral hernia containing a portion of the transverse colon. No evidence of bowel obstruction or inflammation. 5.  Aortic Atherosclerosis (ICD10-I70.0).   04/08/2022 - 08/31/2022 Chemotherapy   Patient is on Treatment Plan : OVARIAN Carboplatin + Paclitaxel + Bevacizumab q21d      04/09/2022 Tumor Marker   Patient's tumor was tested for the following markers: CA-125. Results of the tumor marker test revealed 86.7.   05/22/2022 Tumor Marker   Patient's tumor was tested for the following markers: CA-125. Results of the tumor marker test revealed 49.7.   06/05/2022 Imaging   1. New tiny bilateral pleural effusions. 2. Once again there are several small cavitary lesions in the lungs. Many of these are becoming more cavitary with less soft tissue elements and are smaller today. 3. Soft tissue thickening and nodularity along the surface of the liver appears to be overall decreasing from previous examination. Some of these areas have calcification. 4. Mesenteric nodules as well as retroperitoneal and mesenteric nodes appear similar to slightly improved. The inguinal nodes seen previously are similar. 5. No developing new mass lesion, ascites or lymph node enlargement seen. 6. Of interest the density of the lumen of the renal pelvis bilaterally is increased on the left compared to the right in the portal venous phase. This is of uncertain etiology and significance. Please correlate with the urinalysis such as for hematuria.   09/02/2022 Tumor Marker   Patient's tumor was tested for the following markers: CA-125. Results of the tumor marker test revealed 38.3.     PHYSICAL EXAMINATION: ECOG PERFORMANCE STATUS: 1 - Symptomatic but completely ambulatory  Vitals:   09/12/22 0846  BP: (!) 151/72  Pulse: 86  Resp: 18   Temp: (!) 97.5 F (36.4 C)  SpO2: 100%   Filed Weights   09/12/22 0846  Weight: 134 lb (60.8 kg)    GENERAL:alert, no distress and comfortable  NEURO: alert & oriented x 3 with fluent speech, no focal motor/sensory deficits  LABORATORY DATA:  I have reviewed the data as listed    Component Value Date/Time   NA 135 08/29/2022 0754   NA 139 04/14/2017 0742   K 4.6 08/29/2022 0754   K 4.0 04/14/2017 0742   CL 102 08/29/2022 0754   CO2 24 08/29/2022 0754   CO2 21 (L) 04/14/2017 0742   GLUCOSE 255 (H) 08/29/2022 0754   GLUCOSE 247 (H) 04/14/2017 0742   BUN 19 08/29/2022 0754   BUN 13.1 04/14/2017 0742   CREATININE 0.83 08/29/2022 0754   CREATININE 0.9 04/14/2017 0742   CALCIUM 8.9 08/29/2022 0754   CALCIUM 9.2 04/14/2017 0742  PROT 6.6 08/29/2022 0754   PROT 7.3 04/14/2017 0742   ALBUMIN 3.8 08/29/2022 0754   ALBUMIN 3.9 04/14/2017 0742   AST 13 (L) 08/29/2022 0754   AST 17 04/14/2017 0742   ALT 6 08/29/2022 0754   ALT 14 04/14/2017 0742   ALKPHOS 67 08/29/2022 0754   ALKPHOS 70 04/14/2017 0742   BILITOT 0.4 08/29/2022 0754   BILITOT 0.37 04/14/2017 0742   GFRNONAA >60 08/29/2022 0754   GFRAA >60 01/24/2020 0924   GFRAA >60 07/19/2019 0951    No results found for: "SPEP", "UPEP"  Lab Results  Component Value Date   WBC 7.9 08/29/2022   NEUTROABS 7.1 08/29/2022   HGB 10.3 (L) 08/29/2022   HCT 31.8 (L) 08/29/2022   MCV 105.3 (H) 08/29/2022   PLT 249 08/29/2022      Chemistry      Component Value Date/Time   NA 135 08/29/2022 0754   NA 139 04/14/2017 0742   K 4.6 08/29/2022 0754   K 4.0 04/14/2017 0742   CL 102 08/29/2022 0754   CO2 24 08/29/2022 0754   CO2 21 (L) 04/14/2017 0742   BUN 19 08/29/2022 0754   BUN 13.1 04/14/2017 0742   CREATININE 0.83 08/29/2022 0754   CREATININE 0.9 04/14/2017 0742      Component Value Date/Time   CALCIUM 8.9 08/29/2022 0754   CALCIUM 9.2 04/14/2017 0742   ALKPHOS 67 08/29/2022 0754   ALKPHOS 70 04/14/2017 0742    AST 13 (L) 08/29/2022 0754   AST 17 04/14/2017 0742   ALT 6 08/29/2022 0754   ALT 14 04/14/2017 0742   BILITOT 0.4 08/29/2022 0754   BILITOT 0.37 04/14/2017 0742       RADIOGRAPHIC STUDIES: I have reviewed CT imaging with the patient I have personally reviewed the radiological images as listed and agreed with the findings in the report.

## 2022-09-12 NOTE — Assessment & Plan Note (Signed)
Unfortunately, see TE imaging was not available I reviewed multiple CT imaging with the patient and her husband Overall, she has significant positive response to therapy, including resolution of a lot of abnormal nodules noted from prior CT imaging of the chest She is recovered from recent chemotherapy Due to difficulties with uncontrolled hypertension, I do not recommend using bevacizumab as long-term maintenance treatment We discussed the risk, benefits, side effects of treatment with niraparib and she agreed Due to history of severe pancytopenia, the plan to start her on low-dose 100 mg daily starting on June 3 We will get assistance from pharmacy team for insurance prior authorization Plan to see her within the week after the start date of treatment for toxicity review

## 2022-09-12 NOTE — Telephone Encounter (Signed)
Oral Oncology Patient Advocate Encounter   Received notification that prior authorization for Zejula is required.   PA submitted on 09/12/22 Key [id number] Status is pending     [name], CPhT-Adv Oncology Pharmacy Patient Advocate [facility] Direct Number: [phone]  Fax: [phone]

## 2022-09-12 NOTE — Assessment & Plan Note (Signed)
She is not symptomatic from anemia Observe closely

## 2022-09-12 NOTE — Telephone Encounter (Signed)
Oral Oncology Patient Advocate Encounter  Prior Authorization for Holly [name] has been approved.    PA# [id number] Effective dates: 09/12/22 through 04/21/98  Patients co-pay is $2,936.46.    [name], CPhT-Adv Oncology Pharmacy Patient Advocate [facility] Direct Number: [phone]  Fax: [phone]

## 2022-09-12 NOTE — Telephone Encounter (Signed)
Oral Oncology Patient Advocate Encounter   Began application for assistance for Zejula through [employer].   Application will be submitted upon completion of necessary supporting documentation.   [employer] phone number [phone].   I will continue to check the status until final determination.   [name], CPhT-Adv Oncology Pharmacy Patient Advocate [facility] Direct Number: [phone]  Fax: [phone]

## 2022-09-12 NOTE — Telephone Encounter (Signed)
Called Holly [name] and let her know that the results from her CT scan have been released to MyChart and that Dr. Bertis Ruddy said they are exactly what they had discussed.  [name] verbalized understanding and agreement and didn't have any questions.

## 2022-09-12 NOTE — Telephone Encounter (Signed)
Oral Oncology Pharmacist Encounter  Received new prescription for Zejula (niraparib) for the treatment of adenocarcinoma of the right fallopian tube, planned duration until disease progression or unacceptable toxicity.  Labs from 08/29/2022  assessed, no interventions needed. Prescription dose and frequency assessed for appropriateness. Prescription dose decreased per MD.   Current medication list in Epic reviewed, no significant/ relevant DDIs with Zejula identified.   Evaluated chart and no patient barriers to medication adherence noted.   Patient agreement for treatment documented in MD note on 09/12/22.  Prescription has been e-scribed to the [facility] for benefits analysis and approval.  Oral Oncology Clinic will continue to follow for insurance authorization, copayment issues, initial counseling and start date.  Bethel Born, PharmD Hematology/Oncology Clinical Pharmacist [facility] Oral Chemotherapy Navigation Clinic [phone] 09/12/2022 9:28 AM

## 2022-09-13 NOTE — Telephone Encounter (Signed)
Oral Oncology Patient Advocate Encounter   Submitted application for assistance for Zejula to [employer].   Application submitted via e-fax to [phone]   [employer] phone number [phone].   I will continue to check the status until final determination.   [name], CPhT-Adv Oncology Pharmacy Patient Advocate [facility] Direct Number: [phone]  Fax: [phone]

## 2022-09-20 ENCOUNTER — Telehealth: Payer: Self-pay

## 2022-09-20 NOTE — Telephone Encounter (Signed)
Oral Oncology Patient Advocate Encounter   Received notification that the application for assistance for Zejula through [employer] has been approved.   [employer] phone number [phone].   Effective dates: 09/20/22 through 04/22/23  I have spoken to the patient.  [name], CPhT-Adv Oncology Pharmacy Patient Advocate [facility] Direct Number: [phone]  Fax: [phone]   [facility] Pharm[facility] CoverMyMeds phone [phone]

## 2022-09-20 NOTE — Telephone Encounter (Signed)
Oral Chemotherapy Pharmacist Encounter   I spoke with patient today for overview of: Zejula (niraparib) for the maintenance treatment of recurrent fallopian tube cancer with response to platinum-based chemotherapy, planned duration until disease progression or unacceptable toxicity.   Counseled patient on administration, dosing, side effects, monitoring, drug-food interactions, safe handling, storage, and disposal.  Patient will take Zejula 100mg  tablets, 1 tablet (100mg ) by mouth once daily, with a glass of water, without regard to food. Patient is taking a reduced dose due to history of severe pancytopenia.   Zejula start date: as soon as it is delivered (pt will call and let us know when it is delivered so appointments can be adjusted if needed)   Adverse effects include but are not limited to: nausea, vomiting, fatigue, constipation, decreased blood counts, and joint pain. Myelodysplastic syndrome/acute myeloid leukemia (MDS/AML) have been reported (rarely) in clinical trials.  Patient updated about close blood count monitoring at the initiation of Zejula for detection of thrombocytopenia.  Patient has anti-emetic on hand and knows to take it if nausea develops.    Reviewed with patient importance of keeping a medication schedule and plan for any missed doses. No barriers to medication adherence identified.  Medication reconciliation performed and medication/allergy list updated.  All questions answered.  Holly [name] voiced understanding and appreciation.   Medication education handout placed in mail for patient. Patient knows to call the office with questions or concerns. Oral Chemotherapy Clinic phone number provided to patient.   [name], PharmD PGY-2 Pharmacy Resident Hematology/Oncology [phone]  09/20/2022 2:08 PM

## 2022-09-23 ENCOUNTER — Telehealth: Payer: Self-pay

## 2022-09-23 NOTE — Telephone Encounter (Signed)
Called to see if she has heard from the program regarding Zejula Rx. She will call the office back once she hears from them and know delivery date.

## 2022-09-25 ENCOUNTER — Encounter: Payer: Self-pay | Admitting: Hematology and Oncology

## 2022-09-25 NOTE — Telephone Encounter (Signed)
Oral Oncology Pharmacist Encounter  Patient received medication, Zejula, from patient assistance program and started on 09/25/2022.   Bethel Born, PharmD Hematology/Oncology Clinical Pharmacist [facility] Oral Chemotherapy Navigation Clinic [phone]

## 2022-09-30 ENCOUNTER — Ambulatory Visit: Payer: Medicare Other | Admitting: Hematology and Oncology

## 2022-09-30 ENCOUNTER — Other Ambulatory Visit: Payer: Medicare Other

## 2022-10-03 ENCOUNTER — Inpatient Hospital Stay: Payer: Medicare Other

## 2022-10-03 ENCOUNTER — Other Ambulatory Visit: Payer: Self-pay

## 2022-10-03 ENCOUNTER — Encounter: Payer: Self-pay | Admitting: Hematology and Oncology

## 2022-10-03 ENCOUNTER — Inpatient Hospital Stay: Payer: Medicare Other | Attending: Hematology and Oncology | Admitting: Hematology and Oncology

## 2022-10-03 VITALS — BP 137/65 | HR 71 | Resp 18 | Ht 65.0 in | Wt 137.4 lb

## 2022-10-03 DIAGNOSIS — I10 Essential (primary) hypertension: Secondary | ICD-10-CM

## 2022-10-03 DIAGNOSIS — T451X5A Adverse effect of antineoplastic and immunosuppressive drugs, initial encounter: Secondary | ICD-10-CM

## 2022-10-03 DIAGNOSIS — Z7189 Other specified counseling: Secondary | ICD-10-CM

## 2022-10-03 DIAGNOSIS — C5701 Malignant neoplasm of right fallopian tube: Secondary | ICD-10-CM

## 2022-10-03 DIAGNOSIS — D6481 Anemia due to antineoplastic chemotherapy: Secondary | ICD-10-CM

## 2022-10-03 LAB — CBC WITH DIFFERENTIAL/PLATELET
Abs Immature Granulocytes: 0.02 10*3/uL (ref 0.00–0.07)
Basophils Absolute: 0.1 10*3/uL (ref 0.0–0.1)
Basophils Relative: 1 %
Eosinophils Absolute: 0.8 10*3/uL — ABNORMAL HIGH (ref 0.0–0.5)
Eosinophils Relative: 10 %
HCT: 31.9 % — ABNORMAL LOW (ref 36.0–46.0)
Hemoglobin: 10.3 g/dL — ABNORMAL LOW (ref 12.0–15.0)
Immature Granulocytes: 0 %
Lymphocytes Relative: 19 %
Lymphs Abs: 1.5 10*3/uL (ref 0.7–4.0)
MCH: 33.8 pg (ref 26.0–34.0)
MCHC: 32.3 g/dL (ref 30.0–36.0)
MCV: 104.6 fL — ABNORMAL HIGH (ref 80.0–100.0)
Monocytes Absolute: 0.6 10*3/uL (ref 0.1–1.0)
Monocytes Relative: 8 %
Neutro Abs: 5.1 10*3/uL (ref 1.7–7.7)
Neutrophils Relative %: 62 %
Platelets: 196 10*3/uL (ref 150–400)
RBC: 3.05 MIL/uL — ABNORMAL LOW (ref 3.87–5.11)
RDW: 14.4 % (ref 11.5–15.5)
WBC: 8 10*3/uL (ref 4.0–10.5)
nRBC: 0 % (ref 0.0–0.2)

## 2022-10-03 LAB — COMPREHENSIVE METABOLIC PANEL
ALT: 5 U/L (ref 0–44)
AST: 15 U/L (ref 15–41)
Albumin: 3.6 g/dL (ref 3.5–5.0)
Alkaline Phosphatase: 68 U/L (ref 38–126)
Anion gap: 6 (ref 5–15)
BUN: 14 mg/dL (ref 8–23)
CO2: 27 mmol/L (ref 22–32)
Calcium: 9.1 mg/dL (ref 8.9–10.3)
Chloride: 104 mmol/L (ref 98–111)
Creatinine, Ser: 1.12 mg/dL — ABNORMAL HIGH (ref 0.44–1.00)
GFR, Estimated: 54 mL/min — ABNORMAL LOW (ref >60)
Glucose, Bld: 79 mg/dL (ref 70–99)
Potassium: 4.2 mmol/L (ref 3.5–5.1)
Sodium: 137 mmol/L (ref 135–145)
Total Bilirubin: 0.5 mg/dL (ref 0.3–1.2)
Total Protein: 6.3 g/dL — ABNORMAL LOW (ref 6.5–8.1)

## 2022-10-03 MED ORDER — HEPARIN SOD (PORK) LOCK FLUSH 100 UNIT/ML IV SOLN
500.0000 [IU] | Freq: Once | INTRAVENOUS | Status: AC
  Administered 2022-10-03: 500 [IU]

## 2022-10-03 MED ORDER — SODIUM CHLORIDE 0.9% FLUSH
10.0000 mL | Freq: Once | INTRAVENOUS | Status: AC
  Administered 2022-10-03: 10 mL

## 2022-10-03 NOTE — Progress Notes (Signed)
[facility] OFFICE PROGRESS NOTE  Patient Care Team: Default, Provider, MD as PCP - Danella Maiers, MD as Consulting Physician (Hematology and Oncology) Adolphus Birchwood, MD as Consulting Physician (Obstetrics and Gynecology)  ASSESSMENT & PLAN:  Adenocarcinoma of right fallopian tube [facility]) So far, she tolerated Zejula well We discussed future follow-up We discussed side effects to be expected  Essential hypertension Her blood pressure is now improved and borderline high at home She will continue to monitor her blood pressure closely I do not recommend any changes now  Anemia due to antineoplastic chemotherapy This is likely due to recent treatment. The patient denies recent history of bleeding such as epistaxis, hematuria or hematochezia. She is asymptomatic from the anemia. I will observe for now.  She does not require transfusion now. I will continue the chemotherapy at current dose without dosage adjustment.  If the anemia gets progressive worse in the future, I might have to delay her treatment or adjust the chemotherapy dose.   No orders of the defined types were placed in this encounter.   All questions were answered. The patient knows to call the clinic with any problems, questions or concerns. The total time spent in the appointment was 25 minutes encounter with patients including review of chart and various tests results, discussions about plan of care and coordination of care plan   Artis Delay, MD 10/03/2022 3:11 PM  INTERVAL HISTORY: Please see below for problem oriented charting. she returns for treatment follow-up with her husband She tolerated Zejula well with no side effects Her documented blood pressures at home were within normal range with occasional high numbers She is not symptomatic  REVIEW OF SYSTEMS:   Constitutional: Denies fevers, chills or abnormal weight loss Eyes: Denies blurriness of vision Ears, nose, mouth, throat, and face:  Denies mucositis or sore throat Respiratory: Denies cough, dyspnea or wheezes Cardiovascular: Denies palpitation, chest discomfort or lower extremity swelling Gastrointestinal:  Denies nausea, heartburn or change in bowel habits Skin: Denies abnormal skin rashes Lymphatics: Denies new lymphadenopathy or easy bruising Neurological:Denies numbness, tingling or new weaknesses Behavioral/Psych: Mood is stable, no new changes  All other systems were reviewed with the patient and are negative.  I have reviewed the past medical history, past surgical history, social history and family history with the patient and they are unchanged from previous note.  ALLERGIES:  is allergic to dilaudid [hydromorphone hcl], morphine and codeine, and sudafed [pseudoephedrine hcl].  MEDICATIONS:  Current Outpatient Medications  Medication Sig Dispense Refill   ALAWAY 0.035 % ophthalmic solution Place 1 drop into both eyes 2 (two) times daily as needed (for irritation).     atenolol (TENORMIN) 25 MG tablet Take 1 tablet (25 mg total) by mouth daily. 30 tablet 2   cloNIDine (CATAPRES) 0.1 MG tablet TAKE 1 TABLET BY MOUTH EVERY DAY 30 tablet 1   docusate sodium (COLACE) 100 MG capsule Take 100 mg by mouth daily as needed for mild constipation.     doxazosin (CARDURA) 4 MG tablet TAKE 1 TABLET BY MOUTH EVERY DAY 30 tablet 1   lidocaine-prilocaine (EMLA) cream Apply to affected area once daily as directed. (Patient taking differently: 1 application  daily as needed (as directed- to access port).) 30 g 3   lisinopril (ZESTRIL) 20 MG tablet TAKE 1 TABLET BY MOUTH TWICE A DAY 60 tablet 1   MIRALAX 17 GM/SCOOP powder Take 17 g by mouth daily as needed for mild constipation.     niraparib tosylate (  ZEJULA) 100 MG tablet Take 1 tablet (100 mg total) by mouth daily. May take at bedtime to reduce nausea and vomiting. 30 tablet 3   ondansetron (ZOFRAN) 8 MG tablet Take 1 tablet (8 mg total) by mouth every 8 (eight) hours as  needed for nausea or vomiting. Start on the third day after chemotherapy. 30 tablet 1   prochlorperazine (COMPAZINE) 10 MG tablet Take 1 tablet (10 mg total) by mouth every 6 (six) hours as needed for nausea or vomiting. 30 tablet 1   sodium chloride (OCEAN) 0.65 % SOLN nasal spray Place 1 spray into both nostrils as needed for congestion.     TYLENOL 8 HOUR ARTHRITIS PAIN 650 MG CR tablet Take 650-1,300 mg by mouth every 8 (eight) hours as needed for pain.     venlafaxine XR (EFFEXOR-XR) 37.5 MG 24 hr capsule TAKE 1 CAPSULE BY MOUTH DAILY WITH BREAKFAST. (Patient taking differently: Take 37.5 mg by mouth daily with breakfast.) 90 capsule 3   No current facility-administered medications for this visit.   Facility-Administered Medications Ordered in Other Visits  Medication Dose Route Frequency Provider Last Rate Last Admin   sodium chloride flush (NS) 0.9 % injection 10 mL  10 mL Intravenous PRN Bertis Ruddy, Candis Kabel, MD   10 mL at 02/11/17 0839   sodium chloride flush (NS) 0.9 % injection 10 mL  10 mL Intravenous PRN Bertis Ruddy, Braidyn Scorsone, MD   10 mL at 03/04/17 1510    SUMMARY OF ONCOLOGIC HISTORY: Oncology History Overview Note  Neg genetics High grade serous    Adenocarcinoma of right fallopian tube (HCC)  08/12/2016 Initial Diagnosis   The patient has many months of vague symptoms of fullness in the pelvis, urinary frequency and difficulty with defecation. She denies vaginal bleeding or rectal bleeding.     08/12/2016 Imaging   Abdomen X-ray in the ER: Moderate colonic stool burden without evidence of enteric obstruction   11/13/2016 Imaging   TVUS was performed on 11/13/16 which showed a large hypoechoic lobular mass seen posterior to the uterus measuring 18.2x16.1x11.8cm, blood flow was seen along the periphery of the mass. It was considered to be likely to be a fibroid vs pelvic mass vs ovarian mass. Neither ovary was removed. Moderate amount of free fluid in the pelvis. THe uterus measures 4x10x4cm.  The endometrium is 4mm.    12/05/2016 Imaging   MR pelvis 1. Mild limitations as detailed above. 2. Heterogeneous pelvic mass is favored to arise from the right ovary. Favor a solid ovarian neoplasm such as fibroma/Brenner's tumor. Given lesion size and heterogeneous T2 signal out, complicating torsion cannot be excluded. 3. Moderate abdominopelvic ascites, without specific evidence of peritoneal metastasis. Consider further evaluation with contrast-enhanced abdominopelvic CT.    12/26/2016 Pathology Results   1. Uterus, ovaries and fallopian tubes - ADENOCARCINOMA INVOLVING RIGHT FALLOPIAN TUBE, RIGHT AND LEFT OVARIES, UTERINE SEROSA AND PELVIC MASS. - SEE ONCOLOGY TABLE AND COMMENT. - UTERINE CERVIX, ENDOMETRIUM, MYOMETRIUM AND LEFT FALLOPIAN TUBE FREE OF TUMOR. 2. Omentum, resection for tumor - ADENOCARCINOMA. Microscopic Comment 1. ONCOLOGY TABLE - FALLOPIAN TUBE. SEE COMMENT 1. Specimen, including laterality: Uterus, bilateral adnexa, pelvic mass and omentum. 2. Procedure: Hysterectomy with bilateral salpingo-oophorectomy, pelvic mass excision and omentectomy. 3. Lymph node sampling performed: No 4. Tumor site: See comment. 5. Tumor location in fallopian tube: Right fallopian tube fimbria 6. Specimen integrity (intact/ruptured/disrupted): Intact 7. Tumor size (cm): 18 cm, see comment. 8. Histologic type: Adenocarcinoma 9. Grade: High grade 10. Microscopic tumor extension: Tumor involves  right fallopian tube, right and left ovaries, uterine serosa, pelvic mass and omentum. 11. Margins: See comment. 12. Lymph-Vascular invasion: Present 13. Lymph nodes: # examined: 0; # positive: N/A 14. TNM: pT3c, pNX 15. FIGO Stage (based on pathologic findings, needs clinical correlation: III-C 16. Comments: There is an 18 cm pelvic mass which is a high grade adenocarcinoma and there is a 12.2 cm  segment of omentum which is extensively involved with adenocarcinoma. The tumor also involves the  fimbria of the right fallopian tube and the parenchyma of the right and left ovaries as well as uterine serosa. The fimbria of the right fallopian has intraepithelial atypia consistent with a precursor lesion and therefore this is most consistent with primary fallopian tube adenocarcinoma. A primary peritoneal serous carcinoma is a less likely possibility.    12/26/2016 Pathology Results   PERITONEAL/ASCITIC FLUID(SPECIMEN 1 OF 1 COLLECTED 12/26/16): POORLY DIFFERENTIATED ADENOCARCINOMA   12/26/2016 Surgery   Procedure(s) Performed: Exploratory laparotomy with total abdominal hysterectomy, bilateral salpingo-oophorectomy, omentectomy radical tumor debulking for ovarian cancer .   Surgeon: Luisa Dago, MD.      Operative Findings: 20cm left ovarian mass densely adherent to the posterior uterus, right tube and ovary, cervix, sigmoid colon. 10cm omental cake. 4L ascites. 1mm size tumor nodules on the serosa of the terminal ileum and proximal sigmoid colon. 1mm nodules on right diaphragm.    This represented an optimal cytoreduction (R1) with 1mm nodules on intestine and diaphragm representing gross visible disease   01/16/2017 Procedure   Placement of single lumen port a cath via right internal jugular vein. The catheter tip lies at the cavo-atrial junction. A power injectable port a cath was placed and is ready for immediate use.   01/17/2017 Imaging   1. 3.5 x 7.2 x 4.6 cm fluid collection along the vaginal cuff, posterior the bladder and extending into the right adnexal space. This lesion demonstrates rim enhancement. Imaging features could be related to a loculated postoperative seroma or hematoma. Superinfection cannot be excluded by CT. Given debulking surgery was 3 weeks ago, this entire structure is un likely to represent neoplasm, but peritoneal involvement could have this appearance. 2. Small fluid collection in the left para colic gutter without rim enhancement. 3. Irregular/nodular appearance  of the peritoneal M in the anatomic pelvis with areas of subtle nodularity between the stomach and the spleen. Close attention in these regions on follow-up recommended as metastatic disease is a concern. 4. Mildly enlarged hepatoduodenal ligament lymph node. Attention on follow-up recommended.   01/20/2017 Tumor Marker   Patient's tumor was tested for the following markers: CA-125 Results of the tumor marker test revealed 46.6   01/28/2017 Genetic Testing       Negative genetic testing on the [facility] panel.  The [facility] gene panel offered by [employer] includes sequencing and deletion/duplication testing of the following 28 genes: APC, ATM, BARD1, BMPR1A, BRCA1, BRCA2, BRIP1, CHD1, CDK4, CDKN2A, CHEK2, EPCAM (large rearrangement only), MLH1, MSH2, MSH6, MUTYH, NBN, PALB2, PMS2, PTEN, RAD51C, RAD51D, SMAD4, STK11, and TP53. Sequencing was performed for select regions of POLE and POLD1, and large rearrangement analysis was performed for select regions of GREM1. The report date is January 28, 2017.  HRD testing looking for genomic instability and BRCA mutations was negative.  The report date of this test is January 27, 2017.    01/31/2017 Tumor Marker   Patient's tumor was tested for the following markers: CA-125 Results of the tumor marker test revealed 22.4  03/04/2017 Tumor Marker   Patient's tumor was tested for the following markers: CA-125 Results of the tumor marker test revealed 13.8   04/18/2017 Tumor Marker   Patient's tumor was tested for the following markers: CA-125 Results of the tumor marker test revealed 11.9   05/05/2017 Tumor Marker   Patient's tumor was tested for the following markers: CA-125 Results of the tumor marker test revealed 12   05/26/2017 Tumor Marker   Patient's tumor was tested for the following markers: CA-125 Results of the tumor marker test revealed 10.6   05/26/2017 Imaging   1. A previously noted postoperative fluid collection in  the low pelvis and residual ascites seen on the prior examination has resolved on today's study. No findings to suggest residual/recurrent disease on today's examination. No definite solid organ metastasis identified in the abdomen or pelvis. No lymphadenopathy. 2. Aortic atherosclerosis.   06/10/2017 Procedure   Successful right IJ vein Port-A-Cath explant.   03/09/2018 Tumor Marker   Patient's tumor was tested for the following markers: CA-125 Results of the tumor marker test revealed 12.1   05/26/2018 Tumor Marker   Patient's tumor was tested for the following markers: CA-125 Results of the tumor marker test revealed 13.8   09/09/2018 Tumor Marker   Patient's tumor was tested for the following markers: CA-125 Results of the tumor marker test revealed 23.9   12/18/2018 Tumor Marker   Patient's tumor was tested for the following markers: CA-125 Results of the tumor marker test revealed 43.8   02/27/2019 - 02/28/2019 Hospital Admission   She was admitted for bowel obstruction   02/27/2019 Imaging   1. High-grade small bowel obstruction with transition point in the pelvis in an area of suspected desmoplastic reaction surrounding a 1.3 cm spiculated nodule in the mesentery, suspicious for carcinoid. There is hyperenhancement near the tip of the appendix and loss of the normal fat plane between it and the adjacent sigmoid colon, potentially the site of primary lesion. 2. Multiple new small subcentimeter hyperdense lesions scattered along the liver capsule, concerning for metastases. Enlarged hyperenhancing gastrohepatic and portacaval lymph nodes concerning for nodal metastases. 3. Very mild right hydroureteronephrosis to the level of the desmoplastic reaction in the pelvis, potentially involving the right ureter. 4. Infraumbilical ventral abdominal wall diastasis containing a small nondilated portion of transverse colon. 5. Trace ascites. 6. Cholelithiasis. 7.  Aortic atherosclerosis  (ICD10-I70.0).     03/05/2019 Tumor Marker   Patient's tumor was tested for the following markers: CA-125 Results of the tumor marker test revealed 70.1   03/12/2019 Echocardiogram   IMPRESSIONS     1. Left ventricular ejection fraction, by visual estimation, is 60 to 65%. The left ventricle has normal function. There is no left ventricular hypertrophy.  2. Left ventricular diastolic parameters are consistent with Grade I diastolic dysfunction (impaired relaxation).  3. Global right ventricle has normal systolic function.The right ventricular size is normal. No increase in right ventricular wall thickness.  4. Left atrial size was normal.  5. Right atrial size was normal.  6. The pericardium was not assessed.  7. The mitral valve is normal in structure. No evidence of mitral valve regurgitation.  8. The tricuspid valve is normal in structure. Tricuspid valve regurgitation is trivial.  9. The aortic valve is normal in structure. Aortic valve regurgitation is not visualized. 10. The pulmonic valve was grossly normal. Pulmonic valve regurgitation is not visualized. 11. The average left ventricular global longitudinal strain is -17.6 %.   03/16/2019 Procedure  Successful placement of a right internal jugular approach power injectable Port-A-Cath. The catheter is ready for immediate use.     04/19/2019 Tumor Marker   Patient's tumor was tested for the following markers: CA-125 Results of the tumor marker test revealed 39.8   05/17/2019 Tumor Marker   Patient's tumor was tested for the following markers: CA-125 Results of the tumor marker test revealed 27   05/28/2019 Tumor Marker   Patient's tumor was tested for the following markers: CA-125 Results of the tumor marker test revealed 27.7.   06/11/2019 Imaging   1. No new or progressive metastatic disease in the abdomen or pelvis. 2. Tiny noncalcified perisplenic implant is decreased. Calcified pericardiophrenic, retroperitoneal and  bilateral inguinal lymph nodes and scattered small calcified perihepatic and perisplenic implants are stable. 3.  Aortic Atherosclerosis (ICD10-I70.0).       06/17/2019 Echocardiogram    1. Left ventricular ejection fraction, by estimation, is 65 to 70%. The left ventricle has normal function. The left ventricle has no regional wall motion abnormalities. Left ventricular diastolic parameters were normal.  2. Right ventricular systolic function is normal. The right ventricular size is normal.  3. The mitral valve is normal in structure and function. No evidence of mitral valve regurgitation. No evidence of mitral stenosis.  4. The aortic valve is normal in structure and function. Aortic valve regurgitation is not visualized. No aortic stenosis is present.   06/21/2019 Tumor Marker   Patient's tumor was tested for the following markers: CA-125 Results of the tumor marker test revealed 18.7   07/19/2019 Tumor Marker   Patient's tumor was tested for the following markers: CA-125 Results of the tumor marker test revealed 16.7   08/30/2019 Tumor Marker   Patient's tumor was tested for the following markers: CA-125. Results of the tumor marker test revealed 13.9   09/15/2019 Echocardiogram    1. Left ventricular ejection fraction, by estimation, is 65 to 70%. The left ventricle has normal function. The left ventricle has no regional wall motion abnormalities. Left ventricular diastolic parameters are consistent with Grade I diastolic dysfunction (impaired relaxation). The average left ventricular global longitudinal strain is 18.1 %. The global longitudinal strain is normal.  2. Right ventricular systolic function is normal. The right ventricular size is normal.  3. The mitral valve is normal in structure. No evidence of mitral valve regurgitation. No evidence of mitral stenosis.  4. The aortic valve is normal in structure. Aortic valve regurgitation is not visualized. No aortic stenosis is present.   5. The inferior vena cava is normal in size with greater than 50% respiratory variability, suggesting right atrial pressure of 3 mmHg.     09/24/2019 Imaging   1. Stable exam. No new or progressive metastatic disease on today's study. 2. Small subcapsular hypodensity in the lateral spleen, new in the interval, but indeterminate. Attention on follow-up recommended. 3. The calcified upper abdominal and groin lymph nodes are stable as are the scattered calcified and noncalcified peritoneal implants. 4. Stable midline ventral hernia containing a short segment of colon without complicating features. 5. Aortic Atherosclerosis (ICD10-I70.0).     10/04/2019 - 09/21/2021 Chemotherapy   Patient is on Treatment Plan : ovarian Bevacizumab q21d     10/04/2019 Tumor Marker   Patient's tumor was tested for the following markers: CA-125. Results of the tumor marker test revealed 12.9.   11/01/2019 Tumor Marker   Patient's tumor was tested for the following markers: CA-125 Results of the tumor marker test revealed 15.4  12/13/2019 Tumor Marker   Patient's tumor was tested for the following markers: CA-125 Results of the tumor marker test revealed 14.8   12/31/2019 Imaging   1. Unchanged tiny peritoneal nodule adjacent to the spleen measuring 6 mm. Other previously noted peritoneal nodules are imperceptible on present examination. 2. Unchanged prominent, partially calcified portacaval lymph node and bilateral inguinal lymph nodes. 3. Unchanged subtle, nonspecific subcapsular lesion of the spleen.  4. No evidence of new metastatic disease in the abdomen or pelvis. 5. Status post hysterectomy. 6. Unchanged low midline ventral abdominal hernia containing a single loop of nonobstructed transverse colon. 7. Cholelithiasis. 8. Aortic Atherosclerosis (ICD10-I70.0).       01/03/2020 Tumor Marker   Patient's tumor was tested for the following markers: CA-125 Results of the tumor marker test revealed 11.7.    01/24/2020 Tumor Marker   Patient's tumor was tested for the following markers: CA-125. Results of the tumor marker test revealed 15.1   03/06/2020 Tumor Marker   Patient's tumor was tested for the following markers: CA-125. Results of the tumor marker test revealed 20.3   03/27/2020 Tumor Marker   Patient's tumor was tested for the following markers: CA-125 Results of the tumor marker test revealed 23.7   05/11/2020 Tumor Marker   Patient's tumor was tested for the following markers: CA-125. Results of the tumor marker test revealed 25.4   06/23/2020 Imaging   1. Cholelithiasis with gallbladder wall thickening and mild infiltrative edema in the porta hepatis, raising the possibility of acute cholecystitis. There is truncation of the common bile duct distally and distal choledocholithiasis is difficult to exclude. Correlate with bilirubin levels. If clinically warranted, MRCP could be utilized for further characterization. 2. Stable tiny perisplenic nodule. 3. Other imaging findings of potential clinical significance: Small type 1 hiatal hernia. Prominent stool throughout the colon favors constipation. Ventral infraumbilical hernia contains transverse colon without findings of strangulation or obstruction. Small supraumbilical hernia contains adipose tissue. Lumbar degenerative disc disease at L5-S1. Stable tiny nodule along the lateral margin of the spleen, nonspecific. 4. Aortic atherosclerosis.     12/15/2020 Imaging   No evidence of recurrent or metastatic carcinoma within the abdomen or pelvis.   Stable ventral hernia containing transverse colon. No evidence of bowel obstruction or strangulation.   Cholelithiasis. No radiographic evidence of cholecystitis.   Tiny hiatal hernia.   Aortic Atherosclerosis (ICD10-I70.0).     06/11/2021 Imaging   1. Numerous sub 4 mm pulmonary nodules in the lungs some of which have a cavitary appearance. I think it is unlikely this is metastatic  disease and more likely a possible immunotherapy related process such as a sarcoid like reaction. Recommend full chest CT further evaluation. 2. Stable small calcified retroperitoneal and mesenteric lymph nodes. No findings suspicious for recurrent peritoneal carcinomatosis. 3. Stable lower abdominal wall hernia containing part of the transverse colon. 4. Cholelithiasis.   09/27/2021 Imaging   1. Numerous small pulmonary nodules throughout the lungs, the majority of which are cavitary. These are all subcentimeter, however nodules that were seen in the bilateral lung bases on prior CT dated 06/08/2021 appear slightly increased in size and number. Behavior is highly suspicious for pulmonary metastatic disease despite the unusual appearance, however as previously reported general differential considerations include atypical infection and inflammation, such as drug-induced sarcoidosis like reaction.  2. Unchanged calcified lymph nodes in the abdomen and pelvis, consistent with treated nodal metastases. 3. Cholelithiasis with wall thickening and pericholecystic fluid. This appearance is similar to prior examination and  suggests chronic cholecystitis. Correlate for clinical symptoms. Mild intrahepatic biliary ductal dilatation, similar to prior examination. 4. Midline ventral hernia containing a single nonobstructed loop of mid transverse colon.     12/25/2021 Imaging   1. Again noted are multiple thin walled cavitary lung nodules throughout both lungs. These are similar in size and multiplicity when compared with the exam from 09/26/2021. Etiology remains indeterminate, although metastatic disease cannot be excluded with a high degree of certainty. 2. Stable calcified upper abdominal lymph nodes consistent with treated nodal metastases. 3. Gallstones. 4. Aortic Atherosclerosis (ICD10-I70.0).   01/30/2022 Tumor Marker   Patient's tumor was tested for the following markers: CA-125. Results of the tumor  marker test revealed 59.3.   01/31/2022 Imaging   1. No substantial interval change in exam. 2. Innumerable tiny cavitary pulmonary nodules in the lung bases. As noted previously, metastatic disease would be distinct consideration although imaging features are nonspecific and infectious/inflammatory etiology is not excluded. 3. Calcified nodal disease in the upper abdomen and pelvis shows no substantial interval change. Scattered noncalcified nodules in the mesentery are similar to prior. 4. Markedly large stool volume throughout the colon. Imaging features suggestive of clinical constipation. 5. Cholelithiasis with ill-defined appearance of the gallbladder wall. Appearance is stable in the interval. 6. Trace free fluid in the cul-de-sac. 7. Midline ventral hernia contains a short segment of transverse colon without complicating features. 8. Aortic Atherosclerosis (ICD10-I70.0).   03/07/2022 Surgery   Procedure Date:  03/07/2022  Pre-operative Diagnosis:  Symptomatic cholelithiasis   Post-operative Diagnosis: Symptomatic cholelithiasis, peritoneal implants with history of right fallopian tube cancer.   Procedure:   Robotic assisted cholecystectomy with ICG FireFly cholangiogram Robotic assisted abdominal wall peritoneal implant excisional biopsy x 3.   Surgeon:  Howie Ill, MD  Specimens:   Gallbladder Peritoneal implants x 3    Indications for Procedure:  This is a 67 y.o. female who presents with abdominal pain and workup revealing symptomatic cholelithiasis.  The benefits, complications, treatment options, and expected outcomes were discussed with the patient. The risks of bleeding, infection, recurrence of symptoms, failure to resolve symptoms, bile duct damage, bile duct leak, retained common bile duct stone, bowel injury, and need for further procedures were all discussed with the patient and she was willing to proceed.     03/07/2022 Pathology Results   SURGICAL  PATHOLOGY  CASE: [phone]  PATIENT: Edwin Cap  Surgical Pathology Report   Specimen Submitted:  A. Gallbladder  B. Abdominal wall   Clinical History: Symptomatic cholelithiasis   DIAGNOSIS:  A. GALLBLADDER; CHOLECYSTECTOMY:  - CHRONIC CHOLECYSTITIS WITH CHOLELITHIASIS.  - NEGATIVE FOR DYSPLASIA AND MALIGNANCY.  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   B. SOFT TISSUE, ABDOMINAL WALL; BIOPSY:  - POSITIVE FOR MALIGNANCY.  - METASTATIC CARCINOMA, COMPATIBLE WITH HIGH-GRADE SEROUS CARCINOMA OF GYNECOLOGIC PRIMARY.   Comment:  Immunohistochemical studies were performed on neoplastic cells in both sources. Tumor cells in both are positive for CK7, Pax-8, and WT-1, supporting the above diagnosis. P53 demonstrates strong and diffuse expression, indicative of mutated expression pattern.   There is sufficient tissue present for ancillary molecular testing if desired.    04/04/2022 Imaging   1. Mild progression of partially calcified peritoneal metastatic implants surrounding the liver, in the porta hepatis and in the periumbilical region. No generalized ascites or peritoneal nodularity status post omentectomy. 2. Grossly stable cavitary lesions at both lung bases, presumably reflecting metastatic disease. 3. No  other evidence of progressive metastatic disease within the abdomen or pelvis. 4. Enlarging ventral hernia containing a portion of the transverse colon. No evidence of bowel obstruction or inflammation. 5.  Aortic Atherosclerosis (ICD10-I70.0).   04/08/2022 - 08/31/2022 Chemotherapy   Patient is on Treatment Plan : OVARIAN Carboplatin + Paclitaxel + Bevacizumab q21d      04/09/2022 Tumor Marker   Patient's tumor was tested for the following markers: CA-125. Results of the tumor marker test revealed 86.7.   05/22/2022 Tumor Marker   Patient's tumor was tested for the following markers:  CA-125. Results of the tumor marker test revealed 49.7.   06/05/2022 Imaging   1. New tiny bilateral pleural effusions. 2. Once again there are several small cavitary lesions in the lungs. Many of these are becoming more cavitary with less soft tissue elements and are smaller today. 3. Soft tissue thickening and nodularity along the surface of the liver appears to be overall decreasing from previous examination. Some of these areas have calcification. 4. Mesenteric nodules as well as retroperitoneal and mesenteric nodes appear similar to slightly improved. The inguinal nodes seen previously are similar. 5. No developing new mass lesion, ascites or lymph node enlargement seen. 6. Of interest the density of the lumen of the renal pelvis bilaterally is increased on the left compared to the right in the portal venous phase. This is of uncertain etiology and significance. Please correlate with the urinalysis such as for hematuria.   09/02/2022 Tumor Marker   Patient's tumor was tested for the following markers: CA-125. Results of the tumor marker test revealed 38.3.   09/12/2022 Imaging   CT CHEST ABDOMEN PELVIS W CONTRAST  Result Date: 09/12/2022 CLINICAL DATA:  Assess treatment response ovarian cancer * Tracking Code: BO * EXAM: CT CHEST, ABDOMEN, AND PELVIS WITH CONTRAST TECHNIQUE: Multidetector CT imaging of the chest, abdomen and pelvis was performed following the standard protocol during bolus administration of intravenous contrast. RADIATION DOSE REDUCTION: This exam was performed according to the departmental dose-optimization program which includes automated exposure control, adjustment of the mA and/or kV according to patient size and/or use of iterative reconstruction technique. CONTRAST:  OMNIPAQUE IOHEXOL 300 MG/ML  SOLN COMPARISON:  Abdomen pelvis CT 06/05/2022 and older. Chest CT 12/25/2021 and older. FINDINGS: CT CHEST FINDINGS Cardiovascular: Right upper chest port. Heart is  nonenlarged. No pericardial effusion. Thoracic aorta has a normal course and caliber with mild atherosclerotic plaque. Mediastinum/Nodes: Stable thyroid gland. Slightly patulous thoracic esophagus with small hiatal hernia. No specific abnormal lymph node enlargement identified including axillary regions, hilum or mediastinum. No thoracic inlet abnormal nodes. One of the lymph nodes in the left axilla has some calcification, unchanged from previous. Lungs/Pleura: Lungs are without consolidation, pneumothorax or effusion. Few areas of peripheral interstitial septal thickening, scarring and fibrotic change. Previously there were numerous cavitary nodules in both lungs. The extent and distribution appears overall decreasing. The residual cavitary lesions have thinner walls than previous from September 2023. Specific lesions will be followed for continuity and compared in measurement to that exam. The nodules that are seen on the more recent study of the abdomen and pelvis from February 2024 also show improvement and wall thickness. Right upper lobe focus laterally which previously measured 6 mm in diameter, today is measured larger at 7 mm but again has a much thinner wall. Left upper lobe focus which previously has a diameter of 6 mm, today on series 4, image 53 has a diameter of 7 mm. Again much thinner  wall. Less nodular. Focus right lower lobe laterally and posteriorly which previously measured 6 mm, today on series 4, image 116 measures 5 mm. Wall is thinner. And lastly nodule middle lobe posteriorly which measured 3 mm on the prior, today on series 4, image 76 is smaller at proximally 1-2 mm. Musculoskeletal: Mild curvature of the spine. Scattered degenerative changes. CT ABDOMEN PELVIS FINDINGS Hepatobiliary: Diffuse fatty liver infiltration. No space-occupying liver lesion. Patent portal vein. Previous cholecystectomy. Once again there is some thickening and calcification along falciform ligament which is  similar on today's examination, series 2, image 52 for example. The nodule anterior to the left hepatic lobe towards the dome and margin of the diaphragm which previously measured 13 x 7 mm, today has some residual tissue measuring 10 x 4 mm. Additional focus posterior to segment 7 in the right hepatic lobe and laterally of also improved. Less thickening of the rind of soft tissue along the surface of the liver laterally. Pancreas: Global pancreatic atrophy without mass. Spleen: Preserved enhancement. Spleen previously was mildly enlarged at 13.1 cm cephalocaudal length and today measured at 12.7 cm. Adrenals/Urinary Tract: Adrenal glands are preserved. No enhancing renal mass or collecting system dilatation. Extrarenal pelvis seen to both kidneys. Stable Bosniak 1 exophytic left-sided renal cystic lesion. No specific imaging follow-up. The ureters have normal course and caliber down to the bladder. Preserved contours of the urinary bladder. Stomach/Bowel: Large amount of diffuse colonic stool. Focal caliber change in the course of the colon along the proximal sigmoid colon, nonspecific. Stomach and small bowel are nondilated. Small hiatal hernia. Once again there is rectus muscle diastasis with protuberance of bowel loops and fat in this location at the level of the upper pelvis. Again no signs of obstruction. Vascular/Lymphatic: Scattered vascular calcifications. Normal caliber aorta and IVC. Of interest best seen on coronal imaging there is a slightly tortuous course of the portal vein with what may be a thin stricture or web as seen on sagittal series 5, image 68. In retrospect this was present and has a uncertain etiology and significance. Several prominent lymph nodes are again identified. Specific lesions will be followed. Upper retroperitoneal nodes of the celiac axis are again seen. Example which previously measured 12 x 12 mm with some calcification, today on series 2, image 56 measures 9 by 10 mm. Lesion  just caudal to this which previously measured 19 x 10 mm, today measures 15 by 10 mm on series 2, image 57. Right-sided central mesenteric lymph node which previously measured 14 x 9 mm, today measures 10 x 5 mm on series 2, image 71. Central pelvic mesenteric node which previously measured 13 x 9 mm, today on series 2 image 94 measures 12 x 7 mm. This has a fatty hilum. Bilateral inguinal nodes with calcifications are seen. Left-sided focus previously measured 16 x 14 mm and today 13 x 11 mm. Right-sided inguinal focus which previously measured 17 x 14 mm, today series 2, image 115 measures 15 by 12 mm. Reproductive: Status post hysterectomy. No adnexal masses. Other: No free air or free fluid. The mesenteric cystic focus along the left side on the prior examination is not well seen today. Musculoskeletal: Curvature of the spine. Scattered degenerative changes of the spine and pelvis. IMPRESSION: Overall interval improvement. Decreasing surface nodularity and thickening along the liver. Decreasing lymph nodes overall. No new ascites or lymph node enlargement. Previous pleural effusions no longer identified from the study of February 2024. Several cavitary lesions identified throughout the  lungs continue to show decreasing wall thickening and nodularity. Some of the lesions measured larger than the study of September 2023 but this could be evolution of treatment with decreasing soft tissue components Fatty liver infiltration.  Borderline spleen size. Large amount of colonic stool Electronically Signed   By: Karen Kays M.D.   On: 09/12/2022 09:56      09/25/2022 -  Chemotherapy    She started taking zejula     PHYSICAL EXAMINATION: ECOG PERFORMANCE STATUS: 0 - Asymptomatic  Vitals:   10/03/22 1456  BP: 137/65  Pulse: 71  Resp: 18  SpO2: 99%   Filed Weights   10/03/22 1456  Weight: 137 lb 6.4 oz (62.3 kg)    GENERAL:alert, no distress and comfortable NEURO: alert & oriented x 3 with fluent  speech, no focal motor/sensory deficits  LABORATORY DATA:  I have reviewed the data as listed    Component Value Date/Time   NA 135 08/29/2022 0754   NA 139 04/14/2017 0742   K 4.6 08/29/2022 0754   K 4.0 04/14/2017 0742   CL 102 08/29/2022 0754   CO2 24 08/29/2022 0754   CO2 21 (L) 04/14/2017 0742   GLUCOSE 255 (H) 08/29/2022 0754   GLUCOSE 247 (H) 04/14/2017 0742   BUN 19 08/29/2022 0754   BUN 13.1 04/14/2017 0742   CREATININE 0.83 08/29/2022 0754   CREATININE 0.9 04/14/2017 0742   CALCIUM 8.9 08/29/2022 0754   CALCIUM 9.2 04/14/2017 0742   PROT 6.6 08/29/2022 0754   PROT 7.3 04/14/2017 0742   ALBUMIN 3.8 08/29/2022 0754   ALBUMIN 3.9 04/14/2017 0742   AST 13 (L) 08/29/2022 0754   AST 17 04/14/2017 0742   ALT 6 08/29/2022 0754   ALT 14 04/14/2017 0742   ALKPHOS 67 08/29/2022 0754   ALKPHOS 70 04/14/2017 0742   BILITOT 0.4 08/29/2022 0754   BILITOT 0.37 04/14/2017 0742   GFRNONAA >60 08/29/2022 0754   GFRAA >60 01/24/2020 0924   GFRAA >60 07/19/2019 0951    No results found for: "SPEP", "UPEP"  Lab Results  Component Value Date   WBC 8.0 10/03/2022   NEUTROABS 5.1 10/03/2022   HGB 10.3 (L) 10/03/2022   HCT 31.9 (L) 10/03/2022   MCV 104.6 (H) 10/03/2022   PLT 196 10/03/2022      Chemistry      Component Value Date/Time   NA 135 08/29/2022 0754   NA 139 04/14/2017 0742   K 4.6 08/29/2022 0754   K 4.0 04/14/2017 0742   CL 102 08/29/2022 0754   CO2 24 08/29/2022 0754   CO2 21 (L) 04/14/2017 0742   BUN 19 08/29/2022 0754   BUN 13.1 04/14/2017 0742   CREATININE 0.83 08/29/2022 0754   CREATININE 0.9 04/14/2017 0742      Component Value Date/Time   CALCIUM 8.9 08/29/2022 0754   CALCIUM 9.2 04/14/2017 0742   ALKPHOS 67 08/29/2022 0754   ALKPHOS 70 04/14/2017 0742   AST 13 (L) 08/29/2022 0754   AST 17 04/14/2017 0742   ALT 6 08/29/2022 0754   ALT 14 04/14/2017 0742   BILITOT 0.4 08/29/2022 0754   BILITOT 0.37 04/14/2017 0742

## 2022-10-03 NOTE — Assessment & Plan Note (Signed)
Her blood pressure is now improved and borderline high at home She will continue to monitor her blood pressure closely I do not recommend any changes now

## 2022-10-03 NOTE — Assessment & Plan Note (Signed)
So far, she tolerated Zejula well We discussed future follow-up We discussed side effects to be expected

## 2022-10-03 NOTE — Assessment & Plan Note (Signed)

## 2022-10-04 LAB — CA 125: Cancer Antigen (CA) 125: 23.9 U/mL (ref 0.0–38.1)

## 2022-10-15 ENCOUNTER — Encounter: Payer: Self-pay | Admitting: Hematology and Oncology

## 2022-10-15 ENCOUNTER — Inpatient Hospital Stay (HOSPITAL_BASED_OUTPATIENT_CLINIC_OR_DEPARTMENT_OTHER): Payer: Medicare Other | Admitting: Hematology and Oncology

## 2022-10-15 ENCOUNTER — Inpatient Hospital Stay: Payer: Medicare Other

## 2022-10-15 ENCOUNTER — Other Ambulatory Visit: Payer: Self-pay

## 2022-10-15 VITALS — BP 150/59 | HR 63 | Resp 18 | Ht 65.0 in | Wt 138.4 lb

## 2022-10-15 DIAGNOSIS — C5701 Malignant neoplasm of right fallopian tube: Secondary | ICD-10-CM | POA: Diagnosis not present

## 2022-10-15 DIAGNOSIS — D61818 Other pancytopenia: Secondary | ICD-10-CM

## 2022-10-15 DIAGNOSIS — I10 Essential (primary) hypertension: Secondary | ICD-10-CM | POA: Diagnosis not present

## 2022-10-15 LAB — CBC WITH DIFFERENTIAL/PLATELET
Abs Immature Granulocytes: 0.01 10*3/uL (ref 0.00–0.07)
Basophils Absolute: 0 10*3/uL (ref 0.0–0.1)
Basophils Relative: 1 %
Eosinophils Absolute: 0.4 10*3/uL (ref 0.0–0.5)
Eosinophils Relative: 10 %
HCT: 33.4 % — ABNORMAL LOW (ref 36.0–46.0)
Hemoglobin: 10.9 g/dL — ABNORMAL LOW (ref 12.0–15.0)
Immature Granulocytes: 0 %
Lymphocytes Relative: 23 %
Lymphs Abs: 1 10*3/uL (ref 0.7–4.0)
MCH: 34.3 pg — ABNORMAL HIGH (ref 26.0–34.0)
MCHC: 32.6 g/dL (ref 30.0–36.0)
MCV: 105 fL — ABNORMAL HIGH (ref 80.0–100.0)
Monocytes Absolute: 0.6 10*3/uL (ref 0.1–1.0)
Monocytes Relative: 14 %
Neutro Abs: 2.1 10*3/uL (ref 1.7–7.7)
Neutrophils Relative %: 52 %
Platelets: 144 10*3/uL — ABNORMAL LOW (ref 150–400)
RBC: 3.18 MIL/uL — ABNORMAL LOW (ref 3.87–5.11)
RDW: 13.8 % (ref 11.5–15.5)
WBC: 4.1 10*3/uL (ref 4.0–10.5)
nRBC: 0 % (ref 0.0–0.2)

## 2022-10-15 LAB — COMPREHENSIVE METABOLIC PANEL
ALT: 5 U/L (ref 0–44)
AST: 16 U/L (ref 15–41)
Albumin: 3.5 g/dL (ref 3.5–5.0)
Alkaline Phosphatase: 76 U/L (ref 38–126)
Anion gap: 5 (ref 5–15)
BUN: 13 mg/dL (ref 8–23)
CO2: 27 mmol/L (ref 22–32)
Calcium: 9.2 mg/dL (ref 8.9–10.3)
Chloride: 105 mmol/L (ref 98–111)
Creatinine, Ser: 1.17 mg/dL — ABNORMAL HIGH (ref 0.44–1.00)
GFR, Estimated: 51 mL/min — ABNORMAL LOW (ref >60)
Glucose, Bld: 93 mg/dL (ref 70–99)
Potassium: 4.4 mmol/L (ref 3.5–5.1)
Sodium: 137 mmol/L (ref 135–145)
Total Bilirubin: 0.6 mg/dL (ref 0.3–1.2)
Total Protein: 6.2 g/dL — ABNORMAL LOW (ref 6.5–8.1)

## 2022-10-15 MED ORDER — LISINOPRIL 20 MG PO TABS: 20.0000 mg | ORAL_TABLET | Freq: Every day | ORAL | 1 refills | Status: DC

## 2022-10-15 NOTE — Progress Notes (Signed)
[facility] OFFICE PROGRESS NOTE  Patient Care Team: Default, Provider, MD as PCP - Danella Maiers, MD as Consulting Physician (Hematology and Oncology) Adolphus Birchwood, MD as Consulting Physician (Obstetrics and Gynecology)  ASSESSMENT & PLAN:  Adenocarcinoma of right fallopian tube [facility]) So far, she tolerated Zejula well We discussed future follow-up We discussed side effects to be expected Her counts are stable I will lengthen her visit to every 6 weeks Recommend repeat imaging study in 3 months, due around September  Pancytopenia, acquired [facility]) She is not symptomatic from anemia Observe closely  Essential hypertension Her blood pressure is trending high She will continue atenolol in the morning and I recommend lisinopril in the evening She will continue to check her blood pressure daily  No orders of the defined types were placed in this encounter.   All questions were answered. The patient knows to call the clinic with any problems, questions or concerns. The total time spent in the appointment was 20 minutes encounter with patients including review of chart and various tests results, discussions about plan of care and coordination of care plan   Artis Delay, MD 10/15/2022 2:22 PM  INTERVAL HISTORY: Please see below for problem oriented charting. she returns for treatment follow-up on Zejula with her husband She is doing well She denies missing doses She have no side effects whatsoever Her blood pressure is noted to be consistently elevated in the evening She is not symptomatic  REVIEW OF SYSTEMS:   Constitutional: Denies fevers, chills or abnormal weight loss Eyes: Denies blurriness of vision Ears, nose, mouth, throat, and face: Denies mucositis or sore throat Respiratory: Denies cough, dyspnea or wheezes Cardiovascular: Denies palpitation, chest discomfort or lower extremity swelling Gastrointestinal:  Denies nausea, heartburn or change in bowel  habits Skin: Denies abnormal skin rashes Lymphatics: Denies new lymphadenopathy or easy bruising Neurological:Denies numbness, tingling or new weaknesses Behavioral/Psych: Mood is stable, no new changes  All other systems were reviewed with the patient and are negative.  I have reviewed the past medical history, past surgical history, social history and family history with the patient and they are unchanged from previous note.  ALLERGIES:  is allergic to dilaudid [hydromorphone hcl], morphine and codeine, and sudafed [pseudoephedrine hcl].  MEDICATIONS:  Current Outpatient Medications  Medication Sig Dispense Refill   ALAWAY 0.035 % ophthalmic solution Place 1 drop into both eyes 2 (two) times daily as needed (for irritation).     atenolol (TENORMIN) 25 MG tablet Take 1 tablet (25 mg total) by mouth daily. 30 tablet 2   docusate sodium (COLACE) 100 MG capsule Take 100 mg by mouth daily as needed for mild constipation.     lidocaine-prilocaine (EMLA) cream Apply to affected area once daily as directed. (Patient taking differently: 1 application  daily as needed (as directed- to access port).) 30 g 3   lisinopril (ZESTRIL) 20 MG tablet Take 1 tablet (20 mg total) by mouth daily. 60 tablet 1   MIRALAX 17 GM/SCOOP powder Take 17 g by mouth daily as needed for mild constipation.     niraparib tosylate (ZEJULA) 100 MG tablet Take 1 tablet (100 mg total) by mouth daily. May take at bedtime to reduce nausea and vomiting. 30 tablet 3   ondansetron (ZOFRAN) 8 MG tablet Take 1 tablet (8 mg total) by mouth every 8 (eight) hours as needed for nausea or vomiting. Start on the third day after chemotherapy. 30 tablet 1   prochlorperazine (COMPAZINE) 10 MG tablet Take  1 tablet (10 mg total) by mouth every 6 (six) hours as needed for nausea or vomiting. 30 tablet 1   sodium chloride (OCEAN) 0.65 % SOLN nasal spray Place 1 spray into both nostrils as needed for congestion.     TYLENOL 8 HOUR ARTHRITIS PAIN 650  MG CR tablet Take 650-1,300 mg by mouth every 8 (eight) hours as needed for pain.     venlafaxine XR (EFFEXOR-XR) 37.5 MG 24 hr capsule TAKE 1 CAPSULE BY MOUTH DAILY WITH BREAKFAST. (Patient taking differently: Take 37.5 mg by mouth daily with breakfast.) 90 capsule 3   No current facility-administered medications for this visit.    SUMMARY OF ONCOLOGIC HISTORY: Oncology History Overview Note  Neg genetics High grade serous    Adenocarcinoma of right fallopian tube (HCC)  08/12/2016 Initial Diagnosis   The patient has many months of vague symptoms of fullness in the pelvis, urinary frequency and difficulty with defecation. She denies vaginal bleeding or rectal bleeding.     08/12/2016 Imaging   Abdomen X-ray in the ER: Moderate colonic stool burden without evidence of enteric obstruction   11/13/2016 Imaging   TVUS was performed on 11/13/16 which showed a large hypoechoic lobular mass seen posterior to the uterus measuring 18.2x16.1x11.8cm, blood flow was seen along the periphery of the mass. It was considered to be likely to be a fibroid vs pelvic mass vs ovarian mass. Neither ovary was removed. Moderate amount of free fluid in the pelvis. THe uterus measures 4x10x4cm. The endometrium is 4mm.    12/05/2016 Imaging   MR pelvis 1. Mild limitations as detailed above. 2. Heterogeneous pelvic mass is favored to arise from the right ovary. Favor a solid ovarian neoplasm such as fibroma/Brenner's tumor. Given lesion size and heterogeneous T2 signal out, complicating torsion cannot be excluded. 3. Moderate abdominopelvic ascites, without specific evidence of peritoneal metastasis. Consider further evaluation with contrast-enhanced abdominopelvic CT.    12/26/2016 Pathology Results   1. Uterus, ovaries and fallopian tubes - ADENOCARCINOMA INVOLVING RIGHT FALLOPIAN TUBE, RIGHT AND LEFT OVARIES, UTERINE SEROSA AND PELVIC MASS. - SEE ONCOLOGY TABLE AND COMMENT. - UTERINE CERVIX, ENDOMETRIUM,  MYOMETRIUM AND LEFT FALLOPIAN TUBE FREE OF TUMOR. 2. Omentum, resection for tumor - ADENOCARCINOMA. Microscopic Comment 1. ONCOLOGY TABLE - FALLOPIAN TUBE. SEE COMMENT 1. Specimen, including laterality: Uterus, bilateral adnexa, pelvic mass and omentum. 2. Procedure: Hysterectomy with bilateral salpingo-oophorectomy, pelvic mass excision and omentectomy. 3. Lymph node sampling performed: No 4. Tumor site: See comment. 5. Tumor location in fallopian tube: Right fallopian tube fimbria 6. Specimen integrity (intact/ruptured/disrupted): Intact 7. Tumor size (cm): 18 cm, see comment. 8. Histologic type: Adenocarcinoma 9. Grade: High grade 10. Microscopic tumor extension: Tumor involves right fallopian tube, right and left ovaries, uterine serosa, pelvic mass and omentum. 11. Margins: See comment. 12. Lymph-Vascular invasion: Present 13. Lymph nodes: # examined: 0; # positive: N/A 14. TNM: pT3c, pNX 15. FIGO Stage (based on pathologic findings, needs clinical correlation: III-C 16. Comments: There is an 18 cm pelvic mass which is a high grade adenocarcinoma and there is a 12.2 cm  segment of omentum which is extensively involved with adenocarcinoma. The tumor also involves the fimbria of the right fallopian tube and the parenchyma of the right and left ovaries as well as uterine serosa. The fimbria of the right fallopian has intraepithelial atypia consistent with a precursor lesion and therefore this is most consistent with primary fallopian tube adenocarcinoma. A primary peritoneal serous carcinoma is a less likely possibility.  12/26/2016 Pathology Results   PERITONEAL/ASCITIC FLUID(SPECIMEN 1 OF 1 COLLECTED 12/26/16): POORLY DIFFERENTIATED ADENOCARCINOMA   12/26/2016 Surgery   Procedure(s) Performed: Exploratory laparotomy with total abdominal hysterectomy, bilateral salpingo-oophorectomy, omentectomy radical tumor debulking for ovarian cancer .   Surgeon: Luisa Dago, MD.      Operative  Findings: 20cm left ovarian mass densely adherent to the posterior uterus, right tube and ovary, cervix, sigmoid colon. 10cm omental cake. 4L ascites. 1mm size tumor nodules on the serosa of the terminal ileum and proximal sigmoid colon. 1mm nodules on right diaphragm.    This represented an optimal cytoreduction (R1) with 1mm nodules on intestine and diaphragm representing gross visible disease   01/16/2017 Procedure   Placement of single lumen port a cath via right internal jugular vein. The catheter tip lies at the cavo-atrial junction. A power injectable port a cath was placed and is ready for immediate use.   01/17/2017 Imaging   1. 3.5 x 7.2 x 4.6 cm fluid collection along the vaginal cuff, posterior the bladder and extending into the right adnexal space. This lesion demonstrates rim enhancement. Imaging features could be related to a loculated postoperative seroma or hematoma. Superinfection cannot be excluded by CT. Given debulking surgery was 3 weeks ago, this entire structure is un likely to represent neoplasm, but peritoneal involvement could have this appearance. 2. Small fluid collection in the left para colic gutter without rim enhancement. 3. Irregular/nodular appearance of the peritoneal M in the anatomic pelvis with areas of subtle nodularity between the stomach and the spleen. Close attention in these regions on follow-up recommended as metastatic disease is a concern. 4. Mildly enlarged hepatoduodenal ligament lymph node. Attention on follow-up recommended.   01/20/2017 Tumor Marker   Patient's tumor was tested for the following markers: CA-125 Results of the tumor marker test revealed 46.6   01/28/2017 Genetic Testing       Negative genetic testing on the [facility] panel.  The [facility] gene panel offered by [employer] includes sequencing and deletion/duplication testing of the following 28 genes: APC, ATM, BARD1, BMPR1A, BRCA1, BRCA2, BRIP1, CHD1, CDK4, CDKN2A,  CHEK2, EPCAM (large rearrangement only), MLH1, MSH2, MSH6, MUTYH, NBN, PALB2, PMS2, PTEN, RAD51C, RAD51D, SMAD4, STK11, and TP53. Sequencing was performed for select regions of POLE and POLD1, and large rearrangement analysis was performed for select regions of GREM1. The report date is January 28, 2017.  HRD testing looking for genomic instability and BRCA mutations was negative.  The report date of this test is January 27, 2017.    01/31/2017 Tumor Marker   Patient's tumor was tested for the following markers: CA-125 Results of the tumor marker test revealed 22.4   03/04/2017 Tumor Marker   Patient's tumor was tested for the following markers: CA-125 Results of the tumor marker test revealed 13.8   04/18/2017 Tumor Marker   Patient's tumor was tested for the following markers: CA-125 Results of the tumor marker test revealed 11.9   05/05/2017 Tumor Marker   Patient's tumor was tested for the following markers: CA-125 Results of the tumor marker test revealed 12   05/26/2017 Tumor Marker   Patient's tumor was tested for the following markers: CA-125 Results of the tumor marker test revealed 10.6   05/26/2017 Imaging   1. A previously noted postoperative fluid collection in the low pelvis and residual ascites seen on the prior examination has resolved on today's study. No findings to suggest residual/recurrent disease on today's examination. No definite solid organ metastasis  identified in the abdomen or pelvis. No lymphadenopathy. 2. Aortic atherosclerosis.   06/10/2017 Procedure   Successful right IJ vein Port-A-Cath explant.   03/09/2018 Tumor Marker   Patient's tumor was tested for the following markers: CA-125 Results of the tumor marker test revealed 12.1   05/26/2018 Tumor Marker   Patient's tumor was tested for the following markers: CA-125 Results of the tumor marker test revealed 13.8   09/09/2018 Tumor Marker   Patient's tumor was tested for the following markers:  CA-125 Results of the tumor marker test revealed 23.9   12/18/2018 Tumor Marker   Patient's tumor was tested for the following markers: CA-125 Results of the tumor marker test revealed 43.8   02/27/2019 - 02/28/2019 Hospital Admission   She was admitted for bowel obstruction   02/27/2019 Imaging   1. High-grade small bowel obstruction with transition point in the pelvis in an area of suspected desmoplastic reaction surrounding a 1.3 cm spiculated nodule in the mesentery, suspicious for carcinoid. There is hyperenhancement near the tip of the appendix and loss of the normal fat plane between it and the adjacent sigmoid colon, potentially the site of primary lesion. 2. Multiple new small subcentimeter hyperdense lesions scattered along the liver capsule, concerning for metastases. Enlarged hyperenhancing gastrohepatic and portacaval lymph nodes concerning for nodal metastases. 3. Very mild right hydroureteronephrosis to the level of the desmoplastic reaction in the pelvis, potentially involving the right ureter. 4. Infraumbilical ventral abdominal wall diastasis containing a small nondilated portion of transverse colon. 5. Trace ascites. 6. Cholelithiasis. 7.  Aortic atherosclerosis (ICD10-I70.0).     03/05/2019 Tumor Marker   Patient's tumor was tested for the following markers: CA-125 Results of the tumor marker test revealed 70.1   03/12/2019 Echocardiogram   IMPRESSIONS     1. Left ventricular ejection fraction, by visual estimation, is 60 to 65%. The left ventricle has normal function. There is no left ventricular hypertrophy.  2. Left ventricular diastolic parameters are consistent with Grade I diastolic dysfunction (impaired relaxation).  3. Global right ventricle has normal systolic function.The right ventricular size is normal. No increase in right ventricular wall thickness.  4. Left atrial size was normal.  5. Right atrial size was normal.  6. The pericardium was not assessed.   7. The mitral valve is normal in structure. No evidence of mitral valve regurgitation.  8. The tricuspid valve is normal in structure. Tricuspid valve regurgitation is trivial.  9. The aortic valve is normal in structure. Aortic valve regurgitation is not visualized. 10. The pulmonic valve was grossly normal. Pulmonic valve regurgitation is not visualized. 11. The average left ventricular global longitudinal strain is -17.6 %.   03/16/2019 Procedure   Successful placement of a right internal jugular approach power injectable Port-A-Cath. The catheter is ready for immediate use.     04/19/2019 Tumor Marker   Patient's tumor was tested for the following markers: CA-125 Results of the tumor marker test revealed 39.8   05/17/2019 Tumor Marker   Patient's tumor was tested for the following markers: CA-125 Results of the tumor marker test revealed 27   05/28/2019 Tumor Marker   Patient's tumor was tested for the following markers: CA-125 Results of the tumor marker test revealed 27.7.   06/11/2019 Imaging   1. No new or progressive metastatic disease in the abdomen or pelvis. 2. Tiny noncalcified perisplenic implant is decreased. Calcified pericardiophrenic, retroperitoneal and bilateral inguinal lymph nodes and scattered small calcified perihepatic and perisplenic implants are stable. 3.  Aortic Atherosclerosis (ICD10-I70.0).       06/17/2019 Echocardiogram    1. Left ventricular ejection fraction, by estimation, is 65 to 70%. The left ventricle has normal function. The left ventricle has no regional wall motion abnormalities. Left ventricular diastolic parameters were normal.  2. Right ventricular systolic function is normal. The right ventricular size is normal.  3. The mitral valve is normal in structure and function. No evidence of mitral valve regurgitation. No evidence of mitral stenosis.  4. The aortic valve is normal in structure and function. Aortic valve regurgitation is not  visualized. No aortic stenosis is present.   06/21/2019 Tumor Marker   Patient's tumor was tested for the following markers: CA-125 Results of the tumor marker test revealed 18.7   07/19/2019 Tumor Marker   Patient's tumor was tested for the following markers: CA-125 Results of the tumor marker test revealed 16.7   08/30/2019 Tumor Marker   Patient's tumor was tested for the following markers: CA-125. Results of the tumor marker test revealed 13.9   09/15/2019 Echocardiogram    1. Left ventricular ejection fraction, by estimation, is 65 to 70%. The left ventricle has normal function. The left ventricle has no regional wall motion abnormalities. Left ventricular diastolic parameters are consistent with Grade I diastolic dysfunction (impaired relaxation). The average left ventricular global longitudinal strain is 18.1 %. The global longitudinal strain is normal.  2. Right ventricular systolic function is normal. The right ventricular size is normal.  3. The mitral valve is normal in structure. No evidence of mitral valve regurgitation. No evidence of mitral stenosis.  4. The aortic valve is normal in structure. Aortic valve regurgitation is not visualized. No aortic stenosis is present.  5. The inferior vena cava is normal in size with greater than 50% respiratory variability, suggesting right atrial pressure of 3 mmHg.     09/24/2019 Imaging   1. Stable exam. No new or progressive metastatic disease on today's study. 2. Small subcapsular hypodensity in the lateral spleen, new in the interval, but indeterminate. Attention on follow-up recommended. 3. The calcified upper abdominal and groin lymph nodes are stable as are the scattered calcified and noncalcified peritoneal implants. 4. Stable midline ventral hernia containing a short segment of colon without complicating features. 5. Aortic Atherosclerosis (ICD10-I70.0).     10/04/2019 - 09/21/2021 Chemotherapy   Patient is on Treatment Plan :  ovarian Bevacizumab q21d     10/04/2019 Tumor Marker   Patient's tumor was tested for the following markers: CA-125. Results of the tumor marker test revealed 12.9.   11/01/2019 Tumor Marker   Patient's tumor was tested for the following markers: CA-125 Results of the tumor marker test revealed 15.4   12/13/2019 Tumor Marker   Patient's tumor was tested for the following markers: CA-125 Results of the tumor marker test revealed 14.8   12/31/2019 Imaging   1. Unchanged tiny peritoneal nodule adjacent to the spleen measuring 6 mm. Other previously noted peritoneal nodules are imperceptible on present examination. 2. Unchanged prominent, partially calcified portacaval lymph node and bilateral inguinal lymph nodes. 3. Unchanged subtle, nonspecific subcapsular lesion of the spleen.  4. No evidence of new metastatic disease in the abdomen or pelvis. 5. Status post hysterectomy. 6. Unchanged low midline ventral abdominal hernia containing a single loop of nonobstructed transverse colon. 7. Cholelithiasis. 8. Aortic Atherosclerosis (ICD10-I70.0).       01/03/2020 Tumor Marker   Patient's tumor was tested for the following markers: CA-125 Results of the tumor marker test  revealed 11.7.   01/24/2020 Tumor Marker   Patient's tumor was tested for the following markers: CA-125. Results of the tumor marker test revealed 15.1   03/06/2020 Tumor Marker   Patient's tumor was tested for the following markers: CA-125. Results of the tumor marker test revealed 20.3   03/27/2020 Tumor Marker   Patient's tumor was tested for the following markers: CA-125 Results of the tumor marker test revealed 23.7   05/11/2020 Tumor Marker   Patient's tumor was tested for the following markers: CA-125. Results of the tumor marker test revealed 25.4   06/23/2020 Imaging   1. Cholelithiasis with gallbladder wall thickening and mild infiltrative edema in the porta hepatis, raising the possibility of acute  cholecystitis. There is truncation of the common bile duct distally and distal choledocholithiasis is difficult to exclude. Correlate with bilirubin levels. If clinically warranted, MRCP could be utilized for further characterization. 2. Stable tiny perisplenic nodule. 3. Other imaging findings of potential clinical significance: Small type 1 hiatal hernia. Prominent stool throughout the colon favors constipation. Ventral infraumbilical hernia contains transverse colon without findings of strangulation or obstruction. Small supraumbilical hernia contains adipose tissue. Lumbar degenerative disc disease at L5-S1. Stable tiny nodule along the lateral margin of the spleen, nonspecific. 4. Aortic atherosclerosis.     12/15/2020 Imaging   No evidence of recurrent or metastatic carcinoma within the abdomen or pelvis.   Stable ventral hernia containing transverse colon. No evidence of bowel obstruction or strangulation.   Cholelithiasis. No radiographic evidence of cholecystitis.   Tiny hiatal hernia.   Aortic Atherosclerosis (ICD10-I70.0).     06/11/2021 Imaging   1. Numerous sub 4 mm pulmonary nodules in the lungs some of which have a cavitary appearance. I think it is unlikely this is metastatic disease and more likely a possible immunotherapy related process such as a sarcoid like reaction. Recommend full chest CT further evaluation. 2. Stable small calcified retroperitoneal and mesenteric lymph nodes. No findings suspicious for recurrent peritoneal carcinomatosis. 3. Stable lower abdominal wall hernia containing part of the transverse colon. 4. Cholelithiasis.   09/27/2021 Imaging   1. Numerous small pulmonary nodules throughout the lungs, the majority of which are cavitary. These are all subcentimeter, however nodules that were seen in the bilateral lung bases on prior CT dated 06/08/2021 appear slightly increased in size and number. Behavior is highly suspicious for pulmonary metastatic disease  despite the unusual appearance, however as previously reported general differential considerations include atypical infection and inflammation, such as drug-induced sarcoidosis like reaction.  2. Unchanged calcified lymph nodes in the abdomen and pelvis, consistent with treated nodal metastases. 3. Cholelithiasis with wall thickening and pericholecystic fluid. This appearance is similar to prior examination and suggests chronic cholecystitis. Correlate for clinical symptoms. Mild intrahepatic biliary ductal dilatation, similar to prior examination. 4. Midline ventral hernia containing a single nonobstructed loop of mid transverse colon.     12/25/2021 Imaging   1. Again noted are multiple thin walled cavitary lung nodules throughout both lungs. These are similar in size and multiplicity when compared with the exam from 09/26/2021. Etiology remains indeterminate, although metastatic disease cannot be excluded with a high degree of certainty. 2. Stable calcified upper abdominal lymph nodes consistent with treated nodal metastases. 3. Gallstones. 4. Aortic Atherosclerosis (ICD10-I70.0).   01/30/2022 Tumor Marker   Patient's tumor was tested for the following markers: CA-125. Results of the tumor marker test revealed 59.3.   01/31/2022 Imaging   1. No substantial interval change in exam. 2. Innumerable  tiny cavitary pulmonary nodules in the lung bases. As noted previously, metastatic disease would be distinct consideration although imaging features are nonspecific and infectious/inflammatory etiology is not excluded. 3. Calcified nodal disease in the upper abdomen and pelvis shows no substantial interval change. Scattered noncalcified nodules in the mesentery are similar to prior. 4. Markedly large stool volume throughout the colon. Imaging features suggestive of clinical constipation. 5. Cholelithiasis with ill-defined appearance of the gallbladder wall. Appearance is stable in the interval. 6.  Trace free fluid in the cul-de-sac. 7. Midline ventral hernia contains a short segment of transverse colon without complicating features. 8. Aortic Atherosclerosis (ICD10-I70.0).   03/07/2022 Surgery   Procedure Date:  03/07/2022  Pre-operative Diagnosis:  Symptomatic cholelithiasis   Post-operative Diagnosis: Symptomatic cholelithiasis, peritoneal implants with history of right fallopian tube cancer.   Procedure:   Robotic assisted cholecystectomy with ICG FireFly cholangiogram Robotic assisted abdominal wall peritoneal implant excisional biopsy x 3.   Surgeon:  Howie Ill, MD  Specimens:   Gallbladder Peritoneal implants x 3    Indications for Procedure:  This is a 67 y.o. female who presents with abdominal pain and workup revealing symptomatic cholelithiasis.  The benefits, complications, treatment options, and expected outcomes were discussed with the patient. The risks of bleeding, infection, recurrence of symptoms, failure to resolve symptoms, bile duct damage, bile duct leak, retained common bile duct stone, bowel injury, and need for further procedures were all discussed with the patient and she was willing to proceed.     03/07/2022 Pathology Results   SURGICAL PATHOLOGY  CASE: [phone]  PATIENT: Edwin Cap  Surgical Pathology Report   Specimen Submitted:  A. Gallbladder  B. Abdominal wall   Clinical History: Symptomatic cholelithiasis   DIAGNOSIS:  A. GALLBLADDER; CHOLECYSTECTOMY:  - CHRONIC CHOLECYSTITIS WITH CHOLELITHIASIS.  - NEGATIVE FOR DYSPLASIA AND MALIGNANCY.  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   B. SOFT TISSUE, ABDOMINAL WALL; BIOPSY:  - POSITIVE FOR MALIGNANCY.  - METASTATIC CARCINOMA, COMPATIBLE WITH HIGH-GRADE SEROUS CARCINOMA OF GYNECOLOGIC PRIMARY.   Comment:  Immunohistochemical studies were performed on neoplastic cells in both sources. Tumor cells  in both are positive for CK7, Pax-8, and WT-1, supporting the above diagnosis. P53 demonstrates strong and diffuse expression, indicative of mutated expression pattern.   There is sufficient tissue present for ancillary molecular testing if desired.    04/04/2022 Imaging   1. Mild progression of partially calcified peritoneal metastatic implants surrounding the liver, in the porta hepatis and in the periumbilical region. No generalized ascites or peritoneal nodularity status post omentectomy. 2. Grossly stable cavitary lesions at both lung bases, presumably reflecting metastatic disease. 3. No other evidence of progressive metastatic disease within the abdomen or pelvis. 4. Enlarging ventral hernia containing a portion of the transverse colon. No evidence of bowel obstruction or inflammation. 5.  Aortic Atherosclerosis (ICD10-I70.0).   04/08/2022 - 08/31/2022 Chemotherapy   Patient is on Treatment Plan : OVARIAN Carboplatin + Paclitaxel + Bevacizumab q21d      04/09/2022 Tumor Marker   Patient's tumor was tested for the following markers: CA-125. Results of the tumor marker test revealed 86.7.   05/22/2022 Tumor Marker   Patient's tumor was tested for the following markers: CA-125. Results of the tumor marker test revealed 49.7.   06/05/2022 Imaging   1. New tiny bilateral pleural effusions. 2. Once again there are several small cavitary lesions in the lungs. Many of these are becoming more  cavitary with less soft tissue elements and are smaller today. 3. Soft tissue thickening and nodularity along the surface of the liver appears to be overall decreasing from previous examination. Some of these areas have calcification. 4. Mesenteric nodules as well as retroperitoneal and mesenteric nodes appear similar to slightly improved. The inguinal nodes seen previously are similar. 5. No developing new mass lesion, ascites or lymph node enlargement seen. 6. Of interest the density of the lumen of the  renal pelvis bilaterally is increased on the left compared to the right in the portal venous phase. This is of uncertain etiology and significance. Please correlate with the urinalysis such as for hematuria.   09/02/2022 Tumor Marker   Patient's tumor was tested for the following markers: CA-125. Results of the tumor marker test revealed 38.3.   09/12/2022 Imaging   CT CHEST ABDOMEN PELVIS W CONTRAST  Result Date: 09/12/2022 CLINICAL DATA:  Assess treatment response ovarian cancer * Tracking Code: BO * EXAM: CT CHEST, ABDOMEN, AND PELVIS WITH CONTRAST TECHNIQUE: Multidetector CT imaging of the chest, abdomen and pelvis was performed following the standard protocol during bolus administration of intravenous contrast. RADIATION DOSE REDUCTION: This exam was performed according to the departmental dose-optimization program which includes automated exposure control, adjustment of the mA and/or kV according to patient size and/or use of iterative reconstruction technique. CONTRAST:  OMNIPAQUE IOHEXOL 300 MG/ML  SOLN COMPARISON:  Abdomen pelvis CT 06/05/2022 and older. Chest CT 12/25/2021 and older. FINDINGS: CT CHEST FINDINGS Cardiovascular: Right upper chest port. Heart is nonenlarged. No pericardial effusion. Thoracic aorta has a normal course and caliber with mild atherosclerotic plaque. Mediastinum/Nodes: Stable thyroid gland. Slightly patulous thoracic esophagus with small hiatal hernia. No specific abnormal lymph node enlargement identified including axillary regions, hilum or mediastinum. No thoracic inlet abnormal nodes. One of the lymph nodes in the left axilla has some calcification, unchanged from previous. Lungs/Pleura: Lungs are without consolidation, pneumothorax or effusion. Few areas of peripheral interstitial septal thickening, scarring and fibrotic change. Previously there were numerous cavitary nodules in both lungs. The extent and distribution appears overall decreasing. The residual  cavitary lesions have thinner walls than previous from September 2023. Specific lesions will be followed for continuity and compared in measurement to that exam. The nodules that are seen on the more recent study of the abdomen and pelvis from February 2024 also show improvement and wall thickness. Right upper lobe focus laterally which previously measured 6 mm in diameter, today is measured larger at 7 mm but again has a much thinner wall. Left upper lobe focus which previously has a diameter of 6 mm, today on series 4, image 53 has a diameter of 7 mm. Again much thinner wall. Less nodular. Focus right lower lobe laterally and posteriorly which previously measured 6 mm, today on series 4, image 116 measures 5 mm. Wall is thinner. And lastly nodule middle lobe posteriorly which measured 3 mm on the prior, today on series 4, image 76 is smaller at proximally 1-2 mm. Musculoskeletal: Mild curvature of the spine. Scattered degenerative changes. CT ABDOMEN PELVIS FINDINGS Hepatobiliary: Diffuse fatty liver infiltration. No space-occupying liver lesion. Patent portal vein. Previous cholecystectomy. Once again there is some thickening and calcification along falciform ligament which is similar on today's examination, series 2, image 52 for example. The nodule anterior to the left hepatic lobe towards the dome and margin of the diaphragm which previously measured 13 x 7 mm, today has some residual tissue measuring 10 x 4 mm. Additional  focus posterior to segment 7 in the right hepatic lobe and laterally of also improved. Less thickening of the rind of soft tissue along the surface of the liver laterally. Pancreas: Global pancreatic atrophy without mass. Spleen: Preserved enhancement. Spleen previously was mildly enlarged at 13.1 cm cephalocaudal length and today measured at 12.7 cm. Adrenals/Urinary Tract: Adrenal glands are preserved. No enhancing renal mass or collecting system dilatation. Extrarenal pelvis seen to both  kidneys. Stable Bosniak 1 exophytic left-sided renal cystic lesion. No specific imaging follow-up. The ureters have normal course and caliber down to the bladder. Preserved contours of the urinary bladder. Stomach/Bowel: Large amount of diffuse colonic stool. Focal caliber change in the course of the colon along the proximal sigmoid colon, nonspecific. Stomach and small bowel are nondilated. Small hiatal hernia. Once again there is rectus muscle diastasis with protuberance of bowel loops and fat in this location at the level of the upper pelvis. Again no signs of obstruction. Vascular/Lymphatic: Scattered vascular calcifications. Normal caliber aorta and IVC. Of interest best seen on coronal imaging there is a slightly tortuous course of the portal vein with what may be a thin stricture or web as seen on sagittal series 5, image 68. In retrospect this was present and has a uncertain etiology and significance. Several prominent lymph nodes are again identified. Specific lesions will be followed. Upper retroperitoneal nodes of the celiac axis are again seen. Example which previously measured 12 x 12 mm with some calcification, today on series 2, image 56 measures 9 by 10 mm. Lesion just caudal to this which previously measured 19 x 10 mm, today measures 15 by 10 mm on series 2, image 57. Right-sided central mesenteric lymph node which previously measured 14 x 9 mm, today measures 10 x 5 mm on series 2, image 71. Central pelvic mesenteric node which previously measured 13 x 9 mm, today on series 2 image 94 measures 12 x 7 mm. This has a fatty hilum. Bilateral inguinal nodes with calcifications are seen. Left-sided focus previously measured 16 x 14 mm and today 13 x 11 mm. Right-sided inguinal focus which previously measured 17 x 14 mm, today series 2, image 115 measures 15 by 12 mm. Reproductive: Status post hysterectomy. No adnexal masses. Other: No free air or free fluid. The mesenteric cystic focus along the left  side on the prior examination is not well seen today. Musculoskeletal: Curvature of the spine. Scattered degenerative changes of the spine and pelvis. IMPRESSION: Overall interval improvement. Decreasing surface nodularity and thickening along the liver. Decreasing lymph nodes overall. No new ascites or lymph node enlargement. Previous pleural effusions no longer identified from the study of February 2024. Several cavitary lesions identified throughout the lungs continue to show decreasing wall thickening and nodularity. Some of the lesions measured larger than the study of September 2023 but this could be evolution of treatment with decreasing soft tissue components Fatty liver infiltration.  Borderline spleen size. Large amount of colonic stool Electronically Signed   By: Karen Kays M.D.   On: 09/12/2022 09:56      09/25/2022 -  Chemotherapy    She started taking zejula   10/04/2022 Tumor Marker   Patient's tumor was tested for the following markers: CA-125. Results of the tumor marker test revealed 23.9.     PHYSICAL EXAMINATION: ECOG PERFORMANCE STATUS: 0 - Asymptomatic  Vitals:   10/15/22 1335  BP: (!) 150/59  Pulse: 63  Resp: 18  SpO2: 98%   Filed Weights  10/15/22 1335  Weight: 138 lb 6.4 oz (62.8 kg)    GENERAL:alert, no distress and comfortable NEURO: alert & oriented x 3 with fluent speech, no focal motor/sensory deficits  LABORATORY DATA:  I have reviewed the data as listed    Component Value Date/Time   NA 137 10/03/2022 1433   NA 139 04/14/2017 0742   K 4.2 10/03/2022 1433   K 4.0 04/14/2017 0742   CL 104 10/03/2022 1433   CO2 27 10/03/2022 1433   CO2 21 (L) 04/14/2017 0742   GLUCOSE 79 10/03/2022 1433   GLUCOSE 247 (H) 04/14/2017 0742   BUN 14 10/03/2022 1433   BUN 13.1 04/14/2017 0742   CREATININE 1.12 (H) 10/03/2022 1433   CREATININE 0.83 08/29/2022 0754   CREATININE 0.9 04/14/2017 0742   CALCIUM 9.1 10/03/2022 1433   CALCIUM 9.2 04/14/2017 0742    PROT 6.3 (L) 10/03/2022 1433   PROT 7.3 04/14/2017 0742   ALBUMIN 3.6 10/03/2022 1433   ALBUMIN 3.9 04/14/2017 0742   AST 15 10/03/2022 1433   AST 13 (L) 08/29/2022 0754   AST 17 04/14/2017 0742   ALT 5 10/03/2022 1433   ALT 6 08/29/2022 0754   ALT 14 04/14/2017 0742   ALKPHOS 68 10/03/2022 1433   ALKPHOS 70 04/14/2017 0742   BILITOT 0.5 10/03/2022 1433   BILITOT 0.4 08/29/2022 0754   BILITOT 0.37 04/14/2017 0742   GFRNONAA 54 (L) 10/03/2022 1433   GFRNONAA >60 08/29/2022 0754   GFRAA >60 01/24/2020 0924   GFRAA >60 07/19/2019 0951    No results found for: "SPEP", "UPEP"  Lab Results  Component Value Date   WBC 8.0 10/03/2022   NEUTROABS 5.1 10/03/2022   HGB 10.3 (L) 10/03/2022   HCT 31.9 (L) 10/03/2022   MCV 104.6 (H) 10/03/2022   PLT 196 10/03/2022      Chemistry      Component Value Date/Time   NA 137 10/03/2022 1433   NA 139 04/14/2017 0742   K 4.2 10/03/2022 1433   K 4.0 04/14/2017 0742   CL 104 10/03/2022 1433   CO2 27 10/03/2022 1433   CO2 21 (L) 04/14/2017 0742   BUN 14 10/03/2022 1433   BUN 13.1 04/14/2017 0742   CREATININE 1.12 (H) 10/03/2022 1433   CREATININE 0.83 08/29/2022 0754   CREATININE 0.9 04/14/2017 0742      Component Value Date/Time   CALCIUM 9.1 10/03/2022 1433   CALCIUM 9.2 04/14/2017 0742   ALKPHOS 68 10/03/2022 1433   ALKPHOS 70 04/14/2017 0742   AST 15 10/03/2022 1433   AST 13 (L) 08/29/2022 0754   AST 17 04/14/2017 0742   ALT 5 10/03/2022 1433   ALT 6 08/29/2022 0754   ALT 14 04/14/2017 0742   BILITOT 0.5 10/03/2022 1433   BILITOT 0.4 08/29/2022 0754   BILITOT 0.37 04/14/2017 0742

## 2022-10-15 NOTE — Assessment & Plan Note (Signed)
So far, she tolerated Zejula well We discussed future follow-up We discussed side effects to be expected Her counts are stable I will lengthen her visit to every 6 weeks Recommend repeat imaging study in 3 months, due around September

## 2022-10-15 NOTE — Assessment & Plan Note (Signed)
Her blood pressure is trending high She will continue atenolol in the morning and I recommend lisinopril in the evening She will continue to check her blood pressure daily

## 2022-10-15 NOTE — Assessment & Plan Note (Signed)
She is not symptomatic from anemia Observe closely 

## 2022-10-20 ENCOUNTER — Other Ambulatory Visit: Payer: Self-pay | Admitting: Hematology and Oncology

## 2022-10-21 ENCOUNTER — Encounter: Payer: Self-pay | Admitting: Hematology and Oncology

## 2022-10-22 ENCOUNTER — Other Ambulatory Visit: Payer: Self-pay | Admitting: Hematology and Oncology

## 2022-10-25 ENCOUNTER — Other Ambulatory Visit (HOSPITAL_BASED_OUTPATIENT_CLINIC_OR_DEPARTMENT_OTHER): Payer: Self-pay

## 2022-10-25 ENCOUNTER — Emergency Department (HOSPITAL_BASED_OUTPATIENT_CLINIC_OR_DEPARTMENT_OTHER): Payer: Medicare Other | Admitting: Radiology

## 2022-10-25 ENCOUNTER — Other Ambulatory Visit: Payer: Self-pay

## 2022-10-25 ENCOUNTER — Encounter (HOSPITAL_BASED_OUTPATIENT_CLINIC_OR_DEPARTMENT_OTHER): Payer: Self-pay | Admitting: Emergency Medicine

## 2022-10-25 ENCOUNTER — Emergency Department (HOSPITAL_BASED_OUTPATIENT_CLINIC_OR_DEPARTMENT_OTHER)
Admission: EM | Admit: 2022-10-25 | Discharge: 2022-10-25 | Disposition: A | Discharge status: Home / Self Care | Payer: Medicare Other | Attending: Emergency Medicine | Admitting: Emergency Medicine

## 2022-10-25 DIAGNOSIS — Z79899 Other long term (current) drug therapy: Secondary | ICD-10-CM | POA: Diagnosis not present

## 2022-10-25 DIAGNOSIS — Z8543 Personal history of malignant neoplasm of ovary: Secondary | ICD-10-CM | POA: Insufficient documentation

## 2022-10-25 DIAGNOSIS — R052 Subacute cough: Secondary | ICD-10-CM | POA: Diagnosis present

## 2022-10-25 DIAGNOSIS — Z8589 Personal history of malignant neoplasm of other organs and systems: Secondary | ICD-10-CM | POA: Insufficient documentation

## 2022-10-25 DIAGNOSIS — N183 Chronic kidney disease, stage 3 unspecified: Secondary | ICD-10-CM | POA: Diagnosis not present

## 2022-10-25 DIAGNOSIS — I129 Hypertensive chronic kidney disease with stage 1 through stage 4 chronic kidney disease, or unspecified chronic kidney disease: Secondary | ICD-10-CM | POA: Diagnosis not present

## 2022-10-25 DIAGNOSIS — R911 Solitary pulmonary nodule: Secondary | ICD-10-CM | POA: Diagnosis not present

## 2022-10-25 LAB — CBC WITH DIFFERENTIAL/PLATELET
Abs Immature Granulocytes: 0.02 10*3/uL (ref 0.00–0.07)
Basophils Absolute: 0 10*3/uL (ref 0.0–0.1)
Basophils Relative: 1 %
Eosinophils Absolute: 0.4 10*3/uL (ref 0.0–0.5)
Eosinophils Relative: 5 %
HCT: 30.7 % — ABNORMAL LOW (ref 36.0–46.0)
Hemoglobin: 10.2 g/dL — ABNORMAL LOW (ref 12.0–15.0)
Immature Granulocytes: 0 %
Lymphocytes Relative: 14 %
Lymphs Abs: 1.1 10*3/uL (ref 0.7–4.0)
MCH: 34.1 pg — ABNORMAL HIGH (ref 26.0–34.0)
MCHC: 33.2 g/dL (ref 30.0–36.0)
MCV: 102.7 fL — ABNORMAL HIGH (ref 80.0–100.0)
Monocytes Absolute: 0.8 10*3/uL (ref 0.1–1.0)
Monocytes Relative: 10 %
Neutro Abs: 5.4 10*3/uL (ref 1.7–7.7)
Neutrophils Relative %: 70 %
Platelets: 193 10*3/uL (ref 150–400)
RBC: 2.99 MIL/uL — ABNORMAL LOW (ref 3.87–5.11)
RDW: 13.1 % (ref 11.5–15.5)
WBC: 7.7 10*3/uL (ref 4.0–10.5)
nRBC: 0 % (ref 0.0–0.2)

## 2022-10-25 LAB — BASIC METABOLIC PANEL
Anion gap: 10 (ref 5–15)
BUN: 12 mg/dL (ref 8–23)
CO2: 23 mmol/L (ref 22–32)
Calcium: 9.2 mg/dL (ref 8.9–10.3)
Chloride: 101 mmol/L (ref 98–111)
Creatinine, Ser: 1.04 mg/dL — ABNORMAL HIGH (ref 0.44–1.00)
GFR, Estimated: 59 mL/min — ABNORMAL LOW (ref >60)
Glucose, Bld: 136 mg/dL — ABNORMAL HIGH (ref 70–99)
Potassium: 3.8 mmol/L (ref 3.5–5.1)
Sodium: 134 mmol/L — ABNORMAL LOW (ref 135–145)

## 2022-10-25 MED ORDER — IPRATROPIUM-ALBUTEROL 0.5-2.5 (3) MG/3ML IN SOLN
3.0000 mL | Freq: Once | RESPIRATORY_TRACT | Status: AC
  Administered 2022-10-25: 3 mL via RESPIRATORY_TRACT
  Filled 2022-10-25: qty 3

## 2022-10-25 MED ORDER — AZITHROMYCIN 250 MG PO TABS: 250.0000 mg | ORAL_TABLET | Freq: Every day | ORAL | 0 refills | Status: DC

## 2022-10-25 MED ORDER — GUAIFENESIN-CODEINE 100-10 MG/5ML PO SOLN
5.0000 mL | Freq: Once | ORAL | Status: AC
  Administered 2022-10-25: 5 mL via ORAL
  Filled 2022-10-25: qty 5

## 2022-10-25 MED ORDER — GUAIFENESIN-CODEINE 100-10 MG/5ML PO SOLN: 5.0000 mL | Freq: Three times a day (TID) | ORAL | 0 refills | Status: DC | PRN

## 2022-10-25 MED ORDER — PREDNISONE 10 MG (21) PO TBPK: ORAL_TABLET | Freq: Every day | ORAL | 0 refills | Status: DC

## 2022-10-25 MED ORDER — ALBUTEROL SULFATE HFA 108 (90 BASE) MCG/ACT IN AERS: 1.0000 | INHALATION_SPRAY | Freq: Four times a day (QID) | RESPIRATORY_TRACT | 0 refills | Status: DC | PRN

## 2022-10-25 NOTE — ED Provider Notes (Signed)
[place] EMERGENCY DEPARTMENT AT [facility] Provider Note  CSN: [id number] Arrival date & time: 10/25/22 1112  Chief Complaint(s) Cough  HPI Holly [name] is a 67 y.o. 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  She reports intermittently productive cough over the past week, occasionally produces green sputum.  No significant chest pain, fever, chills, nausea or vomiting.  She tried over-the-counter Delsym and Tylenol with moderately for symptoms.  She feels short of breath when she is coughing but otherwise her breathing is similar to her baseline.  No recent travel or sick contacts.  Past Medical History Past Medical History:  Diagnosis Date   Complication of anesthesia    slow to wake up sometimes    GERD (gastroesophageal reflux disease)    History of chemotherapy    History of hiatal hernia    Hypertension    Ovarian cancer (HCC) dx'd 12/2016   recurrent dz 02/2019   PONV (postoperative nausea and vomiting)    Patient Active Problem List   Diagnosis Date Noted   UTI (urinary tract infection) 06/19/2022   Malnutrition of moderate degree 06/19/2022   Dehydration 06/17/2022   Other neutropenia (HCC) 06/17/2022   Other constipation 02/01/2022   Cholelithiasis 01/10/2022   Burn from the sun 11/06/2021   CKD (chronic kidney disease), stage III (HCC) 09/21/2021   Cavitary lesion of lung 06/11/2021   Itch of skin 12/18/2020   Abdominal hernia without obstruction and without gangrene 06/26/2020   Dry mouth 06/26/2020   Elevated serum creatinine 05/11/2020   Encounter for antineoplastic chemotherapy 06/14/2019   Mucositis due to antineoplastic therapy 06/14/2019   Anemia due to antineoplastic chemotherapy 05/17/2019   Pancytopenia, acquired (HCC) 05/28/2017   Essential hypertension 05/05/2017   Peripheral  neuropathy due to chemotherapy (HCC) 03/04/2017   Genetic testing 01/31/2017   Goals of care, counseling/discussion 01/21/2017   Physical debility 01/03/2017   Adenocarcinoma of right fallopian tube (HCC) 12/31/2016   Secondary malignant neoplasm of omentum (HCC)    Home Medication(s) Prior to Admission medications   Medication Sig Start Date End Date Taking? Authorizing Provider  albuterol (VENTOLIN HFA) 108 (90 Base) MCG/ACT inhaler Inhale 1-2 puffs into the lungs every 6 (six) hours as needed for wheezing or shortness of breath. 10/25/22  Yes Tanda Rockers A, DO  azithromycin (ZITHROMAX) 250 MG tablet Take 1 tablet (250 mg total) by mouth daily. Take first 2 tablets together, then 1 every day until finished. 10/25/22  Yes Tanda Rockers A, DO  guaiFENesin-codeine 100-10 MG/5ML syrup Take 5 mLs by mouth 3 (three) times daily as needed for cough. MAY MAKE YOU SLEEPY 10/25/22  Yes Tanda Rockers A, DO  predniSONE (STERAPRED UNI-PAK 21 TAB) 10 MG (21) TBPK tablet Take by mouth daily. Take 6 tabs by mouth daily  for 2 days, then 5 tabs for 2 days, then 4 tabs for 2 days, then 3 tabs for 2 days, 2 tabs for 2 days, then 1 tab by mouth daily for 2 days 10/25/22  Yes Tanda Rockers A, DO  RESTASIS 0.05 % ophthalmic emulsion Place 1 drop into both eyes 2 (two) times daily. 08/29/22  Yes [provider]  triamcinolone cream (KENALOG) 0.1 % Apply 1 Application topically 2 (two) times daily. 09/29/22  Yes [provider]  ALAWAY 0.035 % ophthalmic solution Place 1 drop into both eyes 2 (two)  times daily as needed (for irritation).    [provider]  atenolol (TENORMIN) 25 MG tablet Take 1 tablet (25 mg total) by mouth daily. 08/01/22   Artis Delay, MD  docusate sodium (COLACE) 100 MG capsule Take 100 mg by mouth daily as needed for mild constipation.    [provider]  lidocaine-prilocaine (EMLA) cream Apply to affected area once daily as directed. Patient taking differently: 1  application  daily as needed (as directed- to access port). 09/25/20   Artis Delay, MD  lisinopril (ZESTRIL) 20 MG tablet Take 1 tablet (20 mg total) by mouth daily. 10/15/22   Artis Delay, MD  MIRALAX 17 GM/SCOOP powder Take 17 g by mouth daily as needed for mild constipation.    [provider]  niraparib tosylate (ZEJULA) 100 MG tablet Take 1 tablet (100 mg total) by mouth daily. May take at bedtime to reduce nausea and vomiting. 09/12/22   Artis Delay, MD  ondansetron (ZOFRAN) 8 MG tablet Take 1 tablet (8 mg total) by mouth every 8 (eight) hours as needed for nausea or vomiting. Start on the third day after chemotherapy. 03/28/22   Artis Delay, MD  prochlorperazine (COMPAZINE) 10 MG tablet Take 1 tablet (10 mg total) by mouth every 6 (six) hours as needed for nausea or vomiting. 03/28/22   Artis Delay, MD  sodium chloride (OCEAN) 0.65 % SOLN nasal spray Place 1 spray into both nostrils as needed for congestion.    [provider]  TYLENOL 8 HOUR ARTHRITIS PAIN 650 MG CR tablet Take 650-1,300 mg by mouth every 8 (eight) hours as needed for pain.    [provider]  venlafaxine XR (EFFEXOR-XR) 37.5 MG 24 hr capsule TAKE 1 CAPSULE BY MOUTH DAILY WITH BREAKFAST. Patient taking differently: Take 37.5 mg by mouth daily with breakfast. 06/10/22   Artis Delay, MD                                                                                                                                    Past Surgical History Past Surgical History:  Procedure Laterality Date   ABDOMINAL HYSTERECTOMY  2018   DEBULKING N/A 12/26/2016   Procedure: RADICAL TUMOR DEBULKING; OMENTECTOMY;  Surgeon: Adolphus Birchwood, MD;  Location: [facility];  Service: Gynecology;  Laterality: N/A;   ganglion cyst removed     Hital Hernia     IR FLUORO GUIDE PORT INSERTION RIGHT  01/16/2017   IR IMAGING GUIDED PORT INSERTION  03/16/2019   IR REMOVAL TUN ACCESS W/ PORT W/O FL MOD SED  06/10/2017   IR US GUIDE VASC ACCESS  RIGHT  01/16/2017   LAPAROTOMY N/A 12/26/2016   Procedure: EXPLORATORY LAPAROTOMY;  Surgeon: Adolphus Birchwood, MD;  Location: [facility];  Service: Gynecology;  Laterality: N/A;   left morton's neuroma surgery      Nissan Fundoplication     Family History Family History  Problem Relation Age  of Onset   Diabetes Mother    Diabetes Father    Hypertension Father    Stroke Father    COPD Father    Heart attack Sister 89   Dementia Maternal Aunt    Bone cancer Paternal Uncle        deceased at 53   Dementia Maternal Grandmother    Heart attack Paternal Grandmother    Heart attack Paternal Grandfather    Healthy Son    Healthy Son     Social History Social History   Tobacco Use   Smoking status: Never    Passive exposure: Past   Smokeless tobacco: Never  Vaping Use   Vaping Use: Never used  Substance Use Topics   Alcohol use: No   Drug use: No   Allergies Dilaudid [hydromorphone hcl], Morphine and codeine, and Sudafed [pseudoephedrine hcl]  Review of Systems Review of Systems  Constitutional:  Positive for fatigue. Negative for activity change and fever.  HENT:  Negative for facial swelling and trouble swallowing.   Eyes:  Negative for discharge and redness.  Respiratory:  Positive for cough and shortness of breath.   Cardiovascular:  Negative for chest pain and palpitations.  Gastrointestinal:  Negative for abdominal pain and nausea.  Genitourinary:  Negative for dysuria and flank pain.  Musculoskeletal:  Negative for back pain and gait problem.  Skin:  Negative for pallor and rash.  Neurological:  Negative for syncope and headaches.    Physical Exam Vital Signs  I have reviewed the triage vital signs BP (!) 163/75   Pulse 63   Temp 98.5 F (36.9 C) (Oral)   Resp 18   SpO2 100%  Physical Exam Vitals and nursing note reviewed.  Constitutional:      General: She is not in acute distress.    Appearance: Normal appearance.  HENT:     Head: Normocephalic and  atraumatic.     Right Ear: External ear normal.     Left Ear: External ear normal.     Nose: Nose normal.     Mouth/Throat:     Mouth: Mucous membranes are moist.  Eyes:     General: No scleral icterus.       Right eye: No discharge.        Left eye: No discharge.  Cardiovascular:     Rate and Rhythm: Normal rate and regular rhythm.     Pulses: Normal pulses.     Heart sounds: Normal heart sounds.  Pulmonary:     Effort: Pulmonary effort is normal. No tachypnea or respiratory distress.     Breath sounds: Normal breath sounds. No stridor. No wheezing or rales.  Chest:     Comments: Chest wall port Abdominal:     General: Abdomen is flat.     Palpations: Abdomen is soft.     Tenderness: There is no abdominal tenderness. There is no guarding or rebound.  Musculoskeletal:     Right lower leg: No edema.     Left lower leg: No edema.  Skin:    General: Skin is warm and dry.     Capillary Refill: Capillary refill takes less than 2 seconds.  Neurological:     Mental Status: She is alert.  Psychiatric:        Mood and Affect: Mood normal.        Behavior: Behavior normal.     ED Results and Treatments Labs (all labs ordered are listed, but only abnormal results are displayed)  Labs Reviewed  CBC WITH DIFFERENTIAL/PLATELET - Abnormal; Notable for the following components:      Result Value   RBC 2.99 (*)    Hemoglobin 10.2 (*)    HCT 30.7 (*)    MCV 102.7 (*)    MCH 34.1 (*)    All other components within normal limits  BASIC METABOLIC PANEL - Abnormal; Notable for the following components:   Sodium 134 (*)    Glucose, Bld 136 (*)    Creatinine, Ser 1.04 (*)    GFR, Estimated 59 (*)    All other components within normal limits                                                                                                                          Radiology DG Chest 2 View  Result Date: 10/25/2022 CLINICAL DATA:  Cough. EXAM: CHEST - 2 VIEW COMPARISON:  X-ray 10/25/2022  FINDINGS: No consolidation, pneumothorax or effusion. Normal cardiopericardial silhouette. Subtle opacity at the right lung base is again noted. Stable right-sided chest port. Degenerative changes seen along the spine. IMPRESSION: Chest port. Subtle opacity right lung base is again seen. Recommend continued follow-up. Please correlate with the history of ovarian cancer. Electronically Signed   By: Karen Kays M.D.   On: 10/25/2022 14:33   DG Chest 1 View  Result Date: 10/25/2022 CLINICAL DATA:  1 week history of cough. EXAM: CHEST  1 VIEW COMPARISON:  06/17/2022 FINDINGS: Lungs are hyperexpanded. Interstitial markings are diffusely coarsened with chronic features. Subtle nodular density seen peripheral right lung base with some collapse/consolidative opacity at the left base. Right Port-A-Cath again noted. No acute bony abnormality. IMPRESSION: 1. Hyperexpansion with chronic interstitial coarsening. 2. Subtle nodular density peripheral right lung base with some collapse/consolidative opacity at the left base. No suspicious nodule or mass in the lung bases on recent chest CT of 09/10/2022. Consider dedicated follow-up nonportable two-view chest x-ray to further evaluate. Electronically Signed   By: Kennith Center M.D.   On: 10/25/2022 12:29    Pertinent labs & imaging results that were available during my care of the patient were reviewed by me and considered in my medical decision making (see MDM for details).  Medications Ordered in ED Medications  ipratropium-albuterol (DUONEB) 0.5-2.5 (3) MG/3ML nebulizer solution 3 mL (3 mLs Nebulization Given 10/25/22 1350)  guaiFENesin-codeine 100-10 MG/5ML solution 5 mL (5 mLs Oral Given 10/25/22 1346)  Procedures Procedures  (including critical care time)  Medical Decision Making / ED Course    Medical Decision Making:     DANESE BERNABEI is a 67 y.o. 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 The complaint involves an extensive differential diagnosis and also carries with it a high risk of complications and morbidity.  Serious etiology was considered. Ddx includes but is not limited to: Viral infection, bronchitis, pneumonia, CHF, neoplasm, etc.  Complete initial physical exam performed, notably the patient  was no acute distress, no hypoxia, she is coughing during exam..    Reviewed and confirmed nursing documentation for past medical history, family history, social history.  Vital signs reviewed.    Clinical Course as of 10/25/22 1516  Fri Oct 25, 2022  1247 Hemoglobin(!): 10.2 Similar to baseline [SG]    Clinical Course User Index [SG] Sloan Leiter, DO   Labs reviewed, patient stable. Will get 2 view xr Give DuoNeb, antitussive She is feeling much better on recheck.  X-ray shows pulmonary nodule, she has known history of this.  She follows with pulmonology Dr. Tonia Brooms.  Advised her to follow-up with them for further imaging and workup as needed Will send home with antitussive, steroids, albuterol mdi Query bronchitis No hypoxia or cp Feeling better Ambulatory w/ steady gait  The patient improved significantly and was discharged in stable condition. Detailed discussions were had with the patient regarding current findings, and need for close f/u with PCP or on call doctor. The patient has been instructed to return immediately if the symptoms worsen in any way for re-evaluation. Patient verbalized understanding and is in agreement with current care plan. All questions answered prior to discharge.       Additional history obtained: -Additional history obtained from spouse -External records from outside source obtained and reviewed  including: Chart review including previous notes, labs, imaging, consultation notes including primary care documentation, prior labs and imaging, medications   Lab Tests: -I ordered, reviewed, and interpreted labs.   The pertinent results include:   Labs Reviewed  CBC WITH DIFFERENTIAL/PLATELET - Abnormal; Notable for the following components:      Result Value   RBC 2.99 (*)    Hemoglobin 10.2 (*)    HCT 30.7 (*)    MCV 102.7 (*)    MCH 34.1 (*)    All other components within normal limits  BASIC METABOLIC PANEL - Abnormal; Notable for the following components:   Sodium 134 (*)    Glucose, Bld 136 (*)    Creatinine, Ser 1.04 (*)    GFR, Estimated 59 (*)    All other components within normal limits    Notable for stable, as above  EKG   EKG Interpretation Date/Time:    Ventricular Rate:    PR Interval:    QRS Duration:    QT Interval:    QTC Calculation:   R Axis:      Text Interpretation:           Imaging Studies ordered: I ordered imaging studies including cxr I independently visualized the following imaging with scope of interpretation limited to determining acute life threatening conditions related to emergency care; findings noted above, significant for scarring/nodule I independently visualized and interpreted imaging. I agree with the radiologist interpretation   Medicines ordered and prescription drug management: Meds ordered this encounter  Medications   ipratropium-albuterol (DUONEB) 0.5-2.5 (3) MG/3ML nebulizer solution 3 mL   guaiFENesin-codeine 100-10 MG/5ML solution 5 mL   guaiFENesin-codeine 100-10 MG/5ML syrup    Sig: Take 5 mLs by mouth 3 (three) times daily as needed for cough. MAY MAKE YOU SLEEPY    Dispense:  120 mL    Refill:  0   predniSONE (STERAPRED UNI-PAK 21 TAB) 10 MG (21) TBPK tablet    Sig: Take by mouth daily. Take 6 tabs by mouth daily  for 2 days, then 5 tabs for 2 days, then 4 tabs for 2 days, then 3 tabs for 2 days, 2 tabs  for 2 days, then 1 tab by mouth daily for 2 days    Dispense:  42 tablet    Refill:  0   albuterol (VENTOLIN HFA) 108 (90 Base) MCG/ACT inhaler    Sig: Inhale 1-2 puffs into the lungs every 6 (six) hours as needed for wheezing or shortness of breath.    Dispense:  1 each    Refill:  0   azithromycin (ZITHROMAX) 250 MG tablet    Sig: Take 1 tablet (250 mg total) by mouth daily. Take first 2 tablets together, then 1 every day until finished.    Dispense:  6 tablet    Refill:  0    -I have reviewed the patients home medicines and have made adjustments as needed   Consultations Obtained: na   Cardiac Monitoring: Continuous pulse oximetry interpreted by myself 99 to 100% pulse ox on room air  Social Determinants of Health:  Diagnosis or treatment significantly limited by social determinants of health: Non-smoker   Reevaluation: After the interventions noted above, I reevaluated the patient and found that they have improved  Co morbidities that complicate the patient evaluation  Past Medical History:  Diagnosis Date   Complication of anesthesia    slow to wake up sometimes    GERD (gastroesophageal reflux disease)    History of chemotherapy    History of hiatal hernia    Hypertension    Ovarian cancer (HCC) dx'd 12/2016   recurrent dz 02/2019   PONV (postoperative nausea and vomiting)       Dispostion: Disposition decision including need for hospitalization was considered, and patient discharged from emergency department.    Final Clinical Impression(s) / ED Diagnoses Final diagnoses:  Subacute cough  Pulmonary nodule     This chart was dictated using voice recognition software.  Despite best efforts to proofread,  errors can occur which can change the documentation meaning.    Sloan Leiter, DO 10/25/22 [phone]

## 2022-10-25 NOTE — Discharge Instructions (Addendum)
It was a pleasure caring for you today in the emergency department.  Please return to the emergency department for any worsening or worrisome symptoms.  Please follow-up with pulmonologist and with your primary care doctor.

## 2022-10-25 NOTE — ED Triage Notes (Addendum)
Pt presents to ED Pov. Pt c/o cough x1w. Pt reports that cough is mildly productive. Denies fever at home. Pt still taking chemo for cancer treatment

## 2022-10-28 ENCOUNTER — Encounter: Payer: Self-pay | Admitting: Hematology and Oncology

## 2022-11-01 ENCOUNTER — Other Ambulatory Visit: Payer: Self-pay | Admitting: Hematology and Oncology

## 2022-12-02 ENCOUNTER — Other Ambulatory Visit: Payer: Self-pay

## 2022-12-02 ENCOUNTER — Inpatient Hospital Stay (HOSPITAL_BASED_OUTPATIENT_CLINIC_OR_DEPARTMENT_OTHER): Payer: Medicare Other | Admitting: Hematology and Oncology

## 2022-12-02 ENCOUNTER — Inpatient Hospital Stay: Payer: Medicare Other | Attending: Hematology and Oncology

## 2022-12-02 ENCOUNTER — Encounter: Payer: Self-pay | Admitting: Hematology and Oncology

## 2022-12-02 VITALS — BP 146/61 | HR 73 | Resp 18 | Ht 65.0 in | Wt 136.4 lb

## 2022-12-02 DIAGNOSIS — N390 Urinary tract infection, site not specified: Secondary | ICD-10-CM | POA: Diagnosis not present

## 2022-12-02 DIAGNOSIS — C7989 Secondary malignant neoplasm of other specified sites: Secondary | ICD-10-CM | POA: Insufficient documentation

## 2022-12-02 DIAGNOSIS — D6481 Anemia due to antineoplastic chemotherapy: Secondary | ICD-10-CM | POA: Insufficient documentation

## 2022-12-02 DIAGNOSIS — I10 Essential (primary) hypertension: Secondary | ICD-10-CM

## 2022-12-02 DIAGNOSIS — C786 Secondary malignant neoplasm of retroperitoneum and peritoneum: Secondary | ICD-10-CM | POA: Diagnosis not present

## 2022-12-02 DIAGNOSIS — R300 Dysuria: Secondary | ICD-10-CM

## 2022-12-02 DIAGNOSIS — C5701 Malignant neoplasm of right fallopian tube: Secondary | ICD-10-CM

## 2022-12-02 DIAGNOSIS — J984 Other disorders of lung: Secondary | ICD-10-CM

## 2022-12-02 DIAGNOSIS — Z7189 Other specified counseling: Secondary | ICD-10-CM

## 2022-12-02 DIAGNOSIS — T451X5A Adverse effect of antineoplastic and immunosuppressive drugs, initial encounter: Secondary | ICD-10-CM

## 2022-12-02 LAB — URINALYSIS, COMPLETE (UACMP) WITH MICROSCOPIC
Bilirubin Urine: NEGATIVE
Glucose, UA: NEGATIVE mg/dL
Ketones, ur: NEGATIVE mg/dL
Nitrite: NEGATIVE
Protein, ur: NEGATIVE mg/dL
Specific Gravity, Urine: 1.01 (ref 1.005–1.030)
WBC, UA: >50 WBC/hpf (ref 0–5)
pH: 5 (ref 5.0–8.0)

## 2022-12-02 LAB — CBC WITH DIFFERENTIAL/PLATELET
Abs Immature Granulocytes: 0.03 10*3/uL (ref 0.00–0.07)
Basophils Absolute: 0.1 10*3/uL (ref 0.0–0.1)
Basophils Relative: 1 %
Eosinophils Absolute: 0.3 10*3/uL (ref 0.0–0.5)
Eosinophils Relative: 4 %
HCT: 34 % — ABNORMAL LOW (ref 36.0–46.0)
Hemoglobin: 11.4 g/dL — ABNORMAL LOW (ref 12.0–15.0)
Immature Granulocytes: 0 %
Lymphocytes Relative: 18 %
Lymphs Abs: 1.3 10*3/uL (ref 0.7–4.0)
MCH: 33.9 pg (ref 26.0–34.0)
MCHC: 33.5 g/dL (ref 30.0–36.0)
MCV: 101.2 fL — ABNORMAL HIGH (ref 80.0–100.0)
Monocytes Absolute: 0.6 10*3/uL (ref 0.1–1.0)
Monocytes Relative: 8 %
Neutro Abs: 4.9 10*3/uL (ref 1.7–7.7)
Neutrophils Relative %: 69 %
Platelets: 181 10*3/uL (ref 150–400)
RBC: 3.36 MIL/uL — ABNORMAL LOW (ref 3.87–5.11)
RDW: 13.5 % (ref 11.5–15.5)
WBC: 7.2 10*3/uL (ref 4.0–10.5)
nRBC: 0 % (ref 0.0–0.2)

## 2022-12-02 LAB — COMPREHENSIVE METABOLIC PANEL
ALT: 6 U/L (ref 0–44)
AST: 15 U/L (ref 15–41)
Albumin: 3.7 g/dL (ref 3.5–5.0)
Alkaline Phosphatase: 63 U/L (ref 38–126)
Anion gap: 7 (ref 5–15)
BUN: 19 mg/dL (ref 8–23)
CO2: 26 mmol/L (ref 22–32)
Calcium: 9 mg/dL (ref 8.9–10.3)
Chloride: 107 mmol/L (ref 98–111)
Creatinine, Ser: 1.45 mg/dL — ABNORMAL HIGH (ref 0.44–1.00)
GFR, Estimated: 39 mL/min — ABNORMAL LOW (ref >60)
Glucose, Bld: 97 mg/dL (ref 70–99)
Potassium: 4.3 mmol/L (ref 3.5–5.1)
Sodium: 140 mmol/L (ref 135–145)
Total Bilirubin: 0.5 mg/dL (ref 0.3–1.2)
Total Protein: 6.2 g/dL — ABNORMAL LOW (ref 6.5–8.1)

## 2022-12-02 MED ORDER — SODIUM CHLORIDE 0.9% FLUSH
10.0000 mL | Freq: Once | INTRAVENOUS | Status: AC
  Administered 2022-12-02: 10 mL

## 2022-12-02 MED ORDER — HEPARIN SOD (PORK) LOCK FLUSH 100 UNIT/ML IV SOLN
500.0000 [IU] | Freq: Once | INTRAVENOUS | Status: AC
  Administered 2022-12-02: 500 [IU]

## 2022-12-02 NOTE — Assessment & Plan Note (Signed)
Her blood pressure is stable She has intermittent elevated blood pressure but she is not symptomatic She will continue atenolol in the morning and I recommend lisinopril in the evening She will continue to check her blood pressure daily

## 2022-12-02 NOTE — Assessment & Plan Note (Signed)
This is likely due to recent treatment. The patient denies recent history of bleeding such as epistaxis, hematuria or hematochezia. She is asymptomatic from the anemia. I will observe for now.  She does not require transfusion now. I will continue the chemotherapy at current dose without dosage adjustment.  If the anemia gets progressive worse in the future, I might have to delay her treatment or adjust the chemotherapy dose.  

## 2022-12-02 NOTE — Assessment & Plan Note (Signed)
She is known to have abnormal CT imaging of her lung She is now asymptomatic after her course of antibiotics given by the emergency room physician last month

## 2022-12-02 NOTE — Progress Notes (Signed)
[facility] OFFICE PROGRESS NOTE  Patient Care Team: Arabella Merles, CNM as PCP - General (Obstetrics and Gynecology) Artis Delay, MD as Consulting Physician (Hematology and Oncology) Adolphus Birchwood, MD as Consulting Physician (Obstetrics and Gynecology)  ASSESSMENT & PLAN:  Adenocarcinoma of right fallopian tube (HCC) Overall, he tolerated niraparib well She has no side effects and she feels well Plan to order imaging study next month for objective assessment of response to therapy  Cavitary lesion of lung She is known to have abnormal CT imaging of her lung She is now asymptomatic after her course of antibiotics given by the emergency room physician last month  Essential hypertension Her blood pressure is stable She has intermittent elevated blood pressure but she is not symptomatic She will continue atenolol in the morning and I recommend lisinopril in the evening She will continue to check her blood pressure daily  Anemia due to antineoplastic chemotherapy This is likely due to recent treatment. The patient denies recent history of bleeding such as epistaxis, hematuria or hematochezia. She is asymptomatic from the anemia. I will observe for now.  She does not require transfusion now. I will continue the chemotherapy at current dose without dosage adjustment.  If the anemia gets progressive worse in the future, I might have to delay her treatment or adjust the chemotherapy dose.   Orders Placed This Encounter  Procedures   CT CHEST ABDOMEN PELVIS W CONTRAST    Standing Status:   Future    Standing Expiration Date:   12/02/2023    Order Specific Question:   If indicated for the ordered procedure, I authorize the administration of contrast media per Radiology protocol    Answer:   Yes    Order Specific Question:   Does the patient have a contrast media/X-ray dye allergy?    Answer:   No    Order Specific Question:   Preferred imaging location?    Answer:   [facility]    Order Specific Question:   If indicated for the ordered procedure, I authorize the administration of oral contrast media per Radiology protocol    Answer:   Yes    All questions were answered. The patient knows to call the clinic with any problems, questions or concerns. The total time spent in the appointment was 30 minutes encounter with patients including review of chart and various tests results, discussions about plan of care and coordination of care plan   Artis Delay, MD 12/02/2022 10:06 AM  INTERVAL HISTORY: Please see below for problem oriented charting. she returns for treatment follow-up with her husband She tolerated niraparib well Her documented blood pressure from home show intermittent elevated blood pressure from time to time but she is not symptomatic She went to the emergency department a month ago for shortness of breath and cough and was prescribed antibiotics and steroid She is now completely asymptomatic We reviewed test results and discussed timing of next imaging  REVIEW OF SYSTEMS:   Constitutional: Denies fevers, chills or abnormal weight loss Eyes: Denies blurriness of vision Ears, nose, mouth, throat, and face: Denies mucositis or sore throat Respiratory: Denies cough, dyspnea or wheezes Cardiovascular: Denies palpitation, chest discomfort or lower extremity swelling Gastrointestinal:  Denies nausea, heartburn or change in bowel habits Skin: Denies abnormal skin rashes Lymphatics: Denies new lymphadenopathy or easy bruising Neurological:Denies numbness, tingling or new weaknesses Behavioral/Psych: Mood is stable, no new changes  All other systems were reviewed with the patient and are negative.  I have reviewed the past medical history, past surgical history, social history and family history with the patient and they are unchanged from previous note.  ALLERGIES:  is allergic to dilaudid [hydromorphone hcl], morphine and codeine, and sudafed  [pseudoephedrine hcl].  MEDICATIONS:  Current Outpatient Medications  Medication Sig Dispense Refill   ALAWAY 0.035 % ophthalmic solution Place 1 drop into both eyes 2 (two) times daily as needed (for irritation).     atenolol (TENORMIN) 25 MG tablet Take 1 tablet (25 mg total) by mouth daily. 30 tablet 2   docusate sodium (COLACE) 100 MG capsule Take 100 mg by mouth daily as needed for mild constipation.     lidocaine-prilocaine (EMLA) cream Apply to affected area once daily as directed. (Patient taking differently: 1 application  daily as needed (as directed- to access port).) 30 g 3   lisinopril (ZESTRIL) 20 MG tablet TAKE 1 TABLET BY MOUTH TWICE A DAY 60 tablet 1   MIRALAX 17 GM/SCOOP powder Take 17 g by mouth daily as needed for mild constipation.     niraparib tosylate (ZEJULA) 100 MG tablet Take 1 tablet (100 mg total) by mouth daily. May take at bedtime to reduce nausea and vomiting. 30 tablet 3   ondansetron (ZOFRAN) 8 MG tablet Take 1 tablet (8 mg total) by mouth every 8 (eight) hours as needed for nausea or vomiting. Start on the third day after chemotherapy. 30 tablet 1   prochlorperazine (COMPAZINE) 10 MG tablet Take 1 tablet (10 mg total) by mouth every 6 (six) hours as needed for nausea or vomiting. 30 tablet 1   RESTASIS 0.05 % ophthalmic emulsion Place 1 drop into both eyes 2 (two) times daily.     sodium chloride (OCEAN) 0.65 % SOLN nasal spray Place 1 spray into both nostrils as needed for congestion.     triamcinolone cream (KENALOG) 0.1 % Apply 1 Application topically 2 (two) times daily.     TYLENOL 8 HOUR ARTHRITIS PAIN 650 MG CR tablet Take 650-1,300 mg by mouth every 8 (eight) hours as needed for pain.     venlafaxine XR (EFFEXOR-XR) 37.5 MG 24 hr capsule TAKE 1 CAPSULE BY MOUTH DAILY WITH BREAKFAST. (Patient taking differently: Take 37.5 mg by mouth daily with breakfast.) 90 capsule 3   No current facility-administered medications for this visit.    SUMMARY OF  ONCOLOGIC HISTORY: Oncology History Overview Note  Neg genetics High grade serous    Adenocarcinoma of right fallopian tube (HCC)  08/12/2016 Initial Diagnosis   The patient has many months of vague symptoms of fullness in the pelvis, urinary frequency and difficulty with defecation. She denies vaginal bleeding or rectal bleeding.     08/12/2016 Imaging   Abdomen X-ray in the ER: Moderate colonic stool burden without evidence of enteric obstruction   11/13/2016 Imaging   TVUS was performed on 11/13/16 which showed a large hypoechoic lobular mass seen posterior to the uterus measuring 18.2x16.1x11.8cm, blood flow was seen along the periphery of the mass. It was considered to be likely to be a fibroid vs pelvic mass vs ovarian mass. Neither ovary was removed. Moderate amount of free fluid in the pelvis. THe uterus measures 4x10x4cm. The endometrium is 4mm.    12/05/2016 Imaging   MR pelvis 1. Mild limitations as detailed above. 2. Heterogeneous pelvic mass is favored to arise from the right ovary. Favor a solid ovarian neoplasm such as fibroma/Brenner's tumor. Given lesion size and heterogeneous T2 signal out, complicating torsion cannot be  excluded. 3. Moderate abdominopelvic ascites, without specific evidence of peritoneal metastasis. Consider further evaluation with contrast-enhanced abdominopelvic CT.    12/26/2016 Pathology Results   1. Uterus, ovaries and fallopian tubes - ADENOCARCINOMA INVOLVING RIGHT FALLOPIAN TUBE, RIGHT AND LEFT OVARIES, UTERINE SEROSA AND PELVIC MASS. - SEE ONCOLOGY TABLE AND COMMENT. - UTERINE CERVIX, ENDOMETRIUM, MYOMETRIUM AND LEFT FALLOPIAN TUBE FREE OF TUMOR. 2. Omentum, resection for tumor - ADENOCARCINOMA. Microscopic Comment 1. ONCOLOGY TABLE - FALLOPIAN TUBE. SEE COMMENT 1. Specimen, including laterality: Uterus, bilateral adnexa, pelvic mass and omentum. 2. Procedure: Hysterectomy with bilateral salpingo-oophorectomy, pelvic mass excision and  omentectomy. 3. Lymph node sampling performed: No 4. Tumor site: See comment. 5. Tumor location in fallopian tube: Right fallopian tube fimbria 6. Specimen integrity (intact/ruptured/disrupted): Intact 7. Tumor size (cm): 18 cm, see comment. 8. Histologic type: Adenocarcinoma 9. Grade: High grade 10. Microscopic tumor extension: Tumor involves right fallopian tube, right and left ovaries, uterine serosa, pelvic mass and omentum. 11. Margins: See comment. 12. Lymph-Vascular invasion: Present 13. Lymph nodes: # examined: 0; # positive: N/A 14. TNM: pT3c, pNX 15. FIGO Stage (based on pathologic findings, needs clinical correlation: III-C 16. Comments: There is an 18 cm pelvic mass which is a high grade adenocarcinoma and there is a 12.2 cm  segment of omentum which is extensively involved with adenocarcinoma. The tumor also involves the fimbria of the right fallopian tube and the parenchyma of the right and left ovaries as well as uterine serosa. The fimbria of the right fallopian has intraepithelial atypia consistent with a precursor lesion and therefore this is most consistent with primary fallopian tube adenocarcinoma. A primary peritoneal serous carcinoma is a less likely possibility.    12/26/2016 Pathology Results   PERITONEAL/ASCITIC FLUID(SPECIMEN 1 OF 1 COLLECTED 12/26/16): POORLY DIFFERENTIATED ADENOCARCINOMA   12/26/2016 Surgery   Procedure(s) Performed: Exploratory laparotomy with total abdominal hysterectomy, bilateral salpingo-oophorectomy, omentectomy radical tumor debulking for ovarian cancer .   Surgeon: Luisa Dago, MD.      Operative Findings: 20cm left ovarian mass densely adherent to the posterior uterus, right tube and ovary, cervix, sigmoid colon. 10cm omental cake. 4L ascites. 1mm size tumor nodules on the serosa of the terminal ileum and proximal sigmoid colon. 1mm nodules on right diaphragm.    This represented an optimal cytoreduction (R1) with 1mm nodules on intestine  and diaphragm representing gross visible disease   01/16/2017 Procedure   Placement of single lumen port a cath via right internal jugular vein. The catheter tip lies at the cavo-atrial junction. A power injectable port a cath was placed and is ready for immediate use.   01/17/2017 Imaging   1. 3.5 x 7.2 x 4.6 cm fluid collection along the vaginal cuff, posterior the bladder and extending into the right adnexal space. This lesion demonstrates rim enhancement. Imaging features could be related to a loculated postoperative seroma or hematoma. Superinfection cannot be excluded by CT. Given debulking surgery was 3 weeks ago, this entire structure is un likely to represent neoplasm, but peritoneal involvement could have this appearance. 2. Small fluid collection in the left para colic gutter without rim enhancement. 3. Irregular/nodular appearance of the peritoneal M in the anatomic pelvis with areas of subtle nodularity between the stomach and the spleen. Close attention in these regions on follow-up recommended as metastatic disease is a concern. 4. Mildly enlarged hepatoduodenal ligament lymph node. Attention on follow-up recommended.   01/20/2017 Tumor Marker   Patient's tumor was tested for the following markers: CA-125 Results  of the tumor marker test revealed 46.6   01/28/2017 Genetic Testing       Negative genetic testing on the [facility] panel.  The [facility] gene panel offered by [employer] includes sequencing and deletion/duplication testing of the following 28 genes: APC, ATM, BARD1, BMPR1A, BRCA1, BRCA2, BRIP1, CHD1, CDK4, CDKN2A, CHEK2, EPCAM (large rearrangement only), MLH1, MSH2, MSH6, MUTYH, NBN, PALB2, PMS2, PTEN, RAD51C, RAD51D, SMAD4, STK11, and TP53. Sequencing was performed for select regions of POLE and POLD1, and large rearrangement analysis was performed for select regions of GREM1. The report date is January 28, 2017.  HRD testing looking for genomic instability  and BRCA mutations was negative.  The report date of this test is January 27, 2017.    01/31/2017 Tumor Marker   Patient's tumor was tested for the following markers: CA-125 Results of the tumor marker test revealed 22.4   03/04/2017 Tumor Marker   Patient's tumor was tested for the following markers: CA-125 Results of the tumor marker test revealed 13.8   04/18/2017 Tumor Marker   Patient's tumor was tested for the following markers: CA-125 Results of the tumor marker test revealed 11.9   05/05/2017 Tumor Marker   Patient's tumor was tested for the following markers: CA-125 Results of the tumor marker test revealed 12   05/26/2017 Tumor Marker   Patient's tumor was tested for the following markers: CA-125 Results of the tumor marker test revealed 10.6   05/26/2017 Imaging   1. A previously noted postoperative fluid collection in the low pelvis and residual ascites seen on the prior examination has resolved on today's study. No findings to suggest residual/recurrent disease on today's examination. No definite solid organ metastasis identified in the abdomen or pelvis. No lymphadenopathy. 2. Aortic atherosclerosis.   06/10/2017 Procedure   Successful right IJ vein Port-A-Cath explant.   03/09/2018 Tumor Marker   Patient's tumor was tested for the following markers: CA-125 Results of the tumor marker test revealed 12.1   05/26/2018 Tumor Marker   Patient's tumor was tested for the following markers: CA-125 Results of the tumor marker test revealed 13.8   09/09/2018 Tumor Marker   Patient's tumor was tested for the following markers: CA-125 Results of the tumor marker test revealed 23.9   12/18/2018 Tumor Marker   Patient's tumor was tested for the following markers: CA-125 Results of the tumor marker test revealed 43.8   02/27/2019 - 02/28/2019 Hospital Admission   She was admitted for bowel obstruction   02/27/2019 Imaging   1. High-grade small bowel obstruction with transition  point in the pelvis in an area of suspected desmoplastic reaction surrounding a 1.3 cm spiculated nodule in the mesentery, suspicious for carcinoid. There is hyperenhancement near the tip of the appendix and loss of the normal fat plane between it and the adjacent sigmoid colon, potentially the site of primary lesion. 2. Multiple new small subcentimeter hyperdense lesions scattered along the liver capsule, concerning for metastases. Enlarged hyperenhancing gastrohepatic and portacaval lymph nodes concerning for nodal metastases. 3. Very mild right hydroureteronephrosis to the level of the desmoplastic reaction in the pelvis, potentially involving the right ureter. 4. Infraumbilical ventral abdominal wall diastasis containing a small nondilated portion of transverse colon. 5. Trace ascites. 6. Cholelithiasis. 7.  Aortic atherosclerosis (ICD10-I70.0).     03/05/2019 Tumor Marker   Patient's tumor was tested for the following markers: CA-125 Results of the tumor marker test revealed 70.1   03/12/2019 Echocardiogram   IMPRESSIONS     1.  Left ventricular ejection fraction, by visual estimation, is 60 to 65%. The left ventricle has normal function. There is no left ventricular hypertrophy.  2. Left ventricular diastolic parameters are consistent with Grade I diastolic dysfunction (impaired relaxation).  3. Global right ventricle has normal systolic function.The right ventricular size is normal. No increase in right ventricular wall thickness.  4. Left atrial size was normal.  5. Right atrial size was normal.  6. The pericardium was not assessed.  7. The mitral valve is normal in structure. No evidence of mitral valve regurgitation.  8. The tricuspid valve is normal in structure. Tricuspid valve regurgitation is trivial.  9. The aortic valve is normal in structure. Aortic valve regurgitation is not visualized. 10. The pulmonic valve was grossly normal. Pulmonic valve regurgitation is not  visualized. 11. The average left ventricular global longitudinal strain is -17.6 %.   03/16/2019 Procedure   Successful placement of a right internal jugular approach power injectable Port-A-Cath. The catheter is ready for immediate use.     04/19/2019 Tumor Marker   Patient's tumor was tested for the following markers: CA-125 Results of the tumor marker test revealed 39.8   05/17/2019 Tumor Marker   Patient's tumor was tested for the following markers: CA-125 Results of the tumor marker test revealed 27   05/28/2019 Tumor Marker   Patient's tumor was tested for the following markers: CA-125 Results of the tumor marker test revealed 27.7.   06/11/2019 Imaging   1. No new or progressive metastatic disease in the abdomen or pelvis. 2. Tiny noncalcified perisplenic implant is decreased. Calcified pericardiophrenic, retroperitoneal and bilateral inguinal lymph nodes and scattered small calcified perihepatic and perisplenic implants are stable. 3.  Aortic Atherosclerosis (ICD10-I70.0).       06/17/2019 Echocardiogram    1. Left ventricular ejection fraction, by estimation, is 65 to 70%. The left ventricle has normal function. The left ventricle has no regional wall motion abnormalities. Left ventricular diastolic parameters were normal.  2. Right ventricular systolic function is normal. The right ventricular size is normal.  3. The mitral valve is normal in structure and function. No evidence of mitral valve regurgitation. No evidence of mitral stenosis.  4. The aortic valve is normal in structure and function. Aortic valve regurgitation is not visualized. No aortic stenosis is present.   06/21/2019 Tumor Marker   Patient's tumor was tested for the following markers: CA-125 Results of the tumor marker test revealed 18.7   07/19/2019 Tumor Marker   Patient's tumor was tested for the following markers: CA-125 Results of the tumor marker test revealed 16.7   08/30/2019 Tumor Marker    Patient's tumor was tested for the following markers: CA-125. Results of the tumor marker test revealed 13.9   09/15/2019 Echocardiogram    1. Left ventricular ejection fraction, by estimation, is 65 to 70%. The left ventricle has normal function. The left ventricle has no regional wall motion abnormalities. Left ventricular diastolic parameters are consistent with Grade I diastolic dysfunction (impaired relaxation). The average left ventricular global longitudinal strain is 18.1 %. The global longitudinal strain is normal.  2. Right ventricular systolic function is normal. The right ventricular size is normal.  3. The mitral valve is normal in structure. No evidence of mitral valve regurgitation. No evidence of mitral stenosis.  4. The aortic valve is normal in structure. Aortic valve regurgitation is not visualized. No aortic stenosis is present.  5. The inferior vena cava is normal in size with greater than 50% respiratory  variability, suggesting right atrial pressure of 3 mmHg.     09/24/2019 Imaging   1. Stable exam. No new or progressive metastatic disease on today's study. 2. Small subcapsular hypodensity in the lateral spleen, new in the interval, but indeterminate. Attention on follow-up recommended. 3. The calcified upper abdominal and groin lymph nodes are stable as are the scattered calcified and noncalcified peritoneal implants. 4. Stable midline ventral hernia containing a short segment of colon without complicating features. 5. Aortic Atherosclerosis (ICD10-I70.0).     10/04/2019 - 09/21/2021 Chemotherapy   Patient is on Treatment Plan : ovarian Bevacizumab q21d     10/04/2019 Tumor Marker   Patient's tumor was tested for the following markers: CA-125. Results of the tumor marker test revealed 12.9.   11/01/2019 Tumor Marker   Patient's tumor was tested for the following markers: CA-125 Results of the tumor marker test revealed 15.4   12/13/2019 Tumor Marker   Patient's tumor was  tested for the following markers: CA-125 Results of the tumor marker test revealed 14.8   12/31/2019 Imaging   1. Unchanged tiny peritoneal nodule adjacent to the spleen measuring 6 mm. Other previously noted peritoneal nodules are imperceptible on present examination. 2. Unchanged prominent, partially calcified portacaval lymph node and bilateral inguinal lymph nodes. 3. Unchanged subtle, nonspecific subcapsular lesion of the spleen.  4. No evidence of new metastatic disease in the abdomen or pelvis. 5. Status post hysterectomy. 6. Unchanged low midline ventral abdominal hernia containing a single loop of nonobstructed transverse colon. 7. Cholelithiasis. 8. Aortic Atherosclerosis (ICD10-I70.0).       01/03/2020 Tumor Marker   Patient's tumor was tested for the following markers: CA-125 Results of the tumor marker test revealed 11.7.   01/24/2020 Tumor Marker   Patient's tumor was tested for the following markers: CA-125. Results of the tumor marker test revealed 15.1   03/06/2020 Tumor Marker   Patient's tumor was tested for the following markers: CA-125. Results of the tumor marker test revealed 20.3   03/27/2020 Tumor Marker   Patient's tumor was tested for the following markers: CA-125 Results of the tumor marker test revealed 23.7   05/11/2020 Tumor Marker   Patient's tumor was tested for the following markers: CA-125. Results of the tumor marker test revealed 25.4   06/23/2020 Imaging   1. Cholelithiasis with gallbladder wall thickening and mild infiltrative edema in the porta hepatis, raising the possibility of acute cholecystitis. There is truncation of the common bile duct distally and distal choledocholithiasis is difficult to exclude. Correlate with bilirubin levels. If clinically warranted, MRCP could be utilized for further characterization. 2. Stable tiny perisplenic nodule. 3. Other imaging findings of potential clinical significance: Small type 1 hiatal hernia.  Prominent stool throughout the colon favors constipation. Ventral infraumbilical hernia contains transverse colon without findings of strangulation or obstruction. Small supraumbilical hernia contains adipose tissue. Lumbar degenerative disc disease at L5-S1. Stable tiny nodule along the lateral margin of the spleen, nonspecific. 4. Aortic atherosclerosis.     12/15/2020 Imaging   No evidence of recurrent or metastatic carcinoma within the abdomen or pelvis.   Stable ventral hernia containing transverse colon. No evidence of bowel obstruction or strangulation.   Cholelithiasis. No radiographic evidence of cholecystitis.   Tiny hiatal hernia.   Aortic Atherosclerosis (ICD10-I70.0).     06/11/2021 Imaging   1. Numerous sub 4 mm pulmonary nodules in the lungs some of which have a cavitary appearance. I think it is unlikely this is metastatic disease and more likely  a possible immunotherapy related process such as a sarcoid like reaction. Recommend full chest CT further evaluation. 2. Stable small calcified retroperitoneal and mesenteric lymph nodes. No findings suspicious for recurrent peritoneal carcinomatosis. 3. Stable lower abdominal wall hernia containing part of the transverse colon. 4. Cholelithiasis.   09/27/2021 Imaging   1. Numerous small pulmonary nodules throughout the lungs, the majority of which are cavitary. These are all subcentimeter, however nodules that were seen in the bilateral lung bases on prior CT dated 06/08/2021 appear slightly increased in size and number. Behavior is highly suspicious for pulmonary metastatic disease despite the unusual appearance, however as previously reported general differential considerations include atypical infection and inflammation, such as drug-induced sarcoidosis like reaction.  2. Unchanged calcified lymph nodes in the abdomen and pelvis, consistent with treated nodal metastases. 3. Cholelithiasis with wall thickening and pericholecystic  fluid. This appearance is similar to prior examination and suggests chronic cholecystitis. Correlate for clinical symptoms. Mild intrahepatic biliary ductal dilatation, similar to prior examination. 4. Midline ventral hernia containing a single nonobstructed loop of mid transverse colon.     12/25/2021 Imaging   1. Again noted are multiple thin walled cavitary lung nodules throughout both lungs. These are similar in size and multiplicity when compared with the exam from 09/26/2021. Etiology remains indeterminate, although metastatic disease cannot be excluded with a high degree of certainty. 2. Stable calcified upper abdominal lymph nodes consistent with treated nodal metastases. 3. Gallstones. 4. Aortic Atherosclerosis (ICD10-I70.0).   01/30/2022 Tumor Marker   Patient's tumor was tested for the following markers: CA-125. Results of the tumor marker test revealed 59.3.   01/31/2022 Imaging   1. No substantial interval change in exam. 2. Innumerable tiny cavitary pulmonary nodules in the lung bases. As noted previously, metastatic disease would be distinct consideration although imaging features are nonspecific and infectious/inflammatory etiology is not excluded. 3. Calcified nodal disease in the upper abdomen and pelvis shows no substantial interval change. Scattered noncalcified nodules in the mesentery are similar to prior. 4. Markedly large stool volume throughout the colon. Imaging features suggestive of clinical constipation. 5. Cholelithiasis with ill-defined appearance of the gallbladder wall. Appearance is stable in the interval. 6. Trace free fluid in the cul-de-sac. 7. Midline ventral hernia contains a short segment of transverse colon without complicating features. 8. Aortic Atherosclerosis (ICD10-I70.0).   03/07/2022 Surgery   Procedure Date:  03/07/2022  Pre-operative Diagnosis:  Symptomatic cholelithiasis   Post-operative Diagnosis: Symptomatic cholelithiasis, peritoneal  implants with history of right fallopian tube cancer.   Procedure:   Robotic assisted cholecystectomy with ICG FireFly cholangiogram Robotic assisted abdominal wall peritoneal implant excisional biopsy x 3.   Surgeon:  Howie Ill, MD  Specimens:   Gallbladder Peritoneal implants x 3    Indications for Procedure:  This is a 67 y.o. female who presents with abdominal pain and workup revealing symptomatic cholelithiasis.  The benefits, complications, treatment options, and expected outcomes were discussed with the patient. The risks of bleeding, infection, recurrence of symptoms, failure to resolve symptoms, bile duct damage, bile duct leak, retained common bile duct stone, bowel injury, and need for further procedures were all discussed with the patient and she was willing to proceed.     03/07/2022 Pathology Results   SURGICAL PATHOLOGY  CASE: [phone]  PATIENT: Holly [name]  Surgical Pathology Report   Specimen Submitted:  A. Gallbladder  B. Abdominal wall   Clinical History: Symptomatic cholelithiasis   DIAGNOSIS:  A. GALLBLADDER; CHOLECYSTECTOMY:  - CHRONIC CHOLECYSTITIS WITH  CHOLELITHIASIS.  - NEGATIVE FOR DYSPLASIA AND MALIGNANCY.  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   B. SOFT TISSUE, ABDOMINAL WALL; BIOPSY:  - POSITIVE FOR MALIGNANCY.  - METASTATIC CARCINOMA, COMPATIBLE WITH HIGH-GRADE SEROUS CARCINOMA OF GYNECOLOGIC PRIMARY.   Comment:  Immunohistochemical studies were performed on neoplastic cells in both sources. Tumor cells in both are positive for CK7, Pax-8, and WT-1, supporting the above diagnosis. P53 demonstrates strong and diffuse expression, indicative of mutated expression pattern.   There is sufficient tissue present for ancillary molecular testing if desired.    04/04/2022 Imaging   1. Mild progression of partially calcified peritoneal metastatic implants  surrounding the liver, in the porta hepatis and in the periumbilical region. No generalized ascites or peritoneal nodularity status post omentectomy. 2. Grossly stable cavitary lesions at both lung bases, presumably reflecting metastatic disease. 3. No other evidence of progressive metastatic disease within the abdomen or pelvis. 4. Enlarging ventral hernia containing a portion of the transverse colon. No evidence of bowel obstruction or inflammation. 5.  Aortic Atherosclerosis (ICD10-I70.0).   04/08/2022 - 08/31/2022 Chemotherapy   Patient is on Treatment Plan : OVARIAN Carboplatin + Paclitaxel + Bevacizumab q21d      04/09/2022 Tumor Marker   Patient's tumor was tested for the following markers: CA-125. Results of the tumor marker test revealed 86.7.   05/22/2022 Tumor Marker   Patient's tumor was tested for the following markers: CA-125. Results of the tumor marker test revealed 49.7.   06/05/2022 Imaging   1. New tiny bilateral pleural effusions. 2. Once again there are several small cavitary lesions in the lungs. Many of these are becoming more cavitary with less soft tissue elements and are smaller today. 3. Soft tissue thickening and nodularity along the surface of the liver appears to be overall decreasing from previous examination. Some of these areas have calcification. 4. Mesenteric nodules as well as retroperitoneal and mesenteric nodes appear similar to slightly improved. The inguinal nodes seen previously are similar. 5. No developing new mass lesion, ascites or lymph node enlargement seen. 6. Of interest the density of the lumen of the renal pelvis bilaterally is increased on the left compared to the right in the portal venous phase. This is of uncertain etiology and significance. Please correlate with the urinalysis such as for hematuria.   09/02/2022 Tumor Marker   Patient's tumor was tested for the following markers: CA-125. Results of the tumor marker test revealed 38.3.    09/12/2022 Imaging   CT CHEST ABDOMEN PELVIS W CONTRAST  Result Date: 09/12/2022 CLINICAL DATA:  Assess treatment response ovarian cancer * Tracking Code: BO * EXAM: CT CHEST, ABDOMEN, AND PELVIS WITH CONTRAST TECHNIQUE: Multidetector CT imaging of the chest, abdomen and pelvis was performed following the standard protocol during bolus administration of intravenous contrast. RADIATION DOSE REDUCTION: This exam was performed according to the departmental dose-optimization program which includes automated exposure control, adjustment of the mA and/or kV according to patient size and/or use of iterative reconstruction technique. CONTRAST:  OMNIPAQUE IOHEXOL 300 MG/ML  SOLN COMPARISON:  Abdomen pelvis CT 06/05/2022 and older. Chest CT 12/25/2021 and older. FINDINGS: CT CHEST FINDINGS Cardiovascular: Right upper chest port. Heart is nonenlarged. No pericardial effusion. Thoracic aorta has a normal course and caliber with mild atherosclerotic plaque. Mediastinum/Nodes: Stable thyroid gland. Slightly patulous thoracic esophagus with small hiatal hernia. No specific abnormal lymph node enlargement identified including axillary regions, hilum or mediastinum. No thoracic inlet  abnormal nodes. One of the lymph nodes in the left axilla has some calcification, unchanged from previous. Lungs/Pleura: Lungs are without consolidation, pneumothorax or effusion. Few areas of peripheral interstitial septal thickening, scarring and fibrotic change. Previously there were numerous cavitary nodules in both lungs. The extent and distribution appears overall decreasing. The residual cavitary lesions have thinner walls than previous from September 2023. Specific lesions will be followed for continuity and compared in measurement to that exam. The nodules that are seen on the more recent study of the abdomen and pelvis from February 2024 also show improvement and wall thickness. Right upper lobe focus laterally which previously  measured 6 mm in diameter, today is measured larger at 7 mm but again has a much thinner wall. Left upper lobe focus which previously has a diameter of 6 mm, today on series 4, image 53 has a diameter of 7 mm. Again much thinner wall. Less nodular. Focus right lower lobe laterally and posteriorly which previously measured 6 mm, today on series 4, image 116 measures 5 mm. Wall is thinner. And lastly nodule middle lobe posteriorly which measured 3 mm on the prior, today on series 4, image 76 is smaller at proximally 1-2 mm. Musculoskeletal: Mild curvature of the spine. Scattered degenerative changes. CT ABDOMEN PELVIS FINDINGS Hepatobiliary: Diffuse fatty liver infiltration. No space-occupying liver lesion. Patent portal vein. Previous cholecystectomy. Once again there is some thickening and calcification along falciform ligament which is similar on today's examination, series 2, image 52 for example. The nodule anterior to the left hepatic lobe towards the dome and margin of the diaphragm which previously measured 13 x 7 mm, today has some residual tissue measuring 10 x 4 mm. Additional focus posterior to segment 7 in the right hepatic lobe and laterally of also improved. Less thickening of the rind of soft tissue along the surface of the liver laterally. Pancreas: Global pancreatic atrophy without mass. Spleen: Preserved enhancement. Spleen previously was mildly enlarged at 13.1 cm cephalocaudal length and today measured at 12.7 cm. Adrenals/Urinary Tract: Adrenal glands are preserved. No enhancing renal mass or collecting system dilatation. Extrarenal pelvis seen to both kidneys. Stable Bosniak 1 exophytic left-sided renal cystic lesion. No specific imaging follow-up. The ureters have normal course and caliber down to the bladder. Preserved contours of the urinary bladder. Stomach/Bowel: Large amount of diffuse colonic stool. Focal caliber change in the course of the colon along the proximal sigmoid colon,  nonspecific. Stomach and small bowel are nondilated. Small hiatal hernia. Once again there is rectus muscle diastasis with protuberance of bowel loops and fat in this location at the level of the upper pelvis. Again no signs of obstruction. Vascular/Lymphatic: Scattered vascular calcifications. Normal caliber aorta and IVC. Of interest best seen on coronal imaging there is a slightly tortuous course of the portal vein with what may be a thin stricture or web as seen on sagittal series 5, image 68. In retrospect this was present and has a uncertain etiology and significance. Several prominent lymph nodes are again identified. Specific lesions will be followed. Upper retroperitoneal nodes of the celiac axis are again seen. Example which previously measured 12 x 12 mm with some calcification, today on series 2, image 56 measures 9 by 10 mm. Lesion just caudal to this which previously measured 19 x 10 mm, today measures 15 by 10 mm on series 2, image 57. Right-sided central mesenteric lymph node which previously measured 14 x 9 mm, today measures 10 x 5 mm on series 2,  image 71. Central pelvic mesenteric node which previously measured 13 x 9 mm, today on series 2 image 94 measures 12 x 7 mm. This has a fatty hilum. Bilateral inguinal nodes with calcifications are seen. Left-sided focus previously measured 16 x 14 mm and today 13 x 11 mm. Right-sided inguinal focus which previously measured 17 x 14 mm, today series 2, image 115 measures 15 by 12 mm. Reproductive: Status post hysterectomy. No adnexal masses. Other: No free air or free fluid. The mesenteric cystic focus along the left side on the prior examination is not well seen today. Musculoskeletal: Curvature of the spine. Scattered degenerative changes of the spine and pelvis. IMPRESSION: Overall interval improvement. Decreasing surface nodularity and thickening along the liver. Decreasing lymph nodes overall. No new ascites or lymph node enlargement. Previous  pleural effusions no longer identified from the study of February 2024. Several cavitary lesions identified throughout the lungs continue to show decreasing wall thickening and nodularity. Some of the lesions measured larger than the study of September 2023 but this could be evolution of treatment with decreasing soft tissue components Fatty liver infiltration.  Borderline spleen size. Large amount of colonic stool Electronically Signed   By: Karen Kays M.D.   On: 09/12/2022 09:56      09/25/2022 -  Chemotherapy    She started taking zejula   10/04/2022 Tumor Marker   Patient's tumor was tested for the following markers: CA-125. Results of the tumor marker test revealed 23.9.     PHYSICAL EXAMINATION: ECOG PERFORMANCE STATUS: 1 - Symptomatic but completely ambulatory  Vitals:   12/02/22 0957  BP: (!) 146/61  Pulse: 73  Resp: 18  SpO2: 99%   Filed Weights   12/02/22 0957  Weight: 136 lb 6.4 oz (61.9 kg)    GENERAL:alert, no distress and comfortable NEURO: alert & oriented x 3 with fluent speech, no focal motor/sensory deficits  LABORATORY DATA:  I have reviewed the data as listed    Component Value Date/Time   NA 134 (L) 10/25/2022 1145   NA 139 04/14/2017 0742   K 3.8 10/25/2022 1145   K 4.0 04/14/2017 0742   CL 101 10/25/2022 1145   CO2 23 10/25/2022 1145   CO2 21 (L) 04/14/2017 0742   GLUCOSE 136 (H) 10/25/2022 1145   GLUCOSE 247 (H) 04/14/2017 0742   BUN 12 10/25/2022 1145   BUN 13.1 04/14/2017 0742   CREATININE 1.04 (H) 10/25/2022 1145   CREATININE 0.83 08/29/2022 0754   CREATININE 0.9 04/14/2017 0742   CALCIUM 9.2 10/25/2022 1145   CALCIUM 9.2 04/14/2017 0742   PROT 6.2 (L) 10/15/2022 1314   PROT 7.3 04/14/2017 0742   ALBUMIN 3.5 10/15/2022 1314   ALBUMIN 3.9 04/14/2017 0742   AST 16 10/15/2022 1314   AST 13 (L) 08/29/2022 0754   AST 17 04/14/2017 0742   ALT 5 10/15/2022 1314   ALT 6 08/29/2022 0754   ALT 14 04/14/2017 0742   ALKPHOS 76 10/15/2022 1314    ALKPHOS 70 04/14/2017 0742   BILITOT 0.6 10/15/2022 1314   BILITOT 0.4 08/29/2022 0754   BILITOT 0.37 04/14/2017 0742   GFRNONAA 59 (L) 10/25/2022 1145   GFRNONAA >60 08/29/2022 0754   GFRAA >60 01/24/2020 0924   GFRAA >60 07/19/2019 0951    No results found for: "SPEP", "UPEP"  Lab Results  Component Value Date   WBC 7.2 12/02/2022   NEUTROABS 4.9 12/02/2022   HGB 11.4 (L) 12/02/2022   HCT 34.0 (L) 12/02/2022  MCV 101.2 (H) 12/02/2022   PLT 181 12/02/2022      Chemistry      Component Value Date/Time   NA 134 (L) 10/25/2022 1145   NA 139 04/14/2017 0742   K 3.8 10/25/2022 1145   K 4.0 04/14/2017 0742   CL 101 10/25/2022 1145   CO2 23 10/25/2022 1145   CO2 21 (L) 04/14/2017 0742   BUN 12 10/25/2022 1145   BUN 13.1 04/14/2017 0742   CREATININE 1.04 (H) 10/25/2022 1145   CREATININE 0.83 08/29/2022 0754   CREATININE 0.9 04/14/2017 0742      Component Value Date/Time   CALCIUM 9.2 10/25/2022 1145   CALCIUM 9.2 04/14/2017 0742   ALKPHOS 76 10/15/2022 1314   ALKPHOS 70 04/14/2017 0742   AST 16 10/15/2022 1314   AST 13 (L) 08/29/2022 0754   AST 17 04/14/2017 0742   ALT 5 10/15/2022 1314   ALT 6 08/29/2022 0754   ALT 14 04/14/2017 0742   BILITOT 0.6 10/15/2022 1314   BILITOT 0.4 08/29/2022 0754   BILITOT 0.37 04/14/2017 0742

## 2022-12-02 NOTE — Assessment & Plan Note (Signed)
Overall, he tolerated niraparib well She has no side effects and she feels well Plan to order imaging study next month for objective assessment of response to therapy

## 2022-12-02 NOTE — Addendum Note (Signed)
Addended byBertis Ruddy,  on: 12/02/2022 03:31 PM   Modules accepted: Orders

## 2022-12-03 ENCOUNTER — Encounter: Payer: Self-pay | Admitting: Hematology and Oncology

## 2022-12-04 ENCOUNTER — Telehealth: Payer: Self-pay

## 2022-12-04 ENCOUNTER — Other Ambulatory Visit: Payer: Self-pay | Admitting: Hematology and Oncology

## 2022-12-04 MED ORDER — CEFPODOXIME PROXETIL 100 MG PO TABS: 100.0000 mg | ORAL_TABLET | Freq: Two times a day (BID) | ORAL | 0 refills | Status: DC

## 2022-12-04 NOTE — Telephone Encounter (Signed)
Called and given below message. Pt verbalized understanding.

## 2022-12-04 NOTE — Telephone Encounter (Signed)
-----   Message from Artis Delay sent at 12/04/2022  9:22 AM EDT ----- Her urine culture showed UTI I sent antibiotics to her pharmacy, please call

## 2022-12-12 ENCOUNTER — Encounter: Payer: Self-pay | Admitting: Hematology and Oncology

## 2023-01-01 ENCOUNTER — Other Ambulatory Visit: Payer: Self-pay | Admitting: Hematology and Oncology

## 2023-01-12 ENCOUNTER — Other Ambulatory Visit: Payer: Self-pay | Admitting: Hematology and Oncology

## 2023-01-13 ENCOUNTER — Encounter: Payer: Self-pay | Admitting: Hematology and Oncology

## 2023-01-13 ENCOUNTER — Other Ambulatory Visit: Payer: Self-pay

## 2023-01-13 MED ORDER — NIRAPARIB TOSYLATE 100 MG PO TABS: 100.0000 mg | ORAL_TABLET | Freq: Every day | ORAL | 3 refills | Status: DC

## 2023-01-14 ENCOUNTER — Ambulatory Visit (HOSPITAL_BASED_OUTPATIENT_CLINIC_OR_DEPARTMENT_OTHER)
Admission: RE | Admit: 2023-01-14 | Discharge: 2023-01-14 | Disposition: A | Discharge status: Home / Self Care | Payer: Medicare Other | Source: Ambulatory Visit | Attending: Hematology and Oncology | Admitting: Hematology and Oncology

## 2023-01-14 DIAGNOSIS — C786 Secondary malignant neoplasm of retroperitoneum and peritoneum: Secondary | ICD-10-CM | POA: Diagnosis present

## 2023-01-14 DIAGNOSIS — C5701 Malignant neoplasm of right fallopian tube: Secondary | ICD-10-CM | POA: Insufficient documentation

## 2023-01-14 MED ORDER — IOHEXOL 300 MG/ML  SOLN
100.0000 mL | Freq: Once | INTRAMUSCULAR | Status: AC | PRN
  Administered 2023-01-14: 85 mL via INTRAVENOUS

## 2023-01-17 ENCOUNTER — Inpatient Hospital Stay: Payer: Medicare Other | Attending: Hematology and Oncology

## 2023-01-17 ENCOUNTER — Inpatient Hospital Stay (HOSPITAL_BASED_OUTPATIENT_CLINIC_OR_DEPARTMENT_OTHER): Payer: Medicare Other | Admitting: Hematology and Oncology

## 2023-01-17 ENCOUNTER — Encounter: Payer: Self-pay | Admitting: Hematology and Oncology

## 2023-01-17 VITALS — BP 156/74 | HR 108 | Temp 97.5°F | Resp 18 | Ht 65.0 in | Wt 140.0 lb

## 2023-01-17 DIAGNOSIS — C7801 Secondary malignant neoplasm of right lung: Secondary | ICD-10-CM | POA: Insufficient documentation

## 2023-01-17 DIAGNOSIS — N183 Chronic kidney disease, stage 3 unspecified: Secondary | ICD-10-CM | POA: Diagnosis not present

## 2023-01-17 DIAGNOSIS — I10 Essential (primary) hypertension: Secondary | ICD-10-CM

## 2023-01-17 DIAGNOSIS — C7802 Secondary malignant neoplasm of left lung: Secondary | ICD-10-CM | POA: Diagnosis not present

## 2023-01-17 DIAGNOSIS — C786 Secondary malignant neoplasm of retroperitoneum and peritoneum: Secondary | ICD-10-CM

## 2023-01-17 DIAGNOSIS — D649 Anemia, unspecified: Secondary | ICD-10-CM | POA: Diagnosis not present

## 2023-01-17 DIAGNOSIS — C5701 Malignant neoplasm of right fallopian tube: Secondary | ICD-10-CM | POA: Diagnosis not present

## 2023-01-17 DIAGNOSIS — C7963 Secondary malignant neoplasm of bilateral ovaries: Secondary | ICD-10-CM | POA: Insufficient documentation

## 2023-01-17 DIAGNOSIS — Z7189 Other specified counseling: Secondary | ICD-10-CM

## 2023-01-17 DIAGNOSIS — J984 Other disorders of lung: Secondary | ICD-10-CM

## 2023-01-17 LAB — CBC WITH DIFFERENTIAL/PLATELET
Abs Immature Granulocytes: 0.02 10*3/uL (ref 0.00–0.07)
Basophils Absolute: 0.1 10*3/uL (ref 0.0–0.1)
Basophils Relative: 1 %
Eosinophils Absolute: 0.3 10*3/uL (ref 0.0–0.5)
Eosinophils Relative: 6 %
HCT: 30.7 % — ABNORMAL LOW (ref 36.0–46.0)
Hemoglobin: 10.6 g/dL — ABNORMAL LOW (ref 12.0–15.0)
Immature Granulocytes: 0 %
Lymphocytes Relative: 20 %
Lymphs Abs: 1 10*3/uL (ref 0.7–4.0)
MCH: 34.9 pg — ABNORMAL HIGH (ref 26.0–34.0)
MCHC: 34.5 g/dL (ref 30.0–36.0)
MCV: 101 fL — ABNORMAL HIGH (ref 80.0–100.0)
Monocytes Absolute: 0.4 10*3/uL (ref 0.1–1.0)
Monocytes Relative: 9 %
Neutro Abs: 3.1 10*3/uL (ref 1.7–7.7)
Neutrophils Relative %: 64 %
Platelets: 183 10*3/uL (ref 150–400)
RBC: 3.04 MIL/uL — ABNORMAL LOW (ref 3.87–5.11)
RDW: 14.4 % (ref 11.5–15.5)
WBC: 4.8 10*3/uL (ref 4.0–10.5)
nRBC: 0 % (ref 0.0–0.2)

## 2023-01-17 LAB — COMPREHENSIVE METABOLIC PANEL
ALT: 5 U/L (ref 0–44)
AST: 15 U/L (ref 15–41)
Albumin: 3.9 g/dL (ref 3.5–5.0)
Alkaline Phosphatase: 69 U/L (ref 38–126)
Anion gap: 7 (ref 5–15)
BUN: 17 mg/dL (ref 8–23)
CO2: 26 mmol/L (ref 22–32)
Calcium: 9.3 mg/dL (ref 8.9–10.3)
Chloride: 104 mmol/L (ref 98–111)
Creatinine, Ser: 1.4 mg/dL — ABNORMAL HIGH (ref 0.44–1.00)
GFR, Estimated: 41 mL/min — ABNORMAL LOW (ref >60)
Glucose, Bld: 117 mg/dL — ABNORMAL HIGH (ref 70–99)
Potassium: 4.2 mmol/L (ref 3.5–5.1)
Sodium: 137 mmol/L (ref 135–145)
Total Bilirubin: 0.6 mg/dL (ref 0.3–1.2)
Total Protein: 6.3 g/dL — ABNORMAL LOW (ref 6.5–8.1)

## 2023-01-17 MED ORDER — SODIUM CHLORIDE 0.9% FLUSH
10.0000 mL | Freq: Once | INTRAVENOUS | Status: AC
  Administered 2023-01-17: 10 mL

## 2023-01-17 MED ORDER — HEPARIN SOD (PORK) LOCK FLUSH 100 UNIT/ML IV SOLN
500.0000 [IU] | Freq: Once | INTRAVENOUS | Status: AC
  Administered 2023-01-17: 500 [IU]

## 2023-01-17 NOTE — Progress Notes (Signed)
[facility] OFFICE PROGRESS NOTE  Patient Care Team: Arabella Merles, CNM as PCP - General (Obstetrics and Gynecology) Artis Delay, MD as Consulting Physician (Hematology and Oncology) Adolphus Birchwood, MD as Consulting Physician (Obstetrics and Gynecology)  HISTORY OF PRESENTING ILLNESS: Discussed the use of AI scribe software for clinical note transcription with the patient, who gave verbal consent to proceed. She returns here with her husband to review test results  History of Present Illness   The patient, with a known diagnosis of recurrent fallopian tube cancer, has been undergoing treatment with Zejula. She reports tolerating the medication well, with no issues in obtaining prescription refills. The patient's creatinine levels have occasionally increased, but she reports adequate hydration with water.  The patient's blood pressure has been fluctuating, with some readings on the lower side. She has been on Atenolol and Lisinopril for hypertension, but due to the recent low readings, she has stopped taking these medications.  The patient has not been experiencing any bowel issues and has not needed to take stool softeners or Miralax. She has been adhering to a diet of water and occasional tea, avoiding carbonated drinks due to mouth discomfort.  The patient has not reported any symptoms related to this hernia. We spent majority of the time reviewing labs and CT imaging  In summary, the patient's cancer appears to be responding well to [facility] treatment, with manageable side effects. The new finding of a hernia will need to be monitored, but no immediate intervention is planned.         Assessment and Plan    Metastatic recurrent fallopian tube Cancer Improvement in abdominal lesions on Zejula. No issues with prescription refills. Mild anemia (Hb 10.6) likely secondary to Zejula, stable compared to previous levels. -Continue Zejula as tolerated. -Next CT scan after  Christmas.  Hypertension Blood pressure readings mostly on the low side with occasional spikes. Patient has been off antihypertensive medications due to low readings. -Resume Atenolol only if systolic blood pressure is greater than 140 for two days in a row. -Remove both atenolol and Lisinopril from medication list.  Abdominal Hernia New large hernia noted on CT scan, likely secondary to recent coughing episodes. No current plans for surgical intervention due to potential complications. -Monitor for changes or complications related to hernia.  Tachycardia and Hypotension Episodes of high heart rate and low blood pressure leading to shortness of breath. Likely secondary to dehydration. -Increase fluid intake with a focus on fluids with minerals and salts (e.g., Gatorade).   Cystic lesions in lungs Not related to her cancer, cause unknown, stable  Follow-up in 8 weeks (March 14, 2023) or sooner if any issues arise.          No orders of the defined types were placed in this encounter.   All questions were answered. The patient knows to call the clinic with any problems, questions or concerns. The total time spent in the appointment was 40 minutes encounter with patients including review of chart and various tests results, discussions about plan of care and coordination of care plan   Artis Delay, MD 01/17/2023 9:39 AM  REVIEW OF SYSTEMS:   Constitutional: Denies fevers, chills or abnormal weight loss Eyes: Denies blurriness of vision Ears, nose, mouth, throat, and face: Denies mucositis or sore throat Respiratory: Denies cough, dyspnea or wheezes Cardiovascular: Denies palpitation, chest discomfort or lower extremity swelling Gastrointestinal:  Denies nausea, heartburn or change in bowel habits Skin: Denies abnormal skin rashes Lymphatics: Denies new  lymphadenopathy or easy bruising Neurological:Denies numbness, tingling or new weaknesses Behavioral/Psych: Mood is stable,  no new changes  All other systems were reviewed with the patient and are negative.  I have reviewed the past medical history, past surgical history, social history and family history with the patient and they are unchanged from previous note.  ALLERGIES:  is allergic to dilaudid [hydromorphone hcl], morphine and codeine, and sudafed [pseudoephedrine hcl].  MEDICATIONS:  Current Outpatient Medications  Medication Sig Dispense Refill   ALAWAY 0.035 % ophthalmic solution Place 1 drop into both eyes 2 (two) times daily as needed (for irritation).     docusate sodium (COLACE) 100 MG capsule Take 100 mg by mouth daily as needed for mild constipation.     lidocaine-prilocaine (EMLA) cream Apply to affected area once daily as directed. (Patient taking differently: 1 application  daily as needed (as directed- to access port).) 30 g 3   MIRALAX 17 GM/SCOOP powder Take 17 g by mouth daily as needed for mild constipation.     niraparib tosylate (ZEJULA) 100 MG tablet Take 1 tablet (100 mg total) by mouth daily. May take at bedtime to reduce nausea and vomiting. 30 tablet 3   ondansetron (ZOFRAN) 8 MG tablet Take 1 tablet (8 mg total) by mouth every 8 (eight) hours as needed for nausea or vomiting. Start on the third day after chemotherapy. 30 tablet 1   prochlorperazine (COMPAZINE) 10 MG tablet Take 1 tablet (10 mg total) by mouth every 6 (six) hours as needed for nausea or vomiting. 30 tablet 1   RESTASIS 0.05 % ophthalmic emulsion Place 1 drop into both eyes 2 (two) times daily.     sodium chloride (OCEAN) 0.65 % SOLN nasal spray Place 1 spray into both nostrils as needed for congestion.     triamcinolone cream (KENALOG) 0.1 % Apply 1 Application topically 2 (two) times daily.     TYLENOL 8 HOUR ARTHRITIS PAIN 650 MG CR tablet Take 650-1,300 mg by mouth every 8 (eight) hours as needed for pain.     venlafaxine XR (EFFEXOR-XR) 37.5 MG 24 hr capsule TAKE 1 CAPSULE BY MOUTH DAILY WITH BREAKFAST. (Patient  taking differently: Take 37.5 mg by mouth daily with breakfast.) 90 capsule 3   No current facility-administered medications for this visit.    SUMMARY OF ONCOLOGIC HISTORY: Oncology History Overview Note  Neg genetics High grade serous    Adenocarcinoma of right fallopian tube (HCC)  08/12/2016 Initial Diagnosis   The patient has many months of vague symptoms of fullness in the pelvis, urinary frequency and difficulty with defecation. She denies vaginal bleeding or rectal bleeding.     08/12/2016 Imaging   Abdomen X-ray in the ER: Moderate colonic stool burden without evidence of enteric obstruction   11/13/2016 Imaging   TVUS was performed on 11/13/16 which showed a large hypoechoic lobular mass seen posterior to the uterus measuring 18.2x16.1x11.8cm, blood flow was seen along the periphery of the mass. It was considered to be likely to be a fibroid vs pelvic mass vs ovarian mass. Neither ovary was removed. Moderate amount of free fluid in the pelvis. THe uterus measures 4x10x4cm. The endometrium is 4mm.    12/05/2016 Imaging   MR pelvis 1. Mild limitations as detailed above. 2. Heterogeneous pelvic mass is favored to arise from the right ovary. Favor a solid ovarian neoplasm such as fibroma/Brenner's tumor. Given lesion size and heterogeneous T2 signal out, complicating torsion cannot be excluded. 3. Moderate abdominopelvic ascites, without specific  evidence of peritoneal metastasis. Consider further evaluation with contrast-enhanced abdominopelvic CT.    12/26/2016 Pathology Results   1. Uterus, ovaries and fallopian tubes - ADENOCARCINOMA INVOLVING RIGHT FALLOPIAN TUBE, RIGHT AND LEFT OVARIES, UTERINE SEROSA AND PELVIC MASS. - SEE ONCOLOGY TABLE AND COMMENT. - UTERINE CERVIX, ENDOMETRIUM, MYOMETRIUM AND LEFT FALLOPIAN TUBE FREE OF TUMOR. 2. Omentum, resection for tumor - ADENOCARCINOMA. Microscopic Comment 1. ONCOLOGY TABLE - FALLOPIAN TUBE. SEE COMMENT 1. Specimen, including  laterality: Uterus, bilateral adnexa, pelvic mass and omentum. 2. Procedure: Hysterectomy with bilateral salpingo-oophorectomy, pelvic mass excision and omentectomy. 3. Lymph node sampling performed: No 4. Tumor site: See comment. 5. Tumor location in fallopian tube: Right fallopian tube fimbria 6. Specimen integrity (intact/ruptured/disrupted): Intact 7. Tumor size (cm): 18 cm, see comment. 8. Histologic type: Adenocarcinoma 9. Grade: High grade 10. Microscopic tumor extension: Tumor involves right fallopian tube, right and left ovaries, uterine serosa, pelvic mass and omentum. 11. Margins: See comment. 12. Lymph-Vascular invasion: Present 13. Lymph nodes: # examined: 0; # positive: N/A 14. TNM: pT3c, pNX 15. FIGO Stage (based on pathologic findings, needs clinical correlation: III-C 16. Comments: There is an 18 cm pelvic mass which is a high grade adenocarcinoma and there is a 12.2 cm  segment of omentum which is extensively involved with adenocarcinoma. The tumor also involves the fimbria of the right fallopian tube and the parenchyma of the right and left ovaries as well as uterine serosa. The fimbria of the right fallopian has intraepithelial atypia consistent with a precursor lesion and therefore this is most consistent with primary fallopian tube adenocarcinoma. A primary peritoneal serous carcinoma is a less likely possibility.    12/26/2016 Pathology Results   PERITONEAL/ASCITIC FLUID(SPECIMEN 1 OF 1 COLLECTED 12/26/16): POORLY DIFFERENTIATED ADENOCARCINOMA   12/26/2016 Surgery   Procedure(s) Performed: Exploratory laparotomy with total abdominal hysterectomy, bilateral salpingo-oophorectomy, omentectomy radical tumor debulking for ovarian cancer .   Surgeon: Luisa Dago, MD.      Operative Findings: 20cm left ovarian mass densely adherent to the posterior uterus, right tube and ovary, cervix, sigmoid colon. 10cm omental cake. 4L ascites. 1mm size tumor nodules on the serosa of the  terminal ileum and proximal sigmoid colon. 1mm nodules on right diaphragm.    This represented an optimal cytoreduction (R1) with 1mm nodules on intestine and diaphragm representing gross visible disease   01/16/2017 Procedure   Placement of single lumen port a cath via right internal jugular vein. The catheter tip lies at the cavo-atrial junction. A power injectable port a cath was placed and is ready for immediate use.   01/17/2017 Imaging   1. 3.5 x 7.2 x 4.6 cm fluid collection along the vaginal cuff, posterior the bladder and extending into the right adnexal space. This lesion demonstrates rim enhancement. Imaging features could be related to a loculated postoperative seroma or hematoma. Superinfection cannot be excluded by CT. Given debulking surgery was 3 weeks ago, this entire structure is un likely to represent neoplasm, but peritoneal involvement could have this appearance. 2. Small fluid collection in the left para colic gutter without rim enhancement. 3. Irregular/nodular appearance of the peritoneal M in the anatomic pelvis with areas of subtle nodularity between the stomach and the spleen. Close attention in these regions on follow-up recommended as metastatic disease is a concern. 4. Mildly enlarged hepatoduodenal ligament lymph node. Attention on follow-up recommended.   01/20/2017 Tumor Marker   Patient's tumor was tested for the following markers: CA-125 Results of the tumor marker test revealed 46.6  01/28/2017 Genetic Testing       Negative genetic testing on the [facility] panel.  The [facility] gene panel offered by [employer] includes sequencing and deletion/duplication testing of the following 28 genes: APC, ATM, BARD1, BMPR1A, BRCA1, BRCA2, BRIP1, CHD1, CDK4, CDKN2A, CHEK2, EPCAM (large rearrangement only), MLH1, MSH2, MSH6, MUTYH, NBN, PALB2, PMS2, PTEN, RAD51C, RAD51D, SMAD4, STK11, and TP53. Sequencing was performed for select regions of POLE and POLD1, and  large rearrangement analysis was performed for select regions of GREM1. The report date is January 28, 2017.  HRD testing looking for genomic instability and BRCA mutations was negative.  The report date of this test is January 27, 2017.    01/31/2017 Tumor Marker   Patient's tumor was tested for the following markers: CA-125 Results of the tumor marker test revealed 22.4   03/04/2017 Tumor Marker   Patient's tumor was tested for the following markers: CA-125 Results of the tumor marker test revealed 13.8   04/18/2017 Tumor Marker   Patient's tumor was tested for the following markers: CA-125 Results of the tumor marker test revealed 11.9   05/05/2017 Tumor Marker   Patient's tumor was tested for the following markers: CA-125 Results of the tumor marker test revealed 12   05/26/2017 Tumor Marker   Patient's tumor was tested for the following markers: CA-125 Results of the tumor marker test revealed 10.6   05/26/2017 Imaging   1. A previously noted postoperative fluid collection in the low pelvis and residual ascites seen on the prior examination has resolved on today's study. No findings to suggest residual/recurrent disease on today's examination. No definite solid organ metastasis identified in the abdomen or pelvis. No lymphadenopathy. 2. Aortic atherosclerosis.   06/10/2017 Procedure   Successful right IJ vein Port-A-Cath explant.   03/09/2018 Tumor Marker   Patient's tumor was tested for the following markers: CA-125 Results of the tumor marker test revealed 12.1   05/26/2018 Tumor Marker   Patient's tumor was tested for the following markers: CA-125 Results of the tumor marker test revealed 13.8   09/09/2018 Tumor Marker   Patient's tumor was tested for the following markers: CA-125 Results of the tumor marker test revealed 23.9   12/18/2018 Tumor Marker   Patient's tumor was tested for the following markers: CA-125 Results of the tumor marker test revealed 43.8   02/27/2019  - 02/28/2019 Hospital Admission   She was admitted for bowel obstruction   02/27/2019 Imaging   1. High-grade small bowel obstruction with transition point in the pelvis in an area of suspected desmoplastic reaction surrounding a 1.3 cm spiculated nodule in the mesentery, suspicious for carcinoid. There is hyperenhancement near the tip of the appendix and loss of the normal fat plane between it and the adjacent sigmoid colon, potentially the site of primary lesion. 2. Multiple new small subcentimeter hyperdense lesions scattered along the liver capsule, concerning for metastases. Enlarged hyperenhancing gastrohepatic and portacaval lymph nodes concerning for nodal metastases. 3. Very mild right hydroureteronephrosis to the level of the desmoplastic reaction in the pelvis, potentially involving the right ureter. 4. Infraumbilical ventral abdominal wall diastasis containing a small nondilated portion of transverse colon. 5. Trace ascites. 6. Cholelithiasis. 7.  Aortic atherosclerosis (ICD10-I70.0).     03/05/2019 Tumor Marker   Patient's tumor was tested for the following markers: CA-125 Results of the tumor marker test revealed 70.1   03/12/2019 Echocardiogram   IMPRESSIONS     1. Left ventricular ejection fraction, by visual estimation, is 60  to 65%. The left ventricle has normal function. There is no left ventricular hypertrophy.  2. Left ventricular diastolic parameters are consistent with Grade I diastolic dysfunction (impaired relaxation).  3. Global right ventricle has normal systolic function.The right ventricular size is normal. No increase in right ventricular wall thickness.  4. Left atrial size was normal.  5. Right atrial size was normal.  6. The pericardium was not assessed.  7. The mitral valve is normal in structure. No evidence of mitral valve regurgitation.  8. The tricuspid valve is normal in structure. Tricuspid valve regurgitation is trivial.  9. The aortic valve is  normal in structure. Aortic valve regurgitation is not visualized. 10. The pulmonic valve was grossly normal. Pulmonic valve regurgitation is not visualized. 11. The average left ventricular global longitudinal strain is -17.6 %.   03/16/2019 Procedure   Successful placement of a right internal jugular approach power injectable Port-A-Cath. The catheter is ready for immediate use.     04/19/2019 Tumor Marker   Patient's tumor was tested for the following markers: CA-125 Results of the tumor marker test revealed 39.8   05/17/2019 Tumor Marker   Patient's tumor was tested for the following markers: CA-125 Results of the tumor marker test revealed 27   05/28/2019 Tumor Marker   Patient's tumor was tested for the following markers: CA-125 Results of the tumor marker test revealed 27.7.   06/11/2019 Imaging   1. No new or progressive metastatic disease in the abdomen or pelvis. 2. Tiny noncalcified perisplenic implant is decreased. Calcified pericardiophrenic, retroperitoneal and bilateral inguinal lymph nodes and scattered small calcified perihepatic and perisplenic implants are stable. 3.  Aortic Atherosclerosis (ICD10-I70.0).       06/17/2019 Echocardiogram    1. Left ventricular ejection fraction, by estimation, is 65 to 70%. The left ventricle has normal function. The left ventricle has no regional wall motion abnormalities. Left ventricular diastolic parameters were normal.  2. Right ventricular systolic function is normal. The right ventricular size is normal.  3. The mitral valve is normal in structure and function. No evidence of mitral valve regurgitation. No evidence of mitral stenosis.  4. The aortic valve is normal in structure and function. Aortic valve regurgitation is not visualized. No aortic stenosis is present.   06/21/2019 Tumor Marker   Patient's tumor was tested for the following markers: CA-125 Results of the tumor marker test revealed 18.7   07/19/2019 Tumor Marker    Patient's tumor was tested for the following markers: CA-125 Results of the tumor marker test revealed 16.7   08/30/2019 Tumor Marker   Patient's tumor was tested for the following markers: CA-125. Results of the tumor marker test revealed 13.9   09/15/2019 Echocardiogram    1. Left ventricular ejection fraction, by estimation, is 65 to 70%. The left ventricle has normal function. The left ventricle has no regional wall motion abnormalities. Left ventricular diastolic parameters are consistent with Grade I diastolic dysfunction (impaired relaxation). The average left ventricular global longitudinal strain is 18.1 %. The global longitudinal strain is normal.  2. Right ventricular systolic function is normal. The right ventricular size is normal.  3. The mitral valve is normal in structure. No evidence of mitral valve regurgitation. No evidence of mitral stenosis.  4. The aortic valve is normal in structure. Aortic valve regurgitation is not visualized. No aortic stenosis is present.  5. The inferior vena cava is normal in size with greater than 50% respiratory variability, suggesting right atrial pressure of 3 mmHg.  09/24/2019 Imaging   1. Stable exam. No new or progressive metastatic disease on today's study. 2. Small subcapsular hypodensity in the lateral spleen, new in the interval, but indeterminate. Attention on follow-up recommended. 3. The calcified upper abdominal and groin lymph nodes are stable as are the scattered calcified and noncalcified peritoneal implants. 4. Stable midline ventral hernia containing a short segment of colon without complicating features. 5. Aortic Atherosclerosis (ICD10-I70.0).     10/04/2019 - 09/21/2021 Chemotherapy   Patient is on Treatment Plan : ovarian Bevacizumab q21d     10/04/2019 Tumor Marker   Patient's tumor was tested for the following markers: CA-125. Results of the tumor marker test revealed 12.9.   11/01/2019 Tumor Marker   Patient's tumor was  tested for the following markers: CA-125 Results of the tumor marker test revealed 15.4   12/13/2019 Tumor Marker   Patient's tumor was tested for the following markers: CA-125 Results of the tumor marker test revealed 14.8   12/31/2019 Imaging   1. Unchanged tiny peritoneal nodule adjacent to the spleen measuring 6 mm. Other previously noted peritoneal nodules are imperceptible on present examination. 2. Unchanged prominent, partially calcified portacaval lymph node and bilateral inguinal lymph nodes. 3. Unchanged subtle, nonspecific subcapsular lesion of the spleen.  4. No evidence of new metastatic disease in the abdomen or pelvis. 5. Status post hysterectomy. 6. Unchanged low midline ventral abdominal hernia containing a single loop of nonobstructed transverse colon. 7. Cholelithiasis. 8. Aortic Atherosclerosis (ICD10-I70.0).       01/03/2020 Tumor Marker   Patient's tumor was tested for the following markers: CA-125 Results of the tumor marker test revealed 11.7.   01/24/2020 Tumor Marker   Patient's tumor was tested for the following markers: CA-125. Results of the tumor marker test revealed 15.1   03/06/2020 Tumor Marker   Patient's tumor was tested for the following markers: CA-125. Results of the tumor marker test revealed 20.3   03/27/2020 Tumor Marker   Patient's tumor was tested for the following markers: CA-125 Results of the tumor marker test revealed 23.7   05/11/2020 Tumor Marker   Patient's tumor was tested for the following markers: CA-125. Results of the tumor marker test revealed 25.4   06/23/2020 Imaging   1. Cholelithiasis with gallbladder wall thickening and mild infiltrative edema in the porta hepatis, raising the possibility of acute cholecystitis. There is truncation of the common bile duct distally and distal choledocholithiasis is difficult to exclude. Correlate with bilirubin levels. If clinically warranted, MRCP could be utilized for further  characterization. 2. Stable tiny perisplenic nodule. 3. Other imaging findings of potential clinical significance: Small type 1 hiatal hernia. Prominent stool throughout the colon favors constipation. Ventral infraumbilical hernia contains transverse colon without findings of strangulation or obstruction. Small supraumbilical hernia contains adipose tissue. Lumbar degenerative disc disease at L5-S1. Stable tiny nodule along the lateral margin of the spleen, nonspecific. 4. Aortic atherosclerosis.     12/15/2020 Imaging   No evidence of recurrent or metastatic carcinoma within the abdomen or pelvis.   Stable ventral hernia containing transverse colon. No evidence of bowel obstruction or strangulation.   Cholelithiasis. No radiographic evidence of cholecystitis.   Tiny hiatal hernia.   Aortic Atherosclerosis (ICD10-I70.0).     06/11/2021 Imaging   1. Numerous sub 4 mm pulmonary nodules in the lungs some of which have a cavitary appearance. I think it is unlikely this is metastatic disease and more likely a possible immunotherapy related process such as a sarcoid like reaction. Recommend  full chest CT further evaluation. 2. Stable small calcified retroperitoneal and mesenteric lymph nodes. No findings suspicious for recurrent peritoneal carcinomatosis. 3. Stable lower abdominal wall hernia containing part of the transverse colon. 4. Cholelithiasis.   09/27/2021 Imaging   1. Numerous small pulmonary nodules throughout the lungs, the majority of which are cavitary. These are all subcentimeter, however nodules that were seen in the bilateral lung bases on prior CT dated 06/08/2021 appear slightly increased in size and number. Behavior is highly suspicious for pulmonary metastatic disease despite the unusual appearance, however as previously reported general differential considerations include atypical infection and inflammation, such as drug-induced sarcoidosis like reaction.  2. Unchanged  calcified lymph nodes in the abdomen and pelvis, consistent with treated nodal metastases. 3. Cholelithiasis with wall thickening and pericholecystic fluid. This appearance is similar to prior examination and suggests chronic cholecystitis. Correlate for clinical symptoms. Mild intrahepatic biliary ductal dilatation, similar to prior examination. 4. Midline ventral hernia containing a single nonobstructed loop of mid transverse colon.     12/25/2021 Imaging   1. Again noted are multiple thin walled cavitary lung nodules throughout both lungs. These are similar in size and multiplicity when compared with the exam from 09/26/2021. Etiology remains indeterminate, although metastatic disease cannot be excluded with a high degree of certainty. 2. Stable calcified upper abdominal lymph nodes consistent with treated nodal metastases. 3. Gallstones. 4. Aortic Atherosclerosis (ICD10-I70.0).   01/30/2022 Tumor Marker   Patient's tumor was tested for the following markers: CA-125. Results of the tumor marker test revealed 59.3.   01/31/2022 Imaging   1. No substantial interval change in exam. 2. Innumerable tiny cavitary pulmonary nodules in the lung bases. As noted previously, metastatic disease would be distinct consideration although imaging features are nonspecific and infectious/inflammatory etiology is not excluded. 3. Calcified nodal disease in the upper abdomen and pelvis shows no substantial interval change. Scattered noncalcified nodules in the mesentery are similar to prior. 4. Markedly large stool volume throughout the colon. Imaging features suggestive of clinical constipation. 5. Cholelithiasis with ill-defined appearance of the gallbladder wall. Appearance is stable in the interval. 6. Trace free fluid in the cul-de-sac. 7. Midline ventral hernia contains a short segment of transverse colon without complicating features. 8. Aortic Atherosclerosis (ICD10-I70.0).   03/07/2022 Surgery    Procedure Date:  03/07/2022  Pre-operative Diagnosis:  Symptomatic cholelithiasis   Post-operative Diagnosis: Symptomatic cholelithiasis, peritoneal implants with history of right fallopian tube cancer.   Procedure:   Robotic assisted cholecystectomy with ICG FireFly cholangiogram Robotic assisted abdominal wall peritoneal implant excisional biopsy x 3.   Surgeon:  Howie Ill, MD  Specimens:   Gallbladder Peritoneal implants x 3    Indications for Procedure:  This is a 67 y.o. female who presents with abdominal pain and workup revealing symptomatic cholelithiasis.  The benefits, complications, treatment options, and expected outcomes were discussed with the patient. The risks of bleeding, infection, recurrence of symptoms, failure to resolve symptoms, bile duct damage, bile duct leak, retained common bile duct stone, bowel injury, and need for further procedures were all discussed with the patient and she was willing to proceed.     03/07/2022 Pathology Results   SURGICAL PATHOLOGY  CASE: [id number]  PATIENT: Edwin Cap  Surgical Pathology Report   Specimen Submitted:  A. Gallbladder  B. Abdominal wall   Clinical History: Symptomatic cholelithiasis   DIAGNOSIS:  A. GALLBLADDER; CHOLECYSTECTOMY:  - CHRONIC CHOLECYSTITIS WITH CHOLELITHIASIS.  - NEGATIVE FOR DYSPLASIA AND MALIGNANCY.  - ONE LYMPH  NODE, INVOLVED BY METASTATIC CARCINOMA, COMPATIBLE WITH HIGH-GRADE SEROUS CARCINOMA OF GYNECOLOGIC PRIMARY.   B. SOFT TISSUE, ABDOMINAL WALL; BIOPSY:  - POSITIVE FOR MALIGNANCY.  - METASTATIC CARCINOMA, COMPATIBLE WITH HIGH-GRADE SEROUS CARCINOMA OF GYNECOLOGIC PRIMARY.   Comment:  Immunohistochemical studies were performed on neoplastic cells in both sources. Tumor cells in both are positive for CK7, Pax-8, and WT-1, supporting the above diagnosis. P53 demonstrates strong and diffuse expression, indicative of mutated expression pattern.   There is sufficient tissue  present for ancillary molecular testing if desired.    04/04/2022 Imaging   1. Mild progression of partially calcified peritoneal metastatic implants surrounding the liver, in the porta hepatis and in the periumbilical region. No generalized ascites or peritoneal nodularity status post omentectomy. 2. Grossly stable cavitary lesions at both lung bases, presumably reflecting metastatic disease. 3. No other evidence of progressive metastatic disease within the abdomen or pelvis. 4. Enlarging ventral hernia containing a portion of the transverse colon. No evidence of bowel obstruction or inflammation. 5.  Aortic Atherosclerosis (ICD10-I70.0).   04/08/2022 - 08/31/2022 Chemotherapy   Patient is on Treatment Plan : OVARIAN Carboplatin + Paclitaxel + Bevacizumab q21d      04/09/2022 Tumor Marker   Patient's tumor was tested for the following markers: CA-125. Results of the tumor marker test revealed 86.7.   05/22/2022 Tumor Marker   Patient's tumor was tested for the following markers: CA-125. Results of the tumor marker test revealed 49.7.   06/05/2022 Imaging   1. New tiny bilateral pleural effusions. 2. Once again there are several small cavitary lesions in the lungs. Many of these are becoming more cavitary with less soft tissue elements and are smaller today. 3. Soft tissue thickening and nodularity along the surface of the liver appears to be overall decreasing from previous examination. Some of these areas have calcification. 4. Mesenteric nodules as well as retroperitoneal and mesenteric nodes appear similar to slightly improved. The inguinal nodes seen previously are similar. 5. No developing new mass lesion, ascites or lymph node enlargement seen. 6. Of interest the density of the lumen of the renal pelvis bilaterally is increased on the left compared to the right in the portal venous phase. This is of uncertain etiology and significance. Please correlate with the urinalysis such as for  hematuria.   09/02/2022 Tumor Marker   Patient's tumor was tested for the following markers: CA-125. Results of the tumor marker test revealed 38.3.   09/12/2022 Imaging   CT CHEST ABDOMEN PELVIS W CONTRAST  Result Date: 09/12/2022 CLINICAL DATA:  Assess treatment response ovarian cancer * Tracking Code: BO * EXAM: CT CHEST, ABDOMEN, AND PELVIS WITH CONTRAST TECHNIQUE: Multidetector CT imaging of the chest, abdomen and pelvis was performed following the standard protocol during bolus administration of intravenous contrast. RADIATION DOSE REDUCTION: This exam was performed according to the departmental dose-optimization program which includes automated exposure control, adjustment of the mA and/or kV according to patient size and/or use of iterative reconstruction technique. CONTRAST:  OMNIPAQUE IOHEXOL 300 MG/ML  SOLN COMPARISON:  Abdomen pelvis CT 06/05/2022 and older. Chest CT 12/25/2021 and older. FINDINGS: CT CHEST FINDINGS Cardiovascular: Right upper chest port. Heart is nonenlarged. No pericardial effusion. Thoracic aorta has a normal course and caliber with mild atherosclerotic plaque. Mediastinum/Nodes: Stable thyroid gland. Slightly patulous thoracic esophagus with small hiatal hernia. No specific abnormal lymph node enlargement identified including axillary regions, hilum or mediastinum. No thoracic inlet abnormal nodes. One of the lymph nodes in the left axilla has  some calcification, unchanged from previous. Lungs/Pleura: Lungs are without consolidation, pneumothorax or effusion. Few areas of peripheral interstitial septal thickening, scarring and fibrotic change. Previously there were numerous cavitary nodules in both lungs. The extent and distribution appears overall decreasing. The residual cavitary lesions have thinner walls than previous from September 2023. Specific lesions will be followed for continuity and compared in measurement to that exam. The nodules that are seen on the more  recent study of the abdomen and pelvis from February 2024 also show improvement and wall thickness. Right upper lobe focus laterally which previously measured 6 mm in diameter, today is measured larger at 7 mm but again has a much thinner wall. Left upper lobe focus which previously has a diameter of 6 mm, today on series 4, image 53 has a diameter of 7 mm. Again much thinner wall. Less nodular. Focus right lower lobe laterally and posteriorly which previously measured 6 mm, today on series 4, image 116 measures 5 mm. Wall is thinner. And lastly nodule middle lobe posteriorly which measured 3 mm on the prior, today on series 4, image 76 is smaller at proximally 1-2 mm. Musculoskeletal: Mild curvature of the spine. Scattered degenerative changes. CT ABDOMEN PELVIS FINDINGS Hepatobiliary: Diffuse fatty liver infiltration. No space-occupying liver lesion. Patent portal vein. Previous cholecystectomy. Once again there is some thickening and calcification along falciform ligament which is similar on today's examination, series 2, image 52 for example. The nodule anterior to the left hepatic lobe towards the dome and margin of the diaphragm which previously measured 13 x 7 mm, today has some residual tissue measuring 10 x 4 mm. Additional focus posterior to segment 7 in the right hepatic lobe and laterally of also improved. Less thickening of the rind of soft tissue along the surface of the liver laterally. Pancreas: Global pancreatic atrophy without mass. Spleen: Preserved enhancement. Spleen previously was mildly enlarged at 13.1 cm cephalocaudal length and today measured at 12.7 cm. Adrenals/Urinary Tract: Adrenal glands are preserved. No enhancing renal mass or collecting system dilatation. Extrarenal pelvis seen to both kidneys. Stable Bosniak 1 exophytic left-sided renal cystic lesion. No specific imaging follow-up. The ureters have normal course and caliber down to the bladder. Preserved contours of the urinary  bladder. Stomach/Bowel: Large amount of diffuse colonic stool. Focal caliber change in the course of the colon along the proximal sigmoid colon, nonspecific. Stomach and small bowel are nondilated. Small hiatal hernia. Once again there is rectus muscle diastasis with protuberance of bowel loops and fat in this location at the level of the upper pelvis. Again no signs of obstruction. Vascular/Lymphatic: Scattered vascular calcifications. Normal caliber aorta and IVC. Of interest best seen on coronal imaging there is a slightly tortuous course of the portal vein with what may be a thin stricture or web as seen on sagittal series 5, image 68. In retrospect this was present and has a uncertain etiology and significance. Several prominent lymph nodes are again identified. Specific lesions will be followed. Upper retroperitoneal nodes of the celiac axis are again seen. Example which previously measured 12 x 12 mm with some calcification, today on series 2, image 56 measures 9 by 10 mm. Lesion just caudal to this which previously measured 19 x 10 mm, today measures 15 by 10 mm on series 2, image 57. Right-sided central mesenteric lymph node which previously measured 14 x 9 mm, today measures 10 x 5 mm on series 2, image 71. Central pelvic mesenteric node which previously measured 13 x 9  mm, today on series 2 image 94 measures 12 x 7 mm. This has a fatty hilum. Bilateral inguinal nodes with calcifications are seen. Left-sided focus previously measured 16 x 14 mm and today 13 x 11 mm. Right-sided inguinal focus which previously measured 17 x 14 mm, today series 2, image 115 measures 15 by 12 mm. Reproductive: Status post hysterectomy. No adnexal masses. Other: No free air or free fluid. The mesenteric cystic focus along the left side on the prior examination is not well seen today. Musculoskeletal: Curvature of the spine. Scattered degenerative changes of the spine and pelvis. IMPRESSION: Overall interval improvement.  Decreasing surface nodularity and thickening along the liver. Decreasing lymph nodes overall. No new ascites or lymph node enlargement. Previous pleural effusions no longer identified from the study of February 2024. Several cavitary lesions identified throughout the lungs continue to show decreasing wall thickening and nodularity. Some of the lesions measured larger than the study of September 2023 but this could be evolution of treatment with decreasing soft tissue components Fatty liver infiltration.  Borderline spleen size. Large amount of colonic stool Electronically Signed   By: Karen Kays M.D.   On: 09/12/2022 09:56      09/25/2022 -  Chemotherapy    She started taking zejula   10/04/2022 Tumor Marker   Patient's tumor was tested for the following markers: CA-125. Results of the tumor marker test revealed 23.9.   12/03/2022 Tumor Marker   Patient's tumor was tested for the following markers: CA-125. Results of the tumor marker test revealed 15.5.   01/14/2023 Imaging   CT CHEST ABDOMEN PELVIS W CONTRAST  Result Date: 01/16/2023 CLINICAL DATA:  Ovarian cancer, assess treatment response adenocarcinoma of right fallopian tube, chemotherapy complete * Tracking Code: BO * EXAM: CT CHEST, ABDOMEN, AND PELVIS WITH CONTRAST TECHNIQUE: Multidetector CT imaging of the chest, abdomen and pelvis was performed following the standard protocol during bolus administration of intravenous contrast. RADIATION DOSE REDUCTION: This exam was performed according to the departmental dose-optimization program which includes automated exposure control, adjustment of the mA and/or kV according to patient size and/or use of iterative reconstruction technique. CONTRAST:  85mL OMNIPAQUE IOHEXOL 300 MG/ML  SOLN COMPARISON:  09/10/2022 FINDINGS: CT CHEST FINDINGS Cardiovascular: Right chest port catheter. Aortic atherosclerosis. Normal heart size. No pericardial effusion. Mediastinum/Nodes: No enlarged mediastinal, hilar, or  axillary lymph nodes. Small hiatal hernia. Thyroid gland, trachea, and esophagus demonstrate no significant findings. Lungs/Pleura: Numerous small treated metastases throughout the lungs are unchanged, predominantly cystic and thin walled in character, for example in the anterior left upper lobe measuring 0.7 cm (series 4, image 61). Occasional small more solid nodules are unchanged, for example in the anterior left apex measuring 0.3 cm (series 4, image 20) and in the peripheral right apex measuring 0.3 cm (series 4, image 31). No pleural effusion or pneumothorax. Musculoskeletal: No chest wall abnormality. No acute osseous findings. CT ABDOMEN PELVIS FINDINGS Hepatobiliary: No focal liver abnormality is seen. Status post cholecystectomy. No biliary dilatation. Pancreas: Unremarkable. No pancreatic ductal dilatation or surrounding inflammatory changes. Spleen: Normal in size without significant abnormality. Adrenals/Urinary Tract: Adrenal glands are unremarkable. Kidneys are normal, without renal calculi, solid lesion, or hydronephrosis. Bladder is unremarkable. Stomach/Bowel: Stomach is within normal limits. Appendix appears normal. No evidence of bowel wall thickening, distention, or inflammatory changes. Status post omentectomy. Vascular/Lymphatic: Severe aortic atherosclerosis. Several lymph nodes are diminished in size, for example bilateral inguinal nodes, on the left measuring 0.9 x 0.6 cm, previously 1.3 x  1.1 cm (series 2, image 108). Other small treated lymph nodes throughout the abdomen and pelvis unchanged, many of which are faintly calcified. Reproductive: Status post hysterectomy and oophorectomy. Other: Low midline ventral hernia containing a nonobstructed loop of transverse colon (series 2, image 80). No ascites. Musculoskeletal: No acute osseous findings. IMPRESSION: 1. Numerous small treated metastases throughout the lungs are unchanged, predominantly cystic and thin walled in character.  Occasional small more solid nodules are unchanged. 2. Several nodal metastases throughout the abdomen and pelvis are diminished in size, consistent with treatment response. Other small treated lymph nodes throughout the abdomen and pelvis unchanged. 3. No residual perceptible peritoneal metastatic disease. 4. No new evidence of metastatic disease in the chest, abdomen, or pelvis. 5. Status post hysterectomy, oophorectomy, and omentectomy. 6. Low midline ventral hernia containing a nonobstructed loop of transverse colon. Aortic Atherosclerosis (ICD10-I70.0). Electronically Signed   By: Jearld Lesch M.D.   On: 01/16/2023 15:12        PHYSICAL EXAMINATION: ECOG PERFORMANCE STATUS: 1 - Symptomatic but completely ambulatory  Vitals:   01/17/23 0913  BP: (!) 156/74  Pulse: (!) 108  Resp: 18  Temp: (!) 97.5 F (36.4 C)  SpO2: 97%   Filed Weights   01/17/23 0913  Weight: 140 lb (63.5 kg)    GENERAL:alert, no distress and comfortable   LABORATORY DATA:  I have reviewed the data as listed    Component Value Date/Time   NA 137 01/17/2023 0851   NA 139 04/14/2017 0742   K 4.2 01/17/2023 0851   K 4.0 04/14/2017 0742   CL 104 01/17/2023 0851   CO2 26 01/17/2023 0851   CO2 21 (L) 04/14/2017 0742   GLUCOSE 117 (H) 01/17/2023 0851   GLUCOSE 247 (H) 04/14/2017 0742   BUN 17 01/17/2023 0851   BUN 13.1 04/14/2017 0742   CREATININE 1.40 (H) 01/17/2023 0851   CREATININE 0.83 08/29/2022 0754   CREATININE 0.9 04/14/2017 0742   CALCIUM 9.3 01/17/2023 0851   CALCIUM 9.2 04/14/2017 0742   PROT 6.3 (L) 01/17/2023 0851   PROT 7.3 04/14/2017 0742   ALBUMIN 3.9 01/17/2023 0851   ALBUMIN 3.9 04/14/2017 0742   AST 15 01/17/2023 0851   AST 13 (L) 08/29/2022 0754   AST 17 04/14/2017 0742   ALT 5 01/17/2023 0851   ALT 6 08/29/2022 0754   ALT 14 04/14/2017 0742   ALKPHOS 69 01/17/2023 0851   ALKPHOS 70 04/14/2017 0742   BILITOT 0.6 01/17/2023 0851   BILITOT 0.4 08/29/2022 0754   BILITOT 0.37  04/14/2017 0742   GFRNONAA 41 (L) 01/17/2023 0851   GFRNONAA >60 08/29/2022 0754   GFRAA >60 01/24/2020 0924   GFRAA >60 07/19/2019 0951    No results found for: "SPEP", "UPEP"  Lab Results  Component Value Date   WBC 4.8 01/17/2023   NEUTROABS 3.1 01/17/2023   HGB 10.6 (L) 01/17/2023   HCT 30.7 (L) 01/17/2023   MCV 101.0 (H) 01/17/2023   PLT 183 01/17/2023      Chemistry      Component Value Date/Time   NA 137 01/17/2023 0851   NA 139 04/14/2017 0742   K 4.2 01/17/2023 0851   K 4.0 04/14/2017 0742   CL 104 01/17/2023 0851   CO2 26 01/17/2023 0851   CO2 21 (L) 04/14/2017 0742   BUN 17 01/17/2023 0851   BUN 13.1 04/14/2017 0742   CREATININE 1.40 (H) 01/17/2023 0851   CREATININE 0.83 08/29/2022 0754   CREATININE 0.9  04/14/2017 0742      Component Value Date/Time   CALCIUM 9.3 01/17/2023 0851   CALCIUM 9.2 04/14/2017 0742   ALKPHOS 69 01/17/2023 0851   ALKPHOS 70 04/14/2017 0742   AST 15 01/17/2023 0851   AST 13 (L) 08/29/2022 0754   AST 17 04/14/2017 0742   ALT 5 01/17/2023 0851   ALT 6 08/29/2022 0754   ALT 14 04/14/2017 0742   BILITOT 0.6 01/17/2023 0851   BILITOT 0.4 08/29/2022 0754   BILITOT 0.37 04/14/2017 0742       RADIOGRAPHIC STUDIES: I have personally reviewed the radiological images as listed and agreed with the findings in the report. CT CHEST ABDOMEN PELVIS W CONTRAST  Result Date: 01/16/2023 CLINICAL DATA:  Ovarian cancer, assess treatment response adenocarcinoma of right fallopian tube, chemotherapy complete * Tracking Code: BO * EXAM: CT CHEST, ABDOMEN, AND PELVIS WITH CONTRAST TECHNIQUE: Multidetector CT imaging of the chest, abdomen and pelvis was performed following the standard protocol during bolus administration of intravenous contrast. RADIATION DOSE REDUCTION: This exam was performed according to the departmental dose-optimization program which includes automated exposure control, adjustment of the mA and/or kV according to patient size  and/or use of iterative reconstruction technique. CONTRAST:  85mL OMNIPAQUE IOHEXOL 300 MG/ML  SOLN COMPARISON:  09/10/2022 FINDINGS: CT CHEST FINDINGS Cardiovascular: Right chest port catheter. Aortic atherosclerosis. Normal heart size. No pericardial effusion. Mediastinum/Nodes: No enlarged mediastinal, hilar, or axillary lymph nodes. Small hiatal hernia. Thyroid gland, trachea, and esophagus demonstrate no significant findings. Lungs/Pleura: Numerous small treated metastases throughout the lungs are unchanged, predominantly cystic and thin walled in character, for example in the anterior left upper lobe measuring 0.7 cm (series 4, image 61). Occasional small more solid nodules are unchanged, for example in the anterior left apex measuring 0.3 cm (series 4, image 20) and in the peripheral right apex measuring 0.3 cm (series 4, image 31). No pleural effusion or pneumothorax. Musculoskeletal: No chest wall abnormality. No acute osseous findings. CT ABDOMEN PELVIS FINDINGS Hepatobiliary: No focal liver abnormality is seen. Status post cholecystectomy. No biliary dilatation. Pancreas: Unremarkable. No pancreatic ductal dilatation or surrounding inflammatory changes. Spleen: Normal in size without significant abnormality. Adrenals/Urinary Tract: Adrenal glands are unremarkable. Kidneys are normal, without renal calculi, solid lesion, or hydronephrosis. Bladder is unremarkable. Stomach/Bowel: Stomach is within normal limits. Appendix appears normal. No evidence of bowel wall thickening, distention, or inflammatory changes. Status post omentectomy. Vascular/Lymphatic: Severe aortic atherosclerosis. Several lymph nodes are diminished in size, for example bilateral inguinal nodes, on the left measuring 0.9 x 0.6 cm, previously 1.3 x 1.1 cm (series 2, image 108). Other small treated lymph nodes throughout the abdomen and pelvis unchanged, many of which are faintly calcified. Reproductive: Status post hysterectomy and  oophorectomy. Other: Low midline ventral hernia containing a nonobstructed loop of transverse colon (series 2, image 80). No ascites. Musculoskeletal: No acute osseous findings. IMPRESSION: 1. Numerous small treated metastases throughout the lungs are unchanged, predominantly cystic and thin walled in character. Occasional small more solid nodules are unchanged. 2. Several nodal metastases throughout the abdomen and pelvis are diminished in size, consistent with treatment response. Other small treated lymph nodes throughout the abdomen and pelvis unchanged. 3. No residual perceptible peritoneal metastatic disease. 4. No new evidence of metastatic disease in the chest, abdomen, or pelvis. 5. Status post hysterectomy, oophorectomy, and omentectomy. 6. Low midline ventral hernia containing a nonobstructed loop of transverse colon. Aortic Atherosclerosis (ICD10-I70.0). Electronically Signed   By: Bonna Gains.D.  On: 01/16/2023 15:12

## 2023-01-18 LAB — CA 125: Cancer Antigen (CA) 125: 11.6 U/mL (ref 0.0–38.1)

## 2023-01-20 ENCOUNTER — Encounter: Payer: Self-pay | Admitting: Hematology and Oncology

## 2023-02-21 ENCOUNTER — Telehealth: Payer: Self-pay | Admitting: Pharmacy Technician

## 2023-02-21 NOTE — Telephone Encounter (Signed)
Oral Oncology Patient Advocate Encounter   Received notification that patient is due for re-enrollment for assistance for Zejula through [employer].   Re-enrollment process has been initiated and will be submitted upon completion of necessary documents.  Patient agreed to sign documents 03/14/23 at the office.  [employer] phone number [phone]   I will continue to follow until final determination.  [name], CPhT-[facility]cology [facility] Patient Advocate [facility] Direct Number: [phone]  Fax: [phone]

## 2023-03-14 ENCOUNTER — Inpatient Hospital Stay (HOSPITAL_BASED_OUTPATIENT_CLINIC_OR_DEPARTMENT_OTHER): Payer: Medicare Other | Admitting: Hematology and Oncology

## 2023-03-14 ENCOUNTER — Encounter: Payer: Self-pay | Admitting: Hematology and Oncology

## 2023-03-14 ENCOUNTER — Inpatient Hospital Stay: Payer: Medicare Other | Attending: Hematology and Oncology

## 2023-03-14 VITALS — BP 155/68 | HR 91 | Temp 97.7°F | Resp 18 | Ht 65.0 in | Wt 140.0 lb

## 2023-03-14 DIAGNOSIS — C5701 Malignant neoplasm of right fallopian tube: Secondary | ICD-10-CM

## 2023-03-14 DIAGNOSIS — C7801 Secondary malignant neoplasm of right lung: Secondary | ICD-10-CM | POA: Insufficient documentation

## 2023-03-14 DIAGNOSIS — Z7189 Other specified counseling: Secondary | ICD-10-CM

## 2023-03-14 DIAGNOSIS — C7802 Secondary malignant neoplasm of left lung: Secondary | ICD-10-CM | POA: Diagnosis not present

## 2023-03-14 LAB — CBC WITH DIFFERENTIAL/PLATELET
Abs Immature Granulocytes: 0.01 10*3/uL (ref 0.00–0.07)
Basophils Absolute: 0.1 10*3/uL (ref 0.0–0.1)
Basophils Relative: 1 %
Eosinophils Absolute: 0.3 10*3/uL (ref 0.0–0.5)
Eosinophils Relative: 6 %
HCT: 32.1 % — ABNORMAL LOW (ref 36.0–46.0)
Hemoglobin: 11 g/dL — ABNORMAL LOW (ref 12.0–15.0)
Immature Granulocytes: 0 %
Lymphocytes Relative: 21 %
Lymphs Abs: 1 10*3/uL (ref 0.7–4.0)
MCH: 35.4 pg — ABNORMAL HIGH (ref 26.0–34.0)
MCHC: 34.3 g/dL (ref 30.0–36.0)
MCV: 103.2 fL — ABNORMAL HIGH (ref 80.0–100.0)
Monocytes Absolute: 0.4 10*3/uL (ref 0.1–1.0)
Monocytes Relative: 9 %
Neutro Abs: 3 10*3/uL (ref 1.7–7.7)
Neutrophils Relative %: 63 %
Platelets: 181 10*3/uL (ref 150–400)
RBC: 3.11 MIL/uL — ABNORMAL LOW (ref 3.87–5.11)
RDW: 12.2 % (ref 11.5–15.5)
WBC: 4.7 10*3/uL (ref 4.0–10.5)
nRBC: 0 % (ref 0.0–0.2)

## 2023-03-14 LAB — COMPREHENSIVE METABOLIC PANEL
ALT: 6 U/L (ref 0–44)
AST: 15 U/L (ref 15–41)
Albumin: 3.8 g/dL (ref 3.5–5.0)
Alkaline Phosphatase: 87 U/L (ref 38–126)
Anion gap: 7 (ref 5–15)
BUN: 16 mg/dL (ref 8–23)
CO2: 28 mmol/L (ref 22–32)
Calcium: 9.4 mg/dL (ref 8.9–10.3)
Chloride: 105 mmol/L (ref 98–111)
Creatinine, Ser: 1.36 mg/dL — ABNORMAL HIGH (ref 0.44–1.00)
GFR, Estimated: 42 mL/min — ABNORMAL LOW (ref >60)
Glucose, Bld: 123 mg/dL — ABNORMAL HIGH (ref 70–99)
Potassium: 3.9 mmol/L (ref 3.5–5.1)
Sodium: 140 mmol/L (ref 135–145)
Total Bilirubin: 0.5 mg/dL (ref <1.2)
Total Protein: 6.2 g/dL — ABNORMAL LOW (ref 6.5–8.1)

## 2023-03-14 MED ORDER — SODIUM CHLORIDE 0.9% FLUSH
10.0000 mL | Freq: Once | INTRAVENOUS | Status: AC
  Administered 2023-03-14: 10 mL

## 2023-03-14 MED ORDER — HEPARIN SOD (PORK) LOCK FLUSH 100 UNIT/ML IV SOLN
500.0000 [IU] | Freq: Once | INTRAVENOUS | Status: AC
  Administered 2023-03-14: 500 [IU]

## 2023-03-14 NOTE — Progress Notes (Signed)
[facility] OFFICE PROGRESS NOTE  Patient Care Team: Lupita Raider, MD as PCP - General (Family Medicine) Artis Delay, MD as Consulting Physician (Hematology and Oncology) Adolphus Birchwood, MD as Consulting Physician (Obstetrics and Gynecology)  HISTORY OF PRESENTING ILLNESS: Discussed the use of AI scribe software for clinical note transcription with the patient, who gave verbal consent to proceed.  History of Present Illness   The patient, with a history of hypertension and recurrent fallopian tube cancer managed with Zejula, reports a recent episode of suspected food poisoning characterized by a day of vomiting followed by a day of diarrhea. She denies any current abdominal discomfort.  The patient has been monitoring her blood pressure at home, noting that recent readings have been consistently below 140, though she admits to occasional non-adherence to her antihypertensive regimen when feeling well. Despite this, she reports several recent readings above 120, which she considers out of the ordinary.  She has been managing her hypertension with a low-salt diet and a regimen. She has Atenolol 25mg  at home. She reports no issues with pain, cough, or infection. The patient also mentions a change in the timing of her Zejula dose from evening to morning due to initial insomnia, with no subsequent problems reported.  The patient's last CT scan was at the end of September, with a follow-up scan planned for after Christmas. She also reports a history of a hernia, which has been left untreated.         Assessment and Plan    Recurrent fallopian tube cancer Last CT scan was in September. No current symptoms of concern. -Schedule CT scan for 04/17/2023. -Plan to review results on follow-up visit on 04/29/2023.    Hypertension Blood pressure readings have been inconsistent, with some readings above 120. Patient admits to inconsistent medication adherence. -Resume Atenolol 25mg   daily.  Gastrointestinal upset Patient reports a recent episode of vomiting and diarrhea, likely due to food poisoning or a viral illness. No current symptoms. -No specific intervention needed at this time.  General Health Maintenance / Follow-up Plans -Continue current regimen of Zejula, with timing adjusted to morning to reduce insomnia. -Port flush and CT scan scheduled for 04/17/2023. -Follow-up appointment scheduled for 04/29/2023.          Orders Placed This Encounter  Procedures   CT CHEST ABDOMEN PELVIS W CONTRAST    Standing Status:   Future    Standing Expiration Date:   03/13/2024    Order Specific Question:   If indicated for the ordered procedure, I authorize the administration of contrast media per Radiology protocol    Answer:   Yes    Order Specific Question:   Does the patient have a contrast media/X-ray dye allergy?    Answer:   No    Order Specific Question:   Preferred imaging location?    Answer:   [facility]    Order Specific Question:   If indicated for the ordered procedure, I authorize the administration of oral contrast media per Radiology protocol    Answer:   No    Order Specific Question:   Reason for no oral contrast:    Answer:   no need oral contrast    All questions were answered. The patient knows to call the clinic with any problems, questions or concerns. The total time spent in the appointment was 30 minutes encounter with patients including review of chart and various tests results, discussions about plan of care and coordination of  care plan   Artis Delay, MD 03/14/2023 3:03 PM  REVIEW OF SYSTEMS:  All other systems were reviewed with the patient and are negative.  I have reviewed the past medical history, past surgical history, social history and family history with the patient and they are unchanged from previous note.  ALLERGIES:  is allergic to dilaudid [hydromorphone hcl], morphine and codeine, and sudafed [pseudoephedrine  hcl].  MEDICATIONS:  Current Outpatient Medications  Medication Sig Dispense Refill   atenolol (TENORMIN) 25 MG tablet Take 25 mg by mouth daily.     ALAWAY 0.035 % ophthalmic solution Place 1 drop into both eyes 2 (two) times daily as needed (for irritation).     docusate sodium (COLACE) 100 MG capsule Take 100 mg by mouth daily as needed for mild constipation.     lidocaine-prilocaine (EMLA) cream Apply to affected area once daily as directed. (Patient taking differently: 1 application  daily as needed (as directed- to access port).) 30 g 3   MIRALAX 17 GM/SCOOP powder Take 17 g by mouth daily as needed for mild constipation.     niraparib tosylate (ZEJULA) 100 MG tablet Take 1 tablet (100 mg total) by mouth daily. May take at bedtime to reduce nausea and vomiting. 30 tablet 3   ondansetron (ZOFRAN) 8 MG tablet Take 1 tablet (8 mg total) by mouth every 8 (eight) hours as needed for nausea or vomiting. Start on the third day after chemotherapy. 30 tablet 1   prochlorperazine (COMPAZINE) 10 MG tablet Take 1 tablet (10 mg total) by mouth every 6 (six) hours as needed for nausea or vomiting. 30 tablet 1   RESTASIS 0.05 % ophthalmic emulsion Place 1 drop into both eyes 2 (two) times daily.     sodium chloride (OCEAN) 0.65 % SOLN nasal spray Place 1 spray into both nostrils as needed for congestion.     triamcinolone cream (KENALOG) 0.1 % Apply 1 Application topically 2 (two) times daily.     TYLENOL 8 HOUR ARTHRITIS PAIN 650 MG CR tablet Take 650-1,300 mg by mouth every 8 (eight) hours as needed for pain.     venlafaxine XR (EFFEXOR-XR) 37.5 MG 24 hr capsule TAKE 1 CAPSULE BY MOUTH DAILY WITH BREAKFAST. (Patient taking differently: Take 37.5 mg by mouth daily with breakfast.) 90 capsule 3   No current facility-administered medications for this visit.    SUMMARY OF ONCOLOGIC HISTORY: Oncology History Overview Note  Neg genetics High grade serous    Adenocarcinoma of right fallopian tube (HCC)   08/12/2016 Initial Diagnosis   The patient has many months of vague symptoms of fullness in the pelvis, urinary frequency and difficulty with defecation. She denies vaginal bleeding or rectal bleeding.     08/12/2016 Imaging   Abdomen X-ray in the ER: Moderate colonic stool burden without evidence of enteric obstruction   11/13/2016 Imaging   TVUS was performed on 11/13/16 which showed a large hypoechoic lobular mass seen posterior to the uterus measuring 18.2x16.1x11.8cm, blood flow was seen along the periphery of the mass. It was considered to be likely to be a fibroid vs pelvic mass vs ovarian mass. Neither ovary was removed. Moderate amount of free fluid in the pelvis. THe uterus measures 4x10x4cm. The endometrium is 4mm.    12/05/2016 Imaging   MR pelvis 1. Mild limitations as detailed above. 2. Heterogeneous pelvic mass is favored to arise from the right ovary. Favor a solid ovarian neoplasm such as fibroma/Brenner's tumor. Given lesion size and heterogeneous T2 signal  out, complicating torsion cannot be excluded. 3. Moderate abdominopelvic ascites, without specific evidence of peritoneal metastasis. Consider further evaluation with contrast-enhanced abdominopelvic CT.    12/26/2016 Pathology Results   1. Uterus, ovaries and fallopian tubes - ADENOCARCINOMA INVOLVING RIGHT FALLOPIAN TUBE, RIGHT AND LEFT OVARIES, UTERINE SEROSA AND PELVIC MASS. - SEE ONCOLOGY TABLE AND COMMENT. - UTERINE CERVIX, ENDOMETRIUM, MYOMETRIUM AND LEFT FALLOPIAN TUBE FREE OF TUMOR. 2. Omentum, resection for tumor - ADENOCARCINOMA. Microscopic Comment 1. ONCOLOGY TABLE - FALLOPIAN TUBE. SEE COMMENT 1. Specimen, including laterality: Uterus, bilateral adnexa, pelvic mass and omentum. 2. Procedure: Hysterectomy with bilateral salpingo-oophorectomy, pelvic mass excision and omentectomy. 3. Lymph node sampling performed: No 4. Tumor site: See comment. 5. Tumor location in fallopian tube: Right fallopian tube  fimbria 6. Specimen integrity (intact/ruptured/disrupted): Intact 7. Tumor size (cm): 18 cm, see comment. 8. Histologic type: Adenocarcinoma 9. Grade: High grade 10. Microscopic tumor extension: Tumor involves right fallopian tube, right and left ovaries, uterine serosa, pelvic mass and omentum. 11. Margins: See comment. 12. Lymph-Vascular invasion: Present 13. Lymph nodes: # examined: 0; # positive: N/A 14. TNM: pT3c, pNX 15. FIGO Stage (based on pathologic findings, needs clinical correlation: III-C 16. Comments: There is an 18 cm pelvic mass which is a high grade adenocarcinoma and there is a 12.2 cm  segment of omentum which is extensively involved with adenocarcinoma. The tumor also involves the fimbria of the right fallopian tube and the parenchyma of the right and left ovaries as well as uterine serosa. The fimbria of the right fallopian has intraepithelial atypia consistent with a precursor lesion and therefore this is most consistent with primary fallopian tube adenocarcinoma. A primary peritoneal serous carcinoma is a less likely possibility.    12/26/2016 Pathology Results   PERITONEAL/ASCITIC FLUID(SPECIMEN 1 OF 1 COLLECTED 12/26/16): POORLY DIFFERENTIATED ADENOCARCINOMA   12/26/2016 Surgery   Procedure(s) Performed: Exploratory laparotomy with total abdominal hysterectomy, bilateral salpingo-oophorectomy, omentectomy radical tumor debulking for ovarian cancer .   Surgeon: Luisa Dago, MD.      Operative Findings: 20cm left ovarian mass densely adherent to the posterior uterus, right tube and ovary, cervix, sigmoid colon. 10cm omental cake. 4L ascites. 1mm size tumor nodules on the serosa of the terminal ileum and proximal sigmoid colon. 1mm nodules on right diaphragm.    This represented an optimal cytoreduction (R1) with 1mm nodules on intestine and diaphragm representing gross visible disease   01/16/2017 Procedure   Placement of single lumen port a cath via right internal jugular  vein. The catheter tip lies at the cavo-atrial junction. A power injectable port a cath was placed and is ready for immediate use.   01/17/2017 Imaging   1. 3.5 x 7.2 x 4.6 cm fluid collection along the vaginal cuff, posterior the bladder and extending into the right adnexal space. This lesion demonstrates rim enhancement. Imaging features could be related to a loculated postoperative seroma or hematoma. Superinfection cannot be excluded by CT. Given debulking surgery was 3 weeks ago, this entire structure is un likely to represent neoplasm, but peritoneal involvement could have this appearance. 2. Small fluid collection in the left para colic gutter without rim enhancement. 3. Irregular/nodular appearance of the peritoneal M in the anatomic pelvis with areas of subtle nodularity between the stomach and the spleen. Close attention in these regions on follow-up recommended as metastatic disease is a concern. 4. Mildly enlarged hepatoduodenal ligament lymph node. Attention on follow-up recommended.   01/20/2017 Tumor Marker   Patient's tumor was tested for  the following markers: CA-125 Results of the tumor marker test revealed 46.6   01/28/2017 Genetic Testing       Negative genetic testing on the [facility] panel.  The [facility] gene panel offered by [employer] includes sequencing and deletion/duplication testing of the following 28 genes: APC, ATM, BARD1, BMPR1A, BRCA1, BRCA2, BRIP1, CHD1, CDK4, CDKN2A, CHEK2, EPCAM (large rearrangement only), MLH1, MSH2, MSH6, MUTYH, NBN, PALB2, PMS2, PTEN, RAD51C, RAD51D, SMAD4, STK11, and TP53. Sequencing was performed for select regions of POLE and POLD1, and large rearrangement analysis was performed for select regions of GREM1. The report date is January 28, 2017.  HRD testing looking for genomic instability and BRCA mutations was negative.  The report date of this test is January 27, 2017.    01/31/2017 Tumor Marker   Patient's tumor was  tested for the following markers: CA-125 Results of the tumor marker test revealed 22.4   03/04/2017 Tumor Marker   Patient's tumor was tested for the following markers: CA-125 Results of the tumor marker test revealed 13.8   04/18/2017 Tumor Marker   Patient's tumor was tested for the following markers: CA-125 Results of the tumor marker test revealed 11.9   05/05/2017 Tumor Marker   Patient's tumor was tested for the following markers: CA-125 Results of the tumor marker test revealed 12   05/26/2017 Tumor Marker   Patient's tumor was tested for the following markers: CA-125 Results of the tumor marker test revealed 10.6   05/26/2017 Imaging   1. A previously noted postoperative fluid collection in the low pelvis and residual ascites seen on the prior examination has resolved on today's study. No findings to suggest residual/recurrent disease on today's examination. No definite solid organ metastasis identified in the abdomen or pelvis. No lymphadenopathy. 2. Aortic atherosclerosis.   06/10/2017 Procedure   Successful right IJ vein Port-A-Cath explant.   03/09/2018 Tumor Marker   Patient's tumor was tested for the following markers: CA-125 Results of the tumor marker test revealed 12.1   05/26/2018 Tumor Marker   Patient's tumor was tested for the following markers: CA-125 Results of the tumor marker test revealed 13.8   09/09/2018 Tumor Marker   Patient's tumor was tested for the following markers: CA-125 Results of the tumor marker test revealed 23.9   12/18/2018 Tumor Marker   Patient's tumor was tested for the following markers: CA-125 Results of the tumor marker test revealed 43.8   02/27/2019 - 02/28/2019 Hospital Admission   She was admitted for bowel obstruction   02/27/2019 Imaging   1. High-grade small bowel obstruction with transition point in the pelvis in an area of suspected desmoplastic reaction surrounding a 1.3 cm spiculated nodule in the mesentery, suspicious for  carcinoid. There is hyperenhancement near the tip of the appendix and loss of the normal fat plane between it and the adjacent sigmoid colon, potentially the site of primary lesion. 2. Multiple new small subcentimeter hyperdense lesions scattered along the liver capsule, concerning for metastases. Enlarged hyperenhancing gastrohepatic and portacaval lymph nodes concerning for nodal metastases. 3. Very mild right hydroureteronephrosis to the level of the desmoplastic reaction in the pelvis, potentially involving the right ureter. 4. Infraumbilical ventral abdominal wall diastasis containing a small nondilated portion of transverse colon. 5. Trace ascites. 6. Cholelithiasis. 7.  Aortic atherosclerosis (ICD10-I70.0).     03/05/2019 Tumor Marker   Patient's tumor was tested for the following markers: CA-125 Results of the tumor marker test revealed 70.1   03/12/2019 Echocardiogram   IMPRESSIONS  1. Left ventricular ejection fraction, by visual estimation, is 60 to 65%. The left ventricle has normal function. There is no left ventricular hypertrophy.  2. Left ventricular diastolic parameters are consistent with Grade I diastolic dysfunction (impaired relaxation).  3. Global right ventricle has normal systolic function.The right ventricular size is normal. No increase in right ventricular wall thickness.  4. Left atrial size was normal.  5. Right atrial size was normal.  6. The pericardium was not assessed.  7. The mitral valve is normal in structure. No evidence of mitral valve regurgitation.  8. The tricuspid valve is normal in structure. Tricuspid valve regurgitation is trivial.  9. The aortic valve is normal in structure. Aortic valve regurgitation is not visualized. 10. The pulmonic valve was grossly normal. Pulmonic valve regurgitation is not visualized. 11. The average left ventricular global longitudinal strain is -17.6 %.   03/16/2019 Procedure   Successful placement of a right  internal jugular approach power injectable Port-A-Cath. The catheter is ready for immediate use.     04/19/2019 Tumor Marker   Patient's tumor was tested for the following markers: CA-125 Results of the tumor marker test revealed 39.8   05/17/2019 Tumor Marker   Patient's tumor was tested for the following markers: CA-125 Results of the tumor marker test revealed 27   05/28/2019 Tumor Marker   Patient's tumor was tested for the following markers: CA-125 Results of the tumor marker test revealed 27.7.   06/11/2019 Imaging   1. No new or progressive metastatic disease in the abdomen or pelvis. 2. Tiny noncalcified perisplenic implant is decreased. Calcified pericardiophrenic, retroperitoneal and bilateral inguinal lymph nodes and scattered small calcified perihepatic and perisplenic implants are stable. 3.  Aortic Atherosclerosis (ICD10-I70.0).       06/17/2019 Echocardiogram    1. Left ventricular ejection fraction, by estimation, is 65 to 70%. The left ventricle has normal function. The left ventricle has no regional wall motion abnormalities. Left ventricular diastolic parameters were normal.  2. Right ventricular systolic function is normal. The right ventricular size is normal.  3. The mitral valve is normal in structure and function. No evidence of mitral valve regurgitation. No evidence of mitral stenosis.  4. The aortic valve is normal in structure and function. Aortic valve regurgitation is not visualized. No aortic stenosis is present.   06/21/2019 Tumor Marker   Patient's tumor was tested for the following markers: CA-125 Results of the tumor marker test revealed 18.7   07/19/2019 Tumor Marker   Patient's tumor was tested for the following markers: CA-125 Results of the tumor marker test revealed 16.7   08/30/2019 Tumor Marker   Patient's tumor was tested for the following markers: CA-125. Results of the tumor marker test revealed 13.9   09/15/2019 Echocardiogram    1. Left  ventricular ejection fraction, by estimation, is 65 to 70%. The left ventricle has normal function. The left ventricle has no regional wall motion abnormalities. Left ventricular diastolic parameters are consistent with Grade I diastolic dysfunction (impaired relaxation). The average left ventricular global longitudinal strain is 18.1 %. The global longitudinal strain is normal.  2. Right ventricular systolic function is normal. The right ventricular size is normal.  3. The mitral valve is normal in structure. No evidence of mitral valve regurgitation. No evidence of mitral stenosis.  4. The aortic valve is normal in structure. Aortic valve regurgitation is not visualized. No aortic stenosis is present.  5. The inferior vena cava is normal in size with greater than 50%  respiratory variability, suggesting right atrial pressure of 3 mmHg.     09/24/2019 Imaging   1. Stable exam. No new or progressive metastatic disease on today's study. 2. Small subcapsular hypodensity in the lateral spleen, new in the interval, but indeterminate. Attention on follow-up recommended. 3. The calcified upper abdominal and groin lymph nodes are stable as are the scattered calcified and noncalcified peritoneal implants. 4. Stable midline ventral hernia containing a short segment of colon without complicating features. 5. Aortic Atherosclerosis (ICD10-I70.0).     10/04/2019 - 09/21/2021 Chemotherapy   Patient is on Treatment Plan : ovarian Bevacizumab q21d     10/04/2019 Tumor Marker   Patient's tumor was tested for the following markers: CA-125. Results of the tumor marker test revealed 12.9.   11/01/2019 Tumor Marker   Patient's tumor was tested for the following markers: CA-125 Results of the tumor marker test revealed 15.4   12/13/2019 Tumor Marker   Patient's tumor was tested for the following markers: CA-125 Results of the tumor marker test revealed 14.8   12/31/2019 Imaging   1. Unchanged tiny peritoneal nodule  adjacent to the spleen measuring 6 mm. Other previously noted peritoneal nodules are imperceptible on present examination. 2. Unchanged prominent, partially calcified portacaval lymph node and bilateral inguinal lymph nodes. 3. Unchanged subtle, nonspecific subcapsular lesion of the spleen.  4. No evidence of new metastatic disease in the abdomen or pelvis. 5. Status post hysterectomy. 6. Unchanged low midline ventral abdominal hernia containing a single loop of nonobstructed transverse colon. 7. Cholelithiasis. 8. Aortic Atherosclerosis (ICD10-I70.0).       01/03/2020 Tumor Marker   Patient's tumor was tested for the following markers: CA-125 Results of the tumor marker test revealed 11.7.   01/24/2020 Tumor Marker   Patient's tumor was tested for the following markers: CA-125. Results of the tumor marker test revealed 15.1   03/06/2020 Tumor Marker   Patient's tumor was tested for the following markers: CA-125. Results of the tumor marker test revealed 20.3   03/27/2020 Tumor Marker   Patient's tumor was tested for the following markers: CA-125 Results of the tumor marker test revealed 23.7   05/11/2020 Tumor Marker   Patient's tumor was tested for the following markers: CA-125. Results of the tumor marker test revealed 25.4   06/23/2020 Imaging   1. Cholelithiasis with gallbladder wall thickening and mild infiltrative edema in the porta hepatis, raising the possibility of acute cholecystitis. There is truncation of the common bile duct distally and distal choledocholithiasis is difficult to exclude. Correlate with bilirubin levels. If clinically warranted, MRCP could be utilized for further characterization. 2. Stable tiny perisplenic nodule. 3. Other imaging findings of potential clinical significance: Small type 1 hiatal hernia. Prominent stool throughout the colon favors constipation. Ventral infraumbilical hernia contains transverse colon without findings of strangulation or  obstruction. Small supraumbilical hernia contains adipose tissue. Lumbar degenerative disc disease at L5-S1. Stable tiny nodule along the lateral margin of the spleen, nonspecific. 4. Aortic atherosclerosis.     12/15/2020 Imaging   No evidence of recurrent or metastatic carcinoma within the abdomen or pelvis.   Stable ventral hernia containing transverse colon. No evidence of bowel obstruction or strangulation.   Cholelithiasis. No radiographic evidence of cholecystitis.   Tiny hiatal hernia.   Aortic Atherosclerosis (ICD10-I70.0).     06/11/2021 Imaging   1. Numerous sub 4 mm pulmonary nodules in the lungs some of which have a cavitary appearance. I think it is unlikely this is metastatic disease and more  likely a possible immunotherapy related process such as a sarcoid like reaction. Recommend full chest CT further evaluation. 2. Stable small calcified retroperitoneal and mesenteric lymph nodes. No findings suspicious for recurrent peritoneal carcinomatosis. 3. Stable lower abdominal wall hernia containing part of the transverse colon. 4. Cholelithiasis.   09/27/2021 Imaging   1. Numerous small pulmonary nodules throughout the lungs, the majority of which are cavitary. These are all subcentimeter, however nodules that were seen in the bilateral lung bases on prior CT dated 06/08/2021 appear slightly increased in size and number. Behavior is highly suspicious for pulmonary metastatic disease despite the unusual appearance, however as previously reported general differential considerations include atypical infection and inflammation, such as drug-induced sarcoidosis like reaction.  2. Unchanged calcified lymph nodes in the abdomen and pelvis, consistent with treated nodal metastases. 3. Cholelithiasis with wall thickening and pericholecystic fluid. This appearance is similar to prior examination and suggests chronic cholecystitis. Correlate for clinical symptoms. Mild intrahepatic biliary  ductal dilatation, similar to prior examination. 4. Midline ventral hernia containing a single nonobstructed loop of mid transverse colon.     12/25/2021 Imaging   1. Again noted are multiple thin walled cavitary lung nodules throughout both lungs. These are similar in size and multiplicity when compared with the exam from 09/26/2021. Etiology remains indeterminate, although metastatic disease cannot be excluded with a high degree of certainty. 2. Stable calcified upper abdominal lymph nodes consistent with treated nodal metastases. 3. Gallstones. 4. Aortic Atherosclerosis (ICD10-I70.0).   01/30/2022 Tumor Marker   Patient's tumor was tested for the following markers: CA-125. Results of the tumor marker test revealed 59.3.   01/31/2022 Imaging   1. No substantial interval change in exam. 2. Innumerable tiny cavitary pulmonary nodules in the lung bases. As noted previously, metastatic disease would be distinct consideration although imaging features are nonspecific and infectious/inflammatory etiology is not excluded. 3. Calcified nodal disease in the upper abdomen and pelvis shows no substantial interval change. Scattered noncalcified nodules in the mesentery are similar to prior. 4. Markedly large stool volume throughout the colon. Imaging features suggestive of clinical constipation. 5. Cholelithiasis with ill-defined appearance of the gallbladder wall. Appearance is stable in the interval. 6. Trace free fluid in the cul-de-sac. 7. Midline ventral hernia contains a short segment of transverse colon without complicating features. 8. Aortic Atherosclerosis (ICD10-I70.0).   03/07/2022 Surgery   Procedure Date:  03/07/2022  Pre-operative Diagnosis:  Symptomatic cholelithiasis   Post-operative Diagnosis: Symptomatic cholelithiasis, peritoneal implants with history of right fallopian tube cancer.   Procedure:   Robotic assisted cholecystectomy with ICG FireFly cholangiogram Robotic  assisted abdominal wall peritoneal implant excisional biopsy x 3.   Surgeon:  Howie Ill, MD  Specimens:   Gallbladder Peritoneal implants x 3    Indications for Procedure:  This is a 67 y.o. female who presents with abdominal pain and workup revealing symptomatic cholelithiasis.  The benefits, complications, treatment options, and expected outcomes were discussed with the patient. The risks of bleeding, infection, recurrence of symptoms, failure to resolve symptoms, bile duct damage, bile duct leak, retained common bile duct stone, bowel injury, and need for further procedures were all discussed with the patient and she was willing to proceed.     03/07/2022 Pathology Results   SURGICAL PATHOLOGY  CASE: [phone]  PATIENT: Edwin Cap  Surgical Pathology Report   Specimen Submitted:  A. Gallbladder  B. Abdominal wall   Clinical History: Symptomatic cholelithiasis   DIAGNOSIS:  A. GALLBLADDER; CHOLECYSTECTOMY:  - CHRONIC CHOLECYSTITIS  WITH CHOLELITHIASIS.  - NEGATIVE FOR DYSPLASIA AND MALIGNANCY.  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   B. SOFT TISSUE, ABDOMINAL WALL; BIOPSY:  - POSITIVE FOR MALIGNANCY.  - METASTATIC CARCINOMA, COMPATIBLE WITH HIGH-GRADE SEROUS CARCINOMA OF GYNECOLOGIC PRIMARY.   Comment:  Immunohistochemical studies were performed on neoplastic cells in both sources. Tumor cells in both are positive for CK7, Pax-8, and WT-1, supporting the above diagnosis. P53 demonstrates strong and diffuse expression, indicative of mutated expression pattern.   There is sufficient tissue present for ancillary molecular testing if desired.    04/04/2022 Imaging   1. Mild progression of partially calcified peritoneal metastatic implants surrounding the liver, in the porta hepatis and in the periumbilical region. No generalized ascites or peritoneal nodularity status post  omentectomy. 2. Grossly stable cavitary lesions at both lung bases, presumably reflecting metastatic disease. 3. No other evidence of progressive metastatic disease within the abdomen or pelvis. 4. Enlarging ventral hernia containing a portion of the transverse colon. No evidence of bowel obstruction or inflammation. 5.  Aortic Atherosclerosis (ICD10-I70.0).   04/08/2022 - 08/31/2022 Chemotherapy   Patient is on Treatment Plan : OVARIAN Carboplatin + Paclitaxel + Bevacizumab q21d      04/09/2022 Tumor Marker   Patient's tumor was tested for the following markers: CA-125. Results of the tumor marker test revealed 86.7.   05/22/2022 Tumor Marker   Patient's tumor was tested for the following markers: CA-125. Results of the tumor marker test revealed 49.7.   06/05/2022 Imaging   1. New tiny bilateral pleural effusions. 2. Once again there are several small cavitary lesions in the lungs. Many of these are becoming more cavitary with less soft tissue elements and are smaller today. 3. Soft tissue thickening and nodularity along the surface of the liver appears to be overall decreasing from previous examination. Some of these areas have calcification. 4. Mesenteric nodules as well as retroperitoneal and mesenteric nodes appear similar to slightly improved. The inguinal nodes seen previously are similar. 5. No developing new mass lesion, ascites or lymph node enlargement seen. 6. Of interest the density of the lumen of the renal pelvis bilaterally is increased on the left compared to the right in the portal venous phase. This is of uncertain etiology and significance. Please correlate with the urinalysis such as for hematuria.   09/02/2022 Tumor Marker   Patient's tumor was tested for the following markers: CA-125. Results of the tumor marker test revealed 38.3.   09/12/2022 Imaging   CT CHEST ABDOMEN PELVIS W CONTRAST  Result Date: 09/12/2022 CLINICAL DATA:  Assess treatment response ovarian  cancer * Tracking Code: BO * EXAM: CT CHEST, ABDOMEN, AND PELVIS WITH CONTRAST TECHNIQUE: Multidetector CT imaging of the chest, abdomen and pelvis was performed following the standard protocol during bolus administration of intravenous contrast. RADIATION DOSE REDUCTION: This exam was performed according to the departmental dose-optimization program which includes automated exposure control, adjustment of the mA and/or kV according to patient size and/or use of iterative reconstruction technique. CONTRAST:  OMNIPAQUE IOHEXOL 300 MG/ML  SOLN COMPARISON:  Abdomen pelvis CT 06/05/2022 and older. Chest CT 12/25/2021 and older. FINDINGS: CT CHEST FINDINGS Cardiovascular: Right upper chest port. Heart is nonenlarged. No pericardial effusion. Thoracic aorta has a normal course and caliber with mild atherosclerotic plaque. Mediastinum/Nodes: Stable thyroid gland. Slightly patulous thoracic esophagus with small hiatal hernia. No specific abnormal lymph node enlargement identified including axillary regions, hilum or mediastinum. No thoracic  inlet abnormal nodes. One of the lymph nodes in the left axilla has some calcification, unchanged from previous. Lungs/Pleura: Lungs are without consolidation, pneumothorax or effusion. Few areas of peripheral interstitial septal thickening, scarring and fibrotic change. Previously there were numerous cavitary nodules in both lungs. The extent and distribution appears overall decreasing. The residual cavitary lesions have thinner walls than previous from September 2023. Specific lesions will be followed for continuity and compared in measurement to that exam. The nodules that are seen on the more recent study of the abdomen and pelvis from February 2024 also show improvement and wall thickness. Right upper lobe focus laterally which previously measured 6 mm in diameter, today is measured larger at 7 mm but again has a much thinner wall. Left upper lobe focus which previously has a  diameter of 6 mm, today on series 4, image 53 has a diameter of 7 mm. Again much thinner wall. Less nodular. Focus right lower lobe laterally and posteriorly which previously measured 6 mm, today on series 4, image 116 measures 5 mm. Wall is thinner. And lastly nodule middle lobe posteriorly which measured 3 mm on the prior, today on series 4, image 76 is smaller at proximally 1-2 mm. Musculoskeletal: Mild curvature of the spine. Scattered degenerative changes. CT ABDOMEN PELVIS FINDINGS Hepatobiliary: Diffuse fatty liver infiltration. No space-occupying liver lesion. Patent portal vein. Previous cholecystectomy. Once again there is some thickening and calcification along falciform ligament which is similar on today's examination, series 2, image 52 for example. The nodule anterior to the left hepatic lobe towards the dome and margin of the diaphragm which previously measured 13 x 7 mm, today has some residual tissue measuring 10 x 4 mm. Additional focus posterior to segment 7 in the right hepatic lobe and laterally of also improved. Less thickening of the rind of soft tissue along the surface of the liver laterally. Pancreas: Global pancreatic atrophy without mass. Spleen: Preserved enhancement. Spleen previously was mildly enlarged at 13.1 cm cephalocaudal length and today measured at 12.7 cm. Adrenals/Urinary Tract: Adrenal glands are preserved. No enhancing renal mass or collecting system dilatation. Extrarenal pelvis seen to both kidneys. Stable Bosniak 1 exophytic left-sided renal cystic lesion. No specific imaging follow-up. The ureters have normal course and caliber down to the bladder. Preserved contours of the urinary bladder. Stomach/Bowel: Large amount of diffuse colonic stool. Focal caliber change in the course of the colon along the proximal sigmoid colon, nonspecific. Stomach and small bowel are nondilated. Small hiatal hernia. Once again there is rectus muscle diastasis with protuberance of bowel  loops and fat in this location at the level of the upper pelvis. Again no signs of obstruction. Vascular/Lymphatic: Scattered vascular calcifications. Normal caliber aorta and IVC. Of interest best seen on coronal imaging there is a slightly tortuous course of the portal vein with what may be a thin stricture or web as seen on sagittal series 5, image 68. In retrospect this was present and has a uncertain etiology and significance. Several prominent lymph nodes are again identified. Specific lesions will be followed. Upper retroperitoneal nodes of the celiac axis are again seen. Example which previously measured 12 x 12 mm with some calcification, today on series 2, image 56 measures 9 by 10 mm. Lesion just caudal to this which previously measured 19 x 10 mm, today measures 15 by 10 mm on series 2, image 57. Right-sided central mesenteric lymph node which previously measured 14 x 9 mm, today measures 10 x 5 mm on series  2, image 71. Central pelvic mesenteric node which previously measured 13 x 9 mm, today on series 2 image 94 measures 12 x 7 mm. This has a fatty hilum. Bilateral inguinal nodes with calcifications are seen. Left-sided focus previously measured 16 x 14 mm and today 13 x 11 mm. Right-sided inguinal focus which previously measured 17 x 14 mm, today series 2, image 115 measures 15 by 12 mm. Reproductive: Status post hysterectomy. No adnexal masses. Other: No free air or free fluid. The mesenteric cystic focus along the left side on the prior examination is not well seen today. Musculoskeletal: Curvature of the spine. Scattered degenerative changes of the spine and pelvis. IMPRESSION: Overall interval improvement. Decreasing surface nodularity and thickening along the liver. Decreasing lymph nodes overall. No new ascites or lymph node enlargement. Previous pleural effusions no longer identified from the study of February 2024. Several cavitary lesions identified throughout the lungs continue to show  decreasing wall thickening and nodularity. Some of the lesions measured larger than the study of September 2023 but this could be evolution of treatment with decreasing soft tissue components Fatty liver infiltration.  Borderline spleen size. Large amount of colonic stool Electronically Signed   By: Karen Kays M.D.   On: 09/12/2022 09:56      09/25/2022 -  Chemotherapy    She started taking zejula   10/04/2022 Tumor Marker   Patient's tumor was tested for the following markers: CA-125. Results of the tumor marker test revealed 23.9.   12/03/2022 Tumor Marker   Patient's tumor was tested for the following markers: CA-125. Results of the tumor marker test revealed 15.5.   01/14/2023 Imaging   CT CHEST ABDOMEN PELVIS W CONTRAST  Result Date: 01/16/2023 CLINICAL DATA:  Ovarian cancer, assess treatment response adenocarcinoma of right fallopian tube, chemotherapy complete * Tracking Code: BO * EXAM: CT CHEST, ABDOMEN, AND PELVIS WITH CONTRAST TECHNIQUE: Multidetector CT imaging of the chest, abdomen and pelvis was performed following the standard protocol during bolus administration of intravenous contrast. RADIATION DOSE REDUCTION: This exam was performed according to the departmental dose-optimization program which includes automated exposure control, adjustment of the mA and/or kV according to patient size and/or use of iterative reconstruction technique. CONTRAST:  85mL OMNIPAQUE IOHEXOL 300 MG/ML  SOLN COMPARISON:  09/10/2022 FINDINGS: CT CHEST FINDINGS Cardiovascular: Right chest port catheter. Aortic atherosclerosis. Normal heart size. No pericardial effusion. Mediastinum/Nodes: No enlarged mediastinal, hilar, or axillary lymph nodes. Small hiatal hernia. Thyroid gland, trachea, and esophagus demonstrate no significant findings. Lungs/Pleura: Numerous small treated metastases throughout the lungs are unchanged, predominantly cystic and thin walled in character, for example in the anterior left upper  lobe measuring 0.7 cm (series 4, image 61). Occasional small more solid nodules are unchanged, for example in the anterior left apex measuring 0.3 cm (series 4, image 20) and in the peripheral right apex measuring 0.3 cm (series 4, image 31). No pleural effusion or pneumothorax. Musculoskeletal: No chest wall abnormality. No acute osseous findings. CT ABDOMEN PELVIS FINDINGS Hepatobiliary: No focal liver abnormality is seen. Status post cholecystectomy. No biliary dilatation. Pancreas: Unremarkable. No pancreatic ductal dilatation or surrounding inflammatory changes. Spleen: Normal in size without significant abnormality. Adrenals/Urinary Tract: Adrenal glands are unremarkable. Kidneys are normal, without renal calculi, solid lesion, or hydronephrosis. Bladder is unremarkable. Stomach/Bowel: Stomach is within normal limits. Appendix appears normal. No evidence of bowel wall thickening, distention, or inflammatory changes. Status post omentectomy. Vascular/Lymphatic: Severe aortic atherosclerosis. Several lymph nodes are diminished in size, for example bilateral  inguinal nodes, on the left measuring 0.9 x 0.6 cm, previously 1.3 x 1.1 cm (series 2, image 108). Other small treated lymph nodes throughout the abdomen and pelvis unchanged, many of which are faintly calcified. Reproductive: Status post hysterectomy and oophorectomy. Other: Low midline ventral hernia containing a nonobstructed loop of transverse colon (series 2, image 80). No ascites. Musculoskeletal: No acute osseous findings. IMPRESSION: 1. Numerous small treated metastases throughout the lungs are unchanged, predominantly cystic and thin walled in character. Occasional small more solid nodules are unchanged. 2. Several nodal metastases throughout the abdomen and pelvis are diminished in size, consistent with treatment response. Other small treated lymph nodes throughout the abdomen and pelvis unchanged. 3. No residual perceptible peritoneal metastatic  disease. 4. No new evidence of metastatic disease in the chest, abdomen, or pelvis. 5. Status post hysterectomy, oophorectomy, and omentectomy. 6. Low midline ventral hernia containing a nonobstructed loop of transverse colon. Aortic Atherosclerosis (ICD10-I70.0). Electronically Signed   By: Jearld Lesch M.D.   On: 01/16/2023 15:12      01/17/2023 Tumor Marker   Patient's tumor was tested for the following markers: CA-125. Results of the tumor marker test revealed 11.6.     PHYSICAL EXAMINATION: ECOG PERFORMANCE STATUS: 0 - Asymptomatic  Vitals:   03/14/23 0930  BP: (!) 155/68  Pulse: 91  Resp: 18  Temp: 97.7 F (36.5 C)  SpO2: 100%   Filed Weights   03/14/23 0930  Weight: 140 lb (63.5 kg)    GENERAL:alert, no distress and comfortable  LABORATORY DATA:  I have reviewed the data as listed    Component Value Date/Time   NA 140 03/14/2023 0902   NA 139 04/14/2017 0742   K 3.9 03/14/2023 0902   K 4.0 04/14/2017 0742   CL 105 03/14/2023 0902   CO2 28 03/14/2023 0902   CO2 21 (L) 04/14/2017 0742   GLUCOSE 123 (H) 03/14/2023 0902   GLUCOSE 247 (H) 04/14/2017 0742   BUN 16 03/14/2023 0902   BUN 13.1 04/14/2017 0742   CREATININE 1.36 (H) 03/14/2023 0902   CREATININE 0.83 08/29/2022 0754   CREATININE 0.9 04/14/2017 0742   CALCIUM 9.4 03/14/2023 0902   CALCIUM 9.2 04/14/2017 0742   PROT 6.2 (L) 03/14/2023 0902   PROT 7.3 04/14/2017 0742   ALBUMIN 3.8 03/14/2023 0902   ALBUMIN 3.9 04/14/2017 0742   AST 15 03/14/2023 0902   AST 13 (L) 08/29/2022 0754   AST 17 04/14/2017 0742   ALT 6 03/14/2023 0902   ALT 6 08/29/2022 0754   ALT 14 04/14/2017 0742   ALKPHOS 87 03/14/2023 0902   ALKPHOS 70 04/14/2017 0742   BILITOT 0.5 03/14/2023 0902   BILITOT 0.4 08/29/2022 0754   BILITOT 0.37 04/14/2017 0742   GFRNONAA 42 (L) 03/14/2023 0902   GFRNONAA >60 08/29/2022 0754   GFRAA >60 01/24/2020 0924   GFRAA >60 07/19/2019 0951    No results found for: "SPEP", "UPEP"  Lab  Results  Component Value Date   WBC 4.7 03/14/2023   NEUTROABS 3.0 03/14/2023   HGB 11.0 (L) 03/14/2023   HCT 32.1 (L) 03/14/2023   MCV 103.2 (H) 03/14/2023   PLT 181 03/14/2023      Chemistry      Component Value Date/Time   NA 140 03/14/2023 0902   NA 139 04/14/2017 0742   K 3.9 03/14/2023 0902   K 4.0 04/14/2017 0742   CL 105 03/14/2023 0902   CO2 28 03/14/2023 0902   CO2 21 (L)  04/14/2017 0742   BUN 16 03/14/2023 0902   BUN 13.1 04/14/2017 0742   CREATININE 1.36 (H) 03/14/2023 0902   CREATININE 0.83 08/29/2022 0754   CREATININE 0.9 04/14/2017 0742      Component Value Date/Time   CALCIUM 9.4 03/14/2023 0902   CALCIUM 9.2 04/14/2017 0742   ALKPHOS 87 03/14/2023 0902   ALKPHOS 70 04/14/2017 0742   AST 15 03/14/2023 0902   AST 13 (L) 08/29/2022 0754   AST 17 04/14/2017 0742   ALT 6 03/14/2023 0902   ALT 6 08/29/2022 0754   ALT 14 04/14/2017 0742   BILITOT 0.5 03/14/2023 0902   BILITOT 0.4 08/29/2022 0754   BILITOT 0.37 04/14/2017 0742

## 2023-03-16 LAB — CA 125: Cancer Antigen (CA) 125: 14.5 U/mL (ref 0.0–38.1)

## 2023-03-17 NOTE — Telephone Encounter (Signed)
Oral Oncology Patient Advocate Encounter   Submitted application for assistance for Zejula to [employer].   Application submitted via e-fax to [phone]   [employer] phone number [phone].   I will continue to check the status until final determination.   [name], CPhT-Adv Oncology Pharmacy Patient Advocate [facility] Direct Number: [phone]  Fax: [phone]

## 2023-03-29 ENCOUNTER — Encounter: Payer: Self-pay | Admitting: Hematology and Oncology

## 2023-04-04 ENCOUNTER — Telehealth: Payer: Self-pay

## 2023-04-04 NOTE — Telephone Encounter (Signed)
Called regarding mychart message and dental procedure. Per Dr. Bertis Ruddy there is a slight risk increased risk of infection if she does not stop hold Zejula for 3 days before and 3 days after extraction.  She verbalized understanding.

## 2023-04-15 ENCOUNTER — Encounter: Payer: Self-pay | Admitting: Hematology and Oncology

## 2023-04-15 ENCOUNTER — Other Ambulatory Visit (HOSPITAL_COMMUNITY): Payer: Self-pay

## 2023-04-15 ENCOUNTER — Telehealth: Payer: Self-pay | Admitting: Pharmacy Technician

## 2023-04-15 NOTE — Telephone Encounter (Signed)
Oral Oncology Patient Advocate Encounter  Was successful in securing patient a $3,500 grant from [employer] to provide copayment coverage for Zejula.  This will keep the out of pocket expense at $0.     [employer] ID: [id number]   The billing information is as follows and has been shared with [facility].    RxBin: [id number] PCN: [id number] Member ID: [id number] Group ID: [id number] Dates of Eligibility: 03/16/23 through 03/14/24  Fund:  Ovarian  [name], CPhT-Adv Oncology Pharmacy Patient Advocate [facility] Direct Number: [phone]  Fax: [phone]

## 2023-04-17 ENCOUNTER — Inpatient Hospital Stay: Payer: Medicare Other | Attending: Hematology and Oncology

## 2023-04-17 ENCOUNTER — Ambulatory Visit (HOSPITAL_COMMUNITY)
Admission: RE | Admit: 2023-04-17 | Discharge: 2023-04-17 | Disposition: A | Discharge status: Home / Self Care | Payer: Medicare Other | Source: Ambulatory Visit | Attending: Hematology and Oncology | Admitting: Hematology and Oncology

## 2023-04-17 DIAGNOSIS — Z7189 Other specified counseling: Secondary | ICD-10-CM

## 2023-04-17 DIAGNOSIS — C5701 Malignant neoplasm of right fallopian tube: Secondary | ICD-10-CM | POA: Insufficient documentation

## 2023-04-17 LAB — COMPREHENSIVE METABOLIC PANEL
ALT: 6 U/L (ref 0–44)
AST: 16 U/L (ref 15–41)
Albumin: 3.7 g/dL (ref 3.5–5.0)
Alkaline Phosphatase: 78 U/L (ref 38–126)
Anion gap: 4 — ABNORMAL LOW (ref 5–15)
BUN: 18 mg/dL (ref 8–23)
CO2: 27 mmol/L (ref 22–32)
Calcium: 9 mg/dL (ref 8.9–10.3)
Chloride: 107 mmol/L (ref 98–111)
Creatinine, Ser: 1.41 mg/dL — ABNORMAL HIGH (ref 0.44–1.00)
GFR, Estimated: 41 mL/min — ABNORMAL LOW (ref >60)
Glucose, Bld: 94 mg/dL (ref 70–99)
Potassium: 4.1 mmol/L (ref 3.5–5.1)
Sodium: 138 mmol/L (ref 135–145)
Total Bilirubin: 0.7 mg/dL (ref <1.2)
Total Protein: 6 g/dL — ABNORMAL LOW (ref 6.5–8.1)

## 2023-04-17 LAB — CBC WITH DIFFERENTIAL/PLATELET
Abs Immature Granulocytes: 0.01 10*3/uL (ref 0.00–0.07)
Basophils Absolute: 0 10*3/uL (ref 0.0–0.1)
Basophils Relative: 1 %
Eosinophils Absolute: 0.3 10*3/uL (ref 0.0–0.5)
Eosinophils Relative: 6 %
HCT: 31.5 % — ABNORMAL LOW (ref 36.0–46.0)
Hemoglobin: 10.8 g/dL — ABNORMAL LOW (ref 12.0–15.0)
Immature Granulocytes: 0 %
Lymphocytes Relative: 24 %
Lymphs Abs: 1.2 10*3/uL (ref 0.7–4.0)
MCH: 34.4 pg — ABNORMAL HIGH (ref 26.0–34.0)
MCHC: 34.3 g/dL (ref 30.0–36.0)
MCV: 100.3 fL — ABNORMAL HIGH (ref 80.0–100.0)
Monocytes Absolute: 0.5 10*3/uL (ref 0.1–1.0)
Monocytes Relative: 9 %
Neutro Abs: 3.1 10*3/uL (ref 1.7–7.7)
Neutrophils Relative %: 60 %
Platelets: 159 10*3/uL (ref 150–400)
RBC: 3.14 MIL/uL — ABNORMAL LOW (ref 3.87–5.11)
RDW: 11.9 % (ref 11.5–15.5)
WBC: 5.1 10*3/uL (ref 4.0–10.5)
nRBC: 0 % (ref 0.0–0.2)

## 2023-04-17 MED ORDER — IOHEXOL 300 MG/ML  SOLN
80.0000 mL | Freq: Once | INTRAMUSCULAR | Status: AC | PRN
  Administered 2023-04-17: 100 mL via INTRAVENOUS

## 2023-04-17 MED ORDER — HEPARIN SOD (PORK) LOCK FLUSH 100 UNIT/ML IV SOLN
INTRAVENOUS | Status: AC
  Filled 2023-04-17: qty 5

## 2023-04-17 MED ORDER — SODIUM CHLORIDE 0.9% FLUSH
10.0000 mL | Freq: Once | INTRAVENOUS | Status: AC
  Administered 2023-04-17: 10 mL

## 2023-04-17 MED ORDER — HEPARIN SOD (PORK) LOCK FLUSH 100 UNIT/ML IV SOLN
500.0000 [IU] | Freq: Once | INTRAVENOUS | Status: AC
  Administered 2023-04-17: 500 [IU] via INTRAVENOUS

## 2023-04-18 LAB — CA 125: Cancer Antigen (CA) 125: 15.8 U/mL (ref 0.0–38.1)

## 2023-04-22 ENCOUNTER — Other Ambulatory Visit: Payer: Self-pay | Admitting: Hematology and Oncology

## 2023-04-24 ENCOUNTER — Encounter: Payer: Self-pay | Admitting: Hematology and Oncology

## 2023-04-27 ENCOUNTER — Other Ambulatory Visit: Payer: Self-pay | Admitting: Hematology and Oncology

## 2023-04-28 ENCOUNTER — Encounter: Payer: Self-pay | Admitting: Hematology and Oncology

## 2023-04-29 ENCOUNTER — Inpatient Hospital Stay: Payer: Medicare Other | Attending: Hematology and Oncology | Admitting: Hematology and Oncology

## 2023-04-29 ENCOUNTER — Encounter: Payer: Self-pay | Admitting: Hematology and Oncology

## 2023-04-29 VITALS — BP 141/71 | HR 66 | Temp 98.5°F | Resp 18 | Ht 65.0 in | Wt 139.8 lb

## 2023-04-29 DIAGNOSIS — K5909 Other constipation: Secondary | ICD-10-CM

## 2023-04-29 DIAGNOSIS — C5701 Malignant neoplasm of right fallopian tube: Secondary | ICD-10-CM | POA: Insufficient documentation

## 2023-04-29 DIAGNOSIS — C7802 Secondary malignant neoplasm of left lung: Secondary | ICD-10-CM | POA: Insufficient documentation

## 2023-04-29 DIAGNOSIS — N183 Chronic kidney disease, stage 3 unspecified: Secondary | ICD-10-CM | POA: Diagnosis not present

## 2023-04-29 DIAGNOSIS — I10 Essential (primary) hypertension: Secondary | ICD-10-CM | POA: Diagnosis not present

## 2023-04-29 DIAGNOSIS — E8809 Other disorders of plasma-protein metabolism, not elsewhere classified: Secondary | ICD-10-CM | POA: Insufficient documentation

## 2023-04-29 DIAGNOSIS — C7801 Secondary malignant neoplasm of right lung: Secondary | ICD-10-CM | POA: Insufficient documentation

## 2023-04-29 DIAGNOSIS — K089 Disorder of teeth and supporting structures, unspecified: Secondary | ICD-10-CM | POA: Diagnosis not present

## 2023-04-29 NOTE — Assessment & Plan Note (Signed)
I recommend aggressive laxative therapy

## 2023-04-29 NOTE — Assessment & Plan Note (Signed)
Recommend high-protein diet 

## 2023-04-29 NOTE — Assessment & Plan Note (Signed)
 Overall, she tolerated niraparib  well CT imaging from December 2024 was reviewed which showed stable disease control So far, she tolerated treatment well without major side effects The plan will be to continue treatment Will repeat imaging study again in April

## 2023-04-29 NOTE — Assessment & Plan Note (Signed)
 She has very poor dentition requiring dental extraction I recommend the patient to hold niraparib for 5 days prior to dental extraction

## 2023-04-29 NOTE — Assessment & Plan Note (Signed)
 We discussed importance of risk factor modification and increase oral fluid intake

## 2023-04-29 NOTE — Progress Notes (Signed)
 [facility] OFFICE PROGRESS NOTE  Patient Care Team: Loreli Kins, MD as PCP - General (Family Medicine) Lonn Hicks, MD as Consulting Physician (Hematology and Oncology)  ASSESSMENT & PLAN:  Adenocarcinoma of right fallopian tube (HCC) Overall, she tolerated niraparib  well CT imaging from December 2024 was reviewed which showed stable disease control So far, she tolerated treatment well without major side effects The plan will be to continue treatment Will repeat imaging study again in April  CKD (chronic kidney disease), stage III (HCC) We discussed importance of risk factor modification and increase oral fluid intake  Essential hypertension Her blood pressure is stable She has intermittent elevated blood pressure but she is not symptomatic She will continue atenolol  in the morning  We discussed importance of lifestyle changes and risk factor modification for blood pressure control  Poor dentition She has very poor dentition requiring dental extraction I recommend the patient to hold niraparib  for 5 days prior to dental extraction  Proteins serum plasma low Recommend high-protein diet  Other constipation I recommend aggressive laxative therapy  No orders of the defined types were placed in this encounter.   All questions were answered. The patient knows to call the clinic with any problems, questions or concerns. The total time spent in the appointment was 40 minutes encounter with patients including review of chart and various tests results, discussions about plan of care and coordination of care plan   Hicks Lonn, MD 04/29/2023 9:57 AM  INTERVAL HISTORY: Please see below for problem oriented charting. she returns for surveillance follow-up and review of CT imaging results She is here accompanied by her husband She continues to have mild intermittent constipation I reviewed her documented blood pressure from home We reviewed blood test results and  discussed importance of high-protein diet and increase oral fluid intake due to high creatinine We reviewed imaging studies together We discussed the plan of care in terms of continuing on niraparib  The patient desired dental extraction for poor dentition and we discussed withholding chemotherapy prior to surgery  REVIEW OF SYSTEMS:   Constitutional: Denies fevers, chills or abnormal weight loss Eyes: Denies blurriness of vision Ears, nose, mouth, throat, and face: Denies mucositis or sore throat Respiratory: Denies cough, dyspnea or wheezes Cardiovascular: Denies palpitation, chest discomfort or lower extremity swelling Skin: Denies abnormal skin rashes Lymphatics: Denies new lymphadenopathy or easy bruising Neurological:Denies numbness, tingling or new weaknesses Behavioral/Psych: Mood is stable, no new changes  All other systems were reviewed with the patient and are negative.  I have reviewed the past medical history, past surgical history, social history and family history with the patient and they are unchanged from previous note.  ALLERGIES:  is allergic to dilaudid [hydromorphone hcl], morphine  and codeine , and sudafed [pseudoephedrine hcl].  MEDICATIONS:  Current Outpatient Medications  Medication Sig Dispense Refill   ALAWAY 0.035 % ophthalmic solution Place 1 drop into both eyes 2 (two) times daily as needed (for irritation).     atenolol  (TENORMIN ) 25 MG tablet TAKE 1 TABLET (25 MG TOTAL) BY MOUTH DAILY. 30 tablet 2   docusate sodium  (COLACE) 100 MG capsule Take 100 mg by mouth daily as needed for mild constipation.     lidocaine -prilocaine  (EMLA ) cream Apply to affected area once daily as directed. (Patient taking differently: 1 application  daily as needed (as directed- to access port).) 30 g 3   MIRALAX  17 GM/SCOOP powder Take 17 g by mouth daily as needed for mild constipation.     niraparib   tosylate (ZEJULA ) 100 MG tablet Take 1 tablet (100 mg total) by mouth daily. May  take at bedtime to reduce nausea and vomiting. 30 tablet 3   ondansetron  (ZOFRAN ) 8 MG tablet Take 1 tablet (8 mg total) by mouth every 8 (eight) hours as needed for nausea or vomiting. Start on the third day after chemotherapy. 30 tablet 1   prochlorperazine  (COMPAZINE ) 10 MG tablet Take 1 tablet (10 mg total) by mouth every 6 (six) hours as needed for nausea or vomiting. 30 tablet 1   RESTASIS 0.05 % ophthalmic emulsion Place 1 drop into both eyes 2 (two) times daily.     sodium chloride  (OCEAN) 0.65 % SOLN nasal spray Place 1 spray into both nostrils as needed for congestion.     triamcinolone  cream (KENALOG ) 0.1 % Apply 1 Application topically 2 (two) times daily.     TYLENOL  8 HOUR ARTHRITIS PAIN 650 MG CR tablet Take 650-1,300 mg by mouth every 8 (eight) hours as needed for pain.     venlafaxine  XR (EFFEXOR -XR) 37.5 MG 24 hr capsule TAKE 1 CAPSULE BY MOUTH DAILY WITH BREAKFAST. (Patient taking differently: Take 37.5 mg by mouth daily with breakfast.) 90 capsule 3   No current facility-administered medications for this visit.    SUMMARY OF ONCOLOGIC HISTORY: Oncology History Overview Note  Neg genetics High grade serous    Adenocarcinoma of right fallopian tube (HCC)  08/12/2016 Initial Diagnosis   The patient has many months of vague symptoms of fullness in the pelvis, urinary frequency and difficulty with defecation. She denies vaginal bleeding or rectal bleeding.     08/12/2016 Imaging   Abdomen X-ray in the ER: Moderate colonic stool burden without evidence of enteric obstruction   11/13/2016 Imaging   TVUS was performed on 11/13/16 which showed a large hypoechoic lobular mass seen posterior to the uterus measuring 18.2x16.1x11.8cm, blood flow was seen along the periphery of the mass. It was considered to be likely to be a fibroid vs pelvic mass vs ovarian mass. Neither ovary was removed. Moderate amount of free fluid in the pelvis. THe uterus measures 4x10x4cm. The endometrium is  4mm.    12/05/2016 Imaging   MR pelvis 1. Mild limitations as detailed above. 2. Heterogeneous pelvic mass is favored to arise from the right ovary. Favor a solid ovarian neoplasm such as fibroma/Brenner's tumor. Given lesion size and heterogeneous T2 signal out, complicating torsion cannot be excluded. 3. Moderate abdominopelvic ascites, without specific evidence of peritoneal metastasis. Consider further evaluation with contrast-enhanced abdominopelvic CT.    12/26/2016 Pathology Results   1. Uterus, ovaries and fallopian tubes - ADENOCARCINOMA INVOLVING RIGHT FALLOPIAN TUBE, RIGHT AND LEFT OVARIES, UTERINE SEROSA AND PELVIC MASS. - SEE ONCOLOGY TABLE AND COMMENT. - UTERINE CERVIX, ENDOMETRIUM, MYOMETRIUM AND LEFT FALLOPIAN TUBE FREE OF TUMOR. 2. Omentum, resection for tumor - ADENOCARCINOMA. Microscopic Comment 1. ONCOLOGY TABLE - FALLOPIAN TUBE. SEE COMMENT 1. Specimen, including laterality: Uterus, bilateral adnexa, pelvic mass and omentum. 2. Procedure: Hysterectomy with bilateral salpingo-oophorectomy, pelvic mass excision and omentectomy. 3. Lymph node sampling performed: No 4. Tumor site: See comment. 5. Tumor location in fallopian tube: Right fallopian tube fimbria 6. Specimen integrity (intact/ruptured/disrupted): Intact 7. Tumor size (cm): 18 cm, see comment. 8. Histologic type: Adenocarcinoma 9. Grade: High grade 10. Microscopic tumor extension: Tumor involves right fallopian tube, right and left ovaries, uterine serosa, pelvic mass and omentum. 11. Margins: See comment. 12. Lymph-Vascular invasion: Present 13. Lymph nodes: # examined: 0; # positive: N/A 14. TNM: pT3c,  pNX 15. FIGO Stage (based on pathologic findings, needs clinical correlation: III-C 16. Comments: There is an 18 cm pelvic mass which is a high grade adenocarcinoma and there is a 12.2 cm  segment of omentum which is extensively involved with adenocarcinoma. The tumor also involves the fimbria of the right  fallopian tube and the parenchyma of the right and left ovaries as well as uterine serosa. The fimbria of the right fallopian has intraepithelial atypia consistent with a precursor lesion and therefore this is most consistent with primary fallopian tube adenocarcinoma. A primary peritoneal serous carcinoma is a less likely possibility.    12/26/2016 Pathology Results   PERITONEAL/ASCITIC FLUID(SPECIMEN 1 OF 1 COLLECTED 12/26/16): POORLY DIFFERENTIATED ADENOCARCINOMA   12/26/2016 Surgery   Procedure(s) Performed: Exploratory laparotomy with total abdominal hysterectomy, bilateral salpingo-oophorectomy, omentectomy radical tumor debulking for ovarian cancer .   Surgeon: Maurilio JAYSON Ship, MD.      Operative Findings: 20cm left ovarian mass densely adherent to the posterior uterus, right tube and ovary, cervix, sigmoid colon. 10cm omental cake. 4L ascites. 1mm size tumor nodules on the serosa of the terminal ileum and proximal sigmoid colon. 1mm nodules on right diaphragm.    This represented an optimal cytoreduction (R1) with 1mm nodules on intestine and diaphragm representing gross visible disease   01/16/2017 Procedure   Placement of single lumen port a cath via right internal jugular vein. The catheter tip lies at the cavo-atrial junction. A power injectable port a cath was placed and is ready for immediate use.   01/17/2017 Imaging   1. 3.5 x 7.2 x 4.6 cm fluid collection along the vaginal cuff, posterior the bladder and extending into the right adnexal space. This lesion demonstrates rim enhancement. Imaging features could be related to a loculated postoperative seroma or hematoma. Superinfection cannot be excluded by CT. Given debulking surgery was 3 weeks ago, this entire structure is un likely to represent neoplasm, but peritoneal involvement could have this appearance. 2. Small fluid collection in the left para colic gutter without rim enhancement. 3. Irregular/nodular appearance of the peritoneal M in  the anatomic pelvis with areas of subtle nodularity between the stomach and the spleen. Close attention in these regions on follow-up recommended as metastatic disease is a concern. 4. Mildly enlarged hepatoduodenal ligament lymph node. Attention on follow-up recommended.   01/20/2017 Tumor Marker   Patient's tumor was tested for the following markers: CA-125 Results of the tumor marker test revealed 46.6   01/28/2017 Genetic Testing       Negative genetic testing on the [facility] panel.  The [facility] gene panel offered by [employer] includes sequencing and deletion/duplication testing of the following 28 genes: APC, ATM, BARD1, BMPR1A, BRCA1, BRCA2, BRIP1, CHD1, CDK4, CDKN2A, CHEK2, EPCAM (large rearrangement only), MLH1, MSH2, MSH6, MUTYH, NBN, PALB2, PMS2, PTEN, RAD51C, RAD51D, SMAD4, STK11, and TP53. Sequencing was performed for select regions of POLE and POLD1, and large rearrangement analysis was performed for select regions of GREM1. The report date is January 28, 2017.  HRD testing looking for genomic instability and BRCA mutations was negative.  The report date of this test is January 27, 2017.    01/31/2017 Tumor Marker   Patient's tumor was tested for the following markers: CA-125 Results of the tumor marker test revealed 22.4   03/04/2017 Tumor Marker   Patient's tumor was tested for the following markers: CA-125 Results of the tumor marker test revealed 13.8   04/18/2017 Tumor Marker   Patient's tumor was  tested for the following markers: CA-125 Results of the tumor marker test revealed 11.9   05/05/2017 Tumor Marker   Patient's tumor was tested for the following markers: CA-125 Results of the tumor marker test revealed 12   05/26/2017 Tumor Marker   Patient's tumor was tested for the following markers: CA-125 Results of the tumor marker test revealed 10.6   05/26/2017 Imaging   1. A previously noted postoperative fluid collection in the low pelvis and  residual ascites seen on the prior examination has resolved on today's study. No findings to suggest residual/recurrent disease on today's examination. No definite solid organ metastasis identified in the abdomen or pelvis. No lymphadenopathy. 2. Aortic atherosclerosis.   06/10/2017 Procedure   Successful right IJ vein Port-A-Cath explant.   03/09/2018 Tumor Marker   Patient's tumor was tested for the following markers: CA-125 Results of the tumor marker test revealed 12.1   05/26/2018 Tumor Marker   Patient's tumor was tested for the following markers: CA-125 Results of the tumor marker test revealed 13.8   09/09/2018 Tumor Marker   Patient's tumor was tested for the following markers: CA-125 Results of the tumor marker test revealed 23.9   12/18/2018 Tumor Marker   Patient's tumor was tested for the following markers: CA-125 Results of the tumor marker test revealed 43.8   02/27/2019 - 02/28/2019 Hospital Admission   She was admitted for bowel obstruction   02/27/2019 Imaging   1. High-grade small bowel obstruction with transition point in the pelvis in an area of suspected desmoplastic reaction surrounding a 1.3 cm spiculated nodule in the mesentery, suspicious for carcinoid. There is hyperenhancement near the tip of the appendix and loss of the normal fat plane between it and the adjacent sigmoid colon, potentially the site of primary lesion. 2. Multiple new small subcentimeter hyperdense lesions scattered along the liver capsule, concerning for metastases. Enlarged hyperenhancing gastrohepatic and portacaval lymph nodes concerning for nodal metastases. 3. Very mild right hydroureteronephrosis to the level of the desmoplastic reaction in the pelvis, potentially involving the right ureter. 4. Infraumbilical ventral abdominal wall diastasis containing a small nondilated portion of transverse colon. 5. Trace ascites. 6. Cholelithiasis. 7.  Aortic atherosclerosis (ICD10-I70.0).      03/05/2019 Tumor Marker   Patient's tumor was tested for the following markers: CA-125 Results of the tumor marker test revealed 70.1   03/12/2019 Echocardiogram   IMPRESSIONS     1. Left ventricular ejection fraction, by visual estimation, is 60 to 65%. The left ventricle has normal function. There is no left ventricular hypertrophy.  2. Left ventricular diastolic parameters are consistent with Grade I diastolic dysfunction (impaired relaxation).  3. Global right ventricle has normal systolic function.The right ventricular size is normal. No increase in right ventricular wall thickness.  4. Left atrial size was normal.  5. Right atrial size was normal.  6. The pericardium was not assessed.  7. The mitral valve is normal in structure. No evidence of mitral valve regurgitation.  8. The tricuspid valve is normal in structure. Tricuspid valve regurgitation is trivial.  9. The aortic valve is normal in structure. Aortic valve regurgitation is not visualized. 10. The pulmonic valve was grossly normal. Pulmonic valve regurgitation is not visualized. 11. The average left ventricular global longitudinal strain is -17.6 %.   03/16/2019 Procedure   Successful placement of a right internal jugular approach power injectable Port-A-Cath. The catheter is ready for immediate use.     04/19/2019 Tumor Marker   Patient's tumor was tested  for the following markers: CA-125 Results of the tumor marker test revealed 39.8   05/17/2019 Tumor Marker   Patient's tumor was tested for the following markers: CA-125 Results of the tumor marker test revealed 27   05/28/2019 Tumor Marker   Patient's tumor was tested for the following markers: CA-125 Results of the tumor marker test revealed 27.7.   06/11/2019 Imaging   1. No new or progressive metastatic disease in the abdomen or pelvis. 2. Tiny noncalcified perisplenic implant is decreased. Calcified pericardiophrenic, retroperitoneal and bilateral inguinal  lymph nodes and scattered small calcified perihepatic and perisplenic implants are stable. 3.  Aortic Atherosclerosis (ICD10-I70.0).       06/17/2019 Echocardiogram    1. Left ventricular ejection fraction, by estimation, is 65 to 70%. The left ventricle has normal function. The left ventricle has no regional wall motion abnormalities. Left ventricular diastolic parameters were normal.  2. Right ventricular systolic function is normal. The right ventricular size is normal.  3. The mitral valve is normal in structure and function. No evidence of mitral valve regurgitation. No evidence of mitral stenosis.  4. The aortic valve is normal in structure and function. Aortic valve regurgitation is not visualized. No aortic stenosis is present.   06/21/2019 Tumor Marker   Patient's tumor was tested for the following markers: CA-125 Results of the tumor marker test revealed 18.7   07/19/2019 Tumor Marker   Patient's tumor was tested for the following markers: CA-125 Results of the tumor marker test revealed 16.7   08/30/2019 Tumor Marker   Patient's tumor was tested for the following markers: CA-125. Results of the tumor marker test revealed 13.9   09/15/2019 Echocardiogram    1. Left ventricular ejection fraction, by estimation, is 65 to 70%. The left ventricle has normal function. The left ventricle has no regional wall motion abnormalities. Left ventricular diastolic parameters are consistent with Grade I diastolic dysfunction (impaired relaxation). The average left ventricular global longitudinal strain is 18.1 %. The global longitudinal strain is normal.  2. Right ventricular systolic function is normal. The right ventricular size is normal.  3. The mitral valve is normal in structure. No evidence of mitral valve regurgitation. No evidence of mitral stenosis.  4. The aortic valve is normal in structure. Aortic valve regurgitation is not visualized. No aortic stenosis is present.  5. The inferior  vena cava is normal in size with greater than 50% respiratory variability, suggesting right atrial pressure of 3 mmHg.     09/24/2019 Imaging   1. Stable exam. No new or progressive metastatic disease on today's study. 2. Small subcapsular hypodensity in the lateral spleen, new in the interval, but indeterminate. Attention on follow-up recommended. 3. The calcified upper abdominal and groin lymph nodes are stable as are the scattered calcified and noncalcified peritoneal implants. 4. Stable midline ventral hernia containing a short segment of colon without complicating features. 5. Aortic Atherosclerosis (ICD10-I70.0).     10/04/2019 - 09/21/2021 Chemotherapy   Patient is on Treatment Plan : ovarian Bevacizumab  q21d     10/04/2019 Tumor Marker   Patient's tumor was tested for the following markers: CA-125. Results of the tumor marker test revealed 12.9.   11/01/2019 Tumor Marker   Patient's tumor was tested for the following markers: CA-125 Results of the tumor marker test revealed 15.4   12/13/2019 Tumor Marker   Patient's tumor was tested for the following markers: CA-125 Results of the tumor marker test revealed 14.8   12/31/2019 Imaging   1. Unchanged  tiny peritoneal nodule adjacent to the spleen measuring 6 mm. Other previously noted peritoneal nodules are imperceptible on present examination. 2. Unchanged prominent, partially calcified portacaval lymph node and bilateral inguinal lymph nodes. 3. Unchanged subtle, nonspecific subcapsular lesion of the spleen.  4. No evidence of new metastatic disease in the abdomen or pelvis. 5. Status post hysterectomy. 6. Unchanged low midline ventral abdominal hernia containing a single loop of nonobstructed transverse colon. 7. Cholelithiasis. 8. Aortic Atherosclerosis (ICD10-I70.0).       01/03/2020 Tumor Marker   Patient's tumor was tested for the following markers: CA-125 Results of the tumor marker test revealed 11.7.   01/24/2020 Tumor  Marker   Patient's tumor was tested for the following markers: CA-125. Results of the tumor marker test revealed 15.1   03/06/2020 Tumor Marker   Patient's tumor was tested for the following markers: CA-125. Results of the tumor marker test revealed 20.3   03/27/2020 Tumor Marker   Patient's tumor was tested for the following markers: CA-125 Results of the tumor marker test revealed 23.7   05/11/2020 Tumor Marker   Patient's tumor was tested for the following markers: CA-125. Results of the tumor marker test revealed 25.4   06/23/2020 Imaging   1. Cholelithiasis with gallbladder wall thickening and mild infiltrative edema in the porta hepatis, raising the possibility of acute cholecystitis. There is truncation of the common bile duct distally and distal choledocholithiasis is difficult to exclude. Correlate with bilirubin levels. If clinically warranted, MRCP could be utilized for further characterization. 2. Stable tiny perisplenic nodule. 3. Other imaging findings of potential clinical significance: Small type 1 hiatal hernia. Prominent stool throughout the colon favors constipation. Ventral infraumbilical hernia contains transverse colon without findings of strangulation or obstruction. Small supraumbilical hernia contains adipose tissue. Lumbar degenerative disc disease at L5-S1. Stable tiny nodule along the lateral margin of the spleen, nonspecific. 4. Aortic atherosclerosis.     12/15/2020 Imaging   No evidence of recurrent or metastatic carcinoma within the abdomen or pelvis.   Stable ventral hernia containing transverse colon. No evidence of bowel obstruction or strangulation.   Cholelithiasis. No radiographic evidence of cholecystitis.   Tiny hiatal hernia.   Aortic Atherosclerosis (ICD10-I70.0).     06/11/2021 Imaging   1. Numerous sub 4 mm pulmonary nodules in the lungs some of which have a cavitary appearance. I think it is unlikely this is metastatic disease and more likely  a possible immunotherapy related process such as a sarcoid like reaction. Recommend full chest CT further evaluation. 2. Stable small calcified retroperitoneal and mesenteric lymph nodes. No findings suspicious for recurrent peritoneal carcinomatosis. 3. Stable lower abdominal wall hernia containing part of the transverse colon. 4. Cholelithiasis.   09/27/2021 Imaging   1. Numerous small pulmonary nodules throughout the lungs, the majority of which are cavitary. These are all subcentimeter, however nodules that were seen in the bilateral lung bases on prior CT dated 06/08/2021 appear slightly increased in size and number. Behavior is highly suspicious for pulmonary metastatic disease despite the unusual appearance, however as previously reported general differential considerations include atypical infection and inflammation, such as drug-induced sarcoidosis like reaction.  2. Unchanged calcified lymph nodes in the abdomen and pelvis, consistent with treated nodal metastases. 3. Cholelithiasis with wall thickening and pericholecystic fluid. This appearance is similar to prior examination and suggests chronic cholecystitis. Correlate for clinical symptoms. Mild intrahepatic biliary ductal dilatation, similar to prior examination. 4. Midline ventral hernia containing a single nonobstructed loop of mid transverse colon.  12/25/2021 Imaging   1. Again noted are multiple thin walled cavitary lung nodules throughout both lungs. These are similar in size and multiplicity when compared with the exam from 09/26/2021. Etiology remains indeterminate, although metastatic disease cannot be excluded with a high degree of certainty. 2. Stable calcified upper abdominal lymph nodes consistent with treated nodal metastases. 3. Gallstones. 4. Aortic Atherosclerosis (ICD10-I70.0).   01/30/2022 Tumor Marker   Patient's tumor was tested for the following markers: CA-125. Results of the tumor marker test revealed  59.3.   01/31/2022 Imaging   1. No substantial interval change in exam. 2. Innumerable tiny cavitary pulmonary nodules in the lung bases. As noted previously, metastatic disease would be distinct consideration although imaging features are nonspecific and infectious/inflammatory etiology is not excluded. 3. Calcified nodal disease in the upper abdomen and pelvis shows no substantial interval change. Scattered noncalcified nodules in the mesentery are similar to prior. 4. Markedly large stool volume throughout the colon. Imaging features suggestive of clinical constipation. 5. Cholelithiasis with ill-defined appearance of the gallbladder wall. Appearance is stable in the interval. 6. Trace free fluid in the cul-de-sac. 7. Midline ventral hernia contains a short segment of transverse colon without complicating features. 8. Aortic Atherosclerosis (ICD10-I70.0).   03/07/2022 Surgery   Procedure Date:  03/07/2022  Pre-operative Diagnosis:  Symptomatic cholelithiasis   Post-operative Diagnosis: Symptomatic cholelithiasis, peritoneal implants with history of right fallopian tube cancer.   Procedure:   Robotic assisted cholecystectomy with ICG FireFly cholangiogram Robotic assisted abdominal wall peritoneal implant excisional biopsy x 3.   Surgeon:  Aloysius Sheree Plant, MD  Specimens:   Gallbladder Peritoneal implants x 3    Indications for Procedure:  This is a 68 y.o. female who presents with abdominal pain and workup revealing symptomatic cholelithiasis.  The benefits, complications, treatment options, and expected outcomes were discussed with the patient. The risks of bleeding, infection, recurrence of symptoms, failure to resolve symptoms, bile duct damage, bile duct leak, retained common bile duct stone, bowel injury, and need for further procedures were all discussed with the patient and she was willing to proceed.     03/07/2022 Pathology Results   SURGICAL PATHOLOGY  CASE:  [id number]  PATIENT: MADELINE SAVER  Surgical Pathology Report   Specimen Submitted:  A. Gallbladder  B. Abdominal wall   Clinical History: Symptomatic cholelithiasis   DIAGNOSIS:  A. GALLBLADDER; CHOLECYSTECTOMY:  - CHRONIC CHOLECYSTITIS WITH CHOLELITHIASIS.  - NEGATIVE FOR DYSPLASIA AND MALIGNANCY.  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   B. SOFT TISSUE, ABDOMINAL WALL; BIOPSY:  - POSITIVE FOR MALIGNANCY.  - METASTATIC CARCINOMA, COMPATIBLE WITH HIGH-GRADE SEROUS CARCINOMA OF GYNECOLOGIC PRIMARY.   Comment:  Immunohistochemical studies were performed on neoplastic cells in both sources. Tumor cells in both are positive for CK7, Pax-8, and WT-1, supporting the above diagnosis. P53 demonstrates strong and diffuse expression, indicative of mutated expression pattern.   There is sufficient tissue present for ancillary molecular testing if desired.    04/04/2022 Imaging   1. Mild progression of partially calcified peritoneal metastatic implants surrounding the liver, in the porta hepatis and in the periumbilical region. No generalized ascites or peritoneal nodularity status post omentectomy. 2. Grossly stable cavitary lesions at both lung bases, presumably reflecting metastatic disease. 3. No other evidence of progressive metastatic disease within the abdomen or pelvis. 4. Enlarging ventral hernia containing a portion of the transverse colon. No evidence of bowel obstruction or inflammation. 5.  Aortic Atherosclerosis (  ICD10-I70.0).   04/08/2022 - 08/31/2022 Chemotherapy   Patient is on Treatment Plan : OVARIAN Carboplatin  + Paclitaxel  + Bevacizumab  q21d      04/09/2022 Tumor Marker   Patient's tumor was tested for the following markers: CA-125. Results of the tumor marker test revealed 86.7.   05/22/2022 Tumor Marker   Patient's tumor was tested for the following markers: CA-125. Results of the  tumor marker test revealed 49.7.   06/05/2022 Imaging   1. New tiny bilateral pleural effusions. 2. Once again there are several small cavitary lesions in the lungs. Many of these are becoming more cavitary with less soft tissue elements and are smaller today. 3. Soft tissue thickening and nodularity along the surface of the liver appears to be overall decreasing from previous examination. Some of these areas have calcification. 4. Mesenteric nodules as well as retroperitoneal and mesenteric nodes appear similar to slightly improved. The inguinal nodes seen previously are similar. 5. No developing new mass lesion, ascites or lymph node enlargement seen. 6. Of interest the density of the lumen of the renal pelvis bilaterally is increased on the left compared to the right in the portal venous phase. This is of uncertain etiology and significance. Please correlate with the urinalysis such as for hematuria.   09/02/2022 Tumor Marker   Patient's tumor was tested for the following markers: CA-125. Results of the tumor marker test revealed 38.3.   09/12/2022 Imaging   CT CHEST ABDOMEN PELVIS W CONTRAST  Result Date: 09/12/2022 CLINICAL DATA:  Assess treatment response ovarian cancer * Tracking Code: BO * EXAM: CT CHEST, ABDOMEN, AND PELVIS WITH CONTRAST TECHNIQUE: Multidetector CT imaging of the chest, abdomen and pelvis was performed following the standard protocol during bolus administration of intravenous contrast. RADIATION DOSE REDUCTION: This exam was performed according to the departmental dose-optimization program which includes automated exposure control, adjustment of the mA and/or kV according to patient size and/or use of iterative reconstruction technique. CONTRAST:  OMNIPAQUE  IOHEXOL  300 MG/ML  SOLN COMPARISON:  Abdomen pelvis CT 06/05/2022 and older. Chest CT 12/25/2021 and older. FINDINGS: CT CHEST FINDINGS Cardiovascular: Right upper chest port. Heart is nonenlarged. No pericardial  effusion. Thoracic aorta has a normal course and caliber with mild atherosclerotic plaque. Mediastinum/Nodes: Stable thyroid gland. Slightly patulous thoracic esophagus with small hiatal hernia. No specific abnormal lymph node enlargement identified including axillary regions, hilum or mediastinum. No thoracic inlet abnormal nodes. One of the lymph nodes in the left axilla has some calcification, unchanged from previous. Lungs/Pleura: Lungs are without consolidation, pneumothorax or effusion. Few areas of peripheral interstitial septal thickening, scarring and fibrotic change. Previously there were numerous cavitary nodules in both lungs. The extent and distribution appears overall decreasing. The residual cavitary lesions have thinner walls than previous from September 2023. Specific lesions will be followed for continuity and compared in measurement to that exam. The nodules that are seen on the more recent study of the abdomen and pelvis from February 2024 also show improvement and wall thickness. Right upper lobe focus laterally which previously measured 6 mm in diameter, today is measured larger at 7 mm but again has a much thinner wall. Left upper lobe focus which previously has a diameter of 6 mm, today on series 4, image 53 has a diameter of 7 mm. Again much thinner wall. Less nodular. Focus right lower lobe laterally and posteriorly which previously measured 6 mm, today on series 4, image 116 measures 5 mm. Wall is thinner. And lastly nodule middle lobe posteriorly  which measured 3 mm on the prior, today on series 4, image 76 is smaller at proximally 1-2 mm. Musculoskeletal: Mild curvature of the spine. Scattered degenerative changes. CT ABDOMEN PELVIS FINDINGS Hepatobiliary: Diffuse fatty liver infiltration. No space-occupying liver lesion. Patent portal vein. Previous cholecystectomy. Once again there is some thickening and calcification along falciform ligament which is similar on today's examination,  series 2, image 52 for example. The nodule anterior to the left hepatic lobe towards the dome and margin of the diaphragm which previously measured 13 x 7 mm, today has some residual tissue measuring 10 x 4 mm. Additional focus posterior to segment 7 in the right hepatic lobe and laterally of also improved. Less thickening of the rind of soft tissue along the surface of the liver laterally. Pancreas: Global pancreatic atrophy without mass. Spleen: Preserved enhancement. Spleen previously was mildly enlarged at 13.1 cm cephalocaudal length and today measured at 12.7 cm. Adrenals/Urinary Tract: Adrenal glands are preserved. No enhancing renal mass or collecting system dilatation. Extrarenal pelvis seen to both kidneys. Stable Bosniak 1 exophytic left-sided renal cystic lesion. No specific imaging follow-up. The ureters have normal course and caliber down to the bladder. Preserved contours of the urinary bladder. Stomach/Bowel: Large amount of diffuse colonic stool. Focal caliber change in the course of the colon along the proximal sigmoid colon, nonspecific. Stomach and small bowel are nondilated. Small hiatal hernia. Once again there is rectus muscle diastasis with protuberance of bowel loops and fat in this location at the level of the upper pelvis. Again no signs of obstruction. Vascular/Lymphatic: Scattered vascular calcifications. Normal caliber aorta and IVC. Of interest best seen on coronal imaging there is a slightly tortuous course of the portal vein with what may be a thin stricture or web as seen on sagittal series 5, image 68. In retrospect this was present and has a uncertain etiology and significance. Several prominent lymph nodes are again identified. Specific lesions will be followed. Upper retroperitoneal nodes of the celiac axis are again seen. Example which previously measured 12 x 12 mm with some calcification, today on series 2, image 56 measures 9 by 10 mm. Lesion just caudal to this which  previously measured 19 x 10 mm, today measures 15 by 10 mm on series 2, image 57. Right-sided central mesenteric lymph node which previously measured 14 x 9 mm, today measures 10 x 5 mm on series 2, image 71. Central pelvic mesenteric node which previously measured 13 x 9 mm, today on series 2 image 94 measures 12 x 7 mm. This has a fatty hilum. Bilateral inguinal nodes with calcifications are seen. Left-sided focus previously measured 16 x 14 mm and today 13 x 11 mm. Right-sided inguinal focus which previously measured 17 x 14 mm, today series 2, image 115 measures 15 by 12 mm. Reproductive: Status post hysterectomy. No adnexal masses. Other: No free air or free fluid. The mesenteric cystic focus along the left side on the prior examination is not well seen today. Musculoskeletal: Curvature of the spine. Scattered degenerative changes of the spine and pelvis. IMPRESSION: Overall interval improvement. Decreasing surface nodularity and thickening along the liver. Decreasing lymph nodes overall. No new ascites or lymph node enlargement. Previous pleural effusions no longer identified from the study of February 2024. Several cavitary lesions identified throughout the lungs continue to show decreasing wall thickening and nodularity. Some of the lesions measured larger than the study of September 2023 but this could be evolution of treatment with decreasing soft tissue components  Fatty liver infiltration.  Borderline spleen size. Large amount of colonic stool Electronically Signed   By: Ranell Bring M.D.   On: 09/12/2022 09:56      09/25/2022 -  Chemotherapy    She started taking zejula    10/04/2022 Tumor Marker   Patient's tumor was tested for the following markers: CA-125. Results of the tumor marker test revealed 23.9.   12/03/2022 Tumor Marker   Patient's tumor was tested for the following markers: CA-125. Results of the tumor marker test revealed 15.5.   01/14/2023 Imaging   CT CHEST ABDOMEN PELVIS W  CONTRAST  Result Date: 01/16/2023 CLINICAL DATA:  Ovarian cancer, assess treatment response adenocarcinoma of right fallopian tube, chemotherapy complete * Tracking Code: BO * EXAM: CT CHEST, ABDOMEN, AND PELVIS WITH CONTRAST TECHNIQUE: Multidetector CT imaging of the chest, abdomen and pelvis was performed following the standard protocol during bolus administration of intravenous contrast. RADIATION DOSE REDUCTION: This exam was performed according to the departmental dose-optimization program which includes automated exposure control, adjustment of the mA and/or kV according to patient size and/or use of iterative reconstruction technique. CONTRAST:  85mL OMNIPAQUE  IOHEXOL  300 MG/ML  SOLN COMPARISON:  09/10/2022 FINDINGS: CT CHEST FINDINGS Cardiovascular: Right chest port catheter. Aortic atherosclerosis. Normal heart size. No pericardial effusion. Mediastinum/Nodes: No enlarged mediastinal, hilar, or axillary lymph nodes. Small hiatal hernia. Thyroid gland, trachea, and esophagus demonstrate no significant findings. Lungs/Pleura: Numerous small treated metastases throughout the lungs are unchanged, predominantly cystic and thin walled in character, for example in the anterior left upper lobe measuring 0.7 cm (series 4, image 61). Occasional small more solid nodules are unchanged, for example in the anterior left apex measuring 0.3 cm (series 4, image 20) and in the peripheral right apex measuring 0.3 cm (series 4, image 31). No pleural effusion or pneumothorax. Musculoskeletal: No chest wall abnormality. No acute osseous findings. CT ABDOMEN PELVIS FINDINGS Hepatobiliary: No focal liver abnormality is seen. Status post cholecystectomy. No biliary dilatation. Pancreas: Unremarkable. No pancreatic ductal dilatation or surrounding inflammatory changes. Spleen: Normal in size without significant abnormality. Adrenals/Urinary Tract: Adrenal glands are unremarkable. Kidneys are normal, without renal calculi, solid  lesion, or hydronephrosis. Bladder is unremarkable. Stomach/Bowel: Stomach is within normal limits. Appendix appears normal. No evidence of bowel wall thickening, distention, or inflammatory changes. Status post omentectomy. Vascular/Lymphatic: Severe aortic atherosclerosis. Several lymph nodes are diminished in size, for example bilateral inguinal nodes, on the left measuring 0.9 x 0.6 cm, previously 1.3 x 1.1 cm (series 2, image 108). Other small treated lymph nodes throughout the abdomen and pelvis unchanged, many of which are faintly calcified. Reproductive: Status post hysterectomy and oophorectomy. Other: Low midline ventral hernia containing a nonobstructed loop of transverse colon (series 2, image 80). No ascites. Musculoskeletal: No acute osseous findings. IMPRESSION: 1. Numerous small treated metastases throughout the lungs are unchanged, predominantly cystic and thin walled in character. Occasional small more solid nodules are unchanged. 2. Several nodal metastases throughout the abdomen and pelvis are diminished in size, consistent with treatment response. Other small treated lymph nodes throughout the abdomen and pelvis unchanged. 3. No residual perceptible peritoneal metastatic disease. 4. No new evidence of metastatic disease in the chest, abdomen, or pelvis. 5. Status post hysterectomy, oophorectomy, and omentectomy. 6. Low midline ventral hernia containing a nonobstructed loop of transverse colon. Aortic Atherosclerosis (ICD10-I70.0). Electronically Signed   By: Marolyn JONETTA Jaksch M.D.   On: 01/16/2023 15:12      01/17/2023 Tumor Marker   Patient's tumor was tested for  the following markers: CA-125. Results of the tumor marker test revealed 11.6.   03/17/2023 Tumor Marker   Patient's tumor was tested for the following markers: CA-125. Results of the tumor marker test revealed 14.5.   04/17/2023 Tumor Marker   Patient's tumor was tested for the following markers: CA-125. Results of the tumor  marker test revealed 15.8.   04/18/2023 Imaging   CT CHEST ABDOMEN PELVIS W CONTRAST Result Date: 04/25/2023 CLINICAL DATA:  Ovarian cancer, adenocarcinoma of right fallopian tube. Evaluate treatment response. * Tracking Code: BO * EXAM: CT CHEST, ABDOMEN, AND PELVIS WITH CONTRAST TECHNIQUE: Multidetector CT imaging of the chest, abdomen and pelvis was performed following the standard protocol during bolus administration of intravenous contrast. RADIATION DOSE REDUCTION: This exam was performed according to the departmental dose-optimization program which includes automated exposure control, adjustment of the mA and/or kV according to patient size and/or use of iterative reconstruction technique. CONTRAST:  OMNIPAQUE  IOHEXOL  300 MG/ML  SOLN COMPARISON:  01/14/2023 FINDINGS: CT CHEST FINDINGS Cardiovascular: Right Port-A-Cath tip high right atrium. Aortic atherosclerosis. Mild cardiomegaly, without pericardial effusion. Aortic valve calcification. No central pulmonary embolism, on this non-dedicated study. Mediastinum/Nodes: No supraclavicular adenopathy. No mediastinal or hilar adenopathy. Small hiatal hernia. Suspect mild distal esophageal wall thickening including on 42/2. Lungs/Pleura: No pleural fluid. Thin-walled cystic lesions are similar to on the prior exam and per prior report are sites of treated metastasis. There also tiny solid nodules which are unchanged. Example left apex 2-3 mm on 27/4. Lateral right upper lobe at 3 mm on 31/4. Subpleural right lower lobe at 3 mm on 86/4. Inferior right middle lobe 3 mm 116/4. Musculoskeletal: Included within the abdomen pelvic section. CT ABDOMEN PELVIS FINDINGS Hepatobiliary: Atypical hepatic morphology with caudate and lateral segment left liver lobe enlargement. No focal liver lesion. Cholecystectomy without biliary duct dilatation. Pancreas: Normal, without mass or ductal dilatation. Spleen: Normal in size, without focal abnormality. Adrenals/Urinary  Tract: Normal adrenal glands. Normal kidneys, without hydronephrosis. Normal urinary bladder. Stomach/Bowel: Surgical changes at the proximal stomach, likely related to prior hiatal hernia repair. Colonic stool burden suggests constipation. Ventral abdominal wall hernia contains nonobstructive transverse colon, similar. Normal terminal ileum. Normal small bowel. Vascular/Lymphatic: Advanced aortic and branch vessel atherosclerosis. Abdominal retroperitoneal node adjacent the IMA origin measures 1.2 x 1.1 cm on 71/2 versus 1.2 x 1.0 cm on the prior exam. Slightly more well-defined solid today. The partially calcified right inguinal node measures 1.0 cm on 110/2 versus 9 mm on the prior (when remeasured). A pelvic small bowel mesenteric node measures 9 mm on 93/2 versus 8 mm on the prior (when remeasured). Reproductive: Hysterectomy.  No adnexal mass. Other: No significant free fluid. Mild pelvic floor laxity. No evidence of omental or peritoneal disease. Musculoskeletal: Likely iatrogenic right abdominal wall subcutaneous nodule of 11 mm, more well-defined today. Osteopenia.  Degenerative disc disease at the lumbosacral junction. IMPRESSION: CT CHEST IMPRESSION 1. Similar treated pulmonary metastasis. No new or progressive disease. 2. No thoracic adenopathy. 3. Small hiatal hernia. Distal esophageal wall thickening is suspected. Correlate with symptoms of esophagitis. 4. Aortic valvular calcifications. Consider echocardiography to evaluate for valvular dysfunction. 5.  Aortic Atherosclerosis (ICD10-I70.0). CT ABDOMEN AND PELVIS IMPRESSION 1. Suspect minimal enlargement of abdominopelvic nodes. Cannot exclude mild disease progression. 2. No new sites of disease. 3. Atypical hepatic morphology for which cirrhosis cannot be excluded. 4.  Possible constipation. 5. Ventral pelvic wall hernia containing nonobstructive transverse colon, similar. Electronically Signed   By: Rockey Marthann HERO.D.  On: 04/25/2023 09:06         PHYSICAL EXAMINATION: ECOG PERFORMANCE STATUS: 1 - Symptomatic but completely ambulatory  Vitals:   04/29/23 0921  BP: (!) 141/71  Pulse: 66  Resp: 18  Temp: 98.5 F (36.9 C)  SpO2: 95%   Filed Weights   04/29/23 0921  Weight: 139 lb 12.8 oz (63.4 kg)    GENERAL:alert, no distress and comfortable .  Noted poor dentition  LABORATORY DATA:  I have reviewed the data as listed    Component Value Date/Time   NA 138 04/17/2023 0849   NA 139 04/14/2017 0742   K 4.1 04/17/2023 0849   K 4.0 04/14/2017 0742   CL 107 04/17/2023 0849   CO2 27 04/17/2023 0849   CO2 21 (L) 04/14/2017 0742   GLUCOSE 94 04/17/2023 0849   GLUCOSE 247 (H) 04/14/2017 0742   BUN 18 04/17/2023 0849   BUN 13.1 04/14/2017 0742   CREATININE 1.41 (H) 04/17/2023 0849   CREATININE 0.83 08/29/2022 0754   CREATININE 0.9 04/14/2017 0742   CALCIUM 9.0 04/17/2023 0849   CALCIUM 9.2 04/14/2017 0742   PROT 6.0 (L) 04/17/2023 0849   PROT 7.3 04/14/2017 0742   ALBUMIN  3.7 04/17/2023 0849   ALBUMIN  3.9 04/14/2017 0742   AST 16 04/17/2023 0849   AST 13 (L) 08/29/2022 0754   AST 17 04/14/2017 0742   ALT 6 04/17/2023 0849   ALT 6 08/29/2022 0754   ALT 14 04/14/2017 0742   ALKPHOS 78 04/17/2023 0849   ALKPHOS 70 04/14/2017 0742   BILITOT 0.7 04/17/2023 0849   BILITOT 0.4 08/29/2022 0754   BILITOT 0.37 04/14/2017 0742   GFRNONAA 41 (L) 04/17/2023 0849   GFRNONAA >60 08/29/2022 0754   GFRAA >60 01/24/2020 0924   GFRAA >60 07/19/2019 0951    No results found for: SPEP, UPEP  Lab Results  Component Value Date   WBC 5.1 04/17/2023   NEUTROABS 3.1 04/17/2023   HGB 10.8 (L) 04/17/2023   HCT 31.5 (L) 04/17/2023   MCV 100.3 (H) 04/17/2023   PLT 159 04/17/2023      Chemistry      Component Value Date/Time   NA 138 04/17/2023 0849   NA 139 04/14/2017 0742   K 4.1 04/17/2023 0849   K 4.0 04/14/2017 0742   CL 107 04/17/2023 0849   CO2 27 04/17/2023 0849   CO2 21 (L) 04/14/2017 0742   BUN 18  04/17/2023 0849   BUN 13.1 04/14/2017 0742   CREATININE 1.41 (H) 04/17/2023 0849   CREATININE 0.83 08/29/2022 0754   CREATININE 0.9 04/14/2017 0742      Component Value Date/Time   CALCIUM 9.0 04/17/2023 0849   CALCIUM 9.2 04/14/2017 0742   ALKPHOS 78 04/17/2023 0849   ALKPHOS 70 04/14/2017 0742   AST 16 04/17/2023 0849   AST 13 (L) 08/29/2022 0754   AST 17 04/14/2017 0742   ALT 6 04/17/2023 0849   ALT 6 08/29/2022 0754   ALT 14 04/14/2017 0742   BILITOT 0.7 04/17/2023 0849   BILITOT 0.4 08/29/2022 0754   BILITOT 0.37 04/14/2017 0742       RADIOGRAPHIC STUDIES: I have reviewed imaging study with the patient and her husband I have personally reviewed the radiological images as listed and agreed with the findings in the report. CT CHEST ABDOMEN PELVIS W CONTRAST Result Date: 04/25/2023 CLINICAL DATA:  Ovarian cancer, adenocarcinoma of right fallopian tube. Evaluate treatment response. * Tracking Code: BO * EXAM: CT CHEST,  ABDOMEN, AND PELVIS WITH CONTRAST TECHNIQUE: Multidetector CT imaging of the chest, abdomen and pelvis was performed following the standard protocol during bolus administration of intravenous contrast. RADIATION DOSE REDUCTION: This exam was performed according to the departmental dose-optimization program which includes automated exposure control, adjustment of the mA and/or kV according to patient size and/or use of iterative reconstruction technique. CONTRAST:  OMNIPAQUE  IOHEXOL  300 MG/ML  SOLN COMPARISON:  01/14/2023 FINDINGS: CT CHEST FINDINGS Cardiovascular: Right Port-A-Cath tip high right atrium. Aortic atherosclerosis. Mild cardiomegaly, without pericardial effusion. Aortic valve calcification. No central pulmonary embolism, on this non-dedicated study. Mediastinum/Nodes: No supraclavicular adenopathy. No mediastinal or hilar adenopathy. Small hiatal hernia. Suspect mild distal esophageal wall thickening including on 42/2. Lungs/Pleura: No pleural fluid.  Thin-walled cystic lesions are similar to on the prior exam and per prior report are sites of treated metastasis. There also tiny solid nodules which are unchanged. Example left apex 2-3 mm on 27/4. Lateral right upper lobe at 3 mm on 31/4. Subpleural right lower lobe at 3 mm on 86/4. Inferior right middle lobe 3 mm 116/4. Musculoskeletal: Included within the abdomen pelvic section. CT ABDOMEN PELVIS FINDINGS Hepatobiliary: Atypical hepatic morphology with caudate and lateral segment left liver lobe enlargement. No focal liver lesion. Cholecystectomy without biliary duct dilatation. Pancreas: Normal, without mass or ductal dilatation. Spleen: Normal in size, without focal abnormality. Adrenals/Urinary Tract: Normal adrenal glands. Normal kidneys, without hydronephrosis. Normal urinary bladder. Stomach/Bowel: Surgical changes at the proximal stomach, likely related to prior hiatal hernia repair. Colonic stool burden suggests constipation. Ventral abdominal wall hernia contains nonobstructive transverse colon, similar. Normal terminal ileum. Normal small bowel. Vascular/Lymphatic: Advanced aortic and branch vessel atherosclerosis. Abdominal retroperitoneal node adjacent the IMA origin measures 1.2 x 1.1 cm on 71/2 versus 1.2 x 1.0 cm on the prior exam. Slightly more well-defined solid today. The partially calcified right inguinal node measures 1.0 cm on 110/2 versus 9 mm on the prior (when remeasured). A pelvic small bowel mesenteric node measures 9 mm on 93/2 versus 8 mm on the prior (when remeasured). Reproductive: Hysterectomy.  No adnexal mass. Other: No significant free fluid. Mild pelvic floor laxity. No evidence of omental or peritoneal disease. Musculoskeletal: Likely iatrogenic right abdominal wall subcutaneous nodule of 11 mm, more well-defined today. Osteopenia.  Degenerative disc disease at the lumbosacral junction. IMPRESSION: CT CHEST IMPRESSION 1. Similar treated pulmonary metastasis. No new or  progressive disease. 2. No thoracic adenopathy. 3. Small hiatal hernia. Distal esophageal wall thickening is suspected. Correlate with symptoms of esophagitis. 4. Aortic valvular calcifications. Consider echocardiography to evaluate for valvular dysfunction. 5.  Aortic Atherosclerosis (ICD10-I70.0). CT ABDOMEN AND PELVIS IMPRESSION 1. Suspect minimal enlargement of abdominopelvic nodes. Cannot exclude mild disease progression. 2. No new sites of disease. 3. Atypical hepatic morphology for which cirrhosis cannot be excluded. 4.  Possible constipation. 5. Ventral pelvic wall hernia containing nonobstructive transverse colon, similar. Electronically Signed   By: Rockey Kilts M.D.   On: 04/25/2023 09:06

## 2023-04-29 NOTE — Assessment & Plan Note (Signed)
 Her blood pressure is stable She has intermittent elevated blood pressure but she is not symptomatic She will continue atenolol in the morning  We discussed importance of lifestyle changes and risk factor modification for blood pressure control

## 2023-05-05 NOTE — Telephone Encounter (Signed)
 Oral Oncology Patient Advocate Encounter   Received notification re-enrollment for assistance for Zejula  through [employer] has been approved. Patient may continue to receive their medication at $0 from this program.    [employer] phone number [phone].   Effective dates: 05/03/23 through 04/21/24  I have spoken to the patient.  Estefana Moellers, CPhT-Adv Oncology Pharmacy Patient Advocate [facility] Direct Number: [phone]  Fax: [phone]

## 2023-05-14 ENCOUNTER — Encounter: Payer: Self-pay | Admitting: Hematology and Oncology

## 2023-05-15 ENCOUNTER — Telehealth: Payer: Self-pay

## 2023-05-15 NOTE — Telephone Encounter (Signed)
Received email from Patient containing pictures of forms needed for disability claim. Advised Patient that the forms were dark and that it would be difficult to properly complete the forms as received. Patient stated that she will bring the forms to the office and will sign a Release of Information form at that time. No other needs or concerns noted at this time.

## 2023-05-20 ENCOUNTER — Telehealth: Payer: Self-pay

## 2023-05-20 NOTE — Telephone Encounter (Signed)
Notified Patient of completion of Disability Forms. Fax transmission confirmation received. Copy of forms emailed to Patient as requested. No other needs or concerns voiced at this time.

## 2023-05-28 ENCOUNTER — Other Ambulatory Visit: Payer: Self-pay | Admitting: Hematology and Oncology

## 2023-05-28 DIAGNOSIS — N951 Menopausal and female climacteric states: Secondary | ICD-10-CM

## 2023-06-09 ENCOUNTER — Other Ambulatory Visit: Payer: Self-pay

## 2023-06-09 MED ORDER — NIRAPARIB TOSYLATE 100 MG PO TABS: 100.0000 mg | ORAL_TABLET | Freq: Every day | ORAL | 3 refills | Status: DC

## 2023-06-19 ENCOUNTER — Inpatient Hospital Stay: Payer: Medicare Other | Attending: Hematology and Oncology

## 2023-06-19 ENCOUNTER — Inpatient Hospital Stay (HOSPITAL_BASED_OUTPATIENT_CLINIC_OR_DEPARTMENT_OTHER): Payer: Medicare Other | Admitting: Hematology and Oncology

## 2023-06-19 VITALS — BP 130/57 | HR 67 | Temp 97.8°F | Resp 18 | Ht 65.0 in | Wt 145.6 lb

## 2023-06-19 DIAGNOSIS — N183 Chronic kidney disease, stage 3 unspecified: Secondary | ICD-10-CM | POA: Insufficient documentation

## 2023-06-19 DIAGNOSIS — C5701 Malignant neoplasm of right fallopian tube: Secondary | ICD-10-CM

## 2023-06-19 DIAGNOSIS — K089 Disorder of teeth and supporting structures, unspecified: Secondary | ICD-10-CM

## 2023-06-19 DIAGNOSIS — Z7189 Other specified counseling: Secondary | ICD-10-CM

## 2023-06-19 LAB — COMPREHENSIVE METABOLIC PANEL
ALT: 8 U/L (ref 0–44)
AST: 17 U/L (ref 15–41)
Albumin: 3.9 g/dL (ref 3.5–5.0)
Alkaline Phosphatase: 100 U/L (ref 38–126)
Anion gap: 6 (ref 5–15)
BUN: 22 mg/dL (ref 8–23)
CO2: 27 mmol/L (ref 22–32)
Calcium: 9 mg/dL (ref 8.9–10.3)
Chloride: 105 mmol/L (ref 98–111)
Creatinine, Ser: 1.34 mg/dL — ABNORMAL HIGH (ref 0.44–1.00)
GFR, Estimated: 43 mL/min — ABNORMAL LOW (ref >60)
Glucose, Bld: 98 mg/dL (ref 70–99)
Potassium: 4.3 mmol/L (ref 3.5–5.1)
Sodium: 138 mmol/L (ref 135–145)
Total Bilirubin: 0.6 mg/dL (ref 0.0–1.2)
Total Protein: 6.5 g/dL (ref 6.5–8.1)

## 2023-06-19 LAB — CBC WITH DIFFERENTIAL/PLATELET
Abs Immature Granulocytes: 0.02 10*3/uL (ref 0.00–0.07)
Basophils Absolute: 0.1 10*3/uL (ref 0.0–0.1)
Basophils Relative: 1 %
Eosinophils Absolute: 0.3 10*3/uL (ref 0.0–0.5)
Eosinophils Relative: 5 %
HCT: 35.8 % — ABNORMAL LOW (ref 36.0–46.0)
Hemoglobin: 12.2 g/dL (ref 12.0–15.0)
Immature Granulocytes: 0 %
Lymphocytes Relative: 22 %
Lymphs Abs: 1.2 10*3/uL (ref 0.7–4.0)
MCH: 33.7 pg (ref 26.0–34.0)
MCHC: 34.1 g/dL (ref 30.0–36.0)
MCV: 98.9 fL (ref 80.0–100.0)
Monocytes Absolute: 0.6 10*3/uL (ref 0.1–1.0)
Monocytes Relative: 10 %
Neutro Abs: 3.5 10*3/uL (ref 1.7–7.7)
Neutrophils Relative %: 62 %
Platelets: 199 10*3/uL (ref 150–400)
RBC: 3.62 MIL/uL — ABNORMAL LOW (ref 3.87–5.11)
RDW: 12.7 % (ref 11.5–15.5)
WBC: 5.7 10*3/uL (ref 4.0–10.5)
nRBC: 0 % (ref 0.0–0.2)

## 2023-06-19 MED ORDER — SODIUM CHLORIDE 0.9% FLUSH
10.0000 mL | Freq: Once | INTRAVENOUS | Status: AC
  Administered 2023-06-19: 10 mL

## 2023-06-19 MED ORDER — HEPARIN SOD (PORK) LOCK FLUSH 100 UNIT/ML IV SOLN
500.0000 [IU] | Freq: Once | INTRAVENOUS | Status: AC
  Administered 2023-06-19: 500 [IU]

## 2023-06-20 ENCOUNTER — Encounter: Payer: Self-pay | Admitting: Hematology and Oncology

## 2023-06-20 ENCOUNTER — Telehealth: Payer: Self-pay | Admitting: Hematology and Oncology

## 2023-06-20 LAB — CA 125: Cancer Antigen (CA) 125: 23.3 U/mL (ref 0.0–38.1)

## 2023-06-20 NOTE — Assessment & Plan Note (Addendum)
 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

## 2023-06-20 NOTE — Telephone Encounter (Signed)
 Spoke with patient confirming upcoming appointment

## 2023-06-20 NOTE — Assessment & Plan Note (Addendum)
 We discussed importance of risk factor modification and increase oral fluid intake

## 2023-06-20 NOTE — Assessment & Plan Note (Addendum)
 She has very poor dentition requiring dental extraction I recommend the patient to hold niraparib for 5 days prior to dental extraction

## 2023-06-20 NOTE — Progress Notes (Signed)
 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 and invasion into her gallbladder Neg genetics, High grade serous, p53 mutated She responded well to salvage chemotherapy with carboplatin, paclitaxel and bevacizumab with complete response She has been on maintenance niraparib since June 2024 with stable disease control She will continue maintenance niraparib which she tolerated well I plan to repeat imaging study end of March early April for objective assessment of response to therapy Poor dentition She has very poor dentition requiring dental extraction I recommend the patient to hold niraparib for 5 days prior to dental extraction Stage 3 chronic kidney disease, unspecified whether stage 3a or 3b CKD (HCC) We discussed importance of risk factor modification and increase oral fluid intake  Orders Placed This Encounter  Procedures   CT CHEST ABDOMEN PELVIS W CONTRAST    Standing Status:   Future    Expected Date:   07/21/2023    Expiration Date:   06/18/2024    Scheduling Instructions:     No need oral contrast    If indicated for the ordered procedure, I authorize the administration of contrast media per Radiology protocol:   Yes    Does the patient have a contrast media/X-ray dye allergy?:   No    Preferred imaging location?:   [facility]    If indicated for the ordered procedure, I authorize the administration of oral contrast media per Radiology protocol:   Yes     Artis Delay, MD  INTERVAL HISTORY: she returns for treatment follow-up Complications related to previous cycle of chemotherapy included  poor dentition she required dental extraction on March 20   She denies other side effects of treatment so far  PHYSICAL EXAMINATION: ECOG PERFORMANCE STATUS: 0 - Asymptomatic  Vitals:   06/19/23 0829  BP: (!) 130/57  Pulse: 67  Resp: 18  Temp: 97.8 F (36.6 C)  SpO2: 100%   Filed Weights   06/19/23 0829  Weight: 145 lb 9.6 oz (66 kg)    Relevant data reviewed during this visit included CBC and CMP

## 2023-07-23 ENCOUNTER — Other Ambulatory Visit: Payer: Self-pay | Admitting: Hematology and Oncology

## 2023-07-25 ENCOUNTER — Encounter (HOSPITAL_COMMUNITY): Payer: Self-pay

## 2023-07-25 ENCOUNTER — Ambulatory Visit (HOSPITAL_COMMUNITY)
Admission: RE | Admit: 2023-07-25 | Discharge: 2023-07-25 | Disposition: A | Discharge status: Home / Self Care | Payer: Medicare Other | Source: Ambulatory Visit | Attending: Hematology and Oncology | Admitting: Hematology and Oncology

## 2023-07-25 ENCOUNTER — Other Ambulatory Visit: Payer: Self-pay

## 2023-07-25 ENCOUNTER — Inpatient Hospital Stay: Payer: Medicare Other | Attending: Hematology and Oncology

## 2023-07-25 DIAGNOSIS — Z7189 Other specified counseling: Secondary | ICD-10-CM

## 2023-07-25 DIAGNOSIS — Z79899 Other long term (current) drug therapy: Secondary | ICD-10-CM | POA: Insufficient documentation

## 2023-07-25 DIAGNOSIS — N132 Hydronephrosis with renal and ureteral calculous obstruction: Secondary | ICD-10-CM | POA: Insufficient documentation

## 2023-07-25 DIAGNOSIS — C5701 Malignant neoplasm of right fallopian tube: Secondary | ICD-10-CM | POA: Insufficient documentation

## 2023-07-25 LAB — CBC WITH DIFFERENTIAL/PLATELET
Abs Immature Granulocytes: 0.02 10*3/uL (ref 0.00–0.07)
Basophils Absolute: 0.1 10*3/uL (ref 0.0–0.1)
Basophils Relative: 1 %
Eosinophils Absolute: 0.3 10*3/uL (ref 0.0–0.5)
Eosinophils Relative: 5 %
HCT: 35.4 % — ABNORMAL LOW (ref 36.0–46.0)
Hemoglobin: 12.1 g/dL (ref 12.0–15.0)
Immature Granulocytes: 0 %
Lymphocytes Relative: 17 %
Lymphs Abs: 1.1 10*3/uL (ref 0.7–4.0)
MCH: 33.4 pg (ref 26.0–34.0)
MCHC: 34.2 g/dL (ref 30.0–36.0)
MCV: 97.8 fL (ref 80.0–100.0)
Monocytes Absolute: 0.4 10*3/uL (ref 0.1–1.0)
Monocytes Relative: 7 %
Neutro Abs: 4.6 10*3/uL (ref 1.7–7.7)
Neutrophils Relative %: 70 %
Platelets: 201 10*3/uL (ref 150–400)
RBC: 3.62 MIL/uL — ABNORMAL LOW (ref 3.87–5.11)
RDW: 12.6 % (ref 11.5–15.5)
WBC: 6.6 10*3/uL (ref 4.0–10.5)
nRBC: 0 % (ref 0.0–0.2)

## 2023-07-25 LAB — COMPREHENSIVE METABOLIC PANEL WITH GFR
ALT: 8 U/L (ref 0–44)
AST: 17 U/L (ref 15–41)
Albumin: 3.8 g/dL (ref 3.5–5.0)
Alkaline Phosphatase: 97 U/L (ref 38–126)
Anion gap: 7 (ref 5–15)
BUN: 21 mg/dL (ref 8–23)
CO2: 27 mmol/L (ref 22–32)
Calcium: 9.2 mg/dL (ref 8.9–10.3)
Chloride: 105 mmol/L (ref 98–111)
Creatinine, Ser: 1.27 mg/dL — ABNORMAL HIGH (ref 0.44–1.00)
GFR, Estimated: 46 mL/min — ABNORMAL LOW (ref >60)
Glucose, Bld: 115 mg/dL — ABNORMAL HIGH (ref 70–99)
Potassium: 4.1 mmol/L (ref 3.5–5.1)
Sodium: 139 mmol/L (ref 135–145)
Total Bilirubin: 0.6 mg/dL (ref 0.0–1.2)
Total Protein: 6.4 g/dL — ABNORMAL LOW (ref 6.5–8.1)

## 2023-07-25 MED ORDER — IOHEXOL 300 MG/ML  SOLN
80.0000 mL | Freq: Once | INTRAMUSCULAR | Status: AC | PRN
  Administered 2023-07-25: 80 mL via INTRAVENOUS

## 2023-07-25 MED ORDER — HEPARIN SOD (PORK) LOCK FLUSH 100 UNIT/ML IV SOLN
500.0000 [IU] | Freq: Once | INTRAVENOUS | Status: AC
  Administered 2023-07-25: 500 [IU] via INTRAVENOUS

## 2023-07-25 MED ORDER — SODIUM CHLORIDE 0.9% FLUSH
10.0000 mL | Freq: Once | INTRAVENOUS | Status: AC
  Administered 2023-07-25: 10 mL

## 2023-07-25 MED ORDER — SODIUM CHLORIDE (PF) 0.9 % IJ SOLN
INTRAMUSCULAR | Status: AC
  Filled 2023-07-25: qty 50

## 2023-07-25 MED ORDER — HEPARIN SOD (PORK) LOCK FLUSH 100 UNIT/ML IV SOLN
INTRAVENOUS | Status: AC
  Filled 2023-07-25: qty 5

## 2023-07-26 LAB — CA 125: Cancer Antigen (CA) 125: 30.5 U/mL (ref 0.0–38.1)

## 2023-07-28 ENCOUNTER — Other Ambulatory Visit (HOSPITAL_COMMUNITY): Payer: Medicare Other

## 2023-07-28 ENCOUNTER — Other Ambulatory Visit: Payer: Medicare Other

## 2023-07-28 NOTE — Assessment & Plan Note (Signed)
 The patient was diagnosed with fallopian tube cancer in 2018 status post surgery and completion of adjuvant chemotherapy in 2019 She developed recurrent disease in 2020 after presentation with high-grade small bowel obstruction treated with carboplatin, Doxil and bevacizumab followed by bevacizumab maintenance She had gallbladder surgery in November 2023 and adjacent lymph node came back positive for recurrent high-grade serous carcinoma and she was treated with carboplatin, paclitaxel and bevacizumab  She has now progressed on maintenance niraparib Technically, she is still carboplatin sensitive, that was discontinued in 2024 due to severe pancytopenia I reviewed imaging study from April 2025 with the patient which showed signs of disease progression with recurrent disease in her peritoneum and lymphadenopathy At this point, I do not advocate for biopsy She is still platinum sensitive The priority would be to resolve her right hydronephrosis and I will send urgent urology consult We discussed risk, benefits, side effects of combination of carboplatin with gemcitabine versus carboplatin with doxil versus carboplatin with paclitaxel Ultimately, she would like to proceed with combination of carboplatin with gemcitabine I would probably wait until 2 to 3 weeks from now after she has resolution of hydronephrosis and dental extraction

## 2023-07-30 ENCOUNTER — Encounter: Payer: Self-pay | Admitting: Hematology and Oncology

## 2023-07-30 ENCOUNTER — Inpatient Hospital Stay (HOSPITAL_BASED_OUTPATIENT_CLINIC_OR_DEPARTMENT_OTHER): Admitting: Hematology and Oncology

## 2023-07-30 ENCOUNTER — Telehealth: Payer: Self-pay | Admitting: Oncology

## 2023-07-30 ENCOUNTER — Telehealth: Payer: Self-pay

## 2023-07-30 VITALS — BP 149/65 | HR 66 | Temp 98.2°F | Resp 17 | Wt 145.6 lb

## 2023-07-30 DIAGNOSIS — N1330 Unspecified hydronephrosis: Secondary | ICD-10-CM | POA: Diagnosis not present

## 2023-07-30 DIAGNOSIS — K089 Disorder of teeth and supporting structures, unspecified: Secondary | ICD-10-CM | POA: Diagnosis not present

## 2023-07-30 DIAGNOSIS — Z79899 Other long term (current) drug therapy: Secondary | ICD-10-CM | POA: Diagnosis not present

## 2023-07-30 DIAGNOSIS — C5701 Malignant neoplasm of right fallopian tube: Secondary | ICD-10-CM | POA: Diagnosis not present

## 2023-07-30 DIAGNOSIS — N132 Hydronephrosis with renal and ureteral calculous obstruction: Secondary | ICD-10-CM | POA: Diagnosis not present

## 2023-07-30 DIAGNOSIS — Z7189 Other specified counseling: Secondary | ICD-10-CM

## 2023-07-30 NOTE — Telephone Encounter (Signed)
 Received notification from MD that Jesse Sans has been discontinued. I called [employer] [facility] [employer] at [phone] and cancelled enrollment for patient.  Of note, once MD has decided on new medication, patient has an active [facility] that is good through November of 2025.    Ardeen Fillers, CPhT Pharmacy Technician Coordinator [facility] [phone] (Ph) 07/30/2023 3:06 PM

## 2023-07-30 NOTE — Assessment & Plan Note (Addendum)
 This is due to presence of kidney stone Will send urgent urology consult This needs to resolve prior to starting her back on treatment

## 2023-07-30 NOTE — Assessment & Plan Note (Addendum)
 She is waiting for dental extraction I recommend the patient to reach out to her dentist for urgent dental extraction prior to starting her on treatment

## 2023-07-30 NOTE — Progress Notes (Signed)
 DISCONTINUE ON PATHWAY REGIMEN - Ovarian     A cycle is every 21 days:     Bevacizumab-xxxx      Paclitaxel      Carboplatin   **Always confirm dose/schedule in your pharmacy ordering system**  PRIOR TREATMENT: [id number]: Bevacizumab 15 mg/kg + Carboplatin AUC=5 + Paclitaxel 175 mg/m2 q21 Days; Re-evaluate Every 3 Cycles, Treat until Complete Response, Unacceptable Toxicity, or Disease Progression  START OFF PATHWAY REGIMEN - Ovarian   [id number]:Carboplatin AUC=4 D1 + Gemcitabine 800 mg/m2 D1, 8 q21 Days:   A cycle is every 21 days:     Gemcitabine      Carboplatin   **Always confirm dose/schedule in your pharmacy ordering system**  Patient Characteristics: Recurrent or Progressive Disease, Fourth Line and Beyond, Low, Medium, or Unknown Folate Receptor Alpha Expression OR  Prior Mirvetuximab OR Not a Candidate for Mirvetuximab, HER2 Negative/Unknown BRCA Mutation Status: Absent Therapeutic Status: Recurrent or Progressive Disease Line of Therapy: Fourth Line and Beyond Folate Receptor Alpha (FR?) Expression: Unknown Mirvetuximab Candidacy: Not a Candidate for Mirvetuximab OR Prior Mirvetuximab HER2 Expression Status by IHC: Negative (IHC 0, 1+) Intent of Therapy: Non-Curative / Palliative Intent, Discussed with Patient

## 2023-07-30 NOTE — Assessment & Plan Note (Addendum)
 Counseled and coordinated advanced care planning today.  Patient is aware she has stage IV disease and disease is incurable. Treatment offered is palliative in nature. We discussed a realistic and effective plan that will be reviewed and updated on an ongoing basis as indicated. We discussed the following: Risks, benefits and alternatives to the various treatment options Discussed patient's personal belief/values/goals Discussed palliative care options, ways to avoid hospitalization and patient's desire for care if decision making capacity is affected Discussed Code Status and she confirmed DNR status ACP form completed

## 2023-07-30 NOTE — Telephone Encounter (Signed)
 Left a message for [facility] in [place] regarding referral.

## 2023-07-30 NOTE — Progress Notes (Signed)
 [facility] OFFICE PROGRESS NOTE  Patient Care Team: Lupita Raider, MD as PCP - General (Family Medicine) Artis Delay, MD as Consulting Physician (Hematology and Oncology)  Assessment & Plan Adenocarcinoma of right fallopian tube [facility]) The patient was diagnosed with fallopian tube cancer in 2018 status post surgery and completion of adjuvant chemotherapy in 2019 She developed recurrent disease in 2020 after presentation with high-grade small bowel obstruction treated with carboplatin, Doxil and bevacizumab followed by bevacizumab maintenance She had gallbladder surgery in November 2023 and adjacent lymph node came back positive for recurrent high-grade serous carcinoma and she was treated with carboplatin, paclitaxel and bevacizumab  She has now progressed on maintenance niraparib Technically, she is still carboplatin sensitive, that was discontinued in 2024 due to severe pancytopenia I reviewed imaging study from April 2025 with the patient which showed signs of disease progression with recurrent disease in her peritoneum and lymphadenopathy At this point, I do not advocate for biopsy She is still platinum sensitive The priority would be to resolve her right hydronephrosis and I will send urgent urology consult We discussed risk, benefits, side effects of combination of carboplatin with gemcitabine versus carboplatin with doxil versus carboplatin with paclitaxel Ultimately, she would like to proceed with combination of carboplatin with gemcitabine I would probably wait until 2 to 3 weeks from now after she has resolution of hydronephrosis and dental extraction  Hydronephrosis of right kidney This is due to presence of kidney stone Will send urgent urology consult This needs to resolve prior to starting her back on treatment Poor dentition She is waiting for dental extraction I recommend the patient to reach out to her dentist for urgent dental extraction prior to  starting her on treatment Goals of care, counseling/discussion Counseled and coordinated advanced care planning today.  Patient is aware she has stage IV disease and disease is incurable. Treatment offered is palliative in nature. We discussed a realistic and effective plan that will be reviewed and updated on an ongoing basis as indicated. We discussed the following: Risks, benefits and alternatives to the various treatment options Discussed patient's personal belief/values/goals Discussed palliative care options, ways to avoid hospitalization and patient's desire for care if decision making capacity is affected Discussed Code Status and she confirmed DNR status ACP form completed   Orders Placed This Encounter  Procedures   Ambulatory referral to Urology    Referral Priority:   Urgent    Referral Type:   Consultation    Referral Reason:   Specialty Services Required    Requested Specialty:   Urology    Number of Visits Requested:   1     Artis Delay, MD  INTERVAL HISTORY: she returns for surveillance follow-up She is doing well She is awaiting for dental extraction for poor dentition Denies signs or symptoms of abdominal pain no changes in bowel habits I reviewed multiple imaging studies with the patient and her husband PHYSICAL EXAMINATION: ECOG PERFORMANCE STATUS: 0 - Asymptomatic  Vitals:   07/30/23 1105  BP: (!) 149/65  Pulse: 66  Resp: 17  Temp: 98.2 F (36.8 C)  SpO2: 95%   Filed Weights   07/30/23 1105  Weight: 145 lb 9.6 oz (66 kg)    Relevant data reviewed during this visit included CBC, CMP and CT imaging from April 2025

## 2023-07-31 ENCOUNTER — Encounter: Payer: Self-pay | Admitting: Hematology and Oncology

## 2023-07-31 ENCOUNTER — Other Ambulatory Visit: Payer: Self-pay

## 2023-07-31 ENCOUNTER — Telehealth: Payer: Self-pay | Admitting: Oncology

## 2023-07-31 NOTE — Telephone Encounter (Signed)
 Left a message for Holly [name] at [facility] in [place].

## 2023-07-31 NOTE — Telephone Encounter (Signed)
 Called Holly [name] and advised her of appointment to see Dr. Pete Glatter with [facility] on 08/04/23 at 10:30 at the [facility] location.  She verbalized understanding and agreement.

## 2023-08-01 ENCOUNTER — Ambulatory Visit: Payer: Medicare Other | Admitting: Hematology and Oncology

## 2023-08-01 ENCOUNTER — Other Ambulatory Visit: Payer: Self-pay

## 2023-08-01 ENCOUNTER — Encounter: Payer: Self-pay | Admitting: Hematology and Oncology

## 2023-08-04 ENCOUNTER — Ambulatory Visit (INDEPENDENT_AMBULATORY_CARE_PROVIDER_SITE_OTHER): Admitting: Urology

## 2023-08-04 ENCOUNTER — Encounter: Payer: Self-pay | Admitting: Urology

## 2023-08-04 VITALS — BP 112/69 | HR 80 | Ht 65.0 in | Wt 142.0 lb

## 2023-08-04 DIAGNOSIS — N201 Calculus of ureter: Secondary | ICD-10-CM | POA: Diagnosis not present

## 2023-08-04 DIAGNOSIS — N1330 Unspecified hydronephrosis: Secondary | ICD-10-CM | POA: Diagnosis not present

## 2023-08-04 DIAGNOSIS — R8290 Unspecified abnormal findings in urine: Secondary | ICD-10-CM

## 2023-08-04 NOTE — H&P (View-Only) (Signed)
 Assessment: 1. Hydronephrosis of right kidney   2. Ureteral calculus   3. Abnormal urine findings     Plan: I personally reviewed the patient's chart including provider notes, lab and imaging results. I personally reviewed the CT study from 07/25/2023. The 4 mm calculus noted on the CT scan appears to have been present on prior CTs going back to May 2024. I would like to review the studies with radiology. The exact cause of her right hydronephrosis is unclear. Urine sent for microscopic urinalysis. Patient provided strainer. Recommend further management with cystoscopy, retrograde pyelogram, possible ureteroscopy, possible stone manipulation, and insertion of ureteral stent.  Chief Complaint:  Chief Complaint  Patient presents with   Nephrolithiasis    History of Present Illness:  Holly [name] is a 68 y.o. female who is seen in consultation from Almeda Jacobs, MD for evaluation of right ureteral calculus. She underwent evaluation with CT imaging on 07/25/2023.  She was found to have right hydroureteronephrosis to the level of a 4 mm stone in the distal right ureter just below the pelvic inlet.  No other stones were seen. No prior history of kidney stones.  She has not had any flank pain.  No dysuria or gross hematuria. She had a UTI approximately 6 months ago.   Past Medical History:  Past Medical History:  Diagnosis Date   Complication of anesthesia    slow to wake up sometimes    GERD (gastroesophageal reflux disease)    History of chemotherapy    History of hiatal hernia    Hypertension    Ovarian cancer (HCC) dx'd 12/2016   recurrent dz 02/2019/2023   PONV (postoperative nausea and vomiting)     Past Surgical History:  Past Surgical History:  Procedure Laterality Date   ABDOMINAL HYSTERECTOMY  2018   DEBULKING N/A 12/26/2016   Procedure: RADICAL TUMOR DEBULKING; OMENTECTOMY;  Surgeon: Alphonso Aschoff, MD;  Location: [facility];  Service: Gynecology;  Laterality: N/A;    ganglion cyst removed     Hital Hernia     IR FLUORO GUIDE PORT INSERTION RIGHT  01/16/2017   IR IMAGING GUIDED PORT INSERTION  03/16/2019   IR REMOVAL TUN ACCESS W/ PORT W/O FL MOD SED  06/10/2017   IR US  GUIDE VASC ACCESS RIGHT  01/16/2017   LAPAROTOMY N/A 12/26/2016   Procedure: EXPLORATORY LAPAROTOMY;  Surgeon: Alphonso Aschoff, MD;  Location: [facility];  Service: Gynecology;  Laterality: N/A;   left morton's neuroma surgery      Nissan Fundoplication      Allergies:  Allergies  Allergen Reactions   Dilaudid [Hydromorphone Hcl] Nausea And Vomiting   Morphine And Codeine Nausea Only   Sudafed [Pseudoephedrine Hcl] Anxiety and Other (See Comments)    Nervous/jittery    Family History:  Family History  Problem Relation Age of Onset   Diabetes Mother    Diabetes Father    Hypertension Father    Stroke Father    COPD Father    Heart attack Sister 55   Dementia Maternal Aunt    Bone cancer Paternal Uncle        deceased at 63   Dementia Maternal Grandmother    Heart attack Paternal Grandmother    Heart attack Paternal Grandfather    Healthy Son    Healthy Son     Social History:  Social History   Tobacco Use   Smoking status: Never    Passive exposure: Past   Smokeless tobacco: Never  Vaping  Use   Vaping status: Never Used  Substance Use Topics   Alcohol use: No   Drug use: No    Review of symptoms:  Constitutional:  Negative for unexplained weight loss, night sweats, fever, chills ENT:  Negative for nose bleeds, sinus pain, painful swallowing CV:  Negative for chest pain, shortness of breath, exercise intolerance, palpitations, loss of consciousness Resp:  Negative for cough, wheezing, shortness of breath GI:  Negative for nausea, vomiting, diarrhea, bloody stools GU:  Positives noted in HPI; otherwise negative for gross hematuria, dysuria, urinary incontinence Neuro:  Negative for seizures, poor balance, limb weakness, slurred speech Psych:  Negative for lack of  energy, depression, anxiety Endocrine:  Negative for polydipsia, polyuria, symptoms of hypoglycemia (dizziness, hunger, sweating) Hematologic:  Negative for anemia, purpura, petechia, prolonged or excessive bleeding, use of anticoagulants  Allergic:  Negative for difficulty breathing or choking as a result of exposure to anything; no shellfish allergy; no allergic response (rash/itch) to materials, foods  Physical exam: BP 112/69   Pulse 80   Ht 5\' 5"  (1.651 m)   Wt 142 lb (64.4 kg)   BMI 23.63 kg/m  GENERAL APPEARANCE:  Well appearing, well developed, well nourished, NAD HEENT: Atraumatic, Normocephalic, oropharynx clear. NECK: Supple without lymphadenopathy or thyromegaly. LUNGS: Clear to auscultation bilaterally. HEART: Regular Rate and Rhythm without murmurs, gallops, or rubs. ABDOMEN: Soft, non-tender, No Masses. EXTREMITIES: Moves all extremities well.  Without clubbing, cyanosis, or edema. NEUROLOGIC:  Alert and oriented x 3, normal gait, CN II-XII grossly intact.  MENTAL STATUS:  Appropriate. BACK:  Non-tender to palpation.  No CVAT SKIN:  Warm, dry and intact.    Results: U/A dipstick:  2+ blood, 1+ LE

## 2023-08-04 NOTE — Progress Notes (Signed)
 Assessment: 1. Hydronephrosis of right kidney   2. Ureteral calculus   3. Abnormal urine findings     Plan: I personally reviewed the patient's chart including provider notes, lab and imaging results. I personally reviewed the CT study from 07/25/2023. The 4 mm calculus noted on the CT scan appears to have been present on prior CTs going back to May 2024. I would like to review the studies with radiology. The exact cause of her right hydronephrosis is unclear. Urine sent for microscopic urinalysis. Patient provided strainer. Recommend further management with cystoscopy, retrograde pyelogram, possible ureteroscopy, possible stone manipulation, and insertion of ureteral stent.  Chief Complaint:  Chief Complaint  Patient presents with   Nephrolithiasis    History of Present Illness:  Holly [name] is a 68 y.o. female who is seen in consultation from Almeda Jacobs, MD for evaluation of right ureteral calculus. She underwent evaluation with CT imaging on 07/25/2023.  She was found to have right hydroureteronephrosis to the level of a 4 mm stone in the distal right ureter just below the pelvic inlet.  No other stones were seen. No prior history of kidney stones.  She has not had any flank pain.  No dysuria or gross hematuria. She had a UTI approximately 6 months ago.   Past Medical History:  Past Medical History:  Diagnosis Date   Complication of anesthesia    slow to wake up sometimes    GERD (gastroesophageal reflux disease)    History of chemotherapy    History of hiatal hernia    Hypertension    Ovarian cancer (HCC) dx'd 12/2016   recurrent dz 02/2019/2023   PONV (postoperative nausea and vomiting)     Past Surgical History:  Past Surgical History:  Procedure Laterality Date   ABDOMINAL HYSTERECTOMY  2018   DEBULKING N/A 12/26/2016   Procedure: RADICAL TUMOR DEBULKING; OMENTECTOMY;  Surgeon: Alphonso Aschoff, MD;  Location: [facility];  Service: Gynecology;  Laterality: N/A;    ganglion cyst removed     Hital Hernia     IR FLUORO GUIDE PORT INSERTION RIGHT  01/16/2017   IR IMAGING GUIDED PORT INSERTION  03/16/2019   IR REMOVAL TUN ACCESS W/ PORT W/O FL MOD SED  06/10/2017   IR US  GUIDE VASC ACCESS RIGHT  01/16/2017   LAPAROTOMY N/A 12/26/2016   Procedure: EXPLORATORY LAPAROTOMY;  Surgeon: Alphonso Aschoff, MD;  Location: [facility];  Service: Gynecology;  Laterality: N/A;   left morton's neuroma surgery      Nissan Fundoplication      Allergies:  Allergies  Allergen Reactions   Dilaudid [Hydromorphone Hcl] Nausea And Vomiting   Morphine And Codeine Nausea Only   Sudafed [Pseudoephedrine Hcl] Anxiety and Other (See Comments)    Nervous/jittery    Family History:  Family History  Problem Relation Age of Onset   Diabetes Mother    Diabetes Father    Hypertension Father    Stroke Father    COPD Father    Heart attack Sister 55   Dementia Maternal Aunt    Bone cancer Paternal Uncle        deceased at 63   Dementia Maternal Grandmother    Heart attack Paternal Grandmother    Heart attack Paternal Grandfather    Healthy Son    Healthy Son     Social History:  Social History   Tobacco Use   Smoking status: Never    Passive exposure: Past   Smokeless tobacco: Never  Vaping  Use   Vaping status: Never Used  Substance Use Topics   Alcohol use: No   Drug use: No    Review of symptoms:  Constitutional:  Negative for unexplained weight loss, night sweats, fever, chills ENT:  Negative for nose bleeds, sinus pain, painful swallowing CV:  Negative for chest pain, shortness of breath, exercise intolerance, palpitations, loss of consciousness Resp:  Negative for cough, wheezing, shortness of breath GI:  Negative for nausea, vomiting, diarrhea, bloody stools GU:  Positives noted in HPI; otherwise negative for gross hematuria, dysuria, urinary incontinence Neuro:  Negative for seizures, poor balance, limb weakness, slurred speech Psych:  Negative for lack of  energy, depression, anxiety Endocrine:  Negative for polydipsia, polyuria, symptoms of hypoglycemia (dizziness, hunger, sweating) Hematologic:  Negative for anemia, purpura, petechia, prolonged or excessive bleeding, use of anticoagulants  Allergic:  Negative for difficulty breathing or choking as a result of exposure to anything; no shellfish allergy; no allergic response (rash/itch) to materials, foods  Physical exam: BP 112/69   Pulse 80   Ht 5\' 5"  (1.651 m)   Wt 142 lb (64.4 kg)   BMI 23.63 kg/m  GENERAL APPEARANCE:  Well appearing, well developed, well nourished, NAD HEENT: Atraumatic, Normocephalic, oropharynx clear. NECK: Supple without lymphadenopathy or thyromegaly. LUNGS: Clear to auscultation bilaterally. HEART: Regular Rate and Rhythm without murmurs, gallops, or rubs. ABDOMEN: Soft, non-tender, No Masses. EXTREMITIES: Moves all extremities well.  Without clubbing, cyanosis, or edema. NEUROLOGIC:  Alert and oriented x 3, normal gait, CN II-XII grossly intact.  MENTAL STATUS:  Appropriate. BACK:  Non-tender to palpation.  No CVAT SKIN:  Warm, dry and intact.    Results: U/A dipstick:  2+ blood, 1+ LE

## 2023-08-05 ENCOUNTER — Telehealth: Payer: Self-pay | Admitting: Urology

## 2023-08-05 ENCOUNTER — Other Ambulatory Visit: Payer: Self-pay

## 2023-08-05 LAB — URINALYSIS, MICROSCOPIC ONLY
Bacteria, UA: NONE SEEN
Casts: NONE SEEN /LPF

## 2023-08-05 NOTE — Telephone Encounter (Signed)
 Would like someone to call her to go over her urine results.

## 2023-08-06 ENCOUNTER — Telehealth: Payer: Self-pay

## 2023-08-06 ENCOUNTER — Ambulatory Visit: Payer: Self-pay | Admitting: Urology

## 2023-08-06 DIAGNOSIS — N1330 Unspecified hydronephrosis: Secondary | ICD-10-CM

## 2023-08-06 NOTE — Telephone Encounter (Signed)
 Preop at [facility] long called to request orders be placed for procedure.

## 2023-08-06 NOTE — Progress Notes (Addendum)
Surgery orders requested with Crystal at Dr. Laverle Hobby office.

## 2023-08-07 ENCOUNTER — Encounter: Payer: Self-pay | Admitting: Hematology and Oncology

## 2023-08-11 ENCOUNTER — Other Ambulatory Visit: Payer: Self-pay | Admitting: Hematology and Oncology

## 2023-08-11 DIAGNOSIS — C5701 Malignant neoplasm of right fallopian tube: Secondary | ICD-10-CM

## 2023-08-11 DIAGNOSIS — Z7189 Other specified counseling: Secondary | ICD-10-CM

## 2023-08-12 NOTE — Patient Instructions (Signed)
 SURGICAL WAITING ROOM VISITATION Patients having surgery or a procedure may have no more than 2 support people in the waiting area - these visitors may rotate in the visitor waiting room.   If the patient needs to stay at the hospital during part of their recovery, the visitor guidelines for inpatient rooms apply.  PRE-OP VISITATION  Pre-op nurse will coordinate an appropriate time for 1 support person to accompany the patient in pre-op.  This support person may not rotate.  This visitor will be contacted when the time is appropriate for the visitor to come back in the pre-op area.  Please refer to the [facility] website for the visitor guidelines for Inpatients (after your surgery is over and you are in a regular room).  You are not required to quarantine at this time prior to your surgery. However, you must do this: Hand Hygiene often Do NOT share personal items Notify your provider if you are in close contact with someone who has COVID or you develop fever 100.4 or greater, new onset of sneezing, cough, sore throat, shortness of breath or body aches.  If you test positive for Covid or have been in contact with anyone that has tested positive in the last 10 days please notify you surgeon.    Your procedure is scheduled on:  Tuesday  August 19, 2023  Report to [facility] Main Entrance: [address] entrance where the [employer] is available.   Report to admitting at:  1:15 PM  Call this number if you have any questions or problems the morning of surgery [phone]  DO NOT EAT OR DRINK ANYTHING AFTER MIDNIGHT THE NIGHT PRIOR TO YOUR SURGERY / PROCEDURE.   FOLLOW  ANY ADDITIONAL PRE OP INSTRUCTIONS YOU RECEIVED FROM YOUR SURGEON'S OFFICE!!!   Oral Hygiene is also important to reduce your risk of infection.        Remember - BRUSH YOUR TEETH THE MORNING OF SURGERY WITH YOUR REGULAR TOOTHPASTE  Do NOT smoke after Midnight the night before surgery.  STOP TAKING all Vitamins,  Herbs and supplements 1 week before your surgery.   Take ONLY these medicines the morning of surgery with A SIP OF WATER: Atenolol , venlafaxine  and Tylenol  if needed for pain. You may use your Eye drops and nasal spray if needed.   You may not have any metal on your body including hair pins, jewelry, and body piercing  Do not wear make-up, lotions, powders, perfumes  or deodorant  Do not wear nail polish including gel and S&S, artificial / acrylic nails, or any other type of covering on natural nails including finger and toenails. If you have artificial nails, gel coating, etc., that needs to be removed by a nail salon, Please have this removed prior to surgery. Not doing so may mean that your surgery could be cancelled or delayed if the Surgeon or anesthesia staff feels like they are unable to monitor you safely.   Do not shave 48 hours prior to surgery to avoid nicks in your skin which may contribute to postoperative infections.   Contacts, Hearing Aids, dentures or bridgework may not be worn into surgery. DENTURES WILL BE REMOVED PRIOR TO SURGERY PLEASE DO NOT APPLY "Poly grip" OR ADHESIVES!!!  Patients discharged on the day of surgery will not be allowed to drive home.  Someone NEEDS to stay with you for the first 24 hours after anesthesia.  Do not bring your home medications to the hospital. The Pharmacy will dispense medications listed  on your medication list to you during your admission in the Hospital.  Please read over the following fact sheets you were given: IF YOU HAVE QUESTIONS ABOUT YOUR PRE-OP INSTRUCTIONS, PLEASE CALL [phone].   [facility] - Preparing for Surgery Before surgery, you can play an important role.  Because skin is not sterile, your skin needs to be as free of germs as possible.  You can reduce the number of germs on your skin by washing with CHG (chlorahexidine gluconate) soap before surgery.  CHG is an antiseptic cleaner which kills germs and bonds with the  skin to continue killing germs even after washing. Please DO NOT use if you have an allergy to CHG or antibacterial soaps.  If your skin becomes reddened/irritated stop using the CHG and inform your nurse when you arrive at Short Stay. Do not shave (including legs and underarms) for at least 48 hours prior to the first CHG shower.  You may shave your face/neck.  Please follow these instructions carefully:  1.  Shower with CHG Soap the night before surgery and the  morning of surgery.  2.  If you choose to wash your hair, wash your hair first as usual with your normal  shampoo.  3.  After you shampoo, rinse your hair and body thoroughly to remove the shampoo.                             4.  Use CHG as you would any other liquid soap.  You can apply chg directly to the skin and wash.  Gently with a scrungie or clean washcloth.  5.  Apply the CHG Soap to your body ONLY FROM THE NECK DOWN.   Do not use on face/ open                           Wound or open sores. Avoid contact with eyes, ears mouth and genitals (private parts).                       Wash face,  Genitals (private parts) with your normal soap.             6.  Wash thoroughly, paying special attention to the area where your  surgery  will be performed.  7.  Thoroughly rinse your body with warm water from the neck down.  8.  DO NOT shower/wash with your normal soap after using and rinsing off the CHG Soap.            9.  Pat yourself dry with a clean towel.            10.  Wear clean pajamas.            11.  Place clean sheets on your bed the night of your first shower and do not  sleep with pets.  ON THE DAY OF SURGERY : Do not apply any lotions/deodorants the morning of surgery.  Please wear clean clothes to the hospital/surgery center.    FAILURE TO FOLLOW THESE INSTRUCTIONS MAY RESULT IN THE CANCELLATION OF YOUR SURGERY  PATIENT SIGNATURE_________________________________  NURSE  SIGNATURE__________________________________  ________________________________________________________________________

## 2023-08-12 NOTE — Progress Notes (Signed)
 COVID Vaccine received:  [x]  No []  Yes Date of any COVID positive Test in last 90 days:  none  PCP - Glena Landau, MD  Cardiologist - none Oncology- Almeda Jacobs, MD   Chest x-ray - 10-25-2022  2v  Epic EKG -  06-17-2022  will repeat Stress Test -  ECHO - 09-15-2019  Epic Cardiac Cath -   Bowel Prep - [x]  No  []   Yes ______  Pacemaker / ICD device [x]  No []  Yes   Spinal Cord Stimulator:[x]  No []  Yes       History of Sleep Apnea? [x]  No []  Yes   CPAP used?- [x]  No []  Yes    Does the patient monitor blood sugar?   [x]  N/A   []  No []  Yes  Patient has: [x]  NO Hx DM   []  Pre-DM   []  DM1  []   DM2  Blood Thinner / Instructions: none Aspirin Instructions:  none  ERAS Protocol Ordered: [x]  No  []  Yes Patient is to be NPO after: Midnight prior  Dental hx: []  Dentures:  []  N/A      []  Bridge or Partial:                   [x]  Loose or Damaged teeth:   Activity level: Patient is able to climb a flight of stairs without difficulty; [x]  No CP  but would have SOB.   Patient can perform ADLs without assistance.   Anesthesia review: current chemo via portacath, HTN, pancytopenia, CKD3, Patient slow to wake in the past and has PONV.   Patient denies shortness of breath, fever, cough and chest pain at PAT appointment.  Patient verbalized understanding and agreement to the Pre-Surgical Instructions that were given to them at this PAT appointment. Patient was also educated of the need to review these PAT instructions again prior to her surgery.I reviewed the appropriate phone numbers to call if they have any and questions or concerns.

## 2023-08-13 ENCOUNTER — Encounter (HOSPITAL_COMMUNITY): Payer: Self-pay

## 2023-08-13 ENCOUNTER — Encounter (HOSPITAL_COMMUNITY)
Admission: RE | Admit: 2023-08-13 | Discharge: 2023-08-13 | Disposition: A | Discharge status: Home / Self Care | Source: Ambulatory Visit | Attending: Urology | Admitting: Urology

## 2023-08-13 ENCOUNTER — Other Ambulatory Visit: Payer: Self-pay

## 2023-08-13 VITALS — BP 140/74 | HR 56 | Temp 98.1°F | Resp 16 | Ht 65.0 in | Wt 145.0 lb

## 2023-08-13 DIAGNOSIS — Z01812 Encounter for preprocedural laboratory examination: Secondary | ICD-10-CM | POA: Diagnosis present

## 2023-08-13 DIAGNOSIS — K219 Gastro-esophageal reflux disease without esophagitis: Secondary | ICD-10-CM | POA: Diagnosis not present

## 2023-08-13 DIAGNOSIS — G629 Polyneuropathy, unspecified: Secondary | ICD-10-CM | POA: Diagnosis not present

## 2023-08-13 DIAGNOSIS — I447 Left bundle-branch block, unspecified: Secondary | ICD-10-CM | POA: Diagnosis not present

## 2023-08-13 DIAGNOSIS — N132 Hydronephrosis with renal and ureteral calculous obstruction: Secondary | ICD-10-CM | POA: Diagnosis not present

## 2023-08-13 DIAGNOSIS — Z01818 Encounter for other preprocedural examination: Secondary | ICD-10-CM | POA: Insufficient documentation

## 2023-08-13 DIAGNOSIS — Z9221 Personal history of antineoplastic chemotherapy: Secondary | ICD-10-CM | POA: Insufficient documentation

## 2023-08-13 DIAGNOSIS — C5701 Malignant neoplasm of right fallopian tube: Secondary | ICD-10-CM | POA: Insufficient documentation

## 2023-08-13 DIAGNOSIS — Z01810 Encounter for preprocedural cardiovascular examination: Secondary | ICD-10-CM | POA: Diagnosis present

## 2023-08-13 DIAGNOSIS — I129 Hypertensive chronic kidney disease with stage 1 through stage 4 chronic kidney disease, or unspecified chronic kidney disease: Secondary | ICD-10-CM | POA: Diagnosis not present

## 2023-08-13 DIAGNOSIS — I10 Essential (primary) hypertension: Secondary | ICD-10-CM

## 2023-08-13 DIAGNOSIS — N189 Chronic kidney disease, unspecified: Secondary | ICD-10-CM | POA: Insufficient documentation

## 2023-08-13 HISTORY — DX: Chronic kidney disease, unspecified: N18.9

## 2023-08-13 HISTORY — DX: Myoneural disorder, unspecified: G70.9

## 2023-08-13 HISTORY — DX: Personal history of urinary calculi: Z87.442

## 2023-08-13 LAB — CBC
HCT: 37.1 % (ref 36.0–46.0)
Hemoglobin: 12.1 g/dL (ref 12.0–15.0)
MCH: 33.7 pg (ref 26.0–34.0)
MCHC: 32.6 g/dL (ref 30.0–36.0)
MCV: 103.3 fL — ABNORMAL HIGH (ref 80.0–100.0)
Platelets: 245 10*3/uL (ref 150–400)
RBC: 3.59 MIL/uL — ABNORMAL LOW (ref 3.87–5.11)
RDW: 12.8 % (ref 11.5–15.5)
WBC: 4.8 10*3/uL (ref 4.0–10.5)
nRBC: 0 % (ref 0.0–0.2)

## 2023-08-13 LAB — BASIC METABOLIC PANEL WITH GFR
Anion gap: 9 (ref 5–15)
BUN: 20 mg/dL (ref 8–23)
CO2: 22 mmol/L (ref 22–32)
Calcium: 9.1 mg/dL (ref 8.9–10.3)
Chloride: 107 mmol/L (ref 98–111)
Creatinine, Ser: 1.07 mg/dL — ABNORMAL HIGH (ref 0.44–1.00)
GFR, Estimated: 57 mL/min — ABNORMAL LOW (ref >60)
Glucose, Bld: 98 mg/dL (ref 70–99)
Potassium: 5 mmol/L (ref 3.5–5.1)
Sodium: 138 mmol/L (ref 135–145)

## 2023-08-14 ENCOUNTER — Ambulatory Visit

## 2023-08-14 ENCOUNTER — Encounter: Payer: Self-pay | Admitting: Cardiology

## 2023-08-14 ENCOUNTER — Ambulatory Visit: Attending: Cardiology | Admitting: Cardiology

## 2023-08-14 ENCOUNTER — Telehealth: Payer: Self-pay | Admitting: Cardiology

## 2023-08-14 VITALS — BP 136/70 | HR 62 | Ht 65.0 in | Wt 148.2 lb

## 2023-08-14 DIAGNOSIS — G620 Drug-induced polyneuropathy: Secondary | ICD-10-CM | POA: Diagnosis present

## 2023-08-14 DIAGNOSIS — I10 Essential (primary) hypertension: Secondary | ICD-10-CM | POA: Insufficient documentation

## 2023-08-14 DIAGNOSIS — I447 Left bundle-branch block, unspecified: Secondary | ICD-10-CM

## 2023-08-14 DIAGNOSIS — T451X5A Adverse effect of antineoplastic and immunosuppressive drugs, initial encounter: Secondary | ICD-10-CM | POA: Insufficient documentation

## 2023-08-14 LAB — ECHOCARDIOGRAM COMPLETE
Area-P 1/2: 3.87 cm2
Height: 65 in
S' Lateral: 2.8 cm
Weight: 2371.2 [oz_av]

## 2023-08-14 NOTE — Progress Notes (Signed)
 Holly [name]

## 2023-08-14 NOTE — Progress Notes (Signed)
 Cardiology Consultation:    Date:  08/14/2023   ID:  Holly [name], DOB 13-May-1955, MRN [id number]  PCP:  Glena Landau, MD  Cardiologist:  Ralene Burger, MD   Referring MD: Glena Landau, MD   Chief Complaint  Patient presents with   Clearance 08/19/2023    History of Present Illness:    Holly [name] is a 68 y.o. female who is being seen today for the evaluation of cardiovascular evaluation before urological procedure that would be done General [facility] at the request of Glena Landau, MD. 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  She was found to have kidney stone and she is supposed to have cystoscopy ureteroscopy with potential stone evacuation and stent placement after that she is supposed to have chemotherapy again.  She was referred to us  because as part of preop evaluation she got EKG done which showed left bundle branch block that was not present before.  She is doing overall very good she is very active she goes to gym on the regular basis she stay on the treadmill for up to 1 hour she denies having any chest pain tightness squeezing pressure burning chest no shortness of breath.  No dizziness no passing out overall no cardiac complaints.  She never smoked does have family history but not premature coronary disease.  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   Ovarian cancer (HCC) dx'd 12/2016   recurrent dz 02/2019/2023   PONV (postoperative nausea and vomiting)     Past Surgical History:  Procedure Laterality Date   ABDOMINAL  HYSTERECTOMY  2018   DEBULKING N/A 12/26/2016   Procedure: RADICAL TUMOR DEBULKING; OMENTECTOMY;  Surgeon: Alphonso Aschoff, MD;  Location: [facility];  Service: Gynecology;  Laterality: N/A;   ganglion cyst removed     HIATAL HERNIA REPAIR     IR FLUORO GUIDE PORT INSERTION RIGHT  01/16/2017   IR IMAGING GUIDED PORT INSERTION  03/16/2019   IR REMOVAL TUN ACCESS W/ PORT W/O FL MOD SED  06/10/2017   IR US  GUIDE VASC ACCESS RIGHT  01/16/2017   LAPAROTOMY N/A 12/26/2016   Procedure: EXPLORATORY LAPAROTOMY;  Surgeon: Alphonso Aschoff, MD;  Location: [facility];  Service: Gynecology;  Laterality: N/A;   left morton's neuroma surgery      Nissan Fundoplication      Current Medications: Current Meds  Medication Sig   ALAWAY 0.035 % ophthalmic solution Place 1 drop into both eyes 2 (two) times daily as needed (for irritation).   atenolol  (TENORMIN ) 25 MG tablet TAKE 1 TABLET (25 MG TOTAL) BY MOUTH DAILY.   celecoxib (CELEBREX) 200 MG capsule Take 200 mg by mouth daily as needed for mild pain (pain score 1-3) or moderate pain (pain score 4-6).   cloNIDine  (CATAPRES ) 0.1 MG tablet Take 0.1 mg by mouth daily as needed (when BP elevated).   docusate sodium  (COLACE) 100 MG capsule Take 100 mg by mouth daily as needed for mild constipation.  hydrochlorothiazide  (HYDRODIURIL ) 25 MG tablet Take 25 mg by mouth daily as needed (swelling).   lidocaine -prilocaine  (EMLA ) cream Apply to affected area once daily as directed. (Patient taking differently: Apply 1 application  topically daily as needed (as directed- to access port).)   lisinopril  (ZESTRIL ) 20 MG tablet Take 20 mg by mouth daily.   MIRALAX  17 GM/SCOOP powder Take 17 g by mouth daily as needed for mild constipation.   ondansetron  (ZOFRAN ) 8 MG tablet Take 1 tablet (8 mg total) by mouth every 8 (eight) hours as needed for nausea or vomiting. Start on the third day after chemotherapy.   prochlorperazine  (COMPAZINE ) 10 MG tablet Take 1 tablet (10 mg total) by mouth  every 6 (six) hours as needed for nausea or vomiting.   RESTASIS 0.05 % ophthalmic emulsion Place 1 drop into both eyes 2 (two) times daily as needed (dry eyes).   sodium chloride  (OCEAN) 0.65 % SOLN nasal spray Place 1 spray into both nostrils as needed for congestion.   traMADol  (ULTRAM ) 50 MG tablet Take 50 mg by mouth every 4 (four) hours as needed for moderate pain (pain score 4-6) or severe pain (pain score 7-10).   triamcinolone  cream (KENALOG ) 0.1 % Apply 1 Application topically 2 (two) times daily as needed (irritation/itch).   TYLENOL  8 HOUR ARTHRITIS PAIN 650 MG CR tablet Take 650-1,300 mg by mouth every 8 (eight) hours as needed for pain.   venlafaxine  XR (EFFEXOR -XR) 37.5 MG 24 hr capsule TAKE 1 CAPSULE BY MOUTH DAILY WITH BREAKFAST.   [DISCONTINUED] doxazosin  (CARDURA ) 4 MG tablet Take 4 mg by mouth 2 (two) times daily as needed (when needed for BP).     Allergies:   Dilaudid [hydromorphone hcl], Hydromorphone, Morphine  and codeine , and Sudafed [pseudoephedrine hcl]   Social History   Socioeconomic History   Marital status: Married    Spouse name: Joanette Moynahan   Number of children: 4   Years of education: Not on file   Highest education level: Not on file  Occupational History   Occupation: Animator  Tobacco Use   Smoking status: Never    Passive exposure: Past   Smokeless tobacco: Never  Vaping Use   Vaping status: Never Used  Substance and Sexual Activity   Alcohol use: No   Drug use: No   Sexual activity: Not on file  Other Topics Concern   Not on file  Social History Narrative   Not on file   Social Drivers of Health   Financial Resource Strain: Not on file  Food Insecurity: No Food Insecurity (06/17/2022)   Hunger Vital Sign    Worried About Running Out of Food in the Last Year: Never true    Ran Out of Food in the Last Year: Never true  Transportation Needs: No Transportation Needs (06/17/2022)   PRAPARE - Administrator, Civil Service (Medical): No     Lack of Transportation (Non-Medical): No  Physical Activity: Not on file  Stress: Not on file  Social Connections: Not on file     Family History: The patient's family history includes Bone cancer in her paternal uncle; COPD in her father; Dementia in her maternal aunt and maternal grandmother; Diabetes in her father and mother; Healthy in her son and son; Heart attack in her paternal grandfather and paternal grandmother; Heart attack (age of onset: 66) in her sister; Hypertension in her father; Stroke in her father. ROS:   Please see the history of present illness.    All  14 point review of systems negative except as described per history of present illness.  EKGs/Labs/Other Studies Reviewed:    The following studies were reviewed today:   EKG:  EKG Interpretation Date/Time:  Thursday August 14 2023 13:57:01 EDT Ventricular Rate:  60 PR Interval:  166 QRS Duration:  122 QT Interval:  456 QTC Calculation: 456 R Axis:   -58  Text Interpretation: Normal sinus rhythm Left axis deviation Left bundle branch block Abnormal ECG When compared with ECG of 13-Aug-2023 09:36, Left bundle branch block has replaced Non-specific intra-ventricular conduction block Minimal criteria for Anteroseptal infarct are no longer Present Confirmed by Ralene Burger [phone]) on 08/14/2023 2:15:03 PM    Recent Labs: 07/25/2023: ALT 8 08/13/2023: BUN 20; Creatinine, Ser 1.07; Hemoglobin 12.1; Platelets 245; Potassium 5.0; Sodium 138  Recent Lipid Panel No results found for: "CHOL", "TRIG", "HDL", "CHOLHDL", "VLDL", "LDLCALC", "LDLDIRECT"  Physical Exam:    VS:  BP 136/70 (BP Location: Left Arm, Patient Position: Sitting)   Pulse 62   Ht 5\' 5"  (1.651 m)   Wt 148 lb 3.2 oz (67.2 kg)   SpO2 91%   BMI 24.66 kg/m     Wt Readings from Last 3 Encounters:  08/14/23 148 lb 3.2 oz (67.2 kg)  08/13/23 145 lb (65.8 kg)  08/04/23 142 lb (64.4 kg)     GEN:  Well nourished, well developed in no acute  distress HEENT: Normal NECK: No JVD; No carotid bruits LYMPHATICS: No lymphadenopathy CARDIAC: RRR, no murmurs, no rubs, no gallops RESPIRATORY:  Clear to auscultation without rales, wheezing or rhonchi  ABDOMEN: Soft, non-tender, non-distended MUSCULOSKELETAL:  No edema; No deformity  SKIN: Warm and dry NEUROLOGIC:  Alert and oriented x 3 PSYCHIATRIC:  Normal affect   ASSESSMENT:    1. Essential hypertension   2. LBBB (left bundle branch block)   3. Left bundle branch block   4. Peripheral neuropathy due to chemotherapy [facility])    PLAN:    In order of problems listed above:  Left bundle branch block which is new findings.  I will ask him to have an echocardiogram done to make sure her left ventricle ejection fraction is preserved.  I did see multiple echocardiogram done in 2021 when she was taking chemotherapy and left ventricular ejection fraction was normal.  In terms of potential for coronary artery disease.  She goes to the gym on the regular basis stays on the treadmill for up to 1 hour with no symptoms I do not think we have any active disease easily 4 METS so from cardiovascular evaluation point of view if echocardiogram is normal with preserved ejection fraction at that should be fine to proceed with surgical intervention next week as scheduled. Essential hypertension, blood pressure well-controlled continue present management. Peripheral neuropathy secondary to chemotherapy.  Noted.  The reason for today's visit is evaluation before surgery I will see her after surgery to evaluate other issues including dyslipidemia.   Medication Adjustments/Labs and Tests Ordered: Current medicines are reviewed at length with the patient today.  Concerns regarding medicines are outlined above.  Orders Placed This Encounter  Procedures   EKG 12-Lead   ECHOCARDIOGRAM COMPLETE   No orders of the defined types were placed in this encounter.   Signed, Manfred Seed, MD,  [facility]. 08/14/2023 2:33 PM    [facility]

## 2023-08-14 NOTE — Patient Instructions (Signed)
 Medication Instructions:  Your physician recommends that you continue on your current medications as directed. Please refer to the Current Medication list given to you today.  *If you need a refill on your cardiac medications before your next appointment, please call your pharmacy*   Lab Work: None Ordered If you have labs (blood work) drawn today and your tests are completely normal, you will receive your results only by: MyChart Message (if you have MyChart) OR A paper copy in the mail If you have any lab test that is abnormal or we need to change your treatment, we will call you to review the results.   Testing/Procedures: Your physician has requested that you have an echocardiogram. Echocardiography is a painless test that uses sound waves to create images of your heart. It provides your doctor with information about the size and shape of your heart and how well your heart's chambers and valves are working. This procedure takes approximately one hour. There are no restrictions for this procedure. Please do NOT wear cologne, perfume, aftershave, or lotions (deodorant is allowed). Please arrive 15 minutes prior to your appointment time.  Please note: We ask at that you not bring children with you during ultrasound (echo/ vascular) testing. Due to room size and safety concerns, children are not allowed in the ultrasound rooms during exams. Our front office staff cannot provide observation of children in our lobby area while testing is being conducted. An adult accompanying a patient to their appointment will only be allowed in the ultrasound room at the discretion of the ultrasound technician under special circumstances. We apologize for any inconvenience.    Follow-Up: At [facility], you and your health needs are our priority.  As part of our continuing mission to provide you with exceptional heart care, we have created designated Provider Care Teams.  These Care Teams include your  primary Cardiologist (physician) and Advanced Practice Providers (APPs -  Physician Assistants and Nurse Practitioners) who all work together to provide you with the care you need, when you need it.  We recommend signing up for the patient portal called "MyChart".  Sign up information is provided on this After Visit Summary.  MyChart is used to connect with patients for Virtual Visits (Telemedicine).  Patients are able to view lab/test results, encounter notes, upcoming appointments, etc.  Non-urgent messages can be sent to your provider as well.   To learn more about what you can do with MyChart, go to [email or web address].    Your next appointment:   6 month(s)  The format for your next appointment:   In Person  Provider:   Gypsy Balsam, MD    Other Instructions NA

## 2023-08-14 NOTE — Telephone Encounter (Signed)
 [facility] faxing over medical clearance forms for pt once completed please fax them over to [phone]

## 2023-08-15 ENCOUNTER — Telehealth: Payer: Self-pay | Admitting: Hematology and Oncology

## 2023-08-15 ENCOUNTER — Telehealth: Payer: Self-pay

## 2023-08-15 NOTE — Telephone Encounter (Signed)
 OV notes from 4/24 routed to [facility]

## 2023-08-15 NOTE — Telephone Encounter (Signed)
 Spoke with pt regarding normal Echo results. Pt verbalized understanding and had no further questions. Routed to [facility]

## 2023-08-15 NOTE — Telephone Encounter (Signed)
 Please call pt today and let her know if she is cleared for surgery or not

## 2023-08-15 NOTE — Telephone Encounter (Signed)
 Left message on My Chart with Echo results per Dr. Vanetta Shawl note. Routed to PCP.

## 2023-08-15 NOTE — Telephone Encounter (Signed)
 Pt is calling checking status of pre-op clearance

## 2023-08-15 NOTE — Telephone Encounter (Signed)
 Spoke with patient confirming upcoming appointment

## 2023-08-17 ENCOUNTER — Other Ambulatory Visit: Payer: Self-pay

## 2023-08-17 NOTE — Telephone Encounter (Signed)
     Primary Cardiologist: None  Chart reviewed as part of pre-operative protocol coverage. Given past medical history and time since last visit, based on ACC/AHA guidelines, Holly [name] would be at acceptable risk for the planned procedure without further cardiovascular testing.   She is able to greater than 4 METS of activity.  Recent echocardiogram reassuring.  I will route this recommendation to the requesting party via Epic fax function and remove from pre-op pool.  Please call with questions.  Holly [name]. Holly Wilner NP-C     08/17/2023, 7:00 PM [facility] [address] Office [phone] Fax [phone]

## 2023-08-18 ENCOUNTER — Other Ambulatory Visit: Payer: Self-pay

## 2023-08-18 ENCOUNTER — Encounter (HOSPITAL_COMMUNITY): Payer: Self-pay

## 2023-08-18 NOTE — Progress Notes (Signed)
 Case: [id number] Date/Time: 08/19/23 1515   Procedure: CYSTOSCOPY/URETEROSCOPY/HOLMIUM LASER/STENT PLACEMENT (Right)   Anesthesia type: General   Diagnosis:      Hydronephrosis of right kidney [N13.30]     Ureteral calculus [N20.1]   Pre-op diagnosis:      Hydronephrosis of right kidney     Ureteral calculus   Location: [facility]   Surgeons: Holly [name]., MD       DISCUSSION: Holly [name] is a 68 yo female who presents to PAT prior to surgery above. PMH of left bundle branch block, hiatal hernia s/p repair, GERD, CKD, neuropathy, right fallopian tube cancer in 2018 status post surgery and completion of adjuvant chemotherapy in 2019 with recurrence and currently on chemo.  Prior anesthesia complications include PONV and prolonged emergence.  Patient is undergoing above surgery due to hydronephrosis of right kidney with unknown etiology however patient does have a chronic ureteral stone.  During preop workup EKG revealed a new left bundle branch block and she was referred to cardiology for evaluation and clearance.  Patient seen by Dr. Gordan [name] with cardiology on 08/10/2023.  It was noted that she is quite active without any cardiac symptoms.  An echo was recommended which was completed on 4/24 and showed normal EF, mild LVH, grade 1 diastolic dysfunction, no significant valvular disease.  She was cleared for surgery:  "Chart reviewed as part of pre-operative protocol coverage. Given past medical history and time since last visit, based on ACC/AHA guidelines, Holly [name] would be at acceptable risk for the planned procedure without further cardiovascular testing.    She is able to greater than 4 METS of activity.  Recent echocardiogram reassuring."  VS: BP (!) 140/74 Comment: right arm sitting  Pulse (!) 56   Temp 36.7 C (Oral)   Resp 16   Ht 5\' 5"  (1.651 m)   Wt 65.8 kg   SpO2 99%   BMI 24.13 kg/m   PROVIDERS: Holly Landau, MD   LABS: Labs  reviewed: Acceptable for surgery. (all labs ordered are listed, but only abnormal results are displayed)  Labs Reviewed  BASIC METABOLIC PANEL WITH GFR - Abnormal; Notable for the following components:      Result Value   Creatinine, Ser 1.07 (*)    GFR, Estimated 57 (*)    All other components within normal limits  CBC - Abnormal; Notable for the following components:   RBC 3.59 (*)    MCV 103.3 (*)    All other components within normal limits     IMAGES: CT chest/abdomen/pelvis/4/25:  IMPRESSION: 1. Increased size of soft tissue nodules anterior to the liver, concerning for worsening metastatic disease. 2. Similar size of gastrohepatic ligament and retroperitoneal lymph nodes. 3. Stable cystic and solid pulmonary nodules. 4. Right hydroureteronephrosis to the level of a 4 mm stone in the distal ureter just below the pelvic inlet. 5. Aortic atherosclerosis.  EKG 08/10/2023:  Normal sinus rhythm, rate 60 Left axis deviation Left bundle branch block  CV:  Echo 08/14/2023: IMPRESSIONS    1. Left ventricular ejection fraction, by estimation, is 55 to 60%. The left ventricle has normal function. The left ventricle has no regional wall motion abnormalities. There is mild asymmetric left ventricular hypertrophy of the basal-septal segment. Left ventricular diastolic parameters are consistent with Grade I diastolic dysfunction (impaired relaxation). The average left ventricular global longitudinal strain is -18.9 %. The global longitudinal strain is normal.  2. Right ventricular systolic function is normal. The  right ventricular size is normal. There is normal pulmonary artery systolic pressure.  3. The mitral valve is normal in structure. No evidence of mitral valve regurgitation. No evidence of mitral stenosis.  4. The aortic valve is normal in structure. Aortic valve regurgitation is not visualized. No aortic stenosis is present.  5. The inferior vena cava is normal  in size with greater than 50% respiratory variability, suggesting right atrial pressure of 3 mmHg.  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   Ovarian cancer (HCC) dx'd 12/2016   recurrent dz 02/2019/2023   PONV (postoperative nausea and vomiting)     Past Surgical History:  Procedure Laterality Date   ABDOMINAL HYSTERECTOMY  2018   DEBULKING N/A 12/26/2016   Procedure: RADICAL TUMOR DEBULKING; OMENTECTOMY;  Surgeon: Alphonso Aschoff, MD;  Location: [facility];  Service: Gynecology;  Laterality: N/A;   ganglion cyst removed     HIATAL HERNIA REPAIR     IR FLUORO GUIDE PORT INSERTION RIGHT  01/16/2017   IR IMAGING GUIDED PORT INSERTION  03/16/2019   IR REMOVAL TUN ACCESS W/ PORT W/O FL MOD SED  06/10/2017   IR US  GUIDE VASC ACCESS RIGHT  01/16/2017   LAPAROTOMY N/A 12/26/2016   Procedure: EXPLORATORY LAPAROTOMY;  Surgeon: Alphonso Aschoff, MD;  Location: [facility];  Service: Gynecology;  Laterality: N/A;   left morton's neuroma surgery      Nissan Fundoplication      MEDICATIONS:  ALAWAY 0.035 % ophthalmic solution   atenolol  (TENORMIN ) 25 MG tablet   celecoxib (CELEBREX) 200 MG capsule   cloNIDine  (CATAPRES ) 0.1 MG tablet   docusate sodium  (COLACE) 100 MG capsule   hydrochlorothiazide  (HYDRODIURIL ) 25 MG tablet   lidocaine -prilocaine  (EMLA ) cream   lisinopril  (ZESTRIL ) 20 MG tablet   MIRALAX  17 GM/SCOOP powder   ondansetron  (ZOFRAN ) 8 MG tablet   prochlorperazine  (COMPAZINE ) 10 MG tablet   RESTASIS 0.05 % ophthalmic emulsion   sodium chloride  (OCEAN) 0.65 % SOLN nasal spray   traMADol  (ULTRAM ) 50 MG tablet   triamcinolone  cream (KENALOG ) 0.1 %   TYLENOL  8 HOUR ARTHRITIS PAIN 650 MG CR  tablet   venlafaxine  XR (EFFEXOR -XR) 37.5 MG 24 hr capsule   No current facility-administered medications for this encounter.   Holly [name] [facility] Short Stay/Anesthesiology [facility] Phone [phone] 08/18/2023 10:13 AM

## 2023-08-19 ENCOUNTER — Encounter (HOSPITAL_COMMUNITY): Payer: Self-pay | Admitting: Urology

## 2023-08-19 ENCOUNTER — Ambulatory Visit (HOSPITAL_COMMUNITY): Payer: Self-pay | Admitting: Physician Assistant

## 2023-08-19 ENCOUNTER — Ambulatory Visit (HOSPITAL_COMMUNITY)

## 2023-08-19 ENCOUNTER — Ambulatory Visit (HOSPITAL_BASED_OUTPATIENT_CLINIC_OR_DEPARTMENT_OTHER): Admitting: Anesthesiology

## 2023-08-19 ENCOUNTER — Ambulatory Visit (HOSPITAL_COMMUNITY)
Admission: RE | Admit: 2023-08-19 | Discharge: 2023-08-19 | Disposition: A | Discharge status: Home / Self Care | Attending: Urology | Admitting: Urology

## 2023-08-19 ENCOUNTER — Encounter (HOSPITAL_COMMUNITY)
Admission: RE | Disposition: A | Discharge status: Home / Self Care | Payer: Self-pay | Source: Home / Self Care | Attending: Urology

## 2023-08-19 DIAGNOSIS — N132 Hydronephrosis with renal and ureteral calculous obstruction: Secondary | ICD-10-CM

## 2023-08-19 DIAGNOSIS — I10 Essential (primary) hypertension: Secondary | ICD-10-CM | POA: Insufficient documentation

## 2023-08-19 DIAGNOSIS — N1330 Unspecified hydronephrosis: Secondary | ICD-10-CM

## 2023-08-19 DIAGNOSIS — N131 Hydronephrosis with ureteral stricture, not elsewhere classified: Secondary | ICD-10-CM | POA: Diagnosis not present

## 2023-08-19 DIAGNOSIS — R8290 Unspecified abnormal findings in urine: Secondary | ICD-10-CM | POA: Diagnosis not present

## 2023-08-19 DIAGNOSIS — Z79899 Other long term (current) drug therapy: Secondary | ICD-10-CM | POA: Diagnosis not present

## 2023-08-19 HISTORY — PX: CYSTOSCOPY/URETEROSCOPY/HOLMIUM LASER/STENT PLACEMENT: SHX6546

## 2023-08-19 SURGERY — CYSTOSCOPY/URETEROSCOPY/HOLMIUM LASER/STENT PLACEMENT
Anesthesia: General | Laterality: Right

## 2023-08-19 MED ORDER — ONDANSETRON HCL 4 MG/2ML IJ SOLN
INTRAMUSCULAR | Status: AC
  Filled 2023-08-19: qty 2

## 2023-08-19 MED ORDER — CEFAZOLIN SODIUM-DEXTROSE 2-4 GM/100ML-% IV SOLN
2.0000 g | INTRAVENOUS | Status: AC
  Administered 2023-08-19: 2 g via INTRAVENOUS
  Filled 2023-08-19: qty 100

## 2023-08-19 MED ORDER — AMISULPRIDE (ANTIEMETIC) 5 MG/2ML IV SOLN: 10.0000 mg | Freq: Once | INTRAVENOUS | Status: DC | PRN

## 2023-08-19 MED ORDER — DEXAMETHASONE SODIUM PHOSPHATE 10 MG/ML IJ SOLN
INTRAMUSCULAR | Status: DC | PRN
  Administered 2023-08-19: 10 mg via INTRAVENOUS

## 2023-08-19 MED ORDER — ONDANSETRON HCL 4 MG/2ML IJ SOLN
INTRAMUSCULAR | Status: DC | PRN
  Administered 2023-08-19: 4 mg via INTRAVENOUS

## 2023-08-19 MED ORDER — ACETAMINOPHEN 10 MG/ML IV SOLN
1000.0000 mg | Freq: Once | INTRAVENOUS | Status: DC | PRN
  Administered 2023-08-19: 1000 mg via INTRAVENOUS

## 2023-08-19 MED ORDER — LACTATED RINGERS IV SOLN: INTRAVENOUS | Status: DC

## 2023-08-19 MED ORDER — PHENAZOPYRIDINE HCL 200 MG PO TABS: 200.0000 mg | ORAL_TABLET | Freq: Three times a day (TID) | ORAL | 1 refills | Status: DC | PRN

## 2023-08-19 MED ORDER — PROPOFOL 10 MG/ML IV BOLUS
INTRAVENOUS | Status: DC | PRN
Start: 2023-08-19 — End: 2023-08-19
  Administered 2023-08-19: 200 mg via INTRAVENOUS
  Administered 2023-08-19: 20 mg via INTRAVENOUS

## 2023-08-19 MED ORDER — PROPOFOL 10 MG/ML IV BOLUS
INTRAVENOUS | Status: AC
  Filled 2023-08-19: qty 20

## 2023-08-19 MED ORDER — ONDANSETRON HCL 4 MG/2ML IJ SOLN: 4.0000 mg | Freq: Once | INTRAMUSCULAR | Status: DC | PRN

## 2023-08-19 MED ORDER — MIDAZOLAM HCL 5 MG/5ML IJ SOLN
INTRAMUSCULAR | Status: DC | PRN
Start: 2023-08-19 — End: 2023-08-19
  Administered 2023-08-19: 2 mg via INTRAVENOUS

## 2023-08-19 MED ORDER — PHENYLEPHRINE 80 MCG/ML (10ML) SYRINGE FOR IV PUSH (FOR BLOOD PRESSURE SUPPORT)
PREFILLED_SYRINGE | INTRAVENOUS | Status: AC
  Filled 2023-08-19: qty 10

## 2023-08-19 MED ORDER — LIDOCAINE HCL URETHRAL/MUCOSAL 2 % EX GEL
CUTANEOUS | Status: AC
  Filled 2023-08-19: qty 30

## 2023-08-19 MED ORDER — IOHEXOL 300 MG/ML  SOLN
INTRAMUSCULAR | Status: DC | PRN
  Administered 2023-08-19: 40 mL

## 2023-08-19 MED ORDER — CEPHALEXIN 500 MG PO CAPS: 500.0000 mg | ORAL_CAPSULE | Freq: Every day | ORAL | 0 refills | Status: DC

## 2023-08-19 MED ORDER — DEXAMETHASONE SODIUM PHOSPHATE 10 MG/ML IJ SOLN
INTRAMUSCULAR | Status: AC
  Filled 2023-08-19: qty 1

## 2023-08-19 MED ORDER — LIDOCAINE HCL (CARDIAC) PF 100 MG/5ML IV SOSY
PREFILLED_SYRINGE | INTRAVENOUS | Status: DC | PRN
  Administered 2023-08-19: 60 mg via INTRATRACHEAL

## 2023-08-19 MED ORDER — LIDOCAINE HCL 2 % EX GEL
CUTANEOUS | Status: DC | PRN
  Administered 2023-08-19: 1 via URETHRAL

## 2023-08-19 MED ORDER — FENTANYL CITRATE (PF) 100 MCG/2ML IJ SOLN
INTRAMUSCULAR | Status: AC
Start: 2023-08-19 — End: ?
  Filled 2023-08-19: qty 2

## 2023-08-19 MED ORDER — ACETAMINOPHEN 10 MG/ML IV SOLN
INTRAVENOUS | Status: AC
  Filled 2023-08-19: qty 100

## 2023-08-19 MED ORDER — MIDAZOLAM HCL 2 MG/2ML IJ SOLN
INTRAMUSCULAR | Status: AC
  Filled 2023-08-19: qty 2

## 2023-08-19 MED ORDER — OXYBUTYNIN CHLORIDE 5 MG PO TABS: 5.0000 mg | ORAL_TABLET | Freq: Three times a day (TID) | ORAL | 0 refills | Status: DC | PRN

## 2023-08-19 MED ORDER — LIDOCAINE HCL (PF) 2 % IJ SOLN
INTRAMUSCULAR | Status: AC
  Filled 2023-08-19: qty 5

## 2023-08-19 MED ORDER — FENTANYL CITRATE (PF) 250 MCG/5ML IJ SOLN
INTRAMUSCULAR | Status: DC | PRN
  Administered 2023-08-19: 100 ug via INTRAVENOUS

## 2023-08-19 MED ORDER — ORAL CARE MOUTH RINSE: 15.0000 mL | Freq: Once | OROMUCOSAL | Status: AC

## 2023-08-19 MED ORDER — FENTANYL CITRATE PF 50 MCG/ML IJ SOSY: 25.0000 ug | PREFILLED_SYRINGE | INTRAMUSCULAR | Status: DC | PRN

## 2023-08-19 MED ORDER — PROPOFOL 1000 MG/100ML IV EMUL
INTRAVENOUS | Status: AC
  Filled 2023-08-19: qty 100

## 2023-08-19 MED ORDER — CHLORHEXIDINE GLUCONATE 0.12 % MT SOLN
15.0000 mL | Freq: Once | OROMUCOSAL | Status: AC
  Administered 2023-08-19: 15 mL via OROMUCOSAL

## 2023-08-19 MED ORDER — SODIUM CHLORIDE 0.9 % IR SOLN
Status: DC | PRN
  Administered 2023-08-19: 3000 mL via INTRAVESICAL

## 2023-08-19 SURGICAL SUPPLY — 19 items
BAG URO CATCHER STRL LF (MISCELLANEOUS) ×2 IMPLANT
BASKET ZERO TIP NITINOL 2.4FR (BASKET) IMPLANT
CATH URETL OPEN 5X70 (CATHETERS) IMPLANT
CLOTH BEACON ORANGE TIMEOUT ST (SAFETY) ×2 IMPLANT
GLOVE SURG LX STRL 8.0 MICRO (GLOVE) ×2 IMPLANT
GOWN SPEC L4 XLG W/TWL (GOWN DISPOSABLE) ×2 IMPLANT
GUIDEWIRE STR DUAL SENSOR (WIRE) ×2 IMPLANT
GUIDEWIRE ZIPWRE .038 STRAIGHT (WIRE) IMPLANT
KIT BALLN UROMAX 15FX4 (MISCELLANEOUS) IMPLANT
KIT TURNOVER KIT A (KITS) IMPLANT
LASER FIB FLEXIVA PULSE ID 365 (Laser) IMPLANT
MANIFOLD NEPTUNE II (INSTRUMENTS) ×2 IMPLANT
PACK CYSTO (CUSTOM PROCEDURE TRAY) ×2 IMPLANT
SHEATH NAVIGATOR HD 12/14X36 (SHEATH) IMPLANT
SHEATH URETERAL FLEX 12X35 (SHEATH) IMPLANT
STENT CONTOUR 7FRX24X.038 (STENTS) IMPLANT
TRACTIP FLEXIVA PULS ID 200XHI (Laser) IMPLANT
TUBING CONNECTING 10 (TUBING) ×2 IMPLANT
TUBING UROLOGY SET (TUBING) ×2 IMPLANT

## 2023-08-19 NOTE — Transfer of Care (Signed)
 Immediate Anesthesia Transfer of Care Note  Patient: Holly [name]  Procedure(s) Performed: CYSTOSCOPY/URETEROSCOPY/DILATION OF URETERAL STRICTURE/STENT PLACEMENT (Right)  Patient Location: PACU  Anesthesia Type:General  Level of Consciousness: awake  Airway & Oxygen Therapy: Patient Spontanous Breathing  Post-op Assessment: Report given to RN  Post vital signs: Reviewed and stable  Last Vitals:  Vitals Value Taken Time  BP 168/74 08/19/23 1616  Temp    Pulse 60 08/19/23 1618  Resp 15 08/19/23 1618  SpO2 100 % 08/19/23 1618  Vitals shown include unfiled device data.  Last Pain:  Vitals:   08/19/23 1340  TempSrc:   PainSc: 0-No pain         Complications: No notable events documented.

## 2023-08-19 NOTE — Op Note (Signed)
 OPERATIVE NOTE   Patient Name: Holly [name]  MRN: [id number]   Date of Procedure: 08/19/23    Preoperative diagnosis:  Right hydronephrosis Possible right ureteral calculus  Postoperative diagnosis:  Right hydronephrosis Right distal ureteral stricture  Procedure:  Cystoscopy Right retrograde pyelogram with intraoperative interpretation Right ureteroscopy Balloon dilation of right ureteral stricture (50F x 4 cmm balloon) Insertion of right ureteral stent (83F x 24 cm, no tether)  Attending: Mellie Sprinkle, MD  Anesthesia: General  Estimated blood loss: Minimal  Fluids: Per anesthesia record  Drains: 83F x 24 cm right ureteral stent, tether removed  Specimens: None  Antibiotics: Ancef  2 gm IV  Findings:  - normal urethra and bladder - <1 cm narrowing in distal right ureter without mucosal changes - Dilation of right ureter proximally extending to the renal pelvis and calyces  Indications:  68 year old female recently seen for evaluation of right hydronephrosis and possible right ureteral calculus.  She underwent CT imaging on 07/25/2023 for follow-up of her ovarian carcinoma and was found to have right hydronephrosis to the distal ureter where a 4 mm calcification was noted.  No other stones were seen.  The patient did not have a prior history of nephrolithiasis.  She was not having any significant flank pain.  No dysuria or gross hematuria.  No recent UTIs.  Review of prior CT scans showed the 4 mm calcification without evidence of hydronephrosis.  It was unclear if this calcification was actually within the ureter.  She presents now for cystoscopy, right retrograde pyelogram, possible ureteroscopy, possible laser lithotripsy of ureteral calculus, and insertion of right ureteral stent.  Risk and benefits of the procedure were discussed in detail.  She understands wishes to proceed as described.  Description of Procedure:  The patient was taken to the operating room  suite and properly identified.  The patient received IV Ancef  preoperatively.  After successful induction of a general anesthetic, she was placed in the dorsal lithotomy position.  The patient's genital area was prepped and draped in sterile fashion.  A preoperative timeout was performed.  Under direct visualization a 21 French rigid cystoscope was passed through the urethra and into the bladder.  The bladder was completely inspected.  No mucosal lesions or foreign bodies were seen.  A normal-appearing trigone was noted with a single ureteral orifice bilaterally.  Scout film showed a nonspecific bowel gas pattern and no obvious bony abnormalities.  A 4 mm calcification was seen in the right pelvis.  A 5 French open-ended catheter was placed into the distal right ureter.  Injection of contrast confirmed that the calcification was not within the ureteral lumen but adjacent to the ureter.  There was a <1 cm area of narrowing noted in the distal right ureter with proximal dilation.  I was able to pass the open-ended catheter through this area and into the mid ureter.  Further injection of contrast showed dilation of the proximal ureter extending to the dilated right renal pelvis and calyces.  No other filling defects were appreciated.  A sensor guidewire was passed through the open-ended catheter and curled in the right renal pelvis.  Ureteroscopy was then performed alongside the guidewire.  I was able to negotiate the ureteroscope into the distal ureter without difficulty.  No mucosal lesions were seen.  The area of narrowing seen on the retrograde pyelogram was visualized.  The mucosa in this area appeared to be normal.  I was able to carefully pass the ureteroscope through this area  and into the ureter proximal to the narrowing.  Again the ureteral mucosa appeared normal.  No stone was identified.  Given the findings, I elected to perform a balloon dilation of this area.  A 15 French x 4 cm ureteral dilating  balloon was used.  Following dilation I was able to easily pass the ureteroscope through the  right distal ureter.  There was no evidence of any ureteral injury.  At this point a 7 F x 24 cm double-J stent was passed over the guidewire and into the right renal pelvis.  The tether was removed prior to stent placement.  Position was confirmed with fluoroscopy.  The bladder was then drained and the cystoscope was removed.  Intraurethral lidocaine  jelly was placed.  The patient was then extubated and taken to the post anesthesia care unit in stable condition.  Complications: None  Condition: Stable, extubated, transferred to PACU  Plan:  Discharge to home Return to office in 1 month for stent removal.

## 2023-08-19 NOTE — Anesthesia Postprocedure Evaluation (Signed)
 Anesthesia Post Note  Patient: Holly [name]  Procedure(s) Performed: CYSTOSCOPY/URETEROSCOPY/DILATION OF URETERAL STRICTURE/STENT PLACEMENT (Right)     Patient location during evaluation: PACU Anesthesia Type: General Level of consciousness: awake and alert, oriented and patient cooperative Pain management: pain level controlled Vital Signs Assessment: post-procedure vital signs reviewed and stable Respiratory status: spontaneous breathing, nonlabored ventilation and respiratory function stable Cardiovascular status: blood pressure returned to baseline and stable Postop Assessment: no apparent nausea or vomiting Anesthetic complications: no   No notable events documented.  Last Vitals:  Vitals:   08/19/23 1645 08/19/23 1700  BP: (!) 161/67 (!) 168/73  Pulse: (!) 59 (!) 58  Resp: 13 12  Temp:  36.4 C  SpO2: 99% 99%    Last Pain:  Vitals:   08/19/23 1655  TempSrc:   PainSc: 2                  Jacquelyne Matte

## 2023-08-19 NOTE — Anesthesia Procedure Notes (Signed)
 Procedure Name: LMA Insertion Date/Time: 08/19/2023 3:33 PM  Performed by: Hershall Lory, CRNAPre-anesthesia Checklist: Patient identified, Emergency Drugs available, Suction available and Patient being monitored Patient Re-evaluated:Patient Re-evaluated prior to induction Oxygen Delivery Method: Circle System Utilized Preoxygenation: Pre-oxygenation with 100% oxygen Induction Type: IV induction Ventilation: Mask ventilation without difficulty LMA: LMA inserted LMA Size: 4.0 Number of attempts: 1 Airway Equipment and Method: Bite block Placement Confirmation: positive ETCO2 Tube secured with: Tape Dental Injury: Teeth and Oropharynx as per pre-operative assessment

## 2023-08-19 NOTE — Interval H&P Note (Signed)
 History and Physical Interval Note:  08/19/2023 2:57 PM  Holly [name]  has presented today for surgery, with the diagnosis of Hydronephrosis of right kidney Ureteral calculus.  The various methods of treatment have been discussed with the patient and family. After consideration of risks, benefits and other options for treatment, the patient has consented to  Procedure(s): CYSTOSCOPY/URETEROSCOPY/HOLMIUM LASER/STENT PLACEMENT (Right) as a surgical intervention.  The patient's history has been reviewed, patient examined, no change in status, stable for surgery.  I have reviewed the patient's chart and labs.  Questions were answered to the patient's satisfaction.     Oda Bence

## 2023-08-19 NOTE — Anesthesia Preprocedure Evaluation (Addendum)
 Anesthesia Evaluation  Patient identified by MRN, date of birth, ID band Patient awake    Reviewed: Allergy & Precautions, NPO status , Patient's Chart, lab work & pertinent test results  History of Anesthesia Complications (+) PONV and history of anesthetic complications  Airway Mallampati: II  TM Distance: >3 FB Neck ROM: Full    Dental  (+) Partial Lower, Partial Upper   Pulmonary neg pulmonary ROS   Pulmonary exam normal        Cardiovascular hypertension, Pt. on home beta blockers and Pt. on medications Normal cardiovascular exam     Neuro/Psych  Neuromuscular disease  negative psych ROS   GI/Hepatic Neg liver ROS, hiatal hernia,,,  Endo/Other  negative endocrine ROS    Renal/GU Renal disease     Musculoskeletal negative musculoskeletal ROS (+)    Abdominal   Peds  Hematology negative hematology ROS (+)   Anesthesia Other Findings Hydronephrosis of right kidney  Ureteral calculus    Reproductive/Obstetrics                             Anesthesia Physical Anesthesia Plan  ASA: 2  Anesthesia Plan: General   Post-op Pain Management:    Induction: Intravenous  PONV Risk Score and Plan: 4 or greater and Ondansetron , Dexamethasone , Propofol  infusion, Midazolam  and Treatment may vary due to age or medical condition  Airway Management Planned: LMA  Additional Equipment:   Intra-op Plan:   Post-operative Plan: Extubation in OR  Informed Consent: I have reviewed the patients History and Physical, chart, labs and discussed the procedure including the risks, benefits and alternatives for the proposed anesthesia with the patient or authorized representative who has indicated his/her understanding and acceptance.     Dental advisory given  Plan Discussed with: CRNA  Anesthesia Plan Comments:        Anesthesia Quick Evaluation

## 2023-08-20 ENCOUNTER — Encounter (HOSPITAL_COMMUNITY): Payer: Self-pay | Admitting: Urology

## 2023-08-20 NOTE — Addendum Note (Signed)
 Addendum  created 08/20/23 1613 by Jacquelyne Matte, DO   Attestation recorded in [place], Intraprocedure Attestations filed

## 2023-08-21 ENCOUNTER — Other Ambulatory Visit: Payer: Self-pay

## 2023-08-22 ENCOUNTER — Inpatient Hospital Stay (HOSPITAL_BASED_OUTPATIENT_CLINIC_OR_DEPARTMENT_OTHER): Admitting: Hematology and Oncology

## 2023-08-22 ENCOUNTER — Other Ambulatory Visit: Payer: Self-pay

## 2023-08-22 ENCOUNTER — Inpatient Hospital Stay: Attending: Hematology and Oncology

## 2023-08-22 ENCOUNTER — Encounter: Payer: Self-pay | Admitting: Hematology and Oncology

## 2023-08-22 ENCOUNTER — Inpatient Hospital Stay

## 2023-08-22 VITALS — BP 126/65 | HR 65 | Resp 18 | Ht 65.0 in | Wt 145.6 lb

## 2023-08-22 VITALS — Temp 97.6°F

## 2023-08-22 DIAGNOSIS — C5701 Malignant neoplasm of right fallopian tube: Secondary | ICD-10-CM

## 2023-08-22 DIAGNOSIS — N1330 Unspecified hydronephrosis: Secondary | ICD-10-CM

## 2023-08-22 DIAGNOSIS — Z7189 Other specified counseling: Secondary | ICD-10-CM

## 2023-08-22 DIAGNOSIS — N183 Chronic kidney disease, stage 3 unspecified: Secondary | ICD-10-CM

## 2023-08-22 DIAGNOSIS — Z5111 Encounter for antineoplastic chemotherapy: Secondary | ICD-10-CM | POA: Diagnosis present

## 2023-08-22 DIAGNOSIS — C775 Secondary and unspecified malignant neoplasm of intrapelvic lymph nodes: Secondary | ICD-10-CM | POA: Insufficient documentation

## 2023-08-22 DIAGNOSIS — C787 Secondary malignant neoplasm of liver and intrahepatic bile duct: Secondary | ICD-10-CM | POA: Insufficient documentation

## 2023-08-22 LAB — CBC WITH DIFFERENTIAL (CANCER CENTER ONLY)
Abs Immature Granulocytes: 0.07 10*3/uL (ref 0.00–0.07)
Basophils Absolute: 0.1 10*3/uL (ref 0.0–0.1)
Basophils Relative: 1 %
Eosinophils Absolute: 0.3 10*3/uL (ref 0.0–0.5)
Eosinophils Relative: 3 %
HCT: 36.2 % (ref 36.0–46.0)
Hemoglobin: 12.3 g/dL (ref 12.0–15.0)
Immature Granulocytes: 1 %
Lymphocytes Relative: 16 %
Lymphs Abs: 1.5 10*3/uL (ref 0.7–4.0)
MCH: 32.9 pg (ref 26.0–34.0)
MCHC: 34 g/dL (ref 30.0–36.0)
MCV: 96.8 fL (ref 80.0–100.0)
Monocytes Absolute: 0.8 10*3/uL (ref 0.1–1.0)
Monocytes Relative: 9 %
Neutro Abs: 6.3 10*3/uL (ref 1.7–7.7)
Neutrophils Relative %: 70 %
Platelet Count: 207 10*3/uL (ref 150–400)
RBC: 3.74 MIL/uL — ABNORMAL LOW (ref 3.87–5.11)
RDW: 12.9 % (ref 11.5–15.5)
WBC Count: 9.1 10*3/uL (ref 4.0–10.5)
nRBC: 0 % (ref 0.0–0.2)

## 2023-08-22 LAB — CMP (CANCER CENTER ONLY)
ALT: 7 U/L (ref 0–44)
AST: 16 U/L (ref 15–41)
Albumin: 3.8 g/dL (ref 3.5–5.0)
Alkaline Phosphatase: 85 U/L (ref 38–126)
Anion gap: 7 (ref 5–15)
BUN: 24 mg/dL — ABNORMAL HIGH (ref 8–23)
CO2: 26 mmol/L (ref 22–32)
Calcium: 8.7 mg/dL — ABNORMAL LOW (ref 8.9–10.3)
Chloride: 105 mmol/L (ref 98–111)
Creatinine: 1.39 mg/dL — ABNORMAL HIGH (ref 0.44–1.00)
GFR, Estimated: 41 mL/min — ABNORMAL LOW (ref >60)
Glucose, Bld: 117 mg/dL — ABNORMAL HIGH (ref 70–99)
Potassium: 3.9 mmol/L (ref 3.5–5.1)
Sodium: 138 mmol/L (ref 135–145)
Total Bilirubin: 0.5 mg/dL (ref 0.0–1.2)
Total Protein: 6.3 g/dL — ABNORMAL LOW (ref 6.5–8.1)

## 2023-08-22 MED ORDER — SODIUM CHLORIDE 0.9% FLUSH
10.0000 mL | Freq: Once | INTRAVENOUS | Status: AC
  Administered 2023-08-22: 10 mL

## 2023-08-22 MED ORDER — PALONOSETRON HCL INJECTION 0.25 MG/5ML
0.2500 mg | Freq: Once | INTRAVENOUS | Status: AC
  Administered 2023-08-22: 0.25 mg via INTRAVENOUS
  Filled 2023-08-22: qty 5

## 2023-08-22 MED ORDER — LIDOCAINE-PRILOCAINE 2.5-2.5 % EX CREA: 1.0000 | TOPICAL_CREAM | Freq: Every day | CUTANEOUS | 3 refills | Status: AC | PRN

## 2023-08-22 MED ORDER — SODIUM CHLORIDE 0.9 % IV SOLN: INTRAVENOUS | Status: DC

## 2023-08-22 MED ORDER — DEXAMETHASONE SODIUM PHOSPHATE 10 MG/ML IJ SOLN
10.0000 mg | Freq: Once | INTRAMUSCULAR | Status: AC
  Administered 2023-08-22: 10 mg via INTRAVENOUS
  Filled 2023-08-22: qty 1

## 2023-08-22 MED ORDER — HEPARIN SOD (PORK) LOCK FLUSH 100 UNIT/ML IV SOLN
500.0000 [IU] | Freq: Once | INTRAVENOUS | Status: AC | PRN
Start: 2023-08-22 — End: 2023-08-22
  Administered 2023-08-22: 500 [IU]

## 2023-08-22 MED ORDER — SODIUM CHLORIDE 0.9% FLUSH
10.0000 mL | INTRAVENOUS | Status: DC | PRN
  Administered 2023-08-22: 10 mL

## 2023-08-22 MED ORDER — FAMOTIDINE IN NACL 20-0.9 MG/50ML-% IV SOLN
20.0000 mg | Freq: Once | INTRAVENOUS | Status: AC
Start: 2023-08-22 — End: 2023-08-22
  Administered 2023-08-22: 20 mg via INTRAVENOUS
  Filled 2023-08-22: qty 50

## 2023-08-22 MED ORDER — APREPITANT 130 MG/18ML IV EMUL
130.0000 mg | Freq: Once | INTRAVENOUS | Status: AC
  Administered 2023-08-22: 130 mg via INTRAVENOUS
  Filled 2023-08-22: qty 18

## 2023-08-22 MED ORDER — CETIRIZINE HCL 10 MG/ML IV SOLN
10.0000 mg | Freq: Once | INTRAVENOUS | Status: AC
  Administered 2023-08-22: 10 mg via INTRAVENOUS
  Filled 2023-08-22: qty 1

## 2023-08-22 MED ORDER — PROCHLORPERAZINE MALEATE 10 MG PO TABS: 10.0000 mg | ORAL_TABLET | Freq: Four times a day (QID) | ORAL | 1 refills | Status: AC | PRN

## 2023-08-22 MED ORDER — GEMCITABINE HCL CHEMO INJECTION 1 GM/26.3ML
800.0000 mg/m2 | Freq: Once | INTRAVENOUS | Status: AC
  Administered 2023-08-22: 1406 mg via INTRAVENOUS
  Filled 2023-08-22: qty 36.98

## 2023-08-22 MED ORDER — ONDANSETRON HCL 8 MG PO TABS: 8.0000 mg | ORAL_TABLET | Freq: Three times a day (TID) | ORAL | 1 refills | Status: DC | PRN

## 2023-08-22 MED ORDER — SODIUM CHLORIDE 0.9 % IV SOLN
261.6000 mg | Freq: Once | INTRAVENOUS | Status: AC
  Administered 2023-08-22: 260 mg via INTRAVENOUS
  Filled 2023-08-22: qty 26

## 2023-08-22 NOTE — Progress Notes (Signed)
 [facility] OFFICE PROGRESS NOTE  Patient Care Team: Glena Landau, MD as PCP - General (Family Medicine) Holly Jacobs, MD as Consulting Physician (Hematology and Oncology)  Assessment & Plan Adenocarcinoma of right fallopian tube [facility]) The patient was diagnosed with fallopian tube cancer in 2018 status post surgery and completion of adjuvant chemotherapy in 2019 She developed recurrent disease in 2020 after presentation with high-grade small bowel obstruction treated with carboplatin , Doxil  and bevacizumab  followed by bevacizumab  maintenance She had gallbladder surgery in November 2023 and adjacent lymph node came back positive for recurrent high-grade serous carcinoma and she was treated with carboplatin , paclitaxel  and bevacizumab  She had progressed on niraparib  and bevacizumab , Neg genetics CT imaging from April show metastatic disease to the liver, regional lymph nodes and pulmonary nodules  She is in agreement to proceed with palliative chemotherapy with combination of carboplatin  and gemcitabine  We discussed side effects to be expected I recommend minimum 3 cycles of treatment before repeating imaging study in July We discussed prognosis with or without treatment    Goals of care, counseling/discussion Patient is aware she has stage IV disease and disease is incurable. Treatment offered is palliative in nature. We have discussed prognosis before, I reviewed prognosis to be expected without treatment today with the family  Stage 3 chronic kidney disease, unspecified whether stage 3a or 3b CKD (HCC) We discussed importance of risk factor modification and increase oral fluid intake Hydronephrosis of right kidney She had recent stent procedure  She has appointment with urologist end of the month  No orders of the defined types were placed in this encounter.    Holly Jacobs, MD  INTERVAL HISTORY: she returns for treatment follow-up She had recent surgery with  stent placed for hydronephrosis She had mild intermittent hematuria but stable We reviewed side effects to be expected from treatment Her husband inquired about prognosis without treatment today  PHYSICAL EXAMINATION: ECOG PERFORMANCE STATUS: 0 - Asymptomatic  Vitals:   08/22/23 1007  BP: 126/65  Pulse: 65  Resp: 18  SpO2: 100%   Filed Weights   08/22/23 1007  Weight: 145 lb 9.6 oz (66 kg)    Relevant data reviewed during this visit included CBC and CMP

## 2023-08-22 NOTE — Assessment & Plan Note (Addendum)
 The patient was diagnosed with fallopian tube cancer in 2018 status post surgery and completion of adjuvant chemotherapy in 2019 She developed recurrent disease in 2020 after presentation with high-grade small bowel obstruction treated with carboplatin , Doxil  and bevacizumab  followed by bevacizumab  maintenance She had gallbladder surgery in November 2023 and adjacent lymph node came back positive for recurrent high-grade serous carcinoma and she was treated with carboplatin , paclitaxel  and bevacizumab  She had progressed on niraparib  and bevacizumab , Neg genetics CT imaging from April show metastatic disease to the liver, regional lymph nodes and pulmonary nodules  She is in agreement to proceed with palliative chemotherapy with combination of carboplatin  and gemcitabine  We discussed side effects to be expected I recommend minimum 3 cycles of treatment before repeating imaging study in July We discussed prognosis with or without treatment

## 2023-08-22 NOTE — Assessment & Plan Note (Addendum)
 She had recent stent procedure  She has appointment with urologist end of the month

## 2023-08-22 NOTE — Assessment & Plan Note (Addendum)
 Patient is aware she has stage IV disease and disease is incurable. Treatment offered is palliative in nature. We have discussed prognosis before, I reviewed prognosis to be expected without treatment today with the family

## 2023-08-22 NOTE — Assessment & Plan Note (Addendum)
 We discussed importance of risk factor modification and increase oral fluid intake

## 2023-08-22 NOTE — Patient Instructions (Signed)
 [facility] - A DEPT OF [facility]  Discharge Instructions: Thank you for choosing [facility] to provide your oncology and hematology care.   If you have a lab appointment with the Cancer Center, please go directly to the Cancer Center and check in at the registration area.   Wear comfortable clothing and clothing appropriate for easy access to any Portacath or PICC line.   We strive to give you quality time with your provider. You may need to reschedule your appointment if you arrive late (15 or more minutes).  Arriving late affects you and other patients whose appointments are after yours.  Also, if you miss three or more appointments without notifying the office, you may be dismissed from the clinic at the provider's discretion.      For prescription refill requests, have your pharmacy contact our office and allow 72 hours for refills to be completed.    Today you received the following chemotherapy and/or immunotherapy agents Gemzar  and Carboplatin       To help prevent nausea and vomiting after your treatment, we encourage you to take your nausea medication as directed.  BELOW ARE SYMPTOMS THAT SHOULD BE REPORTED IMMEDIATELY: *FEVER GREATER THAN 100.4 F (38 C) OR HIGHER *CHILLS OR SWEATING *NAUSEA AND VOMITING THAT IS NOT CONTROLLED WITH YOUR NAUSEA MEDICATION *UNUSUAL SHORTNESS OF BREATH *UNUSUAL BRUISING OR BLEEDING *URINARY PROBLEMS (pain or burning when urinating, or frequent urination) *BOWEL PROBLEMS (unusual diarrhea, constipation, pain near the anus) TENDERNESS IN MOUTH AND THROAT WITH OR WITHOUT PRESENCE OF ULCERS (sore throat, sores in mouth, or a toothache) UNUSUAL RASH, SWELLING OR PAIN  UNUSUAL VAGINAL DISCHARGE OR ITCHING   Items with * indicate a potential emergency and should be followed up as soon as possible or go to the Emergency Department if any problems should occur.  Please show the CHEMOTHERAPY ALERT CARD or  IMMUNOTHERAPY ALERT CARD at check-in to the Emergency Department and triage nurse.  Should you have questions after your visit or need to cancel or reschedule your appointment, please contact [facility] - A DEPT OF [facility]  Dept: [phone]  and follow the prompts.  Office hours are 8:00 a.m. to 4:30 p.m. Monday - Friday. Please note that voicemails left after 4:00 p.m. may not be returned until the following business day.  We are closed weekends and major holidays. You have access to a nurse at all times for urgent questions. Please call the main number to the clinic Dept: [phone] and follow the prompts.   For any non-urgent questions, you may also contact your provider using MyChart. We now offer e-Visits for anyone 14 and older to request care online for non-urgent symptoms. For details visit [email or web address].   Also download the MyChart app! Go to the app store, search "MyChart", open the app, select [facility], and log in with your MyChart username and password.  Gemcitabine  Injection What is this medication? GEMCITABINE  (jem SYE ta been) treats some types of cancer. It works by slowing down the growth of cancer cells. This medicine may be used for other purposes; ask your health care provider or pharmacist if you have questions. COMMON BRAND NAME(S): Gemzar , Infugem  What should I tell my care team before I take this medication? They need to know if you have any of these conditions: Blood disorders Infection Kidney disease Liver disease Lung or breathing disease, such as asthma or COPD Recent  or ongoing radiation therapy An unusual or allergic reaction to gemcitabine , other medications, foods, dyes, or preservatives If you or your partner are pregnant or trying to get pregnant Breast-feeding How should I use this medication? This medication is injected into a vein. It is given by your care team in a hospital or clinic setting. Talk  to your care team about the use of this medication in children. Special care may be needed. Overdosage: If you think you have taken too much of this medicine contact a poison control center or emergency room at once. NOTE: This medicine is only for you. Do not share this medicine with others. What if I miss a dose? Keep appointments for follow-up doses. It is important not to miss your dose. Call your care team if you are unable to keep an appointment. What may interact with this medication? Interactions have not been studied. This list may not describe all possible interactions. Give your health care provider a list of all the medicines, herbs, non-prescription drugs, or dietary supplements you use. Also tell them if you smoke, drink alcohol, or use illegal drugs. Some items may interact with your medicine. What should I watch for while using this medication? Your condition will be monitored carefully while you are receiving this medication. This medication may make you feel generally unwell. This is not uncommon, as chemotherapy can affect healthy cells as well as cancer cells. Report any side effects. Continue your course of treatment even though you feel ill unless your care team tells you to stop. In some cases, you may be given additional medications to help with side effects. Follow all directions for their use. This medication may increase your risk of getting an infection. Call your care team for advice if you get a fever, chills, sore throat, or other symptoms of a cold or flu. Do not treat yourself. Try to avoid being around people who are sick. This medication may increase your risk to bruise or bleed. Call your care team if you notice any unusual bleeding. Be careful brushing or flossing your teeth or using a toothpick because you may get an infection or bleed more easily. If you have any dental work done, tell your dentist you are receiving this medication. Avoid taking medications that  contain aspirin, acetaminophen , ibuprofen , naproxen, or ketoprofen unless instructed by your care team. These medications may hide a fever. Talk to your care team if you or your partner wish to become pregnant or think you might be pregnant. This medication can cause serious birth defects if taken during pregnancy and for 6 months after the last dose. A negative pregnancy test is required before starting this medication. A reliable form of contraception is recommended while taking this medication and for 6 months after the last dose. Talk to your care team about effective forms of contraception. Do not father a child while taking this medication and for 3 months after the last dose. Use a condom while having sex during this time period. Do not breastfeed while taking this medication and for at least 1 week after the last dose. This medication may cause infertility. Talk to your care team if you are concerned about your fertility. What side effects may I notice from receiving this medication? Side effects that you should report to your care team as soon as possible: Allergic reactions--skin rash, itching, hives, swelling of the face, lips, tongue, or throat Capillary leak syndrome--stomach or muscle pain, unusual weakness or fatigue, feeling faint or lightheaded,  decrease in the amount of urine, swelling of the ankles, hands, or feet, trouble breathing Infection--fever, chills, cough, sore throat, wounds that don't heal, pain or trouble when passing urine, general feeling of discomfort or being unwell Liver injury--right upper belly pain, loss of appetite, nausea, light-colored stool, dark yellow or brown urine, yellowing skin or eyes, unusual weakness or fatigue Low red blood cell level--unusual weakness or fatigue, dizziness, headache, trouble breathing Lung injury--shortness of breath or trouble breathing, cough, spitting up blood, chest pain, fever Stomach pain, bloody diarrhea, pale skin, unusual  weakness or fatigue, decrease in the amount of urine, which may be signs of hemolytic uremic syndrome Sudden and severe headache, confusion, change in vision, seizures, which may be signs of posterior reversible encephalopathy syndrome (PRES) Unusual bruising or bleeding Side effects that usually do not require medical attention (report to your care team if they continue or are bothersome): Diarrhea Drowsiness Hair loss Nausea Pain, redness, or swelling with sores inside the mouth or throat Vomiting This list may not describe all possible side effects. Call your doctor for medical advice about side effects. You may report side effects to FDA at 1-800-FDA-1088. Where should I keep my medication? This medication is given in a hospital or clinic. It will not be stored at home. NOTE: This sheet is a summary. It may not cover all possible information. If you have questions about this medicine, talk to your doctor, pharmacist, or health care provider.  2024 Elsevier/Gold Standard (2021-08-14 00:00:00)  Carboplatin  Injection What is this medication? CARBOPLATIN  (KAR boe pla tin) treats some types of cancer. It works by slowing down the growth of cancer cells. This medicine may be used for other purposes; ask your health care provider or pharmacist if you have questions. COMMON BRAND NAME(S): Paraplatin  What should I tell my care team before I take this medication? They need to know if you have any of these conditions: Blood disorders Hearing problems Kidney disease Recent or ongoing radiation therapy An unusual or allergic reaction to carboplatin , cisplatin, other medications, foods, dyes, or preservatives Pregnant or trying to get pregnant Breast-feeding How should I use this medication? This medication is injected into a vein. It is given by your care team in a hospital or clinic setting. Talk to your care team about the use of this medication in children. Special care may be  needed. Overdosage: If you think you have taken too much of this medicine contact a poison control center or emergency room at once. NOTE: This medicine is only for you. Do not share this medicine with others. What if I miss a dose? Keep appointments for follow-up doses. It is important not to miss your dose. Call your care team if you are unable to keep an appointment. What may interact with this medication? Medications for seizures Some antibiotics, such as amikacin, gentamicin, neomycin, streptomycin, tobramycin Vaccines This list may not describe all possible interactions. Give your health care provider a list of all the medicines, herbs, non-prescription drugs, or dietary supplements you use. Also tell them if you smoke, drink alcohol, or use illegal drugs. Some items may interact with your medicine. What should I watch for while using this medication? Your condition will be monitored carefully while you are receiving this medication. You may need blood work while taking this medication. This medication may make you feel generally unwell. This is not uncommon, as chemotherapy can affect healthy cells as well as cancer cells. Report any side effects. Continue your course of treatment  even though you feel ill unless your care team tells you to stop. In some cases, you may be given additional medications to help with side effects. Follow all directions for their use. This medication may increase your risk of getting an infection. Call your care team for advice if you get a fever, chills, sore throat, or other symptoms of a cold or flu. Do not treat yourself. Try to avoid being around people who are sick. Avoid taking medications that contain aspirin, acetaminophen , ibuprofen , naproxen, or ketoprofen unless instructed by your care team. These medications may hide a fever. Be careful brushing or flossing your teeth or using a toothpick because you may get an infection or bleed more easily. If you  have any dental work done, tell your dentist you are receiving this medication. Talk to your care team if you wish to become pregnant or think you might be pregnant. This medication can cause serious birth defects. Talk to your care team about effective forms of contraception. Do not breast-feed while taking this medication. What side effects may I notice from receiving this medication? Side effects that you should report to your care team as soon as possible: Allergic reactions--skin rash, itching, hives, swelling of the face, lips, tongue, or throat Infection--fever, chills, cough, sore throat, wounds that don't heal, pain or trouble when passing urine, general feeling of discomfort or being unwell Low red blood cell level--unusual weakness or fatigue, dizziness, headache, trouble breathing Pain, tingling, or numbness in the hands or feet, muscle weakness, change in vision, confusion or trouble speaking, loss of balance or coordination, trouble walking, seizures Unusual bruising or bleeding Side effects that usually do not require medical attention (report to your care team if they continue or are bothersome): Hair loss Nausea Unusual weakness or fatigue Vomiting This list may not describe all possible side effects. Call your doctor for medical advice about side effects. You may report side effects to FDA at 1-800-FDA-1088. Where should I keep my medication? This medication is given in a hospital or clinic. It will not be stored at home. NOTE: This sheet is a summary. It may not cover all possible information. If you have questions about this medicine, talk to your doctor, pharmacist, or health care provider.  2024 Elsevier/Gold Standard (2021-07-31 00:00:00)

## 2023-08-23 LAB — CA 125: Cancer Antigen (CA) 125: 35 U/mL (ref 0.0–38.1)

## 2023-08-25 ENCOUNTER — Telehealth: Payer: Self-pay

## 2023-08-25 NOTE — Telephone Encounter (Signed)
 Holly [name] states that she is doing fine. She is eating, drinking, and urinating well. She knows to call the office at [phone] if  she has any questions or concerns.

## 2023-08-25 NOTE — Telephone Encounter (Signed)
-----   Message from Nurse Brigido Canales sent at 08/22/2023  1:21 PM EDT ----- Regarding: Dr. Marton Sleeper 1st tx f/u call Dr. Marton Sleeper 1st treat f/u call - 1st time gemzar  (20th Re[facility] [place]) tolerated well

## 2023-08-28 ENCOUNTER — Inpatient Hospital Stay

## 2023-08-28 ENCOUNTER — Telehealth: Payer: Self-pay

## 2023-08-28 ENCOUNTER — Other Ambulatory Visit: Payer: Self-pay | Admitting: Urology

## 2023-08-28 ENCOUNTER — Inpatient Hospital Stay: Admitting: Hematology and Oncology

## 2023-08-28 ENCOUNTER — Encounter: Payer: Self-pay | Admitting: Hematology and Oncology

## 2023-08-28 VITALS — BP 117/72 | HR 76 | Resp 18 | Ht 65.0 in | Wt 143.2 lb

## 2023-08-28 DIAGNOSIS — D61818 Other pancytopenia: Secondary | ICD-10-CM | POA: Diagnosis not present

## 2023-08-28 DIAGNOSIS — C5701 Malignant neoplasm of right fallopian tube: Secondary | ICD-10-CM

## 2023-08-28 DIAGNOSIS — Z5111 Encounter for antineoplastic chemotherapy: Secondary | ICD-10-CM | POA: Diagnosis not present

## 2023-08-28 DIAGNOSIS — R634 Abnormal weight loss: Secondary | ICD-10-CM | POA: Diagnosis not present

## 2023-08-28 DIAGNOSIS — N183 Chronic kidney disease, stage 3 unspecified: Secondary | ICD-10-CM

## 2023-08-28 DIAGNOSIS — N201 Calculus of ureter: Secondary | ICD-10-CM

## 2023-08-28 DIAGNOSIS — Z7189 Other specified counseling: Secondary | ICD-10-CM

## 2023-08-28 LAB — CBC WITH DIFFERENTIAL/PLATELET
Abs Immature Granulocytes: 0.03 10*3/uL (ref 0.00–0.07)
Basophils Absolute: 0 10*3/uL (ref 0.0–0.1)
Basophils Relative: 1 %
Eosinophils Absolute: 0.2 10*3/uL (ref 0.0–0.5)
Eosinophils Relative: 4 %
HCT: 32.6 % — ABNORMAL LOW (ref 36.0–46.0)
Hemoglobin: 11.4 g/dL — ABNORMAL LOW (ref 12.0–15.0)
Immature Granulocytes: 1 %
Lymphocytes Relative: 25 %
Lymphs Abs: 1 10*3/uL (ref 0.7–4.0)
MCH: 32.5 pg (ref 26.0–34.0)
MCHC: 35 g/dL (ref 30.0–36.0)
MCV: 92.9 fL (ref 80.0–100.0)
Monocytes Absolute: 0 10*3/uL — ABNORMAL LOW (ref 0.1–1.0)
Monocytes Relative: 1 %
Neutro Abs: 2.8 10*3/uL (ref 1.7–7.7)
Neutrophils Relative %: 68 %
Platelets: 129 10*3/uL — ABNORMAL LOW (ref 150–400)
RBC: 3.51 MIL/uL — ABNORMAL LOW (ref 3.87–5.11)
RDW: 12 % (ref 11.5–15.5)
WBC: 4.1 10*3/uL (ref 4.0–10.5)
nRBC: 0 % (ref 0.0–0.2)

## 2023-08-28 LAB — COMPREHENSIVE METABOLIC PANEL WITH GFR
ALT: 12 U/L (ref 0–44)
AST: 19 U/L (ref 15–41)
Albumin: 3.7 g/dL (ref 3.5–5.0)
Alkaline Phosphatase: 95 U/L (ref 38–126)
Anion gap: 6 (ref 5–15)
BUN: 26 mg/dL — ABNORMAL HIGH (ref 8–23)
CO2: 28 mmol/L (ref 22–32)
Calcium: 8.6 mg/dL — ABNORMAL LOW (ref 8.9–10.3)
Chloride: 101 mmol/L (ref 98–111)
Creatinine, Ser: 1.09 mg/dL — ABNORMAL HIGH (ref 0.44–1.00)
GFR, Estimated: 55 mL/min — ABNORMAL LOW (ref >60)
Glucose, Bld: 138 mg/dL — ABNORMAL HIGH (ref 70–99)
Potassium: 3.8 mmol/L (ref 3.5–5.1)
Sodium: 135 mmol/L (ref 135–145)
Total Bilirubin: 0.8 mg/dL (ref 0.0–1.2)
Total Protein: 6.1 g/dL — ABNORMAL LOW (ref 6.5–8.1)

## 2023-08-28 MED ORDER — SODIUM CHLORIDE 0.9% FLUSH
10.0000 mL | Freq: Once | INTRAVENOUS | Status: AC
  Administered 2023-08-28: 10 mL

## 2023-08-28 MED ORDER — OXYBUTYNIN CHLORIDE 5 MG PO TABS: 5.0000 mg | ORAL_TABLET | Freq: Three times a day (TID) | ORAL | 0 refills | Status: DC | PRN

## 2023-08-28 MED ORDER — SODIUM CHLORIDE 0.9 % IV SOLN
800.0000 mg/m2 | Freq: Once | INTRAVENOUS | Status: AC
  Administered 2023-08-28: 1406 mg via INTRAVENOUS
  Filled 2023-08-28: qty 36.98

## 2023-08-28 MED ORDER — SODIUM CHLORIDE 0.9 % IV SOLN: INTRAVENOUS | Status: DC

## 2023-08-28 MED ORDER — PROCHLORPERAZINE MALEATE 10 MG PO TABS
10.0000 mg | ORAL_TABLET | Freq: Once | ORAL | Status: AC
  Administered 2023-08-28: 10 mg via ORAL

## 2023-08-28 NOTE — Progress Notes (Signed)
 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) The patient was diagnosed with fallopian tube cancer in 2018 status post surgery and completion of adjuvant chemotherapy in 2019 She developed recurrent disease in 2020 after presentation with high-grade small bowel obstruction treated with carboplatin , Doxil  and bevacizumab  followed by bevacizumab  maintenance She had gallbladder surgery in November 2023 and adjacent lymph node came back positive for recurrent high-grade serous carcinoma and she was treated with carboplatin , paclitaxel  and bevacizumab  She had progressed on niraparib  and bevacizumab , Neg genetics CT imaging from April show metastatic disease to the liver, regional lymph nodes and pulmonary nodules  She tolerated cycle 1, day 1 of carboplatin  and gemcitabine  well She have lost some weight due to the changes in appetite and she has mild pancytopenia today We will proceed with treatment without delay I recommend minimum 3 cycles of treatment before repeating imaging study in July     Pancytopenia, acquired [facility]) She is not symptomatic from pancytopenia Observe closely for now  Weight loss Due to recent changes in appetite We discussed importance of frequent small meals Stage 3 chronic kidney disease, unspecified whether stage 3a or 3b CKD (HCC) We discussed importance of risk factor modification and increase oral fluid intake  No orders of the defined types were placed in this encounter.    Almeda Jacobs, MD  INTERVAL HISTORY: she returns for treatment follow-up Complications related to previous cycle of chemotherapy included pancytopenia, and weight loss, She denies nausea No recent bleeding  PHYSICAL EXAMINATION: ECOG PERFORMANCE STATUS: 0 -  Asymptomatic  Vitals:   08/28/23 1243  BP: 117/72  Pulse: 76  Resp: 18  SpO2: 99%   Filed Weights   08/28/23 1243  Weight: 143 lb 3.2 oz (65 kg)    Relevant data reviewed during this visit included CBC and CMP

## 2023-08-28 NOTE — Assessment & Plan Note (Addendum)
 The patient was diagnosed with fallopian tube cancer in 2018 status post surgery and completion of adjuvant chemotherapy in 2019 She developed recurrent disease in 2020 after presentation with high-grade small bowel obstruction treated with carboplatin , Doxil  and bevacizumab  followed by bevacizumab  maintenance She had gallbladder surgery in November 2023 and adjacent lymph node came back positive for recurrent high-grade serous carcinoma and she was treated with carboplatin , paclitaxel  and bevacizumab  She had progressed on niraparib  and bevacizumab , Neg genetics CT imaging from April show metastatic disease to the liver, regional lymph nodes and pulmonary nodules  She tolerated cycle 1, day 1 of carboplatin  and gemcitabine  well She have lost some weight due to the changes in appetite and she has mild pancytopenia today We will proceed with treatment without delay I recommend minimum 3 cycles of treatment before repeating imaging study in July

## 2023-08-28 NOTE — Assessment & Plan Note (Addendum)
 She is not symptomatic from pancytopenia Observe closely for now

## 2023-08-28 NOTE — Assessment & Plan Note (Addendum)
 We discussed importance of risk factor modification and increase oral fluid intake

## 2023-08-28 NOTE — Assessment & Plan Note (Addendum)
 Due to recent changes in appetite We discussed importance of frequent small meals

## 2023-08-28 NOTE — Telephone Encounter (Signed)
 Pt called c/o cramping when bladder is empty. Pt has a right side stent. Ensured pt is taking all medications given by Dr. Willye Harvey. Pt states she is. Reinforced with pt purposes of stent and common symptoms associated. Reinforced with pt to ensure she's taking something to not become constipated due to medications. Pt voiced understanding. Pt requested a refill of oxybutnin. Pt has surgery 09/18/23 and only has 8 pills left. Please advise.

## 2023-08-28 NOTE — Patient Instructions (Signed)
 [facility] - A DEPT OF [place]  Discharge Instructions: Thank you for choosing [facility] to provide your oncology and hematology care.   If you have a lab appointment with the Cancer Center, please go directly to the Cancer Center and check in at the registration area.   Wear comfortable clothing and clothing appropriate for easy access to any Portacath or PICC line.   We strive to give you quality time with your provider. You may need to reschedule your appointment if you arrive late (15 or more minutes).  Arriving late affects you and other patients whose appointments are after yours.  Also, if you miss three or more appointments without notifying the office, you may be dismissed from the clinic at the provider's discretion.      For prescription refill requests, have your pharmacy contact our office and allow 72 hours for refills to be completed.    Today you received the following chemotherapy and/or immunotherapy agents Gemzar   To help prevent nausea and vomiting after your treatment, we encourage you to take your nausea medication as directed.  BELOW ARE SYMPTOMS THAT SHOULD BE REPORTED IMMEDIATELY: *FEVER GREATER THAN 100.4 F (38 C) OR HIGHER *CHILLS OR SWEATING *NAUSEA AND VOMITING THAT IS NOT CONTROLLED WITH YOUR NAUSEA MEDICATION *UNUSUAL SHORTNESS OF BREATH *UNUSUAL BRUISING OR BLEEDING *URINARY PROBLEMS (pain or burning when urinating, or frequent urination) *BOWEL PROBLEMS (unusual diarrhea, constipation, pain near the anus) TENDERNESS IN MOUTH AND THROAT WITH OR WITHOUT PRESENCE OF ULCERS (sore throat, sores in mouth, or a toothache) UNUSUAL RASH, SWELLING OR PAIN  UNUSUAL VAGINAL DISCHARGE OR ITCHING   Items with * indicate a potential emergency and should be followed up as soon as possible or go to the Emergency Department if any problems should occur.  Please show the CHEMOTHERAPY ALERT CARD or IMMUNOTHERAPY ALERT  CARD at check-in to the Emergency Department and triage nurse.  Should you have questions after your visit or need to cancel or reschedule your appointment, please contact [facility] - A DEPT OF [facility]  Dept: [phone]  and follow the prompts.  Office hours are 8:00 a.m. to 4:30 p.m. Monday - Friday. Please note that voicemails left after 4:00 p.m. may not be returned until the following business day.  We are closed weekends and major holidays. You have access to a nurse at all times for urgent questions. Please call the main number to the clinic Dept: [phone] and follow the prompts.   For any non-urgent questions, you may also contact your provider using MyChart. We now offer e-Visits for anyone 44 and older to request care online for non-urgent symptoms. For details visit [email or web address].   Also download the MyChart app! Go to the app store, search "MyChart", open the app, select [place], and log in with your MyChart username and password.  Gemcitabine  Injection What is this medication? GEMCITABINE  (jem SYE ta been) treats some types of cancer. It works by slowing down the growth of cancer cells. This medicine may be used for other purposes; ask your health care provider or pharmacist if you have questions. COMMON BRAND NAME(S): Gemzar , Infugem  What should I tell my care team before I take this medication? They need to know if you have any of these conditions: Blood disorders Infection Kidney disease Liver disease Lung or breathing disease, such as asthma or COPD Recent or ongoing radiation therapy An unusual  or allergic reaction to gemcitabine , other medications, foods, dyes, or preservatives If you or your partner are pregnant or trying to get pregnant Breast-feeding How should I use this medication? This medication is injected into a vein. It is given by your care team in a hospital or clinic setting. Talk to your care team  about the use of this medication in children. Special care may be needed. Overdosage: If you think you have taken too much of this medicine contact a poison control center or emergency room at once. NOTE: This medicine is only for you. Do not share this medicine with others. What if I miss a dose? Keep appointments for follow-up doses. It is important not to miss your dose. Call your care team if you are unable to keep an appointment. What may interact with this medication? Interactions have not been studied. This list may not describe all possible interactions. Give your health care provider a list of all the medicines, herbs, non-prescription drugs, or dietary supplements you use. Also tell them if you smoke, drink alcohol, or use illegal drugs. Some items may interact with your medicine. What should I watch for while using this medication? Your condition will be monitored carefully while you are receiving this medication. This medication may make you feel generally unwell. This is not uncommon, as chemotherapy can affect healthy cells as well as cancer cells. Report any side effects. Continue your course of treatment even though you feel ill unless your care team tells you to stop. In some cases, you may be given additional medications to help with side effects. Follow all directions for their use. This medication may increase your risk of getting an infection. Call your care team for advice if you get a fever, chills, sore throat, or other symptoms of a cold or flu. Do not treat yourself. Try to avoid being around people who are sick. This medication may increase your risk to bruise or bleed. Call your care team if you notice any unusual bleeding. Be careful brushing or flossing your teeth or using a toothpick because you may get an infection or bleed more easily. If you have any dental work done, tell your dentist you are receiving this medication. Avoid taking medications that contain aspirin,  acetaminophen , ibuprofen , naproxen, or ketoprofen unless instructed by your care team. These medications may hide a fever. Talk to your care team if you or your partner wish to become pregnant or think you might be pregnant. This medication can cause serious birth defects if taken during pregnancy and for 6 months after the last dose. A negative pregnancy test is required before starting this medication. A reliable form of contraception is recommended while taking this medication and for 6 months after the last dose. Talk to your care team about effective forms of contraception. Do not father a child while taking this medication and for 3 months after the last dose. Use a condom while having sex during this time period. Do not breastfeed while taking this medication and for at least 1 week after the last dose. This medication may cause infertility. Talk to your care team if you are concerned about your fertility. What side effects may I notice from receiving this medication? Side effects that you should report to your care team as soon as possible: Allergic reactions--skin rash, itching, hives, swelling of the face, lips, tongue, or throat Capillary leak syndrome--stomach or muscle pain, unusual weakness or fatigue, feeling faint or lightheaded, decrease in the amount of urine,  swelling of the ankles, hands, or feet, trouble breathing Infection--fever, chills, cough, sore throat, wounds that don't heal, pain or trouble when passing urine, general feeling of discomfort or being unwell Liver injury--right upper belly pain, loss of appetite, nausea, light-colored stool, dark yellow or brown urine, yellowing skin or eyes, unusual weakness or fatigue Low red blood cell level--unusual weakness or fatigue, dizziness, headache, trouble breathing Lung injury--shortness of breath or trouble breathing, cough, spitting up blood, chest pain, fever Stomach pain, bloody diarrhea, pale skin, unusual weakness or fatigue,  decrease in the amount of urine, which may be signs of hemolytic uremic syndrome Sudden and severe headache, confusion, change in vision, seizures, which may be signs of posterior reversible encephalopathy syndrome (PRES) Unusual bruising or bleeding Side effects that usually do not require medical attention (report to your care team if they continue or are bothersome): Diarrhea Drowsiness Hair loss Nausea Pain, redness, or swelling with sores inside the mouth or throat Vomiting This list may not describe all possible side effects. Call your doctor for medical advice about side effects. You may report side effects to FDA at 1-800-FDA-1088. Where should I keep my medication? This medication is given in a hospital or clinic. It will not be stored at home. NOTE: This sheet is a summary. It may not cover all possible information. If you have questions about this medicine, talk to your doctor, pharmacist, or health care provider.  2024 Elsevier/Gold Standard (2021-08-14 00:00:00)  Carboplatin  Injection What is this medication? CARBOPLATIN  (KAR boe pla tin) treats some types of cancer. It works by slowing down the growth of cancer cells. This medicine may be used for other purposes; ask your health care provider or pharmacist if you have questions. COMMON BRAND NAME(S): Paraplatin  What should I tell my care team before I take this medication? They need to know if you have any of these conditions: Blood disorders Hearing problems Kidney disease Recent or ongoing radiation therapy An unusual or allergic reaction to carboplatin , cisplatin, other medications, foods, dyes, or preservatives Pregnant or trying to get pregnant Breast-feeding How should I use this medication? This medication is injected into a vein. It is given by your care team in a hospital or clinic setting. Talk to your care team about the use of this medication in children. Special care may be needed. Overdosage: If you think  you have taken too much of this medicine contact a poison control center or emergency room at once. NOTE: This medicine is only for you. Do not share this medicine with others. What if I miss a dose? Keep appointments for follow-up doses. It is important not to miss your dose. Call your care team if you are unable to keep an appointment. What may interact with this medication? Medications for seizures Some antibiotics, such as amikacin, gentamicin, neomycin, streptomycin, tobramycin Vaccines This list may not describe all possible interactions. Give your health care provider a list of all the medicines, herbs, non-prescription drugs, or dietary supplements you use. Also tell them if you smoke, drink alcohol, or use illegal drugs. Some items may interact with your medicine. What should I watch for while using this medication? Your condition will be monitored carefully while you are receiving this medication. You may need blood work while taking this medication. This medication may make you feel generally unwell. This is not uncommon, as chemotherapy can affect healthy cells as well as cancer cells. Report any side effects. Continue your course of treatment even though you feel ill unless  your care team tells you to stop. In some cases, you may be given additional medications to help with side effects. Follow all directions for their use. This medication may increase your risk of getting an infection. Call your care team for advice if you get a fever, chills, sore throat, or other symptoms of a cold or flu. Do not treat yourself. Try to avoid being around people who are sick. Avoid taking medications that contain aspirin, acetaminophen , ibuprofen , naproxen, or ketoprofen unless instructed by your care team. These medications may hide a fever. Be careful brushing or flossing your teeth or using a toothpick because you may get an infection or bleed more easily. If you have any dental work done, tell your  dentist you are receiving this medication. Talk to your care team if you wish to become pregnant or think you might be pregnant. This medication can cause serious birth defects. Talk to your care team about effective forms of contraception. Do not breast-feed while taking this medication. What side effects may I notice from receiving this medication? Side effects that you should report to your care team as soon as possible: Allergic reactions--skin rash, itching, hives, swelling of the face, lips, tongue, or throat Infection--fever, chills, cough, sore throat, wounds that don't heal, pain or trouble when passing urine, general feeling of discomfort or being unwell Low red blood cell level--unusual weakness or fatigue, dizziness, headache, trouble breathing Pain, tingling, or numbness in the hands or feet, muscle weakness, change in vision, confusion or trouble speaking, loss of balance or coordination, trouble walking, seizures Unusual bruising or bleeding Side effects that usually do not require medical attention (report to your care team if they continue or are bothersome): Hair loss Nausea Unusual weakness or fatigue Vomiting This list may not describe all possible side effects. Call your doctor for medical advice about side effects. You may report side effects to FDA at 1-800-FDA-1088. Where should I keep my medication? This medication is given in a hospital or clinic. It will not be stored at home. NOTE: This sheet is a summary. It may not cover all possible information. If you have questions about this medicine, talk to your doctor, pharmacist, or health care provider.  2024 Elsevier/Gold Standard (2021-07-31 00:00:00)

## 2023-08-29 ENCOUNTER — Other Ambulatory Visit: Payer: Self-pay

## 2023-09-01 ENCOUNTER — Telehealth: Payer: Self-pay | Admitting: Urology

## 2023-09-01 NOTE — Telephone Encounter (Signed)
 LMOM- refills sent to pharmacy.

## 2023-09-01 NOTE — Telephone Encounter (Signed)
 Patient wanted you to know she got her medication and wanted to make sure that is all you needed. I told her if you wanted to talk to her about anything elae you would give her a call. Thanks.

## 2023-09-03 ENCOUNTER — Telehealth: Payer: Self-pay

## 2023-09-03 NOTE — Telephone Encounter (Signed)
 Pt called stating she is still having blood when she urinates. Pt states blood is always present with urination but sometimes its pink while other times its copious and bright red. Pt denies clots, n/v, f/c, pain, or burning with urination. Pt states she only had bladder cramps when not taking oxybutynin  and pressure with urination. Reinforced with pt the purposes of stent and how its affecting her body. Pt voiced understanding and requested reassurance. Please advise.

## 2023-09-03 NOTE — Telephone Encounter (Signed)
 Spoke with pt in reference to stent. Reinforced info from Dr. Willye Harvey. Pt voiced understanding.

## 2023-09-06 ENCOUNTER — Other Ambulatory Visit: Payer: Self-pay

## 2023-09-11 ENCOUNTER — Inpatient Hospital Stay

## 2023-09-11 ENCOUNTER — Inpatient Hospital Stay (HOSPITAL_BASED_OUTPATIENT_CLINIC_OR_DEPARTMENT_OTHER): Admitting: Hematology and Oncology

## 2023-09-11 ENCOUNTER — Encounter: Payer: Self-pay | Admitting: Hematology and Oncology

## 2023-09-11 ENCOUNTER — Telehealth: Payer: Self-pay | Admitting: Urology

## 2023-09-11 VITALS — BP 127/60 | HR 56 | Temp 98.2°F | Resp 18 | Ht 65.0 in | Wt 151.0 lb

## 2023-09-11 DIAGNOSIS — Z7189 Other specified counseling: Secondary | ICD-10-CM

## 2023-09-11 DIAGNOSIS — C5701 Malignant neoplasm of right fallopian tube: Secondary | ICD-10-CM

## 2023-09-11 DIAGNOSIS — Z5111 Encounter for antineoplastic chemotherapy: Secondary | ICD-10-CM | POA: Diagnosis not present

## 2023-09-11 DIAGNOSIS — D61818 Other pancytopenia: Secondary | ICD-10-CM | POA: Diagnosis not present

## 2023-09-11 DIAGNOSIS — N183 Chronic kidney disease, stage 3 unspecified: Secondary | ICD-10-CM | POA: Diagnosis not present

## 2023-09-11 DIAGNOSIS — E8809 Other disorders of plasma-protein metabolism, not elsewhere classified: Secondary | ICD-10-CM | POA: Diagnosis not present

## 2023-09-11 LAB — CMP (CANCER CENTER ONLY)
ALT: 7 U/L (ref 0–44)
AST: 13 U/L — ABNORMAL LOW (ref 15–41)
Albumin: 3.3 g/dL — ABNORMAL LOW (ref 3.5–5.0)
Alkaline Phosphatase: 75 U/L (ref 38–126)
Anion gap: 9 (ref 5–15)
BUN: 12 mg/dL (ref 8–23)
CO2: 24 mmol/L (ref 22–32)
Calcium: 8.1 mg/dL — ABNORMAL LOW (ref 8.9–10.3)
Chloride: 105 mmol/L (ref 98–111)
Creatinine: 1.11 mg/dL — ABNORMAL HIGH (ref 0.44–1.00)
GFR, Estimated: 54 mL/min — ABNORMAL LOW (ref >60)
Glucose, Bld: 130 mg/dL — ABNORMAL HIGH (ref 70–99)
Potassium: 3.6 mmol/L (ref 3.5–5.1)
Sodium: 138 mmol/L (ref 135–145)
Total Bilirubin: 0.3 mg/dL (ref 0.0–1.2)
Total Protein: 5.4 g/dL — ABNORMAL LOW (ref 6.5–8.1)

## 2023-09-11 LAB — CBC WITH DIFFERENTIAL (CANCER CENTER ONLY)
Abs Immature Granulocytes: 0.01 10*3/uL (ref 0.00–0.07)
Basophils Absolute: 0 10*3/uL (ref 0.0–0.1)
Basophils Relative: 1 %
Eosinophils Absolute: 0.1 10*3/uL (ref 0.0–0.5)
Eosinophils Relative: 6 %
HCT: 24.5 % — ABNORMAL LOW (ref 36.0–46.0)
Hemoglobin: 8.3 g/dL — ABNORMAL LOW (ref 12.0–15.0)
Immature Granulocytes: 1 %
Lymphocytes Relative: 40 %
Lymphs Abs: 0.8 10*3/uL (ref 0.7–4.0)
MCH: 33.1 pg (ref 26.0–34.0)
MCHC: 33.9 g/dL (ref 30.0–36.0)
MCV: 97.6 fL (ref 80.0–100.0)
Monocytes Absolute: 0.5 10*3/uL (ref 0.1–1.0)
Monocytes Relative: 29 %
Neutro Abs: 0.4 10*3/uL — CL (ref 1.7–7.7)
Neutrophils Relative %: 23 %
Platelet Count: 193 10*3/uL (ref 150–400)
RBC: 2.51 MIL/uL — ABNORMAL LOW (ref 3.87–5.11)
RDW: 12.6 % (ref 11.5–15.5)
WBC Count: 1.9 10*3/uL — ABNORMAL LOW (ref 4.0–10.5)
nRBC: 0 % (ref 0.0–0.2)

## 2023-09-11 MED ORDER — SODIUM CHLORIDE 0.9% FLUSH
10.0000 mL | Freq: Once | INTRAVENOUS | Status: AC
  Administered 2023-09-11: 10 mL

## 2023-09-11 MED ORDER — HEPARIN SOD (PORK) LOCK FLUSH 100 UNIT/ML IV SOLN
500.0000 [IU] | Freq: Once | INTRAVENOUS | Status: AC
  Administered 2023-09-11: 500 [IU]

## 2023-09-11 NOTE — Progress Notes (Signed)
 CRITICAL VALUE STICKER  CRITICAL VALUE: ANC: 0.4  RECEIVER (on-site recipient of call): Thomos Flies, RN   DATE & TIME NOTIFIED: 09/11/2023 @ 810  MESSENGER (representative from lab): Pam  MD NOTIFIED: Dr. Marton Sleeper, MD  TIME OF NOTIFICATION: 09/11/2023 @ 811  RESPONSE:  Treatment cancelled. PAC flushed and deaccessed by RN. Infusion made aware

## 2023-09-11 NOTE — Progress Notes (Signed)
 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[facility]) The patient was diagnosed with fallopian tube cancer in 2018 status post surgery and completion of adjuvant chemotherapy in 2019 She developed recurrent disease in 2020 after presentation with high-grade small bowel obstruction treated with carboplatin , Doxil  and bevacizumab  followed by bevacizumab  maintenance She had gallbladder surgery in November 2023 and adjacent lymph node came back positive for recurrent high-grade serous carcinoma and she was treated with carboplatin , paclitaxel  and bevacizumab  She had progressed on niraparib  and bevacizumab , Neg genetics CT imaging from April show metastatic disease to the liver, regional lymph nodes and pulmonary nodules  She tolerated cycle 1, day 1 of carboplatin  and gemcitabine  well Unfortunately, she developed severe pancytopenia since her last chemotherapy We discussed risk and benefits of deferring treatment and modifying her treatment schedule in the future and she agreed We will hold chemotherapy today She will return next week to resume chemotherapy if her blood counts improve In the future, she will get treatment every other week and we will get her appointment adjusted   Proteins serum plasma low Recommend high-protein diet Pancytopenia, acquired (HCC) She has developed severe pancytopenia We discussed risk and benefits of deferring treatment and she agreed We discussed neutropenic precaution Stage 3 chronic kidney disease, unspecified whether stage 3a or 3b CKD (HCC) We discussed importance of risk factor modification and increase oral fluid intake  Orders Placed This Encounter  Procedures   CBC with Differential (Cancer Center Only)    Standing Status:   Future    Expected Date:    09/18/2023    Expiration Date:   09/17/2024   CMP (Cancer Center only)    Standing Status:   Future    Expected Date:   09/18/2023    Expiration Date:   09/17/2024     Almeda Jacobs, MD  INTERVAL HISTORY: she returns for treatment follow-up Complications related to previous cycle of chemotherapy included pancytopenia, and abnormal serum proteinshe denies side effects such as nausea or constipation No recent infection The patient denies any recent signs or symptoms of bleeding such as spontaneous epistaxis, hematuria or hematochezia. She denies signs or symptoms of fatigue, chest pain or shortness of breath  PHYSICAL EXAMINATION: ECOG PERFORMANCE STATUS: 1 - Symptomatic but completely ambulatory  Lab Results  Component Value Date   CAN125 35.0 08/22/2023   CAN125 30.5 07/25/2023   CAN125 23.3 06/19/2023      Latest Ref Rng & Units 09/11/2023    7:36 AM 08/28/2023   12:09 PM 08/22/2023    9:34 AM  CBC  WBC 4.0 - 10.5 K/uL 1.9  4.1  9.1   Hemoglobin 12.0 - 15.0 g/dL 8.3  02.7  25.3   Hematocrit 36.0 - 46.0 % 24.5  32.6  36.2   Platelets 150 - 400 K/uL 193  129  207       Chemistry      Component Value Date/Time   NA 138 09/11/2023 0736   NA 139 04/14/2017 0742   K 3.6 09/11/2023 0736   K 4.0 04/14/2017 0742   CL 105 09/11/2023 0736   CO2 24 09/11/2023 0736   CO2 21 (L) 04/14/2017 0742   BUN 12 09/11/2023 0736   BUN 13.1 04/14/2017 0742   CREATININE 1.11 (H) 09/11/2023 0736   CREATININE 0.9 04/14/2017 6644  Component Value Date/Time   CALCIUM 8.1 (L) 09/11/2023 0736   CALCIUM 9.2 04/14/2017 0742   ALKPHOS 75 09/11/2023 0736   ALKPHOS 70 04/14/2017 0742   AST 13 (L) 09/11/2023 0736   AST 17 04/14/2017 0742   ALT 7 09/11/2023 0736   ALT 14 04/14/2017 0742   BILITOT 0.3 09/11/2023 0736   BILITOT 0.37 04/14/2017 0742       Vitals:   09/11/23 0805  BP: 127/60  Pulse: (!) 56  Resp: 18  Temp: 98.2 F (36.8 C)  SpO2: 98%   Filed Weights   09/11/23 0805   Weight: 151 lb (68.5 kg)   Other relevant data reviewed during this visit included CBC and CMP

## 2023-09-11 NOTE — Assessment & Plan Note (Addendum)
 We discussed importance of risk factor modification and increase oral fluid intake

## 2023-09-11 NOTE — Telephone Encounter (Signed)
 Pt called to see if we could reschedule he stent removal

## 2023-09-11 NOTE — Assessment & Plan Note (Addendum)
 She has developed severe pancytopenia We discussed risk and benefits of deferring treatment and she agreed We discussed neutropenic precaution

## 2023-09-11 NOTE — Assessment & Plan Note (Addendum)
Recommend high-protein diet 

## 2023-09-11 NOTE — Assessment & Plan Note (Addendum)
 The patient was diagnosed with fallopian tube cancer in 2018 status post surgery and completion of adjuvant chemotherapy in 2019 She developed recurrent disease in 2020 after presentation with high-grade small bowel obstruction treated with carboplatin , Doxil  and bevacizumab  followed by bevacizumab  maintenance She had gallbladder surgery in November 2023 and adjacent lymph node came back positive for recurrent high-grade serous carcinoma and she was treated with carboplatin , paclitaxel  and bevacizumab  She had progressed on niraparib  and bevacizumab , Neg genetics CT imaging from April show metastatic disease to the liver, regional lymph nodes and pulmonary nodules  She tolerated cycle 1, day 1 of carboplatin  and gemcitabine  well Unfortunately, she developed severe pancytopenia since her last chemotherapy We discussed risk and benefits of deferring treatment and modifying her treatment schedule in the future and she agreed We will hold chemotherapy today She will return next week to resume chemotherapy if her blood counts improve In the future, she will get treatment every other week and we will get her appointment adjusted

## 2023-09-12 ENCOUNTER — Ambulatory Visit (INDEPENDENT_AMBULATORY_CARE_PROVIDER_SITE_OTHER): Admitting: Urology

## 2023-09-12 ENCOUNTER — Encounter: Payer: Self-pay | Admitting: Urology

## 2023-09-12 ENCOUNTER — Other Ambulatory Visit: Payer: Self-pay

## 2023-09-12 VITALS — BP 134/74 | HR 79 | Ht 65.0 in | Wt 150.0 lb

## 2023-09-12 DIAGNOSIS — Z466 Encounter for fitting and adjustment of urinary device: Secondary | ICD-10-CM

## 2023-09-12 DIAGNOSIS — N135 Crossing vessel and stricture of ureter without hydronephrosis: Secondary | ICD-10-CM

## 2023-09-12 DIAGNOSIS — N1330 Unspecified hydronephrosis: Secondary | ICD-10-CM

## 2023-09-12 LAB — URINALYSIS, ROUTINE W REFLEX MICROSCOPIC
Bilirubin, UA: NEGATIVE
Glucose, UA: NEGATIVE
Ketones, UA: NEGATIVE
Nitrite, UA: NEGATIVE
Specific Gravity, UA: 1.01 (ref 1.005–1.030)
Urobilinogen, Ur: 0.2 mg/dL (ref 0.2–1.0)
pH, UA: 5.5 (ref 5.0–7.5)

## 2023-09-12 LAB — MICROSCOPIC EXAMINATION: Bacteria, UA: NONE SEEN

## 2023-09-12 NOTE — Progress Notes (Signed)
 Assessment: 1. Hydronephrosis of right kidney   2. Ureteral stricture, right     Plan: Right ureteral stent removed today. Complete antibiotics as prescribed. Return to office in 1 month. Will arrange for renal ultrasound after next visit.   Chief Complaint:  Chief Complaint  Patient presents with   Hydronephrosis    History of Present Illness:  Holly [name] is a 68 y.o. female who is seen for further evaluation of right hydronephrosis. She underwent evaluation with CT imaging on 07/25/2023.  She was found to have right hydroureteronephrosis to the level of a 4 mm stone in the distal right ureter just below the pelvic inlet.  No other stones were seen. No prior history of kidney stones.  She has not had any flank pain.  No dysuria or gross hematuria. She had a UTI approximately 6 months ago. After review of the CT, the 4 mm calculus appeared to be present on prior CT studies dating back to May 2024 making a ureteral calculus less likely. She underwent cystoscopy, retrograde pyelogram, and right ureteroscopy for further evaluation of the right hydronephrosis on 08/19/23.  She was found to have narrowing of the distal right ureter with a segment <1 cm in length.  This was balloon dilated and a 7 French x 24 cm ureteral stent was placed.  She returns today for stent removal.  She has done fairly well.  She is not having any current flank pain or gross hematuria.  She continues on daily cephalexin .  Portions of the above documentation were copied from a prior visit for review purposes only.  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   Ovarian cancer (HCC)  dx'd 12/2016   recurrent dz 02/2019/2023   PONV (postoperative nausea and vomiting)     Past Surgical History:  Past Surgical History:  Procedure Laterality Date   ABDOMINAL HYSTERECTOMY  2018   CYSTOSCOPY/URETEROSCOPY/HOLMIUM LASER/STENT PLACEMENT Right 08/19/2023   Procedure: CYSTOSCOPY/URETEROSCOPY/DILATION OF URETERAL STRICTURE/STENT PLACEMENT;  Surgeon: Mellie Sprinkle., MD;  Location: [facility];  Service: Urology;  Laterality: Right;   DEBULKING N/A 12/26/2016   Procedure: RADICAL TUMOR DEBULKING; OMENTECTOMY;  Surgeon: Alphonso Aschoff, MD;  Location: [facility];  Service: Gynecology;  Laterality: N/A;   ganglion cyst removed     HIATAL HERNIA REPAIR     IR FLUORO GUIDE PORT INSERTION RIGHT  01/16/2017   IR IMAGING GUIDED PORT INSERTION  03/16/2019   IR REMOVAL TUN ACCESS W/ PORT W/O FL MOD SED  06/10/2017   IR US  GUIDE VASC ACCESS RIGHT  01/16/2017   LAPAROTOMY N/A 12/26/2016   Procedure: EXPLORATORY LAPAROTOMY;  Surgeon: Alphonso Aschoff, MD;  Location: [facility];  Service: Gynecology;  Laterality: N/A;   left morton's neuroma surgery      Nissan Fundoplication      Allergies:  Allergies  Allergen Reactions   Dilaudid [Hydromorphone Hcl] Nausea And Vomiting   Hydromorphone Other (See Comments)   Morphine  And Codeine  Nausea Only   Sudafed [Pseudoephedrine Hcl] Anxiety and Other (See Comments)    Nervous/jittery    Family History:  Family History  Problem Relation Age of Onset   Diabetes Mother    Diabetes Father    Hypertension Father    Stroke Father    COPD Father    Heart attack Sister 48   Dementia Maternal Aunt    Bone cancer Paternal Uncle        deceased at 38   Dementia Maternal Grandmother    Heart attack Paternal Grandmother    Heart attack Paternal Grandfather    Healthy Son    Healthy Son     Social History:  Social History   Tobacco Use   Smoking status: Never    Passive exposure: Past   Smokeless tobacco: Never  Vaping Use   Vaping status: Never Used   Substance Use Topics   Alcohol use: No   Drug use: No    ROS: Constitutional:  Negative for fever, chills, weight loss CV: Negative for chest pain, previous MI, hypertension Respiratory:  Negative for shortness of breath, wheezing, sleep apnea, frequent cough GI:  Negative for nausea, vomiting, bloody stool, GERD  Physical exam: BP 134/74   Pulse 79   Ht 5\' 5"  (1.651 m)   Wt 150 lb (68 kg)   BMI 24.96 kg/m  GENERAL APPEARANCE:  Well appearing, well developed, well nourished, NAD HEENT:  Atraumatic, normocephalic, oropharynx clear NECK:  Supple without lymphadenopathy or thyromegaly ABDOMEN:  Soft, non-tender, no masses EXTREMITIES:  Moves all extremities well, without clubbing, cyanosis, or edema NEUROLOGIC:  Alert and oriented x 3, normal gait, CN II-XII grossly intact MENTAL STATUS:  appropriate BACK:  Non-tender to palpation, No CVAT SKIN:  Warm, dry, and intact  Results: U/A: 0-5 WBCs, 3-10 RBCs, no bacteria  CYSTOSCOPY/STENT REMOVAL  Pre-Operative Diagnosis:  right hydronephrosis  Post-Operative Diagnosis: right hydronephrosis  Anesthesia: local with lidocaine  gel  Surgical Narrative:  After appropriate informed consent was obtained, the patient was prepped and draped in the usual sterile fashion in the supine position. She was correctly identified and the proper procedure delineated prior to proceeding. Sterile lidocaine  gel was instilled in the urethra.  The flexible cystoscope was introduced without difficulty. The right ureteral stent was identified and removed using grasping forceps. She tolerated the procedure well.  A chaperone was present throughout the procedure.

## 2023-09-14 ENCOUNTER — Other Ambulatory Visit: Payer: Self-pay

## 2023-09-18 ENCOUNTER — Inpatient Hospital Stay

## 2023-09-18 ENCOUNTER — Other Ambulatory Visit

## 2023-09-18 ENCOUNTER — Encounter: Admitting: Urology

## 2023-09-18 ENCOUNTER — Encounter: Payer: Self-pay | Admitting: Hematology and Oncology

## 2023-09-18 ENCOUNTER — Inpatient Hospital Stay (HOSPITAL_BASED_OUTPATIENT_CLINIC_OR_DEPARTMENT_OTHER): Admitting: Hematology and Oncology

## 2023-09-18 ENCOUNTER — Ambulatory Visit

## 2023-09-18 ENCOUNTER — Ambulatory Visit: Admitting: Hematology and Oncology

## 2023-09-18 VITALS — BP 137/64 | HR 62 | Temp 98.3°F | Resp 16

## 2023-09-18 VITALS — BP 149/52 | HR 60 | Temp 97.8°F | Resp 18 | Ht 65.0 in | Wt 149.6 lb

## 2023-09-18 DIAGNOSIS — Z7189 Other specified counseling: Secondary | ICD-10-CM

## 2023-09-18 DIAGNOSIS — E8809 Other disorders of plasma-protein metabolism, not elsewhere classified: Secondary | ICD-10-CM

## 2023-09-18 DIAGNOSIS — D61818 Other pancytopenia: Secondary | ICD-10-CM

## 2023-09-18 DIAGNOSIS — C5701 Malignant neoplasm of right fallopian tube: Secondary | ICD-10-CM

## 2023-09-18 DIAGNOSIS — N183 Chronic kidney disease, stage 3 unspecified: Secondary | ICD-10-CM

## 2023-09-18 DIAGNOSIS — Z5111 Encounter for antineoplastic chemotherapy: Secondary | ICD-10-CM | POA: Diagnosis not present

## 2023-09-18 LAB — CMP (CANCER CENTER ONLY)
ALT: 6 U/L (ref 0–44)
AST: 16 U/L (ref 15–41)
Albumin: 3.6 g/dL (ref 3.5–5.0)
Alkaline Phosphatase: 85 U/L (ref 38–126)
Anion gap: 7 (ref 5–15)
BUN: 11 mg/dL (ref 8–23)
CO2: 26 mmol/L (ref 22–32)
Calcium: 8.6 mg/dL — ABNORMAL LOW (ref 8.9–10.3)
Chloride: 107 mmol/L (ref 98–111)
Creatinine: 1.06 mg/dL — ABNORMAL HIGH (ref 0.44–1.00)
GFR, Estimated: 57 mL/min — ABNORMAL LOW (ref >60)
Glucose, Bld: 133 mg/dL — ABNORMAL HIGH (ref 70–99)
Potassium: 3.8 mmol/L (ref 3.5–5.1)
Sodium: 140 mmol/L (ref 135–145)
Total Bilirubin: 0.4 mg/dL (ref 0.0–1.2)
Total Protein: 5.9 g/dL — ABNORMAL LOW (ref 6.5–8.1)

## 2023-09-18 LAB — CBC WITH DIFFERENTIAL (CANCER CENTER ONLY)
Abs Immature Granulocytes: 0.08 10*3/uL — ABNORMAL HIGH (ref 0.00–0.07)
Basophils Absolute: 0 10*3/uL (ref 0.0–0.1)
Basophils Relative: 1 %
Eosinophils Absolute: 0.1 10*3/uL (ref 0.0–0.5)
Eosinophils Relative: 4 %
HCT: 29.8 % — ABNORMAL LOW (ref 36.0–46.0)
Hemoglobin: 9.8 g/dL — ABNORMAL LOW (ref 12.0–15.0)
Immature Granulocytes: 3 %
Lymphocytes Relative: 36 %
Lymphs Abs: 1 10*3/uL (ref 0.7–4.0)
MCH: 32.5 pg (ref 26.0–34.0)
MCHC: 32.9 g/dL (ref 30.0–36.0)
MCV: 98.7 fL (ref 80.0–100.0)
Monocytes Absolute: 0.7 10*3/uL (ref 0.1–1.0)
Monocytes Relative: 24 %
Neutro Abs: 0.9 10*3/uL — ABNORMAL LOW (ref 1.7–7.7)
Neutrophils Relative %: 32 %
Platelet Count: 292 10*3/uL (ref 150–400)
RBC: 3.02 MIL/uL — ABNORMAL LOW (ref 3.87–5.11)
RDW: 14.3 % (ref 11.5–15.5)
WBC Count: 2.8 10*3/uL — ABNORMAL LOW (ref 4.0–10.5)
nRBC: 0.7 % — ABNORMAL HIGH (ref 0.0–0.2)

## 2023-09-18 MED ORDER — SODIUM CHLORIDE 0.9 % IV SOLN
311.6000 mg | Freq: Once | INTRAVENOUS | Status: AC
  Administered 2023-09-18: 310 mg via INTRAVENOUS
  Filled 2023-09-18: qty 31

## 2023-09-18 MED ORDER — HEPARIN SOD (PORK) LOCK FLUSH 100 UNIT/ML IV SOLN
500.0000 [IU] | Freq: Once | INTRAVENOUS | Status: AC | PRN
  Administered 2023-09-18: 500 [IU]

## 2023-09-18 MED ORDER — DEXAMETHASONE SODIUM PHOSPHATE 10 MG/ML IJ SOLN
10.0000 mg | Freq: Once | INTRAMUSCULAR | Status: AC
  Administered 2023-09-18: 10 mg via INTRAVENOUS
  Filled 2023-09-18: qty 1

## 2023-09-18 MED ORDER — SODIUM CHLORIDE 0.9% FLUSH
10.0000 mL | Freq: Once | INTRAVENOUS | Status: AC
  Administered 2023-09-18: 10 mL

## 2023-09-18 MED ORDER — SODIUM CHLORIDE 0.9 % IV SOLN
800.0000 mg/m2 | Freq: Once | INTRAVENOUS | Status: AC
  Administered 2023-09-18: 1406 mg via INTRAVENOUS
  Filled 2023-09-18: qty 36.98

## 2023-09-18 MED ORDER — SODIUM CHLORIDE 0.9 % IV SOLN: INTRAVENOUS | Status: DC

## 2023-09-18 MED ORDER — APREPITANT 130 MG/18ML IV EMUL
130.0000 mg | Freq: Once | INTRAVENOUS | Status: AC
  Administered 2023-09-18: 130 mg via INTRAVENOUS
  Filled 2023-09-18: qty 18

## 2023-09-18 MED ORDER — FAMOTIDINE IN NACL 20-0.9 MG/50ML-% IV SOLN
20.0000 mg | Freq: Once | INTRAVENOUS | Status: AC
  Administered 2023-09-18: 20 mg via INTRAVENOUS
  Filled 2023-09-18: qty 50

## 2023-09-18 MED ORDER — PALONOSETRON HCL INJECTION 0.25 MG/5ML
0.2500 mg | Freq: Once | INTRAVENOUS | Status: AC
  Administered 2023-09-18: 0.25 mg via INTRAVENOUS
  Filled 2023-09-18: qty 5

## 2023-09-18 MED ORDER — CETIRIZINE HCL 10 MG/ML IV SOLN
10.0000 mg | Freq: Once | INTRAVENOUS | Status: AC
  Administered 2023-09-18: 10 mg via INTRAVENOUS
  Filled 2023-09-18: qty 1

## 2023-09-18 MED ORDER — SODIUM CHLORIDE 0.9% FLUSH
10.0000 mL | INTRAVENOUS | Status: DC | PRN
  Administered 2023-09-18: 10 mL

## 2023-09-18 NOTE — Assessment & Plan Note (Addendum)
 She has developed severe pancytopenia We discussed risk and benefits of proceeding with treatment and she agreed We discussed neutropenic precaution

## 2023-09-18 NOTE — Assessment & Plan Note (Addendum)
 Recommend high-protein diet Total serum protein has improved today

## 2023-09-18 NOTE — Patient Instructions (Signed)
 [facility] - A DEPT OF [facility]   Discharge Instructions: Thank you for choosing [facility] to provide your oncology and hematology care.   If you have a lab appointment with the Cancer Center, please go directly to the Cancer Center and check in at the registration area.   Wear comfortable clothing and clothing appropriate for easy access to any Portacath or PICC line.   We strive to give you quality time with your provider. You may need to reschedule your appointment if you arrive late (15 or more minutes).  Arriving late affects you and other patients whose appointments are after yours.  Also, if you miss three or more appointments without notifying the office, you may be dismissed from the clinic at the provider's discretion.      For prescription refill requests, have your pharmacy contact our office and allow 72 hours for refills to be completed.    Today you received the following chemotherapy and/or immunotherapy agents: Gemcitabine (Gemzar) and Carboplatin       To help prevent nausea and vomiting after your treatment, we encourage you to take your nausea medication as directed.  BELOW ARE SYMPTOMS THAT SHOULD BE REPORTED IMMEDIATELY: *FEVER GREATER THAN 100.4 F (38 C) OR HIGHER *CHILLS OR SWEATING *NAUSEA AND VOMITING THAT IS NOT CONTROLLED WITH YOUR NAUSEA MEDICATION *UNUSUAL SHORTNESS OF BREATH *UNUSUAL BRUISING OR BLEEDING *URINARY PROBLEMS (pain or burning when urinating, or frequent urination) *BOWEL PROBLEMS (unusual diarrhea, constipation, pain near the anus) TENDERNESS IN MOUTH AND THROAT WITH OR WITHOUT PRESENCE OF ULCERS (sore throat, sores in mouth, or a toothache) UNUSUAL RASH, SWELLING OR PAIN  UNUSUAL VAGINAL DISCHARGE OR ITCHING   Items with * indicate a potential emergency and should be followed up as soon as possible or go to the Emergency Department if any problems should occur.  Please show the  CHEMOTHERAPY ALERT CARD or IMMUNOTHERAPY ALERT CARD at check-in to the Emergency Department and triage nurse.  Should you have questions after your visit or need to cancel or reschedule your appointment, please contact [facility] - A DEPT OF [facility]  Dept: [phone]  and follow the prompts.  Office hours are 8:00 a.m. to 4:30 p.m. Monday - Friday. Please note that voicemails left after 4:00 p.m. may not be returned until the following business day.  We are closed weekends and major holidays. You have access to a nurse at all times for urgent questions. Please call the main number to the clinic Dept: [phone] and follow the prompts.   For any non-urgent questions, you may also contact your provider using MyChart. We now offer e-Visits for anyone 20 and older to request care online for non-urgent symptoms. For details visit [email or web address].   Also download the MyChart app! Go to the app store, search "MyChart", open the app, select [place], and log in with your MyChart username and password.

## 2023-09-18 NOTE — Assessment & Plan Note (Addendum)
 The patient was diagnosed with fallopian tube cancer in 2018 status post surgery and completion of adjuvant chemotherapy in 2019 She developed recurrent disease in 2020 after presentation with high-grade small bowel obstruction treated with carboplatin , Doxil  and bevacizumab  followed by bevacizumab  maintenance She had gallbladder surgery in November 2023 and adjacent lymph node came back positive for recurrent high-grade serous carcinoma and she was treated with carboplatin , paclitaxel  and bevacizumab  She had progressed on niraparib  and bevacizumab , Neg genetics CT imaging from April show metastatic disease to the liver, regional lymph nodes and pulmonary nodules  Cycle 1 of treatment was complicated by severe pancytopenia requiring changes to the treatment plan Despite taking a week off, she remained pancytopenic We discussed risk and benefits of proceeding with treatment despite pancytopenia and she agreed We will continue with dose modification of her treatment plan Plan to repeat imaging study in July

## 2023-09-18 NOTE — Progress Notes (Signed)
 [facility] OFFICE PROGRESS NOTE  Patient Care Team: Glena Landau, MD as PCP - General (Family Medicine) Almeda Jacobs, MD as Consulting Physician (Hematology and Oncology)  Assessment & Plan Adenocarcinoma of right fallopian tube [facility]) The patient was diagnosed with fallopian tube cancer in 2018 status post surgery and completion of adjuvant chemotherapy in 2019 She developed recurrent disease in 2020 after presentation with high-grade small bowel obstruction treated with carboplatin , Doxil  and bevacizumab  followed by bevacizumab  maintenance She had gallbladder surgery in November 2023 and adjacent lymph node came back positive for recurrent high-grade serous carcinoma and she was treated with carboplatin , paclitaxel  and bevacizumab  She had progressed on niraparib  and bevacizumab , Neg genetics CT imaging from April show metastatic disease to the liver, regional lymph nodes and pulmonary nodules  Cycle 1 of treatment was complicated by severe pancytopenia requiring changes to the treatment plan Despite taking a week off, she remained pancytopenic We discussed risk and benefits of proceeding with treatment despite pancytopenia and she agreed We will continue with dose modification of her treatment plan Plan to repeat imaging study in July   Pancytopenia, acquired [facility]) She has developed severe pancytopenia We discussed risk and benefits of proceeding with treatment and she agreed We discussed neutropenic precaution Stage 3 chronic kidney disease, unspecified whether stage 3a or 3b CKD (HCC) We discussed importance of risk factor modification and increase oral fluid intake Proteins serum plasma low Recommend high-protein diet Total serum protein has improved today  No orders of the defined types were placed in this encounter.    Almeda Jacobs, MD  INTERVAL HISTORY: she returns for treatment follow-up Complications related to previous cycle of chemotherapy included  pancytopenia, and low total protein She has gained some weight since last time I saw her Denies recent bleeding or infection  PHYSICAL EXAMINATION: ECOG PERFORMANCE STATUS: 1 - Symptomatic but completely ambulatory  Lab Results  Component Value Date   CAN125 35.0 08/22/2023   CAN125 30.5 07/25/2023   CAN125 23.3 06/19/2023      Latest Ref Rng & Units 09/18/2023    8:52 AM 09/11/2023    7:36 AM 08/28/2023   12:09 PM  CBC  WBC 4.0 - 10.5 K/uL 2.8  1.9  4.1   Hemoglobin 12.0 - 15.0 g/dL 9.8  8.3  16.1   Hematocrit 36.0 - 46.0 % 29.8  24.5  32.6   Platelets 150 - 400 K/uL 292  193  129       Chemistry      Component Value Date/Time   NA 140 09/18/2023 0852   NA 139 04/14/2017 0742   K 3.8 09/18/2023 0852   K 4.0 04/14/2017 0742   CL 107 09/18/2023 0852   CO2 26 09/18/2023 0852   CO2 21 (L) 04/14/2017 0742   BUN 11 09/18/2023 0852   BUN 13.1 04/14/2017 0742   CREATININE 1.06 (H) 09/18/2023 0852   CREATININE 0.9 04/14/2017 0742      Component Value Date/Time   CALCIUM 8.6 (L) 09/18/2023 0852   CALCIUM 9.2 04/14/2017 0742   ALKPHOS 85 09/18/2023 0852   ALKPHOS 70 04/14/2017 0742   AST 16 09/18/2023 0852   AST 17 04/14/2017 0742   ALT 6 09/18/2023 0852   ALT 14 04/14/2017 0742   BILITOT 0.4 09/18/2023 0852   BILITOT 0.37 04/14/2017 0742       Vitals:   09/18/23 0936  BP: (!) 149/52  Pulse: 60  Resp: 18  Temp: 97.8 F (36.6 C)  SpO2: 100%   Filed Weights   09/18/23 0936  Weight: 149 lb 9.6 oz (67.9 kg)   Other relevant data reviewed during this visit included CBC and CMP

## 2023-09-18 NOTE — Assessment & Plan Note (Addendum)
 We discussed importance of risk factor modification and increase oral fluid intake

## 2023-10-02 ENCOUNTER — Encounter: Payer: Self-pay | Admitting: Hematology and Oncology

## 2023-10-02 ENCOUNTER — Inpatient Hospital Stay

## 2023-10-02 ENCOUNTER — Inpatient Hospital Stay: Attending: Hematology and Oncology

## 2023-10-02 ENCOUNTER — Inpatient Hospital Stay (HOSPITAL_BASED_OUTPATIENT_CLINIC_OR_DEPARTMENT_OTHER): Admitting: Hematology and Oncology

## 2023-10-02 VITALS — BP 135/54 | HR 69 | Temp 98.0°F | Resp 18 | Ht 65.0 in | Wt 150.6 lb

## 2023-10-02 DIAGNOSIS — N183 Chronic kidney disease, stage 3 unspecified: Secondary | ICD-10-CM | POA: Diagnosis not present

## 2023-10-02 DIAGNOSIS — L03115 Cellulitis of right lower limb: Secondary | ICD-10-CM | POA: Diagnosis not present

## 2023-10-02 DIAGNOSIS — C5701 Malignant neoplasm of right fallopian tube: Secondary | ICD-10-CM

## 2023-10-02 DIAGNOSIS — Z5111 Encounter for antineoplastic chemotherapy: Secondary | ICD-10-CM | POA: Diagnosis present

## 2023-10-02 DIAGNOSIS — D61818 Other pancytopenia: Secondary | ICD-10-CM

## 2023-10-02 DIAGNOSIS — Z7189 Other specified counseling: Secondary | ICD-10-CM

## 2023-10-02 LAB — CBC WITH DIFFERENTIAL (CANCER CENTER ONLY)
Abs Immature Granulocytes: 0.01 10*3/uL (ref 0.00–0.07)
Basophils Absolute: 0 10*3/uL (ref 0.0–0.1)
Basophils Relative: 1 %
Eosinophils Absolute: 0.1 10*3/uL (ref 0.0–0.5)
Eosinophils Relative: 2 %
HCT: 27.4 % — ABNORMAL LOW (ref 36.0–46.0)
Hemoglobin: 9 g/dL — ABNORMAL LOW (ref 12.0–15.0)
Immature Granulocytes: 0 %
Lymphocytes Relative: 21 %
Lymphs Abs: 0.8 10*3/uL (ref 0.7–4.0)
MCH: 32.3 pg (ref 26.0–34.0)
MCHC: 32.8 g/dL (ref 30.0–36.0)
MCV: 98.2 fL (ref 80.0–100.0)
Monocytes Absolute: 0.7 10*3/uL (ref 0.1–1.0)
Monocytes Relative: 19 %
Neutro Abs: 2.2 10*3/uL (ref 1.7–7.7)
Neutrophils Relative %: 57 %
Platelet Count: 110 10*3/uL — ABNORMAL LOW (ref 150–400)
RBC: 2.79 MIL/uL — ABNORMAL LOW (ref 3.87–5.11)
RDW: 14.6 % (ref 11.5–15.5)
WBC Count: 3.8 10*3/uL — ABNORMAL LOW (ref 4.0–10.5)
nRBC: 0 % (ref 0.0–0.2)

## 2023-10-02 LAB — CMP (CANCER CENTER ONLY)
ALT: 7 U/L (ref 0–44)
AST: 17 U/L (ref 15–41)
Albumin: 3.6 g/dL (ref 3.5–5.0)
Alkaline Phosphatase: 84 U/L (ref 38–126)
Anion gap: 6 (ref 5–15)
BUN: 11 mg/dL (ref 8–23)
CO2: 27 mmol/L (ref 22–32)
Calcium: 8.8 mg/dL — ABNORMAL LOW (ref 8.9–10.3)
Chloride: 107 mmol/L (ref 98–111)
Creatinine: 1.09 mg/dL — ABNORMAL HIGH (ref 0.44–1.00)
GFR, Estimated: 55 mL/min — ABNORMAL LOW (ref >60)
Glucose, Bld: 120 mg/dL — ABNORMAL HIGH (ref 70–99)
Potassium: 4 mmol/L (ref 3.5–5.1)
Sodium: 140 mmol/L (ref 135–145)
Total Bilirubin: 0.4 mg/dL (ref 0.0–1.2)
Total Protein: 5.7 g/dL — ABNORMAL LOW (ref 6.5–8.1)

## 2023-10-02 MED ORDER — SODIUM CHLORIDE 0.9% FLUSH
10.0000 mL | Freq: Once | INTRAVENOUS | Status: AC
  Administered 2023-10-02: 10 mL

## 2023-10-02 MED ORDER — HEPARIN SOD (PORK) LOCK FLUSH 100 UNIT/ML IV SOLN
500.0000 [IU] | Freq: Once | INTRAVENOUS | Status: AC | PRN
  Administered 2023-10-02: 500 [IU]

## 2023-10-02 MED ORDER — SODIUM CHLORIDE 0.9 % IV SOLN
800.0000 mg/m2 | Freq: Once | INTRAVENOUS | Status: AC
  Administered 2023-10-02: 1406 mg via INTRAVENOUS
  Filled 2023-10-02: qty 36.98

## 2023-10-02 MED ORDER — PROCHLORPERAZINE MALEATE 10 MG PO TABS
10.0000 mg | ORAL_TABLET | Freq: Once | ORAL | Status: AC
  Administered 2023-10-02: 10 mg via ORAL
  Filled 2023-10-02: qty 1

## 2023-10-02 MED ORDER — SODIUM CHLORIDE 0.9 % IV SOLN: INTRAVENOUS | Status: DC

## 2023-10-02 MED ORDER — SODIUM CHLORIDE 0.9% FLUSH
10.0000 mL | INTRAVENOUS | Status: DC | PRN
  Administered 2023-10-02: 10 mL

## 2023-10-02 NOTE — Assessment & Plan Note (Addendum)
 We discussed importance of risk factor modification and increase oral fluid intake

## 2023-10-02 NOTE — Patient Instructions (Signed)

## 2023-10-02 NOTE — Progress Notes (Signed)
 [facility] OFFICE PROGRESS NOTE  Patient Care Team: Glena Landau, MD as PCP - General (Family Medicine) Almeda Jacobs, MD as Consulting Physician (Hematology and Oncology)  Assessment & Plan Adenocarcinoma of right fallopian tube [facility]) The patient was diagnosed with fallopian tube cancer in 2018 status post surgery and completion of adjuvant chemotherapy in 2019 She developed recurrent disease in 2020 after presentation with high-grade small bowel obstruction treated with carboplatin , Doxil  and bevacizumab  followed by bevacizumab  maintenance She had gallbladder surgery in November 2023 and adjacent lymph node came back positive for recurrent high-grade serous carcinoma and she was treated with carboplatin , paclitaxel  and bevacizumab  She had progressed on niraparib  and bevacizumab , Neg genetics CT imaging from April show metastatic disease to the liver, regional lymph nodes and pulmonary nodules  Cycle 1 of treatment was complicated by severe pancytopenia requiring changes to the treatment plan Despite taking a week off, she remained pancytopenic but overall, leukopenia has improved We discussed risk and benefits of proceeding with treatment despite pancytopenia and she agreed We will continue with dose modification of her treatment plan Plan to repeat imaging study in July   Pancytopenia, acquired [facility]) She has developed pancytopenia but not symptomatic We will proceed with treatment without delay Stage 3 chronic kidney disease, unspecified whether stage 3a or 3b CKD (HCC) We discussed importance of risk factor modification and increase oral fluid intake  No orders of the defined types were placed in this encounter.    Almeda Jacobs, MD  INTERVAL HISTORY: she returns for treatment follow-up Complications related to previous cycle of chemotherapy included pancytopenia, and elevated serum creatinine She denies other side effects of treatment She remains very active  with all activities of daily living  PHYSICAL EXAMINATION: ECOG PERFORMANCE STATUS: 1 - Symptomatic but completely ambulatory  Lab Results  Component Value Date   CAN125 35.0 08/22/2023   CAN125 30.5 07/25/2023   CAN125 23.3 06/19/2023      Latest Ref Rng & Units 10/02/2023    8:36 AM 09/18/2023    8:52 AM 09/11/2023    7:36 AM  CBC  WBC 4.0 - 10.5 K/uL 3.8  2.8  1.9   Hemoglobin 12.0 - 15.0 g/dL 9.0  9.8  8.3   Hematocrit 36.0 - 46.0 % 27.4  29.8  24.5   Platelets 150 - 400 K/uL 110  292  193       Chemistry      Component Value Date/Time   NA 140 10/02/2023 0836   NA 139 04/14/2017 0742   K 4.0 10/02/2023 0836   K 4.0 04/14/2017 0742   CL 107 10/02/2023 0836   CO2 27 10/02/2023 0836   CO2 21 (L) 04/14/2017 0742   BUN 11 10/02/2023 0836   BUN 13.1 04/14/2017 0742   CREATININE 1.09 (H) 10/02/2023 0836   CREATININE 0.9 04/14/2017 0742      Component Value Date/Time   CALCIUM 8.8 (L) 10/02/2023 0836   CALCIUM 9.2 04/14/2017 0742   ALKPHOS 84 10/02/2023 0836   ALKPHOS 70 04/14/2017 0742   AST 17 10/02/2023 0836   AST 17 04/14/2017 0742   ALT 7 10/02/2023 0836   ALT 14 04/14/2017 0742   BILITOT 0.4 10/02/2023 0836   BILITOT 0.37 04/14/2017 0742       Vitals:   10/02/23 0909  BP: (!) 135/54  Pulse: 69  Resp: 18  Temp: 98 F (36.7 C)  SpO2: 94%   Filed Weights   10/02/23 0909  Weight: 150  lb 9.6 oz (68.3 kg)   Other relevant data reviewed during this visit included CBC and CMP

## 2023-10-02 NOTE — Assessment & Plan Note (Addendum)
 The patient was diagnosed with fallopian tube cancer in 2018 status post surgery and completion of adjuvant chemotherapy in 2019 She developed recurrent disease in 2020 after presentation with high-grade small bowel obstruction treated with carboplatin , Doxil  and bevacizumab  followed by bevacizumab  maintenance She had gallbladder surgery in November 2023 and adjacent lymph node came back positive for recurrent high-grade serous carcinoma and she was treated with carboplatin , paclitaxel  and bevacizumab  She had progressed on niraparib  and bevacizumab , Neg genetics CT imaging from April show metastatic disease to the liver, regional lymph nodes and pulmonary nodules  Cycle 1 of treatment was complicated by severe pancytopenia requiring changes to the treatment plan Despite taking a week off, she remained pancytopenic but overall, leukopenia has improved We discussed risk and benefits of proceeding with treatment despite pancytopenia and she agreed We will continue with dose modification of her treatment plan Plan to repeat imaging study in July

## 2023-10-02 NOTE — Assessment & Plan Note (Addendum)
 She has developed pancytopenia but not symptomatic We will proceed with treatment without delay

## 2023-10-07 ENCOUNTER — Encounter: Payer: Self-pay | Admitting: Hematology and Oncology

## 2023-10-07 ENCOUNTER — Telehealth: Payer: Self-pay

## 2023-10-07 ENCOUNTER — Inpatient Hospital Stay (HOSPITAL_BASED_OUTPATIENT_CLINIC_OR_DEPARTMENT_OTHER): Admitting: Hematology and Oncology

## 2023-10-07 VITALS — BP 117/49 | HR 67 | Resp 18 | Ht 65.0 in | Wt 151.8 lb

## 2023-10-07 DIAGNOSIS — L03115 Cellulitis of right lower limb: Secondary | ICD-10-CM | POA: Diagnosis not present

## 2023-10-07 DIAGNOSIS — Z5111 Encounter for antineoplastic chemotherapy: Secondary | ICD-10-CM | POA: Diagnosis not present

## 2023-10-07 MED ORDER — SULFAMETHOXAZOLE-TRIMETHOPRIM 800-160 MG PO TABS: 1.0000 | ORAL_TABLET | Freq: Two times a day (BID) | ORAL | 0 refills | Status: DC

## 2023-10-07 NOTE — Telephone Encounter (Signed)
 Called and scheduled appt today at 2 pm. She is aware of appt time and will arrive early.

## 2023-10-07 NOTE — Assessment & Plan Note (Addendum)
 Examination of the anterior aspect of her right anterior shin area is worrisome for possible cellulitis Given her immunocompromise state and recent chemotherapy, I recommend a course of oral antibiotics I marked the area of involvement and I educated the patient to monitor for signs of progression/improvement She will contact me if she is not better by the end of the week

## 2023-10-07 NOTE — Progress Notes (Signed)
 [facility] OFFICE PROGRESS NOTE  Patient Care Team: Glena Landau, MD as PCP - General (Family Medicine) Almeda Jacobs, MD as Consulting Physician (Hematology and Oncology)  Assessment & Plan Cellulitis of right anterior lower leg Examination of the anterior aspect of her right anterior shin area is worrisome for possible cellulitis Given her immunocompromise state and recent chemotherapy, I recommend a course of oral antibiotics I marked the area of involvement and I educated the patient to monitor for signs of progression/improvement She will contact me if she is not better by the end of the week  No orders of the defined types were placed in this encounter.    Almeda Jacobs, MD  INTERVAL HISTORY: she returns for surveillance follow-up and concern for possible cellulitis She noted an area of abnormalities on the anterior part of her shin over the past 24 hours The area is warm to touch and slightly painful She denies recent trauma to the area or insect bite No recent fever or chills  PHYSICAL EXAMINATION: ECOG PERFORMANCE STATUS: 1 - Symptomatic but completely ambulatory  Vitals:   10/07/23 1414  BP: (!) 117/49  Pulse: 67  Resp: 18  SpO2: 99%   Filed Weights   10/07/23 1414  Weight: 151 lb 12.8 oz (68.9 kg)  I examined her skin on the anterior portion of her right leg It is warm to touch The area measure approximately 2 inches time 4 inches in size and I marked the area with marker pen today

## 2023-10-09 ENCOUNTER — Ambulatory Visit: Admitting: Hematology and Oncology

## 2023-10-09 ENCOUNTER — Other Ambulatory Visit

## 2023-10-09 ENCOUNTER — Ambulatory Visit

## 2023-10-16 ENCOUNTER — Inpatient Hospital Stay

## 2023-10-16 ENCOUNTER — Encounter: Payer: Self-pay | Admitting: Hematology and Oncology

## 2023-10-16 ENCOUNTER — Inpatient Hospital Stay: Admitting: Hematology and Oncology

## 2023-10-16 VITALS — BP 118/50 | HR 64 | Temp 99.0°F | Resp 18 | Ht 65.0 in | Wt 152.4 lb

## 2023-10-16 DIAGNOSIS — Z7189 Other specified counseling: Secondary | ICD-10-CM

## 2023-10-16 DIAGNOSIS — C5701 Malignant neoplasm of right fallopian tube: Secondary | ICD-10-CM

## 2023-10-16 DIAGNOSIS — D61818 Other pancytopenia: Secondary | ICD-10-CM | POA: Diagnosis not present

## 2023-10-16 DIAGNOSIS — Z5111 Encounter for antineoplastic chemotherapy: Secondary | ICD-10-CM | POA: Diagnosis not present

## 2023-10-16 LAB — CMP (CANCER CENTER ONLY)
ALT: 8 U/L (ref 0–44)
AST: 17 U/L (ref 15–41)
Albumin: 3.5 g/dL (ref 3.5–5.0)
Alkaline Phosphatase: 65 U/L (ref 38–126)
Anion gap: 8 (ref 5–15)
BUN: 13 mg/dL (ref 8–23)
CO2: 24 mmol/L (ref 22–32)
Calcium: 8.5 mg/dL — ABNORMAL LOW (ref 8.9–10.3)
Chloride: 107 mmol/L (ref 98–111)
Creatinine: 1.12 mg/dL — ABNORMAL HIGH (ref 0.44–1.00)
GFR, Estimated: 53 mL/min — ABNORMAL LOW (ref >60)
Glucose, Bld: 141 mg/dL — ABNORMAL HIGH (ref 70–99)
Potassium: 4 mmol/L (ref 3.5–5.1)
Sodium: 139 mmol/L (ref 135–145)
Total Bilirubin: 0.4 mg/dL (ref 0.0–1.2)
Total Protein: 5.8 g/dL — ABNORMAL LOW (ref 6.5–8.1)

## 2023-10-16 LAB — CBC WITH DIFFERENTIAL (CANCER CENTER ONLY)
Abs Immature Granulocytes: 0.06 10*3/uL (ref 0.00–0.07)
Basophils Absolute: 0 10*3/uL (ref 0.0–0.1)
Basophils Relative: 1 %
Eosinophils Absolute: 0.3 10*3/uL (ref 0.0–0.5)
Eosinophils Relative: 7 %
HCT: 28 % — ABNORMAL LOW (ref 36.0–46.0)
Hemoglobin: 9.3 g/dL — ABNORMAL LOW (ref 12.0–15.0)
Immature Granulocytes: 2 %
Lymphocytes Relative: 27 %
Lymphs Abs: 1.1 10*3/uL (ref 0.7–4.0)
MCH: 33 pg (ref 26.0–34.0)
MCHC: 33.2 g/dL (ref 30.0–36.0)
MCV: 99.3 fL (ref 80.0–100.0)
Monocytes Absolute: 0.6 10*3/uL (ref 0.1–1.0)
Monocytes Relative: 14 %
Neutro Abs: 2 10*3/uL (ref 1.7–7.7)
Neutrophils Relative %: 49 %
Platelet Count: 165 10*3/uL (ref 150–400)
RBC: 2.82 MIL/uL — ABNORMAL LOW (ref 3.87–5.11)
RDW: 16.5 % — ABNORMAL HIGH (ref 11.5–15.5)
WBC Count: 4 10*3/uL (ref 4.0–10.5)
nRBC: 0 % (ref 0.0–0.2)

## 2023-10-16 MED ORDER — SODIUM CHLORIDE 0.9 % IV SOLN
800.0000 mg/m2 | Freq: Once | INTRAVENOUS | Status: AC
  Administered 2023-10-16: 1406 mg via INTRAVENOUS
  Filled 2023-10-16: qty 36.98

## 2023-10-16 MED ORDER — PALONOSETRON HCL INJECTION 0.25 MG/5ML
0.2500 mg | Freq: Once | INTRAVENOUS | Status: AC
  Administered 2023-10-16: 0.25 mg via INTRAVENOUS
  Filled 2023-10-16: qty 5

## 2023-10-16 MED ORDER — APREPITANT 130 MG/18ML IV EMUL
130.0000 mg | Freq: Once | INTRAVENOUS | Status: AC
  Administered 2023-10-16: 130 mg via INTRAVENOUS
  Filled 2023-10-16: qty 18

## 2023-10-16 MED ORDER — CETIRIZINE HCL 10 MG/ML IV SOLN
10.0000 mg | Freq: Once | INTRAVENOUS | Status: AC
  Administered 2023-10-16: 10 mg via INTRAVENOUS
  Filled 2023-10-16: qty 1

## 2023-10-16 MED ORDER — SODIUM CHLORIDE 0.9% FLUSH
10.0000 mL | Freq: Once | INTRAVENOUS | Status: AC
  Administered 2023-10-16: 10 mL

## 2023-10-16 MED ORDER — FAMOTIDINE IN NACL 20-0.9 MG/50ML-% IV SOLN
20.0000 mg | Freq: Once | INTRAVENOUS | Status: AC
  Administered 2023-10-16: 20 mg via INTRAVENOUS
  Filled 2023-10-16: qty 50

## 2023-10-16 MED ORDER — SODIUM CHLORIDE 0.9 % IV SOLN
INTRAVENOUS | Status: DC
Start: 2023-10-16 — End: 2023-10-16

## 2023-10-16 MED ORDER — SODIUM CHLORIDE 0.9 % IV SOLN
297.6000 mg | Freq: Once | INTRAVENOUS | Status: AC
  Administered 2023-10-16: 300 mg via INTRAVENOUS
  Filled 2023-10-16: qty 30

## 2023-10-16 MED ORDER — DEXAMETHASONE SODIUM PHOSPHATE 10 MG/ML IJ SOLN
10.0000 mg | Freq: Once | INTRAMUSCULAR | Status: AC
  Administered 2023-10-16: 10 mg via INTRAVENOUS
  Filled 2023-10-16: qty 1

## 2023-10-16 NOTE — Patient Instructions (Signed)
 [facility] - A DEPT OF [facility]   Discharge Instructions: Thank you for choosing [facility] to provide your oncology and hematology care.   If you have a lab appointment with the Cancer Center, please go directly to the Cancer Center and check in at the registration area.   Wear comfortable clothing and clothing appropriate for easy access to any Portacath or PICC line.   We strive to give you quality time with your provider. You may need to reschedule your appointment if you arrive late (15 or more minutes).  Arriving late affects you and other patients whose appointments are after yours.  Also, if you miss three or more appointments without notifying the office, you may be dismissed from the clinic at the provider's discretion.      For prescription refill requests, have your pharmacy contact our office and allow 72 hours for refills to be completed.    Today you received the following chemotherapy and/or immunotherapy agents: Gemcitabine (Gemzar) and Carboplatin       To help prevent nausea and vomiting after your treatment, we encourage you to take your nausea medication as directed.  BELOW ARE SYMPTOMS THAT SHOULD BE REPORTED IMMEDIATELY: *FEVER GREATER THAN 100.4 F (38 C) OR HIGHER *CHILLS OR SWEATING *NAUSEA AND VOMITING THAT IS NOT CONTROLLED WITH YOUR NAUSEA MEDICATION *UNUSUAL SHORTNESS OF BREATH *UNUSUAL BRUISING OR BLEEDING *URINARY PROBLEMS (pain or burning when urinating, or frequent urination) *BOWEL PROBLEMS (unusual diarrhea, constipation, pain near the anus) TENDERNESS IN MOUTH AND THROAT WITH OR WITHOUT PRESENCE OF ULCERS (sore throat, sores in mouth, or a toothache) UNUSUAL RASH, SWELLING OR PAIN  UNUSUAL VAGINAL DISCHARGE OR ITCHING   Items with * indicate a potential emergency and should be followed up as soon as possible or go to the Emergency Department if any problems should occur.  Please show the  CHEMOTHERAPY ALERT CARD or IMMUNOTHERAPY ALERT CARD at check-in to the Emergency Department and triage nurse.  Should you have questions after your visit or need to cancel or reschedule your appointment, please contact [facility] - A DEPT OF [facility]  Dept: [phone]  and follow the prompts.  Office hours are 8:00 a.m. to 4:30 p.m. Monday - Friday. Please note that voicemails left after 4:00 p.m. may not be returned until the following business day.  We are closed weekends and major holidays. You have access to a nurse at all times for urgent questions. Please call the main number to the clinic Dept: [phone] and follow the prompts.   For any non-urgent questions, you may also contact your provider using MyChart. We now offer e-Visits for anyone 20 and older to request care online for non-urgent symptoms. For details visit [email or web address].   Also download the MyChart app! Go to the app store, search "MyChart", open the app, select [place], and log in with your MyChart username and password.

## 2023-10-16 NOTE — Assessment & Plan Note (Addendum)
 The patient was diagnosed with fallopian tube cancer in 2018 status post surgery and completion of adjuvant chemotherapy in 2019 She developed recurrent disease in 2020 after presentation with high-grade small bowel obstruction treated with carboplatin , Doxil  and bevacizumab  followed by bevacizumab  maintenance She had gallbladder surgery in November 2023 and adjacent lymph node came back positive for recurrent high-grade serous carcinoma and she was treated with carboplatin , paclitaxel  and bevacizumab  She had progressed on niraparib  and bevacizumab , Neg genetics CT imaging from April show metastatic disease to the liver, regional lymph nodes and pulmonary nodules  Since cycle 1 of treatment, her course was complicated by severe pancytopenia requiring changes to the treatment plan The dose of treatment is modified and she is receiving treatment every other week Her blood count is stable and she denies side effects from recent treatment We will proceed with treatment without delay Plan to repeat imaging study in July

## 2023-10-16 NOTE — Assessment & Plan Note (Addendum)
 She has intermittent pancytopenia due to treatment She is not symptomatic The cause of her anemia today is related to recent treatment and mild chronic kidney disease We will proceed with treatment without delay

## 2023-10-16 NOTE — Progress Notes (Signed)
 [facility] OFFICE PROGRESS NOTE  Patient Care Team: Loreli Kins, MD as PCP - General (Family Medicine) Lonn Hicks, MD as Consulting Physician (Hematology and Oncology)  Assessment & Plan Adenocarcinoma of right fallopian tube [facility]) The patient was diagnosed with fallopian tube cancer in 2018 status post surgery and completion of adjuvant chemotherapy in 2019 She developed recurrent disease in 2020 after presentation with high-grade small bowel obstruction treated with carboplatin , Doxil  and bevacizumab  followed by bevacizumab  maintenance She had gallbladder surgery in November 2023 and adjacent lymph node came back positive for recurrent high-grade serous carcinoma and she was treated with carboplatin , paclitaxel  and bevacizumab  She had progressed on niraparib  and bevacizumab , Neg genetics CT imaging from April show metastatic disease to the liver, regional lymph nodes and pulmonary nodules  Since cycle 1 of treatment, her course was complicated by severe pancytopenia requiring changes to the treatment plan The dose of treatment is modified and she is receiving treatment every other week Her blood count is stable and she denies side effects from recent treatment We will proceed with treatment without delay Plan to repeat imaging study in July   Pancytopenia, acquired [facility]) She has intermittent pancytopenia due to treatment She is not symptomatic The cause of her anemia today is related to recent treatment and mild chronic kidney disease We will proceed with treatment without delay  No orders of the defined types were placed in this encounter.    Hicks Lonn, MD  INTERVAL HISTORY: she returns for treatment follow-up Complications related to previous cycle of chemotherapy included anemia,  PHYSICAL EXAMINATION: ECOG PERFORMANCE STATUS: 1 - Symptomatic but completely ambulatory  Lab Results  Component Value Date   CAN125 35.0 08/22/2023   CAN125 30.5  07/25/2023   CAN125 23.3 06/19/2023      Latest Ref Rng & Units 10/16/2023    7:42 AM 10/02/2023    8:36 AM 09/18/2023    8:52 AM  CBC  WBC 4.0 - 10.5 K/uL 4.0  3.8  2.8   Hemoglobin 12.0 - 15.0 g/dL 9.3  9.0  9.8   Hematocrit 36.0 - 46.0 % 28.0  27.4  29.8   Platelets 150 - 400 K/uL 165  110  292       Chemistry      Component Value Date/Time   NA 139 10/16/2023 0742   NA 139 04/14/2017 0742   K 4.0 10/16/2023 0742   K 4.0 04/14/2017 0742   CL 107 10/16/2023 0742   CO2 24 10/16/2023 0742   CO2 21 (L) 04/14/2017 0742   BUN 13 10/16/2023 0742   BUN 13.1 04/14/2017 0742   CREATININE 1.12 (H) 10/16/2023 0742   CREATININE 0.9 04/14/2017 0742      Component Value Date/Time   CALCIUM 8.5 (L) 10/16/2023 0742   CALCIUM 9.2 04/14/2017 0742   ALKPHOS 65 10/16/2023 0742   ALKPHOS 70 04/14/2017 0742   AST 17 10/16/2023 0742   AST 17 04/14/2017 0742   ALT 8 10/16/2023 0742   ALT 14 04/14/2017 0742   BILITOT 0.4 10/16/2023 0742   BILITOT 0.37 04/14/2017 0742       Vitals:   10/16/23 0814  BP: (!) 118/50  Pulse: 64  Resp: 18  Temp: 99 F (37.2 C)  SpO2: 99%   Filed Weights   10/16/23 0814  Weight: 152 lb 6.4 oz (69.1 kg)   Other relevant data reviewed during this visit included CBC and CMP

## 2023-10-21 ENCOUNTER — Ambulatory Visit (INDEPENDENT_AMBULATORY_CARE_PROVIDER_SITE_OTHER): Admitting: Urology

## 2023-10-21 ENCOUNTER — Encounter: Payer: Self-pay | Admitting: Urology

## 2023-10-21 VITALS — BP 117/68 | HR 73 | Ht 65.0 in | Wt 152.0 lb

## 2023-10-21 DIAGNOSIS — N135 Crossing vessel and stricture of ureter without hydronephrosis: Secondary | ICD-10-CM | POA: Diagnosis not present

## 2023-10-21 DIAGNOSIS — N1330 Unspecified hydronephrosis: Secondary | ICD-10-CM

## 2023-10-21 LAB — URINALYSIS, ROUTINE W REFLEX MICROSCOPIC
Bilirubin, UA: NEGATIVE
Glucose, UA: NEGATIVE
Ketones, UA: NEGATIVE
Nitrite, UA: NEGATIVE
Protein,UA: NEGATIVE
Specific Gravity, UA: 1.015 (ref 1.005–1.030)
Urobilinogen, Ur: 2 mg/dL — ABNORMAL HIGH (ref 0.2–1.0)
pH, UA: 5.5 (ref 5.0–7.5)

## 2023-10-21 LAB — MICROSCOPIC EXAMINATION: Bacteria, UA: NONE SEEN

## 2023-10-21 NOTE — Progress Notes (Signed)
 Assessment: 1. Hydronephrosis of right kidney   2. Ureteral stricture, right     Plan: Await results of her upcoming CT. Return to office in 3 months  Chief Complaint:  Chief Complaint  Patient presents with   Hydronephrosis    History of Present Illness:  Holly [name] is a 68 y.o. female who is seen for further evaluation of right hydronephrosis. She underwent evaluation with CT imaging on 07/25/2023.  She was found to have right hydroureteronephrosis to the level of a 4 mm stone in the distal right ureter just below the pelvic inlet.  No other stones were seen. No prior history of kidney stones.  She has not had any flank pain.  No dysuria or gross hematuria. She had a UTI approximately 6 months ago. After review of the CT, the 4 mm calculus appeared to be present on prior CT studies dating back to May 2024 making a ureteral calculus less likely. She underwent cystoscopy, retrograde pyelogram, and right ureteroscopy for further evaluation of the right hydronephrosis on 08/19/23.  She was found to have narrowing of the distal right ureter with a segment <1 cm in length.  This was balloon dilated and a 7 French x 24 cm ureteral stent was placed. Her stent was removed on 09/12/2023.  She returns today for follow-up.  She reports that she has done well following her stent removal.  No flank pain.  No dysuria or gross hematuria. Cr 1.12 from 10/16/23.  Portions of the above documentation were copied from a prior visit for review purposes only.  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   Ovarian cancer (HCC) dx'd 12/2016   recurrent dz 02/2019/2023   PONV  (postoperative nausea and vomiting)     Past Surgical History:  Past Surgical History:  Procedure Laterality Date   ABDOMINAL HYSTERECTOMY  2018   CYSTOSCOPY/URETEROSCOPY/HOLMIUM LASER/STENT PLACEMENT Right 08/19/2023   Procedure: CYSTOSCOPY/URETEROSCOPY/DILATION OF URETERAL STRICTURE/STENT PLACEMENT;  Surgeon: Roseann Adine PARAS., MD;  Location: [facility];  Service: Urology;  Laterality: Right;   DEBULKING N/A 12/26/2016   Procedure: RADICAL TUMOR DEBULKING; OMENTECTOMY;  Surgeon: Eloy Herring, MD;  Location: [facility];  Service: Gynecology;  Laterality: N/A;   ganglion cyst removed     HIATAL HERNIA REPAIR     IR FLUORO GUIDE PORT INSERTION RIGHT  01/16/2017   IR IMAGING GUIDED PORT INSERTION  03/16/2019   IR REMOVAL TUN ACCESS W/ PORT W/O FL MOD SED  06/10/2017   IR US  GUIDE VASC ACCESS RIGHT  01/16/2017   LAPAROTOMY N/A 12/26/2016   Procedure: EXPLORATORY LAPAROTOMY;  Surgeon: Eloy Herring, MD;  Location: [facility];  Service: Gynecology;  Laterality: N/A;   left morton's neuroma surgery      Nissan Fundoplication      Allergies:  Allergies  Allergen Reactions   Dilaudid [Hydromorphone Hcl] Nausea And Vomiting   Hydromorphone Other (See Comments)   Morphine  And Codeine  Nausea Only   Sudafed [Pseudoephedrine Hcl] Anxiety and Other (See Comments)    Nervous/jittery    Family History:  Family History  Problem Relation  Age of Onset   Diabetes Mother    Diabetes Father    Hypertension Father    Stroke Father    COPD Father    Heart attack Sister 69   Dementia Maternal Aunt    Bone cancer Paternal Uncle        deceased at 43   Dementia Maternal Grandmother    Heart attack Paternal Grandmother    Heart attack Paternal Grandfather    Healthy Son    Healthy Son     Social History:  Social History   Tobacco Use   Smoking status: Never    Passive exposure: Past   Smokeless tobacco: Never  Vaping Use   Vaping status: Never Used  Substance Use Topics   Alcohol use: No    Drug use: No    ROS: Constitutional:  Negative for fever, chills, weight loss CV: Negative for chest pain, previous MI, hypertension Respiratory:  Negative for shortness of breath, wheezing, sleep apnea, frequent cough GI:  Negative for nausea, vomiting, bloody stool, GERD  Physical exam: BP 117/68   Pulse 73   Ht 5' 5 (1.651 m)   Wt 152 lb (68.9 kg)   BMI 25.29 kg/m  GENERAL APPEARANCE:  Well appearing, well developed, well nourished, NAD HEENT:  Atraumatic, normocephalic, oropharynx clear NECK:  Supple without lymphadenopathy or thyromegaly ABDOMEN:  Soft, non-tender, no masses EXTREMITIES:  Moves all extremities well, without clubbing, cyanosis, or edema NEUROLOGIC:  Alert and oriented x 3, normal gait, CN II-XII grossly intact MENTAL STATUS:  appropriate BACK:  Non-tender to palpation, No CVAT SKIN:  Warm, dry, and intact  Results: U/A: 3-10 RBC, 0-5 WBC

## 2023-10-22 ENCOUNTER — Other Ambulatory Visit: Payer: Self-pay

## 2023-10-30 ENCOUNTER — Inpatient Hospital Stay

## 2023-10-30 ENCOUNTER — Inpatient Hospital Stay: Attending: Hematology and Oncology | Admitting: Hematology and Oncology

## 2023-10-30 ENCOUNTER — Encounter: Payer: Self-pay | Admitting: Hematology and Oncology

## 2023-10-30 ENCOUNTER — Other Ambulatory Visit: Payer: Self-pay

## 2023-10-30 ENCOUNTER — Other Ambulatory Visit: Payer: Self-pay | Admitting: Hematology and Oncology

## 2023-10-30 VITALS — BP 145/62 | HR 87 | Temp 98.0°F | Resp 18 | Ht 65.0 in | Wt 154.8 lb

## 2023-10-30 DIAGNOSIS — E8809 Other disorders of plasma-protein metabolism, not elsewhere classified: Secondary | ICD-10-CM

## 2023-10-30 DIAGNOSIS — C5701 Malignant neoplasm of right fallopian tube: Secondary | ICD-10-CM | POA: Insufficient documentation

## 2023-10-30 DIAGNOSIS — Z5111 Encounter for antineoplastic chemotherapy: Secondary | ICD-10-CM | POA: Diagnosis present

## 2023-10-30 DIAGNOSIS — C772 Secondary and unspecified malignant neoplasm of intra-abdominal lymph nodes: Secondary | ICD-10-CM | POA: Insufficient documentation

## 2023-10-30 DIAGNOSIS — D61818 Other pancytopenia: Secondary | ICD-10-CM

## 2023-10-30 DIAGNOSIS — R7989 Other specified abnormal findings of blood chemistry: Secondary | ICD-10-CM

## 2023-10-30 DIAGNOSIS — C787 Secondary malignant neoplasm of liver and intrahepatic bile duct: Secondary | ICD-10-CM | POA: Diagnosis not present

## 2023-10-30 DIAGNOSIS — Z7189 Other specified counseling: Secondary | ICD-10-CM

## 2023-10-30 LAB — CBC WITH DIFFERENTIAL (CANCER CENTER ONLY)
Abs Immature Granulocytes: 0.01 K/uL (ref 0.00–0.07)
Basophils Absolute: 0 K/uL (ref 0.0–0.1)
Basophils Relative: 0 %
Eosinophils Absolute: 0.1 K/uL (ref 0.0–0.5)
Eosinophils Relative: 4 %
HCT: 23.8 % — ABNORMAL LOW (ref 36.0–46.0)
Hemoglobin: 7.8 g/dL — ABNORMAL LOW (ref 12.0–15.0)
Immature Granulocytes: 0 %
Lymphocytes Relative: 24 %
Lymphs Abs: 0.6 K/uL — ABNORMAL LOW (ref 0.7–4.0)
MCH: 32.6 pg (ref 26.0–34.0)
MCHC: 32.8 g/dL (ref 30.0–36.0)
MCV: 99.6 fL (ref 80.0–100.0)
Monocytes Absolute: 0.4 K/uL (ref 0.1–1.0)
Monocytes Relative: 14 %
Neutro Abs: 1.4 K/uL — ABNORMAL LOW (ref 1.7–7.7)
Neutrophils Relative %: 58 %
Platelet Count: 110 K/uL — ABNORMAL LOW (ref 150–400)
RBC: 2.39 MIL/uL — ABNORMAL LOW (ref 3.87–5.11)
RDW: 15.9 % — ABNORMAL HIGH (ref 11.5–15.5)
WBC Count: 2.5 K/uL — ABNORMAL LOW (ref 4.0–10.5)
nRBC: 0 % (ref 0.0–0.2)

## 2023-10-30 LAB — CMP (CANCER CENTER ONLY)
ALT: 12 U/L (ref 0–44)
AST: 23 U/L (ref 15–41)
Albumin: 3.5 g/dL (ref 3.5–5.0)
Alkaline Phosphatase: 73 U/L (ref 38–126)
Anion gap: 8 (ref 5–15)
BUN: 10 mg/dL (ref 8–23)
CO2: 25 mmol/L (ref 22–32)
Calcium: 8.8 mg/dL — ABNORMAL LOW (ref 8.9–10.3)
Chloride: 106 mmol/L (ref 98–111)
Creatinine: 1.07 mg/dL — ABNORMAL HIGH (ref 0.44–1.00)
GFR, Estimated: 56 mL/min — ABNORMAL LOW (ref >60)
Glucose, Bld: 128 mg/dL — ABNORMAL HIGH (ref 70–99)
Potassium: 3.9 mmol/L (ref 3.5–5.1)
Sodium: 139 mmol/L (ref 135–145)
Total Bilirubin: 0.5 mg/dL (ref 0.0–1.2)
Total Protein: 5.9 g/dL — ABNORMAL LOW (ref 6.5–8.1)

## 2023-10-30 MED ORDER — SODIUM CHLORIDE 0.9% FLUSH
10.0000 mL | Freq: Once | INTRAVENOUS | Status: AC
  Administered 2023-10-30: 10 mL

## 2023-10-30 NOTE — Assessment & Plan Note (Addendum)
 Recommend high-protein diet Total serum protein has improved today

## 2023-10-30 NOTE — Assessment & Plan Note (Addendum)
 The patient was diagnosed with fallopian tube cancer in 2018 status post surgery and completion of adjuvant chemotherapy in 2019 She developed recurrent disease in 2020 after presentation with high-grade small bowel obstruction treated with carboplatin , Doxil  and bevacizumab  followed by bevacizumab  maintenance She had gallbladder surgery in November 2023 and adjacent lymph node came back positive for recurrent high-grade serous carcinoma and she was treated with carboplatin , paclitaxel  and bevacizumab  She had progressed on niraparib  and bevacizumab , Neg genetics CT imaging from April show metastatic disease to the liver, regional lymph nodes and pulmonary nodules  Since cycle 1 of treatment, her course was complicated by severe pancytopenia requiring changes to the treatment plan The dose of treatment is modified and she is receiving treatment every other week Despite this, she developed significant pancytopenia today We discussed rationale behind deferring treatment We discussed risk and benefits of transfusion support but due to lack of symptoms, we would defer transfusion I plan to repeat imaging study end of the month for objective assessment of response with therapy

## 2023-10-30 NOTE — Assessment & Plan Note (Addendum)
 We discussed the importance of adequate fluids on a regular basis

## 2023-10-30 NOTE — Progress Notes (Signed)
 [facility] OFFICE PROGRESS NOTE  Patient Care Team: Loreli Kins, MD as PCP - General (Family Medicine) Lonn Hicks, MD as Consulting Physician (Hematology and Oncology)  Assessment & Plan Adenocarcinoma of right fallopian tube [facility]) The patient was diagnosed with fallopian tube cancer in 2018 status post surgery and completion of adjuvant chemotherapy in 2019 She developed recurrent disease in 2020 after presentation with high-grade small bowel obstruction treated with carboplatin , Doxil  and bevacizumab  followed by bevacizumab  maintenance She had gallbladder surgery in November 2023 and adjacent lymph node came back positive for recurrent high-grade serous carcinoma and she was treated with carboplatin , paclitaxel  and bevacizumab  She had progressed on niraparib  and bevacizumab , Neg genetics CT imaging from April show metastatic disease to the liver, regional lymph nodes and pulmonary nodules  Since cycle 1 of treatment, her course was complicated by severe pancytopenia requiring changes to the treatment plan The dose of treatment is modified and she is receiving treatment every other week Despite this, she developed significant pancytopenia today We discussed rationale behind deferring treatment We discussed risk and benefits of transfusion support but due to lack of symptoms, we would defer transfusion I plan to repeat imaging study end of the month for objective assessment of response with therapy  Pancytopenia, acquired (HCC) She has intermittent pancytopenia due to treatment She is not symptomatic The cause of her anemia today is related to recent treatment and mild chronic kidney disease Due to lack of symptoms, she does not need transfusion support Proteins serum plasma low Recommend high-protein diet Total serum protein has improved today Elevated serum creatinine We discussed the importance of adequate fluids on a regular basis  Orders Placed This Encounter   Procedures   CT CHEST ABDOMEN PELVIS W CONTRAST    Standing Status:   Future    Expected Date:   11/17/2023    Expiration Date:   10/29/2024    If indicated for the ordered procedure, I authorize the administration of contrast media per Radiology protocol:   Yes    Does the patient have a contrast media/X-ray dye allergy?:   No    Preferred imaging location?:   [facility]    If indicated for the ordered procedure, I authorize the administration of oral contrast media per Radiology protocol:   No    Reason for no oral contrast::   No need oral contrast   CBC with Differential (Cancer Center Only)    Standing Status:   Future    Expected Date:   11/10/2023    Expiration Date:   11/09/2024   CMP (Cancer Center only)    Standing Status:   Future    Expected Date:   11/10/2023    Expiration Date:   11/09/2024     Hicks Lonn, MD  INTERVAL HISTORY: she returns for treatment follow-up Complications related to previous cycle of chemotherapy included pancytopenia,, fatigue,, and elevated serum creatinine She denies recent fever or chills No recent infection No recent bleeding or vaginal discharge We reviewed test results and discussed risk and benefits of deferring treatment and transfusion support We also discussed timing of imaging  PHYSICAL EXAMINATION: ECOG PERFORMANCE STATUS: 1 - Symptomatic but completely ambulatory  Lab Results  Component Value Date   CAN125 35.0 08/22/2023   CAN125 30.5 07/25/2023   CAN125 23.3 06/19/2023      Latest Ref Rng & Units 10/30/2023    8:34 AM 10/16/2023    7:42 AM 10/02/2023    8:36 AM  CBC  WBC 4.0 - 10.5 K/uL 2.5  4.0  3.8   Hemoglobin 12.0 - 15.0 g/dL 7.8  9.3  9.0   Hematocrit 36.0 - 46.0 % 23.8  28.0  27.4   Platelets 150 - 400 K/uL 110  165  110       Chemistry      Component Value Date/Time   NA 139 10/30/2023 0834   NA 139 04/14/2017 0742   K 3.9 10/30/2023 0834   K 4.0 04/14/2017 0742   CL 106 10/30/2023 0834   CO2 25  10/30/2023 0834   CO2 21 (L) 04/14/2017 0742   BUN 10 10/30/2023 0834   BUN 13.1 04/14/2017 0742   CREATININE 1.07 (H) 10/30/2023 0834   CREATININE 0.9 04/14/2017 0742      Component Value Date/Time   CALCIUM 8.8 (L) 10/30/2023 0834   CALCIUM 9.2 04/14/2017 0742   ALKPHOS 73 10/30/2023 0834   ALKPHOS 70 04/14/2017 0742   AST 23 10/30/2023 0834   AST 17 04/14/2017 0742   ALT 12 10/30/2023 0834   ALT 14 04/14/2017 0742   BILITOT 0.5 10/30/2023 0834   BILITOT 0.37 04/14/2017 0742       Vitals:   10/30/23 0916  BP: (!) 145/62  Pulse: 87  Resp: 18  Temp: 98 F (36.7 C)  SpO2: 96%   Filed Weights   10/30/23 0916  Weight: 154 lb 12.8 oz (70.2 kg)   Other relevant data reviewed during this visit included CBC and CMP

## 2023-10-30 NOTE — Assessment & Plan Note (Addendum)
 She has intermittent pancytopenia due to treatment She is not symptomatic The cause of her anemia today is related to recent treatment and mild chronic kidney disease Due to lack of symptoms, she does not need transfusion support

## 2023-10-31 LAB — CA 125: Cancer Antigen (CA) 125: 25.7 U/mL (ref 0.0–38.1)

## 2023-11-10 ENCOUNTER — Inpatient Hospital Stay

## 2023-11-10 ENCOUNTER — Other Ambulatory Visit: Payer: Self-pay | Admitting: Hematology and Oncology

## 2023-11-10 VITALS — BP 157/74 | HR 75 | Temp 97.8°F | Resp 15 | Wt 148.5 lb

## 2023-11-10 DIAGNOSIS — Z7189 Other specified counseling: Secondary | ICD-10-CM

## 2023-11-10 DIAGNOSIS — C5701 Malignant neoplasm of right fallopian tube: Secondary | ICD-10-CM

## 2023-11-10 DIAGNOSIS — Z5111 Encounter for antineoplastic chemotherapy: Secondary | ICD-10-CM | POA: Diagnosis not present

## 2023-11-10 LAB — CBC WITH DIFFERENTIAL (CANCER CENTER ONLY)
Abs Immature Granulocytes: 0.02 K/uL (ref 0.00–0.07)
Basophils Absolute: 0 K/uL (ref 0.0–0.1)
Basophils Relative: 1 %
Eosinophils Absolute: 0.1 K/uL (ref 0.0–0.5)
Eosinophils Relative: 2 %
HCT: 29 % — ABNORMAL LOW (ref 36.0–46.0)
Hemoglobin: 9.4 g/dL — ABNORMAL LOW (ref 12.0–15.0)
Immature Granulocytes: 1 %
Lymphocytes Relative: 35 %
Lymphs Abs: 1.1 K/uL (ref 0.7–4.0)
MCH: 31.5 pg (ref 26.0–34.0)
MCHC: 32.4 g/dL (ref 30.0–36.0)
MCV: 97.3 fL (ref 80.0–100.0)
Monocytes Absolute: 0.5 K/uL (ref 0.1–1.0)
Monocytes Relative: 18 %
Neutro Abs: 1.3 K/uL — ABNORMAL LOW (ref 1.7–7.7)
Neutrophils Relative %: 43 %
Platelet Count: 174 K/uL (ref 150–400)
RBC: 2.98 MIL/uL — ABNORMAL LOW (ref 3.87–5.11)
RDW: 16.4 % — ABNORMAL HIGH (ref 11.5–15.5)
WBC Count: 3 K/uL — ABNORMAL LOW (ref 4.0–10.5)
nRBC: 0 % (ref 0.0–0.2)

## 2023-11-10 LAB — CMP (CANCER CENTER ONLY)
ALT: 6 U/L (ref 0–44)
AST: 18 U/L (ref 15–41)
Albumin: 3.8 g/dL (ref 3.5–5.0)
Alkaline Phosphatase: 75 U/L (ref 38–126)
Anion gap: 7 (ref 5–15)
BUN: 16 mg/dL (ref 8–23)
CO2: 26 mmol/L (ref 22–32)
Calcium: 9.2 mg/dL (ref 8.9–10.3)
Chloride: 107 mmol/L (ref 98–111)
Creatinine: 1.05 mg/dL — ABNORMAL HIGH (ref 0.44–1.00)
GFR, Estimated: 58 mL/min — ABNORMAL LOW (ref >60)
Glucose, Bld: 90 mg/dL (ref 70–99)
Potassium: 4 mmol/L (ref 3.5–5.1)
Sodium: 140 mmol/L (ref 135–145)
Total Bilirubin: 0.6 mg/dL (ref 0.0–1.2)
Total Protein: 6.3 g/dL — ABNORMAL LOW (ref 6.5–8.1)

## 2023-11-10 MED ORDER — PROCHLORPERAZINE MALEATE 10 MG PO TABS
10.0000 mg | ORAL_TABLET | Freq: Once | ORAL | Status: AC
Start: 2023-11-10 — End: 2023-11-10
  Administered 2023-11-10: 10 mg via ORAL
  Filled 2023-11-10: qty 1

## 2023-11-10 MED ORDER — SODIUM CHLORIDE 0.9 % IV SOLN: INTRAVENOUS | Status: DC

## 2023-11-10 MED ORDER — SODIUM CHLORIDE 0.9% FLUSH
10.0000 mL | Freq: Once | INTRAVENOUS | Status: AC
  Administered 2023-11-10: 10 mL

## 2023-11-10 MED ORDER — HEPARIN SOD (PORK) LOCK FLUSH 100 UNIT/ML IV SOLN
500.0000 [IU] | Freq: Once | INTRAVENOUS | Status: DC | PRN
Start: 2023-11-10 — End: 2023-11-10

## 2023-11-10 MED ORDER — SODIUM CHLORIDE 0.9% FLUSH: 10.0000 mL | INTRAVENOUS | Status: DC | PRN

## 2023-11-10 MED ORDER — SODIUM CHLORIDE 0.9 % IV SOLN
800.0000 mg/m2 | Freq: Once | INTRAVENOUS | Status: AC
  Administered 2023-11-10: 1406 mg via INTRAVENOUS
  Filled 2023-11-10: qty 36.98

## 2023-11-10 NOTE — Patient Instructions (Signed)

## 2023-11-17 ENCOUNTER — Ambulatory Visit (HOSPITAL_BASED_OUTPATIENT_CLINIC_OR_DEPARTMENT_OTHER)
Admission: RE | Admit: 2023-11-17 | Discharge: 2023-11-17 | Disposition: A | Discharge status: Home / Self Care | Source: Ambulatory Visit | Attending: Hematology and Oncology | Admitting: Hematology and Oncology

## 2023-11-17 DIAGNOSIS — C5701 Malignant neoplasm of right fallopian tube: Secondary | ICD-10-CM | POA: Diagnosis present

## 2023-11-17 MED ORDER — HEPARIN SOD (PORK) LOCK FLUSH 100 UNIT/ML IV SOLN: 500.0000 [IU] | Freq: Once | INTRAVENOUS | Status: DC

## 2023-11-17 MED ORDER — IOHEXOL 300 MG/ML  SOLN
100.0000 mL | Freq: Once | INTRAMUSCULAR | Status: AC | PRN
  Administered 2023-11-17: 100 mL via INTRAVENOUS

## 2023-11-17 MED ORDER — SODIUM CHLORIDE 0.9% FLUSH: 10.0000 mL | INTRAVENOUS | Status: DC | PRN

## 2023-11-24 ENCOUNTER — Inpatient Hospital Stay: Attending: Hematology and Oncology | Admitting: Hematology and Oncology

## 2023-11-24 ENCOUNTER — Encounter: Payer: Self-pay | Admitting: Hematology and Oncology

## 2023-11-24 ENCOUNTER — Telehealth: Payer: Self-pay | Admitting: Urology

## 2023-11-24 VITALS — BP 146/76 | HR 89 | Temp 97.9°F | Resp 18 | Ht 65.0 in | Wt 148.8 lb

## 2023-11-24 DIAGNOSIS — K5909 Other constipation: Secondary | ICD-10-CM | POA: Diagnosis not present

## 2023-11-24 DIAGNOSIS — T451X5A Adverse effect of antineoplastic and immunosuppressive drugs, initial encounter: Secondary | ICD-10-CM | POA: Insufficient documentation

## 2023-11-24 DIAGNOSIS — N1330 Unspecified hydronephrosis: Secondary | ICD-10-CM | POA: Diagnosis not present

## 2023-11-24 DIAGNOSIS — Z7189 Other specified counseling: Secondary | ICD-10-CM

## 2023-11-24 DIAGNOSIS — C5701 Malignant neoplasm of right fallopian tube: Secondary | ICD-10-CM | POA: Diagnosis present

## 2023-11-24 DIAGNOSIS — Z5111 Encounter for antineoplastic chemotherapy: Secondary | ICD-10-CM | POA: Insufficient documentation

## 2023-11-24 DIAGNOSIS — C787 Secondary malignant neoplasm of liver and intrahepatic bile duct: Secondary | ICD-10-CM | POA: Insufficient documentation

## 2023-11-24 DIAGNOSIS — D61810 Antineoplastic chemotherapy induced pancytopenia: Secondary | ICD-10-CM | POA: Diagnosis not present

## 2023-11-24 NOTE — Assessment & Plan Note (Addendum)
I recommend aggressive laxative therapy

## 2023-11-24 NOTE — Assessment & Plan Note (Addendum)
 The patient was diagnosed with fallopian tube cancer in 2018 status post surgery and completion of adjuvant chemotherapy in 2019 She developed recurrent disease in 2020 after presentation with high-grade small bowel obstruction treated with carboplatin , Doxil  and bevacizumab  followed by bevacizumab  maintenance She had gallbladder surgery in November 2023 and adjacent lymph node came back positive for recurrent high-grade serous carcinoma and she was treated with carboplatin , paclitaxel  and bevacizumab  She had progressed on niraparib  and bevacizumab , Neg genetics CT imaging from April show metastatic disease to the liver, regional lymph nodes and pulmonary nodules  Since cycle 1 of treatment, her course was complicated by severe pancytopenia requiring changes to the treatment plan The dose of treatment is modified and she is receiving treatment every other week CT imaging from July 2025 was compared with April 2025 which showed stable disease control Her tumor marker is also stable Overall, she tolerated treatment well We discussed plan of care to move forward with 3 more cycles of treatment Due to her plan to travel, we will resume chemotherapy next week Due to significant dose modifications in the past, I plan to only schedule 2 appointments at a time

## 2023-11-24 NOTE — Progress Notes (Signed)
 [facility] OFFICE PROGRESS NOTE  Patient Care Team: Loreli Kins, MD as PCP - General (Family Medicine) Lonn Hicks, MD as Consulting Physician (Hematology and Oncology)  Assessment & Plan Adenocarcinoma of right fallopian tube [facility]) The patient was diagnosed with fallopian tube cancer in 2018 status post surgery and completion of adjuvant chemotherapy in 2019 She developed recurrent disease in 2020 after presentation with high-grade small bowel obstruction treated with carboplatin , Doxil  and bevacizumab  followed by bevacizumab  maintenance She had gallbladder surgery in November 2023 and adjacent lymph node came back positive for recurrent high-grade serous carcinoma and she was treated with carboplatin , paclitaxel  and bevacizumab  She had progressed on niraparib  and bevacizumab , Neg genetics CT imaging from April show metastatic disease to the liver, regional lymph nodes and pulmonary nodules  Since cycle 1 of treatment, her course was complicated by severe pancytopenia requiring changes to the treatment plan The dose of treatment is modified and she is receiving treatment every other week CT imaging from July 2025 was compared with April 2025 which showed stable disease control Her tumor marker is also stable Overall, she tolerated treatment well We discussed plan of care to move forward with 3 more cycles of treatment Due to her plan to travel, we will resume chemotherapy next week Due to significant dose modifications in the past, I plan to only schedule 2 appointments at a time  Other constipation I recommend aggressive laxative therapy Hydronephrosis of right kidney She had stents placed and hydronephrosis is much improved Her renal function has improved She will continue stent exchange with urologist every 3 months  Orders Placed This Encounter  Procedures   CBC with Differential (Cancer Center Only)    Standing Status:   Future    Expected Date:   12/05/2023     Expiration Date:   12/04/2024   CMP (Cancer Center only)    Standing Status:   Future    Expected Date:   12/05/2023    Expiration Date:   12/04/2024   CBC with Differential (Cancer Center Only)    Standing Status:   Future    Expected Date:   12/19/2023    Expiration Date:   12/18/2024   CMP (Cancer Center only)    Standing Status:   Future    Expected Date:   12/19/2023    Expiration Date:   12/18/2024     Hicks Lonn, MD  INTERVAL HISTORY: she returns for treatment follow-up Complications related to previous cycle of chemotherapy included pancytopenia, and constipation,  PHYSICAL EXAMINATION: ECOG PERFORMANCE STATUS: 1 - Symptomatic but completely ambulatory  Lab Results  Component Value Date   CAN125 25.7 10/30/2023   CAN125 35.0 08/22/2023   CAN125 30.5 07/25/2023      Latest Ref Rng & Units 11/10/2023    7:50 AM 10/30/2023    8:34 AM 10/16/2023    7:42 AM  CBC  WBC 4.0 - 10.5 K/uL 3.0  2.5  4.0   Hemoglobin 12.0 - 15.0 g/dL 9.4  7.8  9.3   Hematocrit 36.0 - 46.0 % 29.0  23.8  28.0   Platelets 150 - 400 K/uL 174  110  165       Chemistry      Component Value Date/Time   NA 140 11/10/2023 0750   NA 139 04/14/2017 0742   K 4.0 11/10/2023 0750   K 4.0 04/14/2017 0742   CL 107 11/10/2023 0750   CO2 26 11/10/2023 0750   CO2 21 (L) 04/14/2017  0742   BUN 16 11/10/2023 0750   BUN 13.1 04/14/2017 0742   CREATININE 1.05 (H) 11/10/2023 0750   CREATININE 0.9 04/14/2017 0742      Component Value Date/Time   CALCIUM 9.2 11/10/2023 0750   CALCIUM 9.2 04/14/2017 0742   ALKPHOS 75 11/10/2023 0750   ALKPHOS 70 04/14/2017 0742   AST 18 11/10/2023 0750   AST 17 04/14/2017 0742   ALT 6 11/10/2023 0750   ALT 14 04/14/2017 0742   BILITOT 0.6 11/10/2023 0750   BILITOT 0.37 04/14/2017 0742       Vitals:   11/24/23 0813  BP: (!) 146/76  Pulse: 89  Resp: 18  Temp: 97.9 F (36.6 C)  SpO2: 95%   Filed Weights   11/24/23 0813  Weight: 148 lb 12.8 oz (67.5 kg)    Other relevant data reviewed during this visit included CBC, CMP, CA125, CT imaging from July 2025 and April 2025

## 2023-11-24 NOTE — Assessment & Plan Note (Addendum)
 She had stents placed and hydronephrosis is much improved Her renal function has improved She will continue stent exchange with urologist every 3 months

## 2023-11-24 NOTE — Telephone Encounter (Signed)
 Pt called and wanted to make Dr. Roseann aware that her CT results are in and wants to know what the next steps need to be. Please advise. Thank You.

## 2023-11-25 ENCOUNTER — Encounter: Payer: Self-pay | Admitting: Urology

## 2023-12-05 ENCOUNTER — Inpatient Hospital Stay

## 2023-12-05 ENCOUNTER — Encounter: Payer: Self-pay | Admitting: Hematology and Oncology

## 2023-12-05 VITALS — BP 160/74 | HR 68 | Temp 97.8°F | Resp 18

## 2023-12-05 DIAGNOSIS — C5701 Malignant neoplasm of right fallopian tube: Secondary | ICD-10-CM

## 2023-12-05 DIAGNOSIS — Z7189 Other specified counseling: Secondary | ICD-10-CM

## 2023-12-05 DIAGNOSIS — Z5111 Encounter for antineoplastic chemotherapy: Secondary | ICD-10-CM | POA: Diagnosis not present

## 2023-12-05 LAB — CMP (CANCER CENTER ONLY)
ALT: 8 U/L (ref 0–44)
AST: 18 U/L (ref 15–41)
Albumin: 3.7 g/dL (ref 3.5–5.0)
Alkaline Phosphatase: 67 U/L (ref 38–126)
Anion gap: 9 (ref 5–15)
BUN: 11 mg/dL (ref 8–23)
CO2: 26 mmol/L (ref 22–32)
Calcium: 8.8 mg/dL — ABNORMAL LOW (ref 8.9–10.3)
Chloride: 103 mmol/L (ref 98–111)
Creatinine: 1.03 mg/dL — ABNORMAL HIGH (ref 0.44–1.00)
GFR, Estimated: 59 mL/min — ABNORMAL LOW (ref >60)
Glucose, Bld: 131 mg/dL — ABNORMAL HIGH (ref 70–99)
Potassium: 3.8 mmol/L (ref 3.5–5.1)
Sodium: 138 mmol/L (ref 135–145)
Total Bilirubin: 0.4 mg/dL (ref 0.0–1.2)
Total Protein: 6 g/dL — ABNORMAL LOW (ref 6.5–8.1)

## 2023-12-05 LAB — CBC WITH DIFFERENTIAL (CANCER CENTER ONLY)
Abs Immature Granulocytes: 0.01 K/uL (ref 0.00–0.07)
Basophils Absolute: 0 K/uL (ref 0.0–0.1)
Basophils Relative: 1 %
Eosinophils Absolute: 0.2 K/uL (ref 0.0–0.5)
Eosinophils Relative: 5 %
HCT: 30.8 % — ABNORMAL LOW (ref 36.0–46.0)
Hemoglobin: 10.2 g/dL — ABNORMAL LOW (ref 12.0–15.0)
Immature Granulocytes: 0 %
Lymphocytes Relative: 28 %
Lymphs Abs: 0.9 K/uL (ref 0.7–4.0)
MCH: 32.1 pg (ref 26.0–34.0)
MCHC: 33.1 g/dL (ref 30.0–36.0)
MCV: 96.9 fL (ref 80.0–100.0)
Monocytes Absolute: 0.5 K/uL (ref 0.1–1.0)
Monocytes Relative: 15 %
Neutro Abs: 1.7 K/uL (ref 1.7–7.7)
Neutrophils Relative %: 51 %
Platelet Count: 194 K/uL (ref 150–400)
RBC: 3.18 MIL/uL — ABNORMAL LOW (ref 3.87–5.11)
RDW: 15.1 % (ref 11.5–15.5)
WBC Count: 3.4 K/uL — ABNORMAL LOW (ref 4.0–10.5)
nRBC: 0 % (ref 0.0–0.2)

## 2023-12-05 MED ORDER — APREPITANT 130 MG/18ML IV EMUL
130.0000 mg | Freq: Once | INTRAVENOUS | Status: AC
  Administered 2023-12-05: 130 mg via INTRAVENOUS
  Filled 2023-12-05: qty 18

## 2023-12-05 MED ORDER — DEXAMETHASONE SODIUM PHOSPHATE 10 MG/ML IJ SOLN
10.0000 mg | Freq: Once | INTRAMUSCULAR | Status: AC
  Administered 2023-12-05: 10 mg via INTRAVENOUS
  Filled 2023-12-05: qty 1

## 2023-12-05 MED ORDER — SODIUM CHLORIDE 0.9 % IV SOLN: INTRAVENOUS | Status: DC

## 2023-12-05 MED ORDER — SODIUM CHLORIDE 0.9% FLUSH
10.0000 mL | Freq: Once | INTRAVENOUS | Status: AC
Start: 2023-12-05 — End: 2023-12-05
  Administered 2023-12-05: 10 mL

## 2023-12-05 MED ORDER — SODIUM CHLORIDE 0.9 % IV SOLN
314.8000 mg | Freq: Once | INTRAVENOUS | Status: AC
  Administered 2023-12-05: 310 mg via INTRAVENOUS
  Filled 2023-12-05: qty 31

## 2023-12-05 MED ORDER — SODIUM CHLORIDE 0.9% FLUSH: 10.0000 mL | INTRAVENOUS | Status: DC | PRN

## 2023-12-05 MED ORDER — FAMOTIDINE IN NACL 20-0.9 MG/50ML-% IV SOLN
20.0000 mg | Freq: Once | INTRAVENOUS | Status: AC
  Administered 2023-12-05: 20 mg via INTRAVENOUS
  Filled 2023-12-05: qty 50

## 2023-12-05 MED ORDER — SODIUM CHLORIDE 0.9 % IV SOLN
800.0000 mg/m2 | Freq: Once | INTRAVENOUS | Status: AC
  Administered 2023-12-05: 1406 mg via INTRAVENOUS
  Filled 2023-12-05: qty 36.98

## 2023-12-05 MED ORDER — CETIRIZINE HCL 10 MG/ML IV SOLN
10.0000 mg | Freq: Once | INTRAVENOUS | Status: AC
  Administered 2023-12-05: 10 mg via INTRAVENOUS
  Filled 2023-12-05: qty 1

## 2023-12-05 MED ORDER — PALONOSETRON HCL INJECTION 0.25 MG/5ML
0.2500 mg | Freq: Once | INTRAVENOUS | Status: AC
  Administered 2023-12-05: 0.25 mg via INTRAVENOUS
  Filled 2023-12-05: qty 5

## 2023-12-05 NOTE — Patient Instructions (Signed)
 [facility] - A DEPT OF [place]  Discharge Instructions: Thank you for choosing [facility] to provide your oncology and hematology care.   If you have a lab appointment with the Cancer Center, please go directly to the Cancer Center and check in at the registration area.   Wear comfortable clothing and clothing appropriate for easy access to any Portacath or PICC line.   We strive to give you quality time with your provider. You may need to reschedule your appointment if you arrive late (15 or more minutes).  Arriving late affects you and other patients whose appointments are after yours.  Also, if you miss three or more appointments without notifying the office, you may be dismissed from the clinic at the provider's discretion.      For prescription refill requests, have your pharmacy contact our office and allow 72 hours for refills to be completed.    Today you received the following chemotherapy and/or immunotherapy agents: gemzar, carboplatin      To help prevent nausea and vomiting after your treatment, we encourage you to take your nausea medication as directed.  BELOW ARE SYMPTOMS THAT SHOULD BE REPORTED IMMEDIATELY: *FEVER GREATER THAN 100.4 F (38 C) OR HIGHER *CHILLS OR SWEATING *NAUSEA AND VOMITING THAT IS NOT CONTROLLED WITH YOUR NAUSEA MEDICATION *UNUSUAL SHORTNESS OF BREATH *UNUSUAL BRUISING OR BLEEDING *URINARY PROBLEMS (pain or burning when urinating, or frequent urination) *BOWEL PROBLEMS (unusual diarrhea, constipation, pain near the anus) TENDERNESS IN MOUTH AND THROAT WITH OR WITHOUT PRESENCE OF ULCERS (sore throat, sores in mouth, or a toothache) UNUSUAL RASH, SWELLING OR PAIN  UNUSUAL VAGINAL DISCHARGE OR ITCHING   Items with * indicate a potential emergency and should be followed up as soon as possible or go to the Emergency Department if any problems should occur.  Please show the CHEMOTHERAPY ALERT CARD or  IMMUNOTHERAPY ALERT CARD at check-in to the Emergency Department and triage nurse.  Should you have questions after your visit or need to cancel or reschedule your appointment, please contact [facility] - A DEPT OF [facility]  Dept: [phone]  and follow the prompts.  Office hours are 8:00 a.m. to 4:30 p.m. Monday - Friday. Please note that voicemails left after 4:00 p.m. may not be returned until the following business day.  We are closed weekends and major holidays. You have access to a nurse at all times for urgent questions. Please call the main number to the clinic Dept: [phone] and follow the prompts.   For any non-urgent questions, you may also contact your provider using MyChart. We now offer e-Visits for anyone 74 and older to request care online for non-urgent symptoms. For details visit [email or web address].   Also download the MyChart app! Go to the app store, search MyChart, open the app, select [place], and log in with your MyChart username and password.

## 2023-12-06 LAB — CA 125: Cancer Antigen (CA) 125: 25.9 U/mL (ref 0.0–38.1)

## 2023-12-19 ENCOUNTER — Inpatient Hospital Stay

## 2023-12-19 ENCOUNTER — Other Ambulatory Visit: Payer: Self-pay | Admitting: Hematology and Oncology

## 2023-12-19 ENCOUNTER — Encounter: Payer: Self-pay | Admitting: Hematology and Oncology

## 2023-12-19 ENCOUNTER — Inpatient Hospital Stay (HOSPITAL_BASED_OUTPATIENT_CLINIC_OR_DEPARTMENT_OTHER): Admitting: Hematology and Oncology

## 2023-12-19 VITALS — BP 125/73 | HR 77 | Temp 97.6°F | Resp 18 | Ht 65.0 in | Wt 149.8 lb

## 2023-12-19 DIAGNOSIS — D61818 Other pancytopenia: Secondary | ICD-10-CM | POA: Diagnosis not present

## 2023-12-19 DIAGNOSIS — C5701 Malignant neoplasm of right fallopian tube: Secondary | ICD-10-CM

## 2023-12-19 DIAGNOSIS — Z7189 Other specified counseling: Secondary | ICD-10-CM

## 2023-12-19 DIAGNOSIS — Z5111 Encounter for antineoplastic chemotherapy: Secondary | ICD-10-CM | POA: Diagnosis not present

## 2023-12-19 LAB — CBC WITH DIFFERENTIAL (CANCER CENTER ONLY)
Abs Immature Granulocytes: 0 K/uL (ref 0.00–0.07)
Basophils Absolute: 0 K/uL (ref 0.0–0.1)
Basophils Relative: 1 %
Eosinophils Absolute: 0.1 K/uL (ref 0.0–0.5)
Eosinophils Relative: 4 %
HCT: 28.7 % — ABNORMAL LOW (ref 36.0–46.0)
Hemoglobin: 9.4 g/dL — ABNORMAL LOW (ref 12.0–15.0)
Immature Granulocytes: 0 %
Lymphocytes Relative: 28 %
Lymphs Abs: 0.7 K/uL (ref 0.7–4.0)
MCH: 30.9 pg (ref 26.0–34.0)
MCHC: 32.8 g/dL (ref 30.0–36.0)
MCV: 94.4 fL (ref 80.0–100.0)
Monocytes Absolute: 0.5 K/uL (ref 0.1–1.0)
Monocytes Relative: 18 %
Neutro Abs: 1.2 K/uL — ABNORMAL LOW (ref 1.7–7.7)
Neutrophils Relative %: 49 %
Platelet Count: 96 K/uL — ABNORMAL LOW (ref 150–400)
RBC: 3.04 MIL/uL — ABNORMAL LOW (ref 3.87–5.11)
RDW: 14.8 % (ref 11.5–15.5)
WBC Count: 2.5 K/uL — ABNORMAL LOW (ref 4.0–10.5)
nRBC: 0 % (ref 0.0–0.2)

## 2023-12-19 LAB — CMP (CANCER CENTER ONLY)
ALT: 9 U/L (ref 0–44)
AST: 19 U/L (ref 15–41)
Albumin: 3.5 g/dL (ref 3.5–5.0)
Alkaline Phosphatase: 74 U/L (ref 38–126)
Anion gap: 7 (ref 5–15)
BUN: 10 mg/dL (ref 8–23)
CO2: 26 mmol/L (ref 22–32)
Calcium: 8.8 mg/dL — ABNORMAL LOW (ref 8.9–10.3)
Chloride: 103 mmol/L (ref 98–111)
Creatinine: 1.09 mg/dL — ABNORMAL HIGH (ref 0.44–1.00)
GFR, Estimated: 55 mL/min — ABNORMAL LOW (ref >60)
Glucose, Bld: 125 mg/dL — ABNORMAL HIGH (ref 70–99)
Potassium: 3.7 mmol/L (ref 3.5–5.1)
Sodium: 136 mmol/L (ref 135–145)
Total Bilirubin: 0.4 mg/dL (ref 0.0–1.2)
Total Protein: 6.1 g/dL — ABNORMAL LOW (ref 6.5–8.1)

## 2023-12-19 MED ORDER — SODIUM CHLORIDE 0.9 % IV SOLN
800.0000 mg/m2 | Freq: Once | INTRAVENOUS | Status: AC
  Administered 2023-12-19: 1406 mg via INTRAVENOUS
  Filled 2023-12-19: qty 36.98

## 2023-12-19 MED ORDER — PROCHLORPERAZINE MALEATE 10 MG PO TABS
10.0000 mg | ORAL_TABLET | Freq: Once | ORAL | Status: AC
  Administered 2023-12-19: 10 mg via ORAL
  Filled 2023-12-19: qty 1

## 2023-12-19 MED ORDER — SODIUM CHLORIDE 0.9% FLUSH
10.0000 mL | Freq: Once | INTRAVENOUS | Status: AC
  Administered 2023-12-19: 10 mL

## 2023-12-19 MED ORDER — SODIUM CHLORIDE 0.9% FLUSH: 10.0000 mL | INTRAVENOUS | Status: DC | PRN

## 2023-12-19 MED ORDER — SODIUM CHLORIDE 0.9 % IV SOLN: INTRAVENOUS | Status: DC

## 2023-12-19 NOTE — Progress Notes (Signed)
 [facility] OFFICE PROGRESS NOTE  Patient Care Team: Loreli Kins, MD as PCP - General (Family Medicine) Lonn Hicks, MD as Consulting Physician (Hematology and Oncology)  Assessment & Plan Adenocarcinoma of right fallopian tube [facility]) The patient was diagnosed with fallopian tube cancer in 2018 status post surgery and completion of adjuvant chemotherapy in 2019 She developed recurrent disease in 2020 after presentation with high-grade small bowel obstruction treated with carboplatin , Doxil  and bevacizumab  followed by bevacizumab  maintenance She had gallbladder surgery in November 2023 and adjacent lymph node came back positive for recurrent high-grade serous carcinoma and she was treated with carboplatin , paclitaxel  and bevacizumab  She had progressed on niraparib  and bevacizumab , Neg genetics CT imaging from April show metastatic disease to the liver, regional lymph nodes and pulmonary nodules  Since cycle 1 of treatment, her course was complicated by severe pancytopenia requiring changes to the treatment plan The dose of treatment is modified and she is receiving treatment every other week CT imaging from July 2025 was compared with April 2025 which showed stable disease control Her tumor marker is also stable Overall, she tolerated treatment well except for intermittent severe pancytopenia We discussed risk and benefits of pursuing treatment today with pancytopenia and she is in agreement Moving forward, I will give her 2 weeks of break after today's treatment Moving forward, her treatment cycle will be modified to a 5-week cycle with day 1 and day 22 instead of day 1 and day 8 of every cycle I plan to repeat imaging study again at the end of the year Pancytopenia, acquired [facility]) She has intermittent pancytopenia due to treatment She is not symptomatic We discussed risk and benefits of pursuing treatment and she is in agreement to proceed without delay  No orders of  the defined types were placed in this encounter.    Hicks Lonn, MD  INTERVAL HISTORY: she returns for treatment follow-up Complications related to previous cycle of chemotherapy included pancytopenia,  PHYSICAL EXAMINATION: ECOG PERFORMANCE STATUS: 1 - Symptomatic but completely ambulatory  Lab Results  Component Value Date   CAN125 25.9 12/05/2023   CAN125 25.7 10/30/2023   CAN125 35.0 08/22/2023      Latest Ref Rng & Units 12/19/2023    8:10 AM 12/05/2023    7:31 AM 11/10/2023    7:50 AM  CBC  WBC 4.0 - 10.5 K/uL 2.5  3.4  3.0   Hemoglobin 12.0 - 15.0 g/dL 9.4  89.7  9.4   Hematocrit 36.0 - 46.0 % 28.7  30.8  29.0   Platelets 150 - 400 K/uL 96  194  174       Chemistry      Component Value Date/Time   NA 136 12/19/2023 0810   NA 139 04/14/2017 0742   K 3.7 12/19/2023 0810   K 4.0 04/14/2017 0742   CL 103 12/19/2023 0810   CO2 26 12/19/2023 0810   CO2 21 (L) 04/14/2017 0742   BUN 10 12/19/2023 0810   BUN 13.1 04/14/2017 0742   CREATININE 1.09 (H) 12/19/2023 0810   CREATININE 0.9 04/14/2017 0742      Component Value Date/Time   CALCIUM 8.8 (L) 12/19/2023 0810   CALCIUM 9.2 04/14/2017 0742   ALKPHOS 74 12/19/2023 0810   ALKPHOS 70 04/14/2017 0742   AST 19 12/19/2023 0810   AST 17 04/14/2017 0742   ALT 9 12/19/2023 0810   ALT 14 04/14/2017 0742   BILITOT 0.4 12/19/2023 0810   BILITOT 0.37 04/14/2017 0742  Vitals:   12/19/23 0902  BP: 125/73  Pulse: 77  Resp: 18  Temp: 97.6 F (36.4 C)  SpO2: 98%   Filed Weights   12/19/23 0902  Weight: 149 lb 12.8 oz (67.9 kg)   Other relevant data reviewed during this visit included CBC and CMP

## 2023-12-19 NOTE — Assessment & Plan Note (Addendum)
 The patient was diagnosed with fallopian tube cancer in 2018 status post surgery and completion of adjuvant chemotherapy in 2019 She developed recurrent disease in 2020 after presentation with high-grade small bowel obstruction treated with carboplatin , Doxil  and bevacizumab  followed by bevacizumab  maintenance She had gallbladder surgery in November 2023 and adjacent lymph node came back positive for recurrent high-grade serous carcinoma and she was treated with carboplatin , paclitaxel  and bevacizumab  She had progressed on niraparib  and bevacizumab , Neg genetics CT imaging from April show metastatic disease to the liver, regional lymph nodes and pulmonary nodules  Since cycle 1 of treatment, her course was complicated by severe pancytopenia requiring changes to the treatment plan The dose of treatment is modified and she is receiving treatment every other week CT imaging from July 2025 was compared with April 2025 which showed stable disease control Her tumor marker is also stable Overall, she tolerated treatment well except for intermittent severe pancytopenia We discussed risk and benefits of pursuing treatment today with pancytopenia and she is in agreement Moving forward, I will give her 2 weeks of break after today's treatment Moving forward, her treatment cycle will be modified to a 5-week cycle with day 1 and day 22 instead of day 1 and day 8 of every cycle I plan to repeat imaging study again at the end of the year

## 2023-12-19 NOTE — Assessment & Plan Note (Addendum)
 She has intermittent pancytopenia due to treatment She is not symptomatic We discussed risk and benefits of pursuing treatment and she is in agreement to proceed without delay

## 2023-12-20 ENCOUNTER — Other Ambulatory Visit: Payer: Self-pay

## 2023-12-20 LAB — CA 125: Cancer Antigen (CA) 125: 29.2 U/mL (ref 0.0–38.1)

## 2023-12-28 ENCOUNTER — Other Ambulatory Visit: Payer: Self-pay

## 2024-01-09 ENCOUNTER — Inpatient Hospital Stay (HOSPITAL_BASED_OUTPATIENT_CLINIC_OR_DEPARTMENT_OTHER): Admitting: Hematology and Oncology

## 2024-01-09 ENCOUNTER — Inpatient Hospital Stay

## 2024-01-09 ENCOUNTER — Encounter: Payer: Self-pay | Admitting: Hematology and Oncology

## 2024-01-09 ENCOUNTER — Inpatient Hospital Stay: Attending: Hematology and Oncology

## 2024-01-09 VITALS — BP 169/82 | HR 86 | Temp 97.7°F | Resp 18 | Ht 65.0 in | Wt 150.0 lb

## 2024-01-09 DIAGNOSIS — C787 Secondary malignant neoplasm of liver and intrahepatic bile duct: Secondary | ICD-10-CM | POA: Diagnosis not present

## 2024-01-09 DIAGNOSIS — Z5111 Encounter for antineoplastic chemotherapy: Secondary | ICD-10-CM | POA: Diagnosis present

## 2024-01-09 DIAGNOSIS — D61818 Other pancytopenia: Secondary | ICD-10-CM

## 2024-01-09 DIAGNOSIS — Z79631 Long term (current) use of antimetabolite agent: Secondary | ICD-10-CM | POA: Diagnosis not present

## 2024-01-09 DIAGNOSIS — Z7189 Other specified counseling: Secondary | ICD-10-CM

## 2024-01-09 DIAGNOSIS — C5701 Malignant neoplasm of right fallopian tube: Secondary | ICD-10-CM | POA: Insufficient documentation

## 2024-01-09 DIAGNOSIS — C7800 Secondary malignant neoplasm of unspecified lung: Secondary | ICD-10-CM | POA: Insufficient documentation

## 2024-01-09 DIAGNOSIS — I10 Essential (primary) hypertension: Secondary | ICD-10-CM

## 2024-01-09 LAB — CBC WITH DIFFERENTIAL (CANCER CENTER ONLY)
Abs Immature Granulocytes: 0.02 K/uL (ref 0.00–0.07)
Basophils Absolute: 0 K/uL (ref 0.0–0.1)
Basophils Relative: 1 %
Eosinophils Absolute: 0.1 K/uL (ref 0.0–0.5)
Eosinophils Relative: 2 %
HCT: 31.5 % — ABNORMAL LOW (ref 36.0–46.0)
Hemoglobin: 10.4 g/dL — ABNORMAL LOW (ref 12.0–15.0)
Immature Granulocytes: 1 %
Lymphocytes Relative: 21 %
Lymphs Abs: 0.8 K/uL (ref 0.7–4.0)
MCH: 31.5 pg (ref 26.0–34.0)
MCHC: 33 g/dL (ref 30.0–36.0)
MCV: 95.5 fL (ref 80.0–100.0)
Monocytes Absolute: 0.7 K/uL (ref 0.1–1.0)
Monocytes Relative: 19 %
Neutro Abs: 2.1 K/uL (ref 1.7–7.7)
Neutrophils Relative %: 56 %
Platelet Count: 222 K/uL (ref 150–400)
RBC: 3.3 MIL/uL — ABNORMAL LOW (ref 3.87–5.11)
RDW: 16.7 % — ABNORMAL HIGH (ref 11.5–15.5)
WBC Count: 3.7 K/uL — ABNORMAL LOW (ref 4.0–10.5)
nRBC: 0 % (ref 0.0–0.2)

## 2024-01-09 LAB — CMP (CANCER CENTER ONLY)
ALT: 7 U/L (ref 0–44)
AST: 19 U/L (ref 15–41)
Albumin: 3.7 g/dL (ref 3.5–5.0)
Alkaline Phosphatase: 74 U/L (ref 38–126)
Anion gap: 6 (ref 5–15)
BUN: 10 mg/dL (ref 8–23)
CO2: 28 mmol/L (ref 22–32)
Calcium: 8.9 mg/dL (ref 8.9–10.3)
Chloride: 104 mmol/L (ref 98–111)
Creatinine: 1.02 mg/dL — ABNORMAL HIGH (ref 0.44–1.00)
GFR, Estimated: 60 mL/min — ABNORMAL LOW (ref >60)
Glucose, Bld: 112 mg/dL — ABNORMAL HIGH (ref 70–99)
Potassium: 3.9 mmol/L (ref 3.5–5.1)
Sodium: 138 mmol/L (ref 135–145)
Total Bilirubin: 0.4 mg/dL (ref 0.0–1.2)
Total Protein: 6.2 g/dL — ABNORMAL LOW (ref 6.5–8.1)

## 2024-01-09 MED ORDER — SODIUM CHLORIDE 0.9 % IV SOLN
800.0000 mg/m2 | Freq: Once | INTRAVENOUS | Status: AC
  Administered 2024-01-09: 1406 mg via INTRAVENOUS
  Filled 2024-01-09: qty 36.98

## 2024-01-09 MED ORDER — SODIUM CHLORIDE 0.9 % IV SOLN
316.8000 mg | Freq: Once | INTRAVENOUS | Status: AC
  Administered 2024-01-09: 320 mg via INTRAVENOUS
  Filled 2024-01-09: qty 32

## 2024-01-09 MED ORDER — CETIRIZINE HCL 10 MG/ML IV SOLN
10.0000 mg | Freq: Once | INTRAVENOUS | Status: AC
  Administered 2024-01-09: 10 mg via INTRAVENOUS
  Filled 2024-01-09: qty 1

## 2024-01-09 MED ORDER — APREPITANT 130 MG/18ML IV EMUL
130.0000 mg | Freq: Once | INTRAVENOUS | Status: AC
  Administered 2024-01-09: 130 mg via INTRAVENOUS
  Filled 2024-01-09: qty 18

## 2024-01-09 MED ORDER — FAMOTIDINE IN NACL 20-0.9 MG/50ML-% IV SOLN
20.0000 mg | Freq: Once | INTRAVENOUS | Status: AC
  Administered 2024-01-09: 20 mg via INTRAVENOUS
  Filled 2024-01-09: qty 50

## 2024-01-09 MED ORDER — PALONOSETRON HCL INJECTION 0.25 MG/5ML
0.2500 mg | Freq: Once | INTRAVENOUS | Status: AC
  Administered 2024-01-09: 0.25 mg via INTRAVENOUS
  Filled 2024-01-09: qty 5

## 2024-01-09 MED ORDER — SODIUM CHLORIDE 0.9 % IV SOLN: INTRAVENOUS | Status: DC

## 2024-01-09 MED ORDER — DEXAMETHASONE SODIUM PHOSPHATE 10 MG/ML IJ SOLN
10.0000 mg | Freq: Once | INTRAMUSCULAR | Status: AC
  Administered 2024-01-09: 10 mg via INTRAVENOUS
  Filled 2024-01-09: qty 1

## 2024-01-09 NOTE — Patient Instructions (Signed)
 [facility] - A DEPT OF [place]  Discharge Instructions: Thank you for choosing [facility] to provide your oncology and hematology care.   If you have a lab appointment with the Cancer Center, please go directly to the Cancer Center and check in at the registration area.   Wear comfortable clothing and clothing appropriate for easy access to any Portacath or PICC line.   We strive to give you quality time with your provider. You may need to reschedule your appointment if you arrive late (15 or more minutes).  Arriving late affects you and other patients whose appointments are after yours.  Also, if you miss three or more appointments without notifying the office, you may be dismissed from the clinic at the provider's discretion.      For prescription refill requests, have your pharmacy contact our office and allow 72 hours for refills to be completed.    Today you received the following chemotherapy and/or immunotherapy agents: gemzar, carboplatin      To help prevent nausea and vomiting after your treatment, we encourage you to take your nausea medication as directed.  BELOW ARE SYMPTOMS THAT SHOULD BE REPORTED IMMEDIATELY: *FEVER GREATER THAN 100.4 F (38 C) OR HIGHER *CHILLS OR SWEATING *NAUSEA AND VOMITING THAT IS NOT CONTROLLED WITH YOUR NAUSEA MEDICATION *UNUSUAL SHORTNESS OF BREATH *UNUSUAL BRUISING OR BLEEDING *URINARY PROBLEMS (pain or burning when urinating, or frequent urination) *BOWEL PROBLEMS (unusual diarrhea, constipation, pain near the anus) TENDERNESS IN MOUTH AND THROAT WITH OR WITHOUT PRESENCE OF ULCERS (sore throat, sores in mouth, or a toothache) UNUSUAL RASH, SWELLING OR PAIN  UNUSUAL VAGINAL DISCHARGE OR ITCHING   Items with * indicate a potential emergency and should be followed up as soon as possible or go to the Emergency Department if any problems should occur.  Please show the CHEMOTHERAPY ALERT CARD or  IMMUNOTHERAPY ALERT CARD at check-in to the Emergency Department and triage nurse.  Should you have questions after your visit or need to cancel or reschedule your appointment, please contact [facility] - A DEPT OF [facility]  Dept: [phone]  and follow the prompts.  Office hours are 8:00 a.m. to 4:30 p.m. Monday - Friday. Please note that voicemails left after 4:00 p.m. may not be returned until the following business day.  We are closed weekends and major holidays. You have access to a nurse at all times for urgent questions. Please call the main number to the clinic Dept: [phone] and follow the prompts.   For any non-urgent questions, you may also contact your provider using MyChart. We now offer e-Visits for anyone 74 and older to request care online for non-urgent symptoms. For details visit [email or web address].   Also download the MyChart app! Go to the app store, search MyChart, open the app, select [place], and log in with your MyChart username and password.

## 2024-01-09 NOTE — Assessment & Plan Note (Addendum)
 The patient was diagnosed with fallopian tube cancer in 2018 status post surgery and completion of adjuvant chemotherapy in 2019 She developed recurrent disease in 2020 after presentation with high-grade small bowel obstruction treated with carboplatin , Doxil  and bevacizumab  followed by bevacizumab  maintenance She had gallbladder surgery in November 2023 and adjacent lymph node came back positive for recurrent high-grade serous carcinoma and she was treated with carboplatin , paclitaxel  and bevacizumab  She had progressed on niraparib  and bevacizumab , Neg genetics CT imaging from April show metastatic disease to the liver, regional lymph nodes and pulmonary nodules  Since cycle 1 of treatment, her course was complicated by severe pancytopenia requiring changes to the treatment plan The dose of treatment is modified and she is receiving treatment every other week CT imaging from July 2025 was compared with April 2025 which showed stable disease control Her tumor marker is also stable Overall, she tolerated treatment well except for intermittent severe pancytopenia Moving forward, her treatment cycle will be modified to a 5-week cycle with day 1 and day 22 instead of day 1 and day 8 of every cycle I plan to repeat imaging study again at the end of the year

## 2024-01-09 NOTE — Progress Notes (Signed)
 [facility] OFFICE PROGRESS NOTE  Patient Care Team: Loreli Kins, MD as PCP - General (Family Medicine) Lonn Hicks, MD as Consulting Physician (Hematology and Oncology)  Assessment & Plan Adenocarcinoma of right fallopian tube [facility]) The patient was diagnosed with fallopian tube cancer in 2018 status post surgery and completion of adjuvant chemotherapy in 2019 She developed recurrent disease in 2020 after presentation with high-grade small bowel obstruction treated with carboplatin , Doxil  and bevacizumab  followed by bevacizumab  maintenance She had gallbladder surgery in November 2023 and adjacent lymph node came back positive for recurrent high-grade serous carcinoma and she was treated with carboplatin , paclitaxel  and bevacizumab  She had progressed on niraparib  and bevacizumab , Neg genetics CT imaging from April show metastatic disease to the liver, regional lymph nodes and pulmonary nodules  Since cycle 1 of treatment, her course was complicated by severe pancytopenia requiring changes to the treatment plan The dose of treatment is modified and she is receiving treatment every other week CT imaging from July 2025 was compared with April 2025 which showed stable disease control Her tumor marker is also stable Overall, she tolerated treatment well except for intermittent severe pancytopenia Moving forward, her treatment cycle will be modified to a 5-week cycle with day 1 and day 22 instead of day 1 and day 8 of every cycle I plan to repeat imaging study again at the end of the year Pancytopenia, acquired [facility]) She has intermittent pancytopenia due to treatment She is not symptomatic We discussed risk and benefits of pursuing treatment and she is in agreement to proceed without delay Essential hypertension She has intermittent elevated blood pressure but she is not symptomatic She will continue treatment without delay We discussed importance of lifestyle changes and risk  factor modification for blood pressure control  No orders of the defined types were placed in this encounter.    Hicks Lonn, MD  INTERVAL HISTORY: she returns for treatment follow-up Complications related to previous cycle of chemotherapy included pancytopenia,, elevated BP, and elevated serum creatinine She denies nausea She is not symptomatic from elevated blood pressure  PHYSICAL EXAMINATION: ECOG PERFORMANCE STATUS: 0 - Asymptomatic  Lab Results  Component Value Date   CAN125 29.2 12/19/2023   CAN125 25.9 12/05/2023   CAN125 25.7 10/30/2023      Latest Ref Rng & Units 01/09/2024    9:00 AM 12/19/2023    8:10 AM 12/05/2023    7:31 AM  CBC  WBC 4.0 - 10.5 K/uL 3.7  2.5  3.4   Hemoglobin 12.0 - 15.0 g/dL 89.5  9.4  89.7   Hematocrit 36.0 - 46.0 % 31.5  28.7  30.8   Platelets 150 - 400 K/uL 222  96  194       Chemistry      Component Value Date/Time   NA 138 01/09/2024 0900   NA 139 04/14/2017 0742   K 3.9 01/09/2024 0900   K 4.0 04/14/2017 0742   CL 104 01/09/2024 0900   CO2 28 01/09/2024 0900   CO2 21 (L) 04/14/2017 0742   BUN 10 01/09/2024 0900   BUN 13.1 04/14/2017 0742   CREATININE 1.02 (H) 01/09/2024 0900   CREATININE 0.9 04/14/2017 0742      Component Value Date/Time   CALCIUM 8.9 01/09/2024 0900   CALCIUM 9.2 04/14/2017 0742   ALKPHOS 74 01/09/2024 0900   ALKPHOS 70 04/14/2017 0742   AST 19 01/09/2024 0900   AST 17 04/14/2017 0742   ALT 7 01/09/2024 0900  ALT 14 04/14/2017 0742   BILITOT 0.4 01/09/2024 0900   BILITOT 0.37 04/14/2017 0742       Vitals:   01/09/24 0937 01/09/24 0942  BP: (!) 177/80 (!) 169/82  Pulse: 86   Resp: 18   Temp: 97.7 F (36.5 C)   SpO2: 100%    Filed Weights   01/09/24 0937  Weight: 150 lb (68 kg)   Other relevant data reviewed during this visit included CBC and CMP

## 2024-01-09 NOTE — Assessment & Plan Note (Addendum)
 She has intermittent pancytopenia due to treatment She is not symptomatic We discussed risk and benefits of pursuing treatment and she is in agreement to proceed without delay

## 2024-01-09 NOTE — Assessment & Plan Note (Addendum)
 She has intermittent elevated blood pressure but she is not symptomatic She will continue treatment without delay We discussed importance of lifestyle changes and risk factor modification for blood pressure control

## 2024-01-11 LAB — CA 125: Cancer Antigen (CA) 125: 26 U/mL (ref 0.0–38.1)

## 2024-01-13 ENCOUNTER — Other Ambulatory Visit: Payer: Self-pay | Admitting: Hematology and Oncology

## 2024-01-21 ENCOUNTER — Ambulatory Visit: Admitting: Urology

## 2024-01-21 ENCOUNTER — Encounter: Payer: Self-pay | Admitting: Urology

## 2024-01-21 VITALS — BP 135/76 | HR 89 | Ht 65.0 in | Wt 147.0 lb

## 2024-01-21 DIAGNOSIS — N1330 Unspecified hydronephrosis: Secondary | ICD-10-CM

## 2024-01-21 DIAGNOSIS — N135 Crossing vessel and stricture of ureter without hydronephrosis: Secondary | ICD-10-CM

## 2024-01-21 DIAGNOSIS — N131 Hydronephrosis with ureteral stricture, not elsewhere classified: Secondary | ICD-10-CM | POA: Diagnosis not present

## 2024-01-21 LAB — URINALYSIS, ROUTINE W REFLEX MICROSCOPIC
Bilirubin, UA: NEGATIVE
Glucose, UA: NEGATIVE
Ketones, UA: NEGATIVE
Nitrite, UA: NEGATIVE
Protein,UA: NEGATIVE
Specific Gravity, UA: 1.01 (ref 1.005–1.030)
Urobilinogen, Ur: 1 mg/dL (ref 0.2–1.0)
pH, UA: 5.5 (ref 5.0–7.5)

## 2024-01-21 LAB — MICROSCOPIC EXAMINATION

## 2024-01-21 NOTE — Progress Notes (Signed)
 Assessment: 1. Hydronephrosis of right kidney   2. Ureteral stricture, right     Plan: Will review imaging studies when available. Return to office in 6 months  Chief Complaint:  Chief Complaint  Patient presents with   Hydronephrosis    History of Present Illness:  Holly [name] is a 68 y.o. female who is seen for further evaluation of right hydronephrosis. She underwent evaluation with CT imaging on 07/25/2023.  She was found to have right hydroureteronephrosis to the level of a 4 mm stone in the distal right ureter just below the pelvic inlet.  No other stones were seen. No prior history of kidney stones.  She has not had any flank pain.  No dysuria or gross hematuria. She had a UTI approximately 6 months ago. After review of the CT, the 4 mm calculus appeared to be present on prior CT studies dating back to May 2024 making a ureteral calculus less likely. She underwent cystoscopy, retrograde pyelogram, and right ureteroscopy for further evaluation of the right hydronephrosis on 08/19/23.  She was found to have narrowing of the distal right ureter with a segment <1 cm in length.  This was balloon dilated and a 7 French x 24 cm ureteral stent was placed. Her stent was removed on 09/12/2023.  At her visit in July 2025, she was doing well following her stent removal.  No flank pain.  No dysuria or gross hematuria. Cr 1.12 from 10/16/23. CT imaging from 11/17/2023 showed no hydronephrosis or evidence of obstruction. Creatinine from 01/09/2024 stable at 1.02.  She returns today for follow-up.  She is doing well.  No flank pain.  No lower urinary tract symptoms.  No dysuria or gross hematuria.  Portions of the above documentation were copied from a prior visit for review purposes only.  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   Ovarian cancer (HCC) dx'd 12/2016   recurrent dz 02/2019/2023   PONV (postoperative nausea and vomiting)     Past Surgical History:  Past Surgical History:  Procedure Laterality Date   ABDOMINAL HYSTERECTOMY  2018   CYSTOSCOPY/URETEROSCOPY/HOLMIUM LASER/STENT PLACEMENT Right 08/19/2023   Procedure: CYSTOSCOPY/URETEROSCOPY/DILATION OF URETERAL STRICTURE/STENT PLACEMENT;  Surgeon: Roseann Adine PARAS., MD;  Location: [facility];  Service: Urology;  Laterality: Right;   DEBULKING N/A 12/26/2016   Procedure: RADICAL TUMOR DEBULKING; OMENTECTOMY;  Surgeon: Eloy Herring, MD;  Location: [facility];  Service: Gynecology;  Laterality: N/A;   ganglion cyst removed     HIATAL HERNIA REPAIR     IR FLUORO GUIDE PORT INSERTION RIGHT  01/16/2017   IR IMAGING GUIDED PORT INSERTION  03/16/2019   IR REMOVAL TUN ACCESS W/ PORT W/O FL MOD SED  06/10/2017   IR US  GUIDE VASC ACCESS RIGHT  01/16/2017   LAPAROTOMY N/A 12/26/2016   Procedure: EXPLORATORY LAPAROTOMY;  Surgeon: Eloy Herring, MD;  Location: [facility];  Service: Gynecology;  Laterality: N/A;   left morton's neuroma surgery      Nissan Fundoplication      Allergies:  Allergies  Allergen Reactions   Dilaudid [Hydromorphone Hcl]  Nausea And Vomiting   Hydromorphone Other (See Comments)   Morphine  And Codeine  Nausea Only   Sudafed [Pseudoephedrine Hcl] Anxiety and Other (See Comments)    Nervous/jittery    Family History:  Family History  Problem Relation Age of Onset   Diabetes Mother    Diabetes Father    Hypertension Father    Stroke Father    COPD Father    Heart attack Sister 31   Dementia Maternal Aunt    Bone cancer Paternal Uncle        deceased at 69   Dementia Maternal Grandmother    Heart attack Paternal Grandmother    Heart attack Paternal Grandfather    Healthy  Son    Healthy Son     Social History:  Social History   Tobacco Use   Smoking status: Never    Passive exposure: Past   Smokeless tobacco: Never  Vaping Use   Vaping status: Never Used  Substance Use Topics   Alcohol use: No   Drug use: No    ROS: Constitutional:  Negative for fever, chills, weight loss CV: Negative for chest pain, previous MI, hypertension Respiratory:  Negative for shortness of breath, wheezing, sleep apnea, frequent cough GI:  Negative for nausea, vomiting, bloody stool, GERD  Physical exam: BP 135/76   Pulse 89   Ht 5' 5 (1.651 m)   Wt 147 lb (66.7 kg)   BMI 24.46 kg/m  GENERAL APPEARANCE:  Well appearing, well developed, well nourished, NAD HEENT:  Atraumatic, normocephalic, oropharynx clear NECK:  Supple without lymphadenopathy or thyromegaly ABDOMEN:  Soft, non-tender, no masses EXTREMITIES:  Moves all extremities well, without clubbing, cyanosis, or edema NEUROLOGIC:  Alert and oriented x 3, normal gait, CN II-XII grossly intact MENTAL STATUS:  appropriate BACK:  Non-tender to palpation, No CVAT SKIN:  Warm, dry, and intact  Results: U/A: 6-10 WBCs, 3-10 RBCs

## 2024-01-23 ENCOUNTER — Other Ambulatory Visit: Payer: Self-pay

## 2024-01-27 NOTE — Assessment & Plan Note (Signed)
 The patient was diagnosed with fallopian tube cancer in 2018 status post surgery and completion of adjuvant chemotherapy in 2019 She developed recurrent disease in 2020 after presentation with high-grade small bowel obstruction treated with carboplatin , Doxil  and bevacizumab  followed by bevacizumab  maintenance She had gallbladder surgery in November 2023 and adjacent lymph node came back positive for recurrent high-grade serous carcinoma and she was treated with carboplatin , paclitaxel  and bevacizumab  She had progressed on niraparib  and bevacizumab , Neg genetics CT imaging from April show metastatic disease to the liver, regional lymph nodes and pulmonary nodules  Since cycle 1 of treatment, her course was complicated by severe pancytopenia requiring changes to the treatment plan The dose of treatment is modified and she is receiving treatment every other week CT imaging from July 2025 was compared with April 2025 which showed stable disease control Her tumor marker is also stable Overall, she tolerated treatment well except for intermittent severe pancytopenia Moving forward, her treatment cycle will be modified to a 5-week cycle with day 1 and day 22 instead of day 1 and day 8 of every cycle I plan to repeat imaging study again at the end of the year

## 2024-01-28 ENCOUNTER — Other Ambulatory Visit: Payer: Self-pay | Admitting: Nurse Practitioner

## 2024-01-29 ENCOUNTER — Inpatient Hospital Stay

## 2024-01-29 ENCOUNTER — Encounter: Payer: Self-pay | Admitting: Hematology and Oncology

## 2024-01-29 ENCOUNTER — Inpatient Hospital Stay: Attending: Hematology and Oncology | Admitting: Hematology and Oncology

## 2024-01-29 VITALS — BP 161/79 | HR 77 | Temp 97.9°F | Resp 18 | Ht 65.0 in | Wt 148.0 lb

## 2024-01-29 DIAGNOSIS — C5701 Malignant neoplasm of right fallopian tube: Secondary | ICD-10-CM | POA: Insufficient documentation

## 2024-01-29 DIAGNOSIS — T451X5A Adverse effect of antineoplastic and immunosuppressive drugs, initial encounter: Secondary | ICD-10-CM | POA: Insufficient documentation

## 2024-01-29 DIAGNOSIS — C787 Secondary malignant neoplasm of liver and intrahepatic bile duct: Secondary | ICD-10-CM | POA: Diagnosis not present

## 2024-01-29 DIAGNOSIS — D61818 Other pancytopenia: Secondary | ICD-10-CM | POA: Diagnosis not present

## 2024-01-29 DIAGNOSIS — D701 Agranulocytosis secondary to cancer chemotherapy: Secondary | ICD-10-CM | POA: Insufficient documentation

## 2024-01-29 DIAGNOSIS — Z7189 Other specified counseling: Secondary | ICD-10-CM

## 2024-01-29 DIAGNOSIS — I10 Essential (primary) hypertension: Secondary | ICD-10-CM | POA: Diagnosis not present

## 2024-01-29 DIAGNOSIS — Z5111 Encounter for antineoplastic chemotherapy: Secondary | ICD-10-CM | POA: Diagnosis present

## 2024-01-29 LAB — CMP (CANCER CENTER ONLY)
ALT: 8 U/L (ref 0–44)
AST: 21 U/L (ref 15–41)
Albumin: 3.7 g/dL (ref 3.5–5.0)
Alkaline Phosphatase: 70 U/L (ref 38–126)
Anion gap: 6 (ref 5–15)
BUN: 13 mg/dL (ref 8–23)
CO2: 27 mmol/L (ref 22–32)
Calcium: 9.2 mg/dL (ref 8.9–10.3)
Chloride: 105 mmol/L (ref 98–111)
Creatinine: 1.1 mg/dL — ABNORMAL HIGH (ref 0.44–1.00)
GFR, Estimated: 54 mL/min — ABNORMAL LOW (ref >60)
Glucose, Bld: 121 mg/dL — ABNORMAL HIGH (ref 70–99)
Potassium: 3.9 mmol/L (ref 3.5–5.1)
Sodium: 138 mmol/L (ref 135–145)
Total Bilirubin: 0.4 mg/dL (ref 0.0–1.2)
Total Protein: 6.4 g/dL — ABNORMAL LOW (ref 6.5–8.1)

## 2024-01-29 LAB — CBC WITH DIFFERENTIAL (CANCER CENTER ONLY)
Abs Immature Granulocytes: 0.01 K/uL (ref 0.00–0.07)
Basophils Absolute: 0 K/uL (ref 0.0–0.1)
Basophils Relative: 1 %
Eosinophils Absolute: 0.1 K/uL (ref 0.0–0.5)
Eosinophils Relative: 4 %
HCT: 29.7 % — ABNORMAL LOW (ref 36.0–46.0)
Hemoglobin: 9.8 g/dL — ABNORMAL LOW (ref 12.0–15.0)
Immature Granulocytes: 0 %
Lymphocytes Relative: 28 %
Lymphs Abs: 0.7 K/uL (ref 0.7–4.0)
MCH: 31.1 pg (ref 26.0–34.0)
MCHC: 33 g/dL (ref 30.0–36.0)
MCV: 94.3 fL (ref 80.0–100.0)
Monocytes Absolute: 0.6 K/uL (ref 0.1–1.0)
Monocytes Relative: 24 %
Neutro Abs: 1.1 K/uL — ABNORMAL LOW (ref 1.7–7.7)
Neutrophils Relative %: 43 %
Platelet Count: 201 K/uL (ref 150–400)
RBC: 3.15 MIL/uL — ABNORMAL LOW (ref 3.87–5.11)
RDW: 17.2 % — ABNORMAL HIGH (ref 11.5–15.5)
WBC Count: 2.5 K/uL — ABNORMAL LOW (ref 4.0–10.5)
nRBC: 0 % (ref 0.0–0.2)

## 2024-01-29 MED ORDER — PROCHLORPERAZINE MALEATE 10 MG PO TABS
10.0000 mg | ORAL_TABLET | Freq: Once | ORAL | Status: AC
  Administered 2024-01-29: 10 mg via ORAL
  Filled 2024-01-29: qty 1

## 2024-01-29 MED ORDER — SODIUM CHLORIDE 0.9 % IV SOLN: INTRAVENOUS | Status: DC

## 2024-01-29 MED ORDER — SODIUM CHLORIDE 0.9 % IV SOLN
800.0000 mg/m2 | Freq: Once | INTRAVENOUS | Status: AC
  Administered 2024-01-29: 1406 mg via INTRAVENOUS
  Filled 2024-01-29: qty 36.98

## 2024-01-29 NOTE — Assessment & Plan Note (Addendum)
 She has intermittent elevated blood pressure but she is not symptomatic She will continue treatment without delay We discussed importance of lifestyle changes and risk factor modification for blood pressure control

## 2024-01-29 NOTE — Patient Instructions (Signed)
 [facility] - A DEPT OF [facility]  Discharge Instructions: Thank you for choosing [facility] to provide your oncology and hematology care.   If you have a lab appointment with the Cancer Center, please go directly to the Cancer Center and check in at the registration area.   Wear comfortable clothing and clothing appropriate for easy access to any Portacath or PICC line.   We strive to give you quality time with your provider. You may need to reschedule your appointment if you arrive late (15 or more minutes).  Arriving late affects you and other patients whose appointments are after yours.  Also, if you miss three or more appointments without notifying the office, you may be dismissed from the clinic at the provider's discretion.      For prescription refill requests, have your pharmacy contact our office and allow 72 hours for refills to be completed.    Today you received the following chemotherapy and/or immunotherapy agents: Gemcitabine (Gemzar)      To help prevent nausea and vomiting after your treatment, we encourage you to take your nausea medication as directed.  BELOW ARE SYMPTOMS THAT SHOULD BE REPORTED IMMEDIATELY: *FEVER GREATER THAN 100.4 F (38 C) OR HIGHER *CHILLS OR SWEATING *NAUSEA AND VOMITING THAT IS NOT CONTROLLED WITH YOUR NAUSEA MEDICATION *UNUSUAL SHORTNESS OF BREATH *UNUSUAL BRUISING OR BLEEDING *URINARY PROBLEMS (pain or burning when urinating, or frequent urination) *BOWEL PROBLEMS (unusual diarrhea, constipation, pain near the anus) TENDERNESS IN MOUTH AND THROAT WITH OR WITHOUT PRESENCE OF ULCERS (sore throat, sores in mouth, or a toothache) UNUSUAL RASH, SWELLING OR PAIN  UNUSUAL VAGINAL DISCHARGE OR ITCHING   Items with * indicate a potential emergency and should be followed up as soon as possible or go to the Emergency Department if any problems should occur.  Please show the CHEMOTHERAPY ALERT CARD or  IMMUNOTHERAPY ALERT CARD at check-in to the Emergency Department and triage nurse.  Should you have questions after your visit or need to cancel or reschedule your appointment, please contact [facility] - A DEPT OF [facility]  Dept: [phone]  and follow the prompts.  Office hours are 8:00 a.m. to 4:30 p.m. Monday - Friday. Please note that voicemails left after 4:00 p.m. may not be returned until the following business day.  We are closed weekends and major holidays. You have access to a nurse at all times for urgent questions. Please call the main number to the clinic Dept: [phone] and follow the prompts.   For any non-urgent questions, you may also contact your provider using MyChart. We now offer e-Visits for anyone 93 and older to request care online for non-urgent symptoms. For details visit [email or web address].   Also download the MyChart app! Go to the app store, search "MyChart", open the app, select [place], and log in with your MyChart username and password.

## 2024-01-29 NOTE — Assessment & Plan Note (Addendum)
 She has intermittent pancytopenia due to treatment She is not symptomatic We discussed risk and benefits of pursuing treatment and she is in agreement to proceed without delay

## 2024-01-29 NOTE — Progress Notes (Signed)
 [facility] OFFICE PROGRESS NOTE  Patient Care Team: Loreli Kins, MD as PCP - General (Family Medicine) Lonn Hicks, MD as Consulting Physician (Hematology and Oncology)  Assessment & Plan Adenocarcinoma of right fallopian tube [facility]) The patient was diagnosed with fallopian tube cancer in 2018 status post surgery and completion of adjuvant chemotherapy in 2019 She developed recurrent disease in 2020 after presentation with high-grade small bowel obstruction treated with carboplatin , Doxil  and bevacizumab  followed by bevacizumab  maintenance She had gallbladder surgery in November 2023 and adjacent lymph node came back positive for recurrent high-grade serous carcinoma and she was treated with carboplatin , paclitaxel  and bevacizumab  She had progressed on niraparib  and bevacizumab , Neg genetics CT imaging from April show metastatic disease to the liver, regional lymph nodes and pulmonary nodules  Since cycle 1 of treatment, her course was complicated by severe pancytopenia requiring changes to the treatment plan The dose of treatment is modified and she is receiving treatment every other week CT imaging from July 2025 was compared with April 2025 which showed stable disease control Her tumor marker is also stable Overall, she tolerated treatment well except for intermittent severe pancytopenia Moving forward, her treatment cycle will be modified to a 5-week cycle with day 1 and day 22 instead of day 1 and day 8 of every cycle Due to moderate to severe leukopenia, she will get 2 weeks of before she returns for her next cycle of treatment I plan to repeat imaging study again at the end of the year Pancytopenia, acquired [facility]) She has intermittent pancytopenia due to treatment She is not symptomatic We discussed risk and benefits of pursuing treatment and she is in agreement to proceed without delay Essential hypertension She has intermittent elevated blood pressure but she is  not symptomatic She will continue treatment without delay We discussed importance of lifestyle changes and risk factor modification for blood pressure control  No orders of the defined types were placed in this encounter.    Hicks Lonn, MD  INTERVAL HISTORY: she returns for treatment follow-up Complications related to previous cycle of chemotherapy included pancytopenia, and elevated BP She is not symptomatic from elevated blood pressure She denies recent infection  PHYSICAL EXAMINATION: ECOG PERFORMANCE STATUS: 1 - Symptomatic but completely ambulatory  Lab Results  Component Value Date   CAN125 26.0 01/09/2024   CAN125 29.2 12/19/2023   CAN125 25.9 12/05/2023      Latest Ref Rng & Units 01/29/2024    8:43 AM 01/09/2024    9:00 AM 12/19/2023    8:10 AM  CBC  WBC 4.0 - 10.5 K/uL 2.5  3.7  2.5   Hemoglobin 12.0 - 15.0 g/dL 9.8  89.5  9.4   Hematocrit 36.0 - 46.0 % 29.7  31.5  28.7   Platelets 150 - 400 K/uL 201  222  96       Chemistry      Component Value Date/Time   NA 138 01/09/2024 0900   NA 139 04/14/2017 0742   K 3.9 01/09/2024 0900   K 4.0 04/14/2017 0742   CL 104 01/09/2024 0900   CO2 28 01/09/2024 0900   CO2 21 (L) 04/14/2017 0742   BUN 10 01/09/2024 0900   BUN 13.1 04/14/2017 0742   CREATININE 1.02 (H) 01/09/2024 0900   CREATININE 0.9 04/14/2017 0742      Component Value Date/Time   CALCIUM 8.9 01/09/2024 0900   CALCIUM 9.2 04/14/2017 0742   ALKPHOS 74 01/09/2024 0900   ALKPHOS 70  04/14/2017 0742   AST 19 01/09/2024 0900   AST 17 04/14/2017 0742   ALT 7 01/09/2024 0900   ALT 14 04/14/2017 0742   BILITOT 0.4 01/09/2024 0900   BILITOT 0.37 04/14/2017 0742       There were no vitals filed for this visit. There were no vitals filed for this visit. Other relevant data reviewed during this visit included CBC and CMP

## 2024-01-30 ENCOUNTER — Other Ambulatory Visit: Payer: Self-pay | Admitting: Hematology and Oncology

## 2024-01-30 LAB — CA 125: Cancer Antigen (CA) 125: 26.6 U/mL (ref 0.0–38.1)

## 2024-02-16 ENCOUNTER — Encounter: Payer: Self-pay | Admitting: Hematology and Oncology

## 2024-02-16 ENCOUNTER — Inpatient Hospital Stay

## 2024-02-16 ENCOUNTER — Inpatient Hospital Stay: Admitting: Hematology and Oncology

## 2024-02-16 VITALS — BP 169/70 | HR 87 | Temp 97.6°F | Resp 17 | Ht 65.0 in | Wt 150.5 lb

## 2024-02-16 DIAGNOSIS — Z5111 Encounter for antineoplastic chemotherapy: Secondary | ICD-10-CM | POA: Diagnosis not present

## 2024-02-16 DIAGNOSIS — D61818 Other pancytopenia: Secondary | ICD-10-CM | POA: Diagnosis not present

## 2024-02-16 DIAGNOSIS — N183 Chronic kidney disease, stage 3 unspecified: Secondary | ICD-10-CM | POA: Diagnosis not present

## 2024-02-16 DIAGNOSIS — C5701 Malignant neoplasm of right fallopian tube: Secondary | ICD-10-CM | POA: Diagnosis not present

## 2024-02-16 DIAGNOSIS — Z7189 Other specified counseling: Secondary | ICD-10-CM

## 2024-02-16 LAB — CMP (CANCER CENTER ONLY)
ALT: 8 U/L (ref 0–44)
AST: 20 U/L (ref 15–41)
Albumin: 3.7 g/dL (ref 3.5–5.0)
Alkaline Phosphatase: 65 U/L (ref 38–126)
Anion gap: 6 (ref 5–15)
BUN: 14 mg/dL (ref 8–23)
CO2: 26 mmol/L (ref 22–32)
Calcium: 8.9 mg/dL (ref 8.9–10.3)
Chloride: 106 mmol/L (ref 98–111)
Creatinine: 1.23 mg/dL — ABNORMAL HIGH (ref 0.44–1.00)
GFR, Estimated: 48 mL/min — ABNORMAL LOW (ref >60)
Glucose, Bld: 131 mg/dL — ABNORMAL HIGH (ref 70–99)
Potassium: 4.1 mmol/L (ref 3.5–5.1)
Sodium: 138 mmol/L (ref 135–145)
Total Bilirubin: 0.5 mg/dL (ref 0.0–1.2)
Total Protein: 6.1 g/dL — ABNORMAL LOW (ref 6.5–8.1)

## 2024-02-16 LAB — CBC WITH DIFFERENTIAL (CANCER CENTER ONLY)
Abs Immature Granulocytes: 0.02 K/uL (ref 0.00–0.07)
Basophils Absolute: 0 K/uL (ref 0.0–0.1)
Basophils Relative: 1 %
Eosinophils Absolute: 0.2 K/uL (ref 0.0–0.5)
Eosinophils Relative: 5 %
HCT: 31.3 % — ABNORMAL LOW (ref 36.0–46.0)
Hemoglobin: 10.3 g/dL — ABNORMAL LOW (ref 12.0–15.0)
Immature Granulocytes: 1 %
Lymphocytes Relative: 24 %
Lymphs Abs: 0.8 K/uL (ref 0.7–4.0)
MCH: 31.6 pg (ref 26.0–34.0)
MCHC: 32.9 g/dL (ref 30.0–36.0)
MCV: 96 fL (ref 80.0–100.0)
Monocytes Absolute: 0.6 K/uL (ref 0.1–1.0)
Monocytes Relative: 17 %
Neutro Abs: 1.9 K/uL (ref 1.7–7.7)
Neutrophils Relative %: 52 %
Platelet Count: 227 K/uL (ref 150–400)
RBC: 3.26 MIL/uL — ABNORMAL LOW (ref 3.87–5.11)
RDW: 18.4 % — ABNORMAL HIGH (ref 11.5–15.5)
WBC Count: 3.5 K/uL — ABNORMAL LOW (ref 4.0–10.5)
nRBC: 0 % (ref 0.0–0.2)

## 2024-02-16 MED ORDER — SODIUM CHLORIDE 0.9 % IV SOLN
280.0000 mg | Freq: Once | INTRAVENOUS | Status: AC
  Administered 2024-02-16: 280 mg via INTRAVENOUS
  Filled 2024-02-16: qty 28

## 2024-02-16 MED ORDER — DEXAMETHASONE SOD PHOSPHATE PF 10 MG/ML IJ SOLN
10.0000 mg | Freq: Once | INTRAMUSCULAR | Status: AC
  Administered 2024-02-16: 10 mg via INTRAVENOUS

## 2024-02-16 MED ORDER — APREPITANT 130 MG/18ML IV EMUL
130.0000 mg | Freq: Once | INTRAVENOUS | Status: AC
  Administered 2024-02-16: 130 mg via INTRAVENOUS
  Filled 2024-02-16: qty 18

## 2024-02-16 MED ORDER — FAMOTIDINE IN NACL 20-0.9 MG/50ML-% IV SOLN
20.0000 mg | Freq: Once | INTRAVENOUS | Status: AC
  Administered 2024-02-16: 20 mg via INTRAVENOUS
  Filled 2024-02-16: qty 50

## 2024-02-16 MED ORDER — SODIUM CHLORIDE 0.9 % IV SOLN: INTRAVENOUS | Status: DC

## 2024-02-16 MED ORDER — SODIUM CHLORIDE 0.9 % IV SOLN
800.0000 mg/m2 | Freq: Once | INTRAVENOUS | Status: AC
  Administered 2024-02-16: 1406 mg via INTRAVENOUS
  Filled 2024-02-16: qty 36.98

## 2024-02-16 MED ORDER — PALONOSETRON HCL INJECTION 0.25 MG/5ML
0.2500 mg | Freq: Once | INTRAVENOUS | Status: AC
  Administered 2024-02-16: 0.25 mg via INTRAVENOUS
  Filled 2024-02-16: qty 5

## 2024-02-16 MED ORDER — CETIRIZINE HCL 10 MG/ML IV SOLN
10.0000 mg | Freq: Once | INTRAVENOUS | Status: AC
  Administered 2024-02-16: 10 mg via INTRAVENOUS
  Filled 2024-02-16: qty 1

## 2024-02-16 NOTE — Patient Instructions (Signed)
 [facility] - A DEPT OF [place]  Discharge Instructions: Thank you for choosing [facility] to provide your oncology and hematology care.   If you have a lab appointment with the Cancer Center, please go directly to the Cancer Center and check in at the registration area.   Wear comfortable clothing and clothing appropriate for easy access to any Portacath or PICC line.   We strive to give you quality time with your provider. You may need to reschedule your appointment if you arrive late (15 or more minutes).  Arriving late affects you and other patients whose appointments are after yours.  Also, if you miss three or more appointments without notifying the office, you may be dismissed from the clinic at the provider's discretion.      For prescription refill requests, have your pharmacy contact our office and allow 72 hours for refills to be completed.    Today you received the following chemotherapy and/or immunotherapy agents: gemzar, carboplatin      To help prevent nausea and vomiting after your treatment, we encourage you to take your nausea medication as directed.  BELOW ARE SYMPTOMS THAT SHOULD BE REPORTED IMMEDIATELY: *FEVER GREATER THAN 100.4 F (38 C) OR HIGHER *CHILLS OR SWEATING *NAUSEA AND VOMITING THAT IS NOT CONTROLLED WITH YOUR NAUSEA MEDICATION *UNUSUAL SHORTNESS OF BREATH *UNUSUAL BRUISING OR BLEEDING *URINARY PROBLEMS (pain or burning when urinating, or frequent urination) *BOWEL PROBLEMS (unusual diarrhea, constipation, pain near the anus) TENDERNESS IN MOUTH AND THROAT WITH OR WITHOUT PRESENCE OF ULCERS (sore throat, sores in mouth, or a toothache) UNUSUAL RASH, SWELLING OR PAIN  UNUSUAL VAGINAL DISCHARGE OR ITCHING   Items with * indicate a potential emergency and should be followed up as soon as possible or go to the Emergency Department if any problems should occur.  Please show the CHEMOTHERAPY ALERT CARD or  IMMUNOTHERAPY ALERT CARD at check-in to the Emergency Department and triage nurse.  Should you have questions after your visit or need to cancel or reschedule your appointment, please contact [facility] - A DEPT OF [facility]  Dept: [phone]  and follow the prompts.  Office hours are 8:00 a.m. to 4:30 p.m. Monday - Friday. Please note that voicemails left after 4:00 p.m. may not be returned until the following business day.  We are closed weekends and major holidays. You have access to a nurse at all times for urgent questions. Please call the main number to the clinic Dept: [phone] and follow the prompts.   For any non-urgent questions, you may also contact your provider using MyChart. We now offer e-Visits for anyone 74 and older to request care online for non-urgent symptoms. For details visit [email or web address].   Also download the MyChart app! Go to the app store, search MyChart, open the app, select [place], and log in with your MyChart username and password.

## 2024-02-16 NOTE — Assessment & Plan Note (Addendum)
 We discussed importance of risk factor modification and increase oral fluid intake

## 2024-02-16 NOTE — Assessment & Plan Note (Addendum)
 She has intermittent pancytopenia due to treatment She is not symptomatic We discussed risk and benefits of pursuing treatment and she is in agreement to proceed without delay

## 2024-02-16 NOTE — Progress Notes (Signed)
 [facility] OFFICE PROGRESS NOTE  Patient Care Team: Loreli Kins, MD as PCP - General (Family Medicine) Lonn Hicks, MD as Consulting Physician (Hematology and Oncology)  Assessment & Plan Adenocarcinoma of right fallopian tube [facility]) The patient was diagnosed with fallopian tube cancer in 2018 status post surgery and completion of adjuvant chemotherapy in 2019 She developed recurrent disease in 2020 after presentation with high-grade small bowel obstruction treated with carboplatin , Doxil  and bevacizumab  followed by bevacizumab  maintenance She had gallbladder surgery in November 2023 and adjacent lymph node came back positive for recurrent high-grade serous carcinoma and she was treated with carboplatin , paclitaxel  and bevacizumab  She had progressed on niraparib  and bevacizumab , Neg genetics CT imaging from April show metastatic disease to the liver, regional lymph nodes and pulmonary nodules  She is receiving palliative chemotherapy with combination of carboplatin  and gemcitabine  now Since cycle 1 of treatment, her course was complicated by severe pancytopenia requiring changes to the treatment plan The dose of treatment is modified and she is receiving treatment every other week CT imaging from July 2025 was compared with April 2025 which showed stable disease control Her tumor marker is also stable Overall, she tolerated treatment well except for intermittent severe pancytopenia Moving forward, her treatment cycle will be modified to a 5-week cycle with day 1 and day 22 instead of day 1 and day 8 of every cycle Repeat CBC today show persistent pancytopenia but she is not symptomatic We will proceed with treatment as scheduled I plan to repeat imaging study again at the end of the year Pancytopenia, acquired [facility]) She has intermittent pancytopenia due to treatment She is not symptomatic We discussed risk and benefits of pursuing treatment and she is in agreement to  proceed without delay Stage 3 chronic kidney disease, unspecified whether stage 3a or 3b CKD (HCC) We discussed importance of risk factor modification and increase oral fluid intake  No orders of the defined types were placed in this encounter.    Hicks Lonn, MD  INTERVAL HISTORY: she returns for treatment follow-up Complications related to previous cycle of chemotherapy included pancytopenia, and elevated serum creatinine  PHYSICAL EXAMINATION: ECOG PERFORMANCE STATUS: 1 - Symptomatic but completely ambulatory  Lab Results  Component Value Date   CAN125 26.6 01/29/2024   CAN125 26.0 01/09/2024   CAN125 29.2 12/19/2023      Latest Ref Rng & Units 02/16/2024    7:49 AM 01/29/2024    8:43 AM 01/09/2024    9:00 AM  CBC  WBC 4.0 - 10.5 K/uL 3.5  2.5  3.7   Hemoglobin 12.0 - 15.0 g/dL 89.6  9.8  89.5   Hematocrit 36.0 - 46.0 % 31.3  29.7  31.5   Platelets 150 - 400 K/uL 227  201  222       Chemistry      Component Value Date/Time   NA 138 02/16/2024 0749   NA 139 04/14/2017 0742   K 4.1 02/16/2024 0749   K 4.0 04/14/2017 0742   CL 106 02/16/2024 0749   CO2 26 02/16/2024 0749   CO2 21 (L) 04/14/2017 0742   BUN 14 02/16/2024 0749   BUN 13.1 04/14/2017 0742   CREATININE 1.23 (H) 02/16/2024 0749   CREATININE 0.9 04/14/2017 0742      Component Value Date/Time   CALCIUM 8.9 02/16/2024 0749   CALCIUM 9.2 04/14/2017 0742   ALKPHOS 65 02/16/2024 0749   ALKPHOS 70 04/14/2017 0742   AST 20 02/16/2024 0749  AST 17 04/14/2017 0742   ALT 8 02/16/2024 0749   ALT 14 04/14/2017 0742   BILITOT 0.5 02/16/2024 0749   BILITOT 0.37 04/14/2017 0742       There were no vitals filed for this visit. There were no vitals filed for this visit. Other relevant data reviewed during this visit included CBC and CMP

## 2024-02-16 NOTE — Assessment & Plan Note (Addendum)
 The patient was diagnosed with fallopian tube cancer in 2018 status post surgery and completion of adjuvant chemotherapy in 2019 She developed recurrent disease in 2020 after presentation with high-grade small bowel obstruction treated with carboplatin , Doxil  and bevacizumab  followed by bevacizumab  maintenance She had gallbladder surgery in November 2023 and adjacent lymph node came back positive for recurrent high-grade serous carcinoma and she was treated with carboplatin , paclitaxel  and bevacizumab  She had progressed on niraparib  and bevacizumab , Neg genetics CT imaging from April show metastatic disease to the liver, regional lymph nodes and pulmonary nodules  She is receiving palliative chemotherapy with combination of carboplatin  and gemcitabine  now Since cycle 1 of treatment, her course was complicated by severe pancytopenia requiring changes to the treatment plan The dose of treatment is modified and she is receiving treatment every other week CT imaging from July 2025 was compared with April 2025 which showed stable disease control Her tumor marker is also stable Overall, she tolerated treatment well except for intermittent severe pancytopenia Moving forward, her treatment cycle will be modified to a 5-week cycle with day 1 and day 22 instead of day 1 and day 8 of every cycle Repeat CBC today show persistent pancytopenia but she is not symptomatic We will proceed with treatment as scheduled I plan to repeat imaging study again at the end of the year

## 2024-02-17 LAB — CA 125: Cancer Antigen (CA) 125: 24 U/mL (ref 0.0–38.1)

## 2024-02-19 ENCOUNTER — Ambulatory Visit: Admitting: Hematology and Oncology

## 2024-02-19 ENCOUNTER — Ambulatory Visit

## 2024-02-19 ENCOUNTER — Other Ambulatory Visit

## 2024-02-20 ENCOUNTER — Other Ambulatory Visit: Payer: Self-pay

## 2024-02-20 ENCOUNTER — Telehealth: Payer: Self-pay

## 2024-02-20 ENCOUNTER — Encounter: Payer: Self-pay | Admitting: Hematology and Oncology

## 2024-02-20 MED ORDER — NYSTATIN 100000 UNIT/ML MT SUSP: 5.0000 mL | Freq: Four times a day (QID) | OROMUCOSAL | 0 refills | Status: AC

## 2024-02-20 NOTE — Telephone Encounter (Signed)
 Called and given below message. Sent Rx to pharmacy as she asked.

## 2024-02-20 NOTE — Telephone Encounter (Signed)
-----   Message from Almarie Bedford sent at 02/20/2024  9:12 AM EDT ----- I saw her mychart msg Does not look like thrush but could be early Can you call in nystatin  swish and spit 4 x daily for 1 week

## 2024-03-12 ENCOUNTER — Other Ambulatory Visit: Payer: Self-pay | Admitting: Hematology and Oncology

## 2024-03-12 ENCOUNTER — Encounter: Payer: Self-pay | Admitting: Hematology and Oncology

## 2024-03-12 ENCOUNTER — Inpatient Hospital Stay

## 2024-03-12 ENCOUNTER — Inpatient Hospital Stay: Attending: Hematology and Oncology

## 2024-03-12 ENCOUNTER — Inpatient Hospital Stay (HOSPITAL_BASED_OUTPATIENT_CLINIC_OR_DEPARTMENT_OTHER): Admitting: Hematology and Oncology

## 2024-03-12 VITALS — BP 175/89 | HR 72 | Temp 97.8°F | Resp 17 | Wt 147.0 lb

## 2024-03-12 DIAGNOSIS — C5701 Malignant neoplasm of right fallopian tube: Secondary | ICD-10-CM

## 2024-03-12 DIAGNOSIS — D61818 Other pancytopenia: Secondary | ICD-10-CM | POA: Diagnosis not present

## 2024-03-12 DIAGNOSIS — Z5111 Encounter for antineoplastic chemotherapy: Secondary | ICD-10-CM | POA: Insufficient documentation

## 2024-03-12 DIAGNOSIS — N183 Chronic kidney disease, stage 3 unspecified: Secondary | ICD-10-CM | POA: Diagnosis not present

## 2024-03-12 DIAGNOSIS — C787 Secondary malignant neoplasm of liver and intrahepatic bile duct: Secondary | ICD-10-CM | POA: Diagnosis not present

## 2024-03-12 DIAGNOSIS — Z7189 Other specified counseling: Secondary | ICD-10-CM

## 2024-03-12 LAB — CBC WITH DIFFERENTIAL (CANCER CENTER ONLY)
Abs Immature Granulocytes: 0.02 K/uL (ref 0.00–0.07)
Basophils Absolute: 0.1 K/uL (ref 0.0–0.1)
Basophils Relative: 2 %
Eosinophils Absolute: 0.1 K/uL (ref 0.0–0.5)
Eosinophils Relative: 3 %
HCT: 32.4 % — ABNORMAL LOW (ref 36.0–46.0)
Hemoglobin: 10.8 g/dL — ABNORMAL LOW (ref 12.0–15.0)
Immature Granulocytes: 1 %
Lymphocytes Relative: 28 %
Lymphs Abs: 0.9 K/uL (ref 0.7–4.0)
MCH: 31.4 pg (ref 26.0–34.0)
MCHC: 33.3 g/dL (ref 30.0–36.0)
MCV: 94.2 fL (ref 80.0–100.0)
Monocytes Absolute: 0.6 K/uL (ref 0.1–1.0)
Monocytes Relative: 18 %
Neutro Abs: 1.6 K/uL — ABNORMAL LOW (ref 1.7–7.7)
Neutrophils Relative %: 48 %
Platelet Count: 180 K/uL (ref 150–400)
RBC: 3.44 MIL/uL — ABNORMAL LOW (ref 3.87–5.11)
RDW: 17.2 % — ABNORMAL HIGH (ref 11.5–15.5)
WBC Count: 3.4 K/uL — ABNORMAL LOW (ref 4.0–10.5)
nRBC: 0 % (ref 0.0–0.2)

## 2024-03-12 LAB — CMP (CANCER CENTER ONLY)
ALT: 9 U/L (ref 0–44)
AST: 28 U/L (ref 15–41)
Albumin: 4 g/dL (ref 3.5–5.0)
Alkaline Phosphatase: 79 U/L (ref 38–126)
Anion gap: 10 (ref 5–15)
BUN: 13 mg/dL (ref 8–23)
CO2: 25 mmol/L (ref 22–32)
Calcium: 9.3 mg/dL (ref 8.9–10.3)
Chloride: 103 mmol/L (ref 98–111)
Creatinine: 1.18 mg/dL — ABNORMAL HIGH (ref 0.44–1.00)
GFR, Estimated: 50 mL/min — ABNORMAL LOW (ref >60)
Glucose, Bld: 118 mg/dL — ABNORMAL HIGH (ref 70–99)
Potassium: 4.2 mmol/L (ref 3.5–5.1)
Sodium: 138 mmol/L (ref 135–145)
Total Bilirubin: 0.4 mg/dL (ref 0.0–1.2)
Total Protein: 6.6 g/dL (ref 6.5–8.1)

## 2024-03-12 MED ORDER — SODIUM CHLORIDE 0.9 % IV SOLN
800.0000 mg/m2 | Freq: Once | INTRAVENOUS | Status: AC
  Administered 2024-03-12: 1406 mg via INTRAVENOUS
  Filled 2024-03-12: qty 36.98

## 2024-03-12 MED ORDER — SODIUM CHLORIDE 0.9 % IV SOLN: INTRAVENOUS | Status: DC

## 2024-03-12 MED ORDER — PROCHLORPERAZINE MALEATE 10 MG PO TABS
10.0000 mg | ORAL_TABLET | Freq: Once | ORAL | Status: AC
  Administered 2024-03-12: 10 mg via ORAL
  Filled 2024-03-12: qty 1

## 2024-03-12 NOTE — Assessment & Plan Note (Addendum)
 The patient was diagnosed with fallopian tube cancer in 2018 status post surgery and completion of adjuvant chemotherapy in 2019 She developed recurrent disease in 2020 after presentation with high-grade small bowel obstruction treated with carboplatin , Doxil  and bevacizumab  followed by bevacizumab  maintenance She had gallbladder surgery in November 2023 and adjacent lymph node came back positive for recurrent high-grade serous carcinoma and she was treated with carboplatin , paclitaxel  and bevacizumab  She had progressed on niraparib  and bevacizumab , Neg genetics CT imaging from April show metastatic disease to the liver, regional lymph nodes and pulmonary nodules  She is receiving palliative chemotherapy with combination of carboplatin  and gemcitabine  now Since cycle 1 of treatment, her course was complicated by severe pancytopenia requiring changes to the treatment plan The dose of treatment is modified and she is receiving treatment every other week CT imaging from July 2025 was compared with April 2025 which showed stable disease control Her tumor marker is also stable Overall, she tolerated treatment well except for intermittent severe pancytopenia Moving forward, her treatment cycle will be modified to a 5-week cycle with day 1 and day 22 instead of day 1 and day 8 of every cycle Repeat CBC today show persistent pancytopenia but she is not symptomatic We will proceed with treatment as scheduled I plan to repeat imaging study again at the end of the year

## 2024-03-12 NOTE — Assessment & Plan Note (Addendum)
 She has intermittent pancytopenia due to treatment She is not symptomatic We discussed risk and benefits of pursuing treatment and she is in agreement to proceed without delay

## 2024-03-12 NOTE — Assessment & Plan Note (Addendum)
 We discussed importance of risk factor modification and increase oral fluid intake

## 2024-03-12 NOTE — Patient Instructions (Signed)
 [facility] - A DEPT OF [facility]  Discharge Instructions: Thank you for choosing [facility] to provide your oncology and hematology care.   If you have a lab appointment with the Cancer Center, please go directly to the Cancer Center and check in at the registration area.   Wear comfortable clothing and clothing appropriate for easy access to any Portacath or PICC line.   We strive to give you quality time with your provider. You may need to reschedule your appointment if you arrive late (15 or more minutes).  Arriving late affects you and other patients whose appointments are after yours.  Also, if you miss three or more appointments without notifying the office, you may be dismissed from the clinic at the provider's discretion.      For prescription refill requests, have your pharmacy contact our office and allow 72 hours for refills to be completed.    Today you received the following chemotherapy and/or immunotherapy agents: Gemcitabine (Gemzar)      To help prevent nausea and vomiting after your treatment, we encourage you to take your nausea medication as directed.  BELOW ARE SYMPTOMS THAT SHOULD BE REPORTED IMMEDIATELY: *FEVER GREATER THAN 100.4 F (38 C) OR HIGHER *CHILLS OR SWEATING *NAUSEA AND VOMITING THAT IS NOT CONTROLLED WITH YOUR NAUSEA MEDICATION *UNUSUAL SHORTNESS OF BREATH *UNUSUAL BRUISING OR BLEEDING *URINARY PROBLEMS (pain or burning when urinating, or frequent urination) *BOWEL PROBLEMS (unusual diarrhea, constipation, pain near the anus) TENDERNESS IN MOUTH AND THROAT WITH OR WITHOUT PRESENCE OF ULCERS (sore throat, sores in mouth, or a toothache) UNUSUAL RASH, SWELLING OR PAIN  UNUSUAL VAGINAL DISCHARGE OR ITCHING   Items with * indicate a potential emergency and should be followed up as soon as possible or go to the Emergency Department if any problems should occur.  Please show the CHEMOTHERAPY ALERT CARD or  IMMUNOTHERAPY ALERT CARD at check-in to the Emergency Department and triage nurse.  Should you have questions after your visit or need to cancel or reschedule your appointment, please contact [facility] - A DEPT OF [facility]  Dept: [phone]  and follow the prompts.  Office hours are 8:00 a.m. to 4:30 p.m. Monday - Friday. Please note that voicemails left after 4:00 p.m. may not be returned until the following business day.  We are closed weekends and major holidays. You have access to a nurse at all times for urgent questions. Please call the main number to the clinic Dept: [phone] and follow the prompts.   For any non-urgent questions, you may also contact your provider using MyChart. We now offer e-Visits for anyone 93 and older to request care online for non-urgent symptoms. For details visit [email or web address].   Also download the MyChart app! Go to the app store, search "MyChart", open the app, select [place], and log in with your MyChart username and password.

## 2024-03-12 NOTE — Progress Notes (Signed)
 [facility] OFFICE PROGRESS NOTE  Patient Care Team: Loreli Kins, MD as PCP - General (Family Medicine) Lonn Hicks, MD as Consulting Physician (Hematology and Oncology)  Assessment & Plan Adenocarcinoma of right fallopian tube [facility]) The patient was diagnosed with fallopian tube cancer in 2018 status post surgery and completion of adjuvant chemotherapy in 2019 She developed recurrent disease in 2020 after presentation with high-grade small bowel obstruction treated with carboplatin , Doxil  and bevacizumab  followed by bevacizumab  maintenance She had gallbladder surgery in November 2023 and adjacent lymph node came back positive for recurrent high-grade serous carcinoma and she was treated with carboplatin , paclitaxel  and bevacizumab  She had progressed on niraparib  and bevacizumab , Neg genetics CT imaging from April show metastatic disease to the liver, regional lymph nodes and pulmonary nodules  She is receiving palliative chemotherapy with combination of carboplatin  and gemcitabine  now Since cycle 1 of treatment, her course was complicated by severe pancytopenia requiring changes to the treatment plan The dose of treatment is modified and she is receiving treatment every other week CT imaging from July 2025 was compared with April 2025 which showed stable disease control Her tumor marker is also stable Overall, she tolerated treatment well except for intermittent severe pancytopenia Moving forward, her treatment cycle will be modified to a 5-week cycle with day 1 and day 22 instead of day 1 and day 8 of every cycle Repeat CBC today show persistent pancytopenia but she is not symptomatic We will proceed with treatment as scheduled I plan to repeat imaging study again at the end of the year Pancytopenia, acquired [facility]) She has intermittent pancytopenia due to treatment She is not symptomatic We discussed risk and benefits of pursuing treatment and she is in agreement to  proceed without delay Stage 3 chronic kidney disease, unspecified whether stage 3a or 3b CKD (HCC) We discussed importance of risk factor modification and increase oral fluid intake  Orders Placed This Encounter  Procedures   CT CHEST ABDOMEN PELVIS W CONTRAST    Standing Status:   Future    Expected Date:   04/01/2024    Expiration Date:   03/12/2025    If indicated for the ordered procedure, I authorize the administration of contrast media per Radiology protocol:   Yes    Does the patient have a contrast media/X-ray dye allergy?:   No    Preferred imaging location?:   [facility]    If indicated for the ordered procedure, I authorize the administration of oral contrast media per Radiology protocol:   No    Reason for no oral contrast::   No need oral contrast     Hicks Lonn, MD  INTERVAL HISTORY: she returns for treatment follow-up Complications related to previous cycle of chemotherapy included pancytopenia, and elevated serum creatinine  PHYSICAL EXAMINATION: ECOG PERFORMANCE STATUS: 1 - Symptomatic but completely ambulatory  Lab Results  Component Value Date   CAN125 24.0 02/16/2024   CAN125 26.6 01/29/2024   CAN125 26.0 01/09/2024      Latest Ref Rng & Units 03/12/2024   10:19 AM 02/16/2024    7:49 AM 01/29/2024    8:43 AM  CBC  WBC 4.0 - 10.5 K/uL 3.4  3.5  2.5   Hemoglobin 12.0 - 15.0 g/dL 89.1  89.6  9.8   Hematocrit 36.0 - 46.0 % 32.4  31.3  29.7   Platelets 150 - 400 K/uL 180  227  201       Chemistry  Component Value Date/Time   NA 138 03/12/2024 1019   NA 139 04/14/2017 0742   K 4.2 03/12/2024 1019   K 4.0 04/14/2017 0742   CL 103 03/12/2024 1019   CO2 25 03/12/2024 1019   CO2 21 (L) 04/14/2017 0742   BUN 13 03/12/2024 1019   BUN 13.1 04/14/2017 0742   CREATININE 1.18 (H) 03/12/2024 1019   CREATININE 0.9 04/14/2017 0742      Component Value Date/Time   CALCIUM 9.3 03/12/2024 1019   CALCIUM 9.2 04/14/2017 0742   ALKPHOS 79  03/12/2024 1019   ALKPHOS 70 04/14/2017 0742   AST 28 03/12/2024 1019   AST 17 04/14/2017 0742   ALT 9 03/12/2024 1019   ALT 14 04/14/2017 0742   BILITOT 0.4 03/12/2024 1019   BILITOT 0.37 04/14/2017 0742       There were no vitals filed for this visit. There were no vitals filed for this visit. Other relevant data reviewed during this visit included CBC, CMP

## 2024-03-13 LAB — CA 125: Cancer Antigen (CA) 125: 28.3 U/mL (ref 0.0–38.1)

## 2024-03-15 ENCOUNTER — Other Ambulatory Visit: Payer: Self-pay | Admitting: Hematology and Oncology

## 2024-03-16 ENCOUNTER — Encounter: Payer: Self-pay | Admitting: Hematology and Oncology

## 2024-03-25 ENCOUNTER — Encounter: Payer: Self-pay | Admitting: Hematology and Oncology

## 2024-03-25 ENCOUNTER — Inpatient Hospital Stay: Admitting: Hematology and Oncology

## 2024-03-25 ENCOUNTER — Inpatient Hospital Stay

## 2024-03-25 ENCOUNTER — Inpatient Hospital Stay: Attending: Hematology and Oncology

## 2024-03-25 VITALS — BP 132/69 | HR 70 | Temp 98.2°F | Resp 18 | Ht 65.0 in

## 2024-03-25 VITALS — BP 143/68 | HR 70 | Wt 145.0 lb

## 2024-03-25 DIAGNOSIS — C787 Secondary malignant neoplasm of liver and intrahepatic bile duct: Secondary | ICD-10-CM | POA: Diagnosis not present

## 2024-03-25 DIAGNOSIS — N183 Chronic kidney disease, stage 3 unspecified: Secondary | ICD-10-CM | POA: Diagnosis not present

## 2024-03-25 DIAGNOSIS — E860 Dehydration: Secondary | ICD-10-CM | POA: Insufficient documentation

## 2024-03-25 DIAGNOSIS — Z7189 Other specified counseling: Secondary | ICD-10-CM

## 2024-03-25 DIAGNOSIS — C786 Secondary malignant neoplasm of retroperitoneum and peritoneum: Secondary | ICD-10-CM | POA: Insufficient documentation

## 2024-03-25 DIAGNOSIS — Z79630 Long term (current) use of alkylating agent: Secondary | ICD-10-CM | POA: Insufficient documentation

## 2024-03-25 DIAGNOSIS — C5701 Malignant neoplasm of right fallopian tube: Secondary | ICD-10-CM

## 2024-03-25 DIAGNOSIS — Z79631 Long term (current) use of antimetabolite agent: Secondary | ICD-10-CM | POA: Diagnosis not present

## 2024-03-25 DIAGNOSIS — D61818 Other pancytopenia: Secondary | ICD-10-CM | POA: Diagnosis not present

## 2024-03-25 DIAGNOSIS — C7802 Secondary malignant neoplasm of left lung: Secondary | ICD-10-CM | POA: Insufficient documentation

## 2024-03-25 DIAGNOSIS — Z5111 Encounter for antineoplastic chemotherapy: Secondary | ICD-10-CM | POA: Insufficient documentation

## 2024-03-25 DIAGNOSIS — C772 Secondary and unspecified malignant neoplasm of intra-abdominal lymph nodes: Secondary | ICD-10-CM | POA: Insufficient documentation

## 2024-03-25 DIAGNOSIS — C7801 Secondary malignant neoplasm of right lung: Secondary | ICD-10-CM | POA: Diagnosis not present

## 2024-03-25 DIAGNOSIS — Z854A Personal history of malignant neoplasm of fallopian tube(s): Secondary | ICD-10-CM | POA: Insufficient documentation

## 2024-03-25 LAB — CBC WITH DIFFERENTIAL (CANCER CENTER ONLY)
Abs Immature Granulocytes: 0.03 K/uL (ref 0.00–0.07)
Basophils Absolute: 0 K/uL (ref 0.0–0.1)
Basophils Relative: 1 %
Eosinophils Absolute: 0.2 K/uL (ref 0.0–0.5)
Eosinophils Relative: 5 %
HCT: 33.4 % — ABNORMAL LOW (ref 36.0–46.0)
Hemoglobin: 10.9 g/dL — ABNORMAL LOW (ref 12.0–15.0)
Immature Granulocytes: 1 %
Lymphocytes Relative: 24 %
Lymphs Abs: 1 K/uL (ref 0.7–4.0)
MCH: 31.3 pg (ref 26.0–34.0)
MCHC: 32.6 g/dL (ref 30.0–36.0)
MCV: 96 fL (ref 80.0–100.0)
Monocytes Absolute: 0.7 K/uL (ref 0.1–1.0)
Monocytes Relative: 17 %
Neutro Abs: 2.3 K/uL (ref 1.7–7.7)
Neutrophils Relative %: 52 %
Platelet Count: 210 K/uL (ref 150–400)
RBC: 3.48 MIL/uL — ABNORMAL LOW (ref 3.87–5.11)
RDW: 17 % — ABNORMAL HIGH (ref 11.5–15.5)
WBC Count: 4.4 K/uL (ref 4.0–10.5)
nRBC: 0 % (ref 0.0–0.2)

## 2024-03-25 LAB — CMP (CANCER CENTER ONLY)
ALT: 16 U/L (ref 0–44)
AST: 35 U/L (ref 15–41)
Albumin: 4 g/dL (ref 3.5–5.0)
Alkaline Phosphatase: 75 U/L (ref 38–126)
Anion gap: 9 (ref 5–15)
BUN: 10 mg/dL (ref 8–23)
CO2: 26 mmol/L (ref 22–32)
Calcium: 9.3 mg/dL (ref 8.9–10.3)
Chloride: 104 mmol/L (ref 98–111)
Creatinine: 1.14 mg/dL — ABNORMAL HIGH (ref 0.44–1.00)
GFR, Estimated: 52 mL/min — ABNORMAL LOW (ref >60)
Glucose, Bld: 92 mg/dL (ref 70–99)
Potassium: 4.4 mmol/L (ref 3.5–5.1)
Sodium: 139 mmol/L (ref 135–145)
Total Bilirubin: 0.5 mg/dL (ref 0.0–1.2)
Total Protein: 6.5 g/dL (ref 6.5–8.1)

## 2024-03-25 MED ORDER — CETIRIZINE HCL 10 MG/ML IV SOLN
10.0000 mg | Freq: Once | INTRAVENOUS | Status: AC
  Administered 2024-03-25: 10 mg via INTRAVENOUS
  Filled 2024-03-25: qty 1

## 2024-03-25 MED ORDER — SODIUM CHLORIDE 0.9 % IV SOLN: INTRAVENOUS | Status: DC

## 2024-03-25 MED ORDER — APREPITANT 130 MG/18ML IV EMUL
130.0000 mg | Freq: Once | INTRAVENOUS | Status: AC
  Administered 2024-03-25: 130 mg via INTRAVENOUS
  Filled 2024-03-25: qty 18

## 2024-03-25 MED ORDER — DEXAMETHASONE SOD PHOSPHATE PF 10 MG/ML IJ SOLN
10.0000 mg | Freq: Once | INTRAMUSCULAR | Status: AC
  Administered 2024-03-25: 10 mg via INTRAVENOUS

## 2024-03-25 MED ORDER — PALONOSETRON HCL INJECTION 0.25 MG/5ML
0.2500 mg | Freq: Once | INTRAVENOUS | Status: AC
  Administered 2024-03-25: 0.25 mg via INTRAVENOUS
  Filled 2024-03-25: qty 5

## 2024-03-25 MED ORDER — SODIUM CHLORIDE 0.9% FLUSH: 10.0000 mL | INTRAVENOUS | Status: DC | PRN

## 2024-03-25 MED ORDER — SODIUM CHLORIDE 0.9 % IV SOLN
800.0000 mg/m2 | Freq: Once | INTRAVENOUS | Status: AC
  Administered 2024-03-25: 1406 mg via INTRAVENOUS
  Filled 2024-03-25: qty 36.98

## 2024-03-25 MED ORDER — SODIUM CHLORIDE 0.9 % IV SOLN
294.0000 mg | Freq: Once | INTRAVENOUS | Status: AC
  Administered 2024-03-25: 290 mg via INTRAVENOUS
  Filled 2024-03-25: qty 29

## 2024-03-25 MED ORDER — FAMOTIDINE IN NACL 20-0.9 MG/50ML-% IV SOLN
20.0000 mg | Freq: Once | INTRAVENOUS | Status: AC
  Administered 2024-03-25: 20 mg via INTRAVENOUS
  Filled 2024-03-25: qty 50

## 2024-03-25 NOTE — Assessment & Plan Note (Addendum)
 She has intermittent pancytopenia due to treatment She is not symptomatic We will proceed with treatment without delay

## 2024-03-25 NOTE — Progress Notes (Signed)
 [facility] OFFICE PROGRESS NOTE  Patient Care Team: Loreli Kins, MD as PCP - General (Family Medicine) Lonn Hicks, MD as Consulting Physician (Hematology and Oncology)  Assessment & Plan Adenocarcinoma of right fallopian tube [facility]) The patient was diagnosed with fallopian tube cancer in 2018 status post surgery and completion of adjuvant chemotherapy in 2019 She developed recurrent disease in 2020 after presentation with high-grade small bowel obstruction treated with carboplatin , Doxil  and bevacizumab  followed by bevacizumab  maintenance She had gallbladder surgery in November 2023 and adjacent lymph node came back positive for recurrent high-grade serous carcinoma and she was treated with carboplatin , paclitaxel  and bevacizumab  She had progressed on niraparib  and bevacizumab , Neg genetics CT imaging from April show metastatic disease to the liver, regional lymph nodes and pulmonary nodules  She is receiving palliative chemotherapy with combination of carboplatin  and gemcitabine  now Since cycle 1 of treatment, her course was complicated by severe pancytopenia requiring changes to the treatment plan The dose of treatment is modified and she is receiving treatment every other week CT imaging from July 2025 was compared with April 2025 which showed stable disease control Her tumor marker is also stable Overall, she tolerated treatment well except for intermittent severe pancytopenia Moving forward, her treatment cycle will be modified to a 5-week cycle with day 1 and day 22 instead of day 1 and day 8 of every cycle Repeat CBC today show persistent pancytopenia but she is not symptomatic We will proceed with treatment as scheduled CT scan will be performed next week If continues to show improvement, I plan to discontinue carboplatin  in the future and she will just continue on single agent gemcitabine  in the maintenance fashion Stage 3 chronic kidney disease, unspecified  whether stage 3a or 3b CKD (HCC) We discussed importance of risk factor modification and increase oral fluid intake Pancytopenia, acquired (HCC) She has intermittent pancytopenia due to treatment She is not symptomatic We will proceed with treatment without delay  No orders of the defined types were placed in this encounter.    Hicks Lonn, MD  INTERVAL HISTORY: she returns for treatment follow-up Complications related to previous cycle of chemotherapy included anemia,, elevated BP, and elevated serum creatinine  PHYSICAL EXAMINATION: ECOG PERFORMANCE STATUS: 1 - Symptomatic but completely ambulatory  Lab Results  Component Value Date   CAN125 28.3 03/12/2024   CAN125 24.0 02/16/2024   CAN125 26.6 01/29/2024      Latest Ref Rng & Units 03/25/2024    9:27 AM 03/12/2024   10:19 AM 02/16/2024    7:49 AM  CBC  WBC 4.0 - 10.5 K/uL 4.4  3.4  3.5   Hemoglobin 12.0 - 15.0 g/dL 89.0  89.1  89.6   Hematocrit 36.0 - 46.0 % 33.4  32.4  31.3   Platelets 150 - 400 K/uL 210  180  227       Chemistry      Component Value Date/Time   NA 139 03/25/2024 0927   NA 139 04/14/2017 0742   K 4.4 03/25/2024 0927   K 4.0 04/14/2017 0742   CL 104 03/25/2024 0927   CO2 26 03/25/2024 0927   CO2 21 (L) 04/14/2017 0742   BUN 10 03/25/2024 0927   BUN 13.1 04/14/2017 0742   CREATININE 1.14 (H) 03/25/2024 0927   CREATININE 0.9 04/14/2017 0742      Component Value Date/Time   CALCIUM 9.3 03/25/2024 0927   CALCIUM 9.2 04/14/2017 0742   ALKPHOS 75 03/25/2024 0927   ALKPHOS  70 04/14/2017 0742   AST 35 03/25/2024 0927   AST 17 04/14/2017 0742   ALT 16 03/25/2024 0927   ALT 14 04/14/2017 0742   BILITOT 0.5 03/25/2024 0927   BILITOT 0.37 04/14/2017 0742       Vitals:   03/25/24 1005  BP: 132/69  Pulse: 70  Resp: 18  Temp: 98.2 F (36.8 C)  SpO2: 96%   There were no vitals filed for this visit. Other relevant data reviewed during this visit included CBC, CMP

## 2024-03-25 NOTE — Patient Instructions (Signed)
 [facility] - A DEPT OF [facility]  Discharge Instructions: Thank you for choosing [facility] to provide your oncology and hematology care.   If you have a lab appointment with the Cancer Center, please go directly to the Cancer Center and check in at the registration area.   Wear comfortable clothing and clothing appropriate for easy access to any Portacath or PICC line.   We strive to give you quality time with your provider. You may need to reschedule your appointment if you arrive late (15 or more minutes).  Arriving late affects you and other patients whose appointments are after yours.  Also, if you miss three or more appointments without notifying the office, you may be dismissed from the clinic at the provider's discretion.      For prescription refill requests, have your pharmacy contact our office and allow 72 hours for refills to be completed.    Today you received the following chemotherapy and/or immunotherapy agents gemzar and carboplatin      To help prevent nausea and vomiting after your treatment, we encourage you to take your nausea medication as directed.  BELOW ARE SYMPTOMS THAT SHOULD BE REPORTED IMMEDIATELY: *FEVER GREATER THAN 100.4 F (38 C) OR HIGHER *CHILLS OR SWEATING *NAUSEA AND VOMITING THAT IS NOT CONTROLLED WITH YOUR NAUSEA MEDICATION *UNUSUAL SHORTNESS OF BREATH *UNUSUAL BRUISING OR BLEEDING *URINARY PROBLEMS (pain or burning when urinating, or frequent urination) *BOWEL PROBLEMS (unusual diarrhea, constipation, pain near the anus) TENDERNESS IN MOUTH AND THROAT WITH OR WITHOUT PRESENCE OF ULCERS (sore throat, sores in mouth, or a toothache) UNUSUAL RASH, SWELLING OR PAIN  UNUSUAL VAGINAL DISCHARGE OR ITCHING   Items with * indicate a potential emergency and should be followed up as soon as possible or go to the Emergency Department if any problems should occur.  Please show the CHEMOTHERAPY ALERT CARD or  IMMUNOTHERAPY ALERT CARD at check-in to the Emergency Department and triage nurse.  Should you have questions after your visit or need to cancel or reschedule your appointment, please contact [facility] - A DEPT OF [facility]  Dept: [phone]  and follow the prompts.  Office hours are 8:00 a.m. to 4:30 p.m. Monday - Friday. Please note that voicemails left after 4:00 p.m. may not be returned until the following business day.  We are closed weekends and major holidays. You have access to a nurse at all times for urgent questions. Please call the main number to the clinic Dept: [phone] and follow the prompts.   For any non-urgent questions, you may also contact your provider using MyChart. We now offer e-Visits for anyone 81 and older to request care online for non-urgent symptoms. For details visit [email or web address].   Also download the MyChart app! Go to the app store, search MyChart, open the app, select [facility], and log in with your MyChart username and password.

## 2024-03-25 NOTE — Assessment & Plan Note (Addendum)
 The patient was diagnosed with fallopian tube cancer in 2018 status post surgery and completion of adjuvant chemotherapy in 2019 She developed recurrent disease in 2020 after presentation with high-grade small bowel obstruction treated with carboplatin , Doxil  and bevacizumab  followed by bevacizumab  maintenance She had gallbladder surgery in November 2023 and adjacent lymph node came back positive for recurrent high-grade serous carcinoma and she was treated with carboplatin , paclitaxel  and bevacizumab  She had progressed on niraparib  and bevacizumab , Neg genetics CT imaging from April show metastatic disease to the liver, regional lymph nodes and pulmonary nodules  She is receiving palliative chemotherapy with combination of carboplatin  and gemcitabine  now Since cycle 1 of treatment, her course was complicated by severe pancytopenia requiring changes to the treatment plan The dose of treatment is modified and she is receiving treatment every other week CT imaging from July 2025 was compared with April 2025 which showed stable disease control Her tumor marker is also stable Overall, she tolerated treatment well except for intermittent severe pancytopenia Moving forward, her treatment cycle will be modified to a 5-week cycle with day 1 and day 22 instead of day 1 and day 8 of every cycle Repeat CBC today show persistent pancytopenia but she is not symptomatic We will proceed with treatment as scheduled CT scan will be performed next week If continues to show improvement, I plan to discontinue carboplatin  in the future and she will just continue on single agent gemcitabine  in the maintenance fashion

## 2024-03-25 NOTE — Assessment & Plan Note (Addendum)
 We discussed importance of risk factor modification and increase oral fluid intake

## 2024-03-28 ENCOUNTER — Encounter: Payer: Self-pay | Admitting: Hematology and Oncology

## 2024-03-29 ENCOUNTER — Other Ambulatory Visit: Payer: Self-pay

## 2024-03-29 ENCOUNTER — Inpatient Hospital Stay

## 2024-03-29 ENCOUNTER — Telehealth: Payer: Self-pay

## 2024-03-29 ENCOUNTER — Other Ambulatory Visit: Payer: Self-pay | Admitting: Hematology and Oncology

## 2024-03-29 DIAGNOSIS — Z7189 Other specified counseling: Secondary | ICD-10-CM

## 2024-03-29 DIAGNOSIS — C5701 Malignant neoplasm of right fallopian tube: Secondary | ICD-10-CM

## 2024-03-29 DIAGNOSIS — Z5111 Encounter for antineoplastic chemotherapy: Secondary | ICD-10-CM | POA: Diagnosis not present

## 2024-03-29 MED ORDER — SODIUM CHLORIDE 0.9 % IV SOLN: Freq: Once | INTRAVENOUS | Status: AC

## 2024-03-29 MED ORDER — ONDANSETRON HCL 8 MG PO TABS: 8.0000 mg | ORAL_TABLET | Freq: Three times a day (TID) | ORAL | 3 refills | Status: AC | PRN

## 2024-03-29 MED ORDER — ALUM & MAG HYDROXIDE-SIMETH 200-200-20 MG/5ML PO SUSP
15.0000 mL | Freq: Once | ORAL | Status: AC
  Administered 2024-03-29: 15 mL via ORAL
  Filled 2024-03-29: qty 30

## 2024-03-29 MED ORDER — ONDANSETRON HCL 4 MG/2ML IJ SOLN
8.0000 mg | Freq: Once | INTRAMUSCULAR | Status: AC
  Administered 2024-03-29: 8 mg via INTRAVENOUS
  Filled 2024-03-29: qty 4

## 2024-03-29 NOTE — Patient Instructions (Signed)
 Fluids Given Through an IV (IV Infusion Therapy): What to Expect IV infusion therapy is a treatment to deliver a fluid, called an infusion, into a vein. You may have IV infusion to get: Fluids. Medicines. Nutrition. Chemotherapy. This is medicines to stop or slow down cancer cells. Blood or blood products. Dye that is given before an MRI or a CT scan. This is called contrast dye. Tell a health care provider about: Any allergies you have. This includes allergies to anesthesia or dyes. All medicines you take. These include vitamins, herbs, eye drops, and creams. Any bleeding problems you have. Any surgeries you've had, including if you've had lymph nodes taken out of your armpit or if you have a arteriovenous fistula for dialysis. Any medical problems you have. Whether you're pregnant or may be pregnant. Whether you've used IV drugs. What are the risks? Your health care provider will talk with you about risks. These may include: Pain, bruising, or bleeding. Infection. The IV leaking or moving out of place. Damage to blood vessels or nerves. Allergic reactions to medicines or dyes. A blood clot. An air bubble in the vein, also called an air embolism. What happens before the procedure? Eat and drink only as you've been told. Ask about changing or stopping: Any medicines you take. Any vitamins, herbs, or supplements you take. What happens during the procedure?     Placing the catheter Your skin at the IV site will be washed with fluid that kills germs. This will help prevent infection. IV infusion therapy starts with a procedure to place a soft tube called a catheter into a vein. An IV tube will be attached to the catheter to let the infusion flow into your blood. Your catheter may be placed: Into a vein that is usually in the bend of the elbow, forearm, or back of the hand. This is called a peripheral IV catheter. This may need to be put into a vein each time you get an  infusion. Into a vein near your elbow. This is called a midline catheter or a peripherally inserted central catheter (PICC). These types of catheters may stay in place for weeks or months at a time so you can receive repeated infusions through it. Into a vein near your neck that leads to your heart. This is called a non-tunneled catheter. This is only used for short amounts of time because it can cause infection. Through the skin of your chest and into a large vein that leads to your heart. This is called a tunneled catheter. This may stay in your body for months or years. Into an implanted port. An implanted port is a device that is surgically inserted under the skin of the chest to provide long-term IV access. The catheter will connect the port to a large vein in the chest or upper arm. A port may be kept in place for many months or years. Each time you have an infusion, a needle will be inserted through your skin to connect the catheter to the port. Doing the infusion To start the infusion, your provider will: Attach the IV tubing to your catheter. Use a tape or a bandage to hold the IV in place against your skin. An IV pump may be used to control the flow of the IV infusion. During the infusion, your provider will check the area to make sure: There is no bleeding, swelling, or pain. Your IV infusion is flowing correctly. After the infusion, your provider will: Take off the bandage  or tape. Disconnect the tubing from the catheter. Remove the catheter, if you have a peripheral IV. Apply pressure over the IV insertion site to stop bleeding, then cover the area with a bandage. If you have an implanted port, PICC, non-tunneled, or tunneled catheter, your catheter may remain in place. This depends on how many times you will need treatment, your medical condition, and what type of catheter you have. These steps may vary. Ask what you can expect. What can I expect after the procedure? You may be  watched closely until you leave. This includes checking your pain level, blood pressure, heart rate, and breathing rate. Your provider will check to make sure there are no signs of infection. Follow these instructions at home: Take your medicines only as told. Change or take off your bandage as told by your provider. Ask what things are safe for you to do at home. Ask when you can go back to work or school. Do not take baths, swim, or use a hot tub until you're told it's OK. Ask if you can shower. Check your IV insertion site every day for signs of infection. Check for: Redness, swelling, or pain. Fluid or blood. If fluid or blood drains from your IV site, use your hands to press down firmly on the area for a minute or two. Doing this should stop the bleeding. Warmth. Pus or a bad smell. Contact a health care provider if: You have signs of infection around your IV site. You have fluid or blood coming from your IV site that does not stop after you put pressure to the site. You have a rash or blisters. You have itchy, red, swollen areas of skin called hives. Get help right away if: You have a fever or chills. You have chest pain. You have trouble breathing. This information is not intended to replace advice given to you by your health care provider. Make sure you discuss any questions you have with your health care provider. Document Revised: 10/01/2022 Document Reviewed: 10/01/2022 Elsevier Patient Education  2024 [employer].

## 2024-03-29 NOTE — Telephone Encounter (Signed)
 Called her back and scheduled appt today for IV fluids at 1:30 pm today. She is aware of appt and will sip on fluids and try to eat small amounts.  FYI

## 2024-03-29 NOTE — Telephone Encounter (Signed)
 Called regarding mychart message and vomiting over the weekend. Vomiting stopped last night but she has only ate a small amount since Friday. Complaining of abdominal pain from vomiting and dry heaves. Requesting IV fluids today. Sent a message to charge nurse requesting IV fluids today.  She will try to eat some crackers and sip on fluids until the office calls her back.

## 2024-03-30 ENCOUNTER — Inpatient Hospital Stay

## 2024-03-30 VITALS — BP 141/63 | HR 77 | Temp 98.8°F | Resp 15

## 2024-03-30 DIAGNOSIS — Z5111 Encounter for antineoplastic chemotherapy: Secondary | ICD-10-CM | POA: Diagnosis not present

## 2024-03-30 DIAGNOSIS — C5701 Malignant neoplasm of right fallopian tube: Secondary | ICD-10-CM

## 2024-03-30 DIAGNOSIS — Z7189 Other specified counseling: Secondary | ICD-10-CM

## 2024-03-30 MED ORDER — SODIUM CHLORIDE 0.9 % IV SOLN: Freq: Once | INTRAVENOUS | Status: AC

## 2024-03-30 NOTE — Patient Instructions (Signed)
 Nausea and Vomiting, Adult Nausea is feeling that you have an upset stomach and that you are about to vomit. Vomiting is when food in your stomach forcefully comes out of your mouth. Vomiting can make you feel weak. If you vomit, or if you are not able to drink enough fluids, you may not have enough water in your body (get dehydrated). If you do not have enough water in your body, you may: Feel tired. Feel thirsty. Have a dry mouth. Have cracked lips. Pee (urinate) less often. Older adults and people with other diseases or a weak body defense system (immune system) are at higher risk for not having enough water in the body. If you feel like you may vomit or you vomit, it is important to follow instructions from your doctor about how to take care of yourself. Follow these instructions at home: Watch your symptoms for any changes. Tell your doctor about them. Eating and drinking     Take an ORS (oral rehydration solution). This is a drink that is sold at pharmacies and stores. Drink clear fluids in small amounts as you are able, such as: Water. Ice chips. Fruit juice that has water added (diluted fruit juice). Low-calorie sports drinks. Eat bland, easy-to-digest foods in small amounts as you are able, such as: Bananas. Applesauce. Rice. Low-fat (lean) meats. Toast. Crackers. Avoid drinking fluids that have a lot of sugar or caffeine in them. This includes energy drinks, sports drinks, and soda. Avoid alcohol. Avoid spicy or fatty foods. General instructions Take over-the-counter and prescription medicines only as told by your doctor. Drink enough fluid to keep your pee (urine) pale yellow. Wash your hands often with soap and water for at least 20 seconds. If you cannot use soap and water, use hand sanitizer. Make sure that everyone in your home washes their hands well and often. Rest at home until you feel better. Watch your condition for any changes. Take slow and deep breaths  when you feel like you may vomit. Keep all follow-up visits. Contact a doctor if: Your symptoms get worse. You have new symptoms. You have a fever. You cannot drink fluids without vomiting. You feel like you may vomit for more than 2 days. You feel light-headed or dizzy. You have a headache. You have muscle cramps. You have a rash. You have pain while peeing. Get help right away if: You have pain in your chest, neck, arm, or jaw. You feel very weak or you faint. You vomit again and again. You have vomit that is bright red or looks like black coffee grounds. You have bloody or black poop (stools) or poop that looks like tar. You have a very bad headache, a stiff neck, or both. You have very bad pain, cramping, or bloating in your belly (abdomen). You have trouble breathing. You are breathing very quickly. Your heart is beating very quickly. Your skin feels cold and clammy. You feel confused. You have signs of losing too much water in your body, such as: Dark pee, very little pee, or no pee. Cracked lips. Dry mouth. Sunken eyes. Sleepiness. Weakness. These symptoms may be an emergency. Get help right away. Call 911. Do not wait to see if the symptoms will go away. Do not drive yourself to the hospital. Summary Nausea is feeling that you have an upset stomach and that you are about to vomit. Vomiting is when food in your stomach comes out of your mouth. Follow instructions from your doctor about eating and drinking.  Take over-the-counter and prescription medicines only as told by your doctor. Contact your doctor if your symptoms get worse or you have new symptoms. Keep all follow-up visits. This information is not intended to replace advice given to you by your health care provider. Make sure you discuss any questions you have with your health care provider. Document Revised: 10/13/2020 Document Reviewed: 10/13/2020 Elsevier Patient Education  2024 [employer].

## 2024-03-31 ENCOUNTER — Encounter: Payer: Self-pay | Admitting: Hematology and Oncology

## 2024-04-01 ENCOUNTER — Ambulatory Visit (HOSPITAL_COMMUNITY): Admission: RE | Admit: 2024-04-01 | Discharge: 2024-04-01 | Attending: Hematology and Oncology

## 2024-04-01 ENCOUNTER — Other Ambulatory Visit: Payer: Self-pay

## 2024-04-01 ENCOUNTER — Inpatient Hospital Stay

## 2024-04-01 ENCOUNTER — Other Ambulatory Visit: Payer: Self-pay | Admitting: Hematology and Oncology

## 2024-04-01 VITALS — BP 172/74 | HR 82 | Temp 98.8°F | Resp 14 | Wt 146.5 lb

## 2024-04-01 DIAGNOSIS — C5701 Malignant neoplasm of right fallopian tube: Secondary | ICD-10-CM | POA: Diagnosis present

## 2024-04-01 DIAGNOSIS — Z5111 Encounter for antineoplastic chemotherapy: Secondary | ICD-10-CM | POA: Diagnosis not present

## 2024-04-01 DIAGNOSIS — Z7189 Other specified counseling: Secondary | ICD-10-CM

## 2024-04-01 MED ORDER — SODIUM CHLORIDE 0.9 % IV SOLN: Freq: Once | INTRAVENOUS | Status: AC

## 2024-04-01 MED ORDER — IOHEXOL 300 MG/ML  SOLN
100.0000 mL | Freq: Once | INTRAMUSCULAR | Status: AC | PRN
  Administered 2024-04-01: 100 mL via INTRAVENOUS

## 2024-04-01 MED ORDER — SODIUM CHLORIDE (PF) 0.9 % IJ SOLN
INTRAMUSCULAR | Status: AC
  Filled 2024-04-01: qty 50

## 2024-04-01 MED ORDER — ONDANSETRON HCL 4 MG/2ML IJ SOLN: 8.0000 mg | Freq: Once | INTRAMUSCULAR | Status: DC

## 2024-04-01 MED ORDER — ONDANSETRON 4 MG PO TBDP: 4.0000 mg | ORAL_TABLET | Freq: Three times a day (TID) | ORAL | 0 refills | Status: DC | PRN

## 2024-04-01 NOTE — Progress Notes (Signed)
 Per Mallie ORN PA-C OK to infuse IVFs at a rate of 750 mL/hr today.

## 2024-04-01 NOTE — Patient Instructions (Signed)
Nausea, Adult Nausea is feeling like you may vomit. Feeling like you may vomit is usually not serious, but it may be an early sign of a more serious medical problem. Vomiting is when stomach contents forcefully come out of your mouth. If you vomit, or if you are not able to drink enough fluids, you may not have enough water in your body (get dehydrated). If you do not have enough water in your body, you may: Feel tired. Feel thirsty. Have a dry mouth. Have cracked lips. Pee (urinate) less often. Older adults and people who have other diseases or a weak body defense system (immune system) have a higher risk of not having enough water in the body. The main goals of treating this condition are: To relieve your nausea. To ensure your nausea occurs less often. To prevent vomiting and losing too much fluid. Follow these instructions at home: Watch your symptoms for any changes. Tell your doctor about them. Eating and drinking     Take an ORS (oral rehydration solution). This is a drink that is sold at pharmacies and stores. Drink clear fluids in small amounts as you are able. These include: Water. Ice chips. Fruit juice that has water added (diluted fruit juice). Low-calorie sports drinks. Eat bland, easy-to-digest foods in small amounts as you are able, such as: Bananas. Applesauce. Rice. Low-fat (lean) meats. Toast. Crackers. Avoid drinking fluids that have a lot of sugar or caffeine in them. This includes energy drinks, sports drinks, and soda. Avoid alcohol. Avoid spicy or fatty foods. General instructions Take over-the-counter and prescription medicines only as told by your doctor. Rest at home while you get better. Drink enough fluid to keep your pee (urine) pale yellow. Take slow and deep breaths when you feel like you may vomit. Avoid food or things that have strong smells. Wash your hands often with soap and water for at least 20 seconds. If you cannot use soap and water,  use hand sanitizer. Make sure that everyone in your home washes their hands well and often. Keep all follow-up visits. Contact a doctor if: You feel worse. You feel like you may vomit and this lasts for more than 2 days. You vomit. You are not able to drink fluids without vomiting. You have new symptoms. You have a fever. You have a headache. You have muscle cramps. You have a rash. You have pain while peeing. You feel light-headed or dizzy. Get help right away if: You have pain in your chest, neck, arm, or jaw. You feel very weak or you faint. You have vomit that is bright red or looks like coffee grounds. You have bloody or black poop (stools) or poop that looks like tar. You have a very bad headache, a stiff neck, or both. You have very bad pain, cramping, or bloating in your belly (abdomen). You have trouble breathing or you are breathing very quickly. Your heart is beating very quickly. Your skin feels cold and clammy. You feel confused. You have signs of losing too much water in your body, such as: Dark pee, very little pee, or no pee. Cracked lips. Dry mouth. Sunken eyes. Sleepiness. Weakness. These symptoms may be an emergency. Get help right away. Call 911. Do not wait to see if the symptoms will go away. Do not drive yourself to the hospital. Summary Nausea is feeling like you are about vomit. If you vomit, or if you are not able to drink enough fluids, you may not have enough water in   your body (get dehydrated). Eat and drink what your doctor tells you. Take over-the-counter and prescription medicines only as told by your doctor. Contact a doctor right away if your symptoms get worse or you have new symptoms. Keep all follow-up visits. This information is not intended to replace advice given to you by your health care provider. Make sure you discuss any questions you have with your health care provider. Document Revised: 10/13/2020 Document Reviewed:  10/13/2020 Elsevier Patient Education  2024 Elsevier Inc.  

## 2024-04-02 ENCOUNTER — Inpatient Hospital Stay

## 2024-04-02 VITALS — BP 143/82 | HR 78 | Temp 97.6°F | Resp 12 | Ht 65.0 in | Wt 148.9 lb

## 2024-04-02 DIAGNOSIS — Z7189 Other specified counseling: Secondary | ICD-10-CM

## 2024-04-02 DIAGNOSIS — Z5111 Encounter for antineoplastic chemotherapy: Secondary | ICD-10-CM | POA: Diagnosis not present

## 2024-04-02 DIAGNOSIS — C5701 Malignant neoplasm of right fallopian tube: Secondary | ICD-10-CM

## 2024-04-02 MED ORDER — SODIUM CHLORIDE 0.9 % IV SOLN: Freq: Once | INTRAVENOUS | Status: AC

## 2024-04-05 ENCOUNTER — Telehealth: Payer: Self-pay

## 2024-04-05 ENCOUNTER — Telehealth: Payer: Self-pay | Admitting: Oncology

## 2024-04-05 ENCOUNTER — Encounter: Payer: Self-pay | Admitting: Oncology

## 2024-04-05 ENCOUNTER — Encounter: Payer: Self-pay | Admitting: Hematology and Oncology

## 2024-04-05 ENCOUNTER — Inpatient Hospital Stay

## 2024-04-05 ENCOUNTER — Inpatient Hospital Stay: Admitting: Hematology and Oncology

## 2024-04-05 VITALS — BP 197/84 | HR 86 | Temp 97.8°F | Resp 16 | Wt 147.0 lb

## 2024-04-05 DIAGNOSIS — Z5111 Encounter for antineoplastic chemotherapy: Secondary | ICD-10-CM | POA: Diagnosis not present

## 2024-04-05 DIAGNOSIS — Z7189 Other specified counseling: Secondary | ICD-10-CM

## 2024-04-05 DIAGNOSIS — C5701 Malignant neoplasm of right fallopian tube: Secondary | ICD-10-CM

## 2024-04-05 DIAGNOSIS — C786 Secondary malignant neoplasm of retroperitoneum and peritoneum: Secondary | ICD-10-CM

## 2024-04-05 MED ORDER — DEXAMETHASONE 4 MG PO TABS: ORAL_TABLET | ORAL | 1 refills | Status: AC

## 2024-04-05 MED ORDER — SODIUM CHLORIDE 0.9 % IV SOLN: Freq: Once | INTRAVENOUS | Status: AC

## 2024-04-05 NOTE — Progress Notes (Signed)
 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[facility]) The patient was diagnosed with fallopian tube cancer in 2018 status post surgery and completion of adjuvant chemotherapy in 2019 She developed recurrent disease in 2020 after presentation with high-grade small bowel obstruction treated with carboplatin , Doxil  and bevacizumab  followed by bevacizumab  maintenance She had gallbladder surgery in November 2023 and adjacent lymph node came back positive for recurrent high-grade serous carcinoma  Tumor marker CA125 is not helpful  I reviewed CT imaging that was done recently in December 2025 which show recurrent ascites and metastatic disease in her lungs and peritoneum with ileus/carcinomatosis She is symptomatic requiring IV fluids and antiemetics Overall, the patient has progressed on carboplatin , gemcitabine , Doxil , bevacizumab  and niraparib  We discussed treatment options I will send her tissue from 2023 for molecular testing We discussed risk and benefits of docetaxel, paclitaxel  or topotecan Ultimately, she made informed decision to pursue docetaxel Given severe pancytopenia, I will reduce the dose of chemotherapy upfront at 50 mg/m I plan to repeat imaging study after 3 cycles of treatment Goals of care, counseling/discussion We have numerous goals of care discussions in the past The patient is aware that treatment goal is palliative Secondary malignant neoplasm of omentum (HCC) She had peritoneal disease and prior history of bowel obstruction I am concerned that she might be developing bowel obstruction We discussed modified diet we have low residue food/fiber I educated the patient and family signs and symptoms to watch out for bowel obstruction  Orders Placed This Encounter   Procedures   CBC with Differential (Cancer Center Only)    Standing Status:   Future    Expected Date:   04/12/2024    Expiration Date:   04/12/2025   CMP (Cancer Center only)    Standing Status:   Future    Expected Date:   04/12/2024    Expiration Date:   04/12/2025   CBC with Differential (Cancer Center Only)    Standing Status:   Future    Expected Date:   05/03/2024    Expiration Date:   05/03/2025   CMP (Cancer Center only)    Standing Status:   Future    Expected Date:   05/03/2024    Expiration Date:   05/03/2025   CBC with Differential (Cancer Center Only)    Standing Status:   Future    Expected Date:   05/24/2024    Expiration Date:   05/24/2025   CMP (Cancer Center only)    Standing Status:   Future    Expected Date:   05/24/2024    Expiration Date:   05/24/2025   CBC with Differential (Cancer Center Only)    Standing Status:   Future    Expected Date:   06/14/2024    Expiration Date:   06/14/2025   CMP (Cancer Center only)    Standing Status:   Future    Expected Date:   06/14/2024    Expiration Date:   06/14/2025     Hicks Lonn, MD  INTERVAL HISTORY: she returns for surveillance follow-up and review of test results Over the past week, she had recurrent nausea, vomiting and dehydration requiring IV fluid support I reviewed CT imaging with the patient and her husband which showed disease progression We discussed treatment options  PHYSICAL EXAMINATION: ECOG PERFORMANCE STATUS: 1 -  Symptomatic but completely ambulatory  There were no vitals filed for this visit. There were no vitals filed for this visit.  Relevant data reviewed during this visit included CT imaging

## 2024-04-05 NOTE — Assessment & Plan Note (Addendum)
 The patient was diagnosed with fallopian tube cancer in 2018 status post surgery and completion of adjuvant chemotherapy in 2019 She developed recurrent disease in 2020 after presentation with high-grade small bowel obstruction treated with carboplatin , Doxil  and bevacizumab  followed by bevacizumab  maintenance She had gallbladder surgery in November 2023 and adjacent lymph node came back positive for recurrent high-grade serous carcinoma  Tumor marker CA125 is not helpful  I reviewed CT imaging that was done recently in December 2025 which show recurrent ascites and metastatic disease in her lungs and peritoneum with ileus/carcinomatosis She is symptomatic requiring IV fluids and antiemetics Overall, the patient has progressed on carboplatin , gemcitabine , Doxil , bevacizumab  and niraparib  We discussed treatment options I will send her tissue from 2023 for molecular testing We discussed risk and benefits of docetaxel, paclitaxel  or topotecan Ultimately, she made informed decision to pursue docetaxel Given severe pancytopenia, I will reduce the dose of chemotherapy upfront at 50 mg/m I plan to repeat imaging study after 3 cycles of treatment

## 2024-04-05 NOTE — Telephone Encounter (Signed)
 Called [facility] regarding [facility] testing.  They said all ARS accessions are now at [facility].  Left a message for Jon Barb at [facility] [phone]) to make sure abdominal wall sample is sent to [facility] for testing on accession [id number].

## 2024-04-05 NOTE — Assessment & Plan Note (Addendum)
 She had peritoneal disease and prior history of bowel obstruction I am concerned that she might be developing bowel obstruction We discussed modified diet we have low residue food/fiber I educated the patient and family signs and symptoms to watch out for bowel obstruction

## 2024-04-05 NOTE — Telephone Encounter (Signed)
 Called and scheduled appt with Dr. Lonn after IV fluids. She is aware of appt to discuss CT.

## 2024-04-05 NOTE — Patient Instructions (Signed)

## 2024-04-05 NOTE — Assessment & Plan Note (Addendum)
 We have numerous goals of care discussions in the past The patient is aware that treatment goal is palliative

## 2024-04-05 NOTE — Progress Notes (Signed)
 Sent order requisition on accession [id number] (abdominal wall biopsy) per Dr. Lonn.

## 2024-04-05 NOTE — Telephone Encounter (Signed)
 Called and left her a message asking her to call the office back regarding mychart message.

## 2024-04-08 ENCOUNTER — Other Ambulatory Visit

## 2024-04-08 ENCOUNTER — Ambulatory Visit

## 2024-04-08 ENCOUNTER — Ambulatory Visit: Admitting: Hematology and Oncology

## 2024-04-12 ENCOUNTER — Inpatient Hospital Stay

## 2024-04-12 VITALS — BP 201/93 | HR 72 | Temp 98.3°F | Resp 16 | Ht 65.0 in | Wt 143.5 lb

## 2024-04-12 DIAGNOSIS — Z5111 Encounter for antineoplastic chemotherapy: Secondary | ICD-10-CM | POA: Diagnosis not present

## 2024-04-12 DIAGNOSIS — Z7189 Other specified counseling: Secondary | ICD-10-CM

## 2024-04-12 DIAGNOSIS — C5701 Malignant neoplasm of right fallopian tube: Secondary | ICD-10-CM

## 2024-04-12 LAB — CBC WITH DIFFERENTIAL (CANCER CENTER ONLY)
Abs Immature Granulocytes: 0.03 K/uL (ref 0.00–0.07)
Basophils Absolute: 0 K/uL (ref 0.0–0.1)
Basophils Relative: 0 %
Eosinophils Absolute: 0 K/uL (ref 0.0–0.5)
Eosinophils Relative: 0 %
HCT: 28.8 % — ABNORMAL LOW (ref 36.0–46.0)
Hemoglobin: 9.6 g/dL — ABNORMAL LOW (ref 12.0–15.0)
Immature Granulocytes: 1 %
Lymphocytes Relative: 25 %
Lymphs Abs: 0.6 K/uL — ABNORMAL LOW (ref 0.7–4.0)
MCH: 32 pg (ref 26.0–34.0)
MCHC: 33.3 g/dL (ref 30.0–36.0)
MCV: 96 fL (ref 80.0–100.0)
Monocytes Absolute: 0.4 K/uL (ref 0.1–1.0)
Monocytes Relative: 20 %
Neutro Abs: 1.2 K/uL — ABNORMAL LOW (ref 1.7–7.7)
Neutrophils Relative %: 54 %
Platelet Count: 181 K/uL (ref 150–400)
RBC: 3 MIL/uL — ABNORMAL LOW (ref 3.87–5.11)
RDW: 17.7 % — ABNORMAL HIGH (ref 11.5–15.5)
WBC Count: 2.3 K/uL — ABNORMAL LOW (ref 4.0–10.5)
nRBC: 0.9 % — ABNORMAL HIGH (ref 0.0–0.2)

## 2024-04-12 LAB — CMP (CANCER CENTER ONLY)
ALT: 14 U/L (ref 0–44)
AST: 33 U/L (ref 15–41)
Albumin: 4.1 g/dL (ref 3.5–5.0)
Alkaline Phosphatase: 77 U/L (ref 38–126)
Anion gap: 13 (ref 5–15)
BUN: 16 mg/dL (ref 8–23)
CO2: 21 mmol/L — ABNORMAL LOW (ref 22–32)
Calcium: 9 mg/dL (ref 8.9–10.3)
Chloride: 104 mmol/L (ref 98–111)
Creatinine: 1.26 mg/dL — ABNORMAL HIGH (ref 0.44–1.00)
GFR, Estimated: 46 mL/min — ABNORMAL LOW
Glucose, Bld: 134 mg/dL — ABNORMAL HIGH (ref 70–99)
Potassium: 4.5 mmol/L (ref 3.5–5.1)
Sodium: 138 mmol/L (ref 135–145)
Total Bilirubin: 0.4 mg/dL (ref 0.0–1.2)
Total Protein: 6.5 g/dL (ref 6.5–8.1)

## 2024-04-12 MED ORDER — DEXAMETHASONE SOD PHOSPHATE PF 10 MG/ML IJ SOLN
10.0000 mg | Freq: Once | INTRAMUSCULAR | Status: AC
  Administered 2024-04-12: 10 mg via INTRAVENOUS

## 2024-04-12 MED ORDER — SODIUM CHLORIDE 0.9 % IV SOLN
50.0000 mg/m2 | Freq: Once | INTRAVENOUS | Status: AC
  Administered 2024-04-12: 80 mg via INTRAVENOUS
  Filled 2024-04-12: qty 8

## 2024-04-12 MED ORDER — SODIUM CHLORIDE 0.9 % IV SOLN: INTRAVENOUS | Status: DC

## 2024-04-12 NOTE — Progress Notes (Signed)
 Patients bp elevated after treatment, pt asymptomatic denies sob, denies chest pain, denies headache, per Dr pasam ok to dc home and take  home bp medication. pt will take clonidine  as prescribed by pcp when home and retake bp, Pt ambulated from infusion area with no complaints.

## 2024-04-12 NOTE — Patient Instructions (Addendum)
 [facility] - A DEPT OF [facility]  Discharge Instructions: Thank you for choosing [facility] to provide your oncology and hematology care.   If you have a lab appointment with the Cancer Center, please go directly to the Cancer Center and check in at the registration area.   Wear comfortable clothing and clothing appropriate for easy access to any Portacath or PICC line.   We strive to give you quality time with your provider. You may need to reschedule your appointment if you arrive late (15 or more minutes).  Arriving late affects you and other patients whose appointments are after yours.  Also, if you miss three or more appointments without notifying the office, you may be dismissed from the clinic at the provider's discretion.      For prescription refill requests, have your pharmacy contact our office and allow 72 hours for refills to be completed.    Today you received the following chemotherapy and/or immunotherapy agents: Docetaxel      To help prevent nausea and vomiting after your treatment, we encourage you to take your nausea medication as directed.  BELOW ARE SYMPTOMS THAT SHOULD BE REPORTED IMMEDIATELY: *FEVER GREATER THAN 100.4 F (38 C) OR HIGHER *CHILLS OR SWEATING *NAUSEA AND VOMITING THAT IS NOT CONTROLLED WITH YOUR NAUSEA MEDICATION *UNUSUAL SHORTNESS OF BREATH *UNUSUAL BRUISING OR BLEEDING *URINARY PROBLEMS (pain or burning when urinating, or frequent urination) *BOWEL PROBLEMS (unusual diarrhea, constipation, pain near the anus) TENDERNESS IN MOUTH AND THROAT WITH OR WITHOUT PRESENCE OF ULCERS (sore throat, sores in mouth, or a toothache) UNUSUAL RASH, SWELLING OR PAIN  UNUSUAL VAGINAL DISCHARGE OR ITCHING   Items with * indicate a potential emergency and should be followed up as soon as possible or go to the Emergency Department if any problems should occur.  Please show the CHEMOTHERAPY ALERT CARD or IMMUNOTHERAPY  ALERT CARD at check-in to the Emergency Department and triage nurse.  Should you have questions after your visit or need to cancel or reschedule your appointment, please contact [facility] - A DEPT OF [facility]  Dept: [phone]  and follow the prompts.  Office hours are 8:00 a.m. to 4:30 p.m. Monday - Friday. Please note that voicemails left after 4:00 p.m. may not be returned until the following business day.  We are closed weekends and major holidays. You have access to a nurse at all times for urgent questions. Please call the main number to the clinic Dept: [phone] and follow the prompts.   For any non-urgent questions, you may also contact your provider using MyChart. We now offer e-Visits for anyone 58 and older to request care online for non-urgent symptoms. For details visit [email or web address].   Also download the MyChart app! Go to the app store, search "MyChart", open the app, select , and log in with your MyChart username and password.`

## 2024-04-13 ENCOUNTER — Ambulatory Visit: Payer: Self-pay | Admitting: *Deleted

## 2024-04-17 ENCOUNTER — Inpatient Hospital Stay

## 2024-04-17 ENCOUNTER — Other Ambulatory Visit: Payer: Self-pay | Admitting: Hematology and Oncology

## 2024-04-17 VITALS — BP 166/65 | HR 65 | Temp 98.2°F | Resp 20

## 2024-04-17 DIAGNOSIS — N951 Menopausal and female climacteric states: Secondary | ICD-10-CM

## 2024-04-17 DIAGNOSIS — Z5111 Encounter for antineoplastic chemotherapy: Secondary | ICD-10-CM | POA: Diagnosis not present

## 2024-04-17 DIAGNOSIS — C569 Malignant neoplasm of unspecified ovary: Secondary | ICD-10-CM

## 2024-04-17 MED ORDER — SODIUM CHLORIDE 0.9 % IV SOLN: INTRAVENOUS | Status: DC

## 2024-04-17 MED ORDER — SODIUM CHLORIDE 0.9 % IV SOLN: Freq: Once | INTRAVENOUS | Status: AC

## 2024-04-17 NOTE — Patient Instructions (Signed)

## 2024-04-28 ENCOUNTER — Encounter: Payer: Self-pay | Admitting: Hematology and Oncology

## 2024-05-03 ENCOUNTER — Telehealth: Payer: Self-pay

## 2024-05-03 ENCOUNTER — Inpatient Hospital Stay: Attending: Hematology and Oncology

## 2024-05-03 ENCOUNTER — Inpatient Hospital Stay

## 2024-05-03 ENCOUNTER — Inpatient Hospital Stay: Admitting: Hematology and Oncology

## 2024-05-03 VITALS — BP 147/61 | HR 59 | Resp 18 | Ht 65.0 in | Wt 139.4 lb

## 2024-05-03 VITALS — BP 170/80 | HR 62 | Resp 16

## 2024-05-03 DIAGNOSIS — Z79633 Long term (current) use of mitotic inhibitor: Secondary | ICD-10-CM | POA: Diagnosis not present

## 2024-05-03 DIAGNOSIS — Z7189 Other specified counseling: Secondary | ICD-10-CM

## 2024-05-03 DIAGNOSIS — E860 Dehydration: Secondary | ICD-10-CM | POA: Diagnosis present

## 2024-05-03 DIAGNOSIS — C786 Secondary malignant neoplasm of retroperitoneum and peritoneum: Secondary | ICD-10-CM | POA: Insufficient documentation

## 2024-05-03 DIAGNOSIS — D61810 Antineoplastic chemotherapy induced pancytopenia: Secondary | ICD-10-CM | POA: Insufficient documentation

## 2024-05-03 DIAGNOSIS — C7801 Secondary malignant neoplasm of right lung: Secondary | ICD-10-CM | POA: Diagnosis not present

## 2024-05-03 DIAGNOSIS — Z5111 Encounter for antineoplastic chemotherapy: Secondary | ICD-10-CM | POA: Insufficient documentation

## 2024-05-03 DIAGNOSIS — R634 Abnormal weight loss: Secondary | ICD-10-CM

## 2024-05-03 DIAGNOSIS — C5701 Malignant neoplasm of right fallopian tube: Secondary | ICD-10-CM

## 2024-05-03 DIAGNOSIS — D61818 Other pancytopenia: Secondary | ICD-10-CM

## 2024-05-03 DIAGNOSIS — C7802 Secondary malignant neoplasm of left lung: Secondary | ICD-10-CM | POA: Insufficient documentation

## 2024-05-03 LAB — CBC WITH DIFFERENTIAL (CANCER CENTER ONLY)
Abs Immature Granulocytes: 0.12 K/uL — ABNORMAL HIGH (ref 0.00–0.07)
Basophils Absolute: 0 K/uL (ref 0.0–0.1)
Basophils Relative: 1 %
Eosinophils Absolute: 0 K/uL (ref 0.0–0.5)
Eosinophils Relative: 0 %
HCT: 31.7 % — ABNORMAL LOW (ref 36.0–46.0)
Hemoglobin: 10.3 g/dL — ABNORMAL LOW (ref 12.0–15.0)
Immature Granulocytes: 3 %
Lymphocytes Relative: 17 %
Lymphs Abs: 0.7 K/uL (ref 0.7–4.0)
MCH: 31 pg (ref 26.0–34.0)
MCHC: 32.5 g/dL (ref 30.0–36.0)
MCV: 95.5 fL (ref 80.0–100.0)
Monocytes Absolute: 0.1 K/uL (ref 0.1–1.0)
Monocytes Relative: 2 %
Neutro Abs: 2.9 K/uL (ref 1.7–7.7)
Neutrophils Relative %: 77 %
Platelet Count: 203 K/uL (ref 150–400)
RBC: 3.32 MIL/uL — ABNORMAL LOW (ref 3.87–5.11)
RDW: 16.8 % — ABNORMAL HIGH (ref 11.5–15.5)
WBC Count: 3.8 K/uL — ABNORMAL LOW (ref 4.0–10.5)
nRBC: 0 % (ref 0.0–0.2)

## 2024-05-03 LAB — CMP (CANCER CENTER ONLY)
ALT: 7 U/L (ref 0–44)
AST: 24 U/L (ref 15–41)
Albumin: 3.8 g/dL (ref 3.5–5.0)
Alkaline Phosphatase: 78 U/L (ref 38–126)
Anion gap: 14 (ref 5–15)
BUN: 11 mg/dL (ref 8–23)
CO2: 22 mmol/L (ref 22–32)
Calcium: 9.1 mg/dL (ref 8.9–10.3)
Chloride: 101 mmol/L (ref 98–111)
Creatinine: 1.12 mg/dL — ABNORMAL HIGH (ref 0.44–1.00)
GFR, Estimated: 53 mL/min — ABNORMAL LOW
Glucose, Bld: 322 mg/dL — ABNORMAL HIGH (ref 70–99)
Potassium: 4.2 mmol/L (ref 3.5–5.1)
Sodium: 136 mmol/L (ref 135–145)
Total Bilirubin: 0.5 mg/dL (ref 0.0–1.2)
Total Protein: 6.4 g/dL — ABNORMAL LOW (ref 6.5–8.1)

## 2024-05-03 MED ORDER — DEXAMETHASONE SOD PHOSPHATE PF 10 MG/ML IJ SOLN
10.0000 mg | Freq: Once | INTRAMUSCULAR | Status: AC
  Administered 2024-05-03: 10 mg via INTRAVENOUS
  Filled 2024-05-03: qty 1

## 2024-05-03 MED ORDER — SODIUM CHLORIDE 0.9 % IV SOLN: INTRAVENOUS | Status: DC

## 2024-05-03 MED ORDER — SODIUM CHLORIDE 0.9 % IV SOLN
50.0000 mg/m2 | Freq: Once | INTRAVENOUS | Status: AC
  Administered 2024-05-03: 80 mg via INTRAVENOUS
  Filled 2024-05-03: qty 8

## 2024-05-03 MED ORDER — INSULIN ASPART 100 UNIT/ML IJ SOLN
10.0000 [IU] | Freq: Once | INTRAMUSCULAR | Status: AC
  Administered 2024-05-03: 10 [IU] via SUBCUTANEOUS
  Filled 2024-05-03: qty 1

## 2024-05-03 NOTE — Telephone Encounter (Signed)
 Patient IVF Scheduling Update  Ordering Provider: Dr. Lonn Status: Patient scheduled for IVF. Contact Attempts: Unable to reach patient via cell phone to inform about appointment dates and times. Follow-up: [facility] Nurse Ileana confirmed that the patient has been made aware of appointment dates, times, and location.

## 2024-05-03 NOTE — Patient Instructions (Signed)
 [facility] - A DEPT OF [facility]  Discharge Instructions: Thank you for choosing [facility] to provide your oncology and hematology care.   If you have a lab appointment with the Cancer Center, please go directly to the Cancer Center and check in at the registration area.   Wear comfortable clothing and clothing appropriate for easy access to any Portacath or PICC line.   We strive to give you quality time with your provider. You may need to reschedule your appointment if you arrive late (15 or more minutes).  Arriving late affects you and other patients whose appointments are after yours.  Also, if you miss three or more appointments without notifying the office, you may be dismissed from the clinic at the provider's discretion.      For prescription refill requests, have your pharmacy contact our office and allow 72 hours for refills to be completed.    Today you received the following chemotherapy and/or immunotherapy agents: Docetaxel      To help prevent nausea and vomiting after your treatment, we encourage you to take your nausea medication as directed.  BELOW ARE SYMPTOMS THAT SHOULD BE REPORTED IMMEDIATELY: *FEVER GREATER THAN 100.4 F (38 C) OR HIGHER *CHILLS OR SWEATING *NAUSEA AND VOMITING THAT IS NOT CONTROLLED WITH YOUR NAUSEA MEDICATION *UNUSUAL SHORTNESS OF BREATH *UNUSUAL BRUISING OR BLEEDING *URINARY PROBLEMS (pain or burning when urinating, or frequent urination) *BOWEL PROBLEMS (unusual diarrhea, constipation, pain near the anus) TENDERNESS IN MOUTH AND THROAT WITH OR WITHOUT PRESENCE OF ULCERS (sore throat, sores in mouth, or a toothache) UNUSUAL RASH, SWELLING OR PAIN  UNUSUAL VAGINAL DISCHARGE OR ITCHING   Items with * indicate a potential emergency and should be followed up as soon as possible or go to the Emergency Department if any problems should occur.  Please show the CHEMOTHERAPY ALERT CARD or IMMUNOTHERAPY  ALERT CARD at check-in to the Emergency Department and triage nurse.  Should you have questions after your visit or need to cancel or reschedule your appointment, please contact [facility] - A DEPT OF [facility]  Dept: [phone]  and follow the prompts.  Office hours are 8:00 a.m. to 4:30 p.m. Monday - Friday. Please note that voicemails left after 4:00 p.m. may not be returned until the following business day.  We are closed weekends and major holidays. You have access to a nurse at all times for urgent questions. Please call the main number to the clinic Dept: [phone] and follow the prompts.   For any non-urgent questions, you may also contact your provider using MyChart. We now offer e-Visits for anyone 58 and older to request care online for non-urgent symptoms. For details visit [email or web address].   Also download the MyChart app! Go to the app store, search "MyChart", open the app, select , and log in with your MyChart username and password.`

## 2024-05-03 NOTE — Assessment & Plan Note (Addendum)
 Due to recent changes in appetite We discussed importance of frequent small meals and IV fluid support I will revise the dose of her treatment based on today's weight

## 2024-05-03 NOTE — Progress Notes (Signed)
 [facility] OFFICE PROGRESS NOTE  Patient Care Team: Loreli Kins, MD as PCP - General (Family Medicine) Lonn Hicks, MD as Consulting Physician (Hematology and Oncology)  Assessment & Plan Adenocarcinoma of right fallopian tube [facility]) The patient was diagnosed with fallopian tube cancer in 2018 status post surgery and completion of adjuvant chemotherapy in 2019 She developed recurrent disease in 2020 after presentation with high-grade small bowel obstruction treated with carboplatin , Doxil  and bevacizumab  followed by bevacizumab  maintenance She had gallbladder surgery in November 2023 and adjacent lymph node came back positive for recurrent high-grade serous carcinoma, molecular testing was done on her tissue from 2023 which revealed BRCA mutation in her tumor sample but the patient had progressed on niraparib  in the past so overall this is not helpful.  Additionally, her tumor was HRD negative, RAD50 1D deleted, p53 mutated, BRAF benign, MSI stable, NTRK fusion not detected, RET fusion not detected, low tumor mutation burden, ER +40%, HER2 not amplified, folate receptor 1 alpha 60%, negative Tumor marker CA125 is not helpful  I reviewed CT imaging that was done recently in December 2025 which show recurrent ascites and metastatic disease in her lungs and peritoneum with ileus/carcinomatosis She is symptomatic requiring IV fluids and antiemetics Overall, the patient has progressed on carboplatin , gemcitabine , Doxil , bevacizumab  and niraparib  She elected to proceed with further chemotherapy with docetaxel  Overall, she continues to battle with dehydration on day 3-5 after each cycle of treatment She denies worsening neuropathy; however, She has lost a lot of weight We will proceed with treatment today with additional support I will revise the dose of her chemotherapy based on today's weight We will schedule IV fluid support for 3 days after each cycle of treatment I plan to  repeat imaging study after 3 cycles of chemotherapy Pancytopenia, acquired (HCC) She has intermittent pancytopenia due to treatment She is not symptomatic We will proceed with treatment without delay Weight loss Due to recent changes in appetite We discussed importance of frequent small meals and IV fluid support I will revise the dose of her treatment based on today's weight   No orders of the defined types were placed in this encounter.    Hicks Lonn, MD  INTERVAL HISTORY: she returns for treatment follow-up Complications related to previous cycle of chemotherapy included pancytopenia,, dehydration,, and weight loss,  PHYSICAL EXAMINATION: ECOG PERFORMANCE STATUS: 1 - Symptomatic but completely ambulatory  Lab Results  Component Value Date   CAN125 28.3 03/12/2024   CAN125 24.0 02/16/2024   CAN125 26.6 01/29/2024      Latest Ref Rng & Units 05/03/2024   11:42 AM 04/12/2024    1:37 PM 03/25/2024    9:27 AM  CBC  WBC 4.0 - 10.5 K/uL 3.8  2.3  4.4   Hemoglobin 12.0 - 15.0 g/dL 89.6  9.6  89.0   Hematocrit 36.0 - 46.0 % 31.7  28.8  33.4   Platelets 150 - 400 K/uL 203  181  210       Chemistry      Component Value Date/Time   NA 136 05/03/2024 1142   NA 139 04/14/2017 0742   K 4.2 05/03/2024 1142   K 4.0 04/14/2017 0742   CL 101 05/03/2024 1142   CO2 22 05/03/2024 1142   CO2 21 (L) 04/14/2017 0742   BUN 11 05/03/2024 1142   BUN 13.1 04/14/2017 0742   CREATININE 1.12 (H) 05/03/2024 1142   CREATININE 0.9 04/14/2017 0742      Component Value  Date/Time   CALCIUM 9.1 05/03/2024 1142   CALCIUM 9.2 04/14/2017 0742   ALKPHOS 78 05/03/2024 1142   ALKPHOS 70 04/14/2017 0742   AST 24 05/03/2024 1142   AST 17 04/14/2017 0742   ALT 7 05/03/2024 1142   ALT 14 04/14/2017 0742   BILITOT 0.5 05/03/2024 1142   BILITOT 0.37 04/14/2017 0742       Vitals:   05/03/24 1212  BP: (!) 147/61  Pulse: (!) 59  Resp: 18  SpO2: 97%   Filed Weights   05/03/24 1212  Weight:  139 lb 6.4 oz (63.2 kg)   Other relevant data reviewed during this visit included CBC and CMP

## 2024-05-03 NOTE — Assessment & Plan Note (Addendum)
 The patient was diagnosed with fallopian tube cancer in 2018 status post surgery and completion of adjuvant chemotherapy in 2019 She developed recurrent disease in 2020 after presentation with high-grade small bowel obstruction treated with carboplatin , Doxil  and bevacizumab  followed by bevacizumab  maintenance She had gallbladder surgery in November 2023 and adjacent lymph node came back positive for recurrent high-grade serous carcinoma, molecular testing was done on her tissue from 2023 which revealed BRCA mutation in her tumor sample but the patient had progressed on niraparib  in the past so overall this is not helpful.  Additionally, her tumor was HRD negative, RAD50 1D deleted, p53 mutated, BRAF benign, MSI stable, NTRK fusion not detected, RET fusion not detected, low tumor mutation burden, ER +40%, HER2 not amplified, folate receptor 1 alpha 60%, negative Tumor marker CA125 is not helpful  I reviewed CT imaging that was done recently in December 2025 which show recurrent ascites and metastatic disease in her lungs and peritoneum with ileus/carcinomatosis She is symptomatic requiring IV fluids and antiemetics Overall, the patient has progressed on carboplatin , gemcitabine , Doxil , bevacizumab  and niraparib  She elected to proceed with further chemotherapy with docetaxel  Overall, she continues to battle with dehydration on day 3-5 after each cycle of treatment She denies worsening neuropathy; however, She has lost a lot of weight We will proceed with treatment today with additional support I will revise the dose of her chemotherapy based on today's weight We will schedule IV fluid support for 3 days after each cycle of treatment I plan to repeat imaging study after 3 cycles of chemotherapy

## 2024-05-03 NOTE — Assessment & Plan Note (Addendum)
 She has intermittent pancytopenia due to treatment She is not symptomatic We will proceed with treatment without delay

## 2024-05-04 ENCOUNTER — Telehealth: Payer: Self-pay

## 2024-05-04 NOTE — Telephone Encounter (Signed)
 Called to remind patient of her IVF appointment location and time for tomorrow 05/04/2024. Patient verbalized confirmation.

## 2024-05-05 ENCOUNTER — Inpatient Hospital Stay

## 2024-05-05 VITALS — BP 170/68 | HR 56 | Temp 98.1°F | Resp 15 | Wt 135.7 lb

## 2024-05-05 DIAGNOSIS — Z7189 Other specified counseling: Secondary | ICD-10-CM

## 2024-05-05 DIAGNOSIS — C5701 Malignant neoplasm of right fallopian tube: Secondary | ICD-10-CM

## 2024-05-05 DIAGNOSIS — Z5111 Encounter for antineoplastic chemotherapy: Secondary | ICD-10-CM | POA: Diagnosis not present

## 2024-05-05 MED ORDER — SODIUM CHLORIDE 0.9 % IV SOLN: Freq: Once | INTRAVENOUS | Status: AC

## 2024-05-05 NOTE — Patient Instructions (Signed)

## 2024-05-06 ENCOUNTER — Inpatient Hospital Stay

## 2024-05-06 VITALS — BP 167/77 | HR 55 | Temp 98.1°F | Resp 18

## 2024-05-06 DIAGNOSIS — C5701 Malignant neoplasm of right fallopian tube: Secondary | ICD-10-CM

## 2024-05-06 DIAGNOSIS — Z5111 Encounter for antineoplastic chemotherapy: Secondary | ICD-10-CM | POA: Diagnosis not present

## 2024-05-06 DIAGNOSIS — Z7189 Other specified counseling: Secondary | ICD-10-CM

## 2024-05-06 MED ORDER — SODIUM CHLORIDE 0.9 % IV SOLN: Freq: Once | INTRAVENOUS | Status: AC

## 2024-05-06 NOTE — Patient Instructions (Signed)
 Dehydration, Adult Dehydration is a condition in which there is not enough water or other fluids in the body. This happens when a person loses more fluids than they take in. Important organs cannot work right without the right amount of fluids. Any loss of fluids from the body can cause dehydration. Dehydration can be mild, worse, or very bad. It should be treated right away to keep it from getting very bad. What are the causes? Conditions that cause loss of water in the body. They include: Watery poop (diarrhea). Vomiting. Sweating a lot. Fever. Infection. Peeing (urinating) a lot. Not drinking enough fluids. Certain medicines, such as medicines that take extra fluid out of the body (diuretics). Lack of safe drinking water. Not being able to get enough water and food. What increases the risk? Having a long-term (chronic) illness that has not been treated the right way, such as: Diabetes. Heart disease. Kidney disease. Being 25 years of age or older. Having a disability. Living in a place that is high above the ground or sea (high in altitude). The thinner, drier air causes more fluid loss. Doing exercises that put stress on your body for a long time. Being active when in hot places. What are the signs or symptoms? Symptoms of dehydration depend on how bad it is. Mild or worse dehydration Thirst. Dry lips or dry mouth. Feeling dizzy or light-headed. Muscle cramps. Passing little pee or dark pee. Pee may be the color of tea. Headache. Very bad dehydration Changes in skin. Skin may: Be cold to the touch (clammy). Be blotchy or pale. Not go back to normal right after you pinch it and let it go. Little or no tears, pee, or sweat. Fast breathing. Low blood pressure. Weak pulse. Pulse that is more than 100 beats a minute when you are sitting still. Other changes, such as: Feeling very thirsty. Eyes that look hollow (sunken). Cold hands and feet. Being confused. Being very  tired (lethargic) or having trouble waking from sleep. Losing weight. Loss of consciousness. How is this treated? Treatment for this condition depends on how bad your dehydration is. Treatment should start right away. Do not wait until your condition gets very bad. Very bad dehydration is an emergency. You will need to go to a hospital. Mild or worse dehydration can be treated at home. You may be asked to: Drink more fluids. Drink an oral rehydration solution (ORS). This drink gives you the right amount of fluids, salts, and minerals (electrolytes). Very bad dehydration can be treated: With fluids through an IV tube. By correcting low levels of electrolytes in the body. By treating the problem that caused your dehydration. Follow these instructions at home: Oral rehydration solution If told by your doctor, drink an ORS: Make an ORS. Use instructions on the package. Start by drinking small amounts, about  cup (120 mL) every 5-10 minutes. Slowly drink more until you have had the amount that your doctor said to have.  Eating and drinking  Drink enough clear fluid to keep your pee pale yellow. If you were told to drink an ORS, finish the ORS first. Then, start slowly drinking other clear fluids. Drink fluids such as: Water. Do not drink only water. Doing that can make the salt (sodium) level in your body get too low. Water from ice chips you suck on. Fruit juice that you have added water to (diluted). Low-calorie sports drinks. Eat foods that have the right amounts of salts and minerals, such as bananas, oranges, potatoes,  tomatoes, or spinach. Do not drink alcohol. Avoid drinks that have caffeine or sugar. These include:: High-calorie sports drinks. Fruit juice that you did not add water to. Soda. Coffee or energy drinks. Avoid foods that are greasy or have a lot of fat or sugar. General instructions Take over-the-counter and prescription medicines only as told by your doctor. Do  not take sodium tablets. Doing that can make the salt level in your body get too high. Return to your normal activities as told by your doctor. Ask your doctor what activities are safe for you. Keep all follow-up visits. Your doctor may check and change your treatment. Contact a doctor if: You have pain in your belly (abdomen) and the pain: Gets worse. Stays in one place. You have a rash. You have a stiff neck. You get angry or annoyed more easily than normal. You are more tired or have a harder time waking than normal. You feel weak or dizzy. You feel very thirsty. Get help right away if: You have any symptoms of very bad dehydration. You vomit every time you eat or drink. Your vomiting gets worse, does not go away, or you vomit blood or green stuff. You are getting treatment, but symptoms are getting worse. You have a fever. You have a very bad headache. You have: Diarrhea that gets worse or does not go away. Blood in your poop (stool). This may cause poop to look black and tarry. No pee in 6-8 hours. Only a small amount of pee in 6-8 hours, and the pee is very dark. You have trouble breathing. These symptoms may be an emergency. Get help right away. Call 911. Do not wait to see if the symptoms will go away. Do not drive yourself to the hospital. This information is not intended to replace advice given to you by your health care provider. Make sure you discuss any questions you have with your health care provider. Document Revised: 11/05/2021 Document Reviewed: 11/05/2021 Elsevier Patient Education  2024 [employer].

## 2024-05-07 ENCOUNTER — Inpatient Hospital Stay

## 2024-05-07 VITALS — BP 160/70 | HR 57 | Temp 98.2°F | Resp 18

## 2024-05-07 DIAGNOSIS — Z5111 Encounter for antineoplastic chemotherapy: Secondary | ICD-10-CM | POA: Diagnosis not present

## 2024-05-07 DIAGNOSIS — C5701 Malignant neoplasm of right fallopian tube: Secondary | ICD-10-CM

## 2024-05-07 DIAGNOSIS — Z7189 Other specified counseling: Secondary | ICD-10-CM

## 2024-05-07 MED ORDER — SODIUM CHLORIDE 0.9 % IV SOLN: Freq: Once | INTRAVENOUS | Status: AC

## 2024-05-07 NOTE — Patient Instructions (Signed)
 Dehydration, Adult Dehydration is a condition in which there is not enough water or other fluids in the body. This happens when a person loses more fluids than they take in. Important organs cannot work right without the right amount of fluids. Any loss of fluids from the body can cause dehydration. Dehydration can be mild, worse, or very bad. It should be treated right away to keep it from getting very bad. What are the causes? Conditions that cause loss of water in the body. They include: Watery poop (diarrhea). Vomiting. Sweating a lot. Fever. Infection. Peeing (urinating) a lot. Not drinking enough fluids. Certain medicines, such as medicines that take extra fluid out of the body (diuretics). Lack of safe drinking water. Not being able to get enough water and food. What increases the risk? Having a long-term (chronic) illness that has not been treated the right way, such as: Diabetes. Heart disease. Kidney disease. Being 25 years of age or older. Having a disability. Living in a place that is high above the ground or sea (high in altitude). The thinner, drier air causes more fluid loss. Doing exercises that put stress on your body for a long time. Being active when in hot places. What are the signs or symptoms? Symptoms of dehydration depend on how bad it is. Mild or worse dehydration Thirst. Dry lips or dry mouth. Feeling dizzy or light-headed. Muscle cramps. Passing little pee or dark pee. Pee may be the color of tea. Headache. Very bad dehydration Changes in skin. Skin may: Be cold to the touch (clammy). Be blotchy or pale. Not go back to normal right after you pinch it and let it go. Little or no tears, pee, or sweat. Fast breathing. Low blood pressure. Weak pulse. Pulse that is more than 100 beats a minute when you are sitting still. Other changes, such as: Feeling very thirsty. Eyes that look hollow (sunken). Cold hands and feet. Being confused. Being very  tired (lethargic) or having trouble waking from sleep. Losing weight. Loss of consciousness. How is this treated? Treatment for this condition depends on how bad your dehydration is. Treatment should start right away. Do not wait until your condition gets very bad. Very bad dehydration is an emergency. You will need to go to a hospital. Mild or worse dehydration can be treated at home. You may be asked to: Drink more fluids. Drink an oral rehydration solution (ORS). This drink gives you the right amount of fluids, salts, and minerals (electrolytes). Very bad dehydration can be treated: With fluids through an IV tube. By correcting low levels of electrolytes in the body. By treating the problem that caused your dehydration. Follow these instructions at home: Oral rehydration solution If told by your doctor, drink an ORS: Make an ORS. Use instructions on the package. Start by drinking small amounts, about  cup (120 mL) every 5-10 minutes. Slowly drink more until you have had the amount that your doctor said to have.  Eating and drinking  Drink enough clear fluid to keep your pee pale yellow. If you were told to drink an ORS, finish the ORS first. Then, start slowly drinking other clear fluids. Drink fluids such as: Water. Do not drink only water. Doing that can make the salt (sodium) level in your body get too low. Water from ice chips you suck on. Fruit juice that you have added water to (diluted). Low-calorie sports drinks. Eat foods that have the right amounts of salts and minerals, such as bananas, oranges, potatoes,  tomatoes, or spinach. Do not drink alcohol. Avoid drinks that have caffeine or sugar. These include:: High-calorie sports drinks. Fruit juice that you did not add water to. Soda. Coffee or energy drinks. Avoid foods that are greasy or have a lot of fat or sugar. General instructions Take over-the-counter and prescription medicines only as told by your doctor. Do  not take sodium tablets. Doing that can make the salt level in your body get too high. Return to your normal activities as told by your doctor. Ask your doctor what activities are safe for you. Keep all follow-up visits. Your doctor may check and change your treatment. Contact a doctor if: You have pain in your belly (abdomen) and the pain: Gets worse. Stays in one place. You have a rash. You have a stiff neck. You get angry or annoyed more easily than normal. You are more tired or have a harder time waking than normal. You feel weak or dizzy. You feel very thirsty. Get help right away if: You have any symptoms of very bad dehydration. You vomit every time you eat or drink. Your vomiting gets worse, does not go away, or you vomit blood or green stuff. You are getting treatment, but symptoms are getting worse. You have a fever. You have a very bad headache. You have: Diarrhea that gets worse or does not go away. Blood in your poop (stool). This may cause poop to look black and tarry. No pee in 6-8 hours. Only a small amount of pee in 6-8 hours, and the pee is very dark. You have trouble breathing. These symptoms may be an emergency. Get help right away. Call 911. Do not wait to see if the symptoms will go away. Do not drive yourself to the hospital. This information is not intended to replace advice given to you by your health care provider. Make sure you discuss any questions you have with your health care provider. Document Revised: 11/05/2021 Document Reviewed: 11/05/2021 Elsevier Patient Education  2024 [employer].

## 2024-05-11 ENCOUNTER — Encounter: Payer: Self-pay | Admitting: Hematology and Oncology

## 2024-05-11 ENCOUNTER — Other Ambulatory Visit: Payer: Self-pay

## 2024-05-11 ENCOUNTER — Other Ambulatory Visit (HOSPITAL_COMMUNITY): Payer: Self-pay

## 2024-05-11 MED ORDER — NYSTATIN 100000 UNIT/ML MT SUSP
5.0000 mL | Freq: Four times a day (QID) | OROMUCOSAL | 0 refills | Status: DC
  Filled 2024-05-11: qty 140, 7d supply, fill #0

## 2024-05-15 ENCOUNTER — Other Ambulatory Visit: Payer: Self-pay | Admitting: Hematology and Oncology

## 2024-05-18 ENCOUNTER — Encounter: Payer: Self-pay | Admitting: Hematology and Oncology

## 2024-05-24 ENCOUNTER — Inpatient Hospital Stay

## 2024-05-24 ENCOUNTER — Inpatient Hospital Stay: Admitting: Hematology and Oncology

## 2024-05-25 ENCOUNTER — Other Ambulatory Visit: Payer: Self-pay

## 2024-05-25 ENCOUNTER — Inpatient Hospital Stay

## 2024-05-25 ENCOUNTER — Encounter: Payer: Self-pay | Admitting: Hematology and Oncology

## 2024-05-25 ENCOUNTER — Telehealth: Payer: Self-pay | Admitting: Hematology and Oncology

## 2024-05-25 ENCOUNTER — Inpatient Hospital Stay: Attending: Hematology and Oncology | Admitting: Hematology and Oncology

## 2024-05-25 ENCOUNTER — Other Ambulatory Visit (HOSPITAL_COMMUNITY): Payer: Self-pay

## 2024-05-25 VITALS — BP 141/57 | HR 63 | Temp 97.6°F | Resp 17 | Wt 136.6 lb

## 2024-05-25 DIAGNOSIS — C5701 Malignant neoplasm of right fallopian tube: Secondary | ICD-10-CM

## 2024-05-25 DIAGNOSIS — R634 Abnormal weight loss: Secondary | ICD-10-CM | POA: Diagnosis not present

## 2024-05-25 DIAGNOSIS — T451X5A Adverse effect of antineoplastic and immunosuppressive drugs, initial encounter: Secondary | ICD-10-CM | POA: Diagnosis not present

## 2024-05-25 DIAGNOSIS — Z7189 Other specified counseling: Secondary | ICD-10-CM

## 2024-05-25 DIAGNOSIS — D6481 Anemia due to antineoplastic chemotherapy: Secondary | ICD-10-CM

## 2024-05-25 DIAGNOSIS — K1231 Oral mucositis (ulcerative) due to antineoplastic therapy: Secondary | ICD-10-CM | POA: Diagnosis not present

## 2024-05-25 LAB — CMP (CANCER CENTER ONLY)
ALT: 6 U/L (ref 0–44)
AST: 20 U/L (ref 15–41)
Albumin: 3.6 g/dL (ref 3.5–5.0)
Alkaline Phosphatase: 70 U/L (ref 38–126)
Anion gap: 10 (ref 5–15)
BUN: 13 mg/dL (ref 8–23)
CO2: 25 mmol/L (ref 22–32)
Calcium: 9.1 mg/dL (ref 8.9–10.3)
Chloride: 103 mmol/L (ref 98–111)
Creatinine: 1.12 mg/dL — ABNORMAL HIGH (ref 0.44–1.00)
GFR, Estimated: 53 mL/min — ABNORMAL LOW
Glucose, Bld: 96 mg/dL (ref 70–99)
Potassium: 4.2 mmol/L (ref 3.5–5.1)
Sodium: 139 mmol/L (ref 135–145)
Total Bilirubin: 0.4 mg/dL (ref 0.0–1.2)
Total Protein: 6.1 g/dL — ABNORMAL LOW (ref 6.5–8.1)

## 2024-05-25 LAB — CBC WITH DIFFERENTIAL (CANCER CENTER ONLY)
Abs Immature Granulocytes: 0.05 10*3/uL (ref 0.00–0.07)
Basophils Absolute: 0.1 10*3/uL (ref 0.0–0.1)
Basophils Relative: 1 %
Eosinophils Absolute: 0 10*3/uL (ref 0.0–0.5)
Eosinophils Relative: 0 %
HCT: 32.6 % — ABNORMAL LOW (ref 36.0–46.0)
Hemoglobin: 10.7 g/dL — ABNORMAL LOW (ref 12.0–15.0)
Immature Granulocytes: 1 %
Lymphocytes Relative: 19 %
Lymphs Abs: 1.4 10*3/uL (ref 0.7–4.0)
MCH: 30.5 pg (ref 26.0–34.0)
MCHC: 32.8 g/dL (ref 30.0–36.0)
MCV: 92.9 fL (ref 80.0–100.0)
Monocytes Absolute: 0.6 10*3/uL (ref 0.1–1.0)
Monocytes Relative: 9 %
Neutro Abs: 5.3 10*3/uL (ref 1.7–7.7)
Neutrophils Relative %: 70 %
Platelet Count: 207 10*3/uL (ref 150–400)
RBC: 3.51 MIL/uL — ABNORMAL LOW (ref 3.87–5.11)
RDW: 16.4 % — ABNORMAL HIGH (ref 11.5–15.5)
WBC Count: 7.4 10*3/uL (ref 4.0–10.5)
nRBC: 0 % (ref 0.0–0.2)

## 2024-05-25 MED ORDER — NYSTATIN 100000 UNIT/ML MT SUSP
5.0000 mL | Freq: Four times a day (QID) | OROMUCOSAL | 0 refills | Status: AC
  Filled 2024-05-25: qty 140, 7d supply, fill #0

## 2024-05-25 NOTE — Assessment & Plan Note (Addendum)
 The patient was diagnosed with fallopian tube cancer in 2018 status post surgery and completion of adjuvant chemotherapy in 2019 She developed recurrent disease in 2020 after presentation with high-grade small bowel obstruction treated with carboplatin , Doxil  and bevacizumab  followed by bevacizumab  maintenance She had gallbladder surgery in November 2023 and adjacent lymph node came back positive for recurrent high-grade serous carcinoma, molecular testing was done on her tissue from 2023 which revealed BRCA mutation in her tumor sample but the patient had progressed on niraparib  in the past so overall this is not helpful.  Additionally, her tumor was HRD negative, RAD50 1D deleted, p53 mutated, BRAF benign, MSI stable, NTRK fusion not detected, RET fusion not detected, low tumor mutation burden, ER +40%, HER2 not amplified, folate receptor 1 alpha 60%, negative Tumor marker CA125 is not helpful  I reviewed CT imaging that was done recently in December 2025 which show recurrent ascites and metastatic disease in her lungs and peritoneum with ileus/carcinomatosis Overall, the patient has progressed on carboplatin , gemcitabine , Doxil , bevacizumab  and niraparib  She elected to proceed with further chemotherapy with docetaxel  Overall, she continues to battle with dehydration on day 2-4 after each cycle of treatment She denies worsening neuropathy; however, She has lost some weight and have recent mucositis We will proceed with treatment t tomorrow with additional support I will revise the dose of her chemotherapy We will schedule IV fluid support for 3 days after each cycle of treatment I plan to repeat imaging study after tomorrow's treatment

## 2024-05-25 NOTE — Assessment & Plan Note (Addendum)
 She has intermittent debilitating mucositis and dehydration We discussed importance of frequent small meals and I strongly advised the patient to keep her weight up if possible We will schedule IV fluids support

## 2024-05-25 NOTE — Telephone Encounter (Signed)
 Left a detailed message about appt dates and times. I will try back again later. But until then pt has been contacted.

## 2024-05-25 NOTE — Assessment & Plan Note (Addendum)
 This is likely due to recent treatment. The patient denies recent history of bleeding such as epistaxis, hematuria or hematochezia. She is asymptomatic from the anemia. I will observe for now.  She does not require transfusion now. I will continue the chemotherapy at current dose without dosage adjustment.  If the anemia gets progressive worse in the future, I might have to delay her treatment or adjust the chemotherapy dose.

## 2024-05-26 ENCOUNTER — Other Ambulatory Visit: Payer: Self-pay | Admitting: Hematology and Oncology

## 2024-05-26 ENCOUNTER — Other Ambulatory Visit (HOSPITAL_COMMUNITY): Payer: Self-pay

## 2024-05-26 ENCOUNTER — Inpatient Hospital Stay

## 2024-05-26 VITALS — BP 134/62 | HR 64 | Temp 97.7°F | Resp 18

## 2024-05-26 DIAGNOSIS — Z7189 Other specified counseling: Secondary | ICD-10-CM

## 2024-05-26 DIAGNOSIS — C5701 Malignant neoplasm of right fallopian tube: Secondary | ICD-10-CM

## 2024-05-26 MED ORDER — DEXAMETHASONE SOD PHOSPHATE PF 10 MG/ML IJ SOLN
10.0000 mg | Freq: Once | INTRAMUSCULAR | Status: AC
  Administered 2024-05-26: 10 mg via INTRAVENOUS
  Filled 2024-05-26: qty 1

## 2024-05-26 MED ORDER — SODIUM CHLORIDE 0.9 % IV SOLN: INTRAVENOUS | Status: DC

## 2024-05-26 MED ORDER — SODIUM CHLORIDE 0.9% FLUSH: 10.0000 mL | INTRAVENOUS | Status: DC | PRN

## 2024-05-26 MED ORDER — SODIUM CHLORIDE 0.9 % IV SOLN
40.0000 mg/m2 | Freq: Once | INTRAVENOUS | Status: AC
  Administered 2024-05-26: 68 mg via INTRAVENOUS
  Filled 2024-05-26: qty 6.8

## 2024-05-26 NOTE — Patient Instructions (Signed)
 [facility] - A DEPT OF [facility]  Discharge Instructions: Thank you for choosing [facility] to provide your oncology and hematology care.   If you have a lab appointment with the Cancer Center, please go directly to the Cancer Center and check in at the registration area.   Wear comfortable clothing and clothing appropriate for easy access to any Portacath or PICC line.   We strive to give you quality time with your provider. You may need to reschedule your appointment if you arrive late (15 or more minutes).  Arriving late affects you and other patients whose appointments are after yours.  Also, if you miss three or more appointments without notifying the office, you may be dismissed from the clinic at the provider's discretion.      For prescription refill requests, have your pharmacy contact our office and allow 72 hours for refills to be completed.    Today you received the following chemotherapy and/or immunotherapy agents taxotere .      To help prevent nausea and vomiting after your treatment, we encourage you to take your nausea medication as directed.  BELOW ARE SYMPTOMS THAT SHOULD BE REPORTED IMMEDIATELY: *FEVER GREATER THAN 100.4 F (38 C) OR HIGHER *CHILLS OR SWEATING *NAUSEA AND VOMITING THAT IS NOT CONTROLLED WITH YOUR NAUSEA MEDICATION *UNUSUAL SHORTNESS OF BREATH *UNUSUAL BRUISING OR BLEEDING *URINARY PROBLEMS (pain or burning when urinating, or frequent urination) *BOWEL PROBLEMS (unusual diarrhea, constipation, pain near the anus) TENDERNESS IN MOUTH AND THROAT WITH OR WITHOUT PRESENCE OF ULCERS (sore throat, sores in mouth, or a toothache) UNUSUAL RASH, SWELLING OR PAIN  UNUSUAL VAGINAL DISCHARGE OR ITCHING   Items with * indicate a potential emergency and should be followed up as soon as possible or go to the Emergency Department if any problems should occur.  Please show the CHEMOTHERAPY ALERT CARD or IMMUNOTHERAPY  ALERT CARD at check-in to the Emergency Department and triage nurse.  Should you have questions after your visit or need to cancel or reschedule your appointment, please contact [facility] - A DEPT [facility]  Dept: [phone]  and follow the prompts.  Office hours are 8:00 a.m. to 4:30 p.m. Monday - Friday. Please note that voicemails left after 4:00 p.m. may not be returned until the following business day.  We are closed weekends and major holidays. You have access to a nurse at all times for urgent questions. Please call the main number to the clinic Dept: [phone] and follow the prompts.   For any non-urgent questions, you may also contact your provider using MyChart. We now offer e-Visits for anyone 57 and older to request care online for non-urgent symptoms. For details visit [email or web address].   Also download the MyChart app! Go to the app store, search "MyChart", open the app, select [place], and log in with your MyChart username and password.

## 2024-05-27 ENCOUNTER — Encounter: Payer: Self-pay | Admitting: Hematology and Oncology

## 2024-05-27 ENCOUNTER — Inpatient Hospital Stay

## 2024-05-27 VITALS — BP 149/60 | HR 60 | Temp 97.7°F | Resp 16

## 2024-05-27 DIAGNOSIS — Z7189 Other specified counseling: Secondary | ICD-10-CM

## 2024-05-27 DIAGNOSIS — C5701 Malignant neoplasm of right fallopian tube: Secondary | ICD-10-CM

## 2024-05-27 MED ORDER — SODIUM CHLORIDE 0.9 % IV SOLN: Freq: Once | INTRAVENOUS | Status: AC

## 2024-05-28 ENCOUNTER — Inpatient Hospital Stay

## 2024-05-28 VITALS — BP 147/68 | HR 54 | Resp 16

## 2024-05-28 DIAGNOSIS — Z7189 Other specified counseling: Secondary | ICD-10-CM

## 2024-05-28 DIAGNOSIS — C5701 Malignant neoplasm of right fallopian tube: Secondary | ICD-10-CM

## 2024-05-28 MED ORDER — SODIUM CHLORIDE 0.9 % IV SOLN: Freq: Once | INTRAVENOUS | Status: AC

## 2024-05-28 NOTE — Patient Instructions (Signed)
Rehydration, Adult  Rehydration is the replacement of fluids, salts, and minerals in the body (electrolytes) that are lost during dehydration. Dehydration is when there is not enough water or other fluids in the body. This happens when you lose more fluids than you take in. People who are age 70 or older have a higher risk of dehydration than younger adults. This is because in older age, the body: Is less able to maintain the right amount of water. Does not respond to temperature changes as well. Does not get a sense of thirst as easily or quickly. Other causes include: Not drinking enough fluids. This can occur when you are ill, when you forget to drink, or when you are doing activities that require a lot of energy, especially in hot weather. Conditions that cause loss of water or other fluids. These include diarrhea, vomiting, sweating, or urinating a lot. Other illnesses, such as fever or infection. Certain medicines, such as those that remove excess fluid from the body (diuretics). Symptoms of mild or moderate dehydration may include thirst, dry lips and mouth, and dizziness. Symptoms of severe dehydration may include increased heart rate, confusion, fainting, and not urinating. In severe cases, you may need to get fluids through an IV at the hospital. For mild or moderate cases, you can usually rehydrate at home by drinking certain fluids as told by your health care provider. What are the risks? Rehydration is usually safe. Taking in too much fluid (overhydration) can be a problem but is rare. Overhydration can cause an imbalance of electrolytes in the body, kidney failure, fluid in the lungs, or a decrease in salt (sodium) levels in the body. Supplies needed: You will need an oral rehydration solution (ORS) if your health care provider tells you to use one. This is a drink to treat dehydration. It can be found in pharmacies and retail stores. How to rehydrate Fluids Follow instructions from  your health care provider about what to drink. The kind of fluid and the amount you should drink depend on your condition. In general, you should choose drinks that you prefer. If told by your health care provider, drink an ORS. Make an ORS by following instructions on the package. Start by drinking small amounts, about  cup (120 mL) every 5-10 minutes. Slowly increase how much you drink until you have taken in the amount recommended by your health care provider. Drink enough clear fluids to keep your urine pale yellow. If you were told to drink an ORS, finish it first, then start slowly drinking other clear fluids. Drink fluids such as: Water. This includes sparkling and flavored water. Drinking only water can lead to having too little sodium in your body (hyponatremia). Follow the advice of your health care provider. Water from ice chips you suck on. Fruit juice with water added to it(diluted). Sports drinks. Hot or cold herbal teas. Broth-based soups. Coffee. Milk or milk products. Food Follow instructions from your health care provider about what to eat while you rehydrate. Your health care provider may recommend that you slowly begin eating regular foods in small amounts. Eat foods that contain a healthy balance of electrolytes, such as bananas, oranges, potatoes, tomatoes, and spinach. Avoid foods that are greasy or contain a lot of sugar. In some cases, you may get nutrition through a feeding tube that is passed through your nose and into your stomach (nasogastric tube, or NG tube). This may be done if you have uncontrolled vomiting or diarrhea. Drinks to avoid  Certain drinks may make dehydration worse. While you rehydrate, avoid drinking alcohol. How to tell if you are recovering from dehydration You may be getting better if: You are urinating more often than before you started rehydrating. Your urine is pale yellow. Your energy level improves. You vomit less often. You have  diarrhea less often. Your appetite improves or returns to normal. You feel less dizzy or light-headed. Your skin tone and color start to look more normal. Follow these instructions at home: Take over-the-counter and prescription medicines only as told by your health care provider. Do not take sodium tablets. Doing this can lead to having too much sodium in your body (hypernatremia). Contact a health care provider if: You continue to have symptoms of mild or moderate dehydration, such as: Thirst. Dry lips. Slightly dry mouth. Dizziness. Dark urine or less urine than usual. Muscle cramps. You continue to vomit or have diarrhea. Get help right away if: You have symptoms of dehydration that get worse. You have a fever. You have a severe headache. You have been vomiting and have problems, such as: Your vomiting gets worse. Your vomit includes blood or green matter (bile). You cannot eat or drink without vomiting. You have problems with urination or bowel movements, such as: Diarrhea that gets worse. Blood in your stool (feces). This may cause stool to look black and tarry. Not urinating, or urinating only a small amount of very dark urine, within 6-8 hours. You have trouble breathing. You have symptoms that get worse with treatment. These symptoms may be an emergency. Get help right away. Call 911. Do not wait to see if the symptoms will go away. Do not drive yourself to the hospital. This information is not intended to replace advice given to you by your health care provider. Make sure you discuss any questions you have with your health care provider. Document Revised: 08/22/2021 Document Reviewed: 08/20/2021 [employer] Patient Education  2024 [employer].

## 2024-05-29 ENCOUNTER — Inpatient Hospital Stay

## 2024-06-07 ENCOUNTER — Ambulatory Visit (HOSPITAL_COMMUNITY)

## 2024-06-14 ENCOUNTER — Inpatient Hospital Stay

## 2024-06-14 ENCOUNTER — Inpatient Hospital Stay: Admitting: Hematology and Oncology

## 2024-06-15 ENCOUNTER — Inpatient Hospital Stay

## 2024-06-15 ENCOUNTER — Inpatient Hospital Stay: Admitting: Hematology and Oncology

## 2024-07-22 ENCOUNTER — Ambulatory Visit: Admitting: Urology
